# Patient Record
Sex: Female | Born: 1955 | Race: Black or African American | Hispanic: No | Smoking: Former smoker
Health system: Southern US, Community
[De-identification: ages and names within clinical notes are randomized; demographics above are authoritative.]

## PROBLEM LIST (undated history)

## (undated) ENCOUNTER — Inpatient Hospital Stay: Admission: AD | Payer: Medicare Other | Source: Ambulatory Visit | Admitting: Oncology

## (undated) DIAGNOSIS — I700 Atherosclerosis of aorta: Secondary | ICD-10-CM

## (undated) DIAGNOSIS — E113399 Type 2 diabetes mellitus with moderate nonproliferative diabetic retinopathy without macular edema, unspecified eye: Secondary | ICD-10-CM

## (undated) DIAGNOSIS — J441 Chronic obstructive pulmonary disease with (acute) exacerbation: Secondary | ICD-10-CM

## (undated) DIAGNOSIS — J301 Allergic rhinitis due to pollen: Secondary | ICD-10-CM

## (undated) DIAGNOSIS — I5022 Chronic systolic (congestive) heart failure: Secondary | ICD-10-CM

## (undated) DIAGNOSIS — E041 Nontoxic single thyroid nodule: Secondary | ICD-10-CM

## (undated) DIAGNOSIS — D3502 Benign neoplasm of left adrenal gland: Secondary | ICD-10-CM

## (undated) DIAGNOSIS — I2510 Atherosclerotic heart disease of native coronary artery without angina pectoris: Secondary | ICD-10-CM

## (undated) DIAGNOSIS — N814 Uterovaginal prolapse, unspecified: Secondary | ICD-10-CM

## (undated) DIAGNOSIS — Z8673 Personal history of transient ischemic attack (TIA), and cerebral infarction without residual deficits: Secondary | ICD-10-CM

## (undated) DIAGNOSIS — L02211 Cutaneous abscess of abdominal wall: Secondary | ICD-10-CM

## (undated) DIAGNOSIS — L409 Psoriasis, unspecified: Secondary | ICD-10-CM

## (undated) DIAGNOSIS — E663 Overweight: Secondary | ICD-10-CM

## (undated) DIAGNOSIS — K5730 Diverticulosis of large intestine without perforation or abscess without bleeding: Secondary | ICD-10-CM

## (undated) DIAGNOSIS — F17200 Nicotine dependence, unspecified, uncomplicated: Secondary | ICD-10-CM

## (undated) DIAGNOSIS — Z5189 Encounter for other specified aftercare: Secondary | ICD-10-CM

## (undated) DIAGNOSIS — E731 Secondary lactase deficiency: Secondary | ICD-10-CM

## (undated) DIAGNOSIS — E785 Hyperlipidemia, unspecified: Secondary | ICD-10-CM

## (undated) DIAGNOSIS — I10 Essential (primary) hypertension: Secondary | ICD-10-CM

## (undated) DIAGNOSIS — K219 Gastro-esophageal reflux disease without esophagitis: Secondary | ICD-10-CM

## (undated) DIAGNOSIS — K56609 Unspecified intestinal obstruction, unspecified as to partial versus complete obstruction: Secondary | ICD-10-CM

## (undated) HISTORY — DX: Cutaneous abscess of abdominal wall: L02.211

## (undated) HISTORY — DX: Secondary lactase deficiency: E73.1

## (undated) HISTORY — DX: Encounter for other specified aftercare: Z51.89

## (undated) HISTORY — DX: Diverticulosis of large intestine without perforation or abscess without bleeding: K57.30

## (undated) HISTORY — PX: TUBAL LIGATION: SHX77

## (undated) HISTORY — PX: SMALL INTESTINE SURGERY: SHX150

## (undated) HISTORY — DX: Chronic systolic (congestive) heart failure: I50.22

## (undated) HISTORY — DX: Nicotine dependence, unspecified, uncomplicated: F17.200

## (undated) HISTORY — DX: Atherosclerotic heart disease of native coronary artery without angina pectoris: I25.10

## (undated) HISTORY — DX: Atherosclerosis of aorta: I70.0

## (undated) HISTORY — DX: Psoriasis, unspecified: L40.9

## (undated) HISTORY — DX: Type 2 diabetes mellitus with moderate nonproliferative diabetic retinopathy without macular edema, unspecified eye: E11.3399

## (undated) HISTORY — DX: Personal history of transient ischemic attack (TIA), and cerebral infarction without residual deficits: Z86.73

## (undated) HISTORY — DX: Hyperlipidemia, unspecified: E78.5

## (undated) HISTORY — DX: Benign neoplasm of left adrenal gland: D35.02

## (undated) HISTORY — DX: Gastro-esophageal reflux disease without esophagitis: K21.9

## (undated) HISTORY — DX: Allergic rhinitis due to pollen: J30.1

## (undated) HISTORY — DX: Overweight: E66.3

## (undated) HISTORY — DX: Chronic obstructive pulmonary disease with (acute) exacerbation: J44.1

## (undated) HISTORY — DX: Essential (primary) hypertension: I10

---
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
## 2013-05-10 DIAGNOSIS — Z8673 Personal history of transient ischemic attack (TIA), and cerebral infarction without residual deficits: Secondary | ICD-10-CM

## 2013-05-10 HISTORY — DX: Personal history of transient ischemic attack (TIA), and cerebral infarction without residual deficits: Z86.73

## 2013-10-14 ENCOUNTER — Inpatient Hospital Stay (HOSPITAL_COMMUNITY)
Admission: EM | Admit: 2013-10-14 | Discharge: 2013-10-16 | DRG: 391 | Disposition: A | Discharge status: Home / Self Care | Payer: Medicaid Other | Attending: Internal Medicine | Admitting: Internal Medicine

## 2013-10-14 ENCOUNTER — Encounter (HOSPITAL_COMMUNITY): Payer: Self-pay | Admitting: Emergency Medicine

## 2013-10-14 DIAGNOSIS — E119 Type 2 diabetes mellitus without complications: Secondary | ICD-10-CM

## 2013-10-14 DIAGNOSIS — E279 Disorder of adrenal gland, unspecified: Secondary | ICD-10-CM | POA: Diagnosis present

## 2013-10-14 DIAGNOSIS — E860 Dehydration: Secondary | ICD-10-CM

## 2013-10-14 DIAGNOSIS — Z823 Family history of stroke: Secondary | ICD-10-CM

## 2013-10-14 DIAGNOSIS — IMO0002 Reserved for concepts with insufficient information to code with codable children: Secondary | ICD-10-CM

## 2013-10-14 DIAGNOSIS — A088 Other specified intestinal infections: Principal | ICD-10-CM | POA: Diagnosis present

## 2013-10-14 DIAGNOSIS — E43 Unspecified severe protein-calorie malnutrition: Secondary | ICD-10-CM

## 2013-10-14 DIAGNOSIS — I69959 Hemiplegia and hemiparesis following unspecified cerebrovascular disease affecting unspecified side: Secondary | ICD-10-CM

## 2013-10-14 DIAGNOSIS — N179 Acute kidney failure, unspecified: Secondary | ICD-10-CM | POA: Diagnosis present

## 2013-10-14 DIAGNOSIS — E113399 Type 2 diabetes mellitus with moderate nonproliferative diabetic retinopathy without macular edema, unspecified eye: Secondary | ICD-10-CM | POA: Diagnosis present

## 2013-10-14 DIAGNOSIS — R739 Hyperglycemia, unspecified: Secondary | ICD-10-CM

## 2013-10-14 DIAGNOSIS — E278 Other specified disorders of adrenal gland: Secondary | ICD-10-CM

## 2013-10-14 DIAGNOSIS — R109 Unspecified abdominal pain: Secondary | ICD-10-CM

## 2013-10-14 DIAGNOSIS — I10 Essential (primary) hypertension: Secondary | ICD-10-CM

## 2013-10-14 DIAGNOSIS — K529 Noninfective gastroenteritis and colitis, unspecified: Secondary | ICD-10-CM

## 2013-10-14 DIAGNOSIS — E873 Alkalosis: Secondary | ICD-10-CM

## 2013-10-14 DIAGNOSIS — Z794 Long term (current) use of insulin: Secondary | ICD-10-CM

## 2013-10-14 DIAGNOSIS — E785 Hyperlipidemia, unspecified: Secondary | ICD-10-CM | POA: Diagnosis present

## 2013-10-14 DIAGNOSIS — F17200 Nicotine dependence, unspecified, uncomplicated: Secondary | ICD-10-CM | POA: Diagnosis present

## 2013-10-14 DIAGNOSIS — Z8249 Family history of ischemic heart disease and other diseases of the circulatory system: Secondary | ICD-10-CM

## 2013-10-14 DIAGNOSIS — E876 Hypokalemia: Secondary | ICD-10-CM

## 2013-10-14 DIAGNOSIS — A059 Bacterial foodborne intoxication, unspecified: Secondary | ICD-10-CM | POA: Diagnosis present

## 2013-10-14 DIAGNOSIS — Z833 Family history of diabetes mellitus: Secondary | ICD-10-CM

## 2013-10-14 DIAGNOSIS — E871 Hypo-osmolality and hyponatremia: Secondary | ICD-10-CM

## 2013-10-14 DIAGNOSIS — I509 Heart failure, unspecified: Secondary | ICD-10-CM | POA: Diagnosis present

## 2013-10-14 LAB — CBC WITH DIFFERENTIAL/PLATELET
Basophils Absolute: 0 10*3/uL (ref 0.0–0.1)
Basophils Relative: 0 % (ref 0–1)
Eosinophils Absolute: 0 10*3/uL (ref 0.0–0.7)
HCT: 43 % (ref 36.0–46.0)
Hemoglobin: 15.4 g/dL — ABNORMAL HIGH (ref 12.0–15.0)
Lymphocytes Relative: 7 % — ABNORMAL LOW (ref 12–46)
Lymphs Abs: 0.8 10*3/uL (ref 0.7–4.0)
MCH: 28.8 pg (ref 26.0–34.0)
MCHC: 35.8 g/dL (ref 30.0–36.0)
Monocytes Absolute: 0.6 10*3/uL (ref 0.1–1.0)
Monocytes Relative: 5 % (ref 3–12)
Neutro Abs: 9 10*3/uL — ABNORMAL HIGH (ref 1.7–7.7)
Neutrophils Relative %: 87 % — ABNORMAL HIGH (ref 43–77)
WBC: 10.3 10*3/uL (ref 4.0–10.5)

## 2013-10-14 LAB — COMPREHENSIVE METABOLIC PANEL
AST: 17 U/L (ref 0–37)
Albumin: 4.3 g/dL (ref 3.5–5.2)
Alkaline Phosphatase: 71 U/L (ref 39–117)
BUN: 47 mg/dL — ABNORMAL HIGH (ref 6–23)
CO2: 39 mEq/L — ABNORMAL HIGH (ref 19–32)
Calcium: 10.7 mg/dL — ABNORMAL HIGH (ref 8.4–10.5)
Chloride: 76 mEq/L — ABNORMAL LOW (ref 96–112)
GFR calc non Af Amer: 30 mL/min — ABNORMAL LOW (ref >90)
Potassium: 3.6 mEq/L (ref 3.5–5.1)
Total Bilirubin: 0.5 mg/dL (ref 0.3–1.2)

## 2013-10-14 MED ORDER — ONDANSETRON 4 MG PO TBDP
8.0000 mg | ORAL_TABLET | Freq: Once | ORAL | Status: AC
  Administered 2013-10-14: 8 mg via ORAL
  Filled 2013-10-14: qty 2

## 2013-10-14 NOTE — ED Notes (Signed)
Pt states nausea and vomiting starting yesterday. Vomiting x 7 in last 24 hrs. Bilateral lower abdominal pain. LBM 11/19//2014

## 2013-10-14 NOTE — ED Provider Notes (Signed)
CSN: [id number]     Arrival date & time 10/14/13  2051 History   First MD Initiated Contact with Patient 10/14/13 2241     Chief Complaint  Patient presents with  . Emesis   (Consider location/radiation/quality/duration/timing/severity/associated sxs/prior Treatment) HPI Comments: Patient is a 57 yo F PMHx significant for DM, HLD, CHF, CVA, tobacco use presenting to the ED for two days of nausea, several episodes of non-bloody non-bilious emesis, and upper abdominal pain. Patient states that her symptoms began after eating oranges yesterday morning. Patient states her abdominal pain was initially generalized but was able to have a large loose bowel movement while in the emergency department and now her pain is localized to the upper abdomen. She states her last bowel movement was on Wednesday prior to today. She denies any chest pain, SOB, syncope. Patient's only abdominal surgical history includes tubal ligation. Patient is from [place], [place]. Recently moved here, does not have established care.   Patient is a 57 y.o. female presenting with vomiting.  Emesis Associated symptoms: abdominal pain   Associated symptoms: no chills     No past medical history on file. No past surgical history on file. No family history on file. History  Substance Use Topics  . Smoking status: Current Every Day Smoker -- 0.50 packs/day for 25 years    Types: Cigarettes  . Smokeless tobacco: Never Used  . Alcohol Use: No   OB History   Grav Para Term Preterm Abortions TAB SAB Ect Mult Living                 Review of Systems  Constitutional: Negative for fever and chills.  HENT: Negative.   Eyes: Negative.   Respiratory: Negative for shortness of breath.   Cardiovascular: Negative for chest pain.  Gastrointestinal: Positive for nausea, vomiting and abdominal pain.  Genitourinary: Negative.   Musculoskeletal: Negative.   Skin: Negative.   Neurological: Negative.     Allergies  Review of patient's  allergies indicates no known allergies.  Home Medications   Current Outpatient Rx  Name  Route  Sig  Dispense  Refill  . ibuprofen (ADVIL,MOTRIN) 200 MG tablet   Oral   Take 600 mg by mouth every 6 (six) hours as needed for mild pain.         Marland Kitchen insulin aspart (NOVOLOG) 100 UNIT/ML injection   Subcutaneous   Inject 4.5 Units into the skin at bedtime. Prescribed 3 times daily, but takes only once because she is afraid of running out         . insulin glargine (LANTUS) 100 UNIT/ML injection   Subcutaneous   Inject 5 Units into the skin at bedtime.          BP 111/61  Pulse 99  Temp(Src) 97.5 F (36.4 C) (Oral)  Resp 18  SpO2 97% Physical Exam  Constitutional: She is oriented to person, place, and time. She appears well-developed and well-nourished. No distress.  HENT:  Head: Normocephalic and atraumatic.  Right Ear: External ear normal.  Left Ear: External ear normal.  Nose: Nose normal.  Eyes: Conjunctivae are normal.  Neck: Neck supple.  Cardiovascular: Normal rate, regular rhythm and normal heart sounds.   Pulmonary/Chest: Effort normal and breath sounds normal.  Abdominal: Soft. Bowel sounds are normal. She exhibits no distension. There is tenderness in the right upper quadrant, epigastric area and left upper quadrant. There is no rigidity, no rebound, no guarding and no CVA tenderness.    Musculoskeletal: She exhibits  no edema.  Neurological: She is alert and oriented to person, place, and time.  Skin: Skin is warm and dry. She is not diaphoretic.    ED Course  Procedures (including critical care time) Medications  0.9 %  sodium chloride infusion (not administered)    Followed by  0.9 %  sodium chloride infusion (not administered)    Followed by  0.9 %  sodium chloride infusion (not administered)  insulin regular (NOVOLIN R,HUMULIN R) 1 Units/mL in sodium chloride 0.9 % 100 mL infusion (3.1 Units/hr Intravenous New Bag/Given 10/15/13 0107)  dextrose 5  %-0.45 % sodium chloride infusion (not administered)  ondansetron (ZOFRAN-ODT) disintegrating tablet 8 mg (8 mg Oral Given 10/14/13 2245)  sodium chloride 0.9 % bolus 1,000 mL (1,000 mLs Intravenous New Bag/Given 10/15/13 0107)    Labs Review Labs Reviewed  CBC WITH DIFFERENTIAL - Abnormal; Notable for the following:    RBC 5.34 (*)    Hemoglobin 15.4 (*)    Neutrophils Relative % 87 (*)    Neutro Abs 9.0 (*)    Lymphocytes Relative 7 (*)    All other components within normal limits  COMPREHENSIVE METABOLIC PANEL - Abnormal; Notable for the following:    Sodium 130 (*)    Chloride 76 (*)    CO2 39 (*)    Glucose, Bld 371 (*)    BUN 47 (*)    Creatinine, Ser 1.83 (*)    Calcium 10.7 (*)    GFR calc non Af Amer 30 (*)    GFR calc Af Amer 35 (*)    All other components within normal limits  LIPASE, BLOOD  TROPONIN I  URINALYSIS, ROUTINE W REFLEX MICROSCOPIC  KETONES, QUALITATIVE   Imaging Review Ct Abdomen Pelvis Wo Contrast  10/15/2013   CLINICAL DATA:  Nausea, vomiting, bilateral lower abdomen pain  EXAM: CT ABDOMEN AND PELVIS WITHOUT CONTRAST  TECHNIQUE: Multidetector CT imaging of the abdomen and pelvis was performed following the standard protocol without intravenous contrast.  COMPARISON:  None.  FINDINGS: There is no nephrolithiasis or hydroureteronephrosis bilaterally. The liver, spleen, gallbladder, pancreas are normal. The right adrenal gland is normal. There is a 1.6 x 2.8 cm mass in the left adrenal gland with Hounsfield unit 29, not typical for adrenal adenoma. There is atherosclerosis of the abdominal aorta without aneurysmal dilatation. There is no abdominal lymphadenopathy. There dilated small bowel loops in the abdomen and pelvis. There is diverticulosis of colon without diverticulitis.  Fluid-filled bladder is normal. The uterus is normal. Small amount of free fluid is noted in the pelvis. No focal pneumonia or pleural effusion is identified in the visualized lung  bases. Degenerative joint changes of the spine are noted.  IMPRESSION: Dilated small bowel loops in the abdomen and pelvis. Findings could be due to ileus. Followup abdominal x-ray is recommended.  Mass in the left adrenal gland not typical for adrenal adenoma. If clinically indicated, evaluation with MRI may be helpful.   Electronically Signed   By: Sherian Rein M.D.   On: 10/15/2013 00:40    EKG Interpretation    Date/Time:  Sunday October 15 2013 00:27:57 EST Ventricular Rate:  99 PR Interval:  113 QRS Duration: 96 QT Interval:  381 QTC Calculation: 489 R Axis:   23 Text Interpretation:  Age not entered, assumed to be  57 years old for purpose of ECG interpretation Sinus rhythm Biatrial enlargement RSR' in V1 or V2, right VCD or RVH LVH with secondary repolarization abnormality Abnormal T, consider  ischemia, inferior leads No previous ECGs available Confirmed by [facility]  MD, JOHN ([id number]) on 10/15/2013 12:47:00 AM            MDM   1. Dehydration   2. Abdominal pain   3. Metabolic alkalosis   4. Hyperglycemia     Afebrile, NAD, non-toxic appearing, AAOx4. RUQ and epigastric tenderness. Metabolic alkalosis noted on laboratory findings. EKG reviewed, T wave abnormalities but negative troponin no previous EKG review. CT scan reviewed patient will require MRI for further evaluation of adrenal mass. I have reviewed nursing notes, vital signs, and all appropriate lab and imaging results for this patient. Patient will be admitted for observation. Patient d/w with Dr. Fonnie Jarvis, agrees with plan.         Jeannetta Ellis, PA-C 10/15/13 0112

## 2013-10-14 NOTE — ED Notes (Signed)
Pt had severe episode of uncontrolled diarrhea. Pt changed and cleaned. Linens changed as well.

## 2013-10-15 ENCOUNTER — Emergency Department (HOSPITAL_COMMUNITY): Payer: Medicaid Other

## 2013-10-15 ENCOUNTER — Encounter (HOSPITAL_COMMUNITY): Payer: Self-pay | Admitting: Internal Medicine

## 2013-10-15 DIAGNOSIS — E876 Hypokalemia: Secondary | ICD-10-CM

## 2013-10-15 DIAGNOSIS — E873 Alkalosis: Secondary | ICD-10-CM

## 2013-10-15 DIAGNOSIS — R109 Unspecified abdominal pain: Secondary | ICD-10-CM

## 2013-10-15 DIAGNOSIS — I10 Essential (primary) hypertension: Secondary | ICD-10-CM

## 2013-10-15 DIAGNOSIS — E860 Dehydration: Secondary | ICD-10-CM | POA: Diagnosis present

## 2013-10-15 DIAGNOSIS — R7309 Other abnormal glucose: Secondary | ICD-10-CM

## 2013-10-15 DIAGNOSIS — E113399 Type 2 diabetes mellitus with moderate nonproliferative diabetic retinopathy without macular edema, unspecified eye: Secondary | ICD-10-CM | POA: Diagnosis present

## 2013-10-15 DIAGNOSIS — E119 Type 2 diabetes mellitus without complications: Secondary | ICD-10-CM

## 2013-10-15 DIAGNOSIS — K5289 Other specified noninfective gastroenteritis and colitis: Secondary | ICD-10-CM

## 2013-10-15 DIAGNOSIS — E871 Hypo-osmolality and hyponatremia: Secondary | ICD-10-CM | POA: Diagnosis present

## 2013-10-15 DIAGNOSIS — R739 Hyperglycemia, unspecified: Secondary | ICD-10-CM | POA: Diagnosis present

## 2013-10-15 DIAGNOSIS — K529 Noninfective gastroenteritis and colitis, unspecified: Secondary | ICD-10-CM | POA: Diagnosis present

## 2013-10-15 HISTORY — DX: Essential (primary) hypertension: I10

## 2013-10-15 HISTORY — DX: Type 2 diabetes mellitus with moderate nonproliferative diabetic retinopathy without macular edema, unspecified eye: E11.3399

## 2013-10-15 LAB — URINALYSIS, ROUTINE W REFLEX MICROSCOPIC
Glucose, UA: NEGATIVE mg/dL
Hgb urine dipstick: NEGATIVE
Ketones, ur: NEGATIVE mg/dL
Protein, ur: 100 mg/dL — AB
Urobilinogen, UA: 1 mg/dL (ref 0.0–1.0)
pH: 6.5 (ref 5.0–8.0)

## 2013-10-15 LAB — BASIC METABOLIC PANEL
BUN: 48 mg/dL — ABNORMAL HIGH (ref 6–23)
CO2: 38 mEq/L — ABNORMAL HIGH (ref 19–32)
Calcium: 9.4 mg/dL (ref 8.4–10.5)
GFR calc non Af Amer: 38 mL/min — ABNORMAL LOW (ref >90)
Glucose, Bld: 88 mg/dL (ref 70–99)
Potassium: 2.8 mEq/L — ABNORMAL LOW (ref 3.5–5.1)

## 2013-10-15 LAB — CBC
Hemoglobin: 14 g/dL (ref 12.0–15.0)
MCH: 28.2 pg (ref 26.0–34.0)
MCHC: 34.6 g/dL (ref 30.0–36.0)
MCV: 81.7 fL (ref 78.0–100.0)
Platelets: 285 10*3/uL (ref 150–400)
RBC: 4.96 MIL/uL (ref 3.87–5.11)

## 2013-10-15 LAB — GLUCOSE, CAPILLARY
Glucose-Capillary: 131 mg/dL — ABNORMAL HIGH (ref 70–99)
Glucose-Capillary: 152 mg/dL — ABNORMAL HIGH (ref 70–99)

## 2013-10-15 LAB — CLOSTRIDIUM DIFFICILE BY PCR: Toxigenic C. Difficile by PCR: NEGATIVE

## 2013-10-15 LAB — URINE MICROSCOPIC-ADD ON

## 2013-10-15 LAB — MAGNESIUM: Magnesium: 2.1 mg/dL (ref 1.5–2.5)

## 2013-10-15 MED ORDER — ACETAMINOPHEN 650 MG RE SUPP: 650.0000 mg | Freq: Four times a day (QID) | RECTAL | Status: DC | PRN

## 2013-10-15 MED ORDER — ZOLPIDEM TARTRATE 5 MG PO TABS: 5.0000 mg | ORAL_TABLET | Freq: Every evening | ORAL | Status: DC | PRN

## 2013-10-15 MED ORDER — INSULIN ASPART 100 UNIT/ML ~~LOC~~ SOLN
0.0000 [IU] | SUBCUTANEOUS | Status: DC
  Administered 2013-10-15: 1 [IU] via SUBCUTANEOUS

## 2013-10-15 MED ORDER — HYDROMORPHONE HCL PF 1 MG/ML IJ SOLN: 0.5000 mg | INTRAMUSCULAR | Status: DC | PRN

## 2013-10-15 MED ORDER — SODIUM CHLORIDE 0.9 % IV SOLN: 1000.0000 mL | INTRAVENOUS | Status: DC

## 2013-10-15 MED ORDER — SODIUM CHLORIDE 0.9 % IV SOLN: 1000.0000 mL | Freq: Once | INTRAVENOUS | Status: DC

## 2013-10-15 MED ORDER — INSULIN ASPART 100 UNIT/ML ~~LOC~~ SOLN
0.0000 [IU] | Freq: Three times a day (TID) | SUBCUTANEOUS | Status: DC
  Administered 2013-10-15 (×2): 2 [IU] via SUBCUTANEOUS
  Administered 2013-10-16 (×2): 1 [IU] via SUBCUTANEOUS

## 2013-10-15 MED ORDER — OXYCODONE HCL 5 MG PO TABS: 5.0000 mg | ORAL_TABLET | ORAL | Status: DC | PRN

## 2013-10-15 MED ORDER — ONDANSETRON HCL 4 MG PO TABS: 4.0000 mg | ORAL_TABLET | Freq: Four times a day (QID) | ORAL | Status: DC | PRN

## 2013-10-15 MED ORDER — INFLUENZA VAC SPLIT QUAD 0.5 ML IM SUSP
0.5000 mL | INTRAMUSCULAR | Status: AC
  Administered 2013-10-16: 0.5 mL via INTRAMUSCULAR
  Filled 2013-10-15 (×2): qty 0.5

## 2013-10-15 MED ORDER — INSULIN REGULAR HUMAN 100 UNIT/ML IJ SOLN
INTRAMUSCULAR | Status: DC
  Administered 2013-10-15: 3.1 [IU]/h via INTRAVENOUS
  Filled 2013-10-15: qty 1

## 2013-10-15 MED ORDER — SODIUM CHLORIDE 0.9 % IV BOLUS (SEPSIS)
1000.0000 mL | Freq: Once | INTRAVENOUS | Status: AC
  Administered 2013-10-15: 1000 mL via INTRAVENOUS

## 2013-10-15 MED ORDER — DEXTROSE-NACL 5-0.45 % IV SOLN: INTRAVENOUS | Status: DC

## 2013-10-15 MED ORDER — ACETAMINOPHEN 325 MG PO TABS: 650.0000 mg | ORAL_TABLET | Freq: Four times a day (QID) | ORAL | Status: DC | PRN

## 2013-10-15 MED ORDER — ONDANSETRON HCL 4 MG/2ML IJ SOLN: 4.0000 mg | Freq: Four times a day (QID) | INTRAMUSCULAR | Status: DC | PRN

## 2013-10-15 MED ORDER — POTASSIUM CHLORIDE 10 MEQ/100ML IV SOLN
10.0000 meq | INTRAVENOUS | Status: AC
  Administered 2013-10-15 (×2): 10 meq via INTRAVENOUS
  Filled 2013-10-15 (×2): qty 100

## 2013-10-15 MED ORDER — ENOXAPARIN SODIUM 40 MG/0.4ML ~~LOC~~ SOLN
40.0000 mg | SUBCUTANEOUS | Status: DC
  Administered 2013-10-16: 40 mg via SUBCUTANEOUS
  Filled 2013-10-15 (×2): qty 0.4

## 2013-10-15 MED ORDER — ALUM & MAG HYDROXIDE-SIMETH 200-200-20 MG/5ML PO SUSP: 30.0000 mL | Freq: Four times a day (QID) | ORAL | Status: DC | PRN

## 2013-10-15 MED ORDER — INSULIN GLARGINE 100 UNIT/ML ~~LOC~~ SOLN
5.0000 [IU] | Freq: Every day | SUBCUTANEOUS | Status: DC
  Administered 2013-10-15 (×2): 5 [IU] via SUBCUTANEOUS
  Filled 2013-10-15 (×3): qty 0.05

## 2013-10-15 MED ORDER — ENOXAPARIN SODIUM 30 MG/0.3ML ~~LOC~~ SOLN
30.0000 mg | SUBCUTANEOUS | Status: DC
  Administered 2013-10-15: 30 mg via SUBCUTANEOUS
  Filled 2013-10-15 (×2): qty 0.3

## 2013-10-15 MED ORDER — SODIUM CHLORIDE 0.9 % IV SOLN
INTRAVENOUS | Status: DC
  Administered 2013-10-15 – 2013-10-16 (×3): via INTRAVENOUS

## 2013-10-15 MED ORDER — POTASSIUM CHLORIDE CRYS ER 20 MEQ PO TBCR
40.0000 meq | EXTENDED_RELEASE_TABLET | Freq: Two times a day (BID) | ORAL | Status: AC
  Administered 2013-10-15 (×2): 40 meq via ORAL
  Filled 2013-10-15 (×2): qty 2

## 2013-10-15 NOTE — Progress Notes (Addendum)
[facility] PROGRESS NOTE  Alicia [name] ZOX:[id number] DOB: May 25, 1956 DOA: 10/14/2013 PCP: Provider Not In System  Assessment/Plan  Nausea, vomiting, and diarrhea, possibly secondary to gastroenteritis, either viral or bacterial, resolving -  C. Diff neg -  Stool culture pending -  Continue IVF -  F/u urine culture  AKI, likely secondary to dehydration, BUN and creatinine trending down, unknown baseline -  Continue IVF -  Continue to hold ACEI -  Minimize nephrotoxins   T2DM, CBG well controlled, A1c 9.1 -  Continue Lantus 5 units with low dose SSI  Hyponatremia resolved with hydration Hypokalemia, likely due to vomiting and diarrhea, oral and IV KCl.  Magnesium wnl Elevated bicarb, may be due to dehydration and profuse vomiting, no underlying respiratory disease - continue hydration  HTN, BP stable.   -  Continue to hold ACEI   CHF with unknown etiology and EF.  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   -  Follow up labs and MRI as outpatient   Diet:  Advance as tolerated Access:  PIV IVF:  yes Proph:  lovenox  Code Status: full Family Communication: patient  Disposition Plan: pending further improvement in AKI and diarrhea, tolerating liquids/foods okay   Consultants:  none  Procedures:  CT abd/pelvis  Antibiotics:  none   HPI/Subjective:  Abdominal pain already somewhat better.  Nausea also improved.  Still having diarrhea, but may be becoming more formed   Objective: Filed Vitals:   10/15/13 0134 10/15/13 0244 10/15/13 0810 10/15/13 1415  BP:  123/81 125/80 135/77  Pulse:  100 90 86  Temp: 98.4 F (36.9 C) 97.3 F (36.3 C) 98.3 F (36.8 C) 98.5 F (36.9 C)  TempSrc: Oral Oral Oral Oral  Resp:  18 18 18   Height: 5\' 7"  (1.702 m)     Weight:  56.337 kg (124 lb 3.2  oz)    SpO2:  93% 95% 96%    Intake/Output Summary (Last 24 hours) at 10/15/13 1610 Last data filed at 10/15/13 1519  Gross per 24 hour  Intake   1880 ml  Output    255 ml  Net   1625 ml   Filed Weights   10/15/13 0244  Weight: 56.337 kg (124 lb 3.2 oz)    Exam:   General:  AAF, No acute distress  HEENT:  NCAT, MMM  Cardiovascular:  RRR, nl S1, S2 no mrg, 2+ pulses, warm extremities  Respiratory:  CTAB, no increased WOB  Abdomen:   NABS, soft, mildly distended, mild TTP diffusely without rebound or guarding  MSK:   Normal tone and bulk, no LEE  Neuro:  Right hemiplegia  Data Reviewed: Basic Metabolic Panel:  Recent Labs Lab 10/14/13 2140 10/15/13 0620  NA 130* 138  K 3.6 2.8*  CL 76* 88*  CO2 39* 38*  GLUCOSE 371* 88  BUN 47* 48*  CREATININE 1.83* 1.50*  CALCIUM 10.7* 9.4  MG  --  2.1   Liver Function Tests:  Recent Labs Lab 10/14/13 2140  AST 17  ALT 12  ALKPHOS 71  BILITOT 0.5  PROT 8.1  ALBUMIN 4.3    Recent Labs Lab 10/14/13 2140  LIPASE 13   No results found for this basename: AMMONIA,  in the last 168 hours CBC:  Recent Labs Lab 10/14/13 2140  10/15/13 0620  WBC 10.3 9.0  NEUTROABS 9.0*  --   HGB 15.4* 14.0  HCT 43.0 40.5  MCV 80.5 81.7  PLT 299 285   Cardiac Enzymes:  Recent Labs Lab 10/14/13 2330  TROPONINI <0.30   BNP (last 3 results) No results found for this basename: PROBNP,  in the last 8760 hours CBG:  Recent Labs Lab 10/15/13 0251 10/15/13 0730 10/15/13 1117  GLUCAP 131* 111* 152*    Recent Results (from the past 240 hour(s))  CLOSTRIDIUM DIFFICILE BY PCR     Status: None   Collection Time    10/15/13  7:45 AM      Result Value Range Status   C difficile by pcr NEGATIVE  NEGATIVE Final     Studies: Ct Abdomen Pelvis Wo Contrast  10/15/2013   CLINICAL DATA:  Nausea, vomiting, bilateral lower abdomen pain  EXAM: CT ABDOMEN AND PELVIS WITHOUT CONTRAST  TECHNIQUE: Multidetector CT imaging of the  abdomen and pelvis was performed following the standard protocol without intravenous contrast.  COMPARISON:  None.  FINDINGS: There is no nephrolithiasis or hydroureteronephrosis bilaterally. The liver, spleen, gallbladder, pancreas are normal. The right adrenal gland is normal. There is a 1.6 x 2.8 cm mass in the left adrenal gland with Hounsfield unit 29, not typical for adrenal adenoma. There is atherosclerosis of the abdominal aorta without aneurysmal dilatation. There is no abdominal lymphadenopathy. There dilated small bowel loops in the abdomen and pelvis. There is diverticulosis of colon without diverticulitis.  Fluid-filled bladder is normal. The uterus is normal. Small amount of free fluid is noted in the pelvis. No focal pneumonia or pleural effusion is identified in the visualized lung bases. Degenerative joint changes of the spine are noted.  IMPRESSION: Dilated small bowel loops in the abdomen and pelvis. Findings could be due to ileus. Followup abdominal x-ray is recommended.  Mass in the left adrenal gland not typical for adrenal adenoma. If clinically indicated, evaluation with MRI may be helpful.   Electronically Signed   By: Sherian Rein M.D.   On: 10/15/2013 00:40    Scheduled Meds: . enoxaparin (LOVENOX) injection  30 mg Subcutaneous Q24H  . [START ON 10/16/2013] influenza vac split quadrivalent PF  0.5 mL Intramuscular Tomorrow-1000  . insulin aspart  0-9 Units Subcutaneous TID WC  . insulin glargine  5 Units Subcutaneous QHS  . potassium chloride  40 mEq Oral BID   Continuous Infusions: . sodium chloride 100 mL/hr at 10/15/13 0300    Principal Problem:   Dehydration Active Problems:   Gastroenteritis, acute   Hyperglycemia   Hyponatremia   Diabetes mellitus, type 2   Hypertension    Time spent: 30 min    Pike Scantlebury, [facility]  [facility] Pager [phone]. If 7PM-7AM, please contact night-coverage at [email or web address], password [facility] 10/15/2013, 4:10 PM  LOS: 1 day

## 2013-10-15 NOTE — Progress Notes (Signed)
Called ED spoke with Dessie Coma, RN unavailable to give report. Advised to call back.

## 2013-10-15 NOTE — ED Notes (Signed)
Patient shivering. Temp is 98.4. Blankets given

## 2013-10-15 NOTE — ED Provider Notes (Signed)
Medical screening examination/treatment/procedure(s) were conducted as a shared visit with non-physician practitioner(s) and myself.  I personally evaluated the patient during the encounter.  EKG Interpretation    Date/Time:  Sunday October 15 2013 00:27:57 EST Ventricular Rate:  99 PR Interval:  113 QRS Duration: 96 QT Interval:  381 QTC Calculation: 489 R Axis:   23 Text Interpretation:  Age not entered, assumed to be  57 years old for purpose of ECG interpretation Sinus rhythm Biatrial enlargement RSR' in V1 or V2, right VCD or RVH LVH with secondary repolarization abnormality Abnormal T, consider ischemia, inferior leads No previous ECGs available Confirmed by [facility]  MD, Ronita Hargreaves ([id number]) on 10/15/2013 12:47:00 AM           NVD x 2 days, Pt feels dehydrated, has hyperglycemia, acute renal insufficiency (no PMH of CKD), suspect mild DKA.  Hurman Horn, MD 10/15/13 1302

## 2013-10-15 NOTE — H&P (Addendum)
[facility] History and Physical  Rhaya May Anderle ZOX:[id number] DOB: 1956/04/01 DOA: 10/14/2013  Referring physician:   EDP PCP: Provider Not In System  Specialists:   Chief Complaint:  Nausea and Vomiting since yesterday now Diarrhea  HPI: Alicia [name] is a 57 y.o. female with a hx of Type II Diabetes who presents to the ED with complaints of nausea and vomiting since yesterday afternoon, a few hours after eating questionably bad oranges.  She and her grand-daughter a 62 year old became sick.  She report that she has continued to have N+V , and then PTA she began to have diarrhea.  She reports having 4 episodes of diarrhea since coming into the ED.  She reports having fevers and chills, and lower ABD Pain.     Review of Systems: The patient denies anorexia, headaches, weight loss,, vision loss, diplopia, dizziness, decreased hearing, rhinitis, hoarseness, chest pain, syncope, dyspnea on exertion, peripheral edema, balance deficits, cough, hemoptysis, constipation, hematemesis, melena, hematochezia, severe indigestion/heartburn, dysuria, hematuria, incontinence, suspicious skin lesions, transient blindness, difficulty walking, depression, unusual weight change, abnormal bleeding, enlarged lymph nodes, angioedema, and breast masses.    Past Medical History  Diagnosis Date  . Diabetes mellitus, type II   . Hypertension   . H/O: CVA (cerebrovascular accident)   . CHF (congestive heart failure)     Past Surgical History  Procedure Laterality Date  . Tubal ligation    . Cardiac catheterization      Prior to Admission medications   Medication Sig Start Date End Date Taking? Authorizing Provider  ibuprofen (ADVIL,MOTRIN) 200 MG tablet Take 600 mg by mouth every 6 (six) hours as needed for mild pain.   Yes Historical Provider, MD  insulin aspart (NOVOLOG) 100 UNIT/ML injection Inject 4.5 Units into the skin at bedtime. Prescribed 3 times daily, but takes only once because she is  afraid of running out   Yes Historical Provider, MD  insulin glargine (LANTUS) 100 UNIT/ML injection Inject 5 Units into the skin at bedtime.   Yes Historical Provider, MD    No Known Allergies  Social History:  From [place] , recently moved to [facility].     reports that she has been smoking Cigarettes.  She has a 12.5 pack-year smoking history. She has never used smokeless tobacco. She reports that she does not drink alcohol or use illicit drugs.     Family History  Problem Relation Age of Onset  . Diabetes Mellitus II Mother   . Hypertension Mother   . CVA Mother        Physical Exam:  GEN:  Pleasant  Mildly Overweight 57 y.o.  African American female  examined  and in no acute distress; cooperative with exam Filed Vitals:   10/14/13 2102 10/15/13 0000 10/15/13 0130 10/15/13 0134  BP: 99/71 111/61 157/101   Pulse: 115 99 101   Temp: 97.5 F (36.4 C)   98.4 F (36.9 C)  TempSrc: Oral   Oral  Resp: 18 18 18    SpO2: 95% 97% 100%    Blood pressure 157/101, pulse 101, temperature 98.4 F (36.9 C), temperature source Oral, resp. rate 18, SpO2 100.00%. PSYCH: SHe is alert and oriented x4; does not appear anxious does not appear depressed; affect is normal HEENT: Normocephalic and Atraumatic, Mucous membranes pink; PERRLA; EOM intact; Fundi:  Benign;  No scleral icterus, Nares: Patent, Oropharynx: Clear, Fair Dentition, Neck:  FROM, no cervical lymphadenopathy nor thyromegaly or carotid bruit; no JVD; Breasts:: Not examined  CHEST WALL: No tenderness CHEST: Normal respiration, clear to auscultation bilaterally HEART: Regular rate and rhythm; no murmurs rubs or gallops BACK: No kyphosis or scoliosis; no CVA tenderness ABDOMEN: Positive Bowel Sounds, Obese, soft non-tender; no masses, no organomegaly. Rectal Exam: Not done EXTREMITIES: No cyanosis, clubbing or edema; no ulcerations. Genitalia: not examined PULSES: 2+ and symmetric SKIN: Normal hydration no rash or  ulceration CNS: Cranial nerves 2-12 grossly intact no focal neurologic deficit    Labs on Admission:  Basic Metabolic Panel:  Recent Labs Lab 10/14/13 2140  NA 130*  K 3.6  CL 76*  CO2 39*  GLUCOSE 371*  BUN 47*  CREATININE 1.83*  CALCIUM 10.7*   Liver Function Tests:  Recent Labs Lab 10/14/13 2140  AST 17  ALT 12  ALKPHOS 71  BILITOT 0.5  PROT 8.1  ALBUMIN 4.3    Recent Labs Lab 10/14/13 2140  LIPASE 13   No results found for this basename: AMMONIA,  in the last 168 hours CBC:  Recent Labs Lab 10/14/13 2140  WBC 10.3  NEUTROABS 9.0*  HGB 15.4*  HCT 43.0  MCV 80.5  PLT 299   Cardiac Enzymes:  Recent Labs Lab 10/14/13 2330  TROPONINI <0.30    BNP (last 3 results) No results found for this basename: PROBNP,  in the last 8760 hours CBG: No results found for this basename: GLUCAP,  in the last 168 hours  Radiological Exams on Admission: Ct Abdomen Pelvis Wo Contrast  10/15/2013   CLINICAL DATA:  Nausea, vomiting, bilateral lower abdomen pain  EXAM: CT ABDOMEN AND PELVIS WITHOUT CONTRAST  TECHNIQUE: Multidetector CT imaging of the abdomen and pelvis was performed following the standard protocol without intravenous contrast.  COMPARISON:  None.  FINDINGS: There is no nephrolithiasis or hydroureteronephrosis bilaterally. The liver, spleen, gallbladder, pancreas are normal. The right adrenal gland is normal. There is a 1.6 x 2.8 cm mass in the left adrenal gland with Hounsfield unit 29, not typical for adrenal adenoma. There is atherosclerosis of the abdominal aorta without aneurysmal dilatation. There is no abdominal lymphadenopathy. There dilated small bowel loops in the abdomen and pelvis. There is diverticulosis of colon without diverticulitis.  Fluid-filled bladder is normal. The uterus is normal. Small amount of free fluid is noted in the pelvis. No focal pneumonia or pleural effusion is identified in the visualized lung bases. Degenerative joint  changes of the spine are noted.  IMPRESSION: Dilated small bowel loops in the abdomen and pelvis. Findings could be due to ileus. Followup abdominal x-ray is recommended.  Mass in the left adrenal gland not typical for adrenal adenoma. If clinically indicated, evaluation with MRI may be helpful.   Electronically Signed   By: Sherian Rein M.D.   On: 10/15/2013 00:40      Assessment/Plan Principal Problem:   Dehydration Active Problems:   Gastroenteritis, acute   Hyperglycemia   Hyponatremia   Diabetes mellitus, type 2   Hypertension   Ileus   1.  Dehydration- from acute Gastroenteritis,  IVFs ordered and monitor Electrolytes and BUN/Cr.    2.  Acute Gastroenteritis and Ileus-  Supportive Care, IVFs, and Anti-emetics PRN, Send Stool for C+S and C.diff PCR.    3.  Hyperglycemia with DM2-  Continue Lantus Insulin , add SSI coverage.  Check HbA1C.    4.  Hyponatremia-  IVFs with NSS, MOnitor Na+ levels.    5.  HTN-  Monitor BPs and initiate Rx, Ace Inhibitor if BUN/Cr improves.  6.   DVT prophylaxis with Lovenox.      Code Status:    FULL CODE Family Communication:    No Family Present Disposition Plan:     Inpatient    Time spent:  37 Minutes  Ron Parker [facility] Pager [phone]  If 7PM-7AM, please contact night-coverage [email or web address] Password [email or web address] 10/15/2013, 2:03 AM

## 2013-10-15 NOTE — Progress Notes (Signed)
Called and spoke with ED RN, report given & all questions answered.

## 2013-10-16 DIAGNOSIS — K5289 Other specified noninfective gastroenteritis and colitis: Secondary | ICD-10-CM

## 2013-10-16 DIAGNOSIS — E43 Unspecified severe protein-calorie malnutrition: Secondary | ICD-10-CM | POA: Insufficient documentation

## 2013-10-16 DIAGNOSIS — E278 Other specified disorders of adrenal gland: Secondary | ICD-10-CM

## 2013-10-16 DIAGNOSIS — E119 Type 2 diabetes mellitus without complications: Secondary | ICD-10-CM

## 2013-10-16 DIAGNOSIS — E279 Disorder of adrenal gland, unspecified: Secondary | ICD-10-CM

## 2013-10-16 LAB — BASIC METABOLIC PANEL
BUN: 28 mg/dL — ABNORMAL HIGH (ref 6–23)
CO2: 33 mEq/L — ABNORMAL HIGH (ref 19–32)
Calcium: 9.3 mg/dL (ref 8.4–10.5)
Chloride: 94 mEq/L — ABNORMAL LOW (ref 96–112)
Creatinine, Ser: 0.61 mg/dL (ref 0.50–1.10)
GFR calc Af Amer: >90 mL/min (ref >90)
GFR calc non Af Amer: >90 mL/min (ref >90)
Glucose, Bld: 128 mg/dL — ABNORMAL HIGH (ref 70–99)
Potassium: 4.4 mEq/L (ref 3.5–5.1)
Sodium: 134 mEq/L — ABNORMAL LOW (ref 135–145)

## 2013-10-16 LAB — GLUCOSE, CAPILLARY
Glucose-Capillary: 137 mg/dL — ABNORMAL HIGH (ref 70–99)
Glucose-Capillary: 134 mg/dL — ABNORMAL HIGH (ref 70–99)

## 2013-10-16 MED ORDER — PROMETHAZINE HCL 12.5 MG PO TABS: 12.5000 mg | ORAL_TABLET | Freq: Four times a day (QID) | ORAL | Status: DC | PRN

## 2013-10-16 NOTE — Progress Notes (Signed)
   CARE MANAGEMENT NOTE 10/16/2013  Patient:  Alicia [name], Alicia [name]   Account Number:  [id number]  Date Initiated:  10/16/2013  Documentation initiated by:  Darlyne Russian  Subjective/Objective Assessment:   admitted with c/o nausea, vomiting  self-pay, no PCP.     Action/Plan:   Progression of care and discharge planning.  Schedule f/u appointment.   Anticipated DC Date:  10/16/2013   Anticipated DC Plan:  HOME/SELF CARE      DC Planning Services  CM consult      Choice offered to / List presented to:             Status of service:  Completed, signed off Medicare Important Message given?   (If response is "NO", the following Medicare IM given date fields will be blank) Date Medicare IM given:   Date Additional Medicare IM given:    Discharge Disposition:  HOME/SELF CARE  Per UR Regulation:    If discussed at Long Length of Stay Meetings, dates discussed:    Comments:  10/16/2013  [address] RN, [place]  [phone] CM referral:  need PCP  spoke with patient regarding medical follow up post discharge. She is new to the area, staying with her daughter, does not have an MD in the area.  Verified contact #:  [phone]  [facility] [phone] left voice mail message requesting f/u appointment.

## 2013-10-16 NOTE — Progress Notes (Signed)
OT Cancellation Note and Discharge  Patient Details Name: Alicia [name] MRN: [id number] DOB: 16-Nov-1956   Cancelled Treatment:    Reason Eval/Treat Not Completed: OT screened, no needs identified, will sign off. 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 No OT needs identified, we will sign off.  Evette Georges [phone] 10/16/2013, 2:05 PM

## 2013-10-16 NOTE — Discharge Summary (Addendum)
Physician Discharge Summary  [facility] May Leidy MVH:[id number] DOB: 16-Jun-1956 DOA: 10/14/2013  PCP: Provider Not In System  Admit date: 10/14/2013 Discharge date: 10/16/2013  Recommendations for Outpatient Follow-up:  1. Follow up with primary care doctor in 1 week of discharge for repeat bloodwork and to follow up on the pending urine culture and stool culture.  Repeat BMP to ensure kidney function is stable.   2. Left adrenal mass needs further work up for malignancy, including MRI.  Further work up per PCP.     Discharge Diagnoses:  Principal Problem:   Gastroenteritis, acute Active Problems:   Dehydration   Hyperglycemia   Hyponatremia   Diabetes mellitus, type 2   Hypertension   Hypokalemia   Protein-calorie malnutrition, severe   Mass of left adrenal gland   Discharge Condition: stable, improved  Diet recommendation: diabetic  Wt Readings from Last 3 Encounters:  10/15/13 58.469 kg (128 lb 14.4 oz)    History of present illness:  Alicia [name] is a 57 y.o. female with a hx of Type II Diabetes who presents to the ED with complaints of nausea and vomiting since yesterday afternoon, a few hours after eating questionably bad oranges. She and her grand-daughter a 60 year old became sick. She report that she has continued to have N+V , and then PTA she began to have diarrhea. She reports having 4 episodes of diarrhea since coming into the ED. She reports having fevers and chills, and lower ABD Pain.   Hospital Course:   Gastroenteritis:  Alicia [name] presented with nausea, vomiting, and diarrhea and had been exposed to some bad oranges and a child sick with the same symptoms.  She most likely has viral gastroenteritis or food poisoning.  Her CT demonstrated dilated small bowel loops and an incidental mass in the left adrenal gland. She was dehydrated and given zofran and IV fluids.  Her liver function tests and lipase were normal.  Her UA demonstrated SB 1.020, protein 100, mod  LE, 11-20 WBC, many bacteria, and hyaline casts, however, she denied dysuria, urinary frequency or urgency.  She was not started on empiric antibiotics and her urine culture is pending.  Despite not starting antibiotics, she has clinically improved.  Her diarrhea and nausea have resolved and she was tolerated food and liquid prior to discharge.  Her abdominal pain is almost completely resolved.    AKI, likely secondary to dehydration, baseline creatinine near 0.6 and presented with creatinine of 1.83.  She was given IVF and her creatinine was back to baseline by discharge.  She is not on an ACEI.      Hyponatremia, hypokalemia, and hypomagnesemia were likely secondary to GI losses and dehydration and recovered with IV fluids and oral and IV repletion of potassium and IV repletion of magnesium.    Severe protein calorie malnutrition is likely temporary due to acute illness.  She has been gaining weight at home for the last several months on her current regimen, which she is encouraged to continue.  T2DM, CBG were well controlled on lantus 5 with low dose SSI.  Her A1c was 9.1.  She should continue to follow up with her primary care doctor to improve her glycemic control.    HTN, blood pressure stable on no BP medications.    CHF with unknown etiology and unknown EF.  Tolerated copious IV fluids without difficulty.    CVA with right hemiplegia and difficulty getting up the stairs at home.  PT assessment demonstrated good function  and no need for equipment or PT.    Incidental left adrenal mass 1.6 x 2.8 cm not consistent with an adenoma.  She will need follow up outpatient MRI and appropriate endocrine tests done to determine if it is active and malignant.  She should follow up with her primary care doctor for initiation of this work up.    Consultants:  none Procedures:  CT abd/pelvis Antibiotics:  none    Discharge Exam: Filed Vitals:   10/16/13 0903  BP: 149/78  Pulse: 79  Temp: 98.7  F (37.1 C)  Resp: 20   Filed Vitals:   10/15/13 2120 10/16/13 0015 10/16/13 0458 10/16/13 0903  BP: 157/83 159/95 163/76 149/78  Pulse: 79 79 74 79  Temp: 98.5 F (36.9 C) 98.8 F (37.1 C) 99.1 F (37.3 C) 98.7 F (37.1 C)  TempSrc: Oral Oral Oral Oral  Resp: 18 17 18 20   Height:      Weight: 58.469 kg (128 lb 14.4 oz)     SpO2: 95% 97% 98% 100%    General: AAF, No acute distress  HEENT: NCAT, MMM  Cardiovascular: RRR, nl S1, S2 no mrg, 2+ pulses, warm extremities  Respiratory: CTAB, no increased WOB  Abdomen: NABS, soft, mildly distended, minimal TTP RUQ without rebound or guarding  MSK: Normal tone and bulk, no LEE  Neuro: Right hemiplegia   Discharge Instructions      Discharge Orders   Future Orders Complete By Expires   Call MD for:  difficulty breathing, headache or visual disturbances  As directed    Call MD for:  extreme fatigue  As directed    Call MD for:  hives  As directed    Call MD for:  persistant dizziness or light-headedness  As directed    Call MD for:  persistant nausea and vomiting  As directed    Call MD for:  severe uncontrolled pain  As directed    Call MD for:  temperature >100.4  As directed    Diet Carb Modified  As directed    Discharge instructions  As directed    Comments:     You were hospitalized with nausea, vomiting, and diarrhea.  You may have gotten food poisoning or a virus.  You were very dehydrated when you came to the hospital and your kidneys were not working well.  With IV fluids, your kidneys have recovered.  Because ibuprofen (motrin), naprosyn (aleve), and aspirin can further injure your kidneys, please do not use these medications until your primary care doctor says that it is okay.  Please have your primary care doctor repeat your blood work in about 2 weeks to make sure that you have fully recovered from your illness.  Also, there are some tests, including a second test for infectious diarrhea which is pending.  This test  result will not come back for about a week.  Your doctor should follow up on the result.  I have given you a prescription for phenergan to use if you have any more nausea.  You do not need to get it filled unless you think you need it.  Finally, you have a tumor on your left adrenal gland.  Adrenal tumors are very common, and many times they are benign (not cancerous), but this should be investigated by your primary care doctor.  Generally, the work up includes an MRI of your adrenal gland and some blood work.  Return to the hospital if you have worsening vomiting, diarrhea, or  abdominal pain.   Increase activity slowly  As directed        Medication List    STOP taking these medications       ibuprofen 200 MG tablet  Commonly known as:  ADVIL,MOTRIN      TAKE these medications       insulin aspart 100 UNIT/ML injection  Commonly known as:  novoLOG  Inject 4.5 Units into the skin at bedtime. Prescribed 3 times daily, but takes only once because she is afraid of running out     insulin glargine 100 UNIT/ML injection  Commonly known as:  LANTUS  Inject 5 Units into the skin at bedtime.     promethazine 12.5 MG tablet  Commonly known as:  PHENERGAN  Take 1 tablet (12.5 mg total) by mouth every 6 (six) hours as needed for nausea or vomiting.       Follow-up Information   Follow up with Primary care doctor In 1 week.      The results of significant diagnostics from this hospitalization (including imaging, microbiology, ancillary and laboratory) are listed below for reference.    Significant Diagnostic Studies: Ct Abdomen Pelvis Wo Contrast  10/15/2013   CLINICAL DATA:  Nausea, vomiting, bilateral lower abdomen pain  EXAM: CT ABDOMEN AND PELVIS WITHOUT CONTRAST  TECHNIQUE: Multidetector CT imaging of the abdomen and pelvis was performed following the standard protocol without intravenous contrast.  COMPARISON:  None.  FINDINGS: There is no nephrolithiasis or hydroureteronephrosis  bilaterally. The liver, spleen, gallbladder, pancreas are normal. The right adrenal gland is normal. There is a 1.6 x 2.8 cm mass in the left adrenal gland with Hounsfield unit 29, not typical for adrenal adenoma. There is atherosclerosis of the abdominal aorta without aneurysmal dilatation. There is no abdominal lymphadenopathy. There dilated small bowel loops in the abdomen and pelvis. There is diverticulosis of colon without diverticulitis.  Fluid-filled bladder is normal. The uterus is normal. Small amount of free fluid is noted in the pelvis. No focal pneumonia or pleural effusion is identified in the visualized lung bases. Degenerative joint changes of the spine are noted.  IMPRESSION: Dilated small bowel loops in the abdomen and pelvis. Findings could be due to ileus. Followup abdominal x-ray is recommended.  Mass in the left adrenal gland not typical for adrenal adenoma. If clinically indicated, evaluation with MRI may be helpful.   Electronically Signed   By: Sherian Rein M.D.   On: 10/15/2013 00:40    Microbiology: Recent Results (from the past 240 hour(s))  URINE CULTURE     Status: None   Collection Time    10/15/13 12:49 AM      Result Value Range Status   Specimen Description URINE, CATHETERIZED   Final   Special Requests ADDED 0144   Final   Culture  Setup Time     Final   Value: 10/15/2013 02:04     Performed at [facility] Count PENDING   Incomplete   Culture     Final   Value: Culture reincubated for better growth     Performed at [facility]   Report Status PENDING   Incomplete  CLOSTRIDIUM DIFFICILE BY PCR     Status: None   Collection Time    10/15/13  7:45 AM      Result Value Range Status   C difficile by pcr NEGATIVE  NEGATIVE Final  STOOL CULTURE     Status: None   Collection Time  10/15/13  7:45 AM      Result Value Range Status   Specimen Description STOOL   Final   Special Requests Normal   Final   Culture     Final   Value:  Culture reincubated for better growth     Performed at [facility]   Report Status PENDING   Incomplete     Labs: Basic Metabolic Panel:  Recent Labs Lab 10/14/13 2140 10/15/13 0620 10/16/13 0651  NA 130* 138 134*  K 3.6 2.8* 4.4  CL 76* 88* 94*  CO2 39* 38* 33*  GLUCOSE 371* 88 128*  BUN 47* 48* 28*  CREATININE 1.83* 1.50* 0.61  CALCIUM 10.7* 9.4 9.3  MG  --  2.1  --    Liver Function Tests:  Recent Labs Lab 10/14/13 2140  AST 17  ALT 12  ALKPHOS 71  BILITOT 0.5  PROT 8.1  ALBUMIN 4.3    Recent Labs Lab 10/14/13 2140  LIPASE 13   No results found for this basename: AMMONIA,  in the last 168 hours CBC:  Recent Labs Lab 10/14/13 2140 10/15/13 0620  WBC 10.3 9.0  NEUTROABS 9.0*  --   HGB 15.4* 14.0  HCT 43.0 40.5  MCV 80.5 81.7  PLT 299 285   Cardiac Enzymes:  Recent Labs Lab 10/14/13 2330  TROPONINI <0.30   BNP: BNP (last 3 results) No results found for this basename: PROBNP,  in the last 8760 hours CBG:  Recent Labs Lab 10/15/13 1117 10/15/13 1625 10/15/13 2116 10/16/13 0748 10/16/13 1147  GLUCAP 152* 166* 122* 137* 134*    Time coordinating discharge: 45 minutes  Signed:  Retha Bither  [facility] 10/16/2013, 1:07 PM

## 2013-10-16 NOTE — Progress Notes (Signed)
Pt discharged to home after visit summary reviewed and pt capable of re verbalizing medications and follow up appointments. Pt remains stable. No signs and symptoms of distress. Educated to return to ER in the event of SOB, dizziness, chest pain, or fainting. Suliman Termini, RN   

## 2013-10-16 NOTE — Progress Notes (Signed)
INITIAL NUTRITION ASSESSMENT  DOCUMENTATION CODES Per approved criteria  -Severe malnutrition in the context of chronic illness   INTERVENTION: 1. Encourage adequate intake of meals.  2. Patient declines supplements at this time  NUTRITION DIAGNOSIS: Predicted suboptimal energy intake related to gastroenteritis as evidenced by recent history of poor intake.   Goal: Patient will meet >/=90% of estimated nutrition needs  Monitor:  PO intake, weights, labs, I/Os  Reason for Assessment: Malnutrition screening tool  57 y.o. female  Admitting Dx: Dehydration  ASSESSMENT: Patient with history of diabetes, CVA, admitted with nausea, vomiting, and diarrhea for the last 2 days secondary to gastroenteritis. She reports that this is improved. She is eating well. Diet just advanced to Carb Modified.   She reports that she had lost significant weight over the last year, from about 200 pounds to less than 100 pounds in July/August. She was hardly eating anything and unable to get food. She currently lives with a family member and is now eating well, and has gained weight. Limited physical exam completed with moderate/severe muscle wasting at the shoulder,clavicle, and calves, and fat wasting at the triceps.   Patient meets the criteria for severe MALNUTRITION in the context of chronic illness with 36%% weight loss in 1 year, PO intake <75% of estimated needs for >1 month, and moderate/severe muscle and fat wasting.   Height: Ht Readings from Last 1 Encounters:  10/15/13 5\' 7"  (1.702 m)    Weight: Wt Readings from Last 1 Encounters:  10/15/13 128 lb 14.4 oz (58.469 kg)    Ideal Body Weight: 135 pounds  % Ideal Body Weight: 95%  Wt Readings from Last 10 Encounters:  10/15/13 128 lb 14.4 oz (58.469 kg)    Usual Body Weight: 200 pounds  % Usual Body Weight: 64%  BMI:  Body mass index is 20.18 kg/(m^2). Patient is normal weight.   Estimated Nutritional Needs: Kcal: 1600-1700  kcal Protein: 75-90g Fluid: >2 L/day  Skin: Intact  Diet Order: Carb Control  EDUCATION NEEDS: -No education needs identified at this time   Intake/Output Summary (Last 24 hours) at 10/16/13 1056 Last data filed at 10/16/13 0900  Gross per 24 hour  Intake   1920 ml  Output    503 ml  Net   1417 ml    Last BM: 11/23   Labs:   Recent Labs Lab 10/14/13 2140 10/15/13 0620 10/16/13 0651  NA 130* 138 134*  K 3.6 2.8* 4.4  CL 76* 88* 94*  CO2 39* 38* 33*  BUN 47* 48* 28*  CREATININE 1.83* 1.50* 0.61  CALCIUM 10.7* 9.4 9.3  MG  --  2.1  --   GLUCOSE 371* 88 128*    CBG (last 3)   Recent Labs  10/15/13 1625 10/15/13 2116 10/16/13 0748  GLUCAP 166* 122* 137*    Scheduled Meds: . enoxaparin (LOVENOX) injection  40 mg Subcutaneous Q24H  . influenza vac split quadrivalent PF  0.5 mL Intramuscular Tomorrow-1000  . insulin aspart  0-9 Units Subcutaneous TID WC  . insulin glargine  5 Units Subcutaneous QHS    Continuous Infusions: . sodium chloride 100 mL/hr at 10/15/13 2327    Past Medical History  Diagnosis Date  . Diabetes mellitus, type II   . Hypertension   . H/O: CVA (cerebrovascular accident)   . CHF (congestive heart failure)     Past Surgical History  Procedure Laterality Date  . Tubal ligation    . Cardiac catheterization  Larey Seat, RD, LDN Pager #: [phone] After-Hours Pager #: [phone]

## 2013-10-16 NOTE — Evaluation (Signed)
Physical Therapy Evaluation Patient Details Name: Alicia [name] MRN: [id number] DOB: 06/05/1956 Today's Date: 10/16/2013 Time: 7829-5621 PT Time Calculation (min): 15 min  PT Assessment / Plan / Recommendation History of Present Illness   Pt adm secondary to n/v and lower abdominal pain. Pt being treated for dehydration and acute gastroenteritis. Pt with previous CVA and residual Rt sided weakness.     Clinical Impression  Pt adm secondary to above. Pt seems to be at baseline. Ambulating and transferring at mod I at this time. Encouraged pt to amb 2-3x daily with nursing. Educated pt to hold pillow to support abdomen when coughing to help with pain relief. Will sign off at this time. No acute PT needs warranted.     PT Assessment  Patent does not need any further PT services    Follow Up Recommendations  No PT follow up    Does the patient have the potential to tolerate intense rehabilitation      Barriers to Discharge        Equipment Recommendations  None recommended by PT    Recommendations for Other Services     Frequency      Precautions / Restrictions Precautions Precautions: None Restrictions Weight Bearing Restrictions: No   Pertinent Vitals/Pain 4/10 "soreness" in lower abdomen.       Mobility  Bed Mobility Bed Mobility: Not assessed Details for Bed Mobility Assistance: pt sitting in chair and returned to chair  Transfers Transfers: Sit to Stand;Stand to Sit Sit to Stand: 6: Modified independent (Device/Increase time);From chair/3-in-1;With armrests Stand to Sit: 6: Modified independent (Device/Increase time);To chair/3-in-1;With armrests Details for Transfer Assistance: pt demo good technique; incr time secondary to generalized weakness; pt appears to be at baseline  Ambulation/Gait Ambulation/Gait Assistance: 6: Modified independent (Device/Increase time) Ambulation Distance (Feet): 150 Feet Assistive device: Other (Comment) (IV pole supporting Rt UE  ) Ambulation/Gait Assistance Details: pt with guarded gt secondary to pain in abdomen Gait Pattern: Within Functional Limits Gait velocity: WFL  Stairs: No Wheelchair Mobility Wheelchair Mobility: No         PT Diagnosis:    PT Problem List:   PT Treatment Interventions:       PT Goals(Current goals can be found in the care plan section) Acute Rehab PT Goals Patient Stated Goal: to go home today and start on my [place] PT Goal Formulation: No goals set, d/c therapy Potential to Achieve Goals: Good  Visit Information  Last PT Received On: 10/16/13 Assistance Needed: +1       Prior Functioning  Home Living Family/patient expects to be discharged to:: Private residence Living Arrangements: Children Available Help at Discharge: Family;Available PRN/intermittently Type of Home: House Home Access: Level entry Home Layout: Two level;Bed/bath upstairs Alternate Level Stairs-Number of Steps: 14 Home Equipment: Cane - single point Additional Comments: pt reports she bumps up steps when she needs to. reported she can lay downstairs on couch  Prior Function Level of Independence: Independent with assistive device(s) Comments: pt lives with daughter; helps out with cooking, cleaning the house  Communication Communication: No difficulties Dominant Hand: Right    Cognition  Cognition Arousal/Alertness: Awake/alert Behavior During Therapy: WFL for tasks assessed/performed Overall Cognitive Status: Within Functional Limits for tasks assessed    Extremity/Trunk Assessment Upper Extremity Assessment Upper Extremity Assessment: Defer to OT evaluation Lower Extremity Assessment Lower Extremity Assessment: RLE deficits/detail RLE Deficits / Details: grossly 3-/5 secondary to previous CVA Cervical / Trunk Assessment Cervical / Trunk Assessment: Normal   Balance  Balance Balance Assessed: Yes Static Standing Balance Static Standing - Balance Support: Right upper extremity  supported;During functional activity Static Standing - Level of Assistance: 6: Modified independent (Device/Increase time) High Level Balance High Level Balance Activites: Direction changes High Level Balance Comments: no LOB noted  End of Session PT - End of Session Equipment Utilized During Treatment: Gait belt Activity Tolerance: Patient tolerated treatment well Patient left: in chair;with call bell/phone within reach;with nursing/sitter in room Nurse Communication: Mobility status  GP     Donell Sievert, [place] [phone] 10/16/2013, 8:34 AM

## 2013-10-17 LAB — URINE CULTURE: Colony Count: >=100000

## 2013-10-19 LAB — STOOL CULTURE: Special Requests: NORMAL

## 2013-11-01 ENCOUNTER — Ambulatory Visit: Payer: Medicaid Other | Attending: Internal Medicine | Admitting: Internal Medicine

## 2013-11-01 ENCOUNTER — Encounter: Payer: Self-pay | Admitting: Internal Medicine

## 2013-11-01 VITALS — BP 154/97 | HR 85 | Temp 98.7°F | Resp 14 | Ht 66.0 in | Wt 130.4 lb

## 2013-11-01 DIAGNOSIS — E278 Other specified disorders of adrenal gland: Secondary | ICD-10-CM

## 2013-11-01 DIAGNOSIS — Z7189 Other specified counseling: Secondary | ICD-10-CM

## 2013-11-01 DIAGNOSIS — Z7689 Persons encountering health services in other specified circumstances: Secondary | ICD-10-CM

## 2013-11-01 DIAGNOSIS — E279 Disorder of adrenal gland, unspecified: Secondary | ICD-10-CM

## 2013-11-01 DIAGNOSIS — R197 Diarrhea, unspecified: Secondary | ICD-10-CM

## 2013-11-01 DIAGNOSIS — E119 Type 2 diabetes mellitus without complications: Secondary | ICD-10-CM | POA: Insufficient documentation

## 2013-11-01 LAB — CBC WITH DIFFERENTIAL/PLATELET
Eosinophils Relative: 1 % (ref 0–5)
HCT: 39.7 % (ref 36.0–46.0)
Lymphocytes Relative: 28 % (ref 12–46)
Lymphs Abs: 1.5 10*3/uL (ref 0.7–4.0)
MCV: 81.4 fL (ref 78.0–100.0)
Monocytes Absolute: 0.5 10*3/uL (ref 0.1–1.0)
Monocytes Relative: 9 % (ref 3–12)
Neutro Abs: 3.4 10*3/uL (ref 1.7–7.7)
RBC: 4.88 MIL/uL (ref 3.87–5.11)
WBC: 5.5 10*3/uL (ref 4.0–10.5)

## 2013-11-01 MED ORDER — ACIDOPHILUS PROBIOTIC 100 MG PO CAPS: 100.0000 mg | ORAL_CAPSULE | Freq: Two times a day (BID) | ORAL | Status: DC

## 2013-11-01 MED ORDER — FREESTYLE SYSTEM KIT: 1.0000 | PACK | Status: DC | PRN

## 2013-11-01 MED ORDER — INSULIN NPH ISOPHANE & REGULAR (70-30) 100 UNIT/ML ~~LOC~~ SUSP: 10.0000 [IU] | Freq: Two times a day (BID) | SUBCUTANEOUS | Status: DC

## 2013-11-01 MED ORDER — GLUCOSE BLOOD VI STRP: ORAL_STRIP | Status: DC

## 2013-11-01 MED ORDER — LISINOPRIL 20 MG PO TABS: 10.0000 mg | ORAL_TABLET | Freq: Every day | ORAL | Status: DC

## 2013-11-01 NOTE — Progress Notes (Signed)
Pt is here for a hospital f/u. Requests results for culture form hospital visit. Possible tumor on kidney. May need need blood work?

## 2013-11-01 NOTE — Progress Notes (Signed)
Patient ID: Alicia [name], female   DOB: 12/07/1955, 57 y.o.   MRN: [id number]   CC:  HPI:  57 year old female with a history of type 2 diabetes, most recent A1c of 9.1 who was admitted to the hospital on 11/22 for nausea vomiting abdominal pain. The patient was discharged after conservative management, stool C. difficile and culture were negative. CT scan showed dilated small loops of bowel and an incidental mass in the left adrenal gland. Patient states that her diarrhea had resolved upon discharge and yesterday she had milk and developed diarrhea and bloating again. She states that she had 6-8 bowel movements since yesterday. She also states that she has not been able to afford her insulin for a couple of weeks. She will need workup for left adrenal mass 1.6 x 2.8 cm  No Known Allergies Past Medical History  Diagnosis Date  . Diabetes mellitus, type II   . Hypertension   . H/O: CVA (cerebrovascular accident)   . CHF (congestive heart failure)    Current Outpatient Prescriptions on File Prior to Visit  Medication Sig Dispense Refill  . promethazine (PHENERGAN) 12.5 MG tablet Take 1 tablet (12.5 mg total) by mouth every 6 (six) hours as needed for nausea or vomiting.  20 tablet  0   No current facility-administered medications on file prior to visit.   Family History  Problem Relation Age of Onset  . Diabetes Mellitus II Mother   . Hypertension Mother   . CVA Mother    History   Social History  . Marital Status: Unknown    Spouse Name: N/A    Number of Children: N/A  . Years of Education: N/A   Occupational History  . Not on file.   Social History Main Topics  . Smoking status: Current Every Day Smoker -- 0.50 packs/day for 25 years    Types: Cigarettes  . Smokeless tobacco: Never Used  . Alcohol Use: No  . Drug Use: No  . Sexual Activity: Yes   Other Topics Concern  . Not on file   Social History Narrative  . No narrative on file    Review of Systems   Constitutional: Negative for fever, chills, diaphoresis, activity change, appetite change and fatigue.  HENT: Negative for ear pain, nosebleeds, congestion, facial swelling, rhinorrhea, neck pain, neck stiffness and ear discharge.   Eyes: Negative for pain, discharge, redness, itching and visual disturbance.  Respiratory: Negative for cough, choking, chest tightness, shortness of breath, wheezing and stridor.   Cardiovascular: Negative for chest pain, palpitations and leg swelling.  Gastrointestinal: Negative for abdominal distention.  Genitourinary: Negative for dysuria, urgency, frequency, hematuria, flank pain, decreased urine volume, difficulty urinating and dyspareunia.  Musculoskeletal: Negative for back pain, joint swelling, arthralgias and gait problem.  Neurological: Negative for dizziness, tremors, seizures, syncope, facial asymmetry, speech difficulty, weakness, light-headedness, numbness and headaches.  Hematological: Negative for adenopathy. Does not bruise/bleed easily.  Psychiatric/Behavioral: Negative for hallucinations, behavioral problems, confusion, dysphoric mood, decreased concentration and agitation.    Objective:   Filed Vitals:   11/01/13 1153  BP: 154/97  Pulse: 85  Temp: 98.7 F (37.1 C)  Resp: 14    Physical Exam  Constitutional: Appears well-developed and well-nourished. No distress.  HENT: Normocephalic. External right and left ear normal. Oropharynx is clear and moist.  Eyes: Conjunctivae and EOM are normal. PERRLA, no scleral icterus.  Neck: Normal ROM. Neck supple. No JVD. No tracheal deviation. No thyromegaly.  CVS: RRR, S1/S2 +, no murmurs,  no gallops, no carotid bruit.  Pulmonary: Effort and breath sounds normal, no stridor, rhonchi, wheezes, rales.  Abdominal: Soft. BS +,  no distension, tenderness, rebound or guarding.  Musculoskeletal: Normal range of motion. No edema and no tenderness.  Lymphadenopathy: No lymphadenopathy noted, cervical,  inguinal. Neuro: Alert. Normal reflexes, muscle tone coordination. No cranial nerve deficit. Skin: Skin is warm and dry. No rash noted. Not diaphoretic. No erythema. No pallor.  Psychiatric: Normal mood and affect. Behavior, judgment, thought content normal.   Lab Results  Component Value Date   WBC 9.0 10/15/2013   HGB 14.0 10/15/2013   HCT 40.5 10/15/2013   MCV 81.7 10/15/2013   PLT 285 10/15/2013   Lab Results  Component Value Date   CREATININE 0.61 10/16/2013   BUN 28* 10/16/2013   NA 134* 10/16/2013   K 4.4 10/16/2013   CL 94* 10/16/2013   CO2 33* 10/16/2013    Lab Results  Component Value Date   HGBA1C 9.1* 10/15/2013   Lipid Panel  No results found for this basename: chol, trig, hdl, cholhdl, vldl, ldlcalc       Assessment and plan:   Patient Active Problem List   Diagnosis Date Noted  . Protein-calorie malnutrition, severe 10/16/2013  . Mass of left adrenal gland 10/16/2013  . Dehydration 10/15/2013  . Gastroenteritis, acute 10/15/2013  . Hyperglycemia 10/15/2013  . Hyponatremia 10/15/2013  . Diabetes mellitus, type 2 10/15/2013  . Hypertension 10/15/2013  . Hypokalemia 10/15/2013       Diabetes We'll discontinue NovoLog and Lantus and switch the patient to Novolin 70 x 30, 10 units twice a day   Diarrhea Most likely secondary to lactose intolerance C. difficile was negative Patient has been prescribed a probiotic  Incidental adrenal mass We'll order an MRI Check aldosterone, cortisol, plasma metanephrines  Followup in 2 weeks  The patient was given clear instructions to go to ER or return to medical center if symptoms don't improve, worsen or new problems develop. The patient verbalized understanding. The patient was told to call to get any lab results if not heard anything in the next week.

## 2013-11-02 LAB — COMPLETE METABOLIC PANEL WITH GFR
AST: 12 U/L (ref 0–37)
Alkaline Phosphatase: 61 U/L (ref 39–117)
BUN: 15 mg/dL (ref 6–23)
CO2: 27 mEq/L (ref 19–32)
Chloride: 94 mEq/L — ABNORMAL LOW (ref 96–112)
Creat: 0.46 mg/dL — ABNORMAL LOW (ref 0.50–1.10)
GFR, Est Non African American: >89 mL/min
Glucose, Bld: 210 mg/dL — ABNORMAL HIGH (ref 70–99)
Total Bilirubin: 0.4 mg/dL (ref 0.3–1.2)

## 2013-11-03 ENCOUNTER — Telehealth: Payer: Self-pay | Admitting: Emergency Medicine

## 2013-11-03 NOTE — Telephone Encounter (Signed)
Left message with normal lab results.

## 2013-11-03 NOTE — Telephone Encounter (Signed)
Message copied by Darlis Loan on Fri Nov 03, 2013 11:03 AM ------      Message from: Susie Cassette MD, Germain Osgood      Created: Fri Nov 03, 2013  9:58 AM       Please notify patient of the patient's labs are normal ------

## 2013-11-04 LAB — METANEPHRINES, PLASMA
Metanephrine, Free: 42 pg/mL (ref <=57)
Normetanephrine, Free: 70 pg/mL (ref <=148)
Total Metanephrines-Plasma: 112 pg/mL (ref <=205)

## 2013-11-08 ENCOUNTER — Telehealth: Payer: Self-pay | Admitting: *Deleted

## 2013-11-08 NOTE — Telephone Encounter (Signed)
Contacted pt to infrom her that her lab results were all normal. Pt acknowledged result and call was completed effectively.

## 2013-11-10 ENCOUNTER — Ambulatory Visit (HOSPITAL_COMMUNITY)
Admission: RE | Admit: 2013-11-10 | Discharge: 2013-11-10 | Disposition: A | Discharge status: Home / Self Care | Payer: Medicaid Other | Source: Ambulatory Visit | Attending: Internal Medicine | Admitting: Internal Medicine

## 2013-11-10 DIAGNOSIS — R634 Abnormal weight loss: Secondary | ICD-10-CM | POA: Insufficient documentation

## 2013-11-10 DIAGNOSIS — K8020 Calculus of gallbladder without cholecystitis without obstruction: Secondary | ICD-10-CM | POA: Insufficient documentation

## 2013-11-10 DIAGNOSIS — E278 Other specified disorders of adrenal gland: Secondary | ICD-10-CM

## 2013-11-10 DIAGNOSIS — D3500 Benign neoplasm of unspecified adrenal gland: Secondary | ICD-10-CM | POA: Insufficient documentation

## 2013-11-10 DIAGNOSIS — R197 Diarrhea, unspecified: Secondary | ICD-10-CM

## 2013-11-10 DIAGNOSIS — Z7689 Persons encountering health services in other specified circumstances: Secondary | ICD-10-CM

## 2013-11-10 MED ORDER — GADOBENATE DIMEGLUMINE 529 MG/ML IV SOLN
15.0000 mL | Freq: Once | INTRAVENOUS | Status: AC | PRN
  Administered 2013-11-10: 13 mL via INTRAVENOUS

## 2013-11-20 ENCOUNTER — Ambulatory Visit: Payer: Medicaid Other | Attending: Internal Medicine | Admitting: Internal Medicine

## 2013-11-20 ENCOUNTER — Encounter: Payer: Self-pay | Admitting: Internal Medicine

## 2013-11-20 VITALS — BP 128/87 | HR 88 | Temp 98.7°F | Resp 14 | Ht 67.0 in | Wt 128.4 lb

## 2013-11-20 DIAGNOSIS — D3500 Benign neoplasm of unspecified adrenal gland: Secondary | ICD-10-CM | POA: Insufficient documentation

## 2013-11-20 DIAGNOSIS — E119 Type 2 diabetes mellitus without complications: Secondary | ICD-10-CM

## 2013-11-20 NOTE — Progress Notes (Signed)
Patient ID: Alicia [name], female   DOB: 02-03-1956, 57 y.o.   MRN: [id number]   CC:  HPI: Patient seen for evaluation of an adrenal adenoma, hormonal studies are within normal limits. MRI shows benign left adrenal adenoma. Patient still complains of some abdominal bloating but no more  diarrhea .  No Known Allergies Past Medical History  Diagnosis Date  . Diabetes mellitus, type II   . Hypertension   . H/O: CVA (cerebrovascular accident)   . CHF (congestive heart failure)    Current Outpatient Prescriptions on File Prior to Visit  Medication Sig Dispense Refill  . glucose blood test strip Use as instructed  100 each  12  . glucose monitoring kit (FREESTYLE) monitoring kit 1 each by Does not apply route as needed for other.  1 each  2  . Lactobacillus (ACIDOPHILUS PROBIOTIC) 100 MG CAPS Take 1 capsule (100 mg total) by mouth 2 (two) times daily.  60 capsule  0  . lisinopril (PRINIVIL,ZESTRIL) 20 MG tablet Take 0.5 tablets (10 mg total) by mouth daily.  30 tablet  2  . promethazine (PHENERGAN) 12.5 MG tablet Take 1 tablet (12.5 mg total) by mouth every 6 (six) hours as needed for nausea or vomiting.  20 tablet  0  . insulin NPH-regular (NOVOLIN 70/30) (70-30) 100 UNIT/ML injection Inject 10 Units into the skin 2 (two) times daily with a meal.  10 mL  12   No current facility-administered medications on file prior to visit.   Family History  Problem Relation Age of Onset  . Diabetes Mellitus II Mother   . Hypertension Mother   . CVA Mother    History   Social History  . Marital Status: Unknown    Spouse Name: N/A    Number of Children: N/A  . Years of Education: N/A   Occupational History  . Not on file.   Social History Main Topics  . Smoking status: Current Every Day Smoker -- 0.50 packs/day for 25 years    Types: Cigarettes  . Smokeless tobacco: Never Used  . Alcohol Use: No  . Drug Use: No  . Sexual Activity: Yes   Other Topics Concern  . Not on file    Social History Narrative  . No narrative on file    Review of Systems  Constitutional: Negative for fever, chills, diaphoresis, activity change, appetite change and fatigue.  HENT: Negative for ear pain, nosebleeds, congestion, facial swelling, rhinorrhea, neck pain, neck stiffness and ear discharge.   Eyes: Negative for pain, discharge, redness, itching and visual disturbance.  Respiratory: Negative for cough, choking, chest tightness, shortness of breath, wheezing and stridor.   Cardiovascular: Negative for chest pain, palpitations and leg swelling.  Gastrointestinal: Negative for abdominal distention.  Genitourinary: Negative for dysuria, urgency, frequency, hematuria, flank pain, decreased urine volume, difficulty urinating and dyspareunia.  Musculoskeletal: Negative for back pain, joint swelling, arthralgias and gait problem.  Neurological: Negative for dizziness, tremors, seizures, syncope, facial asymmetry, speech difficulty, weakness, light-headedness, numbness and headaches.  Hematological: Negative for adenopathy. Does not bruise/bleed easily.  Psychiatric/Behavioral: Negative for hallucinations, behavioral problems, confusion, dysphoric mood, decreased concentration and agitation.    Objective:   Filed Vitals:   11/20/13 0920  BP: 128/87  Pulse: 88  Temp: 98.7 F (37.1 C)  Resp: 14    Physical Exam  Constitutional: Appears well-developed and well-nourished. No distress.  HENT: Normocephalic. External right and left ear normal. Oropharynx is clear and moist.  Eyes: Conjunctivae and EOM  are normal. PERRLA, no scleral icterus.  Neck: Normal ROM. Neck supple. No JVD. No tracheal deviation. No thyromegaly.  CVS: RRR, S1/S2 +, no murmurs, no gallops, no carotid bruit.  Pulmonary: Effort and breath sounds normal, no stridor, rhonchi, wheezes, rales.  Abdominal: Soft. BS +,  no distension, tenderness, rebound or guarding.  Musculoskeletal: Normal range of motion. No edema  and no tenderness.  Lymphadenopathy: No lymphadenopathy noted, cervical, inguinal. Neuro: Alert. Normal reflexes, muscle tone coordination. No cranial nerve deficit. Skin: Skin is warm and dry. No rash noted. Not diaphoretic. No erythema. No pallor.  Psychiatric: Normal mood and affect. Behavior, judgment, thought content normal.   Lab Results  Component Value Date   WBC 5.5 11/01/2013   HGB 13.2 11/01/2013   HCT 39.7 11/01/2013   MCV 81.4 11/01/2013   PLT 276 11/01/2013   Lab Results  Component Value Date   CREATININE 0.46* 11/01/2013   BUN 15 11/01/2013   NA 132* 11/01/2013   K 3.9 11/01/2013   CL 94* 11/01/2013   CO2 27 11/01/2013    Lab Results  Component Value Date   HGBA1C 9.1* 10/15/2013   Lipid Panel  No results found for this basename: chol, trig, hdl, cholhdl, vldl, ldlcalc       Assessment and plan:   Patient Active Problem List   Diagnosis Date Noted  . Protein-calorie malnutrition, severe 10/16/2013  . Mass of left adrenal gland 10/16/2013  . Dehydration 10/15/2013  . Gastroenteritis, acute 10/15/2013  . Hyperglycemia 10/15/2013  . Hyponatremia 10/15/2013  . Diabetes mellitus, type 2 10/15/2013  . Hypertension 10/15/2013  . Hypokalemia 10/15/2013       Left adrenal adenoma Reassured that the patient does not need any further workup  Abdominal bloating? Gastroparesis Advised him small frequent meals Better control of her diabetes required No further workup indicated  Follow up in 3 months   The patient was given clear instructions to go to ER or return to medical center if symptoms don't improve, worsen or new problems develop. The patient verbalized understanding. The patient was told to call to get any lab results if not heard anything in the next week.

## 2013-11-20 NOTE — Progress Notes (Signed)
Pt is here for 2 week f/u. Requests results from MRI.

## 2013-12-24 HISTORY — PX: VENTRAL HERNIA REPAIR: SHX424

## 2014-01-12 ENCOUNTER — Emergency Department (HOSPITAL_COMMUNITY): Payer: Medicaid Other

## 2014-01-12 ENCOUNTER — Encounter (HOSPITAL_COMMUNITY): Payer: Self-pay | Admitting: Emergency Medicine

## 2014-01-12 ENCOUNTER — Inpatient Hospital Stay (HOSPITAL_COMMUNITY)
Admission: EM | Admit: 2014-01-12 | Discharge: 2014-01-20 | DRG: 330 | Disposition: A | Discharge status: Home / Self Care | Payer: Medicaid Other | Attending: General Surgery | Admitting: General Surgery

## 2014-01-12 DIAGNOSIS — I10 Essential (primary) hypertension: Secondary | ICD-10-CM

## 2014-01-12 DIAGNOSIS — F17200 Nicotine dependence, unspecified, uncomplicated: Secondary | ICD-10-CM | POA: Diagnosis present

## 2014-01-12 DIAGNOSIS — Z794 Long term (current) use of insulin: Secondary | ICD-10-CM

## 2014-01-12 DIAGNOSIS — Z8249 Family history of ischemic heart disease and other diseases of the circulatory system: Secondary | ICD-10-CM

## 2014-01-12 DIAGNOSIS — IMO0001 Reserved for inherently not codable concepts without codable children: Secondary | ICD-10-CM | POA: Diagnosis present

## 2014-01-12 DIAGNOSIS — I509 Heart failure, unspecified: Secondary | ICD-10-CM | POA: Diagnosis present

## 2014-01-12 DIAGNOSIS — K429 Umbilical hernia without obstruction or gangrene: Secondary | ICD-10-CM | POA: Diagnosis present

## 2014-01-12 DIAGNOSIS — E860 Dehydration: Secondary | ICD-10-CM | POA: Diagnosis present

## 2014-01-12 DIAGNOSIS — Z823 Family history of stroke: Secondary | ICD-10-CM

## 2014-01-12 DIAGNOSIS — K565 Intestinal adhesions [bands], unspecified as to partial versus complete obstruction: Principal | ICD-10-CM | POA: Diagnosis present

## 2014-01-12 DIAGNOSIS — E119 Type 2 diabetes mellitus without complications: Secondary | ICD-10-CM

## 2014-01-12 DIAGNOSIS — E876 Hypokalemia: Secondary | ICD-10-CM

## 2014-01-12 DIAGNOSIS — Z22322 Carrier or suspected carrier of Methicillin resistant Staphylococcus aureus: Secondary | ICD-10-CM

## 2014-01-12 DIAGNOSIS — R112 Nausea with vomiting, unspecified: Secondary | ICD-10-CM

## 2014-01-12 DIAGNOSIS — K56609 Unspecified intestinal obstruction, unspecified as to partial versus complete obstruction: Secondary | ICD-10-CM

## 2014-01-12 DIAGNOSIS — R188 Other ascites: Secondary | ICD-10-CM | POA: Diagnosis present

## 2014-01-12 DIAGNOSIS — Z8673 Personal history of transient ischemic attack (TIA), and cerebral infarction without residual deficits: Secondary | ICD-10-CM

## 2014-01-12 DIAGNOSIS — R109 Unspecified abdominal pain: Secondary | ICD-10-CM

## 2014-01-12 DIAGNOSIS — E1165 Type 2 diabetes mellitus with hyperglycemia: Secondary | ICD-10-CM

## 2014-01-12 DIAGNOSIS — Z833 Family history of diabetes mellitus: Secondary | ICD-10-CM

## 2014-01-12 DIAGNOSIS — N390 Urinary tract infection, site not specified: Secondary | ICD-10-CM | POA: Diagnosis not present

## 2014-01-12 LAB — CBC WITH DIFFERENTIAL/PLATELET
Basophils Absolute: 0 10*3/uL (ref 0.0–0.1)
Basophils Relative: 0 % (ref 0–1)
Eosinophils Absolute: 0 10*3/uL (ref 0.0–0.7)
Eosinophils Relative: 0 % (ref 0–5)
HCT: 41.7 % (ref 36.0–46.0)
HEMOGLOBIN: 14.6 g/dL (ref 12.0–15.0)
LYMPHS ABS: 0.9 10*3/uL (ref 0.7–4.0)
Lymphocytes Relative: 10 % — ABNORMAL LOW (ref 12–46)
MCH: 28 pg (ref 26.0–34.0)
MCHC: 35 g/dL (ref 30.0–36.0)
MCV: 80 fL (ref 78.0–100.0)
Monocytes Absolute: 0.7 10*3/uL (ref 0.1–1.0)
Monocytes Relative: 7 % (ref 3–12)
NEUTROS PCT: 83 % — AB (ref 43–77)
Neutro Abs: 7.8 10*3/uL — ABNORMAL HIGH (ref 1.7–7.7)
Platelets: 382 10*3/uL (ref 150–400)
RBC: 5.21 MIL/uL — AB (ref 3.87–5.11)
RDW: 13.2 % (ref 11.5–15.5)
WBC: 9.4 10*3/uL (ref 4.0–10.5)

## 2014-01-12 LAB — URINALYSIS, ROUTINE W REFLEX MICROSCOPIC
Bilirubin Urine: NEGATIVE
Glucose, UA: NEGATIVE mg/dL
Hgb urine dipstick: NEGATIVE
KETONES UR: 15 mg/dL — AB
NITRITE: NEGATIVE
Protein, ur: 100 mg/dL — AB
Specific Gravity, Urine: 1.02 (ref 1.005–1.030)
Urobilinogen, UA: 0.2 mg/dL (ref 0.0–1.0)
pH: 6 (ref 5.0–8.0)

## 2014-01-12 LAB — URINE MICROSCOPIC-ADD ON

## 2014-01-12 LAB — COMPREHENSIVE METABOLIC PANEL
ALK PHOS: 75 U/L (ref 39–117)
ALT: 15 U/L (ref 0–35)
AST: 19 U/L (ref 0–37)
Albumin: 3.7 g/dL (ref 3.5–5.2)
BUN: 34 mg/dL — ABNORMAL HIGH (ref 6–23)
CO2: 37 mEq/L — ABNORMAL HIGH (ref 19–32)
Calcium: 11.2 mg/dL — ABNORMAL HIGH (ref 8.4–10.5)
Chloride: 81 mEq/L — ABNORMAL LOW (ref 96–112)
Creatinine, Ser: 1.31 mg/dL — ABNORMAL HIGH (ref 0.50–1.10)
GFR calc non Af Amer: 45 mL/min — ABNORMAL LOW (ref >90)
GFR, EST AFRICAN AMERICAN: 52 mL/min — AB (ref >90)
Glucose, Bld: 250 mg/dL — ABNORMAL HIGH (ref 70–99)
POTASSIUM: 4.2 meq/L (ref 3.7–5.3)
Sodium: 135 mEq/L — ABNORMAL LOW (ref 137–147)
Total Bilirubin: 0.5 mg/dL (ref 0.3–1.2)
Total Protein: 8 g/dL (ref 6.0–8.3)

## 2014-01-12 LAB — I-STAT TROPONIN, ED: Troponin i, poc: 0.04 ng/mL (ref 0.00–0.08)

## 2014-01-12 LAB — LIPASE, BLOOD: LIPASE: 10 U/L — AB (ref 11–59)

## 2014-01-12 LAB — CBG MONITORING, ED: Glucose-Capillary: 227 mg/dL — ABNORMAL HIGH (ref 70–99)

## 2014-01-12 MED ORDER — IOHEXOL 300 MG/ML  SOLN
100.0000 mL | Freq: Once | INTRAMUSCULAR | Status: AC | PRN
  Administered 2014-01-12: 100 mL via INTRAVENOUS

## 2014-01-12 MED ORDER — SODIUM CHLORIDE 0.9 % IV BOLUS (SEPSIS)
1000.0000 mL | Freq: Once | INTRAVENOUS | Status: AC
  Administered 2014-01-12: 1000 mL via INTRAVENOUS

## 2014-01-12 MED ORDER — MORPHINE SULFATE 4 MG/ML IJ SOLN
4.0000 mg | Freq: Once | INTRAMUSCULAR | Status: AC
  Administered 2014-01-12: 4 mg via INTRAVENOUS
  Filled 2014-01-12: qty 1

## 2014-01-12 MED ORDER — ONDANSETRON HCL 4 MG/2ML IJ SOLN
4.0000 mg | Freq: Once | INTRAMUSCULAR | Status: AC
  Administered 2014-01-12: 4 mg via INTRAVENOUS
  Filled 2014-01-12: qty 2

## 2014-01-12 MED ORDER — DEXTROSE 5 % IV SOLN
1.0000 g | Freq: Once | INTRAVENOUS | Status: AC
  Administered 2014-01-12: 1 g via INTRAVENOUS
  Filled 2014-01-12: qty 10

## 2014-01-12 NOTE — ED Notes (Signed)
CBG checked 227

## 2014-01-12 NOTE — ED Notes (Signed)
The pt has not had a bm since last week.  She is c/o abd pain since yesterday with nausea and vomiting.  No diarrhea or bms

## 2014-01-12 NOTE — ED Provider Notes (Signed)
CSN: [id number]     Arrival date & time 01/12/14  1948 History   First MD Initiated Contact with Patient 01/12/14 2036     Chief Complaint  Patient presents with  . Abdominal Pain     (Consider location/radiation/quality/duration/timing/severity/associated sxs/prior Treatment) Patient is a 58 y.o. female presenting with abdominal pain. The history is provided by the patient.  Abdominal Pain Pain location:  Epigastric, RLQ, suprapubic and RUQ Pain quality: sharp   Pain radiates to:  Does not radiate Pain severity:  Moderate Onset quality:  Gradual Duration:  2 days Timing:  Constant Progression:  Worsening Chronicity:  Recurrent Context: not eating, not recent illness and not trauma   Relieved by:  Nothing Worsened by:  Nothing tried Associated symptoms: vomiting   Associated symptoms: no chills, no cough, no diarrhea, no dysuria, no fever and no shortness of breath     Past Medical History  Diagnosis Date  . Diabetes mellitus, type II   . Hypertension   . H/O: CVA (cerebrovascular accident)   . CHF (congestive heart failure)    Past Surgical History  Procedure Laterality Date  . Tubal ligation    . Cardiac catheterization     Family History  Problem Relation Age of Onset  . Diabetes Mellitus II Mother   . Hypertension Mother   . CVA Mother    History  Substance Use Topics  . Smoking status: Current Every Day Smoker -- 0.50 packs/day for 25 years    Types: Cigarettes  . Smokeless tobacco: Never Used  . Alcohol Use: No   OB History   Grav Para Term Preterm Abortions TAB SAB Ect Mult Living                 Review of Systems  Constitutional: Negative for fever and chills.  Respiratory: Negative for cough and shortness of breath.   Gastrointestinal: Positive for vomiting and abdominal pain. Negative for diarrhea.  Genitourinary: Negative for dysuria.  All other systems reviewed and are negative.      Allergies  Asa and Other  Home Medications    Current Outpatient Rx  Name  Route  Sig  Dispense  Refill  . bisacodyl (DULCOLAX) 5 MG EC tablet   Oral   Take 10 mg by mouth daily as needed for moderate constipation.         . insulin NPH-regular Human (NOVOLIN 70/30) (70-30) 100 UNIT/ML injection   Subcutaneous   Inject 5 Units into the skin daily with breakfast.         . lisinopril (PRINIVIL,ZESTRIL) 20 MG tablet   Oral   Take 0.5 tablets (10 mg total) by mouth daily.   30 tablet   2    BP 104/69  Pulse 66  Temp(Src) 97.5 F (36.4 C) (Oral)  Resp 18  Wt 129 lb (58.514 kg)  SpO2 100% Physical Exam  Nursing note and vitals reviewed. Constitutional: She is oriented to person, place, and time. She appears well-developed and well-nourished. No distress.  HENT:  Head: Normocephalic and atraumatic.  Eyes: EOM are normal. Pupils are equal, round, and reactive to light.  Neck: Normal range of motion. Neck supple.  Cardiovascular: Normal rate and regular rhythm.  Exam reveals no friction rub.   No murmur heard. Pulmonary/Chest: Effort normal and breath sounds normal. No respiratory distress. She has no wheezes. She has no rales.  Abdominal: Soft. She exhibits no distension. There is tenderness (epigastric, RUQ, RLQ, suprapubic). There is no rebound.  Musculoskeletal:  Normal range of motion. She exhibits no edema.  Neurological: She is alert and oriented to person, place, and time.  Skin: No rash noted. She is not diaphoretic.    ED Course  Procedures (including critical care time) Labs Review Labs Reviewed  CBC WITH DIFFERENTIAL - Abnormal; Notable for the following:    RBC 5.21 (*)    Neutrophils Relative % 83 (*)    Neutro Abs 7.8 (*)    Lymphocytes Relative 10 (*)    All other components within normal limits  COMPREHENSIVE METABOLIC PANEL - Abnormal; Notable for the following:    Sodium 135 (*)    Chloride 81 (*)    CO2 37 (*)    Glucose, Bld 250 (*)    BUN 34 (*)    Creatinine, Ser 1.31 (*)    Calcium  11.2 (*)    GFR calc non Af Amer 45 (*)    GFR calc Af Amer 52 (*)    All other components within normal limits  LIPASE, BLOOD - Abnormal; Notable for the following:    Lipase 10 (*)    All other components within normal limits  URINALYSIS, ROUTINE W REFLEX MICROSCOPIC - Abnormal; Notable for the following:    Color, Urine AMBER (*)    APPearance CLOUDY (*)    Ketones, ur 15 (*)    Protein, ur 100 (*)    Leukocytes, UA MODERATE (*)    All other components within normal limits  URINE MICROSCOPIC-ADD ON - Abnormal; Notable for the following:    Squamous Epithelial / LPF MANY (*)    Bacteria, UA MANY (*)    Casts HYALINE CASTS (*)    All other components within normal limits  CBG MONITORING, ED - Abnormal; Notable for the following:    Glucose-Capillary 227 (*)    All other components within normal limits  I-STAT TROPOININ, ED   Imaging Review Ct Abdomen Pelvis W Contrast  01/12/2014   CLINICAL DATA:  Right-sided abdominal pain, nausea and vomiting.  EXAM: CT ABDOMEN AND PELVIS WITH CONTRAST  TECHNIQUE: Multidetector CT imaging of the abdomen and pelvis was performed using the standard protocol following bolus administration of intravenous contrast.  CONTRAST:  134mL OMNIPAQUE IOHEXOL 300 MG/ML  SOLN  COMPARISON:  CT of the abdomen and pelvis performed 10/15/2013, and MRI of the abdomen performed 11/10/2013  FINDINGS: The visualized lung bases are clear. Scattered coronary artery calcifications are seen.  There is diffuse dilatation of small bowel loops throughout the abdomen and pelvis, extending from the distal jejunum to the mid ileum. There appears to be a transition point at the right upper quadrant, with an air-filled segment of mid ileum interposed anterior to the liver. This may reflect adhesion or possibly internal hernia. Adjacent free fluid is seen tracking about the liver. A small amount of free fluid is also noted within the pelvis. Small bowel loops are dilated up to 4.8 cm in  maximal diameter. The distal small bowel loops are completely decompressed, compatible with a relatively high-grade small bowel obstruction.  The liver and spleen are unremarkable in appearance. The gallbladder is within normal limits. The pancreas and adrenal glands are unremarkable.  A 0.8 cm cyst is noted at the lower pole of the right kidney. The kidneys are otherwise unremarkable in appearance. There is no evidence of hydronephrosis. No renal or ureteral stones are seen. No perinephric stranding is appreciated.  No free fluid is identified. The small bowel is unremarkable in appearance. The stomach  is within normal limits. No acute vascular abnormalities are seen.  The appendix is normal in caliber, without evidence for appendicitis. The colon is largely decompressed and is unremarkable in appearance.  The bladder is mildly distended and grossly unremarkable. Multiple apparent fibroids are seen within the uterus; the uterus is otherwise unremarkable in appearance. The ovaries are relatively symmetric; no suspicious adnexal masses are seen. No inguinal lymphadenopathy is seen.  No acute osseous abnormalities are identified. Vacuum phenomenon and disc space narrowing are seen at L4-5, with an associated calcified disc protrusion. This is grossly unchanged from the prior study.  IMPRESSION: 1. Diffuse dilatation of small bowel loops throughout the abdomen and pelvis, extending from the distal jejunum to the ileum. Transition point at the right upper quadrant, with an air-filled segment of mid ileum interposed anterior to the liver. More distal small bowel loops are completely decompressed. This may reflect an adhesion or possibly internal hernia, with resultant high-grade small bowel obstruction. Small bowel loops are dilated up to 4.8 cm in maximal diameter. 2. Associated free fluid seen tracking about the liver and within the pelvis. 3. Apparent fibroid uterus noted. 4. Tiny right renal cyst seen. 5. Scattered  coronary artery calcifications noted.  These results were called by telephone at the time of interpretation on 01/12/2014 at 10:52 PM to Dr. Murlean Caller, who verbally acknowledged these results.   Electronically Signed   By: Garald Balding M.D.   On: 01/12/2014 22:48    EKG Interpretation    Date/Time:  [place] January 12 2014 21:19:46 EST Ventricular Rate:  99 PR Interval:  114 QRS Duration: 95 QT Interval:  369 QTC Calculation: 473 R Axis:   1 Text Interpretation:  Age not entered, assumed to be  58 years old for purpose of ECG interpretation Sinus rhythm Biatrial enlargement similar to prior Confirmed by Mingo Amber  MD, [place] ([id number]) on 01/12/2014 9:35:01 PM            MDM   Final diagnoses:  Small bowel obstruction  Abdominal pain  Nausea and vomiting    58 year old female presents with abdominal pain. Abdominal pain and vomiting since yesterday. Numerous times of vomiting with burning epigastric pain. No trauma. No fevers. No diarrhea. Hard bowel movements, none today. Here relaxing comfortably, vitals are stable. On abdominal exam shows epigastric and right upper quadrant or right lower quadrant, suprapubic pain. Labs showed a white count. She has moderate leukocytes but many squames her UA. She denies dysuria both her suprapubic pain will treat with Rocephin. We'll obtain CT scan since she has multiple locations of her abdominal pain, not just one area. Concern for possible cholecystitis versus appendicitis versus other abdominal etiology. Will give antiemetics and pain meds. CT reviewed by me, reviewed by radiologist. High Grade SBO noted. NG tube placed. Surgery consulted, admitting.    Osvaldo Shipper, MD 01/12/14 [phone]

## 2014-01-13 ENCOUNTER — Inpatient Hospital Stay (HOSPITAL_COMMUNITY): Payer: Medicaid Other

## 2014-01-13 DIAGNOSIS — K565 Intestinal adhesions [bands], unspecified as to partial versus complete obstruction: Secondary | ICD-10-CM

## 2014-01-13 DIAGNOSIS — E119 Type 2 diabetes mellitus without complications: Secondary | ICD-10-CM

## 2014-01-13 DIAGNOSIS — K56609 Unspecified intestinal obstruction, unspecified as to partial versus complete obstruction: Secondary | ICD-10-CM | POA: Diagnosis present

## 2014-01-13 HISTORY — DX: Unspecified intestinal obstruction, unspecified as to partial versus complete obstruction: K56.609

## 2014-01-13 LAB — GLUCOSE, CAPILLARY
GLUCOSE-CAPILLARY: 185 mg/dL — AB (ref 70–99)
GLUCOSE-CAPILLARY: 147 mg/dL — AB (ref 70–99)
GLUCOSE-CAPILLARY: 120 mg/dL — AB (ref 70–99)
GLUCOSE-CAPILLARY: 114 mg/dL — AB (ref 70–99)
Glucose-Capillary: 156 mg/dL — ABNORMAL HIGH (ref 70–99)
Glucose-Capillary: 147 mg/dL — ABNORMAL HIGH (ref 70–99)

## 2014-01-13 LAB — CBC
HCT: 38.3 % (ref 36.0–46.0)
HEMOGLOBIN: 13.1 g/dL (ref 12.0–15.0)
MCH: 27.3 pg (ref 26.0–34.0)
MCHC: 34.2 g/dL (ref 30.0–36.0)
MCV: 80 fL (ref 78.0–100.0)
PLATELETS: 337 10*3/uL (ref 150–400)
RBC: 4.79 MIL/uL (ref 3.87–5.11)
RDW: 13.2 % (ref 11.5–15.5)
WBC: 5.6 10*3/uL (ref 4.0–10.5)

## 2014-01-13 LAB — BASIC METABOLIC PANEL
BUN: 42 mg/dL — ABNORMAL HIGH (ref 6–23)
CO2: 36 mEq/L — ABNORMAL HIGH (ref 19–32)
Calcium: 9.7 mg/dL (ref 8.4–10.5)
Chloride: 88 mEq/L — ABNORMAL LOW (ref 96–112)
Creatinine, Ser: 1.3 mg/dL — ABNORMAL HIGH (ref 0.50–1.10)
GFR, EST AFRICAN AMERICAN: 52 mL/min — AB (ref >90)
GFR, EST NON AFRICAN AMERICAN: 45 mL/min — AB (ref >90)
Glucose, Bld: 170 mg/dL — ABNORMAL HIGH (ref 70–99)
POTASSIUM: 4.2 meq/L (ref 3.7–5.3)
SODIUM: 138 meq/L (ref 137–147)

## 2014-01-13 MED ORDER — ENOXAPARIN SODIUM 40 MG/0.4ML ~~LOC~~ SOLN
40.0000 mg | Freq: Every day | SUBCUTANEOUS | Status: DC
  Administered 2014-01-13 – 2014-01-14 (×2): 40 mg via SUBCUTANEOUS
  Filled 2014-01-13 (×3): qty 0.4

## 2014-01-13 MED ORDER — BIOTENE DRY MOUTH MT LIQD
15.0000 mL | Freq: Two times a day (BID) | OROMUCOSAL | Status: DC
  Administered 2014-01-13 – 2014-01-18 (×10): 15 mL via OROMUCOSAL

## 2014-01-13 MED ORDER — SODIUM CHLORIDE 0.9 % IV SOLN
INTRAVENOUS | Status: DC
  Administered 2014-01-13 – 2014-01-14 (×6): via INTRAVENOUS

## 2014-01-13 MED ORDER — PANTOPRAZOLE SODIUM 40 MG IV SOLR
40.0000 mg | Freq: Every day | INTRAVENOUS | Status: DC
  Administered 2014-01-13 – 2014-01-18 (×7): 40 mg via INTRAVENOUS
  Filled 2014-01-13 (×10): qty 40

## 2014-01-13 MED ORDER — ACETAMINOPHEN 325 MG PO TABS: 650.0000 mg | ORAL_TABLET | Freq: Four times a day (QID) | ORAL | Status: DC | PRN

## 2014-01-13 MED ORDER — INSULIN ASPART 100 UNIT/ML ~~LOC~~ SOLN
0.0000 [IU] | Freq: Three times a day (TID) | SUBCUTANEOUS | Status: DC
  Administered 2014-01-15 (×2): 3 [IU] via SUBCUTANEOUS

## 2014-01-13 MED ORDER — HYDRALAZINE HCL 20 MG/ML IJ SOLN
10.0000 mg | INTRAMUSCULAR | Status: DC | PRN
  Administered 2014-01-13 – 2014-01-15 (×3): 10 mg via INTRAVENOUS
  Filled 2014-01-13 (×3): qty 1

## 2014-01-13 MED ORDER — ONDANSETRON HCL 4 MG/2ML IJ SOLN
4.0000 mg | Freq: Four times a day (QID) | INTRAMUSCULAR | Status: DC | PRN
Start: 2014-01-13 — End: 2014-01-20
  Administered 2014-01-13 – 2014-01-17 (×2): 4 mg via INTRAVENOUS
  Filled 2014-01-13 (×2): qty 2

## 2014-01-13 MED ORDER — ACETAMINOPHEN 650 MG RE SUPP: 650.0000 mg | Freq: Four times a day (QID) | RECTAL | Status: DC | PRN

## 2014-01-13 MED ORDER — MORPHINE SULFATE 2 MG/ML IJ SOLN
2.0000 mg | INTRAMUSCULAR | Status: DC | PRN
  Administered 2014-01-13 – 2014-01-15 (×8): 2 mg via INTRAVENOUS
  Filled 2014-01-13 (×8): qty 1

## 2014-01-13 NOTE — Progress Notes (Signed)
Pt BP 183/90, HR 97, NPO with NG tube to low-int suction.  Spoke with Dr. Brantley Stage, will give hydrazine IV prn.

## 2014-01-13 NOTE — Progress Notes (Signed)
Patient stated she is hurting too much at this time to walk in the hall.

## 2014-01-13 NOTE — Progress Notes (Signed)
Pt ambulated from her room, to the nurse's station, and back to her room. Pt tolerated well.  Alicia [name], Mance [place]

## 2014-01-13 NOTE — Progress Notes (Signed)
INITIAL NUTRITION ASSESSMENT  DOCUMENTATION CODES Per approved criteria  -Not Applicable   INTERVENTION: Advance diet as medically appropriate  RD to follow for nutrition care plan, add interventions accordingly  NUTRITION DIAGNOSIS: Inadequate oral intake related to altered GI function as evidenced by NPO status  Goal: Pt to meet >/= 90% of their estimated nutrition needs   Monitor:  PO diet advancement & intake, weight, labs, I/O's  Reason for Assessment: Malnutrition Screening Tool Report  58 y.o. female  Admitting Dx: abdominal pain  ASSESSMENT: 33 yof who was admitted for what she states was "food poisoning" in November with ct showing ileus. Since then she has had constipation. She has not had a bm in a week. Over past few days she has had increasing abdominal pain associated with n/v. This was not getting better. She has passed a little flatus during this and even today she says. Nothing she was doing at home was making better. Has not been eating.   She has no history of colonoscopy. She does state she has some unknown cardiac issue which she terms an "irregular heartbeat". Also says she has had several strokes but no etiology was identified and no other treatment instituted.  Patient with NGT to LIS in place and in pain; reports she hasn't really eaten much since Thursday evening; was feeling very "backed up" and had no relief with stool softeners/laxatives; CT of abdomen/pelvis showed diffuse dilatation of small loops throughout abdomen and pelvis -- reflected adhesion or possible internal hernia, with resultant SBO; per readings pt's weight has been stable.  RD unable to complete Nutrition Focused Physical Exam at this time.  Height: Ht Readings from Last 1 Encounters:  01/13/14 5\' 6"  (1.676 m)    Weight: Wt Readings from Last 1 Encounters:  01/13/14 128 lb 1.4 oz (58.1 kg)    Ideal Body Weight: 130 lb  % Ideal Body Weight: 98%  Wt Readings from Last 10  Encounters:  01/13/14 128 lb 1.4 oz (58.1 kg)  11/20/13 128 lb 6.4 oz (58.242 kg)  11/01/13 130 lb 6.4 oz (59.149 kg)  10/15/13 128 lb 14.4 oz (58.469 kg)    Usual Body Weight: 130 lb  % Usual Body Weight: 98%  BMI:  Body mass index is 20.68 kg/(m^2).  Estimated Nutritional Needs: Kcal: 5329-9242 Protein: 85-95 gm Fluid: 1.7-1.9 L  Skin: Intact  Diet Order: NPO  EDUCATION NEEDS: -No education needs identified at this time   Intake/Output Summary (Last 24 hours) at 01/13/14 1428 Last data filed at 01/13/14 0657  Gross per 24 hour  Intake 735.42 ml  Output    425 ml  Net 310.42 ml    Labs:   Recent Labs Lab 01/12/14 1954 01/13/14 0405  NA 135* 138  K 4.2 4.2  CL 81* 88*  CO2 37* 36*  BUN 34* 42*  CREATININE 1.31* 1.30*  CALCIUM 11.2* 9.7  GLUCOSE 250* 170*    CBG (last 3)   Recent Labs  01/13/14 0423 01/13/14 0725 01/13/14 1130  GLUCAP 156* 147* 147*    Scheduled Meds: . antiseptic oral rinse  15 mL Mouth Rinse BID  . enoxaparin (LOVENOX) injection  40 mg Subcutaneous Daily  . insulin aspart  0-15 Units Subcutaneous TID WC  . pantoprazole (PROTONIX) IV  40 mg Intravenous QHS    Continuous Infusions: . sodium chloride 175 mL/hr at 01/13/14 1210    Past Medical History  Diagnosis Date  . Diabetes mellitus, type II   . Hypertension   .  H/O: CVA (cerebrovascular accident)   . CHF (congestive heart failure)     Past Surgical History  Procedure Laterality Date  . Tubal ligation    . Cardiac catheterization      Arthur Holms, RD, LDN Pager #: [phone] After-Hours Pager #: [phone]

## 2014-01-13 NOTE — Progress Notes (Addendum)
Subjective: Stable and alert. Very talkative and interactive. States that she has passed flatus several times but no stool. States pain is better but not resolved.  Abdominal x-rays show a general paucity of gas. Suspect small bowel loops are fluid rather than air-filled.  NG output 425 cc.WBC 5600. Hemoglobin 13.1. Sodium 138. Potassium 4.2. Chloride 88. CO2 42. Creatinine 1.3. Objective: Vital signs in last 24 hours: Temp:  [97.5 F (36.4 C)-98.8 F (37.1 C)] 98.1 F (36.7 C) (02/21 0828) Pulse Rate:  [57-98] 72 (02/21 0828) Resp:  [16-19] 16 (02/21 0828) BP: (104-140)/(60-93) 114/60 mmHg (02/21 0828) SpO2:  [92 %-100 %] 99 % (02/21 0828) Weight:  [128 lb 1.4 oz (58.1 kg)-129 lb (58.514 kg)] 128 lb 1.4 oz (58.1 kg) (02/21 0119) Last BM Date: 01/06/14  Intake/Output from previous day: 02/20 0701 - 02/21 0700 In: 735.4 [I.V.:735.4] Out: 425 [Emesis/NG output:425] Intake/Output this shift:    General appearance: alert. Appears comfortable. Very talkative and animated. Doesn't appear to be in any distress. Resp: clear to auscultation bilaterally GI: abdomen soft. Borderline distended. Diffuse tenderness but no peritoneal signs. Hypoactive bowel sounds.  Lab Results:   Recent Labs  01/12/14 1954 01/13/14 0405  WBC 9.4 5.6  HGB 14.6 13.1  HCT 41.7 38.3  PLT 382 337   BMET  Recent Labs  01/12/14 1954 01/13/14 0405  NA 135* 138  K 4.2 4.2  CL 81* 88*  CO2 37* 36*  GLUCOSE 250* 170*  BUN 34* 42*  CREATININE 1.31* 1.30*  CALCIUM 11.2* 9.7   PT/INR No results found for this basename: LABPROT, INR,  in the last 72 hours ABG No results found for this basename: PHART, PCO2, PO2, HCO3,  in the last 72 hours  Studies/Results: Ct Abdomen Pelvis W Contrast  01/12/2014   CLINICAL DATA:  Right-sided abdominal pain, nausea and vomiting.  EXAM: CT ABDOMEN AND PELVIS WITH CONTRAST  TECHNIQUE: Multidetector CT imaging of the abdomen and pelvis was performed using the  standard protocol following bolus administration of intravenous contrast.  CONTRAST:  142mL OMNIPAQUE IOHEXOL 300 MG/ML  SOLN  COMPARISON:  CT of the abdomen and pelvis performed 10/15/2013, and MRI of the abdomen performed 11/10/2013  FINDINGS: The visualized lung bases are clear. Scattered coronary artery calcifications are seen.  There is diffuse dilatation of small bowel loops throughout the abdomen and pelvis, extending from the distal jejunum to the mid ileum. There appears to be a transition point at the right upper quadrant, with an air-filled segment of mid ileum interposed anterior to the liver. This may reflect adhesion or possibly internal hernia. Adjacent free fluid is seen tracking about the liver. A small amount of free fluid is also noted within the pelvis. Small bowel loops are dilated up to 4.8 cm in maximal diameter. The distal small bowel loops are completely decompressed, compatible with a relatively high-grade small bowel obstruction.  The liver and spleen are unremarkable in appearance. The gallbladder is within normal limits. The pancreas and adrenal glands are unremarkable.  A 0.8 cm cyst is noted at the lower pole of the right kidney. The kidneys are otherwise unremarkable in appearance. There is no evidence of hydronephrosis. No renal or ureteral stones are seen. No perinephric stranding is appreciated.  No free fluid is identified. The small bowel is unremarkable in appearance. The stomach is within normal limits. No acute vascular abnormalities are seen.  The appendix is normal in caliber, without evidence for appendicitis. The colon is largely decompressed and is  unremarkable in appearance.  The bladder is mildly distended and grossly unremarkable. Multiple apparent fibroids are seen within the uterus; the uterus is otherwise unremarkable in appearance. The ovaries are relatively symmetric; no suspicious adnexal masses are seen. No inguinal lymphadenopathy is seen.  No acute osseous  abnormalities are identified. Vacuum phenomenon and disc space narrowing are seen at L4-5, with an associated calcified disc protrusion. This is grossly unchanged from the prior study.  IMPRESSION: 1. Diffuse dilatation of small bowel loops throughout the abdomen and pelvis, extending from the distal jejunum to the ileum. Transition point at the right upper quadrant, with an air-filled segment of mid ileum interposed anterior to the liver. More distal small bowel loops are completely decompressed. This may reflect an adhesion or possibly internal hernia, with resultant high-grade small bowel obstruction. Small bowel loops are dilated up to 4.8 cm in maximal diameter. 2. Associated free fluid seen tracking about the liver and within the pelvis. 3. Apparent fibroid uterus noted. 4. Tiny right renal cyst seen. 5. Scattered coronary artery calcifications noted.  These results were called by telephone at the time of interpretation on 01/12/2014 at 10:52 PM to Dr. Murlean Caller, who verbally acknowledged these results.   Electronically Signed   By: Garald Balding M.D.   On: 01/12/2014 22:48    Anti-infectives: Anti-infectives   Start     Dose/Rate Route Frequency Ordered Stop   01/12/14 2115  cefTRIAXone (ROCEPHIN) 1 g in dextrose 5 % 50 mL IVPB     1 g 100 mL/hr over 30 Minutes Intravenous  Once 01/12/14 2110 01/12/14 2200      Assessment/Plan:  Partial small bowel obstruction. Etiology unclear since only had tubal ligation in past. No clear-cut internal or external hernia. No signs of bowel compromise. Continue NG suction and medical management. Repeat lab work and abdominal films tomorrow Ambulate frequently  Still seems a little dehydrated with electrolyte abnormalities. Will increase IV fluids until tomorrow morning.  DM-SSI HTN-controlled at present H/o CVA   LOS: 1 day    Elfie Costanza M. Dalbert Batman, M.D., [facility] General and Minimally invasive Surgery Breast and  Colorectal Surgery Office:   [phone]   01/13/2014

## 2014-01-13 NOTE — H&P (Signed)
Alicia [name] is an 58 y.o. female.   Chief Complaint: abdominal pain HPI: 73 yof who was admitted for what she states was "food poisoning" in November with ct showing ileus. Since then she has had constipation. She has not had a bm in a week.  Over past few days she has had increasing abdominal pain associated with n/v.  This was not getting better. She has passed a little flatus during this and even today she says.  Nothing she was doing at home was making better. Has not been eating.   She has no history of colonoscopy.  She does state she has some unknown cardiac issue which she terms an "irregular heartbeat".  Also says she has had several strokes but no etiology was identified and no other treatment instituted.  Past Medical History  Diagnosis Date  . Diabetes mellitus, type II   . Hypertension   . H/O: CVA (cerebrovascular accident)   . CHF (congestive heart failure)     Past Surgical History  Procedure Laterality Date  . Tubal ligation    . Cardiac catheterization      Family History  Problem Relation Age of Onset  . Diabetes Mellitus II Mother   . Hypertension Mother   . CVA Mother    Social History:  reports that she has been smoking Cigarettes.  She has a 12.5 pack-year smoking history. She has never used smokeless tobacco. She reports that she does not drink alcohol or use illicit drugs.  Allergies:  Allergies  Allergen Reactions  . Asa [Aspirin] Other (See Comments)    Told not to take med-per MD  . Other Other (See Comments)    Told not to take any dairy products-per MD    meds glucophage, lisinopril  Results for orders placed during the hospital encounter of 01/12/14 (from the past 48 hour(s))  CBC WITH DIFFERENTIAL     Status: Abnormal   Collection Time    01/12/14  7:54 PM      Result Value Ref Range   WBC 9.4  4.0 - 10.5 K/uL   RBC 5.21 (*) 3.87 - 5.11 MIL/uL   Hemoglobin 14.6  12.0 - 15.0 g/dL   HCT 41.7  36.0 - 46.0 %   MCV 80.0  78.0 - 100.0 fL    MCH 28.0  26.0 - 34.0 pg   MCHC 35.0  30.0 - 36.0 g/dL   RDW 13.2  11.5 - 15.5 %   Platelets 382  150 - 400 K/uL   Neutrophils Relative % 83 (*) 43 - 77 %   Neutro Abs 7.8 (*) 1.7 - 7.7 K/uL   Lymphocytes Relative 10 (*) 12 - 46 %   Lymphs Abs 0.9  0.7 - 4.0 K/uL   Monocytes Relative 7  3 - 12 %   Monocytes Absolute 0.7  0.1 - 1.0 K/uL   Eosinophils Relative 0  0 - 5 %   Eosinophils Absolute 0.0  0.0 - 0.7 K/uL   Basophils Relative 0  0 - 1 %   Basophils Absolute 0.0  0.0 - 0.1 K/uL  COMPREHENSIVE METABOLIC PANEL     Status: Abnormal   Collection Time    01/12/14  7:54 PM      Result Value Ref Range   Sodium 135 (*) 137 - 147 mEq/L   Potassium 4.2  3.7 - 5.3 mEq/L   Chloride 81 (*) 96 - 112 mEq/L   CO2 37 (*) 19 - 32 mEq/L  Glucose, Bld 250 (*) 70 - 99 mg/dL   BUN 34 (*) 6 - 23 mg/dL   Creatinine, Ser 1.31 (*) 0.50 - 1.10 mg/dL   Calcium 11.2 (*) 8.4 - 10.5 mg/dL   Total Protein 8.0  6.0 - 8.3 g/dL   Albumin 3.7  3.5 - 5.2 g/dL   AST 19  0 - 37 U/L   ALT 15  0 - 35 U/L   Alkaline Phosphatase 75  39 - 117 U/L   Total Bilirubin 0.5  0.3 - 1.2 mg/dL   GFR calc non Af Amer 45 (*) >90 mL/min   GFR calc Af Amer 52 (*) >90 mL/min   Comment: (NOTE)     The eGFR has been calculated using the CKD EPI equation.     This calculation has not been validated in all clinical situations.     eGFR's persistently <90 mL/min signify possible Chronic Kidney     Disease.  LIPASE, BLOOD     Status: Abnormal   Collection Time    01/12/14  7:54 PM      Result Value Ref Range   Lipase 10 (*) 11 - 59 U/L  CBG MONITORING, ED     Status: Abnormal   Collection Time    01/12/14  7:55 PM      Result Value Ref Range   Glucose-Capillary 227 (*) 70 - 99 mg/dL  URINALYSIS, ROUTINE W REFLEX MICROSCOPIC     Status: Abnormal   Collection Time    01/12/14  8:13 PM      Result Value Ref Range   Color, Urine AMBER (*) YELLOW   Comment: BIOCHEMICALS MAY BE AFFECTED BY COLOR   APPearance CLOUDY (*)  CLEAR   Specific Gravity, Urine 1.020  1.005 - 1.030   pH 6.0  5.0 - 8.0   Glucose, UA NEGATIVE  NEGATIVE mg/dL   Hgb urine dipstick NEGATIVE  NEGATIVE   Bilirubin Urine NEGATIVE  NEGATIVE   Ketones, ur 15 (*) NEGATIVE mg/dL   Protein, ur 100 (*) NEGATIVE mg/dL   Urobilinogen, UA 0.2  0.0 - 1.0 mg/dL   Nitrite NEGATIVE  NEGATIVE   Leukocytes, UA MODERATE (*) NEGATIVE  URINE MICROSCOPIC-ADD ON     Status: Abnormal   Collection Time    01/12/14  8:13 PM      Result Value Ref Range   Squamous Epithelial / LPF MANY (*) RARE   WBC, UA 7-10  <3 WBC/hpf   Bacteria, UA MANY (*) RARE   Casts HYALINE CASTS (*) NEGATIVE  I-STAT TROPOININ, ED     Status: None   Collection Time    01/12/14  9:37 PM      Result Value Ref Range   Troponin i, poc 0.04  0.00 - 0.08 ng/mL   Comment 3            Comment: Due to the release kinetics of cTnI,     a negative result within the first hours     of the onset of symptoms does not rule out     myocardial infarction with certainty.     If myocardial infarction is still suspected,     repeat the test at appropriate intervals.   Ct Abdomen Pelvis W Contrast  01/12/2014   CLINICAL DATA:  Right-sided abdominal pain, nausea and vomiting.  EXAM: CT ABDOMEN AND PELVIS WITH CONTRAST  TECHNIQUE: Multidetector CT imaging of the abdomen and pelvis was performed using the standard protocol following bolus administration of  intravenous contrast.  CONTRAST:  137m OMNIPAQUE IOHEXOL 300 MG/ML  SOLN  COMPARISON:  CT of the abdomen and pelvis performed 10/15/2013, and MRI of the abdomen performed 11/10/2013  FINDINGS: The visualized lung bases are clear. Scattered coronary artery calcifications are seen.  There is diffuse dilatation of small bowel loops throughout the abdomen and pelvis, extending from the distal jejunum to the mid ileum. There appears to be a transition point at the right upper quadrant, with an air-filled segment of mid ileum interposed anterior to the liver.  This may reflect adhesion or possibly internal hernia. Adjacent free fluid is seen tracking about the liver. A small amount of free fluid is also noted within the pelvis. Small bowel loops are dilated up to 4.8 cm in maximal diameter. The distal small bowel loops are completely decompressed, compatible with a relatively high-grade small bowel obstruction.  The liver and spleen are unremarkable in appearance. The gallbladder is within normal limits. The pancreas and adrenal glands are unremarkable.  A 0.8 cm cyst is noted at the lower pole of the right kidney. The kidneys are otherwise unremarkable in appearance. There is no evidence of hydronephrosis. No renal or ureteral stones are seen. No perinephric stranding is appreciated.  No free fluid is identified. The small bowel is unremarkable in appearance. The stomach is within normal limits. No acute vascular abnormalities are seen.  The appendix is normal in caliber, without evidence for appendicitis. The colon is largely decompressed and is unremarkable in appearance.  The bladder is mildly distended and grossly unremarkable. Multiple apparent fibroids are seen within the uterus; the uterus is otherwise unremarkable in appearance. The ovaries are relatively symmetric; no suspicious adnexal masses are seen. No inguinal lymphadenopathy is seen.  No acute osseous abnormalities are identified. Vacuum phenomenon and disc space narrowing are seen at L4-5, with an associated calcified disc protrusion. This is grossly unchanged from the prior study.  IMPRESSION: 1. Diffuse dilatation of small bowel loops throughout the abdomen and pelvis, extending from the distal jejunum to the ileum. Transition point at the right upper quadrant, with an air-filled segment of mid ileum interposed anterior to the liver. More distal small bowel loops are completely decompressed. This may reflect an adhesion or possibly internal hernia, with resultant high-grade small bowel obstruction.  Small bowel loops are dilated up to 4.8 cm in maximal diameter. 2. Associated free fluid seen tracking about the liver and within the pelvis. 3. Apparent fibroid uterus noted. 4. Tiny right renal cyst seen. 5. Scattered coronary artery calcifications noted.  These results were called by telephone at the time of interpretation on 01/12/2014 at 10:52 PM to Dr. WMurlean Caller who verbally acknowledged these results.   Electronically Signed   By: JGarald BaldingM.D.   On: 01/12/2014 22:48    Review of Systems  Constitutional: Negative for fever and chills.  Respiratory: Negative for shortness of breath.   Cardiovascular: Negative for chest pain.  Gastrointestinal: Positive for nausea, vomiting, abdominal pain and constipation. Negative for diarrhea.    Blood pressure 104/69, pulse 66, temperature 97.5 F (36.4 C), temperature source Oral, resp. rate 18, weight 129 lb (58.514 kg), SpO2 100.00%. Physical Exam  Vitals reviewed. Constitutional: She is oriented to person, place, and time. She appears well-developed and well-nourished.  Eyes: No scleral icterus.  Neck: Neck supple.  Cardiovascular: Regular rhythm.  Tachycardia present.   Respiratory: Effort normal and breath sounds normal. She has no wheezes. She has no rales.  GI: Normal appearance. She  exhibits distension. Bowel sounds are decreased. There is generalized tenderness. There is no rebound. No hernia.  Lymphadenopathy:    She has no cervical adenopathy.  Neurological: She is alert and oriented to person, place, and time.     Assessment/Plan psbo  She has only had btl so not entirely sure of etiology.  She does not have urgent indication for surgery. She is better after ng placement.  Will plan on conservative therapy with ng, repeat films and labs in am.  We discussed surgery as possibility if this does not resolve on its own.  Zineb Glade 01/13/2014, 12:15 AM

## 2014-01-14 ENCOUNTER — Inpatient Hospital Stay (HOSPITAL_COMMUNITY): Payer: Medicaid Other

## 2014-01-14 LAB — CBC WITH DIFFERENTIAL/PLATELET
BASOS ABS: 0 10*3/uL (ref 0.0–0.1)
Basophils Relative: 0 % (ref 0–1)
EOS ABS: 0 10*3/uL (ref 0.0–0.7)
EOS PCT: 0 % (ref 0–5)
HCT: 39 % (ref 36.0–46.0)
Hemoglobin: 13 g/dL (ref 12.0–15.0)
LYMPHS PCT: 14 % (ref 12–46)
Lymphs Abs: 1.2 10*3/uL (ref 0.7–4.0)
MCH: 27.7 pg (ref 26.0–34.0)
MCHC: 33.3 g/dL (ref 30.0–36.0)
MCV: 83 fL (ref 78.0–100.0)
Monocytes Absolute: 0.9 10*3/uL (ref 0.1–1.0)
Monocytes Relative: 11 % (ref 3–12)
NEUTROS PCT: 75 % (ref 43–77)
Neutro Abs: 6.3 10*3/uL (ref 1.7–7.7)
PLATELETS: 347 10*3/uL (ref 150–400)
RBC: 4.7 MIL/uL (ref 3.87–5.11)
RDW: 13.6 % (ref 11.5–15.5)
WBC: 8.4 10*3/uL (ref 4.0–10.5)

## 2014-01-14 LAB — GLUCOSE, CAPILLARY
GLUCOSE-CAPILLARY: 139 mg/dL — AB (ref 70–99)
Glucose-Capillary: 81 mg/dL (ref 70–99)
Glucose-Capillary: 78 mg/dL (ref 70–99)
Glucose-Capillary: 66 mg/dL — ABNORMAL LOW (ref 70–99)
Glucose-Capillary: 132 mg/dL — ABNORMAL HIGH (ref 70–99)
Glucose-Capillary: 142 mg/dL — ABNORMAL HIGH (ref 70–99)

## 2014-01-14 LAB — BASIC METABOLIC PANEL
BUN: 24 mg/dL — ABNORMAL HIGH (ref 6–23)
CALCIUM: 9.2 mg/dL (ref 8.4–10.5)
CO2: 29 meq/L (ref 19–32)
CREATININE: 0.52 mg/dL (ref 0.50–1.10)
Chloride: 96 mEq/L (ref 96–112)
GFR calc Af Amer: >90 mL/min (ref >90)
GFR calc non Af Amer: >90 mL/min (ref >90)
Glucose, Bld: 83 mg/dL (ref 70–99)
Potassium: 3.6 mEq/L — ABNORMAL LOW (ref 3.7–5.3)
SODIUM: 143 meq/L (ref 137–147)

## 2014-01-14 MED ORDER — DEXTROSE 50 % IV SOLN
25.0000 mL | Freq: Once | INTRAVENOUS | Status: AC | PRN
  Administered 2014-01-14: 25 mL via INTRAVENOUS

## 2014-01-14 MED ORDER — BISACODYL 10 MG RE SUPP
10.0000 mg | Freq: Once | RECTAL | Status: AC
Start: 2014-01-14 — End: 2014-01-14
  Administered 2014-01-14: 10 mg via RECTAL
  Filled 2014-01-14: qty 1

## 2014-01-14 MED ORDER — DEXTROSE 50 % IV SOLN
INTRAVENOUS | Status: AC
  Administered 2014-01-14: 25 mL via INTRAVENOUS
  Filled 2014-01-14: qty 50

## 2014-01-14 MED ORDER — KCL IN DEXTROSE-NACL 20-5-0.45 MEQ/L-%-% IV SOLN
INTRAVENOUS | Status: DC
  Administered 2014-01-14 – 2014-01-15 (×4): via INTRAVENOUS
  Filled 2014-01-14 (×4): qty 1000

## 2014-01-14 NOTE — Progress Notes (Signed)
Subjective: No real flatus or bm since yesterday, some abdominal pain  Objective: Vital signs in last 24 hours: Temp:  [97.9 F (36.6 C)-98.4 F (36.9 C)] 97.9 F (36.6 C) (02/22 1311) Pulse Rate:  [76-120] 111 (02/22 1311) Resp:  [16-18] 18 (02/22 1311) BP: (167-189)/(74-91) 189/85 mmHg (02/22 1311) SpO2:  [94 %-98 %] 98 % (02/22 1311) Last BM Date: 01/06/14  Intake/Output from previous day: 02/21 0701 - 02/22 0700 In: 3250 [I.V.:3250] Out: 1175 [Emesis/NG output:1175] Intake/Output this shift:    General appearance: no distress Resp: clear to auscultation bilaterally Cardio: regular rate and rhythm GI: mild distended, mildy tender throughout, some bs present  Lab Results:   Recent Labs  01/13/14 0405 01/14/14 0917  WBC 5.6 8.4  HGB 13.1 13.0  HCT 38.3 39.0  PLT 337 347   BMET  Recent Labs  01/13/14 0405 01/14/14 0917  NA 138 143  K 4.2 3.6*  CL 88* 96  CO2 36* 29  GLUCOSE 170* 83  BUN 42* 24*  CREATININE 1.30* 0.52  CALCIUM 9.7 9.2   PT/INR No results found for this basename: LABPROT, INR,  in the last 72 hours ABG No results found for this basename: PHART, PCO2, PO2, HCO3,  in the last 72 hours  Studies/Results: Ct Abdomen Pelvis W Contrast  01/12/2014   CLINICAL DATA:  Right-sided abdominal pain, nausea and vomiting.  EXAM: CT ABDOMEN AND PELVIS WITH CONTRAST  TECHNIQUE: Multidetector CT imaging of the abdomen and pelvis was performed using the standard protocol following bolus administration of intravenous contrast.  CONTRAST:  164mL OMNIPAQUE IOHEXOL 300 MG/ML  SOLN  COMPARISON:  CT of the abdomen and pelvis performed 10/15/2013, and MRI of the abdomen performed 11/10/2013  FINDINGS: The visualized lung bases are clear. Scattered coronary artery calcifications are seen.  There is diffuse dilatation of small bowel loops throughout the abdomen and pelvis, extending from the distal jejunum to the mid ileum. There appears to be a transition point at  the right upper quadrant, with an air-filled segment of mid ileum interposed anterior to the liver. This may reflect adhesion or possibly internal hernia. Adjacent free fluid is seen tracking about the liver. A small amount of free fluid is also noted within the pelvis. Small bowel loops are dilated up to 4.8 cm in maximal diameter. The distal small bowel loops are completely decompressed, compatible with a relatively high-grade small bowel obstruction.  The liver and spleen are unremarkable in appearance. The gallbladder is within normal limits. The pancreas and adrenal glands are unremarkable.  A 0.8 cm cyst is noted at the lower pole of the right kidney. The kidneys are otherwise unremarkable in appearance. There is no evidence of hydronephrosis. No renal or ureteral stones are seen. No perinephric stranding is appreciated.  No free fluid is identified. The small bowel is unremarkable in appearance. The stomach is within normal limits. No acute vascular abnormalities are seen.  The appendix is normal in caliber, without evidence for appendicitis. The colon is largely decompressed and is unremarkable in appearance.  The bladder is mildly distended and grossly unremarkable. Multiple apparent fibroids are seen within the uterus; the uterus is otherwise unremarkable in appearance. The ovaries are relatively symmetric; no suspicious adnexal masses are seen. No inguinal lymphadenopathy is seen.  No acute osseous abnormalities are identified. Vacuum phenomenon and disc space narrowing are seen at L4-5, with an associated calcified disc protrusion. This is grossly unchanged from the prior study.  IMPRESSION: 1. Diffuse dilatation of small  bowel loops throughout the abdomen and pelvis, extending from the distal jejunum to the ileum. Transition point at the right upper quadrant, with an air-filled segment of mid ileum interposed anterior to the liver. More distal small bowel loops are completely decompressed. This may  reflect an adhesion or possibly internal hernia, with resultant high-grade small bowel obstruction. Small bowel loops are dilated up to 4.8 cm in maximal diameter. 2. Associated free fluid seen tracking about the liver and within the pelvis. 3. Apparent fibroid uterus noted. 4. Tiny right renal cyst seen. 5. Scattered coronary artery calcifications noted.  These results were called by telephone at the time of interpretation on 01/12/2014 at 10:52 PM to Dr. Murlean Caller, who verbally acknowledged these results.   Electronically Signed   By: Garald Balding M.D.   On: 01/12/2014 22:48   Dg Abd 2 Views  01/14/2014   CLINICAL DATA:  Small bowel obstruction.  EXAM: ABDOMEN - 2 VIEW  COMPARISON:  01/13/2014  FINDINGS: A nasogastric tube remains in place with tip in the distal stomach. Several dilated small bowel loops are again seen in the lower abdomen which are nearly completely fluid-filled. Colon remains nondilated. These findings remain consistent with a small bowel obstruction.  IMPRESSION: Small bowel obstruction, with nearly completely fluid-filled loops of dilated small bowel.   Electronically Signed   By: Earle Gell M.D.   On: 01/14/2014 13:00   Dg Abd Portable 2v  01/13/2014   CLINICAL DATA:  Small bowel obstruction, followup  EXAM: PORTABLE ABDOMEN - 2 VIEW  COMPARISON:  01/12/2014 CT abdomen and pelvis  FINDINGS: Excreted contrast material within renal collecting systems and bladder.  Air-filled mildly dilated small bowel loops in mid abdomen compatible with persistent small bowel obstruction.  Small amount of gas and contrast present in colon.  No bowel wall thickening or free intraperitoneal air.  Nasogastric tube in stomach.  No acute osseous findings.  IMPRESSION: Persistent small bowel obstruction.   Electronically Signed   By: Lavonia Dana M.D.   On: 01/13/2014 15:47    Anti-infectives: Anti-infectives   Start     Dose/Rate Route Frequency Ordered Stop   01/12/14 2115  cefTRIAXone (ROCEPHIN)  1 g in dextrose 5 % 50 mL IVPB     1 g 100 mL/hr over 30 Minutes Intravenous  Once 01/12/14 2110 01/12/14 2200      Assessment/Plan: psbo  She is mildly tender today, no real output but contrast in her colon.  I think another day would be reasonable.  Will also decrease ivf Continue ssi for glucose Add k to iv fluids today also  Will check films in am and reassess for possible surgery   [facility] 01/14/2014

## 2014-01-14 NOTE — Progress Notes (Signed)
Hypoglycemic Event  CBG: 66  Treatment: D50 IV 25 mL  Symptoms: "I just knew it was low."  Follow-up CBG: OXBD:5329  CBG Result:139  Possible Reasons for Event: Inadequate meal intake  Comments/MD notified:Dr. Donne Hazel notified. No new orders.    Alicia [name]  Remember to initiate Hypoglycemia Order Set & complete

## 2014-01-15 ENCOUNTER — Inpatient Hospital Stay (HOSPITAL_COMMUNITY): Payer: Medicaid Other | Admitting: Anesthesiology

## 2014-01-15 ENCOUNTER — Encounter (HOSPITAL_COMMUNITY): Payer: Medicaid Other | Admitting: Anesthesiology

## 2014-01-15 ENCOUNTER — Encounter (HOSPITAL_COMMUNITY): Payer: Self-pay | Admitting: General Surgery

## 2014-01-15 ENCOUNTER — Encounter (HOSPITAL_COMMUNITY): Admission: EM | Disposition: A | Discharge status: Home / Self Care | Payer: Self-pay | Source: Home / Self Care

## 2014-01-15 DIAGNOSIS — F17200 Nicotine dependence, unspecified, uncomplicated: Secondary | ICD-10-CM

## 2014-01-15 DIAGNOSIS — K56609 Unspecified intestinal obstruction, unspecified as to partial versus complete obstruction: Secondary | ICD-10-CM

## 2014-01-15 DIAGNOSIS — E1165 Type 2 diabetes mellitus with hyperglycemia: Secondary | ICD-10-CM

## 2014-01-15 DIAGNOSIS — IMO0001 Reserved for inherently not codable concepts without codable children: Secondary | ICD-10-CM

## 2014-01-15 HISTORY — DX: Nicotine dependence, unspecified, uncomplicated: F17.200

## 2014-01-15 HISTORY — PX: LAPAROTOMY: SHX154

## 2014-01-15 HISTORY — PX: LYSIS OF ADHESION: SHX5961

## 2014-01-15 LAB — CBC
HCT: 38.3 % (ref 36.0–46.0)
Hemoglobin: 12.9 g/dL (ref 12.0–15.0)
MCH: 27.6 pg (ref 26.0–34.0)
MCHC: 33.7 g/dL (ref 30.0–36.0)
MCV: 81.8 fL (ref 78.0–100.0)
PLATELETS: 309 10*3/uL (ref 150–400)
RBC: 4.68 MIL/uL (ref 3.87–5.11)
RDW: 13.3 % (ref 11.5–15.5)
WBC: 7.7 10*3/uL (ref 4.0–10.5)

## 2014-01-15 LAB — GLUCOSE, CAPILLARY
GLUCOSE-CAPILLARY: 197 mg/dL — AB (ref 70–99)
GLUCOSE-CAPILLARY: 117 mg/dL — AB (ref 70–99)
Glucose-Capillary: 186 mg/dL — ABNORMAL HIGH (ref 70–99)
Glucose-Capillary: 190 mg/dL — ABNORMAL HIGH (ref 70–99)
Glucose-Capillary: 143 mg/dL — ABNORMAL HIGH (ref 70–99)

## 2014-01-15 LAB — BASIC METABOLIC PANEL
BUN: 11 mg/dL (ref 6–23)
CO2: 26 meq/L (ref 19–32)
Calcium: 9 mg/dL (ref 8.4–10.5)
Chloride: 95 mEq/L — ABNORMAL LOW (ref 96–112)
Creatinine, Ser: 0.37 mg/dL — ABNORMAL LOW (ref 0.50–1.10)
GFR calc Af Amer: >90 mL/min (ref >90)
GFR calc non Af Amer: >90 mL/min (ref >90)
Glucose, Bld: 211 mg/dL — ABNORMAL HIGH (ref 70–99)
Potassium: 3.3 mEq/L — ABNORMAL LOW (ref 3.7–5.3)
Sodium: 135 mEq/L — ABNORMAL LOW (ref 137–147)

## 2014-01-15 LAB — SURGICAL PCR SCREEN
MRSA, PCR: POSITIVE — AB
STAPHYLOCOCCUS AUREUS: POSITIVE — AB

## 2014-01-15 SURGERY — LAPAROTOMY, EXPLORATORY
Anesthesia: General | Site: Abdomen

## 2014-01-15 MED ORDER — METHOCARBAMOL 100 MG/ML IJ SOLN
500.0000 mg | Freq: Four times a day (QID) | INTRAMUSCULAR | Status: DC | PRN
  Filled 2014-01-15: qty 5

## 2014-01-15 MED ORDER — PROPOFOL 10 MG/ML IV BOLUS
INTRAVENOUS | Status: AC
  Filled 2014-01-15: qty 20

## 2014-01-15 MED ORDER — HYDROMORPHONE 0.3 MG/ML IV SOLN
INTRAVENOUS | Status: AC
  Filled 2014-01-15: qty 25

## 2014-01-15 MED ORDER — DEXAMETHASONE SODIUM PHOSPHATE 4 MG/ML IJ SOLN
INTRAMUSCULAR | Status: AC
  Filled 2014-01-15: qty 1

## 2014-01-15 MED ORDER — ROCURONIUM BROMIDE 50 MG/5ML IV SOLN
INTRAVENOUS | Status: AC
  Filled 2014-01-15: qty 1

## 2014-01-15 MED ORDER — FENTANYL CITRATE 0.05 MG/ML IJ SOLN
INTRAMUSCULAR | Status: DC | PRN
  Administered 2014-01-15: 100 ug via INTRAVENOUS

## 2014-01-15 MED ORDER — POTASSIUM CHLORIDE IN NACL 20-0.9 MEQ/L-% IV SOLN
INTRAVENOUS | Status: DC
  Administered 2014-01-16 (×2): via INTRAVENOUS
  Filled 2014-01-15 (×7): qty 1000

## 2014-01-15 MED ORDER — KCL IN DEXTROSE-NACL 20-5-0.45 MEQ/L-%-% IV SOLN
INTRAVENOUS | Status: DC | PRN
  Administered 2014-01-15: 14:00:00 via INTRAVENOUS

## 2014-01-15 MED ORDER — NALOXONE HCL 0.4 MG/ML IJ SOLN: 0.4000 mg | INTRAMUSCULAR | Status: DC | PRN

## 2014-01-15 MED ORDER — SODIUM CHLORIDE 0.9 % IJ SOLN: 9.0000 mL | INTRAMUSCULAR | Status: DC | PRN

## 2014-01-15 MED ORDER — NEOSTIGMINE METHYLSULFATE 1 MG/ML IJ SOLN
INTRAMUSCULAR | Status: AC
  Filled 2014-01-15: qty 10

## 2014-01-15 MED ORDER — DIPHENHYDRAMINE HCL 50 MG/ML IJ SOLN: 12.5000 mg | Freq: Four times a day (QID) | INTRAMUSCULAR | Status: DC | PRN

## 2014-01-15 MED ORDER — LIDOCAINE HCL (CARDIAC) 20 MG/ML IV SOLN
INTRAVENOUS | Status: DC | PRN
  Administered 2014-01-15: 100 mg via INTRAVENOUS

## 2014-01-15 MED ORDER — GLYCOPYRROLATE 0.2 MG/ML IJ SOLN
INTRAMUSCULAR | Status: DC | PRN
  Administered 2014-01-15: 0.6 mg via INTRAVENOUS
  Administered 2014-01-15: 0.2 mg via INTRAVENOUS

## 2014-01-15 MED ORDER — NEOSTIGMINE METHYLSULFATE 1 MG/ML IJ SOLN
INTRAMUSCULAR | Status: DC | PRN
  Administered 2014-01-15: 4 mg via INTRAVENOUS
  Administered 2014-01-15: 1 mg via INTRAVENOUS

## 2014-01-15 MED ORDER — ALBUMIN HUMAN 5 % IV SOLN
INTRAVENOUS | Status: DC | PRN
  Administered 2014-01-15 (×2): via INTRAVENOUS

## 2014-01-15 MED ORDER — SUCCINYLCHOLINE CHLORIDE 20 MG/ML IJ SOLN
INTRAMUSCULAR | Status: AC
  Filled 2014-01-15: qty 1

## 2014-01-15 MED ORDER — MORPHINE SULFATE 4 MG/ML IJ SOLN
4.0000 mg | Freq: Once | INTRAMUSCULAR | Status: AC
  Administered 2014-01-15: 4 mg via INTRAVENOUS
  Filled 2014-01-15: qty 1

## 2014-01-15 MED ORDER — ENOXAPARIN SODIUM 40 MG/0.4ML ~~LOC~~ SOLN
40.0000 mg | Freq: Every day | SUBCUTANEOUS | Status: DC
  Administered 2014-01-16 – 2014-01-19 (×4): 40 mg via SUBCUTANEOUS
  Filled 2014-01-15 (×5): qty 0.4

## 2014-01-15 MED ORDER — LIDOCAINE HCL (CARDIAC) 20 MG/ML IV SOLN
INTRAVENOUS | Status: AC
  Filled 2014-01-15: qty 5

## 2014-01-15 MED ORDER — ROCURONIUM BROMIDE 100 MG/10ML IV SOLN
INTRAVENOUS | Status: DC | PRN
  Administered 2014-01-15: 40 mg via INTRAVENOUS

## 2014-01-15 MED ORDER — ACETAMINOPHEN 10 MG/ML IV SOLN
1000.0000 mg | Freq: Four times a day (QID) | INTRAVENOUS | Status: AC
  Administered 2014-01-15 – 2014-01-16 (×3): 1000 mg via INTRAVENOUS
  Filled 2014-01-15 (×6): qty 100

## 2014-01-15 MED ORDER — POTASSIUM CHLORIDE 10 MEQ/100ML IV SOLN
10.0000 meq | INTRAVENOUS | Status: AC
  Administered 2014-01-15 (×2): 10 meq via INTRAVENOUS
  Filled 2014-01-15 (×2): qty 100

## 2014-01-15 MED ORDER — FENTANYL CITRATE 0.05 MG/ML IJ SOLN
INTRAMUSCULAR | Status: AC
  Filled 2014-01-15: qty 5

## 2014-01-15 MED ORDER — ARTIFICIAL TEARS OP OINT
TOPICAL_OINTMENT | OPHTHALMIC | Status: AC
  Filled 2014-01-15: qty 3.5

## 2014-01-15 MED ORDER — LACTATED RINGERS IV SOLN
INTRAVENOUS | Status: DC | PRN
  Administered 2014-01-15: 14:00:00 via INTRAVENOUS

## 2014-01-15 MED ORDER — PHENYLEPHRINE HCL 10 MG/ML IJ SOLN
INTRAMUSCULAR | Status: DC | PRN
  Administered 2014-01-15 (×6): 80 ug via INTRAVENOUS

## 2014-01-15 MED ORDER — MORPHINE SULFATE 2 MG/ML IJ SOLN: 2.0000 mg | INTRAMUSCULAR | Status: DC | PRN

## 2014-01-15 MED ORDER — MIDAZOLAM HCL 5 MG/5ML IJ SOLN
INTRAMUSCULAR | Status: DC | PRN
  Administered 2014-01-15: 0.5 mg via INTRAVENOUS

## 2014-01-15 MED ORDER — SUCCINYLCHOLINE CHLORIDE 20 MG/ML IJ SOLN
INTRAMUSCULAR | Status: DC | PRN
  Administered 2014-01-15: 100 mg via INTRAVENOUS

## 2014-01-15 MED ORDER — LACTATED RINGERS IV SOLN
INTRAVENOUS | Status: DC
  Administered 2014-01-15: 14:00:00 via INTRAVENOUS

## 2014-01-15 MED ORDER — DEXTROSE 5 % IV SOLN
2.0000 g | INTRAVENOUS | Status: AC
  Administered 2014-01-15: 2 g via INTRAVENOUS
  Filled 2014-01-15: qty 2

## 2014-01-15 MED ORDER — PROPOFOL 10 MG/ML IV BOLUS
INTRAVENOUS | Status: DC | PRN
  Administered 2014-01-15: 100 mg via INTRAVENOUS

## 2014-01-15 MED ORDER — DEXTROSE 5 % IV SOLN
2.0000 g | Freq: Three times a day (TID) | INTRAVENOUS | Status: DC
  Administered 2014-01-15 – 2014-01-19 (×12): 2 g via INTRAVENOUS
  Filled 2014-01-15 (×15): qty 2

## 2014-01-15 MED ORDER — MIDAZOLAM HCL 2 MG/2ML IJ SOLN
INTRAMUSCULAR | Status: AC
  Filled 2014-01-15: qty 2

## 2014-01-15 MED ORDER — ONDANSETRON HCL 4 MG/2ML IJ SOLN
INTRAMUSCULAR | Status: AC
  Filled 2014-01-15: qty 2

## 2014-01-15 MED ORDER — GLYCOPYRROLATE 0.2 MG/ML IJ SOLN
INTRAMUSCULAR | Status: AC
  Filled 2014-01-15: qty 3

## 2014-01-15 MED ORDER — ONDANSETRON HCL 4 MG/2ML IJ SOLN
INTRAMUSCULAR | Status: DC | PRN
  Administered 2014-01-15: 4 mg via INTRAVENOUS

## 2014-01-15 MED ORDER — PHENYLEPHRINE 40 MCG/ML (10ML) SYRINGE FOR IV PUSH (FOR BLOOD PRESSURE SUPPORT)
PREFILLED_SYRINGE | INTRAVENOUS | Status: AC
  Filled 2014-01-15: qty 20

## 2014-01-15 MED ORDER — METOPROLOL TARTRATE 1 MG/ML IV SOLN
2.5000 mg | Freq: Once | INTRAVENOUS | Status: AC
  Administered 2014-01-15: 2.5 mg via INTRAVENOUS
  Filled 2014-01-15: qty 5

## 2014-01-15 MED ORDER — 0.9 % SODIUM CHLORIDE (POUR BTL) OPTIME
TOPICAL | Status: DC | PRN
  Administered 2014-01-15 (×2): 1000 mL

## 2014-01-15 MED ORDER — DIPHENHYDRAMINE HCL 12.5 MG/5ML PO ELIX
12.5000 mg | ORAL_SOLUTION | Freq: Four times a day (QID) | ORAL | Status: DC | PRN
  Filled 2014-01-15: qty 5

## 2014-01-15 MED ORDER — METOPROLOL TARTRATE 1 MG/ML IV SOLN: 5.0000 mg | Freq: Four times a day (QID) | INTRAVENOUS | Status: DC | PRN

## 2014-01-15 MED ORDER — HYDROMORPHONE 0.3 MG/ML IV SOLN
INTRAVENOUS | Status: DC
  Administered 2014-01-15 – 2014-01-17 (×5): 0.2 mg via INTRAVENOUS

## 2014-01-15 SURGICAL SUPPLY — 54 items
BLADE SURG ROTATE 9660 (MISCELLANEOUS) IMPLANT
CANISTER SUCTION 2500CC (MISCELLANEOUS) ×3 IMPLANT
CHLORAPREP W/TINT 26ML (MISCELLANEOUS) ×3 IMPLANT
COVER MAYO STAND STRL (DRAPES) IMPLANT
COVER SURGICAL LIGHT HANDLE (MISCELLANEOUS) ×3 IMPLANT
DRAPE LAPAROSCOPIC ABDOMINAL (DRAPES) ×3 IMPLANT
DRAPE PROXIMA HALF (DRAPES) IMPLANT
DRAPE UTILITY 15X26 W/TAPE STR (DRAPE) ×6 IMPLANT
DRAPE WARM FLUID 44X44 (DRAPE) ×3 IMPLANT
DRSG COVADERM 4X8 (GAUZE/BANDAGES/DRESSINGS) ×3 IMPLANT
DRSG OPSITE POSTOP 4X10 (GAUZE/BANDAGES/DRESSINGS) IMPLANT
DRSG OPSITE POSTOP 4X8 (GAUZE/BANDAGES/DRESSINGS) IMPLANT
ELECT BLADE 6.5 EXT (BLADE) ×3 IMPLANT
ELECT CAUTERY BLADE 6.4 (BLADE) ×6 IMPLANT
ELECT REM PT RETURN 9FT ADLT (ELECTROSURGICAL) ×3
ELECTRODE REM PT RTRN 9FT ADLT (ELECTROSURGICAL) ×1 IMPLANT
GLOVE BIO SURGEON STRL SZ7 (GLOVE) ×3 IMPLANT
GLOVE BIOGEL PI IND STRL 6.5 (GLOVE) ×2 IMPLANT
GLOVE BIOGEL PI IND STRL 7.0 (GLOVE) ×2 IMPLANT
GLOVE BIOGEL PI IND STRL 7.5 (GLOVE) ×1 IMPLANT
GLOVE BIOGEL PI INDICATOR 6.5 (GLOVE) ×4
GLOVE BIOGEL PI INDICATOR 7.0 (GLOVE) ×4
GLOVE BIOGEL PI INDICATOR 7.5 (GLOVE) ×2
GLOVE SURG SS PI 7.0 STRL IVOR (GLOVE) ×6 IMPLANT
GOWN STRL NON-REIN LRG LVL3 (GOWN DISPOSABLE) ×9 IMPLANT
KIT BASIN OR (CUSTOM PROCEDURE TRAY) ×3 IMPLANT
KIT ROOM TURNOVER OR (KITS) ×3 IMPLANT
LIGASURE IMPACT 36 18CM CVD LR (INSTRUMENTS) ×3 IMPLANT
NS IRRIG 1000ML POUR BTL (IV SOLUTION) ×9 IMPLANT
PACK GENERAL/GYN (CUSTOM PROCEDURE TRAY) ×3 IMPLANT
PAD ARMBOARD 7.5X6 YLW CONV (MISCELLANEOUS) ×3 IMPLANT
PENCIL BUTTON HOLSTER BLD 10FT (ELECTRODE) IMPLANT
RELOAD PROXIMATE 75MM BLUE (ENDOMECHANICALS) ×6 IMPLANT
SPECIMEN JAR LARGE (MISCELLANEOUS) IMPLANT
SPONGE GAUZE 4X4 12PLY (GAUZE/BANDAGES/DRESSINGS) ×3 IMPLANT
SPONGE LAP 18X18 X RAY DECT (DISPOSABLE) IMPLANT
STAPLER GUN LINEAR PROX 60 (STAPLE) ×3 IMPLANT
STAPLER PROXIMATE 75MM BLUE (STAPLE) ×3 IMPLANT
STAPLER VISISTAT 35W (STAPLE) ×3 IMPLANT
SUCTION POOLE TIP (SUCTIONS) ×3 IMPLANT
SUT PDS AB 1 TP1 96 (SUTURE) ×6 IMPLANT
SUT SILK 2 0 SH CR/8 (SUTURE) ×3 IMPLANT
SUT SILK 2 0 TIES 10X30 (SUTURE) ×3 IMPLANT
SUT SILK 3 0 SH CR/8 (SUTURE) ×3 IMPLANT
SUT SILK 3 0 TIES 10X30 (SUTURE) ×3 IMPLANT
SUT VIC AB 2-0 SH 27 (SUTURE) ×2
SUT VIC AB 2-0 SH 27XBRD (SUTURE) ×1 IMPLANT
SUT VIC AB 3-0 SH 18 (SUTURE) IMPLANT
TAPE CLOTH SURG 4X10 WHT LF (GAUZE/BANDAGES/DRESSINGS) ×3 IMPLANT
TOWEL OR 17X26 10 PK STRL BLUE (TOWEL DISPOSABLE) ×3 IMPLANT
TRAY FOLEY CATH 16FRSI W/METER (SET/KITS/TRAYS/PACK) IMPLANT
TUBE CONNECTING 12'X1/4 (SUCTIONS)
TUBE CONNECTING 12X1/4 (SUCTIONS) IMPLANT
YANKAUER SUCT BULB TIP NO VENT (SUCTIONS) ×3 IMPLANT

## 2014-01-15 NOTE — Preoperative (Signed)
Beta Blockers   Reason not to administer Beta Blockers:Not Applicable 

## 2014-01-15 NOTE — Anesthesia Preprocedure Evaluation (Addendum)
Anesthesia Evaluation  Patient identified by MRN, date of birth, ID band Patient awake    Reviewed: Allergy & Precautions, H&P , NPO status , Patient's Chart, lab work & pertinent test results, reviewed documented beta blocker date and time   Airway Mallampati: II TM Distance: >3 FB Neck ROM: Full    Dental  (+) Missing, Dental Advisory Given   Pulmonary Current Smoker,          Cardiovascular hypertension, Pt. on medications     Neuro/Psych    GI/Hepatic   Endo/Other  diabetes, Poorly Controlled, Type 2, Insulin Dependent  Renal/GU      Musculoskeletal   Abdominal   Peds  Hematology   Anesthesia Other Findings   Reproductive/Obstetrics                          Anesthesia Physical Anesthesia Plan  ASA: III  Anesthesia Plan: General   Post-op Pain Management:    Induction: Intravenous and Rapid sequence  Airway Management Planned: Oral ETT  Additional Equipment:   Intra-op Plan:   Post-operative Plan: Extubation in OR  Informed Consent:   Plan Discussed with:   Anesthesia Plan Comments:         Anesthesia Quick Evaluation

## 2014-01-15 NOTE — Anesthesia Procedure Notes (Signed)
Procedure Name: Intubation Date/Time: 01/15/2014 2:46 PM Performed by: Terrill Mohr Pre-anesthesia Checklist: Patient identified, Emergency Drugs available, Suction available and Patient being monitored Patient Re-evaluated:Patient Re-evaluated prior to inductionOxygen Delivery Method: Circle system utilized Preoxygenation: Pre-oxygenation with 100% oxygen Intubation Type: IV induction, Cricoid Pressure applied and Rapid sequence Laryngoscope Size: Mac and 3 Grade View: Grade II Tube size: 7.5 mm Number of attempts: 1 Airway Equipment and Method: Stylet Placement Confirmation: ETT inserted through vocal cords under direct vision,  breath sounds checked- equal and bilateral and positive ETCO2 Secured at: 21 (cm at upper gum) cm Tube secured with: Tape Dental Injury: Teeth and Oropharynx as per pre-operative assessment

## 2014-01-15 NOTE — Consult Note (Signed)
[facility] Medical Consultation  Alicia [name] MRN:[id number] DOB: 02/26/1956 DOA: 01/12/2014 PCP: Angelica Chessman, MD   Requesting physician: Coralie Keens, PA-C Date of consultation: 01/15/14 Reason for consultation: medical management  Impression/Recommendations SBO -Has failed conservative therapy--not the OR today Diabetes mellitus type 2 -10/15/2013 hemoglobin A1c 9.1 -Repeat hemoglobin A1c -Check morning lipid panel -TSH -Continue sensitive NovoLog sliding scale at this time as the patient is n.p.o. Hypertension -Start the patient on metoprolol 2.5 mg IV every 6 hours until the patient is able to tolerate po -However I will await the patient's stabilization after her operation before starting or on any antihypertensive medications while monitoring her blood pressure in the postoperative period. -We'll adjust medications based on the patient's blood pressures Hypokalemia -Replete -Check magnesium Tobacco abuse -The patient has approximately 30-pack-year history -She defers nicotine patch at this time -Tobacco cessation discussed  I will followup again tomorrow. Please contact me if I can be of assistance in the meanwhile. Thank you for this consultation.  Chief Complaint: abdominal pain  HPI:  58 year old female with a history of hypertension, diabetes mellitus, stroke presented with 2-3 day history of increasing abdominal pain associated with nausea and vomiting. The patient had complained of constipation without a bowel movement for one week prior to admission. CT of the abdomen and pelvis at the time of admission showed diffuse dilatation of small bowel loops extending from the distal jejunum to the ileum with transition point in the right upper quadrant. The small bowel loops were dilated up to 4.8 cm. The patient was treated medically with IV fluids and and NG tube suction. Although there was initial improvement, the patient complained of increasing abdominal  pain and distention with increasing NG output in the past 24 hours. As a result, the patient is being taken to surgery for operative management. The patient has not had any previous abdominal surgeries. She denies any history of colonoscopy.  The patient states that she has been on lisinopril for a number of years. She states her blood pressure was controlled in the outpatient setting. In addition, the patient has been on 70/30, 5 units in the morning for a number of years. She endorses compliance with these medications. Since admission to the hospital, the patient's blood pressure has gradually increased. However, the patient has been n.p.o. without her antihypertensive medications. The patient has been on an insulin sliding scale. Her glycemic control has been fair. Internal medicine is now being consulted for medical management. The patient currently denies any fevers, chills, chest discomfort, shortness of breath, coughing, hemoptysis vomiting, diarrhea, dysuria, hematuria. She has not had any vomiting since the day of admission.  Review of Systems:  Constitutional:  No weight loss, night sweats, Fevers, chills, fatigue.  Head&Eyes: No headache.  No vision loss.  No eye pain or scotoma ENT:  No Difficulty swallowing,Tooth/dental problems,Sore throat,  No ear ache, post nasal drip,  Cardio-vascular:  No chest pain, Orthopnea, PND, swelling in lower extremities,  dizziness, palpitations  GI:  No heartburn, indigestiondiarrhea, loss of appetite, hematochezia, melena Resp:  No shortness of breath with exertion or at rest. No excess mucus, no productive cough, No non-productive cough, No coughing up of blood.No change in color of mucus.No wheezing.No chest wall deformity  Skin:  no rash or lesions.  GU:  no dysuria, change in color of urine, no urgency or frequency. No flank pain.  Musculoskeletal:  No joint pain or swelling. No decreased range of motion. No back pain.  Psych:  No change in  mood or affect. No depression or anxiety. Neurologic: No headache, no dysesthesia, no focal weakness, no vision loss. No syncope   Past Medical History  Diagnosis Date  . Diabetes mellitus, type II   . Hypertension   . H/O: CVA (cerebrovascular accident)     6 strokes, most recent on 05/10/13  . CHF (congestive heart failure)    Past Surgical History  Procedure Laterality Date  . Tubal ligation    . Cardiac catheterization     Social History:  reports that she has been smoking Cigarettes.  She has a 12.5 pack-year smoking history. She has never used smokeless tobacco. She reports that she does not drink alcohol or use illicit drugs.  Family History  Problem Relation Age of Onset  . Diabetes Mellitus II Mother   . Hypertension Mother   . CVA Mother     Allergies  Allergen Reactions  . Asa [Aspirin] Other (See Comments)    Told not to take med-per MD  . Other Other (See Comments)    Told not to take any dairy products-per MD     Prior to Admission medications   Medication Sig Start Date End Date Taking? Authorizing Provider  bisacodyl (DULCOLAX) 5 MG EC tablet Take 10 mg by mouth daily as needed for moderate constipation.   Yes Historical Provider, MD  insulin NPH-regular Human (NOVOLIN 70/30) (70-30) 100 UNIT/ML injection Inject 5 Units into the skin daily with breakfast.   Yes Historical Provider, MD  lisinopril (PRINIVIL,ZESTRIL) 20 MG tablet Take 0.5 tablets (10 mg total) by mouth daily. 11/01/13  Yes Reyne Dumas, MD    Physical Exam: Filed Vitals:   01/15/14 0012 01/15/14 0414 01/15/14 0741 01/15/14 1300  BP: 179/94 186/101 186/89 176/103  Pulse: 103 107 95 100  Temp: 98.4 F (36.9 C) 98.3 F (36.8 C) 97.8 F (36.6 C) 98.3 F (36.8 C)  TempSrc: Oral Oral Oral Oral  Resp: 18 16 18 20   Height:      Weight:      SpO2: 96% 97% 100% 98%   General:  A&O x 3, NAD, nontoxic, pleasant/cooperative Head/Eye: No conjunctival hemorrhage, no icterus, Spur/AT, No  nystagmus ENT:  No icterus,  No thrush, no pharyngeal exudate Neck:  No masses, no lymphadenpathy,  CV:  RRR, no rub, no gallop, no S3 Lung:  Diminished breath sounds but clear to auscultation Abdomen: soft/ +BS, mildly distended with diffuse abdominal tenderness. Ext: No cyanosis, No rashes, No petechiae, No lymphangitis, No edema Neuro: CNII-XII intact, strength 4/5 in bilateral upper and lower extremities, no dysmetria  Labs on Admission:  Basic Metabolic Panel:  Recent Labs Lab 01/12/14 1954 01/13/14 0405 01/14/14 0917 01/15/14 0701  NA 135* 138 143 135*  K 4.2 4.2 3.6* 3.3*  CL 81* 88* 96 95*  CO2 37* 36* 29 26  GLUCOSE 250* 170* 83 211*  BUN 34* 42* 24* 11  CREATININE 1.31* 1.30* 0.52 0.37*  CALCIUM 11.2* 9.7 9.2 9.0   Liver Function Tests:  Recent Labs Lab 01/12/14 1954  AST 19  ALT 15  ALKPHOS 75  BILITOT 0.5  PROT 8.0  ALBUMIN 3.7    Recent Labs Lab 01/12/14 1954  LIPASE 10*   No results found for this basename: AMMONIA,  in the last 168 hours CBC:  Recent Labs Lab 01/12/14 1954 01/13/14 0405 01/14/14 0917 01/15/14 0701  WBC 9.4 5.6 8.4 7.7  NEUTROABS 7.8*  --  6.3  --   HGB 14.6  13.1 13.0 12.9  HCT 41.7 38.3 39.0 38.3  MCV 80.0 80.0 83.0 81.8  PLT 382 337 347 309   Cardiac Enzymes: No results found for this basename: CKTOTAL, CKMB, CKMBINDEX, TROPONINI,  in the last 168 hours BNP: No components found with this basename: POCBNP,  CBG:  Recent Labs Lab 01/14/14 1641 01/14/14 1958 01/15/14 0740 01/15/14 1234 01/15/14 1325  GLUCAP 132* 142* 197* 186* 190*    Radiological Exams on Admission: Dg Abd 2 Views  01/14/2014   CLINICAL DATA:  Small bowel obstruction.  EXAM: ABDOMEN - 2 VIEW  COMPARISON:  01/13/2014  FINDINGS: A nasogastric tube remains in place with tip in the distal stomach. Several dilated small bowel loops are again seen in the lower abdomen which are nearly completely fluid-filled. Colon remains nondilated. These  findings remain consistent with a small bowel obstruction.  IMPRESSION: Small bowel obstruction, with nearly completely fluid-filled loops of dilated small bowel.   Electronically Signed   By: Earle Gell M.D.   On: 01/14/2014 13:00     Time spent: 60 min  Cassell Voorhies [facility] Pager [phone]  If 7PM-7AM, please contact night-coverage [email or web address] Password [facility] 01/15/2014, 3:17 PM

## 2014-01-15 NOTE — Op Note (Signed)
Pre-Op diagnosis - Small bowel obstruction Post-op Diagnosis - Closed loop small bowel obstruction with strangulated small bowel Procedure:  Exploratory laparotomy/ lysis of adhesions/ small bowel resection with primary anastomosis Surgeon:  Thayer Embleton K. Assistant - Jinny Blossom Dort PA-C Anesthesia: Gen. Endotracheal Indications: This is a 58 year old female with a past surgical history significant only for tubal ligation but with medical comorbidities including hypertension and type 2 diabetes that presented with signs of bowel obstruction. Initially she was quite distended but improved with a nasogastric tube. She had oral contrast in her colon on hospital day #2. However she seemed more distended and much more tender on her right side today. She is mildly tachycardic. We recommended immediate exploration for small bowel obstruction.  Description of procedure: The patient brought to the operating room placed in supine position on the operating room table. After an adequate level of general anesthesia was obtained, her abdomen was prepped with chlor prep and draped in sterile fashion. A Foley catheter had been placed by nursing. A timeout was taken to ensure the proper patient proper procedure. We made a vertical midline incision above the umbilicus. The patient has a small umbilical hernia. We dissected down to the linea alba which was opened vertically. We into the peritoneal cavity sharply. The patient has a moderate amount of ascites. There is no gross succus entericus or purulence noted. We immediately identified a segment of small bowel in the right upper quadrant just under the liver that was obviously ischemic. We open her incision wider and I was able identify a wide band of adhesions from the anterior surface of the liver to the anterior abdominal wall. There was a small defect between this band of adhesions and the falciform ligament. A large segment of small bowel had herniated up through this  defect over the dome of the liver. This had resulted in a closed loop obstruction. The pain was divided with cautery. We reduced the small bowel out of the hernia over the liver. There is a 15 cm segment of small bowel that was obviously ischemic.  We resected this with a GIA stapler and the LigaSure device. We created a side-to-side anastomosis with GIA stapler and a TA 60 stapler. A reinforcing crotch suture of 3-0 silk was placed. The staple line was oversewn and some bleeding points with 3-0 silk. 2-0 silk was used to close the mesenteric defect. We then thoroughly irrigated the abdomen. We inspected for hemostasis. The nasogastric tube was palpated within stomach. The liver otherwise appeared normal.    The fascia was reapproximated with double-stranded #1 PDS suture. The base of the umbilicus was tacked down with 2-0 Vicryl. We excised some the hernia sac. Staples were then used to close the skin. An occlusive dressing was applied. The patient was then extubated and brought to recovery in stable condition. All sponge, initially, and needle counts are correct.  Imogene Burn. Georgette Dover, MD, [facility] Surgery  General/ Trauma Surgery  01/15/2014 3:56 PM

## 2014-01-15 NOTE — Progress Notes (Signed)
Large NG output Increasing abdominal distention and tenderness  Recommend exploratory laparotomy/ lysis of adhesions/ possible small bowel resection.  The surgical procedure has been discussed with the patient.  Potential risks, benefits, alternative treatments, and expected outcomes have been explained.  All of the patient's questions at this time have been answered.  The likelihood of reaching the patient's treatment goal is good.  The patient understand the proposed surgical procedure and wishes to proceed. Imogene Burn. Georgette Dover, MD, [facility] Surgery  General/ Trauma Surgery  01/15/2014 1:00 PM

## 2014-01-15 NOTE — Anesthesia Postprocedure Evaluation (Signed)
  Anesthesia Post-op Note  Patient: Alicia [name]  Procedure(s) Performed: Procedure(s): EXPLORATORY LAPAROTOMY SMALL BOWEL RESECTION (N/A) LYSIS OF ADHESION (N/A)  Patient Location: PACU  Anesthesia Type:General  Level of Consciousness: awake  Airway and Oxygen Therapy: Patient Spontanous Breathing  Post-op Pain: mild  Post-op Assessment: Post-op Vital signs reviewed, Patient's Cardiovascular Status Stable, Respiratory Function Stable, Patent Airway, No signs of Nausea or vomiting and Pain level controlled  Post-op Vital Signs: Reviewed and stable  Complications: No apparent anesthesia complications

## 2014-01-15 NOTE — Transfer of Care (Signed)
Immediate Anesthesia Transfer of Care Note  Patient: Alicia [name]  Procedure(s) Performed: Procedure(s): EXPLORATORY LAPAROTOMY SMALL BOWEL RESECTION (N/A) LYSIS OF ADHESION (N/A)  Patient Location: PACU  Anesthesia Type:General  Level of Consciousness: awake, alert , oriented and patient cooperative  Airway & Oxygen Therapy: Patient Spontanous Breathing and Patient connected to nasal cannula oxygen  Post-op Assessment: Report given to PACU RN, Post -op Vital signs reviewed and stable and Patient moving all extremities  Post vital signs: Reviewed and stable  Complications: No apparent anesthesia complications

## 2014-01-15 NOTE — Progress Notes (Signed)
Subjective: Pt is miserable.  C/o a lot of pain.  Less nausea, no vomiting.  NG put out 2,015mL/ 24 hours.  IV morphine not helping with the pain.  Ambulating through the hall and had one single flatus.  Reports NG tube with blood.  Pt feels very weak.  Daughter at bedside.   Objective: Vital signs in last 24 hours: Temp:  [97.8 F (36.6 C)-98.4 F (36.9 C)] 97.8 F (36.6 C) (02/23 0741) Pulse Rate:  [95-111] 95 (02/23 0741) Resp:  [16-20] 18 (02/23 0741) BP: (164-189)/(85-113) 186/89 mmHg (02/23 0741) SpO2:  [96 %-100 %] 100 % (02/23 0741) Last BM Date: January 22, 2014  Intake/Output from previous day: January 22, 2023 0701 - 02/23 0700 In: 1611.7 [I.V.:1611.7] Out: 2000 [Emesis/NG output:2000] Intake/Output this shift: Total I/O In: 60 [P.O.:60] Out: -   PE: Gen:  Alert, NAD, pleasant Abd: Soft, exquisitely tender to palpation in the RLQ, LUQ, and periumbilical, early peritoneal signs noted, pain with percussion, diminished BS, umbilical hernia is soft but pain was too severe couldn't fully reduce, barely visible periumbilical scar from tubal ligation surgery   Lab Results:   Recent Labs  01-22-2014 0917 01/15/14 0701  WBC 8.4 7.7  HGB 13.0 12.9  HCT 39.0 38.3  PLT 347 309   BMET  Recent Labs  01-22-14 0917 01/15/14 0701  NA 143 135*  K 3.6* 3.3*  CL 96 95*  CO2 29 26  GLUCOSE 83 211*  BUN 24* 11  CREATININE 0.52 0.37*  CALCIUM 9.2 9.0   PT/INR No results found for this basename: LABPROT, INR,  in the last 72 hours CMP     Component Value Date/Time   NA 135* 01/15/2014 0701   K 3.3* 01/15/2014 0701   CL 95* 01/15/2014 0701   CO2 26 01/15/2014 0701   GLUCOSE 211* 01/15/2014 0701   BUN 11 01/15/2014 0701   CREATININE 0.37* 01/15/2014 0701   CREATININE 0.46* 11/01/2013 1238   CALCIUM 9.0 01/15/2014 0701   PROT 8.0 01/12/2014 1954   ALBUMIN 3.7 01/12/2014 1954   AST 19 01/12/2014 1954   ALT 15 01/12/2014 1954   ALKPHOS 75 01/12/2014 1954   BILITOT 0.5 01/12/2014 1954   GFRNONAA >90 01/15/2014 0701   GFRAA >90 01/15/2014 0701   Lipase     Component Value Date/Time   LIPASE 10* 01/12/2014 1954       Studies/Results: Dg Abd 2 Views  01/22/14   CLINICAL DATA:  Small bowel obstruction.  EXAM: ABDOMEN - 2 VIEW  COMPARISON:  01/13/2014  FINDINGS: A nasogastric tube remains in place with tip in the distal stomach. Several dilated small bowel loops are again seen in the lower abdomen which are nearly completely fluid-filled. Colon remains nondilated. These findings remain consistent with a small bowel obstruction.  IMPRESSION: Small bowel obstruction, with nearly completely fluid-filled loops of dilated small bowel.   Electronically Signed   By: Earle Gell M.D.   On: 01/22/14 13:00    Anti-infectives: Anti-infectives   Start     Dose/Rate Route Frequency Ordered Stop   01/12/14 2115  cefTRIAXone (ROCEPHIN) 1 g in dextrose 5 % 50 mL IVPB     1 g 100 mL/hr over 30 Minutes Intravenous  Once 01/12/14 2110 01/12/14 2200       Assessment/Plan pSBO Hypokalemia  DM type II HTN H/o CVA H/o CHF  Plan: 1.  Day 3 of conservative management (NPO, NG tube, IVF, pain control, antiemetics).  She is not significantly improved, and is likely  worse overall.  She may very well need surgical intervention today or tomorrow. 2.  Ambulate and IS 3.  SCD's and lovenox (on hold) 4.  Hydralazine on backorder switch to metoprolol prn, may need to make schedule if no improvement 5.  Up Morphine dose to 4mg  q2h, add robaxin and IV tylenol 6.  Supplement K 7.  Dr.Tsuei to see, hold lovenox until decision for/against surgery is made, ordered STAT KUB 8.  Will call medicine consult given hyperglycemia and hypertension and may need pre-op clearence    LOS: 3 days    DORT, Sadrac Zeoli 01/15/2014, 11:00 AM Pager: [phone]

## 2014-01-16 ENCOUNTER — Encounter (HOSPITAL_COMMUNITY): Payer: Self-pay | Admitting: Surgery

## 2014-01-16 DIAGNOSIS — I10 Essential (primary) hypertension: Secondary | ICD-10-CM

## 2014-01-16 DIAGNOSIS — E119 Type 2 diabetes mellitus without complications: Secondary | ICD-10-CM

## 2014-01-16 LAB — GLUCOSE, CAPILLARY
GLUCOSE-CAPILLARY: 104 mg/dL — AB (ref 70–99)
GLUCOSE-CAPILLARY: 88 mg/dL (ref 70–99)
Glucose-Capillary: 96 mg/dL (ref 70–99)
Glucose-Capillary: 70 mg/dL (ref 70–99)
Glucose-Capillary: 69 mg/dL — ABNORMAL LOW (ref 70–99)
Glucose-Capillary: 73 mg/dL (ref 70–99)

## 2014-01-16 LAB — MAGNESIUM: MAGNESIUM: 1.5 mg/dL (ref 1.5–2.5)

## 2014-01-16 MED ORDER — INSULIN ASPART 100 UNIT/ML ~~LOC~~ SOLN: 0.0000 [IU] | Freq: Three times a day (TID) | SUBCUTANEOUS | Status: DC

## 2014-01-16 MED ORDER — METOPROLOL TARTRATE 1 MG/ML IV SOLN
2.5000 mg | Freq: Once | INTRAVENOUS | Status: AC
  Administered 2014-01-16: 2.5 mg via INTRAVENOUS
  Filled 2014-01-16: qty 5

## 2014-01-16 MED ORDER — METOPROLOL TARTRATE 1 MG/ML IV SOLN
2.0000 mg | Freq: Four times a day (QID) | INTRAVENOUS | Status: DC
  Administered 2014-01-16 – 2014-01-17 (×5): 2 mg via INTRAVENOUS
  Filled 2014-01-16 (×11): qty 5

## 2014-01-16 MED ORDER — POTASSIUM CHLORIDE 2 MEQ/ML IV SOLN
INTRAVENOUS | Status: DC
  Filled 2014-01-16 (×2): qty 1000

## 2014-01-16 MED ORDER — KCL IN DEXTROSE-NACL 20-5-0.45 MEQ/L-%-% IV SOLN
INTRAVENOUS | Status: DC
  Administered 2014-01-16 – 2014-01-17 (×2): via INTRAVENOUS
  Filled 2014-01-16 (×3): qty 1000

## 2014-01-16 MED ORDER — MUPIROCIN 2 % EX OINT
1.0000 "application " | TOPICAL_OINTMENT | Freq: Two times a day (BID) | CUTANEOUS | Status: DC
  Administered 2014-01-16 – 2014-01-19 (×7): 1 via NASAL
  Filled 2014-01-16 (×2): qty 22

## 2014-01-16 MED ORDER — INSULIN ASPART 100 UNIT/ML ~~LOC~~ SOLN
0.0000 [IU] | SUBCUTANEOUS | Status: DC
  Administered 2014-01-17 (×3): 1 [IU] via SUBCUTANEOUS
  Administered 2014-01-17 – 2014-01-18 (×2): 2 [IU] via SUBCUTANEOUS
  Administered 2014-01-18: 1 [IU] via SUBCUTANEOUS
  Administered 2014-01-19: 3 [IU] via SUBCUTANEOUS
  Administered 2014-01-19: 1 [IU] via SUBCUTANEOUS
  Administered 2014-01-19 (×2): 2 [IU] via SUBCUTANEOUS
  Administered 2014-01-20: 1 [IU] via SUBCUTANEOUS
  Administered 2014-01-20: 2 [IU] via SUBCUTANEOUS
  Administered 2014-01-20: 1 [IU] via SUBCUTANEOUS

## 2014-01-16 MED ORDER — CHLORHEXIDINE GLUCONATE CLOTH 2 % EX PADS
6.0000 | MEDICATED_PAD | Freq: Every day | CUTANEOUS | Status: AC
  Administered 2014-01-16 – 2014-01-20 (×5): 6 via TOPICAL

## 2014-01-16 NOTE — Progress Notes (Signed)
Pt transferred from PACU around 2200, admitted to Rm/3S15. She is alert and oriented, minimal complaints of pain. Midline abdominal incision with gauze, dry and intact. Placed on telemetry, currently ST. SBP in the 180s, MD notified, orders received for Metoprolol. Oriented to room, instructed to call for assistance before getting out of bed. Resting comfortably at this time, will continue to monitor

## 2014-01-16 NOTE — Progress Notes (Signed)
Dr. Redmond Pulling notified of CBG 69.

## 2014-01-16 NOTE — Progress Notes (Signed)
1 Day Post-Op  Subjective: Patient feeling much better - sore, but using minimal PCA No flatus yet   Objective: Vital signs in last 24 hours: Temp:  [97.8 F (36.6 C)-98.6 F (37 C)] 98.6 F (37 C) (02/24 0318) Pulse Rate:  [51-101] 71 (02/24 0738) Resp:  [15-22] 20 (02/24 0800) BP: (134-186)/(72-103) 158/75 mmHg (02/24 0738) SpO2:  [98 %-100 %] 99 % (02/24 0800) Weight:  [136 lb 7.4 oz (61.9 kg)] 136 lb 7.4 oz (61.9 kg) (02/23 2045) Last BM Date: 01/14/14  Intake/Output from previous day: 02/23 0701 - 02/24 0700 In: 2710 [P.O.:60; I.V.:1850; IV Piggyback:800] Out: 865 [Urine:815; Blood:50] Intake/Output this shift:    General appearance: alert, cooperative and no distress Resp: clear to auscultation bilaterally Cardio: regular rate and rhythm, S1, S2 normal, no murmur, click, rub or gallop GI: soft, incisional tenderness; non-distended Incision/Wound:  Dressing dry  Lab Results:   Recent Labs  01/14/14 0917 01/15/14 0701  WBC 8.4 7.7  HGB 13.0 12.9  HCT 39.0 38.3  PLT 347 309   BMET  Recent Labs  01/14/14 0917 01/15/14 0701  NA 143 135*  K 3.6* 3.3*  CL 96 95*  CO2 29 26  GLUCOSE 83 211*  BUN 24* 11  CREATININE 0.52 0.37*  CALCIUM 9.2 9.0   PT/INR No results found for this basename: LABPROT, INR,  in the last 72 hours ABG No results found for this basename: PHART, PCO2, PO2, HCO3,  in the last 72 hours  Studies/Results: No results found.  Anti-infectives: Anti-infectives   Start     Dose/Rate Route Frequency Ordered Stop   01/15/14 1515  cefOXitin (MEFOXIN) 2 g in dextrose 5 % 50 mL IVPB     2 g 100 mL/hr over 30 Minutes Intravenous To Surgery 01/15/14 1503 01/15/14 1814   01/15/14 1400  cefOXitin (MEFOXIN) 2 g in dextrose 5 % 50 mL IVPB     2 g 100 mL/hr over 30 Minutes Intravenous 3 times per day 01/15/14 1257     01/12/14 2115  cefTRIAXone (ROCEPHIN) 1 g in dextrose 5 % 50 mL IVPB     1 g 100 mL/hr over 30 Minutes Intravenous  Once  01/12/14 2110 01/12/14 2200      Assessment/Plan: s/p Procedure(s): EXPLORATORY LAPAROTOMY SMALL BOWEL RESECTION (N/A) LYSIS OF ADHESION (N/A) Transfer to floor Ambulate with assistance Awaiting return of bowel function Appreciate TRH assistance with DM/ HTN  LOS: 4 days    Alicia Aure K. 01/16/2014

## 2014-01-16 NOTE — Progress Notes (Signed)
First recording in error

## 2014-01-16 NOTE — Progress Notes (Signed)
Pt arrived to 6n18 at this time, denies nausea/pain, oriented to room and surroundings

## 2014-01-16 NOTE — Progress Notes (Signed)
1510: Attempted report; Patty, RN unable to take report. Will continue to monitor patient.  1530: Attempted to call report to [place], Therapist, sports. Unable to take report. Will continue to monitor. Patty will call back in 5 minutes.  1540: Patty, RN called. Report given.   1641: All belongings sent with patient. Daughter, Solmon Ice called with room change. NG clamped for transport. IV continued with PCA. VSS. eICU and CCMT notified of transfer. Transferred via wheelchair by NT, transfer delayed due to transport.

## 2014-01-16 NOTE — Progress Notes (Signed)
Utilization review completed.  

## 2014-01-16 NOTE — Progress Notes (Signed)
[facility] Progress Note [place] TEAM 1 - Stepdown/ICU TEAM   [facility] May Shadduck GYI:[id number] DOB: [birth year] DOA: 01/12/2014 PCP: Angelica Chessman, MD  Brief narrative: Alicia [name] is a 58 y.o. female presenting on 01/12/2014 with  has a past medical history of Diabetes mellitus, type II; Hypertension; H/O: CVA (cerebrovascular accident), smoker and CHF (congestive heart failure) who presents with SBO. We are consulted for medical management.   Subjective: Sitting up in the chair. Feels well. Sugar at 70 but no symptoms of hypoglycemia.   Assessment/Plan: Principal Problem:   SBO (small bowel obstruction) - per surgery  Active Problems:   Type II or unspecified type diabetes mellitus without mention of complication, uncontrolled - will cut back to sensitive sliding scale    Unspecified essential hypertension - controlled on IV Lopressor    Tobacco use disorder - counseled to stop smoking    Code Status: Full code Family Communication: none Disposition Plan: per surgery  DVT prophylaxis: Lovenox  Objective: Filed Weights   01/12/14 1957 01/13/14 0119 01/15/14 2045  Weight: 58.514 kg (129 lb) 58.1 kg (128 lb 1.4 oz) 61.9 kg (136 lb 7.4 oz)   Blood pressure 176/84, pulse 89, temperature 98.4 F (36.9 C), temperature source Oral, resp. rate 18, height 5\' 6"  (1.676 m), weight 61.9 kg (136 lb 7.4 oz), SpO2 100.00%.  Intake/Output Summary (Last 24 hours) at 01/16/14 1636 Last data filed at 01/16/14 1311  Gross per 24 hour  Intake 1768.33 ml  Output    775 ml  Net 993.33 ml     Exam: General: No acute respiratory distress Lungs: Clear to auscultation bilaterally without wheezes or crackles Cardiovascular: Regular rate and rhythm without murmur gallop or rub normal S1 and S2 Abdomen: nondistended, soft, NG tube to drainage, no rebound, no ascites, no appreciable mass Extremities: No significant cyanosis, clubbing, or edema bilateral lower  extremities  Data Reviewed: Basic Metabolic Panel:  Recent Labs Lab 01/12/14 1954 01/13/14 0405 01/14/14 0917 01/15/14 0701 01/16/14 1303  NA 135* 138 143 135*  --   K 4.2 4.2 3.6* 3.3*  --   CL 81* 88* 96 95*  --   CO2 37* 36* 29 26  --   GLUCOSE 250* 170* 83 211*  --   BUN 34* 42* 24* 11  --   CREATININE 1.31* 1.30* 0.52 0.37*  --   CALCIUM 11.2* 9.7 9.2 9.0  --   MG  --   --   --   --  1.5   Liver Function Tests:  Recent Labs Lab 01/12/14 1954  AST 19  ALT 15  ALKPHOS 75  BILITOT 0.5  PROT 8.0  ALBUMIN 3.7    Recent Labs Lab 01/12/14 1954  LIPASE 10*   No results found for this basename: AMMONIA,  in the last 168 hours CBC:  Recent Labs Lab 01/12/14 1954 01/13/14 0405 01/14/14 0917 01/15/14 0701  WBC 9.4 5.6 8.4 7.7  NEUTROABS 7.8*  --  6.3  --   HGB 14.6 13.1 13.0 12.9  HCT 41.7 38.3 39.0 38.3  MCV 80.0 80.0 83.0 81.8  PLT 382 337 347 309   Cardiac Enzymes: No results found for this basename: CKTOTAL, CKMB, CKMBINDEX, TROPONINI,  in the last 168 hours BNP (last 3 results) No results found for this basename: PROBNP,  in the last 8760 hours CBG:  Recent Labs Lab 01/15/14 1325 01/15/14 1627 01/15/14 2154 01/16/14 0739 01/16/14 1154  GLUCAP 190* 143* 117* 96 70  Recent Results (from the past 240 hour(s))  SURGICAL PCR SCREEN     Status: Abnormal   Collection Time    01/15/14  1:51 PM      Result Value Ref Range Status   MRSA, PCR POSITIVE (*) NEGATIVE Final   Staphylococcus aureus POSITIVE (*) NEGATIVE Final   Comment:            The Xpert SA Assay (FDA     approved for NASAL specimens     in patients over 73 years of age),     is one component of     a comprehensive surveillance     program.  Test performance has     been validated by [employer] for patients greater     than or equal to 22 year old.     It is not intended     to diagnose infection nor to     guide or monitor treatment.     Studies:  Recent x-ray  studies have been reviewed in detail by the Attending Physician  Scheduled Meds:  Scheduled Meds: . antiseptic oral rinse  15 mL Mouth Rinse BID  . cefOXitin  2 g Intravenous 3 times per day  . Chlorhexidine Gluconate Cloth  6 each Topical Q0600  . enoxaparin (LOVENOX) injection  40 mg Subcutaneous Daily  . HYDROmorphone PCA 0.3 mg/mL   Intravenous 6 times per day  . insulin aspart  0-9 Units Subcutaneous TID WC  . metoprolol  2 mg Intravenous 4 times per day  . mupirocin ointment  1 application Nasal BID  . pantoprazole (PROTONIX) IV  40 mg Intravenous QHS   Continuous Infusions: . 0.9 % NaCl with KCl 20 mEq / L 100 mL/hr at 01/16/14 1311  . lactated ringers Stopped (01/16/14 0030)    Time spent on care of this patient: 37 min   Debbe Odea, MD  [facility] Office  [phone] Pager - Text Page per Amion as per below:  On-Call/Text Page:      [email or web address]      password [email or web address]  If 7PM-7AM, please contact night-coverage [email or web address] Password [facility] 01/16/2014, 4:36 PM   LOS: 4 days

## 2014-01-16 NOTE — Progress Notes (Signed)
Orthopedic Tech Progress Note Patient Details:  Alicia [name] 29-Jul-1956 [id number]  Ortho Devices Type of Ortho Device: Abdominal binder Ortho Device/Splint Location: abdomen Ortho Device/Splint Interventions: Ordered Abdominal binder left in room with pt ; rn notified  Hildred Priest 01/16/2014, 9:16 PM

## 2014-01-17 DIAGNOSIS — E876 Hypokalemia: Secondary | ICD-10-CM

## 2014-01-17 DIAGNOSIS — N390 Urinary tract infection, site not specified: Secondary | ICD-10-CM

## 2014-01-17 LAB — BASIC METABOLIC PANEL
BUN: 10 mg/dL (ref 6–23)
CALCIUM: 8.4 mg/dL (ref 8.4–10.5)
CO2: 25 mEq/L (ref 19–32)
CREATININE: 0.41 mg/dL — AB (ref 0.50–1.10)
Chloride: 98 mEq/L (ref 96–112)
GFR calc Af Amer: >90 mL/min (ref >90)
GLUCOSE: 129 mg/dL — AB (ref 70–99)
POTASSIUM: 3.5 meq/L — AB (ref 3.7–5.3)
Sodium: 136 mEq/L — ABNORMAL LOW (ref 137–147)

## 2014-01-17 LAB — GLUCOSE, CAPILLARY
GLUCOSE-CAPILLARY: 143 mg/dL — AB (ref 70–99)
Glucose-Capillary: 118 mg/dL — ABNORMAL HIGH (ref 70–99)
Glucose-Capillary: 141 mg/dL — ABNORMAL HIGH (ref 70–99)
Glucose-Capillary: 177 mg/dL — ABNORMAL HIGH (ref 70–99)
Glucose-Capillary: 144 mg/dL — ABNORMAL HIGH (ref 70–99)

## 2014-01-17 MED ORDER — HYDRALAZINE HCL 20 MG/ML IJ SOLN: 5.0000 mg | Freq: Four times a day (QID) | INTRAMUSCULAR | Status: DC | PRN

## 2014-01-17 MED ORDER — METOPROLOL TARTRATE 1 MG/ML IV SOLN
5.0000 mg | Freq: Four times a day (QID) | INTRAVENOUS | Status: DC
  Administered 2014-01-17 (×2): 5 mg via INTRAVENOUS

## 2014-01-17 MED ORDER — HYDROMORPHONE HCL PF 1 MG/ML IJ SOLN: 0.5000 mg | INTRAMUSCULAR | Status: DC | PRN

## 2014-01-17 MED ORDER — METOPROLOL TARTRATE 1 MG/ML IV SOLN
7.5000 mg | Freq: Four times a day (QID) | INTRAVENOUS | Status: DC
  Administered 2014-01-17 – 2014-01-18 (×3): 7.5 mg via INTRAVENOUS
  Filled 2014-01-17 (×6): qty 10

## 2014-01-17 MED ORDER — MAGNESIUM SULFATE 40 MG/ML IJ SOLN
2.0000 g | Freq: Once | INTRAMUSCULAR | Status: AC
  Administered 2014-01-17: 2 g via INTRAVENOUS
  Filled 2014-01-17: qty 50

## 2014-01-17 MED ORDER — HYDRALAZINE HCL 20 MG/ML IJ SOLN: 10.0000 mg | Freq: Four times a day (QID) | INTRAMUSCULAR | Status: DC | PRN

## 2014-01-17 MED ORDER — POTASSIUM CHLORIDE 10 MEQ/100ML IV SOLN
10.0000 meq | INTRAVENOUS | Status: AC
  Administered 2014-01-17 (×4): 10 meq via INTRAVENOUS
  Filled 2014-01-17: qty 100

## 2014-01-17 MED ORDER — KCL IN DEXTROSE-NACL 40-5-0.9 MEQ/L-%-% IV SOLN
INTRAVENOUS | Status: DC
  Administered 2014-01-17: 10:00:00 via INTRAVENOUS
  Filled 2014-01-17 (×3): qty 1000

## 2014-01-17 MED ORDER — METOPROLOL TARTRATE 1 MG/ML IV SOLN: 5.0000 mg | Freq: Three times a day (TID) | INTRAVENOUS | Status: DC

## 2014-01-17 MED ORDER — ENALAPRILAT 1.25 MG/ML IV SOLN
0.6250 mg | Freq: Three times a day (TID) | INTRAVENOUS | Status: DC
  Administered 2014-01-17 – 2014-01-18 (×2): 0.625 mg via INTRAVENOUS
  Filled 2014-01-17 (×6): qty 0.5

## 2014-01-17 NOTE — Progress Notes (Signed)
[facility] Consult/PROGRESS NOTE  Alicia [name] MRN:[id number] DOB: 1956-10-20 DOA: 01/12/2014 PCP: Angelica Chessman, MD  Assessment/Plan: #1 small bowel obstruction Patient with some clinical improvement. No nausea, no vomiting, no abdominal pain. No bowel movement yet. Keep potassium greater than 4. Keep magnesium greater than 2. Per primary team.  #2 hypertension Increase metoprolol to 7.5 mg IV every 6 hours.follow.  #3 UTI On IV cefoxitin.  #4 hypokalemia Replete.  #5 type 2 diabetes Hemoglobin A1c was 9.1 in November of 2014.CBGs have ranged from 88-143. Sliding scale insulin.   Code Status: full Family Communication: updated patient at bedside. Disposition Plan: per primary team.   Consultants:  [facility]:Dr. Tat 01/15/2049  Procedures:  Abdominal x-ray 01/14/2014, 01/13/2014  CT abdomen and pelvis 01/12/2014      Antibiotics:  IV cefoxitin 01/15/2014  IV Rocephin 01/12/2014 to 01/12/2014  HPI/Subjective: Patient denies any nausea, no vomiting, no abdominal pain. Patient states she is waiting to have a bowel movement.  Objective: Filed Vitals:   01/17/14 1214  BP: 179/83  Pulse: 80  Temp:   Resp:     Intake/Output Summary (Last 24 hours) at 01/17/14 1219 Last data filed at 01/17/14 0612  Gross per 24 hour  Intake   1555 ml  Output    925 ml  Net    630 ml   Filed Weights   01/12/14 1957 01/13/14 0119 01/15/14 2045  Weight: 58.514 kg (129 lb) 58.1 kg (128 lb 1.4 oz) 61.9 kg (136 lb 7.4 oz)    Exam:   General:  nad  Cardiovascular: rrr  Respiratory: ctab  Abdomen: soft, nontender, nondistended, hypoactive bowel sounds  Musculoskeletal: no clubbing cyanosis or edema  Data Reviewed: Basic Metabolic Panel:  Recent Labs Lab 01/12/14 1954 01/13/14 0405 01/14/14 0917 01/15/14 0701 01/16/14 1303 01/17/14 0344  NA 135* 138 143 135*  --  136*  K 4.2 4.2 3.6* 3.3*  --  3.5*  CL 81* 88* 96 95*  --  98  CO2  37* 36* 29 26  --  25  GLUCOSE 250* 170* 83 211*  --  129*  BUN 34* 42* 24* 11  --  10  CREATININE 1.31* 1.30* 0.52 0.37*  --  0.41*  CALCIUM 11.2* 9.7 9.2 9.0  --  8.4  MG  --   --   --   --  1.5  --    Liver Function Tests:  Recent Labs Lab 01/12/14 1954  AST 19  ALT 15  ALKPHOS 75  BILITOT 0.5  PROT 8.0  ALBUMIN 3.7    Recent Labs Lab 01/12/14 1954  LIPASE 10*   No results found for this basename: AMMONIA,  in the last 168 hours CBC:  Recent Labs Lab 01/12/14 1954 01/13/14 0405 01/14/14 0917 01/15/14 0701  WBC 9.4 5.6 8.4 7.7  NEUTROABS 7.8*  --  6.3  --   HGB 14.6 13.1 13.0 12.9  HCT 41.7 38.3 39.0 38.3  MCV 80.0 80.0 83.0 81.8  PLT 382 337 347 309   Cardiac Enzymes: No results found for this basename: CKTOTAL, CKMB, CKMBINDEX, TROPONINI,  in the last 168 hours BNP (last 3 results) No results found for this basename: PROBNP,  in the last 8760 hours CBG:  Recent Labs Lab 01/16/14 1822 01/16/14 1947 01/16/14 2332 01/17/14 0348 01/17/14 0813  GLUCAP 73 88 104* 118* 143*    Recent Results (from the past 240 hour(s))  SURGICAL PCR SCREEN     Status: Abnormal  Collection Time    01/15/14  1:51 PM      Result Value Ref Range Status   MRSA, PCR POSITIVE (*) NEGATIVE Final   Staphylococcus aureus POSITIVE (*) NEGATIVE Final   Comment:            The Xpert SA Assay (FDA     approved for NASAL specimens     in patients over 67 years of age),     is one component of     a comprehensive surveillance     program.  Test performance has     been validated by [employer] for patients greater     than or equal to 38 year old.     It is not intended     to diagnose infection nor to     guide or monitor treatment.     Studies: No results found.  Scheduled Meds: . antiseptic oral rinse  15 mL Mouth Rinse BID  . cefOXitin  2 g Intravenous 3 times per day  . Chlorhexidine Gluconate Cloth  6 each Topical Q0600  . enoxaparin (LOVENOX) injection   40 mg Subcutaneous Daily  . insulin aspart  0-9 Units Subcutaneous 6 times per day  . magnesium sulfate 1 - 4 g bolus IVPB  2 g Intravenous Once  . metoprolol  5 mg Intravenous 4 times per day  . mupirocin ointment  1 application Nasal BID  . pantoprazole (PROTONIX) IV  40 mg Intravenous QHS   Continuous Infusions: . dextrose 5 % and 0.9 % NaCl with KCl 40 mEq/L 80 mL/hr at 01/17/14 0945  . lactated ringers Stopped (01/16/14 0030)    Principal Problem:   SBO (small bowel obstruction) Active Problems:   Type II or unspecified type diabetes mellitus without mention of complication, uncontrolled   Unspecified essential hypertension   Tobacco use disorder    Time spent: Wendell MD [facility] Pager [phone]. If 7PM-7AM, please contact night-coverage at [email or web address], password [facility] 01/17/2014, 12:19 PM  LOS: 5 days

## 2014-01-17 NOTE — Progress Notes (Signed)
2 Days Post-Op  Subjective: Feels better, no flatus.  Dressing is dry.  Still not much in the way of BS.  Not using PCA she could not stand the beeping from CO2 monitor.  Objective: Vital signs in last 24 hours: Temp:  [98.1 F (36.7 C)-98.8 F (37.1 C)] 98.7 F (37.1 C) (02/25 0507) Pulse Rate:  [79-90] 90 (02/25 0507) Resp:  [12-26] 20 (02/25 0507) BP: (166-191)/(79-91) 180/79 mmHg (02/25 0507) SpO2:  [95 %-100 %] 95 % (02/25 0507) Last BM Date:  (PTA) 150 PO recorded.  450 from NG recorded 600 + in Cannister currently. Diet:  None Afebrile, VSS BP is up K+ 3.5, mag is 1.5 Intake/Output from previous day: 02/24 0701 - 02/25 0700 In: 2105 [P.O.:150; I.V.:1855; IV Piggyback:100] Out: 1125 [Urine:675; Emesis/NG output:450] Intake/Output this shift:    General appearance: alert, cooperative and no distress Resp: clear to auscultation bilaterally GI: soft not distended, still tender, dressing is dry. rare BS, no flatus or BM.  Lab Results:   Recent Labs  01/14/14 0917 01/15/14 0701  WBC 8.4 7.7  HGB 13.0 12.9  HCT 39.0 38.3  PLT 347 309    BMET  Recent Labs  01/15/14 0701 01/17/14 0344  NA 135* 136*  K 3.3* 3.5*  CL 95* 98  CO2 26 25  GLUCOSE 211* 129*  BUN 11 10  CREATININE 0.37* 0.41*  CALCIUM 9.0 8.4   PT/INR No results found for this basename: LABPROT, INR,  in the last 72 hours   Recent Labs Lab 01/12/14 1954  AST 19  ALT 15  ALKPHOS 75  BILITOT 0.5  PROT 8.0  ALBUMIN 3.7     Lipase     Component Value Date/Time   LIPASE 10* 01/12/2014 1954     Studies/Results: No results found.  Medications: . antiseptic oral rinse  15 mL Mouth Rinse BID  . cefOXitin  2 g Intravenous 3 times per day  . Chlorhexidine Gluconate Cloth  6 each Topical Q0600  . enoxaparin (LOVENOX) injection  40 mg Subcutaneous Daily  . HYDROmorphone PCA 0.3 mg/mL   Intravenous 6 times per day  . insulin aspart  0-9 Units Subcutaneous 6 times per day  . metoprolol   2 mg Intravenous 4 times per day  . mupirocin ointment  1 application Nasal BID  . pantoprazole (PROTONIX) IV  40 mg Intravenous QHS   . dextrose 5 % and 0.45 % NaCl with KCl 20 mEq/L 100 mL/hr at 01/17/14 0512  . lactated ringers Stopped (01/16/14 0030)   Prior to Admission medications   Medication Sig Start Date End Date Taking? Authorizing Provider  bisacodyl (DULCOLAX) 5 MG EC tablet Take 10 mg by mouth daily as needed for moderate constipation.   Yes Historical Provider, MD  insulin NPH-regular Human (NOVOLIN 70/30) (70-30) 100 UNIT/ML injection Inject 5 Units into the skin daily with breakfast.   Yes Historical Provider, MD  lisinopril (PRINIVIL,ZESTRIL) 20 MG tablet Take 0.5 tablets (10 mg total) by mouth daily. 11/01/13  Yes Reyne Dumas, MD     Assessment/Plan Closed loop small bowel obstruction with strangulated small bowel Exploratory laparotomy/ lysis of adhesions/ small bowel resection with primary anastomosis  Surgeon: TSUEI,MATTHEW K. 01/15/2014  Hypokalemia  DM type II  HTN  H/o CVA on disability for this. H/o CHF    Plan:  Mobilize more, replace K+, she is normally on Insulin, but her glucose is still low, I will leave D5 in IV.  Get rid of PCA  and go to just IV pain med.  IS.  Await Bowel function, increased BB for BP control. Continue NG for now.   LOS: 5 days    Krystine Pabst 01/17/2014

## 2014-01-17 NOTE — Progress Notes (Signed)
Some bowel sounds, but no flatus Minimal pain  Incision c/d/i  Awaiting bowel function Ambulate  Imogene Burn. Georgette Dover, MD, [facility] Surgery  General/ Trauma Surgery  01/17/2014 10:17 AM

## 2014-01-18 LAB — BASIC METABOLIC PANEL
BUN: 6 mg/dL (ref 6–23)
CO2: 23 mEq/L (ref 19–32)
CREATININE: 0.39 mg/dL — AB (ref 0.50–1.10)
Calcium: 9 mg/dL (ref 8.4–10.5)
Chloride: 99 mEq/L (ref 96–112)
GFR calc Af Amer: >90 mL/min (ref >90)
GLUCOSE: 117 mg/dL — AB (ref 70–99)
POTASSIUM: 4.8 meq/L (ref 3.7–5.3)
Sodium: 136 mEq/L — ABNORMAL LOW (ref 137–147)

## 2014-01-18 LAB — GLUCOSE, CAPILLARY
GLUCOSE-CAPILLARY: 119 mg/dL — AB (ref 70–99)
GLUCOSE-CAPILLARY: 129 mg/dL — AB (ref 70–99)
GLUCOSE-CAPILLARY: 106 mg/dL — AB (ref 70–99)
GLUCOSE-CAPILLARY: 174 mg/dL — AB (ref 70–99)
Glucose-Capillary: 117 mg/dL — ABNORMAL HIGH (ref 70–99)
Glucose-Capillary: 95 mg/dL (ref 70–99)

## 2014-01-18 LAB — MAGNESIUM: Magnesium: 1.7 mg/dL (ref 1.5–2.5)

## 2014-01-18 MED ORDER — SODIUM CHLORIDE 0.9 % IV SOLN
INTRAVENOUS | Status: DC
  Administered 2014-01-18: 09:00:00 via INTRAVENOUS
  Filled 2014-01-18: qty 1000

## 2014-01-18 MED ORDER — ENALAPRILAT 1.25 MG/ML IV SOLN
1.2500 mg | Freq: Three times a day (TID) | INTRAVENOUS | Status: DC
  Administered 2014-01-18 – 2014-01-19 (×3): 1.25 mg via INTRAVENOUS
  Filled 2014-01-18 (×6): qty 1

## 2014-01-18 MED ORDER — MAGNESIUM SULFATE 50 % IJ SOLN
3.0000 g | Freq: Once | INTRAVENOUS | Status: AC
  Administered 2014-01-18: 3 g via INTRAVENOUS
  Filled 2014-01-18: qty 6

## 2014-01-18 MED ORDER — METOPROLOL TARTRATE 1 MG/ML IV SOLN
10.0000 mg | Freq: Three times a day (TID) | INTRAVENOUS | Status: DC
  Administered 2014-01-18 – 2014-01-19 (×3): 10 mg via INTRAVENOUS
  Filled 2014-01-18 (×3): qty 10

## 2014-01-18 NOTE — Progress Notes (Signed)
3 Days Post-Op  Subjective: She is passing gas and having some loose stools.  Still a fair amount out thru the NG.  Objective: Vital signs in last 24 hours: Temp:  [98.3 F (36.8 C)-98.7 F (37.1 C)] 98.3 F (36.8 C) (02/26 0453) Pulse Rate:  [75-80] 75 (02/26 0453) Resp:  [18] 18 (02/26 0453) BP: (159-184)/(82-102) 184/102 mmHg (02/26 0700) SpO2:  [96 %-98 %] 98 % (02/26 0453) Last BM Date: 01/10/14 1100 per NG BP up again last PM and 6 AM K+ up to 4.8 Mag 1.7 Intake/Output from previous day: 02/25 0701 - 02/26 0700 In: 1817 [I.V.:1817] Out: 1750 [Urine:650; Emesis/NG output:1100] Intake/Output this shift:    General appearance: alert, cooperative and no distress Resp: clear to auscultation bilaterally and few rales at the bases GI: soft, + BS and flatus, some very loose stool. Incision looks good.  Lab Results:  No results found for this basename: WBC, HGB, HCT, PLT,  in the last 72 hours  BMET  Recent Labs  01/17/14 0344 01/18/14 0555  NA 136* 136*  K 3.5* 4.8  CL 98 99  CO2 25 23  GLUCOSE 129* 117*  BUN 10 6  CREATININE 0.41* 0.39*  CALCIUM 8.4 9.0   PT/INR No results found for this basename: LABPROT, INR,  in the last 72 hours   Recent Labs Lab 01/12/14 1954  AST 19  ALT 15  ALKPHOS 75  BILITOT 0.5  PROT 8.0  ALBUMIN 3.7     Lipase     Component Value Date/Time   LIPASE 10* 01/12/2014 1954     Studies/Results: No results found.  Medications: . antiseptic oral rinse  15 mL Mouth Rinse BID  . cefOXitin  2 g Intravenous 3 times per day  . Chlorhexidine Gluconate Cloth  6 each Topical Q0600  . enalaprilat  0.625 mg Intravenous 3 times per day  . enoxaparin (LOVENOX) injection  40 mg Subcutaneous Daily  . insulin aspart  0-9 Units Subcutaneous 6 times per day  . metoprolol  7.5 mg Intravenous 4 times per day  . mupirocin ointment  1 application Nasal BID  . pantoprazole (PROTONIX) IV  40 mg Intravenous QHS   . sodium chloride 0.9 %  1,000 mL infusion      Assessment/Plan Closed loop small bowel obstruction with strangulated small bowel  Exploratory laparotomy/ lysis of adhesions/ small bowel resection with primary anastomosis  Surgeon: TSUEI,MATTHEW K. 01/15/2014  Hypokalemia  DM type II  HTN  H/o CVA on disability for this.  H/o CHF   Plan: Clamp NG and see how she does.  K+ replaced, decrease IV fluid rate.  She now has Vasotec, lopressor, and hydralazine for her BP.  If she does well with clamped NG I will get it out later and start some clears/PO BP meds back. Recheck labs in AM.  LOS: 6 days    Alicia [name] 01/18/2014

## 2014-01-18 NOTE — Progress Notes (Signed)
[facility] Consult/PROGRESS NOTE  Alicia [name] Alicia [name] MRN:[id number] DOB: 1956/10/30 DOA: 01/12/2014 PCP: Angelica Chessman, MD  Assessment/Plan: #1 small bowel obstruction Patient with some clinical improvement. No nausea, no vomiting, no abdominal pain. Patient is passing gas. Patient with some loose stools today. Patient tolerating current diet. Diet has been advanced per general surgery. The patient tolerates that patient could possibly be discharged later on today. Follow.  #2 hypertension Increase metoprolol to 10 mg IV every 8 hours. Increase enalapril to 1.25 mg IV every 8 hours. Follow.  #3 UTI On IV cefoxitin.  #4 hypokalemia Repleted.  #5 type 2 diabetes Hemoglobin A1c was 9.1 in November of 2014.CBGs have ranged from [place]. Sliding scale insulin.   Code Status: full Family Communication: updated patient at bedside. Disposition Plan: per primary team.   Consultants:  [facility]ists:Dr. Tat 01/15/2049  Procedures:  Abdominal x-ray 01/14/2014, 01/13/2014  CT abdomen and pelvis 01/12/2014      Antibiotics:  IV cefoxitin 01/15/2014  IV Rocephin 01/12/2014 to 01/12/2014  HPI/Subjective: Patient denies any nausea, no vomiting, no abdominal pain. Patient states she is passing gas and had some loose stool.   Objective: Filed Vitals:   01/18/14 0700  BP: 184/102  Pulse:   Temp:   Resp:     Intake/Output Summary (Last 24 hours) at 01/18/14 1100 Last data filed at 01/18/14 0542  Gross per 24 hour  Intake   1817 ml  Output   1600 ml  Net    217 ml   Filed Weights   01/12/14 1957 01/13/14 0119 01/15/14 2045  Weight: 58.514 kg (129 lb) 58.1 kg (128 lb 1.4 oz) 61.9 kg (136 lb 7.4 oz)    Exam:   General:  nad  Cardiovascular: rrr  Respiratory: ctab  Abdomen: soft, nontender, nondistended, hypoactive bowel sounds  Musculoskeletal: no clubbing cyanosis or edema  Data Reviewed: Basic Metabolic Panel:  Recent Labs Lab  01/13/14 0405 01/14/14 0917 01/15/14 0701 01/16/14 1303 01/17/14 0344 01/18/14 0555  NA 138 143 135*  --  136* 136*  K 4.2 3.6* 3.3*  --  3.5* 4.8  CL 88* 96 95*  --  98 99  CO2 36* 29 26  --  25 23  GLUCOSE 170* 83 211*  --  129* 117*  BUN 42* 24* 11  --  10 6  CREATININE 1.30* 0.52 0.37*  --  0.41* 0.39*  CALCIUM 9.7 9.2 9.0  --  8.4 9.0  MG  --   --   --  1.5  --  1.7   Liver Function Tests:  Recent Labs Lab 01/12/14 1954  AST 19  ALT 15  ALKPHOS 75  BILITOT 0.5  PROT 8.0  ALBUMIN 3.7    Recent Labs Lab 01/12/14 1954  LIPASE 10*   No results found for this basename: AMMONIA,  in the last 168 hours CBC:  Recent Labs Lab 01/12/14 1954 01/13/14 0405 01/14/14 0917 01/15/14 0701  WBC 9.4 5.6 8.4 7.7  NEUTROABS 7.8*  --  6.3  --   HGB 14.6 13.1 13.0 12.9  HCT 41.7 38.3 39.0 38.3  MCV 80.0 80.0 83.0 81.8  PLT 382 337 347 309   Cardiac Enzymes: No results found for this basename: CKTOTAL, CKMB, CKMBINDEX, TROPONINI,  in the last 168 hours BNP (last 3 results) No results found for this basename: PROBNP,  in the last 8760 hours CBG:  Recent Labs Lab 01/17/14 1623 01/17/14 2011 01/18/14 0017 01/18/14 0423 01/18/14 0820  GLUCAP 177*  144* 117* 119* 129*    Recent Results (from the past 240 hour(s))  SURGICAL PCR SCREEN     Status: Abnormal   Collection Time    01/15/14  1:51 PM      Result Value Ref Range Status   MRSA, PCR POSITIVE (*) NEGATIVE Final   Staphylococcus aureus POSITIVE (*) NEGATIVE Final   Comment:            The Xpert SA Assay (FDA     approved for NASAL specimens     in patients over 3 years of age),     is one component of     a comprehensive surveillance     program.  Test performance has     been validated by [employer] for patients greater     than or equal to 33 year old.     It is not intended     to diagnose infection nor to     guide or monitor treatment.     Studies: No results found.  Scheduled Meds: .  antiseptic oral rinse  15 mL Mouth Rinse BID  . cefOXitin  2 g Intravenous 3 times per day  . Chlorhexidine Gluconate Cloth  6 each Topical Q0600  . enalaprilat  1.25 mg Intravenous 3 times per day  . enoxaparin (LOVENOX) injection  40 mg Subcutaneous Daily  . insulin aspart  0-9 Units Subcutaneous 6 times per day  . magnesium sulfate 1 - 4 g bolus IVPB  3 g Intravenous Once  . metoprolol  10 mg Intravenous 3 times per day  . mupirocin ointment  1 application Nasal BID  . pantoprazole (PROTONIX) IV  40 mg Intravenous QHS   Continuous Infusions: . sodium chloride 0.9 % 1,000 mL infusion 75 mL/hr at 01/18/14 0907    Principal Problem:   SBO (small bowel obstruction) Active Problems:   Type II or unspecified type diabetes mellitus without mention of complication, uncontrolled   Unspecified essential hypertension   Tobacco use disorder    Time spent: India Hook MD [facility] Pager [phone]. If 7PM-7AM, please contact night-coverage at [email or web address], password [facility] 01/18/2014, 11:00 AM  LOS: 6 days

## 2014-01-18 NOTE — Progress Notes (Signed)
Bowel function beginning to return Clamp NG tube May be ready to remove NG and advance diet later today.  Imogene Burn. Georgette Dover, MD, [facility]  General/ Trauma Surgery  01/18/2014 10:25 AM

## 2014-01-19 DIAGNOSIS — E876 Hypokalemia: Secondary | ICD-10-CM

## 2014-01-19 LAB — BASIC METABOLIC PANEL
BUN: 5 mg/dL — ABNORMAL LOW (ref 6–23)
CO2: 26 mEq/L (ref 19–32)
Calcium: 8.5 mg/dL (ref 8.4–10.5)
Chloride: 99 mEq/L (ref 96–112)
Creatinine, Ser: 0.4 mg/dL — ABNORMAL LOW (ref 0.50–1.10)
GFR calc Af Amer: >90 mL/min (ref >90)
Glucose, Bld: 113 mg/dL — ABNORMAL HIGH (ref 70–99)
POTASSIUM: 4 meq/L (ref 3.7–5.3)
SODIUM: 134 meq/L — AB (ref 137–147)

## 2014-01-19 LAB — CBC
HCT: 31.9 % — ABNORMAL LOW (ref 36.0–46.0)
HEMOGLOBIN: 10.9 g/dL — AB (ref 12.0–15.0)
MCH: 27.5 pg (ref 26.0–34.0)
MCHC: 34.2 g/dL (ref 30.0–36.0)
MCV: 80.4 fL (ref 78.0–100.0)
Platelets: 289 10*3/uL (ref 150–400)
RBC: 3.97 MIL/uL (ref 3.87–5.11)
RDW: 13.2 % (ref 11.5–15.5)
WBC: 6.3 10*3/uL (ref 4.0–10.5)

## 2014-01-19 LAB — GLUCOSE, CAPILLARY
GLUCOSE-CAPILLARY: 159 mg/dL — AB (ref 70–99)
GLUCOSE-CAPILLARY: 226 mg/dL — AB (ref 70–99)
Glucose-Capillary: 111 mg/dL — ABNORMAL HIGH (ref 70–99)
Glucose-Capillary: 116 mg/dL — ABNORMAL HIGH (ref 70–99)
Glucose-Capillary: 142 mg/dL — ABNORMAL HIGH (ref 70–99)
Glucose-Capillary: 160 mg/dL — ABNORMAL HIGH (ref 70–99)

## 2014-01-19 MED ORDER — OXYCODONE-ACETAMINOPHEN 5-325 MG PO TABS: 1.0000 | ORAL_TABLET | ORAL | Status: DC | PRN

## 2014-01-19 MED ORDER — HYDROCHLOROTHIAZIDE 12.5 MG PO CAPS
12.5000 mg | ORAL_CAPSULE | Freq: Once | ORAL | Status: AC
  Administered 2014-01-19: 12.5 mg via ORAL
  Filled 2014-01-19: qty 1

## 2014-01-19 MED ORDER — INSULIN ASPART PROT & ASPART (70-30 MIX) 100 UNIT/ML ~~LOC~~ SUSP
5.0000 [IU] | Freq: Every day | SUBCUTANEOUS | Status: DC
  Filled 2014-01-19: qty 10

## 2014-01-19 MED ORDER — METOPROLOL TARTRATE 1 MG/ML IV SOLN: 10.0000 mg | Freq: Four times a day (QID) | INTRAVENOUS | Status: DC

## 2014-01-19 MED ORDER — HYDROCHLOROTHIAZIDE 12.5 MG PO CAPS
12.5000 mg | ORAL_CAPSULE | Freq: Every day | ORAL | Status: DC
  Administered 2014-01-19: 12.5 mg via ORAL
  Filled 2014-01-19: qty 1

## 2014-01-19 MED ORDER — INSULIN ASPART PROT & ASPART (70-30 MIX) 100 UNIT/ML ~~LOC~~ SUSP
5.0000 [IU] | Freq: Two times a day (BID) | SUBCUTANEOUS | Status: DC
  Administered 2014-01-20: 5 [IU] via SUBCUTANEOUS
  Filled 2014-01-19: qty 10

## 2014-01-19 MED ORDER — LISINOPRIL 20 MG PO TABS
20.0000 mg | ORAL_TABLET | Freq: Every day | ORAL | Status: DC
  Administered 2014-01-20: 20 mg via ORAL
  Filled 2014-01-19 (×2): qty 1

## 2014-01-19 MED ORDER — PANTOPRAZOLE SODIUM 40 MG PO TBEC
40.0000 mg | DELAYED_RELEASE_TABLET | Freq: Every day | ORAL | Status: DC
  Administered 2014-01-19: 40 mg via ORAL
  Filled 2014-01-19: qty 1

## 2014-01-19 MED ORDER — HYDROCHLOROTHIAZIDE 25 MG PO TABS
25.0000 mg | ORAL_TABLET | Freq: Every day | ORAL | Status: DC
  Administered 2014-01-20: 25 mg via ORAL
  Filled 2014-01-19 (×2): qty 1

## 2014-01-19 MED ORDER — LISINOPRIL 10 MG PO TABS
10.0000 mg | ORAL_TABLET | Freq: Every day | ORAL | Status: DC
  Administered 2014-01-19: 10 mg via ORAL
  Filled 2014-01-19: qty 1

## 2014-01-19 MED ORDER — LISINOPRIL 10 MG PO TABS
10.0000 mg | ORAL_TABLET | ORAL | Status: AC
  Administered 2014-01-19: 10 mg via ORAL

## 2014-01-19 MED ORDER — ENALAPRILAT 1.25 MG/ML IV SOLN
1.2500 mg | Freq: Four times a day (QID) | INTRAVENOUS | Status: DC
  Filled 2014-01-19 (×4): qty 1

## 2014-01-19 NOTE — Progress Notes (Signed)
NUTRITION FOLLOW UP  Intervention:   Encourage PO intake as tolerated Provide snack once daily  Nutrition Dx:   Inadequate oral intake related to altered GI function as evidenced by NPO status; improving, diet advanced today  Goal:   Pt to meet >/= 90% of their estimated nutrition needs; currently unmet  Monitor:   PO diet advancement & intake, weight, labs, I/O's  Assessment:   42 yof who was admitted for what she states was "food poisoning" in November with ct showing ileus. Since then she has had constipation. She has not had a bm in a week. Over past few days she has had increasing abdominal pain associated with n/v. This was not getting better. She has passed a little flatus during this and even today she says. Nothing she was doing at home was making better. Has not been eating.   s/p Procedure(s):  EXPLORATORY LAPAROTOMY SMALL BOWEL RESECTION (N/A)  LYSIS OF ADHESION (N/A)  Diet advanced to clear liquids 2/26, full liquids this morning, and carb modified this afternoon. Pt having BM's. Pt states she feels much better and her appetite is good. Pt reports eating 100% of lunch today (full liquids). Encouraged pt to stay well hydrated and avoid large meals. Pt interested in receiving snacks. Her weight has trended up 8 lbs.  Net I/O since admission is + 6.3 L.     Height: Ht Readings from Last 1 Encounters:  01/15/14 5\' 6"  (1.676 m)    Weight Status:   Wt Readings from Last 1 Encounters:  01/15/14 136 lb 7.4 oz (61.9 kg)    Re-estimated needs:  Kcal: 1750-1950  Protein: 85-95 gm  Fluid: 1.7-1.9 L  Skin: abdominal incision  Diet Order: Carb Control   Intake/Output Summary (Last 24 hours) at 01/19/14 1503 Last data filed at 01/19/14 0700  Gross per 24 hour  Intake   1860 ml  Output    425 ml  Net   1435 ml    Last BM: 2/26   Labs:   Recent Labs Lab 01/16/14 1303 01/17/14 0344 01/18/14 0555 01/19/14 0332  NA  --  136* 136* 134*  K  --  3.5* 4.8 4.0   CL  --  98 99 99  CO2  --  25 23 26   BUN  --  10 6 5*  CREATININE  --  0.41* 0.39* 0.40*  CALCIUM  --  8.4 9.0 8.5  MG 1.5  --  1.7  --   GLUCOSE  --  129* 117* 113*    CBG (last 3)   Recent Labs  01/19/14 0333 01/19/14 0800 01/19/14 1149  GLUCAP 111* 116* 159*    Scheduled Meds: . Chlorhexidine Gluconate Cloth  6 each Topical Q0600  . enoxaparin (LOVENOX) injection  40 mg Subcutaneous Daily  . [START ON 01/20/2014] hydrochlorothiazide  25 mg Oral Daily  . insulin aspart  0-9 Units Subcutaneous 6 times per day  . [START ON 01/20/2014] insulin aspart protamine- aspart  5 Units Subcutaneous Q breakfast  . [START ON 01/20/2014] lisinopril  20 mg Oral Daily  . mupirocin ointment  1 application Nasal BID  . pantoprazole  40 mg Oral QHS    Continuous Infusions:   Pryor Ochoa RD, LDN Inpatient Clinical Dietitian Pager: [phone] After Hours Pager: [phone]

## 2014-01-19 NOTE — Progress Notes (Signed)
BP still up (195/90 @ 1000 AM)  after 0.625 mg Vasotec at 6:54AM, Hctz 12.5 mg at 0954, lisinopril 10 mg at 9:54 AM, lopressor 10 mg @0654  BP still up so I have given her another 10 mg of lisinopril. BP currently 176/89 automatic cuff @ 12:35 PM. Increased her lisinopril to 20 mg daily starting in the AM tomorrow if her BP isn't better.  She does not check her BP at home,  BP seemed to be OK on admit.  She was on lisinopril 20 mg tablets, .5 tablets daily before admit.

## 2014-01-19 NOTE — Progress Notes (Signed)
4 Days Post-Op  Subjective: She says she's eating like she never ate before.  She is also having stools, not recorded.  She feels much better.  Objective: Vital signs in last 24 hours: Temp:  [97.6 F (36.4 C)-98 F (36.7 C)] 98 F (36.7 C) (02/27 0505) Pulse Rate:  [65-84] 65 (02/27 0505) Resp:  [16-20] 17 (02/27 0505) BP: (177-188)/(74-91) 182/81 mmHg (02/27 0505) SpO2:  [98 %-100 %] 98 % (02/27 0505) Last BM Date: 01/18/14 1 stool, nothing PO recorded BP is still up Clear diet Day 4 of Cefoxitin Labs OK Intake/Output from previous day: 02/26 0701 - 02/27 0700 In: 1860 [I.V.:1860] Out: 425 [Urine:425] Intake/Output this shift:    General appearance: alert, cooperative and no distress Resp: clear to auscultation bilaterally GI: sore + BS, and BM, incision looks fine.  Lab Results:   Recent Labs  01/19/14 0332  WBC 6.3  HGB 10.9*  HCT 31.9*  PLT 289    BMET  Recent Labs  01/18/14 0555 01/19/14 0332  NA 136* 134*  K 4.8 4.0  CL 99 99  CO2 23 26  GLUCOSE 117* 113*  BUN 6 5*  CREATININE 0.39* 0.40*  CALCIUM 9.0 8.5   PT/INR No results found for this basename: LABPROT, INR,  in the last 72 hours   Recent Labs Lab 01/12/14 1954  AST 19  ALT 15  ALKPHOS 75  BILITOT 0.5  PROT 8.0  ALBUMIN 3.7     Lipase     Component Value Date/Time   LIPASE 10* 01/12/2014 1954     Studies/Results: No results found.  Medications: . cefOXitin  2 g Intravenous 3 times per day  . Chlorhexidine Gluconate Cloth  6 each Topical Q0600  . enalaprilat  1.25 mg Intravenous 4 times per day  . enoxaparin (LOVENOX) injection  40 mg Subcutaneous Daily  . insulin aspart  0-9 Units Subcutaneous 6 times per day  . metoprolol  10 mg Intravenous 4 times per day  . mupirocin ointment  1 application Nasal BID  . pantoprazole (PROTONIX) IV  40 mg Intravenous QHS   Prior to Admission medications   Medication Sig Start Date End Date Taking? Authorizing Provider  bisacodyl  (DULCOLAX) 5 MG EC tablet Take 10 mg by mouth daily as needed for moderate constipation.   Yes Historical Provider, MD  insulin NPH-regular Human (NOVOLIN 70/30) (70-30) 100 UNIT/ML injection Inject 5 Units into the skin daily with breakfast.   Yes Historical Provider, MD  lisinopril (PRINIVIL,ZESTRIL) 20 MG tablet Take 0.5 tablets (10 mg total) by mouth daily. 11/01/13  Yes Reyne Dumas, MD     Assessment/Plan Closed loop small bowel obstruction with strangulated small bowel  Exploratory laparotomy/ lysis of adhesions/ small bowel resection with primary anastomosis  Surgeon: TSUEI,MATTHEW K. 01/15/2014  Hypokalemia  DM type II  HTN  H/o CVA on disability for this.  H/o CHF  Plan:  Full liquids now and lunch, soft diet for supper and breakfast, hopefully home in Am.  Restart home lisinopril, add 12.5 mg Hydrochlorthiazide, stop her IV fluids.  Stop antibiotics. Restart AM insulin if glucose is up enough. I have stopped the IV medicines for her BP, will check with medicine.  She only had ACE preop 01/13/14 her admit SBP was between 114-140 range, but it has been up since 01/14/14.   LOS: 7 days    Earl Losee 01/19/2014

## 2014-01-19 NOTE — Progress Notes (Signed)
Alicia [name]. Alicia Dover, MD, [facility] Surgery  General/ Trauma Surgery  01/19/2014 1:12 PM

## 2014-01-19 NOTE — Progress Notes (Signed)
[facility] Consult/PROGRESS NOTE  Alicia [name] Alicia [name] MRN:[id number] DOB: 11-Oct-1956 DOA: 01/12/2014 PCP: Angelica Chessman, MD  Assessment/Plan: #1 small bowel obstruction Patient with some clinical improvement. No nausea, no vomiting, no abdominal pain. Patient is passing gas. Patient with stools. NG tube has been discontinued. Patient tolerating current diet. Diet has been advanced per general surgery. Per primary team.  #2 hypertension Patient currently tolerating oral intake. Agree with discontinuing IV antihypertensive medications. Continue lisinopril 20 mg daily. Will recommend increasing HCTZ to 25 mg daily. Follow.  #3 UTI D/C IV cefoxitin.  #4 hypokalemia Repleted.  #5 type 2 diabetes Hemoglobin A1c was 9.1 in November of 2014.CBGs have ranged from [place]. Resume 70/35 units twice daily. Sliding scale insulin.   Code Status: full Family Communication: updated patient at bedside. Disposition Plan: per primary team.   Consultants:  [facility] hospitalists:Dr. Tat 01/15/2049  Procedures:  Abdominal x-ray 01/14/2014, 01/13/2014  CT abdomen and pelvis 01/12/2014      Antibiotics:  IV cefoxitin 01/15/2014--> 01/19/14  IV Rocephin 01/12/2014 to 01/12/2014  HPI/Subjective: Patient denies any nausea, no vomiting, no abdominal pain. Patient states she is passing gas and having bowel movements.   Objective: Filed Vitals:   01/19/14 1305  BP: 169/77  Pulse: 70  Temp: 97.9 F (36.6 C)  Resp: 18    Intake/Output Summary (Last 24 hours) at 01/19/14 1415 Last data filed at 01/19/14 0700  Gross per 24 hour  Intake   1860 ml  Output    425 ml  Net   1435 ml   Filed Weights   01/12/14 1957 01/13/14 0119 01/15/14 2045  Weight: 58.514 kg (129 lb) 58.1 kg (128 lb 1.4 oz) 61.9 kg (136 lb 7.4 oz)    Exam:   General:  nad  Cardiovascular: rrr  Respiratory: ctab  Abdomen: soft, nontender, nondistended, hypoactive bowel sounds  Musculoskeletal: no  clubbing cyanosis or edema  Data Reviewed: Basic Metabolic Panel:  Recent Labs Lab 01/14/14 0917 01/15/14 0701 01/16/14 1303 01/17/14 0344 01/18/14 0555 01/19/14 0332  NA 143 135*  --  136* 136* 134*  K 3.6* 3.3*  --  3.5* 4.8 4.0  CL 96 95*  --  98 99 99  CO2 29 26  --  25 23 26   GLUCOSE 83 211*  --  129* 117* 113*  BUN 24* 11  --  10 6 5*  CREATININE 0.52 0.37*  --  0.41* 0.39* 0.40*  CALCIUM 9.2 9.0  --  8.4 9.0 8.5  MG  --   --  1.5  --  1.7  --    Liver Function Tests:  Recent Labs Lab 01/12/14 1954  AST 19  ALT 15  ALKPHOS 75  BILITOT 0.5  PROT 8.0  ALBUMIN 3.7    Recent Labs Lab 01/12/14 1954  LIPASE 10*   No results found for this basename: AMMONIA,  in the last 168 hours CBC:  Recent Labs Lab 01/12/14 1954 01/13/14 0405 01/14/14 0917 01/15/14 0701 01/19/14 0332  WBC 9.4 5.6 8.4 7.7 6.3  NEUTROABS 7.8*  --  6.3  --   --   HGB 14.6 13.1 13.0 12.9 10.9*  HCT 41.7 38.3 39.0 38.3 31.9*  MCV 80.0 80.0 83.0 81.8 80.4  PLT 382 337 347 309 289   Cardiac Enzymes: No results found for this basename: CKTOTAL, CKMB, CKMBINDEX, TROPONINI,  in the last 168 hours BNP (last 3 results) No results found for this basename: PROBNP,  in the last 8760 hours CBG:  Recent Labs Lab 01/18/14 1956 01/18/14 2357 01/19/14 0333 01/19/14 0800 01/19/14 1149  GLUCAP 174* 142* 111* 116* 159*    Recent Results (from the past 240 hour(s))  SURGICAL PCR SCREEN     Status: Abnormal   Collection Time    01/15/14  1:51 PM      Result Value Ref Range Status   MRSA, PCR POSITIVE (*) NEGATIVE Final   Staphylococcus aureus POSITIVE (*) NEGATIVE Final   Comment:            The Xpert SA Assay (FDA     approved for NASAL specimens     in patients over 31 years of age),     is one component of     a comprehensive surveillance     program.  Test performance has     been validated by [employer] for patients greater     than or equal to 34 year old.     It is not  intended     to diagnose infection nor to     guide or monitor treatment.     Studies: No results found.  Scheduled Meds: . Chlorhexidine Gluconate Cloth  6 each Topical Q0600  . enoxaparin (LOVENOX) injection  40 mg Subcutaneous Daily  . [START ON 01/20/2014] hydrochlorothiazide  25 mg Oral Daily  . hydrochlorothiazide  12.5 mg Oral Once  . insulin aspart  0-9 Units Subcutaneous 6 times per day  . [START ON 01/20/2014] insulin aspart protamine- aspart  5 Units Subcutaneous Q breakfast  . [START ON 01/20/2014] lisinopril  20 mg Oral Daily  . mupirocin ointment  1 application Nasal BID  . pantoprazole  40 mg Oral QHS   Continuous Infusions:    Principal Problem:   SBO (small bowel obstruction) Active Problems:   Type II or unspecified type diabetes mellitus without mention of complication, uncontrolled   Unspecified essential hypertension   Tobacco use disorder    Time spent: [place] MD [facility] Pager [phone]. If 7PM-7AM, please contact night-coverage at [email or web address], password [facility] 01/19/2014, 2:15 PM  LOS: 7 days

## 2014-01-19 NOTE — Progress Notes (Signed)
Excellent spirits Wound OK Will advance diet Hopefully home tomorrow.  Imogene Burn. Georgette Dover, MD, [facility] Surgery  General/ Trauma Surgery  01/19/2014 11:29 AM

## 2014-01-20 LAB — BASIC METABOLIC PANEL
BUN: 6 mg/dL (ref 6–23)
CALCIUM: 9.1 mg/dL (ref 8.4–10.5)
CO2: 29 mEq/L (ref 19–32)
CREATININE: 0.58 mg/dL (ref 0.50–1.10)
Chloride: 96 mEq/L (ref 96–112)
GFR calc Af Amer: >90 mL/min (ref >90)
GFR calc non Af Amer: >90 mL/min (ref >90)
Glucose, Bld: 184 mg/dL — ABNORMAL HIGH (ref 70–99)
Potassium: 4.6 mEq/L (ref 3.7–5.3)
Sodium: 134 mEq/L — ABNORMAL LOW (ref 137–147)

## 2014-01-20 LAB — GLUCOSE, CAPILLARY
GLUCOSE-CAPILLARY: 158 mg/dL — AB (ref 70–99)
Glucose-Capillary: 121 mg/dL — ABNORMAL HIGH (ref 70–99)
Glucose-Capillary: 131 mg/dL — ABNORMAL HIGH (ref 70–99)
Glucose-Capillary: 182 mg/dL — ABNORMAL HIGH (ref 70–99)

## 2014-01-20 MED ORDER — LISINOPRIL 20 MG PO TABS: 20.0000 mg | ORAL_TABLET | Freq: Every day | ORAL | Status: DC

## 2014-01-20 MED ORDER — HYDROCHLOROTHIAZIDE 25 MG PO TABS: 25.0000 mg | ORAL_TABLET | Freq: Every day | ORAL | Status: DC

## 2014-01-20 MED ORDER — OXYCODONE-ACETAMINOPHEN 5-325 MG PO TABS: 1.0000 | ORAL_TABLET | ORAL | Status: DC | PRN

## 2014-01-20 MED ORDER — ACETAMINOPHEN 325 MG PO TABS: 650.0000 mg | ORAL_TABLET | Freq: Four times a day (QID) | ORAL | Status: DC | PRN

## 2014-01-20 NOTE — Progress Notes (Signed)
Ready for discharge. Follow-up next week for staple removal.  Imogene Burn. Georgette Dover, MD, [facility] Surgery  General/ Trauma Surgery  01/20/2014 9:55 AM

## 2014-01-20 NOTE — Progress Notes (Signed)
DC instructions reviewed with patient, questions answered, verbalized understanding.  Patient given written prescriptions for blood pressure medications and pain medication.  Patient transported to front of hospital to be taken home by daughter via wheelchair accompanied by RN.  RN made daughter aware of patient's prescription medications.  Patient in good condition at time leaving [facility].

## 2014-01-20 NOTE — Discharge Summary (Signed)
Imogene Burn. Georgette Dover, MD, [facility] Surgery  General/ Trauma Surgery  01/20/2014 11:46 AM

## 2014-01-20 NOTE — Progress Notes (Signed)
5 Days Post-Op  Subjective: She is doing well from our standpoint. Her BP is still up, she did not have issues with it before this admit, well controlled on 10 mg lisinopril.  Objective: Vital signs in last 24 hours: Temp:  [97.8 F (36.6 C)-98.2 F (36.8 C)] 97.9 F (36.6 C) (02/28 0442) Pulse Rate:  [70-90] 71 (02/28 0442) Resp:  [18-20] 20 (02/28 0442) BP: (153-195)/(73-95) 153/95 mmHg (02/28 0442) SpO2:  [95 %-99 %] 95 % (02/28 0442) Last BM Date: 01/19/14 600 Po CArb Mod Diet BP still up BP 153/95  Pulse 71  Temp(Src) 97.9 F (36.6 C) (Oral)  Resp 20  Ht 5\' 6"  (1.676 m)  Wt 61.9 kg (136 lb 7.4 oz)  BMI 22.04 kg/m2  SpO2 95% No labs Intake/Output from previous day: 02/27 0701 - 02/28 0700 In: 600 [P.O.:600] Out: 400 [Urine:400] Intake/Output this shift:    General appearance: alert, cooperative and no distress Resp: clear to auscultation bilaterally GI: soft, non-tender; bowel sounds normal; no masses,  no organomegaly Staples and wound look good. Lab Results:   Recent Labs  01/19/14 0332  WBC 6.3  HGB 10.9*  HCT 31.9*  PLT 289    BMET  Recent Labs  01/19/14 0332 01/20/14 0355  NA 134* 134*  K 4.0 4.6  CL 99 96  CO2 26 29  GLUCOSE 113* 184*  BUN 5* 6  CREATININE 0.40* 0.58  CALCIUM 8.5 9.1   PT/INR No results found for this basename: LABPROT, INR,  in the last 72 hours  No results found for this basename: AST, ALT, ALKPHOS, BILITOT, PROT, ALBUMIN,  in the last 168 hours   Lipase     Component Value Date/Time   LIPASE 10* 01/12/2014 1954     Studies/Results: No results found.  Medications: . enoxaparin (LOVENOX) injection  40 mg Subcutaneous Daily  . hydrochlorothiazide  25 mg Oral Daily  . insulin aspart  0-9 Units Subcutaneous 6 times per day  . insulin aspart protamine- aspart  5 Units Subcutaneous BID WC  . lisinopril  20 mg Oral Daily  . mupirocin ointment  1 application Nasal BID  . pantoprazole  40 mg Oral QHS     Assessment/Plan Closed loop small bowel obstruction with strangulated small bowel  Exploratory laparotomy/ lysis of adhesions/ small bowel resection with primary anastomosis  Surgeon: TSUEI,MATTHEW K. 01/15/2014  Hypokalemia  DM type II  HTN (currently with poor control) H/o CVA on disability for this.  H/o CHF   Plan:  Staples out next week, I will check with Medicine on D/c BP meds. I have told her to see her PCP next week and get BP checked, follow up with them for her BP.  Follow up with Dr. Georgette Dover in 2 weeks.  LOS: 8 days    Alicia [name] 01/20/2014

## 2014-01-20 NOTE — Discharge Summary (Signed)
Physician Discharge Summary  Patient ID: Alicia [name] MRN: [id number] DOB/AGE: [birth year] 58 y.o.  Admit date: 01/12/2014 Discharge date: 01/20/2014  Admission Diagnoses:  PSBO no prior surgery DM-SSI  HTN-controlled at present  H/o CVA  Discharge Diagnoses:  Closed loop small bowel obstruction with strangulated small bowel Hypokalemia  DM type II  HTN  H/o CVA on disability for this.  H/o CHF  Principal Problem:   SBO (small bowel obstruction) Active Problems:   Type II or unspecified type diabetes mellitus without mention of complication, uncontrolled   Unspecified essential hypertension   Tobacco use disorder   PROCEDURES: Exploratory laparotomy/ lysis of adhesions/ small bowel resection with primary anastomosis  Surgeon: Maia Petties 01/15/2014    Hospital Course:  58 yof who was admitted for what she states was "food poisoning" in November with ct showing ileus. Since then she has had constipation. She has not had a bm in a week. Over past few days she has had increasing abdominal pain associated with n/v. This was not getting better. She has passed a little flatus during this and even today she says. Nothing she was doing at home was making better. Has not been eating.  She has no history of colonoscopy. She does state she has some unknown cardiac issue which she terms an "irregular heartbeat". Also says she has had several strokes but no etiology was identified and no other treatment instituted.  Pt was admitted and showed no improvement over the next 48 hours, she had high output NG drainage and was making no progress.  She was taken to the OR on 01/15/14.  She was found to have a closed loop small bowel hernia with a strangulated bowel  She underwent resection and repair of the hernia, and resection of the SB.  Post op she had some ileus, and significant issues with hypertension.  Medicine and Dr. Joretta Bachelor helped with control of her BP.  As her bowel function  returned her diet was advanced.  Her BP required a doubling of her lisinopril and adding HCTZ.   She was ready and BP was improving on 2/28. And she was discharged home.  She is to follow up with PCP next week to monitor her BP.  She will get an appointment for staple removal next week, and see Dr. Georgette Dover in 2 weeks.  Condition on D/C:  Improved    Disposition: 01-Home or Self Care   Future Appointments Provider Department Dept Phone   02/19/2014 9:30 AM Lorayne Marek, MD [place] [phone]       Medication List    STOP taking these medications       bisacodyl 5 MG EC tablet  Commonly known as:  DULCOLAX      TAKE these medications       acetaminophen 325 MG tablet  Commonly known as:  TYLENOL  Take 2 tablets (650 mg total) by mouth every 6 (six) hours as needed for mild pain (or Temp > 100).     hydrochlorothiazide 25 MG tablet  Commonly known as:  HYDRODIURIL  Take 1 tablet (25 mg total) by mouth daily.     insulin NPH-regular Human (70-30) 100 UNIT/ML injection  Commonly known as:  NOVOLIN 70/30  Inject 5 Units into the skin daily with breakfast.     lisinopril 20 MG tablet  Commonly known as:  PRINIVIL,ZESTRIL  Take 1 tablet (20 mg total) by mouth daily.     oxyCODONE-acetaminophen 5-325 MG  per tablet  Commonly known as:  PERCOCET/ROXICET  Take 1-2 tablets by mouth every 4 (four) hours as needed for moderate pain.           Follow-up Information   Follow up with CCS,MD, MD. (Call and get an appointment for staples out next week.)    Specialty:  General Surgery      Follow up with Maia Petties., MD. Schedule an appointment as soon as possible for a visit in 2 weeks.   Specialty:  General Surgery   Contact information:   [address] [place] [address] [phone]       Follow up with Angelica Chessman, MD. Schedule an appointment as soon as possible for a visit in 1 week. (Call and make an  appointment for your blood pressure check next week, tell them it was high the entire time you were in the hospital.)    Specialty:  Internal Medicine   Contact information:   [place] [address] [phone]       Signed: Earnstine Regal 01/20/2014, 11:21 AM

## 2014-01-25 ENCOUNTER — Encounter (INDEPENDENT_AMBULATORY_CARE_PROVIDER_SITE_OTHER): Payer: Self-pay

## 2014-01-25 ENCOUNTER — Ambulatory Visit (INDEPENDENT_AMBULATORY_CARE_PROVIDER_SITE_OTHER): Payer: Medicaid Other

## 2014-02-06 ENCOUNTER — Ambulatory Visit (INDEPENDENT_AMBULATORY_CARE_PROVIDER_SITE_OTHER): Payer: Medicaid Other | Admitting: Surgery

## 2014-02-06 ENCOUNTER — Encounter (INDEPENDENT_AMBULATORY_CARE_PROVIDER_SITE_OTHER): Payer: Self-pay | Admitting: Surgery

## 2014-02-06 VITALS — BP 160/90 | HR 80 | Temp 98.0°F | Resp 16 | Ht 66.0 in | Wt 136.0 lb

## 2014-02-06 DIAGNOSIS — K56609 Unspecified intestinal obstruction, unspecified as to partial versus complete obstruction: Secondary | ICD-10-CM

## 2014-02-06 NOTE — Progress Notes (Signed)
Status post exploratory laparotomy with small bowel resection for a closed loop bowel obstruction on 01/15/14.  She was discharged on 01/20/14. She is doing reasonably well. She has developed some constipation and has some left lower quadrant abdominal pain. However she denies any nausea. She has a good appetite. Her incision is well-healed with no sign of infection. Her abdomen is soft with good bowel sounds. No palpable masses on the left side.  Status post small bowel resection for closed loop bowel obstruction. She is still having some constipation so recommended use of some MiraLAX. No sign of recurrent obstruction. She may followup as needed.  Alicia [name]. Alicia Dover, MD, [facility]  General/ Trauma Surgery  02/06/2014 12:33 PM

## 2014-02-06 NOTE — Patient Instructions (Signed)
Miralax (polyethylene glycol) - take one dose a day until bowel movements are normal.

## 2014-02-09 ENCOUNTER — Ambulatory Visit: Payer: Medicaid Other | Attending: Internal Medicine | Admitting: Internal Medicine

## 2014-02-09 ENCOUNTER — Encounter: Payer: Self-pay | Admitting: Internal Medicine

## 2014-02-09 VITALS — BP 150/80 | HR 97 | Temp 97.2°F | Wt 138.0 lb

## 2014-02-09 DIAGNOSIS — Z794 Long term (current) use of insulin: Secondary | ICD-10-CM | POA: Insufficient documentation

## 2014-02-09 DIAGNOSIS — E119 Type 2 diabetes mellitus without complications: Secondary | ICD-10-CM

## 2014-02-09 DIAGNOSIS — I10 Essential (primary) hypertension: Secondary | ICD-10-CM

## 2014-02-09 DIAGNOSIS — F17200 Nicotine dependence, unspecified, uncomplicated: Secondary | ICD-10-CM | POA: Insufficient documentation

## 2014-02-09 DIAGNOSIS — Z8673 Personal history of transient ischemic attack (TIA), and cerebral infarction without residual deficits: Secondary | ICD-10-CM | POA: Insufficient documentation

## 2014-02-09 DIAGNOSIS — Z79899 Other long term (current) drug therapy: Secondary | ICD-10-CM | POA: Insufficient documentation

## 2014-02-09 DIAGNOSIS — I509 Heart failure, unspecified: Secondary | ICD-10-CM | POA: Insufficient documentation

## 2014-02-09 MED ORDER — HYDROCHLOROTHIAZIDE 25 MG PO TABS: 25.0000 mg | ORAL_TABLET | Freq: Every day | ORAL | Status: DC

## 2014-02-09 MED ORDER — LISINOPRIL 20 MG PO TABS: 20.0000 mg | ORAL_TABLET | Freq: Every day | ORAL | Status: DC

## 2014-02-09 NOTE — Progress Notes (Signed)
MRN: [id number] Name: Alicia [name]  Sex: female Age: 58 y.o. DOB: 30-Aug-1956  Allergies: Diona Fanti and Other  Chief Complaint  Patient presents with  . Hypertension    HPI: Patient is 58 y.o. female who has to of diabetes, hypertension comes today reported to have her blood pressure being elevated after she had abdominal surgery, her blood pressure medication was recently increased currently she is taking hydrochlorothiazide 25 mg as well as lisinopril 20 mg, she denies any headache dizziness, for diabetes she has been taking Novolin today her hemoglobin A1c in the office checked has improved.  Past Medical History  Diagnosis Date  . Diabetes mellitus, type II   . Hypertension   . H/O: CVA (cerebrovascular accident)     6 strokes, most recent on 05/10/13  . CHF (congestive heart failure)   . Stroke     Past Surgical History  Procedure Laterality Date  . Tubal ligation    . Cardiac catheterization    . Laparotomy N/A 01/15/2014    Procedure: EXPLORATORY LAPAROTOMY SMALL BOWEL RESECTION;  Surgeon: Imogene Burn. Georgette Dover, MD;  Location: [place];  Service: General;  Laterality: N/A;  . Lysis of adhesion N/A 01/15/2014    Procedure: LYSIS OF ADHESION;  Surgeon: Imogene Burn. Georgette Dover, MD;  Location: [place];  Service: General;  Laterality: N/A;      Medication List       This list is accurate as of: 02/09/14  5:14 PM.  Always use your most recent med list.               hydrochlorothiazide 25 MG tablet  Commonly known as:  HYDRODIURIL  Take 1 tablet (25 mg total) by mouth daily.     insulin NPH-regular Human (70-30) 100 UNIT/ML injection  Commonly known as:  NOVOLIN 70/30  Inject 5 Units into the skin daily with breakfast.     lisinopril 20 MG tablet  Commonly known as:  PRINIVIL,ZESTRIL  Take 1 tablet (20 mg total) by mouth daily.        Meds ordered this encounter  Medications  . lisinopril (PRINIVIL,ZESTRIL) 20 MG tablet    Sig: Take 1 tablet (20 mg total) by mouth daily.   Dispense:  30 tablet    Refill:  3  . hydrochlorothiazide (HYDRODIURIL) 25 MG tablet    Sig: Take 1 tablet (25 mg total) by mouth daily.    Dispense:  30 tablet    Refill:  3    Immunization History  Administered Date(s) Administered  . Influenza,inj,Quad PF,36+ Mos 10/16/2013    Family History  Problem Relation Age of Onset  . Diabetes Mellitus II Mother   . Hypertension Mother   . CVA Mother     History  Substance Use Topics  . Smoking status: Current Every Day Smoker -- 0.50 packs/day for 25 years    Types: Cigarettes  . Smokeless tobacco: Never Used  . Alcohol Use: No    Review of Systems   As noted in HPI  Filed Vitals:   02/09/14 1714  BP: 150/80  Pulse:   Temp:     Physical Exam  Physical Exam  Constitutional: No distress.  Cardiovascular: Normal rate and regular rhythm.   Pulmonary/Chest: Breath sounds normal. No respiratory distress. She has no wheezes. She has no rales.  Musculoskeletal: She exhibits no edema.    CBC    Component Value Date/Time   WBC 6.3 01/19/2014 0332   RBC 3.97 01/19/2014 0332  HGB 10.9* 01/19/2014 0332   HCT 31.9* 01/19/2014 0332   PLT 289 01/19/2014 0332   MCV 80.4 01/19/2014 0332   LYMPHSABS 1.2 01/14/2014 0917   MONOABS 0.9 01/14/2014 0917   EOSABS 0.0 01/14/2014 0917   BASOSABS 0.0 01/14/2014 0917    CMP     Component Value Date/Time   NA 134* 01/20/2014 0355   K 4.6 01/20/2014 0355   CL 96 01/20/2014 0355   CO2 29 01/20/2014 0355   GLUCOSE 184* 01/20/2014 0355   BUN 6 01/20/2014 0355   CREATININE 0.58 01/20/2014 0355   CREATININE 0.46* 11/01/2013 1238   CALCIUM 9.1 01/20/2014 0355   PROT 8.0 01/12/2014 1954   ALBUMIN 3.7 01/12/2014 1954   AST 19 01/12/2014 1954   ALT 15 01/12/2014 1954   ALKPHOS 75 01/12/2014 1954   BILITOT 0.5 01/12/2014 1954   GFRNONAA >90 01/20/2014 0355   GFRAA >90 01/20/2014 0355    No results found for this basename: chol,  tri,  ldl    No components found with this basename: hga1c    Lab  Results  Component Value Date/Time   AST 19 01/12/2014  7:54 PM    Assessment and Plan  Hypertension - Plan: Advised patient for DASH diet, refill on the medications  lisinopril (PRINIVIL,ZESTRIL) 20 MG tablet, hydrochlorothiazide (HYDRODIURIL) 25 MG tablet,   MM DIGITAL SCREENING BILATERAL  Diabetes mellitus, type 2 - Plan: HgB A1c Continue with Novolin will repeat A1c on the next visit, advised patient for low carbohydrate diet   Smoking Encouraged patient to quit smoking.   Return in about 3 months (around 05/12/2014) for diabetes, hypertension.  Lorayne Marek, MD

## 2014-02-09 NOTE — Progress Notes (Signed)
Patient is here today for HTN

## 2014-02-09 NOTE — Patient Instructions (Addendum)
Diabetes Meal Planning Guide The diabetes meal planning guide is a tool to help you plan your meals and snacks. It is important for people with diabetes to manage their blood glucose (sugar) levels. Choosing the right foods and the right amounts throughout your day will help control your blood glucose. Eating right can even help you improve your blood pressure and reach or maintain a healthy weight. CARBOHYDRATE COUNTING MADE EASY When you eat carbohydrates, they turn to sugar. This raises your blood glucose level. Counting carbohydrates can help you control this level so you feel better. When you plan your meals by counting carbohydrates, you can have more flexibility in what you eat and balance your medicine with your food intake. Carbohydrate counting simply means adding up the total amount of carbohydrate grams in your meals and snacks. Try to eat about the same amount at each meal. Foods with carbohydrates are listed below. Each portion below is 1 carbohydrate serving or 15 grams of carbohydrates. Ask your dietician how many grams of carbohydrates you should eat at each meal or snack. Grains and Starches  1 slice bread.   English muffin or hotdog/hamburger bun.   cup cold cereal (unsweetened).   cup cooked pasta or rice.   cup starchy vegetables (corn, potatoes, peas, beans, winter squash).  1 tortilla (6 inches).   bagel.  1 waffle or pancake (size of a CD).   cup cooked cereal.  4 to 6 small crackers. *Whole grain is recommended. Fruit  1 cup fresh unsweetened berries, melon, papaya, pineapple.  1 small fresh fruit.   banana or mango.   cup fruit juice (4 oz unsweetened).   cup canned fruit in natural juice or water.  2 tbs dried fruit.  12 to 15 grapes or cherries. Milk and Yogurt  1 cup fat-free or 1% milk.  1 cup soy milk.  6 oz light yogurt with sugar-free sweetener.  6 oz low-fat soy yogurt.  6 oz plain yogurt. Vegetables  1 cup raw or  cup  cooked is counted as 0 carbohydrates or a "free" food.  If you eat 3 or more servings at 1 meal, count them as 1 carbohydrate serving. Other Carbohydrates   oz chips or pretzels.   cup ice cream or frozen yogurt.   cup sherbet or sorbet.  2 inch square cake, no frosting.  1 tbs honey, sugar, jam, jelly, or syrup.  2 small cookies.  3 squares of graham crackers.  3 cups popcorn.  6 crackers.  1 cup broth-based soup.  Count 1 cup casserole or other mixed foods as 2 carbohydrate servings.  Foods with less than 20 calories in a serving may be counted as 0 carbohydrates or a "free" food. You may want to purchase a book or computer software that lists the carbohydrate gram counts of different foods. In addition, the nutrition facts panel on the labels of the foods you eat are a good source of this information. The label will tell you how big the serving size is and the total number of carbohydrate grams you will be eating per serving. Divide this number by 15 to obtain the number of carbohydrate servings in a portion. Remember, 1 carbohydrate serving equals 15 grams of carbohydrate. SERVING SIZES Measuring foods and serving sizes helps you make sure you are getting the right amount of food. The list below tells how big or small some common serving sizes are.  1 oz.........4 stacked dice.  3 oz.........Deck of cards.  1 tsp........Tip   of little finger.  1 tbs........Thumb.  2 tbs........Golf ball.   cup.......Half of a fist.  1 cup........A fist. SAMPLE DIABETES MEAL PLAN Below is a sample meal plan that includes foods from the grain and starches, dairy, vegetable, fruit, and meat groups. A dietician can individualize a meal plan to fit your calorie needs and tell you the number of servings needed from each food group. However, controlling the total amount of carbohydrates in your meal or snack is more important than making sure you include all of the food groups at every  meal. You may interchange carbohydrate containing foods (dairy, starches, and fruits). The meal plan below is an example of a 2000 calorie diet using carbohydrate counting. This meal plan has 17 carbohydrate servings. Breakfast  1 cup oatmeal (2 carb servings).   cup light yogurt (1 carb serving).  1 cup blueberries (1 carb serving).   cup almonds. Snack  1 large apple (2 carb servings).  1 low-fat string cheese stick. Lunch  Chicken breast salad.  1 cup spinach.   cup chopped tomatoes.  2 oz chicken breast, sliced.  2 tbs low-fat Italian dressing.  12 whole-wheat crackers (2 carb servings).  12 to 15 grapes (1 carb serving).  1 cup low-fat milk (1 carb serving). Snack  1 cup carrots.   cup hummus (1 carb serving). Dinner  3 oz broiled salmon.  1 cup brown rice (3 carb servings). Snack  1  cups steamed broccoli (1 carb serving) drizzled with 1 tsp olive oil and lemon juice.  1 cup light pudding (2 carb servings). DIABETES MEAL PLANNING WORKSHEET Your dietician can use this worksheet to help you decide how many servings of foods and what types of foods are right for you.  BREAKFAST Food Group and Servings / Carb Servings Grain/Starches __________________________________ Dairy __________________________________________ Vegetable ______________________________________ Fruit ___________________________________________ Meat __________________________________________ Fat ____________________________________________ LUNCH Food Group and Servings / Carb Servings Grain/Starches ___________________________________ Dairy ___________________________________________ Fruit ____________________________________________ Meat ___________________________________________ Fat _____________________________________________ DINNER Food Group and Servings / Carb Servings Grain/Starches ___________________________________ Dairy  ___________________________________________ Fruit ____________________________________________ Meat ___________________________________________ Fat _____________________________________________ SNACKS Food Group and Servings / Carb Servings Grain/Starches ___________________________________ Dairy ___________________________________________ Vegetable _______________________________________ Fruit ____________________________________________ Meat ___________________________________________ Fat _____________________________________________ DAILY TOTALS Starches _________________________ Vegetable ________________________ Fruit ____________________________ Dairy ____________________________ Meat ____________________________ Fat ______________________________ Document Released: 08/06/2005 Document Revised: 02/01/2012 Document Reviewed: 06/17/2009 [employer] Patient Information 2014 [employer] DASH Diet The DASH diet stands for "Dietary Approaches to Stop Hypertension." It is a healthy eating plan that has been shown to reduce high blood pressure (hypertension) in as little as 14 days, while also possibly providing other significant health benefits. These other health benefits include reducing the risk of breast cancer after menopause and reducing the risk of type 2 diabetes, heart disease, colon cancer, and stroke. Health benefits also include weight loss and slowing kidney failure in patients with chronic kidney disease.  DIET GUIDELINES  Limit salt (sodium). Your diet should contain less than 1500 mg of sodium daily.  Limit refined or processed carbohydrates. Your diet should include mostly whole grains. Desserts and added sugars should be used sparingly.  Include small amounts of heart-healthy fats. These types of fats include nuts, oils, and tub margarine. Limit saturated and trans fats. These fats have been shown to be harmful in the body. CHOOSING FOODS  The following food groups  are based on a 2000 calorie diet. See your Registered Dietitian for individual calorie needs. Grains and Grain Products (6 to 8 servings daily)  Eat More Often:   Whole-wheat bread, brown rice, whole-grain or wheat pasta, quinoa, popcorn without added fat or salt (air popped).  Eat Less Often: White bread, white pasta, white rice, cornbread. Vegetables (4 to 5 servings daily)  Eat More Often: Fresh, frozen, and canned vegetables. Vegetables may be raw, steamed, roasted, or grilled with a minimal amount of fat.  Eat Less Often/Avoid: Creamed or fried vegetables. Vegetables in a cheese sauce. Fruit (4 to 5 servings daily)  Eat More Often: All fresh, canned (in natural juice), or frozen fruits. Dried fruits without added sugar. One hundred percent fruit juice ( cup [237 mL] daily).  Eat Less Often: Dried fruits with added sugar. Canned fruit in light or heavy syrup. Lean Meats, Fish, and Poultry (2 servings or less daily. One serving is 3 to 4 oz [85-114 g]).  Eat More Often: Ninety percent or leaner ground beef, tenderloin, sirloin. Round cuts of beef, chicken breast, turkey breast. All fish. Grill, bake, or broil your meat. Nothing should be fried.  Eat Less Often/Avoid: Fatty cuts of meat, turkey, or chicken leg, thigh, or wing. Fried cuts of meat or fish. Dairy (2 to 3 servings)  Eat More Often: Low-fat or fat-free milk, low-fat plain or light yogurt, reduced-fat or part-skim cheese.  Eat Less Often/Avoid: Milk (whole, 2%).Whole milk yogurt. Full-fat cheeses. Nuts, Seeds, and Legumes (4 to 5 servings per week)  Eat More Often: All without added salt.  Eat Less Often/Avoid: Salted nuts and seeds, canned beans with added salt. Fats and Sweets (limited)  Eat More Often: Vegetable oils, tub margarines without trans fats, sugar-free gelatin. Mayonnaise and salad dressings.  Eat Less Often/Avoid: Coconut oils, palm oils, butter, stick margarine, cream, half and half, cookies, candy,  pie. FOR MORE INFORMATION The Dash Diet Eating Plan: www.dashdiet.org Document Released: 10/29/2011 Document Revised: 02/01/2012 Document Reviewed: 10/29/2011 [employer] Patient Information 2014 [employer].  

## 2014-02-18 ENCOUNTER — Encounter (HOSPITAL_COMMUNITY): Payer: Self-pay | Admitting: Emergency Medicine

## 2014-02-18 ENCOUNTER — Emergency Department (HOSPITAL_COMMUNITY): Payer: Medicaid Other

## 2014-02-18 ENCOUNTER — Inpatient Hospital Stay (HOSPITAL_COMMUNITY)
Admission: EM | Admit: 2014-02-18 | Discharge: 2014-02-20 | DRG: 287 | Disposition: A | Discharge status: Home / Self Care | Payer: Medicaid Other | Attending: Internal Medicine | Admitting: Internal Medicine

## 2014-02-18 DIAGNOSIS — K5900 Constipation, unspecified: Secondary | ICD-10-CM | POA: Diagnosis present

## 2014-02-18 DIAGNOSIS — I472 Ventricular tachycardia, unspecified: Secondary | ICD-10-CM | POA: Diagnosis present

## 2014-02-18 DIAGNOSIS — E876 Hypokalemia: Secondary | ICD-10-CM | POA: Diagnosis present

## 2014-02-18 DIAGNOSIS — R079 Chest pain, unspecified: Secondary | ICD-10-CM | POA: Diagnosis present

## 2014-02-18 DIAGNOSIS — F17200 Nicotine dependence, unspecified, uncomplicated: Secondary | ICD-10-CM | POA: Diagnosis present

## 2014-02-18 DIAGNOSIS — R002 Palpitations: Secondary | ICD-10-CM

## 2014-02-18 DIAGNOSIS — R0609 Other forms of dyspnea: Secondary | ICD-10-CM

## 2014-02-18 DIAGNOSIS — Z79899 Other long term (current) drug therapy: Secondary | ICD-10-CM

## 2014-02-18 DIAGNOSIS — R9431 Abnormal electrocardiogram [ECG] [EKG]: Secondary | ICD-10-CM

## 2014-02-18 DIAGNOSIS — R0600 Dyspnea, unspecified: Secondary | ICD-10-CM

## 2014-02-18 DIAGNOSIS — Z794 Long term (current) use of insulin: Secondary | ICD-10-CM

## 2014-02-18 DIAGNOSIS — I428 Other cardiomyopathies: Secondary | ICD-10-CM | POA: Diagnosis present

## 2014-02-18 DIAGNOSIS — IMO0001 Reserved for inherently not codable concepts without codable children: Secondary | ICD-10-CM | POA: Diagnosis present

## 2014-02-18 DIAGNOSIS — J189 Pneumonia, unspecified organism: Secondary | ICD-10-CM

## 2014-02-18 DIAGNOSIS — A599 Trichomoniasis, unspecified: Secondary | ICD-10-CM | POA: Diagnosis present

## 2014-02-18 DIAGNOSIS — R0989 Other specified symptoms and signs involving the circulatory and respiratory systems: Secondary | ICD-10-CM

## 2014-02-18 DIAGNOSIS — I509 Heart failure, unspecified: Secondary | ICD-10-CM

## 2014-02-18 DIAGNOSIS — I4729 Other ventricular tachycardia: Secondary | ICD-10-CM | POA: Diagnosis present

## 2014-02-18 DIAGNOSIS — J449 Chronic obstructive pulmonary disease, unspecified: Secondary | ICD-10-CM | POA: Diagnosis present

## 2014-02-18 DIAGNOSIS — I5023 Acute on chronic systolic (congestive) heart failure: Principal | ICD-10-CM | POA: Diagnosis present

## 2014-02-18 DIAGNOSIS — E1165 Type 2 diabetes mellitus with hyperglycemia: Secondary | ICD-10-CM

## 2014-02-18 DIAGNOSIS — K5909 Other constipation: Secondary | ICD-10-CM

## 2014-02-18 DIAGNOSIS — J441 Chronic obstructive pulmonary disease with (acute) exacerbation: Secondary | ICD-10-CM | POA: Diagnosis present

## 2014-02-18 DIAGNOSIS — E119 Type 2 diabetes mellitus without complications: Secondary | ICD-10-CM

## 2014-02-18 DIAGNOSIS — R739 Hyperglycemia, unspecified: Secondary | ICD-10-CM

## 2014-02-18 DIAGNOSIS — Z8673 Personal history of transient ischemic attack (TIA), and cerebral infarction without residual deficits: Secondary | ICD-10-CM

## 2014-02-18 DIAGNOSIS — N390 Urinary tract infection, site not specified: Secondary | ICD-10-CM | POA: Diagnosis present

## 2014-02-18 DIAGNOSIS — I10 Essential (primary) hypertension: Secondary | ICD-10-CM | POA: Diagnosis present

## 2014-02-18 LAB — CBC
HCT: 33.3 % — ABNORMAL LOW (ref 36.0–46.0)
Hemoglobin: 11.1 g/dL — ABNORMAL LOW (ref 12.0–15.0)
MCH: 27.1 pg (ref 26.0–34.0)
MCHC: 33.3 g/dL (ref 30.0–36.0)
MCV: 81.2 fL (ref 78.0–100.0)
Platelets: 269 10*3/uL (ref 150–400)
RBC: 4.1 MIL/uL (ref 3.87–5.11)
RDW: 14.6 % (ref 11.5–15.5)
WBC: 5.4 10*3/uL (ref 4.0–10.5)

## 2014-02-18 LAB — COMPREHENSIVE METABOLIC PANEL
ALBUMIN: 3.3 g/dL — AB (ref 3.5–5.2)
ALT: 18 U/L (ref 0–35)
AST: 21 U/L (ref 0–37)
Alkaline Phosphatase: 70 U/L (ref 39–117)
BUN: 14 mg/dL (ref 6–23)
CALCIUM: 9.6 mg/dL (ref 8.4–10.5)
CO2: 29 mEq/L (ref 19–32)
Chloride: 94 mEq/L — ABNORMAL LOW (ref 96–112)
Creatinine, Ser: 0.44 mg/dL — ABNORMAL LOW (ref 0.50–1.10)
GFR calc Af Amer: >90 mL/min (ref >90)
GFR calc non Af Amer: >90 mL/min (ref >90)
Glucose, Bld: 130 mg/dL — ABNORMAL HIGH (ref 70–99)
Potassium: 4 mEq/L (ref 3.7–5.3)
SODIUM: 135 meq/L — AB (ref 137–147)
TOTAL PROTEIN: 6.5 g/dL (ref 6.0–8.3)
Total Bilirubin: 0.5 mg/dL (ref 0.3–1.2)

## 2014-02-18 LAB — HEMOGLOBIN A1C
Hgb A1c MFr Bld: 7.6 % — ABNORMAL HIGH (ref <5.7)
Mean Plasma Glucose: 171 mg/dL — ABNORMAL HIGH (ref <117)

## 2014-02-18 LAB — URINALYSIS, ROUTINE W REFLEX MICROSCOPIC
Bilirubin Urine: NEGATIVE
GLUCOSE, UA: NEGATIVE mg/dL
HGB URINE DIPSTICK: NEGATIVE
Ketones, ur: NEGATIVE mg/dL
Nitrite: NEGATIVE
PH: 7 (ref 5.0–8.0)
Protein, ur: NEGATIVE mg/dL
Specific Gravity, Urine: 1.018 (ref 1.005–1.030)
Urobilinogen, UA: 0.2 mg/dL (ref 0.0–1.0)

## 2014-02-18 LAB — GLUCOSE, CAPILLARY
GLUCOSE-CAPILLARY: 96 mg/dL (ref 70–99)
GLUCOSE-CAPILLARY: 152 mg/dL — AB (ref 70–99)
Glucose-Capillary: 162 mg/dL — ABNORMAL HIGH (ref 70–99)

## 2014-02-18 LAB — URINE MICROSCOPIC-ADD ON

## 2014-02-18 LAB — TSH: TSH: 1.032 u[IU]/mL (ref 0.350–4.500)

## 2014-02-18 LAB — LACTIC ACID, PLASMA: Lactic Acid, Venous: 1 mmol/L (ref 0.5–2.2)

## 2014-02-18 LAB — MRSA PCR SCREENING: MRSA BY PCR: POSITIVE — AB

## 2014-02-18 LAB — I-STAT TROPONIN, ED: Troponin i, poc: 0.02 ng/mL (ref 0.00–0.08)

## 2014-02-18 LAB — PRO B NATRIURETIC PEPTIDE: Pro B Natriuretic peptide (BNP): 2876 pg/mL — ABNORMAL HIGH (ref 0–125)

## 2014-02-18 LAB — TROPONIN I
Troponin I: <0.3 ng/mL (ref <0.30)
Troponin I: <0.3 ng/mL (ref <0.30)

## 2014-02-18 MED ORDER — NITROGLYCERIN 0.4 MG SL SUBL
SUBLINGUAL_TABLET | SUBLINGUAL | Status: AC
Start: 2014-02-18 — End: 2014-02-19
  Filled 2014-02-18: qty 1

## 2014-02-18 MED ORDER — DEXTROSE 5 % IV SOLN
1.0000 g | Freq: Three times a day (TID) | INTRAVENOUS | Status: DC
  Administered 2014-02-18 – 2014-02-19 (×4): 1 g via INTRAVENOUS
  Filled 2014-02-18 (×6): qty 1

## 2014-02-18 MED ORDER — ONDANSETRON HCL 4 MG PO TABS: 4.0000 mg | ORAL_TABLET | Freq: Four times a day (QID) | ORAL | Status: DC | PRN

## 2014-02-18 MED ORDER — FUROSEMIDE 10 MG/ML IJ SOLN
INTRAMUSCULAR | Status: AC
  Filled 2014-02-18: qty 4

## 2014-02-18 MED ORDER — VANCOMYCIN HCL 10 G IV SOLR
1250.0000 mg | Freq: Once | INTRAVENOUS | Status: AC
  Administered 2014-02-18: 1250 mg via INTRAVENOUS
  Filled 2014-02-18: qty 1250

## 2014-02-18 MED ORDER — ONDANSETRON HCL 4 MG/2ML IJ SOLN
4.0000 mg | Freq: Four times a day (QID) | INTRAMUSCULAR | Status: DC | PRN
  Administered 2014-02-18: 4 mg via INTRAVENOUS
  Filled 2014-02-18: qty 2

## 2014-02-18 MED ORDER — METRONIDAZOLE IVPB CUSTOM
1.0000 g | Freq: Two times a day (BID) | INTRAVENOUS | Status: AC
  Administered 2014-02-18 (×2): 1 g via INTRAVENOUS
  Filled 2014-02-18 (×2): qty 200

## 2014-02-18 MED ORDER — SODIUM CHLORIDE 0.9 % IJ SOLN
3.0000 mL | Freq: Two times a day (BID) | INTRAMUSCULAR | Status: DC
  Administered 2014-02-19 – 2014-02-20 (×2): 3 mL via INTRAVENOUS

## 2014-02-18 MED ORDER — IOHEXOL 350 MG/ML SOLN
100.0000 mL | Freq: Once | INTRAVENOUS | Status: AC | PRN
  Administered 2014-02-18: 100 mL via INTRAVENOUS

## 2014-02-18 MED ORDER — SODIUM CHLORIDE 0.9 % IV SOLN: 250.0000 mL | INTRAVENOUS | Status: DC | PRN

## 2014-02-18 MED ORDER — POLYETHYLENE GLYCOL 3350 17 G PO PACK
17.0000 g | PACK | Freq: Every day | ORAL | Status: DC | PRN
  Administered 2014-02-18 – 2014-02-20 (×2): 17 g via ORAL
  Filled 2014-02-18 (×2): qty 1

## 2014-02-18 MED ORDER — ACETAMINOPHEN 325 MG PO TABS
650.0000 mg | ORAL_TABLET | Freq: Four times a day (QID) | ORAL | Status: DC | PRN
  Administered 2014-02-18 – 2014-02-19 (×2): 650 mg via ORAL
  Filled 2014-02-18 (×2): qty 2

## 2014-02-18 MED ORDER — VANCOMYCIN HCL IN DEXTROSE 750-5 MG/150ML-% IV SOLN
750.0000 mg | Freq: Two times a day (BID) | INTRAVENOUS | Status: DC
  Administered 2014-02-19 (×2): 750 mg via INTRAVENOUS
  Filled 2014-02-18 (×4): qty 150

## 2014-02-18 MED ORDER — HEPARIN SODIUM (PORCINE) 5000 UNIT/ML IJ SOLN
5000.0000 [IU] | Freq: Three times a day (TID) | INTRAMUSCULAR | Status: DC
  Administered 2014-02-18 – 2014-02-20 (×5): 5000 [IU] via SUBCUTANEOUS
  Filled 2014-02-18 (×7): qty 1

## 2014-02-18 MED ORDER — INSULIN ASPART 100 UNIT/ML ~~LOC~~ SOLN
0.0000 [IU] | SUBCUTANEOUS | Status: DC
  Administered 2014-02-18 (×2): 2 [IU] via SUBCUTANEOUS
  Administered 2014-02-19 (×2): 1 [IU] via SUBCUTANEOUS

## 2014-02-18 MED ORDER — SODIUM CHLORIDE 0.9 % IJ SOLN: 3.0000 mL | INTRAMUSCULAR | Status: DC | PRN

## 2014-02-18 MED ORDER — ASPIRIN 81 MG PO CHEW
324.0000 mg | CHEWABLE_TABLET | Freq: Once | ORAL | Status: AC
  Administered 2014-02-18: 324 mg via ORAL
  Filled 2014-02-18: qty 4

## 2014-02-18 MED ORDER — FUROSEMIDE 10 MG/ML IJ SOLN
20.0000 mg | Freq: Two times a day (BID) | INTRAMUSCULAR | Status: DC
  Administered 2014-02-18 – 2014-02-19 (×3): 20 mg via INTRAVENOUS
  Filled 2014-02-18 (×7): qty 2

## 2014-02-18 MED ORDER — SODIUM CHLORIDE 0.9 % IJ SOLN
3.0000 mL | Freq: Two times a day (BID) | INTRAMUSCULAR | Status: DC
  Administered 2014-02-18 – 2014-02-20 (×3): 3 mL via INTRAVENOUS

## 2014-02-18 MED ORDER — FUROSEMIDE 10 MG/ML IJ SOLN
20.0000 mg | Freq: Once | INTRAMUSCULAR | Status: AC
  Administered 2014-02-18: 20 mg via INTRAVENOUS
  Filled 2014-02-18: qty 2

## 2014-02-18 NOTE — Consult Note (Signed)
CARDIOLOGY CONSULT NOTE   Patient ID: Alicia [name] MRN: [id number] DOB/AGE: [birth year] 58 y.o.  Admit date: 02/18/2014  Primary Physician   Lorayne Marek, MD Primary Cardiologist    Reason for Consultation  Chest pain   HPI: Alicia [name] is a 58 y.o. female with PMH CHF (unknown EF), DM2, HTN, CVA who presents with a chief complaint of chest pain associated with SOB and palpitations. She started noticing chest pain apprx 3 days ago. She describes the pain as a "fist in her chest." Denies radiation. She's never had a heart attack before, but notes that she did have an exercise stress test and cardiac catheterization in [place], [place] over 10 years ago. She thinks both of these tests were normal, but did follow up with a cardiologist for an "irregular heart beat.:"  Last night she also developed a cough. She endorses some chills at home, but no fever. She denies leg swelling or weight gain.  She also has some left lower quadrant abdominal pain, but no nausea, vomiting, or diarrhea. She was admitted here in February 2015 with a closed loop small bowel obstruction with hernia and strangulated small bowel, and underwent exploratory laparotomy with lysis of adhesions and small bowel resection with primary anastomosis as well as repair of the hernia. Postop, she had some ileus and issues with hypertension. She notes she has had some "rib pain" since discharge. She has also had some constipation. Her last bowel movement was 2 days ago. She is passing gas.  She smokes half a pack of cigarettes per day. Denies any alcohol or drug use, including cocaine. She moved to [place] 6 months ago.           Past Medical History  Diagnosis Date  . Diabetes mellitus, type II   . Hypertension   . H/O: CVA (cerebrovascular accident)     6 strokes, most recent on 05/10/13  . CHF (congestive heart failure)   . Stroke      Past Surgical History  Procedure Laterality Date  . Tubal  ligation    . Cardiac catheterization    . Laparotomy N/A 01/15/2014    Procedure: EXPLORATORY LAPAROTOMY SMALL BOWEL RESECTION;  Surgeon: Imogene Burn. Georgette Dover, MD;  Location: [facility];  Service: General;  Laterality: N/A;  . Lysis of adhesion N/A 01/15/2014    Procedure: LYSIS OF ADHESION;  Surgeon: Imogene Burn. Georgette Dover, MD;  Location: [facility];  Service: General;  Laterality: N/A;    Allergies  Allergen Reactions  . Asa [Aspirin] Other (See Comments)    Told not to take med-per MD  . Other Other (See Comments)    Told not to take any dairy products-per MD    I have reviewed the patient's current medications . vancomycin  750 mg Intravenous Q12H   . ceFEPime (MAXIPIME) IV    . vancomycin       Prior to Admission medications   Medication Sig Start Date End Date Taking? Authorizing Provider  hydrochlorothiazide (HYDRODIURIL) 25 MG tablet Take 1 tablet (25 mg total) by mouth daily. 02/09/14  Yes Lorayne Marek, MD  insulin NPH-regular Human (NOVOLIN 70/30) (70-30) 100 UNIT/ML injection Inject 5 Units into the skin daily with breakfast.   Yes Historical Provider, MD  lisinopril (PRINIVIL,ZESTRIL) 20 MG tablet Take 1 tablet (20 mg total) by mouth daily. 02/09/14  Yes Lorayne Marek, MD     History   Social History  . Marital Status: Widowed  Spouse Name: N/A    Number of Children: N/A  . Years of Education: N/A   Occupational History  . Not on file.   Social History Main Topics  . Smoking status: Current Every Day Smoker -- 0.50 packs/day for 25 years    Types: Cigarettes  . Smokeless tobacco: Never Used  . Alcohol Use: No  . Drug Use: No  . Sexual Activity: Yes   Other Topics Concern  . Not on file   Social History Narrative  . No narrative on file    Family Status  Relation Status Death Age  . Mother Alive   . Father Deceased    Family History  Problem Relation Age of Onset  . Diabetes Mellitus II Mother   . Hypertension Mother   . CVA Mother      ROS:  Full 14 point  review of systems complete and found to be negative unless listed above.  Physical Exam: Blood pressure 162/93, pulse 80, temperature 97.8 F (36.6 C), temperature source Oral, resp. rate 21, height 5\' 6"  (1.676 m), weight 136 lb (61.689 kg), SpO2 92.00%.  General: Well developed, mal nourished, female who appears upset Head: Eyes PERRLA, No xanthomas.   Normocephalic and atraumatic, oropharynx without edema or exudate. Dentition:  Lungs: mild wheezing, crackles at bases Heart: HRRR S1 S2, no rub/gallop, Heart irregular rate and rhythm with S1, S2  murmur. pulses are 2+ extrem.   Neck: No carotid bruits. No lymphadenopathy. No  JVD. Abdomen: Tenderness with palpation, worse in LLQ. Well-healed midline surgical incision from exploratory laparotomy. Msk:  No spine or cva tenderness. No weakness, no joint deformities or effusions. Extremities: No clubbing or cyanosis. No  edema.  Neuro: Alert and oriented X 3. No focal deficits noted. Psych:  Good affect, responds appropriately Skin: No rashes or lesions noted.  Labs:   Lab Results  Component Value Date   WBC 5.4 02/18/2014   HGB 11.1* 02/18/2014   HCT 33.3* 02/18/2014   MCV 81.2 02/18/2014   PLT 269 02/18/2014     Recent Labs Lab 02/18/14 0649  NA 135*  K 4.0  CL 94*  CO2 29  BUN 14  CREATININE 0.44*  CALCIUM 9.6  PROT 6.5  BILITOT 0.5  ALKPHOS 70  ALT 18  AST 21  GLUCOSE 130*  ALBUMIN 3.3*   Magnesium  Date Value Ref Range Status  01/18/2014 1.7  1.5 - 2.5 mg/dL Final     Recent Labs  02/18/14 0658  TROPIPOC 0.02   Pro B Natriuretic peptide (BNP)  Date/Time Value Ref Range Status  02/18/2014  6:48 AM 2876.0* 0 - 125 pg/mL Final    Echo: pending  ECG:  Sinus rhythm Short PR interval, LVH Mild ST elevation anterior and ST changes inferior versus previous.  Radiology:  Ct Angio Chest Pe W/cm &/or Wo Cm  02/18/2014   CLINICAL DATA:  Shortness of breath, chest pain.  EXAM: CT ANGIOGRAPHY CHEST WITH CONTRAST   TECHNIQUE: Multidetector CT imaging of the chest was performed using the standard protocol during bolus administration of intravenous contrast. Multiplanar CT image reconstructions and MIPs were obtained to evaluate the vascular anatomy.  CONTRAST:  174mL OMNIPAQUE IOHEXOL 350 MG/ML SOLN  COMPARISON:  DG CHEST 1V PORT dated 02/18/2014  FINDINGS: There is cardiomegaly. Coronary artery calcifications in all 3 major coronary vessels. There are moderate bilateral pleural effusions. Airspace opacities in the lingula, right middle lobe and both lung bases likely reflect atelectasis. Scattered ground-glass opacities throughout  the lungs likely reflect early interstitial edema. Mild vascular congestion noted.  Amorphous soft tissue density throughout the mediastinum without measurable lymph nodes. This may represent scratch head this likely is related to congestion.  No filling defects in the pulmonary arteries to suggest pulmonary emboli. Chest wall soft tissues are unremarkable. Imaging into the upper abdomen shows no acute findings. Degenerative spurring in the mid and lower thoracic spine.  Review of the MIP images confirms the above findings.  IMPRESSION: Cardiomegaly. Moderate bilateral pleural effusions. Suspect early interstitial edema.  Patchy opacities at the lung bases involving the lower lobes, right middle lobe and lingula. Favor atelectasis although infection cannot be completely excluded.  No evidence of pulmonary embolus.    Dg Chest Port 1 View  02/18/2014   CLINICAL DATA:  Mid chest pain. History of diabetes and hypertension.  EXAM: PORTABLE CHEST - 1 VIEW  COMPARISON:  None available for comparison at time of study interpretation.  FINDINGS: The cardiac silhouette appears mildly enlarged, mediastinal silhouette is nonsuspicious. Mild central pulmonary vasculature congestion with left greater than right lung base patchy alveolar airspace opacities. Small bilateral pleural effusions. Trachea projects  midline and there is no pneumothorax.  Multiple EKG lines overlie the patient and may obscure subtle underlying pathology. Moderate degenerative change of the thoracic spine.  IMPRESSION: Mild cardiomegaly and central pulmonary vasculature congestion. Superimposed bibasilar airspace opacities with small pleural effusions, findings may reflect confluent edema or superimposed pneumonia. Recommend followup chest radiograph after treatment to verify improvement.   ASSESSMENT AND PLAN:    Active Problems:   Dyspnea  Chest pain 3 days of intermittent left-sided chest pain, nonradiating, associated with shortness of breath, worse when lying down at night. Patient does have significant cardiac risk factors including DM2, HTN, CHF, tobacco abuse. --Troponin POC negative, ECG with no acute ST or TW changes; however, some ST changes in anterior leads when compared to previous tracings. No active chest pain. 5/10 chest pain completely relieved with 2 SL NTG. --Cycle Troponin and serial ECGs --Will admit to telemtry for observation. Will likely need a cardiac cath in the AM, make NPO after midnight and pre cath orders put in  CHF- elevated BNP ~2K, given one dose IV Lasix 20mg . Feeling much better with diuresis -- CXR with cardiomegaly and central pulmonary vasculature congestion. Superimposed bibasilar airspace opacities with small pleural effusions -- strict I/0s and daily weights -- ECHO pending.  -- CTA was negative for pulmonary embolism  HTN- mildly elevated today. Cont HCTZ 25 mg & lisinopril 20 mg  Possible HCAP-  She is with cough and there are opacities/effusions on CT, she was recently hospitalized in Feb for SBO s/p resection. Cont IV abx per internal medicine   Abdominal pain- pending lactic acid. Recent SBO   UTI with trichomonas- continue ABx per IM  Signed: Perry Mount, PA-C 02/18/2014 9:53 AM  Pager [phone]   Personally seen and examined, agree with above. Symptoms of "fist in  her chest "as well as EKG changes are concerning for possible underlying coronary artery disease/ischemia. I would like for her to proceed with heart catheterization. Risks and benefits including stroke, heart attack, death, renal impairment, bleeding have been discussed.   Echocardiogram from outside hospital demonstrated EF of 35-40%. It does not sound as though she has had a cardiac catheterization since discovery of possible ischemic cardiomyopathy.  BNP was elevated at 2800, troponin thus far is negative. Creatinine is 0.4.  She has had recent small bowel obstruction lysis of adhesions  in February. No further upcoming surgeries.  Outside hospital records provided by Dr. Lucila Maine:  [facility]:  11/21/2004 - Patient presented to the emergency room in [place] with chest pain and significant EKG changes, troponin was elevated to 0.9, chest pain was nitroglycerin sensitive, after extensive counseling by cardiologist and emergency room physicians, she left [place] prior to cardiac catheterization.   12/24/04 - Patient came back to the emergency room with dyspnea on exertion and was agreeable to cath this time. She underwent left heart catheterization, left ventriculogram, coronary angiography, bilateral selective renal angiography, RFA angiography.  Impression:  1. Cardiomyopathy with severe LV dysfunction - LVEF 25% with dilated LV cavity, severe global hypokinesis, very focal akinesis of mid inferior wall  2. Single-vessel CAD with distal OM 2 stenosis (80%), likely culprit for acute coronary syndrome several weeks ago, this also correlates with wall motion abnormality  3. Severe hypertension  4. No significant renal artery stenosis  5. No mitral regurgitation or aortic stenosis  Report also notes 40% distal luminal irregularities of the RCA. RFA has 30-40% disease in it, there is an anatomical variant with a large vessel coming off  superior to the bifurcation of the RFA giving off an inferior epigastric as well as a profunda, no Perclose was performed because of unusual anatomy.   It does not appear that any intervention was done (stent, etc). Recommendations were to aggressively treat blood pressure, continue medical therapy of coronary artery disease and nonischemic cardiomyopathy.   07/23/2009 - Nuclear Lexiscan stress test negative, no changes consistent with ischemia. (Reason for test was chest pain.)   05/11/2013 - 2-D echocardiogram shows moderate concentric left hypertrophy, moderately reduced left ventricular systolic function with an EF of 30-40%.   Dr. Tresa Endo was her primary care doctor in [place]  Dr. Doreene Nest did her cath.  Lesly Dukes, MD  Candee Furbish, MD

## 2014-02-18 NOTE — Progress Notes (Signed)
ANTIBIOTIC CONSULT NOTE - INITIAL  Pharmacy Consult for vancomycin and cefepime Indication: HCAP  Allergies  Allergen Reactions  . Asa [Aspirin] Other (See Comments)    Told not to take med-per MD  . Other Other (See Comments)    Told not to take any dairy products-per MD    Patient Measurements: Height: 5\' 6"  (167.6 cm) Weight: 136 lb (61.689 kg) IBW/kg (Calculated) : 59.3  Vital Signs: Temp: 97.8 F (36.6 C) (03/29 0626) Temp src: Oral (03/29 0626) BP: 162/93 mmHg (03/29 0739) Pulse Rate: 80 (03/29 0739) Intake/Output from previous day:   Intake/Output from this shift:    Labs:  Recent Labs  02/18/14 0649  WBC 5.4  HGB 11.1*  PLT 269  CREATININE 0.44*   Estimated Creatinine Clearance: 73.5 ml/min (by C-G formula based on Cr of 0.44). No results found for this basename: VANCOTROUGH, VANCOPEAK, VANCORANDOM, GENTTROUGH, GENTPEAK, GENTRANDOM, TOBRATROUGH, TOBRAPEAK, TOBRARND, AMIKACINPEAK, AMIKACINTROU, AMIKACIN,  in the last 72 hours   Microbiology: No results found for this or any previous visit (from the past 720 hour(s)).  Medical History: Past Medical History  Diagnosis Date  . Diabetes mellitus, type II   . Hypertension   . H/O: CVA (cerebrovascular accident)     6 strokes, most recent on 05/10/13  . CHF (congestive heart failure)   . Stroke     Medications:  See PTA medication list  Assessment: 58 y/o female who presents to the ED with intermittent chest pain for 3 days. CP work-up in progress. She had a small bowel resection for an obstruction on 2/23. Pharmacy consulted to begin vancomycin and cefepime for HCAP coverage. CXR with possible PNA. CT angio chest favors atelectasis although infection cannot be completely excluded, neg for PE. WBC are normal, patient is afebrile, and renal function is normal.  Goal of Therapy:  Vancomycin trough level 15-20 mcg/ml  Plan:  - Vancomycin 1250 mg IV x1 then 750 mg IV q12h - Cefepime 1 g IV q8h - Monitor  renal function and clinical course  [facility], Pharm.D., BCPS Clinical Pharmacist Pager: [phone] 02/18/2014 9:05 AM

## 2014-02-18 NOTE — H&P (Signed)
Date: 02/18/2014               Patient Name:  Alicia [name] MRN: [id number]  DOB: 1956-05-05 Age / Sex: 58 y.o., female   PCP: Lorayne Marek, MD         Medical Service: Internal Medicine Teaching Service         Attending Physician: Dr. Dominic Pea, DO    First Contact: Dr. Lesly Dukes Pager: [phone]  Second Contact: Dr. Randell Loop Pager: [phone]       After Hours (After 5p/  First Contact Pager: [phone]  weekends / holidays): Second Contact Pager: [phone]   Chief Complaint: Chest pain  History of Present Illness:  Alicia [name] is a 58 y.o. female with PMH CHF (unknown EF), DM2, HTN, CVA who presents with a chief complaint of chest pain.  Patient reports she developed intermittent left-sided chest pain about 3 days ago. It is worse at night when she is lying down in bed, and associated with shortness of breath and a sensation of "fluttering". She describes the pain as a "fist in her chest." Denies radiation. She's never had a heart attack before, but notes that she did have an exercise stress test and cardiac catheterization in [place], [place] over 10 years ago. She thinks both of these tests were normal. Last night she also developed a cough. She endorses some chills at home, but no fever. She denies leg swelling or weight gain. She has not taken anything for the pain. She does not take a daily aspirin (was told to stop taking it), and does not have nitroglycerin glycerin at home.  She also has some left lower quadrant abdominal pain, but no nausea, vomiting, or diarrhea. She was admitted here in February 2015 with a closed loop small bowel obstruction with hernia and strangulated small bowel, and underwent exploratory laparotomy with lysis of adhesions and small bowel resection with primary anastomosis as well as repair of the hernia. Postop, she had some ileus and issues with hypertension. She notes she has had some "rib pain" since discharge. She has also had some  constipation. Her last bowel movement was 2 days ago. She is passing gas.  She smokes half a pack of cigarettes per day. Denies any alcohol or drug use, including cocaine. She moved to [place] 6 months ago.  In the ED, she was given aspirin 324 mg by mouth, Lasix 20 mg IV, vancomycin and cefepime IV.   Meds: Current Facility-Administered Medications  Medication Dose Route Frequency Provider Last Rate Last Dose  . ceFEPIme (MAXIPIME) 1 g in dextrose 5 % 50 mL IVPB  1 g Intravenous [address] [place], [place]      . vancomycin (VANCOCIN) 1,250 mg in sodium chloride 0.9 % 250 mL IVPB  1,250 mg Intravenous Once [facility], [facility]      . vancomycin (VANCOCIN) IVPB 750 mg/150 ml premix  750 mg Intravenous Q12H [place], [facility]       Current Outpatient Prescriptions  Medication Sig Dispense Refill  . hydrochlorothiazide (HYDRODIURIL) 25 MG tablet Take 1 tablet (25 mg total) by mouth daily.  30 tablet  3  . insulin NPH-regular Human (NOVOLIN 70/30) (70-30) 100 UNIT/ML injection Inject 5 Units into the skin daily with breakfast.      . lisinopril (PRINIVIL,ZESTRIL) 20 MG tablet Take 1 tablet (20 mg total) by mouth daily.  30 tablet  3    Allergies: Allergies as of 02/18/2014 - Review  Complete 02/18/2014  Allergen Reaction Noted  . Asa [aspirin] Other (See Comments) 01/12/2014  . Other Other (See Comments) 01/12/2014   Past Medical History  Diagnosis Date  . Diabetes mellitus, type II   . Hypertension   . H/O: CVA (cerebrovascular accident)     6 strokes, most recent on 05/10/13  . CHF (congestive heart failure)   . Stroke    Past Surgical History  Procedure Laterality Date  . Tubal ligation    . Cardiac catheterization    . Laparotomy N/A 01/15/2014    Procedure: EXPLORATORY LAPAROTOMY SMALL BOWEL RESECTION;  Surgeon: Imogene Burn. Georgette Dover, MD;  Location: [facility];  Service: General;  Laterality: N/A;  . Lysis of adhesion N/A 01/15/2014    Procedure: LYSIS OF  ADHESION;  Surgeon: Imogene Burn. Georgette Dover, MD;  Location: [facility];  Service: General;  Laterality: N/A;   Family History  Problem Relation Age of Onset  . Diabetes Mellitus II Mother   . Hypertension Mother   . CVA Mother    History   Social History  . Marital Status: Widowed    Spouse Name: N/A    Number of Children: N/A  . Years of Education: N/A   Occupational History  . Not on file.   Social History Main Topics  . Smoking status: Current Every Day Smoker -- 0.50 packs/day for 25 years    Types: Cigarettes  . Smokeless tobacco: Never Used  . Alcohol Use: No  . Drug Use: No  . Sexual Activity: Yes   Other Topics Concern  . Not on file   Social History Narrative  . No narrative on file    Review of Systems: Pertinent items are noted in HPI.  Physical Exam: Blood pressure 162/93, pulse 80, temperature 97.8 F (36.6 C), temperature source Oral, resp. rate 21, height 5\' 6"  (1.676 m), weight 136 lb (61.689 kg), SpO2 92.00%. Physical Exam  Constitutional: She is oriented to person, place, and time and well-developed, well-nourished, and in no distress.  Pleasant, conversant.  HENT:  Head: Normocephalic and atraumatic.  Eyes: Conjunctivae and EOM are normal. Pupils are equal, round, and reactive to light.  Neck: Normal range of motion. Neck supple. No JVD present.  Cardiovascular: Normal rate, regular rhythm, normal heart sounds and intact distal pulses.  Exam reveals no gallop and no friction rub.   No murmur heard. Pulmonary/Chest: Effort normal. No respiratory distress. She has no wheezes. She has rales (Crackles at bases bilaterally). She exhibits no tenderness.  Abdominal: Soft. She exhibits no distension and no mass. There is tenderness (Mildly tender to palpation in LLQ). There is no rebound and no guarding.  Well-healed midline surgical incision from exploratory laparotomy. Bowel sounds normo-active in RLQ, hypoactive in LLQ.  Musculoskeletal: Normal range of motion.  She exhibits no edema and no tenderness.  Neurological: She is alert and oriented to person, place, and time. No cranial nerve deficit. GCS score is 15.  Skin: Skin is warm and dry. No rash noted. She is not diaphoretic. No erythema. No pallor.  Psychiatric: Affect normal.     Lab results: Basic Metabolic Panel:  Recent Labs  02/18/14 0649  NA 135*  K 4.0  CL 94*  CO2 29  GLUCOSE 130*  BUN 14  CREATININE 0.44*  CALCIUM 9.6   Liver Function Tests:  Recent Labs  02/18/14 0649  AST 21  ALT 18  ALKPHOS 70  BILITOT 0.5  PROT 6.5  ALBUMIN 3.3*   No results found  for this basename: LIPASE, AMYLASE,  in the last 72 hours No results found for this basename: AMMONIA,  in the last 72 hours CBC:  Recent Labs  02/18/14 0649  WBC 5.4  HGB 11.1*  HCT 33.3*  MCV 81.2  PLT 269   Cardiac Enzymes: No results found for this basename: CKTOTAL, CKMB, CKMBINDEX, TROPONINI,  in the last 72 hours BNP:  Recent Labs  02/18/14 0648  PROBNP 2876.0*   D-Dimer: No results found for this basename: DDIMER,  in the last 72 hours CBG: No results found for this basename: GLUCAP,  in the last 72 hours Hemoglobin A1C: No results found for this basename: HGBA1C,  in the last 72 hours Fasting Lipid Panel: No results found for this basename: CHOL, HDL, LDLCALC, TRIG, CHOLHDL, LDLDIRECT,  in the last 72 hours Thyroid Function Tests: No results found for this basename: TSH, T4TOTAL, FREET4, T3FREE, THYROIDAB,  in the last 72 hours Anemia Panel: No results found for this basename: VITAMINB12, FOLATE, FERRITIN, TIBC, IRON, RETICCTPCT,  in the last 72 hours Coagulation: No results found for this basename: LABPROT, INR,  in the last 72 hours Urine Drug Screen: Drugs of Abuse  No results found for this basename: labopia,  cocainscrnur,  labbenz,  amphetmu,  thcu,  labbarb    Alcohol Level: No results found for this basename: ETH,  in the last 72 hours Urinalysis:  Recent Labs   02/18/14 0715  COLORURINE YELLOW  LABSPEC 1.018  PHURINE 7.0  GLUCOSEU NEGATIVE  HGBUR NEGATIVE  BILIRUBINUR NEGATIVE  KETONESUR NEGATIVE  PROTEINUR NEGATIVE  UROBILINOGEN 0.2  NITRITE NEGATIVE  LEUKOCYTESUR LARGE*    Imaging results:  Ct Angio Chest Pe W/cm &/or Wo Cm  02/18/2014   CLINICAL DATA:  Shortness of breath, chest pain.  EXAM: CT ANGIOGRAPHY CHEST WITH CONTRAST  TECHNIQUE: Multidetector CT imaging of the chest was performed using the standard protocol during bolus administration of intravenous contrast. Multiplanar CT image reconstructions and MIPs were obtained to evaluate the vascular anatomy.  CONTRAST:  OMNIPAQUE IOHEXOL 350 MG/ML SOLN  COMPARISON:  DG CHEST 1V PORT dated 02/18/2014  FINDINGS: There is cardiomegaly. Coronary artery calcifications in all 3 major coronary vessels. There are moderate bilateral pleural effusions. Airspace opacities in the lingula, right middle lobe and both lung bases likely reflect atelectasis. Scattered ground-glass opacities throughout the lungs likely reflect early interstitial edema. Mild vascular congestion noted.  Amorphous soft tissue density throughout the mediastinum without measurable lymph nodes. This may represent scratch head this likely is related to congestion.  No filling defects in the pulmonary arteries to suggest pulmonary emboli. Chest wall soft tissues are unremarkable. Imaging into the upper abdomen shows no acute findings. Degenerative spurring in the mid and lower thoracic spine.  Review of the MIP images confirms the above findings.  IMPRESSION: Cardiomegaly. Moderate bilateral pleural effusions. Suspect early interstitial edema.  Patchy opacities at the lung bases involving the lower lobes, right middle lobe and lingula. Favor atelectasis although infection cannot be completely excluded.  No evidence of pulmonary embolus.   Electronically Signed   By: Charlett Nose M.D.   On: 02/18/2014 08:24   Dg Chest Port 1  View  02/18/2014   CLINICAL DATA:  Mid chest pain. History of diabetes and hypertension.  EXAM: PORTABLE CHEST - 1 VIEW  COMPARISON:  None available for comparison at time of study interpretation.  FINDINGS: The cardiac silhouette appears mildly enlarged, mediastinal silhouette is nonsuspicious. Mild central pulmonary vasculature congestion with  left greater than right lung base patchy alveolar airspace opacities. Small bilateral pleural effusions. Trachea projects midline and there is no pneumothorax.  Multiple EKG lines overlie the patient and may obscure subtle underlying pathology. Moderate degenerative change of the thoracic spine.  IMPRESSION: Mild cardiomegaly and central pulmonary vasculature congestion. Superimposed bibasilar airspace opacities with small pleural effusions, findings may reflect confluent edema or superimposed pneumonia. Recommend followup chest radiograph after treatment to verify improvement.   Electronically Signed   By: Elon Alas   On: 02/18/2014 06:47    Other results: EKG: Normal sinus rhythm, rate 82, left axis deviation, left ventricular hypertrophy, 1 mm ST elevation versus J-point elevation in V1 and V2, T-wave inversions/flattening in the inferior leads and lateral precordial leads, latter findings were present on prior EKG dated 01/12/2014.  Assessment & Plan by Problem: Tatyanna Cronk Paulino is a 57 y.o. female with PMH CHF (unknown EF), DM2, HTN, CVA who presents with a chief complaint of chest pain.  #Atypical chest pain, rule out MI - Patient presented with 3 days of intermittent left-sided chest pain, nonradiating, associated with shortness of breath, worse when lying down at night. CTA was negative for pulmonary embolism, but showed moderate bilateral pleural effusions and patchy opacities at the lung bases. EKG showed 1 mm ST elevation versus J-point elevation in V1 and V2, T-wave inversions/flattening in the inferior leads and lateral precordial leads, latter  findings were present on prior EKG dated 01/12/2014. Point-of-care troponin negative. Risk factors for ACS include DM2, HTN, CHF, tobacco abuse. TIMI score = 2, 8% risk. Differential includes ACS, CHF exacerbation (pro-BNP elevated at 2876, unknown baseline, edema/effusions on CT), HCAP (cough, opacities/effusions on CT, recent hospitalization). Cardiology has been consulted. - Admit to IMTS, telemetry - Consult to cardiology placed, Dr. Marlou Porch - Obtain [facility] from [facility] (2D echo, cath, stress test) - Repeat EKG now and in am - Trending troponins - She was "told not to take ASA" (perhaps by surgery?), tolerated 325 in the ED, obtaining records from OSH prior to resuming daily apsirin - 2-D echo - Lasix 20 mg IV twice a day - Daily weights - Strict ins and outs - TSH - Vancomycin and cefepime and IV - Oxygen therapy to keep O2 sats greater than 90% - NPO until cardiology sees her in case they want to cath - Zofran when necessary for nausea - BMP, CBC in am  #Mild LLQ abdominal pain - Patient is status post exploratory laparotomy with lysis of adhesions and small bowel resection with primary anastomosis as well as repair of a hernia in 02/15. She is passing gas, but has not had a bowel movement in 2 days. Bowel sounds were normoactive except in the left lower quadrant, where they were hypoactive. Discomfort is likely secondary to constipation. We will provide a bowel regimen and continue to monitor closely. - MiraLAX 17 g daily - Lactic acid - Incentive spirometry  #DM2 - Last A1C 9.1 in 11/14. Home meds include Novolin 70/30 5 units daily with breakfast. - Hemoglobin A1c - Sliding scale insulin, sensitive - CBG monitoring q4h while NPO  #HTN - Blood pressure is elevated at 150-160s/90s. At home she takes HCTZ 25 mg daily and lisinopril 20 mg daily. - Lasix as above - Holding home meds until cardiology can see her  #Trichomonas - Noted on urinalysis. - Flagyl 1 g  IV x2 today  #DVT PPX - Heparin subcutaneous   Dispo: Disposition is deferred at this time, awaiting improvement  of current medical problems. Anticipated discharge in approximately 1-3 day(s).   The patient does have a current PCP (Lorayne Marek, MD) and does not need an [facility] hospital follow-up appointment after discharge.  The patient does not have transportation limitations that hinder transportation to clinic appointments.  Signed: Lesly Dukes, MD 02/18/2014, 9:39 AM   Lesly Dukes, MD  [email or web address] Pager # [phone] Office # [phone]

## 2014-02-18 NOTE — ED Provider Notes (Signed)
CSN: [id number]     Arrival date & time 02/18/14  0608 History   First MD Initiated Contact with Patient 02/18/14 [phone]     Chief Complaint  Patient presents with  . Chest Pain     (Consider location/radiation/quality/duration/timing/severity/associated sxs/prior Treatment) The history is provided by the patient.    Pt with hx CHF, HTN, DM, and recent abdominal surgery (01/15/14) p/w chest pain, SOB x 3 nights.  Pain is over left anterior chest, described as "tense, balled up fist," lasts a few minutes at a time and resolves.  Has SOB and orthopnea, only at night.  She is able to lie down and nap during the day without difficulty.  Tonight she took nitroglycerin with complete relief of her pain.  Had mild sore throat 2 days ago that resolved.  Otherwise denies URI symptoms.  Denies fevers.  Only mild dry cough.  Denies leg swelling.  Abdominal surgery went well, healing appropriately.  Denies pain, N/V/D, constipation.  Does have tingling with urination that started a few days ago.    Past Medical History  Diagnosis Date  . Diabetes mellitus, type II   . Hypertension   . H/O: CVA (cerebrovascular accident)     6 strokes, most recent on 05/10/13  . CHF (congestive heart failure)   . Stroke    Past Surgical History  Procedure Laterality Date  . Tubal ligation    . Cardiac catheterization    . Laparotomy N/A 01/15/2014    Procedure: EXPLORATORY LAPAROTOMY SMALL BOWEL RESECTION;  Surgeon: Imogene Burn. Georgette Dover, MD;  Location: [facility];  Service: General;  Laterality: N/A;  . Lysis of adhesion N/A 01/15/2014    Procedure: LYSIS OF ADHESION;  Surgeon: Imogene Burn. Georgette Dover, MD;  Location: [facility];  Service: General;  Laterality: N/A;   Family History  Problem Relation Age of Onset  . Diabetes Mellitus II Mother   . Hypertension Mother   . CVA Mother    History  Substance Use Topics  . Smoking status: Current Every Day Smoker -- 0.50 packs/day for 25 years    Types: Cigarettes  . Smokeless tobacco:  Never Used  . Alcohol Use: No   OB History   Grav Para Term Preterm Abortions TAB SAB Ect Mult Living                 Review of Systems  Constitutional: Negative for fever, chills, activity change and appetite change.  Respiratory: Positive for shortness of breath.   Cardiovascular: Positive for chest pain.  Gastrointestinal: Negative for nausea, vomiting, abdominal pain and diarrhea.  Genitourinary: Positive for dysuria. Negative for urgency and frequency.  All other systems reviewed and are negative.      Allergies  Asa and Other  Home Medications   Current Outpatient Rx  Name  Route  Sig  Dispense  Refill  . hydrochlorothiazide (HYDRODIURIL) 25 MG tablet   Oral   Take 1 tablet (25 mg total) by mouth daily.   30 tablet   3   . insulin NPH-regular Human (NOVOLIN 70/30) (70-30) 100 UNIT/ML injection   Subcutaneous   Inject 5 Units into the skin daily with breakfast.         . lisinopril (PRINIVIL,ZESTRIL) 20 MG tablet   Oral   Take 1 tablet (20 mg total) by mouth daily.   30 tablet   3    BP 152/101  Pulse 80  Temp(Src) 97.8 F (36.6 C) (Oral)  Resp 14  Ht 5\' 6"  (  1.676 m)  Wt 136 lb (61.689 kg)  BMI 21.96 kg/m2  SpO2 97% Physical Exam  Nursing note and vitals reviewed. Constitutional: She appears well-developed and well-nourished. No distress.  HENT:  Head: Normocephalic and atraumatic.  Neck: Neck supple.  Cardiovascular: Normal rate and regular rhythm.   Pulmonary/Chest: Effort normal. No accessory muscle usage. Not tachypneic. No respiratory distress. She has decreased breath sounds. She has no wheezes.  Breath sounds decreased at bases  Abdominal: Soft. She exhibits no distension. There is no tenderness. There is no rebound and no guarding.  Healing midline surgical incision  Musculoskeletal: She exhibits no edema.  Neurological: She is alert. She exhibits normal muscle tone.  Skin: She is not diaphoretic.  Psychiatric: She has a normal mood  and affect. Her behavior is normal.    ED Course  Procedures (including critical care time) Labs Review Labs Reviewed  CBC - Abnormal; Notable for the following:    Hemoglobin 11.1 (*)    HCT 33.3 (*)    All other components within normal limits  COMPREHENSIVE METABOLIC PANEL - Abnormal; Notable for the following:    Sodium 135 (*)    Chloride 94 (*)    Glucose, Bld 130 (*)    Creatinine, Ser 0.44 (*)    Albumin 3.3 (*)    All other components within normal limits  PRO B NATRIURETIC PEPTIDE - Abnormal; Notable for the following:    Pro B Natriuretic peptide (BNP) 2876.0 (*)    All other components within normal limits  URINALYSIS, ROUTINE W REFLEX MICROSCOPIC  I-STAT TROPOININ, ED   Imaging Review Dg Chest Port 1 View  02/18/2014   CLINICAL DATA:  Mid chest pain. History of diabetes and hypertension.  EXAM: PORTABLE CHEST - 1 VIEW  COMPARISON:  None available for comparison at time of study interpretation.  FINDINGS: The cardiac silhouette appears mildly enlarged, mediastinal silhouette is nonsuspicious. Mild central pulmonary vasculature congestion with left greater than right lung base patchy alveolar airspace opacities. Small bilateral pleural effusions. Trachea projects midline and there is no pneumothorax.  Multiple EKG lines overlie the patient and may obscure subtle underlying pathology. Moderate degenerative change of the thoracic spine.  IMPRESSION: Mild cardiomegaly and central pulmonary vasculature congestion. Superimposed bibasilar airspace opacities with small pleural effusions, findings may reflect confluent edema or superimposed pneumonia. Recommend followup chest radiograph after treatment to verify improvement.   Electronically Signed   By: Elon Alas   On: 02/18/2014 06:47     EKG Interpretation   Date/Time:  Sunday February 18 2014 06:12:29 EDT Ventricular Rate:  81 PR Interval:  107 QRS Duration: 92 QT Interval:  404 QTC Calculation: 469 R Axis:    -40 Text Interpretation:  Sinus rhythm Short PR interval Left ventricular  hypertrophy Mild ST elevation anterior and ST changes inferior versus  previous.  Lateral leads are also involved Baseline wander in lead(s) V2  Changes compared to previous Confirmed by ZAVITZ  MD, JOSHUA ([id number]) on  02/18/2014 7:29:23 AM      6:52 AM Discussed pt with Dr Reather Converse  8:53 AM Decreased O2 saturation while lying flat.  To mid 53s on room air.  Pt placed on nasal canula.  She states she is starting to cough more.  CT chest shows patchy opacities of the lower lobes, possible early pneumonia as well as CHF exacerbation.  Admitted to [facility] Internal Medicine Teaching Service.    MDM   Final diagnoses:  Dyspnea  CHF exacerbation  EKG,  abnormal  HCAP (healthcare-associated pneumonia)    Pt with hx CHF, HTN, DM, recent surgery p/w intermittent CP and SOB over 3 days.  EKG with changes, mild ST elevations.  Reviewed EKG with Dr Reather Converse.  Troponin negative.  BNP elevated.  CT angio chest shows no PE but does show some patchy opacities atelectasis vs pneumonia - given SOB, cough, O2 sat dropping, will treat for pneumonia.  8 day hospital admission last month - treat with vanc and cefepime per pharmacy.  Admitted to unassigned medicine, [facility] teaching service.      Clayton Bibles, PA-C 02/18/14 1553

## 2014-02-18 NOTE — Progress Notes (Signed)
Summary of outside hospital cardiac history for consulting cardiologist:  [facility]:  11/21/2004 - Patient presented to the emergency room in [place] with chest pain and significant EKG changes, troponin was elevated to 0.9, chest pain was nitroglycerin sensitive, after extensive counseling by cardiologist and emergency room physicians, she left [place] prior to cardiac catheterization.   12/24/04 - Patient came back to the emergency room with dyspnea on exertion and was agreeable to cath this time. She underwent left heart catheterization, left ventriculogram, coronary angiography, bilateral selective renal angiography, RFA angiography.  Impression:  1. Cardiomyopathy with severe LV dysfunction - LVEF 25% with dilated LV cavity, severe global hypokinesis, very focal akinesis of mid inferior wall 2. Single-vessel CAD with distal OM 2 stenosis (80%), likely culprit for acute coronary syndrome several weeks ago, this also correlates with wall motion abnormality 3. Severe hypertension 4. No significant renal artery stenosis 5. No mitral regurgitation or aortic stenosis  Report also notes 40% distal luminal irregularities of the RCA. RFA has 30-40% disease in it, there is an anatomical variant with a large vessel coming off superior to the bifurcation of the RFA giving off an inferior epigastric as well as a profunda, no Perclose was performed because of unusual anatomy.  It does not appear that any intervention was done (stent, etc). Recommendations were to aggressively treat blood pressure, continue medical therapy of coronary artery disease and nonischemic cardiomyopathy.  07/23/2009 - Nuclear Lexiscan stress test negative, no changes consistent with ischemia. (Reason for test was chest pain.)  05/11/2013 - 2-D echocardiogram shows moderate concentric left hypertrophy, moderately reduced left ventricular systolic function with an EF of  30-40%.   Dr. Tresa Endo was her primary care doctor in [place] Dr. Doreene Nest did her cath.   Lesly Dukes, MD  [email or web address] Pager # [phone] Office # [phone]

## 2014-02-18 NOTE — ED Notes (Signed)
Pt presents to the ED from home with c/o of chest pain that has been intermittent for the past 3 days.  Pt states she called EMS this am because she was awoken at 1:00am with intense pressure in a pinpoint location in the left chest.  Pt denies radiation to either left or right arm, only SOB.  Pt has had recent bowel surgery in March.  Pt took 2 SL nitro this am with relief of pain 3/10 with EMS.  Pt with EMS had course rales in the upper lobes and rhonci in the lower lobes.  Pt in the ED, pt rating the pain 6/10.

## 2014-02-18 NOTE — Progress Notes (Signed)
Pt called with c/o chest pain. Ekg done but no nitro given. Pain resolved without intervention.

## 2014-02-19 ENCOUNTER — Ambulatory Visit: Payer: Medicaid Other | Admitting: Internal Medicine

## 2014-02-19 ENCOUNTER — Encounter (HOSPITAL_COMMUNITY)
Admission: EM | Disposition: A | Discharge status: Home / Self Care | Payer: Self-pay | Source: Home / Self Care | Attending: Internal Medicine

## 2014-02-19 HISTORY — PX: LEFT HEART CATHETERIZATION WITH CORONARY ANGIOGRAM: SHX5451

## 2014-02-19 LAB — GLUCOSE, CAPILLARY
GLUCOSE-CAPILLARY: 115 mg/dL — AB (ref 70–99)
Glucose-Capillary: 109 mg/dL — ABNORMAL HIGH (ref 70–99)
Glucose-Capillary: 117 mg/dL — ABNORMAL HIGH (ref 70–99)
Glucose-Capillary: 132 mg/dL — ABNORMAL HIGH (ref 70–99)
Glucose-Capillary: 142 mg/dL — ABNORMAL HIGH (ref 70–99)
Glucose-Capillary: 213 mg/dL — ABNORMAL HIGH (ref 70–99)

## 2014-02-19 LAB — CBC
HCT: 33.6 % — ABNORMAL LOW (ref 36.0–46.0)
HCT: 33.7 % — ABNORMAL LOW (ref 36.0–46.0)
Hemoglobin: 11.2 g/dL — ABNORMAL LOW (ref 12.0–15.0)
Hemoglobin: 11.1 g/dL — ABNORMAL LOW (ref 12.0–15.0)
MCH: 27.3 pg (ref 26.0–34.0)
MCH: 26.9 pg (ref 26.0–34.0)
MCHC: 33.3 g/dL (ref 30.0–36.0)
MCHC: 32.9 g/dL (ref 30.0–36.0)
MCV: 81.8 fL (ref 78.0–100.0)
MCV: 81.6 fL (ref 78.0–100.0)
PLATELETS: 236 10*3/uL (ref 150–400)
Platelets: 266 10*3/uL (ref 150–400)
RBC: 4.11 MIL/uL (ref 3.87–5.11)
RBC: 4.13 MIL/uL (ref 3.87–5.11)
RDW: 14.7 % (ref 11.5–15.5)
RDW: 14.5 % (ref 11.5–15.5)
WBC: 4.9 10*3/uL (ref 4.0–10.5)
WBC: 5 10*3/uL (ref 4.0–10.5)

## 2014-02-19 LAB — RAPID URINE DRUG SCREEN, HOSP PERFORMED
Amphetamines: NOT DETECTED
Barbiturates: NOT DETECTED
Benzodiazepines: NOT DETECTED
Cocaine: NOT DETECTED
OPIATES: NOT DETECTED
TETRAHYDROCANNABINOL: NOT DETECTED

## 2014-02-19 LAB — BASIC METABOLIC PANEL
BUN: 19 mg/dL (ref 6–23)
CALCIUM: 9 mg/dL (ref 8.4–10.5)
CO2: 29 mEq/L (ref 19–32)
CREATININE: 0.55 mg/dL (ref 0.50–1.10)
Chloride: 94 mEq/L — ABNORMAL LOW (ref 96–112)
GFR calc non Af Amer: >90 mL/min (ref >90)
Glucose, Bld: 151 mg/dL — ABNORMAL HIGH (ref 70–99)
Potassium: 3.8 mEq/L (ref 3.7–5.3)
Sodium: 135 mEq/L — ABNORMAL LOW (ref 137–147)

## 2014-02-19 LAB — CREATININE, SERUM
Creatinine, Ser: 0.42 mg/dL — ABNORMAL LOW (ref 0.50–1.10)
GFR calc non Af Amer: >90 mL/min (ref >90)

## 2014-02-19 LAB — PROTIME-INR
INR: 1.04 (ref 0.00–1.49)
Prothrombin Time: 13.4 seconds (ref 11.6–15.2)

## 2014-02-19 LAB — TROPONIN I: Troponin I: <0.3 ng/mL (ref <0.30)

## 2014-02-19 SURGERY — LEFT HEART CATHETERIZATION WITH CORONARY ANGIOGRAM
Anesthesia: LOCAL

## 2014-02-19 MED ORDER — SODIUM CHLORIDE 0.9 % IV SOLN: 250.0000 mL | INTRAVENOUS | Status: DC | PRN

## 2014-02-19 MED ORDER — HEPARIN SODIUM (PORCINE) 5000 UNIT/ML IJ SOLN: 5000.0000 [IU] | Freq: Three times a day (TID) | INTRAMUSCULAR | Status: DC

## 2014-02-19 MED ORDER — FENTANYL CITRATE 0.05 MG/ML IJ SOLN
INTRAMUSCULAR | Status: AC
  Filled 2014-02-19: qty 2

## 2014-02-19 MED ORDER — HEPARIN SODIUM (PORCINE) 1000 UNIT/ML IJ SOLN
INTRAMUSCULAR | Status: AC
  Filled 2014-02-19: qty 1

## 2014-02-19 MED ORDER — VERAPAMIL HCL 2.5 MG/ML IV SOLN
INTRAVENOUS | Status: AC
  Filled 2014-02-19: qty 2

## 2014-02-19 MED ORDER — LIDOCAINE HCL (PF) 1 % IJ SOLN
INTRAMUSCULAR | Status: AC
  Filled 2014-02-19: qty 30

## 2014-02-19 MED ORDER — LISINOPRIL 20 MG PO TABS
20.0000 mg | ORAL_TABLET | Freq: Every day | ORAL | Status: DC
  Administered 2014-02-19: 20 mg via ORAL
  Filled 2014-02-19 (×2): qty 1

## 2014-02-19 MED ORDER — SODIUM CHLORIDE 0.9 % IV SOLN
INTRAVENOUS | Status: DC
  Administered 2014-02-19: 16:00:00 via INTRAVENOUS

## 2014-02-19 MED ORDER — SODIUM CHLORIDE 0.9 % IJ SOLN: 3.0000 mL | INTRAMUSCULAR | Status: DC | PRN

## 2014-02-19 MED ORDER — SODIUM CHLORIDE 0.9 % IJ SOLN
3.0000 mL | Freq: Two times a day (BID) | INTRAMUSCULAR | Status: DC
  Administered 2014-02-19: 3 mL via INTRAVENOUS

## 2014-02-19 MED ORDER — SODIUM CHLORIDE 0.9 % IV SOLN
INTRAVENOUS | Status: DC
  Administered 2014-02-19: 06:00:00 via INTRAVENOUS

## 2014-02-19 MED ORDER — MIDAZOLAM HCL 2 MG/2ML IJ SOLN
INTRAMUSCULAR | Status: AC
  Filled 2014-02-19: qty 2

## 2014-02-19 MED ORDER — HEPARIN (PORCINE) IN NACL 2-0.9 UNIT/ML-% IJ SOLN
INTRAMUSCULAR | Status: AC
  Filled 2014-02-19: qty 1000

## 2014-02-19 MED ORDER — NITROGLYCERIN 0.2 MG/ML ON CALL CATH LAB
INTRAVENOUS | Status: AC
  Filled 2014-02-19: qty 1

## 2014-02-19 MED ORDER — POLYETHYLENE GLYCOL 3350 17 G PO PACK
17.0000 g | PACK | Freq: Once | ORAL | Status: AC
  Administered 2014-02-19: 17 g via ORAL
  Filled 2014-02-19: qty 1

## 2014-02-19 MED ORDER — INSULIN ASPART 100 UNIT/ML ~~LOC~~ SOLN
0.0000 [IU] | Freq: Three times a day (TID) | SUBCUTANEOUS | Status: DC
  Administered 2014-02-19: 3 [IU] via SUBCUTANEOUS
  Administered 2014-02-20: 1 [IU] via SUBCUTANEOUS
  Administered 2014-02-20: 2 [IU] via SUBCUTANEOUS

## 2014-02-19 MED ORDER — ASPIRIN 81 MG PO CHEW
324.0000 mg | CHEWABLE_TABLET | Freq: Every day | ORAL | Status: DC
  Administered 2014-02-19 – 2014-02-20 (×2): 324 mg via ORAL
  Filled 2014-02-19 (×2): qty 4

## 2014-02-19 NOTE — Progress Notes (Signed)
BP 170/91 at this time; will page MD to make aware.

## 2014-02-19 NOTE — Clinical Documentation Improvement (Signed)
PLEASE SPECIFY TYPE AND ACUITY OF CHF Possible Clinical Conditions?  Chronic Systolic Congestive Heart Failure Chronic Diastolic Congestive Heart Failure Chronic Systolic & Diastolic Congestive Heart Failure Acute Systolic Congestive Heart Failure Acute Diastolic Congestive Heart Failure Acute Systolic & Diastolic Congestive Heart Failure Acute on Chronic Systolic Congestive Heart Failure Acute on Chronic Diastolic Congestive Heart Failure Acute on Chronic Systolic & Diastolic Congestive Heart Failure Other Condition Cannot Clinically Determine  Supporting Information: (As per notes)"CHF- elevated BNP ~2K, given one dose IV Lasix 20mg . Feeling much better with diuresis-- CXR with cardiomegaly and central pulmonary vasculature congestion. Superimposed bibasilar airspace opacities with small pleural effusions"  Thank You, Alessandra Grout, RN, BSN, Clinical Documentation Specialist:  [phone]   [phone] [place]

## 2014-02-19 NOTE — Progress Notes (Addendum)
Subjective: Patient seen and examined at the bedside this morning. She had one episode of chest "fluttering" overnight that resolved spontaneously. Denies cough, SOB, chest pain currently. She is nervous about her cath, but understands it's the best thing for her health.  No overnight events on telemetry.  Objective: Vital signs in last 24 hours: Filed Vitals:   02/19/14 0151 02/19/14 0427 02/19/14 0612 02/19/14 0701  BP: 156/89  153/94   Pulse: 78  75   Temp: 97.9 F (36.6 C)  98.1 F (36.7 C)   TempSrc: Oral  Oral   Resp: 18  18   Height:      Weight:  135 lb 1.6 oz (61.281 kg)  135 lb 1.6 oz (61.281 kg)  SpO2: 100%  95%    Weight change: -4.8 oz (-0.136 kg)  Intake/Output Summary (Last 24 hours) at 02/19/14 1357 Last data filed at 02/19/14 1332  Gross per 24 hour  Intake   1280 ml  Output   1550 ml  Net   -270 ml   Physical Exam  Constitutional: She is oriented to person, place, and time and well-developed, well-nourished, and in no distress.  Pleasant, conversant.  HENT:  Head: Normocephalic and atraumatic.  Eyes: Conjunctivae and EOM are normal. Pupils are equal, round, and reactive to light.  Neck: Normal range of motion. Neck supple. No JVD present.  Cardiovascular: Normal rate, regular rhythm, normal heart sounds and intact distal pulses. Exam reveals no gallop and no friction rub.  No murmur heard.  Pulmonary/Chest: Effort normal. No respiratory distress. She has no wheezes. She has rales (Crackles at bases bilaterally). She exhibits no tenderness.  Abdominal: Soft. She exhibits no distension and no mass. There is tenderness (Mildly tender to palpation in LLQ). There is no rebound and no guarding.  Well-healed midline surgical incision from exploratory laparotomy. Bowel sounds normo-active in RLQ, hypoactive in LLQ.  Musculoskeletal: Normal range of motion. She exhibits no edema and no tenderness.  Neurological: She is alert and oriented to person, place, and  time. No cranial nerve deficit. GCS score is 15.  Skin: Skin is warm and dry. No rash noted. She is not diaphoretic. No erythema. No pallor.  Psychiatric: Affect normal.   Lab Results: Basic Metabolic Panel:  Recent Labs Lab 02/18/14 0649 02/19/14 0251  NA 135* 135*  K 4.0 3.8  CL 94* 94*  CO2 29 29  GLUCOSE 130* 151*  BUN 14 19  CREATININE 0.44* 0.55  CALCIUM 9.6 9.0   Liver Function Tests:  Recent Labs Lab 02/18/14 0649  AST 21  ALT 18  ALKPHOS 70  BILITOT 0.5  PROT 6.5  ALBUMIN 3.3*   No results found for this basename: LIPASE, AMYLASE,  in the last 168 hours No results found for this basename: AMMONIA,  in the last 168 hours CBC:  Recent Labs Lab 02/18/14 0649 02/19/14 0251  WBC 5.4 4.9  HGB 11.1* 11.2*  HCT 33.3* 33.6*  MCV 81.2 81.8  PLT 269 266   Cardiac Enzymes:  Recent Labs Lab 02/18/14 1004 02/18/14 1532 02/19/14 0251  TROPONINI <0.30 <0.30 <0.30   BNP:  Recent Labs Lab 02/18/14 0648  PROBNP 2876.0*   D-Dimer: No results found for this basename: DDIMER,  in the last 168 hours CBG:  Recent Labs Lab 02/18/14 1624 02/18/14 2017 02/19/14 0002 02/19/14 0420 02/19/14 0758 02/19/14 1149  GLUCAP 162* 152* 132* 142* 115* 117*   Hemoglobin A1C:  Recent Labs Lab 02/18/14 1250  HGBA1C 7.6*  Fasting Lipid Panel: No results found for this basename: CHOL, HDL, LDLCALC, TRIG, CHOLHDL, LDLDIRECT,  in the last 168 hours Thyroid Function Tests:  Recent Labs Lab 02/18/14 1250  TSH 1.032   Coagulation:  Recent Labs Lab 02/19/14 1021  LABPROT 13.4  INR 1.04   Anemia Panel: No results found for this basename: VITAMINB12, FOLATE, FERRITIN, TIBC, IRON, RETICCTPCT,  in the last 168 hours Urine Drug Screen: Drugs of Abuse     Component Value Date/Time   LABOPIA NONE DETECTED 02/19/2014 0424   COCAINSCRNUR NONE DETECTED 02/19/2014 0424   LABBENZ NONE DETECTED 02/19/2014 0424   AMPHETMU NONE DETECTED 02/19/2014 0424   THCU NONE  DETECTED 02/19/2014 0424   LABBARB NONE DETECTED 02/19/2014 0424    Alcohol Level: No results found for this basename: ETH,  in the last 168 hours Urinalysis:  Recent Labs Lab 02/18/14 0715  COLORURINE YELLOW  LABSPEC 1.018  PHURINE 7.0  [place]  UROBILINOGEN 0.2  NITRITE NEGATIVE  LEUKOCYTESUR LARGE*    Micro Results: Recent Results (from the past 240 hour(s))  MRSA PCR SCREENING     Status: Abnormal   Collection Time    02/18/14  3:25 PM      Result Value Ref Range Status   MRSA by PCR POSITIVE (*) NEGATIVE Final   Comment:            The GeneXpert MRSA Assay (FDA     approved for NASAL specimens     only), is one component of a     comprehensive MRSA colonization     surveillance program. It is not     intended to diagnose MRSA     infection nor to guide or     monitor treatment for     MRSA infections.     RESULT CALLED TO, READ BACK BY AND VERIFIED WITH:     CARTER,D RN 02/18/14 1837 WOOTEN,K   Studies/Results: Ct Angio Chest Pe W/cm &/or Wo Cm  02/18/2014   CLINICAL DATA:  Shortness of breath, chest pain.  EXAM: CT ANGIOGRAPHY CHEST WITH CONTRAST  TECHNIQUE: Multidetector CT imaging of the chest was performed using the standard protocol during bolus administration of intravenous contrast. Multiplanar CT image reconstructions and MIPs were obtained to evaluate the vascular anatomy.  CONTRAST:  171mL OMNIPAQUE IOHEXOL 350 MG/ML SOLN  COMPARISON:  DG CHEST 1V PORT dated 02/18/2014  FINDINGS: There is cardiomegaly. Coronary artery calcifications in all 3 major coronary vessels. There are moderate bilateral pleural effusions. Airspace opacities in the lingula, right middle lobe and both lung bases likely reflect atelectasis. Scattered ground-glass opacities throughout the lungs likely reflect early interstitial edema. Mild vascular congestion noted.  Amorphous soft tissue density  throughout the mediastinum without measurable lymph nodes. This may represent scratch head this likely is related to congestion.  No filling defects in the pulmonary arteries to suggest pulmonary emboli. Chest wall soft tissues are unremarkable. Imaging into the upper abdomen shows no acute findings. Degenerative spurring in the mid and lower thoracic spine.  Review of the MIP images confirms the above findings.  IMPRESSION: Cardiomegaly. Moderate bilateral pleural effusions. Suspect early interstitial edema.  Patchy opacities at the lung bases involving the lower lobes, right middle lobe and lingula. Favor atelectasis although infection cannot be completely excluded.  No evidence of pulmonary embolus.   Electronically Signed   By: Rolm Baptise M.D.   On: 02/18/2014 08:24  Dg Chest Port 1 View  02/18/2014   CLINICAL DATA:  Mid chest pain. History of diabetes and hypertension.  EXAM: PORTABLE CHEST - 1 VIEW  COMPARISON:  None available for comparison at time of study interpretation.  FINDINGS: The cardiac silhouette appears mildly enlarged, mediastinal silhouette is nonsuspicious. Mild central pulmonary vasculature congestion with left greater than right lung base patchy alveolar airspace opacities. Small bilateral pleural effusions. Trachea projects midline and there is no pneumothorax.  Multiple EKG lines overlie the patient and may obscure subtle underlying pathology. Moderate degenerative change of the thoracic spine.  IMPRESSION: Mild cardiomegaly and central pulmonary vasculature congestion. Superimposed bibasilar airspace opacities with small pleural effusions, findings may reflect confluent edema or superimposed pneumonia. Recommend followup chest radiograph after treatment to verify improvement.   Electronically Signed   By: Elon Alas   On: 02/18/2014 06:47   Medications: I have reviewed the patient's current medications. Scheduled Meds: . [facility] HOLD] aspirin  324 mg Oral Daily  . [MAR HOLD]  ceFEPime (MAXIPIME) IV  1 g Intravenous Q8H  . [facility] HOLD] furosemide  20 mg Intravenous BID  . [MAR HOLD] heparin  5,000 Units Subcutaneous 3 times per day  . [MAR HOLD] insulin aspart  0-9 Units Subcutaneous 6 times per day  . [MAR HOLD] sodium chloride  3 mL Intravenous Q12H  . [facility] HOLD] sodium chloride  3 mL Intravenous Q12H  . sodium chloride  3 mL Intravenous Q12H  . [facility] HOLD] vancomycin  750 mg Intravenous Q12H   Continuous Infusions: . sodium chloride 75 mL/hr at 02/19/14 0600   PRN Meds:.[MAR HOLD] sodium chloride, sodium chloride, [MAR HOLD] acetaminophen, [MAR HOLD] ondansetron (ZOFRAN) IV, [MAR HOLD] ondansetron, [MAR HOLD] polyethylene glycol, [MAR HOLD] sodium chloride, sodium chloride  Assessment/Plan: Alicia [name] is a 58 y.o. female with PMH CHF (unknown EF), DM2, HTN, CVA who presents with a chief complaint of chest pain.   #Chest pain, rule out MI - Troponins were negative x3. No overnight events on telemetry. EKG stable. She will get a cardiac catheterization today given history of 1 vessel CAD and concerning EKG findings. TSH wnl. Picture not convincing for HCAP (she is afebrile, AVSS), so we will discontinue antibiotics. - Appreciate cardiology recs, Dr. Marlou Porch  - Cath today - ASA 324mg  daily - Stop vancomycin and cefepime IV, continue to monitor for signs/symptoms of pneumonia - Oxygen therapy to keep O2 sats greater than 90%  - Zofran when necessary for nausea  - BMP, CBC in am   #Chronic systolic CHF - 2D echo 2/58 showed EF 30-40%, moderate concentric left hypertrophy, moderately reduced left ventricular systolic function. Pro-BNP elevated on admission but unknown baseline. Does not appear to take Lasix at home. - Lasix 20 mg IV twice a day  - Daily weights > 136 > 135 - Strict ins and outs > net +30 since admission - 2D echo (may not need to do it if they can assess EF during cath)  #Mild LLQ abdominal pain 2/2 constipation - Patient is status post  exploratory laparotomy with lysis of adhesions and small bowel resection with primary anastomosis as well as repair of a hernia in 02/15. She is passing gas, but has not had a bowel movement in 2-3 days. Discomfort is likely secondary to constipation. Lactate wnl. - MiraLAX 17 g daily  - Will consider enema tomorrow, after cath is done and she is able to ambulate to restroom  #DM2 - A1C 7.6 here. Home meds include Novolin  70/30 5 units daily with breakfast.  - Sliding scale insulin, sensitive  - CBG monitoring q4h while NPO   #HTN - Blood pressure is elevated at 150-160s/90s. At home she takes HCTZ 25 mg daily and lisinopril 20 mg daily.  - Lasix as above  - Consider restarting home meds after cath  #Trichomonas - Noted on urinalysis. She is s/p Flagyl 1 g IV x2.  #DVT PPX - Heparin subcutaneous   Dispo: Disposition is deferred at this time, awaiting improvement of current medical problems.  Anticipated discharge in approximately 1-3 day(s).   The patient does have a current PCP (Lorayne Marek, MD) and does not need an [facility] hospital follow-up appointment after discharge.  The patient does not have transportation limitations that hinder transportation to clinic appointments.  .Services Needed at time of discharge: Y = Yes, Blank = No PT:   OT:   RN:   Equipment:   Other:     LOS: 1 day   Lesly Dukes, MD 02/19/2014, 1:57 PM  Lesly Dukes, MD  [email or web address] Pager # [phone] Office # [phone]

## 2014-02-19 NOTE — Progress Notes (Signed)
No call back from MD; MD paged again regarding BP; will await callback.

## 2014-02-19 NOTE — H&P (Signed)
INTERNAL MEDICINE TEACHING SERVICE Attending Admission Note  Date: 02/19/2014  Patient name: Madasyn Heath Loken  Medical record number: [id number]  Date of birth: [birth year]    I have seen and evaluated Kristilyn Coltrane Gavilanes and discussed their care with the Residency Team.  58 yr old woman with pmhx HFrEF, Type 2 DM, CVA, HTN, presented with CP, SOB and palpitations. She states that the pain felt like a first in her chest. She admits to having similar pain the past which caused her to undergo stress testing and a LHC. She reported that these were normal, but review of records indicates that she has single vessel CAD with distal OM2 stenosis of 80%. She was recently admitted with abdominal pain and found to have a closed loop SBO with hernia and strangulated small bowel which required urgent surgery. She admits to no BM in about 3 days which she thinks is causing LLQ pain.  On exam, Filed Vitals:   02/19/14 0612  BP: 153/94  Pulse: 75  Temp: 98.1 F (36.7 C)  Resp: 18   GEN: AAOx3, NAD HEENT: EOMI, PERRL, no icterus, no adenopathy. CV: S1S2, no m/r/g, RRR PULM: CTA bilat this morning. ABD/GI: Mild LLQ tenderness, no guarding, noted healed midline surgical incision. +BS. LE/UE: 2/4 pulses, no c/c/e. NEURO: CN II-XII intact, no focal deficits.   A/P -CP: Review of EKG shows some concerning anterior changes. The patient reports improvement of pain with SL NTG. Trop negative thus far. She is CP free this morning. Appreciate cards input, plans for LHC given hx. -Review of CTA chest performed shows no PE, but patchy opacities at lung bases involving lower lobes, right middle lobe, and lingula favored to be atelectasis. She has no fever, no leukocytosis, no hypoxia, but stated she has had chills at home. I'm not sure I'm sold on this being HCAP. I would hold off on Abx therapy for now and monitor her. -She did not have a BM with recent miralax tx, suggest enema post LHC (at least 4 hours post so she  can get out of bed). -rest per resident note.  Dominic Pea, DO, [facility] 02/19/2014, 1:02 PM

## 2014-02-19 NOTE — H&P (View-Only) (Signed)
CARDIOLOGY CONSULT NOTE   Patient ID: Chasey Dull Stroder MRN: [id number] DOB/AGE: [birth year] 58 y.o.  Admit date: 02/18/2014  Primary Physician   Lorayne Marek, MD Primary Cardiologist    Reason for Consultation  Chest pain   HPI: Alicia [name] is a 58 y.o. female with PMH CHF (unknown EF), DM2, HTN, CVA who presents with a chief complaint of chest pain associated with SOB and palpitations. She started noticing chest pain apprx 3 days ago. She describes the pain as a "fist in her chest." Denies radiation. She's never had a heart attack before, but notes that she did have an exercise stress test and cardiac catheterization in [place], [place] over 10 years ago. She thinks both of these tests were normal, but did follow up with a cardiologist for an "irregular heart beat.:"  Last night she also developed a cough. She endorses some chills at home, but no fever. She denies leg swelling or weight gain.  She also has some left lower quadrant abdominal pain, but no nausea, vomiting, or diarrhea. She was admitted here in February 2015 with a closed loop small bowel obstruction with hernia and strangulated small bowel, and underwent exploratory laparotomy with lysis of adhesions and small bowel resection with primary anastomosis as well as repair of the hernia. Postop, she had some ileus and issues with hypertension. She notes she has had some "rib pain" since discharge. She has also had some constipation. Her last bowel movement was 2 days ago. She is passing gas.  She smokes half a pack of cigarettes per day. Denies any alcohol or drug use, including cocaine. She moved to [place] 6 months ago.           Past Medical History  Diagnosis Date  . Diabetes mellitus, type II   . Hypertension   . H/O: CVA (cerebrovascular accident)     6 strokes, most recent on 05/10/13  . CHF (congestive heart failure)   . Stroke      Past Surgical History  Procedure Laterality Date  . Tubal  ligation    . Cardiac catheterization    . Laparotomy N/A 01/15/2014    Procedure: EXPLORATORY LAPAROTOMY SMALL BOWEL RESECTION;  Surgeon: Imogene Burn. Georgette Dover, MD;  Location: [facility];  Service: General;  Laterality: N/A;  . Lysis of adhesion N/A 01/15/2014    Procedure: LYSIS OF ADHESION;  Surgeon: Imogene Burn. Georgette Dover, MD;  Location: [facility];  Service: General;  Laterality: N/A;    Allergies  Allergen Reactions  . Asa [Aspirin] Other (See Comments)    Told not to take med-per MD  . Other Other (See Comments)    Told not to take any dairy products-per MD    I have reviewed the patient's current medications . vancomycin  750 mg Intravenous Q12H   . ceFEPime (MAXIPIME) IV    . vancomycin       Prior to Admission medications   Medication Sig Start Date End Date Taking? Authorizing Provider  hydrochlorothiazide (HYDRODIURIL) 25 MG tablet Take 1 tablet (25 mg total) by mouth daily. 02/09/14  Yes Lorayne Marek, MD  insulin NPH-regular Human (NOVOLIN 70/30) (70-30) 100 UNIT/ML injection Inject 5 Units into the skin daily with breakfast.   Yes Historical Provider, MD  lisinopril (PRINIVIL,ZESTRIL) 20 MG tablet Take 1 tablet (20 mg total) by mouth daily. 02/09/14  Yes Lorayne Marek, MD     History   Social History  . Marital Status: Widowed  Spouse Name: N/A    Number of Children: N/A  . Years of Education: N/A   Occupational History  . Not on file.   Social History Main Topics  . Smoking status: Current Every Day Smoker -- 0.50 packs/day for 25 years    Types: Cigarettes  . Smokeless tobacco: Never Used  . Alcohol Use: No  . Drug Use: No  . Sexual Activity: Yes   Other Topics Concern  . Not on file   Social History Narrative  . No narrative on file    Family Status  Relation Status Death Age  . Mother Alive   . Father Deceased    Family History  Problem Relation Age of Onset  . Diabetes Mellitus II Mother   . Hypertension Mother   . CVA Mother      ROS:  Full 14 point  review of systems complete and found to be negative unless listed above.  Physical Exam: Blood pressure 162/93, pulse 80, temperature 97.8 F (36.6 C), temperature source Oral, resp. rate 21, height 5\' 6"  (1.676 m), weight 136 lb (61.689 kg), SpO2 92.00%.  General: Well developed, mal nourished, female who appears upset Head: Eyes PERRLA, No xanthomas.   Normocephalic and atraumatic, oropharynx without edema or exudate. Dentition:  Lungs: mild wheezing, crackles at bases Heart: HRRR S1 S2, no rub/gallop, Heart irregular rate and rhythm with S1, S2  murmur. pulses are 2+ extrem.   Neck: No carotid bruits. No lymphadenopathy. No  JVD. Abdomen: Tenderness with palpation, worse in LLQ. Well-healed midline surgical incision from exploratory laparotomy. Msk:  No spine or cva tenderness. No weakness, no joint deformities or effusions. Extremities: No clubbing or cyanosis. No  edema.  Neuro: Alert and oriented X 3. No focal deficits noted. Psych:  Good affect, responds appropriately Skin: No rashes or lesions noted.  Labs:   Lab Results  Component Value Date   WBC 5.4 02/18/2014   HGB 11.1* 02/18/2014   HCT 33.3* 02/18/2014   MCV 81.2 02/18/2014   PLT 269 02/18/2014     Recent Labs Lab 02/18/14 0649  NA 135*  K 4.0  CL 94*  CO2 29  BUN 14  CREATININE 0.44*  CALCIUM 9.6  PROT 6.5  BILITOT 0.5  ALKPHOS 70  ALT 18  AST 21  GLUCOSE 130*  ALBUMIN 3.3*   Magnesium  Date Value Ref Range Status  01/18/2014 1.7  1.5 - 2.5 mg/dL Final     Recent Labs  02/18/14 0658  TROPIPOC 0.02   Pro B Natriuretic peptide (BNP)  Date/Time Value Ref Range Status  02/18/2014  6:48 AM 2876.0* 0 - 125 pg/mL Final    Echo: pending  ECG:  Sinus rhythm Short PR interval, LVH Mild ST elevation anterior and ST changes inferior versus previous.  Radiology:  Ct Angio Chest Pe W/cm &/or Wo Cm  02/18/2014   CLINICAL DATA:  Shortness of breath, chest pain.  EXAM: CT ANGIOGRAPHY CHEST WITH CONTRAST   TECHNIQUE: Multidetector CT imaging of the chest was performed using the standard protocol during bolus administration of intravenous contrast. Multiplanar CT image reconstructions and MIPs were obtained to evaluate the vascular anatomy.  CONTRAST:  174mL OMNIPAQUE IOHEXOL 350 MG/ML SOLN  COMPARISON:  DG CHEST 1V PORT dated 02/18/2014  FINDINGS: There is cardiomegaly. Coronary artery calcifications in all 3 major coronary vessels. There are moderate bilateral pleural effusions. Airspace opacities in the lingula, right middle lobe and both lung bases likely reflect atelectasis. Scattered ground-glass opacities throughout  the lungs likely reflect early interstitial edema. Mild vascular congestion noted.  Amorphous soft tissue density throughout the mediastinum without measurable lymph nodes. This may represent scratch head this likely is related to congestion.  No filling defects in the pulmonary arteries to suggest pulmonary emboli. Chest wall soft tissues are unremarkable. Imaging into the upper abdomen shows no acute findings. Degenerative spurring in the mid and lower thoracic spine.  Review of the MIP images confirms the above findings.  IMPRESSION: Cardiomegaly. Moderate bilateral pleural effusions. Suspect early interstitial edema.  Patchy opacities at the lung bases involving the lower lobes, right middle lobe and lingula. Favor atelectasis although infection cannot be completely excluded.  No evidence of pulmonary embolus.    Dg Chest Port 1 View  02/18/2014   CLINICAL DATA:  Mid chest pain. History of diabetes and hypertension.  EXAM: PORTABLE CHEST - 1 VIEW  COMPARISON:  None available for comparison at time of study interpretation.  FINDINGS: The cardiac silhouette appears mildly enlarged, mediastinal silhouette is nonsuspicious. Mild central pulmonary vasculature congestion with left greater than right lung base patchy alveolar airspace opacities. Small bilateral pleural effusions. Trachea projects  midline and there is no pneumothorax.  Multiple EKG lines overlie the patient and may obscure subtle underlying pathology. Moderate degenerative change of the thoracic spine.  IMPRESSION: Mild cardiomegaly and central pulmonary vasculature congestion. Superimposed bibasilar airspace opacities with small pleural effusions, findings may reflect confluent edema or superimposed pneumonia. Recommend followup chest radiograph after treatment to verify improvement.   ASSESSMENT AND PLAN:    Active Problems:   Dyspnea  Chest pain 3 days of intermittent left-sided chest pain, nonradiating, associated with shortness of breath, worse when lying down at night. Patient does have significant cardiac risk factors including DM2, HTN, CHF, tobacco abuse. --Troponin POC negative, ECG with no acute ST or TW changes; however, some ST changes in anterior leads when compared to previous tracings. No active chest pain. 5/10 chest pain completely relieved with 2 SL NTG. --Cycle Troponin and serial ECGs --Will admit to telemtry for observation. Will likely need a cardiac cath in the AM, make NPO after midnight and pre cath orders put in  CHF- elevated BNP ~2K, given one dose IV Lasix 20mg . Feeling much better with diuresis -- CXR with cardiomegaly and central pulmonary vasculature congestion. Superimposed bibasilar airspace opacities with small pleural effusions -- strict I/0s and daily weights -- ECHO pending.  -- CTA was negative for pulmonary embolism  HTN- mildly elevated today. Cont HCTZ 25 mg & lisinopril 20 mg  Possible HCAP-  She is with cough and there are opacities/effusions on CT, she was recently hospitalized in Feb for SBO s/p resection. Cont IV abx per internal medicine   Abdominal pain- pending lactic acid. Recent SBO   UTI with trichomonas- continue ABx per IM  Signed: Perry Mount, PA-C 02/18/2014 9:53 AM  Pager [phone]   Personally seen and examined, agree with above. Symptoms of "fist in  her chest "as well as EKG changes are concerning for possible underlying coronary artery disease/ischemia. I would like for her to proceed with heart catheterization. Risks and benefits including stroke, heart attack, death, renal impairment, bleeding have been discussed.   Echocardiogram from outside hospital demonstrated EF of 35-40%. It does not sound as though she has had a cardiac catheterization since discovery of possible ischemic cardiomyopathy.  BNP was elevated at 2800, troponin thus far is negative. Creatinine is 0.4.  She has had recent small bowel obstruction lysis of adhesions  in February. No further upcoming surgeries.  Outside hospital records provided by Dr. Lucila Maine:  [facility]:  11/21/2004 - Patient presented to the emergency room in [place] with chest pain and significant EKG changes, troponin was elevated to 0.9, chest pain was nitroglycerin sensitive, after extensive counseling by cardiologist and emergency room physicians, she left [place] prior to cardiac catheterization.   12/24/04 - Patient came back to the emergency room with dyspnea on exertion and was agreeable to cath this time. She underwent left heart catheterization, left ventriculogram, coronary angiography, bilateral selective renal angiography, RFA angiography.  Impression:  1. Cardiomyopathy with severe LV dysfunction - LVEF 25% with dilated LV cavity, severe global hypokinesis, very focal akinesis of mid inferior wall  2. Single-vessel CAD with distal OM 2 stenosis (80%), likely culprit for acute coronary syndrome several weeks ago, this also correlates with wall motion abnormality  3. Severe hypertension  4. No significant renal artery stenosis  5. No mitral regurgitation or aortic stenosis  Report also notes 40% distal luminal irregularities of the RCA. RFA has 30-40% disease in it, there is an anatomical variant with a large vessel coming off  superior to the bifurcation of the RFA giving off an inferior epigastric as well as a profunda, no Perclose was performed because of unusual anatomy.   It does not appear that any intervention was done (stent, etc). Recommendations were to aggressively treat blood pressure, continue medical therapy of coronary artery disease and nonischemic cardiomyopathy.   07/23/2009 - Nuclear Lexiscan stress test negative, no changes consistent with ischemia. (Reason for test was chest pain.)   05/11/2013 - 2-D echocardiogram shows moderate concentric left hypertrophy, moderately reduced left ventricular systolic function with an EF of 30-40%.   Dr. Tresa Endo was her primary care doctor in [place]  Dr. Doreene Nest did her cath.  Lesly Dukes, MD  Candee Furbish, MD

## 2014-02-19 NOTE — Interval H&P Note (Signed)
History and Physical Interval Note:  02/19/2014 1:45 PM  Alicia [name]  has presented today for surgery, with the diagnosis of chest pain  The various methods of treatment have been discussed with the patient and family. After consideration of risks, benefits and other options for treatment, the patient has consented to  Procedure(s): LEFT HEART CATHETERIZATION WITH CORONARY ANGIOGRAM (N/A) with possible PCICath Lab Visit (complete for each Cath Lab visit)  Clinical Evaluation Leading to the Procedure:   ACS: no  Non-ACS:    Anginal Classification: CCS III  Anti-ischemic medical therapy: Maximal Therapy (2 or more classes of medications)  Non-Invasive Test Results: No non-invasive testing performed  Prior CABG: No previous CABG       as a surgical intervention .  The patient's history has been reviewed, patient examined, no change in status, stable for surgery.  I have reviewed the patient's chart and labs.  Questions were answered to the patient's satisfaction.     Chenell Lozon A

## 2014-02-19 NOTE — Progress Notes (Signed)
Patient ID: Alicia [name], female   DOB: 10/07/1956, 58 y.o.   MRN: [id number]  I reviewed the cardiology note from yesterday by Dr. Marlou Porch. I have called the cath lab, and the patient is scheduled for catheterization today.  Daryel November, MD

## 2014-02-19 NOTE — Care Management Note (Signed)
    Page 1 of 1   02/19/2014     11:16:10 AM   CARE MANAGEMENT NOTE 02/19/2014  Patient:  Alicia [name], Alicia [name]   Account Number:  [id number]  Date Initiated:  02/19/2014  Documentation initiated by:  [facility]  Subjective/Objective Assessment:   58 yo female READMISSION:3/29 (CHF exacerbation and CAP); 2/20 (SBO)//From home with daughter     Action/Plan:   IV diureses; IV abx//Access for [facility] services   Anticipated DC Date:  02/22/2014   Anticipated DC Plan:  [place]  CM consult      Choice offered to / List presented to:             Status of service:  In process, will continue to follow Medicare Important Message given?   (If response is "NO", the following Medicare IM given date fields will be blank) Date Medicare IM given:   Date Additional Medicare IM given:    Discharge Disposition:    Per UR Regulation:  Reviewed for med. necessity/level of care/duration of stay  If discussed at [place] of Stay Meetings, dates discussed:    Comments:

## 2014-02-19 NOTE — Progress Notes (Signed)
MD returned page; new orders received; will administer when available from pharmacy.

## 2014-02-19 NOTE — Progress Notes (Signed)
TR band removed at this time; dressing applied to site; R radial site level 0; no bleeding noted; will cont. To monitor.

## 2014-02-19 NOTE — ED Provider Notes (Signed)
Medical screening examination/treatment/procedure(s) were conducted as a shared visit with non-physician practitioner(s) or resident and myself. I personally evaluated the patient during the encounter and agree with the findings and plan unless otherwise indicated.  I have personally reviewed any xrays and/ or EKG's with the provider and I agree with interpretation.  Patient with diabetes, high blood pressure, CHF, smoking presents with shortness of breath and chest pain episodes recently. Patient has had brief episodes at night only the past 3 days. Patient has had mild similar in the past which he feels is likely related to her CHF. The concern is that the sepsis is more severe and she also had a recent major surgery in February. Exam patient has mild crackles at the bases, no distress, abdominal soft nontender, well-appearing female. Plan for workup for cardiac, CHF and pulmonary embolism with recent surgery. Chest x-ray shows possible mild pulmonary edema/atypical pneumonia. Symptoms did improve with nitroglycerin per patient. EKG reviewed and did show changes compared to previous, mild ST elevation anterior and ST changes inferior. Plan for CT angina chest followed by telemetry observation/admission. Signed out to followup results.  Chest pain, Shortness of breath, Acute CHF, HCAP   Mariea Clonts, MD 02/19/14 0730

## 2014-02-19 NOTE — CV Procedure (Signed)
Alicia [name] is a 58 y.o. female    [id number]  [id number] LOCATION:  FACILITY: [facility]  PHYSICIAN: Troy Sine, MD, [facility] 1956-07-20   DATE OF PROCEDURE:  02/19/2014    CARDIAC CATHETERIZATION     HISTORY:  Alicia [name] is a 58 year old African American female with a history of previous directly documented triopathy, diabetes mellitus, hypertension, and remote CVA. She had undergone a previous cardiac catheterization in [place] in 2006 which showed an ejection fraction of 25%. At that time she also had distal 80% stenosis in the dome two-vessel and 40% distal luminal irregularities of the right coronary artery. She has been treated medically. She was admitted presently with a three-day history of chest tightness and pressure. She now presents for definitive cardiac catheterization.   PROCEDURE:  The patient was brought to the second floor [facility] cath lab in the postabsorptive state. The right radial approach was utilized after an Allen's test was positive. She was premedicated with Versed 2 mg and fentanyl 42mcg. right radial artery was punctured via the Seldinger technique and a 5/6 glide sheath slightly was inserted without difficulty. A radial cocktail consisting of verapamil, nitroglycerin, and lidocaine was administered. A JFR 4 diagnostic cathterwas advanced over a safety J. wire to the central aorta without difficulty. Weight-based heparin 3000 units was administered. Selective angiography in the right coronary artery was performed. This catheter was exchanged for an JL 3.5 catheter and selective angiography of the left coronary  system was performed. A pigtail catheter 5 [place] was then inserted for left ventriculography. The pigtail catheter was removed. A TR band was applied at 1425 at 14 cc of air for hemostasis. The patient tolerated the procedure well.  HEMODYNAMICS:   Central Aorta: 142/80   Left Ventricle: 142/21  ANGIOGRAPHY:  1. Left main:  Angiographically normal and trifurcated into an LAD, a ramus intermediate vessel, and left circumflex coronary artery.  2. LAD: This is a large caliber vessel which wrapped around the LV apex to supply the distal inferior wall. The LAD gave rise to 2 major diagonal vessels and septal perforating arteries and is free of significant disease. 3. Ramus intermediate: Angiographically normal vessel 4. Left circumflex: Angiographically normal vessel gave rise to one major marginal branch 5. Right coronary artery: There is mild calcification proximally. There was evidence for 30-40% stenosis after a marginal branch in the mid vessel. The RCA had diffuse irregularity of 60% distally proximal to the distal PD/PLA branch.  Left ventriculography revealed severe global LV dysfunction with an ejection fraction of 25%.   IMPRESSION:  Nonischemic cardiomyopathy with an ejection fraction of approximately 25% and severe diffuse global hypokinesis  Predominantly single vessel coronary artery disease with 30% mid RCA stenosis and 60% diffuse RCA stenosis with normal appearing left coronary system consisting of the LAD, intermediate vessel, and left circumflex coronary arteries.  RECOMMENDATION:  The patient's severe LV dysfunction is out of proportion to her coronary obstructive disease. Aggressive medical therapy will be implemented in hope of potential LV function improvement.  Troy Sine, MD, [facility] 02/19/2014 2:38 PM

## 2014-02-20 DIAGNOSIS — K5900 Constipation, unspecified: Secondary | ICD-10-CM

## 2014-02-20 DIAGNOSIS — E876 Hypokalemia: Secondary | ICD-10-CM

## 2014-02-20 LAB — CBC
HCT: 32.6 % — ABNORMAL LOW (ref 36.0–46.0)
Hemoglobin: 10.9 g/dL — ABNORMAL LOW (ref 12.0–15.0)
MCH: 27.2 pg (ref 26.0–34.0)
MCHC: 33.4 g/dL (ref 30.0–36.0)
MCV: 81.3 fL (ref 78.0–100.0)
Platelets: 244 10*3/uL (ref 150–400)
RBC: 4.01 MIL/uL (ref 3.87–5.11)
RDW: 14.4 % (ref 11.5–15.5)
WBC: 5.8 10*3/uL (ref 4.0–10.5)

## 2014-02-20 LAB — BASIC METABOLIC PANEL
BUN: 14 mg/dL (ref 6–23)
CO2: 28 mEq/L (ref 19–32)
CREATININE: 0.42 mg/dL — AB (ref 0.50–1.10)
Calcium: 8.8 mg/dL (ref 8.4–10.5)
Chloride: 95 mEq/L — ABNORMAL LOW (ref 96–112)
GFR calc non Af Amer: >90 mL/min (ref >90)
Glucose, Bld: 101 mg/dL — ABNORMAL HIGH (ref 70–99)
Potassium: 3.5 mEq/L — ABNORMAL LOW (ref 3.7–5.3)
Sodium: 136 mEq/L — ABNORMAL LOW (ref 137–147)

## 2014-02-20 LAB — GLUCOSE, CAPILLARY
Glucose-Capillary: 123 mg/dL — ABNORMAL HIGH (ref 70–99)
Glucose-Capillary: 146 mg/dL — ABNORMAL HIGH (ref 70–99)

## 2014-02-20 MED ORDER — LISINOPRIL 20 MG PO TABS
30.0000 mg | ORAL_TABLET | Freq: Every day | ORAL | Status: DC
  Administered 2014-02-20: 30 mg via ORAL
  Filled 2014-02-20: qty 1

## 2014-02-20 MED ORDER — MINERAL OIL RE ENEM
1.0000 | ENEMA | Freq: Once | RECTAL | Status: AC
  Administered 2014-02-20: 1 via RECTAL
  Filled 2014-02-20: qty 1

## 2014-02-20 MED ORDER — POLYETHYLENE GLYCOL 3350 17 G PO PACK: 17.0000 g | PACK | Freq: Every day | ORAL | Status: DC | PRN

## 2014-02-20 MED ORDER — AMLODIPINE BESYLATE 5 MG PO TABS: 5.0000 mg | ORAL_TABLET | Freq: Every day | ORAL | Status: DC

## 2014-02-20 MED ORDER — FUROSEMIDE 40 MG PO TABS: 40.0000 mg | ORAL_TABLET | Freq: Two times a day (BID) | ORAL | Status: DC

## 2014-02-20 MED ORDER — POTASSIUM CHLORIDE CRYS ER 20 MEQ PO TBCR
40.0000 meq | EXTENDED_RELEASE_TABLET | Freq: Once | ORAL | Status: AC
Start: 2014-02-20 — End: 2014-02-20
  Administered 2014-02-20: 40 meq via ORAL
  Filled 2014-02-20: qty 2

## 2014-02-20 MED ORDER — CHLORHEXIDINE GLUCONATE CLOTH 2 % EX PADS
6.0000 | MEDICATED_PAD | Freq: Every day | CUTANEOUS | Status: DC
  Administered 2014-02-20: 6 via TOPICAL

## 2014-02-20 MED ORDER — HYDROCHLOROTHIAZIDE 25 MG PO TABS
25.0000 mg | ORAL_TABLET | Freq: Every day | ORAL | Status: DC
  Filled 2014-02-20: qty 1

## 2014-02-20 MED ORDER — FUROSEMIDE 40 MG PO TABS
40.0000 mg | ORAL_TABLET | Freq: Two times a day (BID) | ORAL | Status: DC
  Administered 2014-02-20: 40 mg via ORAL
  Filled 2014-02-20 (×3): qty 1

## 2014-02-20 MED ORDER — ASPIRIN 81 MG PO CHEW: 81.0000 mg | CHEWABLE_TABLET | Freq: Every day | ORAL | Status: DC

## 2014-02-20 MED ORDER — POTASSIUM CHLORIDE ER 20 MEQ PO TBCR
10.0000 meq | EXTENDED_RELEASE_TABLET | Freq: Two times a day (BID) | ORAL | Status: DC
Start: 2014-02-20 — End: 2014-03-22

## 2014-02-20 MED ORDER — LISINOPRIL 30 MG PO TABS: 30.0000 mg | ORAL_TABLET | Freq: Every day | ORAL | Status: DC

## 2014-02-20 MED ORDER — MUPIROCIN 2 % EX OINT
1.0000 "application " | TOPICAL_OINTMENT | Freq: Two times a day (BID) | CUTANEOUS | Status: DC
  Administered 2014-02-20: 1 via NASAL
  Filled 2014-02-20: qty 22

## 2014-02-20 MED ORDER — AMLODIPINE BESYLATE 5 MG PO TABS
5.0000 mg | ORAL_TABLET | Freq: Every day | ORAL | Status: DC
  Administered 2014-02-20: 5 mg via ORAL
  Filled 2014-02-20: qty 1

## 2014-02-20 NOTE — Progress Notes (Signed)
  Date: 02/20/2014  Patient name: Alicia [name]  Medical record number: [id number]  Date of birth: 1956-09-08   This patient has been seen and the plan of care was discussed with the house staff. Please see their note for complete details. I concur with their findings with the following additions/corrections: Feels well today except stating that she remains constipated.  She is s/p LHC with evidence of non ischemic CM with EF 25%. She had findings of single vessel CAD with 30% mid RCA stenosis and 60% diffuse RCA stenosis. Agree with current medical management. She needs an enema today to have a BM, then can be D/C home with cardiology follow up.  Dominic Pea, DO, [facility] 02/20/2014, 11:57 AM

## 2014-02-20 NOTE — Progress Notes (Signed)
Subjective: Feeling better  Objective: Vital signs in last 24 hours: Temp:  [97.7 F (36.5 C)-98.3 F (36.8 C)] 98.3 F (36.8 C) (03/31 0422) Pulse Rate:  [76-83] 80 (03/31 0422) Resp:  [18] 18 (03/31 0422) BP: (144-182)/(75-94) 163/88 mmHg (03/31 0422) SpO2:  [95 %-99 %] 96 % (03/31 0422) Weight:  [136 lb 9.6 oz (61.961 kg)] 136 lb 9.6 oz (61.961 kg) (03/31 0639) Last BM Date: 02/16/14  Intake/Output from previous day: 03/30 0701 - 03/31 0700 In: 521.3 [P.O.:360; I.V.:161.3] Out: 1900 [Urine:1900] Intake/Output this shift:    Medications Current Facility-Administered Medications  Medication Dose Route Frequency Provider Last Rate Last Dose  . 0.9 %  sodium chloride infusion  250 mL Intravenous PRN Blain Pais, MD      . 0.9 %  sodium chloride infusion   Intravenous Continuous Troy Sine, MD 75 mL/hr at 02/19/14 0600    . 0.9 %  sodium chloride infusion   Intravenous Continuous Troy Sine, MD 75 mL/hr at 02/19/14 1556    . acetaminophen (TYLENOL) tablet 650 mg  650 mg Oral Q6H PRN Lesly Dukes, MD   650 mg at 02/19/14 0019  . amLODipine (NORVASC) tablet 5 mg  5 mg Oral Daily Lesly Dukes, MD      . aspirin chewable tablet 324 mg  324 mg Oral Daily Lesly Dukes, MD   324 mg at 02/19/14 9675  . furosemide (LASIX) tablet 40 mg  40 mg Oral BID Lesly Dukes, MD      . heparin injection 5,000 Units  5,000 Units Subcutaneous 3 times per day Blain Pais, MD   5,000 Units at 02/20/14 [phone]  . insulin aspart (novoLOG) injection 0-9 Units  0-9 Units Subcutaneous TID AC & HS Dominic Pea, DO   1 Units at 02/20/14 0735  . lisinopril (PRINIVIL,ZESTRIL) tablet 20 mg  20 mg Oral Daily Marrion Coy, MD   20 mg at 02/19/14 1801  . ondansetron (ZOFRAN) tablet 4 mg  4 mg Oral Q6H PRN Blain Pais, MD       Or  . ondansetron [facility]) injection 4 mg  4 mg Intravenous Q6H PRN Blain Pais, MD   4 mg at 02/18/14 1358  . polyethylene glycol (MIRALAX /  GLYCOLAX) packet 17 g  17 g Oral Daily PRN Blain Pais, MD   17 g at 02/18/14 1316  . potassium chloride SA (K-DUR,KLOR-CON) CR tablet 40 mEq  40 mEq Oral Once Lesly Dukes, MD      . sodium chloride 0.9 % injection 3 mL  3 mL Intravenous Q12H Blain Pais, MD   3 mL at 02/19/14 1000  . sodium chloride 0.9 % injection 3 mL  3 mL Intravenous Q12H Blain Pais, MD   3 mL at 02/19/14 1000  . sodium chloride 0.9 % injection 3 mL  3 mL Intravenous PRN Blain Pais, MD        PE: General appearance: alert, cooperative and no distress Neck: mildly JVD Lungs: Bilateral Rales Heart: regular rate and rhythm, S1, S2 normal, no murmur, click, rub or gallop Abdomen: +BS Extremities: No LEE Pulses: 2+ and symmetric Skin: Warm and dry,  Right radial cath site: no ecchymosis. Nontender Neurologic: Grossly normal  Lab Results:   Recent Labs  02/19/14 0251 02/19/14 1615 02/20/14 0446  WBC 4.9 5.0 5.8  HGB 11.2* 11.1* 10.9*  HCT 33.6* 33.7* 32.6*  PLT 266 236 244   BMET  Recent  Labs  02/18/14 0649 02/19/14 0251 02/19/14 1615 02/20/14 0446  NA 135* 135*  --  136*  K 4.0 3.8  --  3.5*  CL 94* 94*  --  95*  CO2 29 29  --  28  GLUCOSE 130* 151*  --  101*  BUN 14 19  --  14  CREATININE 0.44* 0.55 0.42* 0.42*  CALCIUM 9.6 9.0  --  8.8   PT/INR  Recent Labs  02/19/14 1021  LABPROT 13.4  INR 1.04   Telemetry:    I have personally reviewed telemetry today February 20, 2014. There is one episode of 4 beats of ventricular tachycardia.  Assessment/Plan  Active Problems:   Chest pain SP coronary angiography revealing 30% mid RCA and 60% diffuse distal RCA, normal left system.  EF~25%.  Ruled out for MI.  ASA    Nonischemic Cardiomyopathy. EF 25% by LV gram.  We discussed daily weights and low sodium diet and need to call the office if she gains 2# in 24 hours or 5# in a week.     Acute on chronic systolic heart failure Net fluids: -1.4L/-0.5L.  Wt stable.   Lasix at 40mg  PO BID.  Needs further diuresis.  Continue current dosing.  Repeat BNP in the AM.    Type II or unspecified type diabetes mellitus without mention of complication, uncontrolled  CBG controlled.SS insulin.   HTN  BP not well controlled. Amlodipine 5mg , Lisinopril 20.  Will increase lisinopril to 30mg .    Hypokalemia  Replace K+ 40 Meq.    Tobacco use disorder  Tobacco cessation discussed    Dyspnea  Improved.     Ventricular tachycardia     4 beats of ventricular tachycardia noted on monitor March the 31st, 2015. It will be important to be sure that the patient's potassium was maintained at 4.0 or higher.  From the cardiology viewpoint the patient can be discharged home. It is important that the potassium is maintaining 4.0 or higher.   Please arrange for the patient to be seen for cardiology followup by Dr. Candee Furbish [facility] on [facility] or one of our PA/NP team within 7-10 days. Please be sure to note that the patient needs followup for left ventricular dysfunction and 4 beats of ventricular tachycardia. The patient needs followup outpatient labs to be sure that the potassium is remaining normal.   LOS: 2 days    HAGER, BRYAN PA-C 02/20/2014 7:49 AM Patient seen and examined. I agree with the assessment and plan as detailed above. See also my additional thoughts below.   I have carefully reviewed all of the information the note above. I have personally added my thoughts to the problem list. The patient can go home, but cardiology followup is needed as outlined above.  Dola Argyle, MD, [facility] 02/20/2014 11:13 AM

## 2014-02-20 NOTE — Discharge Summary (Signed)
Name: Alicia [name] MRN: [id number] DOB: May 23, 1956 58 y.o. PCP: Lorayne Marek, MD  Date of Admission: 02/18/2014  6:08 AM Date of Discharge: 02/20/2014 Attending Physician: Dominic Pea, DO  Discharge Diagnosis: 1. Chest pain, rule out MI 2. Chronic systolic CHF 3. LLQ abdominal pain 2/2 constipation 4. DM2 5. HTN 6. Hypokalemia 7. Trichomonas  Discharge Medications:   Medication List    STOP taking these medications       hydrochlorothiazide 25 MG tablet  Commonly known as:  HYDRODIURIL      TAKE these medications       amLODipine 5 MG tablet  Commonly known as:  NORVASC  Take 1 tablet (5 mg total) by mouth daily.     aspirin 81 MG chewable tablet  Chew 1 tablet (81 mg total) by mouth daily.     furosemide 40 MG tablet  Commonly known as:  LASIX  Take 1 tablet (40 mg total) by mouth 2 (two) times daily.     insulin NPH-regular Human (70-30) 100 UNIT/ML injection  Commonly known as:  NOVOLIN 70/30  Inject 5 Units into the skin daily with breakfast.     lisinopril 30 MG tablet  Commonly known as:  PRINIVIL,ZESTRIL  Take 1 tablet (30 mg total) by mouth daily.     polyethylene glycol packet  Commonly known as:  MIRALAX / GLYCOLAX  Take 17 g by mouth daily as needed for mild constipation.     Potassium Chloride ER 20 MEQ Tbcr  Take 10 mEq by mouth 2 (two) times daily.        Disposition and follow-up:   Alicia [name] was discharged from [facility] in Good condition.  At the hospital follow up visit please address:  1.  Chest pain. Volume status. BP control?  2.  Labs / imaging needed at time of follow-up: BMP (monitor Cr, keep K>4 per cardiology), pro-BNP  3.  Pending labs/ test needing follow-up: None  Follow-up Appointments:     Follow-up Information   Follow up with Lorayne Marek, MD On 02/26/2014. (Please arrive @9am . They may call you about a different appointment time this week.)    Specialty:  Internal Medicine   Contact information:   [place] [address] [phone]       Follow up with Truitt Merle, NP On 03/02/2014. (@11 :30am. This is your heart doctor.)    Specialty:  Nurse Practitioner   Contact information:   [place]  [address] [phone]       Discharge Instructions: Discharge Orders   Future Appointments Provider Department Dept Phone   03/02/2014 11:30 AM Burtis Junes, NP [place] Office [phone]   Future Orders Complete By Expires   Call MD for:  difficulty breathing, headache or visual disturbances  As directed    Call MD for:  extreme fatigue  As directed    Call MD for:  temperature >100.4  As directed    Diet - low sodium heart healthy  As directed    Increase activity slowly  As directed       Consultations: Treatment Team:  Rounding Lbcardiology, MD  Procedures Performed:  Ct Angio Chest Pe W/cm &/or Wo Cm  02/18/2014   CLINICAL DATA:  Shortness of breath, chest pain.  EXAM: CT ANGIOGRAPHY CHEST WITH CONTRAST  TECHNIQUE: Multidetector CT imaging of the chest was performed using the standard protocol during bolus administration of intravenous contrast.  Multiplanar CT image reconstructions and MIPs were obtained to evaluate the vascular anatomy.  CONTRAST:  168mL OMNIPAQUE IOHEXOL 350 MG/ML SOLN  COMPARISON:  DG CHEST 1V PORT dated 02/18/2014  FINDINGS: There is cardiomegaly. Coronary artery calcifications in all 3 major coronary vessels. There are moderate bilateral pleural effusions. Airspace opacities in the lingula, right middle lobe and both lung bases likely reflect atelectasis. Scattered ground-glass opacities throughout the lungs likely reflect early interstitial edema. Mild vascular congestion noted.  Amorphous soft tissue density throughout the mediastinum without measurable lymph nodes. This may represent scratch head this likely is related to congestion.  No filling defects in the  pulmonary arteries to suggest pulmonary emboli. Chest wall soft tissues are unremarkable. Imaging into the upper abdomen shows no acute findings. Degenerative spurring in the mid and lower thoracic spine.  Review of the MIP images confirms the above findings.  IMPRESSION: Cardiomegaly. Moderate bilateral pleural effusions. Suspect early interstitial edema.  Patchy opacities at the lung bases involving the lower lobes, right middle lobe and lingula. Favor atelectasis although infection cannot be completely excluded.  No evidence of pulmonary embolus.   Electronically Signed   By: Rolm Baptise M.D.   On: 02/18/2014 08:24   Dg Chest Port 1 View  02/18/2014   CLINICAL DATA:  Mid chest pain. History of diabetes and hypertension.  EXAM: PORTABLE CHEST - 1 VIEW  COMPARISON:  None available for comparison at time of study interpretation.  FINDINGS: The cardiac silhouette appears mildly enlarged, mediastinal silhouette is nonsuspicious. Mild central pulmonary vasculature congestion with left greater than right lung base patchy alveolar airspace opacities. Small bilateral pleural effusions. Trachea projects midline and there is no pneumothorax.  Multiple EKG lines overlie the patient and may obscure subtle underlying pathology. Moderate degenerative change of the thoracic spine.  IMPRESSION: Mild cardiomegaly and central pulmonary vasculature congestion. Superimposed bibasilar airspace opacities with small pleural effusions, findings may reflect confluent edema or superimposed pneumonia. Recommend followup chest radiograph after treatment to verify improvement.   Electronically Signed   By: Elon Alas   On: 02/18/2014 06:47    Cardiac Cath 02/19/2014    CARDIAC CATHETERIZATION  PROCEDURE:  The patient was brought to the second floor [facility]iac cath lab in the postabsorptive state. The right radial approach was utilized after an Allen's test was positive. She was premedicated with Versed 2 mg and  fentanyl 1mcg. right radial artery was punctured via the Seldinger technique and a 5/6 glide sheath slightly was inserted without difficulty. A radial cocktail consisting of verapamil, nitroglycerin, and lidocaine was administered. A JFR 4 diagnostic cathterwas advanced over a safety J. wire to the central aorta without difficulty. Weight-based heparin 3000 units was administered. Selective angiography in the right coronary artery was performed. This catheter was exchanged for an JL 3.5 catheter and selective angiography of the left coronary system was performed. A pigtail catheter 5 [place] was then inserted for left ventriculography. The pigtail catheter was removed. A TR band was applied at 1425 at 14 cc of air for hemostasis. The patient tolerated the procedure well.  HEMODYNAMICS:  Central Aorta: 142/80  Left Ventricle: 142/21  ANGIOGRAPHY:  1. Left main: Angiographically normal and trifurcated into an LAD, a ramus intermediate vessel, and left circumflex coronary artery.  2. LAD: This is a large caliber vessel which wrapped around the LV apex to supply the distal inferior wall. The LAD gave rise to 2 major diagonal vessels and septal perforating arteries and is free of significant  disease.  3. Ramus intermediate: Angiographically normal vessel  4. Left circumflex: Angiographically normal vessel gave rise to one major marginal branch  5. Right coronary artery: There is mild calcification proximally. There was evidence for 30-40% stenosis after a marginal branch in the mid vessel. The RCA had diffuse irregularity of 60% distally proximal to the distal PD/PLA branch.  Left ventriculography revealed severe global LV dysfunction with an ejection fraction of 25%.  IMPRESSION:  Nonischemic cardiomyopathy with an ejection fraction of approximately 25% and severe diffuse global hypokinesis  Predominantly single vessel coronary artery disease with 30% mid RCA stenosis and 60% diffuse RCA stenosis with  normal appearing left coronary system consisting of the LAD, intermediate vessel, and left circumflex coronary arteries.  RECOMMENDATION:  The patient's severe LV dysfunction is out of proportion to her coronary obstructive disease. Aggressive medical therapy will be implemented in hope of potential LV function improvement.   Admission HPI:  Alicia [name] is a 58 y.o. female with PMH CHF (unknown EF), DM2, HTN, CVA who presents with a chief complaint of chest pain.   Patient reports she developed intermittent left-sided chest pain about 3 days ago. It is worse at night when she is lying down in bed, and associated with shortness of breath and a sensation of "fluttering". She describes the pain as a "fist in her chest." Denies radiation. She's never had a heart attack before, but notes that she did have an exercise stress test and cardiac catheterization in [place], [place] over 10 years ago. She thinks both of these tests were normal. Last night she also developed a cough. She endorses some chills at home, but no fever. She denies leg swelling or weight gain. She has not taken anything for the pain. She does not take a daily aspirin (was told to stop taking it), and does not have nitroglycerin glycerin at home.   She also has some left lower quadrant abdominal pain, but no nausea, vomiting, or diarrhea. She was admitted here in February 2015 with a closed loop small bowel obstruction with hernia and strangulated small bowel, and underwent exploratory laparotomy with lysis of adhesions and small bowel resection with primary anastomosis as well as repair of the hernia. Postop, she had some ileus and issues with hypertension. She notes she has had some "rib pain" since discharge. She has also had some constipation. Her last bowel movement was 2 days ago. She is passing gas.   She smokes half a pack of cigarettes per day. Denies any alcohol or drug use, including cocaine. She moved to [place] 6  months ago.   In the ED, she was given aspirin 324 mg by mouth, Lasix 20 mg IV, vancomycin and cefepime IV.   Summary of outside hospital cardiac history [facility]:  11/21/2004 - Patient presented to the emergency room in [place] with chest pain and significant EKG changes, troponin was elevated to 0.9, chest pain was nitroglycerin sensitive, after extensive counseling by cardiologist and emergency room physicians, she left [place] prior to cardiac catheterization.  12/24/04 - Patient came back to the emergency room with dyspnea on exertion and was agreeable to cath this time. She underwent left heart catheterization, left ventriculogram, coronary angiography, bilateral selective renal angiography, RFA angiography.  Impression:  1. Cardiomyopathy with severe LV dysfunction - LVEF 25% with dilated LV cavity, severe global hypokinesis, very focal akinesis of mid inferior wall  2. Single-vessel CAD with distal OM 2 stenosis (80%), likely  culprit for acute coronary syndrome several weeks ago, this also correlates with wall motion abnormality  3. Severe hypertension  4. No significant renal artery stenosis  5. No mitral regurgitation or aortic stenosis  Report also notes 40% distal luminal irregularities of the RCA. RFA has 30-40% disease in it, there is an anatomical variant with a large vessel coming off superior to the bifurcation of the RFA giving off an inferior epigastric as well as a profunda, no Perclose was performed because of unusual anatomy.  It does not appear that any intervention was done (stent, etc). Recommendations were to aggressively treat blood pressure, continue medical therapy of coronary artery disease and nonischemic cardiomyopathy.  07/23/2009 - Nuclear Lexiscan stress test negative, no changes consistent with ischemia. (Reason for test was chest pain.)  05/11/2013 - 2-D echocardiogram shows moderate concentric left  hypertrophy, moderately reduced left ventricular systolic function with an EF of 30-40%.   Physical Exam:  Blood pressure 162/93, pulse 80, temperature 97.8 F (36.6 C), temperature source Oral, resp. rate 21, height 5\' 6"  (1.676 m), weight 136 lb (61.689 kg), SpO2 92.00%.  Physical Exam  Constitutional: She is oriented to person, place, and time and well-developed, well-nourished, and in no distress.  Pleasant, conversant.  HENT:  Head: Normocephalic and atraumatic.  Eyes: Conjunctivae and EOM are normal. Pupils are equal, round, and reactive to light.  Neck: Normal range of motion. Neck supple. No JVD present.  Cardiovascular: Normal rate, regular rhythm, normal heart sounds and intact distal pulses. Exam reveals no gallop and no friction rub.  No murmur heard.  Pulmonary/Chest: Effort normal. No respiratory distress. She has no wheezes. She has rales (Crackles at bases bilaterally). She exhibits no tenderness.  Abdominal: Soft. She exhibits no distension and no mass. There is tenderness (Mildly tender to palpation in LLQ). There is no rebound and no guarding.  Well-healed midline surgical incision from exploratory laparotomy. Bowel sounds normo-active in RLQ, hypoactive in LLQ.  Musculoskeletal: Normal range of motion. She exhibits no edema and no tenderness.  Neurological: She is alert and oriented to person, place, and time. No cranial nerve deficit. GCS score is 15.  Skin: Skin is warm and dry. No rash noted. She is not diaphoretic. No erythema. No pallor.  Psychiatric: Affect normal.    Hospital Course by problem list: Alicia [name] is a 58 y.o. female with PMH CHF (unknown EF), DM2, HTN, CVA who presents with a chief complaint of chest pain.   1. Chest pain, rule out MI - Patient presented with 3 days of intermittent left-sided chest pain, nonradiating, associated with shortness of breath, worse when lying down at night. CTA was negative for pulmonary embolism, but showed  moderate bilateral pleural effusions and patchy opacities at the lung bases. EKG showed 1 mm ST elevation versus J-point elevation in V1 and V2, T-wave inversions/flattening in the inferior leads and lateral precordial leads (only latter findings were present on prior EKG dated 01/12/2014).  Risk factors for ACS included DM2, HTN, CHF, tobacco abuse. TIMI score = 2, 8% risk. Troponins were trended and negative x3. She underwent coronary angiography on 3/30 revealing predominantly single vessel coronary artery disease: 30% mid RCA and 60% diffuse distal RCA, normal left system. No stents were placed. Recommendations were to treat with aggressive medical intervention. She was started on a daily aspirin. (She told us she had been told by a previous provider not to take Aspirin, but I do not see a contraindication to this.) She was started  on IV Vancomycin and Cefepime in the ED for presumed pneumonia based on her CT scan, but her clinical presentation was not consistent with this so we did not continue antibiotics at discharge.  2. Chronic systolic CHF - Pro-BNP elevated on admission (2876). Left ventriculography on 3/30 revealed severe global LV dysfunction with an ejection fraction of 25%. This is out of proportion to her coronary obstructive disease. Aggressive medical therapy will be implemented in hope of potential LV function improvement. She was diuresed here and discharged on Lasix 40 mg po twice a day. Please repeat pro-BNP as an outpatient.  3. LLQ abdominal pain 2/2 constipation - Patient is status post exploratory laparotomy with lysis of adhesions and small bowel resection with primary anastomosis as well as repair of a hernia in 02/15. Lactate wnl here. Good BS. She had not had a BM in 3-4 days on presentation. Pain improved with MiraLax and a mineral oil enema.  4. DM2 - A1C 7.6 here. Home meds include Novolin 70/30 5 units daily with breakfast. We provided a sliding scale insulin, sensitive and  performed CBG monitoring AC and QHS.  5. HTN - Blood pressure here was elevated at 140-180s/70-90s. At home she takes HCTZ 25 mg daily and lisinopril 20 mg daily. We provided Lasix as above, increased her lisinopril to 30mg  daily, and added amlodipine 5mg  daily with good BP control at time of discharge. Aggressive management of this in the outpatient setting will be needed.  6. Hypokalemia - K 3.5 this morning. We provided K-dur 48meq x1. She had a 4-beat run of VT on telemetry on 3/31, so cardiology recommends keeping her K>4.0. We discharged her on K-Dur 55meq BID and gave literature on potassium rich foods.  7. Trichomonas - Noted on urinalysis. She got Flagyl 1 g IV x2.   Discharge Vitals:   BP 142/76  Pulse 80  Temp(Src) 98.3 F (36.8 C) (Oral)  Resp 18  Ht 5\' 6"  (1.676 m)  Wt 136 lb 9.6 oz (61.961 kg)  BMI 22.06 kg/m2  SpO2 96%  Discharge Labs:  Results for orders placed during the hospital encounter of 02/18/14 (from the past 24 hour(s))  GLUCOSE, CAPILLARY     Status: Abnormal   Collection Time    02/19/14  3:53 PM      Result Value Ref Range   Glucose-Capillary 109 (*) 70 - 99 mg/dL  CBC     Status: Abnormal   Collection Time    02/19/14  4:15 PM      Result Value Ref Range   WBC 5.0  4.0 - 10.5 K/uL   RBC 4.13  3.87 - 5.11 MIL/uL   Hemoglobin 11.1 (*) 12.0 - 15.0 g/dL   HCT 33.7 (*) 36.0 - 46.0 %   MCV 81.6  78.0 - 100.0 fL   MCH 26.9  26.0 - 34.0 pg   MCHC 32.9  30.0 - 36.0 g/dL   RDW 14.5  11.5 - 15.5 %   Platelets 236  150 - 400 K/uL  CREATININE, SERUM     Status: Abnormal   Collection Time    02/19/14  4:15 PM      Result Value Ref Range   Creatinine, Ser 0.42 (*) 0.50 - 1.10 mg/dL   GFR calc non Af Amer >90  >90 mL/min   GFR calc Af Amer >90  >90 mL/min  GLUCOSE, CAPILLARY     Status: Abnormal   Collection Time    02/19/14  8:35 PM  Result Value Ref Range   Glucose-Capillary 213 (*) 70 - 99 mg/dL   Comment 1 Documented in Chart     Comment 2  Notify RN    BASIC METABOLIC PANEL     Status: Abnormal   Collection Time    02/20/14  4:46 AM      Result Value Ref Range   Sodium 136 (*) 137 - 147 mEq/L   Potassium 3.5 (*) 3.7 - 5.3 mEq/L   Chloride 95 (*) 96 - 112 mEq/L   CO2 28  19 - 32 mEq/L   Glucose, Bld 101 (*) 70 - 99 mg/dL   BUN 14  6 - 23 mg/dL   Creatinine, Ser 0.42 (*) 0.50 - 1.10 mg/dL   Calcium 8.8  8.4 - 10.5 mg/dL   GFR calc non Af Amer >90  >90 mL/min   GFR calc Af Amer >90  >90 mL/min  CBC     Status: Abnormal   Collection Time    02/20/14  4:46 AM      Result Value Ref Range   WBC 5.8  4.0 - 10.5 K/uL   RBC 4.01  3.87 - 5.11 MIL/uL   Hemoglobin 10.9 (*) 12.0 - 15.0 g/dL   HCT 32.6 (*) 36.0 - 46.0 %   MCV 81.3  78.0 - 100.0 fL   MCH 27.2  26.0 - 34.0 pg   MCHC 33.4  30.0 - 36.0 g/dL   RDW 14.4  11.5 - 15.5 %   Platelets 244  150 - 400 K/uL  GLUCOSE, CAPILLARY     Status: Abnormal   Collection Time    02/20/14  6:39 AM      Result Value Ref Range   Glucose-Capillary 123 (*) 70 - 99 mg/dL   Comment 1 Documented in Chart     Comment 2 Notify RN    GLUCOSE, CAPILLARY     Status: Abnormal   Collection Time    02/20/14 10:56 AM      Result Value Ref Range   Glucose-Capillary 146 (*) 70 - 99 mg/dL    Signed: Lesly Dukes, MD 02/20/2014, 2:08 PM   Time Spent on Discharge: 35 minutes Services Ordered on Discharge: None Equipment Ordered on Discharge: None

## 2014-02-20 NOTE — Progress Notes (Signed)
Pt given d/c instructions at this time; IV and tele monitor d/c at this time; daughter to d/c pt home.

## 2014-02-20 NOTE — Discharge Instructions (Signed)
It was a pleasure taking care of you. - You have heart failure, which means you have a weak heart that does not pump normally. This can lead to fluid build up in the lungs and shortness of breath. - Please take your Lasix twice a day to help prevent fluid build up. - Please also take a baby aspirin every day. - It is important that we control your blood pressure going forward to help you heart perform at its best.  - Please start taking Amlodipine and increase your dose of Lisinpril. Please stop taking HCTZ for now. These medications may need to be adjusted by your primary doctor in the clinic. - Your potassium was low here. Please increase your intake of potassium rich foods (I have provided information on this). We will also prescribe a low-dose potassium supplement for you. - If you would like to switch to our Internal Medicine clinic here at [facility], you first need to see your establish doctor at the [facility] and request to have your records transferred. Please follow up with him (Dr. Annitta Needs) to discuss this. We recommend you see him late this week or early next week for hospital follow up. I have called and left a message for them to contact you about an appointment. - If you develop recurrent shortness of breath, chest pain, please call your doctor or return to the ED.

## 2014-02-20 NOTE — Progress Notes (Signed)
Pt up ambulating in hallway; steady gait noted; no needs voiced; will cont. To monitor.

## 2014-02-20 NOTE — Progress Notes (Addendum)
Subjective: Patient seen and examined at the bedside this morning. No chest pain. She tolerated her cath well yesterday with no complications. She remains constipated this am with LLQ tenderness. Her tried to have a BM this am, but only a small amount of hard stool came out.  Objective: Vital signs in last 24 hours: Filed Vitals:   02/19/14 2029 02/20/14 0422 02/20/14 0639 02/20/14 0825  BP: 144/75 163/88  142/76  Pulse: 80 80  80  Temp: 98.3 F (36.8 C) 98.3 F (36.8 C)    TempSrc: Oral Oral    Resp: 18 18    Height:      Weight:   136 lb 9.6 oz (61.961 kg)   SpO2: 99% 96%     Weight change: -9.6 oz (-0.272 kg)  Intake/Output Summary (Last 24 hours) at 02/20/14 0847 Last data filed at 02/20/14 6283  Gross per 24 hour  Intake   2070 ml  Output   2401 ml  Net   -331 ml   Physical Exam  Constitutional: She is oriented to person, place, and time and well-developed, well-nourished, and in no distress.  Pleasant, conversant.  HENT:  Head: Normocephalic and atraumatic.  Eyes: Conjunctivae and EOM are normal. Pupils are equal, round, and reactive to light.  Neck: Normal range of motion. Neck supple. No JVD present.  Cardiovascular: Normal rate, regular rhythm, normal heart sounds and intact distal pulses. Exam reveals no gallop and no friction rub.  No murmur heard.  Pulmonary/Chest: Effort normal. No respiratory distress. She has no wheezes. She has rales (Crackles at bases bilaterally). She exhibits no tenderness.  Abdominal: Soft. She exhibits no distension and no mass. There is tenderness (Tender to palpation in LLQ). There is no rebound and no guarding.  Well-healed midline surgical incision from exploratory laparotomy. Bowel sounds normo-active. Musculoskeletal: Normal range of motion. She exhibits no edema and no tenderness.  Neurological: She is alert and oriented to person, place, and time. No cranial nerve deficit. GCS score is 15.  Skin: Skin is warm and dry. No rash  noted. She is not diaphoretic. No erythema. No pallor.  Psychiatric: Affect normal.   Lab Results: Basic Metabolic Panel:  Recent Labs Lab 02/19/14 0251 02/19/14 1615 02/20/14 0446  NA 135*  --  136*  K 3.8  --  3.5*  CL 94*  --  95*  CO2 29  --  28  GLUCOSE 151*  --  101*  BUN 19  --  14  CREATININE 0.55 0.42* 0.42*  CALCIUM 9.0  --  8.8   Liver Function Tests:  Recent Labs Lab 02/18/14 0649  AST 21  ALT 18  ALKPHOS 70  BILITOT 0.5  PROT 6.5  ALBUMIN 3.3*   No results found for this basename: LIPASE, AMYLASE,  in the last 168 hours No results found for this basename: AMMONIA,  in the last 168 hours CBC:  Recent Labs Lab 02/19/14 1615 02/20/14 0446  WBC 5.0 5.8  HGB 11.1* 10.9*  HCT 33.7* 32.6*  MCV 81.6 81.3  PLT 236 244   Cardiac Enzymes:  Recent Labs Lab 02/18/14 1004 02/18/14 1532 02/19/14 0251  TROPONINI <0.30 <0.30 <0.30   BNP:  Recent Labs Lab 02/18/14 0648  PROBNP 2876.0*   D-Dimer: No results found for this basename: DDIMER,  in the last 168 hours CBG:  Recent Labs Lab 02/19/14 0420 02/19/14 0758 02/19/14 1149 02/19/14 1553 02/19/14 2035 02/20/14 0639  GLUCAP 142* 115* 117* 109* 213* 123*  Hemoglobin A1C:  Recent Labs Lab 02/18/14 1250  HGBA1C 7.6*   Fasting Lipid Panel: No results found for this basename: CHOL, HDL, LDLCALC, TRIG, CHOLHDL, LDLDIRECT,  in the last 168 hours Thyroid Function Tests:  Recent Labs Lab 02/18/14 1250  TSH 1.032   Coagulation:  Recent Labs Lab 02/19/14 1021  LABPROT 13.4  INR 1.04   Anemia Panel: No results found for this basename: VITAMINB12, FOLATE, FERRITIN, TIBC, IRON, RETICCTPCT,  in the last 168 hours Urine Drug Screen: Drugs of Abuse     Component Value Date/Time   LABOPIA NONE DETECTED 02/19/2014 0424   COCAINSCRNUR NONE DETECTED 02/19/2014 0424   LABBENZ NONE DETECTED 02/19/2014 0424   AMPHETMU NONE DETECTED 02/19/2014 0424   THCU NONE DETECTED 02/19/2014 0424    LABBARB NONE DETECTED 02/19/2014 0424    Alcohol Level: No results found for this basename: ETH,  in the last 168 hours Urinalysis:  Recent Labs Lab 02/18/14 0715  COLORURINE YELLOW  LABSPEC 1.018  PHURINE 7.0  GLUCOSEU NEGATIVE  HGBUR NEGATIVE  BILIRUBINUR NEGATIVE  KETONESUR NEGATIVE  PROTEINUR NEGATIVE  UROBILINOGEN 0.2  NITRITE NEGATIVE  LEUKOCYTESUR LARGE*    Micro Results: Recent Results (from the past 240 hour(s))  MRSA PCR SCREENING     Status: Abnormal   Collection Time    02/18/14  3:25 PM      Result Value Ref Range Status   MRSA by PCR POSITIVE (*) NEGATIVE Final   Comment:            The GeneXpert MRSA Assay (FDA     approved for NASAL specimens     only), is one component of a     comprehensive MRSA colonization     surveillance program. It is not     intended to diagnose MRSA     infection nor to guide or     monitor treatment for     MRSA infections.     RESULT CALLED TO, READ BACK BY AND VERIFIED WITH:     CARTER,D RN 02/18/14 [place]   Studies/Results: No results found. Medications: I have reviewed the patient's current medications. Scheduled Meds: . amLODipine  5 mg Oral Daily  . aspirin  324 mg Oral Daily  . furosemide  40 mg Oral BID  . heparin  5,000 Units Subcutaneous 3 times per day  . insulin aspart  0-9 Units Subcutaneous TID AC & HS  . lisinopril  30 mg Oral Daily  . mineral oil  1 enema Rectal Once  . sodium chloride  3 mL Intravenous Q12H  . sodium chloride  3 mL Intravenous Q12H   Continuous Infusions: . sodium chloride Stopped (02/20/14 0400)  . sodium chloride Stopped (02/20/14 0400)   PRN Meds:.sodium chloride, acetaminophen, ondansetron (ZOFRAN) IV, ondansetron, polyethylene glycol, sodium chloride  Assessment/Plan: Alicia [name] is a 58 y.o. female with PMH CHF (unknown EF), DM2, HTN, CVA who presents with a chief complaint of chest pain.   #Chest pain, rule out MI - Troponins were negative x3. No overnight  events on telemetry. She is s/p coronary angiography on 3/30 revealing predominantly single vessel coronary artery disease: 30% mid RCA and 60% diffuse distal RCA, normal left system. - Appreciate cardiology recs - ASA 324mg  daily - Oxygen therapy to keep O2 sats greater than 90%  - Zofran when necessary for nausea  - Medically stable for discharge home with close [facility] follow up   #Chronic systolic CHF - Left ventriculography yesterday revealed severe  global LV dysfunction with an ejection fraction of 25%. This is out of proportion to her coronary obstructive disease. Aggressive medical therapy will be implemented in hope of potential LV function improvement. - Lasix 40 mg po twice a day  - Daily weights > 136 > 135 > 136 - Strict ins and outs > UOP 1900cc yesterday, net +549 since admission - Repeat pro-BNP as outpatient  #LLQ abdominal pain 2/2 constipation - Patient is status post exploratory laparotomy with lysis of adhesions and small bowel resection with primary anastomosis as well as repair of a hernia in 02/15. Discomfort is likely secondary to constipation, she has not had a real BM in 3-4 days. Lactate wnl. Good BS. - Mineral oil enema x1 - MiraLAX 17 g daily   #DM2 - A1C 7.6 here. Home meds include Novolin 70/30 5 units daily with breakfast.  - Sliding scale insulin, sensitive  - CBG monitoring AC and QHS  #HTN - Blood pressure is elevated at 140-180s/70-90s. At home she takes HCTZ 25 mg daily and lisinopril 20 mg daily.  - Lasix as above  - Increasing lisinopril to 30mg  daily - Adding amlodipine 5mg  daily  #Hypokalemia - K 3.5 this morning. - K-dur 96meq x1 - Continue to monitor  #Trichomonas - Noted on urinalysis. She is s/p Flagyl 1 g IV x2.  #DVT PPX - Heparin subcutaneous   Dispo: Disposition is deferred at this time, awaiting improvement of current medical problems.  Anticipated discharge in approximately 0-1 day(s).   The patient does have a current PCP (PAtient  would like to follow up in [facility]) and does need an [facility] hospital follow-up appointment after discharge.  The patient does not have transportation limitations that hinder transportation to clinic appointments.  .Services Needed at time of discharge: Y = Yes, Blank = No PT:   OT:   RN:   Equipment:   Other:     LOS: 2 days   Lesly Dukes, MD 02/20/2014, 8:47 AM  Lesly Dukes, MD  [email or web address] Pager # [phone] Office # [phone]

## 2014-02-21 ENCOUNTER — Encounter: Payer: Self-pay | Admitting: Cardiovascular Disease

## 2014-02-22 NOTE — Discharge Summary (Signed)
  Date: 02/22/2014  Patient name: Alicia [name]  Medical record number: [id number]  Date of birth: [birth year]   This patient has been seen and the plan of care was discussed with the house staff. Please see their note for complete details. I concur with their findings and plan.  Dominic Pea, DO, [facility] 02/22/2014, 9:56 AM

## 2014-02-27 ENCOUNTER — Encounter: Payer: Self-pay | Admitting: Internal Medicine

## 2014-02-27 ENCOUNTER — Ambulatory Visit: Payer: Medicaid Other | Attending: Internal Medicine | Admitting: Internal Medicine

## 2014-02-27 VITALS — BP 142/83 | HR 84 | Temp 98.8°F | Resp 16 | Wt 136.6 lb

## 2014-02-27 DIAGNOSIS — K5900 Constipation, unspecified: Secondary | ICD-10-CM | POA: Insufficient documentation

## 2014-02-27 DIAGNOSIS — F17200 Nicotine dependence, unspecified, uncomplicated: Secondary | ICD-10-CM

## 2014-02-27 DIAGNOSIS — I10 Essential (primary) hypertension: Secondary | ICD-10-CM | POA: Insufficient documentation

## 2014-02-27 DIAGNOSIS — R079 Chest pain, unspecified: Secondary | ICD-10-CM | POA: Insufficient documentation

## 2014-02-27 DIAGNOSIS — I5023 Acute on chronic systolic (congestive) heart failure: Secondary | ICD-10-CM | POA: Insufficient documentation

## 2014-02-27 DIAGNOSIS — E876 Hypokalemia: Secondary | ICD-10-CM | POA: Insufficient documentation

## 2014-02-27 LAB — COMPLETE METABOLIC PANEL WITH GFR
ALT: 17 U/L (ref 0–35)
AST: 13 U/L (ref 0–37)
Albumin: 4.3 g/dL (ref 3.5–5.2)
Alkaline Phosphatase: 60 U/L (ref 39–117)
BILIRUBIN TOTAL: 0.4 mg/dL (ref 0.2–1.2)
BUN: 14 mg/dL (ref 6–23)
CO2: 31 mEq/L (ref 19–32)
Calcium: 10.1 mg/dL (ref 8.4–10.5)
Chloride: 92 mEq/L — ABNORMAL LOW (ref 96–112)
Creat: 0.44 mg/dL — ABNORMAL LOW (ref 0.50–1.10)
GFR, Est Non African American: >89 mL/min
Glucose, Bld: 92 mg/dL (ref 70–99)
Potassium: 4.4 mEq/L (ref 3.5–5.3)
SODIUM: 133 meq/L — AB (ref 135–145)
Total Protein: 6.9 g/dL (ref 6.0–8.3)

## 2014-02-27 NOTE — Progress Notes (Signed)
MRN: [id number] Name: Alicia [name]  Sex: female Age: 58 y.o. DOB: March 07, 1956  Allergies: Diona Fanti and Other  Chief Complaint  Patient presents with  . Hospitalization Follow-up    HPI: Patient is 58 y.o. female who has History of CHF diabetes hypertension, She was recently hospitalized last month with symptoms of chest pain, EMR reviewed, patient had corrected done reported to have nonischemic cardiomyopathy with ejection fraction of 25% with a diffuse global hypokinesis predominantly single-vessel carotid disease with 30% mid RCA and 60% diffuse of systems with normal appearing left her coronary heart system, patient was recommended to have aggressive medical therapy, her medications were optimized, hydrochlorothiazide was discontinued and her lisinopril was increased to 30 mg daily and she's also taking Lasix., She denies any orthopnea PND or leg swelling, she is scheduled to follow with her cardiologist, patient also noted to have low potassium and she has taken supplement, patient currently denies any chest pain, she is trying to quit smoking.  Past Medical History  Diagnosis Date  . Diabetes mellitus, type II   . Hypertension   . H/O: CVA (cerebrovascular accident)     6 strokes, most recent on 05/10/13  . CHF (congestive heart failure)   . Stroke     Past Surgical History  Procedure Laterality Date  . Tubal ligation    . Cardiac catheterization    . Laparotomy N/A 01/15/2014    Procedure: EXPLORATORY LAPAROTOMY SMALL BOWEL RESECTION;  Surgeon: Imogene Burn. Georgette Dover, MD;  Location: [place];  Service: General;  Laterality: N/A;  . Lysis of adhesion N/A 01/15/2014    Procedure: LYSIS OF ADHESION;  Surgeon: Imogene Burn. Georgette Dover, MD;  Location: [place];  Service: General;  Laterality: N/A;      Medication List       This list is accurate as of: 02/27/14 11:45 AM.  Always use your most recent med list.               amLODipine 5 MG tablet  Commonly known as:  NORVASC  Take 1 tablet (5  mg total) by mouth daily.     aspirin 81 MG chewable tablet  Chew 1 tablet (81 mg total) by mouth daily.     furosemide 40 MG tablet  Commonly known as:  LASIX  Take 1 tablet (40 mg total) by mouth 2 (two) times daily.     insulin NPH-regular Human (70-30) 100 UNIT/ML injection  Commonly known as:  NOVOLIN 70/30  Inject 5 Units into the skin daily with breakfast.     lisinopril 30 MG tablet  Commonly known as:  PRINIVIL,ZESTRIL  Take 1 tablet (30 mg total) by mouth daily.     polyethylene glycol packet  Commonly known as:  MIRALAX / GLYCOLAX  Take 17 g by mouth daily as needed for mild constipation.     Potassium Chloride ER 20 MEQ Tbcr  Take 10 mEq by mouth 2 (two) times daily.        No orders of the defined types were placed in this encounter.    Immunization History  Administered Date(s) Administered  . Influenza,inj,Quad PF,36+ Mos 10/16/2013    Family History  Problem Relation Age of Onset  . Diabetes Mellitus II Mother   . Hypertension Mother   . CVA Mother     History  Substance Use Topics  . Smoking status: Current Every Day Smoker -- 0.50 packs/day for 25 years    Types: Cigarettes  . Smokeless tobacco: Never  Used  . Alcohol Use: No    Review of Systems   As noted in HPI  Filed Vitals:   02/27/14 1129  BP: 142/83  Pulse: 84  Temp: 98.8 F (37.1 C)  Resp: 16    Physical Exam  Physical Exam  Constitutional: No distress.  Eyes: EOM are normal. Pupils are equal, round, and reactive to light.  Cardiovascular: Normal rate and regular rhythm.   Pulmonary/Chest: Breath sounds normal. No respiratory distress. She has no wheezes. She has no rales.  Abdominal: Soft. There is no tenderness. There is no rebound.  Musculoskeletal: She exhibits no edema.    CBC    Component Value Date/Time   WBC 5.8 02/20/2014 0446   RBC 4.01 02/20/2014 0446   HGB 10.9* 02/20/2014 0446   HCT 32.6* 02/20/2014 0446   PLT 244 02/20/2014 0446   MCV 81.3 02/20/2014  0446   LYMPHSABS 1.2 01/14/2014 0917   MONOABS 0.9 01/14/2014 0917   EOSABS 0.0 01/14/2014 0917   BASOSABS 0.0 01/14/2014 0917    CMP     Component Value Date/Time   NA 136* 02/20/2014 0446   K 3.5* 02/20/2014 0446   CL 95* 02/20/2014 0446   CO2 28 02/20/2014 0446   GLUCOSE 101* 02/20/2014 0446   BUN 14 02/20/2014 0446   CREATININE 0.42* 02/20/2014 0446   CREATININE 0.46* 11/01/2013 1238   CALCIUM 8.8 02/20/2014 0446   PROT 6.5 02/18/2014 0649   ALBUMIN 3.3* 02/18/2014 0649   AST 21 02/18/2014 0649   ALT 18 02/18/2014 0649   ALKPHOS 70 02/18/2014 0649   BILITOT 0.5 02/18/2014 0649   GFRNONAA >90 02/20/2014 0446   GFRNONAA >89 11/01/2013 1238   GFRAA >90 02/20/2014 0446   GFRAA >89 11/01/2013 1238    No results found for this basename: chol, tri, ldl    No components found with this basename: hga1c    Lab Results  Component Value Date/Time   AST 21 02/18/2014  6:49 AM    Assessment and Plan  Chest pain/Acute on chronic systolic heart failure  Patient is currently on aspirin Lasix with potassium supplement Patient to follow with her cardiologist.  Hypertension - Plan: Blood pressure is improving will repeat blood chemistry COMPLETE METABOLIC PANEL WITH GFR  Hypokalemia - Plan: Patient is taking potassium supplement will repeat blood chemistry COMPLETE METABOLIC PANEL WITH GFR  Tobacco use disorder Encouraged patient to quit smoking.  Unspecified constipation Improved  .  Return in about 3 months (around 05/29/2014) for hypertension, diabetes.  Lorayne Marek, MD

## 2014-02-27 NOTE — Patient Instructions (Signed)
DASH Diet  The DASH diet stands for "Dietary Approaches to Stop Hypertension." It is a healthy eating plan that has been shown to reduce high blood pressure (hypertension) in as little as 14 days, while also possibly providing other significant health benefits. These other health benefits include reducing the risk of breast cancer after menopause and reducing the risk of type 2 diabetes, heart disease, colon cancer, and stroke. Health benefits also include weight loss and slowing kidney failure in patients with chronic kidney disease.   DIET GUIDELINES  · Limit salt (sodium). Your diet should contain less than 1500 mg of sodium daily.  · Limit refined or processed carbohydrates. Your diet should include mostly whole grains. Desserts and added sugars should be used sparingly.  · Include small amounts of heart-healthy fats. These types of fats include nuts, oils, and tub margarine. Limit saturated and trans fats. These fats have been shown to be harmful in the body.  CHOOSING FOODS   The following food groups are based on a 2000 calorie diet. See your Registered Dietitian for individual calorie needs.  Grains and Grain Products (6 to 8 servings daily)  · Eat More Often: Whole-wheat bread, brown rice, whole-grain or wheat pasta, quinoa, popcorn without added fat or salt (air popped).  · Eat Less Often: White bread, white pasta, white rice, cornbread.  Vegetables (4 to 5 servings daily)  · Eat More Often: Fresh, frozen, and canned vegetables. Vegetables may be raw, steamed, roasted, or grilled with a minimal amount of fat.  · Eat Less Often/Avoid: Creamed or fried vegetables. Vegetables in a cheese sauce.  Fruit (4 to 5 servings daily)  · Eat More Often: All fresh, canned (in natural juice), or frozen fruits. Dried fruits without added sugar. One hundred percent fruit juice (½ cup [237 mL] daily).  · Eat Less Often: Dried fruits with added sugar. Canned fruit in light or heavy syrup.  Lean Meats, Fish, and Poultry (2  servings or less daily. One serving is 3 to 4 oz [85-114 g]).  · Eat More Often: Ninety percent or leaner ground beef, tenderloin, sirloin. Round cuts of beef, chicken breast, turkey breast. All fish. Grill, bake, or broil your meat. Nothing should be fried.  · Eat Less Often/Avoid: Fatty cuts of meat, turkey, or chicken leg, thigh, or wing. Fried cuts of meat or fish.  Dairy (2 to 3 servings)  · Eat More Often: Low-fat or fat-free milk, low-fat plain or light yogurt, reduced-fat or part-skim cheese.  · Eat Less Often/Avoid: Milk (whole, 2%). Whole milk yogurt. Full-fat cheeses.  Nuts, Seeds, and Legumes (4 to 5 servings per week)  · Eat More Often: All without added salt.  · Eat Less Often/Avoid: Salted nuts and seeds, canned beans with added salt.  Fats and Sweets (limited)  · Eat More Often: Vegetable oils, tub margarines without trans fats, sugar-free gelatin. Mayonnaise and salad dressings.  · Eat Less Often/Avoid: Coconut oils, palm oils, butter, stick margarine, cream, half and half, cookies, candy, pie.  FOR MORE INFORMATION  The Dash Diet Eating Plan: www.dashdiet.org  Document Released: 10/29/2011 Document Revised: 02/01/2012 Document Reviewed: 10/29/2011  [employer]® Patient Information ©2014 [employer].

## 2014-02-27 NOTE — Progress Notes (Signed)
Pt here for a hospital follow up. Pt has no pain Has medication changes post hospital follow up

## 2014-02-28 ENCOUNTER — Telehealth: Payer: Self-pay

## 2014-02-28 NOTE — Telephone Encounter (Signed)
Message copied by Dorothe Pea on Wed Feb 28, 2014 11:50 AM ------      Message from: Lorayne Marek      Created: Wed Feb 28, 2014  9:22 AM       Call and let her that her Patient know that her potassium level is within normal range, she should continue with potassium supplement as she is on Lasix. ------

## 2014-02-28 NOTE — Telephone Encounter (Signed)
Left message on voice mail Her lab results were normal and to continue her potassium

## 2014-03-02 ENCOUNTER — Encounter: Payer: Medicaid Other | Admitting: Nurse Practitioner

## 2014-03-02 ENCOUNTER — Telehealth: Payer: Self-pay

## 2014-03-02 NOTE — Telephone Encounter (Signed)
Spoke with patient about her lab results She is aware to continue with her potassium supplements

## 2014-03-05 ENCOUNTER — Encounter: Payer: Self-pay | Admitting: Physician Assistant

## 2014-03-05 ENCOUNTER — Ambulatory Visit (INDEPENDENT_AMBULATORY_CARE_PROVIDER_SITE_OTHER): Payer: Medicaid Other | Admitting: Physician Assistant

## 2014-03-05 VITALS — BP 150/80 | HR 74 | Ht 66.0 in | Wt 138.0 lb

## 2014-03-05 DIAGNOSIS — F17200 Nicotine dependence, unspecified, uncomplicated: Secondary | ICD-10-CM

## 2014-03-05 DIAGNOSIS — I10 Essential (primary) hypertension: Secondary | ICD-10-CM

## 2014-03-05 DIAGNOSIS — IMO0001 Reserved for inherently not codable concepts without codable children: Secondary | ICD-10-CM

## 2014-03-05 DIAGNOSIS — I5022 Chronic systolic (congestive) heart failure: Secondary | ICD-10-CM

## 2014-03-05 DIAGNOSIS — I2510 Atherosclerotic heart disease of native coronary artery without angina pectoris: Secondary | ICD-10-CM

## 2014-03-05 DIAGNOSIS — E1165 Type 2 diabetes mellitus with hyperglycemia: Secondary | ICD-10-CM

## 2014-03-05 DIAGNOSIS — I5023 Acute on chronic systolic (congestive) heart failure: Secondary | ICD-10-CM

## 2014-03-05 DIAGNOSIS — I428 Other cardiomyopathies: Secondary | ICD-10-CM

## 2014-03-05 MED ORDER — PRAVASTATIN SODIUM 40 MG PO TABS: 40.0000 mg | ORAL_TABLET | Freq: Every evening | ORAL | Status: DC

## 2014-03-05 MED ORDER — SIMVASTATIN 20 MG PO TABS
20.0000 mg | ORAL_TABLET | Freq: Every day | ORAL | Status: DC
Start: 2014-03-05 — End: 2014-03-22

## 2014-03-05 MED ORDER — CARVEDILOL 3.125 MG PO TABS: 3.1250 mg | ORAL_TABLET | Freq: Two times a day (BID) | ORAL | Status: DC

## 2014-03-05 NOTE — Progress Notes (Signed)
RECEIVED [facility] FROM [facility]/ THEY DO NOT CARRY PRAVACHOL,  SCOTT WEAVER PA-C GAVE OK TO SWITCH TO SIMVASTATIN 20 MG DAILY.

## 2014-03-05 NOTE — Progress Notes (Signed)
[place], [place], [place]  [address] Phone: [phone] Fax:  [phone]  Date:  03/05/2014   ID:  Alicia [name], DOB 1956-10-05, MRN [id number]  PCP:  Lorayne Marek, MD  Cardiologist:  Dr. Candee Furbish      History of Present Illness: Alicia [name] is a 58 y.o. female with a history of cardiomyopathy, systolic CHF, diabetes, HTN and prior stroke. She was admitted in 12/2013 with small bowel obstruction secondary to closed-loop small bowel hernia with strangulated bowel. She underwent exploratory laparotomy with adhesiolysis and small bowel resection with primary anastomosis. She was then admitted 3/29-3/31 with chest pain and a/c systolic CHF. Chest CTA was negative for pulmonary embolism but did show bilateral pleural effusions. Cardiac enzymes remained normal. She was diuresed for volume excess. She was seen by cardiology. Patient had a prior evaluation in [place]. Cardiac catheterization there in 2006 (after presenting with CP and minimal troponin elevation) demonstrated distal OM2 stenosis of 80%. Nuclear stress test in 06/2009 demonstrated no ischemia. Echocardiogram in 04/2013 demonstrated an EF of 30-40%. She was noted to have some anterior ST changes on ECG. Given her symptoms and risk factors, it was felt she needed further evaluation with cardiac catheterization in light of reduced ejection fraction. LHC demonstrated single-vessel disease with 30-40% mid and 60% distal RCA stenosis. Her cardiomyopathy was felt to be nonischemic.    She returns for follow up. She is doing well. She denies chest pain, significant dyspnea, orthopnea, PND or edema. She denies syncope. She is compliant with all her medications.  Studies:  - LHC (02/19/14):  Mid RCA 30-40, distal RCA 60, EF 25%.  - Echo (05/11/13): Moderate LVH, EF 30-40%.  - Nuclear (07/23/09 - in [place]):  No ischemia.   Recent Labs: 02/18/2014: Pro B Natriuretic peptide (BNP) 2876.0*; TSH 1.032  02/20/2014:  Hemoglobin 10.9*  02/27/2014: ALT 17; Creatinine 0.44*; Potassium 4.4   Wt Readings from Last 3 Encounters:  03/05/14 138 lb (62.596 kg)  02/27/14 136 lb 9.6 oz (61.961 kg)  02/20/14 136 lb 9.6 oz (61.961 kg)     Past Medical History  Diagnosis Date  . Diabetes mellitus, type II   . Hypertension   . H/O: CVA (cerebrovascular accident)     6 strokes, most recent on 05/10/13  . CHF (congestive heart failure)   . Stroke     Current Outpatient Prescriptions  Medication Sig Dispense Refill  . amLODipine (NORVASC) 5 MG tablet Take 1 tablet (5 mg total) by mouth daily.  30 tablet  0  . aspirin 81 MG chewable tablet Chew 1 tablet (81 mg total) by mouth daily.  30 tablet  0  . furosemide (LASIX) 40 MG tablet Take 1 tablet (40 mg total) by mouth 2 (two) times daily.  60 tablet  0  . insulin NPH-regular Human (NOVOLIN 70/30) (70-30) 100 UNIT/ML injection Inject 5 Units into the skin daily with breakfast.      . lisinopril (PRINIVIL,ZESTRIL) 30 MG tablet Take 1 tablet (30 mg total) by mouth daily.  30 tablet  0  . polyethylene glycol (MIRALAX / GLYCOLAX) packet Take 17 g by mouth daily as needed for mild constipation.  14 each  0  . potassium chloride 20 MEQ TBCR Take 10 mEq by mouth 2 (two) times daily.  60 tablet  0   No current facility-administered medications for this visit.    Allergies:   Asa and Other   Social History:  The patient  reports that she has been smoking Cigarettes.  She has a 12.5 pack-year smoking history. She has never used smokeless tobacco. She reports that she does not drink alcohol or use illicit drugs.   Family History:  The patient's family history includes CVA in her mother; Diabetes Mellitus II in her mother; Hypertension in her mother.   ROS:  Please see the history of present illness.   She has occasional diarrhea. She denies vomiting, melena, hematochezia.   All other systems reviewed and negative.   PHYSICAL EXAM: VS:  BP 150/80  Pulse 74  Ht 5\' 6"   (1.676 m)  Wt 138 lb (62.596 kg)  BMI 22.28 kg/m2 Well nourished, well developed, in no acute distress HEENT: normal Neck: no JVD Cardiac:  normal S1, S2; RRR; no murmur Lungs:  clear to auscultation bilaterally, no wheezing, rhonchi or rales Abd: soft, nontender, no hepatomegaly Ext: no edemaright wrist without hematoma or mass Skin: warm and dry Neuro:  CNs 2-12 intact, no focal abnormalities noted  EKG:  NSR, HR 74, LAD, septal Q waves, T wave inversions in 1 aVL, V5-V6   ASSESSMENT AND PLAN:  1. Chronic Systolic CHF: Volume remains stable. Recent basic metabolic panel done with primary care demonstrated stable potassium and renal function. 2. Nonischemic Cardiomyopathy:  Continue ACEI. She is not on a beta blocker for unclear reasons. I have reviewed her chart. She denies intolerance to beta blockers in the past. She denies history of cocaine use. Toxicology in the hospital was negative for cocaine. She is not bradycardic. She does not have significant asthma. I will place her on carvedilol 3.125 mg twice a day. She will be brought back in close follow up for medication titration. Consider adding spironolactone (would be able to discontinue potassium) in the future. I will arrange a follow up echocardiogram in 3 months. If her EF remains less than 30%, refer to EP for possible ICD. 3. CAD: No angina. Continue aspirin. Start pravastatin 40 mg each bedtime. Arranged lipids and LFTs in 6-8 weeks. 4. Hypertension: Blood pressure is above target. Adjust medications as noted for nonischemic cardiomyopathy. 5. Diabetes: Continue follow up with primary care. 6. Prior stroke: Continue aspirin. 7. Tobacco Abuse: She is in the process of quitting. 8. Disposition: Follow up with me in 2 weeks. Arrange follow up with Dr. Marlou Porch in 2 months.  Signed, Richardson Dopp, PA-C  03/05/2014 9:09 AM

## 2014-03-05 NOTE — Patient Instructions (Signed)
Your physician has requested that you have an echocardiogram AFTER July 1ST 2015. Echocardiography is a painless test that uses sound waves to create images of your heart. It provides your doctor with information about the size and shape of your heart and how well your heart's chambers and valves are working. This procedure takes approximately one hour. There are no restrictions for this procedure.  Your physician recommends that you return for a FASTING lipid profile: 6 WEEKS LIPID/ LIVER  Your physician recommends that you schedule a follow-up appointment in: [place] PA-C  Your physician recommends that you schedule a follow-up appointment in: [place] has recommended you make the following change in your medication:  START CARVEDILOL 3.125 MG TWICE DAILY 12 HOURS APART START PRAVACHOL 40 MG EACH EVENING AFTER DINNER.

## 2014-03-20 ENCOUNTER — Telehealth: Payer: Self-pay | Admitting: Physician Assistant

## 2014-03-20 NOTE — Telephone Encounter (Signed)
Agree.  Stay off Simvastatin. Continue all other medications. F/u as planned. Richardson Dopp, PA-C   03/20/2014 1:25 PM

## 2014-03-20 NOTE — Telephone Encounter (Signed)
I cb pt about her c/o leg pain. Pt states to me that she stopped all of her meds on Sunday except lisinopril and insulin, said she was not sure which one was causing the pain. I said most likely the simvastatin and to take her medications today. Pt said she was fine until he added this on (simvastatin), which looks like Scott had originally ordered pravastatin but insurance or pharmacy did not cover this med and so was changed over to simvastatin. I said I will d/w Richardson Dopp, PA about our conversation today and that we will see her Thursday at her appt with [facility]. Pt said thank you and will see [name] then.

## 2014-03-20 NOTE — Telephone Encounter (Signed)
New message     Patient calling C/O stop new medication on Sunday due legs, unable to walk, cramping. Patient stated something is wrong.

## 2014-03-22 ENCOUNTER — Ambulatory Visit (INDEPENDENT_AMBULATORY_CARE_PROVIDER_SITE_OTHER): Payer: Medicaid Other | Admitting: Physician Assistant

## 2014-03-22 ENCOUNTER — Encounter: Payer: Self-pay | Admitting: Physician Assistant

## 2014-03-22 VITALS — BP 140/87 | HR 67 | Ht 66.0 in | Wt 138.0 lb

## 2014-03-22 DIAGNOSIS — F17200 Nicotine dependence, unspecified, uncomplicated: Secondary | ICD-10-CM

## 2014-03-22 DIAGNOSIS — I428 Other cardiomyopathies: Secondary | ICD-10-CM

## 2014-03-22 DIAGNOSIS — M791 Myalgia, unspecified site: Secondary | ICD-10-CM

## 2014-03-22 DIAGNOSIS — R42 Dizziness and giddiness: Secondary | ICD-10-CM

## 2014-03-22 DIAGNOSIS — IMO0001 Reserved for inherently not codable concepts without codable children: Secondary | ICD-10-CM

## 2014-03-22 DIAGNOSIS — I809 Phlebitis and thrombophlebitis of unspecified site: Secondary | ICD-10-CM

## 2014-03-22 DIAGNOSIS — I5022 Chronic systolic (congestive) heart failure: Secondary | ICD-10-CM

## 2014-03-22 DIAGNOSIS — I10 Essential (primary) hypertension: Secondary | ICD-10-CM

## 2014-03-22 DIAGNOSIS — I2510 Atherosclerotic heart disease of native coronary artery without angina pectoris: Secondary | ICD-10-CM

## 2014-03-22 LAB — BASIC METABOLIC PANEL
BUN: 16 mg/dL (ref 6–23)
CALCIUM: 9.5 mg/dL (ref 8.4–10.5)
CO2: 32 meq/L (ref 19–32)
Chloride: 97 mEq/L (ref 96–112)
Creatinine, Ser: 0.6 mg/dL (ref 0.4–1.2)
GFR: 132.89 mL/min (ref >60.00)
GLUCOSE: 148 mg/dL — AB (ref 70–99)
Potassium: 3.8 mEq/L (ref 3.5–5.1)
SODIUM: 134 meq/L — AB (ref 135–145)

## 2014-03-22 LAB — CARDIAC PANEL
CK-MB: 2.1 ng/mL (ref 0.3–4.0)
RELATIVE INDEX: 3.3 calc — AB (ref 0.0–2.5)
Total CK: 64 U/L (ref 7–177)

## 2014-03-22 MED ORDER — LISINOPRIL 30 MG PO TABS: 30.0000 mg | ORAL_TABLET | Freq: Every day | ORAL | Status: DC

## 2014-03-22 MED ORDER — AMLODIPINE BESYLATE 5 MG PO TABS: 5.0000 mg | ORAL_TABLET | Freq: Every day | ORAL | Status: DC

## 2014-03-22 MED ORDER — SPIRONOLACTONE 25 MG PO TABS: 25.0000 mg | ORAL_TABLET | Freq: Every day | ORAL | Status: DC

## 2014-03-22 MED ORDER — FUROSEMIDE 40 MG PO TABS: 60.0000 mg | ORAL_TABLET | Freq: Every day | ORAL | Status: DC

## 2014-03-22 NOTE — Progress Notes (Signed)
[place], [place],   [address] Phone: [phone] Fax:  [phone]  Date:  03/22/2014   ID:  Alicia [name], DOB May 19, 1956, MRN [id number]  PCP:  Lorayne Marek, MD   Cardiologist:  Dr. Candee Furbish      History of Present Illness: Alicia [name] is a 58 y.o. female with a history of cardiomyopathy, systolic CHF, diabetes, HTN and prior stroke. She was admitted in 12/2013 with small bowel obstruction secondary to closed-loop small bowel hernia with strangulated bowel. She underwent exploratory laparotomy with adhesiolysis and small bowel resection with primary anastomosis. She was then admitted 3/29-3/31 with chest pain and a/c systolic CHF. Chest CTA was negative for pulmonary embolism but did show bilateral pleural effusions. Cardiac enzymes remained normal. She was diuresed for volume excess. She was seen by cardiology. Patient had a prior evaluation in [place]. Cardiac catheterization there in 2006 (after presenting with CP and minimal troponin elevation) demonstrated distal OM2 stenosis of 80%. Nuclear stress test in 06/2009 demonstrated no ischemia. Echocardiogram in 04/2013 demonstrated an EF of 30-40%. She was noted to have some anterior ST changes on ECG. Given her symptoms and risk factors, it was felt she needed further evaluation with cardiac catheterization in light of reduced ejection fraction. LHC demonstrated single-vessel disease with 30-40% mid and 60% distal RCA stenosis. Her cardiomyopathy was felt to be nonischemic.    I saw her for/13/15. I adjusted her medications are brought her back today for continued followup.  She was placed on simvastatin. She did not tolerate this. She tells me she had significant leg cramps. She felt bad. She also tells me that she almost passed out. She denies syncope. She stopped the medication and feels better now. She otherwise denies chest pain, shortness of breath, orthopnea, PND or edema.  Studies:  - LHC (02/19/14):  Mid  RCA 30-40, distal RCA 60, EF 25%.  - Echo (05/11/13): Moderate LVH, EF 30-40%.  - Nuclear (07/23/09 - in [place]):  No ischemia.   Recent Labs: 02/18/2014: Pro B Natriuretic peptide (BNP) 2876.0*; TSH 1.032  02/20/2014: Hemoglobin 10.9*  02/27/2014: ALT 17; Creatinine 0.44*; Potassium 4.4   Wt Readings from Last 3 Encounters:  03/22/14 138 lb (62.596 kg)  03/05/14 138 lb (62.596 kg)  02/27/14 136 lb 9.6 oz (61.961 kg)     Past Medical History  Diagnosis Date  . Diabetes mellitus, type II   . Hypertension   . H/O: CVA (cerebrovascular accident)     6 strokes, most recent on 05/10/13  . CHF (congestive heart failure)   . Stroke     Current Outpatient Prescriptions  Medication Sig Dispense Refill  . amLODipine (NORVASC) 5 MG tablet Take 1 tablet (5 mg total) by mouth daily.  30 tablet  0  . aspirin 81 MG chewable tablet Chew 1 tablet (81 mg total) by mouth daily.  30 tablet  0  . carvedilol (COREG) 3.125 MG tablet Take 1 tablet (3.125 mg total) by mouth 2 (two) times daily.  180 tablet  3  . furosemide (LASIX) 40 MG tablet Take 1 tablet (40 mg total) by mouth 2 (two) times daily.  60 tablet  0  . insulin NPH-regular Human (NOVOLIN 70/30) (70-30) 100 UNIT/ML injection Inject 5 Units into the skin daily with breakfast.      . lisinopril (PRINIVIL,ZESTRIL) 30 MG tablet Take 1 tablet (30 mg total) by mouth daily.  30 tablet  0  . polyethylene glycol (MIRALAX / GLYCOLAX)  packet Take 17 g by mouth daily as needed for mild constipation.  14 each  0  . potassium chloride 20 MEQ TBCR Take 10 mEq by mouth 2 (two) times daily.  60 tablet  0   No current facility-administered medications for this visit.    Allergies:   Asa and Other   Social History:  The patient  reports that she has been smoking Cigarettes.  She has a 12.5 pack-year smoking history. She has never used smokeless tobacco. She reports that she does not drink alcohol or use illicit drugs.   Family History:  The patient's  family history includes CVA in her mother; Diabetes Mellitus II in her mother; Hypertension in her mother.   ROS:  Please see the history of present illness.  She has a hardened area on her left forearm.  All other systems reviewed and negative.   PHYSICAL EXAM: VS:  BP 140/87  Pulse 67  Ht 5\' 6"  (1.676 m)  Wt 138 lb (62.596 kg)  BMI 22.28 kg/m2 Well nourished, well developed, in no acute distress HEENT: normal Neck: no JVD Cardiac:  normal S1, S2; RRR; no murmur Lungs:  clear to auscultation bilaterally, no wheezing, rhonchi or rales Abd: soft, nontender, no hepatomegaly Ext: no edemaleft forearm with hardened area consistent with superficial thrombophlebitis Skin: warm and dry Neuro:  CNs 2-12 intact, no focal abnormalities noted  EKG:   NSR, HR 67, septal Q waves, T wave inversions in 1 aVL, V5-V6   ASSESSMENT AND PLAN:  1. Chronic Systolic CHF: Volume remains stable.  2. Nonischemic Cardiomyopathy:  Continue beta blocker, ACEI. I will decrease her Lasix to 60 mg daily. I will start her on spironolactone 25 mg daily. Stop potassium. Check basic metabolic panel today, in 4 days and again in 11 days.  She has follow up echocardiogram in 3 months. If her EF remains less than 30%, refer to EP for possible ICD. 3. CAD: No angina. Continue aspirin.  We will try again for statin therapy at next appointment. I may need to contact her pharmacy to see what drugs they do carry. 4. Dizziness: I suspect that this is all a side effect from simvastatin. However, she does have a history of an EF 20%. I will obtain a 24-hour Holter. If she has a high PVC burden or NSVT , consider life vest. 5. Myalgia:  Check CK level. 6. Hypertension: Blood pressure is above target. Adjust medications as noted for nonischemic cardiomyopathy. 7. Diabetes: Continue follow up with primary care. 8. Prior stroke: Continue aspirin. 9. Tobacco Abuse: She is in the process of quitting. 10. Superficial thrombophlebitis:  I have recommended warm compresses. 11. Disposition: Follow up with me in  2-3 weeks.   Signed, Richardson Dopp, PA-C  03/22/2014 12:11 PM

## 2014-03-22 NOTE — Patient Instructions (Signed)
REFILLS HAVE BEEN SENT IN FOR AMLODIPINE; LISINOPRIL  DECREASE LASIX TO 60 MG DAILY  STOP THE POTASSIUM  START SPIRONOLACTONE 25 MG DAILY  LAB WORK TODAY; BMET, TOTAL CK  YOU WILL NEED REPEAT BMET 03/26/14 AND AGAIN ON 04/02/14  Your physician has recommended that you wear a 24 HOUR holter monitor. Holter monitors are medical devices that record the heart's electrical activity. Doctors most often use these monitors to diagnose arrhythmias. Arrhythmias are problems with the speed or rhythm of the heartbeat. The monitor is a small, portable device. You can wear one while you do your normal daily activities. This is usually used to diagnose what is causing palpitations/syncope (passing out).  Your physician recommends that you schedule a follow-up appointment in: 2-3 [place], PAC SAME DAY DR. Marlou Porch IS IN THE OFFICE

## 2014-03-26 ENCOUNTER — Other Ambulatory Visit: Payer: Medicaid Other

## 2014-03-27 ENCOUNTER — Encounter: Payer: Self-pay | Admitting: *Deleted

## 2014-03-27 ENCOUNTER — Encounter (INDEPENDENT_AMBULATORY_CARE_PROVIDER_SITE_OTHER): Payer: Medicaid Other

## 2014-03-27 ENCOUNTER — Other Ambulatory Visit (INDEPENDENT_AMBULATORY_CARE_PROVIDER_SITE_OTHER): Payer: Medicaid Other

## 2014-03-27 DIAGNOSIS — M791 Myalgia, unspecified site: Secondary | ICD-10-CM

## 2014-03-27 DIAGNOSIS — I5022 Chronic systolic (congestive) heart failure: Secondary | ICD-10-CM

## 2014-03-27 DIAGNOSIS — I10 Essential (primary) hypertension: Secondary | ICD-10-CM

## 2014-03-27 DIAGNOSIS — IMO0001 Reserved for inherently not codable concepts without codable children: Secondary | ICD-10-CM

## 2014-03-27 DIAGNOSIS — E1165 Type 2 diabetes mellitus with hyperglycemia: Secondary | ICD-10-CM

## 2014-03-27 DIAGNOSIS — R42 Dizziness and giddiness: Secondary | ICD-10-CM

## 2014-03-27 DIAGNOSIS — I428 Other cardiomyopathies: Secondary | ICD-10-CM

## 2014-03-27 DIAGNOSIS — I2510 Atherosclerotic heart disease of native coronary artery without angina pectoris: Secondary | ICD-10-CM

## 2014-03-27 LAB — HEPATIC FUNCTION PANEL
ALT: 13 U/L (ref 0–35)
AST: 17 U/L (ref 0–37)
Albumin: 3.8 g/dL (ref 3.5–5.2)
Alkaline Phosphatase: 57 U/L (ref 39–117)
BILIRUBIN DIRECT: 0.1 mg/dL (ref 0.0–0.3)
Total Bilirubin: 0.3 mg/dL (ref 0.2–1.2)
Total Protein: 7 g/dL (ref 6.0–8.3)

## 2014-03-27 LAB — BASIC METABOLIC PANEL
BUN: 15 mg/dL (ref 6–23)
CALCIUM: 9.6 mg/dL (ref 8.4–10.5)
CHLORIDE: 96 meq/L (ref 96–112)
CO2: 31 meq/L (ref 19–32)
Creatinine, Ser: 0.5 mg/dL (ref 0.4–1.2)
GFR: 167.87 mL/min (ref >60.00)
GLUCOSE: 148 mg/dL — AB (ref 70–99)
Potassium: 3.6 mEq/L (ref 3.5–5.1)
Sodium: 135 mEq/L (ref 135–145)

## 2014-03-27 LAB — LIPID PANEL
CHOL/HDL RATIO: 4
Cholesterol: 194 mg/dL (ref 0–200)
HDL: 49.5 mg/dL (ref >39.00)
LDL CALC: 135 mg/dL — AB (ref 0–99)
Triglycerides: 48 mg/dL (ref 0.0–149.0)
VLDL: 9.6 mg/dL (ref 0.0–40.0)

## 2014-03-27 NOTE — Progress Notes (Signed)
Patient ID: Alicia [name], female   DOB: 1955-12-14, 58 y.o.   MRN: [id number] E-Cardio 24 hour holter monitor applied to patient.

## 2014-03-28 ENCOUNTER — Telehealth: Payer: Self-pay | Admitting: *Deleted

## 2014-03-28 NOTE — Telephone Encounter (Signed)
lmptcb for lab results 

## 2014-03-29 NOTE — Telephone Encounter (Signed)
pt notified about lab results and will have repeat bmet 5/11, f/u w/PA 5/21 9:50. Pt verbalized Plan of Care

## 2014-04-02 ENCOUNTER — Other Ambulatory Visit (INDEPENDENT_AMBULATORY_CARE_PROVIDER_SITE_OTHER): Payer: Medicaid Other

## 2014-04-02 DIAGNOSIS — I10 Essential (primary) hypertension: Secondary | ICD-10-CM

## 2014-04-02 DIAGNOSIS — I428 Other cardiomyopathies: Secondary | ICD-10-CM

## 2014-04-02 DIAGNOSIS — M791 Myalgia, unspecified site: Secondary | ICD-10-CM

## 2014-04-02 DIAGNOSIS — IMO0001 Reserved for inherently not codable concepts without codable children: Secondary | ICD-10-CM

## 2014-04-02 DIAGNOSIS — I2510 Atherosclerotic heart disease of native coronary artery without angina pectoris: Secondary | ICD-10-CM

## 2014-04-02 DIAGNOSIS — I5022 Chronic systolic (congestive) heart failure: Secondary | ICD-10-CM

## 2014-04-02 DIAGNOSIS — R42 Dizziness and giddiness: Secondary | ICD-10-CM

## 2014-04-02 LAB — BASIC METABOLIC PANEL
BUN: 21 mg/dL (ref 6–23)
CALCIUM: 9.9 mg/dL (ref 8.4–10.5)
CHLORIDE: 96 meq/L (ref 96–112)
CO2: 31 mEq/L (ref 19–32)
CREATININE: 0.5 mg/dL (ref 0.4–1.2)
GFR: 156.73 mL/min (ref >60.00)
GLUCOSE: 149 mg/dL — AB (ref 70–99)
Potassium: 3.8 mEq/L (ref 3.5–5.1)
Sodium: 134 mEq/L — ABNORMAL LOW (ref 135–145)

## 2014-04-03 ENCOUNTER — Telehealth: Payer: Self-pay | Admitting: *Deleted

## 2014-04-03 NOTE — Telephone Encounter (Signed)
pt notified about lab results with verbal understanding  

## 2014-04-03 NOTE — Telephone Encounter (Signed)
lmptcb for lab results 

## 2014-04-03 NOTE — Telephone Encounter (Signed)
New message ° ° ° ° °Returning a nurses call to get lab results °

## 2014-04-09 ENCOUNTER — Telehealth: Payer: Self-pay | Admitting: Cardiology

## 2014-04-09 NOTE — Telephone Encounter (Signed)
Monitor results. 

## 2014-04-12 ENCOUNTER — Encounter: Payer: Self-pay | Admitting: Physician Assistant

## 2014-04-12 ENCOUNTER — Ambulatory Visit (INDEPENDENT_AMBULATORY_CARE_PROVIDER_SITE_OTHER): Payer: Medicaid Other | Admitting: Physician Assistant

## 2014-04-12 ENCOUNTER — Telehealth: Payer: Self-pay | Admitting: Physician Assistant

## 2014-04-12 VITALS — BP 140/90 | HR 66 | Ht 66.0 in | Wt 140.0 lb

## 2014-04-12 DIAGNOSIS — I5022 Chronic systolic (congestive) heart failure: Secondary | ICD-10-CM

## 2014-04-12 DIAGNOSIS — E1165 Type 2 diabetes mellitus with hyperglycemia: Secondary | ICD-10-CM

## 2014-04-12 DIAGNOSIS — I10 Essential (primary) hypertension: Secondary | ICD-10-CM

## 2014-04-12 DIAGNOSIS — F17200 Nicotine dependence, unspecified, uncomplicated: Secondary | ICD-10-CM

## 2014-04-12 DIAGNOSIS — I428 Other cardiomyopathies: Secondary | ICD-10-CM

## 2014-04-12 DIAGNOSIS — M791 Myalgia, unspecified site: Secondary | ICD-10-CM

## 2014-04-12 DIAGNOSIS — IMO0001 Reserved for inherently not codable concepts without codable children: Secondary | ICD-10-CM

## 2014-04-12 DIAGNOSIS — I5023 Acute on chronic systolic (congestive) heart failure: Secondary | ICD-10-CM

## 2014-04-12 DIAGNOSIS — E785 Hyperlipidemia, unspecified: Secondary | ICD-10-CM

## 2014-04-12 DIAGNOSIS — I2510 Atherosclerotic heart disease of native coronary artery without angina pectoris: Secondary | ICD-10-CM

## 2014-04-12 DIAGNOSIS — R42 Dizziness and giddiness: Secondary | ICD-10-CM

## 2014-04-12 MED ORDER — ATORVASTATIN CALCIUM 20 MG PO TABS: 20.0000 mg | ORAL_TABLET | Freq: Every day | ORAL | Status: DC

## 2014-04-12 MED ORDER — LISINOPRIL 20 MG PO TABS: 20.0000 mg | ORAL_TABLET | Freq: Two times a day (BID) | ORAL | Status: DC

## 2014-04-12 MED ORDER — CARVEDILOL 6.25 MG PO TABS: 6.2500 mg | ORAL_TABLET | Freq: Two times a day (BID) | ORAL | Status: DC

## 2014-04-12 NOTE — Telephone Encounter (Signed)
Spoke with Richardson Dopp PA. Increase Lisinopril 20 mg twice daily. Pt will take 2 10 mg tablets until she runs out   Your physician recommends that you return for lab work in: 2 weeks BMET

## 2014-04-12 NOTE — Addendum Note (Signed)
Addended by: Burnett Kanaris on: 04/12/2014 04:45 PM   Modules accepted: Orders

## 2014-04-12 NOTE — Progress Notes (Signed)
Cardiology Office Note   Date:  04/12/2014   ID:  Alicia [name], DOB [birth year], MRN [id number]  PCP:  Alicia Marek, MD   Cardiologist:  Dr. Candee Furbish      History of Present Illness: Alicia [name] is a 58 y.o. female with a history of cardiomyopathy, systolic CHF, diabetes, HTN and prior stroke. She was admitted in 12/2013 with SBO and underwent exploratory laparotomy with adhesiolysis and small bowel resection. She was then admitted 01/2014 with chest pain and a/c systolic CHF. Patient had a prior evaluation in [place]. Cardiac catheterization there in 2006 (after presenting with CP and minimal troponin elevation) demonstrated distal OM2 stenosis of 80%. Nuclear stress test in 06/2009 demonstrated no ischemia. Echo 04/2013 demonstrated an EF of 30-40%. It was felt she needed further evaluation with cardiac catheterization in light of reduced EF. LHC demonstrated 1v non-obstructive disease and her cardiomyopathy was felt to be nonischemic.    I saw her last on 03/22/14.  I added Spironolactone.  Holter monitor was obtained due to hx of dizziness.  She returns for follow up.  She is doing well. She denies chest pain, significant dyspnea, orthopnea, PND or edema. She denies syncope or near-syncope.   Studies:  - LHC (02/19/14):  Mid RCA 30-40, distal RCA 60, EF 25%.  - Echo (05/11/13): Moderate LVH, EF 30-40%.  - Nuclear (07/23/09 - in [place]):  No ischemia.  - Holter (03/2012): NSR, occasional PVCs, no significant arrhythmia   Recent Labs: 02/18/2014: Pro B Natriuretic peptide (BNP) 2876.0*; TSH 1.032  02/20/2014: Hemoglobin 10.9*  03/27/2014: ALT 13; HDL Cholesterol by NMR 49.50; LDL (calc) 135*  04/02/2014: Creatinine 0.5; Potassium 3.8   Wt Readings from Last 3 Encounters:  03/22/14 138 lb (62.596 kg)  03/05/14 138 lb (62.596 kg)  02/27/14 136 lb 9.6 oz (61.961 kg)     Past Medical History  Diagnosis Date  . Diabetes mellitus, type II   . Hypertension   . H/O: CVA  (cerebrovascular accident)     6 strokes, most recent on 05/10/13  . CHF (congestive heart failure)   . Stroke     Current Outpatient Prescriptions  Medication Sig Dispense Refill  . amLODipine (NORVASC) 5 MG tablet Take 1 tablet (5 mg total) by mouth daily.  30 tablet  11  . aspirin 81 MG chewable tablet Chew 1 tablet (81 mg total) by mouth daily.  30 tablet  0  . carvedilol (COREG) 3.125 MG tablet Take 1 tablet (3.125 mg total) by mouth 2 (two) times daily.  180 tablet  3  . furosemide (LASIX) 40 MG tablet Take 1.5 tablets (60 mg total) by mouth daily.      . insulin NPH-regular Human (NOVOLIN 70/30) (70-30) 100 UNIT/ML injection Inject 5 Units into the skin daily with breakfast.      . lisinopril (PRINIVIL,ZESTRIL) 30 MG tablet Take 1 tablet (30 mg total) by mouth daily.  30 tablet  11  . polyethylene glycol (MIRALAX / GLYCOLAX) packet Take 17 g by mouth daily as needed for mild constipation.  14 each  0  . spironolactone (ALDACTONE) 25 MG tablet Take 1 tablet (25 mg total) by mouth daily.  30 tablet  11   No current facility-administered medications for this visit.    Allergies:   Asa and Other   Social History:  The patient  reports that she has been smoking Cigarettes.  She has a 12.5 pack-year smoking history. She has never used smokeless tobacco. She reports  that she does not drink alcohol or use illicit drugs.   Family History:  The patient's family history includes CVA in her mother; Diabetes Mellitus II in her mother; Hypertension in her mother.   ROS:  Please see the history of present illness.  He has a chronic cough. All other systems reviewed and negative.    PHYSICAL EXAM: VS:  BP 140/90  Pulse 66  Ht 5\' 6"  (1.676 m)  Wt 140 lb (63.504 kg)  BMI 22.61 kg/m2 Well nourished, well developed, in no acute distress HEENT: normal Neck: no JVD Cardiac:  normal S1, S2; RRR; no murmur Lungs:  clear to auscultation bilaterally, no wheezing, rhonchi or rales Abd: soft,  nontender, no hepatomegaly Ext: no edema Skin: warm and dry Neuro:  CNs 2-12 intact, no focal abnormalities noted  EKG:   NSR, HR 66, LAD, T wave inversions in 1, 2, aVL, V4-V6, no change from prior tracing   ASSESSMENT AND PLAN:  1. Chronic Systolic CHF:  Volume remains stable.  2. Nonischemic Cardiomyopathy:  Continue beta blocker, ACEI, Spironolactone.  Increase carvedilol to 6.25 mg twice daily. She is not certain as to exactly how she is taking lisinopril. She will call us to notify us. If she has 10 mg tablets, I will increase this to 20 mg twice a day.  Echo pending in 05/2014.  If her EF remains less than 30%, refer to EP for possible ICD. 3. CAD:  No angina. Continue aspirin.  Will try her on Atorvastatin (carried in pharmacy at [facility]). 4. Hypertension:  Fair control.  Adjust medications as noted.  5. Hyperlipidemia:  Start Atorvastatin 20 mg QD.  Check Lipids and LFTs in 8 weeks.  6. Diabetes:  Continue follow up with primary care. 7. Prior stroke:  Continue aspirin. 8. Tobacco Abuse:  I have recommended cessation. 9. Disposition: Follow up with Dr. Candee Furbish next month as planned.    Signed, Versie Starks, MHS 04/12/2014 9:10 AM    [facility] [place], [place],   [address] Phone: [phone]; Fax: [phone]

## 2014-04-12 NOTE — Telephone Encounter (Signed)
New message     Pt just saw Scott.  She is taking 10mg  of lisinopril daily.  Patient could not remember what she was taking. Please let Nicki Reaper know

## 2014-04-12 NOTE — Patient Instructions (Addendum)
Your physician has recommended you make the following change in your medication:   1.Increase Carvedilol 6.25 mg twice daily 2. Start Atorvastatin 20 mg 1 tab daily  Your physician recommends that you return for lab work in: 2 months:LIPID, LFT's   Keep follow up appt with Dr. Marlou Porch  Please call us to verify dose of Lisinopril: [phone] asK for [facility]	[place]

## 2014-04-17 ENCOUNTER — Other Ambulatory Visit: Payer: Medicaid Other

## 2014-05-10 ENCOUNTER — Encounter: Payer: Self-pay | Admitting: Cardiology

## 2014-05-10 ENCOUNTER — Ambulatory Visit (INDEPENDENT_AMBULATORY_CARE_PROVIDER_SITE_OTHER): Payer: Medicaid Other | Admitting: Cardiology

## 2014-05-10 VITALS — BP 178/99 | HR 71 | Ht 66.0 in | Wt 139.4 lb

## 2014-05-10 DIAGNOSIS — I2510 Atherosclerotic heart disease of native coronary artery without angina pectoris: Secondary | ICD-10-CM

## 2014-05-10 DIAGNOSIS — I10 Essential (primary) hypertension: Secondary | ICD-10-CM

## 2014-05-10 DIAGNOSIS — I5022 Chronic systolic (congestive) heart failure: Secondary | ICD-10-CM

## 2014-05-10 DIAGNOSIS — I5042 Chronic combined systolic (congestive) and diastolic (congestive) heart failure: Secondary | ICD-10-CM | POA: Insufficient documentation

## 2014-05-10 DIAGNOSIS — I5032 Chronic diastolic (congestive) heart failure: Secondary | ICD-10-CM | POA: Insufficient documentation

## 2014-05-10 HISTORY — DX: Chronic systolic (congestive) heart failure: I50.22

## 2014-05-10 NOTE — Progress Notes (Signed)
[facility]. [address] [place], [place]  [address] Phone: [phone] Fax:  [phone]  Date:  05/10/2014   ID:  Alicia [name], DOB Jul 13, 1956, MRN [id number]  PCP:  Lorayne Marek, MD   History of Present Illness: Alicia [name] is a 58 y.o. female with a history of chronic systolic heart failure, diabetes, hypertension, prior stroke here for followup. Previous visit with Richardson Dopp adjusted medication. Last addition was Aldactone and increase of lisinopril. Her blood pressure today is increased however she states that she's been out of her medications for a few days. She's going to get those picked up. She's not having any symptoms of heart failure however. She has mowed the grass. No syncope, no retention of fluid. No chest pain.  Studies:  - LHC (02/19/14): Mid RCA 30-40, distal RCA 60, EF 25%.  - Echo (05/11/13): Moderate LVH, EF 30-40%.  - Nuclear (07/23/09 - in [place]): No ischemia.  - Holter (03/2012): NSR, occasional PVCs, no significant arrhythmia  Recent Labs:  02/18/2014: Pro B Natriuretic peptide (BNP) 2876.0*; TSH 1.032  02/20/2014: Hemoglobin 10.9*  03/27/2014: ALT 13; HDL Cholesterol by NMR 49.50; LDL (calc) 135*  04/02/2014: Creatinine 0.5; Potassium 3.8      Wt Readings from Last 3 Encounters:  05/10/14 139 lb 6.4 oz (63.231 kg)  04/12/14 140 lb (63.504 kg)  03/22/14 138 lb (62.596 kg)     Past Medical History  Diagnosis Date  . Diabetes mellitus, type II   . Hypertension   . H/O: CVA (cerebrovascular accident)     6 strokes, most recent on 05/10/13  . CHF (congestive heart failure)   . Stroke     Past Surgical History  Procedure Laterality Date  . Tubal ligation    . Cardiac catheterization    . Laparotomy N/A 01/15/2014    Procedure: EXPLORATORY LAPAROTOMY SMALL BOWEL RESECTION;  Surgeon: Imogene Burn. Georgette Dover, MD;  Location: [facility];  Service: General;  Laterality: N/A;  . Lysis of adhesion N/A 01/15/2014    Procedure: LYSIS OF ADHESION;   Surgeon: Imogene Burn. Georgette Dover, MD;  Location: [facility];  Service: General;  Laterality: N/A;    Current Outpatient Prescriptions  Medication Sig Dispense Refill  . amLODipine (NORVASC) 5 MG tablet Take 1 tablet (5 mg total) by mouth daily.  30 tablet  11  . aspirin 81 MG chewable tablet Chew 1 tablet (81 mg total) by mouth daily.  30 tablet  0  . atorvastatin (LIPITOR) 20 MG tablet Take 1 tablet (20 mg total) by mouth daily.  90 tablet  3  . carvedilol (COREG) 6.25 MG tablet Take 1 tablet (6.25 mg total) by mouth 2 (two) times daily.  180 tablet  3  . furosemide (LASIX) 40 MG tablet Take 1.5 tablets (60 mg total) by mouth daily.      . insulin NPH-regular Human (NOVOLIN 70/30) (70-30) 100 UNIT/ML injection Inject 5 Units into the skin daily with breakfast.      . lisinopril (PRINIVIL,ZESTRIL) 20 MG tablet Take 1 tablet (20 mg total) by mouth 2 (two) times daily.  60 tablet  4  . polyethylene glycol (MIRALAX / GLYCOLAX) packet Take 17 g by mouth daily as needed for mild constipation.  14 each  0   No current facility-administered medications for this visit.    Allergies:    Allergies  Allergen Reactions  . Asa [Aspirin] Other (See Comments)    Told not to take med-per MD  .  Other Other (See Comments)    Told not to take any dairy products-per MD    Social History:  The patient  reports that she has been smoking Cigarettes.  She has a 12.5 pack-year smoking history. She has never used smokeless tobacco. She reports that she does not drink alcohol or use illicit drugs.   Family History  Problem Relation Age of Onset  . Diabetes Mellitus II Mother   . Hypertension Mother   . CVA Mother     ROS:  Please see the history of present illness.   No syncope, no orthopnea, no chest pain, no PND   All other systems reviewed and negative.   PHYSICAL EXAM: VS:  BP 178/99  Pulse 71  Ht 5\' 6"  (1.676 m)  Wt 139 lb 6.4 oz (63.231 kg)  BMI 22.51 kg/m2 Well nourished, well developed, in no acute  distress HEENT: normal, [place]/AT, EOMI Neck: no JVD, normal carotid upstroke, no bruit Cardiac:  normal S1, S2; RRR; no murmur Lungs:  clear to auscultation bilaterally, no wheezing, rhonchi or rales Abd: soft, nontender, no hepatomegaly, no bruits Ext: no edema, 2+ distal pulses Skin: warm and dry GU: deferred Neuro: no focal abnormalities noted, AAO x 3  EKG:  None today     ASSESSMENT AND PLAN:  1. Chronic systolic heart failure-nonischemic. We will continue with current medications today. Her blood pressure is elevated however I do not know if this is a result of her not being with her medications at home. She is going to the pharmacy to pick these up. Continue to monitor. Echocardiogram scheduled in mid July. If ejection fraction remains less than 35%, we will refer to electrophysiology for consideration of ICD. 2. Hypertension-elevated today. Continue to encourage medication compliance. If necessary in the future, we will up titrate carvedilol. 3. Coronary artery disease-nonobstructive disease. Continue with secondary prevention. Statin. 4. 4 month followup with Richardson Dopp, PA-C  Signed, Candee Furbish, MD [facility]  05/10/2014 11:25 AM

## 2014-05-10 NOTE — Patient Instructions (Signed)
Your physician recommends that you schedule a follow-up appointment in: 4 months with Richardson Dopp PA  Your physician recommends that you continue on your current medications as directed. Please refer to the Current Medication list given to you today.

## 2014-05-28 ENCOUNTER — Telehealth: Payer: Self-pay | Admitting: *Deleted

## 2014-05-28 ENCOUNTER — Encounter: Payer: Self-pay | Admitting: Physician Assistant

## 2014-05-28 ENCOUNTER — Ambulatory Visit (HOSPITAL_COMMUNITY): Payer: Medicaid Other | Attending: Cardiology | Admitting: Radiology

## 2014-05-28 DIAGNOSIS — I5022 Chronic systolic (congestive) heart failure: Secondary | ICD-10-CM | POA: Insufficient documentation

## 2014-05-28 DIAGNOSIS — I059 Rheumatic mitral valve disease, unspecified: Secondary | ICD-10-CM | POA: Diagnosis not present

## 2014-05-28 DIAGNOSIS — IMO0001 Reserved for inherently not codable concepts without codable children: Secondary | ICD-10-CM

## 2014-05-28 DIAGNOSIS — Z8673 Personal history of transient ischemic attack (TIA), and cerebral infarction without residual deficits: Secondary | ICD-10-CM | POA: Diagnosis not present

## 2014-05-28 DIAGNOSIS — E785 Hyperlipidemia, unspecified: Secondary | ICD-10-CM | POA: Insufficient documentation

## 2014-05-28 DIAGNOSIS — E119 Type 2 diabetes mellitus without complications: Secondary | ICD-10-CM | POA: Diagnosis not present

## 2014-05-28 DIAGNOSIS — I10 Essential (primary) hypertension: Secondary | ICD-10-CM | POA: Insufficient documentation

## 2014-05-28 DIAGNOSIS — I428 Other cardiomyopathies: Secondary | ICD-10-CM

## 2014-05-28 DIAGNOSIS — E1165 Type 2 diabetes mellitus with hyperglycemia: Secondary | ICD-10-CM

## 2014-05-28 DIAGNOSIS — I2510 Atherosclerotic heart disease of native coronary artery without angina pectoris: Secondary | ICD-10-CM

## 2014-05-28 NOTE — Telephone Encounter (Signed)
lmptcb to go over echo results and referral to EP

## 2014-05-28 NOTE — Progress Notes (Signed)
Echocardiogram performed.  

## 2014-05-28 NOTE — Telephone Encounter (Signed)
lmptcb x 2 for echo results and referral to EP.

## 2014-05-29 ENCOUNTER — Ambulatory Visit: Payer: Medicaid Other | Admitting: Internal Medicine

## 2014-05-31 ENCOUNTER — Encounter: Payer: Self-pay | Admitting: *Deleted

## 2014-05-31 NOTE — Telephone Encounter (Signed)
lmptcb x 3 and letter sent out for ptcb. I also lmom on pt's daughter's phone tonight asking to have pt please cb so that we may go over test results. EF 20-25%, needs referral to EP.

## 2014-05-31 NOTE — Telephone Encounter (Signed)
lmptcb for echo results and referral to EP EF 20-25%. I will send out letter for cb today as well

## 2014-06-01 NOTE — Telephone Encounter (Signed)
pt notified about echo results with EF 20-25%. Pt advised per Brynda Rim. PA will be referred to EP. Pt aware scheduling will call her with date and time for appt with EP 1st available.

## 2014-06-01 NOTE — Telephone Encounter (Signed)
Follow up  ° ° ° °Returning call back to nurse  °

## 2014-06-11 ENCOUNTER — Ambulatory Visit (INDEPENDENT_AMBULATORY_CARE_PROVIDER_SITE_OTHER): Payer: Medicaid Other | Admitting: Internal Medicine

## 2014-06-11 ENCOUNTER — Encounter: Payer: Self-pay | Admitting: Internal Medicine

## 2014-06-11 VITALS — BP 182/92 | HR 70 | Ht 66.0 in | Wt 143.1 lb

## 2014-06-11 DIAGNOSIS — F17200 Nicotine dependence, unspecified, uncomplicated: Secondary | ICD-10-CM

## 2014-06-11 DIAGNOSIS — I509 Heart failure, unspecified: Secondary | ICD-10-CM

## 2014-06-11 DIAGNOSIS — I428 Other cardiomyopathies: Secondary | ICD-10-CM

## 2014-06-11 DIAGNOSIS — I5022 Chronic systolic (congestive) heart failure: Secondary | ICD-10-CM

## 2014-06-11 DIAGNOSIS — I110 Hypertensive heart disease with heart failure: Secondary | ICD-10-CM

## 2014-06-11 DIAGNOSIS — I2510 Atherosclerotic heart disease of native coronary artery without angina pectoris: Secondary | ICD-10-CM

## 2014-06-11 NOTE — Progress Notes (Signed)
Primary Care Physician: Lorayne Marek, MD Referring Physician:  Dr Hoy Register M Alicia [name] is a 58 y.o. female with a h/o nonischemic CM, NYHA Class II/III CHF, and poorly controlled hypertension who presents today for EP consultation/ risk stratification of sudden death.  She states that she has been told that she has a "weak heart" for about 2 years.  She has recently been evaluated by Dr Marlou Porch and initiated on medical therapy.  She has been nonadherent to medical therapy and continues to smoke.  She has symptoms of fatigue and decreased exercise tolerance.   She has occasional SOB.  She continues to mow her own lawn.  She has had 6 prior strokes and is on disability for this.  Today, she denies symptoms of palpitations, chest pain,  orthopnea, PND, lower extremity edema, dizziness, presyncope, syncope, or neurologic sequela. The patient is tolerating medications without difficulties and is otherwise without complaint today.   Past Medical History  Diagnosis Date  . Diabetes mellitus, type II   . Hypertension   . H/O: CVA (cerebrovascular accident)     6 strokes, most recent on 05/10/13  . Chronic systolic dysfunction of left ventricle   . NICM (nonischemic cardiomyopathy)     Echo (05/2014):  Mod LVH, EF 20-25%, inf HK, mild MR, mild LAE.  . Tobacco abuse   . Noncompliance    Past Surgical History  Procedure Laterality Date  . Tubal ligation    . Cardiac catheterization    . Laparotomy N/A 01/15/2014    Procedure: EXPLORATORY LAPAROTOMY SMALL BOWEL RESECTION;  Surgeon: Imogene Burn. Georgette Dover, MD;  Location: [facility];  Service: General;  Laterality: N/A;  . Lysis of adhesion N/A 01/15/2014    Procedure: LYSIS OF ADHESION;  Surgeon: Imogene Burn. Georgette Dover, MD;  Location: [facility];  Service: General;  Laterality: N/A;    Current Outpatient Prescriptions  Medication Sig Dispense Refill  . amLODipine (NORVASC) 5 MG tablet Take 1 tablet (5 mg total) by mouth daily.  30 tablet  11  . aspirin 81 MG  chewable tablet Chew 1 tablet (81 mg total) by mouth daily.  30 tablet  0  . atorvastatin (LIPITOR) 20 MG tablet Take 1 tablet (20 mg total) by mouth daily.  90 tablet  3  . carvedilol (COREG) 6.25 MG tablet Take 1 tablet (6.25 mg total) by mouth 2 (two) times daily.  180 tablet  3  . furosemide (LASIX) 40 MG tablet Take 1.5 tablets (60 mg total) by mouth daily.      . insulin NPH-regular Human (NOVOLIN 70/30) (70-30) 100 UNIT/ML injection Inject 5 Units into the skin daily with breakfast.      . lisinopril (PRINIVIL,ZESTRIL) 20 MG tablet Take 1 tablet (20 mg total) by mouth 2 (two) times daily.  60 tablet  4  . polyethylene glycol (MIRALAX / GLYCOLAX) packet Take 17 g by mouth daily as needed for mild constipation.  14 each  0   No current facility-administered medications for this visit.    Allergies  Allergen Reactions  . Asa [Aspirin] Other (See Comments)    Told not to take med-per MD  . Other Other (See Comments)    Told not to take any dairy products-per MD    History   Social History  . Marital Status: Widowed    Spouse Name: N/A    Number of Children: N/A  . Years of Education: N/A   Occupational History  . Not on file.   Social  History Main Topics  . Smoking status: Current Every Day Smoker -- 0.50 packs/day for 25 years    Types: Cigarettes  . Smokeless tobacco: Never Used  . Alcohol Use: No  . Drug Use: No  . Sexual Activity: Yes   Other Topics Concern  . Not on file   Social History Narrative   Works in [employer] fund[employer], currently disabled    Family History  Problem Relation Age of Onset  . Diabetes Mellitus II Mother   . Hypertension Mother   . CVA Mother     ROS- All systems are reviewed and negative except as per the HPI above  Physical Exam: Filed Vitals:   06/11/14 0848  BP: 182/92  Pulse: 70  Height: 5\' 6"  (1.676 m)  Weight: 143 lb 1.9 oz (64.919 kg)    GEN- The patient is well appearing, alert and oriented x 3 today.   Head- normocephalic,  atraumatic Eyes-  Sclera clear, conjunctiva pink, R eye with lateral deviation Ears- hearing intact Oropharynx- clear Neck- supple, no JVP Lymph- no cervical lymphadenopathy Lungs- Clear to ausculation bilaterally, normal work of breathing Heart- Regular rate and rhythm, no murmurs, rubs or gallops, PMI not laterally displaced GI- soft, NT, ND, + BS Extremities- no clubbing, cyanosis, or edema MS- no significant deformity or atrophy Skin- no rash or lesion Psych- euthymic mood, full affect Neuro- strength and sensation are intact  EKG today reveals sinus rhythm 70 bpm, PR 118, QRS 99, Qtc 440, diffuse t wave changes Echo 7/15 is reviewed Epic records including Dr Marlou Porch and Leta Speller notes are reviewed  Assessment and Plan:  1. Nonischemic CM The patient has a nonischemic CM.  She appears to be noncompliant with medicines (Says she only takes medicines at night but has both lisinopril and coreg twice daily prescribed).  The importance of medicine adherance/ optimization was discussed with her today.  She is clear that she does not wish for an ICD.  Given noncompiance, she is presently not a candidate.  Given her overall preferences, I would anticipate that she would want to avoid ICD implant long term. She will therefore continue medical therapy as directed by Dr Marlou Porch  2. Hypertensive cardiovascular disease BP is markedly elevated Compliance with medicine is encouraged She may also benefit from addition of spironolactone/ secondary causes of htn workup if not improved with compliance  3. Tobacco Cessation strongly advised  She will follow-up with Dr Marlou Porch I will see as needed going forward

## 2014-06-11 NOTE — Patient Instructions (Signed)
Your physician recommends that you schedule a follow-up appointment in: 4 weeks with Dr Marlou Porch   PLEASE start taking your medications as prescribed

## 2014-07-02 ENCOUNTER — Encounter: Payer: Self-pay | Admitting: Internal Medicine

## 2014-07-02 ENCOUNTER — Ambulatory Visit: Payer: Medicaid Other | Attending: Internal Medicine | Admitting: Internal Medicine

## 2014-07-02 VITALS — BP 160/80 | HR 99 | Temp 98.6°F | Resp 16 | Ht 66.0 in | Wt 145.0 lb

## 2014-07-02 DIAGNOSIS — F17200 Nicotine dependence, unspecified, uncomplicated: Secondary | ICD-10-CM

## 2014-07-02 DIAGNOSIS — E119 Type 2 diabetes mellitus without complications: Secondary | ICD-10-CM

## 2014-07-02 DIAGNOSIS — I10 Essential (primary) hypertension: Secondary | ICD-10-CM

## 2014-07-02 LAB — POCT GLYCOSYLATED HEMOGLOBIN (HGB A1C): Hemoglobin A1C: 7.6

## 2014-07-02 LAB — GLUCOSE, POCT (MANUAL RESULT ENTRY): POC GLUCOSE: 278 mg/dL — AB (ref 70–99)

## 2014-07-02 MED ORDER — GLIMEPIRIDE 2 MG PO TABS: 2.0000 mg | ORAL_TABLET | Freq: Every day | ORAL | Status: DC

## 2014-07-02 MED ORDER — CLONIDINE HCL 0.1 MG PO TABS
0.1000 mg | ORAL_TABLET | Freq: Once | ORAL | Status: AC
  Administered 2014-07-02: 0.1 mg via ORAL

## 2014-07-02 NOTE — Progress Notes (Signed)
Patient here for blood pressure and diabetes check.  She did not take her medication this morning.  BP elevated per Dr Annitta Needs 0.1mg  clonidine given.

## 2014-07-02 NOTE — Patient Instructions (Addendum)
Diabetes Mellitus and Food It is important for you to manage your blood sugar (glucose) level. Your blood glucose level can be greatly affected by what you eat. Eating healthier foods in the appropriate amounts throughout the day at about the same time each day will help you control your blood glucose level. It can also help slow or prevent worsening of your diabetes mellitus. Healthy eating may even help you improve the level of your blood pressure and reach or maintain a healthy weight.  HOW CAN FOOD AFFECT ME? Carbohydrates Carbohydrates affect your blood glucose level more than any other type of food. Your dietitian will help you determine how many carbohydrates to eat at each meal and teach you how to count carbohydrates. Counting carbohydrates is important to keep your blood glucose at a healthy level, especially if you are using insulin or taking certain medicines for diabetes mellitus. Alcohol Alcohol can cause sudden decreases in blood glucose (hypoglycemia), especially if you use insulin or take certain medicines for diabetes mellitus. Hypoglycemia can be a life-threatening condition. Symptoms of hypoglycemia (sleepiness, dizziness, and disorientation) are similar to symptoms of having too much alcohol.  If your health care provider has given you approval to drink alcohol, do so in moderation and use the following guidelines:  Women should not have more than one drink per day, and men should not have more than two drinks per day. One drink is equal to:  12 oz of beer.  5 oz of wine.  1 oz of hard liquor.  Do not drink on an empty stomach.  Keep yourself hydrated. Have water, diet soda, or unsweetened iced tea.  Regular soda, juice, and other mixers might contain a lot of carbohydrates and should be counted. WHAT FOODS ARE NOT RECOMMENDED? As you make food choices, it is important to remember that all foods are not the same. Some foods have fewer nutrients per serving than other  foods, even though they might have the same number of calories or carbohydrates. It is difficult to get your body what it needs when you eat foods with fewer nutrients. Examples of foods that you should avoid that are high in calories and carbohydrates but low in nutrients include:  Trans fats (most processed foods list trans fats on the Nutrition Facts label).  Regular soda.  Juice.  Candy.  Sweets, such as cake, pie, doughnuts, and cookies.  Fried foods. WHAT FOODS CAN I EAT? Have nutrient-rich foods, which will nourish your body and keep you healthy. The food you should eat also will depend on several factors, including:  The calories you need.  The medicines you take.  Your weight.  Your blood glucose level.  Your blood pressure level.  Your cholesterol level. You also should eat a variety of foods, including:  Protein, such as meat, poultry, fish, tofu, nuts, and seeds (lean animal proteins are best).  Fruits.  Vegetables.  Dairy products, such as milk, cheese, and yogurt (low fat is best).  Breads, grains, pasta, cereal, rice, and beans.  Fats such as olive oil, trans fat-free margarine, canola oil, avocado, and olives. DOES EVERYONE WITH DIABETES MELLITUS HAVE THE SAME MEAL PLAN? Because every person with diabetes mellitus is different, there is not one meal plan that works for everyone. It is very important that you meet with a dietitian who will help you create a meal plan that is just right for you. Document Released: 08/06/2005 Document Revised: 11/14/2013 Document Reviewed: 10/06/2013 ExitCare Patient Information 2015 [employer]. This   information is not intended to replace advice given to you by your health care provider. Make sure you discuss any questions you have with your health care provider. DASH Eating Plan DASH stands for "Dietary Approaches to Stop Hypertension." The DASH eating plan is a healthy eating plan that has been shown to reduce high  blood pressure (hypertension). Additional health benefits may include reducing the risk of type 2 diabetes mellitus, heart disease, and stroke. The DASH eating plan may also help with weight loss. WHAT DO I NEED TO KNOW ABOUT THE DASH EATING PLAN? For the DASH eating plan, you will follow these general guidelines:  Choose foods with a percent daily value for sodium of less than 5% (as listed on the food label).  Use salt-free seasonings or herbs instead of table salt or sea salt.  Check with your health care provider or pharmacist before using salt substitutes.  Eat lower-sodium products, often labeled as "lower sodium" or "no salt added."  Eat fresh foods.  Eat more vegetables, fruits, and low-fat dairy products.  Choose whole grains. Look for the word "whole" as the first word in the ingredient list.  Choose fish and skinless chicken or turkey more often than red meat. Limit fish, poultry, and meat to 6 oz (170 g) each day.  Limit sweets, desserts, sugars, and sugary drinks.  Choose heart-healthy fats.  Limit cheese to 1 oz (28 g) per day.  Eat more home-cooked food and less restaurant, buffet, and fast food.  Limit fried foods.  Cook foods using methods other than frying.  Limit canned vegetables. If you do use them, rinse them well to decrease the sodium.  When eating at a restaurant, ask that your food be prepared with less salt, or no salt if possible. WHAT FOODS CAN I EAT? Seek help from a dietitian for individual calorie needs. Grains Whole grain or whole wheat bread. Brown rice. Whole grain or whole wheat pasta. Quinoa, bulgur, and whole grain cereals. Low-sodium cereals. Corn or whole wheat flour tortillas. Whole grain cornbread. Whole grain crackers. Low-sodium crackers. Vegetables Fresh or frozen vegetables (raw, steamed, roasted, or grilled). Low-sodium or reduced-sodium tomato and vegetable juices. Low-sodium or reduced-sodium tomato sauce and paste. Low-sodium  or reduced-sodium canned vegetables.  Fruits All fresh, canned (in natural juice), or frozen fruits. Meat and Other Protein Products Ground beef (85% or leaner), grass-fed beef, or beef trimmed of fat. Skinless chicken or turkey. Ground chicken or turkey. Pork trimmed of fat. All fish and seafood. Eggs. Dried beans, peas, or lentils. Unsalted nuts and seeds. Unsalted canned beans. Dairy Low-fat dairy products, such as skim or 1% milk, 2% or reduced-fat cheeses, low-fat ricotta or cottage cheese, or plain low-fat yogurt. Low-sodium or reduced-sodium cheeses. Fats and Oils Tub margarines without trans fats. Light or reduced-fat mayonnaise and salad dressings (reduced sodium). Avocado. Safflower, olive, or canola oils. Natural peanut or almond butter. Other Unsalted popcorn and pretzels. The items listed above may not be a complete list of recommended foods or beverages. Contact your dietitian for more options. WHAT FOODS ARE NOT RECOMMENDED? Grains White bread. White pasta. White rice. Refined cornbread. Bagels and croissants. Crackers that contain trans fat. Vegetables Creamed or fried vegetables. Vegetables in a cheese sauce. Regular canned vegetables. Regular canned tomato sauce and paste. Regular tomato and vegetable juices. Fruits Dried fruits. Canned fruit in light or heavy syrup. Fruit juice. Meat and Other Protein Products Fatty cuts of meat. Ribs, chicken wings, bacon, sausage, bologna, salami, chitterlings, fatback, hot   dogs, bratwurst, and packaged luncheon meats. Salted nuts and seeds. Canned beans with salt. Dairy Whole or 2% milk, cream, half-and-half, and cream cheese. Whole-fat or sweetened yogurt. Full-fat cheeses or blue cheese. Nondairy creamers and whipped toppings. Processed cheese, cheese spreads, or cheese curds. Condiments Onion and garlic salt, seasoned salt, table salt, and sea salt. Canned and packaged gravies. Worcestershire sauce. Tartar sauce. Barbecue sauce.  Teriyaki sauce. Soy sauce, including reduced sodium. Steak sauce. Fish sauce. Oyster sauce. Cocktail sauce. Horseradish. Ketchup and mustard. Meat flavorings and tenderizers. Bouillon cubes. Hot sauce. Tabasco sauce. Marinades. Taco seasonings. Relishes. Fats and Oils Butter, stick margarine, lard, shortening, ghee, and bacon fat. Coconut, palm kernel, or palm oils. Regular salad dressings. Other Pickles and olives. Salted popcorn and pretzels. The items listed above may not be a complete list of foods and beverages to avoid. Contact your dietitian for more information. WHERE CAN I FIND MORE INFORMATION? National Heart, Lung, and Blood Institute: www.nhlbi.nih.gov/health/health-topics/topics/dash/ Document Released: 10/29/2011 Document Revised: 03/26/2014 Document Reviewed: 09/13/2013 ExitCare Patient Information 2015 [employer]. This information is not intended to replace advice given to you by your health care provider. Make sure you discuss any questions you have with your health care provider.  

## 2014-07-02 NOTE — Progress Notes (Signed)
MRN: [id number] Name: Alicia [name]  Sex: female Age: 58 y.o. DOB: 1956/10/01  Allergies: Diona Fanti and Other  Chief Complaint  Patient presents with  . Diabetes  . Hypertension    HPI: Patient is 58 y.o. female who history of diabetes hypertension comes today for followup, today her blood pressure is elevated as per patient she has not taken her blood pressure medication today denies any headache dizziness chest and shortness of breath patient also history of chronic systolic heart failure following up with the cardiologist, for diabetes she has been taking insulin 5 units each bedtime, she denies any hypoglycemic symptoms today her A1c 7.6%. As per patient she used to take oral hypoglycemic medication in the past  Past Medical History  Diagnosis Date  . Diabetes mellitus, type II   . Hypertension   . H/O: CVA (cerebrovascular accident)     6 strokes, most recent on 05/10/13  . Chronic systolic dysfunction of left ventricle   . NICM (nonischemic cardiomyopathy)     Echo (05/2014):  Mod LVH, EF 20-25%, inf HK, mild MR, mild LAE.  . Tobacco abuse   . Noncompliance     Past Surgical History  Procedure Laterality Date  . Tubal ligation    . Cardiac catheterization    . Laparotomy N/A 01/15/2014    Procedure: EXPLORATORY LAPAROTOMY SMALL BOWEL RESECTION;  Surgeon: Imogene Burn. Georgette Dover, MD;  Location: [facility];  Service: General;  Laterality: N/A;  . Lysis of adhesion N/A 01/15/2014    Procedure: LYSIS OF ADHESION;  Surgeon: Imogene Burn. Georgette Dover, MD;  Location: [facility];  Service: General;  Laterality: N/A;      Medication List       This list is accurate as of: 07/02/14 12:31 PM.  Always use your most recent med list.               amLODipine 5 MG tablet  Commonly known as:  NORVASC  Take 1 tablet (5 mg total) by mouth daily.     aspirin 81 MG chewable tablet  Chew 1 tablet (81 mg total) by mouth daily.     atorvastatin 20 MG tablet  Commonly known as:  LIPITOR  Take 1 tablet (20 mg  total) by mouth daily.     carvedilol 6.25 MG tablet  Commonly known as:  COREG  Take 1 tablet (6.25 mg total) by mouth 2 (two) times daily.     furosemide 40 MG tablet  Commonly known as:  LASIX  Take 1.5 tablets (60 mg total) by mouth daily.     glimepiride 2 MG tablet  Commonly known as:  AMARYL  Take 1 tablet (2 mg total) by mouth daily before breakfast.     insulin NPH-regular Human (70-30) 100 UNIT/ML injection  Commonly known as:  NOVOLIN 70/30  Inject 5 Units into the skin daily with breakfast.     lisinopril 20 MG tablet  Commonly known as:  PRINIVIL,ZESTRIL  Take 1 tablet (20 mg total) by mouth 2 (two) times daily.     polyethylene glycol packet  Commonly known as:  MIRALAX / GLYCOLAX  Take 17 g by mouth daily as needed for mild constipation.        Meds ordered this encounter  Medications  . cloNIDine (CATAPRES) tablet 0.1 mg    Sig:   . glimepiride (AMARYL) 2 MG tablet    Sig: Take 1 tablet (2 mg total) by mouth daily before breakfast.    Dispense:  30 tablet    Refill:  3    Immunization History  Administered Date(s) Administered  . Influenza,inj,Quad PF,36+ Mos 10/16/2013    Family History  Problem Relation Age of Onset  . Diabetes Mellitus II Mother   . Hypertension Mother   . CVA Mother     History  Substance Use Topics  . Smoking status: Current Every Day Smoker -- 0.50 packs/day for 25 years    Types: Cigarettes  . Smokeless tobacco: Never Used  . Alcohol Use: No    Review of Systems   As noted in HPI  Filed Vitals:   07/02/14 1220  BP: 160/80  Pulse:   Temp:   Resp:     Physical Exam  Physical Exam  Constitutional: No distress.  Neck: Neck supple.  Cardiovascular: Normal rate and regular rhythm.   Pulmonary/Chest: Breath sounds normal. No respiratory distress. She has no wheezes. She has no rales.  Musculoskeletal: She exhibits no edema.    CBC    Component Value Date/Time   WBC 5.8 02/20/2014 0446   RBC 4.01  02/20/2014 0446   HGB 10.9* 02/20/2014 0446   HCT 32.6* 02/20/2014 0446   PLT 244 02/20/2014 0446   MCV 81.3 02/20/2014 0446   LYMPHSABS 1.2 01/14/2014 0917   MONOABS 0.9 01/14/2014 0917   EOSABS 0.0 01/14/2014 0917   BASOSABS 0.0 01/14/2014 0917    CMP     Component Value Date/Time   NA 134* 04/02/2014 0745   K 3.8 04/02/2014 0745   CL 96 04/02/2014 0745   CO2 31 04/02/2014 0745   GLUCOSE 149* 04/02/2014 0745   BUN 21 04/02/2014 0745   CREATININE 0.5 04/02/2014 0745   CREATININE 0.44* 02/27/2014 1149   CALCIUM 9.9 04/02/2014 0745   PROT 7.0 03/27/2014 0909   ALBUMIN 3.8 03/27/2014 0909   AST 17 03/27/2014 0909   ALT 13 03/27/2014 0909   ALKPHOS 57 03/27/2014 0909   BILITOT 0.3 03/27/2014 0909   GFRNONAA >89 02/27/2014 1149   GFRNONAA >90 02/20/2014 0446   GFRAA >89 02/27/2014 1149   GFRAA >90 02/20/2014 0446    Lab Results  Component Value Date/Time   CHOL 194 03/27/2014  9:09 AM    No components found with this basename: hga1c    Lab Results  Component Value Date/Time   AST 17 03/27/2014  9:09 AM    Assessment and Plan  Type II or unspecified type diabetes mellitus without mention of complication, not stated as uncontrolled - Plan:  Results for orders placed in visit on 07/02/14  GLUCOSE, POCT (MANUAL RESULT ENTRY)      Result Value Ref Range   POC Glucose 278 (*) 70 - 99 mg/dl  POCT GLYCOSYLATED HEMOGLOBIN (HGB A1C)      Result Value Ref Range   Hemoglobin A1C 7.6     HgB A1c, patient will continue with her insulin, history patient on  glimepiride (AMARYL) 2 MG tablet, also advise for diabetes meal planning.  Unspecified essential hypertension - Plan: cloNIDine (CATAPRES) tablet 0.1 mg her repeat manual blood pressure is 160/80,  advised patient for DASH diet her, she will resume back on her blood pressure medications, come back in 2 weeks for BP check.  Tobacco use disorder Patient is going to try nicotine patch.     Return in about 3 months (around 10/02/2014) for diabetes,  hypertension, BP check in 2 weeks/Nurse Visit.  Lorayne Marek, MD

## 2014-07-10 ENCOUNTER — Ambulatory Visit (INDEPENDENT_AMBULATORY_CARE_PROVIDER_SITE_OTHER): Payer: Medicaid Other | Admitting: Cardiology

## 2014-07-10 ENCOUNTER — Encounter: Payer: Self-pay | Admitting: Cardiology

## 2014-07-10 VITALS — BP 160/90 | HR 66 | Ht 66.0 in | Wt 147.0 lb

## 2014-07-10 DIAGNOSIS — IMO0001 Reserved for inherently not codable concepts without codable children: Secondary | ICD-10-CM

## 2014-07-10 DIAGNOSIS — E1165 Type 2 diabetes mellitus with hyperglycemia: Secondary | ICD-10-CM

## 2014-07-10 DIAGNOSIS — I5022 Chronic systolic (congestive) heart failure: Secondary | ICD-10-CM

## 2014-07-10 DIAGNOSIS — I428 Other cardiomyopathies: Secondary | ICD-10-CM

## 2014-07-10 MED ORDER — CARVEDILOL 12.5 MG PO TABS: 12.5000 mg | ORAL_TABLET | Freq: Two times a day (BID) | ORAL | Status: DC

## 2014-07-10 NOTE — Progress Notes (Signed)
[facility]. [address], [address] [place], [place]  [address] Phone: [phone] Fax:  [phone]  Date:  07/10/2014   ID:  Alicia [name], DOB [birth year], MRN [id number]  PCP:  Lorayne Marek, MD   History of Present Illness: Alicia [name] is a 58 y.o. female with a history of chronic systolic heart failure, diabetes, hypertension, prior stroke here for followup. Previous visit with Richardson Dopp adjusted medication. Last addition was Aldactone and increase of lisinopril. Her blood pressure today is increased however she states that she's been out of her medications for a few days. She's going to get those picked up. She's not having any symptoms of heart failure however. She has mowed the grass. No syncope, no retention of fluid. No chest pain.  Studies:  - LHC (02/19/14): Mid RCA 30-40, distal RCA 60, EF 25%.  - Echo (05/11/13): Moderate LVH, EF 30-40%.  - Nuclear (07/23/09 - in [facility]): No ischemia.  - Holter (03/2012): NSR, occasional PVCs, no significant arrhythmia  Recent Labs:  02/18/2014: Pro B Natriuretic peptide (BNP) 2876.0*; TSH 1.032  02/20/2014: Hemoglobin 10.9*  03/27/2014: ALT 13; HDL Cholesterol by NMR 49.50; LDL (calc) 135*  04/02/2014: Creatinine 0.5; Potassium 3.8      Wt Readings from Last 3 Encounters:  07/10/14 147 lb (66.679 kg)  07/02/14 145 lb (65.772 kg)  06/11/14 143 lb 1.9 oz (64.919 kg)     Past Medical History  Diagnosis Date  . Diabetes mellitus, type II   . Hypertension   . H/O: CVA (cerebrovascular accident)     6 strokes, most recent on 05/10/13  . Chronic systolic dysfunction of left ventricle   . NICM (nonischemic cardiomyopathy)     Echo (05/2014):  Mod LVH, EF 20-25%, inf HK, mild MR, mild LAE.  . Tobacco abuse   . Noncompliance     Past Surgical History  Procedure Laterality Date  . Tubal ligation    . Cardiac catheterization    . Laparotomy N/A 01/15/2014    Procedure: EXPLORATORY LAPAROTOMY SMALL BOWEL RESECTION;  Surgeon:  Imogene Burn. Georgette Dover, MD;  Location: [facility];  Service: General;  Laterality: N/A;  . Lysis of adhesion N/A 01/15/2014    Procedure: LYSIS OF ADHESION;  Surgeon: Imogene Burn. Georgette Dover, MD;  Location: [facility];  Service: General;  Laterality: N/A;    Current Outpatient Prescriptions  Medication Sig Dispense Refill  . amLODipine (NORVASC) 5 MG tablet Take 1 tablet (5 mg total) by mouth daily.  30 tablet  11  . aspirin 81 MG chewable tablet Chew 1 tablet (81 mg total) by mouth daily.  30 tablet  0  . atorvastatin (LIPITOR) 20 MG tablet Take 1 tablet (20 mg total) by mouth daily.  90 tablet  3  . carvedilol (COREG) 6.25 MG tablet Take 1 tablet (6.25 mg total) by mouth 2 (two) times daily.  180 tablet  3  . furosemide (LASIX) 40 MG tablet Take 1.5 tablets (60 mg total) by mouth daily.      Marland Kitchen glimepiride (AMARYL) 2 MG tablet Take 1 tablet (2 mg total) by mouth daily before breakfast.  30 tablet  3  . insulin NPH-regular Human (NOVOLIN 70/30) (70-30) 100 UNIT/ML injection Inject 5 Units into the skin daily with breakfast.      . lisinopril (PRINIVIL,ZESTRIL) 20 MG tablet Take 1 tablet (20 mg total) by mouth 2 (two) times daily.  60 tablet  4  . polyethylene glycol (MIRALAX / GLYCOLAX) packet  Take 17 g by mouth daily as needed for mild constipation.  14 each  0   No current facility-administered medications for this visit.    Allergies:    Allergies  Allergen Reactions  . Asa [Aspirin] Other (See Comments)    Told not to take med-per MD  . Other Other (See Comments)    Told not to take any dairy products-per MD    Social History:  The patient  reports that she has been smoking Cigarettes.  She has a 12.5 pack-year smoking history. She has never used smokeless tobacco. She reports that she does not drink alcohol or use illicit drugs.   Family History  Problem Relation Age of Onset  . Diabetes Mellitus II Mother   . Hypertension Mother   . CVA Mother     ROS:  Please see the history of present illness.    No syncope, no orthopnea, no chest pain, no PND   All other systems reviewed and negative.   PHYSICAL EXAM: VS:  BP 160/90  Pulse 66  Ht 5\' 6"  (1.676 m)  Wt 147 lb (66.679 kg)  BMI 23.74 kg/m2 Well nourished, well developed, in no acute distress HEENT: normal, [place]/AT, EOMI Neck: no JVD, normal carotid upstroke, no bruit Cardiac:  normal S1, S2; RRR; no murmur Lungs:  clear to auscultation bilaterally, no wheezing, rhonchi or rales Abd: soft, nontender, no hepatomegaly, no bruits Ext: no edema, 2+ distal pulses Skin: warm and dry GU: deferred Neuro: no focal abnormalities noted, AAO x 3  EKG:  None today     ASSESSMENT AND PLAN:  1. Chronic systolic heart failure-nonischemic. She now states that she is taking her medications. I will increase her carvedilol from 6.25 mg to 12.5 mg twice a day.  2. Hypertension-elevated today. Continue to encourage medication compliance, up titrate carvedilol. 3. Coronary artery disease-nonobstructive disease. Continue with secondary prevention. Statin. 4. 4 month followup  5. Appreciate Dr. Rayann Heman consultation. They discussed ICD, no ICD.  Signed, Candee Furbish, MD [facility]  07/10/2014 4:54 PM

## 2014-07-10 NOTE — Patient Instructions (Signed)
Your physician has recommended you make the following change in your medication:   1. Increase Coreg to 12.5 mg twice a day.  Your physician recommends that you schedule a follow-up appointment in: 4 months with Dr. Marlou Porch.

## 2014-07-13 ENCOUNTER — Ambulatory Visit: Payer: Medicaid Other | Attending: Internal Medicine | Admitting: *Deleted

## 2014-07-13 VITALS — BP 162/97 | HR 70 | Resp 16

## 2014-07-13 DIAGNOSIS — I10 Essential (primary) hypertension: Secondary | ICD-10-CM

## 2014-07-13 NOTE — Progress Notes (Signed)
Patient here for BP check. Patient denies headaches, blurred vision, and chest pain. Per patient her cardiologist increased carvedilol from 6.25 mg to 12.5 mg twice a day. Patient will return on 07/25/2014 for BP check. Vivia Birmingham, RN

## 2014-07-13 NOTE — Patient Instructions (Signed)

## 2014-09-10 ENCOUNTER — Encounter (INDEPENDENT_AMBULATORY_CARE_PROVIDER_SITE_OTHER): Payer: Medicaid Other | Admitting: Physician Assistant

## 2014-09-10 NOTE — Progress Notes (Deleted)
Cardiology Office Note   Date:  09/10/2014   ID:  Alicia [name], DOB 1956/01/15, MRN [id number]  PCP:  Lorayne Marek, MD   Cardiologist:  Dr. Candee Furbish      History of Present Illness: Alicia [name] is a 58 y.o. female with a hx of cardiomyopathy, systolic CHF, diabetes, HTN and prior stroke. She was admitted in 12/2013 with SBO and underwent exploratory laparotomy with adhesiolysis and small bowel resection. Admitted 01/2014 with chest pain and a/c systolic CHF. Patient had a prior evaluation in [place]. Cardiac catheterization there in 2006 (after presenting with CP and minimal troponin elevation) demonstrated distal OM2 stenosis of 80%. Nuclear stress test in 06/2009 demonstrated no ischemia. Echo 04/2013 demonstrated an EF of 30-40%. It was felt she needed further evaluation with cardiac catheterization in light of reduced EF. LHC demonstrated 1v non-obstructive disease and her cardiomyopathy was felt to be nonischemic.    Follow up echocardiogram in 7/15 demonstrated continued reduced ejection fraction at 20-25%. Patient was referred to EP for possible AICD implantation. She saw Dr. Rayann Heman in July. She was noted to be non-adherent with her medications. Therefore, she was not felt to be an AICD candidate. Patient expressed that she did not want an AICD implanted. Continued medical therapy was recommended.  Last seen by Dr. Marlou Porch 8/15. She returns for follow up.  ***   Studies:  - LHC (02/19/14):  Mid RCA 30-40, distal RCA 60, EF 25%.  - Echo (05/11/13): Moderate LVH, EF 30-40%.  - Echo (7/15): Moderate LVH, EF 20-25%, inferior HK, mild MR, mild LAE  - Nuclear (07/23/09 - in [place]):  No ischemia.  - Holter (03/2012): NSR, occasional PVCs, no significant arrhythmia   Recent Labs: 02/18/2014: Pro B Natriuretic peptide (BNP) 2876.0*; TSH 1.032  02/20/2014: Hemoglobin 10.9*  03/27/2014: ALT 13; HDL Cholesterol by NMR 49.50; LDL (calc) 135*  04/02/2014: Creatinine 0.5; Potassium  3.8   Wt Readings from Last 3 Encounters:  07/10/14 147 lb (66.679 kg)  07/02/14 145 lb (65.772 kg)  06/11/14 143 lb 1.9 oz (64.919 kg)     Past Medical History  Diagnosis Date  . Diabetes mellitus, type II   . Hypertension   . H/O: CVA (cerebrovascular accident)     6 strokes, most recent on 05/10/13  . Chronic systolic dysfunction of left ventricle   . NICM (nonischemic cardiomyopathy)     Echo (05/2014):  Mod LVH, EF 20-25%, inf HK, mild MR, mild LAE.  . Tobacco abuse   . Noncompliance     Current Outpatient Prescriptions  Medication Sig Dispense Refill  . amLODipine (NORVASC) 5 MG tablet Take 1 tablet (5 mg total) by mouth daily.  30 tablet  11  . aspirin 81 MG chewable tablet Chew 1 tablet (81 mg total) by mouth daily.  30 tablet  0  . atorvastatin (LIPITOR) 20 MG tablet Take 1 tablet (20 mg total) by mouth daily.  90 tablet  3  . carvedilol (COREG) 12.5 MG tablet Take 1 tablet (12.5 mg total) by mouth 2 (two) times daily.  180 tablet  3  . furosemide (LASIX) 40 MG tablet Take 1.5 tablets (60 mg total) by mouth daily.      Marland Kitchen glimepiride (AMARYL) 2 MG tablet Take 1 tablet (2 mg total) by mouth daily before breakfast.  30 tablet  3  . insulin NPH-regular Human (NOVOLIN 70/30) (70-30) 100 UNIT/ML injection Inject 5 Units into the skin daily with breakfast.      .  lisinopril (PRINIVIL,ZESTRIL) 20 MG tablet Take 1 tablet (20 mg total) by mouth 2 (two) times daily.  60 tablet  4  . polyethylene glycol (MIRALAX / GLYCOLAX) packet Take 17 g by mouth daily as needed for mild constipation.  14 each  0   No current facility-administered medications for this visit.    Allergies:   Asa and Other   Social History:  The patient  reports that she has been smoking Cigarettes.  She has a 12.5 pack-year smoking history. She has never used smokeless tobacco. She reports that she does not drink alcohol or use illicit drugs.   Family History:  The patient's family history includes CVA in her  mother; Diabetes Mellitus II in her mother; Hypertension in her mother.   ROS:  Please see the history of present illness. ***  All other systems reviewed and negative.    PHYSICAL EXAM: VS:  There were no vitals taken for this visit. Well nourished, well developed, in no acute distress HEENT: normal Neck: no JVD Cardiac:  normal S1, S2; RRR; no murmur Lungs:  clear to auscultation bilaterally, no wheezing, rhonchi or rales Abd: soft, nontender, no hepatomegaly Ext: no edema Skin: warm and dry Neuro:  CNs 2-12 intact, no focal abnormalities noted  EKG:   ***   ASSESSMENT AND PLAN:  1. Chronic Systolic CHF:  *** 2. Nonischemic Cardiomyopathy:  *** 3. CAD:  ***No angina. Continue aspirin, *** 4. Hypertension:  ***  5. Hyperlipidemia:  *** 6. Diabetes:  ***Continue follow up with primary care. 7. Prior stroke:  ***Continue aspirin. 8. Tobacco Abuse:  ***  Disposition:   FU ***.    Signed, Versie Starks, MHS 09/10/2014 8:36 AM    [facility] [place], [place], [place]  [address] Phone: [phone]; Fax: [phone]

## 2014-09-11 NOTE — Progress Notes (Signed)
This encounter was created in error - please disregard.

## 2014-09-26 ENCOUNTER — Encounter: Payer: Self-pay | Admitting: Physician Assistant

## 2014-09-26 ENCOUNTER — Ambulatory Visit (INDEPENDENT_AMBULATORY_CARE_PROVIDER_SITE_OTHER): Payer: Medicaid Other | Admitting: Physician Assistant

## 2014-09-26 VITALS — BP 170/100 | HR 69 | Ht 66.0 in | Wt 158.0 lb

## 2014-09-26 DIAGNOSIS — I2510 Atherosclerotic heart disease of native coronary artery without angina pectoris: Secondary | ICD-10-CM

## 2014-09-26 DIAGNOSIS — M791 Myalgia, unspecified site: Secondary | ICD-10-CM

## 2014-09-26 DIAGNOSIS — I429 Cardiomyopathy, unspecified: Secondary | ICD-10-CM

## 2014-09-26 DIAGNOSIS — I428 Other cardiomyopathies: Secondary | ICD-10-CM

## 2014-09-26 DIAGNOSIS — E785 Hyperlipidemia, unspecified: Secondary | ICD-10-CM

## 2014-09-26 DIAGNOSIS — I10 Essential (primary) hypertension: Secondary | ICD-10-CM

## 2014-09-26 DIAGNOSIS — R42 Dizziness and giddiness: Secondary | ICD-10-CM

## 2014-09-26 DIAGNOSIS — I5022 Chronic systolic (congestive) heart failure: Secondary | ICD-10-CM

## 2014-09-26 DIAGNOSIS — R0602 Shortness of breath: Secondary | ICD-10-CM

## 2014-09-26 DIAGNOSIS — E118 Type 2 diabetes mellitus with unspecified complications: Secondary | ICD-10-CM

## 2014-09-26 LAB — BASIC METABOLIC PANEL
BUN: 15 mg/dL (ref 6–23)
CO2: 25 mEq/L (ref 19–32)
Calcium: 9.5 mg/dL (ref 8.4–10.5)
Chloride: 98 mEq/L (ref 96–112)
Creatinine, Ser: 0.6 mg/dL (ref 0.4–1.2)
GFR: 140.73 mL/min (ref >60.00)
Glucose, Bld: 173 mg/dL — ABNORMAL HIGH (ref 70–99)
Potassium: 4.4 mEq/L (ref 3.5–5.1)
Sodium: 134 mEq/L — ABNORMAL LOW (ref 135–145)

## 2014-09-26 LAB — BRAIN NATRIURETIC PEPTIDE: Pro B Natriuretic peptide (BNP): 395 pg/mL — ABNORMAL HIGH (ref 0.0–100.0)

## 2014-09-26 MED ORDER — AMLODIPINE BESYLATE 10 MG PO TABS: 10.0000 mg | ORAL_TABLET | Freq: Every day | ORAL | Status: DC

## 2014-09-26 NOTE — Progress Notes (Signed)
Cardiology Office Note   Date:  09/26/2014   ID:  Alicia [name], DOB 1956-06-06, MRN [id number]  PCP:  Lorayne Marek, MD   Cardiologist:  Dr. Candee Furbish      History of Present Illness: Alicia [name] is a 58 y.o. female with a history of cardiomyopathy, systolic CHF, diabetes, HTN and prior stroke. She was admitted in 12/2013 with SBO and underwent exploratory laparotomy with adhesiolysis and small bowel resection. She was then admitted 01/2014 with chest pain and a/c systolic CHF. Patient had a prior evaluation in [place] in 2006.  LHC demonstrated 1v non-obstructive disease and her cardiomyopathy was felt to be nonischemic.    Patient saw Dr. Rayann Heman in 7/15 for consideration of ICD. She demonstrated evidence of non-adherence to medications. She also declined ICD implantation. Therefore, she was not felt to be a candidate. Medical therapy was continued. Last seen by Dr. Marlou Porch 8/15. Carvedilol was increased for blood pressure control.  She returns for FU.    She ran out of her lisinopril. She has not taken it since yesterday morning. She has not taken any of her other medications yet today. She's had URI symptoms for the last several weeks. These symptoms are resolving. She notes chronic dyspnea with exertion. She is NYHA 2b-3. She denies orthopnea, PND or edema. She denies chest discomfort. She denies syncope or near syncope.   Studies:  - LHC (02/19/14):  Mid RCA 30-40, distal RCA 60, EF 25%.  - Echo (7/15):  Mod LVH, EF 20-25%, inf HK, mild MR, mild LAE  - Nuclear (07/23/09 - in [place]):  No ischemia.  - Holter (03/2012): NSR, occasional PVCs, no significant arrhythmia    Recent Labs: 02/18/2014: Pro B Natriuretic peptide (BNP) 2876.0*; TSH 1.032 02/20/2014: Hemoglobin 10.9* 03/27/2014: ALT 13; HDL Cholesterol by NMR 49.50; LDL (calc) 135* 04/02/2014: Creatinine 0.5; Potassium 3.8   Wt Readings from Last 3 Encounters:  09/26/14 158 lb (71.668 kg)  07/10/14 147 lb (66.679 kg)    07/02/14 145 lb (65.772 kg)     Past Medical History  Diagnosis Date  . Diabetes mellitus, type II   . Hypertension   . H/O: CVA (cerebrovascular accident)     6 strokes, most recent on 05/10/13  . Chronic systolic dysfunction of left ventricle   . NICM (nonischemic cardiomyopathy)     Echo (05/2014):  Mod LVH, EF 20-25%, inf HK, mild MR, mild LAE.  . Tobacco abuse   . Noncompliance     Current Outpatient Prescriptions  Medication Sig Dispense Refill  . amLODipine (NORVASC) 5 MG tablet Take 1 tablet (5 mg total) by mouth daily. 30 tablet 11  . aspirin 81 MG chewable tablet Chew 1 tablet (81 mg total) by mouth daily. 30 tablet 0  . atorvastatin (LIPITOR) 20 MG tablet Take 1 tablet (20 mg total) by mouth daily. 90 tablet 3  . carvedilol (COREG) 12.5 MG tablet Take 1 tablet (12.5 mg total) by mouth 2 (two) times daily. 180 tablet 3  . furosemide (LASIX) 40 MG tablet Take 1.5 tablets (60 mg total) by mouth daily.    Marland Kitchen glimepiride (AMARYL) 2 MG tablet Take 1 tablet (2 mg total) by mouth daily before breakfast. 30 tablet 3  . insulin NPH-regular Human (NOVOLIN 70/30) (70-30) 100 UNIT/ML injection Inject 5 Units into the skin daily with breakfast.    . lisinopril (PRINIVIL,ZESTRIL) 20 MG tablet Take 1 tablet (20 mg total) by mouth 2 (two) times daily. 60 tablet 4  .  polyethylene glycol (MIRALAX / GLYCOLAX) packet Take 17 g by mouth daily as needed for mild constipation. 14 each 0  . spironolactone (ALDACTONE) 25 MG tablet Take 25 mg by mouth daily.  11   No current facility-administered medications for this visit.    Allergies:   Asa and Other   Social History:  The patient  reports that she has been smoking Cigarettes.  She has a 12.5 pack-year smoking history. She has never used smokeless tobacco. She reports that she does not drink alcohol or use illicit drugs.   Family History:  The patient's family history includes CVA in her mother; Diabetes Mellitus II in her mother; Hypertension  in her mother.   ROS:  Please see the history of present illness.  She denies fevers, purulent sputum, significant headache.  All other systems reviewed and negative.    PHYSICAL EXAM: VS:  BP 170/100 mmHg  Pulse 69  Ht 5\' 6"  (1.676 m)  Wt 158 lb (71.668 kg)  BMI 25.51 kg/m2 Well nourished, well developed, in no acute distress HEENT: normal Neck: no JVD Cardiac:  normal S1, S2; RRR; no murmur Lungs:  clear to auscultation bilaterally, no wheezing, rhonchi or rales Abd: soft, nontender, no hepatomegaly Ext: no edema Skin: warm and dry Neuro:  CNs 2-12 intact, no focal abnormalities noted  EKG:   NSR, HR 69, left axis deviation, inferior and anterolateral T-wave inversions, no change from prior tracing   ASSESSMENT AND PLAN:  1.  Chronic Systolic CHF:  Volume appears stable. Her cough is likely related to her URI.     -  Check a follow-up BMET.     -  Also check a BNP. If elevated, adjust Lasix.  2.  Nonischemic Cardiomyopathy:  Continue beta blocker, ACEI, Spironolactone.  She has declined ICD.  Blood pressure remains uncontrolled. Consider adding hydralazine. 3.  CAD:  No angina. Continue aspirin, Atorvastatin. 4.  Hypertension:  Uncontrolled. No medications yet today. Adherence continues to be a problem. We discussed this today.    -  Increase amlodipine to 10 mg daily.    -  Consider adding hydralazine.  5.  Hyperlipidemia:  Continue statin.  03/27/2014: ALT 13; HDL Cholesterol by NMR 49.50; LDL (calc) 135*  Arrange follow-up lipids and LFTs. 6.  Diabetes:  Continue follow up with primary care.  07/02/2014: Hemoglobin-A1c 7.6  7.  Prior stroke:  Continue aspirin.  8.  Tobacco Abuse:  We discussed the importance of cessation, again.  Disposition: FU with Dr. Marlou Porch next month as scheduled.  Signed, Versie Starks, MHS 09/26/2014 9:47 AM    [facility] [place], [place], [place]  [address] Phone: [phone]; Fax: [phone]

## 2014-09-26 NOTE — Patient Instructions (Addendum)
LAB WORK TODAY; BMET, BNP  INCREASE AMLODIPINE TO 10 MG DAILY; NEW RX SENT IN TODAY  OK TO TAKE CORICIDIN FOR COLDS OK TO USE SALINE NASAL SPRAY  KEEP YOUR FOLLOW UP WITH DR. Marlou Porch 11/09/14 YOU WILL NEED FASTING LIPID AND LIVER PANEL TO BE DONE WHEN YOU SEE DR. Marlou Porch 11/09/14

## 2014-09-28 ENCOUNTER — Telehealth: Payer: Self-pay | Admitting: *Deleted

## 2014-09-28 DIAGNOSIS — I5022 Chronic systolic (congestive) heart failure: Secondary | ICD-10-CM

## 2014-09-28 DIAGNOSIS — I10 Essential (primary) hypertension: Secondary | ICD-10-CM

## 2014-09-28 DIAGNOSIS — R0602 Shortness of breath: Secondary | ICD-10-CM

## 2014-09-28 MED ORDER — FUROSEMIDE 40 MG PO TABS
ORAL_TABLET | ORAL | Status: DC
Start: 2014-09-28 — End: 2015-08-05

## 2014-09-28 NOTE — Telephone Encounter (Signed)
pt notified about lab results and to increase lasix to 40 mg BID x 5 days then resume lasix 60 mg daily. BMET, BNP 11/20.Marland KitchenPt verbalized understanidng to Plan of care.

## 2014-10-03 ENCOUNTER — Ambulatory Visit: Payer: Medicaid Other | Admitting: Internal Medicine

## 2014-10-09 ENCOUNTER — Encounter: Payer: Self-pay | Admitting: Internal Medicine

## 2014-10-09 ENCOUNTER — Ambulatory Visit: Payer: Medicaid Other | Attending: Internal Medicine | Admitting: Internal Medicine

## 2014-10-09 ENCOUNTER — Ambulatory Visit (HOSPITAL_BASED_OUTPATIENT_CLINIC_OR_DEPARTMENT_OTHER): Payer: Medicaid Other

## 2014-10-09 VITALS — BP 150/80 | HR 76 | Temp 98.0°F | Resp 16 | Wt 161.2 lb

## 2014-10-09 DIAGNOSIS — F17210 Nicotine dependence, cigarettes, uncomplicated: Secondary | ICD-10-CM | POA: Diagnosis not present

## 2014-10-09 DIAGNOSIS — Z720 Tobacco use: Secondary | ICD-10-CM

## 2014-10-09 DIAGNOSIS — I10 Essential (primary) hypertension: Secondary | ICD-10-CM | POA: Insufficient documentation

## 2014-10-09 DIAGNOSIS — E1165 Type 2 diabetes mellitus with hyperglycemia: Secondary | ICD-10-CM | POA: Insufficient documentation

## 2014-10-09 DIAGNOSIS — I429 Cardiomyopathy, unspecified: Secondary | ICD-10-CM | POA: Insufficient documentation

## 2014-10-09 DIAGNOSIS — F17200 Nicotine dependence, unspecified, uncomplicated: Secondary | ICD-10-CM

## 2014-10-09 DIAGNOSIS — Z23 Encounter for immunization: Secondary | ICD-10-CM | POA: Diagnosis not present

## 2014-10-09 DIAGNOSIS — Z8673 Personal history of transient ischemic attack (TIA), and cerebral infarction without residual deficits: Secondary | ICD-10-CM | POA: Insufficient documentation

## 2014-10-09 DIAGNOSIS — I5189 Other ill-defined heart diseases: Secondary | ICD-10-CM | POA: Diagnosis not present

## 2014-10-09 DIAGNOSIS — Z794 Long term (current) use of insulin: Secondary | ICD-10-CM | POA: Diagnosis not present

## 2014-10-09 DIAGNOSIS — Z7982 Long term (current) use of aspirin: Secondary | ICD-10-CM | POA: Diagnosis not present

## 2014-10-09 DIAGNOSIS — E139 Other specified diabetes mellitus without complications: Secondary | ICD-10-CM

## 2014-10-09 LAB — POCT GLYCOSYLATED HEMOGLOBIN (HGB A1C): Hemoglobin A1C: 7.8

## 2014-10-09 LAB — GLUCOSE, POCT (MANUAL RESULT ENTRY): POC GLUCOSE: 138 mg/dL — AB (ref 70–99)

## 2014-10-09 MED ORDER — GLIMEPIRIDE 4 MG PO TABS: 4.0000 mg | ORAL_TABLET | Freq: Every day | ORAL | Status: DC

## 2014-10-09 NOTE — Progress Notes (Signed)
Patient here for follow up on her diabetes Complains her blood pressure has been running high

## 2014-10-09 NOTE — Patient Instructions (Signed)
Diabetes Mellitus and Food It is important for you to manage your blood sugar (glucose) level. Your blood glucose level can be greatly affected by what you eat. Eating healthier foods in the appropriate amounts throughout the day at about the same time each day will help you control your blood glucose level. It can also help slow or prevent worsening of your diabetes mellitus. Healthy eating may even help you improve the level of your blood pressure and reach or maintain a healthy weight.  HOW CAN FOOD AFFECT ME? Carbohydrates Carbohydrates affect your blood glucose level more than any other type of food. Your dietitian will help you determine how many carbohydrates to eat at each meal and teach you how to count carbohydrates. Counting carbohydrates is important to keep your blood glucose at a healthy level, especially if you are using insulin or taking certain medicines for diabetes mellitus. Alcohol Alcohol can cause sudden decreases in blood glucose (hypoglycemia), especially if you use insulin or take certain medicines for diabetes mellitus. Hypoglycemia can be a life-threatening condition. Symptoms of hypoglycemia (sleepiness, dizziness, and disorientation) are similar to symptoms of having too much alcohol.  If your health care provider has given you approval to drink alcohol, do so in moderation and use the following guidelines:  Women should not have more than one drink per day, and men should not have more than two drinks per day. One drink is equal to:  12 oz of beer.  5 oz of wine.  1 oz of hard liquor.  Do not drink on an empty stomach.  Keep yourself hydrated. Have water, diet soda, or unsweetened iced tea.  Regular soda, juice, and other mixers might contain a lot of carbohydrates and should be counted. WHAT FOODS ARE NOT RECOMMENDED? As you make food choices, it is important to remember that all foods are not the same. Some foods have fewer nutrients per serving than other  foods, even though they might have the same number of calories or carbohydrates. It is difficult to get your body what it needs when you eat foods with fewer nutrients. Examples of foods that you should avoid that are high in calories and carbohydrates but low in nutrients include:  Trans fats (most processed foods list trans fats on the Nutrition Facts label).  Regular soda.  Juice.  Candy.  Sweets, such as cake, pie, doughnuts, and cookies.  Fried foods. WHAT FOODS CAN I EAT? Have nutrient-rich foods, which will nourish your body and keep you healthy. The food you should eat also will depend on several factors, including:  The calories you need.  The medicines you take.  Your weight.  Your blood glucose level.  Your blood pressure level.  Your cholesterol level. You also should eat a variety of foods, including:  Protein, such as meat, poultry, fish, tofu, nuts, and seeds (lean animal proteins are best).  Fruits.  Vegetables.  Dairy products, such as milk, cheese, and yogurt (low fat is best).  Breads, grains, pasta, cereal, rice, and beans.  Fats such as olive oil, trans fat-free margarine, canola oil, avocado, and olives. DOES EVERYONE WITH DIABETES MELLITUS HAVE THE SAME MEAL PLAN? Because every person with diabetes mellitus is different, there is not one meal plan that works for everyone. It is very important that you meet with a dietitian who will help you create a meal plan that is just right for you. Document Released: 08/06/2005 Document Revised: 11/14/2013 Document Reviewed: 10/06/2013 ExitCare Patient Information 2015 [employer]. This   information is not intended to replace advice given to you by your health care provider. Make sure you discuss any questions you have with your health care provider. DASH Eating Plan DASH stands for "Dietary Approaches to Stop Hypertension." The DASH eating plan is a healthy eating plan that has been shown to reduce high  blood pressure (hypertension). Additional health benefits may include reducing the risk of type 2 diabetes mellitus, heart disease, and stroke. The DASH eating plan may also help with weight loss. WHAT DO I NEED TO KNOW ABOUT THE DASH EATING PLAN? For the DASH eating plan, you will follow these general guidelines:  Choose foods with a percent daily value for sodium of less than 5% (as listed on the food label).  Use salt-free seasonings or herbs instead of table salt or sea salt.  Check with your health care provider or pharmacist before using salt substitutes.  Eat lower-sodium products, often labeled as "lower sodium" or "no salt added."  Eat fresh foods.  Eat more vegetables, fruits, and low-fat dairy products.  Choose whole grains. Look for the word "whole" as the first word in the ingredient list.  Choose fish and skinless chicken or turkey more often than red meat. Limit fish, poultry, and meat to 6 oz (170 g) each day.  Limit sweets, desserts, sugars, and sugary drinks.  Choose heart-healthy fats.  Limit cheese to 1 oz (28 g) per day.  Eat more home-cooked food and less restaurant, buffet, and fast food.  Limit fried foods.  Cook foods using methods other than frying.  Limit canned vegetables. If you do use them, rinse them well to decrease the sodium.  When eating at a restaurant, ask that your food be prepared with less salt, or no salt if possible. WHAT FOODS CAN I EAT? Seek help from a dietitian for individual calorie needs. Grains Whole grain or whole wheat bread. Brown rice. Whole grain or whole wheat pasta. Quinoa, bulgur, and whole grain cereals. Low-sodium cereals. Corn or whole wheat flour tortillas. Whole grain cornbread. Whole grain crackers. Low-sodium crackers. Vegetables Fresh or frozen vegetables (raw, steamed, roasted, or grilled). Low-sodium or reduced-sodium tomato and vegetable juices. Low-sodium or reduced-sodium tomato sauce and paste. Low-sodium  or reduced-sodium canned vegetables.  Fruits All fresh, canned (in natural juice), or frozen fruits. Meat and Other Protein Products Ground beef (85% or leaner), grass-fed beef, or beef trimmed of fat. Skinless chicken or turkey. Ground chicken or turkey. Pork trimmed of fat. All fish and seafood. Eggs. Dried beans, peas, or lentils. Unsalted nuts and seeds. Unsalted canned beans. Dairy Low-fat dairy products, such as skim or 1% milk, 2% or reduced-fat cheeses, low-fat ricotta or cottage cheese, or plain low-fat yogurt. Low-sodium or reduced-sodium cheeses. Fats and Oils Tub margarines without trans fats. Light or reduced-fat mayonnaise and salad dressings (reduced sodium). Avocado. Safflower, olive, or canola oils. Natural peanut or almond butter. Other Unsalted popcorn and pretzels. The items listed above may not be a complete list of recommended foods or beverages. Contact your dietitian for more options. WHAT FOODS ARE NOT RECOMMENDED? Grains White bread. White pasta. White rice. Refined cornbread. Bagels and croissants. Crackers that contain trans fat. Vegetables Creamed or fried vegetables. Vegetables in a cheese sauce. Regular canned vegetables. Regular canned tomato sauce and paste. Regular tomato and vegetable juices. Fruits Dried fruits. Canned fruit in light or heavy syrup. Fruit juice. Meat and Other Protein Products Fatty cuts of meat. Ribs, chicken wings, bacon, sausage, bologna, salami, chitterlings, fatback, hot   dogs, bratwurst, and packaged luncheon meats. Salted nuts and seeds. Canned beans with salt. Dairy Whole or 2% milk, cream, half-and-half, and cream cheese. Whole-fat or sweetened yogurt. Full-fat cheeses or blue cheese. Nondairy creamers and whipped toppings. Processed cheese, cheese spreads, or cheese curds. Condiments Onion and garlic salt, seasoned salt, table salt, and sea salt. Canned and packaged gravies. Worcestershire sauce. Tartar sauce. Barbecue sauce.  Teriyaki sauce. Soy sauce, including reduced sodium. Steak sauce. Fish sauce. Oyster sauce. Cocktail sauce. Horseradish. Ketchup and mustard. Meat flavorings and tenderizers. Bouillon cubes. Hot sauce. Tabasco sauce. Marinades. Taco seasonings. Relishes. Fats and Oils Butter, stick margarine, lard, shortening, ghee, and bacon fat. Coconut, palm kernel, or palm oils. Regular salad dressings. Other Pickles and olives. Salted popcorn and pretzels. The items listed above may not be a complete list of foods and beverages to avoid. Contact your dietitian for more information. WHERE CAN I FIND MORE INFORMATION? National Heart, Lung, and [employer] Institute: www.nhlbi.nih.gov/health/health-topics/topics/dash/ Document Released: 10/29/2011 Document Revised: 03/26/2014 Document Reviewed: 09/13/2013 ExitCare Patient Information 2015 [employer]. This information is not intended to replace advice given to you by your health care provider. Make sure you discuss any questions you have with your health care provider.  

## 2014-10-09 NOTE — Progress Notes (Signed)
MRN: [id number] Name: Alicia [name]  Sex: female Age: 58 y.o. DOB: September 02, 1956  Allergies: Diona Fanti and Other  Chief Complaint  Patient presents with  . Follow-up    HPI: Patient is 58 y.o. female whohas history of hypertension hyperlipidemia, CHF, diabetes comes today for followup as per patient she has already seen her cardiologist and her blood pressure medications were adjusted today her manual blood pressure is 150/80, she denies any headache dizziness chest and shortness of breath, she still smokes cigarettes, I have advised patient to quit smoking, for diabetes patient has been taking insulin 5 units each bedtime and is taking Amaryl 2 mg daily, today her hemoglobin A1c is7.8%, which has trended up compared to last visit as per patient she is compliant in taking her medication.   Past Medical History  Diagnosis Date  . Diabetes mellitus, type II   . Hypertension   . H/O: CVA (cerebrovascular accident)     6 strokes, most recent on 05/10/13  . Chronic systolic dysfunction of left ventricle   . NICM (nonischemic cardiomyopathy)     Echo (05/2014):  Mod LVH, EF 20-25%, inf HK, mild MR, mild LAE.  . Tobacco abuse   . Noncompliance     Past Surgical History  Procedure Laterality Date  . Tubal ligation    . Cardiac catheterization    . Laparotomy N/A 01/15/2014    Procedure: EXPLORATORY LAPAROTOMY SMALL BOWEL RESECTION;  Surgeon: Imogene Burn. Georgette Dover, MD;  Location: [place];  Service: General;  Laterality: N/A;  . Lysis of adhesion N/A 01/15/2014    Procedure: LYSIS OF ADHESION;  Surgeon: Imogene Burn. Georgette Dover, MD;  Location: [place];  Service: General;  Laterality: N/A;      Medication List       This list is accurate as of: 10/09/14 10:49 AM.  Always use your most recent med list.               amLODipine 10 MG tablet  Commonly known as:  NORVASC  Take 1 tablet (10 mg total) by mouth daily.     aspirin 81 MG chewable tablet  Chew 1 tablet (81 mg total) by mouth daily.     atorvastatin 20 MG tablet  Commonly known as:  LIPITOR  Take 1 tablet (20 mg total) by mouth daily.     carvedilol 12.5 MG tablet  Commonly known as:  COREG  Take 1 tablet (12.5 mg total) by mouth 2 (two) times daily.     furosemide 40 MG tablet  Commonly known as:  LASIX  Take 40 mg twice daily x 5 days then go back to 1.5 mg daily     glimepiride 4 MG tablet  Commonly known as:  AMARYL  Take 1 tablet (4 mg total) by mouth daily before breakfast.     insulin NPH-regular Human (70-30) 100 UNIT/ML injection  Commonly known as:  NOVOLIN 70/30  Inject 8 Units into the skin at bedtime.     lisinopril 20 MG tablet  Commonly known as:  PRINIVIL,ZESTRIL  Take 1 tablet (20 mg total) by mouth 2 (two) times daily.     polyethylene glycol packet  Commonly known as:  MIRALAX / GLYCOLAX  Take 17 g by mouth daily as needed for mild constipation.     spironolactone 25 MG tablet  Commonly known as:  ALDACTONE  Take 25 mg by mouth daily.        Meds ordered this encounter  Medications  .  glimepiride (AMARYL) 4 MG tablet    Sig: Take 1 tablet (4 mg total) by mouth daily before breakfast.    Dispense:  30 tablet    Refill:  3    Immunization History  Administered Date(s) Administered  . Influenza,inj,Quad PF,36+ Mos 10/16/2013    Family History  Problem Relation Age of Onset  . Diabetes Mellitus II Mother   . Hypertension Mother   . CVA Mother     History  Substance Use Topics  . Smoking status: Current Every Day Smoker -- 0.50 packs/day for 25 years    Types: Cigarettes  . Smokeless tobacco: Never Used  . Alcohol Use: No    Review of Systems   As noted in HPI  Filed Vitals:   10/09/14 1044  BP: 150/80  Pulse:   Temp:   Resp:     Physical Exam  Physical Exam  Constitutional: No distress.  Eyes: EOM are normal. Pupils are equal, round, and reactive to light.  Cardiovascular: Normal rate and regular rhythm.   Pulmonary/Chest: Breath sounds normal. No  respiratory distress. She has no wheezes. She has no rales.  Musculoskeletal: She exhibits no edema.    CBC    Component Value Date/Time   WBC 5.8 02/20/2014 0446   RBC 4.01 02/20/2014 0446   HGB 10.9* 02/20/2014 0446   HCT 32.6* 02/20/2014 0446   PLT 244 02/20/2014 0446   MCV 81.3 02/20/2014 0446   LYMPHSABS 1.2 01/14/2014 0917   MONOABS 0.9 01/14/2014 0917   EOSABS 0.0 01/14/2014 0917   BASOSABS 0.0 01/14/2014 0917    CMP     Component Value Date/Time   NA 134* 09/26/2014 1017   K 4.4 09/26/2014 1017   CL 98 09/26/2014 1017   CO2 25 09/26/2014 1017   GLUCOSE 173* 09/26/2014 1017   BUN 15 09/26/2014 1017   CREATININE 0.6 09/26/2014 1017   CREATININE 0.44* 02/27/2014 1149   CALCIUM 9.5 09/26/2014 1017   PROT 7.0 03/27/2014 0909   ALBUMIN 3.8 03/27/2014 0909   AST 17 03/27/2014 0909   ALT 13 03/27/2014 0909   ALKPHOS 57 03/27/2014 0909   BILITOT 0.3 03/27/2014 0909   GFRNONAA >89 02/27/2014 1149   GFRNONAA >90 02/20/2014 0446   GFRAA >89 02/27/2014 1149   GFRAA >90 02/20/2014 0446    Lab Results  Component Value Date/Time   CHOL 194 03/27/2014 09:09 AM    No components found for: HGA1C  Lab Results  Component Value Date/Time   AST 17 03/27/2014 09:09 AM    Assessment and Plan  Other specified diabetes mellitus without complications - Plan:  Results for orders placed or performed in visit on 10/09/14  Glucose (CBG)  Result Value Ref Range   POC Glucose 138 (A) 70 - 99 mg/dl  HgB A1c  Result Value Ref Range   Hemoglobin A1C 7.8    Diabetes is still uncontrolled, I have advised patient to increase the dose of insulin to 8 units each bedtime and also advise for diabetes meal planning, increase the dose of Amaryl to 4 mg dailywill repeat her HgB A1c in  3 months, glimepiride (AMARYL) 4 MG tablet  Tobacco use disorder Advised patient to quit smoking  Essential hypertension/CHF Currently patient is being followed by her cardiologist and medications were  adjusted, I have advised patient for DASH diet and follow with cardiology.  Needs flu shot Flu shot given today.  Health Maintenance Flu shot given today  , Return in about 3 months (  around 01/09/2015) for diabetes, hypertension.  Lorayne Marek, MD

## 2014-10-12 ENCOUNTER — Other Ambulatory Visit: Payer: Medicaid Other

## 2014-11-01 ENCOUNTER — Encounter (HOSPITAL_COMMUNITY): Payer: Self-pay | Admitting: Cardiovascular Disease

## 2014-11-09 ENCOUNTER — Encounter: Payer: Self-pay | Admitting: Cardiology

## 2014-11-09 ENCOUNTER — Ambulatory Visit (INDEPENDENT_AMBULATORY_CARE_PROVIDER_SITE_OTHER): Payer: Medicaid Other | Admitting: Cardiology

## 2014-11-09 ENCOUNTER — Other Ambulatory Visit (INDEPENDENT_AMBULATORY_CARE_PROVIDER_SITE_OTHER): Payer: Medicaid Other | Admitting: *Deleted

## 2014-11-09 VITALS — BP 128/68 | HR 64 | Ht 66.0 in | Wt 160.0 lb

## 2014-11-09 DIAGNOSIS — I2583 Coronary atherosclerosis due to lipid rich plaque: Secondary | ICD-10-CM

## 2014-11-09 DIAGNOSIS — I2510 Atherosclerotic heart disease of native coronary artery without angina pectoris: Secondary | ICD-10-CM | POA: Insufficient documentation

## 2014-11-09 DIAGNOSIS — E785 Hyperlipidemia, unspecified: Secondary | ICD-10-CM

## 2014-11-09 DIAGNOSIS — I5022 Chronic systolic (congestive) heart failure: Secondary | ICD-10-CM

## 2014-11-09 DIAGNOSIS — I10 Essential (primary) hypertension: Secondary | ICD-10-CM

## 2014-11-09 HISTORY — DX: Atherosclerotic heart disease of native coronary artery without angina pectoris: I25.10

## 2014-11-09 LAB — HEPATIC FUNCTION PANEL
ALK PHOS: 67 U/L (ref 39–117)
ALT: 12 U/L (ref 0–35)
AST: 16 U/L (ref 0–37)
Albumin: 3.9 g/dL (ref 3.5–5.2)
BILIRUBIN TOTAL: 0.6 mg/dL (ref 0.2–1.2)
Bilirubin, Direct: 0.1 mg/dL (ref 0.0–0.3)
Total Protein: 7.5 g/dL (ref 6.0–8.3)

## 2014-11-09 LAB — LIPID PANEL
Cholesterol: 236 mg/dL — ABNORMAL HIGH (ref 0–200)
HDL: 39.6 mg/dL (ref >39.00)
LDL Cholesterol: 172 mg/dL — ABNORMAL HIGH (ref 0–99)
NONHDL: 196.4
Total CHOL/HDL Ratio: 6
Triglycerides: 123 mg/dL (ref 0.0–149.0)
VLDL: 24.6 mg/dL (ref 0.0–40.0)

## 2014-11-09 NOTE — Patient Instructions (Signed)
The current medical regimen is effective;  continue present plan and medications.  Follow up in 4 months with Dr. Skains.  You will receive a letter in the mail 2 months before you are due.  Please call us when you receive this letter to schedule your follow up appointment.  

## 2014-11-09 NOTE — Progress Notes (Signed)
[facility]. [address], [address] [place], [place]  [address] Phone: [phone] Fax:  [phone]  Date:  11/09/2014   ID:  Alicia [name], DOB December 16, 1955, MRN [id number]  PCP:  Lorayne Marek, MD   History of Present Illness: Alicia [name] is a 58 y.o. female with a history of chronic systolic heart failure, diabetes, hypertension, prior stroke here for followup. Previous visit with Richardson Dopp adjusted medication. She's not having any symptoms of heart failure however. She has mowed the grass. No syncope, no retention of fluid. No chest pain.   Studies:  - LHC (02/19/14): Mid RCA 30-40, distal RCA 60, EF 25%.  - Echo (05/11/13): Moderate LVH, EF 30-40%.  - Nuclear (07/23/09 - in [place]): No ischemia.  - Holter (03/2012): NSR, occasional PVCs, no significant arrhythmia  Recent Labs:  02/18/2014: Pro B Natriuretic peptide (BNP) 2876.0*; TSH 1.032  02/20/2014: Hemoglobin 10.9*  03/27/2014: ALT 13; HDL Cholesterol by NMR 49.50; LDL (calc) 135*  04/02/2014: Creatinine 0.5; Potassium 3.8      Wt Readings from Last 3 Encounters:  11/09/14 160 lb (72.576 kg)  10/09/14 161 lb 3.2 oz (73.12 kg)  09/26/14 158 lb (71.668 kg)     Past Medical History  Diagnosis Date  . Diabetes mellitus, type II   . Hypertension   . H/O: CVA (cerebrovascular accident)     6 strokes, most recent on 05/10/13  . Chronic systolic dysfunction of left ventricle   . NICM (nonischemic cardiomyopathy)     Echo (05/2014):  Mod LVH, EF 20-25%, inf HK, mild MR, mild LAE.  . Tobacco abuse   . Noncompliance     Past Surgical History  Procedure Laterality Date  . Tubal ligation    . Cardiac catheterization    . Laparotomy N/A 01/15/2014    Procedure: EXPLORATORY LAPAROTOMY SMALL BOWEL RESECTION;  Surgeon: Imogene Burn. Georgette Dover, MD;  Location: [place];  Service: General;  Laterality: N/A;  . Lysis of adhesion N/A 01/15/2014    Procedure: LYSIS OF ADHESION;  Surgeon: Imogene Burn. Georgette Dover, MD;  Location: [facility] [place];  Service:  General;  Laterality: N/A;  . Left heart catheterization with coronary angiogram N/A 02/19/2014    Procedure: LEFT HEART CATHETERIZATION WITH CORONARY ANGIOGRAM;  Surgeon: Troy Sine, MD;  Location: [facility] C[facility];  Service: Cardiovascular;  Laterality: N/A;    Current Outpatient Prescriptions  Medication Sig Dispense Refill  . amLODipine (NORVASC) 10 MG tablet Take 1 tablet (10 mg total) by mouth daily. 30 tablet 11  . aspirin 81 MG tablet Take 81 mg by mouth daily.    Marland Kitchen atorvastatin (LIPITOR) 20 MG tablet Take 1 tablet (20 mg total) by mouth daily. 90 tablet 3  . carvedilol (COREG) 12.5 MG tablet Take 1 tablet (12.5 mg total) by mouth 2 (two) times daily. 180 tablet 3  . furosemide (LASIX) 40 MG tablet Take 40 mg twice daily x 5 days then go back to 1.5 mg daily 45 tablet 6  . glimepiride (AMARYL) 4 MG tablet Take 1 tablet (4 mg total) by mouth daily before breakfast. 30 tablet 3  . insulin NPH-regular Human (NOVOLIN 70/30) (70-30) 100 UNIT/ML injection Inject 5 Units into the skin at bedtime.     Marland Kitchen lisinopril (PRINIVIL,ZESTRIL) 20 MG tablet Take 1 tablet (20 mg total) by mouth 2 (two) times daily. 60 tablet 4  . polyethylene glycol (MIRALAX / GLYCOLAX) packet Take 17 g by mouth daily as needed for mild constipation.  14 each 0   No current facility-administered medications for this visit.    Allergies:    Allergies  Allergen Reactions  . Asa [Aspirin] Other (See Comments)    Told not to take med-per MD  . Other Other (See Comments)    Told not to take any dairy products-per MD    Social History:  The patient  reports that she has been smoking Cigarettes.  She has a 12.5 pack-year smoking history. She has never used smokeless tobacco. She reports that she does not drink alcohol or use illicit drugs.   Family History  Problem Relation Age of Onset  . Diabetes Mellitus II Mother   . Hypertension Mother   . CVA Mother     ROS:  Please see the history of present illness.   No  syncope, no orthopnea, no chest pain, no PND   All other systems reviewed and negative.   PHYSICAL EXAM: VS:  BP 128/68 mmHg  Pulse 64  Ht 5\' 6"  (1.676 m)  Wt 160 lb (72.576 kg)  BMI 25.84 kg/m2 Well nourished, well developed, in no acute distress HEENT: normal, [place]/AT, EOMI Neck: no JVD, normal carotid upstroke, no bruit Cardiac:  normal S1, S2; RRR; no murmur Lungs:  clear to auscultation bilaterally, no wheezing, rhonchi or rales Abd: soft, nontender, no hepatomegaly, no bruitsAbdominal scar Ext: no edema, 2+ distal pulses Skin: warm and dry GU: deferred Neuro: no focal abnormalities noted, AAO x 3  EKG:  None today     ASSESSMENT AND PLAN:  1. Chronic systolic heart failure-nonischemic. Her blood pressure is well controlled today. She is taking her medications. Reviewed as above. Beta blocker, ACE inhibitor. 2. Hypertension-controlled today. Continue to encourage medication compliance. 3. Coronary artery disease-nonobstructive disease. Continue with secondary prevention. Statin. 4. Prior ventral hernia repair- strangulation. 2/15 5. 4 month followup  6. Appreciate Dr. Rayann Heman consultation. They discussed ICD, no ICD.  Signed, Candee Furbish, MD [facility]  11/09/2014 10:17 AM

## 2014-11-12 ENCOUNTER — Telehealth: Payer: Self-pay | Admitting: Cardiology

## 2014-11-12 DIAGNOSIS — I5022 Chronic systolic (congestive) heart failure: Secondary | ICD-10-CM

## 2014-11-12 DIAGNOSIS — I2510 Atherosclerotic heart disease of native coronary artery without angina pectoris: Secondary | ICD-10-CM

## 2014-11-12 DIAGNOSIS — I2583 Coronary atherosclerosis due to lipid rich plaque: Secondary | ICD-10-CM

## 2014-11-12 DIAGNOSIS — I5032 Chronic diastolic (congestive) heart failure: Secondary | ICD-10-CM

## 2014-11-12 DIAGNOSIS — I10 Essential (primary) hypertension: Secondary | ICD-10-CM

## 2014-11-12 DIAGNOSIS — E785 Hyperlipidemia, unspecified: Secondary | ICD-10-CM

## 2014-11-12 DIAGNOSIS — I5023 Acute on chronic systolic (congestive) heart failure: Secondary | ICD-10-CM

## 2014-11-12 DIAGNOSIS — Z79899 Other long term (current) drug therapy: Secondary | ICD-10-CM

## 2014-11-12 MED ORDER — ATORVASTATIN CALCIUM 40 MG PO TABS: 40.0000 mg | ORAL_TABLET | Freq: Every day | ORAL | Status: DC

## 2014-11-12 NOTE — Telephone Encounter (Signed)
New Msg ° ° ° ° ° ° °Pt returning call. ° ° °Please call back. °

## 2014-11-26 ENCOUNTER — Other Ambulatory Visit: Payer: Self-pay | Admitting: Internal Medicine

## 2014-11-27 ENCOUNTER — Telehealth: Payer: Self-pay | Admitting: Internal Medicine

## 2014-11-27 NOTE — Telephone Encounter (Signed)
Its ok to switch to Pen , check with the pharmacy

## 2014-11-27 NOTE — Telephone Encounter (Signed)
Pt stated not using all her Insulin prescribed and going to waste. Can pt change to the stick pens? P

## 2014-11-27 NOTE — Telephone Encounter (Signed)
Pt walked in to pick up her prescription for insulin and she is stating that she is not using all of her insulin and it is going to waste and is wondering if she can use the stick pens. Please follow up with pt.

## 2014-11-27 NOTE — Telephone Encounter (Signed)
Left voice message to return call [facility] with Pharmacy, [facility] do not have Insulin Pen.

## 2014-12-12 ENCOUNTER — Ambulatory Visit: Payer: Medicaid Other | Attending: Internal Medicine | Admitting: Internal Medicine

## 2014-12-12 ENCOUNTER — Encounter: Payer: Self-pay | Admitting: Internal Medicine

## 2014-12-12 VITALS — BP 130/70 | HR 63 | Temp 98.0°F | Resp 16 | Wt 166.4 lb

## 2014-12-12 DIAGNOSIS — Z794 Long term (current) use of insulin: Secondary | ICD-10-CM | POA: Diagnosis not present

## 2014-12-12 DIAGNOSIS — E1165 Type 2 diabetes mellitus with hyperglycemia: Secondary | ICD-10-CM | POA: Insufficient documentation

## 2014-12-12 DIAGNOSIS — I10 Essential (primary) hypertension: Secondary | ICD-10-CM | POA: Insufficient documentation

## 2014-12-12 DIAGNOSIS — F17200 Nicotine dependence, unspecified, uncomplicated: Secondary | ICD-10-CM

## 2014-12-12 DIAGNOSIS — Z7982 Long term (current) use of aspirin: Secondary | ICD-10-CM | POA: Diagnosis not present

## 2014-12-12 DIAGNOSIS — F17210 Nicotine dependence, cigarettes, uncomplicated: Secondary | ICD-10-CM | POA: Diagnosis not present

## 2014-12-12 DIAGNOSIS — I509 Heart failure, unspecified: Secondary | ICD-10-CM | POA: Insufficient documentation

## 2014-12-12 DIAGNOSIS — I429 Cardiomyopathy, unspecified: Secondary | ICD-10-CM | POA: Insufficient documentation

## 2014-12-12 DIAGNOSIS — E139 Other specified diabetes mellitus without complications: Secondary | ICD-10-CM

## 2014-12-12 DIAGNOSIS — Z8673 Personal history of transient ischemic attack (TIA), and cerebral infarction without residual deficits: Secondary | ICD-10-CM | POA: Insufficient documentation

## 2014-12-12 DIAGNOSIS — Z79899 Other long term (current) drug therapy: Secondary | ICD-10-CM | POA: Insufficient documentation

## 2014-12-12 DIAGNOSIS — Z720 Tobacco use: Secondary | ICD-10-CM

## 2014-12-12 LAB — GLUCOSE, POCT (MANUAL RESULT ENTRY)
POC GLUCOSE: 346 mg/dL — AB (ref 70–99)
POC Glucose: 271 mg/dl — AB (ref 70–99)

## 2014-12-12 MED ORDER — INSULIN ASPART 100 UNIT/ML ~~LOC~~ SOLN
10.0000 [IU] | Freq: Once | SUBCUTANEOUS | Status: AC
  Administered 2014-12-12: 10 [IU] via SUBCUTANEOUS

## 2014-12-12 MED ORDER — INSULIN GLARGINE 100 UNIT/ML SOLOSTAR PEN: 8.0000 [IU] | PEN_INJECTOR | Freq: Every day | SUBCUTANEOUS | Status: DC

## 2014-12-12 MED ORDER — INSULIN PEN NEEDLE 32G X 4 MM MISC: Status: DC

## 2014-12-12 NOTE — Progress Notes (Signed)
Patient here for follow up on her diabetes Complains of using a vial of insulin  And feels she is wasting more than she uses Would like to go back to using the flex pen if possible Presents today with elevated blood sugar Patient states she only had a cup of coffee this am

## 2014-12-12 NOTE — Progress Notes (Signed)
MRN: [id number] Name: Alicia [name]  Sex: female Age: 59 y.o. DOB: 07-17-1956  Allergies: Diona Fanti and Other  Chief Complaint  Patient presents with  . Follow-up    HPI: Patient is 59 y.o. female who has history of diabetes hypertension, CHF, hyperlipidemia comes today for followup, initially her blood pressure was elevated, repeat manual blood pressure is 130/70,as per patient her blood sugar has been running in high, today her blood sugars more than 300 the upper deciliter, she denies any headache dizziness polyuria polydipsia, on the last visit she was advised to increase the dose of insulin to 8 units but she has been taking 5 units, she's requesting prescription for FlexPen, she has used Lantus in the past.patient also follows up with cardiology and her cholesterol medication was recently increased.  Past Medical History  Diagnosis Date  . Diabetes mellitus, type II   . Hypertension   . H/O: CVA (cerebrovascular accident)     6 strokes, most recent on 05/10/13  . Chronic systolic dysfunction of left ventricle   . NICM (nonischemic cardiomyopathy)     Echo (05/2014):  Mod LVH, EF 20-25%, inf HK, mild MR, mild LAE.  . Tobacco abuse   . Noncompliance     Past Surgical History  Procedure Laterality Date  . Tubal ligation    . Cardiac catheterization    . Laparotomy N/A 01/15/2014    Procedure: EXPLORATORY LAPAROTOMY SMALL BOWEL RESECTION;  Surgeon: Imogene Burn. Georgette Dover, MD;  Location: [facility];  Service: General;  Laterality: N/A;  . Lysis of adhesion N/A 01/15/2014    Procedure: LYSIS OF ADHESION;  Surgeon: Imogene Burn. Georgette Dover, MD;  Location: [facility];  Service: General;  Laterality: N/A;  . Left heart catheterization with coronary angiogram N/A 02/19/2014    Procedure: LEFT HEART CATHETERIZATION WITH CORONARY ANGIOGRAM;  Surgeon: Troy Sine, MD;  Location: [facility] CATH LAB;  Service: Cardiovascular;  Laterality: N/A;      Medication List       This list is accurate as of: 12/12/14 11:42  AM.  Always use your most recent med list.               amLODipine 10 MG tablet  Commonly known as:  NORVASC  Take 1 tablet (10 mg total) by mouth daily.     aspirin 81 MG tablet  Take 81 mg by mouth daily.     atorvastatin 40 MG tablet  Commonly known as:  LIPITOR  Take 1 tablet (40 mg total) by mouth daily.     carvedilol 12.5 MG tablet  Commonly known as:  COREG  Take 1 tablet (12.5 mg total) by mouth 2 (two) times daily.     furosemide 40 MG tablet  Commonly known as:  LASIX  Take 40 mg twice daily x 5 days then go back to 1.5 mg daily     glimepiride 4 MG tablet  Commonly known as:  AMARYL  Take 1 tablet (4 mg total) by mouth daily before breakfast.     Insulin Glargine 100 UNIT/ML Solostar Pen  Commonly known as:  LANTUS  Inject 8-10 Units into the skin daily at 10 pm.     insulin NPH-regular Human (70-30) 100 UNIT/ML injection  Commonly known as:  NOVOLIN 70/30  Inject 5 Units into the skin at bedtime.     Insulin Pen Needle 32G X 4 MM Misc  Use 4x a day     lisinopril 20 MG tablet  Commonly known  as:  PRINIVIL,ZESTRIL  Take 1 tablet (20 mg total) by mouth 2 (two) times daily.     polyethylene glycol packet  Commonly known as:  MIRALAX / GLYCOLAX  Take 17 g by mouth daily as needed for mild constipation.        Meds ordered this encounter  Medications  . insulin aspart (novoLOG) injection 10 Units    Sig:   . Insulin Glargine (LANTUS) 100 UNIT/ML Solostar Pen    Sig: Inject 8-10 Units into the skin daily at 10 pm.    Dispense:  15 mL    Refill:  3  . Insulin Pen Needle 32G X 4 MM MISC    Sig: Use 4x a day    Dispense:  200 each    Refill:  5    Immunization History  Administered Date(s) Administered  . Influenza,inj,Quad PF,36+ Mos 10/16/2013, 10/09/2014    Family History  Problem Relation Age of Onset  . Diabetes Mellitus II Mother   . Hypertension Mother   . CVA Mother     History  Substance Use Topics  . Smoking status: Current  Every Day Smoker -- 0.50 packs/day for 25 years    Types: Cigarettes  . Smokeless tobacco: Never Used  . Alcohol Use: No    Review of Systems   As noted in HPI  Filed Vitals:   12/12/14 1034  BP: 130/70  Pulse:   Temp:   Resp:     Physical Exam  Physical Exam  Constitutional: No distress.  Cardiovascular: Normal rate and regular rhythm.   Pulmonary/Chest: Breath sounds normal. No respiratory distress. She has no wheezes. She has no rales.  Musculoskeletal: She exhibits no edema.    CBC    Component Value Date/Time   WBC 5.8 02/20/2014 0446   RBC 4.01 02/20/2014 0446   HGB 10.9* 02/20/2014 0446   HCT 32.6* 02/20/2014 0446   PLT 244 02/20/2014 0446   MCV 81.3 02/20/2014 0446   LYMPHSABS 1.2 01/14/2014 0917   MONOABS 0.9 01/14/2014 0917   EOSABS 0.0 01/14/2014 0917   BASOSABS 0.0 01/14/2014 0917    CMP     Component Value Date/Time   NA 134* 09/26/2014 1017   K 4.4 09/26/2014 1017   CL 98 09/26/2014 1017   CO2 25 09/26/2014 1017   GLUCOSE 173* 09/26/2014 1017   BUN 15 09/26/2014 1017   CREATININE 0.6 09/26/2014 1017   CREATININE 0.44* 02/27/2014 1149   CALCIUM 9.5 09/26/2014 1017   PROT 7.5 11/09/2014 0959   ALBUMIN 3.9 11/09/2014 0959   AST 16 11/09/2014 0959   ALT 12 11/09/2014 0959   ALKPHOS 67 11/09/2014 0959   BILITOT 0.6 11/09/2014 0959   GFRNONAA >89 02/27/2014 1149   GFRNONAA >90 02/20/2014 0446   GFRAA >89 02/27/2014 1149   GFRAA >90 02/20/2014 0446    Lab Results  Component Value Date/Time   CHOL 236* 11/09/2014 09:59 AM    No components found for: HGA1C  Lab Results  Component Value Date/Time   AST 16 11/09/2014 09:59 AM    Assessment and Plan  Other specified diabetes mellitus without complications - Plan:  Results for orders placed or performed in visit on 12/12/14  Glucose (CBG)  Result Value Ref Range   POC Glucose 346 (A) 70 - 99 mg/dl  Glucose (CBG)  Result Value Ref Range   POC Glucose 271 (A) 70 - 99 mg/dl    Patient has hyperglycemia, was given insulin her blood sugar improved,  I have advised patient for diabetes meal planning, I have switched her to Lantus to start taking 8-10 units each bedtime and she will titrate up, patient will keep the fingerstick log and bring it in 2 weeks for nurse visit Glucose (CBG), insulin aspart (novoLOG) injection 10 Units, Insulin Glargine (LANTUS) 100 UNIT/ML Solostar Pen, Insulin Pen Needle 32G X 4 MM MISC, Glucose (CBG),  Essential hypertension/CHF. Blood pressure is well-controlled continued current meds.  Tobacco use disorder Again counseled patient to quit smoking.  Health Maintenance  -Vaccinations:  uptodate with flu shot   Return in about 3 months (around 03/13/2015) for diabetes, hypertension, CBG check in 2 weeks/Nurse Visit.  Lorayne Marek, MD

## 2014-12-12 NOTE — Patient Instructions (Signed)
Diabetes Mellitus and Food It is important for you to manage your blood sugar (glucose) level. Your blood glucose level can be greatly affected by what you eat. Eating healthier foods in the appropriate amounts throughout the day at about the same time each day will help you control your blood glucose level. It can also help slow or prevent worsening of your diabetes mellitus. Healthy eating may even help you improve the level of your blood pressure and reach or maintain a healthy weight.  HOW CAN FOOD AFFECT ME? Carbohydrates Carbohydrates affect your blood glucose level more than any other type of food. Your dietitian will help you determine how many carbohydrates to eat at each meal and teach you how to count carbohydrates. Counting carbohydrates is important to keep your blood glucose at a healthy level, especially if you are using insulin or taking certain medicines for diabetes mellitus. Alcohol Alcohol can cause sudden decreases in blood glucose (hypoglycemia), especially if you use insulin or take certain medicines for diabetes mellitus. Hypoglycemia can be a life-threatening condition. Symptoms of hypoglycemia (sleepiness, dizziness, and disorientation) are similar to symptoms of having too much alcohol.  If your health care provider has given you approval to drink alcohol, do so in moderation and use the following guidelines:  Women should not have more than one drink per day, and men should not have more than two drinks per day. One drink is equal to:  12 oz of beer.  5 oz of wine.  1 oz of hard liquor.  Do not drink on an empty stomach.  Keep yourself hydrated. Have water, diet soda, or unsweetened iced tea.  Regular soda, juice, and other mixers might contain a lot of carbohydrates and should be counted. WHAT FOODS ARE NOT RECOMMENDED? As you make food choices, it is important to remember that all foods are not the same. Some foods have fewer nutrients per serving than other  foods, even though they might have the same number of calories or carbohydrates. It is difficult to get your body what it needs when you eat foods with fewer nutrients. Examples of foods that you should avoid that are high in calories and carbohydrates but low in nutrients include:  Trans fats (most processed foods list trans fats on the Nutrition Facts label).  Regular soda.  Juice.  Candy.  Sweets, such as cake, pie, doughnuts, and cookies.  Fried foods. WHAT FOODS CAN I EAT? Have nutrient-rich foods, which will nourish your body and keep you healthy. The food you should eat also will depend on several factors, including:  The calories you need.  The medicines you take.  Your weight.  Your blood glucose level.  Your blood pressure level.  Your cholesterol level. You also should eat a variety of foods, including:  Protein, such as meat, poultry, fish, tofu, nuts, and seeds (lean animal proteins are best).  Fruits.  Vegetables.  Dairy products, such as milk, cheese, and yogurt (low fat is best).  Breads, grains, pasta, cereal, rice, and beans.  Fats such as olive oil, trans fat-free margarine, canola oil, avocado, and olives. DOES EVERYONE WITH DIABETES MELLITUS HAVE THE SAME MEAL PLAN? Because every person with diabetes mellitus is different, there is not one meal plan that works for everyone. It is very important that you meet with a dietitian who will help you create a meal plan that is just right for you. Document Released: 08/06/2005 Document Revised: 11/14/2013 Document Reviewed: 10/06/2013 ExitCare Patient Information 2015 [employer]. This   information is not intended to replace advice given to you by your health care provider. Make sure you discuss any questions you have with your health care provider. DASH Eating Plan DASH stands for "Dietary Approaches to Stop Hypertension." The DASH eating plan is a healthy eating plan that has been shown to reduce high  blood pressure (hypertension). Additional health benefits may include reducing the risk of type 2 diabetes mellitus, heart disease, and stroke. The DASH eating plan may also help with weight loss. WHAT DO I NEED TO KNOW ABOUT THE DASH EATING PLAN? For the DASH eating plan, you will follow these general guidelines:  Choose foods with a percent daily value for sodium of less than 5% (as listed on the food label).  Use salt-free seasonings or herbs instead of table salt or sea salt.  Check with your health care provider or pharmacist before using salt substitutes.  Eat lower-sodium products, often labeled as "lower sodium" or "no salt added."  Eat fresh foods.  Eat more vegetables, fruits, and low-fat dairy products.  Choose whole grains. Look for the word "whole" as the first word in the ingredient list.  Choose fish and skinless chicken or turkey more often than red meat. Limit fish, poultry, and meat to 6 oz (170 g) each day.  Limit sweets, desserts, sugars, and sugary drinks.  Choose heart-healthy fats.  Limit cheese to 1 oz (28 g) per day.  Eat more home-cooked food and less restaurant, buffet, and fast food.  Limit fried foods.  Cook foods using methods other than frying.  Limit canned vegetables. If you do use them, rinse them well to decrease the sodium.  When eating at a restaurant, ask that your food be prepared with less salt, or no salt if possible. WHAT FOODS CAN I EAT? Seek help from a dietitian for individual calorie needs. Grains Whole grain or whole wheat bread. Brown rice. Whole grain or whole wheat pasta. Quinoa, bulgur, and whole grain cereals. Low-sodium cereals. Corn or whole wheat flour tortillas. Whole grain cornbread. Whole grain crackers. Low-sodium crackers. Vegetables Fresh or frozen vegetables (raw, steamed, roasted, or grilled). Low-sodium or reduced-sodium tomato and vegetable juices. Low-sodium or reduced-sodium tomato sauce and paste. Low-sodium  or reduced-sodium canned vegetables.  Fruits All fresh, canned (in natural juice), or frozen fruits. Meat and Other Protein Products Ground beef (85% or leaner), grass-fed beef, or beef trimmed of fat. Skinless chicken or turkey. Ground chicken or turkey. Pork trimmed of fat. All fish and seafood. Eggs. Dried beans, peas, or lentils. Unsalted nuts and seeds. Unsalted canned beans. Dairy Low-fat dairy products, such as skim or 1% milk, 2% or reduced-fat cheeses, low-fat ricotta or cottage cheese, or plain low-fat yogurt. Low-sodium or reduced-sodium cheeses. Fats and Oils Tub margarines without trans fats. Light or reduced-fat mayonnaise and salad dressings (reduced sodium). Avocado. Safflower, olive, or canola oils. Natural peanut or almond butter. Other Unsalted popcorn and pretzels. The items listed above may not be a complete list of recommended foods or beverages. Contact your dietitian for more options. WHAT FOODS ARE NOT RECOMMENDED? Grains White bread. White pasta. White rice. Refined cornbread. Bagels and croissants. Crackers that contain trans fat. Vegetables Creamed or fried vegetables. Vegetables in a cheese sauce. Regular canned vegetables. Regular canned tomato sauce and paste. Regular tomato and vegetable juices. Fruits Dried fruits. Canned fruit in light or heavy syrup. Fruit juice. Meat and Other Protein Products Fatty cuts of meat. Ribs, chicken wings, bacon, sausage, bologna, salami, chitterlings, fatback, hot   dogs, bratwurst, and packaged luncheon meats. Salted nuts and seeds. Canned beans with salt. Dairy Whole or 2% milk, cream, half-and-half, and cream cheese. Whole-fat or sweetened yogurt. Full-fat cheeses or blue cheese. Nondairy creamers and whipped toppings. Processed cheese, cheese spreads, or cheese curds. Condiments Onion and garlic salt, seasoned salt, table salt, and sea salt. Canned and packaged gravies. Worcestershire sauce. Tartar sauce. Barbecue sauce.  Teriyaki sauce. Soy sauce, including reduced sodium. Steak sauce. Fish sauce. Oyster sauce. Cocktail sauce. Horseradish. Ketchup and mustard. Meat flavorings and tenderizers. Bouillon cubes. Hot sauce. Tabasco sauce. Marinades. Taco seasonings. Relishes. Fats and Oils Butter, stick margarine, lard, shortening, ghee, and bacon fat. Coconut, palm kernel, or palm oils. Regular salad dressings. Other Pickles and olives. Salted popcorn and pretzels. The items listed above may not be a complete list of foods and beverages to avoid. Contact your dietitian for more information. WHERE CAN I FIND MORE INFORMATION? National Heart, Lung, and [employer] Institute: www.nhlbi.nih.gov/health/health-topics/topics/dash/ Document Released: 10/29/2011 Document Revised: 03/26/2014 Document Reviewed: 09/13/2013 ExitCare Patient Information 2015 [employer]. This information is not intended to replace advice given to you by your health care provider. Make sure you discuss any questions you have with your health care provider.  

## 2014-12-27 ENCOUNTER — Ambulatory Visit: Payer: Medicaid Other | Attending: Internal Medicine | Admitting: *Deleted

## 2014-12-27 ENCOUNTER — Other Ambulatory Visit: Payer: Self-pay | Admitting: Internal Medicine

## 2014-12-27 VITALS — BP 156/76 | HR 67 | Temp 97.9°F | Resp 14

## 2014-12-27 DIAGNOSIS — E119 Type 2 diabetes mellitus without complications: Secondary | ICD-10-CM | POA: Diagnosis present

## 2014-12-27 DIAGNOSIS — E1165 Type 2 diabetes mellitus with hyperglycemia: Secondary | ICD-10-CM

## 2014-12-27 DIAGNOSIS — E139 Other specified diabetes mellitus without complications: Secondary | ICD-10-CM

## 2014-12-27 DIAGNOSIS — R739 Hyperglycemia, unspecified: Secondary | ICD-10-CM

## 2014-12-27 LAB — GLUCOSE, POCT (MANUAL RESULT ENTRY)
POC GLUCOSE: 234 mg/dL — AB (ref 70–99)
POC Glucose: 281 mg/dl — AB (ref 70–99)

## 2014-12-27 MED ORDER — INSULIN ASPART 100 UNIT/ML ~~LOC~~ SOLN
10.0000 [IU] | Freq: Once | SUBCUTANEOUS | Status: AC
  Administered 2014-12-27: 10 [IU] via SUBCUTANEOUS

## 2014-12-27 MED ORDER — INSULIN GLARGINE 100 UNIT/ML SOLOSTAR PEN: PEN_INJECTOR | SUBCUTANEOUS | Status: DC

## 2014-12-27 MED ORDER — GLUCOSE BLOOD VI STRP: ORAL_STRIP | Status: DC

## 2014-12-27 NOTE — Patient Instructions (Addendum)
Increase Lantus to 10 units at bedtime. If after 7 days AM fasting blood sugar is still greater than 150, increase lantus to 12 units   Diabetes Mellitus and Food It is important for you to manage your blood sugar (glucose) level. Your blood glucose level can be greatly affected by what you eat. Eating healthier foods in the appropriate amounts throughout the day at about the same time each day will help you control your blood glucose level. It can also help slow or prevent worsening of your diabetes mellitus. Healthy eating may even help you improve the level of your blood pressure and reach or maintain a healthy weight.  HOW CAN FOOD AFFECT ME? Carbohydrates Carbohydrates affect your blood glucose level more than any other type of food. Your dietitian will help you determine how many carbohydrates to eat at each meal and teach you how to count carbohydrates. Counting carbohydrates is important to keep your blood glucose at a healthy level, especially if you are using insulin or taking certain medicines for diabetes mellitus. Alcohol Alcohol can cause sudden decreases in blood glucose (hypoglycemia), especially if you use insulin or take certain medicines for diabetes mellitus. Hypoglycemia can be a life-threatening condition. Symptoms of hypoglycemia (sleepiness, dizziness, and disorientation) are similar to symptoms of having too much alcohol.  If your health care provider has given you approval to drink alcohol, do so in moderation and use the following guidelines:  Women should not have more than one drink per day, and men should not have more than two drinks per day. One drink is equal to:  12 oz of beer.  5 oz of wine.  1 oz of hard liquor.  Do not drink on an empty stomach.  Keep yourself hydrated. Have water, diet soda, or unsweetened iced tea.  Regular soda, juice, and other mixers might contain a lot of carbohydrates and should be counted. WHAT FOODS ARE NOT RECOMMENDED? As you  make food choices, it is important to remember that all foods are not the same. Some foods have fewer nutrients per serving than other foods, even though they might have the same number of calories or carbohydrates. It is difficult to get your body what it needs when you eat foods with fewer nutrients. Examples of foods that you should avoid that are high in calories and carbohydrates but low in nutrients include:  Trans fats (most processed foods list trans fats on the Nutrition Facts label).  Regular soda.  Juice.  Candy.  Sweets, such as cake, pie, doughnuts, and cookies.  Fried foods. WHAT FOODS CAN I EAT? Have nutrient-rich foods, which will nourish your body and keep you healthy. The food you should eat also will depend on several factors, including:  The calories you need.  The medicines you take.  Your weight.  Your blood glucose level.  Your blood pressure level.  Your cholesterol level. You also should eat a variety of foods, including:  Protein, such as meat, poultry, fish, tofu, nuts, and seeds (lean animal proteins are best).  Fruits.  Vegetables.  Dairy products, such as milk, cheese, and yogurt (low fat is best).  Breads, grains, pasta, cereal, rice, and beans.  Fats such as olive oil, trans fat-free margarine, canola oil, avocado, and olives. DOES EVERYONE WITH DIABETES MELLITUS HAVE THE SAME MEAL PLAN? Because every person with diabetes mellitus is different, there is not one meal plan that works for everyone. It is very important that you meet with a dietitian who will help  you create a meal plan that is just right for you. Document Released: 08/06/2005 Document Revised: 11/14/2013 Document Reviewed: 10/06/2013 [facility] Patient Information 2015 [place], [place]. This information is not intended to replace advice given to you by your health care provider. Make sure you discuss any questions you have with your health care provider. Diabetes and  Exercise Exercising regularly is important. It is not just about losing weight. It has many health benefits, such as:  Improving your overall fitness, flexibility, and endurance.  Increasing your bone density.  Helping with weight control.  Decreasing your body fat.  Increasing your muscle strength.  Reducing stress and tension.  Improving your overall health. People with diabetes who exercise gain additional benefits because exercise:  Reduces appetite.  Improves the body's use of blood sugar (glucose).  Helps lower or control blood glucose.  Decreases blood pressure.  Helps control blood lipids (such as cholesterol and triglycerides).  Improves the body's use of the hormone insulin by:  Increasing the body's insulin sensitivity.  Reducing the body's insulin needs.  Decreases the risk for heart disease because exercising:  Lowers cholesterol and triglycerides levels.  Increases the levels of good cholesterol (such as high-density lipoproteins [HDL]) in the body.  Lowers blood glucose levels. YOUR ACTIVITY PLAN  Choose an activity that you enjoy and set realistic goals. Your health care provider or diabetes educator can help you make an activity plan that works for you. Exercise regularly as directed by your health care provider. This includes:  Performing resistance training twice a week such as push-ups, sit-ups, lifting weights, or using resistance bands.  Performing 150 minutes of cardio exercises each week such as walking, running, or playing sports.  Staying active and spending no more than 90 minutes at one time being inactive. Even short bursts of exercise are good for you. Three 10-minute sessions spread throughout the day are just as beneficial as a single 30-minute session. Some exercise ideas include:  Taking the dog for a walk.  Taking the stairs instead of the elevator.  Dancing to your favorite song.  Doing an exercise video.  Doing your  favorite exercise with a friend. RECOMMENDATIONS FOR EXERCISING WITH TYPE 1 OR TYPE 2 DIABETES   Check your blood glucose before exercising. If blood glucose levels are greater than 240 mg/dL, check for urine ketones. Do not exercise if ketones are present.  Avoid injecting insulin into areas of the body that are going to be exercised. For example, avoid injecting insulin into:  The arms when playing tennis.  The legs when jogging.  Keep a record of:  Food intake before and after you exercise.  Expected peak times of insulin action.  Blood glucose levels before and after you exercise.  The type and amount of exercise you have done.  Review your records with your health care provider. Your health care provider will help you to develop guidelines for adjusting food intake and insulin amounts before and after exercising.  If you take insulin or oral hypoglycemic agents, watch for signs and symptoms of hypoglycemia. They include:  Dizziness.  Shaking.  Sweating.  Chills.  Confusion.  Drink plenty of water while you exercise to prevent dehydration or heat stroke. Body water is lost during exercise and must be replaced.  Talk to your health care provider before starting an exercise program to make sure it is safe for you. Remember, almost any type of activity is better than none. Document Released: 01/30/2004 Document Revised: 03/26/2014 Document Reviewed: 04/18/2013  [employer] Patient Information 2015 [place]. This information is not intended to replace advice given to you by your health care provider. Make sure you discuss any questions you have with your health care provider. Basic Carbohydrate Counting for Diabetes Mellitus Carbohydrate counting is a method for keeping track of the amount of carbohydrates you eat. Eating carbohydrates naturally increases the level of sugar (glucose) in your blood, so it is important for you to know the amount that is okay for you to have  in every meal. Carbohydrate counting helps keep the level of glucose in your blood within normal limits. The amount of carbohydrates allowed is different for every person. A dietitian can help you calculate the amount that is right for you. Once you know the amount of carbohydrates you can have, you can count the carbohydrates in the foods you want to eat. Carbohydrates are found in the following foods:  Grains, such as breads and cereals.  Dried beans and soy products.  Starchy vegetables, such as potatoes, peas, and corn.  Fruit and fruit juices.  Milk and yogurt.  Sweets and snack foods, such as cake, cookies, candy, chips, soft drinks, and fruit drinks. CARBOHYDRATE COUNTING There are two ways to count the carbohydrates in your food. You can use either of the methods or a combination of both. Reading the "Nutrition Facts" on [place] The "Nutrition Facts" is an area that is included on the labels of almost all packaged food and beverages in the [place]. It includes the serving size of that food or beverage and information about the nutrients in each serving of the food, including the grams (g) of carbohydrate per serving.  Decide the number of servings of this food or beverage that you will be able to eat or drink. Multiply that number of servings by the number of grams of carbohydrate that is listed on the label for that serving. The total will be the amount of carbohydrates you will be having when you eat or drink this food or beverage. Learning Standard Serving Sizes of Food When you eat food that is not packaged or does not include "Nutrition Facts" on the label, you need to measure the servings in order to count the amount of carbohydrates.A serving of most carbohydrate-rich foods contains about 15 g of carbohydrates. The following list includes serving sizes of carbohydrate-rich foods that provide 15 g ofcarbohydrate per serving:   1 slice of bread (1 oz) or 1 six-inch  tortilla.    of a hamburger bun or English muffin.  4-6 crackers.   cup unsweetened dry cereal.    cup hot cereal.   cup rice or pasta.    cup mashed potatoes or  of a large baked potato.  1 cup fresh fruit or one small piece of fruit.    cup canned or frozen fruit or fruit juice.  1 cup milk.   cup plain fat-free yogurt or yogurt sweetened with artificial sweeteners.   cup cooked dried beans or starchy vegetable, such as peas, corn, or potatoes.  Decide the number of standard-size servings that you will eat. Multiply that number of servings by 15 (the grams of carbohydrates in that serving). For example, if you eat 2 cups of strawberries, you will have eaten 2 servings and 30 g of carbohydrates (2 servings x 15 g = 30 g). For foods such as soups and casseroles, in which more than one food is mixed in, you will need to count the carbohydrates in each  food that is included. EXAMPLE OF CARBOHYDRATE COUNTING Sample Dinner  3 oz chicken breast.   cup of brown rice.   cup of corn.  1 cup milk.   1 cup strawberries with sugar-free whipped topping.  Carbohydrate Calculation Step 1: Identify the foods that contain carbohydrates:   Rice.   Corn.   Milk.   Strawberries. Step 2:Calculate the number of servings eaten of each:   2 servings of rice.   1 serving of corn.   1 serving of milk.   1 serving of strawberries. Step 3: Multiply each of those number of servings by 15 g:   2 servings of rice x 15 g = 30 g.   1 serving of corn x 15 g = 15 g.   1 serving of milk x 15 g = 15 g.   1 serving of strawberries x 15 g = 15 g. Step 4: Add together all of the amounts to find the total grams of carbohydrates eaten: 30 g + 15 g + 15 g + 15 g = 75 g. Document Released: 11/09/2005 Document Revised: 03/26/2014 Document Reviewed: 10/06/2013 [facility] Patient Information 2015 [place], [place]. This information is not intended to replace advice given  to you by your health care provider. Make sure you discuss any questions you have with your health care provider. Smoking Cessation Quitting smoking is important to your health and has many advantages. However, it is not always easy to quit since nicotine is a very addictive drug. Oftentimes, people try 3 times or more before being able to quit. This document explains the best ways for you to prepare to quit smoking. Quitting takes hard work and a lot of effort, but you can do it. ADVANTAGES OF QUITTING SMOKING  You will live longer, feel better, and live better.  Your body will feel the impact of quitting smoking almost immediately.  Within 20 minutes, blood pressure decreases. Your pulse returns to its normal level.  After 8 hours, carbon monoxide levels in the blood return to normal. Your oxygen level increases.  After 24 hours, the chance of having a heart attack starts to decrease. Your breath, hair, and body stop smelling like smoke.  After 48 hours, damaged nerve endings begin to recover. Your sense of taste and smell improve.  After 72 hours, the body is virtually free of nicotine. Your bronchial tubes relax and breathing becomes easier.  After 2 to 12 weeks, lungs can hold more air. Exercise becomes easier and circulation improves.  The risk of having a heart attack, stroke, cancer, or lung disease is greatly reduced.  After 1 year, the risk of coronary heart disease is cut in half.  After 5 years, the risk of stroke falls to the same as a nonsmoker.  After 10 years, the risk of lung cancer is cut in half and the risk of other cancers decreases significantly.  After 15 years, the risk of coronary heart disease drops, usually to the level of a nonsmoker.  If you are pregnant, quitting smoking will improve your chances of having a healthy baby.  The people you live with, especially any children, will be healthier.  You will have extra money to spend on things other than  cigarettes. QUESTIONS TO THINK ABOUT BEFORE ATTEMPTING TO QUIT You may want to talk about your answers with your health care provider.  Why do you want to quit?  If you tried to quit in the past, what helped and what did not?  What will be the most difficult situations for you after you quit? How will you plan to handle them?  Who can help you through the tough times? Your family? Friends? A health care provider?  What pleasures do you get from smoking? What ways can you still get pleasure if you quit? Here are some questions to ask your health care provider:  How can you help me to be successful at quitting?  What medicine do you think would be best for me and how should I take it?  What should I do if I need more help?  What is smoking withdrawal like? How can I get information on withdrawal? GET READY  Set a quit date.  Change your environment by getting rid of all cigarettes, ashtrays, matches, and lighters in your home, car, or work. Do not let people smoke in your home.  Review your past attempts to quit. Think about what worked and what did not. GET SUPPORT AND ENCOURAGEMENT You have a better chance of being successful if you have help. You can get support in many ways.  Tell your family, friends, and coworkers that you are going to quit and need their support. Ask them not to smoke around you.  Get individual, group, or telephone counseling and support. Programs are available at [employer] and health centers. Call your local health department for information about programs in your area.  Spiritual beliefs and practices may help some smokers quit.  Download a "quit meter" on your computer to keep track of quit statistics, such as how long you have gone without smoking, cigarettes not smoked, and money saved.  Get a self-help book about quitting smoking and staying off tobacco. [place] yourself from urges to smoke. Talk to  someone, go for a walk, or occupy your time with a task.  Change your normal routine. Take a different route to work. Drink tea instead of coffee. Eat breakfast in a different place.  Reduce your stress. Take a hot bath, exercise, or read a book.  Plan something enjoyable to do every day. Reward yourself for not smoking.  Explore interactive web-based programs that specialize in helping you quit. GET MEDICINE AND USE IT CORRECTLY Medicines can help you stop smoking and decrease the urge to smoke. Combining medicine with the above behavioral methods and support can greatly increase your chances of successfully quitting smoking.  Nicotine replacement therapy helps deliver nicotine to your body without the negative effects and risks of smoking. Nicotine replacement therapy includes nicotine gum, lozenges, inhalers, nasal sprays, and skin patches. Some may be available over-the-counter and others require a prescription.  Antidepressant medicine helps people abstain from smoking, but how this works is unknown. This medicine is available by prescription.  Nicotinic receptor partial agonist medicine simulates the effect of nicotine in your brain. This medicine is available by prescription. Ask your health care provider for advice about which medicines to use and how to use them based on your health history. Your health care provider will tell you what side effects to look out for if you choose to be on a medicine or therapy. Carefully read the information on the package. Do not use any other product containing nicotine while using a nicotine replacement product.  RELAPSE OR DIFFICULT SITUATIONS Most relapses occur within the first 3 months after quitting. Do not be discouraged if you start smoking again. Remember, most people try several times before finally quitting. You may have symptoms of  withdrawal because your body is used to nicotine. You may crave cigarettes, be irritable, feel very hungry, cough  often, get headaches, or have difficulty concentrating. The withdrawal symptoms are only temporary. They are strongest when you first quit, but they will go away within 10-14 days. To reduce the chances of relapse, try to:  Avoid drinking alcohol. Drinking lowers your chances of successfully quitting.  Reduce the amount of caffeine you consume. Once you quit smoking, the amount of caffeine in your body increases and can give you symptoms, such as a rapid heartbeat, sweating, and anxiety.  Avoid smokers because they can make you want to smoke.  Do not let weight gain distract you. Many smokers will gain weight when they quit, usually less than 10 pounds. Eat a healthy diet and stay active. You can always lose the weight gained after you quit.  Find ways to improve your mood other than smoking. FOR MORE INFORMATION  www.smokefree.gov  Document Released: 11/03/2001 Document Revised: 03/26/2014 Document Reviewed: 02/18/2012 [facility] Patient Information 2015 [place], [place]. This information is not intended to replace advice given to you by your health care provider. Make sure you discuss any questions you have with your health care provider. DASH Eating Plan DASH stands for "Dietary Approaches to Stop Hypertension." The DASH eating plan is a healthy eating plan that has been shown to reduce high blood pressure (hypertension). Additional health benefits may include reducing the risk of type 2 diabetes mellitus, heart disease, and stroke. The DASH eating plan may also help with weight loss. WHAT DO I NEED TO KNOW ABOUT THE DASH EATING PLAN? For the DASH eating plan, you will follow these general guidelines:  Choose foods with a percent daily value for sodium of less than 5% (as listed on the food label).  Use salt-free seasonings or herbs instead of table salt or sea salt.  Check with your health care provider or pharmacist before using salt substitutes.  Eat lower-sodium products, often labeled  as "lower sodium" or "no salt added."  Eat fresh foods.  Eat more vegetables, fruits, and low-fat dairy products.  Choose whole grains. Look for the word "whole" as the first word in the ingredient list.  Choose fish and skinless chicken or [place] more often than red meat. Limit fish, poultry, and meat to 6 oz (170 g) each day.  Limit sweets, desserts, sugars, and sugary drinks.  Choose heart-healthy fats.  Limit cheese to 1 oz (28 g) per day.  Eat more home-cooked food and less restaurant, buffet, and fast food.  Limit fried foods.  Cook foods using methods other than frying.  Limit canned vegetables. If you do use them, rinse them well to decrease the sodium.  When eating at a restaurant, ask that your food be prepared with less salt, or no salt if possible. WHAT FOODS CAN I EAT? Seek help from a dietitian for individual calorie needs. Grains Whole grain or whole wheat bread. Brown rice. Whole grain or whole wheat pasta. Quinoa, bulgur, and whole grain cereals. Low-sodium cereals. Corn or whole wheat flour tortillas. Whole grain cornbread. Whole grain crackers. Low-sodium crackers. Vegetables Fresh or frozen vegetables (raw, steamed, roasted, or grilled). Low-sodium or reduced-sodium tomato and vegetable juices. Low-sodium or reduced-sodium tomato sauce and paste. Low-sodium or reduced-sodium canned vegetables.  Fruits All fresh, canned (in natural juice), or frozen fruits. Meat and Other Protein Products Ground beef (85% or leaner), grass-fed beef, or beef trimmed of fat. Skinless chicken or [place]. Ground chicken or [place].  Pork trimmed of fat. All fish and seafood. Eggs. Dried beans, peas, or lentils. Unsalted nuts and seeds. Unsalted canned beans. Dairy Low-fat dairy products, such as skim or 1% milk, 2% or reduced-fat cheeses, low-fat ricotta or cottage cheese, or plain low-fat yogurt. Low-sodium or reduced-sodium cheeses. Fats and Oils Tub margarines without trans fats.  Light or reduced-fat mayonnaise and salad dressings (reduced sodium). Avocado. Safflower, olive, or canola oils. Natural peanut or almond butter. Other Unsalted popcorn and pretzels. The items listed above may not be a complete list of recommended foods or beverages. Contact your dietitian for more options. WHAT FOODS ARE NOT RECOMMENDED? Grains White bread. White pasta. White rice. Refined cornbread. Bagels and croissants. Crackers that contain trans fat. Vegetables Creamed or fried vegetables. Vegetables in a cheese sauce. Regular canned vegetables. Regular canned tomato sauce and paste. Regular tomato and vegetable juices. Fruits Dried fruits. Canned fruit in light or heavy syrup. Fruit juice. Meat and Other Protein Products Fatty cuts of meat. Ribs, chicken wings, bacon, sausage, bologna, salami, chitterlings, fatback, hot dogs, bratwurst, and packaged luncheon meats. Salted nuts and seeds. Canned beans with salt. Dairy Whole or 2% milk, cream, half-and-half, and cream cheese. Whole-fat or sweetened yogurt. Full-fat cheeses or blue cheese. Nondairy creamers and whipped toppings. Processed cheese, cheese spreads, or cheese curds. Condiments Onion and garlic salt, seasoned salt, table salt, and sea salt. Canned and packaged gravies. Worcestershire sauce. Tartar sauce. Barbecue sauce. Teriyaki sauce. Soy sauce, including reduced sodium. Steak sauce. Fish sauce. Oyster sauce. Cocktail sauce. Horseradish. Ketchup and mustard. Meat flavorings and tenderizers. Bouillon cubes. Hot sauce. Tabasco sauce. Marinades. Taco seasonings. Relishes. Fats and Oils Butter, stick margarine, lard, shortening, ghee, and bacon fat. Coconut, palm kernel, or palm oils. Regular salad dressings. Other Pickles and olives. Salted popcorn and pretzels. The items listed above may not be a complete list of foods and beverages to avoid. Contact your dietitian for more information. WHERE CAN I FIND MORE  INFORMATION? National Heart, Lung, and Blood Institute: travelstabloid.com Document Released: 10/29/2011 Document Revised: 03/26/2014 Document Reviewed: 09/13/2013 [facility] Patient Information 2015 [place], [place]. This information is not intended to replace advice given to you by your health care provider. Make sure you discuss any questions you have with your health care provider.

## 2014-12-27 NOTE — Progress Notes (Signed)
Patient presents for CBG and record review, however, patient did not bring log or meter Med list reviewed; patient reports taking all meds as directed. States taking 8 units lantus qHS Patient reports AM fasting blood sugars ranging 182-355 Patient reports before bed blood sugars ranging 150-160 Smoking 15 cigs/day down from 1 ppd  CBG 281 Patient states this is down from 355 this AM at home  BP 156/76 Has not yet had AM dose of amlodipine P 67 R 14 T  97.9 oral SpO2 100%  10 units novolog insulin given SQ per protocol and PCP  Per PCP: Increase Lantus to 10 units at bedtime. If after 7 days AM fasting blood sugar is still greater than 150, increase lantus to 12 units Return in 2 weeks for nurse visit for CBG and record review  Patient given literature on DASH Eating Plan, Smoking Cessation, Diabetes and Food, Diabetes and Exercise, and Basic Carb Counting  Patient advised to call for med refills at least 7 days before running out so as not to go without. Patient aware that she is to f/u with PCP 3 months from last visit. Due 03/13/2015  CBG 234 30 minutes after insulin admin. Patient discharged to home in stable condition

## 2015-01-11 ENCOUNTER — Other Ambulatory Visit: Payer: Self-pay | Admitting: Internal Medicine

## 2015-01-11 ENCOUNTER — Ambulatory Visit: Payer: Medicaid Other | Attending: Internal Medicine | Admitting: *Deleted

## 2015-01-11 VITALS — BP 130/75 | HR 61 | Temp 98.1°F | Resp 14

## 2015-01-11 DIAGNOSIS — E119 Type 2 diabetes mellitus without complications: Secondary | ICD-10-CM | POA: Insufficient documentation

## 2015-01-11 DIAGNOSIS — E871 Hypo-osmolality and hyponatremia: Secondary | ICD-10-CM | POA: Insufficient documentation

## 2015-01-11 DIAGNOSIS — I10 Essential (primary) hypertension: Secondary | ICD-10-CM | POA: Diagnosis not present

## 2015-01-11 DIAGNOSIS — Z794 Long term (current) use of insulin: Secondary | ICD-10-CM | POA: Insufficient documentation

## 2015-01-11 DIAGNOSIS — E1165 Type 2 diabetes mellitus with hyperglycemia: Secondary | ICD-10-CM

## 2015-01-11 DIAGNOSIS — E139 Other specified diabetes mellitus without complications: Secondary | ICD-10-CM

## 2015-01-11 DIAGNOSIS — Z720 Tobacco use: Secondary | ICD-10-CM | POA: Insufficient documentation

## 2015-01-11 LAB — GLUCOSE, POCT (MANUAL RESULT ENTRY): POC Glucose: 199 mg/dl — AB (ref 70–99)

## 2015-01-11 LAB — POCT GLYCOSYLATED HEMOGLOBIN (HGB A1C): HEMOGLOBIN A1C: 12

## 2015-01-11 MED ORDER — INSULIN GLARGINE 100 UNIT/ML SOLOSTAR PEN: PEN_INJECTOR | SUBCUTANEOUS | Status: DC

## 2015-01-11 NOTE — Progress Notes (Signed)
Patient presents for CBG and record review after increasing lantus to 12 units q HS Med list reviewed; patient reports taking all meds as directed except ran out of lisinopril 7 days ago Patient's AM fasting blood sugars ranging 191-401 Patient's PM blood sugars ranging 260-375 Smoking 15 cigs/day; trying to quit  CBG 199 HgB A1c 12.0  BP 130/75 P 61 R 14 T  98.1 oral SpO2 100%  Per PCP: Increase Lantus to 15 units at bedtime. If after 7 days AM fasting blood sugar is still greater than 150, increase lantus to 17 units  Referral to Diabetic Nutrition and Education placed  Return in 2 weeks for nurse visit for CBG and record review  Patient advised to call for med refills at least 7 days before running out so as not to go without.

## 2015-01-11 NOTE — Patient Instructions (Signed)
Smoking Cessation Quitting smoking is important to your health and has many advantages. However, it is not always easy to quit since nicotine is a very addictive drug. Oftentimes, people try 3 times or more before being able to quit. This document explains the best ways for you to prepare to quit smoking. Quitting takes hard work and a lot of effort, but you can do it. ADVANTAGES OF QUITTING SMOKING  You will live longer, feel better, and live better.  Your body will feel the impact of quitting smoking almost immediately.  Within 20 minutes, blood pressure decreases. Your pulse returns to its normal level.  After 8 hours, carbon monoxide levels in the blood return to normal. Your oxygen level increases.  After 24 hours, the chance of having a heart attack starts to decrease. Your breath, hair, and body stop smelling like smoke.  After 48 hours, damaged nerve endings begin to recover. Your sense of taste and smell improve.  After 72 hours, the body is virtually free of nicotine. Your bronchial tubes relax and breathing becomes easier.  After 2 to 12 weeks, lungs can hold more air. Exercise becomes easier and circulation improves.  The risk of having a heart attack, stroke, cancer, or lung disease is greatly reduced.  After 1 year, the risk of coronary heart disease is cut in half.  After 5 years, the risk of stroke falls to the same as a nonsmoker.  After 10 years, the risk of lung cancer is cut in half and the risk of other cancers decreases significantly.  After 15 years, the risk of coronary heart disease drops, usually to the level of a nonsmoker.  If you are pregnant, quitting smoking will improve your chances of having a healthy baby.  The people you live with, especially any children, will be healthier.  You will have extra money to spend on things other than cigarettes. QUESTIONS TO THINK ABOUT BEFORE ATTEMPTING TO QUIT You may want to talk about your answers with your  health care provider.  Why do you want to quit?  If you tried to quit in the past, what helped and what did not?  What will be the most difficult situations for you after you quit? How will you plan to handle them?  Who can help you through the tough times? Your family? Friends? A health care provider?  What pleasures do you get from smoking? What ways can you still get pleasure if you quit? Here are some questions to ask your health care provider:  How can you help me to be successful at quitting?  What medicine do you think would be best for me and how should I take it?  What should I do if I need more help?  What is smoking withdrawal like? How can I get information on withdrawal? GET READY  Set a quit date.  Change your environment by getting rid of all cigarettes, ashtrays, matches, and lighters in your home, car, or work. Do not let people smoke in your home.  Review your past attempts to quit. Think about what worked and what did not. GET SUPPORT AND ENCOURAGEMENT You have a better chance of being successful if you have help. You can get support in many ways.  Tell your family, friends, and coworkers that you are going to quit and need their support. Ask them not to smoke around you.  Get individual, group, or telephone counseling and support. Programs are available at local hospitals and health centers. Call   your local health department for information about programs in your area.  Spiritual beliefs and practices may help some smokers quit.  Download a "quit meter" on your computer to keep track of quit statistics, such as how long you have gone without smoking, cigarettes not smoked, and money saved.  Get a self-help book about quitting smoking and staying off tobacco. LEARN NEW SKILLS AND BEHAVIORS  Distract yourself from urges to smoke. Talk to someone, go for a walk, or occupy your time with a task.  Change your normal routine. Take a different route to work.  Drink tea instead of coffee. Eat breakfast in a different place.  Reduce your stress. Take a hot bath, exercise, or read a book.  Plan something enjoyable to do every day. Reward yourself for not smoking.  Explore interactive web-based programs that specialize in helping you quit. GET MEDICINE AND USE IT CORRECTLY Medicines can help you stop smoking and decrease the urge to smoke. Combining medicine with the above behavioral methods and support can greatly increase your chances of successfully quitting smoking.  Nicotine replacement therapy helps deliver nicotine to your body without the negative effects and risks of smoking. Nicotine replacement therapy includes nicotine gum, lozenges, inhalers, nasal sprays, and skin patches. Some may be available over-the-counter and others require a prescription.  Antidepressant medicine helps people abstain from smoking, but how this works is unknown. This medicine is available by prescription.  Nicotinic receptor partial agonist medicine simulates the effect of nicotine in your brain. This medicine is available by prescription. Ask your health care provider for advice about which medicines to use and how to use them based on your health history. Your health care provider will tell you what side effects to look out for if you choose to be on a medicine or therapy. Carefully read the information on the package. Do not use any other product containing nicotine while using a nicotine replacement product.  RELAPSE OR DIFFICULT SITUATIONS Most relapses occur within the first 3 months after quitting. Do not be discouraged if you start smoking again. Remember, most people try several times before finally quitting. You may have symptoms of withdrawal because your body is used to nicotine. You may crave cigarettes, be irritable, feel very hungry, cough often, get headaches, or have difficulty concentrating. The withdrawal symptoms are only temporary. They are strongest  when you first quit, but they will go away within 10-14 days. To reduce the chances of relapse, try to:  Avoid drinking alcohol. Drinking lowers your chances of successfully quitting.  Reduce the amount of caffeine you consume. Once you quit smoking, the amount of caffeine in your body increases and can give you symptoms, such as a rapid heartbeat, sweating, and anxiety.  Avoid smokers because they can make you want to smoke.  Do not let weight gain distract you. Many smokers will gain weight when they quit, usually less than 10 pounds. Eat a healthy diet and stay active. You can always lose the weight gained after you quit.  Find ways to improve your mood other than smoking. FOR MORE INFORMATION  www.smokefree.gov  Document Released: 11/03/2001 Document Revised: 03/26/2014 Document Reviewed: 02/18/2012 [employer] Patient Information 2015 [employer]. This information is not intended to replace advice given to you by your health care provider. Make sure you discuss any questions you have with your health care provider. Diabetes Mellitus and Food It is important for you to manage your blood sugar (glucose) level. Your blood glucose level can be   greatly affected by what you eat. Eating healthier foods in the appropriate amounts throughout the day at about the same time each day will help you control your blood glucose level. It can also help slow or prevent worsening of your diabetes mellitus. Healthy eating may even help you improve the level of your blood pressure and reach or maintain a healthy weight.  HOW CAN FOOD AFFECT ME? Carbohydrates Carbohydrates affect your blood glucose level more than any other type of food. Your dietitian will help you determine how many carbohydrates to eat at each meal and teach you how to count carbohydrates. Counting carbohydrates is important to keep your blood glucose at a healthy level, especially if you are using insulin or taking certain medicines for  diabetes mellitus. Alcohol Alcohol can cause sudden decreases in blood glucose (hypoglycemia), especially if you use insulin or take certain medicines for diabetes mellitus. Hypoglycemia can be a life-threatening condition. Symptoms of hypoglycemia (sleepiness, dizziness, and disorientation) are similar to symptoms of having too much alcohol.  If your health care provider has given you approval to drink alcohol, do so in moderation and use the following guidelines:  Women should not have more than one drink per day, and men should not have more than two drinks per day. One drink is equal to:  12 oz of beer.  5 oz of wine.  1 oz of hard liquor.  Do not drink on an empty stomach.  Keep yourself hydrated. Have water, diet soda, or unsweetened iced tea.  Regular soda, juice, and other mixers might contain a lot of carbohydrates and should be counted. WHAT FOODS ARE NOT RECOMMENDED? As you make food choices, it is important to remember that all foods are not the same. Some foods have fewer nutrients per serving than other foods, even though they might have the same number of calories or carbohydrates. It is difficult to get your body what it needs when you eat foods with fewer nutrients. Examples of foods that you should avoid that are high in calories and carbohydrates but low in nutrients include:  Trans fats (most processed foods list trans fats on the Nutrition Facts label).  Regular soda.  Juice.  Candy.  Sweets, such as cake, pie, doughnuts, and cookies.  Fried foods. WHAT FOODS CAN I EAT? Have nutrient-rich foods, which will nourish your body and keep you healthy. The food you should eat also will depend on several factors, including:  The calories you need.  The medicines you take.  Your weight.  Your blood glucose level.  Your blood pressure level.  Your cholesterol level. You also should eat a variety of foods, including:  Protein, such as meat, poultry, fish,  tofu, nuts, and seeds (lean animal proteins are best).  Fruits.  Vegetables.  Dairy products, such as milk, cheese, and yogurt (low fat is best).  Breads, grains, pasta, cereal, rice, and beans.  Fats such as olive oil, trans fat-free margarine, canola oil, avocado, and olives. DOES EVERYONE WITH DIABETES MELLITUS HAVE THE SAME MEAL PLAN? Because every person with diabetes mellitus is different, there is not one meal plan that works for everyone. It is very important that you meet with a dietitian who will help you create a meal plan that is just right for you. Document Released: 08/06/2005 Document Revised: 11/14/2013 Document Reviewed: 10/06/2013 [employer] Patient Information 2015 [employer]. This information is not intended to replace advice given to you by your health care provider. Make sure you discuss any questions   you have with your health care provider. Diabetes and Exercise Exercising regularly is important. It is not just about losing weight. It has many health benefits, such as:  Improving your overall fitness, flexibility, and endurance.  Increasing your bone density.  Helping with weight control.  Decreasing your body fat.  Increasing your muscle strength.  Reducing stress and tension.  Improving your overall health. People with diabetes who exercise gain additional benefits because exercise:  Reduces appetite.  Improves the body's use of blood sugar (glucose).  Helps lower or control blood glucose.  Decreases blood pressure.  Helps control blood lipids (such as cholesterol and triglycerides).  Improves the body's use of the hormone insulin by:  Increasing the body's insulin sensitivity.  Reducing the body's insulin needs.  Decreases the risk for heart disease because exercising:  Lowers cholesterol and triglycerides levels.  Increases the levels of good cholesterol (such as high-density lipoproteins [HDL]) in the body.  Lowers blood glucose  levels. YOUR ACTIVITY PLAN  Choose an activity that you enjoy and set realistic goals. Your health care provider or diabetes educator can help you make an activity plan that works for you. Exercise regularly as directed by your health care provider. This includes:  Performing resistance training twice a week such as push-ups, sit-ups, lifting weights, or using resistance bands.  Performing 150 minutes of cardio exercises each week such as walking, running, or playing sports.  Staying active and spending no more than 90 minutes at one time being inactive. Even short bursts of exercise are good for you. Three 10-minute sessions spread throughout the day are just as beneficial as a single 30-minute session. Some exercise ideas include:  Taking the dog for a walk.  Taking the stairs instead of the elevator.  Dancing to your favorite song.  Doing an exercise video.  Doing your favorite exercise with a friend. RECOMMENDATIONS FOR EXERCISING WITH TYPE 1 OR TYPE 2 DIABETES   Check your blood glucose before exercising. If blood glucose levels are greater than 240 mg/dL, check for urine ketones. Do not exercise if ketones are present.  Avoid injecting insulin into areas of the body that are going to be exercised. For example, avoid injecting insulin into:  The arms when playing tennis.  The legs when jogging.  Keep a record of:  Food intake before and after you exercise.  Expected peak times of insulin action.  Blood glucose levels before and after you exercise.  The type and amount of exercise you have done.  Review your records with your health care provider. Your health care provider will help you to develop guidelines for adjusting food intake and insulin amounts before and after exercising.  If you take insulin or oral hypoglycemic agents, watch for signs and symptoms of hypoglycemia. They include:  Dizziness.  Shaking.  Sweating.  Chills.  Confusion.  Drink plenty of  water while you exercise to prevent dehydration or heat stroke. Body water is lost during exercise and must be replaced.  Talk to your health care provider before starting an exercise program to make sure it is safe for you. Remember, almost any type of activity is better than none. Document Released: 01/30/2004 Document Revised: 03/26/2014 Document Reviewed: 04/18/2013 [facility] Patient Information 2015 [place], [place]. This information is not intended to replace advice given to you by your health care provider. Make sure you discuss any questions you have with your health care provider. Basic Carbohydrate Counting for Diabetes Mellitus Carbohydrate counting is a method for keeping  track of the amount of carbohydrates you eat. Eating carbohydrates naturally increases the level of sugar (glucose) in your blood, so it is important for you to know the amount that is okay for you to have in every meal. Carbohydrate counting helps keep the level of glucose in your blood within normal limits. The amount of carbohydrates allowed is different for every person. A dietitian can help you calculate the amount that is right for you. Once you know the amount of carbohydrates you can have, you can count the carbohydrates in the foods you want to eat. Carbohydrates are found in the following foods:  Grains, such as breads and cereals.  Dried beans and soy products.  Starchy vegetables, such as potatoes, peas, and corn.  Fruit and fruit juices.  Milk and yogurt.  Sweets and snack foods, such as cake, cookies, candy, chips, soft drinks, and fruit drinks. CARBOHYDRATE COUNTING There are two ways to count the carbohydrates in your food. You can use either of the methods or a combination of both. Reading the "Nutrition Facts" on [place] The "Nutrition Facts" is an area that is included on the labels of almost all packaged food and beverages in the [place]. It includes the serving size of that food or  beverage and information about the nutrients in each serving of the food, including the grams (g) of carbohydrate per serving.  Decide the number of servings of this food or beverage that you will be able to eat or drink. Multiply that number of servings by the number of grams of carbohydrate that is listed on the label for that serving. The total will be the amount of carbohydrates you will be having when you eat or drink this food or beverage. Learning Standard Serving Sizes of Food When you eat food that is not packaged or does not include "Nutrition Facts" on the label, you need to measure the servings in order to count the amount of carbohydrates.A serving of most carbohydrate-rich foods contains about 15 g of carbohydrates. The following list includes serving sizes of carbohydrate-rich foods that provide 15 g ofcarbohydrate per serving:   1 slice of bread (1 oz) or 1 six-inch tortilla.    of a hamburger bun or English muffin.  4-6 crackers.   cup unsweetened dry cereal.    cup hot cereal.   cup rice or pasta.    cup mashed potatoes or  of a large baked potato.  1 cup fresh fruit or one small piece of fruit.    cup canned or frozen fruit or fruit juice.  1 cup milk.   cup plain fat-free yogurt or yogurt sweetened with artificial sweeteners.   cup cooked dried beans or starchy vegetable, such as peas, corn, or potatoes.  Decide the number of standard-size servings that you will eat. Multiply that number of servings by 15 (the grams of carbohydrates in that serving). For example, if you eat 2 cups of strawberries, you will have eaten 2 servings and 30 g of carbohydrates (2 servings x 15 g = 30 g). For foods such as soups and casseroles, in which more than one food is mixed in, you will need to count the carbohydrates in each food that is included. EXAMPLE OF CARBOHYDRATE COUNTING Sample Dinner  3 oz chicken breast.   cup of brown rice.   cup of corn.  1 cup  milk.   1 cup strawberries with sugar-free whipped topping.  Carbohydrate Calculation Step 1: Identify the foods that  contain carbohydrates:   Rice.   Corn.   Milk.   Strawberries. Step 2:Calculate the number of servings eaten of each:   2 servings of rice.   1 serving of corn.   1 serving of milk.   1 serving of strawberries. Step 3: Multiply each of those number of servings by 15 g:   2 servings of rice x 15 g = 30 g.   1 serving of corn x 15 g = 15 g.   1 serving of milk x 15 g = 15 g.   1 serving of strawberries x 15 g = 15 g. Step 4: Add together all of the amounts to find the total grams of carbohydrates eaten: 30 g + 15 g + 15 g + 15 g = 75 g. Document Released: 11/09/2005 Document Revised: 03/26/2014 Document Reviewed: 10/06/2013 [facility] Patient Information 2015 [place], [place]. This information is not intended to replace advice given to you by your health care provider. Make sure you discuss any questions you have with your health care provider.

## 2015-01-22 ENCOUNTER — Telehealth: Payer: Self-pay | Admitting: Internal Medicine

## 2015-01-22 NOTE — Telephone Encounter (Signed)
Patient called stating that she has been having high blood sugars and her medication is not working, she would like to speak to nurse. Please f/u with pt.

## 2015-01-25 ENCOUNTER — Ambulatory Visit: Payer: Medicaid Other | Attending: Internal Medicine | Admitting: *Deleted

## 2015-01-25 VITALS — BP 156/76 | HR 69 | Temp 98.1°F | Resp 16

## 2015-01-25 DIAGNOSIS — E1165 Type 2 diabetes mellitus with hyperglycemia: Secondary | ICD-10-CM | POA: Diagnosis not present

## 2015-01-25 DIAGNOSIS — E139 Other specified diabetes mellitus without complications: Secondary | ICD-10-CM | POA: Insufficient documentation

## 2015-01-25 LAB — GLUCOSE, POCT (MANUAL RESULT ENTRY)
POC Glucose: 298 mg/dl — AB (ref 70–99)
POC Glucose: 307 mg/dl — AB (ref 70–99)

## 2015-01-25 MED ORDER — INSULIN GLARGINE 100 UNIT/ML SOLOSTAR PEN: 25.0000 [IU] | PEN_INJECTOR | Freq: Every day | SUBCUTANEOUS | Status: DC

## 2015-01-25 MED ORDER — INSULIN ASPART 100 UNIT/ML ~~LOC~~ SOLN
10.0000 [IU] | Freq: Once | SUBCUTANEOUS | Status: AC
  Administered 2015-01-25: 10 [IU] via SUBCUTANEOUS

## 2015-01-25 NOTE — Progress Notes (Signed)
Patient presents for BP check and CBG and record review after increasing lantus to 17 units q HS Med list reviewed; patient reports taking all meds as directed Patient's AM fasting blood sugars ranging 151-320 Patient's before bed blood sugars ranging 204-441 Walking 30 minutes occasionally. Will work up to daily as weather improves Smoking 10 cigs/day down from 15 at last visit  CBG 298 10 units novolog insulin given SQ per protocol  BP 156/76  left arm manually with adult cuff P 69 R 16 T  98.1 oral SpO2 95%  Per medical Director: Increase lantus to 25 units q HS Return in 1 week for nurse visit for CBG and record review   Patient advised to call for med refills at least 7 days before running out so as not to go without.  CBG 307 30 minutes after insulin admin. Patient discharged to home in stable condition.

## 2015-02-18 ENCOUNTER — Ambulatory Visit: Payer: Medicaid Other | Admitting: Internal Medicine

## 2015-02-26 ENCOUNTER — Other Ambulatory Visit: Payer: Self-pay | Admitting: Internal Medicine

## 2015-03-06 ENCOUNTER — Encounter: Payer: Self-pay | Admitting: Internal Medicine

## 2015-03-06 ENCOUNTER — Ambulatory Visit: Payer: Medicaid Other | Attending: Internal Medicine | Admitting: Internal Medicine

## 2015-03-06 VITALS — BP 155/91 | HR 69 | Temp 98.0°F | Resp 16 | Wt 173.0 lb

## 2015-03-06 DIAGNOSIS — Z8673 Personal history of transient ischemic attack (TIA), and cerebral infarction without residual deficits: Secondary | ICD-10-CM | POA: Diagnosis not present

## 2015-03-06 DIAGNOSIS — I429 Cardiomyopathy, unspecified: Secondary | ICD-10-CM | POA: Insufficient documentation

## 2015-03-06 DIAGNOSIS — I10 Essential (primary) hypertension: Secondary | ICD-10-CM | POA: Diagnosis not present

## 2015-03-06 DIAGNOSIS — I5022 Chronic systolic (congestive) heart failure: Secondary | ICD-10-CM | POA: Diagnosis not present

## 2015-03-06 DIAGNOSIS — F17210 Nicotine dependence, cigarettes, uncomplicated: Secondary | ICD-10-CM | POA: Insufficient documentation

## 2015-03-06 DIAGNOSIS — Z7982 Long term (current) use of aspirin: Secondary | ICD-10-CM | POA: Diagnosis not present

## 2015-03-06 DIAGNOSIS — E139 Other specified diabetes mellitus without complications: Secondary | ICD-10-CM

## 2015-03-06 DIAGNOSIS — Z794 Long term (current) use of insulin: Secondary | ICD-10-CM | POA: Insufficient documentation

## 2015-03-06 DIAGNOSIS — E1165 Type 2 diabetes mellitus with hyperglycemia: Secondary | ICD-10-CM | POA: Diagnosis not present

## 2015-03-06 LAB — GLUCOSE, POCT (MANUAL RESULT ENTRY)
POC Glucose: 360 mg/dl — AB (ref 70–99)
POC Glucose: 346 mg/dl — AB (ref 70–99)

## 2015-03-06 MED ORDER — INSULIN ASPART 100 UNIT/ML ~~LOC~~ SOLN
20.0000 [IU] | Freq: Once | SUBCUTANEOUS | Status: AC
  Administered 2015-03-06: 20 [IU] via SUBCUTANEOUS

## 2015-03-06 MED ORDER — INSULIN LISPRO 100 UNIT/ML (KWIKPEN): 5.0000 [IU] | PEN_INJECTOR | Freq: Three times a day (TID) | SUBCUTANEOUS | Status: DC

## 2015-03-06 NOTE — Progress Notes (Signed)
MRN: [id number] Name: Alicia [name]  Sex: female Age: 59 y.o. DOB: 25-May-1956  Allergies: Diona Fanti and Other  Chief Complaint  Patient presents with  . Follow-up    HPI: Patient is 59 y.o. female who history of hypertension, diabetes, CHF, being followed up by her cardiologist, as per patient she has not taken her blood pressure medication, her major concern today is her blood sugar has been uncontrolled, she has been seen by nurse a few times and her Lantus dose has been increased currently taking 25 units each bedtime, patient has already eaten today and her blood sugar was elevated, she forgot to bring the fingerstick log as per patient still her fasting sugar is between 200-300 range, patient currently denies any acute symptoms.  Past Medical History  Diagnosis Date  . Diabetes mellitus, type II   . Hypertension   . H/O: CVA (cerebrovascular accident)     6 strokes, most recent on 05/10/13  . Chronic systolic dysfunction of left ventricle   . NICM (nonischemic cardiomyopathy)     Echo (05/2014):  Mod LVH, EF 20-25%, inf HK, mild MR, mild LAE.  . Tobacco abuse   . Noncompliance     Past Surgical History  Procedure Laterality Date  . Tubal ligation    . Cardiac catheterization    . Laparotomy N/A 01/15/2014    Procedure: EXPLORATORY LAPAROTOMY SMALL BOWEL RESECTION;  Surgeon: Imogene Burn. Georgette Dover, MD;  Location: [place];  Service: General;  Laterality: N/A;  . Lysis of adhesion N/A 01/15/2014    Procedure: LYSIS OF ADHESION;  Surgeon: Imogene Burn. Georgette Dover, MD;  Location: [place];  Service: General;  Laterality: N/A;  . Left heart catheterization with coronary angiogram N/A 02/19/2014    Procedure: LEFT HEART CATHETERIZATION WITH CORONARY ANGIOGRAM;  Surgeon: Troy Sine, MD;  Location: [facility] CATH LAB;  Service: Cardiovascular;  Laterality: N/A;      Medication List       This list is accurate as of: 03/06/15 11:36 AM.  Always use your most recent med list.               amLODipine  10 MG tablet  Commonly known as:  NORVASC  Take 1 tablet (10 mg total) by mouth daily.     aspirin 81 MG tablet  Take 81 mg by mouth daily.     atorvastatin 40 MG tablet  Commonly known as:  LIPITOR  Take 1 tablet (40 mg total) by mouth daily.     carvedilol 12.5 MG tablet  Commonly known as:  COREG  Take 1 tablet (12.5 mg total) by mouth 2 (two) times daily.     furosemide 40 MG tablet  Commonly known as:  LASIX  Take 40 mg twice daily x 5 days then go back to 1.5 mg daily     glimepiride 4 MG tablet  Commonly known as:  AMARYL  Take 1 tablet (4 mg total) by mouth daily before breakfast.     Insulin Glargine 100 UNIT/ML Solostar Pen  Commonly known as:  LANTUS  Inject 25 Units into the skin daily at 10 pm.     insulin lispro 100 UNIT/ML KiwkPen  Commonly known as:  HUMALOG  Inject 0.05 mLs (5 Units total) into the skin 3 (three) times daily. Administer subQ within 15 minutes before or immediately after a meal     Insulin Pen Needle 32G X 4 MM Misc  Use 4x a day     lisinopril  20 MG tablet  Commonly known as:  PRINIVIL,ZESTRIL  TAKE 1 TABLET BY MOUTH ONCE DAILY     polyethylene glycol packet  Commonly known as:  MIRALAX / GLYCOLAX  Take 17 g by mouth daily as needed for mild constipation.     TRUEPLUS LANCETS 28G Misc  USE TO TEST BLOOD SUGARS AS DIRECTED BY PHYSICIAN     TRUETEST TEST test strip  Generic drug:  glucose blood  USE TO TEST BLOOD SUGARS AS DIRECTED BY PHYSICIAN        Meds ordered this encounter  Medications  . insulin aspart (novoLOG) injection 20 Units    Sig:   . insulin lispro (HUMALOG) 100 UNIT/ML KiwkPen    Sig: Inject 0.05 mLs (5 Units total) into the skin 3 (three) times daily. Administer subQ within 15 minutes before or immediately after a meal    Dispense:  15 mL    Refill:  3    Immunization History  Administered Date(s) Administered  . Influenza,inj,Quad PF,36+ Mos 10/16/2013, 10/09/2014  . Pneumococcal-Unspecified 02/21/2014     Family History  Problem Relation Age of Onset  . Diabetes Mellitus II Mother   . Hypertension Mother   . CVA Mother     History  Substance Use Topics  . Smoking status: Current Every Day Smoker -- 0.50 packs/day for 25 years    Types: Cigarettes  . Smokeless tobacco: Never Used     Comment: smoking 10 cigs/day down from 15 at last visit  . Alcohol Use: No    Review of Systems   As noted in HPI  Filed Vitals:   03/06/15 1053  BP: 155/91  Pulse: 69  Temp: 98 F (36.7 C)  Resp: 16    Physical Exam  Physical Exam  Constitutional: No distress.  Eyes: EOM are normal. Pupils are equal, round, and reactive to light.  Cardiovascular: Normal rate and regular rhythm.   Pulmonary/Chest: No respiratory distress. She has no wheezes. She has no rales.  Musculoskeletal: She exhibits no edema.    CBC    Component Value Date/Time   WBC 5.8 02/20/2014 0446   RBC 4.01 02/20/2014 0446   HGB 10.9* 02/20/2014 0446   HCT 32.6* 02/20/2014 0446   PLT 244 02/20/2014 0446   MCV 81.3 02/20/2014 0446   LYMPHSABS 1.2 01/14/2014 0917   MONOABS 0.9 01/14/2014 0917   EOSABS 0.0 01/14/2014 0917   BASOSABS 0.0 01/14/2014 0917    CMP     Component Value Date/Time   NA 134* 09/26/2014 1017   K 4.4 09/26/2014 1017   CL 98 09/26/2014 1017   CO2 25 09/26/2014 1017   GLUCOSE 173* 09/26/2014 1017   BUN 15 09/26/2014 1017   CREATININE 0.6 09/26/2014 1017   CREATININE 0.44* 02/27/2014 1149   CALCIUM 9.5 09/26/2014 1017   PROT 7.5 11/09/2014 0959   ALBUMIN 3.9 11/09/2014 0959   AST 16 11/09/2014 0959   ALT 12 11/09/2014 0959   ALKPHOS 67 11/09/2014 0959   BILITOT 0.6 11/09/2014 0959   GFRNONAA >89 02/27/2014 1149   GFRNONAA >90 02/20/2014 0446   GFRAA >89 02/27/2014 1149   GFRAA >90 02/20/2014 0446    Lab Results  Component Value Date/Time   CHOL 236* 11/09/2014 09:59 AM    Lab Results  Component Value Date/Time   HGBA1C 12 01/11/2015 10:16 AM   HGBA1C 7.6* 02/18/2014  12:50 PM    Lab Results  Component Value Date/Time   AST 16 11/09/2014 09:59 AM  Assessment and Plan  Other specified diabetes mellitus without complications - Plan:  Results for orders placed or performed in visit on 03/06/15  Glucose (CBG)  Result Value Ref Range   POC Glucose 360 (A) 70 - 99 mg/dl  Glucose (CBG)  Result Value Ref Range   POC Glucose 346 (A) 70 - 99 mg/dl   diabetes is uncontrolled, last hemoglobin A1c was 12% in February, I have advised patient for diabetes meal planning, she will continue with Lantus 25 units each bedtime, I have started patient on meal time insulin Humalog 5 units 3 times a day , she will keep the fingerstick log and bring back in 2 weeks for the last visit. Glucose (CBG), insulin aspart (novoLOG) injection 20 Units, insulin lispro (HUMALOG) 100 UNIT/ML KiwkPen  Essential hypertension Blood pressure today is elevated since she has not taken her blood pressure medication, reevaluate on the following visit, continue with current meds.   Return in about 3 months (around 06/05/2015) for diabetes, hypertension, CBG check in 2 weeks/Nurse Visit.   This note has been created with Surveyor, quantity. Any transcriptional errors are unintentional.    Lorayne Marek, MD

## 2015-03-06 NOTE — Progress Notes (Signed)
Patient here for follow up on her diabetes Patient feels like her medication is not controlling her blood sugars Presents in office with elevated blood sugar 20 units novolog given per protocol

## 2015-03-06 NOTE — Patient Instructions (Signed)
Diabetes Mellitus and Food It is important for you to manage your blood sugar (glucose) level. Your blood glucose level can be greatly affected by what you eat. Eating healthier foods in the appropriate amounts throughout the day at about the same time each day will help you control your blood glucose level. It can also help slow or prevent worsening of your diabetes mellitus. Healthy eating may even help you improve the level of your blood pressure and reach or maintain a healthy weight.  HOW CAN FOOD AFFECT ME? Carbohydrates Carbohydrates affect your blood glucose level more than any other type of food. Your dietitian will help you determine how many carbohydrates to eat at each meal and teach you how to count carbohydrates. Counting carbohydrates is important to keep your blood glucose at a healthy level, especially if you are using insulin or taking certain medicines for diabetes mellitus. Alcohol Alcohol can cause sudden decreases in blood glucose (hypoglycemia), especially if you use insulin or take certain medicines for diabetes mellitus. Hypoglycemia can be a life-threatening condition. Symptoms of hypoglycemia (sleepiness, dizziness, and disorientation) are similar to symptoms of having too much alcohol.  If your health care provider has given you approval to drink alcohol, do so in moderation and use the following guidelines:  Women should not have more than one drink per day, and men should not have more than two drinks per day. One drink is equal to:  12 oz of beer.  5 oz of wine.  1 oz of hard liquor.  Do not drink on an empty stomach.  Keep yourself hydrated. Have water, diet soda, or unsweetened iced tea.  Regular soda, juice, and other mixers might contain a lot of carbohydrates and should be counted. WHAT FOODS ARE NOT RECOMMENDED? As you make food choices, it is important to remember that all foods are not the same. Some foods have fewer nutrients per serving than other  foods, even though they might have the same number of calories or carbohydrates. It is difficult to get your body what it needs when you eat foods with fewer nutrients. Examples of foods that you should avoid that are high in calories and carbohydrates but low in nutrients include:  Trans fats (most processed foods list trans fats on the Nutrition Facts label).  Regular soda.  Juice.  Candy.  Sweets, such as cake, pie, doughnuts, and cookies.  Fried foods. WHAT FOODS CAN I EAT? Have nutrient-rich foods, which will nourish your body and keep you healthy. The food you should eat also will depend on several factors, including:  The calories you need.  The medicines you take.  Your weight.  Your blood glucose level.  Your blood pressure level.  Your cholesterol level. You also should eat a variety of foods, including:  Protein, such as meat, poultry, fish, tofu, nuts, and seeds (lean animal proteins are best).  Fruits.  Vegetables.  Dairy products, such as milk, cheese, and yogurt (low fat is best).  Breads, grains, pasta, cereal, rice, and beans.  Fats such as olive oil, trans fat-free margarine, canola oil, avocado, and olives. DOES EVERYONE WITH DIABETES MELLITUS HAVE THE SAME MEAL PLAN? Because every person with diabetes mellitus is different, there is not one meal plan that works for everyone. It is very important that you meet with a dietitian who will help you create a meal plan that is just right for you. Document Released: 08/06/2005 Document Revised: 11/14/2013 Document Reviewed: 10/06/2013 ExitCare Patient Information 2015 [employer]. This   information is not intended to replace advice given to you by your health care provider. Make sure you discuss any questions you have with your health care provider. DASH Eating Plan DASH stands for "Dietary Approaches to Stop Hypertension." The DASH eating plan is a healthy eating plan that has been shown to reduce high  blood pressure (hypertension). Additional health benefits may include reducing the risk of type 2 diabetes mellitus, heart disease, and stroke. The DASH eating plan may also help with weight loss. WHAT DO I NEED TO KNOW ABOUT THE DASH EATING PLAN? For the DASH eating plan, you will follow these general guidelines:  Choose foods with a percent daily value for sodium of less than 5% (as listed on the food label).  Use salt-free seasonings or herbs instead of table salt or sea salt.  Check with your health care provider or pharmacist before using salt substitutes.  Eat lower-sodium products, often labeled as "lower sodium" or "no salt added."  Eat fresh foods.  Eat more vegetables, fruits, and low-fat dairy products.  Choose whole grains. Look for the word "whole" as the first word in the ingredient list.  Choose fish and skinless chicken or turkey more often than red meat. Limit fish, poultry, and meat to 6 oz (170 g) each day.  Limit sweets, desserts, sugars, and sugary drinks.  Choose heart-healthy fats.  Limit cheese to 1 oz (28 g) per day.  Eat more home-cooked food and less restaurant, buffet, and fast food.  Limit fried foods.  Cook foods using methods other than frying.  Limit canned vegetables. If you do use them, rinse them well to decrease the sodium.  When eating at a restaurant, ask that your food be prepared with less salt, or no salt if possible. WHAT FOODS CAN I EAT? Seek help from a dietitian for individual calorie needs. Grains Whole grain or whole wheat bread. Brown rice. Whole grain or whole wheat pasta. Quinoa, bulgur, and whole grain cereals. Low-sodium cereals. Corn or whole wheat flour tortillas. Whole grain cornbread. Whole grain crackers. Low-sodium crackers. Vegetables Fresh or frozen vegetables (raw, steamed, roasted, or grilled). Low-sodium or reduced-sodium tomato and vegetable juices. Low-sodium or reduced-sodium tomato sauce and paste. Low-sodium  or reduced-sodium canned vegetables.  Fruits All fresh, canned (in natural juice), or frozen fruits. Meat and Other Protein Products Ground beef (85% or leaner), grass-fed beef, or beef trimmed of fat. Skinless chicken or turkey. Ground chicken or turkey. Pork trimmed of fat. All fish and seafood. Eggs. Dried beans, peas, or lentils. Unsalted nuts and seeds. Unsalted canned beans. Dairy Low-fat dairy products, such as skim or 1% milk, 2% or reduced-fat cheeses, low-fat ricotta or cottage cheese, or plain low-fat yogurt. Low-sodium or reduced-sodium cheeses. Fats and Oils Tub margarines without trans fats. Light or reduced-fat mayonnaise and salad dressings (reduced sodium). Avocado. Safflower, olive, or canola oils. Natural peanut or almond butter. Other Unsalted popcorn and pretzels. The items listed above may not be a complete list of recommended foods or beverages. Contact your dietitian for more options. WHAT FOODS ARE NOT RECOMMENDED? Grains White bread. White pasta. White rice. Refined cornbread. Bagels and croissants. Crackers that contain trans fat. Vegetables Creamed or fried vegetables. Vegetables in a cheese sauce. Regular canned vegetables. Regular canned tomato sauce and paste. Regular tomato and vegetable juices. Fruits Dried fruits. Canned fruit in light or heavy syrup. Fruit juice. Meat and Other Protein Products Fatty cuts of meat. Ribs, chicken wings, bacon, sausage, bologna, salami, chitterlings, fatback, hot   dogs, bratwurst, and packaged luncheon meats. Salted nuts and seeds. Canned beans with salt. Dairy Whole or 2% milk, cream, half-and-half, and cream cheese. Whole-fat or sweetened yogurt. Full-fat cheeses or blue cheese. Nondairy creamers and whipped toppings. Processed cheese, cheese spreads, or cheese curds. Condiments Onion and garlic salt, seasoned salt, table salt, and sea salt. Canned and packaged gravies. Worcestershire sauce. Tartar sauce. Barbecue sauce.  Teriyaki sauce. Soy sauce, including reduced sodium. Steak sauce. Fish sauce. Oyster sauce. Cocktail sauce. Horseradish. Ketchup and mustard. Meat flavorings and tenderizers. Bouillon cubes. Hot sauce. Tabasco sauce. Marinades. Taco seasonings. Relishes. Fats and Oils Butter, stick margarine, lard, shortening, ghee, and bacon fat. Coconut, palm kernel, or palm oils. Regular salad dressings. Other Pickles and olives. Salted popcorn and pretzels. The items listed above may not be a complete list of foods and beverages to avoid. Contact your dietitian for more information. WHERE CAN I FIND MORE INFORMATION? National Heart, Lung, and [employer] Institute: www.nhlbi.nih.gov/health/health-topics/topics/dash/ Document Released: 10/29/2011 Document Revised: 03/26/2014 Document Reviewed: 09/13/2013 ExitCare Patient Information 2015 [employer]. This information is not intended to replace advice given to you by your health care provider. Make sure you discuss any questions you have with your health care provider.  

## 2015-03-11 ENCOUNTER — Ambulatory Visit: Payer: Medicaid Other | Admitting: Cardiology

## 2015-03-22 ENCOUNTER — Ambulatory Visit: Payer: Medicaid Other | Attending: Internal Medicine | Admitting: *Deleted

## 2015-03-22 VITALS — BP 132/66 | HR 64 | Temp 97.9°F | Resp 18

## 2015-03-22 DIAGNOSIS — Z794 Long term (current) use of insulin: Secondary | ICD-10-CM | POA: Diagnosis not present

## 2015-03-22 DIAGNOSIS — E1165 Type 2 diabetes mellitus with hyperglycemia: Secondary | ICD-10-CM | POA: Diagnosis not present

## 2015-03-22 LAB — GLUCOSE, POCT (MANUAL RESULT ENTRY): POC Glucose: 275 mg/dL — AB (ref 70–99)

## 2015-03-22 NOTE — Progress Notes (Signed)
Patient presents for CBG and record review for T2DM, however, patient did not bring log or meter Med list reviewed; patient reports taking lantus 10 units q HS and humalog 10 units tid AC as well as amaryl 4 mg daily as directed Taking coreg 12.5 mg once daily instead of bid as directed Patient has been checking BS only in AM due to being low on strips. Reeducated on importance of knowing BS prior to injecting humalog insulin due to potential for hypoglycemia S/sx of hypoglycemia and immediate actions to take discussed in detail Patient reports AM fasting blood sugars ranging 300-500 after 2 cups coffee with cream and sugar Reeducated on importance of checking AM fasting (prior to eating or drinking coffee) Patient states understanding Denies increased thirst and urination States a desire to lose 13 lbs (weight at 11/09/14 OV)  States BS 423 this AM after 2 cups coffee with cream and sugar. Patient took 10 units humalog insulin  CBG 275 2 hours after patient injected 10 units humalog insulin No additional novolog given per PCP   Lab Results  Component Value Date   HGBA1C 12 01/11/2015   Filed Vitals:   03/22/15 1059  BP: 132/66  Pulse: 64  Temp: 97.9 F (36.6 C)  Resp: 18     Per PCP: Take humalog 5 units tid AC and lantus 25 units q HS as previously directed Talk with cardiologist regarding coreg once daily instead of bid as directed (Patient has appt with cardiologist on 03/27/15)  Patient advised to call for med refills at least 7 days before running out so as not to go without.  Patient to return in 2 weeks for nurse visit for BP check, CBG and record review  Patient given literature on The Plate Method

## 2015-03-27 ENCOUNTER — Ambulatory Visit (INDEPENDENT_AMBULATORY_CARE_PROVIDER_SITE_OTHER): Payer: Medicaid Other | Admitting: Cardiology

## 2015-03-27 ENCOUNTER — Encounter: Payer: Self-pay | Admitting: Cardiology

## 2015-03-27 VITALS — BP 129/72 | HR 80 | Ht 66.0 in | Wt 172.0 lb

## 2015-03-27 DIAGNOSIS — E785 Hyperlipidemia, unspecified: Secondary | ICD-10-CM

## 2015-03-27 DIAGNOSIS — I5022 Chronic systolic (congestive) heart failure: Secondary | ICD-10-CM

## 2015-03-27 DIAGNOSIS — I2510 Atherosclerotic heart disease of native coronary artery without angina pectoris: Secondary | ICD-10-CM

## 2015-03-27 DIAGNOSIS — Z79899 Other long term (current) drug therapy: Secondary | ICD-10-CM | POA: Diagnosis not present

## 2015-03-27 DIAGNOSIS — I2583 Coronary atherosclerosis due to lipid rich plaque: Principal | ICD-10-CM

## 2015-03-27 LAB — BASIC METABOLIC PANEL
BUN: 14 mg/dL (ref 6–23)
CO2: 31 mEq/L (ref 19–32)
Calcium: 9.5 mg/dL (ref 8.4–10.5)
Chloride: 95 mEq/L — ABNORMAL LOW (ref 96–112)
Creatinine, Ser: 0.7 mg/dL (ref 0.40–1.20)
GFR: 110.83 mL/min (ref >60.00)
Glucose, Bld: 236 mg/dL — ABNORMAL HIGH (ref 70–99)
Potassium: 3.6 mEq/L (ref 3.5–5.1)
SODIUM: 132 meq/L — AB (ref 135–145)

## 2015-03-27 MED ORDER — CARVEDILOL 12.5 MG PO TABS: 12.5000 mg | ORAL_TABLET | Freq: Two times a day (BID) | ORAL | Status: DC

## 2015-03-27 NOTE — Progress Notes (Signed)
[facility]. [address], [address], [place]  [address] Phone: [phone] Fax:  [phone]  Date:  03/27/2015   ID:  Aura Camps Mccombs, DOB Mar 28, 1956, MRN [id number]  PCP:  Lorayne Marek, MD   History of Present Illness: Alicia [name] is a 59 y.o. female with a history of chronic systolic heart failure ejection fraction ranging from 25-40%, nuclear stress test with no ischemia, diabetes, hypertension, prior stroke here for followup. She's not having any symptoms of heart failure however. She has mowed the grass. No syncope, no retention of fluid. No chest pain. He was noted previously that her Coreg was only being taken once a day. We have changed this.  Studies:  - LHC (02/19/14): Mid RCA 30-40, distal RCA 60, EF 25%.  - Echo (05/11/13): Moderate LVH, EF 30-40%.  - Nuclear (07/23/09 - in [place]): No ischemia.  - Holter (03/2012): NSR, occasional PVCs, no significant arrhythmia  Recent Labs:  02/18/2014: Pro B Natriuretic peptide (BNP) 2876.0*; TSH 1.032  02/20/2014: Hemoglobin 10.9*  03/27/2014: ALT 13; HDL Cholesterol by NMR 49.50; LDL (calc) 135*  04/02/2014: Creatinine 0.5; Potassium 3.8      Wt Readings from Last 3 Encounters:  03/27/15 172 lb (78.019 kg)  03/06/15 173 lb (78.472 kg)  12/12/14 166 lb 6.4 oz (75.479 kg)     Past Medical History  Diagnosis Date  . Diabetes mellitus, type II   . Hypertension   . H/O: CVA (cerebrovascular accident)     6 strokes, most recent on 05/10/13  . Chronic systolic dysfunction of left ventricle   . NICM (nonischemic cardiomyopathy)     Echo (05/2014):  Mod LVH, EF 20-25%, inf HK, mild MR, mild LAE.  . Tobacco abuse   . Noncompliance     Past Surgical History  Procedure Laterality Date  . Tubal ligation    . Cardiac catheterization    . Laparotomy N/A 01/15/2014    Procedure: EXPLORATORY LAPAROTOMY SMALL BOWEL RESECTION;  Surgeon: Imogene Burn. Georgette Dover, MD;  Location: [place];  Service: General;  Laterality: N/A;  . Lysis of  adhesion N/A 01/15/2014    Procedure: LYSIS OF ADHESION;  Surgeon: Imogene Burn. Georgette Dover, MD;  Location: [place];  Service: General;  Laterality: N/A;  . Left heart catheterization with coronary angiogram N/A 02/19/2014    Procedure: LEFT HEART CATHETERIZATION WITH CORONARY ANGIOGRAM;  Surgeon: Troy Sine, MD;  Location: [facility] CATH LAB;  Service: Cardiovascular;  Laterality: N/A;    Current Outpatient Prescriptions  Medication Sig Dispense Refill  . amLODipine (NORVASC) 10 MG tablet Take 1 tablet (10 mg total) by mouth daily. 30 tablet 11  . aspirin 81 MG tablet Take 81 mg by mouth daily.    Marland Kitchen atorvastatin (LIPITOR) 40 MG tablet Take 1 tablet (40 mg total) by mouth daily. 30 tablet 6  . carvedilol (COREG) 12.5 MG tablet Take 12.5 mg by mouth daily.    . furosemide (LASIX) 40 MG tablet Take 40 mg twice daily x 5 days then go back to 1.5 mg daily 45 tablet 6  . glimepiride (AMARYL) 4 MG tablet Take 1 tablet (4 mg total) by mouth daily before breakfast. 30 tablet 3  . Insulin Glargine (LANTUS) 100 UNIT/ML Solostar Pen Inject 25 Units into the skin daily at 10 pm. 15 mL 3  . insulin lispro (HUMALOG) 100 UNIT/ML KiwkPen Inject 0.05 mLs (5 Units total) into the skin 3 (three) times daily. Administer subQ within 15 minutes before  or immediately after a meal 15 mL 3  . Insulin Pen Needle 32G X 4 MM MISC Use 4x a day 200 each 5  . lisinopril (PRINIVIL,ZESTRIL) 20 MG tablet TAKE 1 TABLET BY MOUTH ONCE DAILY 30 tablet 3  . polyethylene glycol (MIRALAX / GLYCOLAX) packet Take 17 g by mouth daily as needed for mild constipation. 14 each 0  . TRUEPLUS LANCETS 28G MISC USE TO TEST BLOOD SUGARS AS DIRECTED BY PHYSICIAN 100 each 12  . TRUETEST TEST test strip USE TO TEST BLOOD SUGARS AS DIRECTED BY PHYSICIAN 100 each 12   No current facility-administered medications for this visit.    Allergies:    Allergies  Allergen Reactions  . Asa [Aspirin] Other (See Comments)    Told not to take med-per MD  . Other Other  (See Comments)    Told not to take any dairy products-per MD    Social History:  The patient  reports that she has been smoking Cigarettes.  She has a 12.5 pack-year smoking history. She has never used smokeless tobacco. She reports that she does not drink alcohol or use illicit drugs.   Family History  Problem Relation Age of Onset  . Diabetes Mellitus II Mother   . Hypertension Mother   . CVA Mother   . Heart failure Brother   . Heart attack Neg Hx   . Stroke Mother     ROS:  Please see the history of present illness.   No syncope, no orthopnea, no chest pain, no PND   All other systems reviewed and negative.   PHYSICAL EXAM: VS:  BP 129/72 mmHg  Pulse 80  Ht 5\' 6"  (1.676 m)  Wt 172 lb (78.019 kg)  BMI 27.77 kg/m2 Well nourished, well developed, in no acute distress HEENT: normal, [place]/AT, EOMI Neck: no JVD, normal carotid upstroke, no bruit Cardiac:  normal S1, S2; RRR; no murmur Lungs:  clear to auscultation bilaterally, no wheezing, rhonchi or rales Abd: soft, nontender, no hepatomegaly, no bruitsAbdominal scar Ext: no edema, difficult distal pulses Skin: warm and dry GU: deferred Neuro: no focal abnormalities noted, AAO x 3  EKG:  None today     ASSESSMENT AND PLAN:  1. Chronic systolic heart failure-nonischemic. Her blood pressure is well controlled today. She is taking her medications. Reviewed as above. Beta blocker, ACE inhibitor. I would like for her to take her carvedilol twice a day. We will make a change. 2. Hypertension-controlled today. Continue to encourage medication compliance. I will check a basic metabolic profile. 3. Hyperlipidemia-atorvastatin 40. We will check lipid panel. 4. Coronary artery disease-nonobstructive disease. Continue with secondary prevention. Statin. 5. Prior ventral hernia repair- strangulation. 2/15 6. 6 month followup  7. Appreciate Dr. Rayann Heman consultation. They discussed ICD, no ICD.  Signed, Candee Furbish, MD [facility]  03/27/2015  11:26 AM

## 2015-03-27 NOTE — Patient Instructions (Signed)
Medication Instructions:  Please increase Carvedilol to 12.5 mg twice a day. Continue all other medications as listed.  Labwork: Please have blood work today (BMP,Lipid)  Testing/Procedures: None  Follow-Up: Follow up in 6 months with Dr. Marlou Porch.  You will receive a letter in the mail 2 months before you are due.  Please call us when you receive this letter to schedule your follow up appointment.  Thank you for choosing [facility]!!

## 2015-04-08 ENCOUNTER — Ambulatory Visit: Payer: Medicaid Other | Attending: Internal Medicine | Admitting: *Deleted

## 2015-04-08 VITALS — BP 135/69 | HR 60 | Temp 97.7°F | Resp 16 | Wt 174.4 lb

## 2015-04-08 DIAGNOSIS — I10 Essential (primary) hypertension: Secondary | ICD-10-CM | POA: Diagnosis not present

## 2015-04-08 DIAGNOSIS — Z794 Long term (current) use of insulin: Secondary | ICD-10-CM | POA: Diagnosis not present

## 2015-04-08 DIAGNOSIS — E1165 Type 2 diabetes mellitus with hyperglycemia: Secondary | ICD-10-CM | POA: Diagnosis present

## 2015-04-08 DIAGNOSIS — E139 Other specified diabetes mellitus without complications: Secondary | ICD-10-CM

## 2015-04-08 LAB — GLUCOSE, POCT (MANUAL RESULT ENTRY): POC Glucose: 220 mg/dl — AB (ref 70–99)

## 2015-04-08 MED ORDER — INSULIN LISPRO 100 UNIT/ML (KWIKPEN): 10.0000 [IU] | PEN_INJECTOR | Freq: Three times a day (TID) | SUBCUTANEOUS | Status: DC

## 2015-04-08 MED ORDER — INSULIN GLARGINE 100 UNIT/ML SOLOSTAR PEN: 28.0000 [IU] | PEN_INJECTOR | Freq: Every day | SUBCUTANEOUS | Status: DC

## 2015-04-08 MED ORDER — GLIMEPIRIDE 4 MG PO TABS: 4.0000 mg | ORAL_TABLET | Freq: Every day | ORAL | Status: DC

## 2015-04-08 NOTE — Progress Notes (Signed)
Patient presents for BP check, CBG and record review for T2DM Med list reviewed; patient reports taking all meds as directed except taking Humalog 10 units tid AC Taking amaryl 4 mg q AM, lantus 25 units q 10 pm Patient's AM fasting blood sugars ranging 125-251 Patient's before lunch blood sugars ranging 145-303 Patient's before dinner blood sugars ranging 160-375 Denies increased thirst Denies LE edema C/o urinary incontinence X 2 episodes last week when BS was elevated Patient c/o increased stress; another death in family and son getting married in 5 days  CBG 220 after "handful of chips"  Lab Results  Component Value Date   HGBA1C 12 01/11/2015   Filed Vitals:   04/08/15 0959  BP: 135/69  Pulse: 60  Temp: 97.7 F (36.5 C)  Resp: 16   Wt 174.4 lb   Per PCP: Increase lantus to 28 units q 10 pm Keep humalog at 10 units tid [facility]  Patient advised to call for med refills at least 7 days before running out so as not to go without.  Patient to return in 2 weeks for nurse visit for BP check, CBG and record review

## 2015-04-09 ENCOUNTER — Encounter: Payer: Self-pay | Admitting: *Deleted

## 2015-04-29 ENCOUNTER — Ambulatory Visit: Payer: Medicaid Other | Attending: Internal Medicine | Admitting: *Deleted

## 2015-04-29 VITALS — BP 124/67 | HR 61 | Temp 97.9°F | Resp 18 | Ht 66.5 in | Wt 173.8 lb

## 2015-04-29 DIAGNOSIS — Z794 Long term (current) use of insulin: Secondary | ICD-10-CM | POA: Diagnosis not present

## 2015-04-29 DIAGNOSIS — E139 Other specified diabetes mellitus without complications: Secondary | ICD-10-CM

## 2015-04-29 DIAGNOSIS — E119 Type 2 diabetes mellitus without complications: Secondary | ICD-10-CM | POA: Diagnosis not present

## 2015-04-29 DIAGNOSIS — I10 Essential (primary) hypertension: Secondary | ICD-10-CM

## 2015-04-29 DIAGNOSIS — E1165 Type 2 diabetes mellitus with hyperglycemia: Secondary | ICD-10-CM

## 2015-04-29 LAB — GLUCOSE, POCT (MANUAL RESULT ENTRY): POC Glucose: 219 mg/dl — AB (ref 70–99)

## 2015-04-29 LAB — POCT GLYCOSYLATED HEMOGLOBIN (HGB A1C): Hemoglobin A1C: 12.8

## 2015-04-29 MED ORDER — INSULIN GLARGINE 100 UNIT/ML SOLOSTAR PEN: 30.0000 [IU] | PEN_INJECTOR | Freq: Every day | SUBCUTANEOUS | Status: DC

## 2015-04-29 MED ORDER — INSULIN LISPRO 100 UNIT/ML (KWIKPEN): 15.0000 [IU] | PEN_INJECTOR | Freq: Three times a day (TID) | SUBCUTANEOUS | Status: DC

## 2015-04-29 NOTE — Progress Notes (Signed)
Patient presents for CBG and record review for T2DM Med list reviewed; patient reports taking all meds as directed except taking humalog insulin 15 units tid AC. Also taking lantus 28 units q HS and amaryl 4 mg with breakfast Patient's AM fasting blood sugars ranging 116-270 Patient's before lunch blood sugars ranging 116-205 Patient's before dinner blood sugars ranging 120-219 C/o LLQ pain X 1 week; thinks it's gas Daily firm BM Will follow up with GI if pain continues Denies increased thirst and urination   CBG 237 AM fasting per patient record CBG 219 After 15 units of novolog insulin and coffee with cream and sugar  Lab Results  Component Value Date   HGBA1C 12 01/11/2015   HGB A1C 12.8  Filed Vitals:   04/29/15 0923  BP: 124/67  Pulse: 61  Temp: 97.9 F (36.6 C)  Resp: 18     Per PCP: Keep humalog insulin at 15 units tid AC Increase lantus to 30 units q HS Follow up with GI if LLQ pain continues   Patient to return in 4 weeks for nurse visit for BP check, CBG and record review

## 2015-05-28 ENCOUNTER — Other Ambulatory Visit: Payer: Self-pay | Admitting: Physician Assistant

## 2015-05-29 ENCOUNTER — Ambulatory Visit: Payer: Medicaid Other | Attending: Internal Medicine | Admitting: *Deleted

## 2015-05-29 VITALS — BP 154/78 | HR 63 | Temp 97.7°F | Resp 16 | Ht 66.5 in | Wt 175.2 lb

## 2015-05-29 DIAGNOSIS — E119 Type 2 diabetes mellitus without complications: Secondary | ICD-10-CM | POA: Insufficient documentation

## 2015-05-29 DIAGNOSIS — I10 Essential (primary) hypertension: Secondary | ICD-10-CM

## 2015-05-29 DIAGNOSIS — F17200 Nicotine dependence, unspecified, uncomplicated: Secondary | ICD-10-CM | POA: Diagnosis not present

## 2015-05-29 DIAGNOSIS — E1165 Type 2 diabetes mellitus with hyperglycemia: Secondary | ICD-10-CM

## 2015-05-29 LAB — GLUCOSE, POCT (MANUAL RESULT ENTRY): POC GLUCOSE: 312 mg/dL — AB (ref 70–99)

## 2015-05-29 NOTE — Progress Notes (Signed)
Patient presents for BP check, CBG and record review for T2DM Med list reviewed; patient reports has not taken BP meds this AM due to rushing. Ran out of glimeperide 3 days ago and taking lantus 28 units vs 30 units ordered Took 15 units novolog at 0800 and had 2 cups of coffee with cream and 1 tsp sugar each cup and 1 slice cantaloupe Patient's AM fasting blood sugars ranging 95-230 Patient's before lunch blood sugars ranging 115-245 Patient's before dinner blood sugars ranging 116-275 (1 outlier at 335) Denies increased thirst and urination, blurry vision C/o being "tired all the time" Smoking 1 ppd; states she's trying to quit. Last cig 90 minutes ago  CBG 205 AM fasting per patient record CBG 312 after 2 cups of coffee with cream and 1 tsp sugar each cup and 1 slice cantaloupe  Lab Results  Component Value Date   HGBA1C 12.80 04/29/2015   Filed Vitals:   05/29/15 1103  BP: 154/78  Pulse: 63  Temp: 97.7 F (36.5 C)  Resp: 16    Per PCP: Do not give protocol novolog insulin as patient will be taking 15 units before lunch shortly Take 30 units of lantus q HS as previously directed Restart glimeperide as previously directed   Patient aware that she is due for 3 month  f/u with PCP. Instructed to schedule appt for 2 weeks

## 2015-06-10 ENCOUNTER — Other Ambulatory Visit: Payer: Self-pay | Admitting: Pharmacist

## 2015-06-10 MED ORDER — GLUCOSE BLOOD VI STRP: ORAL_STRIP | Status: DC

## 2015-06-17 ENCOUNTER — Ambulatory Visit: Payer: Medicaid Other | Attending: Internal Medicine | Admitting: Internal Medicine

## 2015-06-17 ENCOUNTER — Encounter: Payer: Self-pay | Admitting: Internal Medicine

## 2015-06-17 VITALS — BP 127/73 | HR 61 | Temp 98.0°F | Resp 16 | Wt 174.6 lb

## 2015-06-17 DIAGNOSIS — Z7982 Long term (current) use of aspirin: Secondary | ICD-10-CM | POA: Insufficient documentation

## 2015-06-17 DIAGNOSIS — F17210 Nicotine dependence, cigarettes, uncomplicated: Secondary | ICD-10-CM | POA: Insufficient documentation

## 2015-06-17 DIAGNOSIS — I509 Heart failure, unspecified: Secondary | ICD-10-CM | POA: Insufficient documentation

## 2015-06-17 DIAGNOSIS — E119 Type 2 diabetes mellitus without complications: Secondary | ICD-10-CM | POA: Diagnosis not present

## 2015-06-17 DIAGNOSIS — Z794 Long term (current) use of insulin: Secondary | ICD-10-CM | POA: Diagnosis not present

## 2015-06-17 DIAGNOSIS — Z79899 Other long term (current) drug therapy: Secondary | ICD-10-CM | POA: Insufficient documentation

## 2015-06-17 DIAGNOSIS — E139 Other specified diabetes mellitus without complications: Secondary | ICD-10-CM | POA: Diagnosis not present

## 2015-06-17 DIAGNOSIS — Z8673 Personal history of transient ischemic attack (TIA), and cerebral infarction without residual deficits: Secondary | ICD-10-CM | POA: Insufficient documentation

## 2015-06-17 DIAGNOSIS — I10 Essential (primary) hypertension: Secondary | ICD-10-CM | POA: Diagnosis not present

## 2015-06-17 LAB — GLUCOSE, POCT (MANUAL RESULT ENTRY): POC Glucose: 196 mg/dl — AB (ref 70–99)

## 2015-06-17 NOTE — Patient Instructions (Signed)
Diabetes Mellitus and Food It is important for you to manage your blood sugar (glucose) level. Your blood glucose level can be greatly affected by what you eat. Eating healthier foods in the appropriate amounts throughout the day at about the same time each day will help you control your blood glucose level. It can also help slow or prevent worsening of your diabetes mellitus. Healthy eating may even help you improve the level of your blood pressure and reach or maintain a healthy weight.  HOW CAN FOOD AFFECT ME? Carbohydrates Carbohydrates affect your blood glucose level more than any other type of food. Your dietitian will help you determine how many carbohydrates to eat at each meal and teach you how to count carbohydrates. Counting carbohydrates is important to keep your blood glucose at a healthy level, especially if you are using insulin or taking certain medicines for diabetes mellitus. Alcohol Alcohol can cause sudden decreases in blood glucose (hypoglycemia), especially if you use insulin or take certain medicines for diabetes mellitus. Hypoglycemia can be a life-threatening condition. Symptoms of hypoglycemia (sleepiness, dizziness, and disorientation) are similar to symptoms of having too much alcohol.  If your health care provider has given you approval to drink alcohol, do so in moderation and use the following guidelines:  Women should not have more than one drink per day, and men should not have more than two drinks per day. One drink is equal to:  12 oz of beer.  5 oz of wine.  1 oz of hard liquor.  Do not drink on an empty stomach.  Keep yourself hydrated. Have water, diet soda, or unsweetened iced tea.  Regular soda, juice, and other mixers might contain a lot of carbohydrates and should be counted. WHAT FOODS ARE NOT RECOMMENDED? As you make food choices, it is important to remember that all foods are not the same. Some foods have fewer nutrients per serving than other  foods, even though they might have the same number of calories or carbohydrates. It is difficult to get your body what it needs when you eat foods with fewer nutrients. Examples of foods that you should avoid that are high in calories and carbohydrates but low in nutrients include:  Trans fats (most processed foods list trans fats on the Nutrition Facts label).  Regular soda.  Juice.  Candy.  Sweets, such as cake, pie, doughnuts, and cookies.  Fried foods. WHAT FOODS CAN I EAT? Have nutrient-rich foods, which will nourish your body and keep you healthy. The food you should eat also will depend on several factors, including:  The calories you need.  The medicines you take.  Your weight.  Your blood glucose level.  Your blood pressure level.  Your cholesterol level. You also should eat a variety of foods, including:  Protein, such as meat, poultry, fish, tofu, nuts, and seeds (lean animal proteins are best).  Fruits.  Vegetables.  Dairy products, such as milk, cheese, and yogurt (low fat is best).  Breads, grains, pasta, cereal, rice, and beans.  Fats such as olive oil, trans fat-free margarine, canola oil, avocado, and olives. DOES EVERYONE WITH DIABETES MELLITUS HAVE THE SAME MEAL PLAN? Because every person with diabetes mellitus is different, there is not one meal plan that works for everyone. It is very important that you meet with a dietitian who will help you create a meal plan that is just right for you. Document Released: 08/06/2005 Document Revised: 11/14/2013 Document Reviewed: 10/06/2013 ExitCare Patient Information 2015 [employer]. This   information is not intended to replace advice given to you by your health care provider. Make sure you discuss any questions you have with your health care provider. DASH Eating Plan DASH stands for "Dietary Approaches to Stop Hypertension." The DASH eating plan is a healthy eating plan that has been shown to reduce high  blood pressure (hypertension). Additional health benefits may include reducing the risk of type 2 diabetes mellitus, heart disease, and stroke. The DASH eating plan may also help with weight loss. WHAT DO I NEED TO KNOW ABOUT THE DASH EATING PLAN? For the DASH eating plan, you will follow these general guidelines:  Choose foods with a percent daily value for sodium of less than 5% (as listed on the food label).  Use salt-free seasonings or herbs instead of table salt or sea salt.  Check with your health care provider or pharmacist before using salt substitutes.  Eat lower-sodium products, often labeled as "lower sodium" or "no salt added."  Eat fresh foods.  Eat more vegetables, fruits, and low-fat dairy products.  Choose whole grains. Look for the word "whole" as the first word in the ingredient list.  Choose fish and skinless chicken or turkey more often than red meat. Limit fish, poultry, and meat to 6 oz (170 g) each day.  Limit sweets, desserts, sugars, and sugary drinks.  Choose heart-healthy fats.  Limit cheese to 1 oz (28 g) per day.  Eat more home-cooked food and less restaurant, buffet, and fast food.  Limit fried foods.  Cook foods using methods other than frying.  Limit canned vegetables. If you do use them, rinse them well to decrease the sodium.  When eating at a restaurant, ask that your food be prepared with less salt, or no salt if possible. WHAT FOODS CAN I EAT? Seek help from a dietitian for individual calorie needs. Grains Whole grain or whole wheat bread. Brown rice. Whole grain or whole wheat pasta. Quinoa, bulgur, and whole grain cereals. Low-sodium cereals. Corn or whole wheat flour tortillas. Whole grain cornbread. Whole grain crackers. Low-sodium crackers. Vegetables Fresh or frozen vegetables (raw, steamed, roasted, or grilled). Low-sodium or reduced-sodium tomato and vegetable juices. Low-sodium or reduced-sodium tomato sauce and paste. Low-sodium  or reduced-sodium canned vegetables.  Fruits All fresh, canned (in natural juice), or frozen fruits. Meat and Other Protein Products Ground beef (85% or leaner), grass-fed beef, or beef trimmed of fat. Skinless chicken or turkey. Ground chicken or turkey. Pork trimmed of fat. All fish and seafood. Eggs. Dried beans, peas, or lentils. Unsalted nuts and seeds. Unsalted canned beans. Dairy Low-fat dairy products, such as skim or 1% milk, 2% or reduced-fat cheeses, low-fat ricotta or cottage cheese, or plain low-fat yogurt. Low-sodium or reduced-sodium cheeses. Fats and Oils Tub margarines without trans fats. Light or reduced-fat mayonnaise and salad dressings (reduced sodium). Avocado. Safflower, olive, or canola oils. Natural peanut or almond butter. Other Unsalted popcorn and pretzels. The items listed above may not be a complete list of recommended foods or beverages. Contact your dietitian for more options. WHAT FOODS ARE NOT RECOMMENDED? Grains White bread. White pasta. White rice. Refined cornbread. Bagels and croissants. Crackers that contain trans fat. Vegetables Creamed or fried vegetables. Vegetables in a cheese sauce. Regular canned vegetables. Regular canned tomato sauce and paste. Regular tomato and vegetable juices. Fruits Dried fruits. Canned fruit in light or heavy syrup. Fruit juice. Meat and Other Protein Products Fatty cuts of meat. Ribs, chicken wings, bacon, sausage, bologna, salami, chitterlings, fatback, hot   dogs, bratwurst, and packaged luncheon meats. Salted nuts and seeds. Canned beans with salt. Dairy Whole or 2% milk, cream, half-and-half, and cream cheese. Whole-fat or sweetened yogurt. Full-fat cheeses or blue cheese. Nondairy creamers and whipped toppings. Processed cheese, cheese spreads, or cheese curds. Condiments Onion and garlic salt, seasoned salt, table salt, and sea salt. Canned and packaged gravies. Worcestershire sauce. Tartar sauce. Barbecue sauce.  Teriyaki sauce. Soy sauce, including reduced sodium. Steak sauce. Fish sauce. Oyster sauce. Cocktail sauce. Horseradish. Ketchup and mustard. Meat flavorings and tenderizers. Bouillon cubes. Hot sauce. Tabasco sauce. Marinades. Taco seasonings. Relishes. Fats and Oils Butter, stick margarine, lard, shortening, ghee, and bacon fat. Coconut, palm kernel, or palm oils. Regular salad dressings. Other Pickles and olives. Salted popcorn and pretzels. The items listed above may not be a complete list of foods and beverages to avoid. Contact your dietitian for more information. WHERE CAN I FIND MORE INFORMATION? National Heart, Lung, and [employer] Institute: www.nhlbi.nih.gov/health/health-topics/topics/dash/ Document Released: 10/29/2011 Document Revised: 03/26/2014 Document Reviewed: 09/13/2013 ExitCare Patient Information 2015 [employer]. This information is not intended to replace advice given to you by your health care provider. Make sure you discuss any questions you have with your health care provider.  

## 2015-06-17 NOTE — Progress Notes (Signed)
Patient here for her routine follow up on her diabetes and hypertension No complaints at this time

## 2015-06-17 NOTE — Progress Notes (Signed)
MRN: [id number] Name: Alicia [name]  Sex: female Age: 59 y.o. DOB: September 18, 1956  Allergies: Other  Chief Complaint  Patient presents with  . Follow-up    HPI: Patient is 59 y.o. female who has history of diabetes, hypertension, CHF, comes today for followup, she denies any acute symptoms denies any headache dizziness chest and shortness of breath, she is compliant with taking her medications, she denies any hypoglycemic symptoms, her fasting blood sugars usually more than 1 50 mg/dL, as per patient she has been taking Lantus 28 units each bedtime, in the past she was advised to 30 units, she's also taking humalog 5 units with each meal. Patient is also following up with the cardiologist.  Past Medical History  Diagnosis Date  . Diabetes mellitus, type II   . Hypertension   . H/O: CVA (cerebrovascular accident)     6 strokes, most recent on 05/10/13  . Chronic systolic dysfunction of left ventricle   . NICM (nonischemic cardiomyopathy)     Echo (05/2014):  Mod LVH, EF 20-25%, inf HK, mild MR, mild LAE.  . Tobacco abuse   . Noncompliance     Past Surgical History  Procedure Laterality Date  . Tubal ligation    . Cardiac catheterization    . Laparotomy N/A 01/15/2014    Procedure: EXPLORATORY LAPAROTOMY SMALL BOWEL RESECTION;  Surgeon: Imogene Burn. Georgette Dover, MD;  Location: [facility];  Service: General;  Laterality: N/A;  . Lysis of adhesion N/A 01/15/2014    Procedure: LYSIS OF ADHESION;  Surgeon: Imogene Burn. Georgette Dover, MD;  Location: [facility];  Service: General;  Laterality: N/A;  . Left heart catheterization with coronary angiogram N/A 02/19/2014    Procedure: LEFT HEART CATHETERIZATION WITH CORONARY ANGIOGRAM;  Surgeon: Troy Sine, MD;  Location: [facility] CATH LAB;  Service: Cardiovascular;  Laterality: N/A;      Medication List       This list is accurate as of: 06/17/15 10:25 AM.  Always use your most recent med list.               amLODipine 10 MG tablet  Commonly known as:  NORVASC    Take 1 tablet (10 mg total) by mouth daily.     aspirin 81 MG tablet  Take 81 mg by mouth daily.     atorvastatin 40 MG tablet  Commonly known as:  LIPITOR  Take 1 tablet (40 mg total) by mouth daily.     carvedilol 12.5 MG tablet  Commonly known as:  COREG  Take 1 tablet (12.5 mg total) by mouth 2 (two) times daily.     furosemide 40 MG tablet  Commonly known as:  LASIX  Take 40 mg twice daily x 5 days then go back to 1.5 mg daily     glimepiride 4 MG tablet  Commonly known as:  AMARYL  Take 1 tablet (4 mg total) by mouth daily before breakfast.     glucose blood test strip  Commonly known as:  ACCU-CHEK AVIVA PLUS  USE TO TEST BLOOD SUGARS AS DIRECTED BY PHYSICIAN     Insulin Glargine 100 UNIT/ML Solostar Pen  Commonly known as:  LANTUS  Inject 30 Units into the skin daily at 10 pm.     insulin lispro 100 UNIT/ML KiwkPen  Commonly known as:  HUMALOG  Inject 0.15 mLs (15 Units total) into the skin 3 (three) times daily. Administer subQ within 15 minutes before or immediately after a meal  Insulin Pen Needle 32G X 4 MM Misc  Use 4x a day     lisinopril 20 MG tablet  Commonly known as:  PRINIVIL,ZESTRIL  TAKE 1 TABLET BY MOUTH ONCE DAILY     polyethylene glycol packet  Commonly known as:  MIRALAX / GLYCOLAX  Take 17 g by mouth daily as needed for mild constipation.     TRUEPLUS LANCETS 28G Misc  USE TO TEST BLOOD SUGARS AS DIRECTED BY PHYSICIAN        No orders of the defined types were placed in this encounter.    Immunization History  Administered Date(s) Administered  . Influenza,inj,Quad PF,36+ Mos 10/16/2013, 10/09/2014  . Pneumococcal-Unspecified 02/21/2014    Family History  Problem Relation Age of Onset  . Diabetes Mellitus II Mother   . Hypertension Mother   . CVA Mother   . Heart failure Brother   . Heart attack Neg Hx   . Stroke Mother     History  Substance Use Topics  . Smoking status: Current Every Day Smoker -- 0.50 packs/day  for 25 years    Types: Cigarettes  . Smokeless tobacco: Never Used     Comment: smoking 1 ppd  . Alcohol Use: No    Review of Systems   As noted in HPI  Filed Vitals:   06/17/15 0914  BP: 127/73  Pulse: 61  Temp: 98 F (36.7 C)  Resp: 16    Physical Exam  Physical Exam  HENT:  Head: Normocephalic and atraumatic.  Eyes: Conjunctivae are normal.  Neck: Neck supple.  Cardiovascular: Normal rate and regular rhythm.   Pulmonary/Chest: Breath sounds normal. No respiratory distress. She has no wheezes. She has no rales.  Musculoskeletal: She exhibits no edema.    CBC    Component Value Date/Time   WBC 5.8 02/20/2014 0446   RBC 4.01 02/20/2014 0446   HGB 10.9* 02/20/2014 0446   HCT 32.6* 02/20/2014 0446   PLT 244 02/20/2014 0446   MCV 81.3 02/20/2014 0446   LYMPHSABS 1.2 01/14/2014 0917   MONOABS 0.9 01/14/2014 0917   EOSABS 0.0 01/14/2014 0917   BASOSABS 0.0 01/14/2014 0917    CMP     Component Value Date/Time   NA 132* 03/27/2015 1146   K 3.6 03/27/2015 1146   CL 95* 03/27/2015 1146   CO2 31 03/27/2015 1146   GLUCOSE 236* 03/27/2015 1146   BUN 14 03/27/2015 1146   CREATININE 0.70 03/27/2015 1146   CREATININE 0.44* 02/27/2014 1149   CALCIUM 9.5 03/27/2015 1146   PROT 7.5 11/09/2014 0959   ALBUMIN 3.9 11/09/2014 0959   AST 16 11/09/2014 0959   ALT 12 11/09/2014 0959   ALKPHOS 67 11/09/2014 0959   BILITOT 0.6 11/09/2014 0959   GFRNONAA >89 02/27/2014 1149   GFRNONAA >90 02/20/2014 0446   GFRAA >89 02/27/2014 1149   GFRAA >90 02/20/2014 0446    Lab Results  Component Value Date/Time   CHOL 236* 11/09/2014 09:59 AM    Lab Results  Component Value Date/Time   HGBA1C 12.80 04/29/2015 09:34 AM   HGBA1C 7.6* 02/18/2014 12:50 PM    Lab Results  Component Value Date/Time   AST 16 11/09/2014 09:59 AM    Assessment and Plan  Other specified diabetes mellitus without complications - Plan:  Results for orders placed or performed in visit on  06/17/15  Glucose (CBG)  Result Value Ref Range   POC Glucose 196 (A) 70 - 99 mg/dl  Her blood sugar level is  trending down, she will start taking Lantus 30 units for now, she will uptitrate to 32 units if her fasting sugar is more than 1 50 mg/dL consistently for a week, she understand and verbalized instructions, advised patient for low carbohydrate diet, keep the fingerstick log, continue with Humalog 5 units 3 times with meals. We'll recheck A1c in 3 months.  Essential hypertension/CHF Blood pressure is well controlled, continue with current medications, continue to follow with cardiology   Return in about 3 months (around 09/17/2015), or if symptoms worsen or fail to improve, for diabetes, hypertension.   This note has been created with Surveyor, quantity. Any transcriptional errors are unintentional.    Lorayne Marek, MD

## 2015-07-10 ENCOUNTER — Other Ambulatory Visit: Payer: Self-pay | Admitting: Internal Medicine

## 2015-07-15 ENCOUNTER — Telehealth: Payer: Self-pay

## 2015-07-15 ENCOUNTER — Other Ambulatory Visit: Payer: Self-pay

## 2015-07-15 DIAGNOSIS — I5022 Chronic systolic (congestive) heart failure: Secondary | ICD-10-CM

## 2015-07-15 DIAGNOSIS — I10 Essential (primary) hypertension: Secondary | ICD-10-CM

## 2015-07-15 DIAGNOSIS — R42 Dizziness and giddiness: Secondary | ICD-10-CM

## 2015-07-15 DIAGNOSIS — M791 Myalgia, unspecified site: Secondary | ICD-10-CM

## 2015-07-15 DIAGNOSIS — I2510 Atherosclerotic heart disease of native coronary artery without angina pectoris: Secondary | ICD-10-CM

## 2015-07-15 MED ORDER — LISINOPRIL 20 MG PO TABS: 20.0000 mg | ORAL_TABLET | Freq: Every day | ORAL | Status: DC

## 2015-07-15 MED ORDER — AMLODIPINE BESYLATE 10 MG PO TABS: 10.0000 mg | ORAL_TABLET | Freq: Every day | ORAL | Status: DC

## 2015-07-15 MED ORDER — ATORVASTATIN CALCIUM 40 MG PO TABS: 40.0000 mg | ORAL_TABLET | Freq: Every day | ORAL | Status: DC

## 2015-07-15 NOTE — Telephone Encounter (Signed)
Patient called to request a med refill for Lisinopril, patient stated that her feet have become swollen. Patient would also like refills on other medications. Please f/u with pt.

## 2015-07-15 NOTE — Telephone Encounter (Signed)
Patient called requesting refills on her lisinopril  lipitor and amlodipine [facility]

## 2015-08-05 ENCOUNTER — Other Ambulatory Visit: Payer: Self-pay

## 2015-08-05 DIAGNOSIS — I10 Essential (primary) hypertension: Secondary | ICD-10-CM

## 2015-08-05 DIAGNOSIS — I5022 Chronic systolic (congestive) heart failure: Secondary | ICD-10-CM

## 2015-08-05 MED ORDER — FUROSEMIDE 40 MG PO TABS
60.0000 mg | ORAL_TABLET | Freq: Every day | ORAL | Status: DC
Start: 2015-08-05 — End: 2016-01-07

## 2015-08-05 MED ORDER — CARVEDILOL 12.5 MG PO TABS: 12.5000 mg | ORAL_TABLET | Freq: Two times a day (BID) | ORAL | Status: DC

## 2015-08-05 NOTE — Telephone Encounter (Signed)
[name], MD at 03/27/2015 11:26 AM  furosemide (LASIX) 40 MG tabletTake 40 mg twice daily x 5 days then go back to 1.5 mg daily carvedilol (COREG) 12.5 MG tabletTake 12.5 mg by mouth daily Patient Instructions     Medication Instructions:  Please increase Carvedilol to 12.5 mg twice a day. Continue all other medications as listed.

## 2015-08-14 ENCOUNTER — Ambulatory Visit: Payer: Medicaid Other | Attending: Family Medicine | Admitting: Internal Medicine

## 2015-08-14 ENCOUNTER — Encounter: Payer: Self-pay | Admitting: Family Medicine

## 2015-08-14 VITALS — BP 130/75 | HR 60 | Temp 98.2°F | Resp 16 | Ht 67.0 in | Wt 180.0 lb

## 2015-08-14 DIAGNOSIS — Z720 Tobacco use: Secondary | ICD-10-CM | POA: Diagnosis not present

## 2015-08-14 DIAGNOSIS — Z23 Encounter for immunization: Secondary | ICD-10-CM | POA: Diagnosis not present

## 2015-08-14 DIAGNOSIS — Z1239 Encounter for other screening for malignant neoplasm of breast: Secondary | ICD-10-CM

## 2015-08-14 DIAGNOSIS — Z794 Long term (current) use of insulin: Secondary | ICD-10-CM | POA: Diagnosis not present

## 2015-08-14 DIAGNOSIS — E119 Type 2 diabetes mellitus without complications: Secondary | ICD-10-CM | POA: Diagnosis not present

## 2015-08-14 DIAGNOSIS — E785 Hyperlipidemia, unspecified: Secondary | ICD-10-CM

## 2015-08-14 DIAGNOSIS — I10 Essential (primary) hypertension: Secondary | ICD-10-CM

## 2015-08-14 DIAGNOSIS — F17200 Nicotine dependence, unspecified, uncomplicated: Secondary | ICD-10-CM

## 2015-08-14 LAB — GLUCOSE, POCT (MANUAL RESULT ENTRY): POC GLUCOSE: 173 mg/dL — AB (ref 70–99)

## 2015-08-14 LAB — POCT GLYCOSYLATED HEMOGLOBIN (HGB A1C): Hemoglobin A1C: 13.1

## 2015-08-14 MED ORDER — GLIMEPIRIDE 4 MG PO TABS: 4.0000 mg | ORAL_TABLET | Freq: Every day | ORAL | Status: DC

## 2015-08-14 MED ORDER — INSULIN LISPRO 100 UNIT/ML (KWIKPEN): 15.0000 [IU] | PEN_INJECTOR | Freq: Three times a day (TID) | SUBCUTANEOUS | Status: DC

## 2015-08-14 MED ORDER — LISINOPRIL 20 MG PO TABS: 20.0000 mg | ORAL_TABLET | Freq: Every day | ORAL | Status: DC

## 2015-08-14 MED ORDER — INSULIN GLARGINE 100 UNIT/ML SOLOSTAR PEN: 32.0000 [IU] | PEN_INJECTOR | Freq: Every day | SUBCUTANEOUS | Status: DC

## 2015-08-14 MED ORDER — NICOTINE 21 MG/24HR TD PT24: 21.0000 mg | MEDICATED_PATCH | Freq: Every day | TRANSDERMAL | Status: DC

## 2015-08-14 NOTE — Progress Notes (Signed)
F/U DM  Complaining of sinus infection x1 wk With productive cough

## 2015-08-14 NOTE — Patient Instructions (Signed)
Smoking Cessation Quitting smoking is important to your health and has many advantages. However, it is not always easy to quit since nicotine is a very addictive drug. Oftentimes, people try 3 times or more before being able to quit. This document explains the best ways for you to prepare to quit smoking. Quitting takes hard work and a lot of effort, but you can do it. ADVANTAGES OF QUITTING SMOKING  You will live longer, feel better, and live better.  Your body will feel the impact of quitting smoking almost immediately.  Within 20 minutes, blood pressure decreases. Your pulse returns to its normal level.  After 8 hours, carbon monoxide levels in the blood return to normal. Your oxygen level increases.  After 24 hours, the chance of having a heart attack starts to decrease. Your breath, hair, and body stop smelling like smoke.  After 48 hours, damaged nerve endings begin to recover. Your sense of taste and smell improve.  After 72 hours, the body is virtually free of nicotine. Your bronchial tubes relax and breathing becomes easier.  After 2 to 12 weeks, lungs can hold more air. Exercise becomes easier and circulation improves.  The risk of having a heart attack, stroke, cancer, or lung disease is greatly reduced.  After 1 year, the risk of coronary heart disease is cut in half.  After 5 years, the risk of stroke falls to the same as a nonsmoker.  After 10 years, the risk of lung cancer is cut in half and the risk of other cancers decreases significantly.  After 15 years, the risk of coronary heart disease drops, usually to the level of a nonsmoker.  If you are pregnant, quitting smoking will improve your chances of having a healthy baby.  The people you live with, especially any children, will be healthier.  You will have extra money to spend on things other than cigarettes. QUESTIONS TO THINK ABOUT BEFORE ATTEMPTING TO QUIT You may want to talk about your answers with your  health care Cordarryl Monrreal.  Why do you want to quit?  If you tried to quit in the past, what helped and what did not?  What will be the most difficult situations for you after you quit? How will you plan to handle them?  Who can help you through the tough times? Your family? Friends? A health care Jacee Enerson?  What pleasures do you get from smoking? What ways can you still get pleasure if you quit? Here are some questions to ask your health care Tarsha Blando:  How can you help me to be successful at quitting?  What medicine do you think would be best for me and how should I take it?  What should I do if I need more help?  What is smoking withdrawal like? How can I get information on withdrawal? GET READY  Set a quit date.  Change your environment by getting rid of all cigarettes, ashtrays, matches, and lighters in your home, car, or work. Do not let people smoke in your home.  Review your past attempts to quit. Think about what worked and what did not. GET SUPPORT AND ENCOURAGEMENT You have a better chance of being successful if you have help. You can get support in many ways.  Tell your family, friends, and coworkers that you are going to quit and need their support. Ask them not to smoke around you.  Get individual, group, or telephone counseling and support. Programs are available at local hospitals and health centers. Call   your local health department for information about programs in your area.  Spiritual beliefs and practices may help some smokers quit.  Download a "quit meter" on your computer to keep track of quit statistics, such as how long you have gone without smoking, cigarettes not smoked, and money saved.  Get a self-help book about quitting smoking and staying off tobacco. LEARN NEW SKILLS AND BEHAVIORS  Distract yourself from urges to smoke. Talk to someone, go for a walk, or occupy your time with a task.  Change your normal routine. Take a different route to work.  Drink tea instead of coffee. Eat breakfast in a different place.  Reduce your stress. Take a hot bath, exercise, or read a book.  Plan something enjoyable to do every day. Reward yourself for not smoking.  Explore interactive web-based programs that specialize in helping you quit. GET MEDICINE AND USE IT CORRECTLY Medicines can help you stop smoking and decrease the urge to smoke. Combining medicine with the above behavioral methods and support can greatly increase your chances of successfully quitting smoking.  Nicotine replacement therapy helps deliver nicotine to your body without the negative effects and risks of smoking. Nicotine replacement therapy includes nicotine gum, lozenges, inhalers, nasal sprays, and skin patches. Some may be available over-the-counter and others require a prescription.  Antidepressant medicine helps people abstain from smoking, but how this works is unknown. This medicine is available by prescription.  Nicotinic receptor partial agonist medicine simulates the effect of nicotine in your brain. This medicine is available by prescription. Ask your health care Laurann Mcmorris for advice about which medicines to use and how to use them based on your health history. Your health care Ostin Mathey will tell you what side effects to look out for if you choose to be on a medicine or therapy. Carefully read the information on the package. Do not use any other product containing nicotine while using a nicotine replacement product.  RELAPSE OR DIFFICULT SITUATIONS Most relapses occur within the first 3 months after quitting. Do not be discouraged if you start smoking again. Remember, most people try several times before finally quitting. You may have symptoms of withdrawal because your body is used to nicotine. You may crave cigarettes, be irritable, feel very hungry, cough often, get headaches, or have difficulty concentrating. The withdrawal symptoms are only temporary. They are strongest  when you first quit, but they will go away within 10-14 days. To reduce the chances of relapse, try to:  Avoid drinking alcohol. Drinking lowers your chances of successfully quitting.  Reduce the amount of caffeine you consume. Once you quit smoking, the amount of caffeine in your body increases and can give you symptoms, such as a rapid heartbeat, sweating, and anxiety.  Avoid smokers because they can make you want to smoke.  Do not let weight gain distract you. Many smokers will gain weight when they quit, usually less than 10 pounds. Eat a healthy diet and stay active. You can always lose the weight gained after you quit.  Find ways to improve your mood other than smoking. FOR MORE INFORMATION  www.smokefree.gov  Document Released: 11/03/2001 Document Revised: 03/26/2014 Document Reviewed: 02/18/2012 Exit[employer] Patient Information 2015 [employer]. This information is not intended to replace advice given to you by your health care Chaya Dehaan. Make sure you discuss any questions you have with your health care Gerrard Crystal.  

## 2015-08-14 NOTE — Progress Notes (Signed)
Patient ID: Alicia [name], female   DOB: 1956/03/02, 59 y.o.   MRN: [id number]  CC: f/u  HPI: Alicia [name] is a 59 y.o. female here today for a follow up visit.  Patient has past medical history of T2DM, HTN, CVA (6 prior events), cardiomyopathy, medical non-compliance, and tobacco abuse. Patient presents today with main concerns of sinus pressure and productive cough for one week. Patient reports that sputum production is clear color. She denies fevers, chills, nausea, vomiting. Some headaches, rhinitis, sore throat, sneezing, ear pressure. She has tried claritin last week and has noticed a difference.   Patient reports that she checks her sugars 3 times per day with high 200-300's upon awakening. She states that her dinner cbgs are usually 170 and higher. She did not bring a sugar log.She has been to diabetes education several years ago and feels she may benefit from repeating the class.   Allergies  Allergen Reactions  . Other Other (See Comments)    Told not to take any dairy products-per MD   Past Medical History  Diagnosis Date  . Diabetes mellitus, type II   . Hypertension   . H/O: CVA (cerebrovascular accident)     6 strokes, most recent on 05/10/13  . Chronic systolic dysfunction of left ventricle   . NICM (nonischemic cardiomyopathy)     Echo (05/2014):  Mod LVH, EF 20-25%, inf HK, mild MR, mild LAE.  . Tobacco abuse   . Noncompliance    Current Outpatient Prescriptions on File Prior to Visit  Medication Sig Dispense Refill  . amLODipine (NORVASC) 10 MG tablet Take 1 tablet (10 mg total) by mouth daily. 30 tablet 11  . aspirin 81 MG tablet Take 81 mg by mouth daily.    Marland Kitchen atorvastatin (LIPITOR) 40 MG tablet Take 1 tablet (40 mg total) by mouth daily. 30 tablet 6  . carvedilol (COREG) 12.5 MG tablet Take 1 tablet (12.5 mg total) by mouth 2 (two) times daily. 180 tablet 1  . furosemide (LASIX) 40 MG tablet Take 1.5 tablets (60 mg total) by mouth daily. 1.5 mg daily 45 tablet 2   . glimepiride (AMARYL) 4 MG tablet Take 1 tablet (4 mg total) by mouth daily before breakfast. 30 tablet 3  . glucose blood (ACCU-CHEK AVIVA PLUS) test strip USE TO TEST BLOOD SUGARS AS DIRECTED BY PHYSICIAN 100 each 12  . Insulin Glargine (LANTUS) 100 UNIT/ML Solostar Pen Inject 30 Units into the skin daily at 10 pm. 15 mL 3  . insulin lispro (HUMALOG) 100 UNIT/ML KiwkPen Inject 0.15 mLs (15 Units total) into the skin 3 (three) times daily. Administer subQ within 15 minutes before or immediately after a meal 15 mL 3  . Insulin Pen Needle 32G X 4 MM MISC Use 4x a day 200 each 5  . lisinopril (PRINIVIL,ZESTRIL) 20 MG tablet Take 1 tablet (20 mg total) by mouth daily. 30 tablet 3  . polyethylene glycol (MIRALAX / GLYCOLAX) packet Take 17 g by mouth daily as needed for mild constipation. 14 each 0  . TRUEPLUS LANCETS 28G MISC USE TO TEST BLOOD SUGARS AS DIRECTED BY PHYSICIAN 100 each 12   No current facility-administered medications on file prior to visit.   Family History  Problem Relation Age of Onset  . Diabetes Mellitus II Mother   . Hypertension Mother   . CVA Mother   . Heart failure Brother   . Heart attack Neg Hx   . Stroke Mother    Social History  Social History  . Marital Status: Widowed    Spouse Name: N/A  . Number of Children: N/A  . Years of Education: N/A   Occupational History  . Not on file.   Social History Main Topics  . Smoking status: Current Every Day Smoker -- 0.50 packs/day for 25 years    Types: Cigarettes  . Smokeless tobacco: Never Used     Comment: smoking 1 ppd  . Alcohol Use: No  . Drug Use: No  . Sexual Activity: Yes   Other Topics Concern  . Not on file   Social History Narrative   Works in [employer] fundraiser, currently disabled    Review of Systems: Other than what is stated in HPI, all other systems are negative.   Objective:   Filed Vitals:   08/14/15 1023  BP: 130/75  Pulse: 60  Temp: 98.2 F (36.8 C)  Resp: 16    Physical Exam   Constitutional: She is oriented to person, place, and time.  Cardiovascular: Normal rate, regular rhythm and normal heart sounds.   Pulses:      Dorsalis pedis pulses are 2+ on the right side, and 2+ on the left side.       Posterior tibial pulses are 2+ on the right side, and 2+ on the left side.  Pulmonary/Chest: Effort normal and breath sounds normal.  Musculoskeletal: She exhibits no edema.  Feet:  Right Foot:  Skin Integrity: Negative for skin breakdown.  Left Foot:  Skin Integrity: Negative for skin breakdown.  Neurological: She is alert and oriented to person, place, and time.  Skin: Skin is warm and dry.     Lab Results  Component Value Date   WBC 5.8 02/20/2014   HGB 10.9* 02/20/2014   HCT 32.6* 02/20/2014   MCV 81.3 02/20/2014   PLT 244 02/20/2014   Lab Results  Component Value Date   CREATININE 0.70 03/27/2015   BUN 14 03/27/2015   NA 132* 03/27/2015   K 3.6 03/27/2015   CL 95* 03/27/2015   CO2 31 03/27/2015    Lab Results  Component Value Date   HGBA1C 13.10 08/14/2015   Lipid Panel     Component Value Date/Time   CHOL 236* 11/09/2014 0959   TRIG 123.0 11/09/2014 0959   HDL 39.60 11/09/2014 0959   CHOLHDL 6 11/09/2014 0959   VLDL 24.6 11/09/2014 0959   LDLCALC 172* 11/09/2014 0959       Assessment and plan:   Destynee was seen today for diabetes.  Diagnoses and all orders for this visit:  Type 2 diabetes mellitus without complication -     Glucose (CBG) -     HgB A1c---13.1% -     Amb Referral to Nutrition and Diabetic E -     insulin lispro (HUMALOG) 100 UNIT/ML KiwkPen; Inject 0.15 mLs (15 Units total) into the skin 3 (three) times daily. Administer subQ within 15 minutes before or immediately after a meal -     Insulin Glargine (LANTUS) 100 UNIT/ML Solostar Pen; Inject 32 Units into the skin daily at 10 pm. -     glimepiride (AMARYL) 4 MG tablet; Take 1 tablet (4 mg total) by mouth daily before breakfast. -     Ambulatory referral to  Ophthalmology Patients diabetes remains uncontrolled as evidence by hemoglobin a1c >8. Stressed the multiple complications associated with uncontrolled diabetes.  Patient will ncrease Lantus to 32 units and report back to clinic with cbg log in 2 weeks.  Essential hypertension -  lisinopril (PRINIVIL,ZESTRIL) 20 MG tablet; Take 1 tablet (20 mg total) by mouth daily. -     CBC with Differential; Future -     Basic Metabolic Panel; Future Patient blood pressure is stable and may continue on current medication.  Education on diet, exercise, and modifiable risk factors discussed. Will obtain appropriate labs as needed. Will follow up in 3-6 months.   HLD (hyperlipidemia) -     Lipid panel; Future Patients last LDL was 172 on 10/2014, not currently at goal. She is on atorvastatin 10 mg, if her levels are still elevated on repeat then I will increase dose. Diet stressed.   Tobacco use disorder -     nicotine (EQ NICOTINE) 21 mg/24hr patch; Place 1 patch (21 mg total) onto the skin daily. Praised patient for wanting to quit smoking and discussed ways to remain successful. Discussed smoking cessation for 5 minutes given her history of multiple CVA's and diabetes.  Breast cancer screening -     MM DIGITAL SCREENING BILATERAL; Future  Need for influenza vaccination -     Flu Vaccine QUAD 36+ mos IM   Return in about 2 weeks (around 08/28/2015) for Nurse Visit-log review and Lab visit, 3 mo PCP DM.       Lance Bosch, [facility] [phone] 08/14/2015, 10:50 AM

## 2015-08-28 ENCOUNTER — Ambulatory Visit: Payer: Medicaid Other | Attending: Family Medicine | Admitting: Pharmacist

## 2015-08-28 DIAGNOSIS — Z794 Long term (current) use of insulin: Secondary | ICD-10-CM | POA: Insufficient documentation

## 2015-08-28 DIAGNOSIS — Z7984 Long term (current) use of oral hypoglycemic drugs: Secondary | ICD-10-CM | POA: Diagnosis not present

## 2015-08-28 DIAGNOSIS — I10 Essential (primary) hypertension: Secondary | ICD-10-CM | POA: Diagnosis not present

## 2015-08-28 DIAGNOSIS — E119 Type 2 diabetes mellitus without complications: Secondary | ICD-10-CM

## 2015-08-28 DIAGNOSIS — E785 Hyperlipidemia, unspecified: Secondary | ICD-10-CM

## 2015-08-28 LAB — CBC WITH DIFFERENTIAL/PLATELET
BASOS ABS: 0 10*3/uL (ref 0.0–0.1)
Basophils Relative: 0 % (ref 0–1)
EOS ABS: 0.1 10*3/uL (ref 0.0–0.7)
EOS PCT: 2 % (ref 0–5)
HCT: 38.5 % (ref 36.0–46.0)
Hemoglobin: 13.1 g/dL (ref 12.0–15.0)
LYMPHS ABS: 2 10*3/uL (ref 0.7–4.0)
LYMPHS PCT: 28 % (ref 12–46)
MCH: 27.1 pg (ref 26.0–34.0)
MCHC: 34 g/dL (ref 30.0–36.0)
MCV: 79.7 fL (ref 78.0–100.0)
MPV: 11.1 fL (ref 8.6–12.4)
Monocytes Absolute: 0.6 10*3/uL (ref 0.1–1.0)
Monocytes Relative: 8 % (ref 3–12)
NEUTROS PCT: 62 % (ref 43–77)
Neutro Abs: 4.3 10*3/uL (ref 1.7–7.7)
PLATELETS: 269 10*3/uL (ref 150–400)
RBC: 4.83 MIL/uL (ref 3.87–5.11)
RDW: 14.9 % (ref 11.5–15.5)
WBC: 7 10*3/uL (ref 4.0–10.5)

## 2015-08-28 LAB — BASIC METABOLIC PANEL
BUN: 15 mg/dL (ref 7–25)
CHLORIDE: 101 mmol/L (ref 98–110)
CO2: 27 mmol/L (ref 20–31)
Calcium: 9.3 mg/dL (ref 8.6–10.4)
Creat: 0.63 mg/dL (ref 0.50–1.05)
Glucose, Bld: 112 mg/dL — ABNORMAL HIGH (ref 65–99)
POTASSIUM: 4.1 mmol/L (ref 3.5–5.3)
Sodium: 138 mmol/L (ref 135–146)

## 2015-08-28 LAB — LIPID PANEL
CHOL/HDL RATIO: 3.2 ratio (ref <=5.0)
Cholesterol: 159 mg/dL (ref 125–200)
HDL: 50 mg/dL (ref >=46)
LDL CALC: 90 mg/dL (ref <130)
TRIGLYCERIDES: 93 mg/dL (ref <150)
VLDL: 19 mg/dL (ref <30)

## 2015-08-28 MED ORDER — INSULIN GLARGINE 100 UNIT/ML SOLOSTAR PEN: 34.0000 [IU] | PEN_INJECTOR | Freq: Every day | SUBCUTANEOUS | Status: DC

## 2015-08-28 NOTE — Patient Instructions (Addendum)
Thank you for coming to see me!  Increase your Lantus to 34 units daily  Come back and see me in two weeks  Diabetes Mellitus and Food It is important for you to manage your blood sugar (glucose) level. Your blood glucose level can be greatly affected by what you eat. Eating healthier foods in the appropriate amounts throughout the day at about the same time each day will help you control your blood glucose level. It can also help slow or prevent worsening of your diabetes mellitus. Healthy eating may even help you improve the level of your blood pressure and reach or maintain a healthy weight.  General recommendations for healthful eating and cooking habits include:  Eating meals and snacks regularly. Avoid going long periods of time without eating to lose weight.  Eating a diet that consists mainly of plant-based foods, such as fruits, vegetables, nuts, legumes, and whole grains.  Using low-heat cooking methods, such as baking, instead of high-heat cooking methods, such as deep frying. Work with your dietitian to make sure you understand how to use the Nutrition Facts information on food labels. HOW CAN FOOD AFFECT ME? Carbohydrates Carbohydrates affect your blood glucose level more than any other type of food. Your dietitian will help you determine how many carbohydrates to eat at each meal and teach you how to count carbohydrates. Counting carbohydrates is important to keep your blood glucose at a healthy level, especially if you are using insulin or taking certain medicines for diabetes mellitus. Alcohol Alcohol can cause sudden decreases in blood glucose (hypoglycemia), especially if you use insulin or take certain medicines for diabetes mellitus. Hypoglycemia can be a life-threatening condition. Symptoms of hypoglycemia (sleepiness, dizziness, and disorientation) are similar to symptoms of having too much alcohol.  If your health care provider has given you approval to drink alcohol, do  so in moderation and use the following guidelines:  Women should not have more than one drink per day, and men should not have more than two drinks per day. One drink is equal to:  12 oz of beer.  5 oz of wine.  1 oz of hard liquor.  Do not drink on an empty stomach.  Keep yourself hydrated. Have water, diet soda, or unsweetened iced tea.  Regular soda, juice, and other mixers might contain a lot of carbohydrates and should be counted. WHAT FOODS ARE NOT RECOMMENDED? As you make food choices, it is important to remember that all foods are not the same. Some foods have fewer nutrients per serving than other foods, even though they might have the same number of calories or carbohydrates. It is difficult to get your body what it needs when you eat foods with fewer nutrients. Examples of foods that you should avoid that are high in calories and carbohydrates but low in nutrients include:  Trans fats (most processed foods list trans fats on the Nutrition Facts label).  Regular soda.  Juice.  Candy.  Sweets, such as cake, pie, doughnuts, and cookies.  Fried foods. WHAT FOODS CAN I EAT? Eat nutrient-rich foods, which will nourish your body and keep you healthy. The food you should eat also will depend on several factors, including:  The calories you need.  The medicines you take.  Your weight.  Your blood glucose level.  Your blood pressure level.  Your cholesterol level. You should eat a variety of foods, including:  Protein.  Lean cuts of meat.  Proteins low in saturated fats, such as fish, egg whites,  and beans. Avoid processed meats.  Fruits and vegetables.  Fruits and vegetables that may help control blood glucose levels, such as apples, mangoes, and yams.  Dairy products.  Choose fat-free or low-fat dairy products, such as milk, yogurt, and cheese.  Grains, bread, pasta, and rice.  Choose whole grain products, such as multigrain bread, whole oats, and  brown rice. These foods may help control blood pressure.  Fats.  Foods containing healthful fats, such as nuts, avocado, olive oil, canola oil, and fish. DOES EVERYONE WITH DIABETES MELLITUS HAVE THE SAME MEAL PLAN? Because every person with diabetes mellitus is different, there is not one meal plan that works for everyone. It is very important that you meet with a dietitian who will help you create a meal plan that is just right for you.   This information is not intended to replace advice given to you by your health care provider. Make sure you discuss any questions you have with your health care provider.   Document Released: 08/06/2005 Document Revised: 11/30/2014 Document Reviewed: 10/06/2013 Elsevier Interactive Patient Education 2016 [employer] Hypoglycemia Hypoglycemia occurs when the glucose in your blood is too low. Glucose is a type of sugar that is your body's main energy source. Hormones, such as insulin and glucagon, control the level of glucose in the blood. Insulin lowers blood glucose and glucagon increases blood glucose. Having too much insulin in your blood stream, or not eating enough food containing sugar, can result in hypoglycemia. Hypoglycemia can happen to people with or without diabetes. It can develop quickly and can be a medical emergency.  CAUSES   Missing or delaying meals.  Not eating enough carbohydrates at meals.  Taking too much diabetes medicine.  Not timing your oral diabetes medicine or insulin doses with meals, snacks, and exercise.  Nausea and vomiting.  Certain medicines.  Severe illnesses, such as hepatitis, kidney disorders, and certain eating disorders.  Increased activity or exercise without eating something extra or adjusting medicines.  Drinking too much alcohol.  A nerve disorder that affects body functions like your heart rate, blood pressure, and digestion (autonomic neuropathy).  A condition where the stomach muscles do not  function properly (gastroparesis). Therefore, medicines and food may not absorb properly.  Rarely, a tumor of the pancreas can produce too much insulin. SYMPTOMS   Hunger.  Sweating (diaphoresis).  Change in body temperature.  Shakiness.  Headache.  Anxiety.  Lightheadedness.  Irritability.  Difficulty concentrating.  Dry mouth.  Tingling or numbness in the hands or feet.  Restless sleep or sleep disturbances.  Altered speech and coordination.  Change in mental status.  Seizures or prolonged convulsions.  Combativeness.  Drowsiness (lethargic).  Weakness.  Increased heart rate or palpitations.  Confusion.  Pale, gray skin color.  Blurred or double vision.  Fainting. DIAGNOSIS  A physical exam and medical history will be performed. Your caregiver may make a diagnosis based on your symptoms. Blood tests and other lab tests may be performed to confirm a diagnosis. Once the diagnosis is made, your caregiver will see if your signs and symptoms go away once your blood glucose is raised.  TREATMENT  Usually, you can easily treat your hypoglycemia when you notice symptoms.  Check your blood glucose. If it is less than 70 mg/dl, take one of the following:   3-4 glucose tablets.    cup juice.    cup regular soda.   1 cup skim milk.   -1 tube of glucose gel.  5-6 hard candies.   Avoid high-fat drinks or food that may delay a rise in blood glucose levels.  Do not take more than the recommended amount of sugary foods, drinks, gel, or tablets. Doing so will cause your blood glucose to go too high.   Wait 10-15 minutes and recheck your blood glucose. If it is still less than 70 mg/dl or below your target range, repeat treatment.   Eat a snack if it is more than 1 hour until your next meal.  There may be a time when your blood glucose may go so low that you are unable to treat yourself at home when you start to notice symptoms. You may need  someone to help you. You may even faint or be unable to swallow. If you cannot treat yourself, someone will need to bring you to the hospital.  Freedom  If you have diabetes, follow your diabetes management plan by:  Taking your medicines as directed.  Following your exercise plan.  Following your meal plan. Do not skip meals. Eat on time.  Testing your blood glucose regularly. Check your blood glucose before and after exercise. If you exercise longer or different than usual, be sure to check blood glucose more frequently.  Wearing your medical alert jewelry that says you have diabetes.  Identify the cause of your hypoglycemia. Then, develop ways to prevent the recurrence of hypoglycemia.  Do not take a hot bath or shower right after an insulin shot.  Always carry treatment with you. Glucose tablets are the easiest to carry.  If you are going to drink alcohol, drink it only with meals.  Tell friends or family members ways to keep you safe during a seizure. This may include removing hard or sharp objects from the area or turning you on your side.  Maintain a healthy weight. SEEK MEDICAL CARE IF:   You are having problems keeping your blood glucose in your target range.  You are having frequent episodes of hypoglycemia.  You feel you might be having side effects from your medicines.  You are not sure why your blood glucose is dropping so low.  You notice a change in vision or a new problem with your vision. SEEK IMMEDIATE MEDICAL CARE IF:   Confusion develops.  A change in mental status occurs.  The inability to swallow develops.  Fainting occurs.   This information is not intended to replace advice given to you by your health care provider. Make sure you discuss any questions you have with your health care provider.   Document Released: 11/09/2005 Document Revised: 11/14/2013 Document Reviewed: 07/16/2015 Elsevier Interactive Patient Education [employer]

## 2015-08-28 NOTE — Progress Notes (Signed)
S:    Patient arrives in good spirits.  Presents for diabetes follow up.   Patient reports adherence with medications. Current diabetes medications include Lantus 32 daily, Humalog 15 units TID with meals, glimepiride 4 mg   Patient denies hypoglycemic events.  Patient reported dietary habits: patient is trying to eat healthier foods and less carbs  Patient reported exercise habits: patient is starting to walk more   Patient denies nocturia.  Patient denies neuropathy. Patient denies visual changes. She is going to the eye doctor on September 17, 2015 Patient reports self foot exams.   Patient denies using the nicotine patch and reports that she is just not ready to quit smoking.   O:  Lab Results  Component Value Date   HGBA1C 13.10 08/14/2015    Home fasting CBG: 115 - 302 (most <160) 2 hour post-prandial/random CBG: 99 - 260 (most < 200).  A/P: Diabetes currently uncontrolled based on A1c of 13.1.   Patient denies hypoglycemic events and is able to verbalize appropriate hypoglycemia management plan.  Patient reports adherence with medication. Control is suboptimal due to dietary indiscretion and sedentary lifestyle.  Increased dose of basal insulin Lantus (insulin glargine) to 34 units daily. Continued rapid insulin Humalog (insulin lispro) 15 units with meals and glimepiride 4 mg daily. Will focus on getting fasting blood glucose to goal first and then adjust Humalog if needed. Patient educated on blood glucose monitoring and basal vs bolus insulins.  Patient not interested in quitting smoking at this time. Will revisit at next appointment with me.  Next A1C anticipated December 2016.    Written patient instructions provided.  Total time in face to face counseling 20 minutes.   Follow up in Pharmacist Clinic Visit in two weeks.

## 2015-08-29 ENCOUNTER — Telehealth: Payer: Self-pay

## 2015-08-29 NOTE — Telephone Encounter (Signed)
Patient not available Left message on voice mail to return our call 

## 2015-08-29 NOTE — Telephone Encounter (Signed)
-----   Message from Lance Bosch, NP sent at 08/28/2015  9:34 PM EDT ----- Labs look good. Great improvement in cholesterol. Keep up the good work

## 2015-09-11 ENCOUNTER — Ambulatory Visit: Payer: Medicaid Other | Attending: Internal Medicine | Admitting: Pharmacist

## 2015-09-11 DIAGNOSIS — E119 Type 2 diabetes mellitus without complications: Secondary | ICD-10-CM | POA: Diagnosis not present

## 2015-09-11 DIAGNOSIS — Z7984 Long term (current) use of oral hypoglycemic drugs: Secondary | ICD-10-CM | POA: Insufficient documentation

## 2015-09-11 DIAGNOSIS — Z794 Long term (current) use of insulin: Secondary | ICD-10-CM | POA: Diagnosis not present

## 2015-09-11 MED ORDER — INSULIN GLARGINE 100 UNIT/ML SOLOSTAR PEN: 36.0000 [IU] | PEN_INJECTOR | Freq: Every day | SUBCUTANEOUS | Status: DC

## 2015-09-11 NOTE — Patient Instructions (Signed)
You are doing a GREAT job!!!  Increase your Lantus to 36 units every day  Please let me know if you have multiple readings <80  Come back and see me in two weeks

## 2015-09-11 NOTE — Progress Notes (Signed)
S:    Patient arrives in good spirits though she reports that she recently had food poisoning.  Presents for diabetes follow up.   Patient reports adherence with medications. Current diabetes medications include Lantus 34 daily, Humalog 15 units TID with meals, glimepiride 4 mg   Patient denies hypoglycemic events.  Patient reported dietary habits: patient is trying to eat healthier foods and less carbs  Patient reported exercise habits: patient is starting to walk more   Patient denies nocturia.  Patient denies neuropathy. Patient denies visual changes. She is going to the eye doctor on September 17, 2015 Patient reports self foot exams.    O:  Lab Results  Component Value Date   HGBA1C 13.10 08/14/2015    Home fasting CBG: 93 - 233 (most <140) 2 hour post-prandial/random CBG: 130s - 190s (most < 180).  A/P: Diabetes currently uncontrolled based on A1c of 13.1 but improved based on home CBGs.   Patient denies hypoglycemic events and is able to verbalize appropriate hypoglycemia management plan.  Patient reports adherence with medication. Control is suboptimal due to dietary indiscretion and sedentary lifestyle.  Increased dose of basal insulin Lantus (insulin glargine) to 36 units daily. I expect that this will get more of the fasting blood glucoses to <120.  This will also get the basal and bolus doses of insulin more balanced. Continued rapid insulin Humalog (insulin lispro) 15 units with meals and glimepiride 4 mg daily. I do not see any need for any adjustment to post-prandial Humalog as this time as vast majority of readings are at goal. Congratulated patient on progress and encouraged her to keep up the great work.   Next A1C anticipated December 2016.   Written patient instructions provided.  Total time in face to face counseling 20 minutes.   Follow up in Pharmacist Clinic Visit in 2 weeks.

## 2015-09-17 LAB — HM DIABETES EYE EXAM

## 2015-09-25 ENCOUNTER — Ambulatory Visit: Payer: Medicaid Other | Attending: Internal Medicine | Admitting: Pharmacist

## 2015-09-25 DIAGNOSIS — E119 Type 2 diabetes mellitus without complications: Secondary | ICD-10-CM | POA: Diagnosis present

## 2015-09-25 DIAGNOSIS — Z794 Long term (current) use of insulin: Secondary | ICD-10-CM | POA: Insufficient documentation

## 2015-09-25 MED ORDER — INSULIN GLARGINE 100 UNIT/ML SOLOSTAR PEN: 38.0000 [IU] | PEN_INJECTOR | Freq: Every day | SUBCUTANEOUS | Status: DC

## 2015-09-25 NOTE — Patient Instructions (Signed)
Increase your Lantus to 38 units daily.  Make sure you eat if you take the Humalog!!!  You are doing GREAT! I am so happy to see you feeling better!

## 2015-09-25 NOTE — Progress Notes (Signed)
S:    Patient arrives in good spirits though she reports that she recently had food poisoning.  Presents for diabetes follow up.   Patient reports adherence with medications. Current diabetes medications include Lantus 36 daily, Humalog 15 units TID with meals, glimepiride 4 mg   She took her Humalog twice when not she did not eat a meal. She denied any changes in her blood glucose from this, such as hypoglycemia.  Patient denies hypoglycemic events.  Patient reported dietary habits: patient tries to eat three meals a day but has missed two meals in the last two weeks.  Patient reported exercise habits: walking   Patient denies nocturia.  Patient denies neuropathy. Patient denies visual changes. She went to the eye doctor and got a new prescription for her eye glasses. Patient reports self foot exams.    O:  Lab Results  Component Value Date   HGBA1C 13.10 08/14/2015    Home fasting CBG: 97 - 192, one excursion to 240 2 hour post-prandial/random CBG: 110s - 190s, two excursions to 210, 272 (most < 180).  A/P: Diabetes currently uncontrolled based on A1c of 13.1 but improved based on home CBGs.   Patient denies hypoglycemic events and is able to verbalize appropriate hypoglycemia management plan.  Patient reports adherence with medication. Control is suboptimal due to dietary indiscretion and sedentary lifestyle.  Increased dose of basal insulin Lantus (insulin glargine) to 38 units daily.Continued rapid insulin Humalog (insulin lispro) 15 units with meals (and stressed to patient to only take it if she eats a meal) and glimepiride 4 mg daily. I do not see any need for any adjustment to post-prandial Humalog as this time as vast majority of readings are at goal. Congratulated patient on progress and encouraged her to keep up the great work - she is very excited about her progress after having blood glucoses in the 300s and 400s.   Next A1C anticipated December 2016. I expect that this  A1c will be much improved.  Written patient instructions provided.  Total time in face to face counseling 20 minutes.  Follow up in Pharmacist Clinic Visit in 2 weeks.

## 2015-09-30 ENCOUNTER — Ambulatory Visit (INDEPENDENT_AMBULATORY_CARE_PROVIDER_SITE_OTHER): Payer: Medicaid Other | Admitting: Cardiology

## 2015-09-30 ENCOUNTER — Encounter: Payer: Self-pay | Admitting: Cardiology

## 2015-09-30 VITALS — BP 130/82 | HR 62 | Ht 67.0 in | Wt 188.0 lb

## 2015-09-30 DIAGNOSIS — M791 Myalgia, unspecified site: Secondary | ICD-10-CM

## 2015-09-30 DIAGNOSIS — E785 Hyperlipidemia, unspecified: Secondary | ICD-10-CM

## 2015-09-30 DIAGNOSIS — F17200 Nicotine dependence, unspecified, uncomplicated: Secondary | ICD-10-CM

## 2015-09-30 DIAGNOSIS — I10 Essential (primary) hypertension: Secondary | ICD-10-CM | POA: Diagnosis not present

## 2015-09-30 DIAGNOSIS — I5022 Chronic systolic (congestive) heart failure: Secondary | ICD-10-CM | POA: Diagnosis not present

## 2015-09-30 DIAGNOSIS — R42 Dizziness and giddiness: Secondary | ICD-10-CM

## 2015-09-30 DIAGNOSIS — I2510 Atherosclerotic heart disease of native coronary artery without angina pectoris: Secondary | ICD-10-CM

## 2015-09-30 MED ORDER — AMLODIPINE BESYLATE 10 MG PO TABS: 10.0000 mg | ORAL_TABLET | Freq: Every day | ORAL | Status: DC

## 2015-09-30 MED ORDER — CARVEDILOL 12.5 MG PO TABS: 12.5000 mg | ORAL_TABLET | Freq: Two times a day (BID) | ORAL | Status: DC

## 2015-09-30 MED ORDER — ATORVASTATIN CALCIUM 40 MG PO TABS: 40.0000 mg | ORAL_TABLET | Freq: Every day | ORAL | Status: DC

## 2015-09-30 MED ORDER — LISINOPRIL 20 MG PO TABS: 20.0000 mg | ORAL_TABLET | Freq: Every day | ORAL | Status: DC

## 2015-09-30 NOTE — Patient Instructions (Signed)

## 2015-09-30 NOTE — Progress Notes (Signed)
[facility]. [address], [address] [place], [place]  [address] Phone: [phone] Fax:  [phone]  Date:  09/30/2015   ID:  Alicia [name], DOB [birth year], MRN [id number]  PCP:  Lance Bosch, NP   History of Present Illness: Alicia [name] is a 59 y.o. female with a history of chronic systolic heart failure ejection fraction ranging from 25-40%, nuclear stress test with no ischemia, diabetes, hypertension, prior stroke here for followup. She's not having any symptoms of heart failure however.  No syncope, no retention of fluid. No chest pain. It was noted previously that her Coreg was only being taken once a day. We have changed this.  Her ventral hernia seems to be stable. Post surgery.  Trying to work on her diabetes.  Studies:  - LHC (02/19/14): Mid RCA 30-40, distal RCA 60, EF 25%.  - Echo (05/11/13): Moderate LVH, EF 30-40%.  - Nuclear (07/23/09 - in [facility]): No ischemia.  - Holter (03/2012): NSR, occasional PVCs, no significant arrhythmia  Recent Labs:  02/18/2014: Pro B Natriuretic peptide (BNP) 2876.0*; TSH 1.032  02/20/2014: Hemoglobin 10.9*  03/27/2014: ALT 13; HDL Cholesterol by NMR 49.50; LDL (calc) 135*  04/02/2014: Creatinine 0.5; Potassium 3.8      Wt Readings from Last 3 Encounters:  09/30/15 188 lb (85.276 kg)  08/14/15 180 lb (81.647 kg)  06/17/15 174 lb 9.6 oz (79.198 kg)     Past Medical History  Diagnosis Date  . Diabetes mellitus, type II ([place])   . Hypertension   . H/O: CVA (cerebrovascular accident)     6 strokes, most recent on 05/10/13  . Chronic systolic dysfunction of left ventricle   . NICM (nonischemic cardiomyopathy) ([place])     Echo (05/2014):  Mod LVH, EF 20-25%, inf HK, mild MR, mild LAE.  . Tobacco abuse   . Noncompliance     Past Surgical History  Procedure Laterality Date  . Tubal ligation    . Cardiac catheterization    . Laparotomy N/A 01/15/2014    Procedure: EXPLORATORY LAPAROTOMY SMALL BOWEL RESECTION;  Surgeon: Imogene Burn.  Georgette Dover, MD;  Location: [facility];  Service: General;  Laterality: N/A;  . Lysis of adhesion N/A 01/15/2014    Procedure: LYSIS OF ADHESION;  Surgeon: Imogene Burn. Georgette Dover, MD;  Location: [facility];  Service: General;  Laterality: N/A;  . Left heart catheterization with coronary angiogram N/A 02/19/2014    Procedure: LEFT HEART CATHETERIZATION WITH CORONARY ANGIOGRAM;  Surgeon: Troy Sine, MD;  Location[facility] C[facility] LAB;  Service: Cardiovascular;  Laterality: N/A;    Current Outpatient Prescriptions  Medication Sig Dispense Refill  . amLODipine (NORVASC) 10 MG tablet Take 1 tablet (10 mg total) by mouth daily. 90 tablet 3  . aspirin 81 MG tablet Take 81 mg by mouth daily.    Marland Kitchen atorvastatin (LIPITOR) 40 MG tablet Take 1 tablet (40 mg total) by mouth daily. 90 tablet 3  . carvedilol (COREG) 12.5 MG tablet Take 1 tablet (12.5 mg total) by mouth 2 (two) times daily. 180 tablet 3  . furosemide (LASIX) 40 MG tablet Take 1.5 tablets (60 mg total) by mouth daily. 1.5 mg daily 45 tablet 2  . glimepiride (AMARYL) 4 MG tablet Take 1 tablet (4 mg total) by mouth daily before breakfast. 30 tablet 3  . glucose blood (ACCU-CHEK AVIVA PLUS) test strip USE TO TEST BLOOD SUGARS AS DIRECTED BY PHYSICIAN 100 each 12  . Insulin Glargine (LANTUS) 100 UNIT/ML Solostar  Pen Inject 38 Units into the skin daily at 10 pm. 15 mL 3  . insulin lispro (HUMALOG) 100 UNIT/ML KiwkPen Inject 0.15 mLs (15 Units total) into the skin 3 (three) times daily. Administer subQ within 15 minutes before or immediately after a meal 15 mL 3  . Insulin Pen Needle 32G X 4 MM MISC Use 4x a day 200 each 5  . lisinopril (PRINIVIL,ZESTRIL) 20 MG tablet Take 1 tablet (20 mg total) by mouth daily. 90 tablet 3  . polyethylene glycol (MIRALAX / GLYCOLAX) packet Take 17 g by mouth daily as needed for mild constipation. 14 each 0  . TRUEPLUS LANCETS 28G MISC USE TO TEST BLOOD SUGARS AS DIRECTED BY PHYSICIAN 100 each 12   No current facility-administered medications  for this visit.    Allergies:    Allergies  Allergen Reactions  . Other Other (See Comments)    Told not to take any dairy products-per MD    Social History:  The patient  reports that she has been smoking Cigarettes.  She has a 12.5 pack-year smoking history. She has never used smokeless tobacco. She reports that she does not drink alcohol or use illicit drugs.   Family History  Problem Relation Age of Onset  . Diabetes Mellitus II Mother   . Hypertension Mother   . CVA Mother   . Heart failure Brother   . Heart attack Neg Hx   . Stroke Mother     ROS:  Please see the history of present illness.   No syncope, no orthopnea, no chest pain, no PND   All other systems reviewed and negative.   PHYSICAL EXAM: VS:  BP 130/82 mmHg  Pulse 62  Ht 5\' 7"  (1.702 m)  Wt 188 lb (85.276 kg)  BMI 29.44 kg/m2 Well nourished, well developed, in no acute distress HEENT: normal, [place]/AT, EOMI Neck: no JVD, normal carotid upstroke, no bruit Cardiac:  normal S1, S2; RRR; no murmur Lungs:  clear to auscultation bilaterally, no wheezing, rhonchi or rales Abd: soft, nontender, no hepatomegaly, no bruitsAbdominal scar Ext: no edema, difficult distal pulses Skin: warm and dry GU: deferred Neuro: no focal abnormalities noted, AAO x 3  EKG:  Today 09/30/15-sinus rhythm, 62, poor R-wave progression, T-wave inversion lateral leads, left anterior fascicular block. Personally viewed     ASSESSMENT AND PLAN:  1. Chronic systolic heart failure-nonischemic. Her blood pressure is well controlled today. She is taking her medications. Reviewed as above. Beta blocker, ACE inhibitor. I would like for her to take her carvedilol twice a day.  2. Hypertension-controlled today. Continue to encourage medication compliance. I will check a basic metabolic profile. 3. Hyperlipidemia-atorvastatin 40. LDL 90 less than 100. 4. Coronary artery disease-nonobstructive disease. Continue with secondary prevention.  Statin. 5. Prior ventral hernia repair- strangulation. 2/15 6. Diabetes-hemoglobin A1c 13-she is working hard with Dr. Feliciana Rossetti. 7. Tobacco use - cessation.  8. 6 month followup  9. Appreciate Dr. Rayann Heman consultation. They discussed ICD, no ICD.  Signed, Candee Furbish, MD [facility]  09/30/2015 9:25 AM

## 2015-10-10 ENCOUNTER — Ambulatory Visit: Payer: Medicaid Other | Attending: Internal Medicine | Admitting: Pharmacist

## 2015-10-10 DIAGNOSIS — E119 Type 2 diabetes mellitus without complications: Secondary | ICD-10-CM

## 2015-10-10 DIAGNOSIS — E1165 Type 2 diabetes mellitus with hyperglycemia: Secondary | ICD-10-CM | POA: Insufficient documentation

## 2015-10-10 MED ORDER — INSULIN GLARGINE 100 UNIT/ML SOLOSTAR PEN: 40.0000 [IU] | PEN_INJECTOR | Freq: Every day | SUBCUTANEOUS | Status: DC

## 2015-10-10 NOTE — Patient Instructions (Signed)
You are doing AWESOME Alicia [name]! Keep up the great work!  Increase your Lantus to 40 units once a day.  Come back and see me in 2-3 weeks.

## 2015-10-10 NOTE — Progress Notes (Signed)
S:    Patient arrives in good spirits though she reports that she recently had food poisoning.  Presents for diabetes follow up.   Patient reports adherence with medications. Current diabetes medications include Lantus 38 daily, Humalog 15 units TID with meals, glimepiride 4 mg   Patient denies hypoglycemic events.  Patient reported dietary habits: patient very motivated after her blood pressure and cholesterol results to keep eating healthier. She will drink some Pepsi from time to time but will not drink a whole can - she gives the rest to her grandson. She is trying to increase her water intake.   Patient reported exercise habits: walking   Patient denies nocturia.  Patient denies neuropathy. Patient denies visual changes.  Patient reports self foot exams.    O:  Lab Results  Component Value Date   HGBA1C 13.10 08/14/2015    Home fasting CBG: 110 - 244, most <160 2 hour post-prandial/random CBG: 117 - 239, most < 180  A/P: Diabetes currently uncontrolled based on A1c of 13.1 but improved based on home CBGs.   Patient denies hypoglycemic events and is able to verbalize appropriate hypoglycemia management plan.  Patient reports adherence with medication. Control is suboptimal due to dietary indiscretion and sedentary lifestyle.  Increased dose of basal insulin Lantus (insulin glargine) to 40 units daily.Continued rapid insulin Humalog (insulin lispro) 15 units with meals (and stressed to patient to only take it if she eats a meal) and glimepiride 4 mg daily. Congratulated patient on continued motivation to take charge of her health. Encouraged her to continue to drink more water and to move around by playing with her grandson.   Next A1C anticipated December 2016. I expect that this A1c will be much improved.  Written patient instructions provided.  Total time in face to face counseling 20 minutes.  Follow up in Pharmacist Clinic Visit in 2-3 weeks. After that, will have patient  follow up with Chari Manning, NP, for her 3 month PCP appointment.

## 2015-10-11 ENCOUNTER — Other Ambulatory Visit: Payer: Medicaid Other

## 2015-10-31 ENCOUNTER — Ambulatory Visit: Payer: Medicare Other | Attending: Internal Medicine | Admitting: Pharmacist

## 2015-10-31 DIAGNOSIS — Z794 Long term (current) use of insulin: Secondary | ICD-10-CM | POA: Insufficient documentation

## 2015-10-31 DIAGNOSIS — E119 Type 2 diabetes mellitus without complications: Secondary | ICD-10-CM | POA: Insufficient documentation

## 2015-10-31 MED ORDER — INSULIN LISPRO 100 UNIT/ML (KWIKPEN): 16.0000 [IU] | PEN_INJECTOR | Freq: Three times a day (TID) | SUBCUTANEOUS | Status: DC

## 2015-10-31 NOTE — Patient Instructions (Addendum)
Thanks for coming to see me!  Increase your Humalog to 16 units before each meal.  Make an appointment with Chari Manning for your 3 month check up.  Make sure you give the pharmacy your new insurance card so that your meter strips are covered!!  If you ever need a cheap meter and strips - go to [employer] and get the [employer] brand

## 2015-10-31 NOTE — Progress Notes (Signed)
S:    Patient arrives in good spirits though she reports that she recently had food poisoning.  Presents for diabetes follow up.   Patient reports adherence with medications. Current diabetes medications include Lantus 40 daily, Humalog 15 units TID with meals, glimepiride 4 mg   Patient denies hypoglycemic events.  Patient reported dietary habits: she has stopped drinking Pepsi completely except for one time she had indigestion and took 2 sips of a Pepsi.  Patient reported exercise habits: walking   Patient denies nocturia.  Patient denies neuropathy. Patient denies visual changes.  Patient reports self foot exams.   Reports that her meter was broken for a few days over thanksgiving and she had to get a new one and that it was very expensive.   O:  Lab Results  Component Value Date   HGBA1C 13.10 08/14/2015   Patient does not have her log book today.   Reported Home fasting CBG: 120-130s Reported 2 hour post-prandial/random CBG: 200s  A/P: Diabetes currently uncontrolled based on A1c of 13.1 but improved based on home CBGs, at least for her fasting blood glucose.   Patient denies hypoglycemic events and is able to verbalize appropriate hypoglycemia management plan.  Patient reports adherence with medication. Control is suboptimal due to dietary indiscretion and sedentary lifestyle.  Continued basal insulin Lantus (insulin glargine) at 40 units daily.Increased dose of rapid insulin Humalog (insulin lispro) to 16 units with meals and continued glimepiride 4 mg daily. Do not want to adjust any more without seeing her log book. Patient will also have Medicare Part D in January 2017 so educated her on how to get the meter and strips filled through Part D.   Next A1C anticipated late December 2016. I expect that this A1c will be much improved.  Written patient instructions provided.  Total time in face to face counseling 20 minutes.  Follow up in Pharmacist Clinic Visit in after visit  with Chari Manning, NP - she is due for her 3 month follow up visit with PCP.

## 2015-11-11 ENCOUNTER — Ambulatory Visit: Payer: Medicare Other | Attending: Internal Medicine | Admitting: Internal Medicine

## 2015-11-11 ENCOUNTER — Encounter: Payer: Self-pay | Admitting: Internal Medicine

## 2015-11-11 VITALS — BP 130/77 | HR 60 | Temp 98.0°F | Resp 16 | Ht 67.0 in | Wt 194.2 lb

## 2015-11-11 DIAGNOSIS — Z1239 Encounter for other screening for malignant neoplasm of breast: Secondary | ICD-10-CM

## 2015-11-11 DIAGNOSIS — Z794 Long term (current) use of insulin: Secondary | ICD-10-CM | POA: Insufficient documentation

## 2015-11-11 DIAGNOSIS — Z1231 Encounter for screening mammogram for malignant neoplasm of breast: Secondary | ICD-10-CM | POA: Diagnosis not present

## 2015-11-11 DIAGNOSIS — IMO0001 Reserved for inherently not codable concepts without codable children: Secondary | ICD-10-CM

## 2015-11-11 DIAGNOSIS — E1165 Type 2 diabetes mellitus with hyperglycemia: Secondary | ICD-10-CM | POA: Diagnosis present

## 2015-11-11 DIAGNOSIS — Z1211 Encounter for screening for malignant neoplasm of colon: Secondary | ICD-10-CM | POA: Insufficient documentation

## 2015-11-11 DIAGNOSIS — Z7982 Long term (current) use of aspirin: Secondary | ICD-10-CM | POA: Diagnosis not present

## 2015-11-11 DIAGNOSIS — Z79899 Other long term (current) drug therapy: Secondary | ICD-10-CM | POA: Insufficient documentation

## 2015-11-11 LAB — POCT GLYCOSYLATED HEMOGLOBIN (HGB A1C): Hemoglobin A1C: 12

## 2015-11-11 LAB — GLUCOSE, POCT (MANUAL RESULT ENTRY): POC Glucose: 202 mg/dl — AB (ref 70–99)

## 2015-11-11 NOTE — Progress Notes (Signed)
Patient here for her routine follow up on her diabetes Patient stated she already received her flu vaccines Does not need any refills at this time

## 2015-11-11 NOTE — Progress Notes (Signed)
Patient ID: Alicia [name], female   DOB: 06-10-1956, 59 y.o.   MRN: [id number] SUBJECTIVE: 59 y.o. female for follow up of diabetes. Diabetic Review of Systems - medication compliance: compliant all of the time, diabetic diet compliance: compliant all of the time, home glucose monitoring: is performed regularly, further diabetic ROS: no polyuria or polydipsia, no chest pain, dyspnea or TIA's, no numbness, tingling or pain in extremities, no hypoglycemia, blurry vision.  Other symptoms and concerns: Only eats twice per day for breakfast and dinner because she does not have a appetite. She states that when she does eat it is only small bits of food.    Current Outpatient Prescriptions  Medication Sig Dispense Refill  . amLODipine (NORVASC) 10 MG tablet Take 1 tablet (10 mg total) by mouth daily. 90 tablet 3  . aspirin 81 MG tablet Take 81 mg by mouth daily.    Marland Kitchen atorvastatin (LIPITOR) 40 MG tablet Take 1 tablet (40 mg total) by mouth daily. 90 tablet 3  . carvedilol (COREG) 12.5 MG tablet Take 1 tablet (12.5 mg total) by mouth 2 (two) times daily. 180 tablet 3  . furosemide (LASIX) 40 MG tablet Take 1.5 tablets (60 mg total) by mouth daily. 1.5 mg daily 45 tablet 2  . glimepiride (AMARYL) 4 MG tablet Take 1 tablet (4 mg total) by mouth daily before breakfast. 30 tablet 3  . Insulin Glargine (LANTUS) 100 UNIT/ML Solostar Pen Inject 40 Units into the skin daily at 10 pm. 15 mL 3  . insulin lispro (HUMALOG) 100 UNIT/ML KiwkPen Inject 0.16 mLs (16 Units total) into the skin 3 (three) times daily. Administer subQ within 15 minutes before or immediately after a meal 15 mL 3  . lisinopril (PRINIVIL,ZESTRIL) 20 MG tablet Take 1 tablet (20 mg total) by mouth daily. 90 tablet 3  . glucose blood (ACCU-CHEK AVIVA PLUS) test strip USE TO TEST BLOOD SUGARS AS DIRECTED BY PHYSICIAN 100 each 12  . Insulin Pen Needle 32G X 4 MM MISC Use 4x a day 200 each 5  . polyethylene glycol (MIRALAX / GLYCOLAX) packet Take 17 g  by mouth daily as needed for mild constipation. 14 each 0  . TRUEPLUS LANCETS 28G MISC USE TO TEST BLOOD SUGARS AS DIRECTED BY PHYSICIAN 100 each 12   No current facility-administered medications for this visit.    OBJECTIVE: Appearance: alert, well appearing, and in no distress, oriented to person, place, and time and overweight. BP 158/83 mmHg  Pulse 61  Temp(Src) 98 F (36.7 C)  Resp 16  Ht 5\' 7"  (1.702 m)  Wt 194 lb 3.2 oz (88.089 kg)  BMI 30.41 kg/m2  SpO2 99%  Exam: heart sounds normal rate, regular rhythm, normal S1, S2, no murmurs, rubs, clicks or gallops, no JVD, chest clear, no carotid bruits  ASSESSMENT: Alicia [name] was seen today for follow-up.  Diagnoses and all orders for this visit:  Uncontrolled type 2 diabetes mellitus without complication, with long-term current use of insulin (HCC) -     Glucose (CBG) -     HgB A1c Patients diabetes remains uncontrolled as evidence by hemoglobin a1c >8.  Explained to patient that fasting for several hours can cause her body to produce more sugar for energy which may be causing elevated sugars. I have given her coupons for Glucerna shakes. Stressed the multiple complications associated with uncontrolled diabetes.  Patient will stay on current medication dose and report back to clinic with cbg log in 3 weeks with me.  Colon cancer screening -     Ambulatory referral to Gastroenterology  Breast cancer screening, high risk patient -     MM Digital Screening; Future   Return for 3 weeks pap and log review, 3 mo PCP .   Lance Bosch, NP 11/11/2015 2:26 PM

## 2015-11-25 MED FILL — FUROSEMIDE 40 MG TABLET: 40 | 30 days supply | Qty: 45 | Fill #2

## 2015-11-25 MED FILL — LISINOPRIL 20 MG TABLET: 20 | 30 days supply | Qty: 30 | Fill #3

## 2015-11-25 MED FILL — ATORVASTATIN 40 MG TABLET: 40 | 30 days supply | Qty: 30 | Fill #3

## 2015-11-25 MED FILL — AMLODIPINE BESYLATE 10 MG T: 10 | 30 days supply | Qty: 30 | Fill #1

## 2015-12-04 ENCOUNTER — Ambulatory Visit: Payer: Medicaid Other | Admitting: Internal Medicine

## 2015-12-18 MED FILL — LANTUS SOLOSTAR 100 UNITS/M: 100 | 30 days supply | Qty: 15 | Fill #1

## 2015-12-18 MED FILL — HUMALOG 100 UNITS/ML KWIKPE: 100 | 30 days supply | Qty: 15 | Fill #1

## 2015-12-18 MED FILL — CARVEDILOL 12.5 MG TABLET: 12.5 | 30 days supply | Qty: 60 | Fill #2

## 2015-12-20 MED FILL — GLIMEPIRIDE 4 MG TABLET: 4 | 30 days supply | Qty: 30 | Fill #3

## 2015-12-27 MED FILL — AMLODIPINE BESYLATE 10 MG T: 10 | 30 days supply | Qty: 30 | Fill #2

## 2015-12-27 MED FILL — LISINOPRIL 20 MG TABLET: 20 | 30 days supply | Qty: 30 | Fill #0

## 2015-12-27 MED FILL — ATORVASTATIN 40 MG TABLET: 40 | 30 days supply | Qty: 30 | Fill #4

## 2015-12-31 ENCOUNTER — Other Ambulatory Visit: Payer: Self-pay | Admitting: Internal Medicine

## 2015-12-31 MED FILL — ULTICARE PEN NDL 4MM 32G: 32G X 4 MM | 25 days supply | Qty: 100 | Fill #0

## 2016-01-03 ENCOUNTER — Other Ambulatory Visit (HOSPITAL_COMMUNITY)
Admission: RE | Admit: 2016-01-03 | Discharge: 2016-01-03 | Disposition: A | Discharge status: Home / Self Care | Payer: Medicare Other | Source: Ambulatory Visit | Attending: Internal Medicine | Admitting: Internal Medicine

## 2016-01-03 ENCOUNTER — Encounter: Payer: Self-pay | Admitting: Internal Medicine

## 2016-01-03 ENCOUNTER — Ambulatory Visit: Payer: Medicare Other | Attending: Internal Medicine | Admitting: Internal Medicine

## 2016-01-03 VITALS — BP 152/85 | HR 59 | Temp 98.0°F | Resp 16 | Ht 67.0 in | Wt 193.0 lb

## 2016-01-03 DIAGNOSIS — E119 Type 2 diabetes mellitus without complications: Secondary | ICD-10-CM | POA: Insufficient documentation

## 2016-01-03 DIAGNOSIS — Z8673 Personal history of transient ischemic attack (TIA), and cerebral infarction without residual deficits: Secondary | ICD-10-CM | POA: Diagnosis not present

## 2016-01-03 DIAGNOSIS — I10 Essential (primary) hypertension: Secondary | ICD-10-CM | POA: Diagnosis not present

## 2016-01-03 DIAGNOSIS — N760 Acute vaginitis: Secondary | ICD-10-CM | POA: Diagnosis not present

## 2016-01-03 DIAGNOSIS — F17210 Nicotine dependence, cigarettes, uncomplicated: Secondary | ICD-10-CM | POA: Diagnosis not present

## 2016-01-03 DIAGNOSIS — Z79899 Other long term (current) drug therapy: Secondary | ICD-10-CM | POA: Diagnosis not present

## 2016-01-03 DIAGNOSIS — Z794 Long term (current) use of insulin: Secondary | ICD-10-CM | POA: Diagnosis not present

## 2016-01-03 DIAGNOSIS — I429 Cardiomyopathy, unspecified: Secondary | ICD-10-CM | POA: Insufficient documentation

## 2016-01-03 DIAGNOSIS — B3731 Acute candidiasis of vulva and vagina: Secondary | ICD-10-CM

## 2016-01-03 DIAGNOSIS — Z1272 Encounter for screening for malignant neoplasm of vagina: Secondary | ICD-10-CM | POA: Insufficient documentation

## 2016-01-03 DIAGNOSIS — Z1151 Encounter for screening for human papillomavirus (HPV): Secondary | ICD-10-CM | POA: Diagnosis not present

## 2016-01-03 DIAGNOSIS — Z124 Encounter for screening for malignant neoplasm of cervix: Secondary | ICD-10-CM | POA: Diagnosis not present

## 2016-01-03 DIAGNOSIS — B373 Candidiasis of vulva and vagina: Secondary | ICD-10-CM | POA: Insufficient documentation

## 2016-01-03 DIAGNOSIS — R945 Abnormal results of liver function studies: Secondary | ICD-10-CM | POA: Diagnosis not present

## 2016-01-03 DIAGNOSIS — Z7982 Long term (current) use of aspirin: Secondary | ICD-10-CM | POA: Diagnosis not present

## 2016-01-03 DIAGNOSIS — Z01419 Encounter for gynecological examination (general) (routine) without abnormal findings: Secondary | ICD-10-CM | POA: Diagnosis not present

## 2016-01-03 DIAGNOSIS — Z9114 Patient's other noncompliance with medication regimen: Secondary | ICD-10-CM | POA: Diagnosis not present

## 2016-01-03 DIAGNOSIS — Z113 Encounter for screening for infections with a predominantly sexual mode of transmission: Secondary | ICD-10-CM | POA: Diagnosis not present

## 2016-01-03 DIAGNOSIS — N814 Uterovaginal prolapse, unspecified: Secondary | ICD-10-CM | POA: Diagnosis not present

## 2016-01-03 MED ORDER — NYSTATIN 100000 UNIT/GM EX CREA: 1.0000 "application " | TOPICAL_CREAM | Freq: Two times a day (BID) | CUTANEOUS | Status: DC

## 2016-01-03 MED FILL — NYSTATIN 100,000 UNIT/GM CR: 100000 | 15 days supply | Qty: 30 | Fill #0

## 2016-01-03 NOTE — Progress Notes (Signed)
Patient ID: Alicia [name], female   DOB: Jan 27, 1956, 60 y.o.   MRN: [id number]  CC: pap/breast exam  HPI: Alicia [name] is a 60 y.o. female here today for a follow up visit.  Patient has past medical history of uncontrolled diabetes, HTN, CVA, cardiomyopathy, and medication non-compliance. Patient presents for a pap smear and breast exam. She reports that she has experienced vaginal itching on her outer labia for the past 1 week. She denies vaginal discharge, odor, or lesions. She has no complaints today.   Allergies  Allergen Reactions  . Other Other (See Comments)    Told not to take any dairy products-per MD   Past Medical History  Diagnosis Date  . Diabetes mellitus, type II ([place])   . Hypertension   . H/O: CVA (cerebrovascular accident)     6 strokes, most recent on 05/10/13  . Chronic systolic dysfunction of left ventricle   . NICM (nonischemic cardiomyopathy) ([place])     Echo (05/2014):  Mod LVH, EF 20-25%, inf HK, mild MR, mild LAE.  . Tobacco abuse   . Noncompliance    Current Outpatient Prescriptions on File Prior to Visit  Medication Sig Dispense Refill  . amLODipine (NORVASC) 10 MG tablet Take 1 tablet (10 mg total) by mouth daily. 90 tablet 3  . aspirin 81 MG tablet Take 81 mg by mouth daily.    Marland Kitchen atorvastatin (LIPITOR) 40 MG tablet Take 1 tablet (40 mg total) by mouth daily. 90 tablet 3  . carvedilol (COREG) 12.5 MG tablet Take 1 tablet (12.5 mg total) by mouth 2 (two) times daily. 180 tablet 3  . furosemide (LASIX) 40 MG tablet Take 1.5 tablets (60 mg total) by mouth daily. 1.5 mg daily 45 tablet 2  . glimepiride (AMARYL) 4 MG tablet Take 1 tablet (4 mg total) by mouth daily before breakfast. 30 tablet 3  . glucose blood (ACCU-CHEK AVIVA PLUS) test strip USE TO TEST BLOOD SUGARS AS DIRECTED BY PHYSICIAN 100 each 12  . Insulin Glargine (LANTUS) 100 UNIT/ML Solostar Pen Inject 40 Units into the skin daily at 10 pm. 15 mL 3  . insulin lispro (HUMALOG) 100 UNIT/ML KiwkPen  Inject 0.16 mLs (16 Units total) into the skin 3 (three) times daily. Administer subQ within 15 minutes before or immediately after a meal 15 mL 3  . lisinopril (PRINIVIL,ZESTRIL) 20 MG tablet Take 1 tablet (20 mg total) by mouth daily. 90 tablet 3  . polyethylene glycol (MIRALAX / GLYCOLAX) packet Take 17 g by mouth daily as needed for mild constipation. 14 each 0  . TRUEPLUS LANCETS 28G MISC USE TO TEST BLOOD SUGARS AS DIRECTED BY PHYSICIAN 100 each 12  . ULTICARE MICRO PEN NEEDLES 32G X 4 MM MISC USE 4 TIMES DAILY TO INJECT INSULIN 200 each 12   No current facility-administered medications on file prior to visit.   Family History  Problem Relation Age of Onset  . Diabetes Mellitus II Mother   . Hypertension Mother   . CVA Mother   . Heart failure Brother   . Heart attack Neg Hx   . Stroke Mother    Social History   Social History  . Marital Status: Widowed    Spouse Name: N/A  . Number of Children: N/A  . Years of Education: N/A   Occupational History  . Not on file.   Social History Main Topics  . Smoking status: Current Every Day Smoker -- 0.50 packs/day for 25 years    Types:  Cigarettes  . Smokeless tobacco: Never Used     Comment: smoking 1 ppd  . Alcohol Use: No  . Drug Use: No  . Sexual Activity: Yes   Other Topics Concern  . Not on file   Social History Narrative   Works in [employer] fund[employer], currently disabled    Review of Systems: Constitutional: Negative for fever, chills, diaphoresis, activity change, appetite change and fatigue. HENT: Negative for ear pain, nosebleeds, congestion, facial swelling, rhinorrhea, neck pain, neck stiffness and ear discharge.  Eyes: Negative for pain, discharge, redness, itching and visual disturbance. Respiratory: Negative for cough, choking, chest tightness, shortness of breath, wheezing and stridor.  Cardiovascular: Negative for chest pain, palpitations and leg swelling. Gastrointestinal: Negative for abdominal  distention. Genitourinary: Negative for dysuria, urgency, frequency, hematuria, flank pain, decreased urine volume, difficulty urinating and dyspareunia.  Musculoskeletal: Negative for back pain, joint swelling, arthralgias and gait problem. Neurological: Negative for dizziness, tremors, seizures, syncope, facial asymmetry, speech difficulty, weakness, light-headedness, numbness and headaches.  Hematological: Negative for adenopathy. Does not bruise/bleed easily. Psychiatric/Behavioral: Negative for hallucinations, behavioral problems, confusion, dysphoric mood, decreased concentration and agitation.    Objective:   Filed Vitals:   01/03/16 1043  BP: 152/85  Pulse: 59  Temp: 98 F (36.7 C)  Resp: 16    Physical Exam  Abdominal: She exhibits no distension. There is no tenderness.  Genitourinary: Uterus normal. Cervix exhibits no motion tenderness, no discharge and no friability. Right adnexum displays no tenderness. Left adnexum displays no tenderness. No vaginal discharge found.  Severe uterine prolapse  Lymphadenopathy:       Right: No inguinal adenopathy present.       Left: No inguinal adenopathy present.  Psychiatric: She has a normal mood and affect.     Lab Results  Component Value Date   WBC 7.0 08/28/2015   HGB 13.1 08/28/2015   HCT 38.5 08/28/2015   MCV 79.7 08/28/2015   PLT 269 08/28/2015   Lab Results  Component Value Date   CREATININE 0.63 08/28/2015   BUN 15 08/28/2015   NA 138 08/28/2015   K 4.1 08/28/2015   CL 101 08/28/2015   CO2 27 08/28/2015    Lab Results  Component Value Date   HGBA1C 12.0 11/11/2015   Lipid Panel     Component Value Date/Time   CHOL 159 08/28/2015 0926   TRIG 93 08/28/2015 0926   HDL 50 08/28/2015 0926   CHOLHDL 3.2 08/28/2015 0926   VLDL 19 08/28/2015 0926   LDLCALC 90 08/28/2015 0926       Assessment and plan:   Alicia [name] was seen today for gynecologic exam.  Diagnoses and all orders for this  visit:  Papanicolaou smear for cervical cancer screening -     Cytology - PAP [place] -     HIV antibody (with reflex) -     Hepatitis panel, acute  Uterine prolapse -     Ambulatory referral to Gynecology She denies pain currently. States that she has had a major abdominal surgery several years ago and is concerned that may have caused her prolapse. I will defer treatment to GYN.  Vaginal candida -     nystatin cream (MYCOSTATIN); Apply 1 application topically 2 (two) times daily. Only apply to outer area Stressed strict glycemic control      Return in about 6 weeks (around 02/14/2016) for Diabetes Mellitus.   Lance Bosch, [facility] [phone] 01/03/2016, 10:56 AM

## 2016-01-03 NOTE — Progress Notes (Signed)
Patient here for her pap and breast exam

## 2016-01-04 LAB — HIV ANTIBODY (ROUTINE TESTING W REFLEX): HIV: NONREACTIVE

## 2016-01-04 LAB — HEPATITIS PANEL, ACUTE
HCV AB: NEGATIVE
HEP B C IGM: NONREACTIVE
HEP B S AG: NEGATIVE
Hep A IgM: NONREACTIVE

## 2016-01-06 ENCOUNTER — Telehealth: Payer: Self-pay

## 2016-01-06 ENCOUNTER — Other Ambulatory Visit: Payer: Self-pay | Admitting: Internal Medicine

## 2016-01-06 DIAGNOSIS — Z1231 Encounter for screening mammogram for malignant neoplasm of breast: Secondary | ICD-10-CM

## 2016-01-06 LAB — CERVICOVAGINAL ANCILLARY ONLY
Chlamydia: NEGATIVE
Neisseria Gonorrhea: NEGATIVE
Wet Prep (BD Affirm): POSITIVE — AB

## 2016-01-06 LAB — CYTOLOGY - PAP

## 2016-01-06 MED ORDER — METRONIDAZOLE 500 MG PO TABS: 500.0000 mg | ORAL_TABLET | Freq: Two times a day (BID) | ORAL | Status: DC

## 2016-01-06 MED FILL — metroNIDAZOLE 500 MG TABS: 500 | 7 days supply | Qty: 14 | Fill #0

## 2016-01-06 NOTE — Telephone Encounter (Signed)
Called patient this am Patient not available Message left on voice mail to return our call 

## 2016-01-06 NOTE — Telephone Encounter (Signed)
-----   Message from Lance Bosch, NP sent at 01/06/2016  8:43 AM EST ----- Negative HIV and Hepatitis

## 2016-01-06 NOTE — Telephone Encounter (Signed)
Patient returned phone call and is aware of her lab results 

## 2016-01-06 NOTE — Telephone Encounter (Signed)
Tried to contact patient Patient not available Message left on voice mail to return our call 

## 2016-01-06 NOTE — Telephone Encounter (Signed)
-----   Message from Lance Bosch, NP sent at 01/06/2016  5:13 PM EST ----- Patient positive for Bacterial Vaginosis . Please explain this is not a STD, but a imbalance of the vaginal pH. Please send Flagyl 500 mg BID for 7 days. No refills, no alcohol while on this medication.

## 2016-01-07 ENCOUNTER — Encounter: Payer: Self-pay | Admitting: Obstetrics & Gynecology

## 2016-01-07 ENCOUNTER — Telehealth: Payer: Self-pay

## 2016-01-07 ENCOUNTER — Other Ambulatory Visit: Payer: Self-pay | Admitting: Cardiology

## 2016-01-07 NOTE — Telephone Encounter (Signed)
Patient returned phone call and aware of her positive results for BV And will pick up her medication later today

## 2016-01-08 ENCOUNTER — Telehealth: Payer: Self-pay

## 2016-01-08 NOTE — Telephone Encounter (Signed)
-----   Message from Lance Bosch, NP sent at 01/07/2016  4:41 PM EST ----- Normal cytology. Will repeat in 3 years

## 2016-01-08 NOTE — Telephone Encounter (Signed)
Tried to contact patient to give her lab results Patient not available Message left on voice mail to return our call

## 2016-01-23 MED FILL — FUROSEMIDE 40 MG TABLET: 40 | 30 days supply | Qty: 45 | Fill #0

## 2016-01-23 MED FILL — GLIMEPIRIDE 4 MG TABLET: 4 | 30 days supply | Qty: 30 | Fill #2

## 2016-01-23 MED FILL — AMLODIPINE BESYLATE 10 MG T: 10 | 30 days supply | Qty: 30 | Fill #3

## 2016-01-23 MED FILL — ATORVASTATIN 40 MG TABLET: 40 | 30 days supply | Qty: 30 | Fill #5

## 2016-01-23 MED FILL — LISINOPRIL 20 MG TABLET: 20 | 30 days supply | Qty: 30 | Fill #1

## 2016-01-24 MED FILL — HUMALOG 100 UNITS/ML KWIKPE: 100 | 30 days supply | Qty: 15 | Fill #2

## 2016-02-12 ENCOUNTER — Encounter: Payer: Self-pay | Admitting: Obstetrics & Gynecology

## 2016-02-12 ENCOUNTER — Ambulatory Visit (INDEPENDENT_AMBULATORY_CARE_PROVIDER_SITE_OTHER): Payer: Medicare Other | Admitting: Obstetrics & Gynecology

## 2016-02-12 ENCOUNTER — Ambulatory Visit
Admission: RE | Admit: 2016-02-12 | Discharge: 2016-02-12 | Disposition: A | Discharge status: Home / Self Care | Payer: Medicare Other | Source: Ambulatory Visit | Attending: Internal Medicine | Admitting: Internal Medicine

## 2016-02-12 VITALS — BP 154/62 | HR 96 | Temp 98.1°F | Resp 20 | Ht 67.0 in | Wt 193.4 lb

## 2016-02-12 DIAGNOSIS — Z1231 Encounter for screening mammogram for malignant neoplasm of breast: Secondary | ICD-10-CM

## 2016-02-12 DIAGNOSIS — N812 Incomplete uterovaginal prolapse: Secondary | ICD-10-CM

## 2016-02-12 DIAGNOSIS — N8110 Cystocele, unspecified: Secondary | ICD-10-CM

## 2016-02-12 DIAGNOSIS — N814 Uterovaginal prolapse, unspecified: Secondary | ICD-10-CM

## 2016-02-12 DIAGNOSIS — IMO0002 Reserved for concepts with insufficient information to code with codable children: Principal | ICD-10-CM

## 2016-02-12 DIAGNOSIS — IMO0001 Reserved for inherently not codable concepts without codable children: Secondary | ICD-10-CM

## 2016-02-12 HISTORY — DX: Uterovaginal prolapse, unspecified: N81.4

## 2016-02-12 NOTE — Progress Notes (Signed)
   Subjective:    Patient ID: Alicia [name], female    DOB: 1956-05-18, 60 y.o.   MRN: [id number]  HPI  60 yo S AA {3- largest baby 58#13 ounces) here with the issue of vaginal discomfort and difficulty voiding due to fairly long standing bladder prolapse. She cannot void in a normal position, has to push on her back and cock her hip up off the seat.  Review of Systems She is not sexually active.    Objective:   Physical Exam [id number] Breathing, conversing, and ambulating normally (in spite of "6 strokes") She has a Grade 3 cystocele and grade 2 uterine prolapse. I fitted her with a #5 ring pessary which relieved her symptoms and caused her no discomfort. She was able to remove and replace it without difficulty.  She was able to void in a normal position Phebe Colla!)       Assessment & Plan:  Symptomatic cystocele- pessary RTC 4 weeks for pessary check

## 2016-02-13 ENCOUNTER — Telehealth: Payer: Self-pay

## 2016-02-13 ENCOUNTER — Ambulatory Visit: Payer: Medicare Other | Attending: Internal Medicine | Admitting: Internal Medicine

## 2016-02-13 ENCOUNTER — Encounter: Payer: Self-pay | Admitting: Internal Medicine

## 2016-02-13 VITALS — BP 158/72 | HR 68 | Temp 98.7°F | Resp 16 | Ht 67.0 in | Wt 192.0 lb

## 2016-02-13 DIAGNOSIS — Z9114 Patient's other noncompliance with medication regimen: Secondary | ICD-10-CM | POA: Diagnosis not present

## 2016-02-13 DIAGNOSIS — E1165 Type 2 diabetes mellitus with hyperglycemia: Secondary | ICD-10-CM | POA: Insufficient documentation

## 2016-02-13 DIAGNOSIS — E119 Type 2 diabetes mellitus without complications: Secondary | ICD-10-CM | POA: Diagnosis not present

## 2016-02-13 DIAGNOSIS — I509 Heart failure, unspecified: Secondary | ICD-10-CM | POA: Insufficient documentation

## 2016-02-13 DIAGNOSIS — F17200 Nicotine dependence, unspecified, uncomplicated: Secondary | ICD-10-CM

## 2016-02-13 DIAGNOSIS — I10 Essential (primary) hypertension: Secondary | ICD-10-CM | POA: Diagnosis not present

## 2016-02-13 DIAGNOSIS — Z7982 Long term (current) use of aspirin: Secondary | ICD-10-CM | POA: Insufficient documentation

## 2016-02-13 DIAGNOSIS — Z794 Long term (current) use of insulin: Secondary | ICD-10-CM | POA: Diagnosis not present

## 2016-02-13 DIAGNOSIS — F17210 Nicotine dependence, cigarettes, uncomplicated: Secondary | ICD-10-CM | POA: Diagnosis not present

## 2016-02-13 DIAGNOSIS — Z79899 Other long term (current) drug therapy: Secondary | ICD-10-CM | POA: Diagnosis not present

## 2016-02-13 LAB — GLUCOSE, POCT (MANUAL RESULT ENTRY)
POC GLUCOSE: 279 mg/dL — AB (ref 70–99)
POC Glucose: 312 mg/dl — AB (ref 70–99)

## 2016-02-13 LAB — POCT GLYCOSYLATED HEMOGLOBIN (HGB A1C): HEMOGLOBIN A1C: 14.4

## 2016-02-13 MED ORDER — INSULIN LISPRO 100 UNIT/ML (KWIKPEN): 16.0000 [IU] | PEN_INJECTOR | Freq: Three times a day (TID) | SUBCUTANEOUS | Status: DC

## 2016-02-13 MED ORDER — GLIMEPIRIDE 4 MG PO TABS: 4.0000 mg | ORAL_TABLET | Freq: Every day | ORAL | Status: DC

## 2016-02-13 MED ORDER — INSULIN GLARGINE 100 UNIT/ML SOLOSTAR PEN: 40.0000 [IU] | PEN_INJECTOR | Freq: Every day | SUBCUTANEOUS | Status: DC

## 2016-02-13 NOTE — Addendum Note (Signed)
Addended by: Chari Manning A on: 02/13/2016 02:02 PM   Modules accepted: Orders

## 2016-02-13 NOTE — Progress Notes (Signed)
Patient's here for diabetes f/up. Patient reports feeling okay, denies any pain.  Patient states she has taken her meds this morning.

## 2016-02-13 NOTE — Progress Notes (Signed)
Patient ID: Alicia [name], female   DOB: 04/05/1956, 60 y.o.   MRN: [id number] SUBJECTIVE: 60 y.o. female for follow up of diabetes. Patient has a past medical history of CAD, HTN, heart failure, and tobacco use. Patient states that her 2 year old mother has been very sick and she has been stressed thinking about experiencing another death in her family. Since she has been so stressed she has been forgetting to take her Lantus and Humalog. She states that several nights per week she may forget to take her Lantus. She is not complaint with a diabetic diet.  She does continue to smoke several cigarettes per day and is not ready to quit.   Current Outpatient Prescriptions  Medication Sig Dispense Refill  . amLODipine (NORVASC) 10 MG tablet Take 1 tablet (10 mg total) by mouth daily. 90 tablet 3  . aspirin 81 MG tablet Take 81 mg by mouth daily.    Marland Kitchen atorvastatin (LIPITOR) 40 MG tablet Take 1 tablet (40 mg total) by mouth daily. 90 tablet 3  . carvedilol (COREG) 12.5 MG tablet Take 1 tablet (12.5 mg total) by mouth 2 (two) times daily. 180 tablet 3  . furosemide (LASIX) 40 MG tablet TAKE 1 AND 1/2 TABLETS BY MOUTH DAILY. 45 tablet 8  . glimepiride (AMARYL) 4 MG tablet Take 1 tablet (4 mg total) by mouth daily before breakfast. 30 tablet 3  . glucose blood (ACCU-CHEK AVIVA PLUS) test strip USE TO TEST BLOOD SUGARS AS DIRECTED BY PHYSICIAN 100 each 12  . Insulin Glargine (LANTUS) 100 UNIT/ML Solostar Pen Inject 40 Units into the skin daily at 10 pm. 15 mL 3  . insulin lispro (HUMALOG) 100 UNIT/ML KiwkPen Inject 0.16 mLs (16 Units total) into the skin 3 (three) times daily. Administer subQ within 15 minutes before or immediately after a meal 15 mL 3  . lisinopril (PRINIVIL,ZESTRIL) 20 MG tablet Take 1 tablet (20 mg total) by mouth daily. 90 tablet 3  . nystatin cream (MYCOSTATIN) Apply 1 application topically 2 (two) times daily. Only apply to outer area 30 g 1  . polyethylene glycol (MIRALAX / GLYCOLAX)  packet Take 17 g by mouth daily as needed for mild constipation. 14 each 0  . TRUEPLUS LANCETS 28G MISC USE TO TEST BLOOD SUGARS AS DIRECTED BY PHYSICIAN 100 each 12  . ULTICARE MICRO PEN NEEDLES 32G X 4 MM MISC USE 4 TIMES DAILY TO INJECT INSULIN 200 each 12   No current facility-administered medications for this visit.  Review of Systems  Genitourinary: Positive for frequency (Pessary placed yesterday).  Neurological: Negative for tingling and headaches.  All other systems reviewed and are negative.  OBJECTIVE: Appearance: alert, well appearing, and in no distress, oriented to person, place, and time and overweight. BP 158/72 mmHg  Pulse 68  Temp(Src) 98.7 F (37.1 C) (Oral)  Resp 16  Ht 5\' 7"  (1.702 m)  Wt 192 lb (87.091 kg)  BMI 30.06 kg/m2  SpO2 97%  Exam: heart sounds normal rate, regular rhythm, normal S1, S2, no murmurs, rubs, clicks or gallops, no JVD, chest clear, no carotid bruits  ASSESSMENT: Timberlyn was seen today for follow-up and diabetes.  Diagnoses and all orders for this visit:  Type 2 diabetes mellitus without complication, unspecified long term insulin use status (HCC) -     POCT glucose (manual entry) -     POCT glycosylated hemoglobin (Hb A1C) -     Glucose (CBG) -     glimepiride (AMARYL) 4  MG tablet; Take 1 tablet (4 mg total) by mouth daily before breakfast. -     Insulin Glargine (LANTUS) 100 UNIT/ML Solostar Pen; Inject 40 Units into the skin daily at 10 pm. -     insulin lispro (HUMALOG) 100 UNIT/ML KiwkPen; Inject 0.16 mLs (16 Units total) into the skin 3 (three) times daily. Administer subQ within 15 minutes before or immediately after a meal Patients diabetes remains uncontrolled as evidence by hemoglobin a1c >8.  Patient has been non-compliant with medication regimen. Stressed the multiple complications associated with uncontrolled diabetes.  Patient will stay on current medication dose and report back to clinic with cbg log in 2  weeks.  Noncompliance with medication regimen See above.  Tobacco use disorder Smoking cessation discussed for 3 minutes, patient is not willing to quit at this time. Will continue to assess on each visit. Discussed increased risk for diseases such as cancer, heart disease, and stroke.    PLAN: See orders for this visit as documented in the electronic medical record. Issues reviewed with her: diabetic diet discussed in detail, written exchange diet given, low cholesterol diet, weight control and daily exercise discussed, annual eye examinations at Ophthalmology discussed and long term diabetic complications discussed.  Return in about 2 weeks (around 02/27/2016) for [place]---log review and 3 mo PCP.  Lance Bosch, NP 02/13/2016 2:01 PM

## 2016-02-13 NOTE — Telephone Encounter (Signed)
Tried to contact patient about her results Patient not available Message left on voice mail to return our call

## 2016-02-13 NOTE — Telephone Encounter (Signed)
-----   Message from Lance Bosch, NP sent at 02/13/2016  1:42 PM EDT ----- Mammogram negative. Repeat in 1 year

## 2016-02-14 MED FILL — LANTUS SOLOSTAR 100 UNITS/M: 100 | 37 days supply | Qty: 15 | Fill #0

## 2016-02-18 MED FILL — CARVEDILOL 12.5 MG TABLET: 12.5 | 30 days supply | Qty: 60 | Fill #3

## 2016-02-18 MED FILL — GLIMEPIRIDE 4 MG TABLET: 4 | 30 days supply | Qty: 30 | Fill #3

## 2016-02-26 MED FILL — AMLODIPINE BESYLATE 10 MG T: 10 | 30 days supply | Qty: 30 | Fill #4

## 2016-02-26 MED FILL — LISINOPRIL 20 MG TABLET: 20 | 30 days supply | Qty: 30 | Fill #2

## 2016-02-26 MED FILL — FUROSEMIDE 40 MG TABLET: 40 | 30 days supply | Qty: 45 | Fill #1

## 2016-02-26 MED FILL — ATORVASTATIN 40 MG TABLET: 40 | 30 days supply | Qty: 30 | Fill #6

## 2016-02-27 ENCOUNTER — Ambulatory Visit: Payer: Medicare Other | Attending: Internal Medicine | Admitting: Pharmacist

## 2016-02-27 DIAGNOSIS — Z79899 Other long term (current) drug therapy: Secondary | ICD-10-CM | POA: Insufficient documentation

## 2016-02-27 DIAGNOSIS — Z794 Long term (current) use of insulin: Secondary | ICD-10-CM | POA: Insufficient documentation

## 2016-02-27 DIAGNOSIS — E119 Type 2 diabetes mellitus without complications: Secondary | ICD-10-CM

## 2016-02-27 NOTE — Patient Instructions (Signed)
Thanks for coming to see [name]!  You are doing AWESOME!!!!!!!!!!!!!!!!  Come back and see me in 1 month but sooner if you need me!  Hypoglycemia Low blood sugar (hypoglycemia) means that the level of sugar in your blood is lower than it should be. Signs of low blood sugar include:  Getting sweaty.  Feeling hungry.  Feeling dizzy or weak.  Feeling sleepier than normal.  Feeling nervous.  Headaches.  Having a fast heartbeat. Low blood sugar can happen fast and can be an emergency. Your doctor can do tests to check your blood sugar level. You can have low blood sugar and not have diabetes. HOME CARE  Check your blood sugar as told by your doctor. If it is less than 70 mg/dl or as told by your doctor, take 1 of the following:  3 to 4 glucose tablets.   cup clear juice.   cup soda pop, not diet.  1 cup milk.  5 to 6 hard candies.  Recheck blood sugar after 15 minutes. Repeat until it is at the right level.  Eat a snack if it is more than 1 hour until the next meal.  Only take medicine as told by your doctor.  Do not skip meals. Eat on time.  Do not drink alcohol except with meals.  Check your blood glucose before driving.  Check your blood glucose before and after exercise.  Always carry treatment with you, such as glucose pills.  Always wear a medical alert bracelet if you have diabetes. GET HELP RIGHT AWAY IF:   Your blood glucose goes below 70 mg/dl or as told by your doctor, and you:  Are confused.  Are not able to swallow.  Pass out (faint).  You cannot treat yourself. You may need someone to help you.  You have low blood sugar problems often.  You have problems from your medicines.  You are not feeling better after 3 to 4 days.  You have vision changes. MAKE SURE YOU:   Understand these instructions.  Will watch this condition.  Will get help right away if you are not doing well or get worse.   This information is not intended to  replace advice given to you by your health care provider. Make sure you discuss any questions you have with your health care provider.   Document Released: 02/03/2010 Document Revised: 11/30/2014 Document Reviewed: 07/16/2015 Elsevier Interactive Patient Education [employer].

## 2016-02-27 NOTE — Progress Notes (Signed)
S:    Patient arrives in good spirits.  Presents for diabetes evaluation, education, and management at the request of Chari Manning. Patient was referred on 02/13/16.  Patient was last seen by Primary Care Provider on 02/13/16.   Patient reports adherence with medications.  Current diabetes medications include: glimepiride 4 mg daily, Lantus 40 units daily, Humalog 16 units TID  Patient denies hypoglycemic events.  Patient reported dietary habits: patient reports cutting out sugary drinks and only using splenda to sweeten food or drinks.  Patient reported exercise habits: moving around more   Patient denies nocturia.  Patient denies neuropathy. Patient denies visual changes. Patient reports self foot exams.    O:  Lab Results  Component Value Date   HGBA1C 14.4 02/13/2016   There were no vitals filed for this visit.  Home fasting CBG: 89-120  2 hour post-prandial/random CBG: 110s-160s   A/P: Diabetes longstanding currently uncontrolled based on A1c of 14.4 but greatly improved control based on home CBGs. Patient denies hypoglycemic events and is able to verbalize appropriate hypoglycemia management plan. Patient reports adherence with medication.  Continued all medications as prescribed. Congratulated patient on progress and encouraged her to keep up the great work. Patient to follow up immediately if CBGs are in the 200s or <70. Patient verbalized understanding.   Next A1C anticipated June 2017.    Written patient instructions provided.  Total time in face to face counseling 20 minutes.   Follow up in Pharmacist Clinic Visit in 1 month.   Patient seen with Jamie Brookes, PharmD Candidate

## 2016-03-03 MED FILL — ULTICARE PEN NDL 4MM 32G: 32G X 4 MM | 25 days supply | Qty: 100 | Fill #1 | Status: TO

## 2016-03-03 MED FILL — HUMALOG 100 UNITS/ML KWIKPE: 100 | 30 days supply | Qty: 15 | Fill #3

## 2016-03-11 ENCOUNTER — Ambulatory Visit: Payer: Medicare Other | Admitting: Obstetrics & Gynecology

## 2016-03-26 ENCOUNTER — Ambulatory Visit: Payer: Medicaid Other | Attending: Internal Medicine | Admitting: Pharmacist

## 2016-03-26 DIAGNOSIS — E119 Type 2 diabetes mellitus without complications: Secondary | ICD-10-CM | POA: Insufficient documentation

## 2016-03-26 DIAGNOSIS — Z794 Long term (current) use of insulin: Secondary | ICD-10-CM | POA: Diagnosis not present

## 2016-03-26 MED ORDER — INSULIN LISPRO 100 UNIT/ML (KWIKPEN): 16.0000 [IU] | PEN_INJECTOR | Freq: Three times a day (TID) | SUBCUTANEOUS | Status: DC

## 2016-03-26 MED ORDER — GLIMEPIRIDE 4 MG PO TABS: 4.0000 mg | ORAL_TABLET | Freq: Every day | ORAL | Status: DC

## 2016-03-26 MED ORDER — INSULIN GLARGINE 100 UNIT/ML SOLOSTAR PEN: 40.0000 [IU] | PEN_INJECTOR | Freq: Every day | SUBCUTANEOUS | Status: DC

## 2016-03-26 MED ORDER — ATORVASTATIN CALCIUM 40 MG PO TABS: 40.0000 mg | ORAL_TABLET | Freq: Every day | ORAL | Status: DC

## 2016-03-26 MED FILL — ATORVASTATIN 40 MG TABLET: 40 | 30 days supply | Qty: 30 | Fill #0

## 2016-03-26 MED FILL — GLIMEPIRIDE 4 MG TABLET: 4 | 30 days supply | Qty: 30 | Fill #0

## 2016-03-26 MED FILL — LISINOPRIL 20 MG TABLET: 20 | 30 days supply | Qty: 30 | Fill #3

## 2016-03-26 MED FILL — AMLODIPINE BESYLATE 10 MG T: 10 | 30 days supply | Qty: 30 | Fill #5

## 2016-03-26 NOTE — Progress Notes (Signed)
S:    Patient arrives in good spirits.  Presents for diabetes evaluation, education, and management at the request of Chari Manning. Patient was referred on 02/13/16.  Patient was last seen by Primary Care Provider on 02/13/16.   Patient reports adherence with medications.  Current diabetes medications include: glimepiride 4 mg daily, Lantus 40 units daily, Humalog 16 units TID  Patient denies hypoglycemic events.  Patient reported dietary habits: patient reports continuing to watch her carbohydrate intake  Patient reported exercise habits: moving around more but exercise outside has been difficult due to allergies.   Patient denies nocturia.  Patient denies neuropathy. Patient denies visual changes. Patient reports self foot exams.    O:  Lab Results  Component Value Date   HGBA1C 14.4 02/13/2016   There were no vitals filed for this visit.  Home fasting CBG: 80s-150s 2 hour post-prandial/random CBG: 120s-210   A/P: Diabetes longstanding currently uncontrolled based on A1c of 14.4 but greatly improved control based on home CBGs. Patient denies hypoglycemic events and is able to verbalize appropriate hypoglycemia management plan. Patient reports adherence with medication.  Continued all medications as prescribed. Congratulated patient on progress and encouraged her to keep up the great work. Patient to follow up immediately if CBGs are consistently in the 200s or <70. Patient verbalized understanding.   Next A1C anticipated June 2017.    Written patient instructions provided.  Total time in face to face counseling 20 minutes.  Follow up in Pharmacist Clinic Visit in 1 month. Asked patient to schedule an appointment with a new primary care provider since Mateo Flow is gone.

## 2016-03-26 NOTE — Patient Instructions (Signed)
You are doing great Ms. Hosterman!!!  Keep up the awesome work!  Check in with me next month.  Schedule an appointment with a new primary care provider since Mateo Flow is gone

## 2016-03-30 NOTE — Addendum Note (Signed)
Addended by: Nicoletta Ba A on: 03/30/2016 04:51 PM   Modules accepted: Orders, Medications

## 2016-04-06 ENCOUNTER — Ambulatory Visit (INDEPENDENT_AMBULATORY_CARE_PROVIDER_SITE_OTHER): Payer: Medicare Other | Admitting: Cardiology

## 2016-04-06 ENCOUNTER — Encounter: Payer: Self-pay | Admitting: Cardiology

## 2016-04-06 VITALS — BP 132/80 | HR 76 | Ht 66.0 in | Wt 189.6 lb

## 2016-04-06 DIAGNOSIS — E785 Hyperlipidemia, unspecified: Secondary | ICD-10-CM | POA: Diagnosis not present

## 2016-04-06 DIAGNOSIS — F17200 Nicotine dependence, unspecified, uncomplicated: Secondary | ICD-10-CM | POA: Diagnosis not present

## 2016-04-06 DIAGNOSIS — I5022 Chronic systolic (congestive) heart failure: Secondary | ICD-10-CM

## 2016-04-06 DIAGNOSIS — I10 Essential (primary) hypertension: Secondary | ICD-10-CM | POA: Diagnosis not present

## 2016-04-06 DIAGNOSIS — I2510 Atherosclerotic heart disease of native coronary artery without angina pectoris: Secondary | ICD-10-CM

## 2016-04-06 NOTE — Patient Instructions (Addendum)

## 2016-04-06 NOTE — Progress Notes (Signed)
[facility]. [address], [place]  [address] Phone: [phone] Fax:  [phone]  Date:  04/06/2016   ID:  Alicia [name], DOB 23-Dec-1955, MRN [id number]  PCP:  No primary care provider on file.   History of Present Illness: Alicia [name] is a 60 y.o. female with a history of chronic systolic heart failure (NICM) ejection fraction ranging from 25-40%, nuclear stress test with no ischemia, diabetes, hypertension, prior stroke here for followup.   She's not having any symptoms of heart failure however.  No syncope, no retention of fluid. No chest pain.  Coreg BID  Her ventral hernia seems to be stable. Post surgery.  Trying to work on her diabetes.   Breathing up stairs (daughter thought it was horror move Environmental manager). Overall however she is feeling quite well, quite happy. No chest pain. No orthopnea. Her grandson also has skin condition that she has, hereditary with dark patches.  Studies:  - LHC (02/19/14): Mid RCA 30-40, distal RCA 60, EF 25%.  - Echo (05/11/13): Moderate LVH, EF 30-40%.  - Nuclear (07/23/09 - in [place]): No ischemia.  - Holter (03/2012): NSR, occasional PVCs, no significant arrhythmia  Recent Labs:  02/18/2014: Pro B Natriuretic peptide (BNP) 2876.0*; TSH 1.032  02/20/2014: Hemoglobin 10.9*  03/27/2014: ALT 13; HDL Cholesterol by NMR 49.50; LDL (calc) 135*  04/02/2014: Creatinine 0.5; Potassium 3.8      Wt Readings from Last 3 Encounters:  04/06/16 189 lb 9.6 oz (86.002 kg)  02/13/16 192 lb (87.091 kg)  02/12/16 193 lb 6.4 oz (87.726 kg)     Past Medical History  Diagnosis Date  . Diabetes mellitus, type II ([place])   . Hypertension   . H/O: CVA (cerebrovascular accident)     6 strokes, most recent on 05/10/13  . Chronic systolic dysfunction of left ventricle   . NICM (nonischemic cardiomyopathy) ([place])     Echo (05/2014):  Mod LVH, EF 20-25%, inf HK, mild MR, mild LAE.  . Tobacco abuse   . Noncompliance   . Hyperlipidemia      Past Surgical History  Procedure Laterality Date  . Tubal ligation    . Cardiac catheterization    . Laparotomy N/A 01/15/2014    Procedure: EXPLORATORY LAPAROTOMY SMALL BOWEL RESECTION;  Surgeon: Imogene Burn. Georgette Dover, MD;  Location: [facility];  Service: General;  Laterality: N/A;  . Lysis of adhesion N/A 01/15/2014    Procedure: LYSIS OF ADHESION;  Surgeon: Imogene Burn. Georgette Dover, MD;  Location: [facility];  Service: General;  Laterality: N/A;  . Left heart catheterization with coronary angiogram N/A 02/19/2014    Procedure: LEFT HEART CATHETERIZATION WITH CORONARY ANGIOGRAM;  Surgeon: Troy Sine, MD;  Location: [facility] CATH LAB;  Service: Cardiovascular;  Laterality: N/A;  . Hernia repair      Current Outpatient Prescriptions  Medication Sig Dispense Refill  . amLODipine (NORVASC) 10 MG tablet Take 1 tablet (10 mg total) by mouth daily. 90 tablet 3  . aspirin 81 MG tablet Take 81 mg by mouth daily.    Marland Kitchen atorvastatin (LIPITOR) 40 MG tablet Take 1 tablet (40 mg total) by mouth daily. 90 tablet 0  . carvedilol (COREG) 12.5 MG tablet Take 1 tablet (12.5 mg total) by mouth 2 (two) times daily. 180 tablet 3  . furosemide (LASIX) 40 MG tablet TAKE 1 AND 1/2 TABLETS BY MOUTH DAILY. 45 tablet 8  . glimepiride (AMARYL) 4 MG tablet Take 1 tablet (4 mg  total) by mouth daily before breakfast. 30 tablet 3  . glucose blood (ACCU-CHEK AVIVA PLUS) test strip USE TO TEST BLOOD SUGARS AS DIRECTED BY PHYSICIAN 100 each 12  . Insulin Glargine (LANTUS) 100 UNIT/ML Solostar Pen Inject 40 Units into the skin daily at 10 pm. 15 mL 3  . insulin lispro (HUMALOG) 100 UNIT/ML KiwkPen Inject 0.16 mLs (16 Units total) into the skin 3 (three) times daily. Administer subQ within 15 minutes before or immediately after a meal 15 mL 3  . lisinopril (PRINIVIL,ZESTRIL) 20 MG tablet Take 1 tablet (20 mg total) by mouth daily. 90 tablet 3  . TRUEPLUS LANCETS 28G MISC USE TO TEST BLOOD SUGARS AS DIRECTED BY PHYSICIAN 100 each 12  . ULTICARE  MICRO PEN NEEDLES 32G X 4 MM MISC USE 4 TIMES DAILY TO INJECT INSULIN 200 each 12   No current facility-administered medications for this visit.    Allergies:    Allergies  Allergen Reactions  . Other Other (See Comments)    Told not to take any dairy products-per MD    Social History:  The patient  reports that she has been smoking Cigarettes.  She has a 12.5 pack-year smoking history. She has never used smokeless tobacco. She reports that she does not drink alcohol or use illicit drugs.   Family History  Problem Relation Age of Onset  . Diabetes Mellitus II Mother   . Hypertension Mother   . CVA Mother   . Stroke Mother   . Heart failure Brother   . Heart attack Neg Hx     ROS:  Please see the history of present illness.   No syncope, no orthopnea, no chest pain, no PND   All other systems reviewed and negative.   PHYSICAL EXAM: VS:  BP 132/80 mmHg  Pulse 76  Ht 5\' 6"  (1.676 m)  Wt 189 lb 9.6 oz (86.002 kg)  BMI 30.62 kg/m2 Well nourished, well developed, in no acute distress HEENT: normal, [place]/AT, EOMI Neck: no JVD, normal carotid upstroke, no bruit Cardiac:  normal S1, S2; RRR; no murmur Lungs:  clear to auscultation bilaterally, no wheezing, rhonchi or rales Abd: soft, nontender, no hepatomegaly, no bruitsAbdominal scar Ext: no edema, difficult distal pulses Skin: warm and dryDark patches noted on elbows as well as Achilles region. GU: deferred Neuro: no focal abnormalities noted, AAO x 3  EKG:  Prior 09/30/15-sinus rhythm, 62, poor R-wave progression, T-wave inversion lateral leads, left anterior fascicular block. Personally viewed     ASSESSMENT AND PLAN:  1. Chronic systolic heart failure-nonischemic. Her blood pressure is well controlled today. She is taking her medications. Reviewed as above. Beta blocker, ACE inhibitor.  2. Hypertension-controlled today. Continue to encourage medication compliance. 3. Hyperlipidemia-atorvastatin 40. LDL 90 less than  100. 4. Coronary artery disease-nonobstructive disease. Continue with secondary prevention. Statin. 5. Prior ventral hernia repair- strangulation. 2/15 6. Diabetes-hemoglobin A1c 13-she was working hard with Dr. Feliciana Rossetti but is upset that she had to leave for [facility]. Have given her a list of primary care offices at may be a possibility for her.. 7. Tobacco use - cessation. Discussed. 8. 6 month followup  9. Appreciate Dr. Rayann Heman consultation. They discussed ICD, no ICD.  Signed, Candee Furbish, MD [facility]  04/06/2016 9:29 AM

## 2016-04-10 ENCOUNTER — Other Ambulatory Visit: Payer: Self-pay | Admitting: Internal Medicine

## 2016-04-10 MED ORDER — GLUCOSE BLOOD VI STRP: ORAL_STRIP | Status: DC

## 2016-04-10 MED FILL — HUMALOG 100 UNITS/ML KWIKPE: 100 | 30 days supply | Qty: 15 | Fill #0

## 2016-04-21 MED FILL — AMLODIPINE BESYLATE 10 MG T: 10 | 30 days supply | Qty: 30 | Fill #6

## 2016-04-21 MED FILL — LISINOPRIL 20 MG TABLET: 20 | 30 days supply | Qty: 30 | Fill #4

## 2016-04-21 MED FILL — FUROSEMIDE 40 MG TABLET: 40 | 30 days supply | Qty: 45 | Fill #2

## 2016-04-21 MED FILL — ATORVASTATIN 40 MG TABLET: 40 | 30 days supply | Qty: 30 | Fill #1

## 2016-04-21 MED FILL — CARVEDILOL 12.5 MG TABLET: 12.5 | 30 days supply | Qty: 60 | Fill #4

## 2016-04-21 MED FILL — GLIMEPIRIDE 4 MG TABLET: 4 | 30 days supply | Qty: 30 | Fill #1

## 2016-05-11 MED FILL — LANTUS SOLOSTAR 100 UNITS/M: 100 | 37 days supply | Qty: 15 | Fill #1

## 2016-05-11 MED FILL — HUMALOG 100 UNITS/ML KWIKPE: 100 | 30 days supply | Qty: 15 | Fill #1

## 2016-05-12 ENCOUNTER — Ambulatory Visit: Payer: Medicare Other | Attending: Internal Medicine | Admitting: Pharmacist

## 2016-05-12 ENCOUNTER — Encounter: Payer: Self-pay | Admitting: Pharmacist

## 2016-05-12 DIAGNOSIS — E119 Type 2 diabetes mellitus without complications: Secondary | ICD-10-CM | POA: Diagnosis not present

## 2016-05-12 MED ORDER — INSULIN LISPRO 100 UNIT/ML (KWIKPEN): 18.0000 [IU] | PEN_INJECTOR | Freq: Three times a day (TID) | SUBCUTANEOUS | Status: DC

## 2016-05-12 NOTE — Patient Instructions (Signed)
Thanks for coming to see me!  Increase Humalog to 18 units before each meal.  Schedule an appointment with a new primary care provider since Mateo Flow is gone.

## 2016-05-12 NOTE — Progress Notes (Signed)
S:    Patient arrives in good spirits.  Presents for diabetes evaluation, education, and management at the request of Chari Manning. Patient was referred on 02/13/16.  Patient was last seen by Primary Care Provider on 02/13/16.   Patient reports adherence with medications.  Current diabetes medications include: glimepiride 4 mg daily, Lantus 40 units daily, Humalog 16 units TID  Patient denies hypoglycemic events.  Patient reported dietary habits: patient reports continuing to watch her carbohydrate intake.  Patient reported exercise habits: moving around more and trying to play more with her dog.   Patient denies nocturia.  Patient denies neuropathy. Patient denies visual changes. Patient reports self foot exams.    O:  Lab Results  Component Value Date   HGBA1C 14.4 02/13/2016   There were no vitals filed for this visit.  Home fasting CBG: 88-150s 2 hour post-prandial/random CBG: 150s-250   A/P: Diabetes longstanding currently uncontrolled based on A1c of 14.4 but greatly improved control based on home CBGs. Patient denies hypoglycemic events and is able to verbalize appropriate hypoglycemia management plan. Patient reports adherence with medication.  Increased Humalog to 18 units with meals to hopefully improve post-prandial readings. Continued all other medications. Congratulated patient on progress and encouraged her to keep up the work. Patient to follow up immediately if CBGs are consistently in the 200s or <70. Patient verbalized understanding.   Next A1C anticipated June 2017 (too early for insurance to cover right now).    Written patient instructions provided.  Total time in face to face counseling 20 minutes.  Follow up in Pharmacist Clinic Visit in 1 month. Asked patient to schedule an appointment with a new primary care provider since Mateo Flow is gone. Patient needs to see new provider before her next visit with me.

## 2016-05-27 MED FILL — ATORVASTATIN 40 MG TABLET: 40 | 30 days supply | Qty: 30 | Fill #2

## 2016-05-27 MED FILL — LISINOPRIL 20 MG TABLET: 20 | 30 days supply | Qty: 30 | Fill #5

## 2016-05-27 MED FILL — AMLODIPINE BESYLATE 10 MG T: 10 | 30 days supply | Qty: 30 | Fill #7

## 2016-05-27 MED FILL — GLIMEPIRIDE 4 MG TABLET: 4 | 30 days supply | Qty: 30 | Fill #2

## 2016-06-16 MED FILL — HUMALOG 100 UNITS/ML KWIKPE: 100 | 30 days supply | Qty: 15 | Fill #2

## 2016-06-16 MED FILL — CARVEDILOL 12.5 MG TABLET: 12.5 | 30 days supply | Qty: 60 | Fill #5

## 2016-06-16 MED FILL — LANTUS SOLOSTAR 100 UNITS/M: 100 | 37 days supply | Qty: 15 | Fill #2

## 2016-06-25 MED FILL — AMLODIPINE BESYLATE 10 MG T: 10 | 30 days supply | Qty: 30 | Fill #8

## 2016-06-25 MED FILL — ATORVASTATIN 40 MG TABLET: 40 | 30 days supply | Qty: 30 | Fill #3

## 2016-06-25 MED FILL — GLIMEPIRIDE 4 MG TABLET: 4 | 30 days supply | Qty: 30 | Fill #3

## 2016-06-25 MED FILL — LISINOPRIL 20 MG TABLET: 20 | 30 days supply | Qty: 30 | Fill #6

## 2016-07-14 DIAGNOSIS — E119 Type 2 diabetes mellitus without complications: Secondary | ICD-10-CM | POA: Diagnosis not present

## 2016-07-14 DIAGNOSIS — L03311 Cellulitis of abdominal wall: Secondary | ICD-10-CM | POA: Diagnosis not present

## 2016-07-14 MED FILL — SULFAMETHOXAZOLE-TMP DS TAB: 800-160 | 7 days supply | Qty: 14 | Fill #0

## 2016-07-14 MED FILL — METFORMIN HCL ER 500 MG TAB: 500 | 30 days supply | Qty: 60 | Fill #0

## 2016-07-20 MED FILL — AMLODIPINE BESYLATE 10 MG T: 10 | 30 days supply | Qty: 30 | Fill #9

## 2016-07-20 MED FILL — LANTUS SOLOSTAR 100 UNITS/M: 100 | 37 days supply | Qty: 15 | Fill #3

## 2016-07-20 MED FILL — HUMALOG 100 UNITS/ML KWIKPE: 100 | 30 days supply | Qty: 15 | Fill #3

## 2016-07-20 MED FILL — GLIMEPIRIDE 4 MG TABLET: 4 | 30 days supply | Qty: 30 | Fill #0

## 2016-07-20 MED FILL — FUROSEMIDE 20 MG TABLET: 20 | 30 days supply | Qty: 90 | Fill #0

## 2016-07-20 MED FILL — LISINOPRIL 20 MG TABLET: 20 | 30 days supply | Qty: 30 | Fill #7

## 2016-07-21 DIAGNOSIS — E7800 Pure hypercholesterolemia, unspecified: Secondary | ICD-10-CM | POA: Diagnosis not present

## 2016-07-21 DIAGNOSIS — E119 Type 2 diabetes mellitus without complications: Secondary | ICD-10-CM | POA: Diagnosis not present

## 2016-07-21 DIAGNOSIS — L03311 Cellulitis of abdominal wall: Secondary | ICD-10-CM | POA: Diagnosis not present

## 2016-07-21 DIAGNOSIS — I10 Essential (primary) hypertension: Secondary | ICD-10-CM | POA: Diagnosis not present

## 2016-08-03 MED FILL — ATORVASTATIN 40 MG TABLET: 40 | 30 days supply | Qty: 30 | Fill #4

## 2016-08-10 MED FILL — !HUMALOG 100 UNITS/ML KWIKP: 100 | 5 days supply | Qty: 3 | Fill #0

## 2016-08-10 MED FILL — METFORMIN HCL ER 500 MG TAB: 500 | 30 days supply | Qty: 60 | Fill #1

## 2016-08-19 ENCOUNTER — Other Ambulatory Visit: Payer: Self-pay | Admitting: Cardiology

## 2016-08-19 MED FILL — AMLODIPINE BESYLATE 10 MG T: 10 | 30 days supply | Qty: 30 | Fill #10

## 2016-08-19 MED FILL — CARVEDILOL 12.5 MG TABLET: 12.5 | 30 days supply | Qty: 60 | Fill #0

## 2016-08-19 MED FILL — LISINOPRIL 20 MG TABLET: 20 | 30 days supply | Qty: 30 | Fill #8

## 2016-08-19 MED FILL — GLIMEPIRIDE 4 MG TABLET: 4 | 30 days supply | Qty: 30 | Fill #1

## 2016-08-21 DIAGNOSIS — E119 Type 2 diabetes mellitus without complications: Secondary | ICD-10-CM | POA: Diagnosis not present

## 2016-08-21 DIAGNOSIS — Z23 Encounter for immunization: Secondary | ICD-10-CM | POA: Diagnosis not present

## 2016-08-21 DIAGNOSIS — I10 Essential (primary) hypertension: Secondary | ICD-10-CM | POA: Diagnosis not present

## 2016-08-21 DIAGNOSIS — E7800 Pure hypercholesterolemia, unspecified: Secondary | ICD-10-CM | POA: Diagnosis not present

## 2016-08-24 MED FILL — LANTUS SOLOSTAR 100 UNITS/M: 100 | 30 days supply | Qty: 12 | Fill #0

## 2016-08-24 MED FILL — METFORMIN HCL ER 500 MG TAB: 500 | 90 days supply | Qty: 360 | Fill #0

## 2016-08-27 MED FILL — HUMALOG 100 UNITS/ML KWIKPE: 100 | 30 days supply | Qty: 15 | Fill #0

## 2016-09-28 MED FILL — AMLODIPINE BESYLATE 10 MG T: 10 | 30 days supply | Qty: 30 | Fill #11

## 2016-09-28 MED FILL — GLIMEPIRIDE 4 MG TABLET: 4 | 30 days supply | Qty: 30 | Fill #2

## 2016-09-28 MED FILL — ATORVASTATIN 40 MG TABLET: 40 | 30 days supply | Qty: 30 | Fill #5

## 2016-09-28 MED FILL — LISINOPRIL 20 MG TABLET: 20 | 30 days supply | Qty: 30 | Fill #9

## 2016-09-28 MED FILL — CARVEDILOL 12.5 MG TABLET: 12.5 | 30 days supply | Qty: 60 | Fill #1

## 2016-09-29 DIAGNOSIS — E119 Type 2 diabetes mellitus without complications: Secondary | ICD-10-CM | POA: Diagnosis not present

## 2016-09-29 DIAGNOSIS — R0602 Shortness of breath: Secondary | ICD-10-CM | POA: Diagnosis not present

## 2016-09-29 DIAGNOSIS — J45909 Unspecified asthma, uncomplicated: Secondary | ICD-10-CM | POA: Diagnosis not present

## 2016-09-29 DIAGNOSIS — J069 Acute upper respiratory infection, unspecified: Secondary | ICD-10-CM | POA: Diagnosis not present

## 2016-09-30 MED FILL — HUMALOG 100 UNITS/ML KWIKPE: 100 | 30 days supply | Qty: 15 | Fill #1

## 2016-10-06 ENCOUNTER — Ambulatory Visit (INDEPENDENT_AMBULATORY_CARE_PROVIDER_SITE_OTHER): Payer: Medicare Other | Admitting: Cardiology

## 2016-10-06 ENCOUNTER — Encounter: Payer: Self-pay | Admitting: Cardiology

## 2016-10-06 DIAGNOSIS — R42 Dizziness and giddiness: Secondary | ICD-10-CM | POA: Diagnosis not present

## 2016-10-06 DIAGNOSIS — I2510 Atherosclerotic heart disease of native coronary artery without angina pectoris: Secondary | ICD-10-CM

## 2016-10-06 DIAGNOSIS — I10 Essential (primary) hypertension: Secondary | ICD-10-CM | POA: Diagnosis not present

## 2016-10-06 DIAGNOSIS — I5022 Chronic systolic (congestive) heart failure: Secondary | ICD-10-CM | POA: Diagnosis not present

## 2016-10-06 DIAGNOSIS — M791 Myalgia, unspecified site: Secondary | ICD-10-CM

## 2016-10-06 MED ORDER — FUROSEMIDE 40 MG PO TABS: 40.0000 mg | ORAL_TABLET | Freq: Every day | ORAL | 3 refills | Status: DC

## 2016-10-06 MED ORDER — ATORVASTATIN CALCIUM 40 MG PO TABS: 40.0000 mg | ORAL_TABLET | Freq: Every day | ORAL | 3 refills | Status: DC

## 2016-10-06 MED ORDER — LISINOPRIL 20 MG PO TABS: 20.0000 mg | ORAL_TABLET | Freq: Every day | ORAL | 3 refills | Status: DC

## 2016-10-06 MED ORDER — AMLODIPINE BESYLATE 10 MG PO TABS: 10.0000 mg | ORAL_TABLET | Freq: Every day | ORAL | 3 refills | Status: DC

## 2016-10-06 NOTE — Progress Notes (Signed)
[facility]. [address], [place]  [address] Phone: [phone] Fax:  [phone]  Date:  10/06/2016   ID:  Alicia [name], DOB 1956/09/24, MRN [id number]  PCP:  No PCP Per Patient   History of Present Illness: Alicia [name] is a 60 y.o. female with a history of chronic systolic heart failure (NICM) ejection fraction ranging from 25-40%, nuclear stress test with no ischemia, diabetes, hypertension, prior stroke here for followup.   She's not having any symptoms of heart failure however.  No syncope, no retention of fluid. No chest pain.  Coreg BID.   Trying to work on her diabetes.   Breathing up stairs (daughter thought it was horror move character [name] previously). Overall however she is feeling quite well, quite happy. No chest pain. No orthopnea. Her grandson also has skin condition that she has, hereditary with dark patches.  Bronchitis recently. On Bre[place]. Shaky.   Studies:  - LHC (02/19/14): Mid RCA 30-40, distal RCA 60, EF 25%.  - Echo (05/11/13): Moderate LVH, EF 30-40%.  - Nuclear (07/23/09 - in [place]): No ischemia.  - Holter (03/2012): NSR, occasional PVCs, no significant arrhythmia  Recent Labs:  02/18/2014: Pro B Natriuretic peptide (BNP) 2876.0*; TSH 1.032  02/20/2014: Hemoglobin 10.9*  03/27/2014: ALT 13; HDL Cholesterol by NMR 49.50; LDL (calc) 135*  04/02/2014: Creatinine 0.5; Potassium 3.8      Wt Readings from Last 3 Encounters:  10/06/16 194 lb 12.8 oz (88.4 kg)  04/06/16 189 lb 9.6 oz (86 kg)  02/13/16 192 lb (87.1 kg)     Past Medical History:  Diagnosis Date  . Chronic systolic dysfunction of left ventricle   . Diabetes mellitus, type II ([place])   . H/O: CVA (cerebrovascular accident)    6 strokes, most recent on 05/10/13  . Hyperlipidemia   . Hypertension   . NICM (nonischemic cardiomyopathy) ([place])    Echo (05/2014):  Mod LVH, EF 20-25%, inf HK, mild MR, mild LAE.  Marland Kitchen Noncompliance   . Tobacco abuse     Past Surgical History:    Procedure Laterality Date  . CARDIAC CATHETERIZATION    . HERNIA REPAIR    . LAPAROTOMY N/A 01/15/2014   Procedure: EXPLORATORY LAPAROTOMY SMALL BOWEL RESECTION;  Surgeon: Imogene Burn. Georgette Dover, MD;  Location: [facility];  Service: General;  Laterality: N/A;  . LEFT HEART CATHETERIZATION WITH CORONARY ANGIOGRAM N/A 02/19/2014   Procedure: LEFT HEART CATHETERIZATION WITH CORONARY ANGIOGRAM;  Surgeon: Troy Sine, MD;  Location: [facility] CATH LAB;  Service: Cardiovascular;  Laterality: N/A;  . LYSIS OF ADHESION N/A 01/15/2014   Procedure: LYSIS OF ADHESION;  Surgeon: Imogene Burn. Georgette Dover, MD;  Location: [facility];  Service: General;  Laterality: N/A;  . TUBAL LIGATION      Current Outpatient Prescriptions  Medication Sig Dispense Refill  . amLODipine (NORVASC) 10 MG tablet Take 1 tablet (10 mg total) by mouth daily. 90 tablet 3  . aspirin 81 MG tablet Take 81 mg by mouth daily.    Marland Kitchen atorvastatin (LIPITOR) 40 MG tablet Take 1 tablet (40 mg total) by mouth daily. 90 tablet 3  . BREO ELLIPTA 200-25 MCG/INH AEPB Inhale 1 puff into the lungs daily as needed.    . carvedilol (COREG) 12.5 MG tablet TAKE 1 TABLET BY MOUTH 2 TIMES DAILY. 180 tablet 2  . furosemide (LASIX) 40 MG tablet Take 1 tablet (40 mg total) by mouth daily. 90 tablet 3  . glimepiride (AMARYL)  4 MG tablet Take 1 tablet (4 mg total) by mouth daily before breakfast. 30 tablet 3  . glucose blood (ACCU-CHEK AVIVA PLUS) test strip USE TO TEST BLOOD SUGARS AS DIRECTED BY PHYSICIAN 100 each 12  . Insulin Glargine (LANTUS) 100 UNIT/ML Solostar Pen Inject 40 Units into the skin daily at 10 pm. 15 mL 3  . insulin lispro (HUMALOG) 100 UNIT/ML KiwkPen Inject 0.18 mLs (18 Units total) into the skin 3 (three) times daily. Administer subQ within 15 minutes before or immediately after a meal 15 mL 3  . lisinopril (PRINIVIL,ZESTRIL) 20 MG tablet Take 1 tablet (20 mg total) by mouth daily. 90 tablet 3  . metFORMIN (GLUCOPHAGE-XR) 500 MG 24 hr tablet Take 1,000 mg by mouth  2 (two) times daily.    . TRUEPLUS LANCETS 28G MISC USE TO TEST BLOOD SUGARS AS DIRECTED BY PHYSICIAN 100 each 12  . ULTICARE MICRO PEN NEEDLES 32G X 4 MM MISC USE 4 TIMES DAILY TO INJECT INSULIN 200 each 12   No current facility-administered medications for this visit.     Allergies:    Allergies  Allergen Reactions  . Other Other (See Comments)    Told not to take any dairy products-per MD    Social History:  The patient  reports that she has been smoking Cigarettes.  She has a 12.50 pack-year smoking history. She has never used smokeless tobacco. She reports that she does not drink alcohol or use drugs.   Family History  Problem Relation Age of Onset  . Diabetes Mellitus II Mother   . Hypertension Mother   . CVA Mother   . Stroke Mother   . Heart failure Brother   . Heart attack Neg Hx     ROS:  Please see the history of present illness.   No syncope, no orthopnea, no chest pain, no PND   All other systems reviewed and negative.   PHYSICAL EXAM: VS:  BP 136/72   Pulse 100   Ht 5' 6.5" (1.689 m)   Wt 194 lb 12.8 oz (88.4 kg)   LMP  (LMP Unknown)   BMI 30.97 kg/m  Well nourished, well developed, in no acute distress  HEENT: normal, [place]/AT, EOMI Neck: no JVD, normal carotid upstroke, no bruit Cardiac:  normal S1, S2; RRR; no murmur  Lungs:  clear to auscultation bilaterally, no wheezing, rhonchi or rales  Abd: soft, nontender, no hepatomegaly, no bruits Abdominal scar Ext: no edema, difficult distal pulses Skin: warm and dry Dark patches noted on elbows as well as Achilles region. GU: deferred Neuro: no focal abnormalities noted, AAO x 3  EKG:   EKG was ordered today 10/06/16 HR 100, -sinus rhythm, no pacing seen, right atrial enlargement, old septal infarct, nonspecific T-wave changes. Prior 09/30/15-sinus rhythm, 62, poor R-wave progression, T-wave inversion lateral leads, left anterior fascicular block. Personally viewed     ASSESSMENT AND PLAN:  1. Chronic  systolic heart failure-nonischemic. Her blood pressure is well controlled usually. She is taking her medications. Reviewed as above. Beta blocker, ACE inhibitor. New PCP. Dr. Nancy Fetter from [facility] she says 2. Hypertension-controlled today. Continue to encourage medication compliance. 3. Hyperlipidemia-atorvastatin 40. LDL 90 less than 100. 4. Coronary artery disease-nonobstructive disease. Continue with secondary prevention. Statin. 5. Prior ventral hernia repair- strangulation. 2/15 6. Diabetes-hemoglobin A1c 13 at one point-working with new PCP 7. Tobacco use - cessation. Discussed. Stopped for 3 weeks! Thinks flu shot made her sick.  8. 6 month followup  9. Appreciate  Dr. Rayann Heman consultation in the past. They discussed ICD, no ICD.  Signed, Candee Furbish, MD [facility]  10/06/2016 8:54 AM

## 2016-10-06 NOTE — Patient Instructions (Signed)

## 2016-10-29 DIAGNOSIS — Z23 Encounter for immunization: Secondary | ICD-10-CM | POA: Diagnosis not present

## 2016-10-29 DIAGNOSIS — Z87891 Personal history of nicotine dependence: Secondary | ICD-10-CM | POA: Diagnosis not present

## 2016-10-29 DIAGNOSIS — E7800 Pure hypercholesterolemia, unspecified: Secondary | ICD-10-CM | POA: Diagnosis not present

## 2016-10-29 DIAGNOSIS — J45909 Unspecified asthma, uncomplicated: Secondary | ICD-10-CM | POA: Diagnosis not present

## 2016-10-29 DIAGNOSIS — I10 Essential (primary) hypertension: Secondary | ICD-10-CM | POA: Diagnosis not present

## 2016-10-29 DIAGNOSIS — E119 Type 2 diabetes mellitus without complications: Secondary | ICD-10-CM | POA: Diagnosis not present

## 2016-10-29 DIAGNOSIS — Z79899 Other long term (current) drug therapy: Secondary | ICD-10-CM | POA: Diagnosis not present

## 2016-11-30 DIAGNOSIS — E7800 Pure hypercholesterolemia, unspecified: Secondary | ICD-10-CM | POA: Diagnosis not present

## 2016-11-30 DIAGNOSIS — I10 Essential (primary) hypertension: Secondary | ICD-10-CM | POA: Diagnosis not present

## 2016-11-30 DIAGNOSIS — E119 Type 2 diabetes mellitus without complications: Secondary | ICD-10-CM | POA: Diagnosis not present

## 2016-12-31 DIAGNOSIS — I10 Essential (primary) hypertension: Secondary | ICD-10-CM | POA: Diagnosis not present

## 2016-12-31 DIAGNOSIS — E7800 Pure hypercholesterolemia, unspecified: Secondary | ICD-10-CM | POA: Diagnosis not present

## 2016-12-31 DIAGNOSIS — E119 Type 2 diabetes mellitus without complications: Secondary | ICD-10-CM | POA: Diagnosis not present

## 2017-01-08 ENCOUNTER — Other Ambulatory Visit: Payer: Self-pay | Admitting: Internal Medicine

## 2017-01-08 DIAGNOSIS — Z1231 Encounter for screening mammogram for malignant neoplasm of breast: Secondary | ICD-10-CM

## 2017-01-29 DIAGNOSIS — E7800 Pure hypercholesterolemia, unspecified: Secondary | ICD-10-CM | POA: Diagnosis not present

## 2017-01-29 DIAGNOSIS — Z79899 Other long term (current) drug therapy: Secondary | ICD-10-CM | POA: Diagnosis not present

## 2017-01-29 DIAGNOSIS — E119 Type 2 diabetes mellitus without complications: Secondary | ICD-10-CM | POA: Diagnosis not present

## 2017-01-29 DIAGNOSIS — Z8673 Personal history of transient ischemic attack (TIA), and cerebral infarction without residual deficits: Secondary | ICD-10-CM | POA: Diagnosis not present

## 2017-01-29 DIAGNOSIS — I10 Essential (primary) hypertension: Secondary | ICD-10-CM | POA: Diagnosis not present

## 2017-02-12 ENCOUNTER — Ambulatory Visit
Admission: RE | Admit: 2017-02-12 | Discharge: 2017-02-12 | Disposition: A | Discharge status: Home / Self Care | Payer: Medicare Other | Source: Ambulatory Visit | Attending: Internal Medicine | Admitting: Internal Medicine

## 2017-02-12 DIAGNOSIS — Z1231 Encounter for screening mammogram for malignant neoplasm of breast: Secondary | ICD-10-CM

## 2017-03-05 DIAGNOSIS — E119 Type 2 diabetes mellitus without complications: Secondary | ICD-10-CM | POA: Diagnosis not present

## 2017-03-05 DIAGNOSIS — N182 Chronic kidney disease, stage 2 (mild): Secondary | ICD-10-CM | POA: Diagnosis not present

## 2017-03-05 DIAGNOSIS — M79604 Pain in right leg: Secondary | ICD-10-CM | POA: Diagnosis not present

## 2017-03-05 DIAGNOSIS — M79605 Pain in left leg: Secondary | ICD-10-CM | POA: Diagnosis not present

## 2017-03-05 DIAGNOSIS — E7800 Pure hypercholesterolemia, unspecified: Secondary | ICD-10-CM | POA: Diagnosis not present

## 2017-03-05 DIAGNOSIS — I10 Essential (primary) hypertension: Secondary | ICD-10-CM | POA: Diagnosis not present

## 2017-03-30 DIAGNOSIS — E7800 Pure hypercholesterolemia, unspecified: Secondary | ICD-10-CM | POA: Diagnosis not present

## 2017-03-30 DIAGNOSIS — I10 Essential (primary) hypertension: Secondary | ICD-10-CM | POA: Diagnosis not present

## 2017-03-30 DIAGNOSIS — E119 Type 2 diabetes mellitus without complications: Secondary | ICD-10-CM | POA: Diagnosis not present

## 2017-03-30 DIAGNOSIS — Z794 Long term (current) use of insulin: Secondary | ICD-10-CM | POA: Diagnosis not present

## 2017-04-06 ENCOUNTER — Ambulatory Visit (INDEPENDENT_AMBULATORY_CARE_PROVIDER_SITE_OTHER): Payer: Medicare Other | Admitting: Cardiology

## 2017-04-06 ENCOUNTER — Encounter: Payer: Self-pay | Admitting: Cardiology

## 2017-04-06 VITALS — BP 120/70 | HR 77 | Ht 67.0 in | Wt 183.0 lb

## 2017-04-06 DIAGNOSIS — F17200 Nicotine dependence, unspecified, uncomplicated: Secondary | ICD-10-CM | POA: Diagnosis not present

## 2017-04-06 DIAGNOSIS — E7800 Pure hypercholesterolemia, unspecified: Secondary | ICD-10-CM

## 2017-04-06 DIAGNOSIS — I2510 Atherosclerotic heart disease of native coronary artery without angina pectoris: Secondary | ICD-10-CM

## 2017-04-06 DIAGNOSIS — I5022 Chronic systolic (congestive) heart failure: Secondary | ICD-10-CM

## 2017-04-06 NOTE — Progress Notes (Signed)
[place]. [address], [address] [place], [place]  [address] Phone: [phone] Fax:  [phone]  Date:  04/06/2017   ID:  Alicia [name], DOB 1956/04/10, MRN [id number]  PCP:  Patient, No Pcp Per   History of Present Illness: Alicia [name] is a 61 y.o. female with a history of chronic systolic heart failure (NICM) ejection fraction ranging from 25-40%, nuclear stress test with no ischemia, diabetes, hypertension, prior stroke here for followup.   No syncope, no retention of fluid. No chest pain.  Coreg BID.   At prior visit noted breathing up stairs (daughter thought it was horror move character [name] previously).  Her grandson also has skin condition that she has, hereditary with dark patches.  Overall today she is doing quite well, no chest pain, no shortness of breath, no syncope. She enjoyed a trip to [place]. This was the first time she was on an airplane. She left the day after the [place] here in [place].   Studies:  - LHC (02/19/14): Mid RCA 30-40, distal RCA 60, EF 25%.  - Echo (05/11/13): Moderate LVH, EF 30-40%.  - Nuclear (07/23/09 - in [place]): No ischemia.  - Holter (03/2012): NSR, occasional PVCs, no significant arrhythmia  Recent Labs:  02/18/2014: Pro B Natriuretic peptide (BNP) 2876.0*; TSH 1.032  02/20/2014: Hemoglobin 10.9*  03/27/2014: ALT 13; HDL Cholesterol by NMR 49.50; LDL (calc) 135*  04/02/2014: Creatinine 0.5; Potassium 3.8      Wt Readings from Last 3 Encounters:  04/06/17 183 lb (83 kg)  10/06/16 194 lb 12.8 oz (88.4 kg)  04/06/16 189 lb 9.6 oz (86 kg)     Past Medical History:  Diagnosis Date  . Chronic systolic dysfunction of left ventricle   . Diabetes mellitus, type II ([place])   . H/O: CVA (cerebrovascular accident)    6 strokes, most recent on 05/10/13  . Hyperlipidemia   . Hypertension   . NICM (nonischemic cardiomyopathy) ([place])    Echo (05/2014):  Mod LVH, EF 20-25%, inf HK, mild MR, mild LAE.  Marland Kitchen  Noncompliance   . Tobacco abuse     Past Surgical History:  Procedure Laterality Date  . CARDIAC CATHETERIZATION    . HERNIA REPAIR    . LAPAROTOMY N/A 01/15/2014   Procedure: EXPLORATORY LAPAROTOMY SMALL BOWEL RESECTION;  Surgeon: Imogene Burn. Georgette Dover, MD;  Location: [facility];  Service: General;  Laterality: N/A;  . LEFT HEART CATHETERIZATION WITH CORONARY ANGIOGRAM N/A 02/19/2014   Procedure: LEFT HEART CATHETERIZATION WITH CORONARY ANGIOGRAM;  Surgeon: Troy Sine, MD;  Location: [facility] CATH LAB;  Service: Cardiovascular;  Laterality: N/A;  . LYSIS OF ADHESION N/A 01/15/2014   Procedure: LYSIS OF ADHESION;  Surgeon: Imogene Burn. Georgette Dover, MD;  Location: [facility];  Service: General;  Laterality: N/A;  . TUBAL LIGATION      Current Outpatient Prescriptions  Medication Sig Dispense Refill  . amLODipine (NORVASC) 10 MG tablet Take 1 tablet (10 mg total) by mouth daily. 90 tablet 3  . aspirin 81 MG tablet Take 81 mg by mouth daily.    Marland Kitchen atorvastatin (LIPITOR) 40 MG tablet Take 1 tablet (40 mg total) by mouth daily. 90 tablet 3  . BREO ELLIPTA 200-25 MCG/INH AEPB Inhale 1 puff into the lungs daily as needed.    . carvedilol (COREG) 12.5 MG tablet TAKE 1 TABLET BY MOUTH 2 TIMES DAILY. 180 tablet 2  . furosemide (LASIX) 40 MG tablet Take 1 tablet (  40 mg total) by mouth daily. 90 tablet 3  . glimepiride (AMARYL) 4 MG tablet Take 1 tablet (4 mg total) by mouth daily before breakfast. 30 tablet 3  . glucose blood (ACCU-CHEK AVIVA PLUS) test strip USE TO TEST BLOOD SUGARS AS DIRECTED BY PHYSICIAN 100 each 12  . Insulin Glargine (LANTUS) 100 UNIT/ML Solostar Pen Inject 40 Units into the skin daily at 10 pm. 15 mL 3  . insulin lispro (HUMALOG) 100 UNIT/ML KiwkPen Inject 0.18 mLs (18 Units total) into the skin 3 (three) times daily. Administer subQ within 15 minutes before or immediately after a meal 15 mL 3  . lisinopril (PRINIVIL,ZESTRIL) 20 MG tablet Take 1 tablet (20 mg total) by mouth daily. 90 tablet 3  .  metFORMIN (GLUCOPHAGE-XR) 500 MG 24 hr tablet Take 1,000 mg by mouth 2 (two) times daily.    . TRUEPLUS LANCETS 28G MISC USE TO TEST BLOOD SUGARS AS DIRECTED BY PHYSICIAN 100 each 12  . ULTICARE MICRO PEN NEEDLES 32G X 4 MM MISC USE 4 TIMES DAILY TO INJECT INSULIN 200 each 12   No current facility-administered medications for this visit.     Allergies:    Allergies  Allergen Reactions  . Other Other (See Comments)    Told not to take any dairy products-per MD    Social History:  The patient  reports that she has been smoking Cigarettes.  She has a 12.50 pack-year smoking history. She has never used smokeless tobacco. She reports that she does not drink alcohol or use drugs.   Family History  Problem Relation Age of Onset  . Diabetes Mellitus II Mother   . Hypertension Mother   . CVA Mother   . Stroke Mother   . Heart failure Brother   . Heart attack Neg Hx     ROS:  Please see the history of present illness.   No syncope, no orthopnea, no chest pain, no PND   All other systems reviewed and negative.   PHYSICAL EXAM: VS:  BP 120/70   Pulse 77   Ht 5\' 7"  (1.702 m)   Wt 183 lb (83 kg)   LMP  (LMP Unknown)   SpO2 92%   BMI 28.66 kg/m  GEN: Well nourished, well developed, in no acute distress  HEENT: normal  Neck: no JVD, carotid bruits, or masses Cardiac: RRR; no murmurs, rubs, or gallops,no edema  Respiratory:  clear to auscultation bilaterally, normal work of breathing GI: soft, nontender, nondistended, + BS MS: no deformity or atrophy  Skin: warm and dry, dark patches noted over skin  Neuro:  Alert and Oriented x 3, Strength and sensation are intact Psych: euthymic mood, full affect   EKG:   EKG was ordered today 10/06/16 HR 100, -sinus rhythm, no pacing seen, right atrial enlargement, old septal infarct, nonspecific T-wave changes. Prior 09/30/15-sinus rhythm, 62, poor R-wave progression, T-wave inversion lateral leads, left anterior fascicular block. Personally viewed      ECHO 05/28/14: - Left ventricle: The cavity size was normal. There was moderate concentric hypertrophy. Systolic function was severely reduced. The estimated ejection fraction was in the range of 20% to 25%. There is hypokinesis of the inferior myocardium. - Mitral valve: There was mild regurgitation. - Left atrium: The atrium was mildly dilated.  ASSESSMENT AND PLAN:    1. Chronic systolic heart failure-nonischemic. NYHA 1. Her blood pressure is well controlled usually. She is taking her medications. Reviewed as above. Beta blocker, ACE inhibitor. New PCP. Dr.  Sun from [place] has been watching her closely. Overall doing quite well. Was able to walk around [facility] without much difficulty. Previously had discussion, no ICD. 2. Hypertension-controlled today. Continue to encourage medication compliance. 3. Hyperlipidemia-atorvastatin 40. LDL 90 less than 100. Overall doing quite well.  4. Coronary artery disease-nonobstructive disease. Continue with secondary prevention. Statin. No myalgias 5. Prior ventral hernia repair- strangulation. 2/15, stable 6. Diabetes-hemoglobin A1c 13 at one point, now much better-working with Dr. Nancy Fetter PCP, Lantus. 7. Tobacco use - cessation. Discussed. Quit one month prior, now back to smoking.   8. 6 month followup    Signed, Candee Furbish, MD [facility]  04/06/2017 11:16 AM

## 2017-04-06 NOTE — Patient Instructions (Signed)

## 2017-04-30 DIAGNOSIS — Z794 Long term (current) use of insulin: Secondary | ICD-10-CM | POA: Diagnosis not present

## 2017-04-30 DIAGNOSIS — Z87891 Personal history of nicotine dependence: Secondary | ICD-10-CM | POA: Diagnosis not present

## 2017-04-30 DIAGNOSIS — Z0000 Encounter for general adult medical examination without abnormal findings: Secondary | ICD-10-CM | POA: Diagnosis not present

## 2017-04-30 DIAGNOSIS — E119 Type 2 diabetes mellitus without complications: Secondary | ICD-10-CM | POA: Diagnosis not present

## 2017-04-30 DIAGNOSIS — R0602 Shortness of breath: Secondary | ICD-10-CM | POA: Diagnosis not present

## 2017-05-16 DIAGNOSIS — E119 Type 2 diabetes mellitus without complications: Secondary | ICD-10-CM | POA: Diagnosis not present

## 2017-05-16 DIAGNOSIS — L03213 Periorbital cellulitis: Secondary | ICD-10-CM | POA: Diagnosis not present

## 2017-05-16 DIAGNOSIS — Z79899 Other long term (current) drug therapy: Secondary | ICD-10-CM | POA: Diagnosis not present

## 2017-05-29 ENCOUNTER — Other Ambulatory Visit: Payer: Self-pay | Admitting: Internal Medicine

## 2017-05-31 DIAGNOSIS — E7800 Pure hypercholesterolemia, unspecified: Secondary | ICD-10-CM | POA: Diagnosis not present

## 2017-05-31 DIAGNOSIS — I10 Essential (primary) hypertension: Secondary | ICD-10-CM | POA: Diagnosis not present

## 2017-05-31 DIAGNOSIS — Z794 Long term (current) use of insulin: Secondary | ICD-10-CM | POA: Diagnosis not present

## 2017-05-31 DIAGNOSIS — E119 Type 2 diabetes mellitus without complications: Secondary | ICD-10-CM | POA: Diagnosis not present

## 2017-07-01 DIAGNOSIS — E7800 Pure hypercholesterolemia, unspecified: Secondary | ICD-10-CM | POA: Diagnosis not present

## 2017-07-01 DIAGNOSIS — E119 Type 2 diabetes mellitus without complications: Secondary | ICD-10-CM | POA: Diagnosis not present

## 2017-07-01 DIAGNOSIS — Z794 Long term (current) use of insulin: Secondary | ICD-10-CM | POA: Diagnosis not present

## 2017-07-01 DIAGNOSIS — I10 Essential (primary) hypertension: Secondary | ICD-10-CM | POA: Diagnosis not present

## 2017-07-27 DIAGNOSIS — E7800 Pure hypercholesterolemia, unspecified: Secondary | ICD-10-CM | POA: Diagnosis not present

## 2017-07-27 DIAGNOSIS — E119 Type 2 diabetes mellitus without complications: Secondary | ICD-10-CM | POA: Diagnosis not present

## 2017-07-27 DIAGNOSIS — Z794 Long term (current) use of insulin: Secondary | ICD-10-CM | POA: Diagnosis not present

## 2017-07-27 DIAGNOSIS — R1084 Generalized abdominal pain: Secondary | ICD-10-CM | POA: Diagnosis not present

## 2017-07-29 DIAGNOSIS — R1084 Generalized abdominal pain: Secondary | ICD-10-CM | POA: Diagnosis not present

## 2017-07-30 ENCOUNTER — Emergency Department (HOSPITAL_COMMUNITY): Payer: Medicare Other

## 2017-07-30 ENCOUNTER — Inpatient Hospital Stay (HOSPITAL_COMMUNITY)
Admission: EM | Admit: 2017-07-30 | Discharge: 2017-08-03 | DRG: 418 | Disposition: A | Discharge status: Home / Self Care | Payer: Medicare Other | Attending: Oncology | Admitting: Oncology

## 2017-07-30 ENCOUNTER — Encounter (HOSPITAL_COMMUNITY): Payer: Self-pay | Admitting: Emergency Medicine

## 2017-07-30 DIAGNOSIS — I429 Cardiomyopathy, unspecified: Secondary | ICD-10-CM | POA: Diagnosis present

## 2017-07-30 DIAGNOSIS — I5022 Chronic systolic (congestive) heart failure: Secondary | ICD-10-CM | POA: Diagnosis not present

## 2017-07-30 DIAGNOSIS — I110 Hypertensive heart disease with heart failure: Secondary | ICD-10-CM | POA: Diagnosis not present

## 2017-07-30 DIAGNOSIS — R3989 Other symptoms and signs involving the genitourinary system: Secondary | ICD-10-CM | POA: Diagnosis not present

## 2017-07-30 DIAGNOSIS — E876 Hypokalemia: Secondary | ICD-10-CM | POA: Diagnosis present

## 2017-07-30 DIAGNOSIS — N390 Urinary tract infection, site not specified: Secondary | ICD-10-CM | POA: Diagnosis not present

## 2017-07-30 DIAGNOSIS — E1165 Type 2 diabetes mellitus with hyperglycemia: Secondary | ICD-10-CM | POA: Diagnosis present

## 2017-07-30 DIAGNOSIS — N179 Acute kidney failure, unspecified: Secondary | ICD-10-CM | POA: Diagnosis not present

## 2017-07-30 DIAGNOSIS — Z7984 Long term (current) use of oral hypoglycemic drugs: Secondary | ICD-10-CM | POA: Diagnosis not present

## 2017-07-30 DIAGNOSIS — E1110 Type 2 diabetes mellitus with ketoacidosis without coma: Secondary | ICD-10-CM | POA: Diagnosis not present

## 2017-07-30 DIAGNOSIS — Z7982 Long term (current) use of aspirin: Secondary | ICD-10-CM | POA: Diagnosis not present

## 2017-07-30 DIAGNOSIS — I700 Atherosclerosis of aorta: Secondary | ICD-10-CM | POA: Diagnosis present

## 2017-07-30 DIAGNOSIS — B961 Klebsiella pneumoniae [K. pneumoniae] as the cause of diseases classified elsewhere: Secondary | ICD-10-CM | POA: Diagnosis not present

## 2017-07-30 DIAGNOSIS — Z8673 Personal history of transient ischemic attack (TIA), and cerebral infarction without residual deficits: Secondary | ICD-10-CM | POA: Diagnosis not present

## 2017-07-30 DIAGNOSIS — Z794 Long term (current) use of insulin: Secondary | ICD-10-CM | POA: Diagnosis not present

## 2017-07-30 DIAGNOSIS — K8066 Calculus of gallbladder and bile duct with acute and chronic cholecystitis without obstruction: Secondary | ICD-10-CM | POA: Diagnosis not present

## 2017-07-30 DIAGNOSIS — Z833 Family history of diabetes mellitus: Secondary | ICD-10-CM

## 2017-07-30 DIAGNOSIS — E785 Hyperlipidemia, unspecified: Secondary | ICD-10-CM | POA: Diagnosis not present

## 2017-07-30 DIAGNOSIS — E871 Hypo-osmolality and hyponatremia: Secondary | ICD-10-CM | POA: Diagnosis not present

## 2017-07-30 DIAGNOSIS — I10 Essential (primary) hypertension: Secondary | ICD-10-CM | POA: Diagnosis present

## 2017-07-30 DIAGNOSIS — K8000 Calculus of gallbladder with acute cholecystitis without obstruction: Principal | ICD-10-CM | POA: Diagnosis present

## 2017-07-30 DIAGNOSIS — F17210 Nicotine dependence, cigarettes, uncomplicated: Secondary | ICD-10-CM | POA: Diagnosis present

## 2017-07-30 DIAGNOSIS — I2583 Coronary atherosclerosis due to lipid rich plaque: Secondary | ICD-10-CM | POA: Diagnosis not present

## 2017-07-30 DIAGNOSIS — Z8249 Family history of ischemic heart disease and other diseases of the circulatory system: Secondary | ICD-10-CM

## 2017-07-30 DIAGNOSIS — N814 Uterovaginal prolapse, unspecified: Secondary | ICD-10-CM | POA: Diagnosis present

## 2017-07-30 DIAGNOSIS — E860 Dehydration: Secondary | ICD-10-CM | POA: Diagnosis not present

## 2017-07-30 DIAGNOSIS — I428 Other cardiomyopathies: Secondary | ICD-10-CM | POA: Diagnosis not present

## 2017-07-30 DIAGNOSIS — E119 Type 2 diabetes mellitus without complications: Secondary | ICD-10-CM | POA: Diagnosis not present

## 2017-07-30 DIAGNOSIS — K56609 Unspecified intestinal obstruction, unspecified as to partial versus complete obstruction: Secondary | ICD-10-CM

## 2017-07-30 DIAGNOSIS — E113399 Type 2 diabetes mellitus with moderate nonproliferative diabetic retinopathy without macular edema, unspecified eye: Secondary | ICD-10-CM

## 2017-07-30 DIAGNOSIS — R059 Cough, unspecified: Secondary | ICD-10-CM

## 2017-07-30 DIAGNOSIS — I2510 Atherosclerotic heart disease of native coronary artery without angina pectoris: Secondary | ICD-10-CM | POA: Diagnosis not present

## 2017-07-30 DIAGNOSIS — K8012 Calculus of gallbladder with acute and chronic cholecystitis without obstruction: Secondary | ICD-10-CM | POA: Diagnosis not present

## 2017-07-30 DIAGNOSIS — R05 Cough: Secondary | ICD-10-CM | POA: Diagnosis not present

## 2017-07-30 DIAGNOSIS — R1111 Vomiting without nausea: Secondary | ICD-10-CM | POA: Diagnosis not present

## 2017-07-30 DIAGNOSIS — K8010 Calculus of gallbladder with chronic cholecystitis without obstruction: Secondary | ICD-10-CM

## 2017-07-30 DIAGNOSIS — I5032 Chronic diastolic (congestive) heart failure: Secondary | ICD-10-CM | POA: Diagnosis present

## 2017-07-30 DIAGNOSIS — I5042 Chronic combined systolic (congestive) and diastolic (congestive) heart failure: Secondary | ICD-10-CM | POA: Diagnosis present

## 2017-07-30 DIAGNOSIS — K8001 Calculus of gallbladder with acute cholecystitis with obstruction: Secondary | ICD-10-CM | POA: Diagnosis not present

## 2017-07-30 DIAGNOSIS — R1011 Right upper quadrant pain: Secondary | ICD-10-CM | POA: Diagnosis not present

## 2017-07-30 DIAGNOSIS — K8020 Calculus of gallbladder without cholecystitis without obstruction: Secondary | ICD-10-CM | POA: Diagnosis not present

## 2017-07-30 HISTORY — DX: Uterovaginal prolapse, unspecified: N81.4

## 2017-07-30 HISTORY — DX: Unspecified intestinal obstruction, unspecified as to partial versus complete obstruction: K56.609

## 2017-07-30 LAB — LIPASE, BLOOD
LIPASE: 30 U/L (ref 11–51)
LIPASE: 24 U/L (ref 11–51)

## 2017-07-30 LAB — CBC WITH DIFFERENTIAL/PLATELET
BASOS PCT: 0 %
Basophils Absolute: 0 10*3/uL (ref 0.0–0.1)
EOS ABS: 0 10*3/uL (ref 0.0–0.7)
EOS PCT: 0 %
HCT: 38 % (ref 36.0–46.0)
HEMOGLOBIN: 13.1 g/dL (ref 12.0–15.0)
LYMPHS ABS: 0.5 10*3/uL — AB (ref 0.7–4.0)
Lymphocytes Relative: 3 %
MCH: 26.3 pg (ref 26.0–34.0)
MCHC: 34.5 g/dL (ref 30.0–36.0)
MCV: 76.2 fL — ABNORMAL LOW (ref 78.0–100.0)
MONO ABS: 0.7 10*3/uL (ref 0.1–1.0)
MONOS PCT: 4 %
NEUTROS PCT: 93 %
Neutro Abs: 16.5 10*3/uL — ABNORMAL HIGH (ref 1.7–7.7)
Platelets: 214 10*3/uL (ref 150–400)
RBC: 4.99 MIL/uL (ref 3.87–5.11)
RDW: 14.1 % (ref 11.5–15.5)
WBC: 17.7 10*3/uL — ABNORMAL HIGH (ref 4.0–10.5)

## 2017-07-30 LAB — COMPREHENSIVE METABOLIC PANEL
ALBUMIN: 2.4 g/dL — AB (ref 3.5–5.0)
ALK PHOS: 235 U/L — AB (ref 38–126)
ALT: 587 U/L — AB (ref 14–54)
ALT: 416 U/L — ABNORMAL HIGH (ref 14–54)
ANION GAP: 12 (ref 5–15)
AST: 155 U/L — AB (ref 15–41)
AST: 106 U/L — ABNORMAL HIGH (ref 15–41)
Albumin: 2.9 g/dL — ABNORMAL LOW (ref 3.5–5.0)
Alkaline Phosphatase: 193 U/L — ABNORMAL HIGH (ref 38–126)
Anion gap: 16 — ABNORMAL HIGH (ref 5–15)
BUN: 39 mg/dL — AB (ref 6–20)
BUN: 32 mg/dL — ABNORMAL HIGH (ref 6–20)
CALCIUM: 7.5 mg/dL — AB (ref 8.9–10.3)
CALCIUM: 6.6 mg/dL — AB (ref 8.9–10.3)
CHLORIDE: 81 mmol/L — AB (ref 101–111)
CO2: 25 mmol/L (ref 22–32)
CO2: 22 mmol/L (ref 22–32)
CREATININE: 1.65 mg/dL — AB (ref 0.44–1.00)
Chloride: 93 mmol/L — ABNORMAL LOW (ref 101–111)
Creatinine, Ser: 1.21 mg/dL — ABNORMAL HIGH (ref 0.44–1.00)
GFR calc non Af Amer: 33 mL/min — ABNORMAL LOW (ref >60)
GFR calc non Af Amer: 48 mL/min — ABNORMAL LOW (ref >60)
GFR, EST AFRICAN AMERICAN: 38 mL/min — AB (ref >60)
GFR, EST AFRICAN AMERICAN: 56 mL/min — AB (ref >60)
GLUCOSE: 424 mg/dL — AB (ref 65–99)
GLUCOSE: 275 mg/dL — AB (ref 65–99)
POTASSIUM: 4.3 mmol/L (ref 3.5–5.1)
Potassium: 3.1 mmol/L — ABNORMAL LOW (ref 3.5–5.1)
SODIUM: 122 mmol/L — AB (ref 135–145)
SODIUM: 127 mmol/L — AB (ref 135–145)
TOTAL PROTEIN: 5.8 g/dL — AB (ref 6.5–8.1)
Total Bilirubin: 6.1 mg/dL — ABNORMAL HIGH (ref 0.3–1.2)
Total Bilirubin: 5.6 mg/dL — ABNORMAL HIGH (ref 0.3–1.2)
Total Protein: 7 g/dL (ref 6.5–8.1)

## 2017-07-30 LAB — URINALYSIS, ROUTINE W REFLEX MICROSCOPIC
Bilirubin Urine: NEGATIVE
Ketones, ur: 5 mg/dL — AB
Nitrite: NEGATIVE
PROTEIN: 100 mg/dL — AB
SPECIFIC GRAVITY, URINE: 1.014 (ref 1.005–1.030)
pH: 5 (ref 5.0–8.0)

## 2017-07-30 LAB — CBG MONITORING, ED
GLUCOSE-CAPILLARY: 272 mg/dL — AB (ref 65–99)
Glucose-Capillary: 261 mg/dL — ABNORMAL HIGH (ref 65–99)

## 2017-07-30 LAB — GLUCOSE, CAPILLARY
GLUCOSE-CAPILLARY: 171 mg/dL — AB (ref 65–99)
Glucose-Capillary: 188 mg/dL — ABNORMAL HIGH (ref 65–99)

## 2017-07-30 LAB — PROTIME-INR
INR: 1.23
PROTHROMBIN TIME: 15.4 s — AB (ref 11.4–15.2)

## 2017-07-30 LAB — MAGNESIUM: MAGNESIUM: 1.2 mg/dL — AB (ref 1.7–2.4)

## 2017-07-30 LAB — I-STAT CG4 LACTIC ACID, ED: Lactic Acid, Venous: 2.34 mmol/L (ref 0.5–1.9)

## 2017-07-30 MED ORDER — METRONIDAZOLE IN NACL 5-0.79 MG/ML-% IV SOLN
500.0000 mg | Freq: Once | INTRAVENOUS | Status: AC
  Administered 2017-07-30: 500 mg via INTRAVENOUS
  Filled 2017-07-30: qty 100

## 2017-07-30 MED ORDER — INSULIN ASPART 100 UNIT/ML ~~LOC~~ SOLN
8.0000 [IU] | Freq: Once | SUBCUTANEOUS | Status: AC
  Administered 2017-07-30: 8 [IU] via INTRAVENOUS
  Filled 2017-07-30: qty 1

## 2017-07-30 MED ORDER — MORPHINE SULFATE (PF) 4 MG/ML IV SOLN
4.0000 mg | INTRAVENOUS | Status: DC | PRN
  Administered 2017-07-30: 4 mg via INTRAVENOUS
  Filled 2017-07-30: qty 1

## 2017-07-30 MED ORDER — ONDANSETRON HCL 4 MG/2ML IJ SOLN
4.0000 mg | Freq: Four times a day (QID) | INTRAMUSCULAR | Status: DC | PRN
  Administered 2017-07-31 – 2017-08-02 (×3): 4 mg via INTRAVENOUS
  Filled 2017-07-30 (×3): qty 2

## 2017-07-30 MED ORDER — DEXTROSE 5 % IV SOLN
1.0000 g | INTRAVENOUS | Status: DC
  Filled 2017-07-30: qty 10

## 2017-07-30 MED ORDER — MAGNESIUM SULFATE 2 GM/50ML IV SOLN
2.0000 g | Freq: Once | INTRAVENOUS | Status: AC
  Administered 2017-07-30: 2 g via INTRAVENOUS
  Filled 2017-07-30: qty 50

## 2017-07-30 MED ORDER — POTASSIUM CHLORIDE 10 MEQ/100ML IV SOLN
10.0000 meq | INTRAVENOUS | Status: AC
  Administered 2017-07-30 (×2): 10 meq via INTRAVENOUS
  Filled 2017-07-30: qty 100

## 2017-07-30 MED ORDER — ACETAMINOPHEN 325 MG PO TABS: 650.0000 mg | ORAL_TABLET | Freq: Four times a day (QID) | ORAL | Status: DC | PRN

## 2017-07-30 MED ORDER — ENOXAPARIN SODIUM 40 MG/0.4ML ~~LOC~~ SOLN
40.0000 mg | SUBCUTANEOUS | Status: AC
  Administered 2017-07-30: 40 mg via SUBCUTANEOUS
  Filled 2017-07-30: qty 0.4

## 2017-07-30 MED ORDER — ONDANSETRON HCL 4 MG PO TABS: 4.0000 mg | ORAL_TABLET | Freq: Four times a day (QID) | ORAL | Status: DC | PRN

## 2017-07-30 MED ORDER — CARVEDILOL 12.5 MG PO TABS
12.5000 mg | ORAL_TABLET | Freq: Two times a day (BID) | ORAL | Status: DC
  Administered 2017-07-30 – 2017-08-03 (×8): 12.5 mg via ORAL
  Filled 2017-07-30 (×8): qty 1

## 2017-07-30 MED ORDER — ACETAMINOPHEN 650 MG RE SUPP: 650.0000 mg | Freq: Four times a day (QID) | RECTAL | Status: DC | PRN

## 2017-07-30 MED ORDER — INSULIN ASPART 100 UNIT/ML ~~LOC~~ SOLN
0.0000 [IU] | Freq: Three times a day (TID) | SUBCUTANEOUS | Status: DC
  Administered 2017-07-30: 5 [IU] via SUBCUTANEOUS
  Administered 2017-07-30: 2 [IU] via SUBCUTANEOUS
  Administered 2017-07-31: 3 [IU] via SUBCUTANEOUS
  Administered 2017-07-31: 2 [IU] via SUBCUTANEOUS
  Filled 2017-07-30: qty 1

## 2017-07-30 MED ORDER — FENTANYL CITRATE (PF) 100 MCG/2ML IJ SOLN
50.0000 ug | Freq: Once | INTRAMUSCULAR | Status: AC
  Administered 2017-07-30: 50 ug via INTRAVENOUS
  Filled 2017-07-30: qty 2

## 2017-07-30 MED ORDER — ATORVASTATIN CALCIUM 40 MG PO TABS
40.0000 mg | ORAL_TABLET | Freq: Every day | ORAL | Status: DC
  Administered 2017-07-30: 40 mg via ORAL
  Filled 2017-07-30: qty 1

## 2017-07-30 MED ORDER — SODIUM CHLORIDE 0.9 % IV BOLUS (SEPSIS)
1000.0000 mL | Freq: Once | INTRAVENOUS | Status: AC
  Administered 2017-07-30: 1000 mL via INTRAVENOUS

## 2017-07-30 MED ORDER — POTASSIUM CHLORIDE 10 MEQ/100ML IV SOLN
10.0000 meq | INTRAVENOUS | Status: AC
  Administered 2017-07-30: 10 meq via INTRAVENOUS
  Filled 2017-07-30: qty 100

## 2017-07-30 MED ORDER — POTASSIUM CHLORIDE 10 MEQ/100ML IV SOLN
INTRAVENOUS | Status: AC
  Administered 2017-07-30: 10 meq
  Filled 2017-07-30: qty 100

## 2017-07-30 MED ORDER — ASPIRIN EC 81 MG PO TBEC
81.0000 mg | DELAYED_RELEASE_TABLET | Freq: Every day | ORAL | Status: DC
  Administered 2017-07-30 – 2017-08-03 (×5): 81 mg via ORAL
  Filled 2017-07-30 (×5): qty 1

## 2017-07-30 MED ORDER — AMLODIPINE BESYLATE 10 MG PO TABS
10.0000 mg | ORAL_TABLET | Freq: Every day | ORAL | Status: DC
  Administered 2017-07-30 – 2017-08-03 (×5): 10 mg via ORAL
  Filled 2017-07-30: qty 2
  Filled 2017-07-30 (×4): qty 1

## 2017-07-30 MED ORDER — PROMETHAZINE HCL 25 MG/ML IJ SOLN
12.5000 mg | INTRAMUSCULAR | Status: AC | PRN
  Administered 2017-07-30 (×2): 12.5 mg via INTRAVENOUS
  Filled 2017-07-30 (×2): qty 1

## 2017-07-30 MED ORDER — INSULIN GLARGINE 100 UNIT/ML ~~LOC~~ SOLN
25.0000 [IU] | Freq: Every day | SUBCUTANEOUS | Status: DC
  Administered 2017-07-30 – 2017-08-01 (×3): 25 [IU] via SUBCUTANEOUS
  Filled 2017-07-30 (×3): qty 0.25

## 2017-07-30 MED ORDER — POTASSIUM CHLORIDE 10 MEQ/100ML IV SOLN
INTRAVENOUS | Status: AC
  Administered 2017-07-30: 10 meq via INTRAVENOUS
  Filled 2017-07-30: qty 100

## 2017-07-30 MED ORDER — DEXTROSE 5 % IV SOLN
1.0000 g | Freq: Once | INTRAVENOUS | Status: AC
  Administered 2017-07-30: 1 g via INTRAVENOUS
  Filled 2017-07-30: qty 10

## 2017-07-30 MED ORDER — POTASSIUM CHLORIDE IN NACL 20-0.9 MEQ/L-% IV SOLN
INTRAVENOUS | Status: DC
  Administered 2017-07-30 – 2017-07-31 (×2): via INTRAVENOUS
  Filled 2017-07-30 (×2): qty 1000

## 2017-07-30 MED ORDER — MORPHINE SULFATE (PF) 4 MG/ML IV SOLN
2.0000 mg | INTRAVENOUS | Status: DC | PRN
  Administered 2017-07-31 – 2017-08-01 (×2): 2 mg via INTRAVENOUS
  Filled 2017-07-30 (×2): qty 1

## 2017-07-30 MED ORDER — ONDANSETRON HCL 4 MG/2ML IJ SOLN
4.0000 mg | Freq: Once | INTRAMUSCULAR | Status: AC
  Administered 2017-07-30: 4 mg via INTRAVENOUS
  Filled 2017-07-30: qty 2

## 2017-07-30 MED ORDER — METRONIDAZOLE IN NACL 5-0.79 MG/ML-% IV SOLN
500.0000 mg | Freq: Three times a day (TID) | INTRAVENOUS | Status: DC
  Administered 2017-07-30 – 2017-08-03 (×11): 500 mg via INTRAVENOUS
  Filled 2017-07-30 (×13): qty 100

## 2017-07-30 NOTE — ED Notes (Signed)
PT placed on purewick urinary catheter at this time. PT reports pain is managable and rates pain 4/10. No requests at this time. Call bell in reach.

## 2017-07-30 NOTE — ED Notes (Addendum)
This RN spoke with Dr. Posey Pronto regarding pt potassium order. Per admitting, pt was meant to have a total of 4 potassium 16mEq in 161mL IVPB administered. Per Baldo Ash, RN order for earlier 2 doses of ordered medication had expired before they were given, therefore pt has only received 2 doses total. Per admitting, will administer the earlier 2 ordered doses of potassium chloride 29mEq in 13mL IVPB. This RN used override pull to remove potassium from pyxis in order to avoid confusion regarding amount of doses of potassium expected. Will notify floor RN when pt assigned bed to ensure clarification.

## 2017-07-30 NOTE — ED Notes (Signed)
Carvedilol not found in pyxis. Will call pharmacy

## 2017-07-30 NOTE — ED Notes (Signed)
ED Provider at bedside. 

## 2017-07-30 NOTE — ED Triage Notes (Signed)
Pt states she has had abdominal pain, right sided, with nausea, vomiting, and diarrhea since Monday. Seen by [facility] and ultrasound revealed gallstones. Pt is unable to tolerate the pain and other symptoms.  Pt visibly uncomfortable.  Alert and oriented.

## 2017-07-30 NOTE — ED Notes (Signed)
Patient transported to CT 

## 2017-07-30 NOTE — ED Provider Notes (Signed)
[facility] DEPT Provider Note   CSN: [id number] Arrival date & time: 07/30/17  0610     History   Chief Complaint Chief Complaint  Patient presents with  . Abdominal Pain    HPI Alicia [name] is a 61 y.o. female.  The history is provided by the patient.  Abdominal Pain   This is a new problem. The current episode started more than 2 days ago. The problem occurs daily. The problem has been gradually worsening. The pain is located in the RUQ and RLQ. The pain is severe. Associated symptoms include diarrhea, nausea and vomiting. Pertinent negatives include fever. The symptoms are aggravated by certain positions and palpation. Nothing relieves the symptoms.  pt reports onset of abdominal pain earlier this week She was seen by PCP yesterday at [facility], and in office ultrasound revealed gallstones She reports since that time her pain has worsened and she is having nausea/vomiting No new CP She reports "foul smelling" urine as well   Past Medical History:  Diagnosis Date  . Chronic systolic dysfunction of left ventricle   . Diabetes mellitus, type II ([place])   . H/O: CVA (cerebrovascular accident)    6 strokes, most recent on 05/10/13  . Hyperlipidemia   . Hypertension   . NICM (nonischemic cardiomyopathy) ([place])    Echo (05/2014):  Mod LVH, EF 20-25%, inf HK, mild MR, mild LAE.  Marland Kitchen Noncompliance   . Tobacco abuse     Patient Active Problem List   Diagnosis Date Noted  . Cystocele, grade 3 02/12/2016  . First degree uterine prolapse 02/12/2016  . Essential hypertension 11/09/2014  . Coronary artery disease due to lipid rich plaque 11/09/2014  . Type II or unspecified type diabetes mellitus without mention of complication, not stated as uncontrolled 07/02/2014  . Hypertensive cardiovascular disease 06/11/2014  . Chronic systolic heart failure ([place]) 05/10/2014  . HTN (hypertension) 05/10/2014  . Coronary atherosclerosis of unspecified type of vessel, native or  graft 05/10/2014  . HLD (hyperlipidemia) 04/12/2014  . Dyspnea 02/18/2014  . Chest pain 02/18/2014  . Acute on chronic systolic heart failure ([place]) 02/18/2014  . Type II or unspecified type diabetes mellitus without mention of complication, uncontrolled 01/15/2014  . Unspecified essential hypertension 01/15/2014  . Tobacco use disorder 01/15/2014  . SBO (small bowel obstruction) ([place]) 01/13/2014  . Protein-calorie malnutrition, severe ([place]) 10/16/2013  . Mass of left adrenal gland ([place]) 10/16/2013  . Dehydration 10/15/2013  . Gastroenteritis, acute 10/15/2013  . Hyperglycemia 10/15/2013  . Hyponatremia 10/15/2013  . Diabetes mellitus, type 2 ([place]) 10/15/2013  . Hypertension 10/15/2013  . Hypokalemia 10/15/2013    Past Surgical History:  Procedure Laterality Date  . CARDIAC CATHETERIZATION    . HERNIA REPAIR    . LAPAROTOMY N/A 01/15/2014   Procedure: EXPLORATORY LAPAROTOMY SMALL BOWEL RESECTION;  Surgeon: Imogene Burn. Georgette Dover, MD;  Location: [facility];  Service: General;  Laterality: N/A;  . LEFT HEART CATHETERIZATION WITH CORONARY ANGIOGRAM N/A 02/19/2014   Procedure: LEFT HEART CATHETERIZATION WITH CORONARY ANGIOGRAM;  Surgeon: Troy Sine, MD;  Location: [facility] CATH LAB;  Service: Cardiovascular;  Laterality: N/A;  . LYSIS OF ADHESION N/A 01/15/2014   Procedure: LYSIS OF ADHESION;  Surgeon: Imogene Burn. Georgette Dover, MD;  Location: [facility] OR;  Service: General;  Laterality: N/A;  . TUBAL LIGATION      OB History    Gravida Para Term Preterm AB Living   3 3 3     3    SAB TAB Ectopic  Multiple Live Births                   Home Medications    Prior to Admission medications   Medication Sig Start Date End Date Taking? Authorizing Provider  amLODipine (NORVASC) 10 MG tablet Take 1 tablet (10 mg total) by mouth daily. 10/06/16   [name], MD  aspirin 81 MG tablet Take 81 mg by mouth daily.    [provider]  atorvastatin (LIPITOR) 40 MG tablet Take 1 tablet (40 mg total) by mouth  daily. 10/06/16   [name], MD  BREO ELLIPTA 200-25 MCG/INH AEPB Inhale 1 puff into the lungs daily as needed. 09/29/16   [provider]  carvedilol (COREG) 12.5 MG tablet TAKE 1 TABLET BY MOUTH 2 TIMES DAILY. 08/19/16   [name], MD  furosemide (LASIX) 40 MG tablet Take 1 tablet (40 mg total) by mouth daily. 10/06/16   [name], MD  glimepiride (AMARYL) 4 MG tablet Take 1 tablet (4 mg total) by mouth daily before breakfast. 03/26/16   Jegede, Olugbemiga E, MD  glucose blood (ACCU-CHEK AVIVA PLUS) test strip USE TO TEST BLOOD SUGARS AS DIRECTED BY PHYSICIAN 04/10/16   Tresa Garter, MD  Insulin Glargine (LANTUS) 100 UNIT/ML Solostar Pen Inject 40 Units into the skin daily at 10 pm. 03/26/16   Tresa Garter, MD  insulin lispro (HUMALOG) 100 UNIT/ML KiwkPen Inject 0.18 mLs (18 Units total) into the skin 3 (three) times daily. Administer subQ within 15 minutes before or immediately after a meal 05/12/16   Jegede, Olugbemiga E, MD  lisinopril (PRINIVIL,ZESTRIL) 20 MG tablet Take 1 tablet (20 mg total) by mouth daily. 10/06/16   [name], MD  metFORMIN (GLUCOPHAGE-XR) 500 MG 24 hr tablet Take 1,000 mg by mouth 2 (two) times daily. 08/24/16   [provider]  TRUEPLUS LANCETS 28G MISC USE TO TEST BLOOD SUGARS AS DIRECTED BY PHYSICIAN 01/14/15   Advani, Vernon Prey, MD  ULTICARE MICRO PEN NEEDLES 32G X 4 MM MISC USE 4 TIMES DAILY TO INJECT INSULIN 12/31/15   Tresa Garter, MD    Family History Family History  Problem Relation Age of Onset  . Diabetes Mellitus II Mother   . Hypertension Mother   . CVA Mother   . Stroke Mother   . Heart failure Brother   . Heart attack Neg Hx     Social History Social History  Substance Use Topics  . Smoking status: Current Every Day Smoker    Packs/day: 0.50    Years: 25.00    Types: Cigarettes  . Smokeless tobacco: Never Used     Comment: smoking 1 ppd  . Alcohol use No     Allergies   Other   Review  of Systems Review of Systems  Constitutional: Negative for fever.  Cardiovascular: Negative for chest pain.  Gastrointestinal: Positive for abdominal pain, diarrhea, nausea and vomiting.  All other systems reviewed and are negative.    Physical Exam Updated Vital Signs BP (!) 162/86 (BP Location: Right Arm)   Pulse 88   Temp 98.5 F (36.9 C) (Oral)   LMP  (LMP Unknown)   SpO2 99%   Physical Exam CONSTITUTIONAL: Chronically ill appearing, writhing in pain HEAD: Normocephalic/atraumatic EYES: EOMI/PERRL, mild icterus noted ENMT: Mucous membranes moist NECK: supple no meningeal signs SPINE/BACK:entire spine nontender CV: S1/S2 noted, no murmurs/rubs/gallops noted LUNGS: Lungs are clear to auscultation bilaterally ABDOMEN: soft, moderate tenderness to RUQ/RLQ,  bowel sounds noted throughout abdomen GU:no cva tenderness NEURO: Pt is awake/alert/appropriate, moves all extremitiesx4.  No facial droop.   EXTREMITIES: pulses normal/equal, full ROM SKIN: warm, color normal PSYCH: mildly anxious  ED Treatments / Results  Labs (all labs ordered are listed, but only abnormal results are displayed) Labs Reviewed  COMPREHENSIVE METABOLIC PANEL  CBC WITH DIFFERENTIAL/PLATELET  LIPASE, BLOOD  URINALYSIS, ROUTINE W REFLEX MICROSCOPIC  PROTIME-INR    EKG  EKG Interpretation  Date/Time:  Sarr July 30 2017 06:32:31 EDT Ventricular Rate:  91 PR Interval:    QRS Duration: 86 QT Interval:  374 QTC Calculation: 461 R Axis:   -37 Text Interpretation:  Sinus rhythm Biatrial enlargement Low voltage, precordial leads LVH with secondary repolarization abnormality Anterior Q waves, possibly due to LVH Abnormal ekg Confirmed by Ripley Fraise [phone]) on 07/30/2017 6:35:14 AM       Radiology No results found.  Procedures Procedures (including critical care time)  Medications Ordered in ED Medications  fentaNYL (SUBLIMAZE) injection 50 mcg (50 mcg Intravenous Given 07/30/17  0652)  ondansetron (ZOFRAN) injection 4 mg (4 mg Intravenous Given 07/30/17 0349)     Initial Impression / Assessment and Plan / ED Course  I have reviewed the triage vital signs and the nursing notes.  Pertinent labs & imaging results that were available during my care of the patient were reviewed by me and considered in my medical decision making (see chart for details).     6:36 AM Pt reportedly had [name] yesterday that revealed cholelithiasis Currently these records are unavailable She has tenderness in RUQ but also RLQ and h/o bowel resection She may need CT imaging Will check labs, treat pain and reassess 7:03 AM Pt reports she at first thought this was recurrent SBO On exam, she is still tender, though mostly in RUQ She has decreased bowel sounds Due to h/o SBO with resection, will perform CT to r/o any other acute abdominal emergencies  At signout to dr Wilson Singer, f/u on CT imaging   Final Clinical Impressions(s) / ED Diagnoses   Final diagnoses:  None    New Prescriptions New Prescriptions   No medications on file     Ripley Fraise, MD 07/30/17 [phone]

## 2017-07-30 NOTE — ED Provider Notes (Addendum)
I assumed care at change of shift. CT is significant for calculus cholecystitis. Rocephin/Flagyl. She is afebrile but does have a leukocytosis. When I updated her she additionally complained of rigors but doesn't appear toxic. Blood cultures were added. Increased anion gap. Lactic acid ordered.  She concurrently appears to have a urinary tract infection which should be covered by the ceftriaxone. Urine culture. Presumably acute kidney injury/dehydration with elevated BUN/creatinine and hyponatremia. IV fluids. Surgical consultation. Will need to be admitted.   Virgel Manifold, MD 07/30/17 0914  9:41 AM Discussed with Dr Barry Dienes, surgery. With metabolic derangement to this degree, is requesting medical admission. Will see in consultation. No surgery planned for today. Unassigned medical admit.     Virgel Manifold, MD 07/30/17 [phone]

## 2017-07-30 NOTE — ED Notes (Signed)
Bedside commode at the bedside. Pt reports 0/10 pain at this time. Reports "feeling much better." A&Ox4.
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
## 2017-07-30 NOTE — Consult Note (Signed)
Reason for Consult:  Cholecystitis/cholelithiasis CC: abdominal pain right side with nausea, vomiting and diarrhea Referring Physician: Joniqua Sidle [name] is an 61 y.o. female.  HPI: Pt reported onset of symptoms 07/26/17. It started with nausea and vomiting.  She reports pain came on at the same time her nausea and vomiting did. She's been unable to eat or drink much at all since onset of symptoms on 07/26/17. She also reports that her urine has had a foul odor during that time. She denies dysuria or urinary frequency. She was seen by PCP yesterday, and ultrasound showed Gallstones.  Symptoms became worse over the PM and she presents to the ED this AM.  Work up shows she is afebrile, BP up some, mild tachycardia.  Na 122, K+ 3.1, Glucose is 424, Creatinine is 1.65.  Alk phos 235, AST 155, ALT 587, bilirubin 6.1.  WBC 17.7, INR 1.23, probable UTI.  CT scan shows:  Gallbladder wall appears thickened with pericholecystic edema. There is a tiny gallstone in the gallbladder. Gallbladder is mildly distended. There is no appreciable biliary duct dilatation.  Pancreas: No pancreatic mass or inflammatory focus.  Extensive air noted in urinary bladder. If patient has not been recently instrumented, this finding raises concern for gas-forming  infectious organism within the urine in the urinary bladder. We are ask to see.   Past Medical History:  Diagnosis Date  . Chronic systolic dysfunction of left ventricle   . Diabetes mellitus, type II ([place])   . H/O: CVA (cerebrovascular accident)    6 strokes, most recent on 05/10/13  . Hyperlipidemia   . Hypertension   . NICM (nonischemic cardiomyopathy) ([place])    Echo (05/2014):  Mod LVH, EF 20-25%, inf HK, mild MR, mild LAE.  Marland Kitchen Noncompliance   . Tobacco abuse     Past Surgical History:  Procedure Laterality Date  . CARDIAC CATHETERIZATION    . HERNIA REPAIR    . LAPAROTOMY N/A 01/15/2014   Procedure: EXPLORATORY LAPAROTOMY SMALL BOWEL RESECTION;  Surgeon:  Imogene Burn. Georgette Dover, MD;  Location: [facility];  Service: General;  Laterality: N/A;  . LEFT HEART CATHETERIZATION WITH CORONARY ANGIOGRAM N/A 02/19/2014   Procedure: LEFT HEART CATHETERIZATION WITH CORONARY ANGIOGRAM;  Surgeon: Troy Sine, MD;  Location: [facility] CATH LAB;  Service: Cardiovascular;  Laterality: N/A;  . LYSIS OF ADHESION N/A 01/15/2014   Procedure: LYSIS OF ADHESION;  Surgeon: Imogene Burn. Georgette Dover, MD;  Location: [facility] OR;  Service: General;  Laterality: N/A;  . TUBAL LIGATION      Family History  Problem Relation Age of Onset  . Diabetes Mellitus II Mother   . Hypertension Mother   . CVA Mother   . Stroke Mother   . Heart failure Brother   . Heart attack Neg Hx     Social History:  reports that she has been smoking Cigarettes.  She has a 12.50 pack-year smoking history. She has never used smokeless tobacco. She reports that she does not drink alcohol or use drugs.  Allergies:  Allergies  Allergen Reactions  . Other Other (See Comments)    Told not to take any dairy products-per MD    Prior to Admission medications   Medication Sig Start Date End Date Taking? Authorizing Provider  amLODipine (NORVASC) 10 MG tablet Take 1 tablet (10 mg total) by mouth daily. 10/06/16  Yes [name], MD  aspirin 81 MG tablet Take 81 mg by mouth daily.   Yes [provider]  atorvastatin (LIPITOR) 40 MG tablet Take 1 tablet (40 mg total) by mouth daily. 10/06/16  Yes Skains, Thana Farr, MD  BYDUREON 2 MG SRER Inject 1 mL into the skin once a week. Saturdays  07/22/17  Yes [provider]  carvedilol (COREG) 12.5 MG tablet TAKE 1 TABLET BY MOUTH 2 TIMES DAILY. 08/19/16  Yes [name], MD  furosemide (LASIX) 40 MG tablet Take 1 tablet (40 mg total) by mouth daily. 10/06/16  Yes [name], MD  glimepiride (AMARYL) 4 MG tablet Take 1 tablet (4 mg total) by mouth daily before breakfast. 03/26/16  Yes Jegede, Olugbemiga E, MD  glucose blood (ACCU-CHEK AVIVA PLUS) test strip USE TO  TEST BLOOD SUGARS AS DIRECTED BY PHYSICIAN 04/10/16  Yes Jegede, Marlena Clipper, MD  Insulin Glargine (LANTUS) 100 UNIT/ML Solostar Pen Inject 40 Units into the skin daily at 10 pm. Patient taking differently: Inject 50 Units into the skin daily at 10 pm.  03/26/16  Yes Jegede, Olugbemiga E, MD  JANUVIA 100 MG tablet Take 100 mg by mouth daily. 07/12/17  Yes [provider]  lisinopril (PRINIVIL,ZESTRIL) 20 MG tablet Take 1 tablet (20 mg total) by mouth daily. 10/06/16  Yes [name], MD  metFORMIN (GLUCOPHAGE-XR) 500 MG 24 hr tablet Take 1,000 mg by mouth 2 (two) times daily. 08/24/16  Yes [provider]  TRUEPLUS LANCETS 28G MISC USE TO TEST BLOOD SUGARS AS DIRECTED BY PHYSICIAN 01/14/15  Yes Advani, Deepak, MD  ULTICARE MICRO PEN NEEDLES 32G X 4 MM MISC USE 4 TIMES DAILY TO INJECT INSULIN 12/31/15  Yes Jegede, Olugbemiga E, MD  insulin lispro (HUMALOG) 100 UNIT/ML KiwkPen Inject 0.18 mLs (18 Units total) into the skin 3 (three) times daily. Administer subQ within 15 minutes before or immediately after a meal Patient not taking: Reported on 07/30/2017 05/12/16   Tresa Garter, MD     Results for orders placed or performed during the hospital encounter of 07/30/17 (from the past 48 hour(s))  Comprehensive metabolic panel     Status: Abnormal   Collection Time: 07/30/17  6:34 AM  Result Value Ref Range   Sodium 122 (L) 135 - 145 mmol/L   Potassium 3.1 (L) 3.5 - 5.1 mmol/L   Chloride 81 (L) 101 - 111 mmol/L   CO2 25 22 - 32 mmol/L   Glucose, Bld 424 (H) 65 - 99 mg/dL   BUN 39 (H) 6 - 20 mg/dL   Creatinine, Ser 1.65 (H) 0.44 - 1.00 mg/dL   Calcium 7.5 (L) 8.9 - 10.3 mg/dL   Total Protein 7.0 6.5 - 8.1 g/dL   Albumin 2.9 (L) 3.5 - 5.0 g/dL   AST 155 (H) 15 - 41 U/L   ALT 587 (H) 14 - 54 U/L   Alkaline Phosphatase 235 (H) 38 - 126 U/L   Total Bilirubin 6.1 (H) 0.3 - 1.2 mg/dL   GFR calc non Af Amer 33 (L) >60 mL/min   GFR calc Af Amer 38 (L) >60 mL/min    Comment:  (NOTE) The eGFR has been calculated using the CKD EPI equation. This calculation has not been validated in all clinical situations. eGFR's persistently <60 mL/min signify possible Chronic Kidney Disease.    Anion gap 16 (H) 5 - 15  CBC with Differential/Platelet     Status: Abnormal   Collection Time: 07/30/17  6:34 AM  Result Value Ref Range   WBC 17.7 (H) 4.0 - 10.5 K/uL   RBC 4.99 3.87 -  5.11 MIL/uL   Hemoglobin 13.1 12.0 - 15.0 g/dL   HCT 38.0 36.0 - 46.0 %   MCV 76.2 (L) 78.0 - 100.0 fL   MCH 26.3 26.0 - 34.0 pg   MCHC 34.5 30.0 - 36.0 g/dL   RDW 14.1 11.5 - 15.5 %   Platelets 214 150 - 400 K/uL   Neutrophils Relative % 93 %   Neutro Abs 16.5 (H) 1.7 - 7.7 K/uL   Lymphocytes Relative 3 %   Lymphs Abs 0.5 (L) 0.7 - 4.0 K/uL   Monocytes Relative 4 %   Monocytes Absolute 0.7 0.1 - 1.0 K/uL   Eosinophils Relative 0 %   Eosinophils Absolute 0.0 0.0 - 0.7 K/uL   Basophils Relative 0 %   Basophils Absolute 0.0 0.0 - 0.1 K/uL  Lipase, blood     Status: None   Collection Time: 07/30/17  6:34 AM  Result Value Ref Range   Lipase 30 11 - 51 U/L  Protime-INR     Status: Abnormal   Collection Time: 07/30/17  6:34 AM  Result Value Ref Range   Prothrombin Time 15.4 (H) 11.4 - 15.2 seconds   INR 1.23   Urinalysis, Routine w reflex microscopic     Status: Abnormal   Collection Time: 07/30/17  8:10 AM  Result Value Ref Range   Color, Urine AMBER (A) YELLOW    Comment: BIOCHEMICALS MAY BE AFFECTED BY COLOR   APPearance CLOUDY (A) CLEAR   Specific Gravity, Urine 1.014 1.005 - 1.030   pH 5.0 5.0 - 8.0   Glucose, UA >=500 (A) NEGATIVE mg/dL   Hgb urine dipstick MODERATE (A) NEGATIVE   Bilirubin Urine NEGATIVE NEGATIVE   Ketones, ur 5 (A) NEGATIVE mg/dL   Protein, ur 100 (A) NEGATIVE mg/dL   Nitrite NEGATIVE NEGATIVE   Leukocytes, UA TRACE (A) NEGATIVE   RBC / HPF 0-5 0 - 5 RBC/hpf   WBC, UA 6-30 0 - 5 WBC/hpf   Bacteria, UA MANY (A) NONE SEEN   Squamous Epithelial / LPF 0-5 (A)  NONE SEEN    Ct Abdomen Pelvis Wo Contrast  Result Date: 07/30/2017 CLINICAL DATA:  Upper abdominal pain with nausea and vomiting EXAM: CT ABDOMEN AND PELVIS WITHOUT CONTRAST TECHNIQUE: Multidetector CT imaging of the abdomen and pelvis was performed following the standard protocol without oral or intravenous contrast maternal administration. COMPARISON:  January 12, 2014 FINDINGS: Lower chest: There is bibasilar atelectatic change. There are foci of coronary artery calcification evident. Hepatobiliary: Liver measures 19.5 cm in length. No focal liver lesions are evident on this noncontrast enhanced study. Gallbladder wall appears thickened with pericholecystic edema. There is a tiny gallstone in the gallbladder. Gallbladder is mildly distended. There is no appreciable biliary duct dilatation. Pancreas: No pancreatic mass or inflammatory focus. Spleen: No splenic lesions are evident. Adrenals/Urinary Tract: There is slight left adrenal hypertrophy. Right adrenal appears normal. Kidneys bilaterally show no evident mass or hydronephrosis on either side. There is no renal or ureteral calculus on either side. Urinary bladder wall is normal in thickness. However, there is extensive air within the urinary bladder. Stomach/Bowel: There are scattered left-sided colonic diverticula without diverticulitis. There is no bowel wall or mesenteric thickening. No bowel obstruction. No free air or portal venous air. Vascular/Lymphatic: There is atherosclerotic calcification in the aorta and iliac arteries. There are patchy areas of atherosclerotic calcification in the major mesenteric artery vessels. The major mesenteric arterial vessels appear patent on this noncontrast enhanced study. There is no adenopathy  appreciable in the abdomen or pelvis. Reproductive: Uterus is anteverted.  No pelvic mass is evident. Other: Appendix appears normal. There is no abscess or ascites in the abdomen or pelvis. There is thinning of the rectus  muscle in the midline. Fat extends into this area of thinning, but no bowel extends into this area. Musculoskeletal: There is degenerative change in the visualized thoracic and lumbar spine regions. No blastic or lytic bone lesions evident. There is no intramuscular or abdominal wall lesion. IMPRESSION: 1. Findings felt to represent a degree of acute cholecystitis. Gallbladder appears mildly distended with wall thickening and pericholecystic edema. Tiny gallstone noted. No appreciable biliary duct dilatation. 2. Extensive air noted in urinary bladder. If patient has not been recently instrumented, this finding raises concern for gas-forming infectious organism within the urine in the urinary bladder. 3. Left-sided colonic diverticulosis without diverticulitis. No bowel obstruction or bowel wall thickening. Appendix appears normal. No abscess in the abdomen or pelvis. 4. No renal or ureteral calculus on either side. No hydronephrosis. 5.  Midline rectus wall thickening. 6. Aortoiliac atherosclerosis. Patchy calcification in the proximal mesenteric arterial vessels noted. Foci of coronary artery calcification also noted. Aortic Atherosclerosis (ICD10-I70.0). Electronically Signed   By: Lowella Grip III M.D.   On: 07/30/2017 08:59    Review of Systems  Constitutional: Positive for malaise/fatigue and weight loss. Negative for chills and fever.  HENT: Positive for hearing loss (she thinks it is decreased, but she heard me fine in room during exam.).   Eyes: Negative.   Respiratory: Positive for shortness of breath (DOE). Negative for cough, hemoptysis, sputum production and wheezing.   Cardiovascular: Negative for chest pain, palpitations, orthopnea, claudication, leg swelling and PND.  Gastrointestinal: Positive for abdominal pain (Pain is on the right side), diarrhea, nausea and vomiting. Negative for blood in stool, constipation, heartburn and melena.  Genitourinary:       Odor for a few day, no pain  with urination.  Musculoskeletal: Negative.   Skin: Negative.   Neurological: Positive for dizziness (last few days, she has not been able to eat since 9/3).  Endo/Heme/Allergies: Negative.   Psychiatric/Behavioral: Negative.    Blood pressure (!) 168/88, pulse 98, temperature 98.5 F (36.9 C), temperature source Oral, resp. rate (!) 23, SpO2 100 %. Physical Exam  Constitutional: She is oriented to person, place, and time. She appears well-developed and well-nourished. No distress.  HENT:  Head: Normocephalic and atraumatic.  Mouth/Throat: No oropharyngeal exudate.  Eyes: Right eye exhibits no discharge. Left eye exhibits no discharge. No scleral icterus.  Pupils are equal  Neck: Normal range of motion. Neck supple. No JVD present. No tracheal deviation present. No thyromegaly present.  Cardiovascular: Normal rate, regular rhythm, normal heart sounds and intact distal pulses.   No murmur heard. Respiratory: Effort normal and breath sounds normal. No respiratory distress. She has no wheezes. She has no rales. She exhibits no tenderness.  GI: Soft. She exhibits no distension and no mass. There is tenderness (right side is where the pain is, worse RUQ). There is no rebound and no guarding.  Musculoskeletal: She exhibits no edema or tenderness.  Lymphadenopathy:    She has no cervical adenopathy.  Neurological: She is alert and oriented to person, place, and time. No cranial nerve deficit.  Skin: Skin is warm and dry. No rash noted. She is not diaphoretic. No erythema. No pallor.  Psychiatric: She has a normal mood and affect. Her behavior is normal. Judgment and thought content normal.  Anti-infectives    Start     Dose/Rate Route Frequency Ordered Stop   07/30/17 0915  cefTRIAXone (ROCEPHIN) 1 g in dextrose 5 % 50 mL IVPB     1 g 100 mL/hr over 30 Minutes Intravenous  Once 07/30/17 0902     07/30/17 0915  metroNIDAZOLE (FLAGYL) IVPB 500 mg     500 mg 100 mL/hr over 60 Minutes  Intravenous  Once 07/30/17 0902        Assessment/Plan: Acute cholecystitis/cholelithiasis. Urinary tract infection. Hyponatremia,Hypokalemia, Dehydration DKA,Glucose 424,lactate2.34 Acute kidney injury Acute LFT elevation Nonischemic cardiomyopathy with EF of 2025%/mild MR. Type 2 diabetes - poor control Hypertension History of tobacco use 40 years History of expiratory laparotomy small bowel resection/LOA 01/15/14   Plan: Agree with admission by medicine. Medical management of her acute issues above. She's been started on Rocephin and Flagyl, for her UTI and cholecystitis. At this point I think she requires resuscitation, with correction of her electrolytes and glucose. I would let her have ice chips and sips at least today. Recheck labs, make her nothing by mouth after midnight, and reassess for possible surgery tomorrow. Our recommendations for high risk - severe cholecystitis include: cefepime/Flagyl, or Zosyn.   Joshuah Minella 07/30/2017, 9:41 AM

## 2017-07-30 NOTE — ED Notes (Signed)
LAb coming to draw cultures

## 2017-07-30 NOTE — Progress Notes (Signed)
1715 received pt from ED via stretcher, A&Ox4. Denies abdominal pain at this time, no N/V.

## 2017-07-30 NOTE — H&P (Signed)
Date: 07/30/2017               Patient Name:  Alicia [name] MRN: [id number]  DOB: [birth year] Age / Sex: 61 y.o., female   PCP: Patient, No Pcp Per         Medical Service: Internal Medicine Teaching Service         Attending Physician: Dr. Annia Belt, MD    First Contact: Dr. Tarri Abernethy Pager: [phone]  Second Contact: Dr. Posey Pronto Pager: [phone]       After Hours (After 5p/  First Contact Pager: [phone]  weekends / holidays): Second Contact Pager: [phone]   Chief Complaint: RUQ abdominal pain, N/V, and diarrhea  History of Present Illness: Ms.Deist is a 61 y.o. Female with a PMHx significant for Type 2 DM who presentedd to the ED with progressive RUQ abdominal pain, N/V, and diarrhea of 4 days duration. She was evaluated by her PCP who preformed a right upper quadrant ultrasound an noticed non-obstructing gall stones. No further management was pursued. The abdominal pain, N/V, and diarrhea continued to progress, prompting her to seek further evaluation. She has been unable to tolerate any oral medication since the initiation of her symptoms but states that she has been taking her basal insulin. She does not noticed a correlation between the pain and eating. Never had similar symptoms in the past. The pain is better when she is not moving and extremely worse with movement or riding in a care. She denies fevers, chills, dysuria, urinary frequency. She endorses passing gas with urination and foul smelling urine. No history of IBD.   ED Course: On admission she was found to be hypertensive, tachycardic, and afebrile. Initial labs indicated Na 122, K 3.1, glucose 424, AG 16, Cr 1.65, elevated LFTs, and a leukocytosis. CT abdomen illustrated gallbladder distention and wall thickening with pericholecystic edema, gallstones, and no ductal dilation. The CT abdomen also showed extensive air in the bladder. Surgery was consulted and she was started on IVF and Rocephin/Flagyl.   Meds:    Current Meds  Medication Sig  . amLODipine (NORVASC) 10 MG tablet Take 1 tablet (10 mg total) by mouth daily.  Marland Kitchen aspirin 81 MG tablet Take 81 mg by mouth daily.  Marland Kitchen atorvastatin (LIPITOR) 40 MG tablet Take 1 tablet (40 mg total) by mouth daily.  Marland Kitchen BYDUREON 2 MG SRER Inject 1 mL into the skin once a week. Saturdays   . carvedilol (COREG) 12.5 MG tablet TAKE 1 TABLET BY MOUTH 2 TIMES DAILY.  . furosemide (LASIX) 40 MG tablet Take 1 tablet (40 mg total) by mouth daily.  Marland Kitchen glimepiride (AMARYL) 4 MG tablet Take 1 tablet (4 mg total) by mouth daily before breakfast.  . glucose blood (ACCU-CHEK AVIVA PLUS) test strip USE TO TEST BLOOD SUGARS AS DIRECTED BY PHYSICIAN  . Insulin Glargine (LANTUS) 100 UNIT/ML Solostar Pen Inject 40 Units into the skin daily at 10 pm. (Patient taking differently: Inject 50 Units into the skin daily at 10 pm. )  . JANUVIA 100 MG tablet Take 100 mg by mouth daily.  Marland Kitchen lisinopril (PRINIVIL,ZESTRIL) 20 MG tablet Take 1 tablet (20 mg total) by mouth daily.  . metFORMIN (GLUCOPHAGE-XR) 500 MG 24 hr tablet Take 1,000 mg by mouth 2 (two) times daily.  . TRUEPLUS LANCETS 28G MISC USE TO TEST BLOOD SUGARS AS DIRECTED BY PHYSICIAN  . ULTICARE MICRO PEN NEEDLES 32G X 4 MM MISC USE 4 TIMES DAILY TO INJECT INSULIN  Allergies: Allergies as of 07/30/2017 - Review Complete 07/30/2017  Allergen Reaction Noted  . Other Other (See Comments) 01/12/2014   Past Medical History:  Diagnosis Date  . Cerebrovascular Accidents (CVA) 05/10/2013   6 strokes, most recent on 05/10/13  . 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 Diabetes Mellitus, Type 2 10/15/2013  . Hyperlipidemia   . Hypertension   . 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  Marland Kitchen Small Bowel Obstruction (SBO) 01/13/2014   Ex-Lap & Lysis of  Adhesion, 01/15/2014  . Uterine Prolapse, First Degree 02/12/2016   Family History: Negative for IBD Mother: +CVA  Social History:  Smokes 1PPD, states she quit last week  Denies the use of alcohol or illicit drugs  Review of Systems: A complete ROS was negative except as per HPI.  Physical Exam: Blood pressure (!) 155/77, pulse 94, temperature 98.5 F (36.9 C), temperature source Oral, resp. rate (!) 26, SpO2 97 %.  Physical Exam  Constitutional: She is oriented to person, place, and time. She appears well-developed and well-nourished. No distress.  HENT:  Oropharynx and oral mucosa dry, cracked lips  Eyes: Pupils are equal, round, and reactive to light. Scleral icterus is present.  Cardiovascular: Normal rate, regular rhythm, normal heart sounds and intact distal pulses.   Pulmonary/Chest: Effort normal and breath sounds normal. She has no wheezes. She has no rales.  Abdominal: Soft.  Hypoactive bowel sounds, tenderness to palpation of the RUQ, rebound tenderness present  Musculoskeletal: Normal range of motion. She exhibits no edema.  Neurological: She is alert and oriented to person, place, and time.  Skin:  Multiple hyperpigmented plaques with lichenification on the extensor surface of the hands, elbow, achilles, and knees   EKG: personally reviewed: my interpretation is sinus rhythm, left axis deviation, and peaked P waves indicative of RAE  CT Abdomen  IMPRESSION: 1. Findings felt to represent a degree of acute cholecystitis. Gallbladder appears mildly distended with wall thickening and pericholecystic edema. Tiny gallstone noted. No appreciable biliary duct dilatation. 2. Extensive air noted in urinary bladder. If patient has not been recently instrumented, this finding raises concern for gas-forming infectious organism within the urine in the urinary bladder. 3. Left-sided colonic diverticulosis without diverticulitis. No bowel obstruction or bowel wall  thickening. Appendix appears normal. No abscess in the abdomen or pelvis. 4. No renal or ureteral calculus on either side. No hydronephrosis. 5.  Midline rectus wall thickening. 6. Aortoiliac atherosclerosis. Patchy calcification in the proximal mesenteric arterial vessels noted. Foci of coronary artery calcification also noted.  Assessment & Plan by Problem: Principal Problem:   Cholelithiasis with cholecystitis Active Problems:   Diabetes Mellitus, Type 2   Hypertension   Chronic Systolic Heart Failure  1. Acute Cholecystitis  - RUQ abdominal pain, N/V, and leukocytosis  - No fevers, AMS, or hypotension  - Labs indicative of obstructive process with elevation in ALT, AST, Alk Phos, and Bilirubin  - CT abdomen illustrated gallbladder distention and wall thickening with pericholecystic edema, gallstones, and no ductal dilation. - Surgery consulted; NPO at midnight for possible surgery tomorrow, holding Lovenox at midnight  - Continue antibiotic coverage with Rocephin and Flagyl   2. Urinary Tract Infection/ Pneumaturia  - Denies symptoms of dysuria or increased frequency  - Acknowledges "passing gas" with urination  -  UA significant for multiple bacteria, moderate hgb, and trace leukocytes  - Most common cause for pneumaturia is a colovesical fistula however, it can be caused by gas producing bacteria  - Adequate coverage with Rocephin and Flagyl  - Urine cultures pending   3. Hyperglycemia  - Has not been taking her meal time insulin  - CBG 424 on admission  - AG 16, with ketones in the urine  - Associated hypokalemia and hyponatremia  - IVF + K  4. Acute Kidney Injury  - Creatinine 1.65 on admission  - Baseline 0.6-0.7  - Appears volume down on PE  - IVF with recheck CMP tomorrow   5. Hyponatremia  - Na corrects to 127 with glucose of 424  - Was receiving IVF in the ED  - Will recheck BMP    Diet: Carb modified, NPO at midnight  VTE ppx: Lovenox  Code Status:  Full   Dispo: Admit patient to Inpatient with expected length of stay greater than 2 midnights.  Signed: Ina Homes, MD 07/30/2017, 1:19 PM  My Pager: [phone]

## 2017-07-30 NOTE — ED Notes (Signed)
CMP hemolyzed, new test ordered

## 2017-07-31 ENCOUNTER — Inpatient Hospital Stay (HOSPITAL_COMMUNITY): Payer: Medicare Other

## 2017-07-31 DIAGNOSIS — R3989 Other symptoms and signs involving the genitourinary system: Secondary | ICD-10-CM

## 2017-07-31 DIAGNOSIS — I110 Hypertensive heart disease with heart failure: Secondary | ICD-10-CM

## 2017-07-31 DIAGNOSIS — Z794 Long term (current) use of insulin: Secondary | ICD-10-CM

## 2017-07-31 DIAGNOSIS — K8001 Calculus of gallbladder with acute cholecystitis with obstruction: Secondary | ICD-10-CM

## 2017-07-31 DIAGNOSIS — Z8673 Personal history of transient ischemic attack (TIA), and cerebral infarction without residual deficits: Secondary | ICD-10-CM

## 2017-07-31 DIAGNOSIS — I428 Other cardiomyopathies: Secondary | ICD-10-CM

## 2017-07-31 DIAGNOSIS — E1110 Type 2 diabetes mellitus with ketoacidosis without coma: Secondary | ICD-10-CM

## 2017-07-31 LAB — BLOOD CULTURE ID PANEL (REFLEXED)
Acinetobacter baumannii: NOT DETECTED
Candida albicans: NOT DETECTED
Candida glabrata: NOT DETECTED
Candida krusei: NOT DETECTED
Candida parapsilosis: NOT DETECTED
Candida tropicalis: NOT DETECTED
Carbapenem resistance: NOT DETECTED
Enterobacter cloacae complex: NOT DETECTED
Enterobacteriaceae species: DETECTED — AB
Enterococcus species: NOT DETECTED
Escherichia coli: NOT DETECTED
Haemophilus influenzae: NOT DETECTED
Klebsiella oxytoca: NOT DETECTED
Klebsiella pneumoniae: DETECTED — AB
Listeria monocytogenes: NOT DETECTED
Methicillin resistance: NOT DETECTED
Neisseria meningitidis: NOT DETECTED
Proteus species: NOT DETECTED
Pseudomonas aeruginosa: NOT DETECTED
Serratia marcescens: NOT DETECTED
Staphylococcus aureus (BCID): NOT DETECTED
Staphylococcus species: NOT DETECTED
Streptococcus agalactiae: NOT DETECTED
Streptococcus pneumoniae: NOT DETECTED
Streptococcus pyogenes: NOT DETECTED
Streptococcus species: NOT DETECTED
Vancomycin resistance: NOT DETECTED

## 2017-07-31 LAB — CBC
HCT: 31.2 % — ABNORMAL LOW (ref 36.0–46.0)
Hemoglobin: 10.5 g/dL — ABNORMAL LOW (ref 12.0–15.0)
MCH: 25.6 pg — AB (ref 26.0–34.0)
MCHC: 33.7 g/dL (ref 30.0–36.0)
MCV: 76.1 fL — AB (ref 78.0–100.0)
PLATELETS: 199 10*3/uL (ref 150–400)
RBC: 4.1 MIL/uL (ref 3.87–5.11)
RDW: 14.1 % (ref 11.5–15.5)
WBC: 11.5 10*3/uL — AB (ref 4.0–10.5)

## 2017-07-31 LAB — MAGNESIUM: MAGNESIUM: 1.7 mg/dL (ref 1.7–2.4)

## 2017-07-31 LAB — COMPREHENSIVE METABOLIC PANEL
ALT: 283 U/L — ABNORMAL HIGH (ref 14–54)
AST: 59 U/L — ABNORMAL HIGH (ref 15–41)
Albumin: 2.1 g/dL — ABNORMAL LOW (ref 3.5–5.0)
Alkaline Phosphatase: 174 U/L — ABNORMAL HIGH (ref 38–126)
Anion gap: 10 (ref 5–15)
BUN: 22 mg/dL — ABNORMAL HIGH (ref 6–20)
CO2: 20 mmol/L — ABNORMAL LOW (ref 22–32)
Calcium: 6.5 mg/dL — ABNORMAL LOW (ref 8.9–10.3)
Chloride: 101 mmol/L (ref 101–111)
Creatinine, Ser: 0.9 mg/dL (ref 0.44–1.00)
GFR calc Af Amer: >60 mL/min (ref >60)
GFR calc non Af Amer: >60 mL/min (ref >60)
Glucose, Bld: 170 mg/dL — ABNORMAL HIGH (ref 65–99)
Potassium: 3.2 mmol/L — ABNORMAL LOW (ref 3.5–5.1)
Sodium: 131 mmol/L — ABNORMAL LOW (ref 135–145)
Total Bilirubin: 2.5 mg/dL — ABNORMAL HIGH (ref 0.3–1.2)
Total Protein: 5.5 g/dL — ABNORMAL LOW (ref 6.5–8.1)

## 2017-07-31 LAB — GLUCOSE, CAPILLARY
GLUCOSE-CAPILLARY: 294 mg/dL — AB (ref 65–99)
Glucose-Capillary: 170 mg/dL — ABNORMAL HIGH (ref 65–99)
Glucose-Capillary: 249 mg/dL — ABNORMAL HIGH (ref 65–99)
Glucose-Capillary: 410 mg/dL — ABNORMAL HIGH (ref 65–99)

## 2017-07-31 LAB — SURGICAL PCR SCREEN
MRSA, PCR: POSITIVE — AB
Staphylococcus aureus: POSITIVE — AB

## 2017-07-31 LAB — HIV ANTIBODY (ROUTINE TESTING W REFLEX): HIV Screen 4th Generation wRfx: NONREACTIVE

## 2017-07-31 LAB — PROTIME-INR
INR: 1.13
Prothrombin Time: 14.4 seconds (ref 11.4–15.2)

## 2017-07-31 MED ORDER — POTASSIUM CHLORIDE 10 MEQ/100ML IV SOLN
10.0000 meq | INTRAVENOUS | Status: AC
  Administered 2017-07-31 (×4): 10 meq via INTRAVENOUS
  Filled 2017-07-31 (×4): qty 100

## 2017-07-31 MED ORDER — DEXTROSE 5 % IV SOLN
2.0000 g | INTRAVENOUS | Status: DC
  Administered 2017-07-31 – 2017-08-03 (×3): 2 g via INTRAVENOUS
  Filled 2017-07-31 (×4): qty 2

## 2017-07-31 MED ORDER — INSULIN ASPART 100 UNIT/ML ~~LOC~~ SOLN
15.0000 [IU] | Freq: Once | SUBCUTANEOUS | Status: AC
  Administered 2017-07-31: 15 [IU] via SUBCUTANEOUS

## 2017-07-31 MED ORDER — POTASSIUM CHLORIDE 10 MEQ/100ML IV SOLN
10.0000 meq | INTRAVENOUS | Status: AC
  Administered 2017-07-31 (×2): 10 meq via INTRAVENOUS
  Filled 2017-07-31 (×2): qty 100

## 2017-07-31 MED ORDER — POTASSIUM CHLORIDE IN NACL 40-0.9 MEQ/L-% IV SOLN
INTRAVENOUS | Status: AC
  Administered 2017-07-31 (×2): 75 mL/h via INTRAVENOUS
  Filled 2017-07-31 (×2): qty 1000

## 2017-07-31 NOTE — Progress Notes (Signed)
PHARMACY - PHYSICIAN COMMUNICATION CRITICAL VALUE ALERT - BLOOD CULTURE IDENTIFICATION (BCID)  Results for orders placed or performed during the hospital encounter of 07/30/17  Blood Culture ID Panel (Reflexed) (Collected: 07/30/2017  9:45 AM)  Result Value Ref Range   Enterococcus species NOT DETECTED NOT DETECTED   Vancomycin resistance NOT DETECTED NOT DETECTED   Listeria monocytogenes NOT DETECTED NOT DETECTED   Staphylococcus species NOT DETECTED NOT DETECTED   Staphylococcus aureus NOT DETECTED NOT DETECTED   Methicillin resistance NOT DETECTED NOT DETECTED   Streptococcus species NOT DETECTED NOT DETECTED   Streptococcus agalactiae NOT DETECTED NOT DETECTED   Streptococcus pneumoniae NOT DETECTED NOT DETECTED   Streptococcus pyogenes NOT DETECTED NOT DETECTED   Acinetobacter baumannii NOT DETECTED NOT DETECTED   Enterobacteriaceae species DETECTED (A) NOT DETECTED   Enterobacter cloacae complex NOT DETECTED NOT DETECTED   Escherichia coli NOT DETECTED NOT DETECTED   Klebsiella oxytoca NOT DETECTED NOT DETECTED   Klebsiella pneumoniae DETECTED (A) NOT DETECTED   Proteus species NOT DETECTED NOT DETECTED   Serratia marcescens NOT DETECTED NOT DETECTED   Carbapenem resistance NOT DETECTED NOT DETECTED   Haemophilus influenzae NOT DETECTED NOT DETECTED   Neisseria meningitidis NOT DETECTED NOT DETECTED   Pseudomonas aeruginosa NOT DETECTED NOT DETECTED   Candida albicans NOT DETECTED NOT DETECTED   Candida glabrata NOT DETECTED NOT DETECTED   Candida krusei NOT DETECTED NOT DETECTED   Candida parapsilosis NOT DETECTED NOT DETECTED   Candida tropicalis NOT DETECTED NOT DETECTED    Name of physician (or Provider) Contacted: [place]  Changes to prescribed antibiotics required: Ceftriaxone will cover but increased dose to 2gm daily  Lorilynn Lehr, Rande Lawman 07/31/2017  11:42 AM

## 2017-07-31 NOTE — Progress Notes (Signed)
Subjective/Chief Complaint: Still with moderate abdominal pain   Objective: Vital signs in last 24 hours: Temp:  [98.6 F (37 C)-99.7 F (37.6 C)] 98.6 F (37 C) (09/08 0500) Pulse Rate:  [75-96] 75 (09/08 0500) Resp:  [17-26] 17 (09/08 0500) BP: (112-169)/(48-90) 118/62 (09/08 0500) SpO2:  [90 %-100 %] 92 % (09/08 0500) Weight:  [84.1 kg (185 lb 6.5 oz)-84.8 kg (186 lb 15.2 oz)] 84.8 kg (186 lb 15.2 oz) (09/08 0500)    Intake/Output from previous day: 09/07 0701 - 09/08 0700 In: 2612.3 [I.V.:1413.3; IV Piggyback:1199] Out: -  Intake/Output this shift: No intake/output data recorded.  Exam: Abdomen soft, moderate RUQ tenderness and guarding  Lab Results:   Recent Labs  07/30/17 0634 07/31/17 0342  WBC 17.7* 11.5*  HGB 13.1 10.5*  HCT 38.0 31.2*  PLT 214 199   BMET  Recent Labs  07/30/17 1330 07/31/17 0342  NA 127* 131*  K 4.3 3.2*  CL 93* 101  CO2 22 20*  GLUCOSE 275* 170*  BUN 32* 22*  CREATININE 1.21* 0.90  CALCIUM 6.6* 6.5*   PT/INR  Recent Labs  07/30/17 0634 07/31/17 0342  LABPROT 15.4* 14.4  INR 1.23 1.13   ABG No results for input(s): PHART, HCO3 in the last 72 hours.  Invalid input(s): PCO2, PO2  Studies/Results: Ct Abdomen Pelvis Wo Contrast  Result Date: 07/30/2017 CLINICAL DATA:  Upper abdominal pain with nausea and vomiting EXAM: CT ABDOMEN AND PELVIS WITHOUT CONTRAST TECHNIQUE: Multidetector CT imaging of the abdomen and pelvis was performed following the standard protocol without oral or intravenous contrast maternal administration. COMPARISON:  January 12, 2014 FINDINGS: Lower chest: There is bibasilar atelectatic change. There are foci of coronary artery calcification evident. Hepatobiliary: Liver measures 19.5 cm in length. No focal liver lesions are evident on this noncontrast enhanced study. Gallbladder wall appears thickened with pericholecystic edema. There is a tiny gallstone in the gallbladder. Gallbladder is mildly  distended. There is no appreciable biliary duct dilatation. Pancreas: No pancreatic mass or inflammatory focus. Spleen: No splenic lesions are evident. Adrenals/Urinary Tract: There is slight left adrenal hypertrophy. Right adrenal appears normal. Kidneys bilaterally show no evident mass or hydronephrosis on either side. There is no renal or ureteral calculus on either side. Urinary bladder wall is normal in thickness. However, there is extensive air within the urinary bladder. Stomach/Bowel: There are scattered left-sided colonic diverticula without diverticulitis. There is no bowel wall or mesenteric thickening. No bowel obstruction. No free air or portal venous air. Vascular/Lymphatic: There is atherosclerotic calcification in the aorta and iliac arteries. There are patchy areas of atherosclerotic calcification in the major mesenteric artery vessels. The major mesenteric arterial vessels appear patent on this noncontrast enhanced study. There is no adenopathy appreciable in the abdomen or pelvis. Reproductive: Uterus is anteverted.  No pelvic mass is evident. Other: Appendix appears normal. There is no abscess or ascites in the abdomen or pelvis. There is thinning of the rectus muscle in the midline. Fat extends into this area of thinning, but no bowel extends into this area. Musculoskeletal: There is degenerative change in the visualized thoracic and lumbar spine regions. No blastic or lytic bone lesions evident. There is no intramuscular or abdominal wall lesion. IMPRESSION: 1. Findings felt to represent a degree of acute cholecystitis. Gallbladder appears mildly distended with wall thickening and pericholecystic edema. Tiny gallstone noted. No appreciable biliary duct dilatation. 2. Extensive air noted in urinary bladder. If patient has not been recently instrumented, this finding raises concern  for gas-forming infectious organism within the urine in the urinary bladder. 3. Left-sided colonic diverticulosis  without diverticulitis. No bowel obstruction or bowel wall thickening. Appendix appears normal. No abscess in the abdomen or pelvis. 4. No renal or ureteral calculus on either side. No hydronephrosis. 5.  Midline rectus wall thickening. 6. Aortoiliac atherosclerosis. Patchy calcification in the proximal mesenteric arterial vessels noted. Foci of coronary artery calcification also noted. Aortic Atherosclerosis (ICD10-I70.0). Electronically Signed   By: Lowella Grip III M.D.   On: 07/30/2017 08:59    Anti-infectives: Anti-infectives    Start     Dose/Rate Route Frequency Ordered Stop   07/31/17 1100  cefTRIAXone (ROCEPHIN) 1 g in dextrose 5 % 50 mL IVPB     1 g 100 mL/hr over 30 Minutes Intravenous Every 24 hours 07/30/17 1133     07/30/17 1800  metroNIDAZOLE (FLAGYL) IVPB 500 mg     500 mg 100 mL/hr over 60 Minutes Intravenous Every 8 hours 07/30/17 1133     07/30/17 0915  cefTRIAXone (ROCEPHIN) 1 g in dextrose 5 % 50 mL IVPB     1 g 100 mL/hr over 30 Minutes Intravenous  Once 07/30/17 0902 07/30/17 1054   07/30/17 0915  metroNIDAZOLE (FLAGYL) IVPB 500 mg     500 mg 100 mL/hr over 60 Minutes Intravenous  Once 07/30/17 0902 07/30/17 1217      Assessment/Plan:  Acute cholecystitis with gallstones, other medical issues  LFT's improving, WBC down, creatinine improving  Continue IV antibiotics  Eventual lap chole with IOC tomorrow vs Monday   LOS: 1 day    Ireland Chagnon A 07/31/2017

## 2017-07-31 NOTE — Progress Notes (Addendum)
   Subjective: Doing well this AM. Abdominal pain is much improved and she is feeling much better. Continues to have some RUQ pain. Discussed the plan to continue antibiotic therapy and wait for surgery. She agrees with the plan. No questions or concerns at this point. Requesting something to wet her mouth. Denies N/V, fevers, further pneumoturia, diarrhea.   Objective: Vital signs in last 24 hours: Vitals:   07/30/17 1645 07/30/17 1715 07/30/17 2218 07/31/17 0500  BP: (!) 166/87 (!) 112/48 129/64 118/62  Pulse: 87 89 85 75  Resp: (!) 24 20 18 17   Temp:  99.7 F (37.6 C) 98.8 F (37.1 C) 98.6 F (37 C)  TempSrc:  Oral Oral   SpO2: 98% 90% 92% 92%  Weight:  185 lb 6.5 oz (84.1 kg)  186 lb 15.2 oz (84.8 kg)  Height:  5\' 7"  (1.702 m)     Physical Exam  Constitutional: She is oriented to person, place, and time. She appears well-developed and well-nourished.  Cardiovascular: Normal rate, regular rhythm, normal heart sounds and intact distal pulses.   Pulmonary/Chest: Breath sounds normal. She has no wheezes. She has no rales.  Abdominal: Soft. Bowel sounds are normal. She exhibits no distension. There is tenderness (RUQ). There is no rebound and no guarding.  Musculoskeletal: Normal range of motion. She exhibits no edema.  Neurological: She is alert and oriented to person, place, and time.  Skin: Skin is warm and dry.   Assessment/Plan: Principal Problem:   Cholelithiasis with cholecystitis Active Problems:   Diabetes Mellitus, Type 2   Hypertension   Chronic Systolic Heart Failure  1. Acute Cholecystitis  - RUQ abdominal pain, N/V, and leukocytosis  - No fevers, AMS, or hypotension  - CT abdomen illustrated gallbladder distention and wall thickening with pericholecystic edema, gallstones, and no ductal dilation. - LFTs and leukocytosis improving, trending down - Surgery consulted; possible surgery today  - Continue antibiotic coverage with Rocephin and Flagyl   2. Urinary  Tract Infection/ Pneumaturia  - Denies symptoms of dysuria or increased frequency  - Acknowledges "passing gas" with urination  - UA significant for multiple bacteria, moderate hgb, and trace leukocytes  - Most common cause for pneumaturia is a colovesical fistula however, it can be caused by gas producing bacteria  - Adequate coverage with Rocephin and Flagyl  - Urine cultures pending  - If urine cultures are negative, can consider CT pelvis with oral contrast to assess for colovesical fistula.  3. Acute Kidney Injury   - Baseline 0.6-0.7  - Trend: 1.65 -> 1.21 -> 0.90 - Continue IVF  4. Hyponatremia  - Na 131 this AM - Has corrected 4 mEq/L over the past 24 hours  - Continue IVF  Hyperglycemia. Resolved  Diet: NPO at midnight  VTE ppx: SCDs  Code Status: Full   Dispo: Anticipated discharge in approximately >1 day(s).   Ina Homes, MD 07/31/2017, 8:59 AM My Pager: [phone]

## 2017-08-01 LAB — GLUCOSE, CAPILLARY
GLUCOSE-CAPILLARY: 188 mg/dL — AB (ref 65–99)
GLUCOSE-CAPILLARY: 168 mg/dL — AB (ref 65–99)
GLUCOSE-CAPILLARY: 276 mg/dL — AB (ref 65–99)
GLUCOSE-CAPILLARY: 304 mg/dL — AB (ref 65–99)
Glucose-Capillary: 274 mg/dL — ABNORMAL HIGH (ref 65–99)

## 2017-08-01 LAB — URINE CULTURE: Culture: >=100000 — AB

## 2017-08-01 LAB — COMPREHENSIVE METABOLIC PANEL
ALBUMIN: 2.3 g/dL — AB (ref 3.5–5.0)
ALK PHOS: 243 U/L — AB (ref 38–126)
ALT: 218 U/L — ABNORMAL HIGH (ref 14–54)
AST: 38 U/L (ref 15–41)
Anion gap: 11 (ref 5–15)
BILIRUBIN TOTAL: 1.9 mg/dL — AB (ref 0.3–1.2)
BUN: 11 mg/dL (ref 6–20)
CO2: 21 mmol/L — ABNORMAL LOW (ref 22–32)
CREATININE: 0.7 mg/dL (ref 0.44–1.00)
Calcium: 6.9 mg/dL — ABNORMAL LOW (ref 8.9–10.3)
Chloride: 99 mmol/L — ABNORMAL LOW (ref 101–111)
GFR calc Af Amer: >60 mL/min (ref >60)
GLUCOSE: 194 mg/dL — AB (ref 65–99)
Potassium: 4.2 mmol/L (ref 3.5–5.1)
Sodium: 131 mmol/L — ABNORMAL LOW (ref 135–145)
TOTAL PROTEIN: 6.1 g/dL — AB (ref 6.5–8.1)

## 2017-08-01 LAB — CBC
HCT: 35.2 % — ABNORMAL LOW (ref 36.0–46.0)
HEMOGLOBIN: 12.1 g/dL (ref 12.0–15.0)
MCH: 26 pg (ref 26.0–34.0)
MCHC: 34.4 g/dL (ref 30.0–36.0)
MCV: 75.7 fL — ABNORMAL LOW (ref 78.0–100.0)
Platelets: 216 10*3/uL (ref 150–400)
RBC: 4.65 MIL/uL (ref 3.87–5.11)
RDW: 14.3 % (ref 11.5–15.5)
WBC: 11.6 10*3/uL — AB (ref 4.0–10.5)

## 2017-08-01 MED ORDER — POTASSIUM CHLORIDE IN NACL 40-0.9 MEQ/L-% IV SOLN
INTRAVENOUS | Status: AC
  Administered 2017-08-01: 75 mL/h via INTRAVENOUS
  Filled 2017-08-01: qty 1000

## 2017-08-01 MED ORDER — INSULIN ASPART 100 UNIT/ML ~~LOC~~ SOLN
0.0000 [IU] | Freq: Three times a day (TID) | SUBCUTANEOUS | Status: DC
  Administered 2017-08-01: 3 [IU] via SUBCUTANEOUS
  Administered 2017-08-01: 11 [IU] via SUBCUTANEOUS
  Administered 2017-08-01 – 2017-08-02 (×2): 3 [IU] via SUBCUTANEOUS
  Administered 2017-08-02 – 2017-08-03 (×2): 5 [IU] via SUBCUTANEOUS
  Administered 2017-08-03: 3 [IU] via SUBCUTANEOUS

## 2017-08-01 NOTE — Progress Notes (Signed)
Medicine attending: Clinical status and database reviewed with resident physician Dr. Zada Finders and I concur with his evaluation and management plan. She remains afebrile on Rocephin and Flagyl.  One blood culture now growing Klebsiella from September 7. Alkaline phosphatase rising, transaminases and bilirubin falling. In view of Klebsiella septicemia, surgery recommends continued antibiotic therapy prior to cholecystectomy. With respect to her nonischemic cardiomyopathy, we will go ahead and get a follow-up echocardiogram to compare with the July 2015 study.  Cardiogram on admission with no acute ischemic changes or arrhythmia.  Chest x-ray with stable cardiomegaly no overt failure.

## 2017-08-01 NOTE — Progress Notes (Signed)
Subjective/Chief Complaint: Still with right sided abdominal pain   Objective: Vital signs in last 24 hours: Temp:  [98.9 F (37.2 C)-99.4 F (37.4 C)] 99.1 F (37.3 C) (09/09 0439) Pulse Rate:  [78-84] 79 (09/09 0439) Resp:  [17-18] 18 (09/09 0439) BP: (126-132)/(55-69) 132/69 (09/09 0439) SpO2:  [94 %-97 %] 97 % (09/09 0439) Weight:  [84.4 kg (186 lb 1.6 oz)] 84.4 kg (186 lb 1.6 oz) (09/09 0439) Last BM Date: 07/31/17  Intake/Output from previous day: 09/08 0701 - 09/09 0700 In: 120 [P.O.:120] Out: -  Intake/Output this shift: No intake/output data recorded.  Exam: Actually looks more comfortable than yesterday Abdomen with less tenderness and guarding in the RUQ  Lab Results:   Recent Labs  07/31/17 0342 08/01/17 0459  WBC 11.5* 11.6*  HGB 10.5* 12.1  HCT 31.2* 35.2*  PLT 199 216   BMET  Recent Labs  07/31/17 0342 08/01/17 0459  NA 131* 131*  K 3.2* 4.2  CL 101 99*  CO2 20* 21*  GLUCOSE 170* 194*  BUN 22* 11  CREATININE 0.90 0.70  CALCIUM 6.5* 6.9*   PT/INR  Recent Labs  07/30/17 0634 07/31/17 0342  LABPROT 15.4* 14.4  INR 1.23 1.13   ABG No results for input(s): PHART, HCO3 in the last 72 hours.  Invalid input(s): PCO2, PO2  Studies/Results: Ct Abdomen Pelvis Wo Contrast  Result Date: 07/30/2017 CLINICAL DATA:  Upper abdominal pain with nausea and vomiting EXAM: CT ABDOMEN AND PELVIS WITHOUT CONTRAST TECHNIQUE: Multidetector CT imaging of the abdomen and pelvis was performed following the standard protocol without oral or intravenous contrast maternal administration. COMPARISON:  January 12, 2014 FINDINGS: Lower chest: There is bibasilar atelectatic change. There are foci of coronary artery calcification evident. Hepatobiliary: Liver measures 19.5 cm in length. No focal liver lesions are evident on this noncontrast enhanced study. Gallbladder wall appears thickened with pericholecystic edema. There is a tiny gallstone in the  gallbladder. Gallbladder is mildly distended. There is no appreciable biliary duct dilatation. Pancreas: No pancreatic mass or inflammatory focus. Spleen: No splenic lesions are evident. Adrenals/Urinary Tract: There is slight left adrenal hypertrophy. Right adrenal appears normal. Kidneys bilaterally show no evident mass or hydronephrosis on either side. There is no renal or ureteral calculus on either side. Urinary bladder wall is normal in thickness. However, there is extensive air within the urinary bladder. Stomach/Bowel: There are scattered left-sided colonic diverticula without diverticulitis. There is no bowel wall or mesenteric thickening. No bowel obstruction. No free air or portal venous air. Vascular/Lymphatic: There is atherosclerotic calcification in the aorta and iliac arteries. There are patchy areas of atherosclerotic calcification in the major mesenteric artery vessels. The major mesenteric arterial vessels appear patent on this noncontrast enhanced study. There is no adenopathy appreciable in the abdomen or pelvis. Reproductive: Uterus is anteverted.  No pelvic mass is evident. Other: Appendix appears normal. There is no abscess or ascites in the abdomen or pelvis. There is thinning of the rectus muscle in the midline. Fat extends into this area of thinning, but no bowel extends into this area. Musculoskeletal: There is degenerative change in the visualized thoracic and lumbar spine regions. No blastic or lytic bone lesions evident. There is no intramuscular or abdominal wall lesion. IMPRESSION: 1. Findings felt to represent a degree of acute cholecystitis. Gallbladder appears mildly distended with wall thickening and pericholecystic edema. Tiny gallstone noted. No appreciable biliary duct dilatation. 2. Extensive air noted in urinary bladder. If patient has not been recently instrumented,  this finding raises concern for gas-forming infectious organism within the urine in the urinary bladder. 3.  Left-sided colonic diverticulosis without diverticulitis. No bowel obstruction or bowel wall thickening. Appendix appears normal. No abscess in the abdomen or pelvis. 4. No renal or ureteral calculus on either side. No hydronephrosis. 5.  Midline rectus wall thickening. 6. Aortoiliac atherosclerosis. Patchy calcification in the proximal mesenteric arterial vessels noted. Foci of coronary artery calcification also noted. Aortic Atherosclerosis (ICD10-I70.0). Electronically Signed   By: Lowella Grip III M.D.   On: 07/30/2017 08:59   Dg Chest Port 1 View  Result Date: 07/31/2017 CLINICAL DATA:  Cough. EXAM: PORTABLE CHEST 1 VIEW COMPARISON:  February 18, 2014 FINDINGS: Cardiomegaly. Stable torturous thoracic aorta. Atelectasis or scar in the left lung base. No other interval changes or acute abnormalities. IMPRESSION: No cause for the patient's cough identified. A linear opacity in left base is consistent with scar or atelectasis. Stable cardiomegaly. Electronically Signed   By: Dorise Bullion III M.D   On: 07/31/2017 10:14    Anti-infectives: Anti-infectives    Start     Dose/Rate Route Frequency Ordered Stop   07/31/17 1200  cefTRIAXone (ROCEPHIN) 2 g in dextrose 5 % 50 mL IVPB     2 g 100 mL/hr over 30 Minutes Intravenous Every 24 hours 07/31/17 1141     07/31/17 1100  cefTRIAXone (ROCEPHIN) 1 g in dextrose 5 % 50 mL IVPB  Status:  Discontinued     1 g 100 mL/hr over 30 Minutes Intravenous Every 24 hours 07/30/17 1133 07/31/17 1141   07/30/17 1800  metroNIDAZOLE (FLAGYL) IVPB 500 mg     500 mg 100 mL/hr over 60 Minutes Intravenous Every 8 hours 07/30/17 1133     07/30/17 0915  cefTRIAXone (ROCEPHIN) 1 g in dextrose 5 % 50 mL IVPB     1 g 100 mL/hr over 30 Minutes Intravenous  Once 07/30/17 0902 07/30/17 1054   07/30/17 0915  metroNIDAZOLE (FLAGYL) IVPB 500 mg     500 mg 100 mL/hr over 60 Minutes Intravenous  Once 07/30/17 0902 07/30/17 1217      Assessment/Plan:  Patient with  cholecystitis and UTI with positive urine and blood cultures.  LFT's continue downward trend.  Will hold on surgery today given cultures. Question whether she needs cardiac clearance for general anesthesia?  Will defer to the primary team. We may have to consider perc chole drain by IR  LOS: 2 days    Kevion Fatheree A 08/01/2017

## 2017-08-01 NOTE — Progress Notes (Signed)
   Subjective:  Patient reports continued RUQ pain, worsened compared to yesterday. Has some nausea and loose stools as well. She denies fevers or chills.   Objective:  Vital signs in last 24 hours: Vitals:   07/31/17 0500 07/31/17 1742 07/31/17 2007 08/01/17 0439  BP: 118/62 (!) 126/55 128/61 132/69  Pulse: 75 78 84 79  Resp: 17 17 18 18   Temp: 98.6 F (37 C) 99.4 F (37.4 C) 98.9 F (37.2 C) 99.1 F (37.3 C)  TempSrc:  Oral Oral Oral  SpO2: 92% 94% 96% 97%  Weight: 186 lb 15.2 oz (84.8 kg)   186 lb 1.6 oz (84.4 kg)  Height:       General: resting in bed Cardiac: RRR, no rubs, murmurs or gallops Pulm: clear to auscultation bilaterally, moving normal volumes of air Abd: tender RUQ Ext: warm and well perfused, no pedal edema Neuro: alert and oriented X3   Assessment/Plan:  Principal Problem:   Cholelithiasis with cholecystitis Active Problems:   Diabetes Mellitus, Type 2   Hypertension   Chronic Systolic Heart Failure   Pneumaturia  Acute Cholecystitis  - CT abdomen illustrated gallbladder distention and wall thickening with pericholecystic edema, gallstones, and no ductal dilation. - LFTs and leukocytosis improving, trending down - Surgery consulted; possible surgery today or tomorrow - Continue antibiotic coverage with Rocephin and Flagyl   Urinary Tract Infection/ Pneumaturia  - Denies symptoms of dysuria or increased frequency  - UA significant for multiple bacteria, moderate hgb, and trace leukocytes - Urine cultures growing Klebsiella pneumoniae - sensitive to Ceftriaxone   - Most common cause for pneumaturia is a colovesical fistula however, it can be caused by gas producing bacteria   Bacteremia: 1/2 Blood Cultures positive for Klebsiella pneumoniae and enterobacteriacae species - Covered by Ceftriaxone 2 grams q24h  Acute Kidney Injury - Resolved - Baseline 0.6-0.7  - Trend: 1.65 -> 1.21 -> 0.90 -> 0.7 - Continue IVF  Hyponatremia  - Na 131  this AM - Continue IVF  T2DM: Hyperglycemic last night, improved now. - Lantus 25 units qhs - Moderate SSI   Diet: NPO VTE [id number]  Code Status: Full   Dispo: Anticipated pending clinical course.  Zada Finders, MD 08/01/2017, 8:13 AM

## 2017-08-02 ENCOUNTER — Encounter (HOSPITAL_COMMUNITY)
Admission: EM | Disposition: A | Discharge status: Home / Self Care | Payer: Self-pay | Source: Home / Self Care | Attending: Oncology

## 2017-08-02 ENCOUNTER — Encounter (HOSPITAL_COMMUNITY): Payer: Self-pay | Admitting: Orthopedic Surgery

## 2017-08-02 ENCOUNTER — Other Ambulatory Visit (HOSPITAL_COMMUNITY): Payer: Medicare Other

## 2017-08-02 ENCOUNTER — Inpatient Hospital Stay (HOSPITAL_COMMUNITY): Payer: Medicare Other | Admitting: Anesthesiology

## 2017-08-02 DIAGNOSIS — I5022 Chronic systolic (congestive) heart failure: Secondary | ICD-10-CM

## 2017-08-02 DIAGNOSIS — K8000 Calculus of gallbladder with acute cholecystitis without obstruction: Principal | ICD-10-CM

## 2017-08-02 HISTORY — PX: CHOLECYSTECTOMY: SHX55

## 2017-08-02 LAB — CBC WITH DIFFERENTIAL/PLATELET
BASOS PCT: 0 %
Basophils Absolute: 0 10*3/uL (ref 0.0–0.1)
EOS ABS: 0.1 10*3/uL (ref 0.0–0.7)
Eosinophils Relative: 1 %
HCT: 30.9 % — ABNORMAL LOW (ref 36.0–46.0)
Hemoglobin: 10.6 g/dL — ABNORMAL LOW (ref 12.0–15.0)
LYMPHS ABS: 1.3 10*3/uL (ref 0.7–4.0)
Lymphocytes Relative: 12 %
MCH: 26 pg (ref 26.0–34.0)
MCHC: 34.3 g/dL (ref 30.0–36.0)
MCV: 75.7 fL — ABNORMAL LOW (ref 78.0–100.0)
MONO ABS: 1.4 10*3/uL — AB (ref 0.1–1.0)
Monocytes Relative: 13 %
NEUTROS ABS: 8.2 10*3/uL — AB (ref 1.7–7.7)
NEUTROS PCT: 74 %
PLATELETS: 266 10*3/uL (ref 150–400)
RBC: 4.08 MIL/uL (ref 3.87–5.11)
RDW: 15.1 % (ref 11.5–15.5)
WBC: 11 10*3/uL — ABNORMAL HIGH (ref 4.0–10.5)

## 2017-08-02 LAB — GLUCOSE, CAPILLARY
GLUCOSE-CAPILLARY: 203 mg/dL — AB (ref 65–99)
GLUCOSE-CAPILLARY: 186 mg/dL — AB (ref 65–99)
Glucose-Capillary: 188 mg/dL — ABNORMAL HIGH (ref 65–99)
Glucose-Capillary: 187 mg/dL — ABNORMAL HIGH (ref 65–99)
Glucose-Capillary: 197 mg/dL — ABNORMAL HIGH (ref 65–99)

## 2017-08-02 LAB — COMPREHENSIVE METABOLIC PANEL
ALT: 138 U/L — ABNORMAL HIGH (ref 14–54)
ALT: 142 U/L — ABNORMAL HIGH (ref 14–54)
ANION GAP: 8 (ref 5–15)
AST: 38 U/L (ref 15–41)
AST: 60 U/L — ABNORMAL HIGH (ref 15–41)
Albumin: 2 g/dL — ABNORMAL LOW (ref 3.5–5.0)
Albumin: 2.3 g/dL — ABNORMAL LOW (ref 3.5–5.0)
Alkaline Phosphatase: 225 U/L — ABNORMAL HIGH (ref 38–126)
Alkaline Phosphatase: 247 U/L — ABNORMAL HIGH (ref 38–126)
Anion gap: 8 (ref 5–15)
BILIRUBIN TOTAL: 1.4 mg/dL — AB (ref 0.3–1.2)
BUN: 9 mg/dL (ref 6–20)
BUN: 11 mg/dL (ref 6–20)
CHLORIDE: 97 mmol/L — AB (ref 101–111)
CO2: 24 mmol/L (ref 22–32)
CO2: 26 mmol/L (ref 22–32)
Calcium: 6.6 mg/dL — ABNORMAL LOW (ref 8.9–10.3)
Calcium: 6.7 mg/dL — ABNORMAL LOW (ref 8.9–10.3)
Chloride: 99 mmol/L — ABNORMAL LOW (ref 101–111)
Creatinine, Ser: 0.59 mg/dL (ref 0.44–1.00)
Creatinine, Ser: 0.65 mg/dL (ref 0.44–1.00)
GFR calc Af Amer: >60 mL/min (ref >60)
GFR calc non Af Amer: >60 mL/min (ref >60)
GLUCOSE: 208 mg/dL — AB (ref 65–99)
Glucose, Bld: 208 mg/dL — ABNORMAL HIGH (ref 65–99)
POTASSIUM: 4.1 mmol/L (ref 3.5–5.1)
Potassium: 3.8 mmol/L (ref 3.5–5.1)
SODIUM: 131 mmol/L — AB (ref 135–145)
Sodium: 131 mmol/L — ABNORMAL LOW (ref 135–145)
TOTAL PROTEIN: 5.6 g/dL — AB (ref 6.5–8.1)
Total Bilirubin: 1.4 mg/dL — ABNORMAL HIGH (ref 0.3–1.2)
Total Protein: 5.7 g/dL — ABNORMAL LOW (ref 6.5–8.1)

## 2017-08-02 LAB — CULTURE, BLOOD (ROUTINE X 2): Special Requests: ADEQUATE

## 2017-08-02 SURGERY — LAPAROSCOPIC CHOLECYSTECTOMY
Anesthesia: General | Site: Abdomen

## 2017-08-02 MED ORDER — LIDOCAINE HCL (CARDIAC) 20 MG/ML IV SOLN
INTRAVENOUS | Status: DC | PRN
  Administered 2017-08-02: 80 mg via INTRAVENOUS

## 2017-08-02 MED ORDER — MUPIROCIN 2 % EX OINT
TOPICAL_OINTMENT | CUTANEOUS | Status: AC
  Administered 2017-08-02: 1 via NASAL
  Filled 2017-08-02: qty 22

## 2017-08-02 MED ORDER — PHENYLEPHRINE 40 MCG/ML (10ML) SYRINGE FOR IV PUSH (FOR BLOOD PRESSURE SUPPORT)
PREFILLED_SYRINGE | INTRAVENOUS | Status: DC | PRN
  Administered 2017-08-02: 120 ug via INTRAVENOUS
  Administered 2017-08-02: 40 ug via INTRAVENOUS
  Administered 2017-08-02: 80 ug via INTRAVENOUS
  Administered 2017-08-02 (×2): 40 ug via INTRAVENOUS
  Administered 2017-08-02: 80 ug via INTRAVENOUS

## 2017-08-02 MED ORDER — CEFAZOLIN SODIUM-DEXTROSE 2-4 GM/100ML-% IV SOLN
2.0000 g | INTRAVENOUS | Status: AC
  Administered 2017-08-02: 2 g via INTRAVENOUS
  Filled 2017-08-02 (×2): qty 100

## 2017-08-02 MED ORDER — IOPAMIDOL (ISOVUE-300) INJECTION 61%
INTRAVENOUS | Status: AC
  Filled 2017-08-02: qty 50

## 2017-08-02 MED ORDER — CHLORHEXIDINE GLUCONATE CLOTH 2 % EX PADS
6.0000 | MEDICATED_PAD | Freq: Once | CUTANEOUS | Status: DC
  Administered 2017-08-02: 6 via TOPICAL

## 2017-08-02 MED ORDER — EPHEDRINE 5 MG/ML INJ
INTRAVENOUS | Status: AC
  Filled 2017-08-02: qty 10

## 2017-08-02 MED ORDER — BUPIVACAINE-EPINEPHRINE 0.25% -1:200000 IJ SOLN
INTRAMUSCULAR | Status: DC | PRN
  Administered 2017-08-02: 5 mL

## 2017-08-02 MED ORDER — LIDOCAINE 2% (20 MG/ML) 5 ML SYRINGE
INTRAMUSCULAR | Status: AC
  Filled 2017-08-02: qty 15

## 2017-08-02 MED ORDER — FENTANYL CITRATE (PF) 250 MCG/5ML IJ SOLN
INTRAMUSCULAR | Status: AC
  Filled 2017-08-02: qty 5

## 2017-08-02 MED ORDER — MIDAZOLAM HCL 5 MG/5ML IJ SOLN
INTRAMUSCULAR | Status: DC | PRN
  Administered 2017-08-02: 2 mg via INTRAVENOUS

## 2017-08-02 MED ORDER — SODIUM CHLORIDE 0.9 % IV SOLN: INTRAVENOUS | Status: DC | PRN

## 2017-08-02 MED ORDER — MUPIROCIN 2 % EX OINT: TOPICAL_OINTMENT | Freq: Two times a day (BID) | CUTANEOUS | Status: DC

## 2017-08-02 MED ORDER — BUPIVACAINE-EPINEPHRINE (PF) 0.25% -1:200000 IJ SOLN
INTRAMUSCULAR | Status: AC
  Filled 2017-08-02: qty 30

## 2017-08-02 MED ORDER — PROPOFOL 10 MG/ML IV BOLUS
INTRAVENOUS | Status: DC | PRN
  Administered 2017-08-02: 100 mg via INTRAVENOUS

## 2017-08-02 MED ORDER — ROCURONIUM BROMIDE 100 MG/10ML IV SOLN
INTRAVENOUS | Status: DC | PRN
  Administered 2017-08-02: 50 mg via INTRAVENOUS

## 2017-08-02 MED ORDER — SODIUM CHLORIDE 0.9 % IR SOLN
Status: DC | PRN
  Administered 2017-08-02: 1000 mL

## 2017-08-02 MED ORDER — 0.9 % SODIUM CHLORIDE (POUR BTL) OPTIME
TOPICAL | Status: DC | PRN
  Administered 2017-08-02: 1000 mL

## 2017-08-02 MED ORDER — MIDAZOLAM HCL 2 MG/2ML IJ SOLN
INTRAMUSCULAR | Status: AC
  Filled 2017-08-02: qty 2

## 2017-08-02 MED ORDER — INSULIN GLARGINE 100 UNIT/ML ~~LOC~~ SOLN
28.0000 [IU] | Freq: Every day | SUBCUTANEOUS | Status: DC
  Administered 2017-08-02: 28 [IU] via SUBCUTANEOUS
  Filled 2017-08-02 (×2): qty 0.28

## 2017-08-02 MED ORDER — PHENYLEPHRINE 40 MCG/ML (10ML) SYRINGE FOR IV PUSH (FOR BLOOD PRESSURE SUPPORT)
PREFILLED_SYRINGE | INTRAVENOUS | Status: AC
  Filled 2017-08-02: qty 10

## 2017-08-02 MED ORDER — ONDANSETRON HCL 4 MG/2ML IJ SOLN
INTRAMUSCULAR | Status: AC
  Filled 2017-08-02: qty 4

## 2017-08-02 MED ORDER — ONDANSETRON HCL 4 MG/2ML IJ SOLN
INTRAMUSCULAR | Status: DC | PRN
  Administered 2017-08-02: 4 mg via INTRAVENOUS

## 2017-08-02 MED ORDER — ROCURONIUM BROMIDE 10 MG/ML (PF) SYRINGE
PREFILLED_SYRINGE | INTRAVENOUS | Status: AC
  Filled 2017-08-02: qty 30

## 2017-08-02 MED ORDER — MUPIROCIN 2 % EX OINT
TOPICAL_OINTMENT | Freq: Two times a day (BID) | CUTANEOUS | Status: DC
  Administered 2017-08-02: 22:00:00 via NASAL
  Administered 2017-08-02: 1 via NASAL
  Administered 2017-08-03: 10:00:00 via NASAL
  Filled 2017-08-02: qty 22

## 2017-08-02 MED ORDER — CHLORHEXIDINE GLUCONATE CLOTH 2 % EX PADS: 6.0000 | MEDICATED_PAD | Freq: Once | CUTANEOUS | Status: DC

## 2017-08-02 MED ORDER — FENTANYL CITRATE (PF) 100 MCG/2ML IJ SOLN
INTRAMUSCULAR | Status: DC | PRN
  Administered 2017-08-02: 100 ug via INTRAVENOUS

## 2017-08-02 MED ORDER — NEOSTIGMINE METHYLSULFATE 5 MG/5ML IV SOSY
PREFILLED_SYRINGE | INTRAVENOUS | Status: AC
  Filled 2017-08-02: qty 5

## 2017-08-02 MED ORDER — LACTATED RINGERS IV SOLN
INTRAVENOUS | Status: DC
  Administered 2017-08-02: 11:00:00 via INTRAVENOUS

## 2017-08-02 MED ORDER — SUGAMMADEX SODIUM 200 MG/2ML IV SOLN
INTRAVENOUS | Status: DC | PRN
  Administered 2017-08-02: 160 mg via INTRAVENOUS

## 2017-08-02 MED ORDER — SUCCINYLCHOLINE CHLORIDE 200 MG/10ML IV SOSY
PREFILLED_SYRINGE | INTRAVENOUS | Status: AC
  Filled 2017-08-02: qty 20

## 2017-08-02 MED ORDER — PROPOFOL 10 MG/ML IV BOLUS
INTRAVENOUS | Status: AC
  Filled 2017-08-02: qty 20

## 2017-08-02 MED ORDER — DEXAMETHASONE SODIUM PHOSPHATE 10 MG/ML IJ SOLN
INTRAMUSCULAR | Status: AC
  Filled 2017-08-02: qty 2

## 2017-08-02 MED ORDER — SUCCINYLCHOLINE CHLORIDE 20 MG/ML IJ SOLN
INTRAMUSCULAR | Status: DC | PRN
  Administered 2017-08-02: 140 mg via INTRAVENOUS

## 2017-08-02 MED ORDER — EPHEDRINE SULFATE 50 MG/ML IJ SOLN
INTRAMUSCULAR | Status: DC | PRN
  Administered 2017-08-02 (×2): 10 mg via INTRAVENOUS

## 2017-08-02 SURGICAL SUPPLY — 42 items
APPLIER CLIP ROT 10 11.4 M/L (STAPLE) ×4
CANISTER SUCT 3000ML PPV (MISCELLANEOUS) ×4 IMPLANT
CHLORAPREP W/TINT 26ML (MISCELLANEOUS) ×4 IMPLANT
CLIP APPLIE ROT 10 11.4 M/L (STAPLE) ×2 IMPLANT
COVER MAYO STAND STRL (DRAPES) IMPLANT
COVER SURGICAL LIGHT HANDLE (MISCELLANEOUS) ×4 IMPLANT
DERMABOND ADVANCED (GAUZE/BANDAGES/DRESSINGS) ×2
DERMABOND ADVANCED .7 DNX12 (GAUZE/BANDAGES/DRESSINGS) ×2 IMPLANT
DEVICE TROCAR PUNCTURE CLOSURE (ENDOMECHANICALS) ×4 IMPLANT
DRAPE C-ARM 42X72 X-RAY (DRAPES) IMPLANT
DRAPE LAPAROSCOPIC ABDOMINAL (DRAPES) ×4 IMPLANT
DRAPE WARM FLUID 44X44 (DRAPE) ×4 IMPLANT
ELECT REM PT RETURN 9FT ADLT (ELECTROSURGICAL) ×4
ELECTRODE REM PT RTRN 9FT ADLT (ELECTROSURGICAL) ×2 IMPLANT
ENDOLOOP SUT PDS II  0 18 (SUTURE) ×2
ENDOLOOP SUT PDS II 0 18 (SUTURE) ×2 IMPLANT
GLOVE BIO SURGEON STRL SZ8 (GLOVE) ×4 IMPLANT
GLOVE BIOGEL PI IND STRL 8 (GLOVE) ×2 IMPLANT
GLOVE BIOGEL PI INDICATOR 8 (GLOVE) ×2
GOWN STRL REUS W/ TWL LRG LVL3 (GOWN DISPOSABLE) ×10 IMPLANT
GOWN STRL REUS W/ TWL XL LVL3 (GOWN DISPOSABLE) ×2 IMPLANT
GOWN STRL REUS W/TWL LRG LVL3 (GOWN DISPOSABLE) ×10
GOWN STRL REUS W/TWL XL LVL3 (GOWN DISPOSABLE) ×2
KIT BASIN OR (CUSTOM PROCEDURE TRAY) ×4 IMPLANT
KIT ROOM TURNOVER OR (KITS) ×4 IMPLANT
NS IRRIG 1000ML POUR BTL (IV SOLUTION) ×4 IMPLANT
PACK GENERAL/GYN (CUSTOM PROCEDURE TRAY) ×4 IMPLANT
PAD ARMBOARD 7.5X6 YLW CONV (MISCELLANEOUS) ×8 IMPLANT
POUCH SPECIMEN RETRIEVAL 10MM (ENDOMECHANICALS) ×4 IMPLANT
SCISSORS LAP 5X35 DISP (ENDOMECHANICALS) ×4 IMPLANT
SET CHOLANGIOGRAPH 5 50 .035 (SET/KITS/TRAYS/PACK) IMPLANT
SET IRRIG TUBING LAPAROSCOPIC (IRRIGATION / IRRIGATOR) ×4 IMPLANT
SLEEVE ENDOPATH XCEL 5M (ENDOMECHANICALS) ×8 IMPLANT
SPECIMEN JAR SMALL (MISCELLANEOUS) ×4 IMPLANT
STAPLER VISISTAT 35W (STAPLE) ×4 IMPLANT
SUT MNCRL AB 4-0 PS2 18 (SUTURE) ×4 IMPLANT
SYR CONTROL 10ML LL (SYRINGE) ×4 IMPLANT
TOWEL OR 17X24 6PK STRL BLUE (TOWEL DISPOSABLE) ×4 IMPLANT
TRAY LAPAROSCOPIC MC (CUSTOM PROCEDURE TRAY) ×4 IMPLANT
TROCAR XCEL NON-BLD 11X100MML (ENDOMECHANICALS) ×8 IMPLANT
TROCAR XCEL NON-BLD 5MMX100MML (ENDOMECHANICALS) ×4 IMPLANT
TUBING INSUFFLATION (TUBING) ×4 IMPLANT

## 2017-08-02 NOTE — Progress Notes (Signed)
Subjective/Chief Complaint: abdominal pain Pt with abdominal pain   Objective: Vital signs in last 24 hours: Temp:  [98.2 F (36.8 C)-98.4 F (36.9 C)] 98.3 F (36.8 C) (09/10 0610) Pulse Rate:  [66-73] 66 (09/10 0610) Resp:  [17-18] 18 (09/10 0610) BP: (132-141)/(71-75) 132/71 (09/10 0610) SpO2:  [96 %-97 %] 96 % (09/10 0610) Weight:  [83.9 kg (184 lb 15.5 oz)] 83.9 kg (184 lb 15.5 oz) (09/10 0610) Last BM Date: 08/01/17  Intake/Output from previous day: 09/09 0701 - 09/10 0700 In: 595 [P.O.:120; I.V.:75; IV Piggyback:400] Out: 800 [Urine:800] Intake/Output this shift: No intake/output data recorded.  GI: tender RUQ   Lab Results:   Recent Labs  07/31/17 0342 08/01/17 0459  WBC 11.5* 11.6*  HGB 10.5* 12.1  HCT 31.2* 35.2*  PLT 199 216   BMET  Recent Labs  08/01/17 0459 08/02/17 0402  NA 131* 131*  K 4.2 3.8  CL 99* 99*  CO2 21* 24  GLUCOSE 194* 208*  BUN 11 9  CREATININE 0.70 0.59  CALCIUM 6.9* 6.6*   PT/INR  Recent Labs  07/31/17 0342  LABPROT 14.4  INR 1.13   ABG No results for input(s): PHART, HCO3 in the last 72 hours.  Invalid input(s): PCO2, PO2  Studies/Results: Dg Chest Port 1 View  Result Date: 07/31/2017 CLINICAL DATA:  Cough. EXAM: PORTABLE CHEST 1 VIEW COMPARISON:  February 18, 2014 FINDINGS: Cardiomegaly. Stable torturous thoracic aorta. Atelectasis or scar in the left lung base. No other interval changes or acute abnormalities. IMPRESSION: No cause for the patient's cough identified. A linear opacity in left base is consistent with scar or atelectasis. Stable cardiomegaly. Electronically Signed   By: Dorise Bullion III M.D   On: 07/31/2017 10:14    Anti-infectives: Anti-infectives    Start     Dose/Rate Route Frequency Ordered Stop   07/31/17 1200  cefTRIAXone (ROCEPHIN) 2 g in dextrose 5 % 50 mL IVPB     2 g 100 mL/hr over 30 Minutes Intravenous Every 24 hours 07/31/17 1141     07/31/17 1100  cefTRIAXone (ROCEPHIN) 1 g in  dextrose 5 % 50 mL IVPB  Status:  Discontinued     1 g 100 mL/hr over 30 Minutes Intravenous Every 24 hours 07/30/17 1133 07/31/17 1141   07/30/17 1800  metroNIDAZOLE (FLAGYL) IVPB 500 mg     500 mg 100 mL/hr over 60 Minutes Intravenous Every 8 hours 07/30/17 1133     07/30/17 0915  cefTRIAXone (ROCEPHIN) 1 g in dextrose 5 % 50 mL IVPB     1 g 100 mL/hr over 30 Minutes Intravenous  Once 07/30/17 0902 07/30/17 1054   07/30/17 0915  metroNIDAZOLE (FLAGYL) IVPB 500 mg     500 mg 100 mL/hr over 60 Minutes Intravenous  Once 07/30/17 0902 07/30/17 1217      Assessment/Plan: Patient Active Problem List   Diagnosis Date Noted  . Pneumaturia   . Cholelithiasis with cholecystitis 07/30/2017  . Hyperlipidemia   . 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 Uterine Prolapse, First Degree 02/12/2016  . Coronary artery disease due to lipid rich plaque 11/09/2014  . Chronic Systolic Heart Failure 69/67/8938  . Dyspnea 02/18/2014  . Tobacco use disorder 01/15/2014  . Hyperglycemia 10/15/2013  . Hyponatremia 10/15/2013  . Diabetes Mellitus, Type 2 10/15/2013  . Hypertension 10/15/2013  . Hypokalemia 10/15/2013  . Cerebrovascular Accidents (CVA) 05/10/2013  Labs better  Discussed with medical service  Increase risk of complications but felt stable for cholecystectomy Discussed cardiac issues with  Medical service as well- they feel she is as tuned up as she can be at this point and can proceed with surgery  May need open procedure given previous surgery   The procedure has been discussed with the patient. Operative and non operative treatments have been discussed. Risks of surgery include bleeding, infection,  Common bile duct injury,  Injury to the stomach,liver, colon,small intestine, abdominal wall,  Diaphragm,  Major blood vessels,  And the need for an open procedure.  Other risks include  worsening of medical problems, death,  DVT and pulmonary embolism, and cardiovascular events.   Medical options have also been discussed. The patient has been informed of long term expectations of surgery and non surgical options,  The patient agrees to proceed.     LOS: 3 days    Sloan Galentine A. 08/02/2017

## 2017-08-02 NOTE — Progress Notes (Signed)
   Subjective: Continues to have RUQ abdominal pain and nausea. Has not had any episodes of emesis. Surgery in room prior, will go for surgery today. Nervous about the surgery. Discussed that from the tests we have done her heart appears to be stable. Discussed the treatment plan and all questions/concerns were answered.   Denies SOB, chest pain, palpitations.  Objective: Vital signs in last 24 hours: Vitals:   08/01/17 0439 08/01/17 1449 08/01/17 2142 08/02/17 0610  BP: 132/69 (!) 141/75 138/75 132/71  Pulse: 79 73 70 66  Resp: 18  17 18   Temp: 99.1 F (37.3 C) 98.2 F (36.8 C) 98.4 F (36.9 C) 98.3 F (36.8 C)  TempSrc: Oral Oral Oral   SpO2: 97% 96% 97% 96%  Weight: 186 lb 1.6 oz (84.4 kg)   184 lb 15.5 oz (83.9 kg)  Height:       Physical Exam  Constitutional: She is oriented to person, place, and time. She appears well-developed and well-nourished.  Cardiovascular: Normal rate, regular rhythm, normal heart sounds and intact distal pulses.   Pulmonary/Chest: Effort normal and breath sounds normal.  Abdominal: Soft. Bowel sounds are normal. There is tenderness (RUQ). There is no rebound and no guarding.  Musculoskeletal: Normal range of motion. She exhibits no edema.  Neurological: She is alert and oriented to person, place, and time.   Assessment/Plan: Principal Problem:   Cholelithiasis with cholecystitis Active Problems:   Diabetes Mellitus, Type 2   Hypertension   Chronic Systolic Heart Failure   Pneumaturia  Acute Cholecystitis  - CT abdomen illustrated gallbladder distention and wall thickening with pericholecystic edema, gallstones, and no ductal dilation. - LFTs and leukocytosis improving, trending down - Surgery consulted; surgery today, will follow post-op - Continue antibiotic coverage with Rocephin and Flagyl (Day # 4)  Urinary Tract Infection/ Pneumaturia  - Denies symptoms of dysuria or increased frequency  - UA significant for multiple bacteria,  moderate hgb, and trace leukocytes - Urine cultures growing Klebsiella pneumoniae - sensitive to Ceftriaxone   - Most common cause for pneumaturia is a colovesical fistula however, it can be caused by gas producing bacteria   Bacteremia: 1/2 Blood Cultures positive for Klebsiella pneumoniae and enterobacteriacae species - Covered by Ceftriaxone 2 grams q24h  Hyponatremia  - Na 131 this AM - Continue IVF  T2DM: - Lantus 25 units qhs - Moderate SSI  Acute Kidney Injury. Resolved  Diet: NPO VTE [id number]:[id number] Code Status: Full   Dispo: Anticipated discharge in approximately >1 day(s).   Ina Homes, MD 08/02/2017, 6:54 AM My Pager: [phone]

## 2017-08-02 NOTE — H&P (View-Only) (Signed)
Subjective/Chief Complaint: abdominal pain Pt with abdominal pain   Objective: Vital signs in last 24 hours: Temp:  [98.2 F (36.8 C)-98.4 F (36.9 C)] 98.3 F (36.8 C) (09/10 0610) Pulse Rate:  [66-73] 66 (09/10 0610) Resp:  [17-18] 18 (09/10 0610) BP: (132-141)/(71-75) 132/71 (09/10 0610) SpO2:  [96 %-97 %] 96 % (09/10 0610) Weight:  [83.9 kg (184 lb 15.5 oz)] 83.9 kg (184 lb 15.5 oz) (09/10 0610) Last BM Date: 08/01/17  Intake/Output from previous day: 09/09 0701 - 09/10 0700 In: 595 [P.O.:120; I.V.:75; IV Piggyback:400] Out: 800 [Urine:800] Intake/Output this shift: No intake/output data recorded.  GI: tender RUQ   Lab Results:   Recent Labs  07/31/17 0342 08/01/17 0459  WBC 11.5* 11.6*  HGB 10.5* 12.1  HCT 31.2* 35.2*  PLT 199 216   BMET  Recent Labs  08/01/17 0459 08/02/17 0402  NA 131* 131*  K 4.2 3.8  CL 99* 99*  CO2 21* 24  GLUCOSE 194* 208*  BUN 11 9  CREATININE 0.70 0.59  CALCIUM 6.9* 6.6*   PT/INR  Recent Labs  07/31/17 0342  LABPROT 14.4  INR 1.13   ABG No results for input(s): PHART, HCO3 in the last 72 hours.  Invalid input(s): PCO2, PO2  Studies/Results: Dg Chest Port 1 View  Result Date: 07/31/2017 CLINICAL DATA:  Cough. EXAM: PORTABLE CHEST 1 VIEW COMPARISON:  February 18, 2014 FINDINGS: Cardiomegaly. Stable torturous thoracic aorta. Atelectasis or scar in the left lung base. No other interval changes or acute abnormalities. IMPRESSION: No cause for the patient's cough identified. A linear opacity in left base is consistent with scar or atelectasis. Stable cardiomegaly. Electronically Signed   By: Dorise Bullion III M.D   On: 07/31/2017 10:14    Anti-infectives: Anti-infectives    Start     Dose/Rate Route Frequency Ordered Stop   07/31/17 1200  cefTRIAXone (ROCEPHIN) 2 g in dextrose 5 % 50 mL IVPB     2 g 100 mL/hr over 30 Minutes Intravenous Every 24 hours 07/31/17 1141     07/31/17 1100  cefTRIAXone (ROCEPHIN) 1 g in  dextrose 5 % 50 mL IVPB  Status:  Discontinued     1 g 100 mL/hr over 30 Minutes Intravenous Every 24 hours 07/30/17 1133 07/31/17 1141   07/30/17 1800  metroNIDAZOLE (FLAGYL) IVPB 500 mg     500 mg 100 mL/hr over 60 Minutes Intravenous Every 8 hours 07/30/17 1133     07/30/17 0915  cefTRIAXone (ROCEPHIN) 1 g in dextrose 5 % 50 mL IVPB     1 g 100 mL/hr over 30 Minutes Intravenous  Once 07/30/17 0902 07/30/17 1054   07/30/17 0915  metroNIDAZOLE (FLAGYL) IVPB 500 mg     500 mg 100 mL/hr over 60 Minutes Intravenous  Once 07/30/17 0902 07/30/17 1217      Assessment/Plan: Patient Active Problem List   Diagnosis Date Noted  . Pneumaturia   . Cholelithiasis with cholecystitis 07/30/2017  . Hyperlipidemia   . 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 Uterine Prolapse, First Degree 02/12/2016  . Coronary artery disease due to lipid rich plaque 11/09/2014  . Chronic Systolic Heart Failure 41/74/0814  . Dyspnea 02/18/2014  . Tobacco use disorder 01/15/2014  . Hyperglycemia 10/15/2013  . Hyponatremia 10/15/2013  . Diabetes Mellitus, Type 2 10/15/2013  . Hypertension 10/15/2013  . Hypokalemia 10/15/2013  . Cerebrovascular Accidents (CVA) 05/10/2013  Labs better  Discussed with medical service  Increase risk of complications but felt stable for cholecystectomy Discussed cardiac issues with  Medical service as well- they feel she is as tuned up as she can be at this point and can proceed with surgery  May need open procedure given previous surgery   The procedure has been discussed with the patient. Operative and non operative treatments have been discussed. Risks of surgery include bleeding, infection,  Common bile duct injury,  Injury to the stomach,liver, colon,small intestine, abdominal wall,  Diaphragm,  Major blood vessels,  And the need for an open procedure.  Other risks include  worsening of medical problems, death,  DVT and pulmonary embolism, and cardiovascular events.   Medical options have also been discussed. The patient has been informed of long term expectations of surgery and non surgical options,  The patient agrees to proceed.     LOS: 3 days    Anabeth Chilcott A. 08/02/2017

## 2017-08-02 NOTE — Progress Notes (Signed)
Inpatient Diabetes Program Recommendations  AACE/ADA: New Consensus Statement on Inpatient Glycemic Control (2015)  Target Ranges:  Prepandial:   less than 140 mg/dL      Peak postprandial:   less than 180 mg/dL (1-2 hours)      Critically ill patients:  140 - 180 mg/dL   Results for DONTASIA, MIRANDA (MRN [id number]) as of 08/02/2017 11:29  Ref. Range 07/30/2017 06:34 07/30/2017 13:30 07/31/2017 03:42 08/01/2017 04:59 08/02/2017 04:02 08/02/2017 09:51  Glucose Latest Ref Range: 65 - 99 mg/dL 424 (H) 275 (H) 170 (H) 194 (H) 208 (H) 208 (H)  Results for KAYSEE, HERGERT (MRN [id number]) as of 08/02/2017 11:29  Ref. Range 02/13/2016 09:40  Hemoglobin A1C Unknown 14.4   Review of Glycemic Control  Diabetes history: DM2 Outpatient Diabetes medications: Lantus 50 units QHS, Humalog 18 units TID with meals, Januvia 100 mg daily, Metformin XR 1000 mg BID Current orders for Inpatient glycemic control: Lantus 25 units QHS, Novolog 0-15 units TID with meals  Inpatient Diabetes Program Recommendations: Insulin - Basal: Please consider increasing Lantus to 28 units QHS. Correction (SSI): Please consider ordering Novolog 0-5 units QHS for bedtime correction. Insulin - Meal Coverage: When diet resumed, please consider ordering Novolog 5 units TID with meals for meal coverage. HgbA1C: Please consider ordering an A1C to evaluate glycemic control over the past 2-3 months.  Thanks, Barnie Alderman, RN, MSN, CDE Diabetes Coordinator Inpatient Diabetes Program [phone] (Team Pager from 8am to 5pm)

## 2017-08-02 NOTE — Anesthesia Procedure Notes (Signed)
Procedure Name: Intubation Date/Time: 08/02/2017 11:35 AM Performed by: Salli Quarry Lennix Rotundo Pre-anesthesia Checklist: Patient identified, Emergency Drugs available, Suction available and Patient being monitored Patient Re-evaluated:Patient Re-evaluated prior to induction Oxygen Delivery Method: Circle System Utilized Preoxygenation: Pre-oxygenation with 100% oxygen Induction Type: IV induction, Rapid sequence and Cricoid Pressure applied Laryngoscope Size: Mac and 3 Grade View: Grade I Tube type: Oral Tube size: 7.0 mm Number of attempts: 1 Airway Equipment and Method: Stylet Placement Confirmation: ETT inserted through vocal cords under direct vision,  positive ETCO2 and breath sounds checked- equal and bilateral Secured at: 22 cm Tube secured with: Tape Dental Injury: Teeth and Oropharynx as per pre-operative assessment

## 2017-08-02 NOTE — Interval H&P Note (Signed)
History and Physical Interval Note:  08/02/2017 11:07 AM  Alicia [name]  has presented today for surgery, with the diagnosis of cholelithiasis and cholecystitis  The various methods of treatment have been discussed with the patient and family. After consideration of risks, benefits and other options for treatment, the patient has consented to  Procedure(s): LAPAROSCOPIC CHOLECYSTECTOMY WITH INTRAOPERATIVE CHOLANGIOGRAM (N/A) CHOLECYSTECTOMY (N/A) as a surgical intervention .  The patient's history has been reviewed, patient examined, no change in status, stable for surgery.  I have reviewed the patient's chart and labs.  Questions were answered to the patient's satisfaction.     Joelle Flessner A.

## 2017-08-02 NOTE — Op Note (Signed)
Preoperative diagnosis: Acute on chronic cholecystitis  Postoperative diagnosis: Same  Procedure: Laparoscopic cholecystectomy  Surgeon: Erroll Luna M.D.  Anesthesia: Gen. with local  Specimen: Gallbladder to pathology  Drains: None  EBL: 30 mL  Indications for procedure: The patient presents for laparoscopic cholecystectomy for acute on chronic cholecystitis. She's been cleared to the medical service a weekend and her medical issues have been adequately managed. She is a history of chronic cholecystitis. She was admitted with acute exacerbation of abdominal pain as well as fever and chills. Workup revealed what appeared to be acute on chronic cholecystitis as well as a urine tract infection. The above were stabilized presents today for definitive treatment of her gallbladder disease.The procedure has been discussed with the patient. Operative and non operative treatments have been discussed. Risks of surgery include bleeding, infection,  Common bile duct injury,  Injury to the stomach,liver, colon,small intestine, abdominal wall,  Diaphragm,  Major blood vessels,  And the need for an open procedure.  Other risks include worsening of medical problems, death,  DVT and pulmonary embolism, and cardiovascular events.   Medical options have also been discussed. The patient has been informed of long term expectations of surgery and non surgical options,  The patient agrees to proceed.     Description of procedure: The patient was met in the holding area and questions were answered. The procedure was reviewed with the patient as well as risks, benefits and long term expectations as well as other potential therapeutic options.  She's brought back to the operating room and placed on the OR table. After adequate general anesthesia the abdomen was prepped and draped in a sterile fashion. Timeout was done. She to previous laparotomy and therefore 5 noted right upper quadrant Optiview was used. This was  passed to the abdominal wall layers without injury into the abdominal cavity where pneumoperitoneum was established. A 5 mm scope was placed. No evidence of injury to the bowels insertion of this trocar. She had remarkably very few intra-abdominal lesions except for some just under the umbilicus. I placed 11 mm port just left the umbilicus under direct vision. I placed a second 11 mm port in the epigastrium. A second 5 mm port placed in the right upper quadrant. Adhesions the small bowel to the gallbladder. She has signs of acute on chronic cholecystitis. The gallbladder was grabbed by its dome. He was retracted toward the patient's right shoulder. A second graspers used to grab the infundibulum. She had dense scar tissue in a gallbladder for multiple chronic attacks years. We carefully dissected out the infundibulum. There is significant scarring of the cystic duct therefore dissected around the infundibulum circumferentially to obtain a critical view. Given the scar tissue I did not want dissected lower therefore opted to take the gallbladder the distal infundibulum. I could not perform injury of this point due to significant scar tissue. Clips are placed across the distal portion of the gallbladder an Endoloop was placed around this for closure. There is no evidence of bile leakage from this closure. The cystic artery was identified and clipped. Dissection was carried along the posterior wall gallbladder with was densely adherent and showed signs of chronic scarring. There are no other structures encountered. The posterior wall was peeled out of the liver bed carefully flexed cautery. The gallbladder is quite necrotic back here and came out in a piecemeal fashion. All: Her material extends replaced into an Endo Catch bag at this point. The gallbladder bed was hemostatic with cautery. Irrigation  was used to the gallbladder fossa was irrigated with no signs or bile leak. The gallbladder was removed he gets back to  the lower 11 mm port site. I then closed this with an into suture passer using 0 Vicryl. I reexamined gallbladder fossa and 7 insult no signs of bleeding or bile leakage. All ports are then removed. There is no signs of injury to any internal viscera. 4 Monocryl used to close skin. Dermabond applied. All final counts found to be correct. Patient was awoke extubated  And  Taken to  recovery in satisfactory condition.

## 2017-08-02 NOTE — Anesthesia Preprocedure Evaluation (Addendum)
Anesthesia Evaluation  Patient identified by MRN, date of birth, ID band Patient awake    Reviewed: Allergy & Precautions, H&P , NPO status , Patient's Chart, lab work & pertinent test results, reviewed documented beta blocker date and time   Airway Mallampati: II  TM Distance: >3 FB Neck ROM: Full    Dental  (+) Missing, Dental Advisory Given, Poor Dentition, Edentulous Upper   Pulmonary Current Smoker, former smoker,    Pulmonary exam normal        Cardiovascular hypertension, Pt. 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  - Left ventricle: The cavity size was normal. There was moderate concentric hypertrophy. Systolic function was severely reduced. The estimated ejection fraction was in the range of 20% to 25%. There is hypokinesis of the inferior myocardium. - Mitral valve: There was mild regurgitation. - Left atrium: The atrium was mildly dilated.     Reproductive/Obstetrics                         Anesthesia Physical  Anesthesia Plan  ASA: III  Anesthesia Plan: General   Post-op Pain Management:    Induction: Intravenous and Rapid sequence  PONV Risk Score and Plan: 1 and Ondansetron, Dexamethasone, Treatment may vary due to age or medical condition and Midazolam  Airway Management Planned: Oral ETT  Additional Equipment:   Intra-op Plan:   Post-operative Plan: Extubation in OR  Informed Consent:   Plan Discussed with:   Anesthesia Plan Comments:         Anesthesia Quick Evaluation

## 2017-08-02 NOTE — Progress Notes (Signed)
Medical attending: I examined this patient today together with resident physician Dr. Ina Homes and I concur with his evaluation and management plan which we discussed together. She is clinically stable on antibiotics. Surgeon at bedside.  Management discussed.  She will be taken for cholecystectomy today.  Patient understands that she is at increased risk for surgical complications in view of her underlying cardiac condition but currently she has no signs or symptoms of cardiac instability and I feel she is medically stable to proceed with surgery as planned.

## 2017-08-02 NOTE — Transfer of Care (Signed)
Immediate Anesthesia Transfer of Care Note  Patient: Alicia [name]  Procedure(s) Performed: Procedure(s): LAPAROSCOPIC CHOLECYSTECTOMY (N/A)  Patient Location: PACU  Anesthesia Type:General  Level of Consciousness: awake, alert  and patient cooperative  Airway & Oxygen Therapy: Patient Spontanous Breathing  Post-op Assessment: Report given to RN and Post -op Vital signs reviewed and stable  Post vital signs: Reviewed and stable  Last Vitals:  Vitals:   08/01/17 2142 08/02/17 0610  BP: 138/75 132/71  Pulse: 70 66  Resp: 17 18  Temp: 36.9 C 36.8 C  SpO2: 97% 96%    Last Pain:  Vitals:   08/02/17 0757  TempSrc:   PainSc: Asleep      Patients Stated Pain Goal: 2 (07/07/47 1856)  Complications: No apparent anesthesia complications

## 2017-08-03 ENCOUNTER — Encounter (HOSPITAL_COMMUNITY): Payer: Self-pay | Admitting: General Practice

## 2017-08-03 ENCOUNTER — Other Ambulatory Visit (HOSPITAL_COMMUNITY): Payer: Medicare Other

## 2017-08-03 DIAGNOSIS — B961 Klebsiella pneumoniae [K. pneumoniae] as the cause of diseases classified elsewhere: Secondary | ICD-10-CM

## 2017-08-03 DIAGNOSIS — N390 Urinary tract infection, site not specified: Secondary | ICD-10-CM

## 2017-08-03 LAB — CBC
HEMATOCRIT: 30.1 % — AB (ref 36.0–46.0)
HEMOGLOBIN: 10.2 g/dL — AB (ref 12.0–15.0)
MCH: 25.6 pg — ABNORMAL LOW (ref 26.0–34.0)
MCHC: 33.9 g/dL (ref 30.0–36.0)
MCV: 75.6 fL — ABNORMAL LOW (ref 78.0–100.0)
Platelets: 309 10*3/uL (ref 150–400)
RBC: 3.98 MIL/uL (ref 3.87–5.11)
RDW: 14.6 % (ref 11.5–15.5)
WBC: 13.6 10*3/uL — AB (ref 4.0–10.5)

## 2017-08-03 LAB — BASIC METABOLIC PANEL
ANION GAP: 9 (ref 5–15)
BUN: 10 mg/dL (ref 6–20)
CALCIUM: 6.6 mg/dL — AB (ref 8.9–10.3)
CO2: 24 mmol/L (ref 22–32)
Chloride: 96 mmol/L — ABNORMAL LOW (ref 101–111)
Creatinine, Ser: 0.61 mg/dL (ref 0.44–1.00)
GFR calc Af Amer: >60 mL/min (ref >60)
Glucose, Bld: 151 mg/dL — ABNORMAL HIGH (ref 65–99)
POTASSIUM: 3.5 mmol/L (ref 3.5–5.1)
SODIUM: 129 mmol/L — AB (ref 135–145)

## 2017-08-03 LAB — GLUCOSE, CAPILLARY
Glucose-Capillary: 156 mg/dL — ABNORMAL HIGH (ref 65–99)
Glucose-Capillary: 247 mg/dL — ABNORMAL HIGH (ref 65–99)

## 2017-08-03 LAB — HEMOGLOBIN A1C
HEMOGLOBIN A1C: 11 % — AB (ref 4.8–5.6)
Mean Plasma Glucose: 269 mg/dL

## 2017-08-03 MED ORDER — INSULIN GLARGINE 100 UNIT/ML SOLOSTAR PEN
50.0000 [IU] | PEN_INJECTOR | Freq: Every day | SUBCUTANEOUS | 3 refills | Status: DC
Start: 2017-08-03 — End: 2018-10-20

## 2017-08-03 MED ORDER — CEPHALEXIN 500 MG PO CAPS: 500.0000 mg | ORAL_CAPSULE | Freq: Two times a day (BID) | ORAL | 0 refills | Status: AC

## 2017-08-03 MED ORDER — HYDROCODONE-ACETAMINOPHEN 5-325 MG PO TABS: 1.0000 | ORAL_TABLET | Freq: Four times a day (QID) | ORAL | 0 refills | Status: AC | PRN

## 2017-08-03 MED ORDER — INSULIN LISPRO 100 UNIT/ML ~~LOC~~ SOLN: 10.0000 [IU] | Freq: Three times a day (TID) | SUBCUTANEOUS | 3 refills | Status: DC

## 2017-08-03 MED ORDER — ENOXAPARIN SODIUM 40 MG/0.4ML ~~LOC~~ SOLN
40.0000 mg | SUBCUTANEOUS | Status: DC
  Administered 2017-08-03: 40 mg via SUBCUTANEOUS
  Filled 2017-08-03: qty 0.4

## 2017-08-03 NOTE — Consult Note (Signed)
[facility] CM Primary Care Navigator  08/03/2017  Alicia [name] 08/14/1956 [id number]   Met with patient at the bedside to identify possible discharge needs. Patient reports having nausea/ vomiting, diarrhea and loss of appetite which led to this admission/ surgery. Patientmentioned she was seeing Dr. Sandi Mariscal as her primary care provider with [facility] previously, but states will be seeing another (new) primary provider on post hospital follow-up.  Dr. Tarri Abernethy with [facility] called to speak with patient during this visit and he confirmed that he will be the new primary care provider for her. Spoke with Dr. Tarri Abernethy and discussed regarding chronic health issues needing follow-up after discharge (DM with A1c of 14.4 and HF- has cardiologist). He states that he will evaluate patient on post discharge follow-upand is aware to make referral to [facility] CM if deemed appropriate and necessary for services by then.   [facility].   Patienthas beenmanaging her medicationsat home straight out of the containers.   Patient reports driving prior to admission/ surgery buther daughter Herbert Seta) will beprovidingtransportation to herdoctors'appointments after discharge.  Patient lives with daughter who will be herprimary caregiver at home.  Anticipated discharge plan is home according to patient.  Patient voiced understanding to follow-up withprimary care provider's officewhen shegets home fora post discharge visitwithin a week or sooner if needed. Patient letter (with PCP's contact number) was provided as her reminder.  Explained to patient regarding[facility] services available for health management at homebutstates she "will wait until seen by new primary care provider and will go from there".   Patient expressed understandingto seek referral from primary  care provider to [facility] care management if necessaryand deemed appropriate for services in the future.  [facility] care management information provided for future needs that may arise.   For questions, please contact:  Dannielle Huh, BSN, RN- [facility] Primary Care Navigator  Telephone: [phone] [place]

## 2017-08-03 NOTE — Discharge Instructions (Addendum)
Please come see me at the clinic in 1-2 weeks. If you have not heard from the clinic within the next day or so please call the clinic at [phone] to schedule an appointment with me (Dr. Tarri Abernethy).   Please stop taking you Glimepride. Follow the diabetes regimen outlined below: - Metformin 1000 mg (2 tablets) twice a day with food  - Bydureon injection once weekly - Januvia 100 mg once daily  - Lantus 50 units at bedtime  - Humalog 10 units with each meal (insurance will not cover novolog)  Please start checking your blood sugars 4 times a day. Also work towards weight loss, it would be the most beneficial and possibly help [name] come off some medications.  The clinic with call you to schedule a follow-up appointment. If you begin to experience low blood sugars please call the clinic at [phone] and we can make adjustments to your insulin.   Please follow-up with surgery as outlined below.  Please arrive at least 30 min before your appointment to complete your check in paperwork.  If you are unable to arrive 30 min prior to your appointment time we may have to cancel or reschedule you.  LAPAROSCOPIC SURGERY: POST OP INSTRUCTIONS  1. DIET: Follow a light bland diet the first 24 hours after arrival home, such as soup, liquids, crackers, etc. Be sure to include lots of fluids daily. Avoid fast food or heavy meals as your are more likely to get nauseated. Eat a low fat the next few days after surgery.  2. Take your usually prescribed home medications unless otherwise directed. 3. PAIN CONTROL:  1. Pain is best controlled by a usual combination of three different methods TOGETHER:  1. Ice/Heat 2. Over the counter pain medication 3. Prescription pain medication 2. Most patients will experience some swelling and bruising around the incisions. Ice packs or heating pads (30-60 minutes up to 6 times a day) will help. Use ice for the first few days to help decrease swelling and bruising, then switch  to heat to help relax tight/sore spots and speed recovery. Some people prefer to use ice alone, heat alone, alternating between ice & heat. Experiment to what works for you. Swelling and bruising can take several weeks to resolve.  3. It is helpful to take an over-the-counter pain medication regularly for the first few weeks. Choose one of the following that works best for you:  1. Naproxen (Aleve, etc) Two 220mg  tabs twice a day 2. Ibuprofen (Advil, etc) Three 200mg  tabs four times a day (every meal & bedtime) 3. Acetaminophen (Tylenol, etc) 500-650mg  four times a day (every meal & bedtime) 4. A prescription for pain medication (such as oxycodone, hydrocodone, etc) should be given to you upon discharge. Take your pain medication as prescribed.  1. If you are having problems/concerns with the prescription medicine (does not control pain, nausea, vomiting, rash, itching, etc), please call us [phone] to see if we need to switch you to a different pain medicine that will work better for you and/or control your side effect better. 2. If you need a refill on your pain medication, please contact your pharmacy. They will contact our office to request authorization. Prescriptions will not be filled after 5 pm or on week-ends. 4. Avoid getting constipated. Between the surgery and the pain medications, it is common to experience some constipation. Increasing fluid intake and taking a fiber supplement (such as Metamucil, Citrucel, FiberCon, MiraLax, etc) 1-2 times a day regularly will usually  help prevent this problem from occurring. A mild laxative (prune juice, Milk of Magnesia, MiraLax, etc) should be taken according to package directions if there are no bowel movements after 48 hours.  5. Watch out for diarrhea. If you have many loose bowel movements, simplify your diet to bland foods & liquids for a few days. Stop any stool softeners and decrease your fiber supplement. Switching to mild anti-diarrheal  medications (Kayopectate, Pepto Bismol) can help. If this worsens or does not improve, please call us. 6. Wash / shower every day. You may shower over the dressings as they are waterproof. Continue to shower over incision(s) after the dressing is off. If there is glue over the incisions try not to pick it off, let it fall off naturally. 7. Remove your waterproof bandages 2 days after surgery. You may leave the incision open to air. You may replace a dressing/Band-Aid to cover the incision for comfort if you wish.  8. ACTIVITIES as tolerated:  1. You may resume regular (light) daily activities beginning the next day--such as daily self-care, walking, climbing stairs--gradually increasing activities as tolerated. If you can walk 30 minutes without difficulty, it is safe to try more intense activity such as jogging, treadmill, bicycling, low-impact aerobics, swimming, etc. 2. Save the most intensive and strenuous activity for last such as sit-ups, heavy lifting, contact sports, etc Refrain from any heavy lifting or straining until you are off narcotics for pain control. For the first 2-3 weeks do not lift over 10-15lb.  3. DO NOT PUSH THROUGH PAIN. Let pain be your guide: If it hurts to do something, don't do it. Pain is your body warning you to avoid that activity for another week until the pain goes down. 4. You may drive when you are no longer taking prescription pain medication, you can comfortably wear a seatbelt, and you can safely maneuver your car and apply brakes. 5. You may have sexual intercourse when it is comfortable.  9. FOLLOW UP in our office  1. Please call [facility] at [phone] to set up an appointment to see your surgeon in the office for a follow-up appointment approximately 2-3 weeks after your surgery. 2. Make sure that you call for this appointment the day you arrive home to insure a convenient appointment time.      10. IF YOU HAVE DISABILITY OR FAMILY LEAVE FORMS, BRING THEM TO THE                OFFICE FOR PROCESSING.   WHEN TO CALL us [phone]:  1. Poor pain control 2. Reactions / problems with new medications (rash/itching, nausea, etc)  3. Fever over 101.5 F (38.5 C) 4. Inability to urinate 5. Nausea and/or vomiting 6. Worsening swelling or bruising 7. Continued bleeding from incision. 8. Increased pain, redness, or drainage from the incision  The clinic staff is available to answer your questions during regular business hours (8:30am-5pm). Please dont hesitate to call and ask to speak to one of our nurses for clinical concerns.  If you have a medical emergency, go to the nearest emergency room or call 911.  A surgeon from [facility] is always on call at the [facility], [place], [place], [place], [place] [address] ?  MAIN: [phone] ? TOLL FREE: [phone] ?  FAX [phone]  [email or web address]

## 2017-08-03 NOTE — Anesthesia Postprocedure Evaluation (Signed)
Anesthesia Post Note  Patient: [name]  Procedure(s) Performed: Procedure(s) (LRB): LAPAROSCOPIC CHOLECYSTECTOMY (N/A)     Patient location during evaluation: PACU Anesthesia Type: General Level of consciousness: awake and alert Pain management: pain level controlled Vital Signs Assessment: post-procedure vital signs reviewed and stable Respiratory status: spontaneous breathing, nonlabored ventilation, respiratory function stable and patient connected to nasal cannula oxygen Cardiovascular status: blood pressure returned to baseline and stable Postop Assessment: no signs of nausea or vomiting Anesthetic complications: no    Last Vitals:  Vitals:   08/03/17 0405 08/03/17 1006  BP: (!) 148/69 133/81  Pulse: 98 95  Resp: 19 19  Temp: 36.7 C 36.7 C  SpO2: 93% 96%    Last Pain:  Vitals:   08/03/17 1111  TempSrc:   PainSc: 3    Pain Goal: Patients Stated Pain Goal: 2 (08/01/17 0946)               Alicia [name]

## 2017-08-03 NOTE — Progress Notes (Signed)
   Subjective: S/p lap chole. Doing well this AM. Abdominal pain is significantly improved with no further nausea. Has had a bowel movement this morning. Discussed the treatment plan. She is agreeable. All questions and concerns answered.   Concerns about her diabetes regimen, she is takes lantus in the evening and is supposed to be on a sliding scale for meal-time insulin. She is unsure how to use her sliding scale. She gives meal time insulin based on how she feels instead of basing it on her blood sugar. She only checks her blood sugar twice a day, before bed and in the AM. States that her blood sugar before bed is the same as when she wakes up in the AM. Tries to avoid sugary drinks and sugary foods. Lunch tends to be her biggest meal.  Objective: Vital signs in last 24 hours: Vitals:   08/02/17 1357 08/02/17 1656 08/02/17 2138 08/03/17 0405  BP: (!) 141/66 140/68 (!) 141/79 (!) 148/69  Pulse: 74 81 89 98  Resp: 18 18 20 19   Temp: 98.8 F (37.1 C) 98.2 F (36.8 C) 99 F (37.2 C) 98.1 F (36.7 C)  TempSrc: Oral Oral Oral Oral  SpO2: 96% 94% 93% 93%  Weight:      Height:       Physical Exam  Constitutional: She is oriented to person, place, and time. She appears well-developed and well-nourished.  Cardiovascular: Normal rate, regular rhythm, normal heart sounds and intact distal pulses.   Pulmonary/Chest: Effort normal and breath sounds normal.  Abdominal: Soft. Bowel sounds are normal. There is no rebound and no guarding.  3 small incisions noted. 2 in the RUQ and 1 around the umbilicus, with no drainage or bleeding  Musculoskeletal: Normal range of motion. She exhibits no edema.  Neurological: She is alert and oriented to person, place, and time.   Assessment/Plan: Principal Problem:   Cholelithiasis with cholecystitis Active Problems:   Diabetes Mellitus, Type 2   Hypertension   Chronic Systolic Heart Failure   Pneumaturia  Acute Cholecystitis  - CT abdomen illustrated  gallbladder distention and wall thickening with pericholecystic edema, gallstones, and no ductal dilation. - LFTs and leukocytosis improving, trending down - Surgery consulted; s/p lap chole on 09/10 with no immediate complications - Continue antibiotic coverage with Rocephin (Day # 5) - Discontinue Flagyl  Urinary Tract Infection/ Pneumaturia  - Denies symptoms of dysuria or increased frequency  - UA significant for multiple bacteria, moderate hgb, and trace leukocytes - Urine cultures growing Klebsiella pneumoniae - sensitive to Ceftriaxone - Most common cause for pneumaturia is a colovesical fistula however, it can be caused by gas producing bacteria   Bacteremia: 1/2 Blood Cultures positive for Klebsiella pneumoniae and enterobacteriacae species - Covered by Ceftriaxone 2 grams q24h - Likely from UTI, will treat empirically for 10 days   T2DM: - Lantus 28 units qhs - Has been requiring approximately 20 units Aspart and sugars continue to be around 200. She likely has sever insulin resistance, therefore, her total daily insulin requirement would be approximately 83 units per day. She is currently receiving 48 units per day. Will give meal times doses instead of sliding scale. - A1c pending  - Moderate SSI  Acute Kidney Injury. Resolved  Dispo: Anticipated discharge in approximately 0-1 day(s).   Ina Homes, MD 08/03/2017, 6:35 AM My Pager: [phone]

## 2017-08-03 NOTE — Progress Notes (Signed)
Patient read AVS and confirmed understanding of supplied information.  Presciption, AVS, and bactroban ointment placed inside of discharge envelope.  Patient escorted to front entrance via [facility] for D/C.

## 2017-08-03 NOTE — Progress Notes (Signed)
Patient ID: Alicia [name], female   DOB: 14-Mar-1956, 61 y.o.   MRN: [id number]  [facility] Surgery Progress Note  1 Day Post-Op  Subjective: CC- s/p lap chole No complaints this morning. States that her pain is much better since surgery. She is tolerating clear liquids. Denies n/v. BM this AM. Ambulating with no issues.  Objective: Vital signs in last 24 hours: Temp:  [97.3 F (36.3 C)-99 F (37.2 C)] 98.1 F (36.7 C) (09/11 0405) Pulse Rate:  [74-98] 98 (09/11 0405) Resp:  [18-20] 19 (09/11 0405) BP: (123-148)/(60-79) 148/69 (09/11 0405) SpO2:  [93 %-99 %] 93 % (09/11 0405) Weight:  [184 lb 15.5 oz (83.9 kg)] 184 lb 15.5 oz (83.9 kg) (09/10 1023) Last BM Date: 08/01/17  Intake/Output from previous day: 09/10 0701 - 09/11 0700 In: 1120 [P.O.:420; I.V.:700] Out: 1130 [Urine:1100; Blood:30] Intake/Output this shift: No intake/output data recorded.  PE: Gen:  Alert, NAD, pleasant Pulm:  effort normal Abd: Soft, mild distension, nontender, +BS, lap incisions C/D/I Psych: A&Ox3  Skin: no rashes noted, warm and dry  Lab Results:   Recent Labs  08/02/17 0951 08/03/17 0719  WBC 11.0* 13.6*  HGB 10.6* 10.2*  HCT 30.9* 30.1*  PLT 266 309   BMET  Recent Labs  08/02/17 0951 08/03/17 0719  NA 131* 129*  K 4.1 3.5  CL 97* 96*  CO2 26 24  GLUCOSE 208* 151*  BUN 11 10  CREATININE 0.65 0.61  CALCIUM 6.7* 6.6*   PT/INR No results for input(s): LABPROT, INR in the last 72 hours. CMP     Component Value Date/Time   NA 129 (L) 08/03/2017 0719   K 3.5 08/03/2017 0719   CL 96 (L) 08/03/2017 0719   CO2 24 08/03/2017 0719   GLUCOSE 151 (H) 08/03/2017 0719   BUN 10 08/03/2017 0719   CREATININE 0.61 08/03/2017 0719   CREATININE 0.63 08/28/2015 0926   CALCIUM 6.6 (L) 08/03/2017 0719   PROT 5.7 (L) 08/02/2017 0951   ALBUMIN 2.3 (L) 08/02/2017 0951   AST 60 (H) 08/02/2017 0951   ALT 142 (H) 08/02/2017 0951   ALKPHOS 247 (H) 08/02/2017 0951   BILITOT 1.4 (H)  08/02/2017 0951   GFRNONAA >60 08/03/2017 0719   GFRNONAA >89 02/27/2014 1149   GFRAA >60 08/03/2017 0719   GFRAA >89 02/27/2014 1149   Lipase     Component Value Date/Time   LIPASE 24 07/30/2017 1330       Studies/Results: No results found.  Anti-infectives: Anti-infectives    Start     Dose/Rate Route Frequency Ordered Stop   08/02/17 1000  ceFAZolin (ANCEF) IVPB 2g/100 mL premix     2 g 200 mL/hr over 30 Minutes Intravenous On call to O.R. 08/02/17 0946 08/02/17 1140   07/31/17 1200  cefTRIAXone (ROCEPHIN) 2 g in dextrose 5 % 50 mL IVPB     2 g 100 mL/hr over 30 Minutes Intravenous Every 24 hours 07/31/17 1141     07/31/17 1100  cefTRIAXone (ROCEPHIN) 1 g in dextrose 5 % 50 mL IVPB  Status:  Discontinued     1 g 100 mL/hr over 30 Minutes Intravenous Every 24 hours 07/30/17 1133 07/31/17 1141   07/30/17 1800  metroNIDAZOLE (FLAGYL) IVPB 500 mg  Status:  Discontinued     500 mg 100 mL/hr over 60 Minutes Intravenous Every 8 hours 07/30/17 1133 08/03/17 0914   07/30/17 0915  cefTRIAXone (ROCEPHIN) 1 g in dextrose 5 % 50 mL IVPB  1 g 100 mL/hr over 30 Minutes Intravenous  Once 07/30/17 0902 07/30/17 1054   07/30/17 0915  metroNIDAZOLE (FLAGYL) IVPB 500 mg     500 mg 100 mL/hr over 60 Minutes Intravenous  Once 07/30/17 0902 07/30/17 1217       Assessment/Plan Klebsiella pneumoniae UTI - on rocephin Bacteremia - some blood cultures + for Enterobacteriaceae and Klebsiella DM AKI - resolved Hyponatremia - on IVF  Acute on chronic cholecystitis S/p Laparoscopic cholecystectomy 9/10 Dr. Brantley Stage - POD 1 - tolerating diet, pain controlled, having bowel function  ID - rocephin 9/8>>day#4 for UTI/bacteremia FEN - IVF, carb modified diet VTE - SCDs, lovenox Foley - none Follow up - 2-3 weeks DOW clinic  Plan - Advance to carb modified diet. If patient tolerates solid food she would be ready for discharge from our perspective. She does not need antibiotics from  surgical standpoint. Follow-up in clinic in 2-3 weeks.   LOS: 4 days    Wellington Hampshire , [facility] 08/03/2017, 9:25 AM Pager: [phone] Consults: [phone] Mon-Fri 7:00 am-4:30 pm Sat-Sun 7:00 am-11:30 am

## 2017-08-03 NOTE — Discharge Summary (Signed)
Name: Alicia [name] MRN: [id number] DOB: 03/05/1956 61 y.o. PCP: Sandi Mariscal, MD  Date of Admission: 07/30/2017  6:11 AM Date of Discharge: 08/03/2017 Attending Physician: Annia Belt, MD  Discharge Diagnosis: 1. Acute Cholecystitis 2. Urinary Tract Infection / Pneumaturia  3. Type 2 Diabetes Mellitus   Principal Problem:   Cholelithiasis with cholecystitis Active Problems:   Diabetes Mellitus, Type 2   Hypertension   Chronic Systolic Heart Failure   Pneumaturia  Discharge Medications: Allergies as of 08/03/2017      Reactions   Other Other (See Comments)   Told not to take any dairy products-per MD      Medication List    STOP taking these medications   glimepiride 4 MG tablet Commonly known as:  AMARYL   insulin lispro 100 UNIT/ML KiwkPen Commonly known as:  HUMALOG Replaced by:  insulin lispro 100 UNIT/ML injection     TAKE these medications   amLODipine 10 MG tablet Commonly known as:  NORVASC Take 1 tablet (10 mg total) by mouth daily.   aspirin 81 MG tablet Take 81 mg by mouth daily.   atorvastatin 40 MG tablet Commonly known as:  LIPITOR Take 1 tablet (40 mg total) by mouth daily.   BYDUREON 2 MG Srer Generic drug:  Exenatide ER Inject 1 mL into the skin once a week. Saturdays   carvedilol 12.5 MG tablet Commonly known as:  COREG TAKE 1 TABLET BY MOUTH 2 TIMES DAILY.   cephALEXin 500 MG capsule Commonly known as:  KEFLEX Take 1 capsule (500 mg total) by mouth 2 (two) times daily.   furosemide 40 MG tablet Commonly known as:  LASIX Take 1 tablet (40 mg total) by mouth daily.   glucose blood test strip Commonly known as:  ACCU-CHEK AVIVA PLUS USE TO TEST BLOOD SUGARS AS DIRECTED BY PHYSICIAN   HYDROcodone-acetaminophen 5-325 MG tablet Commonly known as:  NORCO/VICODIN Take 1 tablet by mouth every 6 (six) hours as needed for moderate pain.   Insulin Glargine 100 UNIT/ML Solostar Pen Commonly known as:  LANTUS Inject 50 Units into  the skin daily at 10 pm.   insulin lispro 100 UNIT/ML injection Commonly known as:  HUMALOG Inject 0.1 mLs (10 Units total) into the skin 3 (three) times daily with meals. Replaces:  insulin lispro 100 UNIT/ML KiwkPen   JANUVIA 100 MG tablet Generic drug:  sitaGLIPtin Take 100 mg by mouth daily.   lisinopril 20 MG tablet Commonly known as:  PRINIVIL,ZESTRIL Take 1 tablet (20 mg total) by mouth daily.   metFORMIN 500 MG 24 hr tablet Commonly known as:  GLUCOPHAGE-XR Take 1,000 mg by mouth 2 (two) times daily.   TRUEPLUS LANCETS 28G Misc USE TO TEST BLOOD SUGARS AS DIRECTED BY PHYSICIAN   ULTICARE MICRO PEN NEEDLES 32G X 4 MM Misc Generic drug:  Insulin Pen Needle USE 4 TIMES DAILY TO INJECT INSULIN            Discharge Care Instructions        Start     Ordered   08/03/17 0000  insulin lispro (HUMALOG) 100 UNIT/ML injection  3 times daily with meals     08/03/17 1444   08/03/17 0000  Insulin Glargine (LANTUS) 100 UNIT/ML Solostar Pen  Daily at 10 pm     08/03/17 1444   08/03/17 0000  Increase activity slowly     08/03/17 1444   08/03/17 0000  Diet - low sodium heart healthy  08/03/17 1444   08/03/17 0000  HYDROcodone-acetaminophen (NORCO/VICODIN) 5-325 MG tablet  Every 6 hours PRN     08/03/17 1444   08/03/17 0000  cephALEXin (KEFLEX) 500 MG capsule  2 times daily     08/03/17 1444     Disposition and follow-up:   Alicia [name] was discharged from [facility] in Stable condition.  At the hospital follow up visit please address:  1.  Acute Cholecystitis. Ensure surgical sites are healing appropriately. Ensure she has followed up with GI. UTI. Ensure she has completed her antibiotic course. Diabetes. Discuss her diabetes regimen and review blood sugars.  2.  Labs / imaging needed at time of follow-up: None  3.  Pending labs/ test needing follow-up: None  Follow-up Appointments: Follow-up [facility], [place].  Call in 3 week(s).   Specialty:  General Surgery Why:  We are working on your appointment, please call to confirm. Contact information: [address] [place] [phone]       Ina Homes, MD Follow up.   Specialty:  Internal Medicine Contact information: [place] [id number] [place] Hospital Course by problem list: Principal Problem:   Cholelithiasis with cholecystitis Active Problems:   Diabetes Mellitus, Type 2   Hypertension   Chronic Systolic Heart Failure   Pneumaturia   1. Acute Cholecystitis. AliciaWright is a 61 y.o. Female with a PMHx significant for Type 2 DM who presentedd to the ED with progressive RUQ abdominal pain, N/V, and diarrhea of 4 days duration. On admission she was found to be hypertensive, tachycardic, and afebrile. Initial labs indicated Na 122, K 3.1, glucose 424, AG 16, Cr 1.65, elevated LFTs, and a leukocytosis. CT abdomen illustrated gallbladder distention and wall thickening with pericholecystic edema, gallstones, and no ductal dilation. The CT abdomen also showed extensive air in the bladder. Surgery was consulted and she was started on IVF and Rocephin/Flagyl. Her labs continued to trend towards normal and she was taken for a laparoscopic cholecystectomy on 09/10. There were no immediate post-op complications. The following morning her abdominal pain had improved, her labs were within normal limits, she was tolerating oral intake, and had a bowel movement. She was discharge with instructions to follow-up with surgery in 2-3 weeks and the internal medicine clinic in 1-2 weeks.   2. Urinary Tract Infection / Pneumaturia. Alicia [name] endorses a history of passing gas through her urethra/vagina. She denied symptoms of dysuria, increased urinary frequency, or foul smelling urine. She does not have a history of IBD, but does have a history of bowel surgery. UA was dirty with  leukocytes and many bacteria. Cultures grew Klebsiella that was pan-sensitive. She was given an additional 5 day course of Keflex on discharge. If after treatment she still experiences pneumaturia she may need evaluation with CT Abdomen/Pelvis with oral contrast to assess for a colovesical fistula.   3. Type 2 Diabetes Mellitus. Alicia [name] has a long history of uncontrolled type 2 Diabetes mellitus. Her A1c in the hospital was 11.0. Medication list shows she should be taking bydureon, glimepride, lantus 40 units QHS, humalog 18 units with each meal, januvia, and metformin. However, she is only taking Bydureon, Glimepride, Lantus 50 units QHS, Januvia, and metformin. She checks her blood sugars twice daily. She is requesting follow-up with the internal medicine clinic. She was discharged on her Metformin 1000 mg BID, Bydureon  2 mg each week, Lantus 50 units QHS, Humalog 10 units with each meal, and Januvia 100 mg. Told to stop her glimepride and start checking her blood sugar 4 times a day. Given instructions to follow-up in clinic in 1-2 weeks.   Discharge Vitals:   BP 133/81 (BP Location: Left Arm)   Pulse 95   Temp 98 F (36.7 C) (Oral)   Resp 19   Ht 5\' 7"  (1.702 m)   Wt 184 lb 15.5 oz (83.9 kg)   LMP  (LMP Unknown)   SpO2 96%   BMI 28.97 kg/m   Pertinent Labs, Studies, and Procedures:  CT Abdomen  IMPRESSION: 1. Findings felt to represent a degree of acute cholecystitis. Gallbladder appears mildly distended with wall thickening and pericholecystic edema. Tiny gallstone noted. No appreciable biliary duct dilatation. 2. Extensive air noted in urinary bladder. If patient has not been recently instrumented, this finding raises concern for gas-forming infectious organism within the urine in the urinary bladder. 3. Left-sided colonic diverticulosis without diverticulitis. No bowel obstruction or bowel wall thickening. Appendix appears normal. No abscess in the abdomen or pelvis. 4. No  renal or ureteral calculus on either side. No hydronephrosis. 5.  Midline rectus wall thickening. 6. Aortoiliac atherosclerosis. Patchy calcification in the proximal mesenteric arterial vessels noted. Foci of coronary artery calcification also noted.  Discharge Instructions: Discharge Instructions    Diet - low sodium heart healthy    Complete by:  As directed    Increase activity slowly    Complete by:  As directed      Signed: Ina Homes, MD 08/03/2017, 2:56 PM   My Pager: [phone]

## 2017-08-04 LAB — CULTURE, BLOOD (ROUTINE X 2)
Culture: NO GROWTH
Special Requests: ADEQUATE

## 2017-08-16 ENCOUNTER — Ambulatory Visit (INDEPENDENT_AMBULATORY_CARE_PROVIDER_SITE_OTHER): Payer: Medicare Other | Admitting: Internal Medicine

## 2017-08-16 VITALS — BP 162/71 | HR 88 | Temp 98.0°F | Wt 178.7 lb

## 2017-08-16 DIAGNOSIS — Z8619 Personal history of other infectious and parasitic diseases: Secondary | ICD-10-CM

## 2017-08-16 DIAGNOSIS — Z09 Encounter for follow-up examination after completed treatment for conditions other than malignant neoplasm: Secondary | ICD-10-CM | POA: Diagnosis not present

## 2017-08-16 DIAGNOSIS — Z823 Family history of stroke: Secondary | ICD-10-CM

## 2017-08-16 DIAGNOSIS — B9689 Other specified bacterial agents as the cause of diseases classified elsewhere: Secondary | ICD-10-CM

## 2017-08-16 DIAGNOSIS — Z9851 Tubal ligation status: Secondary | ICD-10-CM

## 2017-08-16 DIAGNOSIS — Z9049 Acquired absence of other specified parts of digestive tract: Secondary | ICD-10-CM

## 2017-08-16 DIAGNOSIS — E785 Hyperlipidemia, unspecified: Secondary | ICD-10-CM | POA: Diagnosis not present

## 2017-08-16 DIAGNOSIS — Z8719 Personal history of other diseases of the digestive system: Secondary | ICD-10-CM | POA: Diagnosis not present

## 2017-08-16 DIAGNOSIS — Z87440 Personal history of urinary (tract) infections: Secondary | ICD-10-CM | POA: Diagnosis not present

## 2017-08-16 DIAGNOSIS — Z8249 Family history of ischemic heart disease and other diseases of the circulatory system: Secondary | ICD-10-CM

## 2017-08-16 DIAGNOSIS — Z794 Long term (current) use of insulin: Secondary | ICD-10-CM

## 2017-08-16 DIAGNOSIS — N390 Urinary tract infection, site not specified: Secondary | ICD-10-CM

## 2017-08-16 DIAGNOSIS — I10 Essential (primary) hypertension: Secondary | ICD-10-CM

## 2017-08-16 DIAGNOSIS — K8000 Calculus of gallbladder with acute cholecystitis without obstruction: Secondary | ICD-10-CM

## 2017-08-16 DIAGNOSIS — E871 Hypo-osmolality and hyponatremia: Secondary | ICD-10-CM

## 2017-08-16 DIAGNOSIS — B961 Klebsiella pneumoniae [K. pneumoniae] as the cause of diseases classified elsewhere: Secondary | ICD-10-CM

## 2017-08-16 DIAGNOSIS — Z23 Encounter for immunization: Secondary | ICD-10-CM | POA: Diagnosis not present

## 2017-08-16 DIAGNOSIS — E1165 Type 2 diabetes mellitus with hyperglycemia: Secondary | ICD-10-CM

## 2017-08-16 DIAGNOSIS — F17200 Nicotine dependence, unspecified, uncomplicated: Secondary | ICD-10-CM

## 2017-08-16 DIAGNOSIS — I5022 Chronic systolic (congestive) heart failure: Secondary | ICD-10-CM

## 2017-08-16 DIAGNOSIS — Z8742 Personal history of other diseases of the female genital tract: Secondary | ICD-10-CM

## 2017-08-16 DIAGNOSIS — R110 Nausea: Secondary | ICD-10-CM

## 2017-08-16 DIAGNOSIS — E1110 Type 2 diabetes mellitus with ketoacidosis without coma: Secondary | ICD-10-CM

## 2017-08-16 DIAGNOSIS — Z833 Family history of diabetes mellitus: Secondary | ICD-10-CM

## 2017-08-16 DIAGNOSIS — F17211 Nicotine dependence, cigarettes, in remission: Secondary | ICD-10-CM

## 2017-08-16 DIAGNOSIS — Z79899 Other long term (current) drug therapy: Secondary | ICD-10-CM

## 2017-08-16 DIAGNOSIS — Z8673 Personal history of transient ischemic attack (TIA), and cerebral infarction without residual deficits: Secondary | ICD-10-CM

## 2017-08-16 DIAGNOSIS — I428 Other cardiomyopathies: Secondary | ICD-10-CM

## 2017-08-16 DIAGNOSIS — I110 Hypertensive heart disease with heart failure: Secondary | ICD-10-CM

## 2017-08-16 MED ORDER — OMEPRAZOLE 20 MG PO CPDR: 20.0000 mg | DELAYED_RELEASE_CAPSULE | Freq: Every day | ORAL | 1 refills | Status: DC

## 2017-08-16 MED ORDER — INSULIN PEN NEEDLE 32G X 4 MM MISC: 3 refills | Status: DC

## 2017-08-16 NOTE — Assessment & Plan Note (Signed)
Patient discharged on bydureon 2mg  once weekly, Lantus 50 units qhs, humalog 10 units TID, januvia 50mg  daily, and metformin 1000mg  BID. A1c in the hospital was 11. She endorses compliance with this therapy. She did not bring her meter in today. She states that usually her AM CBGs are 100-120, and PM CBGs are ~200. She did have one episode of hypoglycemia (did not measure due to being out of house, but was symptomatic) this morning which she treated with juice and repeat CBG of 104 when she returned home. She states this is a rare occasion and that she did not get a chance to eat after taking her AM Humalog.   Plan: --Lantus 65 units qhs --Humalog 10 units TID wc --Januvia 50mg  daily --metformin 1000mg  BID --f/u in 1 month with glucometer --advised on proper administration of humalog and treatment of hypoglycemia; advised to call or rtc sooner if she keeps having hypoglycemic episodes.

## 2017-08-16 NOTE — Progress Notes (Signed)
CC: hyperglycemia  HPI:  Alicia [name] is a 61 y.o. with a PMH of T2DM, HTN, HLD, NICM presenting to clinic for hospital follow up for cholecystitis, UTI, and T2DM. She is also here to establish care at [facility].  Cholecystitis, Vomiting: Patient recently admitted to [facility] for acute cholecystitis and is s/p laparoscopic cholecystectomy. She denies abd pain, fevers, chills, pain or drainage at incision sites. She has not followed up yet with surgery.  Patient does endorse episodes of nonbillious, nonbloody vomiting first thing in the morning for the past two days and endorses feeling completely back to normal afterwards. She describes the emesis as phlegm, except for this morning when she saw a piece of bread she ate last night. She endorses GERD like symptoms and that she has eaten late at night the past two nights. She has no tried anything for this.  UTI, Bacteremia: Patient was also treated for UTI in the hospital after complaints of pneumaturia, concerning UA, and CT scan (obtained to evaluate her n/v), remarkable for abundant air in her bladder. Urine culture grew K pneumoniae resistant to Ampicillin. Blood cultures were also obtained and one out of two blood cultures were positive for K pneumoniae. She received 5 days of appropriate IV antibiotics in the hospital, and was discharged with an additional 5 days of PO Keflex 500mg  BID. Patient endorses completion of antibiotic course and denies fevers, chills, dysuria, hematuria, flank pain, or further pneumaturia.   T2DM: Patient discharged on bydureon 2mg  once weekly, Lantus 50 units qhs, humalog 10 units TID, januvia 50mg  daily, and metformin 1000mg  BID. A1c in the hospital was 11. She endorses compliance with this therapy. She did not bring her meter in today. She states that usually her AM CBGs are 100-120, and PM CBGs are ~200. She did have one episode of hypoglycemia (did not measure due to being out of house, but was symptomatic) this  morning which she treated with juice and repeat CBG of 104 when she returned home. She states this is a rare occasion and that she did not get a chance to eat after taking her AM Humalog.   HTN:  Patient on regimen of amlodipine 10mg  daily, carvedilol 12.5mg  BID, lisinopril 20mg  daily, and furosemide 40mg  daily. She did not get a chance to take her medications this morning so she is hypertensive, however she reports usually taking them and having good control.   Hyponatremia: During admission patient remained hyponatremic, was 122 on admission thought to be 2/2 dehydration from vomiting/infection, however on discharge it remained decreased to 129.   Patient also denies chest pain, shortness of breath, headaches, vision or hearing changes, LE edema.  PMH: NICM, HTN, HLD, T2DM, h/o CVA, uterine prolapse, cystocele, h/o SBO 2/2 adhesions  PSH: Ventral hernia repair, tubal ligation, laparotomy (for adhesions), cholecystectomy  Social: Denies alcohol, illicit drug past or present. She has a 60yr smoking history - quit earlier this month and is very proud of herself.  Allergies: NKDA  FHx: M died of massive CVA at age 66, she also had HTN and T2DM. F is deceased of unknown cause. Younger brother is deceased from heart failure. Older brother with heart failure.  Please see problem based Assessment and Plan for status of patients chronic conditions.  Past Medical History:  Diagnosis Date  . Cerebrovascular Accidents (CVA) 05/10/2013   6 strokes, most recent on 05/10/13  . 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 Diabetes Mellitus, Type 2 10/15/2013  .  Hyperlipidemia   . Hypertension   . 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  Marland Kitchen Small Bowel Obstruction (SBO) 01/13/2014   Ex-Lap & Lysis of Adhesion,  01/15/2014  . Uterine Prolapse, First Degree 02/12/2016    Review of Systems:   ROS Per HPI  Physical Exam:  Vitals:   08/16/17 0952  BP: (!) 162/71  Pulse: 88  Temp: 98 F (36.7 C)  TempSrc: Oral  SpO2: 100%  Weight: 178 lb 11.2 oz (81.1 kg)   GENERAL- alert, co-operative, appears as stated age, not in any distress. HEENT- Atraumatic, normocephalic, EOMI, oral mucosa appears moist CARDIAC- RRR, no murmurs, rubs or gallops. RESP- Moving equal volumes of air, and clear to auscultation bilaterally, no wheezes or crackles. ABDOMEN- Soft, nontender, bowel sounds present. Lap incisions nontender, nondraining, healing well. NEURO- No obvious Cr N abnormality. EXTREMITIES- pulse 2+, symmetric, no pedal edema. SKIN- Warm, dry, no rash or lesion. PSYCH- Normal mood and affect, appropriate thought content and speech.  Assessment & Plan:   See Encounters Tab for problem based charting.   Patient discussed with Dr. Abelardo Diesel, MD Internal Medicine PGY2

## 2017-08-16 NOTE — Assessment & Plan Note (Signed)
Patient was also treated for UTI in the hospital after complaints of pneumaturia, concerning UA, and CT scan (obtained to evaluate her n/v), remarkable for abundant air in her bladder. Urine culture grew K pneumoniae resistant to Ampicillin. Blood cultures were also obtained and one out of two blood cultures were positive for K pneumoniae. She received 5 days of appropriate IV antibiotics in the hospital, and was discharged with an additional 5 days of PO Keflex 500mg  BID. Patient endorses completion of antibiotic course and denies fevers, chills, dysuria, hematuria, flank pain, or further pneumaturia.  Plan: --no further antibiotics indicated; resolved

## 2017-08-16 NOTE — Assessment & Plan Note (Signed)
Follows with Dr. Marlou Porch. Last Echo with EF 25-30%, no ICD, from NICM.   Plan: --carvedilol, lisinopril

## 2017-08-16 NOTE — Assessment & Plan Note (Signed)
Patient with 25 pack year smoking history, who recently quit. She is very proud of herself for quitting. No wheezing or rhonchi appreciated on exam.  I encouraged her cessation.

## 2017-08-16 NOTE — Patient Instructions (Signed)
Please follow up with your surgeons.  We will do some lab tests today to evaluate your lower sodium count.  Please come back in about 1 month with your glucose meter so we can adjust your diabetes medicines more accurately based on your day-to-day sugars.  Please eat 3 regular meals a day if able, and always have a meal or a snack with your Humalog.   For your nausea and vomiting in the mornings, please eat your last meal of the day at 6 or earlier. Start taking omeprazole 20mg , about 30 minutes before breakfast.

## 2017-08-16 NOTE — Assessment & Plan Note (Addendum)
During admission patient remained hyponatremic, was 122 on admission thought to be 2/2 dehydration from vomiting/infection, however on discharge it remained decreased to 129. Review of previous blood work reveals consistent mild hyponatremia from 2014-2016.   She does not have volume overload, no evidence of cirrhosis on CT, no medications that are associated with hyponatremia.  Plan: --f/u bmet, serum and urine osm, and urine na   Addendum: At this time, Na corrects to normal (135) on account of her hyperglycemia. Would recommend checking Bmet and possibly urine studies again in a few weeks once glucose control is better as this has been a chronic problem not always associated with as severe of hyperglycemia.

## 2017-08-16 NOTE — Assessment & Plan Note (Signed)
S/p laparoscopic cholecystectomy 08/03/2017. Incisions healing well. Has a f/u appointment with surgery.

## 2017-08-16 NOTE — Assessment & Plan Note (Addendum)
Patient on regimen of amlodipine 10mg  daily, carvedilol 12.5mg  BID, lisinopril 20mg  daily, and furosemide 40mg  daily. She did not get a chance to take her medications this morning so she is hypertensive, however she reports usually taking them and having good control.   Plan: --continue current regimen for now --f/u in 1 month

## 2017-08-16 NOTE — Assessment & Plan Note (Signed)
Patient does endorse episodes of nonbillious, nonbloody vomiting first thing in the morning for the past two days and endorses feeling completely back to normal afterwards. She describes the emesis as phlegm, except for this morning when she saw a piece of bread she ate last night. She endorses GERD like symptoms and that she has eaten late at night the past two nights. She has no tried anything for this.  Plan: --start omeprazole 20mg  daily --f/u in 1 month

## 2017-08-17 LAB — OSMOLALITY, URINE: OSMOLALITY UR: 490 mosm/kg

## 2017-08-17 LAB — BMP8+ANION GAP
ANION GAP: 16 mmol/L (ref 10.0–18.0)
BUN/Creatinine Ratio: 20 (ref 9–23)
BUN: 10 mg/dL (ref 6–24)
CALCIUM: 9 mg/dL (ref 8.7–10.2)
CO2: 27 mmol/L (ref 20–29)
CREATININE: 0.51 mg/dL — AB (ref 0.57–1.00)
Chloride: 89 mmol/L — ABNORMAL LOW (ref 96–106)
GFR calc Af Amer: 122 mL/min/{1.73_m2} (ref >59)
GFR calc non Af Amer: 106 mL/min/{1.73_m2} (ref >59)
Glucose: 263 mg/dL — ABNORMAL HIGH (ref 65–99)
Potassium: 5.2 mmol/L (ref 3.5–5.2)
Sodium: 132 mmol/L — ABNORMAL LOW (ref 134–144)

## 2017-08-17 LAB — OSMOLALITY: OSMOLALITY MEAS: 281 mosm/kg (ref 275–295)

## 2017-08-17 LAB — SODIUM, URINE, RANDOM: SODIUM UR: 121 mmol/L

## 2017-08-18 MED ORDER — INSULIN LISPRO 100 UNIT/ML (KWIKPEN): 10.0000 [IU] | PEN_INJECTOR | Freq: Three times a day (TID) | SUBCUTANEOUS | 2 refills | Status: DC

## 2017-08-18 NOTE — Addendum Note (Signed)
Addended by: Alphonzo Grieve on: 08/18/2017 10:30 AM   Modules accepted: Orders

## 2017-08-23 NOTE — Progress Notes (Signed)
Internal Medicine Clinic Attending  Case discussed with Dr. Svalina  at the time of the visit.  We reviewed the resident's history and exam and pertinent patient test results.  I agree with the assessment, diagnosis, and plan of care documented in the resident's note.  

## 2017-08-25 ENCOUNTER — Telehealth: Payer: Self-pay | Admitting: *Deleted

## 2017-08-25 NOTE — Telephone Encounter (Signed)
Pt states she was told the Bradford would be changed to something else, she is talking about the needle being too big but really it is hard to understand what she is talking about. Please advise

## 2017-08-25 NOTE — Telephone Encounter (Signed)
At last visit, we talked about possibly getting smaller needle for her Bydureon pen, however unfortunately the needles that come with the injector are the only options. I would encourage her to continue using it.  Thank you,  Estill Dooms

## 2017-08-30 NOTE — Telephone Encounter (Signed)
Lm for rtc 

## 2017-09-03 NOTE — Telephone Encounter (Signed)
Lm for rtc 

## 2017-09-06 NOTE — Telephone Encounter (Signed)
Lm for rtc 

## 2017-09-08 NOTE — Telephone Encounter (Signed)
Have called multiple times, no response

## 2017-09-24 ENCOUNTER — Ambulatory Visit (INDEPENDENT_AMBULATORY_CARE_PROVIDER_SITE_OTHER): Payer: Medicare Other | Admitting: Internal Medicine

## 2017-09-24 ENCOUNTER — Encounter: Payer: Self-pay | Admitting: Internal Medicine

## 2017-09-24 VITALS — BP 137/69 | HR 65 | Temp 98.0°F | Wt 181.9 lb

## 2017-09-24 DIAGNOSIS — D3502 Benign neoplasm of left adrenal gland: Secondary | ICD-10-CM | POA: Insufficient documentation

## 2017-09-24 DIAGNOSIS — L988 Other specified disorders of the skin and subcutaneous tissue: Secondary | ICD-10-CM

## 2017-09-24 DIAGNOSIS — I5022 Chronic systolic (congestive) heart failure: Secondary | ICD-10-CM | POA: Diagnosis not present

## 2017-09-24 DIAGNOSIS — R21 Rash and other nonspecific skin eruption: Secondary | ICD-10-CM | POA: Diagnosis not present

## 2017-09-24 DIAGNOSIS — E7800 Pure hypercholesterolemia, unspecified: Secondary | ICD-10-CM

## 2017-09-24 DIAGNOSIS — I2510 Atherosclerotic heart disease of native coronary artery without angina pectoris: Secondary | ICD-10-CM | POA: Diagnosis not present

## 2017-09-24 DIAGNOSIS — I43 Cardiomyopathy in diseases classified elsewhere: Secondary | ICD-10-CM

## 2017-09-24 DIAGNOSIS — F17210 Nicotine dependence, cigarettes, uncomplicated: Secondary | ICD-10-CM | POA: Diagnosis not present

## 2017-09-24 DIAGNOSIS — I10 Essential (primary) hypertension: Secondary | ICD-10-CM

## 2017-09-24 DIAGNOSIS — N813 Complete uterovaginal prolapse: Secondary | ICD-10-CM

## 2017-09-24 DIAGNOSIS — I110 Hypertensive heart disease with heart failure: Secondary | ICD-10-CM | POA: Diagnosis not present

## 2017-09-24 DIAGNOSIS — Z23 Encounter for immunization: Secondary | ICD-10-CM

## 2017-09-24 DIAGNOSIS — I700 Atherosclerosis of aorta: Secondary | ICD-10-CM | POA: Diagnosis not present

## 2017-09-24 DIAGNOSIS — Z79899 Other long term (current) drug therapy: Secondary | ICD-10-CM

## 2017-09-24 DIAGNOSIS — K5730 Diverticulosis of large intestine without perforation or abscess without bleeding: Secondary | ICD-10-CM | POA: Insufficient documentation

## 2017-09-24 DIAGNOSIS — N814 Uterovaginal prolapse, unspecified: Secondary | ICD-10-CM

## 2017-09-24 DIAGNOSIS — K219 Gastro-esophageal reflux disease without esophagitis: Secondary | ICD-10-CM

## 2017-09-24 DIAGNOSIS — E731 Secondary lactase deficiency: Secondary | ICD-10-CM

## 2017-09-24 DIAGNOSIS — L409 Psoriasis, unspecified: Secondary | ICD-10-CM | POA: Insufficient documentation

## 2017-09-24 DIAGNOSIS — Z0000 Encounter for general adult medical examination without abnormal findings: Secondary | ICD-10-CM | POA: Insufficient documentation

## 2017-09-24 DIAGNOSIS — J301 Allergic rhinitis due to pollen: Secondary | ICD-10-CM

## 2017-09-24 DIAGNOSIS — E785 Hyperlipidemia, unspecified: Secondary | ICD-10-CM | POA: Diagnosis not present

## 2017-09-24 DIAGNOSIS — E113399 Type 2 diabetes mellitus with moderate nonproliferative diabetic retinopathy without macular edema, unspecified eye: Secondary | ICD-10-CM

## 2017-09-24 DIAGNOSIS — F17200 Nicotine dependence, unspecified, uncomplicated: Secondary | ICD-10-CM

## 2017-09-24 DIAGNOSIS — E113393 Type 2 diabetes mellitus with moderate nonproliferative diabetic retinopathy without macular edema, bilateral: Secondary | ICD-10-CM

## 2017-09-24 DIAGNOSIS — Z9049 Acquired absence of other specified parts of digestive tract: Secondary | ICD-10-CM

## 2017-09-24 DIAGNOSIS — E669 Obesity, unspecified: Secondary | ICD-10-CM | POA: Insufficient documentation

## 2017-09-24 DIAGNOSIS — Z794 Long term (current) use of insulin: Secondary | ICD-10-CM

## 2017-09-24 DIAGNOSIS — Z7982 Long term (current) use of aspirin: Secondary | ICD-10-CM

## 2017-09-24 DIAGNOSIS — E738 Other lactose intolerance: Secondary | ICD-10-CM | POA: Insufficient documentation

## 2017-09-24 DIAGNOSIS — E663 Overweight: Secondary | ICD-10-CM

## 2017-09-24 HISTORY — DX: Overweight: E66.3

## 2017-09-24 HISTORY — DX: Psoriasis, unspecified: L40.9

## 2017-09-24 HISTORY — DX: Diverticulosis of large intestine without perforation or abscess without bleeding: K57.30

## 2017-09-24 HISTORY — DX: Gastro-esophageal reflux disease without esophagitis: K21.9

## 2017-09-24 HISTORY — DX: Atherosclerosis of aorta: I70.0

## 2017-09-24 HISTORY — DX: Other lactose intolerance: E73.8

## 2017-09-24 HISTORY — DX: Benign neoplasm of left adrenal gland: D35.02

## 2017-09-24 HISTORY — DX: Allergic rhinitis due to pollen: J30.1

## 2017-09-24 HISTORY — DX: Secondary lactase deficiency: E73.1

## 2017-09-24 NOTE — Assessment & Plan Note (Signed)
Assessment  She is tolerating the atorvastatin 40 mg by mouth daily without myalgias.  Plan  We will continue this high intensity statin and reassess for evidence of intolerances at the follow-up visit.

## 2017-09-24 NOTE — Progress Notes (Signed)
   Subjective:    Patient ID: Alicia [name], female    DOB: 1956-05-31, 61 y.o.   MRN: [id number]  HPI  Alicia [name] is here for follow-up of her type 2 diabetes with moderate non-proliferative diabetic retinopathy, essential hypertension, hyperlipidemia, tobacco abuse, chronic systolic heart failure, coronary artery disease without angina, aortic atherosclerosis, and gastroesophageal reflux disease. Please see the A&P for the status of the pt's chronic medical problems.  Review of Systems  Constitutional: Negative for activity change, appetite change and unexpected weight change.  Respiratory: Negative for apnea, cough, chest tightness, shortness of breath and wheezing.   Cardiovascular: Negative for chest pain, palpitations and leg swelling.  Gastrointestinal: Negative for abdominal distention, abdominal pain, constipation, diarrhea, nausea and vomiting.  Musculoskeletal: Negative for arthralgias, back pain, gait problem, joint swelling, myalgias, neck pain and neck stiffness.  Skin: Positive for rash. Negative for wound.       Hyperpigmented scaly lesions on extensor surfaces.  Neurological: Negative for dizziness, seizures, facial asymmetry, speech difficulty and weakness.  Psychiatric/Behavioral: Negative for dysphoric mood and sleep disturbance. The patient is nervous/anxious.       Objective:   Physical Exam  Constitutional: She is oriented to person, place, and time. She appears well-developed and well-nourished. No distress.  HENT:  Head: Normocephalic and atraumatic.  Eyes: Conjunctivae are normal. Right eye exhibits no discharge. Left eye exhibits no discharge. No scleral icterus.  Cardiovascular: Normal rate, regular rhythm and normal heart sounds.  Exam reveals no gallop and no friction rub.   No murmur heard. Pulmonary/Chest: Effort normal and breath sounds normal. No respiratory distress. She has no wheezes. She has no rales.  Abdominal: Soft. Bowel sounds are normal.  She exhibits no distension. There is no tenderness. There is no rebound and no guarding.  Musculoskeletal: Normal range of motion. She exhibits no edema, tenderness or deformity.  Neurological: She is alert and oriented to person, place, and time. She exhibits normal muscle tone. Coordination normal.  Skin: Skin is warm and dry. No rash noted. She is not diaphoretic. No erythema.  Psychiatric: She has a normal mood and affect. Her behavior is normal. Judgment and thought content normal.  Nursing note and vitals reviewed.     Assessment & Plan:   Please see problem based charting.

## 2017-09-24 NOTE — Assessment & Plan Note (Signed)
Assessment  She has a grade 3 cystocele with uterine prolapse. Although in the past this has been reported to affect her urination she currently states it does not bother her. She has been fitted with a pessary in the past but did not find this a viable option for her and she is not interested in using the pessary at this time.  Plan  We will continue to follow expectantly.

## 2017-09-24 NOTE — Assessment & Plan Note (Signed)
Assessment  Her chronic systolic heart failure secondary to a hypertensive cardiomyopathy is symptomatically well controlled on her current regimen without any dyspnea, orthopnea, paroxysmal nocturnal dyspnea, or edema. Her current regimen includes Coreg 12.5 mg by mouth twice daily, lisinopril 20 mg by mouth daily, and Lasix 40 mg by mouth daily.  Plan  We will continue the Coreg 12.5 mg by mouth twice daily, lisinopril 20 by mouth daily, and Lasix 40 mg by mouth daily. We will reassess the efficacy of this therapy in maintaining her asymptomatic status of her chronic systolic heart failure at the follow-up visit.

## 2017-09-24 NOTE — Assessment & Plan Note (Signed)
Assessment  She continues to deny any angina.  Plan  We will continue the aspirin 81 mg by mouth daily, atorvastatin 40 mg by mouth daily, and work on the cardiovascular risk factors as noted elsewhere. We will reassess for evidence of angina at the follow-up visit.

## 2017-09-24 NOTE — Assessment & Plan Note (Signed)
Assessment  She is currently smoking approximately one half pack per day. We discussed the importance of tobacco cessation given her multiple comorbidities and underlying coronary artery disease with heart failure. She understands the risks and ultimately would like to quit smoking. Of note, several months ago she was able to quit cold [place]. Unfortunately, at this point she is in the pre-contemplative stage and not ready for another attempt at tobacco cessation.  Plan  We spent approximately 4 minutes discussing smoking and the importance of cessation. I asked that she call me were she to change her mind and want pharmacologic therapy to assist her in her attempts at smoking cessation. We will readdress the issue at the follow-up visit.

## 2017-09-24 NOTE — Assessment & Plan Note (Signed)
Assessment  She is currently asymptomatic from her aortic atherosclerosis without claudication.  Plan  We will continue aggressive risk factor modification. This includes trying to improve her glycemic control, maintaining her blood pressure control, continuing her high intensity statin, and encouraging tobacco cessation. We will reassess her success in modifying these risk factors at the follow-up visit.

## 2017-09-24 NOTE — Assessment & Plan Note (Signed)
She is not interested in colon cancer screening at this time but will reconsider at the follow-up visit. She was given the tetanus and diphtheria vaccination today. She is otherwise up-to-date on her general health care maintenance.

## 2017-09-24 NOTE — Patient Instructions (Signed)
It was very nice to meet you and your granddaughter today!  I look forward to caring for you for years to come.  1) Keep taking the medications as you are.  2) We gave you a tetanus booster today.  This will last 10 years.  3) Let me know if you want help quitting smoking.  I will see you back in three months.

## 2017-09-24 NOTE — Assessment & Plan Note (Signed)
Assessment  She notes an occasional brackish taste in the back of her mouth consistent with gastroesophageal reflux disease. She is not interested in pharmacologic therapy at this time.  Plan  We will reassess the symptomatology and her interest in pharmacologic therapy at the follow-up visit.

## 2017-09-24 NOTE — Assessment & Plan Note (Signed)
Assessment  Her blood pressure is well controlled today at 137/69. This is well within target. Her current regimen includes lisinopril 20 mg by mouth daily, Coreg 12.5 mg by mouth twice daily, and amlodipine 10 mg by mouth daily.  Plan  We will continue the lisinopril 20 mg by mouth daily, Coreg 12.5 mg by mouth twice daily, and amlodipine 10 mg by mouth daily. We will reassess the efficacy of this therapy with a repeat blood pressure at the follow-up visit.

## 2017-09-24 NOTE — Assessment & Plan Note (Signed)
Assessment  Her most recent hemoglobin A1c approximately 2 months ago was 11.0. This was during her episode of acute on chronic cholecystitis secondary to cholelithiasis requiring a laparoscopic cholecystectomy. She states that her sugars are improved and this morning's reading was 163 at home. She denies any hypoglycemic episodes recently. Her current regimen includes Lantus 50 units daily, NovoLog 10 units with meals, Bydureon 1 mL subcutaneous on Saturdays, metformin 1000 mg by mouth twice daily, and Januvia 100 mg by mouth daily. With regards to her moderate diabetic retinopathy she has not seen an ophthalmologist in just over 2 years. I recommended that she see her ophthalmologist, but she would like to defer at this time secondary to financial strain.  Plan  We will continue with her current diabetic regimen. At the follow-up visit we will reassess her glycemic control with a repeat hemoglobin A1c. I suspect this will be improved given the fact that it will be more than 3 months removed from her acute on chronic cholecystitis. A foot exam was performed today and she is otherwise up-to-date on her diabetic health care maintenance with the exception of the eye exam. We will again stress the importance of another retinal exam at the follow-up visit and make the referral if she can afford it.

## 2017-10-08 ENCOUNTER — Other Ambulatory Visit: Payer: Self-pay | Admitting: *Deleted

## 2017-10-08 MED ORDER — FUROSEMIDE 40 MG PO TABS: 40.0000 mg | ORAL_TABLET | Freq: Every day | ORAL | 1 refills | Status: DC

## 2017-10-19 ENCOUNTER — Encounter: Payer: Self-pay | Admitting: Cardiology

## 2017-10-19 ENCOUNTER — Ambulatory Visit (INDEPENDENT_AMBULATORY_CARE_PROVIDER_SITE_OTHER): Payer: Medicare Other | Admitting: Cardiology

## 2017-10-19 VITALS — BP 158/76 | HR 90 | Ht 67.0 in | Wt 187.8 lb

## 2017-10-19 DIAGNOSIS — E7800 Pure hypercholesterolemia, unspecified: Secondary | ICD-10-CM

## 2017-10-19 DIAGNOSIS — F17200 Nicotine dependence, unspecified, uncomplicated: Secondary | ICD-10-CM | POA: Diagnosis not present

## 2017-10-19 DIAGNOSIS — I10 Essential (primary) hypertension: Secondary | ICD-10-CM

## 2017-10-19 DIAGNOSIS — I2510 Atherosclerotic heart disease of native coronary artery without angina pectoris: Secondary | ICD-10-CM | POA: Diagnosis not present

## 2017-10-19 DIAGNOSIS — I5022 Chronic systolic (congestive) heart failure: Secondary | ICD-10-CM

## 2017-10-19 NOTE — Patient Instructions (Signed)

## 2017-10-19 NOTE — Progress Notes (Signed)
[facility]. [address] [place], [name]  [address] Phone: [phone] Fax:  [phone]  Date:  10/19/2017   ID:  Alicia [name], DOB 14-Dec-1955, MRN [id number]  PCP:  Oval Linsey, MD   History of Present Illness: Alicia [name] is a 61 y.o. female with a history of chronic systolic heart failure (NICM) ejection fraction ranging from 25-40%, nuclear stress test with no ischemia, diabetes, hypertension, prior stroke here for followup.   No syncope, no retention of fluid. No chest pain.  Coreg BID.   At prior visit noted breathing up stairs (daughter thought it was horror move character [name] previously).  Her grandson also has skin condition that she has, hereditary with dark patches.  No chest pain, no shortness of breath, no syncope. She enjoyed a trip to [place]. This was the first time she was on an airplane. She left the day after the [place] here in [place].  10/19/17-she had a hospitalization in September with acute cholecystectomy performed, Klebsiella, Enterobacter, white count.  Overall she has been doing quite well.  Has some soreness with her laparoscopy site.  Denies any chest pain, syncope, bleeding, orthopnea.  No issues with heart failure.  She enjoyed seeing Dr. Eppie Gibson.   Studies:   - LHC (02/19/14): Mid RCA 30-40, distal RCA 60, EF 25%.  - Echo (05/11/13): Moderate LVH, EF 30-40%.  - Nuclear (07/23/09 - in [place]): No ischemia.  - Holter (03/2012): NSR, occasional PVCs, no significant arrhythmia  Recent Labs:  02/18/2014: Pro B Natriuretic peptide (BNP) 2876.0*; TSH 1.032  02/20/2014: Hemoglobin 10.9*  03/27/2014: ALT 13; HDL Cholesterol by NMR 49.50; LDL (calc) 135*  04/02/2014: Creatinine 0.5; Potassium 3.8      Wt Readings from Last 3 Encounters:  10/19/17 187 lb 12.8 oz (85.2 kg)  09/24/17 181 lb 14.4 oz (82.5 kg)  08/16/17 178 lb 11.2 oz (81.1 kg)     Past Medical History:  Diagnosis Date  . Acquired lactose  intolerance 09/24/2017  . 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  Non-functioning  . Aortic atherosclerosis ([place]) 09/24/2017   Asymptomatic, found on CT scan  . Blood transfusion without reported diagnosis   . Chronic Systolic Heart Failure 5/32/9924   Felt to be non-ischemic and secondary to hypertension.  Echo (05/28/2014): LVEF 25%.  Is not interested in AICD placement.  . Coronary artery disease involving native coronary artery of native heart without angina pectoris 11/09/2014   Cardiac cath (02/18/2014): Non-obstructive, mRCA 30%, dRCA 60%  . 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 Diverticulosis of colon 09/24/2017  . Essential hypertension 10/15/2013  . Gastroesophageal reflux disease 09/24/2017  . History of cerebrovascular accident 05/10/2013   Per patient report 6 previous strokes, most recent on 05/10/13.  No residual deficits.  . Hyperlipidemia   . Overweight (BMI 25.0-29.9) 09/24/2017  . Psoriasis 09/24/2017  . Seasonal allergic rhinitis due to pollen 09/24/2017   Spring and early Fall  . Small Bowel Obstruction (SBO) 01/13/2014   Ex-Lap & Lysis of Adhesion, 01/15/2014  . Tobacco use disorder 01/15/2014  . Type 2 diabetes mellitus with moderate nonproliferative diabetic retinopathy ([place]) 10/15/2013    Past Surgical History:  Procedure Laterality Date  . CHOLECYSTECTOMY N/A 08/02/2017   Procedure: LAPAROSCOPIC CHOLECYSTECTOMY;  Surgeon: Erroll Luna, MD;  Location: [facility];  Service: General;  Laterality: N/A;  . LAPAROTOMY N/A 01/15/2014   Procedure: Exploratory Laparotomy & Small Bowel Resection  Surgeon: Imogene Burn. Georgette Dover, MD  Location: [facility]  . LEFT HEART CATHETERIZATION WITH CORONARY ANGIOGRAM N/A 02/19/2014   Procedure: LEFT HEART CATHETERIZATION WITH CORONARY ANGIOGRAM;  Surgeon: Troy Sine, MD;  Location: [facility] CATH LAB;  Service: Cardiovascular;  Laterality:  N/A;  . LYSIS OF ADHESION N/A 01/15/2014   Procedure: LYSIS OF ADHESION;  Surgeon: Imogene Burn. Georgette Dover, MD;  Location: [facility];  Service: General;  Laterality: N/A;  . SMALL INTESTINE SURGERY    . TUBAL LIGATION    . VENTRAL HERNIA REPAIR N/A 12/2013   Prior ventral hernia repair- strangulation. 2/15    Current Outpatient Medications  Medication Sig Dispense Refill  . amLODipine (NORVASC) 10 MG tablet Take 1 tablet (10 mg total) by mouth daily. 90 tablet 3  . aspirin 81 MG tablet Take 81 mg by mouth daily.    Marland Kitchen atorvastatin (LIPITOR) 40 MG tablet Take 1 tablet (40 mg total) by mouth daily. 90 tablet 3  . BYDUREON 2 MG SRER Inject 1 mL into the skin once a week. Saturdays     . carvedilol (COREG) 12.5 MG tablet TAKE 1 TABLET BY MOUTH 2 TIMES DAILY. 180 tablet 2  . furosemide (LASIX) 40 MG tablet Take 1 tablet (40 mg total) daily by mouth. 90 tablet 1  . glucose blood (ACCU-CHEK AVIVA PLUS) test strip USE TO TEST BLOOD SUGARS AS DIRECTED BY PHYSICIAN 100 each 12  . Insulin Glargine (LANTUS) 100 UNIT/ML Solostar Pen Inject 50 Units into the skin daily at 10 pm. 2 pen 3  . insulin lispro (HUMALOG KWIKPEN) 100 UNIT/ML KiwkPen Inject 0.1 mLs (10 Units total) into the skin 3 (three) times daily. 45 mL 2  . Insulin Pen Needle 32G X 4 MM MISC Use to inject insulin 4 times a day. The patient is insulin requiring, ICD 10 code 11.10. The patient injects 4 times per day. 100 each 3  . JANUVIA 100 MG tablet Take 100 mg by mouth daily.    Marland Kitchen lisinopril (PRINIVIL,ZESTRIL) 20 MG tablet Take 1 tablet (20 mg total) by mouth daily. 90 tablet 3  . metFORMIN (GLUCOPHAGE-XR) 500 MG 24 hr tablet Take 1,000 mg by mouth 2 (two) times daily.    . TRUEPLUS LANCETS 28G MISC USE TO TEST BLOOD SUGARS AS DIRECTED BY PHYSICIAN 100 each 12   No current facility-administered medications for this visit.     Allergies:    No Known Allergies  Social History:  The patient  reports that she has been smoking cigarettes.  She has a  25.00 pack-year smoking history. she has never used smokeless tobacco. She reports that she does not drink alcohol or use drugs.   Family History  Problem Relation Age of Onset  . Diabetes Mellitus II Mother   . Hypertension Mother   . Cerebrovascular Accident Mother 47       Massive, resulted in death  . Heart failure Brother   . Obesity Brother   . Pulmonary embolism Father 40       Resulted in sudden death  . Coronary artery disease Brother   . Obesity Brother   . Diabetes Mellitus II Brother   . Healthy Brother   . Healthy Brother   . Obesity Son   . Healthy Son   . Healthy Daughter     ROS:  Please see the history of present illness.   Denies any  chest pain, orthopnea, PND, syncope, fevers all other systems reviewed and negative.   PHYSICAL EXAM: VS:  BP (!) 158/76   Pulse 90   Ht 5\' 7"  (1.702 m)   Wt 187 lb 12.8 oz (85.2 kg)   LMP  (LMP Unknown)   SpO2 98%   BMI 29.41 kg/m  GEN: Well nourished, well developed, in no acute distress  HEENT: normal  Neck: no JVD, carotid bruits, or masses Cardiac: RRR; no murmurs, rubs, or gallops,no edema  Respiratory:  clear to auscultation bilaterally, normal work of breathing GI: soft, nontender, nondistended, + BS MS: no deformity or atrophy  Skin: warm and dry, no rash Neuro:  Alert and Oriented x 3, Strength and sensation are intact Psych: euthymic mood, full affect    EKG:   EKG 10/06/16 HR 100, -sinus rhythm, no pacing seen, right atrial enlargement, old septal infarct, nonspecific T-wave changes. Prior 09/30/15-sinus rhythm, 62, poor R-wave progression, T-wave inversion lateral leads, left anterior fascicular block. Personally viewed     ECHO 05/28/14: - Left ventricle: The cavity size was normal. There was moderate concentric hypertrophy. Systolic function was severely reduced. The estimated ejection fraction was in the range of 20% to 25%. There is hypokinesis of the inferior myocardium. - Mitral valve: There was  mild regurgitation. - Left atrium: The atrium was mildly dilated.  ASSESSMENT AND PLAN:    1. Chronic systolic heart failure-nonischemic. NYHA 1. Her blood pressure is well controlled usually. She is taking her medications. Reviewed as above. Beta blocker, ACE inhibitor. Overall doing quite well. Was able to walk around [facility] without much difficulty. Previously had discussion, no ICD.  No changes made today medications. 2. Hypertension-elevated today. Continue to encourage medication compliance.  Overall has been doing well. 3. Hyperlipidemia-atorvastatin 40. LDL 90 less than 100. Overall doing quite well.  No changes made. 4. Coronary artery disease-nonobstructive disease. Continue with secondary prevention. Statin. No myalgias.  Nonischemic cardia myopathy. 5. Prior ventral hernia repair- strangulation. 2/15, stable.  6. Prior cholecystectomy-had white count of 18,000, Enterobacter, Klebsiella, antibiotics, cholecystectomy in September 2018. 7. Diabetes-hemoglobin A1c 13 at one point, now much better-working with Dr. Eppie Gibson, Lantus. 8. Tobacco use - cessation. Discussed again. Quit one month prior, now back to smoking.   9. 6 month followup    Signed, Candee Furbish, MD [facility]  10/19/2017 11:07 AM

## 2017-10-25 ENCOUNTER — Other Ambulatory Visit: Payer: Self-pay | Admitting: Cardiology

## 2017-10-25 DIAGNOSIS — I10 Essential (primary) hypertension: Secondary | ICD-10-CM

## 2017-11-20 ENCOUNTER — Other Ambulatory Visit: Payer: Self-pay | Admitting: Internal Medicine

## 2017-11-24 ENCOUNTER — Encounter: Payer: Self-pay | Admitting: Internal Medicine

## 2017-11-24 ENCOUNTER — Ambulatory Visit (INDEPENDENT_AMBULATORY_CARE_PROVIDER_SITE_OTHER): Payer: Medicare Other | Admitting: Internal Medicine

## 2017-11-24 VITALS — BP 146/75 | HR 76 | Temp 99.9°F | Wt 189.0 lb

## 2017-11-24 DIAGNOSIS — L02415 Cutaneous abscess of right lower limb: Secondary | ICD-10-CM

## 2017-11-24 MED ORDER — DOXYCYCLINE HYCLATE 100 MG PO CAPS: 100.0000 mg | ORAL_CAPSULE | Freq: Two times a day (BID) | ORAL | 0 refills | Status: DC

## 2017-11-24 NOTE — Progress Notes (Signed)
   CC: right leg abscess   HPI:  [name] is a 62 y.o. female with history noted below that presents to the acute care clinic for abscess of right upper thigh. She states that the abscess started on Sunday accompanied with nausea, vomiting, subjective fever/chills. She states she has not been able to have oral intake for the past 3 days.  She states the abscess in her leg happened spontaneously and has grown in size over the past few days.    Past Medical History:  Diagnosis Date  . Acquired lactose intolerance 09/24/2017  . 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  Non-functioning  . Aortic atherosclerosis ([place]) 09/24/2017   Asymptomatic, found on CT scan  . Blood transfusion without reported diagnosis   . Chronic Systolic Heart Failure 12/11/4172   Felt to be non-ischemic and secondary to hypertension.  Echo (05/28/2014): LVEF 25%.  Is not interested in AICD placement.  . Coronary artery disease involving native coronary artery of native heart without angina pectoris 11/09/2014   Cardiac cath (02/18/2014): Non-obstructive, mRCA 30%, dRCA 60%  . 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 Diverticulosis of colon 09/24/2017  . Essential hypertension 10/15/2013  . Gastroesophageal reflux disease 09/24/2017  . History of cerebrovascular accident 05/10/2013   Per patient report 6 previous strokes, most recent on 05/10/13.  No residual deficits.  . Hyperlipidemia   . Overweight (BMI 25.0-29.9) 09/24/2017  . Psoriasis 09/24/2017  . Seasonal allergic rhinitis due to pollen 09/24/2017   Spring and early Fall  . Small Bowel Obstruction (SBO) 01/13/2014   Ex-Lap & Lysis of Adhesion, 01/15/2014  . Tobacco use disorder 01/15/2014  . Type 2 diabetes mellitus with moderate nonproliferative diabetic retinopathy ([place]) 10/15/2013    Review of Systems:  Review of Systems  Constitutional: Positive  for chills, fever and malaise/fatigue.  Respiratory: Negative for shortness of breath.   Cardiovascular: Negative for chest pain.  Gastrointestinal: Negative for abdominal pain.  Musculoskeletal: Negative for falls.     Physical Exam:  Vitals:   11/24/17 1055  BP: (!) 146/75  Pulse: 76  Temp: 99.9 F (37.7 C)  TempSrc: Oral  SpO2: 97%  Weight: 189 lb (85.7 kg)   Physical Exam  Constitutional: She is well-developed, well-nourished, and in no distress.  Cardiovascular: Normal rate, regular rhythm and normal heart sounds. Exam reveals no gallop and no friction rub.  No murmur heard. Pulmonary/Chest: Effort normal and breath sounds normal. No respiratory distress. She has no wheezes. She has no rales.  Skin:  Right upper thigh with erythema, warm to the touch and indurated 5cm x 5cm    Assessment & Plan:   See encounters tab for problem based medical decision making.   Patient seen with Dr. Dareen Piano

## 2017-11-24 NOTE — Patient Instructions (Signed)
Alicia [name],  You can use ibuprofen for pain control. Please start taking doxycycline 2 tablets a day. Please follow-up in the clinic on [place] 1/4. Please call the clinic if you develop worsening symptoms.

## 2017-11-26 ENCOUNTER — Encounter: Payer: Self-pay | Admitting: Internal Medicine

## 2017-11-26 ENCOUNTER — Ambulatory Visit: Payer: Medicare Other | Admitting: Internal Medicine

## 2017-11-26 VITALS — BP 86/51 | HR 57 | Temp 98.2°F | Ht 67.0 in | Wt 187.1 lb

## 2017-11-26 DIAGNOSIS — I10 Essential (primary) hypertension: Secondary | ICD-10-CM

## 2017-11-26 DIAGNOSIS — L02415 Cutaneous abscess of right lower limb: Secondary | ICD-10-CM | POA: Insufficient documentation

## 2017-11-26 MED ORDER — DOXYCYCLINE HYCLATE 100 MG PO CAPS: 100.0000 mg | ORAL_CAPSULE | Freq: Two times a day (BID) | ORAL | 0 refills | Status: DC

## 2017-11-26 MED ORDER — INSULIN LISPRO 100 UNIT/ML (KWIKPEN): 10.0000 [IU] | PEN_INJECTOR | Freq: Three times a day (TID) | SUBCUTANEOUS | 11 refills | Status: DC

## 2017-11-26 NOTE — Patient Instructions (Addendum)
Ms. Chaput,  It was a pleasure seeing you today. Please continue taking your doxycycline 2 times a day. You will do this for a total of 10 days from the day you started antibiotics. Please return to the clinic on Monday or Tuesday to take the packing out. Please hold amlodipine and lisinopril until you start eating well.  Please call the clinic if you develop symptoms of fever, vomiting or your symptoms are getting worse.

## 2017-11-26 NOTE — Progress Notes (Signed)
   CC: Follow-up on right upper thigh leg abscess  HPI:  Alicia [name] is a 62 y.o. female with history noted below that presented to the acute care clinic on 1/2 with right upper thigh leg abscess. At that time an I&D was performed and packing was placed. Today she presents for follow-up on abscess and to remove packing.    Past Medical History:  Diagnosis Date  . Acquired lactose intolerance 09/24/2017  . 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  Non-functioning  . Aortic atherosclerosis ([place]) 09/24/2017   Asymptomatic, found on CT scan  . Blood transfusion without reported diagnosis   . Chronic Systolic Heart Failure 0/62/6948   Felt to be non-ischemic and secondary to hypertension.  Echo (05/28/2014): LVEF 25%.  Is not interested in AICD placement.  . Coronary artery disease involving native coronary artery of native heart without angina pectoris 11/09/2014   Cardiac cath (02/18/2014): Non-obstructive, mRCA 30%, dRCA 60%  . 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 Diverticulosis of colon 09/24/2017  . Essential hypertension 10/15/2013  . Gastroesophageal reflux disease 09/24/2017  . History of cerebrovascular accident 05/10/2013   Per patient report 6 previous strokes, most recent on 05/10/13.  No residual deficits.  . Hyperlipidemia   . Overweight (BMI 25.0-29.9) 09/24/2017  . Psoriasis 09/24/2017  . Seasonal allergic rhinitis due to pollen 09/24/2017   Spring and early Fall  . Small Bowel Obstruction (SBO) 01/13/2014   Ex-Lap & Lysis of Adhesion, 01/15/2014  . Tobacco use disorder 01/15/2014  . Type 2 diabetes mellitus with moderate nonproliferative diabetic retinopathy ([place]) 10/15/2013    Review of Systems:  Review of Systems  Constitutional: Negative for chills, fever and malaise/fatigue.  Respiratory: Negative for shortness of breath.   Cardiovascular: Negative for chest  pain.     Physical Exam:  Vitals:   11/26/17 1001 11/26/17 1011  BP: (!) 93/55 (!) 86/51  Pulse: 60 (!) 57  Temp: 98.2 F (36.8 C)   TempSrc: Oral   SpO2: 98%   Weight: 187 lb 1.6 oz (84.9 kg)   Height: 5\' 7"  (1.702 m)    Physical Exam  Constitutional: She is well-developed, well-nourished, and in no distress.  Skin: Skin is warm.  Right upper thigh with purulent drainage from previous I&D site, no erythema noted, no induration noted     Assessment & Plan:   See encounters tab for problem based medical decision making.      Patient seen with Dr. Daryll Drown

## 2017-11-26 NOTE — Assessment & Plan Note (Signed)
Assessment:  Hypotension Patient states she has not been eating well for the past 3-4 days due to the leg abscess. She has continued to take her home blood pressure medications that included carvedilol, lisinopril and amlodipine. Today her blood pressure was low at 93/55. Told patient to hold lisinopril and amlodipine until she starts eating well. Will reassess blood pressure on return visit next week.  Plan -hold amlodipine and lisinopril -Continue carvedilol

## 2017-11-26 NOTE — Assessment & Plan Note (Signed)
Assessment:  Right leg abscess Patient presented two days ago with abscess on right upper thigh.  The area was I&D and packing placed.  Today the area looks improved with minimal erythema and no induration noted. Area continues to drain purulent material. Packing was taken out by patient the day prior. Today packing was placed and patient was instructed to keep packing in until next visit next week to help with healing. Went ahead and extended course of antibiotics to a total of 10 days of doxycycline.  Plan -Repacking of abscess -Follow-up next week to take packing out -Complete a 10 day course of doxycycline

## 2017-11-29 NOTE — Assessment & Plan Note (Addendum)
Assessment: Right upper thigh abscess Patient has elevated temp at 99.9, abscess that was noted to be 5 x 5 cm and 2 cm deep on ultrasound that did not extend into muscular area. An I&D with packing was done in office and doxycycline was prescribed.  Return precautions were given. The patient has follow-up on 1/4.  Incision and Drainage Procedure Note  Pre-operative Diagnosis: right upper thigh abscess  Post-operative Diagnosis: right upper thigh abscess  Indications: Needs drainage for treatment  Anesthesia: lidocaine  Procedure Details  The procedure, risks and complications have been discussed in detail (including, but not limited to airway compromise, infection, bleeding) with the patient, and the patient has signed consent to the procedure.  The skin was sterilely prepped  over the affected area in the usual fashion. After adequate local anesthesia, I&D with an #11 blade was performed on the right upper thigh. Purulent drainage was present The patient was observed until stable. The wound was packed with gauze. Sterile dressings were applied.  Findings: Purulent drainage from right upper thigh abscess   Condition: Tolerated procedure well   Complications: none    Plan -In office I&D -Doxycycline -Follow-up appointment in 2 days on 1/4 to take out packing

## 2017-11-29 NOTE — Progress Notes (Signed)
Internal Medicine Clinic Attending  I saw and evaluated the patient.  I personally confirmed the key portions of the history and exam documented by Dr. Hoffman and I reviewed pertinent patient test results.  The assessment, diagnosis, and plan were formulated together and I agree with the documentation in the resident's note.      

## 2017-11-30 ENCOUNTER — Ambulatory Visit (INDEPENDENT_AMBULATORY_CARE_PROVIDER_SITE_OTHER): Payer: Medicare Other | Admitting: Internal Medicine

## 2017-11-30 ENCOUNTER — Other Ambulatory Visit: Payer: Self-pay

## 2017-11-30 ENCOUNTER — Encounter: Payer: Self-pay | Admitting: Internal Medicine

## 2017-11-30 DIAGNOSIS — L02415 Cutaneous abscess of right lower limb: Secondary | ICD-10-CM

## 2017-11-30 NOTE — Progress Notes (Signed)
Internal Medicine Clinic Attending  I saw and evaluated the patient.  I personally confirmed the key portions of the history and exam documented by Dr. Hoffman and I reviewed pertinent patient test results.  The assessment, diagnosis, and plan were formulated together and I agree with the documentation in the resident's note.      

## 2017-11-30 NOTE — Patient Instructions (Signed)
[name],  Is a pleasure seeing you today. Please follow up on [place] to continue your wound care.

## 2017-11-30 NOTE — Progress Notes (Signed)
   CC: Follow-up on right upper thigh abscess  HPI:  [name] is a 62 y.o. female with history noted below the presents to the acute care clinic for follow-up on right upper thigh abscess. Patient presents today for the removal of packing placed on 1/4. She states that she is continuing to take her antibiotics. She denies fever/chills or nausea/vomiting. She states that the site is improving in terms of pain. She states that she is starting to regain her appetite.  Past Medical History:  Diagnosis Date  . Acquired lactose intolerance 09/24/2017  . 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  Non-functioning  . Aortic atherosclerosis ([place]) 09/24/2017   Asymptomatic, found on CT scan  . Blood transfusion without reported diagnosis   . Chronic Systolic Heart Failure 2/97/9892   Felt to be non-ischemic and secondary to hypertension.  Echo (05/28/2014): LVEF 25%.  Is not interested in AICD placement.  . Coronary artery disease involving native coronary artery of native heart without angina pectoris 11/09/2014   Cardiac cath (02/18/2014): Non-obstructive, mRCA 30%, dRCA 60%  . 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 Diverticulosis of colon 09/24/2017  . Essential hypertension 10/15/2013  . Gastroesophageal reflux disease 09/24/2017  . History of cerebrovascular accident 05/10/2013   Per patient report 6 previous strokes, most recent on 05/10/13.  No residual deficits.  . Hyperlipidemia   . Overweight (BMI 25.0-29.9) 09/24/2017  . Psoriasis 09/24/2017  . Seasonal allergic rhinitis due to pollen 09/24/2017   Spring and early Fall  . Small Bowel Obstruction (SBO) 01/13/2014   Ex-Lap & Lysis of Adhesion, 01/15/2014  . Tobacco use disorder 01/15/2014  . Type 2 diabetes mellitus with moderate nonproliferative diabetic retinopathy ([place]) 10/15/2013    Review of Systems:  As noted per history of  present illness  Physical Exam:  Vitals:   11/30/17 0944  BP: (!) 117/48  Pulse: 66  Temp: 98 F (36.7 C)  SpO2: 100%  Weight: 184 lb 11.2 oz (83.8 kg)  Height: 5\' 7"  (1.702 m)   Physical Exam  Constitutional: She is well-developed, well-nourished, and in no distress.  Skin:  Right upper thigh abscess draining purulent fluid.  Granulation tissue visualized, appears healing.  No erythema or heat noted on palpation.        Assessment & Plan:   See encounters tab for problem based medical decision making.     Patient seen with Dr. Daryll Drown

## 2017-11-30 NOTE — Progress Notes (Signed)
Internal Medicine Clinic Attending  I saw and evaluated the patient.  I personally confirmed the key portions of the history and exam documented by Dr. Heber [place] and I reviewed pertinent patient test results.  The assessment, diagnosis, and plan were formulated together and I agree with the documentation in the resident's note.  I supervised Dr. Heber Yoder during incision and drainage of the right upper thigh abscess.  Patient consent was obtained after explaining the risks and benefits of the procedure and the area was sterilized followed by incision and drainage of the abscess after numbing the area with local anesthesia.  Patient had approximately 5- 7 cc of purulent drainage from the site.  The wound was packed and bandaged and patient tolerated the procedure well.

## 2017-11-30 NOTE — Assessment & Plan Note (Signed)
Assessment: Right leg abscess Patient presented on 1/2 for newly developed right leg abscess. An I&D was performed and packing placed. She has been following up in the clinic for wound care, last seen on 1/4.  At that time site appeared healing. She states she is continuing her antibiotics and will complete a 10 day course. Today packing was taken out and purulent drainage noted. There is granulation tissue inside and site appeares to continue healing. Packing was replaced and patient was encouraged to follow up on 1/11.  Will not likely need further packing at that time.  Will clean and continue with bandage changes at home.  Plan -Wound care with repacking of abscess site today - follow up 1/11 - continue antibiotics to complete 10 days (end date Jan 12th)

## 2017-12-03 ENCOUNTER — Ambulatory Visit (INDEPENDENT_AMBULATORY_CARE_PROVIDER_SITE_OTHER): Payer: Medicare Other | Admitting: Internal Medicine

## 2017-12-03 ENCOUNTER — Other Ambulatory Visit: Payer: Self-pay

## 2017-12-03 DIAGNOSIS — L02415 Cutaneous abscess of right lower limb: Secondary | ICD-10-CM

## 2017-12-03 MED ORDER — NYSTATIN 100000 UNIT/ML MT SUSP: 5.0000 mL | Freq: Four times a day (QID) | OROMUCOSAL | 0 refills | Status: DC

## 2017-12-03 NOTE — Progress Notes (Signed)
   CC: Follow up on wound care for right leg abscess   HPI:  Alicia [name] is a 62 y.o. female with history noted below that presents to the acute care clinic for follow up on right leg abscess.  Please see problem based charting for the status of patient's chronic medical condition.  Past Medical History:  Diagnosis Date  . Acquired lactose intolerance 09/24/2017  . 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  Non-functioning  . Aortic atherosclerosis ([place]) 09/24/2017   Asymptomatic, found on CT scan  . Blood transfusion without reported diagnosis   . Chronic Systolic Heart Failure 3/71/6967   Felt to be non-ischemic and secondary to hypertension.  Echo (05/28/2014): LVEF 25%.  Is not interested in AICD placement.  . Coronary artery disease involving native coronary artery of native heart without angina pectoris 11/09/2014   Cardiac cath (02/18/2014): Non-obstructive, mRCA 30%, dRCA 60%  . 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 Diverticulosis of colon 09/24/2017  . Essential hypertension 10/15/2013  . Gastroesophageal reflux disease 09/24/2017  . History of cerebrovascular accident 05/10/2013   Per patient report 6 previous strokes, most recent on 05/10/13.  No residual deficits.  . Hyperlipidemia   . Overweight (BMI 25.0-29.9) 09/24/2017  . Psoriasis 09/24/2017  . Seasonal allergic rhinitis due to pollen 09/24/2017   Spring and early Fall  . Small Bowel Obstruction (SBO) 01/13/2014   Ex-Lap & Lysis of Adhesion, 01/15/2014  . Tobacco use disorder 01/15/2014  . Type 2 diabetes mellitus with moderate nonproliferative diabetic retinopathy ([place]) 10/15/2013    Review of Systems:  Review of Systems  Constitutional: Negative for chills, fever and malaise/fatigue.  Cardiovascular: Negative for leg swelling.  Gastrointestinal: Negative for nausea and vomiting.     Physical  Exam:  Vitals:   12/03/17 0929  BP: (!) 143/68  Pulse: 92  Temp: 97.8 F (36.6 C)  TempSrc: Oral  SpO2: 100%  Weight: 182 lb 8 oz (82.8 kg)  Height: 5\' 7"  (1.702 m)   Physical Exam  Constitutional: She is well-developed, well-nourished, and in no distress.  Skin: Skin is warm and dry.  Right upper thigh wound with minmal purulent drainage, well healing.  No erythema     Assessment & Plan:   See encounters tab for problem based medical decision making.   Patient seen with Dr. Evette Doffing

## 2017-12-03 NOTE — Patient Instructions (Signed)
Alicia [name],  It was a pleasure seeing you today.  Please re-pack your wound every 3 days.  Please follow up on Thursday the 17th

## 2017-12-05 NOTE — Assessment & Plan Note (Signed)
Assessment:  Right leg abscess Area appears well healing today.  Open lesion is taking time to approximate and will need continued wound care. Today area was cleaned and repacked.  Patient declined home health wound care and will continue to follow in the [facility] for appropriate wound care.   Plan -follow up in one week  - told patient to repack area every 3 days, supplies were given -advised patient complete antibiotic course -return precautions given

## 2017-12-06 NOTE — Progress Notes (Signed)
Internal Medicine Clinic Attending  I saw and evaluated the patient.  I personally confirmed the key portions of the history and exam documented by Dr. Heber [place] and I reviewed pertinent patient test results.  The assessment, diagnosis, and plan were formulated together and I agree with the documentation in the resident's note.  Right thigh abscess is well treated after I+D, and she is finishing antibiotic in two days, which is fine. I don't think she needs more antibiotics right now. I am worried that this wound will take prolonged healing. The edges are not re-approximating well, there is still some soft tissue swelling that is pulling the wound apart. We talked with our wound nurse, and decided to continuing packing it, and wait for it to heal by secondary intention. We will continue to follow this up weekly given its high risk for re-infection.

## 2017-12-06 NOTE — Addendum Note (Signed)
Addended by: Lalla Brothers T on: 12/06/2017 11:50 AM   Modules accepted: Level of Service

## 2017-12-09 ENCOUNTER — Ambulatory Visit (INDEPENDENT_AMBULATORY_CARE_PROVIDER_SITE_OTHER): Payer: Medicare Other | Admitting: Internal Medicine

## 2017-12-09 DIAGNOSIS — L02415 Cutaneous abscess of right lower limb: Secondary | ICD-10-CM

## 2017-12-09 MED ORDER — SULFAMETHOXAZOLE-TRIMETHOPRIM 800-160 MG PO TABS: 1.0000 | ORAL_TABLET | Freq: Two times a day (BID) | ORAL | 0 refills | Status: DC

## 2017-12-09 NOTE — Assessment & Plan Note (Signed)
Assessment:  Rigth leg abscess Area appears well healing today.  Open lesion is taking time to approximate, however improved from previous clinic visit.  She will need continued wound care. Today area was cleaned and repacked.  Area is still warm with some mild pustular drainage.  Will do one more week of antibiotics and change to Bactrim. Will have patient return in 1 week to continue repacking and assess healing.  Plan -follow up in one week  - told patient to repack area every 3 days - one week of bactrim

## 2017-12-09 NOTE — Patient Instructions (Signed)
Alicia [name],  It was a pleasure seeing you today.  Please start and complete one more week of antibiotics.  Follow up in one week.

## 2017-12-09 NOTE — Progress Notes (Signed)
   CC: Follow up on wound care  HPI:  [name] is a 62 y.o. female with history noted below that presents to the acute care clinic for follow up on wound care for abscess s/p I&D on 1/2 .  Please see problem based charting for the status of patient's chronic medical condition.  Past Medical History:  Diagnosis Date  . Acquired lactose intolerance 09/24/2017  . 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  Non-functioning  . Aortic atherosclerosis ([place]) 09/24/2017   Asymptomatic, found on CT scan  . Blood transfusion without reported diagnosis   . Chronic Systolic Heart Failure 05/08/736   Felt to be non-ischemic and secondary to hypertension.  Echo (05/28/2014): LVEF 25%.  Is not interested in AICD placement.  . Coronary artery disease involving native coronary artery of native heart without angina pectoris 11/09/2014   Cardiac cath (02/18/2014): Non-obstructive, mRCA 30%, dRCA 60%  . 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 Diverticulosis of colon 09/24/2017  . Essential hypertension 10/15/2013  . Gastroesophageal reflux disease 09/24/2017  . History of cerebrovascular accident 05/10/2013   Per patient report 6 previous strokes, most recent on 05/10/13.  No residual deficits.  . Hyperlipidemia   . Overweight (BMI 25.0-29.9) 09/24/2017  . Psoriasis 09/24/2017  . Seasonal allergic rhinitis due to pollen 09/24/2017   Spring and early Fall  . Small Bowel Obstruction (SBO) 01/13/2014   Ex-Lap & Lysis of Adhesion, 01/15/2014  . Tobacco use disorder 01/15/2014  . Type 2 diabetes mellitus with moderate nonproliferative diabetic retinopathy ([place]) 10/15/2013    Review of Systems:  Review of Systems  Constitutional: Negative for chills, fever and malaise/fatigue.  Gastrointestinal: Negative for nausea and vomiting.  Musculoskeletal: Negative for myalgias.     Physical Exam:  Vitals:     12/09/17 0949  BP: (!) 125/57  Pulse: 69  Temp: 98.2 F (36.8 C)  TempSrc: Oral  SpO2: 98%  Weight: 183 lb 12.8 oz (83.4 kg)  Height: 5\' 7"  (1.702 m)   Physical Exam  Constitutional: She is well-developed, well-nourished, and in no distress.  Skin:  Wound on right mid anterior thigh is well healing, warm to the touch with mild pustular drainage     Assessment & Plan:   See encounters tab for problem based medical decision making.   Patient seen with Dr. Eppie Gibson

## 2017-12-10 NOTE — Progress Notes (Signed)
I saw and evaluated the patient.  I personally confirmed the key portions of Dr. Young Berry history and exam and reviewed pertinent patient test results.  The assessment, diagnosis, and plan were formulated together and I agree with the documentation in the resident's note.  I was able to express a small amount of purulence.  The peri-incisional area was also slightly indurated.  Thus, we decided to treat with another 1 week of antibiotics, changing to Bactrim.  The would care will continue and we will reassess in 1 week.

## 2017-12-15 ENCOUNTER — Ambulatory Visit (INDEPENDENT_AMBULATORY_CARE_PROVIDER_SITE_OTHER): Payer: Medicare Other | Admitting: Internal Medicine

## 2017-12-15 ENCOUNTER — Encounter: Payer: Self-pay | Admitting: Internal Medicine

## 2017-12-15 DIAGNOSIS — L02415 Cutaneous abscess of right lower limb: Secondary | ICD-10-CM

## 2017-12-15 NOTE — Patient Instructions (Signed)
Alicia [name],  It was a pleasure seeing you today.  Please follow up in one week on 1/30.  Please call the clinic if there are any changes in your wound.

## 2017-12-15 NOTE — Progress Notes (Signed)
   CC: follow up on wound care  HPI:  [name] is a 62 y.o.  female with history noted below that presents to the acute care clinic for follow up on wound care for abscess s/p I&D on 1/2 .  Please see problem based charting for the status of patient's chronic medical condition.  Past Medical History:  Diagnosis Date  . Acquired lactose intolerance 09/24/2017  . 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  Non-functioning  . Aortic atherosclerosis ([place]) 09/24/2017   Asymptomatic, found on CT scan  . Blood transfusion without reported diagnosis   . Chronic Systolic Heart Failure 2/80/0349   Felt to be non-ischemic and secondary to hypertension.  Echo (05/28/2014): LVEF 25%.  Is not interested in AICD placement.  . Coronary artery disease involving native coronary artery of native heart without angina pectoris 11/09/2014   Cardiac cath (02/18/2014): Non-obstructive, mRCA 30%, dRCA 60%  . 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 Diverticulosis of colon 09/24/2017  . Essential hypertension 10/15/2013  . Gastroesophageal reflux disease 09/24/2017  . History of cerebrovascular accident 05/10/2013   Per patient report 6 previous strokes, most recent on 05/10/13.  No residual deficits.  . Hyperlipidemia   . Overweight (BMI 25.0-29.9) 09/24/2017  . Psoriasis 09/24/2017  . Seasonal allergic rhinitis due to pollen 09/24/2017   Spring and early Fall  . Small Bowel Obstruction (SBO) 01/13/2014   Ex-Lap & Lysis of Adhesion, 01/15/2014  . Tobacco use disorder 01/15/2014  . Type 2 diabetes mellitus with moderate nonproliferative diabetic retinopathy ([place]) 10/15/2013    Review of Systems:  Review of Systems  Constitutional: Negative for chills and fever.  Gastrointestinal: Negative for nausea and vomiting.  Skin: Negative for rash.     Physical Exam:  Vitals:   12/15/17 0946  BP: (!)  149/72  Pulse: 89  Temp: 97.8 F (36.6 C)  TempSrc: Oral  SpO2: 98%  Weight: 189 lb 8 oz (86 kg)  Height: 5\' 7"  (1.702 m)   Physical Exam  Constitutional: She is well-developed, well-nourished, and in no distress.  Skin:  peri-incisional area close to approximation with visible pink granulation tissue Area still indurated on the medial aspect of the mid upper right thigh.  Mild pustular drainage     Assessment & Plan:   See encounters tab for problem based medical decision making.   Patient seen with Dr. Angelia Mould

## 2017-12-15 NOTE — Assessment & Plan Note (Addendum)
Assessment:  Rigth leg abscess Patient has been followed in the [facility] for wound care of her abscess s/p I&D on 1/2.  Patient's wound has been healing nicely.  On previous clinic visit 1/17 there was still pustular drainage with induration and heat and an additional week of antibiotics was given.  Patient is on her last day of bactrim. Today area appears well healing and skin is almost approximated.  There is no more need for packing.  The area is no longer hot to the touch, however the medial aspect of the wound is still endurated.  I think this is healing granulation tissue however there is still some pustular drainage when pressed on the medial side of the wound.  I don't feel any pockets of potential fluid.  Area was cleaned and bandaged.  Will have patient return in 1 week to assure continued wound healing and no development of fluid pocket.  Plan -follow up in one week  - wound care instructions given to patient - return precautions given - finish one more day of bactrim

## 2017-12-17 NOTE — Progress Notes (Signed)
Internal Medicine Clinic Attending  I saw and evaluated the patient.  I personally confirmed the key portions of the history and exam documented by Dr. Hulet Ehrmann and I reviewed pertinent patient test results.  The assessment, diagnosis, and plan were formulated together and I agree with the documentation in the resident's note.      

## 2017-12-22 ENCOUNTER — Encounter: Payer: Self-pay | Admitting: Internal Medicine

## 2017-12-22 ENCOUNTER — Ambulatory Visit (INDEPENDENT_AMBULATORY_CARE_PROVIDER_SITE_OTHER): Payer: Medicare Other | Admitting: Internal Medicine

## 2017-12-22 ENCOUNTER — Other Ambulatory Visit: Payer: Self-pay

## 2017-12-22 VITALS — BP 133/81 | HR 59 | Temp 98.2°F | Ht 67.0 in | Wt 189.1 lb

## 2017-12-22 DIAGNOSIS — L02415 Cutaneous abscess of right lower limb: Secondary | ICD-10-CM

## 2017-12-22 MED ORDER — CLINDAMYCIN HCL 300 MG PO CAPS: 300.0000 mg | ORAL_CAPSULE | Freq: Four times a day (QID) | ORAL | 0 refills | Status: DC

## 2017-12-22 NOTE — Progress Notes (Signed)
   CC: follow up on wound care  HPI:  [name] is a 62 y.o. female with history noted below that presents to the acute care clinic for follow up on wound care for abscess s/p I&Don1/2 . Please see problem based charting for the status of patient's chronic medical condition.  Past Medical History:  Diagnosis Date  . Acquired lactose intolerance 09/24/2017  . 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  Non-functioning  . Aortic atherosclerosis ([place]) 09/24/2017   Asymptomatic, found on CT scan  . Blood transfusion without reported diagnosis   . Chronic Systolic Heart Failure 1/61/0960   Felt to be non-ischemic and secondary to hypertension.  Echo (05/28/2014): LVEF 25%.  Is not interested in AICD placement.  . Coronary artery disease involving native coronary artery of native heart without angina pectoris 11/09/2014   Cardiac cath (02/18/2014): Non-obstructive, mRCA 30%, dRCA 60%  . 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 Diverticulosis of colon 09/24/2017  . Essential hypertension 10/15/2013  . Gastroesophageal reflux disease 09/24/2017  . History of cerebrovascular accident 05/10/2013   Per patient report 6 previous strokes, most recent on 05/10/13.  No residual deficits.  . Hyperlipidemia   . Overweight (BMI 25.0-29.9) 09/24/2017  . Psoriasis 09/24/2017  . Seasonal allergic rhinitis due to pollen 09/24/2017   Spring and early Fall  . Small Bowel Obstruction (SBO) 01/13/2014   Ex-Lap & Lysis of Adhesion, 01/15/2014  . Tobacco use disorder 01/15/2014  . Type 2 diabetes mellitus with moderate nonproliferative diabetic retinopathy ([place]) 10/15/2013    Review of Systems:  Review of Systems  Constitutional: Negative for chills, fever and malaise/fatigue.  Gastrointestinal: Negative for nausea and vomiting.     Physical Exam:  Vitals:   12/22/17 0846  BP: 133/81  Pulse: (!)  59  Temp: 98.2 F (36.8 C)  TempSrc: Oral  SpO2: 100%  Weight: 189 lb 1.6 oz (85.8 kg)  Height: 5\' 7"  (1.702 m)   Physical Exam  Constitutional: She is well-developed, well-nourished, and in no distress.  Skin:  Right thigh abscess wound completely healed.  Right medial aspect of thigh indurated, warm to the tough and erythematous     Assessment & Plan:   See encounters tab for problem based medical decision making.   Patient seen with Dr. Daryll Drown

## 2017-12-22 NOTE — Patient Instructions (Signed)
Ms. Dock,  It was a pleasure seeing you today.  Please start taking your antibiotics.  A CT scan has been ordered for your leg.

## 2017-12-22 NOTE — Assessment & Plan Note (Signed)
Assessment: Rigth leg abscess  Patient has been followed in the [facility] for wound care of her abscess s/p I&D on 1/2.  Patient's wound has been healing nicely.  Today the area is completely approximated and no drainage noted.  However, the medial aspect of the wound is still endurated, warm to the touch and erythematous.  Did a beside ultrasound that showed fluid collection in the upper right thigh.  I am concerned for another abscess.  Will obtain a CT scan of leg for better assessment and do a 5 day course of clindamycin.  Patient has already had bactrim and doxycycline.  Will have patient follow up in one week.  Plan -follow up in one week  - CT scan of leg - clindamycin for 5 days

## 2017-12-27 NOTE — Progress Notes (Signed)
Internal Medicine Clinic Attending  I saw and evaluated the patient.  I personally confirmed the key portions of the history and exam documented by Dr. Hoffman and I reviewed pertinent patient test results.  The assessment, diagnosis, and plan were formulated together and I agree with the documentation in the resident's note.      

## 2017-12-29 ENCOUNTER — Other Ambulatory Visit: Payer: Self-pay

## 2017-12-29 ENCOUNTER — Encounter: Payer: Self-pay | Admitting: Internal Medicine

## 2017-12-29 ENCOUNTER — Ambulatory Visit (INDEPENDENT_AMBULATORY_CARE_PROVIDER_SITE_OTHER): Payer: Medicare Other | Admitting: Internal Medicine

## 2017-12-29 VITALS — BP 140/66 | HR 63 | Temp 98.5°F | Ht 67.0 in | Wt 190.0 lb

## 2017-12-29 DIAGNOSIS — I5022 Chronic systolic (congestive) heart failure: Secondary | ICD-10-CM

## 2017-12-29 DIAGNOSIS — Z8673 Personal history of transient ischemic attack (TIA), and cerebral infarction without residual deficits: Secondary | ICD-10-CM | POA: Diagnosis not present

## 2017-12-29 DIAGNOSIS — I110 Hypertensive heart disease with heart failure: Secondary | ICD-10-CM | POA: Diagnosis not present

## 2017-12-29 DIAGNOSIS — E11319 Type 2 diabetes mellitus with unspecified diabetic retinopathy without macular edema: Secondary | ICD-10-CM

## 2017-12-29 DIAGNOSIS — I2510 Atherosclerotic heart disease of native coronary artery without angina pectoris: Secondary | ICD-10-CM

## 2017-12-29 DIAGNOSIS — E785 Hyperlipidemia, unspecified: Secondary | ICD-10-CM | POA: Diagnosis not present

## 2017-12-29 DIAGNOSIS — L02415 Cutaneous abscess of right lower limb: Secondary | ICD-10-CM | POA: Diagnosis not present

## 2017-12-29 NOTE — Assessment & Plan Note (Addendum)
Patient presents for follow-up of her right upper thigh abscess status post I&D 1/2.  She has followed up with [name] on a weekly basis for the past month for this.  She has completed several courses of antibiotics including Bactrim, doxycycline, and clindamycin due to ongoing erythema, induration, and warmth around the incision.  Today she reports feeling well.  She denies any pain and states she has started to exercise.  Denies difficulty ambulating, fevers, and chills.  She did not notice a small abscess 1 cm L to the incision 2 days ago that drained spontaneously and is now resolved.  She describes it as bloody and denies purulent discharge.  Today her incision appears to be healing well.  No erythema, warmth, fluctuance, or discharge is noted.  There is significant induration below the lesion as well as circumferentially (~2-3 cm). LLE CT ordered at previous visit is scheduled for 2/15.  Explained to patient importance of this to further ongoing induration.  Will call results.  We will not prescribe antibiotics at this time as no signs/symptoms of active infection noted.  - CT LLE. Will call patient with results  - Patient advised to keep appointment with Dr. Eppie Gibson on 3/15  -Return precautions discussed

## 2017-12-29 NOTE — Progress Notes (Signed)
   CC: R thigh abscess follow up   HPI:  Alicia [name] is a 62 y.o. female with PMH listed below who presents to clinic for R thigh abscess follow up. Please see problem based assessment and plan for further details.   Past Medical History:  Diagnosis Date  . Acquired lactose intolerance 09/24/2017  . 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  Non-functioning  . Aortic atherosclerosis ([place]) 09/24/2017   Asymptomatic, found on CT scan  . Blood transfusion without reported diagnosis   . Chronic Systolic Heart Failure 05/06/4314   Felt to be non-ischemic and secondary to hypertension.  Echo (05/28/2014): LVEF 25%.  Is not interested in AICD placement.  . Coronary artery disease involving native coronary artery of native heart without angina pectoris 11/09/2014   Cardiac cath (02/18/2014): Non-obstructive, mRCA 30%, dRCA 60%  . 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 Diverticulosis of colon 09/24/2017  . Essential hypertension 10/15/2013  . Gastroesophageal reflux disease 09/24/2017  . History of cerebrovascular accident 05/10/2013   Per patient report 6 previous strokes, most recent on 05/10/13.  No residual deficits.  . Hyperlipidemia   . Overweight (BMI 25.0-29.9) 09/24/2017  . Psoriasis 09/24/2017  . Seasonal allergic rhinitis due to pollen 09/24/2017   Spring and early Fall  . Small Bowel Obstruction (SBO) 01/13/2014   Ex-Lap & Lysis of Adhesion, 01/15/2014  . Tobacco use disorder 01/15/2014  . 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  Musculoskeletal: Negative for joint pain and myalgias.  Neurological: Negative for focal weakness and weakness.  All other systems reviewed and are negative.   Physical  Exam:  There were no vitals filed for this visit.  General: very pleasant female, well-nourished, well-developed, in no acute distress  Ext: warm and well perfused, no peripheral edema noted  Derm: Incision on right upper thigh is healing well. There is no fluctuance, warmth, or erythema associated with it.  Patient does have significant area of induration below the lesion as well as circumferentially ~2-3 cm.  Induration above dilution mildly tender to palpation.  No discharge noted.   Assessment & Plan:   See Encounters Tab for problem based charting.  Patient seen with Dr. Lynnae January

## 2017-12-29 NOTE — Patient Instructions (Signed)
It was nice to meet you today, Ms. Spanbauer.  Your incision is healing well.  It will be very important that you get that CT scan of your leg on Neth of next week (2/15).  We will call you with the results of the CT scan.  You have an appointment with your regular doctor, Dr. Eppie Gibson, on 3/15.   FOLLOW-UP INSTRUCTIONS When: as needed  For: If develop fever, chills, malaise or notice redness, warmth, or pus coming out of your incision  What to bring: home medications

## 2017-12-31 NOTE — Progress Notes (Signed)
Internal Medicine Clinic Attending  Case discussed with Dr. Santos-Sanchez at the time of the visit.  We reviewed the resident's history and exam and pertinent patient test results.  I agree with the assessment, diagnosis, and plan of care documented in the resident's note.    

## 2018-01-06 NOTE — Addendum Note (Signed)
Addended by: Gilles Chiquito B on: 01/06/2018 03:22 PM   Modules accepted: Level of Service

## 2018-01-07 ENCOUNTER — Ambulatory Visit (HOSPITAL_COMMUNITY)
Admission: RE | Admit: 2018-01-07 | Discharge: 2018-01-07 | Disposition: A | Discharge status: Home / Self Care | Payer: Medicare Other | Source: Ambulatory Visit | Attending: Internal Medicine | Admitting: Internal Medicine

## 2018-01-07 DIAGNOSIS — L02415 Cutaneous abscess of right lower limb: Secondary | ICD-10-CM | POA: Insufficient documentation

## 2018-01-07 DIAGNOSIS — I739 Peripheral vascular disease, unspecified: Secondary | ICD-10-CM | POA: Insufficient documentation

## 2018-01-07 LAB — POCT I-STAT CREATININE: Creatinine, Ser: 0.7 mg/dL (ref 0.44–1.00)

## 2018-01-07 MED ORDER — IOPAMIDOL (ISOVUE-300) INJECTION 61%
INTRAVENOUS | Status: AC
  Administered 2018-01-07: 100 mL
  Filled 2018-01-07: qty 100

## 2018-01-11 ENCOUNTER — Telehealth: Payer: Self-pay | Admitting: Internal Medicine

## 2018-01-11 NOTE — Telephone Encounter (Signed)
Attempted to Call Ms. Schuenemann to discuss results of leg CT with no answer.  Will route to nursing pool.

## 2018-01-13 ENCOUNTER — Other Ambulatory Visit: Payer: Self-pay | Admitting: Internal Medicine

## 2018-01-13 DIAGNOSIS — Z1231 Encounter for screening mammogram for malignant neoplasm of breast: Secondary | ICD-10-CM

## 2018-01-17 ENCOUNTER — Telehealth: Payer: Self-pay | Admitting: Internal Medicine

## 2018-01-17 NOTE — Telephone Encounter (Signed)
Patient is calling lab results, she is very concern

## 2018-01-18 ENCOUNTER — Telehealth: Payer: Self-pay | Admitting: Internal Medicine

## 2018-01-18 NOTE — Telephone Encounter (Signed)
WANTS A CALL BACK WITH RESULTS FOR MRI

## 2018-01-19 NOTE — Telephone Encounter (Signed)
I called her yesterday and could not reach her.  I will try again this morning. Thanks!

## 2018-01-20 NOTE — Telephone Encounter (Signed)
Alicia [name]! Would you mind calling her back and letting her know her CT scan was fine and there is no signs of infection in her leg. I have called her multiple times and she has not answered.

## 2018-01-21 NOTE — Telephone Encounter (Signed)
No answer, message left on recorder that "CT was fine" and number to call back if she has any questions.Regenia Skeeter, Porshea Janowski Cassady3/1/201910:35 AM

## 2018-01-28 ENCOUNTER — Ambulatory Visit: Payer: Medicare Other | Admitting: Internal Medicine

## 2018-01-28 DIAGNOSIS — E113393 Type 2 diabetes mellitus with moderate nonproliferative diabetic retinopathy without macular edema, bilateral: Secondary | ICD-10-CM | POA: Diagnosis not present

## 2018-01-28 DIAGNOSIS — H1711 Central corneal opacity, right eye: Secondary | ICD-10-CM | POA: Diagnosis not present

## 2018-01-28 DIAGNOSIS — H2513 Age-related nuclear cataract, bilateral: Secondary | ICD-10-CM | POA: Diagnosis not present

## 2018-01-28 DIAGNOSIS — H15831 Staphyloma posticum, right eye: Secondary | ICD-10-CM | POA: Diagnosis not present

## 2018-01-28 DIAGNOSIS — H17822 Peripheral opacity of cornea, left eye: Secondary | ICD-10-CM | POA: Diagnosis not present

## 2018-01-28 LAB — HM DIABETES EYE EXAM

## 2018-01-31 ENCOUNTER — Encounter: Payer: Self-pay | Admitting: *Deleted

## 2018-02-04 ENCOUNTER — Ambulatory Visit (INDEPENDENT_AMBULATORY_CARE_PROVIDER_SITE_OTHER): Payer: Medicare Other | Admitting: Internal Medicine

## 2018-02-04 ENCOUNTER — Encounter: Payer: Self-pay | Admitting: Internal Medicine

## 2018-02-04 ENCOUNTER — Other Ambulatory Visit: Payer: Self-pay

## 2018-02-04 VITALS — BP 144/76 | HR 83 | Temp 98.3°F | Wt 200.3 lb

## 2018-02-04 DIAGNOSIS — I110 Hypertensive heart disease with heart failure: Secondary | ICD-10-CM

## 2018-02-04 DIAGNOSIS — Z7982 Long term (current) use of aspirin: Secondary | ICD-10-CM

## 2018-02-04 DIAGNOSIS — B9689 Other specified bacterial agents as the cause of diseases classified elsewhere: Secondary | ICD-10-CM

## 2018-02-04 DIAGNOSIS — I2510 Atherosclerotic heart disease of native coronary artery without angina pectoris: Secondary | ICD-10-CM

## 2018-02-04 DIAGNOSIS — J301 Allergic rhinitis due to pollen: Secondary | ICD-10-CM

## 2018-02-04 DIAGNOSIS — R3989 Other symptoms and signs involving the genitourinary system: Secondary | ICD-10-CM | POA: Diagnosis not present

## 2018-02-04 DIAGNOSIS — L02415 Cutaneous abscess of right lower limb: Secondary | ICD-10-CM | POA: Diagnosis not present

## 2018-02-04 DIAGNOSIS — Z79899 Other long term (current) drug therapy: Secondary | ICD-10-CM

## 2018-02-04 DIAGNOSIS — E785 Hyperlipidemia, unspecified: Secondary | ICD-10-CM | POA: Diagnosis not present

## 2018-02-04 DIAGNOSIS — Z6831 Body mass index (BMI) 31.0-31.9, adult: Secondary | ICD-10-CM

## 2018-02-04 DIAGNOSIS — F17200 Nicotine dependence, unspecified, uncomplicated: Secondary | ICD-10-CM

## 2018-02-04 DIAGNOSIS — Z794 Long term (current) use of insulin: Secondary | ICD-10-CM

## 2018-02-04 DIAGNOSIS — E113393 Type 2 diabetes mellitus with moderate nonproliferative diabetic retinopathy without macular edema, bilateral: Secondary | ICD-10-CM

## 2018-02-04 DIAGNOSIS — I5022 Chronic systolic (congestive) heart failure: Secondary | ICD-10-CM

## 2018-02-04 DIAGNOSIS — N390 Urinary tract infection, site not specified: Secondary | ICD-10-CM

## 2018-02-04 DIAGNOSIS — I700 Atherosclerosis of aorta: Secondary | ICD-10-CM

## 2018-02-04 DIAGNOSIS — I10 Essential (primary) hypertension: Secondary | ICD-10-CM

## 2018-02-04 DIAGNOSIS — B961 Klebsiella pneumoniae [K. pneumoniae] as the cause of diseases classified elsewhere: Secondary | ICD-10-CM | POA: Diagnosis not present

## 2018-02-04 DIAGNOSIS — Z0000 Encounter for general adult medical examination without abnormal findings: Secondary | ICD-10-CM

## 2018-02-04 DIAGNOSIS — F17210 Nicotine dependence, cigarettes, uncomplicated: Secondary | ICD-10-CM

## 2018-02-04 DIAGNOSIS — E7800 Pure hypercholesterolemia, unspecified: Secondary | ICD-10-CM

## 2018-02-04 DIAGNOSIS — E669 Obesity, unspecified: Secondary | ICD-10-CM

## 2018-02-04 LAB — POCT GLYCOSYLATED HEMOGLOBIN (HGB A1C): HEMOGLOBIN A1C: 9.6

## 2018-02-04 LAB — GLUCOSE, CAPILLARY: Glucose-Capillary: 158 mg/dL — ABNORMAL HIGH (ref 65–99)

## 2018-02-04 MED ORDER — INSULIN LISPRO 100 UNIT/ML (KWIKPEN): PEN_INJECTOR | SUBCUTANEOUS | 11 refills | Status: DC

## 2018-02-04 NOTE — Assessment & Plan Note (Signed)
Assessment  She has had no symptoms consistent with angina. Her current cardiovascular regimen consists of aspirin 81 mg by mouth daily, amlodipine 10 mg by mouth daily, carvedilol 12.5 mg by mouth twice daily, lisinopril 20 mg by mouth daily, and atorvastatin 40 mg by mouth every night.  Plan  We will continue her cardiovascular regimen as outlined above and reassess for evidence of angina at the follow-up visit.

## 2018-02-04 NOTE — Assessment & Plan Note (Signed)
Assessment  She has bilateral periorbital edema consistent with her allergies. She has a known reaction to tree pollen and this is currently active. She has been taking over-the-counter antihistamines with some symptomatic success.  Plan  We will continue with the over-the-counter antihistamines and reassess the efficacy at the follow-up visit. If necessary, we will provide her with prescription strength antihistamines for her allergic symptoms.

## 2018-02-04 NOTE — Assessment & Plan Note (Signed)
Assessment  She denies any lower extremity edema, orthopnea, or paroxysmal nocturnal dyspnea. This is on carvedilol 12.5 mg by mouth twice daily, lisinopril 20 mg by mouth daily, and Lasix 40 mg by mouth every morning.  Plan  We will continue the carvedilol, lisinopril, and Lasix at the current doses and reassess for evidence of decompensation of her chronic systolic heart failure at the follow-up visit. She may have room to move upwords in her beta blocker and ACE inhibitor therapy and this will be reassessed at the follow-up visit.

## 2018-02-04 NOTE — Assessment & Plan Note (Signed)
Assessment  She is tolerating the atorvastatin 40 mg by mouth daily without myalgias.  Plan  We will continue the high intensity statin and reassess for evidence of intolerances at the follow-up visit.

## 2018-02-04 NOTE — Assessment & Plan Note (Signed)
She is up-to-date in her health care maintenance with the exception of colon cancer screening.We discussed the options of colonoscopy and yearly stool occult blood testing. At this point she is still unsure of which modality would be best for her. She asked that we bring the issue up at the follow-up visit. That said, if we do detect a colovesicular fistula she will require colonoscopy for further evaluation and this would address this particular issue.

## 2018-02-04 NOTE — Patient Instructions (Signed)
It was good to see you again.  1) Keep taking your medications as you are.  I did increase the dinner insulin to 15 units.  2) I got some urine for studies.  I will call you next week when I get the results.  3) I ordered a CT scan to try to figure out why there is air in the bladder.  I will call you when they send me the results.  I will see you back in 3 months, sooner if necessary.

## 2018-02-04 NOTE — Assessment & Plan Note (Signed)
Assessment  She quit smoking temporarily [place] Although she was proud of herself for this, she noticed an increase in her weight at that time. She restarted smoking when she had a stressful personal event. She is not mentally ready to quit at this time but is very close to making another attempt, which she prefers to do cold [place] without medication.  Plan  She was praised for her smoking cessation and encouraged to try quitting again. She was reminded that if she were to change her mind and wanted pharmacologic help she should call the clinic and I would provide it for her. We will re-access the success in her attempts at smoking cessation at the follow-up visit.

## 2018-02-04 NOTE — Assessment & Plan Note (Signed)
ssessment  She has had no signs or symptoms consistent with peripheral vascular occlusive disease or claudication. Therefore her aortic atherosclerosis remains asymptomatic.  Plan  We will continue aggressive risk factor modification including improved control of her diabetes, hypertension, and hyperlipidemia. We are also encouraging her to quit smoking. We will reassess for the development of claudication at the follow-up visit.

## 2018-02-04 NOTE — Assessment & Plan Note (Signed)
Assessment  Her blood pressure was slightly elevated today at 144/76.  This is on amlodipine 10 mg by mouth daily, lisinopril 20 mg by mouth daily, and carvedilol 12.5 mg by mouth twice daily.  Plan  We will continue the amlodipine at 10 mg by mouth daily, lisinopril at 20 mg by mouth daily, and carvedilol at 12.5 mg by mouth twice daily. If the blood pressure remains above target at the follow-up visit we will consider increasing the lisinopril to 40 mg by mouth daily or carvedilol to 25 mg by mouth twice daily if the pulse could tolerate.

## 2018-02-04 NOTE — Assessment & Plan Note (Signed)
Assessment  Her right thigh wound has healed completely without evidence of purulence, fluctuance, or erythema.  Plan  We will follow expectantly but I believe this issue has resolved.

## 2018-02-04 NOTE — Progress Notes (Signed)
   Subjective:    Patient ID: Alicia [name], female    DOB: 11-23-1956, 62 y.o.   MRN: [id number]  HPI  Alicia [name] is here for follow-up of her type 2 diabetes with moderate nonproliferative diabetic retinopathy chronic systolic heart failure aortic atherosclerosis, essential hypertension, hyperlipidemia, tobacco abuse disorder, coronary artery disease without angina,seasonal allergic rhinitis due to pollen, obesity,abscess of the right thigh, and pneumaturia. Please see the A&P for the status of the pt's chronic medical problems.  Review of Systems  Constitutional: Positive for unexpected weight change. Negative for activity change, chills and fever.       Put on weight after she temporarily quit smoking.  HENT: Positive for facial swelling.   Respiratory: Negative for chest tightness, shortness of breath and wheezing.   Cardiovascular: Negative for chest pain, palpitations and leg swelling.  Gastrointestinal: Negative for abdominal distention, abdominal pain, diarrhea, nausea and vomiting.  Musculoskeletal: Negative for arthralgias, back pain, joint swelling and myalgias.  Skin: Negative for rash and wound.       Right anterior thigh wound healed.  Allergic/Immunologic: Positive for environmental allergies.      Objective:   Physical Exam  Constitutional: She is oriented to person, place, and time. She appears well-developed and well-nourished. No distress.  HENT:  Head: Normocephalic and atraumatic.  Eyes: Conjunctivae are normal. Right eye exhibits no discharge. Left eye exhibits no discharge. No scleral icterus.  Bilateral periorbital edema  Musculoskeletal: Normal range of motion. She exhibits no edema, tenderness or deformity.  Right anterior thigh lesion closed without erythema, fluctuance, or pain  Neurological: She is alert and oriented to person, place, and time. She exhibits normal muscle tone.  Skin: Skin is warm and dry. No rash noted. She is not diaphoretic. No  erythema.  Psychiatric: She has a normal mood and affect. Her behavior is normal. Judgment and thought content normal.  Nursing note and vitals reviewed.     Assessment & Plan:   Please see problem oriented charting.

## 2018-02-04 NOTE — Assessment & Plan Note (Signed)
Assessment  She was initially noted to have pneumaturia in September. After her thigh abscess she underwent CT scanning of the right thigh which happened to also demonstrate air in the bladder. She denies any dysuria or frequency, but does note a foul odor to her urine which has been clear, as well as pneumaturia that has increased over the last several months. The cause of this pneumaturia is likely related to cystitis in a diabetic, but there is always the concern for a colovesicular fistula.  Plan  We obtained a urinalysis and urine for culture today. We will follow this up and treat accordingly. Since there is air in the bladder we decided to assess for a colovesicular fistula by obtaining a CT scan of the abdomen and pelvis with oral and rectal contrast. We asked that IV contrast not be provided. This has been shown to diagnose a colovesicular fistula in patients with air in the bladder at a rate of 90-100%. Further evaluation and therapy are pending the results of this evaluation.

## 2018-02-04 NOTE — Assessment & Plan Note (Signed)
Assessment  Her hemoglobin A1c is improved to 9.6 today down from 11.0. This is on Lantus 50 units daily, NovoLog 10 units with meals, bydureon 1 mL weekly, Januvia 100 mg by mouth daily, and metformin 1000 mg by mouth twice daily.  She denies any lows.  I believe some of her diabetic control has been hindered by the weight gain associated with the temporary cessation of smoking. Although she did not bring her meter in for review, she states that her morning fasting blood sugars are generally in the 100s. This suggests to me that the mealtime coverage is likely inadequate.  Plan  We will continue theLantus 50 units daily, NovoLog 10 units with breakfast and lunch, bydureon 1 mL weekly, Januvia 100 mg by mouth daily, and metformin 1000 mg by mouth twice daily.  Increased activity with the improvement in the weather was also recommended to help with control of her sugars. We discussed the importance of weight loss, although I asked her to concentrate more on tobacco cessation at this time. She sometimes skips lunch and therefore does not use NovoLog 3 times a day but generally limits it to twice a day. Her biggest meal is dinner and I therefore asked her to increase her NovoLog to 15 units with dinner and keeping it at 10 units with breakfast and lunch. We will reassess her glycemic control with the above changes and increased activity at the follow-up visit with a repeat hemoglobin A1c.

## 2018-02-04 NOTE — Assessment & Plan Note (Signed)
Assessment  She has, as expected, gained weight with her temporary smoking cessation. We discussed the importance of diet when quitting smoking given the average increase of weight in the range of 10 pounds. We discussed that the benefits of smoking cessation outweigh the increase in her weight if she was successful at quitting her smoking given her multiple comorbidities.  Plan  She will first concentrate on smoking cessation and watching her portion sizes when quitting. She is now aware of the correlation between weight gain in smoking cessation. That said, she knows the benefits of smoking cessation outweigh the risks associated with weight gain at this time. We will reassess her weight at the follow-up visit.

## 2018-02-05 LAB — URINALYSIS, COMPLETE
Bilirubin, UA: NEGATIVE
GLUCOSE, UA: NEGATIVE
Ketones, UA: NEGATIVE
Leukocytes, UA: NEGATIVE
Nitrite, UA: NEGATIVE
PH UA: 7 (ref 5.0–7.5)
RBC, UA: NEGATIVE
Specific Gravity, UA: 1.016 (ref 1.005–1.030)
UUROB: 1 mg/dL (ref 0.2–1.0)

## 2018-02-05 LAB — MICROSCOPIC EXAMINATION

## 2018-02-07 NOTE — Progress Notes (Signed)
Patient ID: Alicia [name], female   DOB: 1956-11-23, 62 y.o.   MRN: [id number]  Urinalysis: Specific gravity 1.016, pH 7.0, cloudy yellow,3+ protein, nitrite negative, 0-2 red blood cells per high-power field, 0-5 white blood cells per high-power field, few bacteria.  Urine culture: Preliminary result, > 100,000 colony-forming units of gram-negative rods. Identification and sensitivities pending.  I tried calling Ms. Punches to give her an interim report on the results of your urine studies. I received an unidentified answering machine. I left a message that would be calling back later. At this point, I am waiting an identification and sensitivities prior to initiating antibiotic therapy.

## 2018-02-08 LAB — URINE CULTURE

## 2018-02-09 ENCOUNTER — Telehealth: Payer: Self-pay | Admitting: *Deleted

## 2018-02-09 MED ORDER — SULFAMETHOXAZOLE-TRIMETHOPRIM 800-160 MG PO TABS: 1.0000 | ORAL_TABLET | Freq: Two times a day (BID) | ORAL | 0 refills | Status: DC

## 2018-02-09 NOTE — Progress Notes (Signed)
Patient ID: Alicia [name], female   DOB: [birth year], 62 y.o.   MRN: [id number]  Urine culture showed Klebsiella pneumonia sensitive to Bactrim.  Will therefore prescribe Bactrim DS 1 tablet PO BID X 7 days.  Although this may be the explanation for the pneumaturia, we are still going to complete the evaluation of the colon to rule out other etiologies.

## 2018-02-09 NOTE — Telephone Encounter (Signed)
Pt presents to front desk stating she had missed a call from her pcp.  Pt informed that UA showed UTI and MD was waiting for culture and sensitivity results to see which medication would be best to treat infection.  Pt states she does not currently have a cell, but will check with her pharmacy at the end of the day.Regenia Skeeter, Javin Nong Cassady3/20/20199:22 AM

## 2018-02-09 NOTE — Telephone Encounter (Signed)
Please tell her she has a urinary tract infection and that I just sent a prescription for Bactrim DS.  She should take 1 tablet twice a day for 7 days.  We still need to complete the work-up with a CT scan as we discussed at the appointment.  Thanks.

## 2018-02-09 NOTE — Addendum Note (Signed)
Addended by: Oval Linsey D on: 02/09/2018 10:57 AM   Modules accepted: Orders

## 2018-02-16 ENCOUNTER — Ambulatory Visit
Admission: RE | Admit: 2018-02-16 | Discharge: 2018-02-16 | Disposition: A | Discharge status: Home / Self Care | Payer: Medicare Other | Source: Ambulatory Visit | Attending: Internal Medicine | Admitting: Internal Medicine

## 2018-02-16 DIAGNOSIS — Z1231 Encounter for screening mammogram for malignant neoplasm of breast: Secondary | ICD-10-CM | POA: Diagnosis not present

## 2018-02-18 ENCOUNTER — Ambulatory Visit (HOSPITAL_COMMUNITY): Admission: RE | Admit: 2018-02-18 | Payer: Medicare Other | Source: Ambulatory Visit

## 2018-02-18 ENCOUNTER — Ambulatory Visit (HOSPITAL_COMMUNITY): Payer: Medicare Other

## 2018-03-02 ENCOUNTER — Ambulatory Visit (HOSPITAL_COMMUNITY)
Admission: RE | Admit: 2018-03-02 | Discharge: 2018-03-02 | Disposition: A | Discharge status: Home / Self Care | Payer: Medicare Other | Source: Ambulatory Visit | Attending: Internal Medicine | Admitting: Internal Medicine

## 2018-03-02 ENCOUNTER — Encounter (HOSPITAL_COMMUNITY): Payer: Self-pay

## 2018-03-02 DIAGNOSIS — R3989 Other symptoms and signs involving the genitourinary system: Secondary | ICD-10-CM

## 2018-03-04 ENCOUNTER — Telehealth: Payer: Self-pay

## 2018-03-04 NOTE — Telephone Encounter (Signed)
Requesting xray result. Please call pt back. 

## 2018-03-04 NOTE — Telephone Encounter (Signed)
Received call from radiology dept Maudie Mercury) stating that pt had a CT Abd Pelvis on 03/02/18-order was for both oral and rectal contrast.  Pt was given oral, but not the rectal contrast and will need to return for further imaging at no charge to her.    I have called multiple times today and left messages on both emergency contact and cell number. Will send to triage pool to contact patient on Monday as I will be out of the office.Despina Hidden Cassady4/12/20195:15 PM

## 2018-03-06 ENCOUNTER — Emergency Department (HOSPITAL_COMMUNITY)
Admission: EM | Admit: 2018-03-06 | Discharge: 2018-03-06 | Disposition: A | Discharge status: Home / Self Care | Payer: Medicare Other | Attending: Emergency Medicine | Admitting: Emergency Medicine

## 2018-03-06 ENCOUNTER — Other Ambulatory Visit: Payer: Self-pay

## 2018-03-06 ENCOUNTER — Emergency Department (HOSPITAL_COMMUNITY): Payer: Medicare Other

## 2018-03-06 ENCOUNTER — Encounter (HOSPITAL_COMMUNITY): Payer: Self-pay | Admitting: Emergency Medicine

## 2018-03-06 DIAGNOSIS — Z794 Long term (current) use of insulin: Secondary | ICD-10-CM | POA: Diagnosis not present

## 2018-03-06 DIAGNOSIS — F17210 Nicotine dependence, cigarettes, uncomplicated: Secondary | ICD-10-CM | POA: Diagnosis not present

## 2018-03-06 DIAGNOSIS — Z79899 Other long term (current) drug therapy: Secondary | ICD-10-CM | POA: Insufficient documentation

## 2018-03-06 DIAGNOSIS — Z7982 Long term (current) use of aspirin: Secondary | ICD-10-CM | POA: Diagnosis not present

## 2018-03-06 DIAGNOSIS — R05 Cough: Secondary | ICD-10-CM | POA: Diagnosis not present

## 2018-03-06 DIAGNOSIS — R600 Localized edema: Secondary | ICD-10-CM | POA: Diagnosis not present

## 2018-03-06 DIAGNOSIS — Z8673 Personal history of transient ischemic attack (TIA), and cerebral infarction without residual deficits: Secondary | ICD-10-CM | POA: Diagnosis not present

## 2018-03-06 DIAGNOSIS — R609 Edema, unspecified: Secondary | ICD-10-CM | POA: Diagnosis not present

## 2018-03-06 DIAGNOSIS — R002 Palpitations: Secondary | ICD-10-CM | POA: Diagnosis not present

## 2018-03-06 DIAGNOSIS — I110 Hypertensive heart disease with heart failure: Secondary | ICD-10-CM | POA: Insufficient documentation

## 2018-03-06 DIAGNOSIS — I5022 Chronic systolic (congestive) heart failure: Secondary | ICD-10-CM | POA: Insufficient documentation

## 2018-03-06 DIAGNOSIS — R0602 Shortness of breath: Secondary | ICD-10-CM | POA: Diagnosis not present

## 2018-03-06 DIAGNOSIS — E119 Type 2 diabetes mellitus without complications: Secondary | ICD-10-CM | POA: Diagnosis not present

## 2018-03-06 DIAGNOSIS — I2510 Atherosclerotic heart disease of native coronary artery without angina pectoris: Secondary | ICD-10-CM | POA: Diagnosis not present

## 2018-03-06 LAB — CBC
HCT: 37.2 % (ref 36.0–46.0)
Hemoglobin: 11.6 g/dL — ABNORMAL LOW (ref 12.0–15.0)
MCH: 25.9 pg — ABNORMAL LOW (ref 26.0–34.0)
MCHC: 31.2 g/dL (ref 30.0–36.0)
MCV: 83 fL (ref 78.0–100.0)
Platelets: 256 10*3/uL (ref 150–400)
RBC: 4.48 MIL/uL (ref 3.87–5.11)
RDW: 15.9 % — ABNORMAL HIGH (ref 11.5–15.5)
WBC: 7.2 10*3/uL (ref 4.0–10.5)

## 2018-03-06 LAB — I-STAT TROPONIN, ED
TROPONIN I, POC: 0.01 ng/mL (ref 0.00–0.08)
Troponin i, poc: 0 ng/mL (ref 0.00–0.08)

## 2018-03-06 LAB — BASIC METABOLIC PANEL WITH GFR
Anion gap: 8 (ref 5–15)
BUN: 8 mg/dL (ref 6–20)
CO2: 28 mmol/L (ref 22–32)
Calcium: 9 mg/dL (ref 8.9–10.3)
Chloride: 100 mmol/L — ABNORMAL LOW (ref 101–111)
Creatinine, Ser: 0.59 mg/dL (ref 0.44–1.00)
GFR calc Af Amer: >60 mL/min
GFR calc non Af Amer: >60 mL/min
Glucose, Bld: 222 mg/dL — ABNORMAL HIGH (ref 65–99)
Potassium: 3.5 mmol/L (ref 3.5–5.1)
Sodium: 136 mmol/L (ref 135–145)

## 2018-03-06 LAB — I-STAT BETA HCG BLOOD, ED (MC, WL, AP ONLY): I-stat hCG, quantitative: <5 m[IU]/mL

## 2018-03-06 NOTE — ED Notes (Signed)
Patients O2 was at 90% while ambulating. Patient showed no signs of difficulty breathing and stated that she was not dizzy. PA Avie Echevaria has been notified.

## 2018-03-06 NOTE — ED Notes (Signed)
Patient verbalizes understanding of discharge instructions. Opportunity for questioning and answers were provided. Armband removed by staff, pt discharged from ED ambulatory.   

## 2018-03-06 NOTE — Discharge Instructions (Signed)
As discussed, follow-up with your primary care provider in the morning.  Take your medications as prescribed and return if symptoms worsen or new concerning symptoms in the meantime.
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
## 2018-03-06 NOTE — ED Notes (Signed)
Patient ambulatory to bathroom with steady gait at this time 

## 2018-03-06 NOTE — ED Triage Notes (Signed)
Patient presents to ED for generalized swelling, hx of CHF, states she has gained approx 20lbs in the last month.  Patient seen recently for abdominal pain.  Patient states her abdomen is also swollen.  Non pitting in lower extremities.

## 2018-03-06 NOTE — ED Notes (Signed)
Lab to add on BNP.  °

## 2018-03-06 NOTE — ED Notes (Signed)
ED Provider at bedside. 

## 2018-03-07 NOTE — Telephone Encounter (Signed)
Called pt (both numbers on chart) - no answer; left message to call the office.

## 2018-03-08 NOTE — Telephone Encounter (Signed)
Left message on patient's VM requesting return call. Hubbard Hartshorn, RN, BSN

## 2018-03-10 NOTE — Telephone Encounter (Signed)
Patient has appt in Radiology for CT tomorrow 03/11/2018. Hubbard Hartshorn, RN, BSN

## 2018-03-11 ENCOUNTER — Encounter (HOSPITAL_COMMUNITY): Payer: Self-pay

## 2018-03-11 ENCOUNTER — Ambulatory Visit (HOSPITAL_COMMUNITY)
Admission: RE | Admit: 2018-03-11 | Discharge: 2018-03-11 | Disposition: A | Discharge status: Home / Self Care | Payer: Medicare Other | Source: Ambulatory Visit | Attending: Internal Medicine | Admitting: Internal Medicine

## 2018-03-11 ENCOUNTER — Ambulatory Visit (HOSPITAL_COMMUNITY): Payer: Medicare Other

## 2018-03-11 DIAGNOSIS — N852 Hypertrophy of uterus: Secondary | ICD-10-CM | POA: Diagnosis not present

## 2018-03-11 DIAGNOSIS — R3989 Other symptoms and signs involving the genitourinary system: Secondary | ICD-10-CM | POA: Insufficient documentation

## 2018-03-11 DIAGNOSIS — K439 Ventral hernia without obstruction or gangrene: Secondary | ICD-10-CM | POA: Diagnosis not present

## 2018-03-11 DIAGNOSIS — I517 Cardiomegaly: Secondary | ICD-10-CM | POA: Diagnosis not present

## 2018-03-11 DIAGNOSIS — K5730 Diverticulosis of large intestine without perforation or abscess without bleeding: Secondary | ICD-10-CM | POA: Diagnosis not present

## 2018-03-11 MED ORDER — IOPAMIDOL (ISOVUE-300) INJECTION 61%
INTRAVENOUS | Status: AC
  Filled 2018-03-11: qty 30

## 2018-03-11 MED ORDER — IOPAMIDOL (ISOVUE-300) INJECTION 61%
30.0000 mL | Freq: Once | INTRAVENOUS | Status: AC | PRN
  Administered 2018-03-11: 30 mL

## 2018-03-23 ENCOUNTER — Telehealth: Payer: Self-pay | Admitting: Dietician

## 2018-03-23 ENCOUNTER — Encounter: Payer: Self-pay | Admitting: Internal Medicine

## 2018-03-23 ENCOUNTER — Ambulatory Visit (INDEPENDENT_AMBULATORY_CARE_PROVIDER_SITE_OTHER): Payer: Medicare Other | Admitting: Internal Medicine

## 2018-03-23 ENCOUNTER — Encounter: Payer: Self-pay | Admitting: Dietician

## 2018-03-23 VITALS — BP 153/69 | HR 55 | Temp 98.4°F | Wt 208.7 lb

## 2018-03-23 DIAGNOSIS — R05 Cough: Secondary | ICD-10-CM | POA: Insufficient documentation

## 2018-03-23 DIAGNOSIS — R0902 Hypoxemia: Secondary | ICD-10-CM

## 2018-03-23 DIAGNOSIS — L02211 Cutaneous abscess of abdominal wall: Secondary | ICD-10-CM | POA: Diagnosis not present

## 2018-03-23 DIAGNOSIS — R0602 Shortness of breath: Secondary | ICD-10-CM | POA: Diagnosis not present

## 2018-03-23 DIAGNOSIS — Z87891 Personal history of nicotine dependence: Secondary | ICD-10-CM | POA: Diagnosis not present

## 2018-03-23 DIAGNOSIS — R058 Other specified cough: Secondary | ICD-10-CM | POA: Insufficient documentation

## 2018-03-23 DIAGNOSIS — F17200 Nicotine dependence, unspecified, uncomplicated: Secondary | ICD-10-CM

## 2018-03-23 MED ORDER — PREDNISONE 20 MG PO TABS: 40.0000 mg | ORAL_TABLET | Freq: Every day | ORAL | 0 refills | Status: DC

## 2018-03-23 MED ORDER — DOXYCYCLINE HYCLATE 100 MG PO CAPS: 100.0000 mg | ORAL_CAPSULE | Freq: Two times a day (BID) | ORAL | 0 refills | Status: DC

## 2018-03-23 NOTE — Assessment & Plan Note (Signed)
  Assessment: History of asthma, with smoking history of 1/2 ppd x 40 years. No PFT's on file. Patient attested to having previously had PFT's but >5 years out at an alternative facility in another state. CXR on 4/14 unremarkable but symptoms began therafter. Given her SpO2 93%, increased sputum and cough without fever this is most concerning for a COPD exacerbation, mild.  <60yo, infrequent exacerbation, no PFT's on file   Plan: Order for PFT's placed. Prednisone 40mg  x 5 days Antibiotics-double cover with doxycycline for skin and pulmonary process with a 7 days course

## 2018-03-23 NOTE — Progress Notes (Signed)
Medicine attending: I personally interviewed and briefly examined this patient on the day of the patient visit and reviewed pertinent clinical ,laboratory data  with resident physician Dr. Kathi Ludwig and we discussed a management plan. Patient in to be evaluated for a minor asthma flare. Lungs clear on my exam & no pharyngisits Noted to have a cutaneous abscess left abdominal wall. No fever. CBC 2 weeks ago with no leukocytosis. Dr Lemmie Evens did a bedside I&D & we will prescribe a 10 d course of doxycycline.

## 2018-03-23 NOTE — Patient Instructions (Addendum)
FOLLOW-UP INSTRUCTIONS When: 6 days with Dr. Berline Lopes For: Abscess evaluation What to bring: Your medications   If at any time your symptoms were to worsen please call our clinic and schedule an appointment sooner than next Monday.  Please pick up your antibiotics and steroids. You will take the antibiotic doxycycline for 7 days at 100mg  two times per day. The steroids should be taken with breakfast each morning for five days but you may start treatment today when you pick up the medication.   You may use a cold compress and ibuprofen 600mg  three times per day for pain as needed.

## 2018-03-23 NOTE — Progress Notes (Signed)
CC: "a cough"  HPI:Alicia [name] is a 62 y.o. female who presents with a three week history of productive cough with clear/white phlegm produced, worse when lying flat with a choking sensation, no fever/chills, denied myalgias.  Assessment: History of asthma, with smoking history of 1/2 ppd x 40 years. No PFT's on file. Patient attested to having previously had PFT's but >5 years out at an alternative facility in another state. CXR on 4/14 unremarkable but symptoms began therafter. Given her SpO2 93%, increased sputum and cough without fever this is most concerning for a COPD exacerbation, mild.  <65yo, infrequent exacerbation, no PFT's on file   Plan: Order for PFT's placed. Prednisone 40mg  x 5 days Antibiotics-double cover with doxycycline for skin and pulmonary process with a 7 days course  Abdominal Abscess: Assessment: At insulin injection site on abdomin x 2 weeks with purulent discharge and pain. Similar in appearance to her most recent abscess on her Denied fever again. Site is worsening but pain decreased following rupture. Most concerning for abscess but given her abdominal surgery history I am also mildly concerned that this may represent a fistula and will ultrasound to better evaluate.   Plan: Ultrasound revealed a small 6x7cm circumferential loculated abscess with minimal fluid pocket. This was lanced with 1-22ml purulent discharge returned.  Course of doxycycline to double cover COPD and skin infection for MRSA given the nature recurrence of the abscess. No culture performed as I did not feel as if the lesion was extensive enough to limit resident skin bacterial contamination to yield a reliable result. Return in 6 days to determine resolution of symptoms.  Will perform repeat I&D with culture if persistent and place referral to surgery for more extensive I&D given the loculated nature of the abscess.  Cold compress PRN Ibuprofen for pain (last Cr 0.59)   Past Medical  History:  Diagnosis Date  . Acquired lactose intolerance 09/24/2017  . 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  Non-functioning  . Aortic atherosclerosis ([place]) 09/24/2017   Asymptomatic, found on CT scan  . Blood transfusion without reported diagnosis   . Chronic Systolic Heart Failure 1/82/9937   Felt to be non-ischemic and secondary to hypertension.  Echo (05/28/2014): LVEF 25%.  Is not interested in AICD placement.  . Coronary artery disease involving native coronary artery of native heart without angina pectoris 11/09/2014   Cardiac cath (02/18/2014): Non-obstructive, mRCA 30%, dRCA 60%  . 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 Diverticulosis of colon 09/24/2017  . Essential hypertension 10/15/2013  . Gastroesophageal reflux disease 09/24/2017  . History of cerebrovascular accident 05/10/2013   Per patient report 6 previous strokes, most recent on 05/10/13.  No residual deficits.  . Hyperlipidemia   . Overweight (BMI 25.0-29.9) 09/24/2017  . Psoriasis 09/24/2017  . Seasonal allergic rhinitis due to pollen 09/24/2017   Spring and early Fall  . Small Bowel Obstruction (SBO) 01/13/2014   Ex-Lap & Lysis of Adhesion, 01/15/2014  . Tobacco use disorder 01/15/2014  . Type 2 diabetes mellitus with moderate nonproliferative diabetic retinopathy ([place]) 10/15/2013   Review of Systems:  ROS negative except as per HPI.  Physical Exam:  Vitals:   03/23/18 0824  BP: (!) 153/69  Pulse: (!) 55  SpO2: 93%  Weight: 208 lb 11.2 oz (94.7 kg)   Physical Exam: General: IN no acute distress, alert and oriented,  afebrile, nondiaphoretic, conversant, appears comfortable on room air.  Cardio: RRR, no murmur rubs or gallops Pulm: Lungs clear with no focal consolidations auscultated, no wheezing rhonchi or rails. GI: Soft, tender in the area of the moderate circumferential 9-10cm abscess, purulent  drainage exuding from the center of the abscess with notable induration and erythema present.  Assessment & Plan:   See Encounters Tab for problem based charting.  Patient seen with Dr. Beryle Beams

## 2018-03-23 NOTE — Telephone Encounter (Signed)
Steroid-Induced Hyperglycemia Prevention and Management Alicia [name] is a 62 y.o. female who meets criteria for [facility] glucose monitoring program (diabetes patient prescribed short course of steroids).  A/P Current Regimen  Patient prescribed prednisone 40 mg daily x 5 days, currently on day 0 or 1 of therapy. Patient taking prednisone in the AM  Prednisone indication: abscess  Current DM regimen 50 units lantus daily, 10 units humalog with breakfast & lunch, 15 units with dinner, metformin, bydureon, Celesta Gentile  PCP physician protocol level - not participating- level 3    Left patient a message cautioning her that steroids can increase blood sugar. Instructed her to check her blood sugar before  Meal sat last two time as day and to call office if blood sugars stay elevated above 200 mg/dl while on steroids  Follow-up next week in [place] 4:23 PM 03/23/2018

## 2018-03-23 NOTE — Assessment & Plan Note (Signed)
  Abdominal Abscess: Assessment: At insulin injection site on abdomin x 2 weeks with purulent discharge and pain. Similar in appearance to her most recent abscess on her Denied fever again. Site is worsening but pain decreased following rupture. Most concerning for abscess but given her abdominal surgery history I am also mildly concerned that this may represent a fistula and will ultrasound to better evaluate.   Plan: Ultrasound revealed a small 6x7cm circumferential loculated abscess with minimal fluid pocket. This was lanced with 1-15ml purulent discharge returned.  Course of doxycycline to double cover COPD and skin infection for MRSA given the nature recurrence of the abscess. No culture performed as I did not feel as if the lesion was extensive enough to limit resident skin bacterial contamination to yield a reliable result. Return in 6 days to determine resolution of symptoms.  Will perform repeat I&D with culture if persistent and place referral to surgery for more extensive I&D given the loculated nature of the abscess.  Cold compress PRN Ibuprofen for pain (last Cr 0.59)

## 2018-03-28 ENCOUNTER — Other Ambulatory Visit: Payer: Self-pay | Admitting: Cardiology

## 2018-03-29 ENCOUNTER — Encounter: Payer: Self-pay | Admitting: Internal Medicine

## 2018-03-29 ENCOUNTER — Other Ambulatory Visit: Payer: Self-pay

## 2018-03-29 ENCOUNTER — Ambulatory Visit (INDEPENDENT_AMBULATORY_CARE_PROVIDER_SITE_OTHER): Payer: Medicare Other | Admitting: Internal Medicine

## 2018-03-29 VITALS — BP 123/63 | HR 61 | Temp 98.1°F | Ht 67.0 in | Wt 206.9 lb

## 2018-03-29 DIAGNOSIS — J449 Chronic obstructive pulmonary disease, unspecified: Secondary | ICD-10-CM

## 2018-03-29 DIAGNOSIS — J441 Chronic obstructive pulmonary disease with (acute) exacerbation: Secondary | ICD-10-CM

## 2018-03-29 DIAGNOSIS — L02211 Cutaneous abscess of abdominal wall: Secondary | ICD-10-CM | POA: Diagnosis not present

## 2018-03-29 MED ORDER — DOXYCYCLINE HYCLATE 100 MG PO CAPS: 100.0000 mg | ORAL_CAPSULE | Freq: Two times a day (BID) | ORAL | 0 refills | Status: DC

## 2018-03-29 NOTE — Patient Instructions (Addendum)
FOLLOW-UP INSTRUCTIONS When: on 05/13-14  For: evaluation of the abscess What to bring: Yourself  Please be sure to return for your visit in one week and to continue to take your antibiotics times daily. He developed fever, chills, myalgias, vomiting, diarrhea, sweating, or other concerning symptoms please notify us immediately.  I have refilled your prescription for doxycycline to be continued for additional 7 days. You may continue to use cold compresses, ibuprofen or Tylenol for pain.  Thank you for your visit to [facility] today.

## 2018-03-29 NOTE — Assessment & Plan Note (Signed)
COPD exacerbation: The patient's pulmonary status is greatly improved from his last visit.  She did not denies shortness of breath, chest pain, worsening cough.  Cough is greatly improved but she continues to produce sputum on occasion.  She again denies fever, chills, worsening cough, chest pain, or other concerning symptoms.  The patient was advised to return if she develops shortness of breath, worsening cough, fever,.

## 2018-03-29 NOTE — Assessment & Plan Note (Signed)
Abdominal abscess: The patient's abscess was observed to be approximately 6 x 7 cm and circumferential in nature that was loculated on her prior exam.  The abscess was incised and drained on 03/23/2018 the patient was given a prescription for doxycycline 100mg  BID x 5 days to double cover for possible MRSA skin infection and her COPD exacerbation.  No culture was performed at that time as it did not appear to be extensive enough to effectively exclude resident skin bacterial contamination and thus to reveal a reliable result. The patient was advised to utilize ibuprofen for pain control and cold compresses as needed. Patient denied fever, chills, nausea, vomiting, diarrhea constant, constipation, myalgias, swelling or erythema at the site of her abscess, new abscesses, or other concerning symptoms. The abscess has greatly improved today and is much smaller down to 2-3 cm today. There is however an area of central ulceration without prominent discharge but purulence present of approximately 1cm ulceration. Given the persistence of the abscess and purulence I feel it appropriate to continue antibiotics and have the patient follow-up in one week given her history of multiple abscesses and active diabetes.   Plan: Abscess appeared improved Continue doxycycline x 7 days.  Follow-up in 7 days in our clinic Continue cold compresses as needed Cover the wound and remove bandages daily

## 2018-03-29 NOTE — Progress Notes (Signed)
CC: Abscess   HPI:[name] is a 62 y.o. female who presents to the [facility] today for evaluation of her abscess.  Abdominal abscess: The patient's abscess was observed to be approximately 6 x 7 cm and circumferential in nature that was loculated on her prior exam.  The abscess was incised and drained on 03/23/2018 the patient was given a prescription for doxycycline 100mg  BID x 5 days to double cover for possible MRSA skin infection and her COPD exacerbation.  No culture was performed at that time as it did not appear to be extensive enough to effectively exclude resident skin bacterial contamination and thus to reveal a reliable result. The patient was advised to utilize ibuprofen for pain control and cold compresses as needed. Patient denied fever, chills, nausea, vomiting, diarrhea constant, constipation, myalgias, swelling or erythema at the site of her abscess, new abscesses, or other concerning symptoms. The abscess has greatly improved today and is much smaller down to 2-3 cm today. There is however an area of central ulceration without prominent discharge but purulence present of approximately 1cm ulceration. Given the persistence of the abscess and purulence I feel it appropriate to continue antibiotics and have the patient follow-up in one week given her history of multiple abscesses and active diabetes.   Plan: Abscess appeared improved Continue doxycycline x 7 days.  Follow-up in 7 days in our clinic Continue cold compresses as needed Cover the wound and remove bandages daily  COPD exacerbation: The patient's pulmonary status is greatly improved from his last visit.  She did not denies shortness of breath, chest pain, worsening cough.  Cough is greatly improved but she continues to produce sputum on occasion.  She again denies fever, chills, worsening cough, chest pain, or other concerning symptoms.  The patient was advised to return if she develops shortness of breath, worsening  cough, fever,.  Past Medical History:  Diagnosis Date  . Acquired lactose intolerance 09/24/2017  . 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  Non-functioning  . Aortic atherosclerosis ([place]) 09/24/2017   Asymptomatic, found on CT scan  . Blood transfusion without reported diagnosis   . Chronic Systolic Heart Failure 11/28/2692   Felt to be non-ischemic and secondary to hypertension.  Echo (05/28/2014): LVEF 25%.  Is not interested in AICD placement.  . Coronary artery disease involving native coronary artery of native heart without angina pectoris 11/09/2014   Cardiac cath (02/18/2014): Non-obstructive, mRCA 30%, dRCA 60%  . 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 Diverticulosis of colon 09/24/2017  . Essential hypertension 10/15/2013  . Gastroesophageal reflux disease 09/24/2017  . History of cerebrovascular accident 05/10/2013   Per patient report 6 previous strokes, most recent on 05/10/13.  No residual deficits.  . Hyperlipidemia   . Overweight (BMI 25.0-29.9) 09/24/2017  . Psoriasis 09/24/2017  . Seasonal allergic rhinitis due to pollen 09/24/2017   Spring and early Fall  . Small Bowel Obstruction (SBO) 01/13/2014   Ex-Lap & Lysis of Adhesion, 01/15/2014  . Tobacco use disorder 01/15/2014  . Type 2 diabetes mellitus with moderate nonproliferative diabetic retinopathy ([place]) 10/15/2013   Review of Systems: ROS negative except as per HPI  Physical Exam:  Vitals:   03/29/18 0838  BP: 123/63  Pulse: 61  Temp: 98.1 F (36.7 C)  TempSrc: Oral  SpO2: 94%  Weight: 206 lb 14.4 oz (93.8 kg)  Height:  5\' 7"  (1.702 m)   General: Alert and oriented, no acute distress, conversant, afebrile, nondiaphoretic, Cardio: RRR no murmurs rubs or gallops auscultated Pulmonary: Lungs clear to auscultation bilaterally with upper airway congestion auscultated GI: Abdomen is soft, tender to palpation  only in the area of the abscess, with normal bowel sounds. Musc: bilateral lower extremities are nontender nonedematous   Assessment & Plan:   See Encounters Tab for problem based charting.  Patient seen with Dr. Angelia Mould

## 2018-03-30 NOTE — Progress Notes (Signed)
Internal Medicine Clinic Attending  I saw and evaluated the patient.  I personally confirmed the key portions of the history and exam documented by Dr. Berline Lopes and I reviewed pertinent patient test results.  The assessment, diagnosis, and plan were formulated together and I agree with the documentation in the resident's note. As noted Abscess is markedly improved, still has area of induration about 2cm in size.  Incision site does have some ulceration I was not able to express any purulence however given previous purulence on I&D there is some clincial concern for MRSA (no swab obtained during I&D), we discussed repeat I&D versus continued ABx, patient decided on later, will have her follow up next week.

## 2018-04-04 ENCOUNTER — Encounter: Payer: Self-pay | Admitting: Internal Medicine

## 2018-04-04 ENCOUNTER — Ambulatory Visit: Payer: Medicare Other

## 2018-04-07 ENCOUNTER — Encounter: Payer: Self-pay | Admitting: *Deleted

## 2018-04-26 ENCOUNTER — Encounter: Payer: Self-pay | Admitting: Cardiology

## 2018-04-26 ENCOUNTER — Ambulatory Visit (INDEPENDENT_AMBULATORY_CARE_PROVIDER_SITE_OTHER): Payer: Medicare Other | Admitting: Cardiology

## 2018-04-26 VITALS — BP 128/64 | HR 57 | Ht 67.0 in | Wt 212.4 lb

## 2018-04-26 DIAGNOSIS — I10 Essential (primary) hypertension: Secondary | ICD-10-CM

## 2018-04-26 DIAGNOSIS — I5023 Acute on chronic systolic (congestive) heart failure: Secondary | ICD-10-CM

## 2018-04-26 DIAGNOSIS — Z79899 Other long term (current) drug therapy: Secondary | ICD-10-CM

## 2018-04-26 DIAGNOSIS — I2510 Atherosclerotic heart disease of native coronary artery without angina pectoris: Secondary | ICD-10-CM

## 2018-04-26 MED ORDER — SACUBITRIL-VALSARTAN 24-26 MG PO TABS: 1.0000 | ORAL_TABLET | Freq: Two times a day (BID) | ORAL | 6 refills | Status: DC

## 2018-04-26 MED ORDER — FUROSEMIDE 40 MG PO TABS: 40.0000 mg | ORAL_TABLET | Freq: Two times a day (BID) | ORAL | 3 refills | Status: DC

## 2018-04-26 NOTE — Patient Instructions (Signed)
Medication Instructions:  Please stop Lisinopril.  Start Entresto 24-26 mg twice a day 36 hours after your last dose of Lisinopril.  Increase Furosemide to 40 mg twice a day. Continue all other medications as listed.  Labwork: Please have blood work in 1 week (BMP)  Follow-Up: Follow up as scheduled.  If you need a refill on your cardiac medications before your next appointment, please call your pharmacy.  Thank you for choosing [facility]!!

## 2018-04-26 NOTE — Progress Notes (Signed)
[facility]. [address] [place], [place]  [address] Phone: [phone] Fax:  [phone]  Date:  04/26/2018   ID:  Alicia [name], DOB 06/10/1956, MRN [id number]  PCP:  Oval Linsey, MD   History of Present Illness: Alicia [name] is a 62 y.o. female with a history of chronic systolic heart failure (NICM) ejection fraction ranging from 25-40%, nuclear stress test with no ischemia, diabetes, hypertension, prior stroke here for followup.   No syncope, no retention of fluid. No chest pain.  Coreg BID.   At prior visit noted breathing up stairs (daughter thought it was horror move character [name] previously).  Her grandson also has skin condition that she has, hereditary with dark patches.  No chest pain, no shortness of breath, no syncope. She enjoyed a trip to [place]. This was the first time she was on an airplane. She left the day after the [place] here in [place].  10/19/17-she had a hospitalization in September with acute cholecystectomy performed, Klebsiella, Enterobacter, white count.  Overall she has been doing quite well.  Has some soreness with her laparoscopy site.  Denies any chest pain, syncope, bleeding, orthopnea.  No issues with heart failure.  She enjoyed seeing Dr. Eppie Gibson.  04/26/2018 - quit smoking, however, she has recently been more short of breath, PND, orthopnea noted.  Pedal edema noted.  She may have a degree of acute systolic heart failure present.  I will go ahead and increase her Lasix, start Entresto, see below.  Close follow-up.  No chest pain.   Studies:   - LHC (02/19/14): Mid RCA 30-40, distal RCA 60, EF 25%.  - Echo (05/11/13): Moderate LVH, EF 30-40%.  - Nuclear (07/23/09 - in [place]): No ischemia.  - Holter (03/2012): NSR, occasional PVCs, no significant arrhythmia  Recent Labs:  02/18/2014: Pro B Natriuretic peptide (BNP) 2876.0*; TSH 1.032  02/20/2014: Hemoglobin 10.9*  03/27/2014: ALT 13; HDL Cholesterol by NMR  49.50; LDL (calc) 135*  04/02/2014: Creatinine 0.5; Potassium 3.8      Wt Readings from Last 3 Encounters:  04/26/18 212 lb 6.4 oz (96.3 kg)  03/29/18 206 lb 14.4 oz (93.8 kg)  03/23/18 208 lb 11.2 oz (94.7 kg)     Past Medical History:  Diagnosis Date  . Acquired lactose intolerance 09/24/2017  . 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  Non-functioning  . Aortic atherosclerosis ([place]) 09/24/2017   Asymptomatic, found on CT scan  . Blood transfusion without reported diagnosis   . Chronic Systolic Heart Failure 6/38/4665   Felt to be non-ischemic and secondary to hypertension.  Echo (05/28/2014): LVEF 25%.  Is not interested in AICD placement.  Marland Kitchen COPD exacerbation ([place])   . Coronary artery disease involving native coronary artery of native heart without angina pectoris 11/09/2014   Cardiac cath (02/18/2014): Non-obstructive, mRCA 30%, dRCA 60%  . 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 Diverticulosis of colon 09/24/2017  . Essential hypertension 10/15/2013  . Gastroesophageal reflux disease 09/24/2017  . History of cerebrovascular accident 05/10/2013   Per patient report 6 previous strokes, most recent on 05/10/13.  No residual deficits.  . Hyperlipidemia   . Overweight (BMI 25.0-29.9) 09/24/2017  . Psoriasis 09/24/2017  . Seasonal allergic rhinitis due to pollen 09/24/2017   Spring and early Fall  . Small Bowel Obstruction (SBO) 01/13/2014  Ex-Lap & Lysis of Adhesion, 01/15/2014  . Tobacco use disorder 01/15/2014  . Type 2 diabetes mellitus with moderate nonproliferative diabetic retinopathy ([place]) 10/15/2013    Past Surgical History:  Procedure Laterality Date  . CHOLECYSTECTOMY N/A 08/02/2017   Procedure: LAPAROSCOPIC CHOLECYSTECTOMY;  Surgeon: Erroll Luna, MD;  Location: [facility];  Service: General;  Laterality: N/A;  . LAPAROTOMY N/A 01/15/2014   Procedure: Exploratory  Laparotomy & Small Bowel Resection  Surgeon: Imogene Burn. Georgette Dover, MD  Location: [facility]  . LEFT HEART CATHETERIZATION WITH CORONARY ANGIOGRAM N/A 02/19/2014   Procedure: LEFT HEART CATHETERIZATION WITH CORONARY ANGIOGRAM;  Surgeon: Troy Sine, MD;  Location: [facility] CATH LAB;  Service: Cardiovascular;  Laterality: N/A;  . LYSIS OF ADHESION N/A 01/15/2014   Procedure: LYSIS OF ADHESION;  Surgeon: Imogene Burn. Georgette Dover, MD;  Location: [facility];  Service: General;  Laterality: N/A;  . SMALL INTESTINE SURGERY    . TUBAL LIGATION    . VENTRAL HERNIA REPAIR N/A 12/2013   Prior ventral hernia repair- strangulation. 2/15    Current Outpatient Medications  Medication Sig Dispense Refill  . amLODipine (NORVASC) 10 MG tablet Take 1 tablet (10 mg total) by mouth daily. 90 tablet 3  . aspirin 81 MG tablet Take 81 mg by mouth daily.    Marland Kitchen atorvastatin (LIPITOR) 40 MG tablet TAKE 1 TABLET BY MOUTH EVERY DAY 90 tablet 3  . BYDUREON 2 MG SRER Inject 1 mL into the skin once a week. Saturdays     . carvedilol (COREG) 12.5 MG tablet TAKE 1 TABLET BY MOUTH 2 TIMES DAILY. 180 tablet 2  . furosemide (LASIX) 40 MG tablet Take 1 tablet (40 mg total) by mouth 2 (two) times daily. 180 tablet 3  . glucose blood (ACCU-CHEK AVIVA PLUS) test strip USE TO TEST BLOOD SUGARS AS DIRECTED BY PHYSICIAN 100 each 12  . Insulin Glargine (LANTUS) 100 UNIT/ML Solostar Pen Inject 50 Units into the skin daily at 10 pm. 2 pen 3  . insulin lispro (HUMALOG KWIKPEN) 100 UNIT/ML KiwkPen Use 10 units before breakfast and lunch and 15 units before dinner. 45 mL 11  . Insulin Pen Needle 32G X 4 MM MISC Use to inject insulin 4 times a day. The patient is insulin requiring, ICD 10 code 11.10. The patient injects 4 times per day. 100 each 3  . JANUVIA 100 MG tablet Take 100 mg by mouth daily.    . metFORMIN (GLUCOPHAGE-XR) 500 MG 24 hr tablet Take 1,000 mg by mouth 2 (two) times daily.    . TRUEPLUS LANCETS 28G MISC USE TO TEST BLOOD SUGARS AS DIRECTED BY  PHYSICIAN 100 each 12  . sacubitril-valsartan (ENTRESTO) 24-26 MG Take 1 tablet by mouth 2 (two) times daily. 60 tablet 6   No current facility-administered medications for this visit.     Allergies:    No Known Allergies  Social History:  The patient  reports that she has been smoking cigarettes.  She has a 18.75 pack-year smoking history. She has never used smokeless tobacco. She reports that she does not drink alcohol or use drugs.   Family History  Problem Relation Age of Onset  . Diabetes Mellitus II Mother   . Hypertension Mother   . Cerebrovascular Accident Mother 64       Massive, resulted in death  . Heart failure Brother   . Obesity Brother   . Pulmonary embolism Father 49       Resulted in sudden death  . Coronary  artery disease Brother   . Obesity Brother   . Diabetes Mellitus II Brother   . Healthy Brother   . Healthy Brother   . Obesity Son   . Healthy Son   . Healthy Daughter     ROS:  Please see the history of present illness.  All other review of systems negative  PHYSICAL EXAM: VS:  BP 128/64   Pulse (!) 57   Ht 5\' 7"  (1.702 m)   Wt 212 lb 6.4 oz (96.3 kg)   LMP  (LMP Unknown)   SpO2 (!) 83%   BMI 33.27 kg/m  GEN: Well nourished, well developed, in no acute distress  HEENT: normal  Neck: no JVD, carotid bruits, or masses Cardiac: RRR; no murmurs, rubs, or gallops, pedal edema  Respiratory:  clear to auscultation bilaterally, normal work of breathing GI: soft, nontender, nondistended, + BS MS: no deformity or atrophy  Skin: warm and dry, no rash Neuro:  Alert and Oriented x 3, Strength and sensation are intact Psych: euthymic mood, full affect     EKG:   EKG 10/06/16 HR 100, -sinus rhythm, no pacing seen, right atrial enlargement, old septal infarct, nonspecific T-wave changes. Prior 09/30/15-sinus rhythm, 62, poor R-wave progression, T-wave inversion lateral leads, left anterior fascicular block. Personally viewed     ECHO 05/28/14: - Left  ventricle: The cavity size was normal. There was moderate concentric hypertrophy. Systolic function was severely reduced. The estimated ejection fraction was in the range of 20% to 25%. There is hypokinesis of the inferior myocardium. - Mitral valve: There was mild regurgitation. - Left atrium: The atrium was mildly dilated.  ASSESSMENT AND PLAN:    1. Acute on chronic systolic heart failure-nonischemic -she is currently having more shortness of breath, PND-like symptoms, cough.  This may be pulmonary related however could certainly be related to excessive fluid.  Pedal edema is noted on exam.  Weight is up approximately 6 pounds.  I will increase her Lasix to 40 mg twice a day.  I will also start her on Entresto 24/26.  Hold lisinopril for 36 hours prior to administration.  We will check a basic metabolic profile in 1 week.  Previously had discussion, no ICD. Marland Kitchen 2. Hypertension-controlled today.  Medications reviewed.  May see reduction in blood pressure with Entresto 3. Hyperlipidemia-atorvastatin 40. LDL 90 less than 100. Overall doing quite well.  No changes made. 4. Coronary artery disease-nonobstructive disease. Continue with secondary prevention. Statin. No myalgias.  Nonischemic cardiomyopathy  5. prior ventral hernia repair- strangulation. 2/15, stable.  No new issues 6. Prior cholecystectomy-had white count of 18,000, Enterobacter, Klebsiella, antibiotics, cholecystectomy in September 2018.  No new issues 7. Diabetes-hemoglobin A1c 13 at one point, now much better, 9.6-working with Dr. Eppie Gibson 8. Tobacco use -good job with tobacco cessation. 9. 6 month followup    Signed, Candee Furbish, MD [facility]  04/26/2018 11:12 AM

## 2018-04-28 ENCOUNTER — Other Ambulatory Visit: Payer: Self-pay | Admitting: *Deleted

## 2018-04-28 ENCOUNTER — Other Ambulatory Visit: Payer: Self-pay | Admitting: Internal Medicine

## 2018-04-28 DIAGNOSIS — Z794 Long term (current) use of insulin: Principal | ICD-10-CM

## 2018-04-28 DIAGNOSIS — E1110 Type 2 diabetes mellitus with ketoacidosis without coma: Secondary | ICD-10-CM

## 2018-04-28 MED ORDER — INSULIN PEN NEEDLE 32G X 4 MM MISC: 3 refills | Status: DC

## 2018-04-28 NOTE — Telephone Encounter (Signed)
Request sent 

## 2018-04-28 NOTE — Telephone Encounter (Signed)
NEEDS REFILL ON PEN NEEDLES, [facility] ON [address]

## 2018-05-05 ENCOUNTER — Other Ambulatory Visit: Payer: Medicare Other | Admitting: *Deleted

## 2018-05-05 DIAGNOSIS — Z79899 Other long term (current) drug therapy: Secondary | ICD-10-CM

## 2018-05-05 LAB — BASIC METABOLIC PANEL
BUN/Creatinine Ratio: 17 (ref 12–28)
BUN: 10 mg/dL (ref 8–27)
CALCIUM: 9.5 mg/dL (ref 8.7–10.3)
CHLORIDE: 92 mmol/L — AB (ref 96–106)
CO2: 30 mmol/L — ABNORMAL HIGH (ref 20–29)
CREATININE: 0.59 mg/dL (ref 0.57–1.00)
GFR calc non Af Amer: 100 mL/min/{1.73_m2} (ref >59)
GFR, EST AFRICAN AMERICAN: 115 mL/min/{1.73_m2} (ref >59)
Glucose: 280 mg/dL — ABNORMAL HIGH (ref 65–99)
Potassium: 4.2 mmol/L (ref 3.5–5.2)
Sodium: 136 mmol/L (ref 134–144)

## 2018-05-06 ENCOUNTER — Ambulatory Visit: Payer: Medicare Other | Admitting: Dietician

## 2018-05-06 ENCOUNTER — Ambulatory Visit (INDEPENDENT_AMBULATORY_CARE_PROVIDER_SITE_OTHER): Payer: Medicare Other | Admitting: Internal Medicine

## 2018-05-06 ENCOUNTER — Encounter: Payer: Self-pay | Admitting: Dietician

## 2018-05-06 ENCOUNTER — Encounter: Payer: Self-pay | Admitting: Internal Medicine

## 2018-05-06 VITALS — BP 139/68 | HR 56 | Temp 98.2°F | Wt 210.3 lb

## 2018-05-06 DIAGNOSIS — I700 Atherosclerosis of aorta: Secondary | ICD-10-CM | POA: Diagnosis not present

## 2018-05-06 DIAGNOSIS — E113393 Type 2 diabetes mellitus with moderate nonproliferative diabetic retinopathy without macular edema, bilateral: Secondary | ICD-10-CM

## 2018-05-06 DIAGNOSIS — Z0000 Encounter for general adult medical examination without abnormal findings: Secondary | ICD-10-CM | POA: Diagnosis not present

## 2018-05-06 DIAGNOSIS — I10 Essential (primary) hypertension: Secondary | ICD-10-CM | POA: Diagnosis not present

## 2018-05-06 DIAGNOSIS — E7800 Pure hypercholesterolemia, unspecified: Secondary | ICD-10-CM | POA: Diagnosis not present

## 2018-05-06 DIAGNOSIS — R3989 Other symptoms and signs involving the genitourinary system: Secondary | ICD-10-CM | POA: Diagnosis not present

## 2018-05-06 DIAGNOSIS — L409 Psoriasis, unspecified: Secondary | ICD-10-CM | POA: Diagnosis not present

## 2018-05-06 DIAGNOSIS — I2510 Atherosclerotic heart disease of native coronary artery without angina pectoris: Secondary | ICD-10-CM

## 2018-05-06 DIAGNOSIS — Z794 Long term (current) use of insulin: Secondary | ICD-10-CM

## 2018-05-06 DIAGNOSIS — I5022 Chronic systolic (congestive) heart failure: Secondary | ICD-10-CM | POA: Diagnosis not present

## 2018-05-06 DIAGNOSIS — F17200 Nicotine dependence, unspecified, uncomplicated: Secondary | ICD-10-CM

## 2018-05-06 LAB — POCT GLYCOSYLATED HEMOGLOBIN (HGB A1C): Hemoglobin A1C: 10.9 % — AB (ref 4.0–5.6)

## 2018-05-06 LAB — GLUCOSE, CAPILLARY: Glucose-Capillary: 188 mg/dL — ABNORMAL HIGH (ref 65–99)

## 2018-05-06 NOTE — Progress Notes (Signed)
10.9 

## 2018-05-06 NOTE — Assessment & Plan Note (Signed)
Assessment  She denies any signs or symptoms consistent with symptomatic aortic atherosclerosis, including a lack of claudication.  Plan  We will continue to aggressively treat her cardiovascular risk factors including her hypertension, hyperlipidemia, and diabetes. The fact that she has quit smoking is a major gain in avoiding symptomatic aortic atherosclerotic disease.

## 2018-05-06 NOTE — Assessment & Plan Note (Signed)
Assessment  Workup of her pneumaturia revealed no evidence of a colonic vesicular fistula. There was no thickening of either the colonic wall or the bladder wall. She was found to have a urinary tract infection which was treated with antibiotics. She states since then she's had no more pneumaturia.  Plan  The likely cause of the pneumaturia was a urinary tract infection in this patient with poorly controlled diabetes. I do not believe, given the evaluation, that she has a colovesicular fistula. No further evaluation is necessary at this time. Where she to have a recurrence of her pneumaturia we should first consider the possibility of a recurrent urinary tract infection.

## 2018-05-06 NOTE — Assessment & Plan Note (Signed)
Assessment  She is tolerating her atorvastatin 40 mg by mouth daily without myalgias.  Plan  We will continue with this high intensity statin and reassess for intolerances at the follow-up visit.

## 2018-05-06 NOTE — Assessment & Plan Note (Signed)
At the follow-up visit we will discuss the various methods of colon cancer screening. She is otherwise up-to-date on her health care maintenance.

## 2018-05-06 NOTE — Assessment & Plan Note (Signed)
Assessment  Her diabetes remains poorly controlled with hemoglobin A1c of 10.9.  This is on Lantus 50 units subcutaneous each evening NovoLog 10 units before breakfast and lunch and 15 units before dinner, exenatide 2 mg subcutaneous every Saturday, metformin 1000 mg by mouth twice daily, and Januvia 100 mg by mouth daily.unfortunately, she will have occasional lows where she develops anxiety, palpitations, and a sense of doom. She states she feels awful with these and then will ingest carbohydrate. I am concerned about these symptomatic lows and likely overcorrection. She also admits to drinking at least 4 non-diet colas per day.  Plan  We had a long discussion about the importance of avoiding non-diet colas. I recommended that she eliminate these altogether which she states she will. She will convert to water. With regards to her hypoglycemic episodes, we will obtain continuous glucose monitoring and make further adjustments as dictated by the results. Ms. Plyler initiated this today. In the meantime, we will continue with her current diabetes regimen awaiting the results of the continuous glucose monitoring. We will also reassess the long-term control of her diabetes with a repeat hemoglobin A1c in 3 months after adjustments have been made.

## 2018-05-06 NOTE — Assessment & Plan Note (Signed)
Assessment  She was recently seen by Dr. Marlou Porch, her cardiologist, who felt that her recent weight gain, orthopnea, paroxysmal nocturnal dyspnea, and lower extremity swelling were related to an episode of acute on chronic systolic heart failure. At that visit he change the Lasix to twice daily, stop the lisinopril, and started Entresto. Since that time she states she is feeling much improved from a breathing standpoint with no further orthopnea or paroxysmal nocturnal dyspnea. In addition, she feels her lower extremity edema has improved.  Plan  We will continue the Entresto, Lasix 40 mg twice daily, and carvedilol 12.5 mg by mouth twice daily. We will reassess the efficacy of this regimen and maintaining control of her chronic systolic heart failure at the follow-up visit.

## 2018-05-06 NOTE — Progress Notes (Signed)
   Subjective:    Patient ID: Alicia [name], female    DOB: 08-Mar-1956, 62 y.o.   MRN: [id number]  HPI  Wetona Viramontes Salvetti is here for follow-up of her type 2 diabetes with retinopathy, chronic systolic heart failure, essential hypertension, hyperlipidemia, coronary artery disease, aortic atherosclerosis, psoriasis, and pneumaturia. Please see the A&P for the status of the pt's chronic medical problems.  Review of Systems  Constitutional: Positive for fatigue. Negative for activity change, appetite change and unexpected weight change.  Respiratory: Positive for cough. Negative for apnea, chest tightness, shortness of breath and wheezing.   Cardiovascular: Positive for leg swelling. Negative for chest pain and palpitations.  Gastrointestinal: Negative for abdominal pain, constipation, diarrhea, rectal pain and vomiting.  Genitourinary: Negative for difficulty urinating, dysuria and flank pain.  Musculoskeletal: Negative for myalgias.  Skin: Positive for rash.  Neurological: Negative for dizziness and weakness.  Psychiatric/Behavioral: Negative for sleep disturbance.      Objective:   Physical Exam  Constitutional: She appears well-developed and well-nourished. No distress.  HENT:  Head: Normocephalic and atraumatic.  Eyes: Right eye exhibits no discharge. Left eye exhibits no discharge. No scleral icterus.  Cardiovascular: Normal rate, regular rhythm and normal heart sounds. Exam reveals no gallop and no friction rub.  No murmur heard. Pulmonary/Chest: Effort normal. No stridor. No respiratory distress. She has no wheezes. She has rales.  Rales only appreciated anteriorly  Abdominal: Soft. Bowel sounds are normal. She exhibits no distension. There is no tenderness. There is no rebound and no guarding.  Musculoskeletal: Normal range of motion. She exhibits edema. She exhibits no tenderness or deformity.  Neurological: She exhibits normal muscle tone.  Skin: Skin is warm and dry. Rash  noted. She is not diaphoretic. No erythema.  Psoriatic plaques on lower extremity, unchanged  Psychiatric: She has a normal mood and affect. Her behavior is normal. Judgment and thought content normal.  Nursing note and vitals reviewed.     Assessment & Plan:   Please see problem oriented charting.

## 2018-05-06 NOTE — Assessment & Plan Note (Addendum)
Assessment  Her psoriatic plaques are stable despite the chronic beta blocker therapy. She also did not have a flare when receiving prednisone for a presumed COPD exacerbation.  Plan  We will continue to clinically monitor her psoriasis.

## 2018-05-06 NOTE — Assessment & Plan Note (Signed)
Assessment  Her blood pressure today is at target at 139/68. This is on amlodipine 10 mg by mouth daily, carvedilol 12.5 mg by mouth twice daily, furosemide 40 mg by mouth twice daily, and Entresto 24-26 mg 1 tablet twice daily.  Plan  We will continue her current anti-hypertensive regimen without changes. We will reassess her blood pressure control at the follow-up visit.

## 2018-05-06 NOTE — Assessment & Plan Note (Signed)
Assessment  She denies any episodes of chest pain on the carvedilol 12.5 mg by mouth twice daily, amlodipine 10 mg by mouth daily, atorvastatin 40 mg by mouth daily, and aspirin 81 mg by mouth daily.  Plan  We will continue the carvedilol, amlodipine, atorvastatin, and aspirin at the current doses. We will reassess for evidence of symptomatic angina at the follow-up visit. We will also continue to aggressively treat cardiovascular risk factors, including her hypertension, hyperlipidemia, and diabetes. I am pleased she has quit smoking.

## 2018-05-06 NOTE — Assessment & Plan Note (Signed)
Assessment  Alicia [name] has quit smoking for the last 2 weeks.  Plan  She was praised on this significant accomplishment given her underlying chronic medical conditions. She was encouraged to continue to maintain her abstinence from tobacco. Her granddaughter was in the room and also mentioned that her family had bought  Ms. Allnutt a squeeze ball for any stressful event so she does not reach for cigarettes again. We will reassess her success at remaining off of tobacco at the follow-up visit.

## 2018-05-06 NOTE — Patient Instructions (Addendum)
It was great to see you again.  Great job quitting smoking!  1) Keep taking the medications as you are.  2) Stop drinking those sodas.  I bet your sugars and weight get better by stopping.  3) We will set you up for continuous glucose monitoring to see if we can better adjust your insulin so you do not have the highs and lows.  4) Great job with quitting smoking!    I will see you back in 3 months, sooner if necessary.  Please return in 1 week to see [name] and a doctor to follow up on your continuous glucose readings.

## 2018-05-06 NOTE — Progress Notes (Signed)
Discussed patient care with Dr. Eppie Gibson. He and patient decided to have professional  CGM placed today due to concern for low blood sugars.  Freestyle Libre Pro CGM sensor placed and started. Patient was educated about wearing sensor, keeping food, activity and medication log and when to call office. She verbalized understanding to the education and instructions. Follow up was arranged with the patient.  Debera Lat, RD 05/06/2018 11:46 AM.

## 2018-05-12 ENCOUNTER — Ambulatory Visit (INDEPENDENT_AMBULATORY_CARE_PROVIDER_SITE_OTHER): Payer: Medicare Other | Admitting: Dietician

## 2018-05-12 ENCOUNTER — Other Ambulatory Visit: Payer: Self-pay

## 2018-05-12 ENCOUNTER — Encounter: Payer: Self-pay | Admitting: Dietician

## 2018-05-12 ENCOUNTER — Ambulatory Visit (INDEPENDENT_AMBULATORY_CARE_PROVIDER_SITE_OTHER): Payer: Medicare Other | Admitting: Internal Medicine

## 2018-05-12 ENCOUNTER — Encounter: Payer: Self-pay | Admitting: Internal Medicine

## 2018-05-12 DIAGNOSIS — E113399 Type 2 diabetes mellitus with moderate nonproliferative diabetic retinopathy without macular edema, unspecified eye: Secondary | ICD-10-CM

## 2018-05-12 DIAGNOSIS — Z794 Long term (current) use of insulin: Secondary | ICD-10-CM | POA: Diagnosis not present

## 2018-05-12 DIAGNOSIS — E113393 Type 2 diabetes mellitus with moderate nonproliferative diabetic retinopathy without macular edema, bilateral: Secondary | ICD-10-CM | POA: Diagnosis not present

## 2018-05-12 DIAGNOSIS — Z713 Dietary counseling and surveillance: Secondary | ICD-10-CM

## 2018-05-12 NOTE — Patient Instructions (Addendum)
Alicia [name]- I look forward to talking to you more next week. You are doing a great job taking care of your diabetes!  Alicia [name] [phone]  A person needs to inject insulin into the layer of fat directly under the skin, known as subcutaneous tissue, with a small needle or a device that looks like a pen.  Several different sites can support an insulin injection.

## 2018-05-12 NOTE — Progress Notes (Signed)
   CC: Continuous glucose monitor reading  HPI:  Alicia [name] is a 62 y.o. female with a past medical history listed below here today for interpretation of her continuous glucose monitor.  For details of today's visit and the status of her chronic medical issues please refer to the assessment and plan.   Past Medical History:  Diagnosis Date  . Acquired lactose intolerance 09/24/2017  . 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  Non-functioning  . Aortic atherosclerosis ([place]) 09/24/2017   Asymptomatic, found on CT scan  . Blood transfusion without reported diagnosis   . Chronic Systolic Heart Failure 5/88/3254   Felt to be non-ischemic and secondary to hypertension.  Echo (05/28/2014): LVEF 25%.  Is not interested in AICD placement.  Marland Kitchen COPD exacerbation ([place])   . Coronary artery disease involving native coronary artery of native heart without angina pectoris 11/09/2014   Cardiac cath (02/18/2014): Non-obstructive, mRCA 30%, dRCA 60%  . 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 Diverticulosis of colon 09/24/2017  . Essential hypertension 10/15/2013  . Gastroesophageal reflux disease 09/24/2017  . History of cerebrovascular accident 05/10/2013   Per patient report 6 previous strokes, most recent on 05/10/13.  No residual deficits.  . Hyperlipidemia   . Overweight (BMI 25.0-29.9) 09/24/2017  . Psoriasis 09/24/2017  . Seasonal allergic rhinitis due to pollen 09/24/2017   Spring and early Fall  . Small Bowel Obstruction (SBO) 01/13/2014   Ex-Lap & Lysis of Adhesion, 01/15/2014  . Tobacco use disorder 01/15/2014  . 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 CARDIAC- RRR, no murmurs, rubs or gallops. RESP- Cear to auscultation bilaterally, no wheezes or crackles. ABDOMEN- Soft, nontender, bowel sounds present. PSYCH- Normal mood and affect, appropriate thought content and speech.   Assessment & Plan:   See Encounters Tab for problem based charting.  Patient discussed with Dr. Beryle Beams

## 2018-05-12 NOTE — Assessment & Plan Note (Addendum)
Lab Results  Component Value Date   HGBA1C 10.9 (A) 05/06/2018   HGBA1C 9.6 02/04/2018   HGBA1C 11.0 (H) 08/03/2017    Patient returns today for follow-up of her diabetes to have her continuous glucose monitor interpreted.  She was seen by her primary care physician on 05/06/2018.  She reported that time that she was having occasional lows with anxiety, palpitation, sense of doom.  She is on an extensive regimen including 50 units Lantus nightly, NovoLog 10 units before breakfast and lunch and 15 units before dinner, exantide 2 mg weekly, metformin 1000 mg twice daily, Januvia 100 mg daily.  Dayanara Sherrill Wank wore the CGM for 7 days. The average reading was 247, % time in target was 25, % time below target was 0, and % time above target was. 75.   Assessment and plan: Upon review of her continuous glucose monitor she is staying persistently elevated above 180.  She has spikes in her CBGs after breakfast and after lunch without any spikes after dinner.  She remained elevated overnight for most of the nights however the past 2 nights she did drop into the 80s.  She reports no changes in any of her medications or diet that could account for these lows.  Given her mealtime spikes going to increase her breakfast and lunch NovoLog doses to 14 units.  I am also recommending changing her Lantus dose from evening to mornings.  She will continue to wear the monitor for another week and return for follow-up next week.  On review of her medication list she is on both a GLP-1 and a DPP 4.  As they both have similar mechanisms of action it may be prudent to discontinue the DPP 4.  There is only one study that showed a small additional efficacy of combining these 2 medications (reduce the A1c by 0.3%).  There are also some safety concerns and the increased risk of pancreatitis as well as cost considerations and polypharmacy considerations.  I see no obvious contraindications to SGLT2 in this patient and this could be  another consideration to help better control her diabetes.  I will discuss with her PCP before making any changes.

## 2018-05-12 NOTE — Patient Instructions (Signed)
Alicia [name],  I want to make a couple changes today.  Start taking your Lantus 50 units in the mornings instead of at night.  Also want to increase your NovoLog with breakfast and lunch to 14 units.  Continue taking 16 units of NovoLog with dinner.  Continue to take your mealtime insulin before meals as that is the way it is effective.  Please follow-up with [name] next week for reassessment.

## 2018-05-12 NOTE — Progress Notes (Signed)
Medicine attending: Medical history, presenting problems, physical findings, and medications, reviewed with resident physician Dr Nathan Boswell on the day of the patient visit and I concur with his evaluation and management plan. 

## 2018-05-12 NOTE — Progress Notes (Signed)
Diabetes Self-Management Education  Visit Type: First/Initial  Appt. Start Time: 845 Appt. End Time: 918  05/12/2018  Alicia [name], identified by name and date of birth, is a 62 y.o. female with a diagnosis of Diabetes: Type 2.   ASSESSMENT  Download 1 of professional CGM: average was for past week: 247mg /dL88-116*  Time In Range 75% Above 180 mg/dL  (above 250 mg/dL: 44%) 25% n Target Range 70-180 mg/dL 0% Below 70 mg/dL  (below 54 mg/dL: 0% Coefficient of Variation(CV)33.4%  ((19-25*)) Standard Deviation(SD)82.6mg /dL 10-26*  Lab Results  Component Value Date   HGBA1C 10.9 (A) 05/06/2018   Reviewed injections sites with Alicia [name]. There is a good chance she has been injecting her lantus into her muscle at times as she was using the top of her thigh that is lean. She also reports that last week after our discussion she began to inject her meal tine insulin before her meals. This change by itself could stop/prevent hypoglycemia.   Her blood sugars were high through the nighttime on 4-5 days despite no report of night eating or snacking and always taking her lantus. However the past two evenings her blood sugar dipped late it the evening then began to rise mid sleep. The dip could be a delay in her dinner insulin action vs her lantus or a combination of the two and the rise early am  could be due to the dawn phenomena, insufficient basal to counter it. She says she thinks she needs more insulin and has been playing "doctor" by increasing her dose of Humalog at times. She also prefers even doses of insulin because her pens show in even numbers  Diabetes Self-Management Education - 05/12/18 1600      Visit Information   Visit Type  First/Initial      Initial Visit   Diabetes Type  Type 2    Are you currently following a meal plan?  No    Are you taking your medications as prescribed?  No Taking more insulin at times "playing doctor"      Health Coping   How would you rate your  overall health?  Good      Psychosocial Assessment   Patient Belief/Attitude about Diabetes  Motivated to manage diabetes    Self-care barriers  Lack of material resources    Self-management support  Doctor's office;CDE visits    Other persons present  Family Member    Patient Concerns  Problem Solving;Glycemic Control;Support    Special Needs  None    Preferred Learning Style  No preference indicated    Learning Readiness  Ready      Pre-Education Assessment   Patient understands using medications safely.  Needs Review    Patient understands monitoring blood glucose, interpreting and using results  Needs Review    Patient understands prevention, detection, and treatment of acute complications.  Needs Review    Patient understands how to develop strategies to promote health/change behavior.  Needs Review      Complications   How often do you check your blood sugar?  1-2 times/day      Patient Education   Medications  Taught/reviewed insulin injection, site rotation, insulin storage and needle disposal.;Reviewed patients medication for diabetes, action, purpose, timing of dose and side effects.    Monitoring  Other (comment) reveiwd CGM download with patient    Personal strategies to promote health  Helped patient develop diabetes management plan for (enter comment) Insulin injection rotation  Individualized Goals (developed by patient)   Medications  take my medication as prescribed      Outcomes   Expected Outcomes  Demonstrated interest in learning. Expect positive outcomes    Future DMSE  Other (comment) 1 week    Program Status  Not Completed       Individualized Plan for Diabetes Self-Management Training:   Learning Objective:  Patient will have a greater understanding of diabetes self-management. Patient education plan is to attend individual and/or group sessions per assessed needs and concerns.   Plan:   Patient Instructions  Alicia [name]- I look forward to  talking to you more next week. You are doing a great job taking care of your diabetes!  Alicia [name] [phone]  A person needs to inject insulin into the layer of fat directly under the skin, known as subcutaneous tissue, with a small needle or a device that looks like a pen.  Several different sites can support an insulin injection.  Abdomen The abdomen is a common site for insulin injections that many people with diabetes choose. It is easy to access and often less painful than other sites due to protection by fat, greater surface area, and less muscle.  To give an injection into the abdomen, pinch a section of fatty abdominal tissue, with fingers either side.  The site should be between the waist and the hipbones about 2 inches away from the belly button.  Avoid injecting near any scar tissue on the abdomen.   Expected Outcomes:  Demonstrated interest in learning. Expect positive outcomes  Education material provided:   If problems or questions, patient to contact team via:  Phone  Future DSME appointment: Other (comment)(1 week)  Debera Lat, RD 05/12/2018 5:00 PM.

## 2018-05-13 ENCOUNTER — Ambulatory Visit: Payer: Medicare Other | Admitting: Internal Medicine

## 2018-05-19 ENCOUNTER — Ambulatory Visit (INDEPENDENT_AMBULATORY_CARE_PROVIDER_SITE_OTHER): Payer: Medicare Other | Admitting: Dietician

## 2018-05-19 ENCOUNTER — Ambulatory Visit (INDEPENDENT_AMBULATORY_CARE_PROVIDER_SITE_OTHER): Payer: Medicare Other | Admitting: Internal Medicine

## 2018-05-19 ENCOUNTER — Other Ambulatory Visit: Payer: Self-pay

## 2018-05-19 ENCOUNTER — Encounter: Payer: Self-pay | Admitting: Dietician

## 2018-05-19 ENCOUNTER — Encounter: Payer: Self-pay | Admitting: Internal Medicine

## 2018-05-19 DIAGNOSIS — E113393 Type 2 diabetes mellitus with moderate nonproliferative diabetic retinopathy without macular edema, bilateral: Secondary | ICD-10-CM

## 2018-05-19 DIAGNOSIS — Z6832 Body mass index (BMI) 32.0-32.9, adult: Secondary | ICD-10-CM

## 2018-05-19 DIAGNOSIS — Z794 Long term (current) use of insulin: Secondary | ICD-10-CM | POA: Diagnosis not present

## 2018-05-19 DIAGNOSIS — Z713 Dietary counseling and surveillance: Secondary | ICD-10-CM

## 2018-05-19 DIAGNOSIS — E113399 Type 2 diabetes mellitus with moderate nonproliferative diabetic retinopathy without macular edema, unspecified eye: Secondary | ICD-10-CM | POA: Diagnosis not present

## 2018-05-19 MED ORDER — GLUCOSE BLOOD VI STRP: ORAL_STRIP | 12 refills | Status: DC

## 2018-05-19 MED ORDER — CANAGLIFLOZIN 100 MG PO TABS: 100.0000 mg | ORAL_TABLET | Freq: Every day | ORAL | 2 refills | Status: DC

## 2018-05-19 MED ORDER — ACCU-CHEK MULTICLIX LANCETS MISC: 12 refills | Status: DC

## 2018-05-19 MED ORDER — INSULIN LISPRO 100 UNIT/ML (KWIKPEN): PEN_INJECTOR | SUBCUTANEOUS | 11 refills | Status: DC

## 2018-05-19 NOTE — Progress Notes (Addendum)
Diabetes Self-Management Education  Visit Type: Follow-up  Appt. Start Time: 845 Appt. End Time: 918  05/19/2018  [name], identified by name and date of birth, is a 62 y.o. female with a diagnosis of Diabetes: Type 2.   ASSESSMENT  Trying to lose weight. "you can't leave everything up to the medicine"  Plans to begin walking daily this week. Doesn't want to take more injections.  Forgets to take mealtime injections at times- forgot two nights this week at dinner. Fell asleep after eating.Feels patient would benefit from Medical Nutrition Therapy sessions. Continuous glucose monitoring download week 2 reviewed with patient and Dr. Charlynn [name]. Her blood sugars are better, but still above goal with no hypoglycemia.   Download 1 of professional CGM: average was for past week: 247mg /dL88-116*  Time In Range 75% Above 180 mg/dL  (above 250 mg/dL: 44%) 25% n Target Range 70-180 mg/dL 0% Below 70 mg/dL  (below 54 mg/dL: 0% Coefficient of Variation(CV)33.4%  ((19-25*)) Standard Deviation(SD)82.6mg /dL 10-26*  Download 2 of professional Continuous glucose monitoring  Lab Results  Component Value Date   HGBA1C 10.9 (A) 05/06/2018     Diabetes Self-Management Education - 05/19/18 1000      Visit Information   Visit Type  Follow-up      Initial Visit   Diabetes Type  Type 2    Are you currently following a meal plan?  No    Are you taking your medications as prescribed?  No forgets her meal time insulin at times      Health Coping   How would you rate your overall health?  Good      Exercise   Exercise Type  ADL's;Light (walking / raking leaves)    How many days per week to you exercise?  7    How many minutes per day do you exercise?  10    Total minutes per week of exercise  70      Patient Education   Previous Diabetes Education  Yes (please comment)    Medications  Reviewed patients medication for diabetes, action, purpose, timing of dose and side effects.    Monitoring  Other (comment)    Acute complications  Discussed and identified patients' treatment of hyperglycemia.    Personal strategies to promote health  Helped patient develop diabetes management plan for (enter comment) to begin walking program      Individualized Goals (developed by patient)   Medications  take my medication as prescribed      Patient Self-Evaluation of Goals - Patient rates self as meeting previously set goals (% of time)   Medications  50 - 75 %      Outcomes   Expected Outcomes  Demonstrated interest in learning. Expect positive outcomes    Future DMSE  4-6 wks    Program Status  Completed      Subsequent Visit   Since your last visit have you continued or begun to take your medications as prescribed?  Yes she forgets meal time insulin at times    Since your last visit have you had your blood pressure checked?  No    Since your last visit have you experienced any weight changes?  No change    Since your last visit, are you checking your blood glucose at least once a day?  Yes       Individualized Plan for Diabetes Self-Management Training:   Learning Objective:  Patient will have a greater understanding of diabetes self-management. Patient  education plan is to attend individual and/or group sessions per assessed needs and concerns.   Plan:   Patient Instructions  Good Job [name]!   Blood sugars are better and your weight is decreased! Beginning to take a walk every day is a good idea.   You can begin slowly and increase to 30 minutes daily. See below  Please make a follow up with me in 2-4 weeks- can be on same day you see the doctor next if better for you.    Diabetes Mellitus and Exercise Exercising regularly is important for your overall health, especially when you have diabetes (diabetes mellitus). Exercising is not only about losing weight. It has many health benefits, such as increasing muscle strength and bone density and reducing body  fat and stress. This leads to improved fitness, flexibility, and endurance, all of which result in better overall health. Exercise has additional benefits for people with diabetes, including:  Reducing appetite.  Helping to lower and control blood glucose.  Lowering blood pressure.  Helping to control amounts of fatty substances (lipids) in the blood, such as cholesterol and triglycerides.  Helping the body to respond better to insulin (improving insulin sensitivity).  Reducing how much insulin the body needs.  Decreasing the risk for heart disease by: ? Lowering cholesterol and triglyceride levels. ? Increasing the levels of good cholesterol. ? Lowering blood glucose levels.  What is my activity plan? Your health care provider or certified diabetes educator can help you make a plan for the type and frequency of exercise (activity plan) that works for you. Make sure that you:  Do at least 150 minutes of moderate-intensity or vigorous-intensity exercise each week. This could be brisk walking, biking, or water aerobics. ? Do stretching and strength exercises, such as yoga or weightlifting, at least 2 times a week. ? Spread out your activity over at least 3 days of the week.  Get some form of physical activity every day. ? Do not go more than 2 days in a row without some kind of physical activity. ? Avoid being inactive for more than 90 minutes at a time. Take frequent breaks to walk or stretch.  Choose a type of exercise or activity that you enjoy, and set realistic goals.  Start slowly, and gradually increase the intensity of your exercise over time.  Week 1- 15 minutes a day Week 2- 20 minutes a day Week 3- 25 minutes a day Week 4 30 minutes a day  Be sure to wear good shoes, check your blood sugar and drink a glass of water before you go.   What do I need to know about managing my diabetes?  Check your blood glucose before and after exercising. ? If your blood glucose is  higher than 240 mg/dL (13.3 mmol/L) before you exercise, check your urine for ketones. If you have ketones in your urine, do not exercise until your blood glucose returns to normal.  Know the symptoms of low blood glucose (hypoglycemia) and how to treat it. Your risk for hypoglycemia increases during and after exercise. Common symptoms of hypoglycemia can include: ? Hunger. ? Anxiety. ? Sweating and feeling clammy. ? Confusion. ? Dizziness or feeling light-headed. ? Increased heart rate or palpitations. ? Blurry vision. ? Tingling or numbness around the mouth, lips, or tongue. ? Tremors or shakes. ? Irritability.  Keep a rapid-acting carbohydrate snack available before, during, and after exercise to help prevent or treat hypoglycemia.  Avoid injecting insulin into areas of  the body that are going to be exercised. For example, avoid injecting insulin into: ? The arms, when playing tennis. ? The legs, when jogging.  Keep records of your exercise habits. Doing this can help you and your health care provider adjust your diabetes management plan as needed. Write down: ? Food that you eat before and after you exercise. ? Blood glucose levels before and after you exercise. ? The type and amount of exercise you have done. ? When your insulin is expected to peak, if you use insulin. Avoid exercising at times when your insulin is peaking.  When you start a new exercise or activity, work with your health care provider to make sure the activity is safe for you, and to adjust your insulin, medicines, or food intake as needed.  Drink plenty of water while you exercise to prevent dehydration or heat stroke. Drink enough fluid to keep your urine clear or pale yellow.      Expected Outcomes:  Demonstrated interest in learning. Expect positive outcomes  Education material provided: benefits of walking , AVS If problems or questions, patient to contact team via:  Phone  Future DSME appointment:  4-6 wks  Debera Lat, RD 05/19/2018 10:19 AM.

## 2018-05-19 NOTE — Assessment & Plan Note (Signed)
Lab Results  Component Value Date   HGBA1C 10.9 (A) 05/06/2018   HGBA1C 9.6 02/04/2018   HGBA1C 11.0 (H) 08/03/2017    Patient presents today for follow-up of her diabetes with interpretation of her continuous glucose monitor.  She is switched her Lantus 50 units to the morning and has been taking 14 units with breakfast and lunch with 16 units with dinner.  Alicia [name] wore the CGM for 14 days. The average reading was 249, % time in target was 19, % time below target was 0, and % time above target was.  81.  She does note missing her evening dose of insulin twice over the last week.  Unfortunately her readout results are for the full 14 days and include last week's results.  Review of her individual day readings for the past week she has not had any more lows.  She has remained elevated for most of the time.  She does have significant spikes with breakfast and some smaller spikes at lunch.  Plan: We will discontinue her DPP 4 today.  Going to start her on an SGLT2, Invokana 100 mg daily.  Given her mealtime spikes at breakfast and lunch will increase her breakfast Humalog dose to 18 units and increase her to 16 units with lunch and dinner.  We will have her come back in 1 month for follow-up and can consider up titrating her Invokana dose at that time.  She would likely benefit from another continuous glucose monitor in the future with hopes of continuing to titrate her insulin and hopefully decreasing her insulin requirements with her dietary changes.

## 2018-05-19 NOTE — Patient Instructions (Addendum)
[name],  We will make some changes to your medications today.  I want to stop your Januvia start on different medication that I think will be more effective for your diabetes.  Start Invokana in the morning with breakfast.  I have provided more information on this below.  I am also going to make some adjustments to your mealtime insulin.  I wish to take 18 units in the morning before breakfast and 16 units before lunch and dinner.  If you are not going to eat a meal do not take the mealtime insulin.  We will continue her Lantus at 50 units in the morning.  Please come back and see Alicia [name] in the next 1 to 2 weeks to continue to discuss her diet.  Come back to see Alicia [name] in clinic in 1 month for a recheck.  Canagliflozin oral tablets What is this medicine? CANAGLIFLOZIN (KAN a gli FLOE zin) helps to treat type 2 diabetes. It helps to control blood sugar. Treatment is combined with diet and exercise. This medicine may be used for other purposes; ask your health care provider or pharmacist if you have questions. COMMON BRAND NAME(S): Invokana What should I tell my health care provider before I take this medicine? They need to know if you have any of these conditions: -artery disease -dehydration -diabetic ketoacidosis -diet low in salt -eating less due to illness, surgery, dieting, or any other reason -foot sores -having surgery -high cholesterol -high levels of potassium in the blood -history of amputation -history of pancreatitis or pancreas problems -history of yeast infection of the penis or vagina -if you often drink alcohol -infections in the bladder, kidneys, or urinary tract -kidney disease -liver disease -low blood pressure -nerve damage -on hemodialysis -problems urinating -type 1 diabetes -uncircumcised female -an unusual or allergic reaction to canagliflozin, other medicines, foods, dyes, or preservatives -pregnant or trying to get pregnant -breast-feeding How should I  use this medicine? Take this medicine by mouth with a glass of water. Follow the directions on the prescription label. Take it before the first meal of the day. Take your dose at the same time each day. Do not take more often than directed. Do not stop taking except on your doctor's advice. A special MedGuide will be given to you by the pharmacist with each prescription and refill. Be sure to read this information carefully each time. Talk to your pediatrician regarding the use of this medicine in children. Special care may be needed. Overdosage: If you think you have taken too much of this medicine contact a poison control center or emergency room at once. NOTE: This medicine is only for you. Do not share this medicine with others. What if I miss a dose? If you miss a dose, take it as soon as you can. If it is almost time for your next dose, take only that dose. Do not take double or extra doses. What may interact with this medicine? Do not take this medicine with any of the following medications: -gatifloxacin This medicine may also interact with the following medications: -alcohol -certain medicines for blood pressure, heart disease -digoxin -diuretics -insulin -nateglinide -phenobarbital -phenytoin -repaglinide -rifampin -ritonavir -sulfonylureas like glimepiride, glipizide, glyburide This list may not describe all possible interactions. Give your health care provider a list of all the medicines, herbs, non-prescription drugs, or dietary supplements you use. Also tell them if you smoke, drink alcohol, or use illegal drugs. Some items may interact with your medicine. What should I watch for  while using this medicine? Visit your doctor or health care professional for regular checks on your progress. This medicine can cause a serious condition in which there is too much acid in the blood. If you develop nausea, vomiting, stomach pain, unusual tiredness, or breathing problems, stop taking  this medicine and call your doctor right away. If possible, use a ketone dipstick to check for ketones in your urine. A test called the HbA1C (A1C) will be monitored. This is a simple blood test. It measures your blood sugar control over the last 2 to 3 months. You will receive this test every 3 to 6 months. Learn how to check your blood sugar. Learn the symptoms of low and high blood sugar and how to manage them. Always carry a quick-source of sugar with you in case you have symptoms of low blood sugar. Examples include hard sugar candy or glucose tablets. Make sure others know that you can choke if you eat or drink when you develop serious symptoms of low blood sugar, such as seizures or unconsciousness. They must get medical help at once. Tell your doctor or health care professional if you have high blood sugar. You might need to change the dose of your medicine. If you are sick or exercising more than usual, you might need to change the dose of your medicine. Do not skip meals. Ask your doctor or health care professional if you should avoid alcohol. Many nonprescription cough and cold products contain sugar or alcohol. These can affect blood sugar. Wear a medical ID bracelet or chain, and carry a card that describes your disease and details of your medicine and dosage times. What side effects may I notice from receiving this medicine? Side effects that you should report to your doctor or health care professional as soon as possible: -allergic reactions like skin rash, itching or hives, swelling of the face, lips, or tongue -breathing problems -chest pain -dizziness -fast or irregular heartbeat -feeling faint or lightheaded, falls -muscle weakness -nausea, vomiting, unusual stomach upset or pain -new pain or tenderness, change in skin color, sores or ulcers, or infection in legs or feet -signs and symptoms of low blood sugar such as feeling anxious, confusion, dizziness, increased hunger,  unusually weak or tired, sweating, shakiness, cold, irritable, headache, blurred vision, fast heartbeat, loss of consciousness -signs and symptoms of a urinary tract infection, such as fever, chills, a burning feeling when urinating, blood in the urine, back pain -trouble passing urine or change in the amount of urine, including an urgent need to urinate more often, in larger amounts, or at night -penile discharge, itching, or pain in men -unusual tiredness -vaginal discharge, itching, or odor in women Side effects that usually do not require medical attention (report to your doctor or health care professional if they continue or are bothersome): -constipation -mild increase in urination -thirsty This list may not describe all possible side effects. Call your doctor for medical advice about side effects. You may report side effects to FDA at 1-800-FDA-1088. Where should I keep my medicine? Keep out of the reach of children. Store at room temperature between 20 and 25 degrees C (68 and 77 degrees F). Throw away any unused medicine after the expiration date. NOTE: This sheet is a summary. It may not cover all possible information. If you have questions about this medicine, talk to your doctor, pharmacist, or health care provider.  2018 [employer] Standard (2016-04-07 14:17:20)

## 2018-05-19 NOTE — Patient Instructions (Signed)
Good Job Ms. Wollschlager!   Blood sugars are better and your weight is decreased! Beginning to take a walk every day is a good idea.   You can begin slowly and increase to 30 minutes daily. See below  Please make a follow up with me in 2-4 weeks- can be on same day you see the doctor next if better for you.    Diabetes Mellitus and Exercise Exercising regularly is important for your overall health, especially when you have diabetes (diabetes mellitus). Exercising is not only about losing weight. It has many health benefits, such as increasing muscle strength and bone density and reducing body fat and stress. This leads to improved fitness, flexibility, and endurance, all of which result in better overall health. Exercise has additional benefits for people with diabetes, including:  Reducing appetite.  Helping to lower and control blood glucose.  Lowering blood pressure.  Helping to control amounts of fatty substances (lipids) in the blood, such as cholesterol and triglycerides.  Helping the body to respond better to insulin (improving insulin sensitivity).  Reducing how much insulin the body needs.  Decreasing the risk for heart disease by: ? Lowering cholesterol and triglyceride levels. ? Increasing the levels of good cholesterol. ? Lowering blood glucose levels.  What is my activity plan? Your health care provider or certified diabetes educator can help you make a plan for the type and frequency of exercise (activity plan) that works for you. Make sure that you:  Do at least 150 minutes of moderate-intensity or vigorous-intensity exercise each week. This could be brisk walking, biking, or water aerobics. ? Do stretching and strength exercises, such as yoga or weightlifting, at least 2 times a week. ? Spread out your activity over at least 3 days of the week.  Get some form of physical activity every day. ? Do not go more than 2 days in a row without some kind of physical  activity. ? Avoid being inactive for more than 90 minutes at a time. Take frequent breaks to walk or stretch.  Choose a type of exercise or activity that you enjoy, and set realistic goals.  Start slowly, and gradually increase the intensity of your exercise over time.  Week 1- 15 minutes a day Week 2- 20 minutes a day Week 3- 25 minutes a day Week 4 30 minutes a day  Be sure to wear good shoes, check your blood sugar and drink a glass of water before you go.   What do I need to know about managing my diabetes?  Check your blood glucose before and after exercising. ? If your blood glucose is higher than 240 mg/dL (13.3 mmol/L) before you exercise, check your urine for ketones. If you have ketones in your urine, do not exercise until your blood glucose returns to normal.  Know the symptoms of low blood glucose (hypoglycemia) and how to treat it. Your risk for hypoglycemia increases during and after exercise. Common symptoms of hypoglycemia can include: ? Hunger. ? Anxiety. ? Sweating and feeling clammy. ? Confusion. ? Dizziness or feeling light-headed. ? Increased heart rate or palpitations. ? Blurry vision. ? Tingling or numbness around the mouth, lips, or tongue. ? Tremors or shakes. ? Irritability.  Keep a rapid-acting carbohydrate snack available before, during, and after exercise to help prevent or treat hypoglycemia.  Avoid injecting insulin into areas of the body that are going to be exercised. For example, avoid injecting insulin into: ? The arms, when playing tennis. ? The legs,  when jogging.  Keep records of your exercise habits. Doing this can help you and your health care provider adjust your diabetes management plan as needed. Write down: ? Food that you eat before and after you exercise. ? Blood glucose levels before and after you exercise. ? The type and amount of exercise you have done. ? When your insulin is expected to peak, if you use insulin. Avoid  exercising at times when your insulin is peaking.  When you start a new exercise or activity, work with your health care provider to make sure the activity is safe for you, and to adjust your insulin, medicines, or food intake as needed.  Drink plenty of water while you exercise to prevent dehydration or heat stroke. Drink enough fluid to keep your urine clear or pale yellow.

## 2018-05-19 NOTE — Progress Notes (Signed)
   CC: Diabetes follow-up for continuous glucose monitor interpretation  HPI:  [name] is a 62 y.o. female with a past medical history listed below here today for follow up of her diabetes with continuous glucose monitor interpretation  For details of today's visit and the status of her chronic medical issues please refer to the assessment and plan.   Past Medical History:  Diagnosis Date  . Acquired lactose intolerance 09/24/2017  . 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  Non-functioning  . Aortic atherosclerosis ([place]) 09/24/2017   Asymptomatic, found on CT scan  . Blood transfusion without reported diagnosis   . Chronic Systolic Heart Failure 1/75/1025   Felt to be non-ischemic and secondary to hypertension.  Echo (05/28/2014): LVEF 25%.  Is not interested in AICD placement.  Marland Kitchen COPD exacerbation ([place])   . Coronary artery disease involving native coronary artery of native heart without angina pectoris 11/09/2014   Cardiac cath (02/18/2014): Non-obstructive, mRCA 30%, dRCA 60%  . 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 Diverticulosis of colon 09/24/2017  . Essential hypertension 10/15/2013  . Gastroesophageal reflux disease 09/24/2017  . History of cerebrovascular accident 05/10/2013   Per patient report 6 previous strokes, most recent on 05/10/13.  No residual deficits.  . Hyperlipidemia   . Overweight (BMI 25.0-29.9) 09/24/2017  . Psoriasis 09/24/2017  . Seasonal allergic rhinitis due to pollen 09/24/2017   Spring and early Fall  . Small Bowel Obstruction (SBO) 01/13/2014   Ex-Lap & Lysis of Adhesion, 01/15/2014  . Tobacco use disorder 01/15/2014  . 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 CARDIAC- RRR, no murmurs, rubs or gallops. RESP- Moving equal volumes of air, and clear to auscultation bilaterally, no wheezes or crackles. EXTREMIITES - DP/PT 2+ bilaterally PSYCH- Normal mood and affect, appropriate thought content and speech.   Assessment & Plan:   See Encounters Tab for problem based charting.  Patient discussed with Dr. Daryll Drown

## 2018-05-20 NOTE — Progress Notes (Signed)
[facility] Attending  Case discussed with Dr. Boswell at the time of the visit.  We reviewed the resident's history and exam and pertinent patient test results.  I agree with the assessment, diagnosis, and plan of care documented in the resident's note.  

## 2018-05-24 ENCOUNTER — Telehealth: Payer: Self-pay | Admitting: Physician Assistant

## 2018-05-24 ENCOUNTER — Ambulatory Visit (INDEPENDENT_AMBULATORY_CARE_PROVIDER_SITE_OTHER): Payer: Medicare Other | Admitting: Physician Assistant

## 2018-05-24 ENCOUNTER — Encounter: Payer: Self-pay | Admitting: Physician Assistant

## 2018-05-24 VITALS — BP 150/80 | HR 61 | Ht 67.0 in | Wt 209.0 lb

## 2018-05-24 DIAGNOSIS — I10 Essential (primary) hypertension: Secondary | ICD-10-CM | POA: Diagnosis not present

## 2018-05-24 DIAGNOSIS — I5023 Acute on chronic systolic (congestive) heart failure: Secondary | ICD-10-CM | POA: Diagnosis not present

## 2018-05-24 DIAGNOSIS — Z79899 Other long term (current) drug therapy: Secondary | ICD-10-CM

## 2018-05-24 MED ORDER — SACUBITRIL-VALSARTAN 49-51 MG PO TABS: 1.0000 | ORAL_TABLET | Freq: Two times a day (BID) | ORAL | 3 refills | Status: DC

## 2018-05-24 NOTE — Telephone Encounter (Signed)
Called [facility] back, [place], spoke with  to let them know that Lisinopril has been d/c'd at last office visit.
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
## 2018-05-24 NOTE — Telephone Encounter (Signed)
New message  Pt c/o medication issue:  1. Name of Medication: sacubitril-valsartan (ENTRESTO) 49-51 MG and Lisinopril  2. How are you currently taking this medication (dosage and times per day)?   3. Are you having a reaction (difficulty breathing--STAT)? NO  4. What is your medication issue? Pharmacy calling to confirm patient's medication for possible drug interactions

## 2018-05-24 NOTE — Patient Instructions (Signed)
Medication Instructions:  Your physician has recommended you make the following change in your medication:  1.  INCREASE the Entresto to 49/51 mg taking 1 tablet by mouth twice a day   Labwork: 2 WEEKS:  BMET  Testing/Procedures: None ordered  Follow-Up: Your physician recommends that you schedule a follow-up appointment in: 3 MONTHS WITH DR. Marlou Porch   Any Other Special Instructions Will Be Listed Below (If Applicable).     If you need a refill on your cardiac medications before your next appointment, please call your pharmacy.

## 2018-06-07 ENCOUNTER — Other Ambulatory Visit: Payer: Medicare Other

## 2018-06-07 DIAGNOSIS — I5023 Acute on chronic systolic (congestive) heart failure: Secondary | ICD-10-CM | POA: Diagnosis not present

## 2018-06-07 DIAGNOSIS — I10 Essential (primary) hypertension: Secondary | ICD-10-CM | POA: Diagnosis not present

## 2018-06-07 LAB — BASIC METABOLIC PANEL
BUN/Creatinine Ratio: 17 (ref 12–28)
BUN: 12 mg/dL (ref 8–27)
CALCIUM: 9.2 mg/dL (ref 8.7–10.3)
CHLORIDE: 95 mmol/L — AB (ref 96–106)
CO2: 28 mmol/L (ref 20–29)
Creatinine, Ser: 0.72 mg/dL (ref 0.57–1.00)
GFR, EST AFRICAN AMERICAN: 105 mL/min/{1.73_m2} (ref >59)
GFR, EST NON AFRICAN AMERICAN: 91 mL/min/{1.73_m2} (ref >59)
Glucose: 117 mg/dL — ABNORMAL HIGH (ref 65–99)
Potassium: 4.2 mmol/L (ref 3.5–5.2)
Sodium: 139 mmol/L (ref 134–144)

## 2018-06-08 ENCOUNTER — Encounter: Payer: Self-pay | Admitting: Physician Assistant

## 2018-06-16 ENCOUNTER — Encounter: Payer: Self-pay | Admitting: Dietician

## 2018-06-16 ENCOUNTER — Ambulatory Visit (INDEPENDENT_AMBULATORY_CARE_PROVIDER_SITE_OTHER): Payer: Medicare Other | Admitting: Dietician

## 2018-06-16 ENCOUNTER — Ambulatory Visit (HOSPITAL_COMMUNITY)
Admission: RE | Admit: 2018-06-16 | Discharge: 2018-06-16 | Disposition: A | Discharge status: Home / Self Care | Payer: Medicare Other | Source: Ambulatory Visit | Attending: Internal Medicine | Admitting: Internal Medicine

## 2018-06-16 ENCOUNTER — Encounter: Payer: Self-pay | Admitting: Internal Medicine

## 2018-06-16 ENCOUNTER — Other Ambulatory Visit: Payer: Self-pay

## 2018-06-16 ENCOUNTER — Ambulatory Visit (INDEPENDENT_AMBULATORY_CARE_PROVIDER_SITE_OTHER): Payer: Medicare Other | Admitting: Internal Medicine

## 2018-06-16 VITALS — BP 145/66 | HR 63 | Temp 98.6°F | Wt 211.9 lb

## 2018-06-16 DIAGNOSIS — Z794 Long term (current) use of insulin: Secondary | ICD-10-CM

## 2018-06-16 DIAGNOSIS — L988 Other specified disorders of the skin and subcutaneous tissue: Secondary | ICD-10-CM | POA: Diagnosis not present

## 2018-06-16 DIAGNOSIS — R0602 Shortness of breath: Secondary | ICD-10-CM | POA: Insufficient documentation

## 2018-06-16 DIAGNOSIS — R062 Wheezing: Secondary | ICD-10-CM

## 2018-06-16 DIAGNOSIS — Z79899 Other long term (current) drug therapy: Secondary | ICD-10-CM

## 2018-06-16 DIAGNOSIS — Z713 Dietary counseling and surveillance: Secondary | ICD-10-CM

## 2018-06-16 DIAGNOSIS — R05 Cough: Secondary | ICD-10-CM | POA: Diagnosis not present

## 2018-06-16 DIAGNOSIS — E113393 Type 2 diabetes mellitus with moderate nonproliferative diabetic retinopathy without macular edema, bilateral: Secondary | ICD-10-CM

## 2018-06-16 DIAGNOSIS — I444 Left anterior fascicular block: Secondary | ICD-10-CM

## 2018-06-16 DIAGNOSIS — I5022 Chronic systolic (congestive) heart failure: Secondary | ICD-10-CM

## 2018-06-16 DIAGNOSIS — I110 Hypertensive heart disease with heart failure: Secondary | ICD-10-CM

## 2018-06-16 DIAGNOSIS — R0600 Dyspnea, unspecified: Secondary | ICD-10-CM | POA: Insufficient documentation

## 2018-06-16 DIAGNOSIS — E113399 Type 2 diabetes mellitus with moderate nonproliferative diabetic retinopathy without macular edema, unspecified eye: Secondary | ICD-10-CM

## 2018-06-16 DIAGNOSIS — I2510 Atherosclerotic heart disease of native coronary artery without angina pectoris: Secondary | ICD-10-CM

## 2018-06-16 DIAGNOSIS — F17211 Nicotine dependence, cigarettes, in remission: Secondary | ICD-10-CM

## 2018-06-16 DIAGNOSIS — J449 Chronic obstructive pulmonary disease, unspecified: Secondary | ICD-10-CM

## 2018-06-16 NOTE — Patient Instructions (Signed)
Alicia [name],  We are doing some lab tests to help find the cause of your shortness of breath. We also discussed benefit of avoiding sugary drinks for your diabetes. Please take all of your medications as prescribed and follow up with our heart tests and lung tests. Thank you very much.  -Gilberto Better, MD   Diabetes Mellitus and Nutrition When you have diabetes (diabetes mellitus), it is very important to have healthy eating habits because your blood sugar (glucose) levels are greatly affected by what you eat and drink. Eating healthy foods in the appropriate amounts, at about the same times every day, can help you:  Control your blood glucose.  Lower your risk of heart disease.  Improve your blood pressure.  Reach or maintain a healthy weight.  Every person with diabetes is different, and each person has different needs for a meal plan. Your health care provider may recommend that you work with a diet and nutrition specialist (dietitian) to make a meal plan that is best for you. Your meal plan may vary depending on factors such as:  The calories you need.  The medicines you take.  Your weight.  Your blood glucose, blood pressure, and cholesterol levels.  Your activity level.  Other health conditions you have, such as heart or kidney disease.  How do carbohydrates affect me? Carbohydrates affect your blood glucose level more than any other type of food. Eating carbohydrates naturally increases the amount of glucose in your blood. Carbohydrate counting is a method for keeping track of how many carbohydrates you eat. Counting carbohydrates is important to keep your blood glucose at a healthy level, especially if you use insulin or take certain oral diabetes medicines. It is important to know how many carbohydrates you can safely have in each meal. This is different for every person. Your dietitian can help you calculate how many carbohydrates you should have at each meal and for  snack. Foods that contain carbohydrates include:  Bread, cereal, rice, pasta, and crackers.  Potatoes and corn.  Peas, beans, and lentils.  Milk and yogurt.  Fruit and juice.  Desserts, such as cakes, cookies, ice cream, and candy.  How does alcohol affect me? Alcohol can cause a sudden decrease in blood glucose (hypoglycemia), especially if you use insulin or take certain oral diabetes medicines. Hypoglycemia can be a life-threatening condition. Symptoms of hypoglycemia (sleepiness, dizziness, and confusion) are similar to symptoms of having too much alcohol. If your health care provider says that alcohol is safe for you, follow these guidelines:  Limit alcohol intake to no more than 1 drink per day for nonpregnant women and 2 drinks per day for men. One drink equals 12 oz of beer, 5 oz of wine, or 1 oz of hard liquor.  Do not drink on an empty stomach.  Keep yourself hydrated with water, diet soda, or unsweetened iced tea.  Keep in mind that regular soda, juice, and other mixers may contain a lot of sugar and must be counted as carbohydrates.  What are tips for following this plan? Reading food labels  Start by checking the serving size on the label. The amount of calories, carbohydrates, fats, and other nutrients listed on the label are based on one serving of the food. Many foods contain more than one serving per package.  Check the total grams (g) of carbohydrates in one serving. You can calculate the number of servings of carbohydrates in one serving by dividing the total carbohydrates by 15. For example, if  a food has 30 g of total carbohydrates, it would be equal to 2 servings of carbohydrates.  Check the number of grams (g) of saturated and trans fats in one serving. Choose foods that have low or no amount of these fats.  Check the number of milligrams (mg) of sodium in one serving. Most people should limit total sodium intake to less than 2,300 mg per day.  Always  check the nutrition information of foods labeled as "low-fat" or "nonfat". These foods may be higher in added sugar or refined carbohydrates and should be avoided.  Talk to your dietitian to identify your daily goals for nutrients listed on the label. Shopping  Avoid buying canned, premade, or processed foods. These foods tend to be high in fat, sodium, and added sugar.  Shop around the outside edge of the grocery store. This includes fresh fruits and vegetables, bulk grains, fresh meats, and fresh dairy. Cooking  Use low-heat cooking methods, such as baking, instead of high-heat cooking methods like deep frying.  Cook using healthy oils, such as olive, canola, or sunflower oil.  Avoid cooking with butter, cream, or high-fat meats. Meal planning  Eat meals and snacks regularly, preferably at the same times every day. Avoid going long periods of time without eating.  Eat foods high in fiber, such as fresh fruits, vegetables, beans, and whole grains. Talk to your dietitian about how many servings of carbohydrates you can eat at each meal.  Eat 4-6 ounces of lean protein each day, such as lean meat, chicken, fish, eggs, or tofu. 1 ounce is equal to 1 ounce of meat, chicken, or fish, 1 egg, or 1/4 cup of tofu.  Eat some foods each day that contain healthy fats, such as avocado, nuts, seeds, and fish. Lifestyle   Check your blood glucose regularly.  Exercise at least 30 minutes 5 or more days each week, or as told by your health care provider.  Take medicines as told by your health care provider.  Do not use any products that contain nicotine or tobacco, such as cigarettes and e-cigarettes. If you need help quitting, ask your health care provider.  Work with a Social worker or diabetes educator to identify strategies to manage stress and any emotional and social challenges. What are some questions to ask my health care provider?  Do I need to meet with a diabetes educator?  Do I need  to meet with a dietitian?  What number can I call if I have questions?  When are the best times to check my blood glucose? Where to find more information:  American Diabetes Association: diabetes.org/food-and-fitness/food  Academy of Nutrition and Dietetics: PokerClues.dk  [employer] of Diabetes and Digestive and Kidney Diseases (NIH): ContactWire.be Summary  A healthy meal plan will help you control your blood glucose and maintain a healthy lifestyle.  Working with a diet and nutrition specialist (dietitian) can help you make a meal plan that is best for you.  Keep in mind that carbohydrates and alcohol have immediate effects on your blood glucose levels. It is important to count carbohydrates and to use alcohol carefully. This information is not intended to replace advice given to you by your health care provider. Make sure you discuss any questions you have with your health care provider. Document Released: 08/06/2005 Document Revised: 12/14/2016 Document Reviewed: 12/14/2016 Elsevier Interactive Patient Education  [employer].

## 2018-06-16 NOTE — Progress Notes (Addendum)
CC: Diabetes management and shortness of breath on exertion  HPI: [name] is a 63 y.o. F w/ PMH of D6LO, Chronic Systolic Heart Failure (EF 25% on 2015), COPD and CAD presenting to the clinic for continuing management of his T2DM and complaints of shortness of breath on exertion. On her last visit at the clinic, her insulin regimen was changed to 18 units in the morning before breakfast and 16 units before lunch and dinner for humalog and 50 units Lantus at morning instead of bedtime. She was also started on Invokana. She states she has been taking her insulin at 18 units qac humalog and 50 units qAM. She notes that her glucometer readings have been mostly above 200~250s except in the mornings, which are around 100~150. She does mention that her diet includes lot of simple carbs and canned sodas which she has been trying to avoid. She denies any episodes of hypoglycemia but does describe episodes of vaginal itching when she first started Invokana which has now self-resolved.    [name], she reveals that she has been taking her insulin quite some time after she finish eating and measuring her blood glucose right after she takes her insulin. She states that it is difficult to climb the stairs to grab her glucometer as she runs out of breath with exertion. She also endorses cough but w/o any productive sputum. She states this is a chronic issue but she has ntoiced worsening of her shortness of breath over the last 6 months or so. This issue was addressed in the past and she was ordered for a pulmonary function test but she never 'heard anything about it.' She has a ~20 pack year smoking history but has not smoked in the last 2 weeks. She denies any chest pain, palpitations, orthopnea, or lower extremity edema.  Past Medical History:  Diagnosis Date  . Acquired lactose intolerance 09/24/2017  . 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  Non-functioning  . Aortic atherosclerosis ([place]) 09/24/2017   Asymptomatic, found on CT scan  . Blood transfusion without reported diagnosis   . Chronic Systolic Heart Failure 7/56/4332   Felt to be non-ischemic and secondary to hypertension.  Echo (05/28/2014): LVEF 25%.  Is not interested in AICD placement.  Marland Kitchen COPD exacerbation ([place])   . Coronary artery disease involving native coronary artery of native heart without angina pectoris 11/09/2014   Cardiac cath (02/18/2014): Non-obstructive, mRCA 30%, dRCA 60%  . 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 Diverticulosis of colon 09/24/2017  . Essential hypertension 10/15/2013  . Gastroesophageal reflux disease 09/24/2017  . History of cerebrovascular accident 05/10/2013   Per patient report 6 previous strokes, most recent on 05/10/13.  No residual deficits.  . Hyperlipidemia   . Overweight (BMI 25.0-29.9) 09/24/2017  . Psoriasis 09/24/2017  . Seasonal allergic rhinitis due to pollen 09/24/2017   Spring and early Fall  . Small Bowel Obstruction (SBO) 01/13/2014   Ex-Lap & Lysis of Adhesion, 01/15/2014  . Tobacco use disorder 01/15/2014  . Type 2 diabetes mellitus with moderate nonproliferative diabetic retinopathy ([place]) 10/15/2013   Review of Systems: Review of Systems  Constitutional: Negative for chills, fever and malaise/fatigue.  Eyes: Negative for blurred vision and double vision.  Respiratory: Positive for cough and shortness of breath. Negative for sputum production and wheezing.   Cardiovascular: Negative for chest pain,  palpitations and orthopnea.  Gastrointestinal: Negative for constipation, diarrhea, nausea and vomiting.  Neurological: Negative for dizziness, sensory change, weakness and headaches.    Physical Exam: Vitals:   06/16/18 0834  BP: (!) 145/66  Pulse: 63  Temp: 98.6 F (37 C)  TempSrc: Oral  SpO2: 95%  Weight: 211 lb 14.4 oz (96.1 kg)   Physical Exam    Constitutional: She is oriented to person, place, and time. She appears well-developed and well-nourished. No distress.  Neck: Normal range of motion. Neck supple. No JVD present.  Cardiovascular: Normal rate, regular rhythm, normal heart sounds and intact distal pulses.  Respiratory: Effort normal. No respiratory distress. She has wheezes (faint wheezing). She has no rales.  GI: Soft. Bowel sounds are normal. She exhibits no distension. There is no tenderness. There is no guarding.  Neurological: She is alert and oriented to person, place, and time. She has normal reflexes.  Pinprick sensation intact bilateral lower extremities  Skin: Skin is warm and dry. No rash noted. No erythema.  No ulcers on bilateral feet. Thick, silvery, scaly lesions on elbows, knees and heels    Assessment & Plan:   See Encounters Tab for problem based charting.  Patient seen with Dr. Rebeca Alert   -Gilberto Better, PGY1

## 2018-06-16 NOTE — Patient Instructions (Signed)
What we talked about today:   Moving more  Taking meal tine insulin before eating  The plan:   1- It is best to take your HUMALOG about 15 minutes  BEFORE EATING. To do this- you can keep your meter and Hummalog pen downstairs in the kitchen.   2- You now belong to the [place]. I gave you your activation code: [id number]  I gave you a list of where you can go to exercise indoors and with a group.    Please make an appointment to follow up with me in 4 weeks .  Call anytime with questions or concerns  Debera Lat Diabetes Educator [phone]

## 2018-06-16 NOTE — Progress Notes (Signed)
Diabetes Self-Management Education  Visit Type: Follow-up  Appt. Start Time: 845 Appt. End Time: 918  06/16/2018  [name], identified by name and date of birth, is a 62 y.o. female with a diagnosis of Diabetes:  .   ASSESSMENT  Trying to lose weight. Has not started walking says her calves hurt.   No hypoglycemia reported or on meter download. Eats most meals at home.  Blood sugar appears to be improved comparing meter download form today to CGM results . Average glucose from Continuous glucose monitoring was 249, her average today is 220, range IS 110-381 with her fasting being her lowest values of 183 and her trend through the day is increasing blood sugars. This is consistent with her report of taking her meal tine insulin after eating.   Lab Results  Component Value Date   HGBA1C 10.9 (A) 05/06/2018    Diabetes Self-Management Education - 06/16/18 1400      Visit Information   Visit Type  Follow-up      Health Coping   How would you rate your overall health?  Good      Psychosocial Assessment   Patient Belief/Attitude about Diabetes  Motivated to manage diabetes    Self-care barriers  Lack of material resources    Self-management support  Doctor's office;Family;CDE visits    Patient Concerns  Glycemic Control;Weight Control;Support    Special Needs  None    Preferred Learning Style  No preference indicated    Learning Readiness  Ready      Dietary Intake   Beverage(s)  only a little regular soda in past month      Exercise   Exercise Type  ADL's;Light (walking / raking leaves)    How many days per week to you exercise?  7    How many minutes per day do you exercise?  10    Total minutes per week of exercise  70      Patient Education   Previous Diabetes Education  Yes (please comment) here    Medications  Reviewed patients medication for diabetes, action, purpose, timing of dose and side effects.    Acute complications  Taught treatment of hypoglycemia -  the 15 rule.    Personal strategies to promote health  Lifestyle issues that need to be addressed for better diabetes care also discussed barriers to her moving more/walking      Individualized Goals (developed by patient)   Medications  take my medication as prescribed      Patient Self-Evaluation of Goals - Patient rates self as meeting previously set goals (% of time)   Medications  50 - 75 % improve timing of meal time insulin with food      Outcomes   Expected Outcomes  Demonstrated interest in learning. Expect positive outcomes    Future DMSE  4-6 wks    Program Status  Completed      Subsequent Visit   Since your last visit have you continued or begun to take your medications as prescribed?  Yes    Since your last visit have you had your blood pressure checked?  No    Since your last visit have you experienced any weight changes?  No change    Since your last visit, are you checking your blood glucose at least once a day?  Yes       Individualized Plan for Diabetes Self-Management Training:   Learning Objective:  Patient will have a greater understanding of  diabetes self-management. Patient education plan is to attend individual and/or group sessions per assessed needs and concerns.  My plan to support myself in continuing these changes to care for my diabetes is to attend or contact:   Assisted patient with signing up for her health plan's fitness program and learning where she can go to access it.  family, doctor's office, CDE, Dietitian, pharmacist, church  Plan:   Patient Instructions  What we talked about today:   Moving more  Taking meal tine insulin before eating  The plan:   1- It is best to take your HUMALOG about 15 minutes  BEFORE EATING. To do this- you can keep your meter and Hummalog pen downstairs in the kitchen.   2- You now belong to the [employer]. I gave you your activation code: [id number]  I gave you a list of where you can go to exercise  indoors and with a group.    Please make an appointment to follow up with me in 4 weeks .  Call anytime with questions or concerns  Debera Lat Diabetes Educator [phone]    Expected Outcomes:  Demonstrated interest in learning. Expect positive outcomes  Education material provided: benefits of walking , AVS If problems or questions, patient to contact team via:  Phone  Future DSME appointment: 4-6 wks  Debera Lat, RD 06/16/2018 2:27 PM.

## 2018-06-16 NOTE — Assessment & Plan Note (Signed)
-   Patient visiting clinic for diabetes management - Last HgbA1c on 05/06/18: 10.9 - Currently on Lantus 50 units qAM, Humalog 18units qAC, Invokana 100mg , Metformin 1000mg  BID - Patient's glucometer readings were downloaded w/ 68 readings in the last 4 weeks:  Average readings: 220  Fasting average: 180  Morning post-meal average: 223  Afternoon post-meal average: 259  Evening post-meal average: 261  Episodes of hypoglycemia: 0 - Patient denies any sx of hypoglycemia - Diet includes simple carbs and sugary drinks: white bread and canned sodas  - Patient's T2DM continues to be uncontrolled despite CGM in June and increase in insulin regimen and addition of SGLT2 - Her post-meal readings may not accurately reflect efficacy of her mealtime insulin as she takes her Humalog quite after she has finished her meal and may be checking her bg before her mealtime insulin has time to work - Counseled patient on correct timing for mealtime insulin with glucometer checks - She will meet with Debera Lat, RD today for nutritional counseling - Patient strongly expresses her intention to change her diet in avoiding sweet drinks and simple carbs - C/w current regimen w/ dietary adjustments - Follow up in 1 month

## 2018-06-16 NOTE — Assessment & Plan Note (Addendum)
-   Patient complains of shortness of breath on exertion worsening over the last 6 months with associated dry cough - Had prior PFT scheduled for assessment but did not complete - 20 pack year smoking hx with half a pack daily but has not smoked in the last 2 wks - Satting 95% on room air in clinic today - Shortness of breath affecting her ability to measure glucose regularly for her T2DM  - Shortness of breath on exertion 2/2 Asthma vs COPD vs CHF vs Anemia - Check CBC to r/o anemia - Rescheduled patient's PFT (order already placed in 01/2018) - Repeat ECHO to assess CHF (last echo in 2015 EF 25%)

## 2018-06-16 NOTE — Assessment & Plan Note (Signed)
-   Hx of non-ischemic chronic systolic heart failure 2/2 HTN - Currently on Lasix 40mg  BID, Coreg 12.5mg  daily, Ernesto 49-51mg  BID - Echo 05/28/14 show LVEF of 46% - No murmurs/ clicks/ rubs on exam - EKG at clinic today show Left anterior fascicular block and left axis deviation. Old T wave inversions consistent with prior EKGs  - New worsening of shortness of breath possibly 2/2 acute exacerbation of CHF - Order for Echo today - F/u with cardiology if worsening CHF

## 2018-06-17 LAB — CBC
Hematocrit: 42.7 % (ref 34.0–46.6)
Hemoglobin: 13.7 g/dL (ref 11.1–15.9)
MCH: 26 pg — ABNORMAL LOW (ref 26.6–33.0)
MCHC: 32.1 g/dL (ref 31.5–35.7)
MCV: 81 fL (ref 79–97)
Platelets: 282 10*3/uL (ref 150–450)
RBC: 5.27 x10E6/uL (ref 3.77–5.28)
RDW: 16.6 % — ABNORMAL HIGH (ref 12.3–15.4)
WBC: 6.7 10*3/uL (ref 3.4–10.8)

## 2018-06-21 ENCOUNTER — Telehealth: Payer: Self-pay | Admitting: Internal Medicine

## 2018-06-21 NOTE — Telephone Encounter (Signed)
Spoke with patient about her CBC results. She confirmed understanding. She had questions about her PFT and ECHo tests. Confirmed the date and time with her for Thursday morning. Let her know that we will call once she gets those tests done and we get the results.

## 2018-06-23 ENCOUNTER — Ambulatory Visit (HOSPITAL_COMMUNITY)
Admission: RE | Admit: 2018-06-23 | Discharge: 2018-06-23 | Disposition: A | Discharge status: Home / Self Care | Payer: Medicare Other | Source: Ambulatory Visit | Attending: Oncology | Admitting: Oncology

## 2018-06-23 ENCOUNTER — Ambulatory Visit (HOSPITAL_BASED_OUTPATIENT_CLINIC_OR_DEPARTMENT_OTHER)
Admission: RE | Admit: 2018-06-23 | Discharge: 2018-06-23 | Disposition: A | Discharge status: Home / Self Care | Payer: Medicare Other | Source: Ambulatory Visit | Attending: Internal Medicine | Admitting: Internal Medicine

## 2018-06-23 DIAGNOSIS — I509 Heart failure, unspecified: Secondary | ICD-10-CM | POA: Diagnosis not present

## 2018-06-23 DIAGNOSIS — J449 Chronic obstructive pulmonary disease, unspecified: Secondary | ICD-10-CM | POA: Diagnosis not present

## 2018-06-23 DIAGNOSIS — E785 Hyperlipidemia, unspecified: Secondary | ICD-10-CM | POA: Insufficient documentation

## 2018-06-23 DIAGNOSIS — I110 Hypertensive heart disease with heart failure: Secondary | ICD-10-CM | POA: Diagnosis not present

## 2018-06-23 DIAGNOSIS — R0602 Shortness of breath: Secondary | ICD-10-CM

## 2018-06-23 DIAGNOSIS — F17200 Nicotine dependence, unspecified, uncomplicated: Secondary | ICD-10-CM | POA: Diagnosis not present

## 2018-06-23 DIAGNOSIS — I081 Rheumatic disorders of both mitral and tricuspid valves: Secondary | ICD-10-CM | POA: Diagnosis not present

## 2018-06-23 DIAGNOSIS — E119 Type 2 diabetes mellitus without complications: Secondary | ICD-10-CM | POA: Diagnosis not present

## 2018-06-23 DIAGNOSIS — R058 Other specified cough: Secondary | ICD-10-CM

## 2018-06-23 DIAGNOSIS — I2510 Atherosclerotic heart disease of native coronary artery without angina pectoris: Secondary | ICD-10-CM | POA: Insufficient documentation

## 2018-06-23 DIAGNOSIS — R05 Cough: Secondary | ICD-10-CM

## 2018-06-23 DIAGNOSIS — R0902 Hypoxemia: Secondary | ICD-10-CM

## 2018-06-23 LAB — PULMONARY FUNCTION TEST
DL/VA % pred: 116 %
DL/VA: 5.89 ml/min/mmHg/L
DLCO UNC % PRED: 44 %
DLCO unc: 11.92 ml/min/mmHg
FEF 25-75 Post: 1.63 L/sec
FEF 25-75 Pre: 0.94 L/sec
FEF2575-%CHANGE-POST: 72 %
FEF2575-%PRED-POST: 73 %
FEF2575-%Pred-Pre: 42 %
FEV1-%Change-Post: 8 %
FEV1-%PRED-POST: 49 %
FEV1-%Pred-Pre: 45 %
FEV1-POST: 1.12 L
FEV1-PRE: 1.03 L
FEV1FVC-%CHANGE-POST: 0 %
FEV1FVC-%Pred-Pre: 104 %
FEV6-%CHANGE-POST: 5 %
FEV6-%PRED-POST: 46 %
FEV6-%PRED-PRE: 44 %
FEV6-POST: 1.3 L
FEV6-PRE: 1.24 L
FEV6FVC-%Change-Post: -3 %
FEV6FVC-%PRED-PRE: 103 %
FEV6FVC-%Pred-Post: 99 %
FVC-%Change-Post: 9 %
FVC-%PRED-POST: 47 %
FVC-%Pred-Pre: 43 %
FVC-POST: 1.35 L
FVC-Pre: 1.24 L
POST FEV1/FVC RATIO: 83 %
POST FEV6/FVC RATIO: 96 %
Pre FEV1/FVC ratio: 83 %
Pre FEV6/FVC Ratio: 100 %
RV % PRED: 113 %
RV: 2.39 L
TLC % pred: 70 %
TLC: 3.79 L

## 2018-06-23 MED ORDER — ALBUTEROL SULFATE (2.5 MG/3ML) 0.083% IN NEBU
2.5000 mg | INHALATION_SOLUTION | Freq: Once | RESPIRATORY_TRACT | Status: AC
  Administered 2018-06-23: 2.5 mg via RESPIRATORY_TRACT

## 2018-06-23 MED ORDER — PERFLUTREN LIPID MICROSPHERE
1.0000 mL | INTRAVENOUS | Status: DC | PRN
  Administered 2018-06-23: 3 mL via INTRAVENOUS
  Filled 2018-06-23: qty 10

## 2018-06-23 NOTE — Progress Notes (Signed)
  Echocardiogram 2D Echocardiogram has been performed.  Darlina Sicilian M 06/23/2018, 10:53 AM

## 2018-06-28 ENCOUNTER — Telehealth: Payer: Self-pay | Admitting: Internal Medicine

## 2018-06-28 NOTE — Telephone Encounter (Signed)
Spoke with patient about results of her Echo and PFTs. Informed the patient that her Echocardiogram shows improvement but her PFTs show signs of lung disease. Recommended that she come back to the clinic for re-assessment in light of these new results. Patient agrees and states she will call scheduler for new appointment.

## 2018-06-29 NOTE — Telephone Encounter (Signed)
I'll call her back thank you. 

## 2018-06-29 NOTE — Telephone Encounter (Addendum)
Call from patient-states she spoke with Dr Truman Hayward about ECHO and PFT results and he mentioned the possibility of starting her on an inhaler.  Pt has an appt with pcp on 09/20 and would like to know if it was really necessary to come back before then to get rx for and inhaler.  She would like to have rx sent into pharmacy if possible.  Will send request to [facility] for review, please advise.Despina Hidden Cassady8/7/20199:39 AM   CC: PCP

## 2018-06-30 ENCOUNTER — Telehealth: Payer: Self-pay | Admitting: Internal Medicine

## 2018-06-30 NOTE — Telephone Encounter (Signed)
Left voicemail letting the patient know that we can wait until the 9/20 to address her respiratory symptoms if she feels like her symptoms are improving now that she has been tobacco free for a month.

## 2018-07-01 NOTE — Progress Notes (Signed)
Internal Medicine Clinic Attending  I saw and evaluated the patient.  I personally confirmed the key portions of the history and exam documented by Dr. Lee and I reviewed pertinent patient test results.  The assessment, diagnosis, and plan were formulated together and I agree with the documentation in the resident's note.  Alexander Raines, M.D., Ph.D.  

## 2018-08-12 ENCOUNTER — Encounter: Payer: Self-pay | Admitting: Internal Medicine

## 2018-08-12 ENCOUNTER — Ambulatory Visit (INDEPENDENT_AMBULATORY_CARE_PROVIDER_SITE_OTHER): Payer: Medicare Other | Admitting: Internal Medicine

## 2018-08-12 VITALS — BP 128/69 | HR 59 | Temp 98.0°F | Wt 216.9 lb

## 2018-08-12 DIAGNOSIS — Z87448 Personal history of other diseases of urinary system: Secondary | ICD-10-CM

## 2018-08-12 DIAGNOSIS — Z23 Encounter for immunization: Secondary | ICD-10-CM | POA: Diagnosis not present

## 2018-08-12 DIAGNOSIS — Z794 Long term (current) use of insulin: Secondary | ICD-10-CM

## 2018-08-12 DIAGNOSIS — I5022 Chronic systolic (congestive) heart failure: Secondary | ICD-10-CM | POA: Diagnosis not present

## 2018-08-12 DIAGNOSIS — R3989 Other symptoms and signs involving the genitourinary system: Secondary | ICD-10-CM | POA: Diagnosis not present

## 2018-08-12 DIAGNOSIS — Z720 Tobacco use: Secondary | ICD-10-CM

## 2018-08-12 DIAGNOSIS — Z79899 Other long term (current) drug therapy: Secondary | ICD-10-CM

## 2018-08-12 DIAGNOSIS — E785 Hyperlipidemia, unspecified: Secondary | ICD-10-CM | POA: Diagnosis not present

## 2018-08-12 DIAGNOSIS — I700 Atherosclerosis of aorta: Secondary | ICD-10-CM

## 2018-08-12 DIAGNOSIS — E113393 Type 2 diabetes mellitus with moderate nonproliferative diabetic retinopathy without macular edema, bilateral: Secondary | ICD-10-CM

## 2018-08-12 DIAGNOSIS — Z7982 Long term (current) use of aspirin: Secondary | ICD-10-CM

## 2018-08-12 DIAGNOSIS — I2510 Atherosclerotic heart disease of native coronary artery without angina pectoris: Secondary | ICD-10-CM | POA: Diagnosis not present

## 2018-08-12 DIAGNOSIS — I110 Hypertensive heart disease with heart failure: Secondary | ICD-10-CM | POA: Diagnosis not present

## 2018-08-12 DIAGNOSIS — F17200 Nicotine dependence, unspecified, uncomplicated: Secondary | ICD-10-CM

## 2018-08-12 DIAGNOSIS — E669 Obesity, unspecified: Secondary | ICD-10-CM

## 2018-08-12 DIAGNOSIS — E113399 Type 2 diabetes mellitus with moderate nonproliferative diabetic retinopathy without macular edema, unspecified eye: Secondary | ICD-10-CM | POA: Diagnosis not present

## 2018-08-12 DIAGNOSIS — I10 Essential (primary) hypertension: Secondary | ICD-10-CM

## 2018-08-12 DIAGNOSIS — E7800 Pure hypercholesterolemia, unspecified: Secondary | ICD-10-CM

## 2018-08-12 DIAGNOSIS — Z6833 Body mass index (BMI) 33.0-33.9, adult: Secondary | ICD-10-CM

## 2018-08-12 LAB — GLUCOSE, CAPILLARY: Glucose-Capillary: 119 mg/dL — ABNORMAL HIGH (ref 70–99)

## 2018-08-12 LAB — POCT URINALYSIS DIPSTICK
BILIRUBIN UA: NEGATIVE
Blood, UA: NEGATIVE
Glucose, UA: POSITIVE — AB
Ketones, UA: NEGATIVE
Nitrite, UA: NEGATIVE
PH UA: 7 (ref 5.0–8.0)
Protein, UA: POSITIVE — AB
Spec Grav, UA: 1.02 (ref 1.010–1.025)
UROBILINOGEN UA: 1 U/dL

## 2018-08-12 LAB — POCT GLYCOSYLATED HEMOGLOBIN (HGB A1C): Hemoglobin A1C: 10.8 % — AB (ref 4.0–5.6)

## 2018-08-12 MED ORDER — BYDUREON 2 MG ~~LOC~~ SRER: 1.0000 mL | SUBCUTANEOUS | 3 refills | Status: DC

## 2018-08-12 MED ORDER — AMLODIPINE BESYLATE 10 MG PO TABS: 10.0000 mg | ORAL_TABLET | Freq: Every day | ORAL | 3 refills | Status: DC

## 2018-08-12 MED ORDER — INSULIN LISPRO 100 UNIT/ML (KWIKPEN): PEN_INJECTOR | SUBCUTANEOUS | 11 refills | Status: DC

## 2018-08-12 NOTE — Patient Instructions (Signed)
As always it was great to see you!  Give my best to your granddaughter.  1) Increase the mealtime insulin to 18 units at breakfast and lunch and 20 mg at dinner.  2) We stopped the canagliflozin.  3)  Work on crystal light and stopping the sodas.  4) Work on changing the sugar to the substitute.  5) Decrease your breads as best you can.  The same goes for potatoes.  6) You do not need an inhaler.  7) Keep taking all of you other medications as you are doing.  8) Let us know when you are ready to quit smoking.  We may be able to help.  I will see you back in 3 months, sooner if necessary.

## 2018-08-12 NOTE — Assessment & Plan Note (Signed)
Assessment  Her dyspnea on exertion has improved with the increase in the Entresto to twice daily.  She has been compliant with her carvedilol 12.5 mg by mouth twice daily and Lasix 40 mg by mouth twice daily.  Plan  We will continue with the carvedilol 12.5 mg by mouth twice daily, Lasix 40 mg by mouth twice daily, and Ernesto 1 tablet twice daily.  We will reassess the efficacy of this therapy in maintaining symptom control of her chronic systolic heart failure at the follow-up visit.

## 2018-08-12 NOTE — Assessment & Plan Note (Signed)
Assessment  Since the last visit she put on 5 pounds.  She admits to not following her diet whatsoever.  Plan  We had a long discussion about the importance of portion control, regular exercise, and in her case, avoidance of high calorie sugary drinks and limitations in her carbohydrate intake.  She was given several ideas which she felt were good and will try to implement them prior to the follow-up visit.  Upon return, we will reassess the efficacy of these lifestyle interventions with a repeat weight.

## 2018-08-12 NOTE — Assessment & Plan Note (Signed)
Assessment  She has had no chest pain from her nonobstructive coronary artery disease.  Her current regimen includes aspirin 81 mg by mouth daily, atorvastatin 40 mg by mouth daily, carvedilol 12.5 mg by mouth twice daily, and amlodipine 10 mg by mouth daily.  Plan  We will continue her current antianginal regimen including aspirin 81 mg by mouth daily, atorvastatin 40 mg by mouth daily, carvedilol 12.5 mg by mouth twice daily, and amlodipine 10 mg by mouth daily.  We will reassess the efficacy of this therapy in maintaining a non-anginal state at the follow-up visit.

## 2018-08-12 NOTE — Assessment & Plan Note (Signed)
Assessment  She denies any claudication with ambulation.  Plan  We will continue with the aspirin therapy at 81 mg by mouth daily, atorvastatin at 40 mg by mouth daily, and aggressive risk factor modification including management of her diabetes and hypertension.  At each visit we will encourage her to quit smoking and offer her pharmacologic aids to help her do so.

## 2018-08-12 NOTE — Assessment & Plan Note (Signed)
Assessment  We assessed for evidence of a recurrent urinary tract infection which we thought was the cause of her pneumaturia.  A urine dipstick was notable for glucose, protein, and trace leukocytes without nitrites.  Plan  This is not felt to represent a significant urinary tract infection and antibiotic therapy was not provided.

## 2018-08-12 NOTE — Progress Notes (Signed)
108

## 2018-08-12 NOTE — Assessment & Plan Note (Addendum)
Assessment  Her blood pressure today was 128/69 which is at target.  This is on carvedilol 12.5 mg twice daily, amlodipine 10 mg by mouth daily, Lasix 40 mg by mouth twice daily, and Ernesto 1 tablet twice daily.  She is tolerating this regimen well.  Plan  We will continue the carvedilol at 12.5 mg by mouth twice daily, amlodipine 10 mg by mouth daily, Lasix 40 mg by mouth twice daily, and Ernesto 1 tablet twice daily.  We will reassess the efficacy of this therapy in controlling her blood pressure at the follow-up visit.

## 2018-08-12 NOTE — Progress Notes (Signed)
   Subjective:    Patient ID: Alicia [name], female    DOB: [birth year], 62 y.o.   MRN: [id number]  HPI  Alicia [name] is here for follow-up of her type 2 diabetes with moderate non-proliferative diabetic retinopathy, essential hypertension, tobacco use disorder, chronic systolic heart failure, coronary artery disease without angina, hyperlipidemia, aortic atherosclerosis, obesity, and history of pneumaturia. Please see the A&P for the status of the pt's chronic medical problems.  Review of Systems  Constitutional: Negative for activity change, appetite change and unexpected weight change.  Respiratory: Negative for chest tightness, shortness of breath and wheezing.   Cardiovascular: Negative for chest pain, palpitations and leg swelling.  Gastrointestinal: Negative for abdominal distention, abdominal pain, constipation, diarrhea, nausea and vomiting.  Musculoskeletal: Positive for back pain. Negative for arthralgias, joint swelling and myalgias.  Skin: Negative for rash and wound.      Objective:   Physical Exam  Constitutional: She is oriented to person, place, and time. She appears well-developed and well-nourished. No distress.  HENT:  Head: Normocephalic and atraumatic.  Eyes: Conjunctivae are normal. Right eye exhibits no discharge. Left eye exhibits no discharge. No scleral icterus.  Cardiovascular: Normal rate, regular rhythm and normal heart sounds. Exam reveals no gallop and no friction rub.  No murmur heard. Pulmonary/Chest: Effort normal and breath sounds normal. No stridor. No respiratory distress. She has no wheezes. She has no rales.  Musculoskeletal: Normal range of motion. She exhibits no edema, tenderness or deformity.  Neurological: She is alert and oriented to person, place, and time. She exhibits normal muscle tone.  Skin: Skin is warm and dry. No rash noted. She is not diaphoretic.  Psychiatric: She has a normal mood and affect. Her behavior is normal. Judgment and  thought content normal.  Nursing note and vitals reviewed.     Assessment & Plan:   Please see problem based charting.

## 2018-08-12 NOTE — Assessment & Plan Note (Signed)
Assessment  She is tolerating the atorvastatin 40 mg by mouth daily without myalgias.  Plan  We will continue with this high intensity statin and reassess for intolerances at the follow-up visit.

## 2018-08-12 NOTE — Assessment & Plan Note (Signed)
Assessment  Unfortunately she continues to smoke.  She remains in the pre-contemplative stage at this point.  Plan  She was reminded she could call us when she was ready to quit smoking as we may be able to help with some pharmacologic intervention.  We will reassess her willingness to quit smoking at the follow-up visit.

## 2018-08-12 NOTE — Assessment & Plan Note (Signed)
Assessment  Her hemoglobin A1c remains well above target today at 10.8.  It is only decreased minimally as it was 10.9 approximately 3 months ago.  Her current regimen includes Bydureon 1 mL weekly, Lantus 50 units subcu every morning, Humalog 18 units before breakfast, lunch, and dinner, and metformin XL 1000 mg twice daily.  She was unable to tolerate the Canagliflozin because it caused anxiety, palpitations, and itching.  She therefore has not been taking this medication.  Her weight is also up 5 pounds.  Upon review of her downloaded meter readings it is seen that her sugars are the highest at dinnertime.  Therefore, it seems like that would be the most efficacious target in hopes of improving her glycemic control.  She is up-to-date on her diabetic retinal examinations and her hypertension is well controlled.  Plan  We will continue the Bydureon 1 mL weekly, Lantus 50 units subcu every morning, Humalog 18 units before breakfast and lunch and increase the dinner dose to 20 units.  She will also continue the metformin XL 1000 mg twice a day.  We had a long discussion about sugar substitutes and she plans on picking up a bag before returning home from clinic today.  We also discussed portion control and the importance of carbohydrates in worsening her diabetes and weight.  She loves breads and potatoes but will try to limit the portion sizes of these carbohydrates.  Finally, she did not take my prior advice to stop the sugary colas but now is willing to change completely to Crystal light in order to avoid the sugar and calories associated with the sodas.  We will reassess the efficacy of these changes in improving her hemoglobin A1c at the follow-up visit.  She will bring her meter at that time so that we can reevaluate her trends.  She will follow-up with her ophthalmologist in March 2020 where she is next due for retinal examination.  In the meantime, we are aggressively trying to improve her glycemic  control as well as maintain normotension.

## 2018-08-30 ENCOUNTER — Encounter: Payer: Self-pay | Admitting: Cardiology

## 2018-08-30 ENCOUNTER — Ambulatory Visit (INDEPENDENT_AMBULATORY_CARE_PROVIDER_SITE_OTHER): Payer: Medicare Other | Admitting: Cardiology

## 2018-08-30 VITALS — BP 150/80 | HR 81 | Ht 67.0 in | Wt 216.8 lb

## 2018-08-30 DIAGNOSIS — I10 Essential (primary) hypertension: Secondary | ICD-10-CM | POA: Diagnosis not present

## 2018-08-30 DIAGNOSIS — E119 Type 2 diabetes mellitus without complications: Secondary | ICD-10-CM

## 2018-08-30 DIAGNOSIS — I5022 Chronic systolic (congestive) heart failure: Secondary | ICD-10-CM | POA: Diagnosis not present

## 2018-08-30 MED ORDER — CARVEDILOL 25 MG PO TABS: 25.0000 mg | ORAL_TABLET | Freq: Two times a day (BID) | ORAL | 3 refills | Status: DC
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
## 2018-08-30 NOTE — Patient Instructions (Signed)
Medication Instructions:  Please increase your Carvedilol to 25 mg twice a day. Continue all other medications as listed.  If you need a refill on your cardiac medications before your next appointment, please call your pharmacy.   Follow-Up: At [facility], you and your health needs are our priority.  As part of our continuing mission to provide you with exceptional heart care, we have created designated Provider Care Teams.  These Care Teams include your primary Cardiologist (physician) and Advanced Practice Providers (APPs -  Physician Assistants and Nurse Practitioners) who all work together to provide you with the care you need, when you need it. You will need a follow up appointment in 6 months with Robbie Lis, PA.  Please call our office 2 months in advance to schedule this appointment.   Follow up in 1 year with Dr Marlou Porch.  Thank you for choosing [facility]!!

## 2018-08-31 ENCOUNTER — Encounter: Payer: Self-pay | Admitting: Internal Medicine

## 2018-08-31 ENCOUNTER — Ambulatory Visit (INDEPENDENT_AMBULATORY_CARE_PROVIDER_SITE_OTHER): Payer: Medicare Other | Admitting: Internal Medicine

## 2018-08-31 ENCOUNTER — Telehealth: Payer: Self-pay | Admitting: Dietician

## 2018-08-31 DIAGNOSIS — L02211 Cutaneous abscess of abdominal wall: Secondary | ICD-10-CM

## 2018-08-31 DIAGNOSIS — L03311 Cellulitis of abdominal wall: Secondary | ICD-10-CM

## 2018-08-31 DIAGNOSIS — L0291 Cutaneous abscess, unspecified: Secondary | ICD-10-CM

## 2018-08-31 HISTORY — DX: Cutaneous abscess of abdominal wall: L02.211

## 2018-08-31 MED ORDER — DOXYCYCLINE HYCLATE 100 MG PO TABS: 100.0000 mg | ORAL_TABLET | Freq: Two times a day (BID) | ORAL | 0 refills | Status: DC

## 2018-08-31 NOTE — Assessment & Plan Note (Signed)
Patient is here for evaluation of a large right lower quadrant abdominal wall abscess. Patient is prone to abscesses, has required I&D in the past. On exam she has a 6-7 cm abscess with a central head and surrounding cellulitis. She has agreed to I&D. Will also provide a short course of antibiotics.  -- I&D -- Doxy 100 mg BID x 5 days -- Follow up 1 week to remove packing   Procedure Note  Indication:  I&D for abscess    Operators: Dr. Philipp Ovens   Patient consented to incision and drainage of her right abdominal wall abscess.  After a time out was preformed, the area was prepped in a sterile fashion. Area was numbed with 1% lidocaine prior to incision. An 11 blade scalpel was use to make a 1 cm incision over the abscess. Approximately 500 cc of purulent material was drained from the incision. The incision was packed and dressed with gauze following the procedure. The patient tolerated the procedure well without complication.

## 2018-08-31 NOTE — Progress Notes (Signed)
Internal Medicine Clinic Attending  I saw and evaluated the patient.  I personally confirmed the key portions of the history and exam documented by Dr. Philipp Ovens and I reviewed pertinent patient test results.  The assessment, diagnosis, and plan were formulated together and I agree with the documentation in the resident's note.  I supervised the I and D with Dr. Philipp Ovens.

## 2018-08-31 NOTE — Patient Instructions (Signed)
Alicia [name],  It was a pleasure to see you. Today we drained an infection on your abdomen. Please take doxycyline, an antibiotic, twice a day for the next 5 days. Return to clinic on Monday for follow up. If you have any questions or concerns, call our clinic at [phone] or after hours call [phone] and ask for the internal medicine resident on call. Thank you!  Dr. Philipp Ovens

## 2018-08-31 NOTE — Progress Notes (Signed)
   CC: Abscess   HPI:  [name] is a 62 y.o. female with past medical history outlined below here for abscess . For the details of today's visit, please refer to the assessment and plan.  Past Medical History:  Diagnosis Date  . Acquired lactose intolerance 09/24/2017  . 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  Non-functioning  . Aortic atherosclerosis ([place]) 09/24/2017   Asymptomatic, found on CT scan  . Blood transfusion without reported diagnosis   . Chronic Systolic Heart Failure 8/67/6720   Felt to be non-ischemic and secondary to hypertension.  Echo (05/28/2014): LVEF 25%.  Is not interested in AICD placement.  Marland Kitchen COPD exacerbation ([place])   . Coronary artery disease involving native coronary artery of native heart without angina pectoris 11/09/2014   Cardiac cath (02/18/2014): Non-obstructive, mRCA 30%, dRCA 60%  . 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 Diverticulosis of colon 09/24/2017  . Essential hypertension 10/15/2013  . Gastroesophageal reflux disease 09/24/2017  . History of cerebrovascular accident 05/10/2013   Per patient report 6 previous strokes, most recent on 05/10/13.  No residual deficits.  . Hyperlipidemia   . Overweight (BMI 25.0-29.9) 09/24/2017  . Psoriasis 09/24/2017  . Seasonal allergic rhinitis due to pollen 09/24/2017   Spring and early Fall  . Small Bowel Obstruction (SBO) 01/13/2014   Ex-Lap & Lysis of Adhesion, 01/15/2014  . Tobacco use disorder 01/15/2014  . Type 2 diabetes mellitus with moderate nonproliferative diabetic retinopathy ([place]) 10/15/2013    Review of Systems  Constitutional: Negative for chills and fever.  Gastrointestinal: Positive for abdominal pain.    Physical Exam:  Vitals:   08/31/18 0850  BP: 119/62  Pulse: 60  Temp: 98.7 F (37.1 C)  TempSrc: Oral  SpO2: 97%  Weight: 218 lb 8 oz (99.1 kg)     Constitutional: NAD, appears comfortable Pulmonary/Chest: Breathing comfortably, normal RR Abdominal: Large right lower quadrent abscess with central head and surrounding erythema & induration   Skin: No rashes or erythema  Psychiatric: Normal mood and affect  Assessment & Plan:   See Encounters Tab for problem based charting.  Patient seen with Dr. Dareen Piano

## 2018-09-05 ENCOUNTER — Ambulatory Visit: Payer: Medicare Other | Admitting: Internal Medicine

## 2018-09-05 ENCOUNTER — Encounter: Payer: Self-pay | Admitting: Internal Medicine

## 2018-09-05 VITALS — BP 147/80 | HR 68 | Temp 98.9°F | Ht 66.5 in | Wt 217.1 lb

## 2018-09-05 DIAGNOSIS — L0291 Cutaneous abscess, unspecified: Secondary | ICD-10-CM

## 2018-09-05 DIAGNOSIS — B370 Candidal stomatitis: Secondary | ICD-10-CM | POA: Insufficient documentation

## 2018-09-05 DIAGNOSIS — E113393 Type 2 diabetes mellitus with moderate nonproliferative diabetic retinopathy without macular edema, bilateral: Secondary | ICD-10-CM

## 2018-09-05 MED ORDER — NYSTATIN 100000 UNIT/ML MT SUSP: 5.0000 mL | Freq: Four times a day (QID) | OROMUCOSAL | 0 refills | Status: AC

## 2018-09-05 NOTE — Assessment & Plan Note (Signed)
I&D of RLQ abscess one week ago. She has a history of recurrent abscesses. She states she has not changed the packing but is keeping the area clean. She denies pain, purulent drainage, fever or chills. She has been having nausea from the doxycycline, which has caused nausea for her when she has taken it previously. She completed the course of antibiotics yesterday.   - repack wound w/dry iodoform  - return in 4 days to repack

## 2018-09-05 NOTE — Progress Notes (Signed)
   CC: f/u RLQ skin abscess  HPI:  [name] is a 62 y.o. with PMH as below presenting for assessment s/p I&D one week ago of RLQ abdominal abscess, assessment of her glucose levels with glucometer, and also she may have thrush.   Please see A&P for assessment of the patient's chronic medical conditions.    Past Medical History:  Diagnosis Date  . Acquired lactose intolerance 09/24/2017  . 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  Non-functioning  . Aortic atherosclerosis ([place]) 09/24/2017   Asymptomatic, found on CT scan  . Blood transfusion without reported diagnosis   . Chronic Systolic Heart Failure 06/01/6268   Felt to be non-ischemic and secondary to hypertension.  Echo (05/28/2014): LVEF 25%.  Is not interested in AICD placement.  Marland Kitchen COPD exacerbation ([place])   . Coronary artery disease involving native coronary artery of native heart without angina pectoris 11/09/2014   Cardiac cath (02/18/2014): Non-obstructive, mRCA 30%, dRCA 60%  . 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 Diverticulosis of colon 09/24/2017  . Essential hypertension 10/15/2013  . Gastroesophageal reflux disease 09/24/2017  . History of cerebrovascular accident 05/10/2013   Per patient report 6 previous strokes, most recent on 05/10/13.  No residual deficits.  . Hyperlipidemia   . Overweight (BMI 25.0-29.9) 09/24/2017  . Psoriasis 09/24/2017  . Seasonal allergic rhinitis due to pollen 09/24/2017   Spring and early Fall  . Small Bowel Obstruction (SBO) 01/13/2014   Ex-Lap & Lysis of Adhesion, 01/15/2014  . Tobacco use disorder 01/15/2014  . Type 2 diabetes mellitus with moderate nonproliferative diabetic retinopathy ([place]) 10/15/2013   Review of Systems:   Review of Systems  Constitutional: Negative for chills, diaphoresis and fever.  HENT: Negative for congestion and sore throat.   Respiratory: Negative  for cough, sputum production, shortness of breath, wheezing and stridor.   Cardiovascular: Negative for chest pain, orthopnea and leg swelling.  Gastrointestinal: Positive for nausea and vomiting. Negative for constipation and diarrhea.  Genitourinary: Negative for dysuria, frequency and urgency.  Musculoskeletal: Positive for back pain. Negative for falls and neck pain.  Neurological: Negative for dizziness, sensory change and headaches.  All other systems reviewed and are negative.    Physical Exam:  Constitution: NAD, obese, sitting comfortably HENT: intraoral buccal opaque white plaques bilaterally, moist mucous membranes, poor dentition Eyes: dysconjugate gaze, no scleral icterus Cardio: RRR, no m/r/g Respiratory: non-labored breathing Abdominal: NTTP, non-distended, soft MSK: no pitting edema  Neuro: A&Ox3  Skin: 1cm diameter x 1.5 cm depth non-purulent wound packed with iodoform on RLQ of abdomen.    Vitals:   09/05/18 0858  BP: (!) 147/80  Pulse: 68  Temp: 98.9 F (37.2 C)  TempSrc: Oral  SpO2: 99%  Weight: 217 lb 1.6 oz (98.5 kg)  Height: 5' 6.5" (1.689 m)    Assessment & Plan:   See Encounters Tab for problem based charting.  Patient seen with Dr. Daryll Drown

## 2018-09-05 NOTE — Assessment & Plan Note (Signed)
She has been keeping track of her glucose although has been checking it frequently post-prandial. She states she knows she needs to check before and states she will work on this as well as on her diet. Her glucose has been ~300 in the evening after eating. She states she has been taking her medications regularly.   - Cont. Bydureon, lantus q50U qhs, humalog 18U breakfast and lunch and 20U with dinner. She will work on checking her glucose before eating  - HbA1c at f/u in 3 days when she returns for change of dressing

## 2018-09-05 NOTE — Assessment & Plan Note (Signed)
She states she thinks she may have oral thrush from taking doxycycline. On exam she has bilateral opaque plaques along her inner buccal mucosa   - given oral nystatin mouthwash qid for five days - reassess at 4 day f/u

## 2018-09-05 NOTE — Patient Instructions (Signed)
Thank you for allowing [name] to provide your care today. Today we discussed your diabetes, oral thrush, and the healing of your abdominal abscess    Today we added nystatin mouthwash to your medications. Please rinse with 62mL four times per day for five days.    Please follow-up in 3-4 days to remove the packing from your abscess.    Should you have any questions or concerns please call the internal medicine clinic at [phone].

## 2018-09-07 NOTE — Telephone Encounter (Signed)
Invited to diabetes group meeting tomorrow

## 2018-09-09 ENCOUNTER — Other Ambulatory Visit: Payer: Self-pay

## 2018-09-09 ENCOUNTER — Ambulatory Visit (INDEPENDENT_AMBULATORY_CARE_PROVIDER_SITE_OTHER): Payer: Medicare Other | Admitting: Internal Medicine

## 2018-09-09 VITALS — BP 134/66 | HR 59 | Temp 98.1°F | Ht 66.5 in | Wt 220.6 lb

## 2018-09-09 DIAGNOSIS — L02211 Cutaneous abscess of abdominal wall: Secondary | ICD-10-CM

## 2018-09-09 DIAGNOSIS — E113393 Type 2 diabetes mellitus with moderate nonproliferative diabetic retinopathy without macular edema, bilateral: Secondary | ICD-10-CM

## 2018-09-09 DIAGNOSIS — L0291 Cutaneous abscess, unspecified: Secondary | ICD-10-CM

## 2018-09-09 DIAGNOSIS — K219 Gastro-esophageal reflux disease without esophagitis: Secondary | ICD-10-CM

## 2018-09-09 DIAGNOSIS — R110 Nausea: Secondary | ICD-10-CM

## 2018-09-09 DIAGNOSIS — E113399 Type 2 diabetes mellitus with moderate nonproliferative diabetic retinopathy without macular edema, unspecified eye: Secondary | ICD-10-CM

## 2018-09-09 DIAGNOSIS — F17200 Nicotine dependence, unspecified, uncomplicated: Secondary | ICD-10-CM

## 2018-09-09 NOTE — Assessment & Plan Note (Signed)
Patient left before HbA1c could be obtained.  - HbA1c next week

## 2018-09-09 NOTE — Progress Notes (Signed)
   CC: abscess   HPI:  [name] is a 62 y.o. with PMH as below presenting to remove packing from RLQ cutaneous abscess drained 1.5 weeks ago.   Please see A&P for assessment of the patient's chronic medical conditions.   Past Medical History:  Diagnosis Date  . Acquired lactose intolerance 09/24/2017  . 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  Non-functioning  . Aortic atherosclerosis ([place]) 09/24/2017   Asymptomatic, found on CT scan  . Blood transfusion without reported diagnosis   . Chronic Systolic Heart Failure 2/86/3817   Felt to be non-ischemic and secondary to hypertension.  Echo (05/28/2014): LVEF 25%.  Is not interested in AICD placement.  Marland Kitchen COPD exacerbation ([place])   . Coronary artery disease involving native coronary artery of native heart without angina pectoris 11/09/2014   Cardiac cath (02/18/2014): Non-obstructive, mRCA 30%, dRCA 60%  . 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 Diverticulosis of colon 09/24/2017  . Essential hypertension 10/15/2013  . Gastroesophageal reflux disease 09/24/2017  . History of cerebrovascular accident 05/10/2013   Per patient report 6 previous strokes, most recent on 05/10/13.  No residual deficits.  . Hyperlipidemia   . Overweight (BMI 25.0-29.9) 09/24/2017  . Psoriasis 09/24/2017  . Seasonal allergic rhinitis due to pollen 09/24/2017   Spring and early Fall  . Small Bowel Obstruction (SBO) 01/13/2014   Ex-Lap & Lysis of Adhesion, 01/15/2014  . Tobacco use disorder 01/15/2014  . Type 2 diabetes mellitus with moderate nonproliferative diabetic retinopathy ([place]) 10/15/2013   Review of Systems:   Review of Systems  Gastrointestinal: Positive for nausea. Negative for abdominal pain, constipation, diarrhea and vomiting.  Skin: Negative for itching and rash.  small amount of drainage from healing drained abscess, no pain or  tenderness   Physical Exam:  Constitution: NAD, obese Respiratory: non-labored breathing Abdominal: Non-distended, NTTP, soft Skin: lower right quandrant 1cm x 1 cm deep healing drained abscess, packing in place, minimal drainage, +surrounding induration, no erythema, purulence, or tenderness   Vitals:   09/09/18 0921  BP: 134/66  Pulse: (!) 59  Temp: 98.1 F (36.7 C)  TempSrc: Oral  SpO2: 94%  Weight: 220 lb 9.6 oz (100.1 kg)  Height: 5' 6.5" (1.689 m)     Assessment & Plan:   See Encounters Tab for problem based charting.  Patient seen with Dr. Rebeca Alert

## 2018-09-09 NOTE — Patient Instructions (Signed)
Thank you for allowing [name] to provide your care today. Today we discussed drained abscess. We did not repack the area today as it appears shallow enough to heal from the inside out on it's own. Please continue to keep the area covered and clean.   Please follow-up in one week.    Should you have any questions or concerns please call the internal medicine clinic at [phone].

## 2018-09-09 NOTE — Assessment & Plan Note (Signed)
RLQ cutaneous abscess, healing well, no purulence, erythema or tenderness. The wound is shallower than three days ago. We will keep her bandage in place, but I do not think it needs more packing at this time.   - f/u in one week to assure healing.

## 2018-09-09 NOTE — Assessment & Plan Note (Signed)
Patient states that for the past year she often vomits a small amount in the morning, usually clear fluid but states it tastes bitter. She usually avoids eating before bed the night before and states she is used to this and it does not bother her. She states she has no upper abdominal or sternal pain and does not have symptoms of heart burn. She is having regular BM as well and last BM was this morning. She is a smoker and usually smokes before these episodes and states it makes them not as a bad.   - consider adding PPI at future visit.

## 2018-09-13 NOTE — Progress Notes (Signed)
Internal Medicine Clinic Attending  I saw and evaluated the patient.  I personally confirmed the key portions of the history and exam documented by Dr. Sharon Seller and I reviewed pertinent patient test results.  The assessment, diagnosis, and plan were formulated together and I agree with the documentation in the resident's note.  Abscess is slowly healing, now more of a crater, no deep pocket that requires packing.  We did not discuss her morning vomiting. Would want to inquire further about these episodes, but if these are truly new onset episodes of reflux, may need referral to GI for EGD given age >40.  Lenice Pressman, M.D., Ph.D.

## 2018-09-13 NOTE — Progress Notes (Signed)
Internal Medicine Clinic Attending  I saw and evaluated the patient.  I personally confirmed the key portions of the history and exam documented by Dr. Seawell and I reviewed pertinent patient test results.  The assessment, diagnosis, and plan were formulated together and I agree with the documentation in the resident's note.     

## 2018-09-16 ENCOUNTER — Ambulatory Visit (INDEPENDENT_AMBULATORY_CARE_PROVIDER_SITE_OTHER): Payer: Medicare Other | Admitting: Internal Medicine

## 2018-09-16 ENCOUNTER — Other Ambulatory Visit: Payer: Self-pay

## 2018-09-16 VITALS — BP 128/58 | HR 65 | Temp 98.2°F | Ht 65.5 in | Wt 218.9 lb

## 2018-09-16 DIAGNOSIS — E113393 Type 2 diabetes mellitus with moderate nonproliferative diabetic retinopathy without macular edema, bilateral: Secondary | ICD-10-CM | POA: Diagnosis not present

## 2018-09-16 DIAGNOSIS — K219 Gastro-esophageal reflux disease without esophagitis: Secondary | ICD-10-CM

## 2018-09-16 DIAGNOSIS — E113399 Type 2 diabetes mellitus with moderate nonproliferative diabetic retinopathy without macular edema, unspecified eye: Secondary | ICD-10-CM | POA: Diagnosis not present

## 2018-09-16 DIAGNOSIS — Z6835 Body mass index (BMI) 35.0-35.9, adult: Secondary | ICD-10-CM

## 2018-09-16 DIAGNOSIS — E669 Obesity, unspecified: Secondary | ICD-10-CM

## 2018-09-16 DIAGNOSIS — Z794 Long term (current) use of insulin: Secondary | ICD-10-CM

## 2018-09-16 DIAGNOSIS — L02211 Cutaneous abscess of abdominal wall: Secondary | ICD-10-CM | POA: Diagnosis not present

## 2018-09-16 LAB — POCT GLYCOSYLATED HEMOGLOBIN (HGB A1C): Hemoglobin A1C: 9.9 % — AB (ref 4.0–5.6)

## 2018-09-16 LAB — GLUCOSE, CAPILLARY: Glucose-Capillary: 182 mg/dL — ABNORMAL HIGH (ref 70–99)

## 2018-09-16 MED ORDER — OMEPRAZOLE 20 MG PO CPDR: 20.0000 mg | DELAYED_RELEASE_CAPSULE | Freq: Every day | ORAL | 0 refills | Status: DC

## 2018-09-16 NOTE — Assessment & Plan Note (Signed)
HbA1c today 9.9, improved from 10.8. We will continue her current therapy. We discussed the importance of taking her insulin at mealtime.   - f/u for repeat HbA1c in 3 months  - cont. bydureon 2 mg, metformin 1000 mg bid, lantus 50U qhs, and humalog 18U before breakfast and lunch and 20U before dinner

## 2018-09-16 NOTE — Assessment & Plan Note (Signed)
Previous site of abscess is healing well with very shallow scab, no drainage, erythema or warmth. Area is non-tender and she states she has been keeping it very clean. No f/u needed.

## 2018-09-16 NOTE — Progress Notes (Addendum)
   CC: f/u abscess drainage   HPI:  Alicia [name] is a 62 y.o. with PMH as below presenting today for f/u for her recent abdominal skin abscess.   Please see A&P for assessment of the patient's chronic medical conditions.   Past Medical History:  Diagnosis Date  . Acquired lactose intolerance 09/24/2017  . 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  Non-functioning  . Aortic atherosclerosis ([place]) 09/24/2017   Asymptomatic, found on CT scan  . Blood transfusion without reported diagnosis   . Chronic Systolic Heart Failure 4/31/5400   Felt to be non-ischemic and secondary to hypertension.  Echo (05/28/2014): LVEF 25%.  Is not interested in AICD placement.  Marland Kitchen COPD exacerbation ([place])   . Coronary artery disease involving native coronary artery of native heart without angina pectoris 11/09/2014   Cardiac cath (02/18/2014): Non-obstructive, mRCA 30%, dRCA 60%  . 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 Diverticulosis of colon 09/24/2017  . Essential hypertension 10/15/2013  . Gastroesophageal reflux disease 09/24/2017  . History of cerebrovascular accident 05/10/2013   Per patient report 6 previous strokes, most recent on 05/10/13.  No residual deficits.  . Hyperlipidemia   . Overweight (BMI 25.0-29.9) 09/24/2017  . Psoriasis 09/24/2017  . Seasonal allergic rhinitis due to pollen 09/24/2017   Spring and early Fall  . Small Bowel Obstruction (SBO) 01/13/2014   Ex-Lap & Lysis of Adhesion, 01/15/2014  . Tobacco use disorder 01/15/2014  . Type 2 diabetes mellitus with moderate nonproliferative diabetic retinopathy ([place]) 10/15/2013   Review of Systems:   Review of Systems  Constitutional: Positive for weight loss (weight gain). Negative for chills and fever.  Gastrointestinal: Positive for heartburn and nausea. Negative for abdominal pain, blood in stool, constipation and diarrhea.  Skin:  no pain in area of abscess   Physical Exam:  Constitution: NAD, pleasant Cardio: RRR, no m/r/g Abdominal: soft, non-distended Skin: RLQ previous abscess almost healed, thin scab without drainage or surrounding erythema or warmth   Vitals:   09/16/18 0918  BP: (!) 128/58  Pulse: 65  Temp: 98.2 F (36.8 C)  TempSrc: Oral  SpO2: 93%  Weight: 218 lb 14.4 oz (99.3 kg)  Height: 5' 5.5" (1.664 m)     Assessment & Plan:   See Encounters Tab for problem based charting.  Patient seen with Dr. Beryle Beams   Medicine attending: I personally interviewed and briefly examined this patient on the day of the patient visit and reviewed pertinent clinical and laboratory data  with resident physician Dr. Molli Hazard and we discussed a management plan.  Murriel Hopper, MD, [place]  Hematology-Oncology/Internal Medicine

## 2018-09-16 NOTE — Assessment & Plan Note (Signed)
Discussed her early morning symptoms of clear, bitter reflux. She states she has been dealing with this for a long time and believes this is not an issue. She denies black stool or other instances of nausea and vomiting except in the am. She denies recent weight loss. I explained that her symptoms could indicate an ulcer and that she may need an EGD, but she stated she did not want any more tubes. We discussed life style modifications and she is open to decreasing coffee if necessary. She states she tries not to eat before bed. After further discussion she was agreeable to starting a PPI   - start omeprazole 20 mg qd for four weeks - instructed patient to call if symptoms return or persist after treatment

## 2018-09-16 NOTE — Assessment & Plan Note (Addendum)
She is concerned that she has continued to gain weight over the past year, and it appears she has gained about 35 lbs per our records. She states she has been trying to walk more but will continue to work on exercise and eating. She did not want to meet with RD today. We discussed that she likely needed to walk at least 30 minutes per day 5 days a week, cut back on fatty foods and sugar, and to increase her vegetable intake. She stated understanding of this and plans on continuing to work on diet and exercise.   - this is very likely dietary although she has gained a significant amount of weight over the last year. Would consider further workup if she continues to gain weight this rapidly.

## 2018-09-16 NOTE — Patient Instructions (Signed)
Thank you for allowing [name] to provide your care today. Today we discussed Type II diabetes and Gastroesophageal Reflux.   Your hemoglobin A1C has decreased from 10.8 to 9.9. Please continue to take your diabetes medications and to cut back on sugar as you have been.     Today we made the following changes to your medications:  For your GERD, please start Omeprazole (Prilosec) 20 mg one tablet per day for the next four weeks. If your symptoms persist after this trial of medication, you should request a refill to continue taking it.   Please follow-up in three months.    Should you have any questions or concerns please call the internal medicine clinic at [phone].

## 2018-09-21 ENCOUNTER — Other Ambulatory Visit: Payer: Self-pay | Admitting: Cardiology

## 2018-09-21 NOTE — Telephone Encounter (Signed)
Outpatient Medication Detail    Disp Refills Start End   furosemide (LASIX) 40 MG tablet 180 tablet 3 04/26/2018    Sig - Route: Take 1 tablet (40 mg total) by mouth 2 (two) times daily. - Oral   Sent to pharmacy as: furosemide (LASIX) 40 MG tablet   E-Prescribing Status: Receipt confirmed by pharmacy (04/26/2018 11:08 AM EDT)   Pharmacy   [id number] - [place], [place] - [address]

## 2018-10-13 ENCOUNTER — Other Ambulatory Visit: Payer: Self-pay | Admitting: Cardiology

## 2018-10-14 ENCOUNTER — Other Ambulatory Visit: Payer: Self-pay

## 2018-10-14 ENCOUNTER — Inpatient Hospital Stay (HOSPITAL_COMMUNITY)
Admission: AD | Admit: 2018-10-14 | Discharge: 2018-10-19 | DRG: 189 | Disposition: A | Discharge status: Home / Self Care | Payer: Medicare Other | Attending: Internal Medicine | Admitting: Internal Medicine

## 2018-10-14 ENCOUNTER — Inpatient Hospital Stay (HOSPITAL_COMMUNITY): Payer: Medicare Other

## 2018-10-14 ENCOUNTER — Ambulatory Visit (INDEPENDENT_AMBULATORY_CARE_PROVIDER_SITE_OTHER): Payer: Medicare Other | Admitting: Internal Medicine

## 2018-10-14 VITALS — BP 122/56 | HR 50 | Temp 97.4°F | Ht 65.5 in | Wt 225.1 lb

## 2018-10-14 DIAGNOSIS — Z6836 Body mass index (BMI) 36.0-36.9, adult: Secondary | ICD-10-CM

## 2018-10-14 DIAGNOSIS — I2510 Atherosclerotic heart disease of native coronary artery without angina pectoris: Secondary | ICD-10-CM | POA: Diagnosis present

## 2018-10-14 DIAGNOSIS — I5022 Chronic systolic (congestive) heart failure: Secondary | ICD-10-CM | POA: Diagnosis not present

## 2018-10-14 DIAGNOSIS — Z833 Family history of diabetes mellitus: Secondary | ICD-10-CM | POA: Diagnosis not present

## 2018-10-14 DIAGNOSIS — I110 Hypertensive heart disease with heart failure: Secondary | ICD-10-CM | POA: Diagnosis not present

## 2018-10-14 DIAGNOSIS — R0902 Hypoxemia: Secondary | ICD-10-CM

## 2018-10-14 DIAGNOSIS — Z8249 Family history of ischemic heart disease and other diseases of the circulatory system: Secondary | ICD-10-CM

## 2018-10-14 DIAGNOSIS — Z794 Long term (current) use of insulin: Secondary | ICD-10-CM

## 2018-10-14 DIAGNOSIS — Z87440 Personal history of urinary (tract) infections: Secondary | ICD-10-CM

## 2018-10-14 DIAGNOSIS — Z7982 Long term (current) use of aspirin: Secondary | ICD-10-CM | POA: Diagnosis not present

## 2018-10-14 DIAGNOSIS — E118 Type 2 diabetes mellitus with unspecified complications: Secondary | ICD-10-CM | POA: Diagnosis not present

## 2018-10-14 DIAGNOSIS — Z8673 Personal history of transient ischemic attack (TIA), and cerebral infarction without residual deficits: Secondary | ICD-10-CM

## 2018-10-14 DIAGNOSIS — F17210 Nicotine dependence, cigarettes, uncomplicated: Secondary | ICD-10-CM | POA: Diagnosis present

## 2018-10-14 DIAGNOSIS — R0602 Shortness of breath: Secondary | ICD-10-CM | POA: Diagnosis present

## 2018-10-14 DIAGNOSIS — E119 Type 2 diabetes mellitus without complications: Secondary | ICD-10-CM | POA: Diagnosis not present

## 2018-10-14 DIAGNOSIS — I493 Ventricular premature depolarization: Secondary | ICD-10-CM | POA: Diagnosis present

## 2018-10-14 DIAGNOSIS — E785 Hyperlipidemia, unspecified: Secondary | ICD-10-CM | POA: Diagnosis not present

## 2018-10-14 DIAGNOSIS — I5043 Acute on chronic combined systolic (congestive) and diastolic (congestive) heart failure: Secondary | ICD-10-CM | POA: Diagnosis not present

## 2018-10-14 DIAGNOSIS — N390 Urinary tract infection, site not specified: Secondary | ICD-10-CM | POA: Diagnosis not present

## 2018-10-14 DIAGNOSIS — K219 Gastro-esophageal reflux disease without esophagitis: Secondary | ICD-10-CM | POA: Diagnosis not present

## 2018-10-14 DIAGNOSIS — R21 Rash and other nonspecific skin eruption: Secondary | ICD-10-CM | POA: Diagnosis not present

## 2018-10-14 DIAGNOSIS — Z9114 Patient's other noncompliance with medication regimen: Secondary | ICD-10-CM | POA: Diagnosis not present

## 2018-10-14 DIAGNOSIS — L814 Other melanin hyperpigmentation: Secondary | ICD-10-CM | POA: Diagnosis not present

## 2018-10-14 DIAGNOSIS — Z8679 Personal history of other diseases of the circulatory system: Secondary | ICD-10-CM | POA: Diagnosis not present

## 2018-10-14 DIAGNOSIS — Z9111 Patient's noncompliance with dietary regimen: Secondary | ICD-10-CM | POA: Diagnosis not present

## 2018-10-14 DIAGNOSIS — Z823 Family history of stroke: Secondary | ICD-10-CM | POA: Diagnosis not present

## 2018-10-14 DIAGNOSIS — E1165 Type 2 diabetes mellitus with hyperglycemia: Secondary | ICD-10-CM | POA: Diagnosis not present

## 2018-10-14 DIAGNOSIS — J9611 Chronic respiratory failure with hypoxia: Secondary | ICD-10-CM | POA: Diagnosis not present

## 2018-10-14 DIAGNOSIS — I10 Essential (primary) hypertension: Secondary | ICD-10-CM

## 2018-10-14 DIAGNOSIS — I5033 Acute on chronic diastolic (congestive) heart failure: Secondary | ICD-10-CM | POA: Diagnosis not present

## 2018-10-14 DIAGNOSIS — I495 Sick sinus syndrome: Secondary | ICD-10-CM

## 2018-10-14 DIAGNOSIS — J301 Allergic rhinitis due to pollen: Secondary | ICD-10-CM | POA: Diagnosis not present

## 2018-10-14 DIAGNOSIS — Z79899 Other long term (current) drug therapy: Secondary | ICD-10-CM

## 2018-10-14 DIAGNOSIS — Z9981 Dependence on supplemental oxygen: Secondary | ICD-10-CM | POA: Diagnosis present

## 2018-10-14 DIAGNOSIS — E113399 Type 2 diabetes mellitus with moderate nonproliferative diabetic retinopathy without macular edema, unspecified eye: Secondary | ICD-10-CM | POA: Diagnosis not present

## 2018-10-14 DIAGNOSIS — Z888 Allergy status to other drugs, medicaments and biological substances status: Secondary | ICD-10-CM | POA: Diagnosis not present

## 2018-10-14 DIAGNOSIS — I428 Other cardiomyopathies: Secondary | ICD-10-CM | POA: Diagnosis not present

## 2018-10-14 DIAGNOSIS — E669 Obesity, unspecified: Secondary | ICD-10-CM | POA: Diagnosis present

## 2018-10-14 DIAGNOSIS — J9601 Acute respiratory failure with hypoxia: Principal | ICD-10-CM | POA: Diagnosis present

## 2018-10-14 DIAGNOSIS — R300 Dysuria: Secondary | ICD-10-CM | POA: Diagnosis not present

## 2018-10-14 DIAGNOSIS — N3941 Urge incontinence: Secondary | ICD-10-CM

## 2018-10-14 DIAGNOSIS — Z9861 Coronary angioplasty status: Secondary | ICD-10-CM | POA: Diagnosis not present

## 2018-10-14 DIAGNOSIS — B964 Proteus (mirabilis) (morganii) as the cause of diseases classified elsewhere: Secondary | ICD-10-CM | POA: Diagnosis present

## 2018-10-14 DIAGNOSIS — J9691 Respiratory failure, unspecified with hypoxia: Secondary | ICD-10-CM | POA: Diagnosis present

## 2018-10-14 DIAGNOSIS — I5023 Acute on chronic systolic (congestive) heart failure: Secondary | ICD-10-CM

## 2018-10-14 DIAGNOSIS — I455 Other specified heart block: Secondary | ICD-10-CM | POA: Diagnosis not present

## 2018-10-14 DIAGNOSIS — J9621 Acute and chronic respiratory failure with hypoxia: Secondary | ICD-10-CM | POA: Diagnosis not present

## 2018-10-14 LAB — BASIC METABOLIC PANEL
ANION GAP: 8 (ref 5–15)
BUN: 14 mg/dL (ref 6–20)
CO2: 33 mmol/L — ABNORMAL HIGH (ref 22–32)
Calcium: 8.9 mg/dL (ref 8.9–10.3)
Chloride: 96 mmol/L — ABNORMAL LOW (ref 98–111)
Creatinine, Ser: 0.79 mg/dL (ref 0.44–1.00)
GFR calc Af Amer: >60 mL/min (ref >60)
GFR calc non Af Amer: >60 mL/min (ref >60)
Glucose, Bld: 106 mg/dL — ABNORMAL HIGH (ref 70–99)
POTASSIUM: 3.9 mmol/L (ref 3.5–5.1)
Sodium: 137 mmol/L (ref 135–145)

## 2018-10-14 LAB — URINALYSIS, ROUTINE W REFLEX MICROSCOPIC
BILIRUBIN URINE: NEGATIVE
Glucose, UA: 150 mg/dL — AB
Hgb urine dipstick: NEGATIVE
KETONES UR: NEGATIVE mg/dL
Nitrite: NEGATIVE
PH: 8 (ref 5.0–8.0)
Protein, ur: 100 mg/dL — AB
SPECIFIC GRAVITY, URINE: 1.011 (ref 1.005–1.030)

## 2018-10-14 LAB — CBC
HEMATOCRIT: 42.2 % (ref 36.0–46.0)
Hemoglobin: 12.2 g/dL (ref 12.0–15.0)
MCH: 26 pg (ref 26.0–34.0)
MCHC: 28.9 g/dL — ABNORMAL LOW (ref 30.0–36.0)
MCV: 90 fL (ref 80.0–100.0)
NRBC: 1.4 % — AB (ref 0.0–0.2)
Platelets: 321 10*3/uL (ref 150–400)
RBC: 4.69 MIL/uL (ref 3.87–5.11)
RDW: 18.3 % — ABNORMAL HIGH (ref 11.5–15.5)
WBC: 6.6 10*3/uL (ref 4.0–10.5)

## 2018-10-14 LAB — GLUCOSE, CAPILLARY
GLUCOSE-CAPILLARY: 199 mg/dL — AB (ref 70–99)
Glucose-Capillary: 364 mg/dL — ABNORMAL HIGH (ref 70–99)

## 2018-10-14 LAB — MRSA PCR SCREENING: MRSA BY PCR: NEGATIVE

## 2018-10-14 LAB — BRAIN NATRIURETIC PEPTIDE: B Natriuretic Peptide: 226.7 pg/mL — ABNORMAL HIGH (ref 0.0–100.0)

## 2018-10-14 MED ORDER — ENOXAPARIN SODIUM 40 MG/0.4ML ~~LOC~~ SOLN
40.0000 mg | SUBCUTANEOUS | Status: DC
  Administered 2018-10-14 – 2018-10-18 (×5): 40 mg via SUBCUTANEOUS
  Filled 2018-10-14 (×5): qty 0.4

## 2018-10-14 MED ORDER — INSULIN ASPART 100 UNIT/ML ~~LOC~~ SOLN
0.0000 [IU] | Freq: Every day | SUBCUTANEOUS | Status: DC
  Administered 2018-10-14: 5 [IU] via SUBCUTANEOUS

## 2018-10-14 MED ORDER — INSULIN ASPART 100 UNIT/ML ~~LOC~~ SOLN
10.0000 [IU] | Freq: Three times a day (TID) | SUBCUTANEOUS | Status: DC
  Administered 2018-10-14 – 2018-10-16 (×5): 10 [IU] via SUBCUTANEOUS

## 2018-10-14 MED ORDER — INSULIN ASPART 100 UNIT/ML ~~LOC~~ SOLN
0.0000 [IU] | Freq: Three times a day (TID) | SUBCUTANEOUS | Status: DC
  Administered 2018-10-14: 3 [IU] via SUBCUTANEOUS
  Administered 2018-10-15: 15 [IU] via SUBCUTANEOUS

## 2018-10-14 MED ORDER — FUROSEMIDE 10 MG/ML IJ SOLN
40.0000 mg | Freq: Once | INTRAMUSCULAR | Status: AC
  Administered 2018-10-14: 40 mg via INTRAVENOUS
  Filled 2018-10-14: qty 4

## 2018-10-14 MED ORDER — CARVEDILOL 25 MG PO TABS
25.0000 mg | ORAL_TABLET | Freq: Two times a day (BID) | ORAL | Status: DC
  Administered 2018-10-14 – 2018-10-16 (×4): 25 mg via ORAL
  Filled 2018-10-14 (×4): qty 1

## 2018-10-14 MED ORDER — ATORVASTATIN CALCIUM 40 MG PO TABS
40.0000 mg | ORAL_TABLET | Freq: Every day | ORAL | Status: DC
  Administered 2018-10-15 – 2018-10-19 (×5): 40 mg via ORAL
  Filled 2018-10-14 (×6): qty 1

## 2018-10-14 MED ORDER — AMLODIPINE BESYLATE 10 MG PO TABS
10.0000 mg | ORAL_TABLET | Freq: Every day | ORAL | Status: DC
  Administered 2018-10-15 – 2018-10-19 (×5): 10 mg via ORAL
  Filled 2018-10-14 (×5): qty 1

## 2018-10-14 MED ORDER — ASPIRIN EC 81 MG PO TBEC
81.0000 mg | DELAYED_RELEASE_TABLET | Freq: Every day | ORAL | Status: DC
  Administered 2018-10-14 – 2018-10-19 (×6): 81 mg via ORAL
  Filled 2018-10-14 (×6): qty 1

## 2018-10-14 MED ORDER — ACETAMINOPHEN 325 MG PO TABS: 650.0000 mg | ORAL_TABLET | Freq: Four times a day (QID) | ORAL | Status: DC | PRN

## 2018-10-14 MED ORDER — SENNOSIDES-DOCUSATE SODIUM 8.6-50 MG PO TABS: 1.0000 | ORAL_TABLET | Freq: Every evening | ORAL | Status: DC | PRN

## 2018-10-14 MED ORDER — ACETAMINOPHEN 650 MG RE SUPP: 650.0000 mg | Freq: Four times a day (QID) | RECTAL | Status: DC | PRN

## 2018-10-14 MED ORDER — INSULIN GLARGINE 100 UNIT/ML ~~LOC~~ SOLN
50.0000 [IU] | Freq: Every day | SUBCUTANEOUS | Status: DC
  Administered 2018-10-14 – 2018-10-15 (×2): 50 [IU] via SUBCUTANEOUS
  Filled 2018-10-14 (×2): qty 0.5

## 2018-10-14 NOTE — Progress Notes (Signed)
   CC: dyspnea  HPI:   Alicia [name] is a 62 y.o. female with a medical history of HTN, DM, and systolic HF as well as the other medical conditions listed below who presents to the internal medicine clinic with dyspnea. Please see problem based charting for the history and status of the patient's current and chronic medical conditions.   Past Medical History:  Diagnosis Date  . Abscess of skin of abdomen 08/31/2018  . Acquired lactose intolerance 09/24/2017  . 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  Non-functioning  . Aortic atherosclerosis ([place]) 09/24/2017   Asymptomatic, found on CT scan  . Blood transfusion without reported diagnosis   . Chronic Systolic Heart Failure 5/91/6384   Felt to be non-ischemic and secondary to hypertension.  Echo (05/28/2014): LVEF 25%.  Is not interested in AICD placement.  Marland Kitchen COPD exacerbation ([place])   . Coronary artery disease involving native coronary artery of native heart without angina pectoris 11/09/2014   Cardiac cath (02/18/2014): Non-obstructive, mRCA 30%, dRCA 60%  . 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 Diverticulosis of colon 09/24/2017  . Essential hypertension 10/15/2013  . Gastroesophageal reflux disease 09/24/2017  . History of cerebrovascular accident 05/10/2013   Per patient report 6 previous strokes, most recent on 05/10/13.  No residual deficits.  . Hyperlipidemia   . Overweight (BMI 25.0-29.9) 09/24/2017  . Psoriasis 09/24/2017  . Seasonal allergic rhinitis due to pollen 09/24/2017   Spring and early Fall  . Small Bowel Obstruction (SBO) 01/13/2014   Ex-Lap & Lysis of Adhesion, 01/15/2014  . Tobacco use disorder 01/15/2014  . Type 2 diabetes mellitus with moderate nonproliferative diabetic retinopathy ([place]) 10/15/2013    Review of Systems:   Pertinent positives mentioned in HPI. Remainder of all ROS negative.  Physical  Exam: Vitals:   10/14/18 0957 10/14/18 1210  BP: 135/68 (!) 122/56  Pulse: (!) 59 (!) 50  Temp: 98.1 F (36.7 C) (!) 97.4 F (36.3 C)  TempSrc: Oral Oral  SpO2: (!) 77% 95%  Weight: 225 lb 1.6 oz (102.1 kg)   Height: 5' 5.5" (1.664 m)    Physical Exam  Constitutional: Well-developed, well-nourished, and in no distress.  Eyes: Pupils are equal, round, and reactive to light. EOM are normal.  Cardiovascular: Normal rate and regular rhythm. No murmurs, rubs, or gallops. JVD. Pulmonary/Chest: Effort normal. Bibasilar crackles. There is an area of hyperpigmentation underneath the right breast without signs of candida. Abdominal: Bowel sounds present. Abdomen is mildly distended with mild diffuse TTP. Well-healed abscess on RLQ.  Back: No CVA tenderness. Ext: 1+ pitting edema bilateral lower extremities Skin: Warm and dry.    Assessment & Plan:   See Encounters Tab for problem based charting.  Patient seen with Dr. Evette Doffing

## 2018-10-14 NOTE — H&P (Signed)
Date: 10/14/2018               Patient Name:  Alicia [name] MRN: [id number]  DOB: [birth year] Age / Sex: 62 y.o., female   PCP: Oval Linsey, MD         Medical Service: Internal Medicine Teaching Service         Attending Physician: Dr. Bartholomew Crews, MD    First Contact: Dr. Koleen Distance  Pager: [phone]  Second Contact: Dr. Tarri Abernethy Pager: [phone]       After Hours (After 5p/  First Contact Pager: [phone]  weekends / holidays): Second Contact Pager: [phone]   Chief Complaint: shortness of breath  History of Present Illness: Alicia [name] is a 62 year old female with a past medical history of hypertension, type 2 diabetes and systolic heart failure that presents to the internal medicine clinic for dyspnea. She reports a month long history of progressively worsening dyspnea on exertion, orthopnea and leg swelling.  She reports adherence to Lasix 40 mg daily, amlodipine 10 mg daily and carvedilol 25 mg twice a day. She reports that she is no longer urinating as much as she used to on her current Lasix dose of 40 mg daily.  She reports a high salt diet.  She states she is not taking Entresto and hasn't for the past 4 months due to itching allergy.  She was placed on supplemental oxygen in the clinic and reports improvement in her shortness of breath.  She denies fever/chills, chest pain, or abdominal pain.  She reports malodorous urine with occasional dysuria.  Meds:  Home meds include:Amlodipine 10 mg daily , atorvastatin 40 mg daily, carvedilol 25 mg BID, Lasix 40 mg daily,metformin 1000 mg BID, Bydureon 2mg  weekly, Lantus 50 units QHS, Humalog 20 units with meals No outpatient medications have been marked as taking for the 10/14/18 encounter [facility] Encounter).    Allergies: Allergies as of 10/14/2018 - Review Complete 10/14/2018  Allergen Reaction Noted  . Canagliflozin Itching, Anxiety, and Palpitations 08/12/2018   Past Medical History:  Diagnosis Date  .  Abscess of skin of abdomen 08/31/2018  . Acquired lactose intolerance 09/24/2017  . 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  Non-functioning  . Aortic atherosclerosis ([place]) 09/24/2017   Asymptomatic, found on CT scan  . Blood transfusion without reported diagnosis   . Chronic Systolic Heart Failure 0/35/0093   Felt to be non-ischemic and secondary to hypertension.  Echo (05/28/2014): LVEF 25%.  Is not interested in AICD placement.  Marland Kitchen COPD exacerbation ([place])   . Coronary artery disease involving native coronary artery of native heart without angina pectoris 11/09/2014   Cardiac cath (02/18/2014): Non-obstructive, mRCA 30%, dRCA 60%  . 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 Diverticulosis of colon 09/24/2017  . Essential hypertension 10/15/2013  . Gastroesophageal reflux disease 09/24/2017  . History of cerebrovascular accident 05/10/2013   Per patient report 6 previous strokes, most recent on 05/10/13.  No residual deficits.  . Hyperlipidemia   . Overweight (BMI 25.0-29.9) 09/24/2017  . Psoriasis 09/24/2017  . Seasonal allergic rhinitis due to pollen 09/24/2017   Spring and early Fall  . Small Bowel Obstruction (SBO) 01/13/2014   Ex-Lap & Lysis of Adhesion, 01/15/2014  . Tobacco use disorder 01/15/2014  . Type 2 diabetes mellitus with moderate nonproliferative diabetic retinopathy ([place]) 10/15/2013    Family History:  Family History  Problem Relation Age of Onset  . Diabetes Mellitus II Mother   . Hypertension Mother   . Cerebrovascular Accident Mother 69       Massive, resulted in death  . Heart failure Brother   . Obesity Brother   . Pulmonary embolism Father 5       Resulted in sudden death  . Coronary artery disease Brother   . Obesity Brother   . Diabetes Mellitus II Brother   . Healthy Brother   . Healthy Brother   . Obesity Son   . Healthy Son   . Healthy Daughter     Social History: tobacco use: 20-pack-year smoking history Alcohol use: Denies Illicit drug use: Denies  Review of Systems: A complete ROS was negative except as per HPI.   Physical Exam: Blood pressure (!) 155/96, pulse 74, temperature 98.3 F (36.8 C), temperature source Oral, resp. rate 20, SpO2 97 %. Physical Exam  Constitutional: She is well-developed, well-nourished, and in no distress.  Cardiovascular: Normal rate, regular rhythm and normal heart sounds. Exam reveals no gallop and no friction rub.  No murmur heard. Pulmonary/Chest: Effort normal. No respiratory distress.  Decreased breath sounds bilaterally Scant bibasilar crackles  Abdominal: Soft. Bowel sounds are normal. She exhibits no distension. There is no tenderness. There is no rebound and no guarding.  Musculoskeletal:  1+ pitting edema in lower extremities bilaterally  Skin: Skin is warm and dry.    Assessment & Plan by Problem: Active Problems:   Respiratory failure with hypoxia (HCC)  Respiratory failure with hypoxia Patient presented with clinical symptoms of acute failure exacerbation, likely in the setting of dietary indiscretions and ineffective Lasix.  She was hypoxic at 77% on room air in the internal medicine clinic.  After supplemental oxygen was administered at 3 L via nasal cannula she improved to 95%.  Blood work obtained in the clinic showed a BNP of 226.  She is up 7 pounds on her previous clinic visit 1 month ago.  Patient had an echo 06/2018 showing LV EF 45-50% and moderately dilated left atrium.  Will obtain Chest x-ray, EKG and diurese. -chest x-ray -EKG -1 dose of IV Lasix 40mg  -Strict I's and O's -Daily weights -bmet -telemetry   Uncontrolled type 2 diabetes on insulin Most recent hemoglobin A1c is 9.9 on 10/25. Will continue insulin during admission and adjust as needed. -Lantus 50 daily at bedtime -10 units NovoLog with meals -SSI  CAD HTN Continuing home carvedilol, amlodipine,  atorvastatin and aspirin  Dysuria Will obtain UA  Dispo: Admit patient to Inpatient with expected length of stay greater than 2 midnights.  Signed: Valinda Party, DO 10/14/2018, 3:44 PM  Pager: [phone]

## 2018-10-14 NOTE — Assessment & Plan Note (Signed)
HPI: Patient reports occasional dysuria and urinary urgency with incontinence if she doesn't make it to the bathroom fast enough. She also endorses malodorous urine. She has diffuse abdominal tenderness due to volume overload, but this is not worse in the suprapubic region. She denies back pain. Her last UTI was about a year ago when she was admitted for cholecystitis.   Assessment: Afebrile. No CVA tenderness on exam. Given the patient's symptoms, will collect urine to evaluate for UTI. Added abdominal pressure from fluid may also be causing bladder compression and urinary urgency.   Plan 1. UA

## 2018-10-14 NOTE — Progress Notes (Addendum)
Internal Medicine Clinic Attending  I saw and evaluated the patient.  I personally confirmed the key portions of the history and exam documented by Dr. Dorrell and I reviewed pertinent patient test results.  The assessment, diagnosis, and plan were formulated together and I agree with the documentation in the resident's note. 

## 2018-10-14 NOTE — Assessment & Plan Note (Addendum)
Ms. Cordon reports progressive dyspnea for the past month. She cannot wash the dishes without feeling short of breath. She also reports orthopnea, weakness, leg swelling, abdominal firmness and pain, and weight gain. She does not weigh herself at home. She tries to avoid salty foods, but she ate ham for breakfast recently. She reports compliance with her medications. She takes lasix 40mg  daily, but she feels like she is no longer urinating as much as she used to on this dose. She has never used supplemental oxygen before.  Assessment: Patient appears volume overloaded on exam with bibasilar crackles, JVD, and pitting edema of the bilateral lower extremities. Upon presentation to the clinic on room air, her saturation was 77%. She was placed on 3L of oxygen by [place] and her sats improved to 95%. BP within target today at 122/56. Weight is up 7lbs since she was seen last month. Patient's weight has steadily increased over the past year. Her presentation is consistent with an acute on chronic heart failure exacerbation. Her last ECHO in 06/2018 showed EF of 45-50%. She requires admission to the hospital for IV diuresis.   Plan 1. Admit to the internal medicine service 2. CBC, BMP, BNP

## 2018-10-14 NOTE — Progress Notes (Signed)
Report called to [place] on [address].  Patient transported via wheelchair to [place] [facility] via wheelchair.  Patient remains alert and oriented.  Transported with oxygen at 3 liters.  Sander Nephew, RN 10/14/2018 1:40 PM.

## 2018-10-14 NOTE — Assessment & Plan Note (Signed)
Patient reports itching under the right breast. On exam, there is a round hyperpigmented patch. There is no evidence of candida. This may be the start of a pressure ulcer from the compression of her breast and abdomen, psoriasis scar, or the beginning of a candidal infection.   Plan 1. Observation while in the hospital for heart failure

## 2018-10-15 ENCOUNTER — Other Ambulatory Visit: Payer: Self-pay

## 2018-10-15 ENCOUNTER — Encounter (HOSPITAL_COMMUNITY): Payer: Self-pay | Admitting: General Practice

## 2018-10-15 DIAGNOSIS — Z8679 Personal history of other diseases of the circulatory system: Secondary | ICD-10-CM

## 2018-10-15 DIAGNOSIS — Z79899 Other long term (current) drug therapy: Secondary | ICD-10-CM

## 2018-10-15 DIAGNOSIS — I5043 Acute on chronic combined systolic (congestive) and diastolic (congestive) heart failure: Secondary | ICD-10-CM

## 2018-10-15 DIAGNOSIS — I5033 Acute on chronic diastolic (congestive) heart failure: Secondary | ICD-10-CM

## 2018-10-15 DIAGNOSIS — Z9861 Coronary angioplasty status: Secondary | ICD-10-CM

## 2018-10-15 DIAGNOSIS — Z9114 Patient's other noncompliance with medication regimen: Secondary | ICD-10-CM

## 2018-10-15 DIAGNOSIS — E118 Type 2 diabetes mellitus with unspecified complications: Secondary | ICD-10-CM

## 2018-10-15 DIAGNOSIS — R300 Dysuria: Secondary | ICD-10-CM

## 2018-10-15 DIAGNOSIS — I110 Hypertensive heart disease with heart failure: Secondary | ICD-10-CM

## 2018-10-15 DIAGNOSIS — Z7982 Long term (current) use of aspirin: Secondary | ICD-10-CM

## 2018-10-15 DIAGNOSIS — Z9111 Patient's noncompliance with dietary regimen: Secondary | ICD-10-CM

## 2018-10-15 DIAGNOSIS — J9601 Acute respiratory failure with hypoxia: Principal | ICD-10-CM

## 2018-10-15 DIAGNOSIS — I2510 Atherosclerotic heart disease of native coronary artery without angina pectoris: Secondary | ICD-10-CM

## 2018-10-15 DIAGNOSIS — Z794 Long term (current) use of insulin: Secondary | ICD-10-CM

## 2018-10-15 LAB — BASIC METABOLIC PANEL WITH GFR
Anion gap: 6 (ref 5–15)
BUN: 20 mg/dL (ref 6–20)
CO2: 33 mmol/L — ABNORMAL HIGH (ref 22–32)
Calcium: 8.3 mg/dL — ABNORMAL LOW (ref 8.9–10.3)
Chloride: 94 mmol/L — ABNORMAL LOW (ref 98–111)
Creatinine, Ser: 0.98 mg/dL (ref 0.44–1.00)
GFR calc Af Amer: >60 mL/min
GFR calc non Af Amer: >60 mL/min
Glucose, Bld: 297 mg/dL — ABNORMAL HIGH (ref 70–99)
Potassium: 4 mmol/L (ref 3.5–5.1)
Sodium: 133 mmol/L — ABNORMAL LOW (ref 135–145)

## 2018-10-15 LAB — TSH: TSH: 1.624 u[IU]/mL (ref 0.350–4.500)

## 2018-10-15 LAB — GLUCOSE, CAPILLARY
GLUCOSE-CAPILLARY: 194 mg/dL — AB (ref 70–99)
GLUCOSE-CAPILLARY: 279 mg/dL — AB (ref 70–99)
Glucose-Capillary: 245 mg/dL — ABNORMAL HIGH (ref 70–99)
Glucose-Capillary: 221 mg/dL — ABNORMAL HIGH (ref 70–99)

## 2018-10-15 LAB — HIV ANTIBODY (ROUTINE TESTING W REFLEX): HIV Screen 4th Generation wRfx: NONREACTIVE

## 2018-10-15 MED ORDER — INSULIN ASPART 100 UNIT/ML ~~LOC~~ SOLN
0.0000 [IU] | Freq: Three times a day (TID) | SUBCUTANEOUS | Status: DC
  Administered 2018-10-15: 7 [IU] via SUBCUTANEOUS
  Administered 2018-10-16 (×3): 3 [IU] via SUBCUTANEOUS
  Administered 2018-10-17 – 2018-10-18 (×3): 7 [IU] via SUBCUTANEOUS
  Administered 2018-10-18: 4 [IU] via SUBCUTANEOUS
  Administered 2018-10-18: 7 [IU] via SUBCUTANEOUS
  Administered 2018-10-19 (×2): 4 [IU] via SUBCUTANEOUS

## 2018-10-15 MED ORDER — INSULIN ASPART 100 UNIT/ML ~~LOC~~ SOLN
0.0000 [IU] | Freq: Every day | SUBCUTANEOUS | Status: DC
  Administered 2018-10-15 – 2018-10-17 (×2): 3 [IU] via SUBCUTANEOUS

## 2018-10-15 MED ORDER — SODIUM CHLORIDE 0.9 % IV SOLN
1.0000 g | INTRAVENOUS | Status: DC
  Administered 2018-10-15 – 2018-10-19 (×5): 1 g via INTRAVENOUS
  Filled 2018-10-15 (×5): qty 10

## 2018-10-15 MED ORDER — NYSTATIN 100000 UNIT/GM EX POWD
Freq: Three times a day (TID) | CUTANEOUS | Status: DC
  Administered 2018-10-15 – 2018-10-18 (×11): via TOPICAL
  Filled 2018-10-15: qty 15

## 2018-10-15 MED ORDER — FUROSEMIDE 10 MG/ML IJ SOLN
40.0000 mg | Freq: Two times a day (BID) | INTRAMUSCULAR | Status: DC
  Administered 2018-10-15 – 2018-10-16 (×3): 40 mg via INTRAVENOUS
  Filled 2018-10-15 (×3): qty 4

## 2018-10-15 NOTE — Progress Notes (Signed)
Date: 10/15/2018  Patient name: Alicia [name]  Medical record number: [id number]  Date of birth: [birth year]   I have seen and evaluated Alicia [name] and discussed their care with the Residency Team. Alicia [name] is a 62 yo woman with history of systolic heart failure but her EF now is normal, poorly controlled diabetes, and hypertension.  She has not been feeling well for a few weeks and presented to the [facility] for dyspnea.  She was hypoxic at 77% in the clinic.  Her dyspnea and dyspnea on exertion along with orthopnea and leg swelling has been increasing over the past month.  Additionally, she complains of feeling like she was going to pass out and shaking.  She has not been taking her Delene Loll because it had a diffuse body rash and itching.  She also ran out of her Lasix on the day prior to admission.  She was felt to have a heart failure exacerbation and was given 1 dose of IV Lasix 40 mg.  Overnight, she had a net negative of 1.9 L and she no longer feels like she is going to pass out and has been able to ambulate to the toilet.  She has a stable 2 pillow orthopnea.  She states she is not using consult but was unaware that salt is in almost all processed foods.  She also likes to drink a lot of water but was unable to quantify her liquid intake.  She also complained of dysuria and a malodorous urine for a few weeks.  She states that she does not get UTIs or vaginal yeast infections on a frequent basis.  PMHx : Her echo in 2015 showed an EF of 20 to 25% with hypokinesis in the inferior myocardium.  Repeat echo in August of this year showed that her EF had increased to 45 to 50% with diffuse hypokinesis.  There was also moderate dilation of the left ventricle, hypertrophy, and moderately dilated left atrium.  She had a cardiac catheterization in 2015 which showed two-vessel CAD with 30% mid RCA stenosis and 60% diffuse stenosis.  It was felt that her severe left ventricular dysfunction is out of proportion  to her CAD and medical therapy was recommended.  Vitals:   10/15/18 0542 10/15/18 1306  BP: 136/72 140/74  Pulse: 73 74  Resp: 18 16  Temp: 98.5 F (36.9 C)   SpO2: 97% 96%  Net - 1.9 since admission HRRR no MRG LCATB with good air flow Ext +1 edema to knees B  Cr 0.98 BNP 228 TSH 1.6 A1C October 9.9 UA 0-5 sq epi, 150 glucose, lg LE, neg nitrate, 100 protein, > 50 WBC, 0-5 RBC  I personally viewed the CXR images and confirmed my reading with the official read. AP and lateral. Poor inspiration, loss of R diaphragm 2/2 poor inspiration, body habitus and underpenetration.  I personally viewed the EKG and confirmed my reading with the official read.  Sinus bradycardia, LAD, flattening of the T waves diffusely, biphasic T waves in lead V1.  Assessment and Plan: I have seen and evaluated the patient as outlined above. I agree with the formulated Assessment and Plan as detailed in the residents' note, with the following changes: Alicia [name] is a 62 yo woman with history of systolic heart failure but her EF now is normal.  She was admitted from clinic for hypoxic respiratory failure secondary to acute on chronic diastolic heart failure exacerbation.  This diagnosis is supported by her 5 pound weight  gain since October, her elevated BNP, and her symptoms.  She has already responded to 1 dose of IV Lasix 40 mg with a net negative diuresis of 1.9 L.  The etiology of the heart failure exacerbation appears to be a combination of dietary discretion and inability to take her prescribed medications.  Her EKG does not show acute ischemia, her TSH is normal, and she has had no arrhythmia on telemetry.  As she appears to continue to have volume to give and her creatinine has not bumped, we will continue with IV diuresis.  We will also maximize her home heart failure and CAD (nonobstructive) regimen. We are are continuing her current beta-blocker dosage, aspirin, and high intensity statin.  Since she is unable  to tolerate her Entresto, we will resume her lisinopril which she had been on prior to starting Entresto.  1.  Acute hypoxic respiratory failure secondary to acute on chronic diastolic heart failure exacerbation -continue diuresis with IV Lasix 40 twice daily.  Continue beta-blocker but add lisinopril.  Titrate oxygen down as able.  2.  Nonobstructive CAD -continue home dose of Coreg, Lipitor high intensity, and aspirin 81 mg.  EKG does not show any ACS.  3.  Poorly controlled diabetes insulin-dependent -we are continuing her Glucophage thousand milligrams twice daily and Lantus 50 units daily but holding her bydureon.  Apparently she has been on Invokanna but it was stopped due to itching.  She knows her diabetes is poorly controlled and will need outpatient follow-up for diabetes education and management.  4.  Symptomatic UTI -await culture results and start Rocephin.  Bartholomew Crews, MD 11/23/20192:15 PM

## 2018-10-15 NOTE — Plan of Care (Signed)
  Problem: Clinical Measurements: Goal: Ability to maintain clinical measurements within normal limits will improve Outcome: Progressing   Problem: Clinical Measurements: Goal: Respiratory complications will improve Outcome: Progressing   

## 2018-10-15 NOTE — Progress Notes (Signed)
   Subjective: Alicia [name] was seen and evaluated at bedside on morning rounds.  No acute events overnight. She states that the last couple of months have been very stressful for her. She feels that she is not feeling well overall but does feel a little better this AM. She describes previous feelings of like she's gonna "pass out" but is no longer having this feeling. Having good urine output with the IV lasix. She is down a couple pounds. Ambulating without difficulty. Discussed that her heart is not squeezing as well as it should but her last echo looked fairly well compared to prior. Discussed the plan to continue IV diureses. We will have PT come by and see her and work towards getting her off the oxygen. She does state that she drinks a lot of fluid and eats a high salt diet. Endorses dysuria and foul smelling urine.   Discussed dietary changes with CHF and smoking cessation.   Objective: Vital signs in last 24 hours: Vitals:   10/14/18 1753 10/14/18 2122 10/15/18 0542 10/15/18 0548  BP: (!) 155/77 (!) 152/74 136/72   Pulse: 75 76 73   Resp: 19 18 18    Temp: 98.8 F (37.1 C) 98.2 F (36.8 C) 98.5 F (36.9 C)   TempSrc: Oral Oral Oral   SpO2: 97% 96% 97%   Weight:    101.5 kg  Height:    5\' 5"  (1.651 m)   General: Obese female in no acute distress Pulm: Good air movement with no wheezing or crackles  CV: RRR, no murmurs, no rubs  Abdomen: Active bowel sounds, soft, non-distended, no tenderness to palpation  Extremities: +1 piitng LE edema up to the knees  Assessment/Plan:  Active Problems:   Respiratory failure with hypoxia [facility])  Alicia [name] is a 62 year old female with a past medical history of hypertension, type 2 diabetes and systolic heart failure that presented to the [facility] for progressively worsening DOE, orthopnea and lower extremity edema. Found to be hypoxic on room and volume overloaded on exam so was subsequently admitted for acute heart failure exacerbation.    1. Acute hypoxic respiratory failure 2/2 acute combined heart failure exacerbation - shortness of breath improving; still on 3L nasal cannula  - net negative 1900 mL yesterday; current weight 101.5 which is approximately 3kg up from dry weight per cardiology note  - will continue diuresing with IV Lasix 40 mg BID - optimize heart failure medications - renal function and electrolytes stable; continue to trend daily - strict I&Os and daily weights  2. Uncontrolled type 2 diabetes on insulin - Lantus 50 daily at bedtime - 10 units NovoLog with meals + SSI  - CBGs still above goal. Will need to change her to resistant SSI  3. HTN:  - blood pressure stable on home Carvedilol and amlodipine   4. Urinary Tract Infection  - Endorses dysuria and foul smelling urine  - UA with pyuria, triple phosphate crystals, and bacteruria  - Previous cultures with Klebsiella sensitive to cephalosporins  - Urine culture ordered  - Will start ceftriaxone (should cover urease producing bacteria)  Dispo: Anticipated discharge in approximately 2-3 day(s) pending improvement in volume status and ability to get patient off oxygen therapy.   Ina Homes, MD 10/15/2018, 11:16 AM Pager: [phone]

## 2018-10-16 ENCOUNTER — Other Ambulatory Visit: Payer: Self-pay

## 2018-10-16 DIAGNOSIS — N390 Urinary tract infection, site not specified: Secondary | ICD-10-CM

## 2018-10-16 LAB — BASIC METABOLIC PANEL
Anion gap: 8 (ref 5–15)
BUN: 20 mg/dL (ref 6–20)
CALCIUM: 8.3 mg/dL — AB (ref 8.9–10.3)
CHLORIDE: 96 mmol/L — AB (ref 98–111)
CO2: 32 mmol/L (ref 22–32)
Creatinine, Ser: 0.75 mg/dL (ref 0.44–1.00)
GFR calc Af Amer: >60 mL/min (ref >60)
GFR calc non Af Amer: >60 mL/min (ref >60)
Glucose, Bld: 161 mg/dL — ABNORMAL HIGH (ref 70–99)
Potassium: 4.5 mmol/L (ref 3.5–5.1)
Sodium: 136 mmol/L (ref 135–145)

## 2018-10-16 LAB — GLUCOSE, CAPILLARY
GLUCOSE-CAPILLARY: 195 mg/dL — AB (ref 70–99)
Glucose-Capillary: 148 mg/dL — ABNORMAL HIGH (ref 70–99)
Glucose-Capillary: 146 mg/dL — ABNORMAL HIGH (ref 70–99)
Glucose-Capillary: 148 mg/dL — ABNORMAL HIGH (ref 70–99)

## 2018-10-16 LAB — MAGNESIUM: Magnesium: 1.5 mg/dL — ABNORMAL LOW (ref 1.7–2.4)

## 2018-10-16 MED ORDER — INSULIN ASPART 100 UNIT/ML ~~LOC~~ SOLN
12.0000 [IU] | Freq: Three times a day (TID) | SUBCUTANEOUS | Status: DC
  Administered 2018-10-16 – 2018-10-19 (×9): 12 [IU] via SUBCUTANEOUS

## 2018-10-16 MED ORDER — INSULIN GLARGINE 100 UNIT/ML ~~LOC~~ SOLN
45.0000 [IU] | Freq: Every day | SUBCUTANEOUS | Status: DC
  Administered 2018-10-16 – 2018-10-18 (×3): 45 [IU] via SUBCUTANEOUS
  Filled 2018-10-16 (×4): qty 0.45

## 2018-10-16 MED ORDER — FUROSEMIDE 10 MG/ML IJ SOLN
40.0000 mg | Freq: Three times a day (TID) | INTRAMUSCULAR | Status: DC
  Administered 2018-10-16 (×2): 40 mg via INTRAVENOUS
  Filled 2018-10-16 (×2): qty 4

## 2018-10-16 MED ORDER — MAGNESIUM SULFATE 4 GM/100ML IV SOLN
4.0000 g | Freq: Once | INTRAVENOUS | Status: AC
  Administered 2018-10-16: 4 g via INTRAVENOUS
  Filled 2018-10-16: qty 100

## 2018-10-16 NOTE — Progress Notes (Signed)
   Subjective: Patient is feeling weak and run down today. She is continuing to have good urine output. She was able to sleep well last night. Sugars remain above goal. Her dysuria and rash under her breast are improving. We discussed the plan to continue to diurese and make modifications to her insulin regimen. She would like to be home by Thanksgiving. All questions and concerns addressed.   Objective: Vital signs in last 24 hours: Vitals:   10/15/18 0548 10/15/18 1306 10/15/18 2041 10/16/18 0609  BP:  140/74 (!) 146/69 134/71  Pulse:  74 76 (!) 59  Resp:  16 18 18   Temp:   98.4 F (36.9 C) 98.7 F (37.1 C)  TempSrc:   Oral Oral  SpO2:  96% 100% 95%  Weight: 101.5 kg   100.7 kg  Height: 5\' 5"  (1.651 m)      General: Obese female in no acute distress Pulm: Good air movement with no wheezing or crackles  CV: RRR, no murmurs, no rubs  Abdomen: Active bowel sounds, soft, non-distended, no tenderness to palpation  Extremities: +1 pitting LE edema   Assessment/Plan:  [name] is a 62 year old female with a past medical history of hypertension, type 2 diabetes and systolic heart failure that presented to the [facility] for progressively worsening DOE, orthopnea and lower extremity edema. Found to be hypoxic on room and volume overloaded on exam so was subsequently admitted for acute heart failure exacerbation.   1. Acute hypoxic respiratory failure 2/2 acute combined heart failure exacerbation - Shortness of breath improving; still on 3L nasal cannula  - Net negative 1646 mL yesterday; current weight 100.7 kg which is approximately 2kg up from dry weight per cardiology note  - Will increase IV Lasix 40 mg to TID - Telemetry reviewed with frequent PVCs - Optimize heart failure medications - Renal function and electrolytes stable; continue to trend daily - Strict I&Os and daily weights  2. Uncontrolled type 2 diabetes on insulin - Patient's CBGs overal remain above goal but are  improving. Her overnight glucose dropped by >100. Therefore, to prevent hypoglycemia we will decrease her Lantus to 45 units and add on meal time insulin.  - Lantus 45 units daily at bedtime - 12 units NovoLog with meals + SSI   3. HTN:  - Blood pressure stable on home Carvedilol and amlodipine   4. Urinary Tract Infection  - Follow-up urine cultures  - Continue ceftriaxone (should cover urease producing bacteria)  Dispo: Anticipated discharge in approximately 2-3 day(s) pending improvement in volume status and ability to get patient off oxygen therapy.   Alicia Homes, MD 10/16/2018, 9:53 AM

## 2018-10-16 NOTE — Progress Notes (Signed)
Pt c/o 5/10 substernal chest pressure. States shes feels she was lying down to flat pain does not change and is constant. Obtaining EKG now and vital signs. IV lasix given and coffee per pt request.

## 2018-10-16 NOTE — Progress Notes (Signed)
Paged by RN that patient was having HR's in the 40's and complaining of chest pain. EKG ordered showed junctional rhythm. Held coreg and evaluated bedside. Patient reported chest pain had resolved but described it as a pressure in the center of her chest. Denied any nausea, vomiting, sweating, jaw or arm pain. She had an episode of chest pain at ~1230 pm that she attributed to a medication she got earlier in the morning. She had another episode around 10 pm that had resolved by the time we evaluated her. She also reported feeling as if she could not breath and that her neck was swollen.   Vitals:   10/16/18 1525 10/16/18 2012  BP: 119/66 (!) 112/52  Pulse: (!) 53 (!) 48  Resp:  20  Temp:  98.2 F (36.8 C)  SpO2: 98% 96%    Physical Exam  Constitutional: She is oriented to person, place, and time and well-developed, well-nourished, and in no distress.  Cardiovascular:  Bradycardic  Pulmonary/Chest: Effort normal and breath sounds normal. No respiratory distress.  Bibasilar crackles   Musculoskeletal: She exhibits edema.  Neurological: She is alert and oriented to person, place, and time.  Skin: Skin is warm and dry.    Patient's HR has decreased from 50-70's to 40's around 1230pm on 11/24. She was bradycardic at 51 on admission. Repeat EKG showed junctional rhythm. Unclear etiology. Held coreg and will trend troponin's given patient's new onset chest pain.  -hold coreg -trend troponin x3 q6h

## 2018-10-16 NOTE — Progress Notes (Signed)
HR dropping into the 40s50s frequent PVCs. Pt states she is not feeling well. MD made aware.

## 2018-10-17 ENCOUNTER — Other Ambulatory Visit: Payer: Self-pay | Admitting: Internal Medicine

## 2018-10-17 DIAGNOSIS — B964 Proteus (mirabilis) (morganii) as the cause of diseases classified elsewhere: Secondary | ICD-10-CM

## 2018-10-17 DIAGNOSIS — I495 Sick sinus syndrome: Secondary | ICD-10-CM

## 2018-10-17 DIAGNOSIS — I455 Other specified heart block: Secondary | ICD-10-CM

## 2018-10-17 DIAGNOSIS — N390 Urinary tract infection, site not specified: Secondary | ICD-10-CM

## 2018-10-17 DIAGNOSIS — I5043 Acute on chronic combined systolic (congestive) and diastolic (congestive) heart failure: Secondary | ICD-10-CM

## 2018-10-17 DIAGNOSIS — K219 Gastro-esophageal reflux disease without esophagitis: Secondary | ICD-10-CM

## 2018-10-17 DIAGNOSIS — J9621 Acute and chronic respiratory failure with hypoxia: Secondary | ICD-10-CM

## 2018-10-17 LAB — BASIC METABOLIC PANEL
ANION GAP: 8 (ref 5–15)
BUN: 35 mg/dL — AB (ref 6–20)
CO2: 33 mmol/L — ABNORMAL HIGH (ref 22–32)
Calcium: 8.4 mg/dL — ABNORMAL LOW (ref 8.9–10.3)
Chloride: 94 mmol/L — ABNORMAL LOW (ref 98–111)
Creatinine, Ser: 1.06 mg/dL — ABNORMAL HIGH (ref 0.44–1.00)
GFR calc Af Amer: >60 mL/min (ref >60)
GFR, EST NON AFRICAN AMERICAN: 56 mL/min — AB (ref >60)
Glucose, Bld: 145 mg/dL — ABNORMAL HIGH (ref 70–99)
Potassium: 3.9 mmol/L (ref 3.5–5.1)
SODIUM: 135 mmol/L (ref 135–145)

## 2018-10-17 LAB — GLUCOSE, CAPILLARY
GLUCOSE-CAPILLARY: 219 mg/dL — AB (ref 70–99)
GLUCOSE-CAPILLARY: 210 mg/dL — AB (ref 70–99)
GLUCOSE-CAPILLARY: 293 mg/dL — AB (ref 70–99)
Glucose-Capillary: 117 mg/dL — ABNORMAL HIGH (ref 70–99)

## 2018-10-17 LAB — MAGNESIUM: Magnesium: 2.3 mg/dL (ref 1.7–2.4)

## 2018-10-17 LAB — TROPONIN I
TROPONIN I: 0.04 ng/mL — AB (ref <0.03)
TROPONIN I: 0.04 ng/mL — AB (ref <0.03)
Troponin I: 0.03 ng/mL (ref <0.03)

## 2018-10-17 LAB — URINE CULTURE

## 2018-10-17 MED ORDER — FUROSEMIDE 10 MG/ML IJ SOLN
80.0000 mg | Freq: Two times a day (BID) | INTRAMUSCULAR | Status: DC
  Administered 2018-10-17 – 2018-10-19 (×5): 80 mg via INTRAVENOUS
  Filled 2018-10-17 (×5): qty 8

## 2018-10-17 MED ORDER — SACUBITRIL-VALSARTAN 49-51 MG PO TABS
1.0000 | ORAL_TABLET | Freq: Two times a day (BID) | ORAL | Status: DC
  Administered 2018-10-17 – 2018-10-19 (×5): 1 via ORAL
  Filled 2018-10-17 (×5): qty 1

## 2018-10-17 NOTE — Progress Notes (Signed)
Pt's HR sustained in the 40's, pt c/o 3/10 chest pressure, EKG showed junctional rhythm, Dr. Laural Golden notified, coreg 25mg  held, MD came to evaluate the patient. Order for troponin obtained.

## 2018-10-17 NOTE — Progress Notes (Signed)
   10/17/18 0534  Vitals  Temp 98.2 F (36.8 C)  Temp Source Oral  BP (!) 142/79  MAP (mmHg) 98  BP Location Left Arm  BP Method Automatic  Patient Position (if appropriate) Lying  Pulse Rate 66  Pulse Rate Source Dinamap  Resp 17  Oxygen Therapy  SpO2 99 %  O2 Device Nasal Cannula  O2 Flow Rate (L/min) 2 L/min  Pt had 5.21 sec sinus arrest, pt sleeping, asymptomatic. Dr. Laural Golden notified.

## 2018-10-17 NOTE — Progress Notes (Signed)
   Subjective: Alicia [name] was seen and evaluated at bedside on morning rounds. Patient had some chest pain overnight described as substernal pressure that had resolved by the time the night team came to evaluate. She is feeling much better compared to yesterday and does note a correlation between her Coreg and her symptoms. Whenever she took it she describes CP, low BP, and low HR. She has not had any further episodes this AM. We discussed the plan to continue with diureses and have cardiology come by to see her. All questions and concerns addressed.   Objective: Vital signs in last 24 hours: Vitals:   10/16/18 2012 10/17/18 0435 10/17/18 0531 10/17/18 0534  BP: (!) 112/52 138/79 (!) 142/79 (!) 142/79  Pulse: (!) 48 77 66 66  Resp: 20 20 16 17   Temp: 98.2 F (36.8 C) 98.4 F (36.9 C) 98.2 F (36.8 C) 98.2 F (36.8 C)  TempSrc: Oral Oral Oral Oral  SpO2: 96% 94% 99% 99%  Weight:  102.8 kg    Height:       General: Obese female in no acute distress Pulm: Good air movement with no wheezing or crackles  CV: RRR, no murmurs, no rubs  Abdomen: Active bowel sounds, soft, non-distended, no tenderness to palpation  Extremities: Mild pitting LE edema Skin: Warm and dry   Assessment/Plan:  [name] is a 62 year old female with a past medical history of hypertension, type 2 diabetes and systolic heart failure that presented to the [facility] for progressively worsening DOE, orthopnea and lower extremity edema. Found to be hypoxic on room and volume overloaded on exam so was subsequently admitted for acute heart failure exacerbation.   1. Acute hypoxic respiratory failure 2/2 acute combined heart failure exacerbation - Shortness of breath improving; now on 2L nasal cannula  - Patient had net + 280 yesterday; upon reviewing MAR, did not get second and third doses of Lasix until late in the evening. Doubt I&O's and weight are accurate. We will ask nursing to get a repeat standing weight.   -  Change furosemide to 80mg  BID  - Telemetry reviewed which showed > 5 second sinus pause (see below) - Renal function and electrolytes stable; continue to trend daily - Strict I&Os and daily weights - Optimize heart failure medications prior to discharge   2. Sinus pause, >5secs - Holding Coreg  - Continue telemetry  - Consult cardiology today, follows with Dr. Marlou Porch    3.Uncontrolled type 2 diabetes on insulin - Patient's CBGs much better controlled with adding meal time insulin  - Lantus 45 units daily at bedtime - 12 units NovoLog with meals+ SSI   4. HTN:  - Blood pressure stable on amlodipine; holding carvedilol   5. Urinary Tract Infection  - Urine cultures growing Proteus. Awaiting sensitivities  - Continue ceftriaxone   Dispo: Anticipated discharge in approximately 2-3 day(s) pending improvement in volume status, ability to wean off oxygen, and cardiology consultation.   Ina Homes, MD 10/17/2018, 8:20 AM Pager: [phone]

## 2018-10-17 NOTE — Progress Notes (Signed)
Internal Medicine Attending:   I saw and examined the patient. I reviewed the resident's note and I agree with the resident's findings and plan as documented in the resident's note.  Patient complained of some chest pain overnight which has since resolved.  She denies any other complaints at this time.  Patient was initially admitted to the hospital with acute hypoxic respiratory failure secondary to acute on chronic combined heart failure exacerbation.  We will increase her Lasix to 80 mg IV twice daily for now.  We will monitor her BMP closely with diuresis.  Strict I's and O's and daily weights.  Cardiology follow-up and recommendations appreciated.  Will restart Entresto per cardiology and stop if she gets recurrent pruritus.  Patient had stopped taking her Entresto secondary to possible reaction with pruritus but this may have been caused by Invokana.  We will monitor her closely.  Of note, telemetry shows multiple sinus pauses and junctional bradycardia.  Carvedilol was DC'd.  If patient has recurrent episodes after 5 half-lives of the beta-blocker she may need EP consult and possible pacemaker placement.
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
## 2018-10-17 NOTE — Care Management Note (Signed)
Case Management Note  Patient Details  Name: Alicia [name] MRN: [id number] Date of Birth: [birth year]  Subjective/Objective:     Resp Failure with Hypoxia              Action/Plan: Oval Linsey, MD; has private insurance with [facility] with prescription drug coverage; CM following for progression of care.  Expected Discharge Date:      Possibly 10/20/2018            Expected Discharge Plan:  [place]  Discharge planning Services  CM Consult  Status of Service:   In progress  Sherrilyn Rist [phone] 10/17/2018, 3:27 PM

## 2018-10-18 ENCOUNTER — Telehealth: Payer: Self-pay

## 2018-10-18 DIAGNOSIS — E113399 Type 2 diabetes mellitus with moderate nonproliferative diabetic retinopathy without macular edema, unspecified eye: Secondary | ICD-10-CM

## 2018-10-18 LAB — GLUCOSE, CAPILLARY
GLUCOSE-CAPILLARY: 250 mg/dL — AB (ref 70–99)
GLUCOSE-CAPILLARY: 202 mg/dL — AB (ref 70–99)
GLUCOSE-CAPILLARY: 143 mg/dL — AB (ref 70–99)
Glucose-Capillary: 181 mg/dL — ABNORMAL HIGH (ref 70–99)

## 2018-10-18 LAB — BASIC METABOLIC PANEL
Anion gap: 7 (ref 5–15)
BUN: 16 mg/dL (ref 6–20)
CALCIUM: 8.5 mg/dL — AB (ref 8.9–10.3)
CO2: 32 mmol/L (ref 22–32)
Chloride: 95 mmol/L — ABNORMAL LOW (ref 98–111)
Creatinine, Ser: 0.68 mg/dL (ref 0.44–1.00)
GFR calc Af Amer: >60 mL/min (ref >60)
Glucose, Bld: 216 mg/dL — ABNORMAL HIGH (ref 70–99)
Potassium: 4.1 mmol/L (ref 3.5–5.1)
Sodium: 134 mmol/L — ABNORMAL LOW (ref 135–145)

## 2018-10-18 LAB — MAGNESIUM: Magnesium: 1.6 mg/dL — ABNORMAL LOW (ref 1.7–2.4)

## 2018-10-18 MED ORDER — AMPICILLIN 500 MG PO CAPS: 500.0000 mg | ORAL_CAPSULE | Freq: Four times a day (QID) | ORAL | 0 refills | Status: DC

## 2018-10-18 MED ORDER — NYSTATIN 100000 UNIT/GM EX POWD: Freq: Three times a day (TID) | CUTANEOUS | 0 refills | Status: DC

## 2018-10-18 MED ORDER — MAGNESIUM SULFATE 4 GM/100ML IV SOLN
4.0000 g | Freq: Once | INTRAVENOUS | Status: AC
  Administered 2018-10-18: 4 g via INTRAVENOUS
  Filled 2018-10-18: qty 100

## 2018-10-18 MED ORDER — SACUBITRIL-VALSARTAN 49-51 MG PO TABS: 1.0000 | ORAL_TABLET | Freq: Two times a day (BID) | ORAL | 0 refills | Status: DC

## 2018-10-18 NOTE — Progress Notes (Signed)
SATURATION QUALIFICATIONS: (This note is used to comply with regulatory documentation for home oxygen)  Patient Saturations on Room Air at Rest 86 %  Patient Saturations on Room Air while Ambulating =did not attempt at this time due to already low sats    Please briefly explain why patient needs home oxygen:pt sats 83-86% on ra, held on walking sat, 2lnc applied and sat 96%, will attept walking sat later

## 2018-10-18 NOTE — Telephone Encounter (Signed)
Hospital TOC per Dr Koleen Distance, discharge 10/18/2018, appt 10/28/2018.

## 2018-10-18 NOTE — Progress Notes (Signed)
Inpatient Diabetes Program Recommendations  AACE/ADA: New Consensus Statement on Inpatient Glycemic Control (2015)  Target Ranges:  Prepandial:   less than 140 mg/dL      Peak postprandial:   less than 180 mg/dL (1-2 hours)      Critically ill patients:  140 - 180 mg/dL   Lab Results  Component Value Date   GLUCAP 250 (H) 10/18/2018   HGBA1C 9.9 (A) 09/16/2018    Review of Glycemic ControlResults for WENDY, HOBACK (MRN [id number]) as of 10/18/2018 11:49  Ref. Range 10/17/2018 12:03 10/17/2018 16:42 10/17/2018 21:48 10/18/2018 08:31 10/18/2018 11:21  Glucose-Capillary Latest Ref Range: 70 - 99 mg/dL 219 (H) 210 (H) 293 (H) 181 (H) 250 (H)   Diabetes history: DM 2 Outpatient Diabetes medications: Lantus 50 units q HS, Humalog 18 units with breakfast, 18 units with lunch and 20 units with dinner, Metformin 1000 mg bid Current orders for Inpatient glycemic control:  Lantus 45 units q HS, Novolog 12 units tid with meals, Novolog resistant tid with meals and HS  Inpatient Diabetes Program Recommendations:   May consider increasing Lantus to home dose of 50 units daily.  Also consider increasing Novolog meal coverage to 15 units tid with meals (hold if patient eats less than 50%).   Thanks,  Adah Perl, RN, BC-ADM Inpatient Diabetes Coordinator Pager [phone] (8a-5p)

## 2018-10-18 NOTE — Discharge Instructions (Signed)
Thank you for allowing [name] to provide your care. This hospitalization we made some changes to your medication. Please stop taking your Coreg. Cardiology has restarted your Entresto. Please continue all other medication as prescribed. It is important that you follow-up with cardiology and the clinic.

## 2018-10-18 NOTE — Care Management Note (Signed)
Case Management Note  Patient Details  Name: Alicia [name] MRN: [id number] Date of Birth: [birth year]  Subjective/Objective:    CHF                Action/Plan: NCM spoke to pt and states she lives at home with dtr. States she has a cane at home. Pt will need oxygen for home. Contacted [employer] with new oxygen order. Will deliver to room this evening for planned dc home tomorrow.   Expected Discharge Date:  10/18/18               Expected Discharge Plan:  Home/Self Care  In-House Referral:  NA  Discharge planning Services  CM Consult  Post Acute Care Choice:  Durable Medical Equipment Choice offered to:  Patient  DME Arranged:  Oxygen DME Agency:  [facility]  HH Arranged:  NA HH Agency:  NA  Status of Service:  Completed, signed off  If discussed at [place] of Stay Meetings, dates discussed:    Additional Comments:  Erenest Rasher, RN 10/18/2018, 4:40 PM

## 2018-10-18 NOTE — Care Management Important Message (Signed)
Important Message  Patient Details  Name: Alicia [name] MRN: [id number] Date of Birth: [birth year]   Medicare Important Message Given:  Yes    Erenest Rasher, RN 10/18/2018, 4:13 PM

## 2018-10-18 NOTE — Progress Notes (Addendum)
Progress Note  Patient Name: Alicia [name] Date of Encounter: 10/18/2018  Primary Cardiologist:  Candee Furbish, MD  Subjective   Has not had any light-headed spells since admit, still SOB w/ minimal exertion  Not able to weigh self, grandchildren keep breaking her scales  Inpatient Medications    Scheduled Meds: . amLODipine  10 mg Oral Daily  . aspirin EC  81 mg Oral Daily  . atorvastatin  40 mg Oral Daily  . enoxaparin (LOVENOX) injection  40 mg Subcutaneous Q24H  . furosemide  80 mg Intravenous BID  . insulin aspart  0-20 Units Subcutaneous TID WC  . insulin aspart  0-5 Units Subcutaneous QHS  . insulin aspart  12 Units Subcutaneous TID WC  . insulin glargine  45 Units Subcutaneous QHS  . nystatin   Topical TID  . sacubitril-valsartan  1 tablet Oral BID   Continuous Infusions: . cefTRIAXone (ROCEPHIN)  IV Stopped (10/18/18 1147)   PRN Meds: acetaminophen **OR** acetaminophen, senna-docusate   Vital Signs    Vitals:   10/18/18 0952 10/18/18 0956 10/18/18 1122 10/18/18 1400  BP:   (!) 144/64   Pulse:   (!) 50   Resp:   19   Temp:   98.4 F (36.9 C)   TempSrc:   Oral   SpO2: (!) 86% 96% 97% 98%  Weight:      Height:        Intake/Output Summary (Last 24 hours) at 10/18/2018 1507 Last data filed at 10/18/2018 1147 Gross per 24 hour  Intake 1160 ml  Output 4500 ml  Net -3340 ml   Filed Weights   10/16/18 0609 10/17/18 0435 10/18/18 0456  Weight: 100.7 kg 102.8 kg 98.9 kg    Telemetry    SR, 2 pauses of almost 3 seconds this am  - Personally Reviewed  ECG    None today - Personally Reviewed  Physical Exam   General: Well developed, well nourished, female appearing in no acute distress. Head: Normocephalic, atraumatic.  Neck: Supple without bruits, JVD not seen elevated, difficult to assess 2nd body habitus. Lungs:  Resp regular and unlabored, some rales bases  Heart: RRR, S1, S2, no S3, S4, or murmur; no rub. Abdomen: Soft, non-tender,  non-distended with normoactive bowel sounds. No hepatomegaly. No rebound/guarding. No obvious abdominal masses. Extremities: No clubbing, cyanosis, trace-1+ LLE edema. Distal pedal pulses are 2+ bilaterally. Neuro: Alert and oriented X 3. Moves all extremities spontaneously. Psych: Normal affect.  Labs    Hematology Recent Labs  Lab 10/14/18 1125  WBC 6.6  RBC 4.69  HGB 12.2  HCT 42.2  MCV 90.0  MCH 26.0  MCHC 28.9*  RDW 18.3*  PLT 321    Chemistry Recent Labs  Lab 10/16/18 0419 10/17/18 0427 10/18/18 0539  NA 136 135 134*  K 4.5 3.9 4.1  CL 96* 94* 95*  CO2 32 33* 32  GLUCOSE 161* 145* 216*  BUN 20 35* 16  CREATININE 0.75 1.06* 0.68  CALCIUM 8.3* 8.4* 8.5*  GFRNONAA >60 56* >60  GFRAA >60 >60 >60  ANIONGAP 8 8 7      Cardiac Enzymes Recent Labs  Lab 10/16/18 2357 10/17/18 0427 10/17/18 1033  TROPONINI 0.03* 0.04* 0.04*      BNP Recent Labs  Lab 10/14/18 1125  BNP 226.7*     Radiology    X-ray Chest Pa And Lateral  Result Date: 10/14/2018 CLINICAL DATA:  Hypoxia. EXAM: CHEST - 2 VIEW COMPARISON:  Chest x-ray dated 03/06/2018.  FINDINGS: Stable cardiomegaly. Overall cardiomediastinal silhouette is stable. Stable chronic scarring/atelectasis within the LEFT lower lung. No new lung findings. No pleural effusion or pneumothorax seen. No acute or suspicious osseous finding. IMPRESSION: No active cardiopulmonary disease. No evidence of pneumonia or pulmonary edema. Stable cardiomegaly. Electronically Signed   By: Franki Cabot M.D.   On: 10/14/2018 20:20    Cardiac Studies   Echocardiogram 06/23/2018: Study Conclusions: - Left ventricle: The cavity size was moderately dilated. There was moderate concentric hypertrophy. Systolic function was mildly reduced. The estimated ejection fraction was in the range of 45% to 50%. Diffuse hypokinesis. Doppler parameters are consistent with both elevated ventricular end-diastolic filling pressure  and elevated left atrial filling pressure. - Left atrium: The atrium was moderately dilated.  Patient Profile     62 y.o. female w/ hx non-obs CAD 2015 cath, S-CHF, NICM, HTN, HLD, DM2, CVA, tob use, was admitted 11/25 w/ CHF exacerbation  Assessment & Plan   1. Acute on Chronic Systolic CHF - wt has been 216 or more since 07/2018, before then was generally 211 or less - I/O net neg 7.6 L since admit - continue to follow daily wts and I/O  - hopefully can change to po Lasix 24-48 hr  2. Sinus pauses: - pta on Coreg 25 mg bid, last dose 11/24 am - still w/ almost 3 sec pauses - continue to follow on telemetry - MD to review strips  3. NICM:  - Entresto restarted 11/25 - continue other rx except BB, BP is tolerating  Otherwise, per IM  Principal Problem:   Respiratory failure with hypoxia ([place]) Active Problems:   Type 2 diabetes mellitus with moderate nonproliferative diabetic retinopathy (HCC)   Essential hypertension   Acute on chronic combined systolic and diastolic heart failure (HCC)   UTI (urinary tract infection)   Sinus pause  Bobby Rumpf , PA-C 3:07 PM 10/18/2018 Pager: [phone]  Personally seen and examined. Agree with above.  Telemetry reviewed, 3-second pauses once again during the day.  Asymptomatic.  Her carvedilol was stopped 11/24 in the AM.  Since she is asymptomatic with this, I would not pursue pacemaker therapy at this point.  She also still has more time to clear her carvedilol.  Good diuresis yesterday.  Continue with Entresto and Lasix today.  She did decrease her oxygen saturation walking.  Probably still has more fluid to go.  I wonder if case management can help her get a scale.    Candee Furbish, MD

## 2018-10-18 NOTE — Progress Notes (Signed)
   Subjective: Patient states that she feels better than ever. Got some good sleep last night. Wants to know if she can take the oxygen off. Wanting to go home. Restarted Entresto yesterday without adverse effects. Discussed that we will try to wean her to room air, coordinate care with cardiology, and try to get her home today. Discussed with the holidays coming up to keep her fluid intake <1532mL and limit salt intake. She voices understanding.   Objective: Vital signs in last 24 hours: Vitals:   10/17/18 0917 10/17/18 1205 10/17/18 2046 10/18/18 0456  BP: 135/65 (!) 158/74 (!) 150/65 132/71  Pulse: 70 73 70 66  Resp:  17 20 20   Temp:  98.9 F (37.2 C) 98.5 F (36.9 C) 98.6 F (37 C)  TempSrc:  Oral Oral Oral  SpO2:  100% 97% 96%  Weight:    98.9 kg  Height:       General: awake, alert, very pleasant female sitting up in bed in NAD CV: RRR; no murmurs, rubs or gallops Pulm: good air movement throughout without wheezes or crackles Abd: BS+; abdomen is soft, non-distended, non-tender Extremities: mild pitting edema in bilateral lower extremities   Assessment/Plan:  Principal Problem:   Respiratory failure with hypoxia (HCC) Active Problems:   Type 2 diabetes mellitus with moderate nonproliferative diabetic retinopathy (HCC)   Essential hypertension   Acute on chronic combined systolic and diastolic heart failure (HCC)   UTI (urinary tract infection)   Sinus pause  Alicia [name] is a 62 year old female with a past medical history of hypertension, type 2 diabetes and systolic heart failure that presented to the [facility] for progressively worsening DOE, orthopnea and lower extremity edema. Found to be hypoxic on room air and volume overloaded on exam so was subsequently admitted for acute heart failure exacerbation.   1. Acute hypoxic respiratory failure 2/2 acute combined heart failure exacerbation -Shortness of breath improving; now on 2L nasal cannula  -Patient had net -4.6L  yesterday. Weight down 4Kg - Continue IV furosemide to 80mg  BID  - Telemetry reviewed which showed NSR without any further pauses.  -Renal function and electrolytes stable; continue to trend daily -Strict I&Os and daily weights - Continue Entresto  - Will discuss discharge with Cardiology today.    2. Sinus pause, >5secs - Holding Coreg - No further sinus pauses since stopping Coreg  - Appreciate cardiology recommendations   3.Uncontrolled type 2 diabetes on insulin -Patient's CBGs much better controlled with adding meal time insulin  -Lantus45 unitsdaily at bedtime - 12units NovoLog with meals+ SSI   4. HTN:  -Blood pressure stable on amlodipine; holding carvedilol   5. Urinary Tract Infection  - Urine cultures growing Proteus sensitive to Ceftriaxone  -Continueceftriaxone for 2 more days to complete 5 day course   Dispo: Anticipated discharge in approximately 0-1 day(s) pending cardiology recommendations.   Modena Nunnery D, DO 10/18/2018, 6:46 AM Pager: [phone]

## 2018-10-18 NOTE — Progress Notes (Signed)
Internal Medicine Attending:   I saw and examined the patient. I reviewed the resident's note and I agree with the resident's findings and plan as documented in the resident's note.  Patient states that she feels well today with no new complaints.  She states that her shortness of breath is improved.  Patient was initially admitted to the hospital with progressively worsening dyspnea on exertion secondary to acute hypoxic respiratory failure from an acute on chronic combined heart failure exacerbation.  Her shortness of breath is now improving.  Patient is net negative approximately 4.6 L yesterday.  Continue with IV Lasix 80 mg twice daily for now.  Patient still requiring O2 at rest (O2 sat is 86% on room air at rest).  Cardiology follow-up and recommendations appreciated.  Patient still with some sinus pauses (but less than 3 seconds).  Continue to hold beta-blocker for now.  Continue to monitor the patient on telemetry.  If patient continues to improve she should likely be stable for discharge home tomorrow.

## 2018-10-18 NOTE — Progress Notes (Signed)
SATURATION QUALIFICATIONS: (This note is used to comply with regulatory documentation for home oxygen)  Patient Saturations on Room Air at Rest = 93 %  Patient Saturations on Room Air while Ambulating = 84 %  Patient Saturations on 2Liters of oxygen while Ambulating = 93%  Please briefly explain why patient needs home oxygen:pt sob required rest for brief period, up to 98% on 2l/[place] after sitting resting

## 2018-10-19 DIAGNOSIS — Z888 Allergy status to other drugs, medicaments and biological substances status: Secondary | ICD-10-CM

## 2018-10-19 LAB — BASIC METABOLIC PANEL
Anion gap: 7 (ref 5–15)
BUN: 16 mg/dL (ref 6–20)
CO2: 35 mmol/L — ABNORMAL HIGH (ref 22–32)
Calcium: 9 mg/dL (ref 8.9–10.3)
Chloride: 94 mmol/L — ABNORMAL LOW (ref 98–111)
Creatinine, Ser: 0.78 mg/dL (ref 0.44–1.00)
Glucose, Bld: 175 mg/dL — ABNORMAL HIGH (ref 70–99)
Potassium: 4 mmol/L (ref 3.5–5.1)
Sodium: 136 mmol/L (ref 135–145)

## 2018-10-19 LAB — GLUCOSE, CAPILLARY
GLUCOSE-CAPILLARY: 163 mg/dL — AB (ref 70–99)
Glucose-Capillary: 192 mg/dL — ABNORMAL HIGH (ref 70–99)

## 2018-10-19 LAB — MAGNESIUM: MAGNESIUM: 2.1 mg/dL (ref 1.7–2.4)

## 2018-10-19 MED ORDER — FUROSEMIDE 40 MG PO TABS: 40.0000 mg | ORAL_TABLET | Freq: Two times a day (BID) | ORAL | 2 refills | Status: DC

## 2018-10-19 NOTE — Progress Notes (Signed)
Pt discharge home in stable condition via wheelchair, avs was given and reviewed, reviewed living with heart failure booklet, medicaitons, s/sx of heart failure and management, low sodium diet and 1.5l fluid restriction, importance of daily weights and taking medications, verbalized understanding

## 2018-10-19 NOTE — Progress Notes (Signed)
   Subjective: Alicia [name] was seen and evaluated at bedside. No acute events overnight. She is feeling much better and oxygenating well on room air. Denies shortness of breath, presyncope, or chest pain. Patient is anxious to go home. Discussed plan for discharge today with close outpatient follow-up with Dr. Eppie Gibson and Dr. Marlou Porch. Also will have O2 sent home with her in case she needs any at home with exertion.   Objective:  Vital signs in last 24 hours: Vitals:   10/19/18 0509 10/19/18 1004 10/19/18 1020 10/19/18 1139  BP: (!) 145/78 133/90  121/66  Pulse: 77 62  60  Resp: 18   20  Temp: 98.5 F (36.9 C)   98.6 F (37 C)  TempSrc: Oral   Oral  SpO2: 98% 98% 94% 92%  Weight: 99 kg     Height:       General: awake, alert, very pleasant female sitting up in bed in NAD CV: RRR; no murmurs, rubs or gallops Pulm: good air movement throughout without wheezes or crackles  Abd: BS+; abdomen is soft, non-distended, non-tender Ext: mild pitting edema bilateral lower extremities   Assessment/Plan:  Principal Problem:   Respiratory failure with hypoxia (HCC) Active Problems:   Type 2 diabetes mellitus with moderate nonproliferative diabetic retinopathy (HCC)   Essential hypertension   Acute on chronic combined systolic and diastolic heart failure (HCC)   UTI (urinary tract infection)   Sinus pause  Alicia [name] is a 62 year old female with a past medical history of hypertension, type 2 diabetes and systolic heart failure that presented to the [facility] for progressively worsening DOE, orthopnea and lower extremity edema. Found to be hypoxic on room air and volume overloaded on exam so was subsequently admitted for acute heart failure exacerbation.   1. Acute hypoxic respiratory failure 2/2 acute combined heart failure exacerbation -Shortness of breath improved;saturating well on room air  -Patient with net negative of 8.5 L since admission; has dropped 3 kg; weight at discharge is 99  kg -Renal function and electrolytes stable -Continue Entresto; increase PO lasix to 40 mg BID  2.Sinus pause, >5secs - resolved  - Telemetry reviewed; no significant pauses  - will continue to hold Carvedilol at discharge  - greatly appreciate cardiology recommendations   3.Uncontrolled type 2 diabetes on insulin -Patient's CBGsmuch better controlled with adding meal time insulin - will continue home regimen at discharge   4. HTN:  -Blood pressure stableonamlodipine; holding carvedilol  5. Urinary Tract Infection  -Urine cultures growing Proteus sensitive to Ceftriaxone  -completed course of antibiotic therapy while inpatient; no further abx necessary   Dispo: Anticipated discharge home today.   Modena Nunnery D, DO 10/19/2018, 1:03 PM Pager: [phone]

## 2018-10-19 NOTE — Progress Notes (Signed)
Internal Medicine Attending:   I saw and examined the patient. I reviewed the resident's note and I agree with the resident's findings and plan as documented in the resident's note.  Patient states that she feels well today and wants to go home.  Patient was initially admitted to the hospital with hypoxic respiratory failure secondary to acute on chronic combined heart failure exacerbation.  Patient is net negative approximately 8.5 L since admission.  Her creatinine has remained stable.  She still noted to have mild lower extremity edema on exam (left greater than right).  Cardiology follow-up recommendations appreciated.  We will continue with Entresto.  We will increase her home Lasix to 40 mg twice daily for now.  Patient to follow-up with [facility] as well as cardiology as an outpatient.  Patient was also noted to have sinus pauses greater than 5 seconds on telemetry.  Her beta-blocker was held at discharge.  She has had no further pauses over the last 24 hours.  No further work-up at this time.  Patient stable for discharge home today.

## 2018-10-19 NOTE — Discharge Summary (Signed)
Name: Alicia [name] MRN: [id number] DOB: September 11, 1956 62 y.o. PCP: Oval Linsey, MD  Date of Admission: 10/14/2018  2:00 PM Date of Discharge: 10/19/2018 Attending Physician: No att. providers found  Discharge Diagnosis: 1. Acute Hypoxic Respiratory Failure 2/2 an Acute on Chronic Systolic/Diastolic Heart Failure Exacerbation.  2. Sinus Pause  3. Acute Symptomatic UTI  Discharge Medications: Allergies as of 10/19/2018      Reactions   Canagliflozin Itching, Anxiety, Palpitations      Medication List    STOP taking these medications   carvedilol 25 MG tablet Commonly known as:  COREG   doxycycline 100 MG tablet Commonly known as:  VIBRA-TABS     TAKE these medications   accu-chek multiclix lancets Use as instructed   amLODipine 10 MG tablet Commonly known as:  NORVASC Take 1 tablet (10 mg total) by mouth daily.   aspirin 81 MG tablet Take 81 mg by mouth daily.   atorvastatin 40 MG tablet Commonly known as:  LIPITOR TAKE 1 TABLET BY MOUTH EVERY DAY   BYDUREON 2 MG Srer Generic drug:  Exenatide ER Inject 1 mL into the skin once a week. Saturdays   furosemide 40 MG tablet Commonly known as:  LASIX Take 1 tablet (40 mg total) by mouth 2 (two) times daily. What changed:  when to take this   glucose blood test strip USE TO TEST BLOOD SUGARS AS DIRECTED BY PHYSICIAN   Insulin Glargine 100 UNIT/ML Solostar Pen Commonly known as:  LANTUS Inject 50 Units into the skin daily at 10 pm.   insulin lispro 100 UNIT/ML KiwkPen Commonly known as:  HUMALOG Use 18 units before breakfast and lunch and 20 units before dinner.   Insulin Pen Needle 32G X 4 MM Misc Use to inject insulin 4 times a day. The patient is insulin requiring, ICD 10 code 11.10. The patient injects 4 times per day.   metFORMIN 500 MG 24 hr tablet Commonly known as:  GLUCOPHAGE-XR Take 1,000 mg by mouth 2 (two) times daily.   nystatin powder Commonly known as:  MYCOSTATIN/NYSTOP Apply  topically 3 (three) times daily.   omeprazole 20 MG capsule Commonly known as:  PRILOSEC Take 1 capsule (20 mg total) by mouth daily.   sacubitril-valsartan 49-51 MG Commonly known as:  ENTRESTO Take 1 tablet by mouth 2 (two) times daily.     Disposition and follow-up:   Ms.Reannon M Levingston was discharged from [facility] in Stable condition.  At the hospital follow up visit please address:  1.  HF. Continue to discuss lifestyle and dietary modifications for patient's with HF. Ensure she is not experiencing any adverse effect due to the [facility]. Ensure she is no longer taking carvedilol. Discuss that if her weight increases by >3lbs in 1 day or >5 in 2 days that she should increase her lasix. UTI. Discuss whether she is still having symptoms and has completed her Abx course.   2.  Labs / imaging needed at time of follow-up: BMP  3.  Pending labs/ test needing follow-up: None  Follow-up Appointments: Follow-up Information    [name], MD.   Specialty:  Cardiology Contact information: [phone] [address] [place] [address] [phone]        Oval Linsey, MD On 10/28/2018.   Specialty:  Internal Medicine Why:  patient has appt. for Dec 6 at 9:15am Contact information: [address] [place] [address] [phone]  Follow up On 11/08/2018.   Why:  appt. at 9:40am scheduled with [facility] Follow up.   Why:  will deliver portable to room and concentrator to home  Contact information: [place] [address] [phone]         Hospital Course by problem list:  Acute Hypoxic Respiratory Failure 2/2 an Acute on Chronic Systolic/Diastolic Heart Failure Exacerbation. [name] is a 62 year old female with hypertension, uncontrolled type II diabetes mellitus, and HFrEF (LVEF 45-50 and LAE, previous LVEF 20-25%) who presented to the [facility] with worsening exertional  dyspnea, orthopnea, and lower extremity edema. She was subsequently found to be hypoxic on room air and admitted for an acute heart failure exacerbation. She was aggressively diuresis with IV furosemide 80 mg BID. Over the course of her hospitalization she diuresed approximately 9 L and her weight decreased approximately 4 kg. She was able to be weaned to room air and no longer requiring oxygen therapy. Cardiology was consulted during the course of her hospitalization secondary to sinus pauses (outlined below). He recommended discontinuation of her carvedilol, restarting interest, and increasing her home Lasix 40 mg twice daily. The etiology of her HF exacerbation was felt to be secondary to dietary nonadherence. Education was provided and a HF action plan was discussed. No other medication changes were made and the patient was discharged in stable condition with follow-up with her primary care provider and cardiologist.  Sinus Pause. While admitted for her acute heart failure exacerbation the patient was noted to have sinus pauses in excess of five seconds. Her carvedilol was stopped and cardiology was consulted for further recommendations. After watchful waiting for greater than 48 hours the patient's sinus pauses decreased in both frequency and duration. Cardiology felt that there was no need for EP consult at this time and recommended discontinuing carvedilol and avoiding beta blockers in this patient.  Acute Symptomatic UTI. On admission the patient voiced concerns about dysuria and polyuria. UA was consistent with a urinary tract infection and culture subsequently grew Proteus. She was treated with IV ceftriaxone while in the hospital and transition to amoxicillin on discharge. She will complete a five day course.  Discharge Vitals:   BP 121/66 (BP Location: Left Arm)   Pulse 60   Temp 98.6 F (37 C) (Oral)   Resp 20   Ht 5\' 5"  (1.651 m)   Wt 99 kg   LMP  (LMP Unknown)   SpO2 92%   BMI 36.33  kg/m   Discharge Instructions: Discharge Instructions    (HEART FAILURE PATIENTS) Call MD:  Anytime you have any of the following symptoms: 1) 3 pound weight gain in 24 hours or 5 pounds in 1 week 2) shortness of breath, with or without a dry hacking cough 3) swelling in the hands, feet or stomach 4) if you have to sleep on extra pillows at night in order to breathe.   Complete by:  As directed    Diet - low sodium heart healthy   Complete by:  As directed    Discharge instructions   Complete by:  As directed    Ms. Steinhaus, It was an absolute pleasure taking care of you! You have been treated for an exacerbation of your heart failure. We were able to get a lot of fluid off, and I am glad you are feeling better. Please increase your Lasix to 40 mg TWICE daily. You have also  been started back on Entresto which should also help with your heart function. Please STOP taking Carvedilol as this was causing your heart rhythm to be slightly abnormal.  I know it will be especially challenging around the holidays, but try to avoid excessive salt and fluid intake (less than 1.5 L like we discussed) as best you can.  Your oxygen levels look great today! We are sending you home with oxygen in case you need it with more exertion.  You will see Dr. Eppie Gibson next [place], and Dr. Marlou Porch will also schedule you for follow-up with his office.  Take care, and I hope you have a lovely Thanksgiving!  Dr. Koleen Distance   Increase activity slowly   Complete by:  As directed    Increase activity slowly   Complete by:  As directed     Signed: Ina Homes, MD 10/20/2018, 9:29 AM   Pager: [phone]

## 2018-10-19 NOTE — Progress Notes (Addendum)
Progress Note  Patient Name: Alicia [name] Date of Encounter: 10/19/2018  Primary Cardiologist:  Candee Furbish, MD  Subjective   Still on O2, no more dizziness, no presyncope. Breathing better, wants to go home.  Inpatient Medications    Scheduled Meds: . amLODipine  10 mg Oral Daily  . aspirin EC  81 mg Oral Daily  . atorvastatin  40 mg Oral Daily  . enoxaparin (LOVENOX) injection  40 mg Subcutaneous Q24H  . furosemide  80 mg Intravenous BID  . insulin aspart  0-20 Units Subcutaneous TID WC  . insulin aspart  0-5 Units Subcutaneous QHS  . insulin aspart  12 Units Subcutaneous TID WC  . insulin glargine  45 Units Subcutaneous QHS  . nystatin   Topical TID  . sacubitril-valsartan  1 tablet Oral BID   Continuous Infusions: . cefTRIAXone (ROCEPHIN)  IV Stopped (10/18/18 1147)   PRN Meds: acetaminophen **OR** acetaminophen, senna-docusate   Vital Signs    Vitals:   10/18/18 1122 10/18/18 1400 10/18/18 1921 10/19/18 0509  BP: (!) 144/64  (!) 142/79 (!) 145/78  Pulse: (!) 50  (!) 52 77  Resp: 19  19 18   Temp: 98.4 F (36.9 C)  98.3 F (36.8 C) 98.5 F (36.9 C)  TempSrc: Oral  Oral Oral  SpO2: 97% 98% 98% 98%  Weight:    99 kg  Height:        Intake/Output Summary (Last 24 hours) at 10/19/2018 0848 Last data filed at 10/19/2018 0513 Gross per 24 hour  Intake 1280 ml  Output 1800 ml  Net -520 ml   Filed Weights   10/17/18 0435 10/18/18 0456 10/19/18 0509  Weight: 102.8 kg 98.9 kg 99 kg    Telemetry    SR, No pauses in >24 hr - Personally Reviewed  ECG    None today - Personally Reviewed  Physical Exam   General: Well developed, well nourished, female in no acute distress Head: Eyes PERRLA, No xanthomas.   Normocephalic and atraumatic Lungs: some rales bases to auscultation. Heart: HRRR S1 S2, without RG. Soft SEM  Pulses are 2+ & equal. No JVD elevation seen. Abdomen: Bowel sounds are present, abdomen soft and non-tender without masses or   hernias noted. Msk: Normal strength and tone for age. Extremities: No clubbing, cyanosis or edema.    Skin:  No rashes or lesions noted. Neuro: Alert and oriented X 3. Psych:  Good affect, responds appropriately  Labs    Hematology Recent Labs  Lab 10/14/18 1125  WBC 6.6  RBC 4.69  HGB 12.2  HCT 42.2  MCV 90.0  MCH 26.0  MCHC 28.9*  RDW 18.3*  PLT 321    Chemistry Recent Labs  Lab 10/17/18 0427 10/18/18 0539 10/19/18 0525  NA 135 134* 136  K 3.9 4.1 4.0  CL 94* 95* 94*  CO2 33* 32 35*  GLUCOSE 145* 216* 175*  BUN 35* 16 16  CREATININE 1.06* 0.68 0.78  CALCIUM 8.4* 8.5* 9.0  GFRNONAA 56* >60 >60  GFRAA >60 >60 >60  ANIONGAP 8 7 7      Cardiac Enzymes Recent Labs  Lab 10/16/18 2357 10/17/18 0427 10/17/18 1033  TROPONINI 0.03* 0.04* 0.04*      BNP Recent Labs  Lab 10/14/18 1125  BNP 226.7*     Radiology    No results found.  Cardiac Studies   Echocardiogram 06/23/2018: Study Conclusions: - Left ventricle: The cavity size was moderately dilated. There was moderate concentric hypertrophy. Systolic  function was mildly reduced. The estimated ejection fraction was in the range of 45% to 50%. Diffuse hypokinesis. Doppler parameters are consistent with both elevated ventricular end-diastolic filling pressure and elevated left atrial filling pressure. - Left atrium: The atrium was moderately dilated.  Patient Profile     62 y.o. female w/ hx non-obs CAD 2015 cath, S-CHF, NICM, HTN, HLD, DM2, CVA, tob use, was admitted 11/25 w/ CHF exacerbation  Assessment & Plan   1. Acute on Chronic Systolic CHF. - I/O net net 8.5 L since admit\ - wt no change overnight - volume status improved by exam - see if Case Mgr can get her scales - hopefully can change to po Lasix soon  2. Sinus pauses: - now off Coreg 25 mg > 48 hr - pauses and bradycardia have resolved - MD advise starting BB at low dose or avoiding all together  3. NICM:  -  Entresto restarted 11/25, pt tolerating well. - SBP 130s-140s on current rx - not sure BP will tolerate going from 49-51>>97-103, it may be too soon to try  Otherwise, per IM  Principal Problem:   Respiratory failure with hypoxia ([place]) Active Problems:   Type 2 diabetes mellitus with moderate nonproliferative diabetic retinopathy ([facility])   Essential hypertension   Acute on chronic combined systolic and diastolic heart failure ([facility])   UTI (urinary tract infection)   Sinus pause  Jonetta Speak , PA-C 8:48 AM 10/19/2018 Pager: [phone]  Personally seen and examined. Agree with above.  We will go ahead and avoid beta-blocker (carvedilol) because of significant pauses and bradycardia previously.  Telemetry reassuring.  Continue with Entresto.  For now, continue with moderate dose.  As outpatient may wish to increase to maximum dose  She will be walking the hallway seen.  She is off of oxygen currently.  Hopefully she will be ready for discharge today.  Please let us know we can be of further assistance.  [facility] will sign off.   Medication Recommendations: On discharge, I would place on Lasix 40 mg twice daily.  Make sure that she stops her carvedilol.  Make sure that she has Entresto Other recommendations (labs, testing, etc): Continue to monitor basic metabolic profile Follow up as an outpatient: We will set up follow-up

## 2018-10-20 ENCOUNTER — Other Ambulatory Visit: Payer: Self-pay | Admitting: Internal Medicine

## 2018-10-20 DIAGNOSIS — E119 Type 2 diabetes mellitus without complications: Secondary | ICD-10-CM

## 2018-10-20 DIAGNOSIS — E113393 Type 2 diabetes mellitus with moderate nonproliferative diabetic retinopathy without macular edema, bilateral: Secondary | ICD-10-CM

## 2018-10-28 ENCOUNTER — Ambulatory Visit (INDEPENDENT_AMBULATORY_CARE_PROVIDER_SITE_OTHER): Payer: Medicare Other | Admitting: Internal Medicine

## 2018-10-28 ENCOUNTER — Encounter: Payer: Self-pay | Admitting: Internal Medicine

## 2018-10-28 VITALS — BP 134/73 | HR 78 | Temp 98.5°F | Wt 216.4 lb

## 2018-10-28 DIAGNOSIS — E113393 Type 2 diabetes mellitus with moderate nonproliferative diabetic retinopathy without macular edema, bilateral: Secondary | ICD-10-CM

## 2018-10-28 DIAGNOSIS — I10 Essential (primary) hypertension: Secondary | ICD-10-CM | POA: Diagnosis not present

## 2018-10-28 DIAGNOSIS — F17200 Nicotine dependence, unspecified, uncomplicated: Secondary | ICD-10-CM

## 2018-10-28 DIAGNOSIS — I5022 Chronic systolic (congestive) heart failure: Secondary | ICD-10-CM

## 2018-10-28 DIAGNOSIS — E669 Obesity, unspecified: Secondary | ICD-10-CM

## 2018-10-28 DIAGNOSIS — Z23 Encounter for immunization: Secondary | ICD-10-CM

## 2018-10-28 DIAGNOSIS — K219 Gastro-esophageal reflux disease without esophagitis: Secondary | ICD-10-CM

## 2018-10-28 DIAGNOSIS — I700 Atherosclerosis of aorta: Secondary | ICD-10-CM

## 2018-10-28 MED ORDER — INSULIN GLARGINE 100 UNIT/ML SOLOSTAR PEN: 50.0000 [IU] | PEN_INJECTOR | Freq: Every day | SUBCUTANEOUS | 3 refills | Status: DC

## 2018-10-28 MED ORDER — FUROSEMIDE 40 MG PO TABS: 40.0000 mg | ORAL_TABLET | Freq: Two times a day (BID) | ORAL | 3 refills | Status: DC

## 2018-10-28 NOTE — Patient Instructions (Signed)
It was great to see you again.  I am so happy you are feeling better and I am so happy for your daughter!  1) Keep take your medications just like you are.  2) Keep working on quitting smoking.  You are doing well.  3) I gave you a pneumonia shot.  4) I check some blood today.  I will call you on Monday with the results.  I will see you in 2 months, sooner if necessary.

## 2018-10-28 NOTE — Assessment & Plan Note (Signed)
Assessment  We continue to aggressively manage her blood pressure and diabetes.  In addition, she has decreased the amount of tobacco she uses and is working on further cessation.  She also is continuing with her aspirin 81 mg by mouth daily.  She denies any claudication.  Plan  We will continue aggressive risk factor modification as outlined above and reassess for symptoms of claudication at the follow-up visit.

## 2018-10-28 NOTE — Assessment & Plan Note (Signed)
Assessment  She has stopped taking her PPI therapy and has not had recurrence of symptoms consistent with gastroesophageal reflux disease.  She asked that her PPI be removed from her medication list as she is no longer taking it.  Plan  The omeprazole was removed from her medication list as she is no longer taking this medication.  At the follow-up visit we will reassess for evidence of recurrence of gastroesophageal reflux disease symptoms off of the PPI therapy.

## 2018-10-28 NOTE — Assessment & Plan Note (Signed)
Assessment  Her recent hospitalization helped her to realize the importance of tobacco cessation and she is now in the contemplative stage.  She is down to 3 cigarettes/day and is continuing to try to stop altogether.  She is not interested in any pharmacologic therapy at this time.  Plan  I praised her for her excellent progress in decreasing her tobacco use and her intention to quit altogether.  I encouraged her to continue in these efforts given the importance to her health.  We will reassess the success she is having on complete tobacco cessation at the follow-up visit.

## 2018-10-28 NOTE — Progress Notes (Signed)
   Subjective:    Patient ID: Alicia [name], female    DOB: August 30, 1956, 62 y.o.   MRN: [id number]  HPI  Alicia [name] is here for hospital follow-up after a recent episode of acute on chronic systolic heart failure, as well as follow-up of her chronic medical conditions including essential hypertension, type 2 diabetes with moderate nonproliferative diabetic retinopathy, tobacco abuse, aortic atherosclerosis, gastroesophageal reflux disease, and obesity. Please see the A&P for the status of the pt's chronic medical problems.  Symptomatically she is much improved since discharge.  She denies any dyspnea on exertion, orthopnea, or paroxysmal nocturnal dyspnea.  She is sleeping well and tolerating her medications without side effects.  She is without other acute complaints.  Review of Systems  Constitutional: Negative for activity change, appetite change and unexpected weight change.  Respiratory: Negative for cough, chest tightness, shortness of breath and wheezing.   Cardiovascular: Negative for chest pain, palpitations and leg swelling.  Musculoskeletal: Negative for gait problem, joint swelling and myalgias.  Skin: Positive for rash.       Chronic extensor surface plaques consistent with her psoriasis.  Neurological: Negative for weakness and light-headedness.  Psychiatric/Behavioral: Negative for dysphoric mood and sleep disturbance.      Objective:   Physical Exam  Constitutional: She is oriented to person, place, and time. She appears well-developed and well-nourished. No distress.  HENT:  Head: Normocephalic and atraumatic.  Eyes: Conjunctivae are normal. Right eye exhibits no discharge. Left eye exhibits no discharge. No scleral icterus.  Cardiovascular: Normal rate, regular rhythm and normal heart sounds. Exam reveals no gallop and no friction rub.  No murmur heard. Pulmonary/Chest: Effort normal and breath sounds normal. No stridor. No respiratory distress. She has no wheezes.  She has no rales.  Musculoskeletal: Normal range of motion. She exhibits no edema, tenderness or deformity.  Neurological: She is alert and oriented to person, place, and time. She exhibits normal muscle tone.  Skin: Skin is warm and dry. Rash noted. She is not diaphoretic. No erythema. No pallor.  Plaques with hyperkeratosis on the external malleoli, knees, elbows, and knuckles bilaterally  Psychiatric: She has a normal mood and affect. Her behavior is normal. Judgment and thought content normal.  Nursing note and vitals reviewed.     Assessment & Plan:   Please see problem oriented charting.

## 2018-10-28 NOTE — Assessment & Plan Note (Signed)
Assessment  Her most recent hemoglobin A1c was 9.9.  This was within the last month.  She has made notable changes to her diet stating she is eating many more vegetables and limiting her breads, potatoes, and sugary drinks.  Unfortunately, she is received an E7 code on her glucometer and therefore has been unable to measure her glucoses.  This is related to a low battery and she was informed of this and asked that she get a new battery from the drugstore.  This will allow her to further monitor her blood sugars at home.  In the meantime we will continue the bydurean 1 mL weekly, Lantus 50 units daily, Humalog 18 units prior to breakfast and lunch and 20 units prior to dinner, and metformin XR 1000 mg by mouth twice daily.  A diabetic foot exam was completed today.  Plan  She will continue with the dietary modifications in hopes of improving her glycemic control.  In addition she will continue the regimen as outlined above.  Upon receiving a new battery she will restart checking her blood sugars so that at the follow-up appointment we can make further adjustments as needed.  She is up-to-date on her diabetes associated healthcare maintenance.

## 2018-10-28 NOTE — Telephone Encounter (Signed)
Pt was seen in office today, 10/28/18. SChaplin, RN,BSN

## 2018-10-28 NOTE — Assessment & Plan Note (Signed)
Assessment  Her blood pressure today was 134/73.  Although within target it is higher than I would like given her underlying heart failure and retinopathy.  Her current regimen includes amlodipine 10 mg by mouth daily and Entresto 49-51 mg 1 tablet twice daily.  The beta-blocker had to be stopped because of sinus pauses seen on telemetry during her recent hospital admission.  I am therefore somewhat hesitant at this time to add an additional agent since a nodal agent is contraindicated.  Plan  We will continue the current regimen and reassess the blood pressure at the follow-up visit.  If it remains much above 217 systolic we will consider an additional agent.  As she is on twice daily Lasix she is getting some antihypertensive benefit from this dosing.

## 2018-10-28 NOTE — Assessment & Plan Note (Signed)
Assessment  Her weight today is 216 pounds down from 218 pounds at discharge.  She has made some changes in her diet that should result in further continued weight loss.  Plan  She was praised on her efforts at lifestyle modification in order to address her obesity.  We will reassess the success of these lifestyle modifications with a repeat weight at the follow-up visit.

## 2018-10-28 NOTE — Assessment & Plan Note (Signed)
A Prevnar 13 and foot exam were done today.  At the follow-up visit we will reassess her interest in colon cancer screening.

## 2018-10-28 NOTE — Assessment & Plan Note (Signed)
Assessment  She is symptomatically markedly improved on her current regimen that includes Lasix 40 mg by mouth twice daily and Entresto 49-51 mg 1 tablet twice daily.  She is not on a beta-blocker because she had sinus pauses while on metoprolol and this was discontinued during her hospital admission.  She denies any orthopnea, paroxysmal nocturnal dyspnea, or edema.  Her weight was 216 pounds down approximately 2 pounds from discharge.  She was able to walk up and down the halls on room air with a pulse oximeter with no dyspnea.  The lowest her O2 saturation dropped to well on room air was 91% while sitting after walking the hallways.  Thus, she no longer qualifies for home oxygen therapy.  Plan  We will continue the Lasix 40 mg by mouth twice daily and Entresto 49-51 mg 1 tablet twice daily.  The home oxygen therapy will be discontinued.  A basic metabolic panel was obtained today and is pending at the time of this dictation.  We will reassess the efficacy of this regimen at the follow-up visit.

## 2018-10-29 LAB — BMP8+ANION GAP
ANION GAP: 21 mmol/L — AB (ref 10.0–18.0)
BUN / CREAT RATIO: 20 (ref 12–28)
BUN: 18 mg/dL (ref 8–27)
CO2: 24 mmol/L (ref 20–29)
CREATININE: 0.91 mg/dL (ref 0.57–1.00)
Calcium: 9.7 mg/dL (ref 8.7–10.3)
Chloride: 95 mmol/L — ABNORMAL LOW (ref 96–106)
GFR calc Af Amer: 79 mL/min/{1.73_m2} (ref >59)
GFR calc non Af Amer: 69 mL/min/{1.73_m2} (ref >59)
Glucose: 98 mg/dL (ref 65–99)
Potassium: 4.5 mmol/L (ref 3.5–5.2)
SODIUM: 140 mmol/L (ref 134–144)

## 2018-10-31 NOTE — Progress Notes (Signed)
Patient ID: Alicia [name], female   DOB: Jul 07, 1956, 62 y.o.   MRN: [id number]  BMP: Na 140, K 4.5, BUN 18, Cr 0.91, eGFR 79  Message left on identified voice mail stating "Everything looked good, keep doing what you are doing".

## 2018-11-03 ENCOUNTER — Telehealth: Payer: Self-pay | Admitting: *Deleted

## 2018-11-03 DIAGNOSIS — I5043 Acute on chronic combined systolic (congestive) and diastolic (congestive) heart failure: Secondary | ICD-10-CM

## 2018-11-03 NOTE — Telephone Encounter (Signed)
Signed. Thanks.

## 2018-11-03 NOTE — Telephone Encounter (Signed)
-----   Message from Zola Button sent at 11/02/2018  3:20 PM EST ----- Regarding: RE: [place] Per patient's  [place].  A DME order can be written to discontinue 02  And and O&O Overnight oximetry to confirm no night time oxygen is needed.   Chilon ----- Message ----- From: Marcelino Duster, CMA Sent: 11/02/2018  10:28 AM EST To: Zola Button, Imp Front Desk Pool Subject: FW: Discontinue Home O2                        Chilon,  Can you d/c pt's home O2 (as requested by DrKlima).   Thank you.  ----- Message ----- From: Oval Linsey, MD Sent: 10/28/2018   5:10 PM EST To: Marcelino Duster, CMA Subject: Discontinue Home O2                            Dear Ulis Rias,  Is there anything I need to do to facilitate the discontinuance of the home oxygen for Ms. Wartman as she no longer qualifies for it?  Thanks,   Fritz Pickerel

## 2018-11-07 ENCOUNTER — Telehealth: Payer: Self-pay

## 2018-11-07 NOTE — Telephone Encounter (Signed)
Patient meets inclusion criteria for current pharmacy residency project to initiate SGLT2i or GLP1-RA therapy for cardiovascular benefit due to current diagnosis of heart failure and DM.  Patient has scheduled appointment with Dr. Marlou Porch on 11/08/18.  Pharmacist will discuss initiation of Jardiance and enrollment into study with patient and provider at upcoming office visit.   Relevant labs and dates: Lab Results  Component Value Date   HGBA1C 9.9 (A) 09/16/2018   Lab Results  Component Value Date   CREATININE 0.91 10/28/2018   CREATININE 0.78 10/19/2018   CREATININE 0.68 10/18/2018   Estimated Creatinine Clearance: 76.3 mL/min (by C-G formula based on SCr of 0.91 mg/dL). Wt Readings from Last 1 Encounters:  10/28/18 216 lb 6.4 oz (98.2 kg)   BP Readings from Last 1 Encounters:  10/28/18 134/73    Metformin use: Yes  Current antidiabetic regimen: Bydureon, Lantus, Humalog, metformin   Isaias Sakai, [place] D PGY1 Pharmacy Resident  11/07/2018      10:04 PM

## 2018-11-08 ENCOUNTER — Encounter: Payer: Self-pay | Admitting: Cardiology

## 2018-11-08 ENCOUNTER — Ambulatory Visit (INDEPENDENT_AMBULATORY_CARE_PROVIDER_SITE_OTHER): Payer: Medicare Other | Admitting: Cardiology

## 2018-11-08 VITALS — BP 140/80 | HR 74 | Ht 65.0 in | Wt 220.0 lb

## 2018-11-08 DIAGNOSIS — I5022 Chronic systolic (congestive) heart failure: Secondary | ICD-10-CM

## 2018-11-08 DIAGNOSIS — I495 Sick sinus syndrome: Secondary | ICD-10-CM

## 2018-11-08 DIAGNOSIS — I10 Essential (primary) hypertension: Secondary | ICD-10-CM

## 2018-11-08 DIAGNOSIS — E119 Type 2 diabetes mellitus without complications: Secondary | ICD-10-CM

## 2018-11-08 DIAGNOSIS — I428 Other cardiomyopathies: Secondary | ICD-10-CM | POA: Diagnosis not present

## 2018-11-08 MED ORDER — SACUBITRIL-VALSARTAN 97-103 MG PO TABS: 1.0000 | ORAL_TABLET | Freq: Two times a day (BID) | ORAL | 3 refills | Status: DC
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
## 2018-11-08 NOTE — Telephone Encounter (Signed)
No PharmD in clinic this AM to address due to staffing shortage, pt unable to be enrolled.

## 2018-11-08 NOTE — Patient Instructions (Addendum)
Medication Instructions:  1) INCREASE ENTRESTO to 97-103 mg twice daily  Labwork: None  Testing/Procedures: None  Follow-Up: Your provider wants you to follow-up in: 6 months with Truitt Merle, NP. You will receive a reminder letter in the mail two months in advance. If you don't receive a letter, please call our office to schedule the follow-up appointment.    Your provider wants you to follow-up in: 1 year with Dr. Marlou Porch. You will receive a reminder letter in the mail two months in advance. If you don't receive a letter, please call our office to schedule the follow-up appointment.

## 2018-11-25 NOTE — Addendum Note (Signed)
Addended by: Orson Gear on: 11/25/2018 04:37 PM   Modules accepted: Orders

## 2018-12-14 ENCOUNTER — Ambulatory Visit (INDEPENDENT_AMBULATORY_CARE_PROVIDER_SITE_OTHER): Payer: Medicare Other | Admitting: Internal Medicine

## 2018-12-14 ENCOUNTER — Other Ambulatory Visit: Payer: Self-pay

## 2018-12-14 DIAGNOSIS — Z872 Personal history of diseases of the skin and subcutaneous tissue: Secondary | ICD-10-CM | POA: Diagnosis not present

## 2018-12-14 DIAGNOSIS — F17201 Nicotine dependence, unspecified, in remission: Secondary | ICD-10-CM

## 2018-12-14 DIAGNOSIS — L02211 Cutaneous abscess of abdominal wall: Secondary | ICD-10-CM | POA: Diagnosis not present

## 2018-12-14 MED ORDER — DOXYCYCLINE HYCLATE 100 MG PO TABS: 100.0000 mg | ORAL_TABLET | Freq: Two times a day (BID) | ORAL | 0 refills | Status: DC

## 2018-12-14 NOTE — Patient Instructions (Addendum)
Thank you for allowing [name] taking care of you at internal medicine center.  You came in due to painful swelling and bump on your abdomen. There is an abscess in your abdominal wall,  however not at the stage that we can open and draine it.   I prescribed you antibiotic (Doxyxycline 100 mg, every 12 hours), please pick it up from the pharmacy and take it as instructed. As we discussed, if no improvement or if worsening in 2 days, please come back to clinic for reevaluation and probable incision and draining.  I did not make any changes on rest of your medications today.   Should you have any questions or concerns please call the internal medicine clinic at [phone].   Thank you

## 2018-12-14 NOTE — Assessment & Plan Note (Addendum)
Alicia [name] presented with abscess at right side of her abdomen. It started 3 days ago and got worse yesterday. Is tender to palpation. Does not report any draignage from the abscess. She denies fever or chills.  She has Hx of recurrent abscess (on her abdomen and right leg) that was treated before with I&D and antibiotic.  On exam, there is a 3cm x 3cm abscess with peripheral erythema, is tender to palpation. No active drainage. It is hard on palpation with thick skin, No fluctuation on the abscess, so we do not perform I&D today. Will treat with antibiotic and will reevaluate in 2 days if no improvement or if worsens.  -Doxycycline 100 mg BID x 7 days -Follow up in clinic in 2 days if no improvement or if worsens, to evaluate for I &D

## 2018-12-14 NOTE — Progress Notes (Signed)
CC: Skin abscess  HPI:  Alicia [name] is a 63 y.o. female with past medical history listed below, presented with 4 days history of abscess on right side of her abdominal wall.  Please refer to problem based charting for further details and assessment and plan.  Past Medical History:  Diagnosis Date  . Abscess of skin of abdomen 08/31/2018  . Acquired lactose intolerance 09/24/2017  . 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  Non-functioning  . Aortic atherosclerosis ([place]) 09/24/2017   Asymptomatic, found on CT scan  . Blood transfusion without reported diagnosis   . Chronic Systolic Heart Failure 3/47/4259   Felt to be non-ischemic and secondary to hypertension.  Echo (05/28/2014): LVEF 25%.  Is not interested in AICD placement.  Marland Kitchen COPD exacerbation ([place])   . Coronary artery disease involving native coronary artery of native heart without angina pectoris 11/09/2014   Cardiac cath (02/18/2014): Non-obstructive, mRCA 30%, dRCA 60%  . 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 Diverticulosis of colon 09/24/2017  . Essential hypertension 10/15/2013  . Gastroesophageal reflux disease 09/24/2017  . History of cerebrovascular accident 05/10/2013   Per patient report 6 previous strokes, most recent on 05/10/13.  No residual deficits.  . Hyperlipidemia   . Overweight (BMI 25.0-29.9) 09/24/2017  . Psoriasis 09/24/2017  . Seasonal allergic rhinitis due to pollen 09/24/2017   Spring and early Fall  . Small Bowel Obstruction (SBO) 01/13/2014   Ex-Lap & Lysis of Adhesion, 01/15/2014  . Tobacco use disorder 01/15/2014  . Type 2 diabetes mellitus with moderate nonproliferative diabetic retinopathy ([place]) 10/15/2013   Family history: HTN       Mother DM 2      Mother CVA        Mother  Social history: Former smoker, quit 3 months ago Denies EtOH use Denies illicit drug use  Review of  Systems:  Review of Systems  Constitutional: Negative for chills and fever.  Respiratory: Negative for shortness of breath.   Cardiovascular: Negative for chest pain.  Skin:       Recurrent skin abscess.    Physical Exam:  Vitals:   12/14/18 0839  BP: (!) 147/71  Pulse: 71  Temp: 98.4 F (36.9 C)  TempSrc: Oral  SpO2: (!) 89%  Weight: 216 lb 8 oz (98.2 kg)  Height: 5\' 5"  (1.651 m)   Physical Exam Vitals signs reviewed.  Constitutional:      General: She is not in acute distress.    Appearance: Normal appearance. She is not ill-appearing or toxic-appearing.  Cardiovascular:     Rate and Rhythm: Normal rate and regular rhythm.     Pulses: Normal pulses.     Heart sounds: Normal heart sounds. No murmur.  Pulmonary:     Effort: Pulmonary effort is normal.     Breath sounds: Normal breath sounds. No wheezing or rales.  Abdominal:     General: Bowel sounds are normal.     Palpations: Abdomen is soft.     Tenderness: There is abdominal tenderness. There is no guarding.     Comments: 3 cm x 3 cm abscess, at right lower quadrant of abdominal wall.  With peripheral erythema.  Tender to palpation.  It is hard in touch (with thick skin with no fluctuation  Musculoskeletal:     Right lower leg: No edema.     Left  lower leg: No edema.  Skin:    General: Skin is warm and dry.  Neurological:     Mental Status: She is alert and oriented to person, place, and time. Mental status is at baseline.  Psychiatric:        Mood and Affect: Mood normal.        Behavior: Behavior normal.      Assessment & Plan:   See Encounters Tab for problem based charting.  Patient seen with Dr. Dareen Piano

## 2018-12-14 NOTE — Progress Notes (Signed)
Internal Medicine Clinic Attending  I saw and evaluated the patient.  I personally confirmed the key portions of the history and exam documented by Dr. Myrtie Hawk and I reviewed pertinent patient test results.  The assessment, diagnosis, and plan were formulated together and I agree with the documentation in the resident's note.   Patient noted to have a 3 x 3 cm area of induration over her right lower abdominal wall.  There was no fluctuation on exam but the area was tender to palpation with surrounding erythema.  We will start the patient on doxycycline today.  I do not believe that an I&D is currently necessary given the exam findings. Patient instructed to follow-up in 2 days if there is no improvement for possible I&D.

## 2018-12-19 DIAGNOSIS — J9611 Chronic respiratory failure with hypoxia: Secondary | ICD-10-CM | POA: Diagnosis not present

## 2018-12-20 ENCOUNTER — Telehealth: Payer: Self-pay | Admitting: *Deleted

## 2018-12-20 NOTE — Telephone Encounter (Signed)
Pt calls and states dr Eppie Gibson called her this am and she was away from phone, she states she now has phone beside her and will await his call

## 2018-12-21 NOTE — Telephone Encounter (Signed)
I was able to reach her this afternoon.  She denies daytime somnolence or falling asleep inappropriately.  She also notes no morning headaches.  She does not know if she stops breathing at night and does not know if she snores.  She does not have symptoms consistent with sleep apnea, but I am concerned about the nocturnal desaturations.  I have asked that she wear O2 at night.  She is scheduled to see me in just over 2 weeks and we will discuss this further at that time.

## 2018-12-21 NOTE — Telephone Encounter (Signed)
Returned call today to both mobile phone and home phone and reached voice mail only.  Left a message I would try to call this afternoon if I had time while in clinic.

## 2018-12-30 ENCOUNTER — Ambulatory Visit (INDEPENDENT_AMBULATORY_CARE_PROVIDER_SITE_OTHER): Payer: Medicare Other | Admitting: Internal Medicine

## 2018-12-30 ENCOUNTER — Encounter: Payer: Self-pay | Admitting: Internal Medicine

## 2018-12-30 VITALS — BP 134/69 | HR 76 | Temp 98.1°F | Wt 219.0 lb

## 2018-12-30 DIAGNOSIS — E669 Obesity, unspecified: Secondary | ICD-10-CM

## 2018-12-30 DIAGNOSIS — I2510 Atherosclerotic heart disease of native coronary artery without angina pectoris: Secondary | ICD-10-CM | POA: Diagnosis not present

## 2018-12-30 DIAGNOSIS — F17210 Nicotine dependence, cigarettes, uncomplicated: Secondary | ICD-10-CM

## 2018-12-30 DIAGNOSIS — I10 Essential (primary) hypertension: Secondary | ICD-10-CM | POA: Diagnosis not present

## 2018-12-30 DIAGNOSIS — I5042 Chronic combined systolic (congestive) and diastolic (congestive) heart failure: Secondary | ICD-10-CM | POA: Diagnosis not present

## 2018-12-30 DIAGNOSIS — G4734 Idiopathic sleep related nonobstructive alveolar hypoventilation: Secondary | ICD-10-CM | POA: Diagnosis not present

## 2018-12-30 DIAGNOSIS — F17200 Nicotine dependence, unspecified, uncomplicated: Secondary | ICD-10-CM

## 2018-12-30 DIAGNOSIS — E113393 Type 2 diabetes mellitus with moderate nonproliferative diabetic retinopathy without macular edema, bilateral: Secondary | ICD-10-CM

## 2018-12-30 DIAGNOSIS — L02211 Cutaneous abscess of abdominal wall: Secondary | ICD-10-CM

## 2018-12-30 DIAGNOSIS — Z6836 Body mass index (BMI) 36.0-36.9, adult: Secondary | ICD-10-CM

## 2018-12-30 DIAGNOSIS — E7800 Pure hypercholesterolemia, unspecified: Secondary | ICD-10-CM

## 2018-12-30 LAB — POCT GLYCOSYLATED HEMOGLOBIN (HGB A1C): Hemoglobin A1C: 10.1 % — AB (ref 4.0–5.6)

## 2018-12-30 LAB — GLUCOSE, CAPILLARY: GLUCOSE-CAPILLARY: 133 mg/dL — AB (ref 70–99)

## 2018-12-30 MED ORDER — FUROSEMIDE 40 MG PO TABS: 40.0000 mg | ORAL_TABLET | Freq: Two times a day (BID) | ORAL | 3 refills | Status: DC

## 2018-12-30 NOTE — Progress Notes (Signed)
   Subjective:    Patient ID: Alicia [name], female    DOB: 09/17/1956, 63 y.o.   MRN: [id number]  HPI  Alicia [name] is here for follow-up of her diabetes complicated by moderate non-proliferative diabetic retinopathy, essential hypertension, tobacco abuse disorder, chronic combined systolic and diastolic heart failure, coronary artery disease, hyperlipidemia, obesity, nocturnal hypoxemia, and abscess of the abdominal wall. Please see the A&P for the status of the pt's chronic medical problems.  She is without any acute complaints today.  Review of Systems  Constitutional: Negative for activity change, appetite change and unexpected weight change.  Respiratory: Negative for chest tightness and shortness of breath.   Cardiovascular: Negative for chest pain and leg swelling.  Gastrointestinal: Positive for constipation. Negative for abdominal pain, diarrhea, nausea and vomiting.  Genitourinary: Negative for difficulty urinating.  Neurological: Negative for headaches.  Psychiatric/Behavioral: Negative for sleep disturbance.      Objective:   Physical Exam Vitals signs and nursing note reviewed.  Constitutional:      General: She is not in acute distress.    Appearance: Normal appearance. She is obese. She is not ill-appearing, toxic-appearing or diaphoretic.  HENT:     Head: Normocephalic and atraumatic.  Eyes:     General: No scleral icterus.       Right eye: No discharge.        Left eye: No discharge.  Cardiovascular:     Rate and Rhythm: Normal rate and regular rhythm.     Heart sounds: Normal heart sounds. No murmur. No friction rub. No gallop.   Pulmonary:     Effort: Pulmonary effort is normal. No respiratory distress.     Breath sounds: Normal breath sounds. No stridor. No wheezing, rhonchi or rales.  Musculoskeletal:        General: No swelling or deformity.     Right lower leg: No edema.     Left lower leg: No edema.  Skin:    General: Skin is warm and dry.    Neurological:     Mental Status: She is alert and oriented to person, place, and time. Mental status is at baseline.     Coordination: Coordination normal.     Gait: Gait normal.  Psychiatric:        Mood and Affect: Mood normal.        Behavior: Behavior normal.        Thought Content: Thought content normal.        Judgment: Judgment normal.       Assessment & Plan:   Please see the problem-oriented charting.

## 2018-12-30 NOTE — Assessment & Plan Note (Signed)
Assessment  Her hemoglobin A1c today was 10.1 up from 9.9 three months ago.  She states that her blood sugars are sometimes high at night and she correlates this with eating later than she should.  She denies any episodes of diaphoresis or low blood sugars at night.  On occasion she will feel like her blood sugar is low, but during those times she takes it and it is in the 100s.  Her current regimen includes Bydureon 2 mg weekly, Lantus 50 units daily, NovoLog 18 units before breakfast, 18 units before lunch, and 20 units before dinner.  She is also on Metformin XR but is taking it at 500 mg twice daily rather than the prescribed 1000 mg twice daily.  She admits that her diet remains less than ideal although she is gotten away from the sugary drinks.  She is due for a follow-up retinal examination in March.  Plan  For Christmas she got an air Rolly Salter as well as a Radiographer, therapeutic and plans on using these more frequently.  We discussed the importance of eating more vegetables that are non-starchy and trying to limit her bread.  For instance, we discussed the fact that if her family brings her a sandwich that she should try eating the center and bypassing the bread.  She is committed to continuing to work on improving her diet and portion size.  We will continue the Bydureon at 2 mg weekly, Lantus at 50 units daily, and NovoLog at 18 units in the morning and 18 units before lunch.  We will increase the before dinner dose to 24 units.  We will also increase the metformin back to 1000 mg twice daily.  In the future we will consider splitting the Lantus dose to twice daily, especially if we need to escalate it moving forward.  She will call Dr. Zenia Resides office in March to schedule her follow-up eye examination.  At the follow-up visit we will repeat the hemoglobin A1c to see if we have made any progress on improving her glycemic control.

## 2018-12-30 NOTE — Assessment & Plan Note (Signed)
Assessment  She is having no signs or symptoms of dyspnea on exertion, orthopnea, or paroxysmal nocturnal dyspnea on her current heart failure regimen which includes Entresto 97-103 mg 1 tablet twice daily and Lasix 40 mg by mouth twice daily.  She is not a candidate for beta-blocker therapy secondary to her sinoatrial node disease.  Plan  We will continue the Entresto at 97-103 mg 1 tablet twice daily and Lasix 40 mg by mouth twice daily.  We will reassess the efficacy of this therapy in maintaining her symptom-free state at the follow-up visit.

## 2018-12-30 NOTE — Assessment & Plan Note (Signed)
Assessment  A recent overnight oximetry revealed desaturations at night.  She therefore is continuing on home oxygen at night.  I tried to assess for evidence of obstructive sleep apnea.  She denies ever being told she snores or stops breathing at night.  She denies getting up to urinate or waking up with headaches.  She also denies daytime somnolence or falling asleep at inappropriate times.  She did admit several months ago falling asleep once while waiting to pick up her grandchildren at school.  She has not had any further episodes of inappropriately falling asleep since.  We had a long discussion about my concerns for possible sleep apnea with regards to both her diabetes control and hypertensive control and I suggested that we could further evaluate for this possibility with an overnight sleep study.  After discussion, she did not feel this was necessary at this time.  Plan  We will continue to periodically assess for possible explanations for her nocturnal hypoxemia, in particular for signs or symptoms that would suggest obstructive sleep apnea.  She will continue with the nocturnal oxygen supplementation.

## 2018-12-30 NOTE — Assessment & Plan Note (Signed)
She received the pneumococcal 13 Prevnar shot in December although it does not appear to be picking up in the health maintenance tab.  We will investigate why this is the case and update her health maintenance.

## 2018-12-30 NOTE — Assessment & Plan Note (Signed)
Assessment  Over the last 3 months she has gained 3 pounds.  Again we had a very long discussion about foods to avoid such as starches/breads.  She was praised on her ability to avoid sugary drinks.  We are now working on increasing her vegetable intake and decreasing her portion control.  She also states she has been much more active.  Plan  She was encouraged to use her air Rolly Salter as well as Radiographer, therapeutic as she plans.  She will try to decrease her portion sizes and decrease the starch/bread intake.  She will also try to maintain more activity in her daily routine.  We will reassess the efficacy of these lifestyle modifications, helping her achieve a more healthy weight, and better glycemic and hypertensive control, at the follow-up visit.

## 2018-12-30 NOTE — Patient Instructions (Addendum)
It was good to see you again.  I really enjoy seeing you.  1) Increase the metformin XR to 1000 mg (2 tablets) twice a day.  2) Increase novolog insulin the dinner dose to 24 units.  3) Keep other medications the same as you have been taking them.  4) In March call for an eye appointment.  I will see you in 3 months, sooner if necessary.

## 2018-12-30 NOTE — Assessment & Plan Note (Signed)
Assessment  She recently had an I&D of a superficial skin abscess on her right flank at about the level of the umbilicus.  She has had no fevers, shakes, chills, recurrence of the abscess, erythema, or warmth at this location.  Plan  We are continuing to work on glycemic control in hopes of decreasing the frequency of skin abscesses that she has experienced.  No further evaluation or intervention is required at this time as her lesion is healing well.

## 2018-12-30 NOTE — Assessment & Plan Note (Signed)
Assessment  She has been able to maintain herself off of any tobacco products.  Plan  She was again praised for her success in this area as it is the most important thing she could do with regards to her health.  We will follow-up on her success at continuing off of any tobacco products at the follow-up visit.

## 2018-12-30 NOTE — Assessment & Plan Note (Signed)
Assessment  She is tolerating the atorvastatin 40 mg by mouth daily without myalgias.  Plan  We will continue with his high intensity statin and reassess for intolerances at the follow-up visit. 

## 2018-12-30 NOTE — Assessment & Plan Note (Signed)
Assessment  She denies any signs or symptoms consistent with angina.  Her current regimen includes amlodipine 10 mg by mouth daily, aspirin 81 mg by mouth daily, and atorvastatin 40 mg by mouth daily.  Plan  We will continue the amlodipine, aspirin, and atorvastatin at the current doses and reassess for evidence of angina at the follow-up visit.  We are also encouraging her to continue off any tobacco products, aggressively manage her hypertension, and try to improve her glycemic control.

## 2018-12-30 NOTE — Assessment & Plan Note (Signed)
Assessment  Her blood pressure today was near target at 134/69.  This is on amlodipine 10 mg by mouth daily and Entresto 97-103 mg 1 tablet twice daily.  She is also on Lasix 40 mg by mouth twice daily.  Plan  We will continue the amlodipine at 10 mg by mouth daily, Entresto at 97-103 mg 1 tablet twice daily, and Lasix 40 mg by mouth twice daily.  She was reminded that she could take the second dose of the Lasix 6 hours after her first dose so that she was not up late urinating.

## 2019-01-16 ENCOUNTER — Other Ambulatory Visit: Payer: Self-pay | Admitting: Internal Medicine

## 2019-01-16 DIAGNOSIS — Z1231 Encounter for screening mammogram for malignant neoplasm of breast: Secondary | ICD-10-CM

## 2019-01-19 DIAGNOSIS — J9611 Chronic respiratory failure with hypoxia: Secondary | ICD-10-CM | POA: Diagnosis not present

## 2019-01-31 IMAGING — MG DIGITAL SCREENING BILATERAL MAMMOGRAM WITH TOMO AND CAD
6 of 10 series · 6 of 30 positions shown · non-contrast
Comparison: Previous exam(s).

ACR Breast Density Category a: The breast tissue is almost entirely
fatty.

CLINICAL DATA: Screening.

EXAM:
DIGITAL SCREENING BILATERAL MAMMOGRAM WITH TOMO AND CAD

[L CC synth-2D]
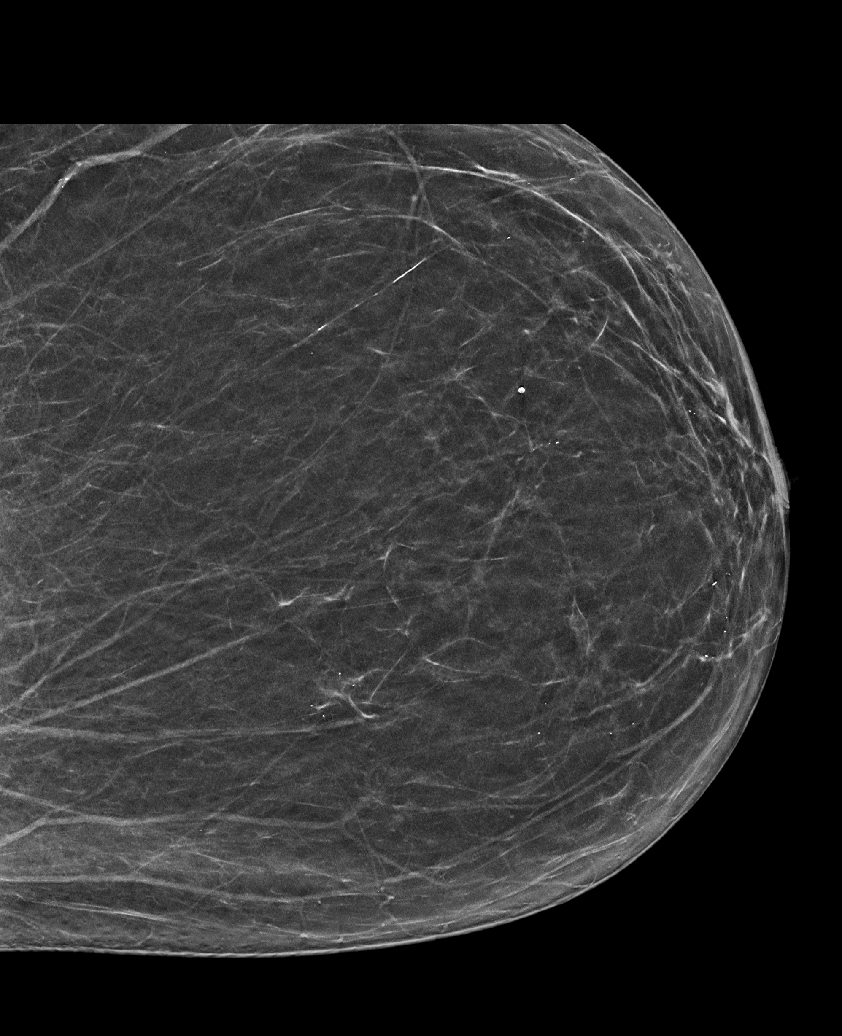

[L MLO synth-2D]
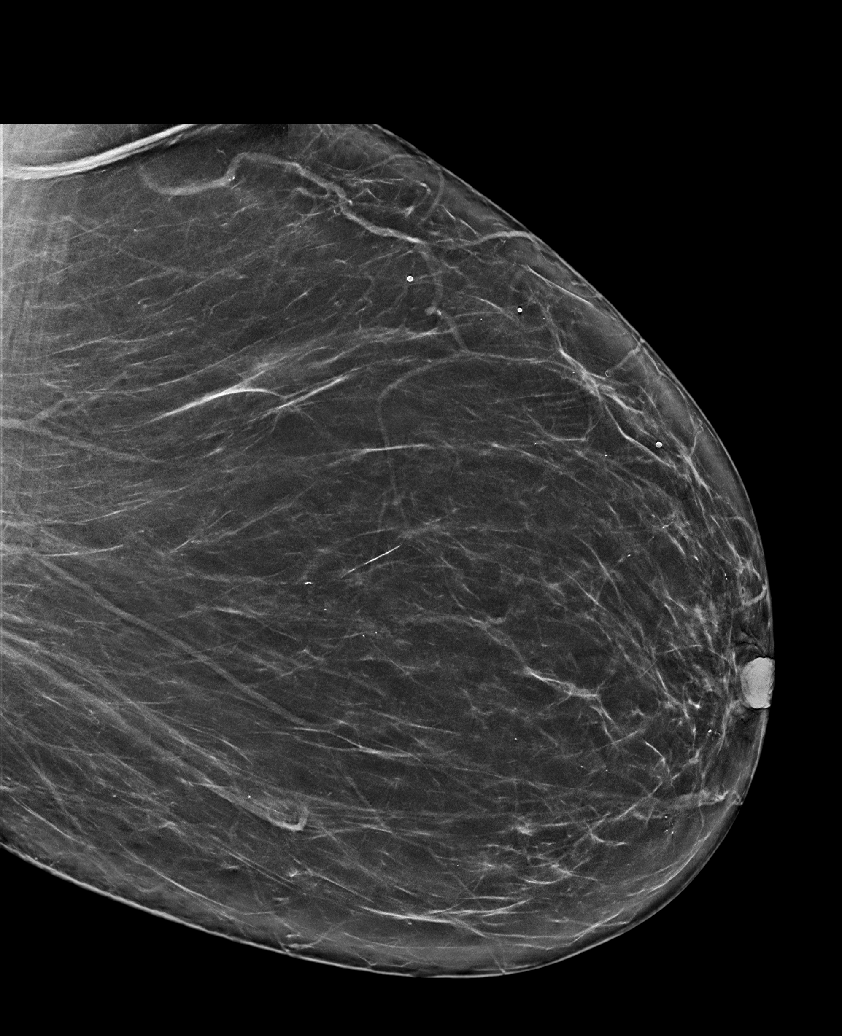

[R CC synth-2D]
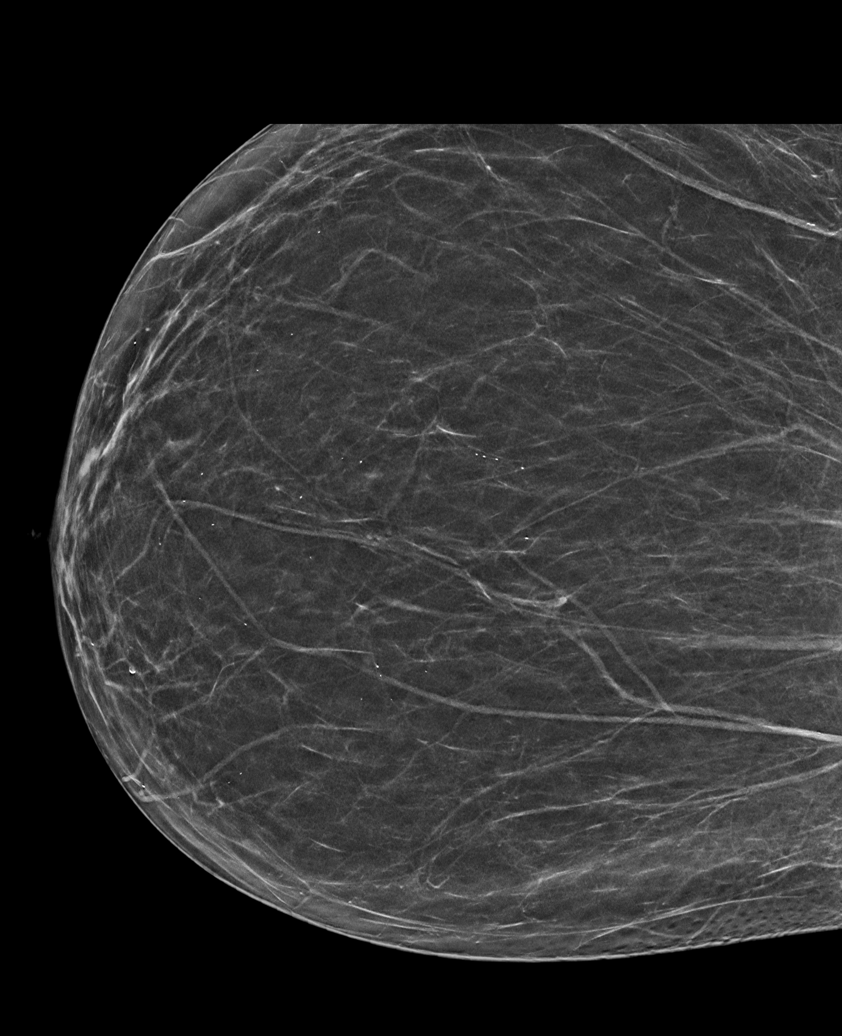

[R MLO synth-2D (1 of 2)]
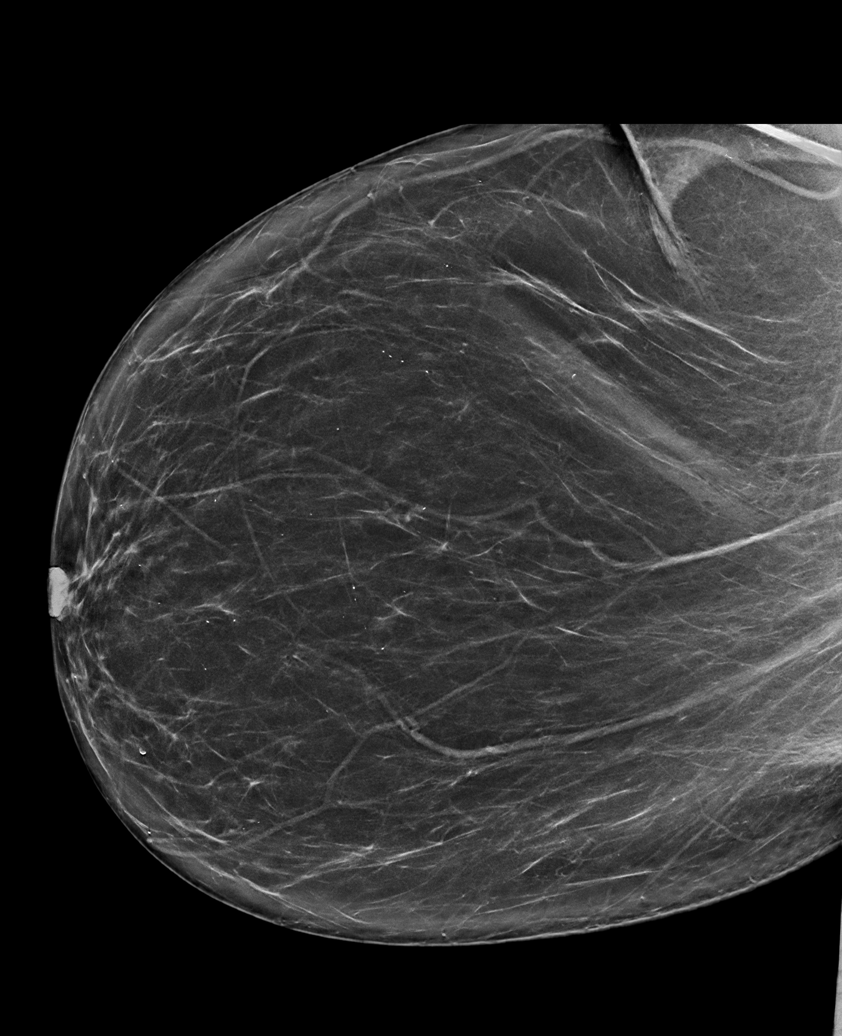

[R MLO synth-2D (2 of 2)]
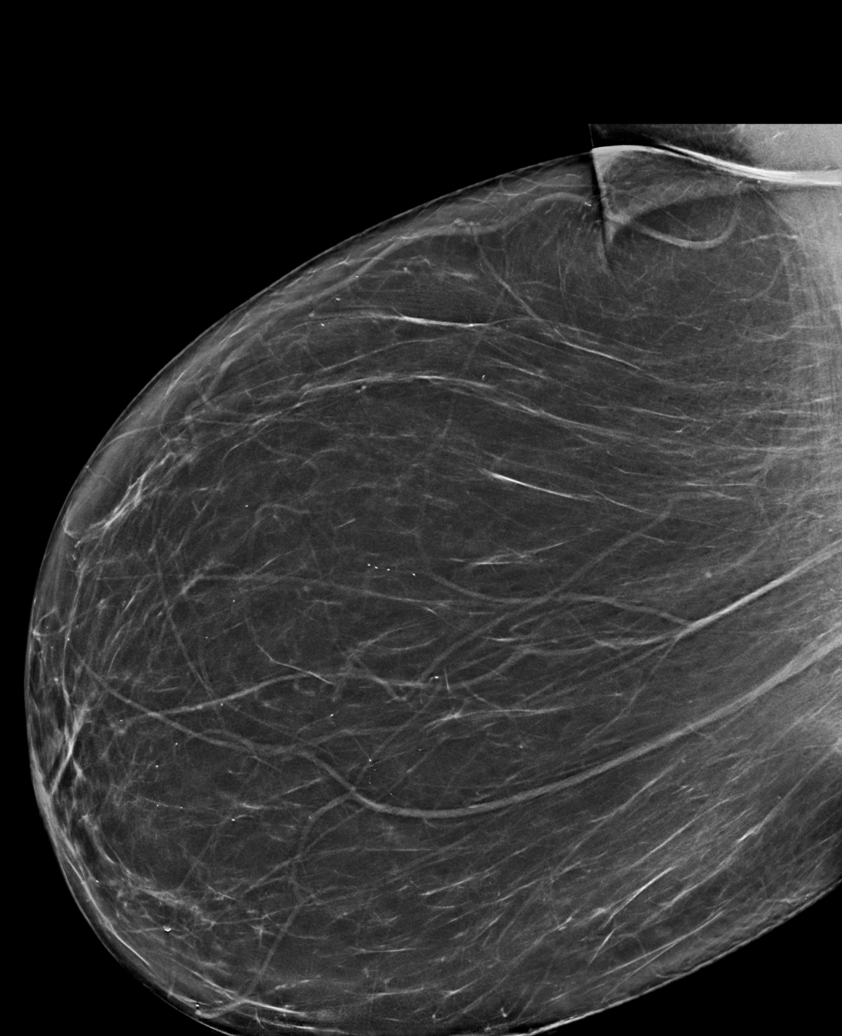

[R MLO tomo · tomo slice 40/79.0]
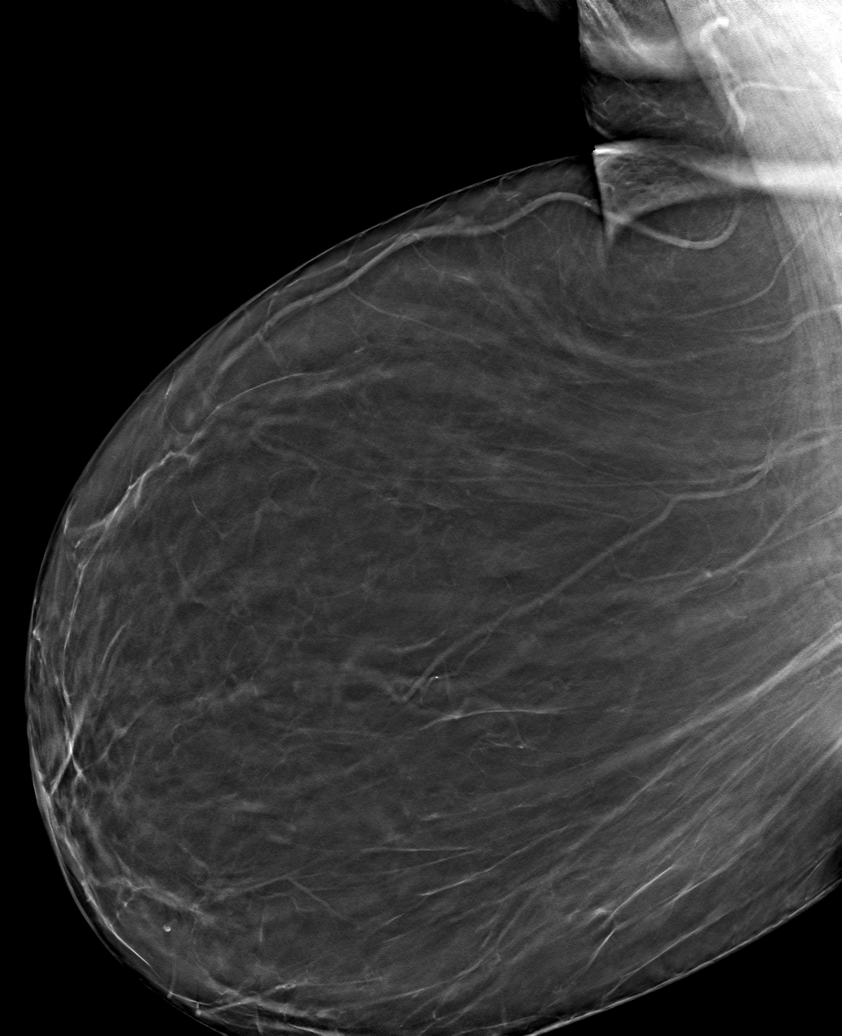

[6 of 30 positions shown; findings below may reference images not displayed]

FINDINGS: There are no findings suspicious for malignancy. Images were
processed with CAD.
IMPRESSION: No mammographic evidence of malignancy. A result letter of this
screening mammogram will be mailed directly to the patient.

RECOMMENDATION:
Screening mammogram in one year. (Code:[id number])

BI-RADS CATEGORY  1: Negative.

## 2019-02-17 DIAGNOSIS — J9611 Chronic respiratory failure with hypoxia: Secondary | ICD-10-CM | POA: Diagnosis not present

## 2019-02-19 ENCOUNTER — Encounter: Payer: Self-pay | Admitting: *Deleted

## 2019-02-20 ENCOUNTER — Inpatient Hospital Stay: Admission: RE | Admit: 2019-02-20 | Payer: Medicare Other | Source: Ambulatory Visit

## 2019-03-20 DIAGNOSIS — J9611 Chronic respiratory failure with hypoxia: Secondary | ICD-10-CM | POA: Diagnosis not present

## 2019-03-23 ENCOUNTER — Ambulatory Visit: Payer: Medicare Other

## 2019-03-31 ENCOUNTER — Ambulatory Visit: Payer: Medicare Other | Admitting: Internal Medicine

## 2019-04-19 DIAGNOSIS — J9611 Chronic respiratory failure with hypoxia: Secondary | ICD-10-CM | POA: Diagnosis not present

## 2019-04-26 ENCOUNTER — Telehealth: Payer: Self-pay

## 2019-04-26 DIAGNOSIS — E113393 Type 2 diabetes mellitus with moderate nonproliferative diabetic retinopathy without macular edema, bilateral: Secondary | ICD-10-CM

## 2019-04-26 MED ORDER — DULAGLUTIDE 1.5 MG/0.5ML ~~LOC~~ SOAJ: 1.5000 mg | SUBCUTANEOUS | 11 refills | Status: DC

## 2019-04-26 NOTE — Telephone Encounter (Signed)
Called patient to discuss switch from Bydureon to Trulicity. Patient is ok with switch and will pick up Trulicity today. Educated patient on how to use Trulicity over the phone and let her know to call if she has any questions. She says her granddaughter will help her listen for 2 clicks so she knows she has received the full dose.   Patient requests we let her heart doctor know of the switch from Bydureon to [place].

## 2019-04-27 NOTE — Telephone Encounter (Signed)
Thank you.  Sounds great.  Appreciate the update. Candee Furbish, MD

## 2019-04-28 ENCOUNTER — Ambulatory Visit
Admission: RE | Admit: 2019-04-28 | Discharge: 2019-04-28 | Disposition: A | Discharge status: Home / Self Care | Payer: Medicare Other | Source: Ambulatory Visit | Attending: Internal Medicine | Admitting: Internal Medicine

## 2019-04-28 ENCOUNTER — Other Ambulatory Visit: Payer: Self-pay

## 2019-04-28 DIAGNOSIS — Z1231 Encounter for screening mammogram for malignant neoplasm of breast: Secondary | ICD-10-CM | POA: Diagnosis not present

## 2019-05-01 ENCOUNTER — Telehealth: Payer: Self-pay | Admitting: *Deleted

## 2019-05-01 NOTE — Telephone Encounter (Signed)
lvm with instructions for telehealth visit on 6/15 @ 10:15.  Pt is instructed to R/S if pt cannot keep appt. Consent will need to be given day of visit.

## 2019-05-04 ENCOUNTER — Encounter: Payer: Self-pay | Admitting: *Deleted
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
## 2019-05-08 ENCOUNTER — Telehealth (INDEPENDENT_AMBULATORY_CARE_PROVIDER_SITE_OTHER): Payer: Medicare Other | Admitting: Nurse Practitioner

## 2019-05-08 ENCOUNTER — Other Ambulatory Visit: Payer: Self-pay

## 2019-05-08 ENCOUNTER — Encounter: Payer: Self-pay | Admitting: Nurse Practitioner

## 2019-05-08 DIAGNOSIS — R0602 Shortness of breath: Secondary | ICD-10-CM | POA: Diagnosis not present

## 2019-05-08 DIAGNOSIS — I5022 Chronic systolic (congestive) heart failure: Secondary | ICD-10-CM | POA: Diagnosis not present

## 2019-05-08 DIAGNOSIS — I10 Essential (primary) hypertension: Secondary | ICD-10-CM

## 2019-05-08 DIAGNOSIS — Z79899 Other long term (current) drug therapy: Secondary | ICD-10-CM

## 2019-05-08 DIAGNOSIS — I110 Hypertensive heart disease with heart failure: Secondary | ICD-10-CM

## 2019-05-08 DIAGNOSIS — Z7189 Other specified counseling: Secondary | ICD-10-CM

## 2019-05-08 DIAGNOSIS — I428 Other cardiomyopathies: Secondary | ICD-10-CM | POA: Diagnosis not present

## 2019-05-08 NOTE — Telephone Encounter (Signed)
Virtual Visit Pre-Appointment Phone Call  "(Name), I am calling you today to discuss your upcoming appointment. We are currently trying to limit exposure to the virus that causes COVID-19 by seeing patients at home rather than in the office."  1. "What is the BEST phone number to call the day of the visit?" - include this in appointment notes  2. "Do you have or have access to (through a family member/friend) a smartphone with video capability that we can use for your visit?" a. If yes - list this number in appt notes as "cell" (if different from BEST phone #) and list the appointment type as a VIDEO visit in appointment notes b. If no - list the appointment type as a PHONE visit in appointment notes  3. Confirm consent - "In the setting of the current Covid19 crisis, you are scheduled for a (phone or video) visit with your provider on (Monday, June 15) at (10:15 am ).  Just as we do with many in-office visits, in order for you to participate in this visit, we must obtain consent.  If you'd like, I can send this to your mychart (if signed up) or email for you to review.  Otherwise, I can obtain your verbal consent now.  All virtual visits are billed to your insurance company just like a normal visit would be.  By agreeing to a virtual visit, we'd like you to understand that the technology does not allow for your provider to perform an examination, and thus may limit your provider's ability to fully assess your condition. If your provider identifies any concerns that need to be evaluated in person, we will make arrangements to do so.  Finally, though the technology is pretty good, we cannot assure that it will always work on either your or our end, and in the setting of a video visit, we may have to convert it to a phone-only visit.  In either situation, we cannot ensure that we have a secure connection.  Are you willing to proceed?" STAFF: Did the patient verbally acknowledge consent to telehealth  visit? Document YES/NO here: YES.   4. Advise patient to be prepared - "Two hours prior to your appointment, go ahead and check your blood pressure, pulse, oxygen saturation, and your weight (if you have the equipment to check those) and write them all down. When your visit starts, your provider will ask you for this information. If you have an Apple Watch or Kardia device, please plan to have heart rate information ready on the day of your appointment. Please have a pen and paper handy nearby the day of the visit as well."  5. Give patient instructions for MyChart download to smartphone OR Doximity/Doxy.me as below if video visit (depending on what platform provider is using)  6. Inform patient they will receive a phone call 15 minutes prior to their appointment time (may be from unknown caller ID) so they should be prepared to answer    TELEPHONE CALL NOTE  Alicia [name] has been deemed a candidate for a follow-up tele-health visit to limit community exposure during the Covid-19 pandemic. I spoke with the patient via phone to ensure availability of phone/video source, confirm preferred email & phone number, and discuss instructions and expectations.  I reminded Alicia [name] to be prepared with any vital sign and/or heart rhythm information that could potentially be obtained via home monitoring, at the time of her visit. I reminded Alicia [name] to expect  a phone call prior to her visit.  Sharif Rendell Avanell Shackleton 05/08/2019 10:18 AM   INSTRUCTIONS FOR DOWNLOADING THE MYCHART APP TO SMARTPHONE  - The patient must first make sure to have activated MyChart and know their login information - If Apple, go to [employer] and type in MyChart in the search bar and download the app. If Android, ask patient to go to [employer] and type in [place] in the search bar and download the app. The app is free but as with any other app downloads, their phone may require them to verify saved payment  information or Apple/Android password.  - The patient will need to then log into the app with their MyChart username and password, and select [facility] as their healthcare provider to link the account. When it is time for your visit, go to the MyChart app, find appointments, and click Begin Video Visit. Be sure to Select Allow for your device to access the Microphone and Camera for your visit. You will then be connected, and your provider will be with you shortly.  **If they have any issues connecting, or need assistance please contact MyChart service desk [phone])**  **If using a computer, in order to ensure the best quality for their visit they will need to use either of the following Internet Browsers: [employer], or Google Chrome**  IF USING DOXIMITY or DOXY.ME - The patient will receive a link just prior to their visit by text.     FULL LENGTH CONSENT FOR TELE-HEALTH VISIT   I hereby voluntarily request, consent and authorize [place] and its employed or contracted physicians, physician assistants, nurse practitioners or other licensed health care professionals (the Practitioner), to provide me with telemedicine health care services (the "Services") as deemed necessary by the treating Practitioner. I acknowledge and consent to receive the Services by the Practitioner via telemedicine. I understand that the telemedicine visit will involve communicating with the Practitioner through live audiovisual communication technology and the disclosure of certain medical information by electronic transmission. I acknowledge that I have been given the opportunity to request an in-person assessment or other available alternative prior to the telemedicine visit and am voluntarily participating in the telemedicine visit.  I understand that I have the right to withhold or withdraw my consent to the use of telemedicine in the course of my care at any time, without affecting my right  to future care or treatment, and that the Practitioner or I may terminate the telemedicine visit at any time. I understand that I have the right to inspect all information obtained and/or recorded in the course of the telemedicine visit and may receive copies of available information for a reasonable fee.  I understand that some of the potential risks of receiving the Services via telemedicine include:  Marland Kitchen Delay or interruption in medical evaluation due to technological equipment failure or disruption; . Information transmitted may not be sufficient (e.g. poor resolution of images) to allow for appropriate medical decision making by the Practitioner; and/or  . In rare instances, security protocols could fail, causing a breach of personal health information.  Furthermore, I acknowledge that it is my responsibility to provide information about my medical history, conditions and care that is complete and accurate to the best of my ability. I acknowledge that Practitioner's advice, recommendations, and/or decision may be based on factors not within their control, such as incomplete or inaccurate data provided by me or distortions of diagnostic images or specimens that may  result from electronic transmissions. I understand that the practice of medicine is not an exact science and that Practitioner makes no warranties or guarantees regarding treatment outcomes. I acknowledge that I will receive a copy of this consent concurrently upon execution via email to the email address I last provided but may also request a printed copy by calling the office of [facility].    I understand that my insurance will be billed for this visit.   I have read or had this consent read to me. . I understand the contents of this consent, which adequately explains the benefits and risks of the Services being provided via telemedicine.  . I have been provided ample opportunity to ask questions regarding this consent and the Services  and have had my questions answered to my satisfaction. . I give my informed consent for the services to be provided through the use of telemedicine in my medical care  By participating in this telemedicine visit I agree to the above.

## 2019-05-08 NOTE — Patient Instructions (Addendum)
After Visit Summary:  We will be checking the following labs today - NONE   Medication Instructions:    Continue with your current medicines.    If you need a refill on your cardiac medications before your next appointment, please call your pharmacy.     Testing/Procedures To Be Arranged:  N/A  Follow-Up:   See me in 3 months    At [facility], you and your health needs are our priority.  As part of our continuing mission to provide you with exceptional heart care, we have created designated Provider Care Teams.  These Care Teams include your primary Cardiologist (physician) and Advanced Practice Providers (APPs -  Physician Assistants and Nurse Practitioners) who all work together to provide you with the care you need, when you need it.  Special Instructions:  . Stay safe, stay home, wash your hands for at least 20 seconds and wear a mask when out in public.  . It was good to talk with you today.    Call the [facility] office at [phone] if you have any questions, problems or concerns.       

## 2019-05-15 ENCOUNTER — Encounter: Payer: Medicare Other | Admitting: Student in an Organized Health Care Education/Training Program

## 2019-05-17 ENCOUNTER — Other Ambulatory Visit: Payer: Self-pay | Admitting: *Deleted

## 2019-05-17 DIAGNOSIS — E113393 Type 2 diabetes mellitus with moderate nonproliferative diabetic retinopathy without macular edema, bilateral: Secondary | ICD-10-CM

## 2019-05-18 MED ORDER — LANTUS SOLOSTAR 100 UNIT/ML ~~LOC~~ SOPN: 50.0000 [IU] | PEN_INJECTOR | Freq: Every day | SUBCUTANEOUS | 3 refills | Status: DC

## 2019-05-20 DIAGNOSIS — J9611 Chronic respiratory failure with hypoxia: Secondary | ICD-10-CM | POA: Diagnosis not present

## 2019-05-29 ENCOUNTER — Other Ambulatory Visit: Payer: Self-pay

## 2019-05-29 ENCOUNTER — Ambulatory Visit (INDEPENDENT_AMBULATORY_CARE_PROVIDER_SITE_OTHER): Payer: Medicare Other | Admitting: Student in an Organized Health Care Education/Training Program

## 2019-05-29 ENCOUNTER — Encounter: Payer: Self-pay | Admitting: Student in an Organized Health Care Education/Training Program

## 2019-05-29 VITALS — BP 149/75 | HR 68 | Temp 98.1°F | Ht 65.0 in | Wt 224.1 lb

## 2019-05-29 DIAGNOSIS — I10 Essential (primary) hypertension: Secondary | ICD-10-CM

## 2019-05-29 DIAGNOSIS — Z720 Tobacco use: Secondary | ICD-10-CM | POA: Diagnosis not present

## 2019-05-29 DIAGNOSIS — E113393 Type 2 diabetes mellitus with moderate nonproliferative diabetic retinopathy without macular edema, bilateral: Secondary | ICD-10-CM

## 2019-05-29 DIAGNOSIS — E113399 Type 2 diabetes mellitus with moderate nonproliferative diabetic retinopathy without macular edema, unspecified eye: Secondary | ICD-10-CM

## 2019-05-29 DIAGNOSIS — Z23 Encounter for immunization: Secondary | ICD-10-CM

## 2019-05-29 DIAGNOSIS — L409 Psoriasis, unspecified: Secondary | ICD-10-CM | POA: Diagnosis not present

## 2019-05-29 DIAGNOSIS — Z794 Long term (current) use of insulin: Secondary | ICD-10-CM

## 2019-05-29 DIAGNOSIS — F17200 Nicotine dependence, unspecified, uncomplicated: Secondary | ICD-10-CM

## 2019-05-29 DIAGNOSIS — Z79899 Other long term (current) drug therapy: Secondary | ICD-10-CM

## 2019-05-29 LAB — GLUCOSE, CAPILLARY: Glucose-Capillary: 155 mg/dL — ABNORMAL HIGH (ref 70–99)

## 2019-05-29 LAB — POCT GLYCOSYLATED HEMOGLOBIN (HGB A1C): Hemoglobin A1C: 11 % — AB (ref 4.0–5.6)

## 2019-05-29 MED ORDER — INSULIN LISPRO 100 UNIT/ML ~~LOC~~ SOLN: 20.0000 [IU] | Freq: Three times a day (TID) | SUBCUTANEOUS | 3 refills | Status: DC

## 2019-05-29 MED ORDER — TRIAMCINOLONE ACETONIDE 0.025 % EX CREA: 1.0000 "application " | TOPICAL_CREAM | Freq: Two times a day (BID) | CUTANEOUS | 2 refills | Status: DC

## 2019-05-29 NOTE — Assessment & Plan Note (Signed)
Uncontrolled.  A1c 11%.  Plan is to increase Humalog to 20 units 3 times a day with meals.  Continue with Lantus 50 units daily, metformin 1000 mg twice daily, and Trulicity 1.5 mg weekly.  Follow-up in 3 months for repeat A1c.  Encouraged improved nutrition.  Work on in the future, doing a trial of continuous glucose monitoring.  Splitting up her Lantus dose to twice daily.  Trying a SGLT2 inhibitor.  We will work on this slowly as this has been a longstanding problem.

## 2019-05-29 NOTE — Assessment & Plan Note (Signed)
Blood pressure is elevated today.  Plan is to continue with amlodipine 10 mg and Entresto 200 mg twice daily.  We will continue with Lasix 40 mg twice daily.  Right lower extremity edema.  Advised to bring her medications into her next visit.

## 2019-05-29 NOTE — Patient Instructions (Signed)
It was great meeting you today.  I will be your new primary care physician here at the internal medicine center.  We talked about your diabetes today.  Your A1c is 11%.  My goal for your A1c is down to 8%.  We talked about increasing your mealtime insulin to 20 units 3 times a day with breakfast lunch and dinner.  Please continue all your other medications as you are doing.  We set a goal of increasing exercise, walking several times a week.  I want to see you back in clinic in 3 months.  Please bring your medications with you to your next visit.

## 2019-05-29 NOTE — Assessment & Plan Note (Signed)
Rash on her extensor elbow, knees, and PIP joints are most consistent with a mild-moderate psoriasis.  Plan is to try triamcinolone 0.025% cream for relief of the itching.

## 2019-05-29 NOTE — Assessment & Plan Note (Signed)
Smoking approximately 3-4 cigarettes/day.  I spent 5 minutes counseling her on the importance of tobacco cessation.  Told her that the cigarettes would put her at high risk for future strokes and heart attacks.  She can continue to try to cut back.  Not interested in nicotine replacement or pharmacotherapy today.

## 2019-05-29 NOTE — Progress Notes (Signed)
   Assessment and Plan:  See Encounters tab for problem-based medical decision making.   __________________________________________________________  HPI:   63 year old woman here for follow-up of diabetes and hypertension.  This is my first time meeting Alicia [name], previous patient of Dr. Eppie Gibson.  Reports doing well since she was last taken February.  No recent illnesses, no ED visits, hospitalizations, no falls.  No fevers or chills.  No chest pain or shortness of breath.  Lives in a home in [place] with her adult daughter.  She is independent in all activities of daily living.  She reports good compliance with her medications, but did not bring them into clinic today.  Does not exercise regularly.  Reports overeating on occasion.  She is concerned about unintentional weight gain over the last few months.  Denies depressed mood.  Smoking 3-4 cigarettes/day, says she craves the taste of cigarettes.  Uses supplemental oxygen on occasion when walking around the house, but not on a regular basis.  __________________________________________________________  Problem List: Patient Active Problem List   Diagnosis Date Noted  . Coronary artery disease involving native coronary artery of native heart without angina pectoris 11/09/2014    Priority: High  . Chronic combined systolic and diastolic heart failure ([place]) 05/10/2014    Priority: High  . Tobacco use disorder 01/15/2014    Priority: High  . 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 Essential hypertension 10/15/2013    Priority: High  . Sinoatrial node dysfunction ([facility]) 10/17/2018    Priority: Medium  . Aortic atherosclerosis ([place]) 09/24/2017    Priority: Medium  . Gastroesophageal reflux disease 09/24/2017    Priority: Medium  . Obesity (BMI 30.0-34.9) 09/24/2017    Priority: Medium  . Hyperlipidemia     Priority: Medium  . Nocturnal hypoxemia 12/30/2018   Priority: Low  . Psoriasis 09/24/2017    Priority: Low  . 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 Seasonal allergic rhinitis due to pollen 09/24/2017    Priority: Low  . Healthcare maintenance 09/24/2017    Priority: Low  . 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  No hepatojugular reflux CV: RRR, no murmurs, distant sounds, 1+ pitting edema bilaterally Pulm: Normal effort, CTA throughout, no wheezing Abd: Soft, NT, ND, well-healed midline surgical scar Ext: Warm, normal joints Skin: Thick dark plaques over bilateral elbows, knees, and bilateral MCP joints.  Overlying silver scale.

## 2019-05-29 NOTE — Addendum Note (Signed)
Addended by: Marcelino Duster on: 05/29/2019 10:16 AM   Modules accepted: Orders

## 2019-05-31 ENCOUNTER — Other Ambulatory Visit: Payer: Self-pay

## 2019-05-31 DIAGNOSIS — E113393 Type 2 diabetes mellitus with moderate nonproliferative diabetic retinopathy without macular edema, bilateral: Secondary | ICD-10-CM

## 2019-05-31 MED ORDER — "INSULIN SYRINGE-NEEDLE U-100 31G X 15/64"" 0.3 ML MISC": 5 refills | Status: DC

## 2019-05-31 MED ORDER — INSULIN LISPRO (1 UNIT DIAL) 100 UNIT/ML (KWIKPEN): PEN_INJECTOR | SUBCUTANEOUS | 11 refills | Status: DC

## 2019-05-31 NOTE — Telephone Encounter (Signed)
Requesting to speak with a nurse about insulin. Please call pt back.  

## 2019-05-31 NOTE — Telephone Encounter (Signed)
Needs Humalog kwikpens and not vials. Needs syringes too so she can use the vail she got.

## 2019-06-02 ENCOUNTER — Telehealth: Payer: Self-pay | Admitting: Pharmacist

## 2019-06-02 NOTE — Progress Notes (Signed)
Patient was contacted on behalf of PCP to help with DM medication management. Patient declined help at this time. Advised patient to contact me if medication-related concerns arise and she verbalized understanding.

## 2019-06-02 NOTE — Telephone Encounter (Signed)
Ok. Thank you for reaching out to her. Sorry about the miscommunication, I thought we were on the same page at our last visit. I will re-address with her.

## 2019-06-11 ENCOUNTER — Other Ambulatory Visit: Payer: Self-pay | Admitting: Internal Medicine

## 2019-06-19 DIAGNOSIS — J9611 Chronic respiratory failure with hypoxia: Secondary | ICD-10-CM | POA: Diagnosis not present

## 2019-07-20 DIAGNOSIS — J9611 Chronic respiratory failure with hypoxia: Secondary | ICD-10-CM | POA: Diagnosis not present

## 2019-08-10 ENCOUNTER — Other Ambulatory Visit: Payer: Self-pay | Admitting: Student in an Organized Health Care Education/Training Program

## 2019-08-10 DIAGNOSIS — E113393 Type 2 diabetes mellitus with moderate nonproliferative diabetic retinopathy without macular edema, bilateral: Secondary | ICD-10-CM

## 2019-08-10 NOTE — Progress Notes (Signed)
CARDIOLOGY OFFICE NOTE  Date:  08/15/2019    Alicia [name] Date of Birth: 08-22-1956 Medical Record [id number]  PCP:  Axel Filler, MD  Cardiologist:  Marisa Cyphers    Chief Complaint  Patient presents with  . Follow-up    History of Present Illness: Alicia [name] is a 63 y.o. female who presents today for a follow up visit. Seen for Dr. Marlou Porch.   She has a history of NICM with chronic systolic HF, sinus pauses - no longer on Coreg. Remote cath in 2015 with non obstructive CAD noted - managed medically. Other issues as noted below and include HLD, DM, tobacco abuse and obesity.   Last seen in December by Dr. Marlou Porch and was doing well. We did a telehealth visit back in June - she was continuing to do well.   The patient does not have symptoms concerning for COVID-19 infection (fever, chills, cough, or new shortness of breath).   Comes in today. Here alone. She is doing ok. Asking for refills on her medicines.  Says she is doing great and has no complaint. She knows her weight is "too much" - she is trying to do better. No chest pain. Breathing is ok. No swelling. She is pretty active with housework - lives with her daughter and grandkids. No real exercise otherwise. Tolerating her medicines. No syncope. Not dizzy. She feels like she is doing well and has no complaint.   Past Medical History:  Diagnosis Date  . Abscess of skin of abdomen 08/31/2018  . Acquired lactose intolerance 09/24/2017  . 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  Non-functioning  . Aortic atherosclerosis ([place]) 09/24/2017   Asymptomatic, found on CT scan  . Blood transfusion without reported diagnosis   . Chronic Systolic Heart Failure [id number]   Felt to be non-ischemic and secondary to hypertension.  Echo (05/28/2014): LVEF 25%.  Is not interested in AICD placement.  Marland Kitchen COPD exacerbation ([place])   . Coronary artery disease involving  native coronary artery of native heart without angina pectoris 11/09/2014   Cardiac cath (02/18/2014): Non-obstructive, mRCA 30%, dRCA 60%  . 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 Diverticulosis of colon 09/24/2017  . Diverticulosis of colon 09/24/2017  . Essential hypertension 10/15/2013  . Gastroesophageal reflux disease 09/24/2017  . History of cerebrovascular accident 05/10/2013   Per patient report 6 previous strokes, most recent on 05/10/13.  No residual deficits.  . History of cerebrovascular accident 05/10/2013   Per patient report 6 previous strokes, most recent on 05/10/13.  No residual deficits.  . Hyperlipidemia   . Overweight (BMI 25.0-29.9) 09/24/2017  . Psoriasis 09/24/2017  . Seasonal allergic rhinitis due to pollen 09/24/2017   Spring and early Fall  . Small Bowel Obstruction (SBO) 01/13/2014   Ex-Lap & Lysis of Adhesion, 01/15/2014  . Tobacco use disorder 01/15/2014  . Type 2 diabetes mellitus with moderate nonproliferative diabetic retinopathy ([place]) 10/15/2013    Past Surgical History:  Procedure Laterality Date  . CHOLECYSTECTOMY N/A 08/02/2017   Procedure: LAPAROSCOPIC CHOLECYSTECTOMY;  Surgeon: Erroll Luna, MD;  Location: [facility];  Service: General;  Laterality: N/A;  . LAPAROTOMY N/A 01/15/2014   Procedure: Exploratory Laparotomy & Small Bowel Resection  Surgeon: Imogene Burn. Georgette Dover, MD  Location: [facility]  . LEFT HEART CATHETERIZATION WITH CORONARY ANGIOGRAM N/A 02/19/2014   Procedure: LEFT  HEART CATHETERIZATION WITH CORONARY ANGIOGRAM;  Surgeon: Troy Sine, MD;  Location: [facility] C[facility] LAB;  Service: Cardiovascular;  Laterality: N/A;  . LYSIS OF ADHESION N/A 01/15/2014   Procedure: LYSIS OF ADHESION;  Surgeon: Imogene Burn. Georgette Dover, MD;  Location: [facility];  Service: General;  Laterality: N/A;  . SMALL INTESTINE SURGERY    . TUBAL LIGATION    . VENTRAL HERNIA REPAIR N/A 12/2013   Prior ventral hernia repair-  strangulation. 2/15     Medications: Current Meds  Medication Sig  . ACCU-CHEK AVIVA PLUS test strip USE TO TEST BLOOD SUGARS AS DIRECTED  . amLODipine (NORVASC) 10 MG tablet Take 1 tablet (10 mg total) by mouth daily.  Marland Kitchen aspirin 81 MG tablet Take 81 mg by mouth daily.  Marland Kitchen atorvastatin (LIPITOR) 40 MG tablet Take 1 tablet (40 mg total) by mouth daily.  . Dulaglutide (TRULICITY) 1.5 0000000 SOPN Inject 1.5 mg into the skin once a week.  . furosemide (LASIX) 40 MG tablet Take 1 tablet (40 mg total) by mouth 2 (two) times daily.  . insulin lispro (HUMALOG KWIKPEN) 100 UNIT/ML KwikPen Inject 0.2 mLs (20 Units total) into the skin 3 (three) times daily with meals  . Insulin Pen Needle 32G X 4 MM MISC Use to inject insulin 4 times a day. The patient is insulin requiring, ICD 10 code 11.10. The patient injects 4 times per day.  . Insulin Syringe-Needle U-100 31G X 15/64" 0.3 ML MISC Use to inject Humalog before meals three times a day  . Lancets (ACCU-CHEK MULTICLIX) lancets Use as instructed  . LANTUS SOLOSTAR 100 UNIT/ML Solostar Pen INJECT 50 UNITS INTO THE SKIN DAILY  . metFORMIN (GLUCOPHAGE-XR) 500 MG 24 hr tablet Take 1,000 mg by mouth 2 (two) times daily.  . sacubitril-valsartan (ENTRESTO) 97-103 MG Take 1 tablet by mouth 2 (two) times daily.  Marland Kitchen triamcinolone (KENALOG) 0.025 % cream Apply 1 application topically 2 (two) times daily.  . [DISCONTINUED] amLODipine (NORVASC) 10 MG tablet Take 1 tablet (10 mg total) by mouth daily.  . [DISCONTINUED] atorvastatin (LIPITOR) 40 MG tablet Take 40 mg by mouth daily.  . [DISCONTINUED] furosemide (LASIX) 40 MG tablet Take 1 tablet (40 mg total) by mouth 2 (two) times daily.  . [DISCONTINUED] sacubitril-valsartan (ENTRESTO) 97-103 MG Take 1 tablet by mouth 2 (two) times daily.     Allergies: Allergies  Allergen Reactions  . Beta Adrenergic Blockers Other (See Comments)    Sinus pauses  . Canagliflozin Itching, Anxiety and Palpitations     Social History: The patient  reports that she quit smoking about 22 months ago. Her smoking use included cigarettes. She has a 18.75 pack-year smoking history. She has never used smokeless tobacco. She reports that she does not drink alcohol or use drugs.   Family History: The patient's family history includes Cerebrovascular Accident (age of onset: 65) in her mother; Coronary artery disease in her brother; Diabetes Mellitus II in her brother and mother; Healthy in her brother, brother, daughter, and son; Heart failure in her brother; Hypertension in her mother; Obesity in her brother, brother, and son; Pulmonary embolism (age of onset: 25) in her father.   Review of Systems: Please see the history of present illness.   All other systems are reviewed and negative.   Physical Exam: VS:  BP 136/76   Pulse 70   Ht 5' (1.524 m)   Wt 224 lb (101.6 kg)   LMP  (LMP Unknown)   SpO2 92%  BMI 43.75 kg/m  .  BMI Body mass index is 43.75 kg/m.  Wt Readings from Last 3 Encounters:  08/15/19 224 lb (101.6 kg)  05/29/19 224 lb 1.6 oz (101.7 kg)  12/30/18 219 lb (99.3 kg)    General: Pleasant. Alert and in no acute distress. She looks older than her stated age. She is obese.  HEENT: Normal.  Neck: Supple, no JVD, carotid bruits, or masses noted.  Cardiac: Regular rate and rhythm. No murmurs, rubs, or gallops. No edema.  Respiratory:  Lungs are clear to auscultation bilaterally with normal work of breathing.  GI: Soft and nontender.  MS: No deformity or atrophy. Gait and ROM intact.  Skin: Warm and dry. Color is normal.  Neuro:  Strength and sensation are intact and no gross focal deficits noted.  Psych: Alert, appropriate and with normal affect.   LABORATORY DATA:  EKG:  EKG is not ordered today.   Lab Results  Component Value Date   WBC 6.6 10/14/2018   HGB 12.2 10/14/2018   HCT 42.2 10/14/2018   PLT 321 10/14/2018   GLUCOSE 98 10/28/2018   CHOL 159 08/28/2015   TRIG 93  08/28/2015   HDL 50 08/28/2015   LDLCALC 90 08/28/2015   ALT 142 (H) 08/02/2017   AST 60 (H) 08/02/2017   NA 140 10/28/2018   K 4.5 10/28/2018   CL 95 (L) 10/28/2018   CREATININE 0.91 10/28/2018   BUN 18 10/28/2018   CO2 24 10/28/2018   TSH 1.624 10/15/2018   INR 1.13 07/31/2017   HGBA1C 11.0 (A) 05/29/2019       BNP (last 3 results) Recent Labs    10/14/18 1125  BNP 226.7*    ProBNP (last 3 results) No results for input(s): PROBNP in the last 8760 hours.   Other Studies Reviewed Today:  Echo Study Conclusions 06/2018  - Left ventricle: The cavity size was moderately dilated. There was moderate concentric hypertrophy. Systolic function was mildly reduced. The estimated ejection fraction was in the range of 45% to 50%. Diffuse hypokinesis. Doppler parameters are consistent with both elevated ventricular end-diastolic filling pressure and elevated left atrial filling pressure. - Left atrium: The atrium was moderately dilated.    ASSESSMENT & PLAN:    1.  NICM - with chronic systolic heart failure - EF of 45% per last echo in August of 2019 - no longer on Coreg due to prior sinus pauses - she remains on Entresto and diuretic therapy - doing well clinically. She needs lab today. No changes made today.   2. History of sinus pauses - sinoatrial node dysfunction - off beta blocker therapy. No symptoms reported. HR fine here today.   3. DM - per PCP - last A1C showing poor control.   4. HTN - BP is good here today - no changes made today.   5. Obesity - she says she is trying to do better. Encouragement given.   6. Prior elevation in LFTs noted - she has had no recent labs - checking here today.   7. COVID-19 Education: The signs and symptoms of COVID-19 were discussed with the patient and how to seek care for testing (follow up with PCP or arrange E-visit).  The importance of social distancing, staying at home, hand hygiene and wearing a mask  when out in public were discussed today.  Current medicines are reviewed with the patient today.  The patient does not have concerns regarding medicines other than what has been noted above.  The following changes have been made:  See above.  Labs/ tests ordered today include:    Orders Placed This Encounter  Procedures  . Basic metabolic panel  . CBC  . Hepatic function panel  . Lipid panel     Disposition:   FU with Dr. Marlou Porch in December per recall.  I am happy to see back as needed.   Patient is agreeable to this plan and will call if any problems develop in the interim.   SignedTruitt Merle, NP  08/15/2019 11:24 AM  [facility] [address] [place], [place]  [address] Phone: [phone] Fax: [phone]

## 2019-08-15 ENCOUNTER — Other Ambulatory Visit: Payer: Self-pay

## 2019-08-15 ENCOUNTER — Encounter: Payer: Self-pay | Admitting: Nurse Practitioner

## 2019-08-15 ENCOUNTER — Ambulatory Visit (INDEPENDENT_AMBULATORY_CARE_PROVIDER_SITE_OTHER): Payer: Medicare Other | Admitting: Nurse Practitioner

## 2019-08-15 VITALS — BP 136/76 | HR 70 | Ht 60.0 in | Wt 224.0 lb

## 2019-08-15 DIAGNOSIS — I10 Essential (primary) hypertension: Secondary | ICD-10-CM

## 2019-08-15 DIAGNOSIS — E7800 Pure hypercholesterolemia, unspecified: Secondary | ICD-10-CM

## 2019-08-15 DIAGNOSIS — I5042 Chronic combined systolic (congestive) and diastolic (congestive) heart failure: Secondary | ICD-10-CM | POA: Diagnosis not present

## 2019-08-15 DIAGNOSIS — I428 Other cardiomyopathies: Secondary | ICD-10-CM

## 2019-08-15 DIAGNOSIS — I5022 Chronic systolic (congestive) heart failure: Secondary | ICD-10-CM

## 2019-08-15 DIAGNOSIS — I495 Sick sinus syndrome: Secondary | ICD-10-CM

## 2019-08-15 DIAGNOSIS — Z7189 Other specified counseling: Secondary | ICD-10-CM

## 2019-08-15 LAB — BASIC METABOLIC PANEL
BUN/Creatinine Ratio: 20 (ref 12–28)
BUN: 15 mg/dL (ref 8–27)
CO2: 31 mmol/L — ABNORMAL HIGH (ref 20–29)
Calcium: 9.1 mg/dL (ref 8.7–10.3)
Chloride: 95 mmol/L — ABNORMAL LOW (ref 96–106)
Creatinine, Ser: 0.74 mg/dL (ref 0.57–1.00)
GFR calc Af Amer: 101 mL/min/{1.73_m2} (ref >59)
GFR calc non Af Amer: 88 mL/min/{1.73_m2} (ref >59)
Glucose: 106 mg/dL — ABNORMAL HIGH (ref 65–99)
Potassium: 4.3 mmol/L (ref 3.5–5.2)
Sodium: 140 mmol/L (ref 134–144)

## 2019-08-15 LAB — LIPID PANEL
Chol/HDL Ratio: 4.3 ratio (ref 0.0–4.4)
Cholesterol, Total: 187 mg/dL (ref 100–199)
HDL: 44 mg/dL (ref >39)
LDL Chol Calc (NIH): 107 mg/dL — ABNORMAL HIGH (ref 0–99)
Triglycerides: 209 mg/dL — ABNORMAL HIGH (ref 0–149)
VLDL Cholesterol Cal: 36 mg/dL (ref 5–40)

## 2019-08-15 LAB — CBC
Hematocrit: 42.5 % (ref 34.0–46.6)
Hemoglobin: 13.5 g/dL (ref 11.1–15.9)
MCH: 26.2 pg — ABNORMAL LOW (ref 26.6–33.0)
MCHC: 31.8 g/dL (ref 31.5–35.7)
MCV: 83 fL (ref 79–97)
Platelets: 258 10*3/uL (ref 150–450)
RBC: 5.15 x10E6/uL (ref 3.77–5.28)
RDW: 15.9 % — ABNORMAL HIGH (ref 11.7–15.4)
WBC: 6.5 10*3/uL (ref 3.4–10.8)

## 2019-08-15 LAB — HEPATIC FUNCTION PANEL
ALT: 13 IU/L (ref 0–32)
AST: 16 IU/L (ref 0–40)
Albumin: 3.9 g/dL (ref 3.8–4.8)
Alkaline Phosphatase: 98 IU/L (ref 39–117)
Bilirubin Total: 0.5 mg/dL (ref 0.0–1.2)
Bilirubin, Direct: 0.15 mg/dL (ref 0.00–0.40)
Total Protein: 6.7 g/dL (ref 6.0–8.5)

## 2019-08-15 MED ORDER — SACUBITRIL-VALSARTAN 97-103 MG PO TABS: 1.0000 | ORAL_TABLET | Freq: Two times a day (BID) | ORAL | 3 refills | Status: DC

## 2019-08-15 MED ORDER — FUROSEMIDE 40 MG PO TABS: 40.0000 mg | ORAL_TABLET | Freq: Two times a day (BID) | ORAL | 3 refills | Status: DC

## 2019-08-15 MED ORDER — AMLODIPINE BESYLATE 10 MG PO TABS: 10.0000 mg | ORAL_TABLET | Freq: Every day | ORAL | 3 refills | Status: DC

## 2019-08-15 MED ORDER — ATORVASTATIN CALCIUM 40 MG PO TABS: 40.0000 mg | ORAL_TABLET | Freq: Every day | ORAL | 3 refills | Status: DC

## 2019-08-15 NOTE — Patient Instructions (Addendum)
After Visit Summary:  We will be checking the following labs today - BMET, CBC, HPF and Lipids   Medication Instructions:    Continue with your current medicines.   I have sent in your refills today   If you need a refill on your cardiac medications before your next appointment, please call your pharmacy.     Testing/Procedures To Be Arranged:  N/A  Follow-Up:   See Dr. Marlou Porch in December per recall    At [facility], you and your health needs are our priority.  As part of our continuing mission to provide you with exceptional heart care, we have created designated Provider Care Teams.  These Care Teams include your primary Cardiologist (physician) and Advanced Practice Providers (APPs -  Physician Assistants and Nurse Practitioners) who all work together to provide you with the care you need, when you need it.  Special Instructions:  . Stay safe, stay home, wash your hands for at least 20 seconds and wear a mask when out in public.  . It was good to talk with you today. . Try to increase your exercise    Call the [place] office at [phone] if you have any questions, problems or concerns.

## 2019-08-20 DIAGNOSIS — J9611 Chronic respiratory failure with hypoxia: Secondary | ICD-10-CM | POA: Diagnosis not present

## 2019-08-24 ENCOUNTER — Telehealth: Payer: Self-pay | Admitting: Cardiology

## 2019-08-24 NOTE — Telephone Encounter (Signed)
New Message  ° ° ° °Patient returning call for lab results  °

## 2019-08-24 NOTE — Telephone Encounter (Signed)
Reviewed recent lab results with pt who states understanding.  She will continue her current treatment plan as instructed.  Of note - pt reports she does not use her MyChart account.

## 2019-09-02 ENCOUNTER — Other Ambulatory Visit: Payer: Self-pay | Admitting: Student in an Organized Health Care Education/Training Program

## 2019-09-02 DIAGNOSIS — E113393 Type 2 diabetes mellitus with moderate nonproliferative diabetic retinopathy without macular edema, bilateral: Secondary | ICD-10-CM

## 2019-09-04 ENCOUNTER — Ambulatory Visit (INDEPENDENT_AMBULATORY_CARE_PROVIDER_SITE_OTHER): Payer: Medicare Other | Admitting: Student in an Organized Health Care Education/Training Program

## 2019-09-04 ENCOUNTER — Other Ambulatory Visit: Payer: Self-pay

## 2019-09-04 ENCOUNTER — Encounter: Payer: Self-pay | Admitting: Student in an Organized Health Care Education/Training Program

## 2019-09-04 VITALS — BP 131/76 | HR 69 | Temp 98.1°F | Ht 65.0 in | Wt 223.9 lb

## 2019-09-04 DIAGNOSIS — E1159 Type 2 diabetes mellitus with other circulatory complications: Secondary | ICD-10-CM | POA: Diagnosis not present

## 2019-09-04 DIAGNOSIS — E113399 Type 2 diabetes mellitus with moderate nonproliferative diabetic retinopathy without macular edema, unspecified eye: Secondary | ICD-10-CM

## 2019-09-04 DIAGNOSIS — Z794 Long term (current) use of insulin: Secondary | ICD-10-CM

## 2019-09-04 DIAGNOSIS — Z23 Encounter for immunization: Secondary | ICD-10-CM

## 2019-09-04 DIAGNOSIS — E041 Nontoxic single thyroid nodule: Secondary | ICD-10-CM

## 2019-09-04 DIAGNOSIS — E113393 Type 2 diabetes mellitus with moderate nonproliferative diabetic retinopathy without macular edema, bilateral: Secondary | ICD-10-CM | POA: Diagnosis not present

## 2019-09-04 DIAGNOSIS — I2510 Atherosclerotic heart disease of native coronary artery without angina pectoris: Secondary | ICD-10-CM

## 2019-09-04 DIAGNOSIS — I10 Essential (primary) hypertension: Secondary | ICD-10-CM

## 2019-09-04 DIAGNOSIS — Z1211 Encounter for screening for malignant neoplasm of colon: Secondary | ICD-10-CM

## 2019-09-04 DIAGNOSIS — Z79899 Other long term (current) drug therapy: Secondary | ICD-10-CM

## 2019-09-04 HISTORY — DX: Nontoxic single thyroid nodule: E04.1

## 2019-09-04 LAB — POCT GLYCOSYLATED HEMOGLOBIN (HGB A1C): Hemoglobin A1C: 9.5 % — AB (ref 4.0–5.6)

## 2019-09-04 LAB — GLUCOSE, CAPILLARY: Glucose-Capillary: 184 mg/dL — ABNORMAL HIGH (ref 70–99)

## 2019-09-04 NOTE — Progress Notes (Signed)
   Assessment and Plan:  See Encounters tab for problem-based medical decision making.   __________________________________________________________  HPI:   63 year old woman here for follow-up of diabetes.  Patient reports doing fairly well at home.  She reports good compliance with long and short acting insulin.  She is been working on her nutrition.  She lives with her daughter and 2 grandchildren.  Does not do any dedicated exercise but is active throughout the day keeping the house in order.  Denies any chest pain or shortness of breath.  Reports good exertional capacity, can walk about a block before she feels short of breath.  Has trouble lifting heavy things like a full laundry basket.  No fevers or chills.  No cough.  At our last visit we increased her mealtime insulin, she denies any hypoglycemic events.  Patient is retired, used to work at a [employer] before that.  Two grandchildren, aged 20 and 62.  She is independent in activities of daily living.  __________________________________________________________  Problem List: Patient Active Problem List   Diagnosis Date Noted  . Coronary artery disease involving native coronary artery of native heart without angina pectoris 11/09/2014    Priority: High  . Chronic combined systolic and diastolic heart failure ([place]) 05/10/2014    Priority: High  . Tobacco use disorder 01/15/2014    Priority: High  . Type 2 diabetes mellitus with moderate nonproliferative diabetic retinopathy ([place]) 10/15/2013    Priority: High  . Essential hypertension 10/15/2013    Priority: High  . Sinoatrial node dysfunction ([facility]) 10/17/2018    Priority: Medium  . Aortic atherosclerosis ([place]) 09/24/2017    Priority: Medium  . Gastroesophageal reflux disease 09/24/2017    Priority: Medium  . Obesity (BMI 30.0-34.9) 09/24/2017    Priority: Medium  . Hyperlipidemia     Priority: Medium  . Nocturnal hypoxemia 12/30/2018    Priority:  Low  . Psoriasis 09/24/2017    Priority: Low  . 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 Seasonal allergic rhinitis due to pollen 09/24/2017    Priority: Low  . Healthcare maintenance 09/24/2017    Priority: Low  . 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 Thyroid nodule 09/04/2019    Medications: Reconciled today in Epic __________________________________________________________  Physical Exam:  Vital Signs: Vitals:   09/04/19 0832 09/04/19 0905  BP: (!) 158/75 131/76  Pulse: 87 69  Temp: 98.1 F (36.7 C)   TempSrc: Oral   SpO2: 96%   Weight: 223 lb 14.4 oz (101.6 kg)   Height: 5\' 5"  (1.651 m)     Gen: Well appearing, NAD Neck: Mild diffuse thyroid enlargement, right thyroid has a 3 cm palpable nodule.  No cervical adenopathy. CV: RRR, no murmurs Pulm: Normal effort, CTA throughout, no wheezing Ext: Warm, no edema, normal joints

## 2019-09-04 NOTE — Assessment & Plan Note (Signed)
Blood pressure is well controlled.  Plan to continue with amlodipine 10 mg daily and Entresto 200 mg twice daily.  Renal function is excellent.

## 2019-09-04 NOTE — Assessment & Plan Note (Signed)
Improved but not yet at goal. A1c at 9.5%.  Diabetes complicated by retinopathy and nonobstructive coronary artery disease.  I reviewed her glucose logs which show some elevated blood sugars in the evening time likely as a result of nutrition.  We talked about improving daily sugar intake, she set a goal of discontinuing sodas.  Plan is to continue with Lantus 50 units nightly, Humalog 20 units 3 times daily with meals, dulaglutide 1.5 mg weekly, metformin 1000 mg twice daily.  She is unable to tolerate SGLT2 inhibitors due to a reaction of pruritus in the past.  We will see if her A1c continues to improve with these current interventions.

## 2019-09-04 NOTE — Assessment & Plan Note (Signed)
On exam there is a 3 centimeter palpable nodule on the right thyroid gland.  She has no symptoms of hyperthyroidism, last TSH less than a year ago was normal.  Has never had testing of her thyroid gland before.  May just be enlarged goiter, but we will order ultrasound to rule out a high risk thyroid nodule.

## 2019-09-04 NOTE — Patient Instructions (Signed)
It was a pleasure seeing you today.  Today we talked about your diabetes.  Your blood sugar is better controlled.  Please continue your insulin and other medications as already prescribed.  We set a goal of not using anymore soda drinks.  I want to see you back in 3 months.  I felt a nodule on your the right thyroid gland.  This is probably nothing to worry about, but I do want an ultrasound of your thyroid so we can ensure that this is not a mass or tumor.  We talked about colon cancer screening and you will do another mail-in stool based test.

## 2019-09-11 ENCOUNTER — Ambulatory Visit (HOSPITAL_COMMUNITY): Admission: RE | Admit: 2019-09-11 | Payer: Medicare Other | Source: Ambulatory Visit

## 2019-09-19 DIAGNOSIS — J9611 Chronic respiratory failure with hypoxia: Secondary | ICD-10-CM | POA: Diagnosis not present

## 2019-09-20 ENCOUNTER — Ambulatory Visit (HOSPITAL_COMMUNITY)
Admission: RE | Admit: 2019-09-20 | Discharge: 2019-09-20 | Disposition: A | Discharge status: Home / Self Care | Payer: Medicare Other | Source: Ambulatory Visit | Attending: Student in an Organized Health Care Education/Training Program | Admitting: Student in an Organized Health Care Education/Training Program

## 2019-09-20 ENCOUNTER — Other Ambulatory Visit: Payer: Self-pay

## 2019-09-20 DIAGNOSIS — E041 Nontoxic single thyroid nodule: Secondary | ICD-10-CM | POA: Diagnosis not present

## 2019-09-21 ENCOUNTER — Encounter: Payer: Self-pay | Admitting: Student in an Organized Health Care Education/Training Program

## 2019-09-28 ENCOUNTER — Other Ambulatory Visit: Payer: Self-pay | Admitting: Student in an Organized Health Care Education/Training Program

## 2019-09-28 DIAGNOSIS — E113393 Type 2 diabetes mellitus with moderate nonproliferative diabetic retinopathy without macular edema, bilateral: Secondary | ICD-10-CM

## 2019-10-20 ENCOUNTER — Other Ambulatory Visit: Payer: Self-pay

## 2019-10-20 ENCOUNTER — Emergency Department (HOSPITAL_COMMUNITY): Payer: Medicare Other

## 2019-10-20 ENCOUNTER — Inpatient Hospital Stay (HOSPITAL_COMMUNITY)
Admission: EM | Admit: 2019-10-20 | Discharge: 2019-10-24 | DRG: 291 | Disposition: A | Discharge status: Home / Self Care | Payer: Medicare Other | Attending: Student in an Organized Health Care Education/Training Program | Admitting: Student in an Organized Health Care Education/Training Program

## 2019-10-20 ENCOUNTER — Encounter (HOSPITAL_COMMUNITY): Payer: Self-pay | Admitting: Emergency Medicine

## 2019-10-20 DIAGNOSIS — J189 Pneumonia, unspecified organism: Secondary | ICD-10-CM | POA: Diagnosis not present

## 2019-10-20 DIAGNOSIS — I509 Heart failure, unspecified: Secondary | ICD-10-CM | POA: Diagnosis not present

## 2019-10-20 DIAGNOSIS — Z8249 Family history of ischemic heart disease and other diseases of the circulatory system: Secondary | ICD-10-CM

## 2019-10-20 DIAGNOSIS — L409 Psoriasis, unspecified: Secondary | ICD-10-CM | POA: Diagnosis present

## 2019-10-20 DIAGNOSIS — Z20828 Contact with and (suspected) exposure to other viral communicable diseases: Secondary | ICD-10-CM | POA: Diagnosis present

## 2019-10-20 DIAGNOSIS — I5033 Acute on chronic diastolic (congestive) heart failure: Secondary | ICD-10-CM | POA: Diagnosis present

## 2019-10-20 DIAGNOSIS — R0602 Shortness of breath: Secondary | ICD-10-CM

## 2019-10-20 DIAGNOSIS — E113399 Type 2 diabetes mellitus with moderate nonproliferative diabetic retinopathy without macular edema, unspecified eye: Secondary | ICD-10-CM | POA: Diagnosis not present

## 2019-10-20 DIAGNOSIS — I2510 Atherosclerotic heart disease of native coronary artery without angina pectoris: Secondary | ICD-10-CM | POA: Diagnosis not present

## 2019-10-20 DIAGNOSIS — E873 Alkalosis: Secondary | ICD-10-CM | POA: Diagnosis not present

## 2019-10-20 DIAGNOSIS — E119 Type 2 diabetes mellitus without complications: Secondary | ICD-10-CM | POA: Diagnosis not present

## 2019-10-20 DIAGNOSIS — Z888 Allergy status to other drugs, medicaments and biological substances status: Secondary | ICD-10-CM

## 2019-10-20 DIAGNOSIS — Z794 Long term (current) use of insulin: Secondary | ICD-10-CM | POA: Diagnosis not present

## 2019-10-20 DIAGNOSIS — Z8673 Personal history of transient ischemic attack (TIA), and cerebral infarction without residual deficits: Secondary | ICD-10-CM | POA: Diagnosis not present

## 2019-10-20 DIAGNOSIS — K5730 Diverticulosis of large intestine without perforation or abscess without bleeding: Secondary | ICD-10-CM | POA: Diagnosis not present

## 2019-10-20 DIAGNOSIS — I5021 Acute systolic (congestive) heart failure: Secondary | ICD-10-CM | POA: Diagnosis not present

## 2019-10-20 DIAGNOSIS — Z823 Family history of stroke: Secondary | ICD-10-CM | POA: Diagnosis not present

## 2019-10-20 DIAGNOSIS — K219 Gastro-esophageal reflux disease without esophagitis: Secondary | ICD-10-CM | POA: Diagnosis present

## 2019-10-20 DIAGNOSIS — R079 Chest pain, unspecified: Secondary | ICD-10-CM | POA: Diagnosis present

## 2019-10-20 DIAGNOSIS — Z833 Family history of diabetes mellitus: Secondary | ICD-10-CM | POA: Diagnosis not present

## 2019-10-20 DIAGNOSIS — Z9111 Patient's noncompliance with dietary regimen: Secondary | ICD-10-CM | POA: Diagnosis not present

## 2019-10-20 DIAGNOSIS — I110 Hypertensive heart disease with heart failure: Principal | ICD-10-CM | POA: Diagnosis present

## 2019-10-20 DIAGNOSIS — I5042 Chronic combined systolic (congestive) and diastolic (congestive) heart failure: Secondary | ICD-10-CM

## 2019-10-20 DIAGNOSIS — Z9981 Dependence on supplemental oxygen: Secondary | ICD-10-CM | POA: Diagnosis not present

## 2019-10-20 DIAGNOSIS — I10 Essential (primary) hypertension: Secondary | ICD-10-CM | POA: Diagnosis present

## 2019-10-20 DIAGNOSIS — J9620 Acute and chronic respiratory failure, unspecified whether with hypoxia or hypercapnia: Secondary | ICD-10-CM | POA: Diagnosis present

## 2019-10-20 DIAGNOSIS — Z87891 Personal history of nicotine dependence: Secondary | ICD-10-CM | POA: Diagnosis not present

## 2019-10-20 DIAGNOSIS — Z7982 Long term (current) use of aspirin: Secondary | ICD-10-CM | POA: Diagnosis not present

## 2019-10-20 DIAGNOSIS — J9611 Chronic respiratory failure with hypoxia: Secondary | ICD-10-CM | POA: Diagnosis not present

## 2019-10-20 DIAGNOSIS — Z79899 Other long term (current) drug therapy: Secondary | ICD-10-CM | POA: Diagnosis not present

## 2019-10-20 DIAGNOSIS — I5023 Acute on chronic systolic (congestive) heart failure: Secondary | ICD-10-CM | POA: Diagnosis present

## 2019-10-20 DIAGNOSIS — E785 Hyperlipidemia, unspecified: Secondary | ICD-10-CM | POA: Diagnosis not present

## 2019-10-20 LAB — CBC WITH DIFFERENTIAL/PLATELET
Abs Immature Granulocytes: 0.02 10*3/uL (ref 0.00–0.07)
Basophils Absolute: 0 10*3/uL (ref 0.0–0.1)
Basophils Relative: 0 %
Eosinophils Absolute: 0.2 10*3/uL (ref 0.0–0.5)
Eosinophils Relative: 2 %
HCT: 42.7 % (ref 36.0–46.0)
Hemoglobin: 12.9 g/dL (ref 12.0–15.0)
Immature Granulocytes: 0 %
Lymphocytes Relative: 18 %
Lymphs Abs: 1.4 10*3/uL (ref 0.7–4.0)
MCH: 27.2 pg (ref 26.0–34.0)
MCHC: 30.2 g/dL (ref 30.0–36.0)
MCV: 89.9 fL (ref 80.0–100.0)
Monocytes Absolute: 0.6 10*3/uL (ref 0.1–1.0)
Monocytes Relative: 8 %
Neutro Abs: 5.7 10*3/uL (ref 1.7–7.7)
Neutrophils Relative %: 72 %
Platelets: 316 10*3/uL (ref 150–400)
RBC: 4.75 MIL/uL (ref 3.87–5.11)
RDW: 17.9 % — ABNORMAL HIGH (ref 11.5–15.5)
WBC: 7.9 10*3/uL (ref 4.0–10.5)
nRBC: 0 % (ref 0.0–0.2)

## 2019-10-20 LAB — TROPONIN I (HIGH SENSITIVITY)
Troponin I (High Sensitivity): 18 ng/L — ABNORMAL HIGH (ref <18)
Troponin I (High Sensitivity): 20 ng/L — ABNORMAL HIGH (ref <18)

## 2019-10-20 LAB — COMPREHENSIVE METABOLIC PANEL
ALT: 15 U/L (ref 0–44)
AST: 22 U/L (ref 15–41)
Albumin: 2.9 g/dL — ABNORMAL LOW (ref 3.5–5.0)
Alkaline Phosphatase: 87 U/L (ref 38–126)
Anion gap: 11 (ref 5–15)
BUN: 12 mg/dL (ref 8–23)
CO2: 31 mmol/L (ref 22–32)
Calcium: 8.8 mg/dL — ABNORMAL LOW (ref 8.9–10.3)
Chloride: 94 mmol/L — ABNORMAL LOW (ref 98–111)
Creatinine, Ser: 0.85 mg/dL (ref 0.44–1.00)
GFR calc Af Amer: >60 mL/min (ref >60)
GFR calc non Af Amer: >60 mL/min (ref >60)
Glucose, Bld: 292 mg/dL — ABNORMAL HIGH (ref 70–99)
Potassium: 3.5 mmol/L (ref 3.5–5.1)
Sodium: 136 mmol/L (ref 135–145)
Total Bilirubin: 0.7 mg/dL (ref 0.3–1.2)
Total Protein: 6.2 g/dL — ABNORMAL LOW (ref 6.5–8.1)

## 2019-10-20 LAB — BRAIN NATRIURETIC PEPTIDE: B Natriuretic Peptide: 429.6 pg/mL — ABNORMAL HIGH (ref 0.0–100.0)

## 2019-10-20 MED ORDER — FUROSEMIDE 10 MG/ML IJ SOLN
40.0000 mg | Freq: Once | INTRAMUSCULAR | Status: AC
  Administered 2019-10-20: 40 mg via INTRAVENOUS
  Filled 2019-10-20: qty 4

## 2019-10-20 NOTE — ED Triage Notes (Signed)
Pt c/o SOB and bilateral leg swollen for a week getting worse. Pt states she has hx of CHF.

## 2019-10-20 NOTE — H&P (Addendum)
Date: 10/20/2019               [name] Name:  Alicia [name] MRN: [id number]  DOB: [birth year] Age / Sex: 63 y.o., female   PCP: Axel Filler, MD         Medical Service: Internal Medicine Teaching Service         Attending Physician: Dr. Evette Doffing, Mallie Mussel, *    First Contact: Dr. Ronnald Ramp  Pager: [phone]  Second Contact: Dr. Eileen Stanford  Pager: [phone]       After Hours (After 5p/  First Contact Pager: [phone]  weekends / holidays): Second Contact Pager: [phone]   Chief Complaint: SOB  History of Present Illness:   Alicia [name] is a 63 year old female with past medical history significant for HFrEF (E 45-50% 06/2018) on 3L supplemental O2 at baseline, T2DM, CAD, HTN, HLD, GERD who presented to Alicia ED with shortness of breath. Alicia [name] says her symptoms started roughly a week and a half ago, stating that Alicia [name] for started feeling generally weak. Alicia [name] noticed that Alicia [name] would become easily fatigued with activity that previously would not fatigue her. For example, Alicia [name] has become short of breath while walking to Alicia mailbox and back to Alicia house. Alicia [name] becomes short of breath with washing dishes and has to sit down to take a break.  Alicia [name] also notes that Alicia [name] has to prop her head up with more pillows at night (3-4) because Alicia [name] gets short of breath if Alicia [name] lays flat.  Alicia [name] states that Alicia [name] is experiencing swelling in her lower legs up to her hips that have been increasing over Alicia past week and a half. Alicia [name] increased her supplemental oxygen to 5 L as well. On Thanksgiving day, Alicia [name] ate "everything I was not supposed to". Her symptoms acutely got worse from there.  Alicia [name] has not weighed herself throughout her illness, however Alicia [name] is currently 3 kg above her dry weight (101.2 kg from 98.5 kg) Alicia [name] has denied any sick contacts, fevers, chills, muscle aches, headache, changes in her vision, chest pain, nausea or vomiting, changes in her bowel and bladder habits.  In Alicia ED, a CBC, CMP, troponin,  BNP were obtained.  Notable labs were significant for an elevated BNP to 429.6.  Troponins were negative x2.  CXR was notable for v bilateral hazy interstitial opacities and cardiomegaly, consistent with findings with CHF.  Alicia [name] received a dose of IV Lasix 40 mg in Alicia ED.  Meds:  Current Meds  Medication Sig   amLODipine (NORVASC) 10 MG tablet Take 1 tablet (10 mg total) by mouth daily.   aspirin 81 MG tablet Take 81 mg by mouth daily.   atorvastatin (LIPITOR) 40 MG tablet Take 1 tablet (40 mg total) by mouth daily.   furosemide (LASIX) 40 MG tablet Take 1 tablet (40 mg total) by mouth 2 (two) times daily.   insulin lispro (HUMALOG KWIKPEN) 100 UNIT/ML KwikPen Inject 0.2 mLs (20 Units total) into Alicia skin 3 (three) times daily with meals   LANTUS SOLOSTAR 100 UNIT/ML Solostar Pen INJECT 50 UNITS INTO Alicia SKIN EVERY DAY ([name] taking differently: Inject 50 Units into Alicia skin at bedtime. )   metFORMIN (GLUCOPHAGE-XR) 500 MG 24 hr tablet Take 1,000 mg by mouth 2 (two) times daily.   sacubitril-valsartan (ENTRESTO) 97-103 MG Take 1 tablet by mouth 2 (two) times daily.   triamcinolone (KENALOG) 0.025 % cream Apply 1 application topically 2 (two) times daily.  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  Non-functioning   Aortic atherosclerosis ([place]) 09/24/2017   Asymptomatic, found on CT scan   Blood transfusion without reported diagnosis    Chronic Systolic Heart Failure 123456   Felt to be non-ischemic and secondary to hypertension.  Echo (05/28/2014): LVEF 25%.  Is not interested in AICD placement.    COPD exacerbation ([place])    Coronary artery disease involving native coronary artery of native heart without angina pectoris 11/09/2014   Cardiac cath (02/18/2014): Non-obstructive, mRCA 30%, dRCA 60%   Cystocele with uterine prolapse - grade 3 02/12/2016   Not interested in pessary after trying   Diverticulosis of colon 09/24/2017   Diverticulosis of colon 09/24/2017   Essential hypertension 10/15/2013   Gastroesophageal reflux disease 09/24/2017   History of cerebrovascular accident 05/10/2013   Per [name] report 6 previous strokes, most recent on 05/10/13.  No residual deficits.   History of cerebrovascular accident 05/10/2013   Per [name] report 6 previous strokes, most recent on 05/10/13.  No residual deficits.   Hyperlipidemia    Overweight (BMI 25.0-29.9) 09/24/2017   Psoriasis 09/24/2017   Seasonal allergic rhinitis due to pollen 09/24/2017   Spring and early Fall   Small Bowel Obstruction (SBO) 01/13/2014   Ex-Lap & Lysis of Adhesion, 01/15/2014   Thyroid nodule 09/04/2019   Tobacco use disorder 01/15/2014   Type 2 diabetes mellitus with moderate nonproliferative diabetic retinopathy ([place]) 10/15/2013   Family History:  Family History  Problem Relation Age of Onset   Diabetes Mellitus II Mother    Hypertension Mother    Cerebrovascular Accident Mother 3       Massive, resulted in death   Heart failure Brother    Obesity Brother    Pulmonary embolism Father 70       Resulted in sudden death   Coronary artery disease Brother    Obesity Brother    Diabetes Mellitus II Brother    Healthy Brother    Healthy Brother    Obesity Son    Healthy Son    Healthy Daughter    Social History:  Social History   Tobacco Use   Smoking status: Former Smoker    Packs/day: 0.75    Years: 25.00    Pack years: 18.75    Types: Cigarettes    Quit date: 09/22/2017    Years since quitting: 2.0   Smokeless tobacco: Never Used   Tobacco comment: Smoking < 9 cigs per day  Substance Use  Topics   Alcohol use: No    Alcohol/week: 0.0 standard drinks   Drug use: No   Review of Systems: A complete ROS was negative except as per HPI.  Physical Exam: Blood pressure (!) 168/70, pulse 68, temperature 98.4 F (36.9 C), temperature source Oral, resp. rate 16, height 5\' 6"  (1.676 m), weight 101.2 kg, SpO2 95 %. Physical Exam Vitals signs reviewed.  Constitutional:      General: Alicia [name] is in acute distress.     Appearance: Alicia [name] is obese. Alicia [name] is not ill-appearing or toxic-appearing.  HENT:     Head: Normocephalic and atraumatic.  Eyes:     Extraocular Movements: Extraocular movements intact.  Cardiovascular:     Rate and Rhythm: Normal rate and regular rhythm.     Heart sounds: Normal heart sounds. No friction rub. No gallop.   Pulmonary:     Effort: Pulmonary effort is normal. No accessory muscle usage.     Breath sounds: Decreased breath sounds (due to large body habitus) present.     Comments: Crackles appreciated in bilateral lower lung fields Abdominal:     Palpations: There is no hepatomegaly or splenomegaly.     Tenderness: There is no abdominal tenderness. There is no rebound.  Musculoskeletal:     Right lower leg: Edema (2+) present.     Left lower leg: Edema (2+) present.  Skin:    General: Skin is warm.     Coloration: Skin is not cyanotic.     Findings: No rash.  Neurological:     General: No focal deficit present.     Mental Status: Alicia [name] is alert and oriented to person, place, and time.  Psychiatric:        Mood and Affect: Mood normal.        Behavior: Behavior normal.    EKG: Normal sinus rhythm Left axis deviation Lead I and AVL - T wave inversions  CXR:  IMPRESSION: Features of edema and cardiomegaly compatible with CHF  Assessment & Plan by Problem: Active Problems:   CHF exacerbation [facility])  Alicia [name] is a 62 year old female with past medical history significant for HFrEF (E 45-50% 06/2018) on 3L supplemental O2 at baseline, T2DM, CAD, HTN,  HLD, GERD who presented to [facility] ED increased fatigue, shortness of breath, and lower extremity edema that is progressed over Alicia past week and a half.  This is in Alicia context of dietary indiscretion on Thanksgiving day.  Her presentation is most consistent with an exacerbation of her underlying HFrEF.  #HFrEF exacerbation (EF 45-50% 06/2018): Presented with weeks of worsening SOB and Edema that accelerated after dietary indiscretion during Alicia Thanksgiving holiday.  [name] is currently about 3 kg above her recorded dry weight (98.5 kg) [name] received a dose of IV Lasix 40 mg in Alicia ED. Continue IV Lasix 40 mg BID Echocardiogram ordered Strict I's and O's, daily weights Telemetry Daily BMP  #T2DM: Pt takes Lantus 50 daily, Humalog 20U TID, Metformin, Trulicity at home. Lantus 30U nightly NovoLog 10U 3 times daily with meals SSI  #CAD #HTN #HLD Amlodipine 10 mg daily Entresto (sacubitril-valsartan 97-103 mg) 1 tablet twice daily Atorvastatin 40 mg daily  #GERD:  #FEN/GI: Diet: Heart healthy/carb modified Fluids: None  #DVT prophylaxis Lovenox 40 mg subq injections daily  #CODE STATUS: FULL  #Dispo: Admit [name] to Inpatient with expected length of stay greater than 2 midnights.  Signed: [name], MD Internal Medicine, PGY1 Pager: [phone]  10/21/2019,2:31 AM
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
## 2019-10-21 ENCOUNTER — Inpatient Hospital Stay (HOSPITAL_COMMUNITY): Payer: Medicare Other

## 2019-10-21 DIAGNOSIS — J9620 Acute and chronic respiratory failure, unspecified whether with hypoxia or hypercapnia: Secondary | ICD-10-CM | POA: Diagnosis present

## 2019-10-21 DIAGNOSIS — K5730 Diverticulosis of large intestine without perforation or abscess without bleeding: Secondary | ICD-10-CM | POA: Diagnosis present

## 2019-10-21 DIAGNOSIS — Z833 Family history of diabetes mellitus: Secondary | ICD-10-CM | POA: Diagnosis not present

## 2019-10-21 DIAGNOSIS — L409 Psoriasis, unspecified: Secondary | ICD-10-CM | POA: Diagnosis present

## 2019-10-21 DIAGNOSIS — E785 Hyperlipidemia, unspecified: Secondary | ICD-10-CM | POA: Diagnosis not present

## 2019-10-21 DIAGNOSIS — E113399 Type 2 diabetes mellitus with moderate nonproliferative diabetic retinopathy without macular edema, unspecified eye: Secondary | ICD-10-CM | POA: Diagnosis not present

## 2019-10-21 DIAGNOSIS — E119 Type 2 diabetes mellitus without complications: Secondary | ICD-10-CM | POA: Diagnosis not present

## 2019-10-21 DIAGNOSIS — Z87891 Personal history of nicotine dependence: Secondary | ICD-10-CM | POA: Diagnosis not present

## 2019-10-21 DIAGNOSIS — R0602 Shortness of breath: Secondary | ICD-10-CM | POA: Diagnosis present

## 2019-10-21 DIAGNOSIS — Z8249 Family history of ischemic heart disease and other diseases of the circulatory system: Secondary | ICD-10-CM | POA: Diagnosis not present

## 2019-10-21 DIAGNOSIS — Z888 Allergy status to other drugs, medicaments and biological substances status: Secondary | ICD-10-CM | POA: Diagnosis not present

## 2019-10-21 DIAGNOSIS — Z8673 Personal history of transient ischemic attack (TIA), and cerebral infarction without residual deficits: Secondary | ICD-10-CM | POA: Diagnosis not present

## 2019-10-21 DIAGNOSIS — I2510 Atherosclerotic heart disease of native coronary artery without angina pectoris: Secondary | ICD-10-CM | POA: Diagnosis not present

## 2019-10-21 DIAGNOSIS — I110 Hypertensive heart disease with heart failure: Secondary | ICD-10-CM | POA: Diagnosis not present

## 2019-10-21 DIAGNOSIS — Z9111 Patient's noncompliance with dietary regimen: Secondary | ICD-10-CM | POA: Diagnosis not present

## 2019-10-21 DIAGNOSIS — Z79899 Other long term (current) drug therapy: Secondary | ICD-10-CM | POA: Diagnosis not present

## 2019-10-21 DIAGNOSIS — I5021 Acute systolic (congestive) heart failure: Secondary | ICD-10-CM

## 2019-10-21 DIAGNOSIS — J189 Pneumonia, unspecified organism: Secondary | ICD-10-CM | POA: Diagnosis not present

## 2019-10-21 DIAGNOSIS — E873 Alkalosis: Secondary | ICD-10-CM | POA: Diagnosis not present

## 2019-10-21 DIAGNOSIS — Z823 Family history of stroke: Secondary | ICD-10-CM | POA: Diagnosis not present

## 2019-10-21 DIAGNOSIS — I5033 Acute on chronic diastolic (congestive) heart failure: Secondary | ICD-10-CM | POA: Diagnosis not present

## 2019-10-21 DIAGNOSIS — K219 Gastro-esophageal reflux disease without esophagitis: Secondary | ICD-10-CM | POA: Diagnosis present

## 2019-10-21 DIAGNOSIS — Z794 Long term (current) use of insulin: Secondary | ICD-10-CM | POA: Diagnosis not present

## 2019-10-21 DIAGNOSIS — Z7982 Long term (current) use of aspirin: Secondary | ICD-10-CM | POA: Diagnosis not present

## 2019-10-21 DIAGNOSIS — I5023 Acute on chronic systolic (congestive) heart failure: Secondary | ICD-10-CM | POA: Diagnosis not present

## 2019-10-21 DIAGNOSIS — Z20828 Contact with and (suspected) exposure to other viral communicable diseases: Secondary | ICD-10-CM | POA: Diagnosis present

## 2019-10-21 LAB — SARS CORONAVIRUS 2 (TAT 6-24 HRS): SARS Coronavirus 2: NEGATIVE

## 2019-10-21 LAB — CBG MONITORING, ED: Glucose-Capillary: 196 mg/dL — ABNORMAL HIGH (ref 70–99)

## 2019-10-21 LAB — BASIC METABOLIC PANEL
Anion gap: 11 (ref 5–15)
BUN: 11 mg/dL (ref 8–23)
CO2: 33 mmol/L — ABNORMAL HIGH (ref 22–32)
Calcium: 8.7 mg/dL — ABNORMAL LOW (ref 8.9–10.3)
Chloride: 94 mmol/L — ABNORMAL LOW (ref 98–111)
Creatinine, Ser: 0.8 mg/dL (ref 0.44–1.00)
GFR calc Af Amer: >60 mL/min (ref >60)
GFR calc non Af Amer: >60 mL/min (ref >60)
Glucose, Bld: 167 mg/dL — ABNORMAL HIGH (ref 70–99)
Potassium: 3.6 mmol/L (ref 3.5–5.1)
Sodium: 138 mmol/L (ref 135–145)

## 2019-10-21 LAB — GLUCOSE, CAPILLARY
Glucose-Capillary: 144 mg/dL — ABNORMAL HIGH (ref 70–99)
Glucose-Capillary: 136 mg/dL — ABNORMAL HIGH (ref 70–99)
Glucose-Capillary: 166 mg/dL — ABNORMAL HIGH (ref 70–99)
Glucose-Capillary: 235 mg/dL — ABNORMAL HIGH (ref 70–99)

## 2019-10-21 LAB — HIV ANTIBODY (ROUTINE TESTING W REFLEX): HIV Screen 4th Generation wRfx: NONREACTIVE

## 2019-10-21 LAB — ECHOCARDIOGRAM COMPLETE
Height: 67 in
Weight: 3763.69 oz

## 2019-10-21 LAB — TROPONIN I (HIGH SENSITIVITY)
Troponin I (High Sensitivity): 22 ng/L — ABNORMAL HIGH (ref <18)
Troponin I (High Sensitivity): 20 ng/L — ABNORMAL HIGH (ref <18)

## 2019-10-21 MED ORDER — FUROSEMIDE 10 MG/ML IJ SOLN
60.0000 mg | Freq: Two times a day (BID) | INTRAMUSCULAR | Status: DC
  Administered 2019-10-21 – 2019-10-22 (×2): 60 mg via INTRAVENOUS
  Filled 2019-10-21 (×2): qty 6

## 2019-10-21 MED ORDER — ACETAMINOPHEN 650 MG RE SUPP: 650.0000 mg | Freq: Four times a day (QID) | RECTAL | Status: DC | PRN

## 2019-10-21 MED ORDER — SACUBITRIL-VALSARTAN 97-103 MG PO TABS
1.0000 | ORAL_TABLET | Freq: Two times a day (BID) | ORAL | Status: DC
  Administered 2019-10-21 – 2019-10-24 (×7): 1 via ORAL
  Filled 2019-10-21 (×8): qty 1

## 2019-10-21 MED ORDER — INSULIN ASPART 100 UNIT/ML ~~LOC~~ SOLN
0.0000 [IU] | Freq: Three times a day (TID) | SUBCUTANEOUS | Status: DC
  Administered 2019-10-21: 4 [IU] via SUBCUTANEOUS
  Administered 2019-10-21 – 2019-10-22 (×3): 3 [IU] via SUBCUTANEOUS
  Administered 2019-10-22: 4 [IU] via SUBCUTANEOUS
  Administered 2019-10-22: 3 [IU] via SUBCUTANEOUS
  Administered 2019-10-23: 4 [IU] via SUBCUTANEOUS
  Administered 2019-10-23: 7 [IU] via SUBCUTANEOUS
  Administered 2019-10-23: 11 [IU] via SUBCUTANEOUS
  Administered 2019-10-24 (×2): 3 [IU] via SUBCUTANEOUS

## 2019-10-21 MED ORDER — ASPIRIN EC 81 MG PO TBEC
81.0000 mg | DELAYED_RELEASE_TABLET | Freq: Every day | ORAL | Status: DC
  Administered 2019-10-21 – 2019-10-24 (×4): 81 mg via ORAL
  Filled 2019-10-21 (×4): qty 1

## 2019-10-21 MED ORDER — AMLODIPINE BESYLATE 10 MG PO TABS
10.0000 mg | ORAL_TABLET | Freq: Every day | ORAL | Status: DC
  Administered 2019-10-21 – 2019-10-24 (×4): 10 mg via ORAL
  Filled 2019-10-21 (×4): qty 1

## 2019-10-21 MED ORDER — INSULIN ASPART 100 UNIT/ML ~~LOC~~ SOLN
0.0000 [IU] | Freq: Every day | SUBCUTANEOUS | Status: DC
  Administered 2019-10-21: 2 [IU] via SUBCUTANEOUS
  Administered 2019-10-22: 3 [IU] via SUBCUTANEOUS

## 2019-10-21 MED ORDER — SODIUM CHLORIDE 0.9% FLUSH
3.0000 mL | Freq: Two times a day (BID) | INTRAVENOUS | Status: DC
  Administered 2019-10-21 – 2019-10-24 (×8): 3 mL via INTRAVENOUS

## 2019-10-21 MED ORDER — ACETAMINOPHEN 325 MG PO TABS
650.0000 mg | ORAL_TABLET | Freq: Four times a day (QID) | ORAL | Status: DC | PRN
  Administered 2019-10-23: 650 mg via ORAL
  Filled 2019-10-21: qty 2

## 2019-10-21 MED ORDER — PERFLUTREN LIPID MICROSPHERE
1.0000 mL | INTRAVENOUS | Status: AC | PRN
  Administered 2019-10-21: 2 mL via INTRAVENOUS
  Filled 2019-10-21: qty 10

## 2019-10-21 MED ORDER — INSULIN ASPART 100 UNIT/ML ~~LOC~~ SOLN
10.0000 [IU] | Freq: Three times a day (TID) | SUBCUTANEOUS | Status: DC
  Administered 2019-10-21 – 2019-10-24 (×11): 10 [IU] via SUBCUTANEOUS

## 2019-10-21 MED ORDER — FUROSEMIDE 10 MG/ML IJ SOLN: 40.0000 mg | Freq: Two times a day (BID) | INTRAMUSCULAR | Status: DC

## 2019-10-21 MED ORDER — SENNOSIDES-DOCUSATE SODIUM 8.6-50 MG PO TABS: 1.0000 | ORAL_TABLET | Freq: Every evening | ORAL | Status: DC | PRN

## 2019-10-21 MED ORDER — INSULIN GLARGINE 100 UNIT/ML ~~LOC~~ SOLN
30.0000 [IU] | Freq: Every day | SUBCUTANEOUS | Status: DC
  Administered 2019-10-22 – 2019-10-23 (×2): 30 [IU] via SUBCUTANEOUS
  Filled 2019-10-21 (×3): qty 0.3

## 2019-10-21 MED ORDER — ENOXAPARIN SODIUM 40 MG/0.4ML ~~LOC~~ SOLN
40.0000 mg | SUBCUTANEOUS | Status: DC
  Administered 2019-10-22 – 2019-10-23 (×3): 40 mg via SUBCUTANEOUS
  Filled 2019-10-21 (×3): qty 0.4

## 2019-10-21 MED ORDER — SACUBITRIL-VALSARTAN 97-103 MG PO TABS: 1.0000 | ORAL_TABLET | Freq: Two times a day (BID) | ORAL | Status: DC

## 2019-10-21 MED ORDER — FUROSEMIDE 10 MG/ML IJ SOLN
40.0000 mg | Freq: Two times a day (BID) | INTRAMUSCULAR | Status: DC
  Administered 2019-10-21: 40 mg via INTRAVENOUS
  Filled 2019-10-21: qty 4

## 2019-10-21 MED ORDER — ATORVASTATIN CALCIUM 40 MG PO TABS
40.0000 mg | ORAL_TABLET | Freq: Every day | ORAL | Status: DC
  Administered 2019-10-21 – 2019-10-24 (×4): 40 mg via ORAL
  Filled 2019-10-21 (×4): qty 1
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
## 2019-10-21 NOTE — Progress Notes (Signed)
  Pt is being active in her room,  Ambulates to bedside commode, chair and bed.  Pt is currently at 96% oxygen saturation on 2L [place]. Pt is encouraged to call if needs assistance.  Call bell within reach and bed alarm on.

## 2019-10-21 NOTE — Progress Notes (Signed)
   Subjective: HD#0   Overnight: Admitted   Today, Alicia [name] states that she feels much better since admission and denies chest discomfort or shortness of breath.  She did admit to dietary indiscretion with the recent Thanksgiving holidays.  She has not been ambulating in the room and she was encouraged to do so.  Objective:  Vital signs in last 24 hours: Vitals:   10/21/19 0200 10/21/19 0420 10/21/19 0427 10/21/19 0427  BP: 115/72  (!) 166/77 (!) 166/77  Pulse: (!) 59  (!) 56 (!) 56  Resp: 20  20 20   Temp:   99 F (37.2 C) 99 F (37.2 C)  TempSrc:   Oral Oral  SpO2: 100%  92% 93%  Weight:  106.7 kg    Height:  5\' 7"  (1.702 m)     Const: In no apparent distress, lying comfortably in bed, conversational Resp: Bibasilar crackles otherwise no wheezes or rhonchi CV: RRR, no murmurs, gallop, rub  Assessment/Plan:  Active Problems:   CHF exacerbation (HCC)  Alicia [name] is a 63 year old female with past medical history significant for HFrEF (E 45-50% 06/2018) on 3L supplemental O2 at baseline, T2DM, CAD, HTN, HLD, GERD who presented to the ED increased fatigue, shortness of breath, and lower extremity edema that is progressed over the past week and a half.  This is in the context of dietary indiscretion on Thanksgiving day.  Her presentation is most consistent with an exacerbation of her underlying HFrEF.  #HFrEF exacerbation (EF 45-50% 06/2018):  Clinically, she states that she feels much better this morning and denies subsequent chest pain or dyspnea.  O2 saturation of 96% on 2 L nasal cannula.  Recorded weight is surprisingly uptrending 106kg << 101kg though unsure how accurate this is could be her bed weight.  I have placed an order and advised for upstanding weight (dry weight (98.5 kg).  Total urine output is about 1.1 L with total net -910 cc. -Increase Lasix to IV 60mg  BID -Follow-up echocardiogram -Strict I's and O's, daily weights -Telemetry -Daily BMP -Advised to  transfer to bedside recliner and ambulate as much as she can   #Mild Metabolic alkalosis  Unsure if this is compensation from undiagnosed OSA but seems Bicarb is chronically elevated with normal renal function.  -Will require PFTs outpatient -Continue diuresis   #T2DM:  -Lantus 30U nightly -NovoLog 10U 3 times daily with meals -SSI   #CAD #HTN #HLD -Amlodipine 10 mg daily -Entresto (sacubitril-valsartan 97-103 mg) 1 tablet twice daily -Atorvastatin 40 mg daily   #FEN/GI: -Diet: Heart healthy/carb modified -Fluids: None   #DVT prophylaxis -Lovenox 40 mg subq injections daily   #CODE STATUS: FULL   #Dispo: Admit patient to Inpatient with expected length of stay greater than 2 midnights.  Jean Rosenthal, MD 10/21/2019, 7:06 AM Pager: [phone] Internal Medicine Teaching Service

## 2019-10-21 NOTE — Progress Notes (Signed)
  Echocardiogram 2D Echocardiogram has been performed.  Merrie Roof F 10/21/2019, 1:45 PM

## 2019-10-21 NOTE — Progress Notes (Signed)
   10/21/19 1200  Mobility  Activity Transferred:  Bed to chair  Range of Motion Passive;All extremities  Level of Assistance Minimal assist, patient does 75% or more  Mobility Response Tolerated fair

## 2019-10-22 DIAGNOSIS — I5033 Acute on chronic diastolic (congestive) heart failure: Secondary | ICD-10-CM

## 2019-10-22 LAB — CBC
HCT: 42.2 % (ref 36.0–46.0)
Hemoglobin: 12.8 g/dL (ref 12.0–15.0)
MCH: 26.7 pg (ref 26.0–34.0)
MCHC: 30.3 g/dL (ref 30.0–36.0)
MCV: 88.1 fL (ref 80.0–100.0)
Platelets: 270 10*3/uL (ref 150–400)
RBC: 4.79 MIL/uL (ref 3.87–5.11)
RDW: 17.5 % — ABNORMAL HIGH (ref 11.5–15.5)
WBC: 6.3 10*3/uL (ref 4.0–10.5)
nRBC: 0.3 % — ABNORMAL HIGH (ref 0.0–0.2)

## 2019-10-22 LAB — BASIC METABOLIC PANEL
Anion gap: 12 (ref 5–15)
BUN: 13 mg/dL (ref 8–23)
CO2: 33 mmol/L — ABNORMAL HIGH (ref 22–32)
Calcium: 8.3 mg/dL — ABNORMAL LOW (ref 8.9–10.3)
Chloride: 93 mmol/L — ABNORMAL LOW (ref 98–111)
Creatinine, Ser: 0.68 mg/dL (ref 0.44–1.00)
GFR calc Af Amer: >60 mL/min (ref >60)
GFR calc non Af Amer: >60 mL/min (ref >60)
Glucose, Bld: 160 mg/dL — ABNORMAL HIGH (ref 70–99)
Potassium: 3.9 mmol/L (ref 3.5–5.1)
Sodium: 138 mmol/L (ref 135–145)

## 2019-10-22 LAB — GLUCOSE, CAPILLARY
Glucose-Capillary: 152 mg/dL — ABNORMAL HIGH (ref 70–99)
Glucose-Capillary: 140 mg/dL — ABNORMAL HIGH (ref 70–99)
Glucose-Capillary: 137 mg/dL — ABNORMAL HIGH (ref 70–99)
Glucose-Capillary: 281 mg/dL — ABNORMAL HIGH (ref 70–99)

## 2019-10-22 LAB — MAGNESIUM: Magnesium: 1.5 mg/dL — ABNORMAL LOW (ref 1.7–2.4)

## 2019-10-22 MED ORDER — FUROSEMIDE 10 MG/ML IJ SOLN
80.0000 mg | Freq: Two times a day (BID) | INTRAMUSCULAR | Status: DC
  Administered 2019-10-22 – 2019-10-24 (×4): 80 mg via INTRAVENOUS
  Filled 2019-10-22 (×4): qty 8

## 2019-10-22 MED ORDER — ONDANSETRON HCL 4 MG PO TABS: 4.0000 mg | ORAL_TABLET | Freq: Four times a day (QID) | ORAL | Status: DC | PRN

## 2019-10-22 MED ORDER — ALUM & MAG HYDROXIDE-SIMETH 200-200-20 MG/5ML PO SUSP
30.0000 mL | ORAL | Status: DC | PRN
  Administered 2019-10-22: 30 mL via ORAL
  Filled 2019-10-22 (×2): qty 30

## 2019-10-22 MED ORDER — ONDANSETRON HCL 4 MG/2ML IJ SOLN
4.0000 mg | Freq: Four times a day (QID) | INTRAMUSCULAR | Status: DC | PRN
  Administered 2019-10-22: 4 mg via INTRAVENOUS
  Filled 2019-10-22: qty 2

## 2019-10-22 NOTE — Progress Notes (Addendum)
Saw Alicia [name] at the bedside after getting paged that she was having left upper quadrant 7/10 abdominal pain and nausea.  When I entered the room, the patient was sitting up at the bedside drinking a dose of Maalox.  The patient appeared comfortable. I asked the patient what was going on, and the patient stated that she has been having this abdominal pain and nausea since she ate cabbage earlier for dinner. She states that she always gets nauseous and has abdominal pain when she eats cabbage at home. She is not sure why she decided to eat it here.  She states that she already feels a little bit better after taking some Maalox. Pain now is 5/10 and feels "a little queasy". Vital signs are stable. On abdominal exam, normal bowel sounds were present. Patient had no tenderness to light or deep palpation in all quadrants of the abdomen. No rashes or lesions were found on the skin. I told the patient we ordered Zofran as needed if she continues to feel nauseous.   [name], MD Internal Medicine, PGY1 Pager: [phone]  10/22/2019,9:09 PM

## 2019-10-22 NOTE — Progress Notes (Signed)
   Subjective: HD#1   Overnight: No acute events reported  Today, Alicia [name] was examined and reports that she feels much better since admission.  She is able to sit in the bedside recliner without having any difficulties in breathing.  She does report of chronic orthopnea which is unchanged.  She believes that her lower extremity edema has also improved.  Objective:  Vital signs in last 24 hours: Vitals:   10/21/19 2204 10/22/19 0007 10/22/19 0344 10/22/19 0348  BP: (!) 166/75 (!) 169/71 (!) 152/75   Pulse:  67 63   Resp:  18 18   Temp:  99.2 F (37.3 C) 99.3 F (37.4 C)   TempSrc:  Oral Oral   SpO2:  96% 93%   Weight:    106.5 kg  Height:       Const: In no apparent distress, lying comfortably in bed, conversational Resp: Mild to moderate bibasilar crackles CV: RRR, no murmurs, gallop, rub Ext: 2+ bilateral lower extremity pitting edema up to the patella.  Assessment/Plan:  Principal Problem:   Acute on chronic HFrEF (heart failure with reduced ejection fraction) (HCC) Active Problems:   Type 2 diabetes mellitus with moderate nonproliferative diabetic retinopathy ([place])   Essential hypertension   Chest pain  Alicia [name] is a 63 year old female with past medical history significant for HFrEF(E 45-50% 06/2018) on 3L supplemental O2 at baseline,T2DM,CAD,HTN, HLD, GERD who presented to the ED increased fatigue, shortness of breath, and lower extremity edema that is progressed over the past week and a half. This is in the context of dietary indiscretion on Thanksgiving day. Her presentation is most consistent with an exacerbation of her underlying HFrEF.  #HFrEF exacerbation (EF 45-50% 06/2018):  Surprisingly she is not diuresing well despite increasing Lasix to 60 mg.  Her weight has remained relatively stable at 106.5 kg<<106.7 kg.  She still has 2+ bilateral lower extremity pitting edema with mild to moderate bibasilar crackles.  Echocardiogram reviewed improved LVEF  of 55 to 60% from 45-50% in August 2019. -Increase Lasix to IV 80mg  BID -Strict I's and O's,daily weights -Telemetry -Daily BMP   #T2DM: -Lantus 30U nightly -NovoLog 10U3 times daily with meals -SSI   #CAD #HTN #HLD -Amlodipine 10 mg daily -Entresto(sacubitril-valsartan 97-103 mg)1 tablet twice daily -Atorvastatin 40 mg daily   #FEN/GI: -Diet: Heart healthy/carb modified -Fluids: None   #DVT prophylaxis -Lovenox 40 mg subqinjections daily   #Mild Metabolic alkalosis  Unsure if this is compensation from undiagnosed OSA but seems Bicarb is chronically elevated with normal renal function.  -Will require PFTs outpatient -Continue diuresis  #CODE STATUS: FULL   #Dispo: Admit patient toInpatient with expected length of stay greater than 2 midnights.  Alicia Rosenthal, MD 10/22/2019, 6:21 AM Pager: [phone] Internal Medicine Teaching Service

## 2019-10-22 NOTE — Plan of Care (Signed)
  Problem: Activity: Goal: Capacity to carry out activities will improve Outcome: Progressing   Problem: Cardiac: Goal: Ability to achieve and maintain adequate cardiopulmonary perfusion will improve Outcome: Progressing   

## 2019-10-22 NOTE — Progress Notes (Signed)
MD on call paged that cardiac monitoring will expire in 1 hr. Arthor Captain LPN

## 2019-10-22 NOTE — Progress Notes (Signed)
Md on call paged paitient c/o nausea Maalox from PRN ordered but need something for nausea. Arthor Captain LPN

## 2019-10-23 DIAGNOSIS — Z794 Long term (current) use of insulin: Secondary | ICD-10-CM

## 2019-10-23 DIAGNOSIS — Z9111 Patient's noncompliance with dietary regimen: Secondary | ICD-10-CM

## 2019-10-23 DIAGNOSIS — E873 Alkalosis: Secondary | ICD-10-CM

## 2019-10-23 DIAGNOSIS — Z9981 Dependence on supplemental oxygen: Secondary | ICD-10-CM

## 2019-10-23 DIAGNOSIS — I110 Hypertensive heart disease with heart failure: Principal | ICD-10-CM

## 2019-10-23 DIAGNOSIS — E785 Hyperlipidemia, unspecified: Secondary | ICD-10-CM

## 2019-10-23 DIAGNOSIS — E119 Type 2 diabetes mellitus without complications: Secondary | ICD-10-CM

## 2019-10-23 DIAGNOSIS — K219 Gastro-esophageal reflux disease without esophagitis: Secondary | ICD-10-CM

## 2019-10-23 DIAGNOSIS — I2510 Atherosclerotic heart disease of native coronary artery without angina pectoris: Secondary | ICD-10-CM

## 2019-10-23 DIAGNOSIS — I5023 Acute on chronic systolic (congestive) heart failure: Secondary | ICD-10-CM

## 2019-10-23 DIAGNOSIS — Z79899 Other long term (current) drug therapy: Secondary | ICD-10-CM

## 2019-10-23 LAB — BASIC METABOLIC PANEL
Anion gap: 9 (ref 5–15)
BUN: 18 mg/dL (ref 8–23)
CO2: 35 mmol/L — ABNORMAL HIGH (ref 22–32)
Calcium: 8.2 mg/dL — ABNORMAL LOW (ref 8.9–10.3)
Chloride: 89 mmol/L — ABNORMAL LOW (ref 98–111)
Creatinine, Ser: 0.91 mg/dL (ref 0.44–1.00)
GFR calc Af Amer: >60 mL/min (ref >60)
GFR calc non Af Amer: >60 mL/min (ref >60)
Glucose, Bld: 238 mg/dL — ABNORMAL HIGH (ref 70–99)
Potassium: 4.2 mmol/L (ref 3.5–5.1)
Sodium: 133 mmol/L — ABNORMAL LOW (ref 135–145)

## 2019-10-23 LAB — GLUCOSE, CAPILLARY
Glucose-Capillary: 251 mg/dL — ABNORMAL HIGH (ref 70–99)
Glucose-Capillary: 226 mg/dL — ABNORMAL HIGH (ref 70–99)
Glucose-Capillary: 179 mg/dL — ABNORMAL HIGH (ref 70–99)
Glucose-Capillary: 206 mg/dL — ABNORMAL HIGH (ref 70–99)
Glucose-Capillary: 198 mg/dL — ABNORMAL HIGH (ref 70–99)

## 2019-10-23 LAB — MAGNESIUM
Magnesium: 1.6 mg/dL — ABNORMAL LOW (ref 1.7–2.4)
Magnesium: 2 mg/dL (ref 1.7–2.4)

## 2019-10-23 MED ORDER — MAGNESIUM SULFATE 2 GM/50ML IV SOLN
2.0000 g | Freq: Once | INTRAVENOUS | Status: AC
  Administered 2019-10-23: 09:00:00 2 g via INTRAVENOUS
  Filled 2019-10-23: qty 50

## 2019-10-23 NOTE — Progress Notes (Signed)
Internal Medicine Attending Note:  I have seen and evaluated this patient and I have discussed the plan of care with the house staff. Please see their note for complete details. I concur with their findings.  Velna Ochs, MD 10/23/2019, 1:20 PM

## 2019-10-23 NOTE — Plan of Care (Signed)
  Problem: Activity: Goal: Capacity to carry out activities will improve Outcome: Progressing   Problem: Cardiac: Goal: Ability to achieve and maintain adequate cardiopulmonary perfusion will improve Outcome: Progressing   

## 2019-10-23 NOTE — Progress Notes (Signed)
Patient with intermittent low grade fever over the last 12 hours. Last temperature at noon was 100.6. Dr. Ronnald Ramp notified. Will give PO tylenol per orders. Will continue to monitor. - Soyla Dryer RN

## 2019-10-23 NOTE — Progress Notes (Signed)
   Subjective: Pt seen at the bedside this morning. Had some abdominal pain and nausea last evening after eating cabbage, which has since resolved. States she is feeling better than on presentation with improved breathing and LE edema. Endorses some cough and sputum production which is unchanged from baseline. Denies dysuria, abdominal pain, or congestion. No acute concerns at this time.  Objective:  Vital signs in last 24 hours: Vitals:   10/22/19 2040 10/23/19 0143 10/23/19 0434 10/23/19 0447  BP: (!) 146/87 (!) 156/65  (!) 144/69  Pulse: 70 64 62 62  Resp: 20 20  20   Temp: 100 F (37.8 C) 98.1 F (36.7 C)  (!) 100.6 F (38.1 C)  TempSrc: Oral Oral  Oral  SpO2: 95% 91% 94% 94%  Weight:      Height:       Physical Exam Vitals signs and nursing note reviewed.  Constitutional:      General: She is not in acute distress.    Appearance: She is not ill-appearing.     Comments: Pt up and sitting in the chair  Pulmonary:     Effort: Pulmonary effort is normal.     Comments: On 2L [place]. Mild bibasilar crackles on ausculation.  Musculoskeletal:     Right lower leg: Edema present.     Left lower leg: Edema present.     Comments: +2 bilateral lower extremity pitting edema to the knee  Skin:    General: Skin is warm and dry.  Neurological:     Mental Status: She is alert.    Assessment/Plan:  Principal Problem:   Acute on chronic heart failure with preserved ejection fraction (HFpEF) (HCC) Active Problems:   Type 2 diabetes mellitus with moderate nonproliferative diabetic retinopathy ([place])   Essential hypertension   Chest pain   Ms. Donlon is a 63 year old F with significant PMH of HFrEF(EF 45-50% 06/2018) on 2-3L supplemental O2 at baseline,T2DM,CAD,HTN, HLD, GERD who presented with progressive shortness of breath and lower extremity edema after dietary indiscretion on Thanksgiving day, consistent with an exacerbation of her underlying HFrEF.   HFrEF exacerbation (EF 45-50%  06/2018) Echocardiogram reviewed improved LVEF of 55 to 60% from 45-50% in August 2019. Weight remain stable from admission at 106kg (5kg up from dry weight).  - net negative 2.3L yesterday - continue furosemide 80mg  IV BID - Cr stable at 0.9 - strict I/O's,daily standing weights - telemetry - monitor electrolytes with daily BMP  Mg low at 1.5 yesterday - will replete with 2g IV Mg and recheck this afternoon   T2DM: Most recent A1c 9.5 on 09/04/2019 - continue lantus 30u qhs and novolog 10u TID with meals - SSI   CAD/HTN/HLD - continue home amlodipine 10 mg daily, entresto(sacubitril-valsartan 97-103 mg)1 tablet BID, and atorvastatin 40 mg daily   Mild Metabolic alkalosis ?compensation from undiagnosed OSA, as bicarb seems chronically elevated. Bicarb 35 this morning. - will require PFTs outpatient - continue diuresis   Diet: heart healthy/carb modified Fluids: none DVT prophylaxis: enoxaparin 40mg  daily CODE STATUS: FULL CODE   Dispo: Anticipated discharge pending optimization of her diuresis, likely 2-3 day(s).    Ladona Horns, MD 10/23/2019, 6:39 AM Pager: [phone]

## 2019-10-23 NOTE — Progress Notes (Signed)
Inpatient Diabetes Program Recommendations  AACE/ADA: New Consensus Statement on Inpatient Glycemic Control (2015)  Target Ranges:  Prepandial:   less than 140 mg/dL      Peak postprandial:   less than 180 mg/dL (1-2 hours)      Critically ill patients:  140 - 180 mg/dL   Lab Results  Component Value Date   GLUCAP 206 (H) 10/23/2019   HGBA1C 9.5 (A) 09/04/2019    Review of Glycemic Control  Diabetes history: DM2 Outpatient Diabetes medications: Lantus 50 units QHS, Humalog 20 units tidwc, metformin 123XX123 mg bid, Trulicity 1.5 mg weekly (not taking) Current orders for Inpatient glycemic control: Lantus 30 units QHS, Novolog 0-20 units tidwc and 0-5 units QHS + 10 units tidwc  HgbA1C - 9.5%  Inpatient Diabetes Program Recommendations:     Increase Lantus to 40 units QHS Increase Novolog to 15 units tidwc  Will follow trends.  Thank you. Lorenda Peck, RD, LDN, CDE Inpatient Diabetes Coordinator [phone]

## 2019-10-23 NOTE — Progress Notes (Signed)
NAME:  Alicia [name], MRN:  [id number], DOB:  01/13/1956, LOS: 2 ADMISSION DATE:  10/20/2019, Primary: Axel Filler, MD  CHIEF COMPLAINT:  dyspnea   Brief History  63 yo female with PMH of HFrEF (EF 45-50% in 2019), ischemic heart disease, hypertension, CAD, hyperlipidemia, and type II DM who presented on 11/27 for progressive shortness of breath and chest pain and found to have acute on chronic HF exacerbation likely 2/2 dietary indiscretions. ACS ruled out. Dry weight approx 101kg. 106kg on admission. Appeared fluid overloaded. BNP 430 on admission. Poor response on 40mg  IV lasix with improved diuresis when increased to 80mg .  Subjective  Febrile yesterday to Tmax of 100.6. remainder VSS.  Less UOP yesterday. -3.5L total since admission. No overnight events. Patient noting improvement in symptoms  Objective   Blood pressure 136/71, pulse (!) 56, temperature 98.6 F (37 C), temperature source Oral, resp. rate 20, height 5\' 7"  (1.702 m), weight 106.2 kg, SpO2 97 %.     Intake/Output Summary (Last 24 hours) at 10/23/2019 2130 Last data filed at 10/23/2019 1844 Gross per 24 hour  Intake 1130 ml  Output 1250 ml  Net -120 ml   Filed Weights   10/21/19 0420 10/22/19 0348 10/23/19 0447  Weight: 106.7 kg 106.5 kg 106.2 kg    Examination: GENERAL: in no acute distress CARDIAC: RRR. +2 pitting edema to bilateral lower extremities. No JVD PULMONARY: bibasilar crackles greater on right. Breathing on 2L [place]  Significant Diagnostic Tests:  11/27 CXR: hazy interstitial opacities in bilateral lung fields with some septal and fissural thickening. Enlarged cardiac silhouette. 11/28 echo: LVEF 55-60% with normal function. Moderate LVH. Grade 2 diastolic dysfunction of LV. RV mildly enlarged with normal function. Mild biatrial dilation. Trivial pericardial effusion. No significant valvular dysfunctions.   Labs    CBC Latest Ref Rng & Units 10/22/2019 10/20/2019 08/15/2019  WBC 4.0  - 10.5 K/uL 6.3 7.9 6.5  Hemoglobin 12.0 - 15.0 g/dL 12.8 12.9 13.5  Hematocrit 36.0 - 46.0 % 42.2 42.7 42.5  Platelets 150 - 400 K/uL 270 316 258   BMP Latest Ref Rng & Units 10/23/2019 10/22/2019 10/21/2019  Glucose 70 - 99 mg/dL 238(H) 160(H) 167(H)  BUN 8 - 23 mg/dL 18 13 11   Creatinine 0.44 - 1.00 mg/dL 0.91 0.68 0.80  BUN/Creat Ratio 12 - 28 - - -  Sodium 135 - 145 mmol/L 133(L) 138 138  Potassium 3.5 - 5.1 mmol/L 4.2 3.9 3.6  Chloride 98 - 111 mmol/L 89(L) 93(L) 94(L)  CO2 22 - 32 mmol/L 35(H) 33(H) 33(H)  Calcium 8.9 - 10.3 mg/dL 8.2(L) 8.3(L) 8.7(L)    Summary  63 yo female with history of HFrEF (EF 45-50% in 2019) who presented on 11/27 for progressive shortness of breath and chest pain.  Assessment & Plan:  Principal Problem:   Acute on chronic heart failure with preserved ejection fraction (HFpEF) (HCC) Active Problems:   Type 2 diabetes mellitus with moderate nonproliferative diabetic retinopathy ([place])   Essential hypertension   Chest pain  HFrEF exacerbation. Likely related to dietary indiscretions. 2019 echo showed EF of 45-50%. Repeat echo on admission showed improvement in EF to 55-60%. Dry weight approx 101kg. 106kg on admission. BNP 430 on admission. No significant weight changes throuhout hospitalization.  UOP decreased yesterday. Patient noting symptomatic improvement.  Chest pain. ACS ruled out diagnositically. Likely related to exacerbation. Acute Chronic respiratory failure. Has successfully returned to baseline of 2L supplemental oxygen. Plan Transition to 60mg  lasix po bid  Strict I/Os. Daily weights. Tele monitoring  CAD/HTN/HLD. Continue home amlodipine, entresto and atrovastatin.  DM type II. A1C 9.5 in October. Continue lantus and novolog with SSI  Chronic respiratory failure with chronic 2-3L of supplemental oxygen use. Etiology unclear however most likely 2/2 undiagnosed OSA superimposed on heart failure. Bicarb chronically elevated. Plan: Will  need PFTs at discharge.   B est practice:  CODE STATUS: Full Diet: heart healthy, diabetic DVT for prophylaxis: lovenox Dispo: discharging today   Mitzi Hansen, MD [place] PGY-1 PAGER #: [phone] 10/23/19 9:30 PM

## 2019-10-24 DIAGNOSIS — J189 Pneumonia, unspecified organism: Secondary | ICD-10-CM

## 2019-10-24 LAB — CBC
HCT: 40.2 % (ref 36.0–46.0)
Hemoglobin: 12.3 g/dL (ref 12.0–15.0)
MCH: 26.7 pg (ref 26.0–34.0)
MCHC: 30.6 g/dL (ref 30.0–36.0)
MCV: 87.4 fL (ref 80.0–100.0)
Platelets: 288 10*3/uL (ref 150–400)
RBC: 4.6 MIL/uL (ref 3.87–5.11)
RDW: 16.8 % — ABNORMAL HIGH (ref 11.5–15.5)
WBC: 5.9 10*3/uL (ref 4.0–10.5)
nRBC: 0 % (ref 0.0–0.2)

## 2019-10-24 LAB — BASIC METABOLIC PANEL
Anion gap: 9 (ref 5–15)
BUN: 22 mg/dL (ref 8–23)
CO2: 34 mmol/L — ABNORMAL HIGH (ref 22–32)
Calcium: 8.4 mg/dL — ABNORMAL LOW (ref 8.9–10.3)
Chloride: 89 mmol/L — ABNORMAL LOW (ref 98–111)
Creatinine, Ser: 0.9 mg/dL (ref 0.44–1.00)
GFR calc Af Amer: >60 mL/min (ref >60)
GFR calc non Af Amer: >60 mL/min (ref >60)
Glucose, Bld: 136 mg/dL — ABNORMAL HIGH (ref 70–99)
Potassium: 4 mmol/L (ref 3.5–5.1)
Sodium: 132 mmol/L — ABNORMAL LOW (ref 135–145)

## 2019-10-24 LAB — GLUCOSE, CAPILLARY
Glucose-Capillary: 134 mg/dL — ABNORMAL HIGH (ref 70–99)
Glucose-Capillary: 107 mg/dL — ABNORMAL HIGH (ref 70–99)
Glucose-Capillary: 132 mg/dL — ABNORMAL HIGH (ref 70–99)

## 2019-10-24 MED ORDER — AZITHROMYCIN 500 MG PO TABS
500.0000 mg | ORAL_TABLET | Freq: Every day | ORAL | Status: DC
  Administered 2019-10-24: 500 mg via ORAL
  Filled 2019-10-24: qty 1

## 2019-10-24 MED ORDER — AZITHROMYCIN 500 MG PO TABS: 500.0000 mg | ORAL_TABLET | Freq: Every day | ORAL | 0 refills | Status: DC

## 2019-10-24 MED ORDER — FUROSEMIDE 40 MG PO TABS: 60.0000 mg | ORAL_TABLET | Freq: Two times a day (BID) | ORAL | 3 refills | Status: DC

## 2019-10-24 MED ORDER — CEFDINIR 300 MG PO CAPS: 300.0000 mg | ORAL_CAPSULE | Freq: Two times a day (BID) | ORAL | Status: DC

## 2019-10-24 NOTE — Discharge Summary (Addendum)
Name: Alicia [name] MRN: [id number] DOB: 20-Apr-1956 63 y.o. PCP: Axel Filler, MD  Date of Admission: 10/20/2019  7:51 PM Date of Discharge: 10/24/2019 Attending Physician: Lalla Brothers, MD  Discharge Diagnosis: Principal Problem:   Acute on chronic heart failure with preserved ejection fraction (HFpEF) ([place]) Active Problems:   Type 2 diabetes mellitus with moderate nonproliferative diabetic retinopathy ([place])   Essential hypertension   Chest pain   Discharge Medications: Allergies as of 10/24/2019       Reactions   Beta Adrenergic Blockers Other (See Comments)   Sinus pauses   Canagliflozin Itching, Anxiety, Palpitations        Medication List     STOP taking these medications    Dulaglutide 1.5 MG/0.5ML Sopn Commonly known as: Trulicity       TAKE these medications    Accu-Chek Aviva Plus test strip Generic drug: glucose blood USE TO TEST BLOOD SUGARS AS DIRECTED   accu-chek multiclix lancets Use as instructed   amLODipine 10 MG tablet Commonly known as: NORVASC Take 1 tablet (10 mg total) by mouth daily.   aspirin 81 MG tablet Take 81 mg by mouth daily.   atorvastatin 40 MG tablet Commonly known as: LIPITOR Take 1 tablet (40 mg total) by mouth daily.   azithromycin 500 MG tablet Commonly known as: ZITHROMAX Take 1 tablet (500 mg total) by mouth daily. Start taking on: October 25, 2019   furosemide 40 MG tablet Commonly known as: LASIX Take 1.5 tablets (60 mg total) by mouth 2 (two) times daily. What changed: how much to take   insulin lispro 100 UNIT/ML KwikPen Commonly known as: HumaLOG KwikPen Inject 0.2 mLs (20 Units total) into the skin 3 (three) times daily with meals   Insulin Pen Needle 32G X 4 MM Misc Use to inject insulin 4 times a day. The patient is insulin requiring, ICD 10 code 11.10. The patient injects 4 times per day.   Insulin Syringe-Needle U-100 31G X 15/64" 0.3 ML Misc Use to inject Humalog before meals  three times a day   Lantus SoloStar 100 UNIT/ML Solostar Pen Generic drug: Insulin Glargine INJECT 50 UNITS INTO THE SKIN EVERY DAY What changed: See the new instructions.   metFORMIN 500 MG 24 hr tablet Commonly known as: GLUCOPHAGE-XR Take 1,000 mg by mouth 2 (two) times daily.   sacubitril-valsartan 97-103 MG Commonly known as: ENTRESTO Take 1 tablet by mouth 2 (two) times daily.   triamcinolone 0.025 % cream Commonly known as: KENALOG Apply 1 application topically 2 (two) times daily.        Disposition and follow-up:   [name] was discharged from [facility] in Stable condition.  At the hospital follow up visit please address:  1.  HFrEF exacerbation. Likely related to some dietary indiscretions prior to admission. She was previously on 40mg  lasix bid at home however did not respond well to 40mg  IV lasix bid during hospitalization and this had to be increased to 80mg  before she began to diurese well. PE at time of discharge was not overtly concerning for exacerbation. Home Lasix dose was increased to 60mg  po on day of discharge. Please evaluate for signs of fluid accumulation and adjust as needed.  2. CAP. Dyspnea initially thought to be primarily cardiac related however she did develop fevers with Tmax of 100.49F. CXR could suggest infectious process and she did exhibit some bibasilar crackles that were more significant on the right. CBC did not reveal a  leukocytosis. On day of discharge, she was started on 3d course of azithromycin 500mg  and 5d course of cefdinir 300mg  bid. Please re-evaluate at time post hospital visit.  2.  Labs / imaging needed at time of follow-up: CBC  Follow-up Appointments: PCP in [place] by problem list: 64 yo female with PMH of HFrEF (45-50% in 2019) (lasix 40mg  bid) who presented with progressive shortness of breath and chest pain that was initially attributed to a heart failure exacerbation due to some  dietary indiscretions occurring prior to admission. ACS ruled out diagnostically. CXR findings consistent with heart failure exacerbation. She was started on 40mg  IV lasix bid without significant diuresis until increased to 80mg . Net -3.5L during hospitalization without significant weight change. Repeat echo significant for improved EF of 55-60%. On day prior to discharge, she developed a fever with a Tmax of 100.7. PE findings on day of discharge revealed R>L bibasilar crackles. Given her other comorbidities and trajectory of hospitalization, she was subsequently started on azithromycin and cefdinir for treatment of CAP. Her symptoms and functional status had improved by time of discharge.  Discharge Vitals:   BP 133/67 (BP Location: Right Arm)   Pulse 66   Temp 99.7 F (37.6 C) (Oral)   Resp 18   Ht 5\' 7"  (1.702 m)   Wt 107 kg   LMP  (LMP Unknown)   SpO2 98%   BMI 36.93 kg/m   Pertinent Labs, Studies, and Procedures:  CXR: hazy interstitial opacities present with septal and fissural thickening. Cardiomegaly present.  Echocardiogram: LVEF 55-60% with moderate LVH. Grade II diastolic dysfunction of LV. Normal RV funtion. Mild biatrial enlargement. Trivial pericardial effusion. No significant valvular dysfuntion.IVC with >50% respiratory variability.  Discharge Instructions: Discharge Instructions     Diet - low sodium heart healthy   Complete by: As directed    Increase activity slowly   Complete by: As directed        Signed: Mitzi Hansen, MD 10/24/2019, 7:51 PM   Pager: [phone]

## 2019-11-02 ENCOUNTER — Ambulatory Visit (INDEPENDENT_AMBULATORY_CARE_PROVIDER_SITE_OTHER): Payer: Medicare Other | Admitting: Cardiology

## 2019-11-02 ENCOUNTER — Encounter: Payer: Self-pay | Admitting: Cardiology

## 2019-11-02 ENCOUNTER — Other Ambulatory Visit: Payer: Self-pay

## 2019-11-02 VITALS — BP 140/70 | HR 66 | Ht 67.0 in | Wt 232.4 lb

## 2019-11-02 DIAGNOSIS — I10 Essential (primary) hypertension: Secondary | ICD-10-CM

## 2019-11-02 DIAGNOSIS — R0602 Shortness of breath: Secondary | ICD-10-CM | POA: Diagnosis not present

## 2019-11-02 DIAGNOSIS — E7800 Pure hypercholesterolemia, unspecified: Secondary | ICD-10-CM | POA: Diagnosis not present

## 2019-11-02 DIAGNOSIS — I5042 Chronic combined systolic (congestive) and diastolic (congestive) heart failure: Secondary | ICD-10-CM | POA: Diagnosis not present
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
## 2019-11-02 NOTE — Patient Instructions (Signed)
Medication Instructions:  The current medical regimen is effective;  continue present plan and medications.  *If you need a refill on your cardiac medications before your next appointment, please call your pharmacy*  Follow-Up: At [facility], you and your health needs are our priority.  As part of our continuing mission to provide you with exceptional heart care, we have created designated Provider Care Teams.  These Care Teams include your primary Cardiologist (physician) and Advanced Practice Providers (APPs -  Physician Assistants and Nurse Practitioners) who all work together to provide you with the care you need, when you need it.  Your next appointment:   2 month(s)  The format for your next appointment:   In Person  Provider:   Truitt Merle, NP  Thank you for choosing [facility]!!

## 2019-11-20 ENCOUNTER — Telehealth: Payer: Self-pay

## 2019-11-20 NOTE — Telephone Encounter (Signed)
Received TC from pt: C/O swelling in Left leg and abdomen.  Denies any pain, denies any SOB.  States she's had a BM yesterday and this morning.  Pt is requesting an appt for evaluation.  Offered pt an appt in [facility] this afternoon, pt declined, requested appt tomorrow.  Appt made in [facility] tomorrow at [place]. SChaplin, RN,BSN

## 2019-11-21 ENCOUNTER — Ambulatory Visit (INDEPENDENT_AMBULATORY_CARE_PROVIDER_SITE_OTHER): Payer: Medicare Other | Admitting: Internal Medicine

## 2019-11-21 ENCOUNTER — Encounter: Payer: Self-pay | Admitting: Internal Medicine

## 2019-11-21 ENCOUNTER — Other Ambulatory Visit: Payer: Self-pay

## 2019-11-21 VITALS — BP 165/77 | HR 70 | Wt 237.5 lb

## 2019-11-21 DIAGNOSIS — Z79899 Other long term (current) drug therapy: Secondary | ICD-10-CM

## 2019-11-21 DIAGNOSIS — I5042 Chronic combined systolic (congestive) and diastolic (congestive) heart failure: Secondary | ICD-10-CM | POA: Diagnosis not present

## 2019-11-21 DIAGNOSIS — R590 Localized enlarged lymph nodes: Secondary | ICD-10-CM

## 2019-11-21 DIAGNOSIS — I5033 Acute on chronic diastolic (congestive) heart failure: Secondary | ICD-10-CM | POA: Diagnosis not present

## 2019-11-21 DIAGNOSIS — R599 Enlarged lymph nodes, unspecified: Secondary | ICD-10-CM | POA: Insufficient documentation

## 2019-11-21 LAB — CBC WITH DIFFERENTIAL/PLATELET
Abs Immature Granulocytes: 0.03 10*3/uL (ref 0.00–0.07)
Basophils Absolute: 0 10*3/uL (ref 0.0–0.1)
Basophils Relative: 1 %
Eosinophils Absolute: 0.1 10*3/uL (ref 0.0–0.5)
Eosinophils Relative: 2 %
HCT: 48.5 % — ABNORMAL HIGH (ref 36.0–46.0)
Hemoglobin: 14.1 g/dL (ref 12.0–15.0)
Immature Granulocytes: 1 %
Lymphocytes Relative: 15 %
Lymphs Abs: 1 10*3/uL (ref 0.7–4.0)
MCH: 26 pg (ref 26.0–34.0)
MCHC: 29.1 g/dL — ABNORMAL LOW (ref 30.0–36.0)
MCV: 89.5 fL (ref 80.0–100.0)
Monocytes Absolute: 0.5 10*3/uL (ref 0.1–1.0)
Monocytes Relative: 7 %
Neutro Abs: 4.7 10*3/uL (ref 1.7–7.7)
Neutrophils Relative %: 74 %
Platelets: 283 10*3/uL (ref 150–400)
RBC: 5.42 MIL/uL — ABNORMAL HIGH (ref 3.87–5.11)
RDW: 18.5 % — ABNORMAL HIGH (ref 11.5–15.5)
WBC: 6.3 10*3/uL (ref 4.0–10.5)
nRBC: 0.3 % — ABNORMAL HIGH (ref 0.0–0.2)

## 2019-11-21 LAB — BASIC METABOLIC PANEL
Anion gap: 11 (ref 5–15)
BUN: 8 mg/dL (ref 8–23)
CO2: 32 mmol/L (ref 22–32)
Calcium: 8.8 mg/dL — ABNORMAL LOW (ref 8.9–10.3)
Chloride: 95 mmol/L — ABNORMAL LOW (ref 98–111)
Creatinine, Ser: 0.7 mg/dL (ref 0.44–1.00)
GFR calc Af Amer: >60 mL/min (ref >60)
GFR calc non Af Amer: >60 mL/min (ref >60)
Glucose, Bld: 90 mg/dL (ref 70–99)
Potassium: 3.6 mmol/L (ref 3.5–5.1)
Sodium: 138 mmol/L (ref 135–145)

## 2019-11-21 MED ORDER — FUROSEMIDE 10 MG/ML IJ SOLN: 80.0000 mg | Freq: Once | INTRAMUSCULAR | 0 refills | Status: DC

## 2019-11-21 MED ORDER — FUROSEMIDE 10 MG/ML IJ SOLN: 80.0000 mg | Freq: Once | INTRAMUSCULAR | Status: DC

## 2019-11-21 MED ORDER — FUROSEMIDE 10 MG/ML IJ SOLN
80.0000 mg | Freq: Once | INTRAMUSCULAR | Status: AC
  Administered 2019-11-21: 80 mg via INTRAVENOUS

## 2019-11-21 MED ORDER — FUROSEMIDE 40 MG PO TABS: 40.0000 mg | ORAL_TABLET | Freq: Two times a day (BID) | ORAL | 1 refills | Status: DC

## 2019-11-21 NOTE — Progress Notes (Signed)
Internal Medicine Clinic Attending  I saw and evaluated the patient.  I personally confirmed the key portions of the history and exam documented by Dr. Berline Lopes and I reviewed pertinent patient test results.  The assessment, diagnosis, and plan were formulated together and I agree with the documentation in the resident's note.   Patient here with acute on chronic HFpEF. She is volume overloaded with left leg edema, pitting edema at the abdomen, and slightly elevated JVD at 4.5cm. She has NYHA class III symptoms, but seems to be doing fairly well at home in terms of function with the support of her daughter. On ultrasound, the left leg does not have a proximal DVT, there is an abnormal 2cm inguinal lymphnode, but no other sign of cellulitis in that leg. The patient wants to avoid hospital admission. Labs look reassuring with no renal injury. We gave her Lasix 80mg  IV x1 in the clinic, then will send home to take 80mg  PO bid. I will see her back in my clinic in early January. She has return precautions if respiratory symptoms increase.

## 2019-11-21 NOTE — Assessment & Plan Note (Addendum)
Acute on Chronic combined: Patient presents today with edema of the left extremity greater than right.  Additionally she has pitting edema of the lower abdomen about the umbilicus.  She states that she has mildly increased shortness of breath but only requires oxygen every 30 minutes to an hour.  She is on 3 L supplemental oxygen at home for ambulation.  She denies changes in how she sleeps and continues to sleep on 2.5 pillows.  She denies cough, fever, chills, myalgias, dysuria or hematuria.  She endorses one episode of vomiting after drinking coffee yesterday morning but is been able to tolerate her coffee today.  She notices a mildly decreased diet. She was discharged from the hospital approximate 30 days prior slightly above her dry weight after prolonged admission.  Her weight is relatively unchanged today at 237lb up from 235lb on discharge. Her exam is consistent with volume increase.  Plan: STAT BMP and CBC with differential IM Lasix 80 mg given today Increase Lasix to 80 mg p.o. twice daily Patient is to return on January 11 for a follow-up appointment with Dr. Evette Doffing She is to weigh herself daily She has been given strict return precautions  STAT BMP with stable creatinine and potassium CBC unremarkable.

## 2019-11-21 NOTE — Patient Instructions (Addendum)
FOLLOW-UP INSTRUCTIONS When: Please return on January 11 at your previously scheduled appointment or sooner if needed What to bring: All of your medications  I have not made any changes to your medications today.   As you suspected you appear to have extra volume on board today.  We will increase your Lasix to 80 mg 2 times daily.  As always if your symptoms worsen, fail to improve, or you develop other concerning symptoms, please notify our office or visit the local ER if we are unavailable. Symptoms including shortness of breath, greater than 2 pounds of weight gain in 24 hours, chest pain, increased supplemental oxygen requirement greater than 4 L, should not be ignored and should encourage you to visit the ED if we are unavailable by phone or the symptoms are severe.  Thank you for your visit to the [facility] today. If you have any questions or concerns please call us at [phone].

## 2019-11-21 NOTE — Progress Notes (Signed)
   CC: Swelling in her belly and leg  HPI:[name] is a 63 y.o. female who presents for evaluation of swelling. Please see individual problem based A/P for details.  Past Medical History:  Diagnosis Date  . Abscess of skin of abdomen 08/31/2018  . Acquired lactose intolerance 09/24/2017  . 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  Non-functioning  . Aortic atherosclerosis ([place]) 09/24/2017   Asymptomatic, found on CT scan  . Blood transfusion without reported diagnosis   . Chronic Systolic Heart Failure 123456   Felt to be non-ischemic and secondary to hypertension.  Echo (05/28/2014): LVEF 25%.  Is not interested in AICD placement.  Marland Kitchen COPD exacerbation ([place])   . Coronary artery disease involving native coronary artery of native heart without angina pectoris 11/09/2014   Cardiac cath (02/18/2014): Non-obstructive, mRCA 30%, dRCA 60%  . 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 Diverticulosis of colon 09/24/2017  . Diverticulosis of colon 09/24/2017  . Essential hypertension 10/15/2013  . Gastroesophageal reflux disease 09/24/2017  . History of cerebrovascular accident 05/10/2013   Per patient report 6 previous strokes, most recent on 05/10/13.  No residual deficits.  . History of cerebrovascular accident 05/10/2013   Per patient report 6 previous strokes, most recent on 05/10/13.  No residual deficits.  . Hyperlipidemia   . Overweight (BMI 25.0-29.9) 09/24/2017  . Psoriasis 09/24/2017  . Seasonal allergic rhinitis due to pollen 09/24/2017   Spring and early Fall  . Small Bowel Obstruction (SBO) 01/13/2014   Ex-Lap & Lysis of Adhesion, 01/15/2014  . Thyroid nodule 09/04/2019  . Tobacco use disorder 01/15/2014  . Type 2 diabetes mellitus with moderate nonproliferative diabetic retinopathy ([place]) 10/15/2013   Review of Systems:  ROS negative except as per HPI.  Physical  Exam: Vitals:   11/21/19 0843  BP: (!) 165/77  Pulse: 70  SpO2: 98%  Weight: 237 lb 8 oz (107.7 kg)   General: A/O x4, in no acute distress, afebrile, nondiaphoretic HEENT: PEERL, EMO intact, 4cm JVD Cardio: RRR, no mrg's  Pulmonary: CTA bilaterally, no wheezing or crackles  Abdomen: Bowel sounds normal, 1+ pitting edema to umbilicus MSK: BLE nontender, left-sided pitting edema to the thigh, right-sided pitting edema to mid shin Bedside ultrasound unremarkable for DVT of the femoral vein Psych: Appropriate affect, not depressed in appearance, engages well  Assessment & Plan:   See Encounters Tab for problem based charting.  Patient seen with Dr. Evette Doffing

## 2019-11-21 NOTE — Assessment & Plan Note (Signed)
Left inguinal lymph node: Reactive lymph node of the left inguinal area of 2.2x1cm. Uncertain relevance to presentation. Would recommend monitoring once edema has improved.

## 2019-12-04 ENCOUNTER — Other Ambulatory Visit: Payer: Self-pay

## 2019-12-04 ENCOUNTER — Encounter: Payer: Self-pay | Admitting: Student in an Organized Health Care Education/Training Program

## 2019-12-04 ENCOUNTER — Ambulatory Visit (INDEPENDENT_AMBULATORY_CARE_PROVIDER_SITE_OTHER): Payer: Medicare Other | Admitting: Student in an Organized Health Care Education/Training Program

## 2019-12-04 VITALS — BP 154/64 | HR 70 | Temp 98.5°F | Ht 67.0 in | Wt 234.3 lb

## 2019-12-04 DIAGNOSIS — E113399 Type 2 diabetes mellitus with moderate nonproliferative diabetic retinopathy without macular edema, unspecified eye: Secondary | ICD-10-CM

## 2019-12-04 DIAGNOSIS — Z9981 Dependence on supplemental oxygen: Secondary | ICD-10-CM

## 2019-12-04 DIAGNOSIS — E041 Nontoxic single thyroid nodule: Secondary | ICD-10-CM

## 2019-12-04 DIAGNOSIS — Z8639 Personal history of other endocrine, nutritional and metabolic disease: Secondary | ICD-10-CM | POA: Diagnosis not present

## 2019-12-04 DIAGNOSIS — Z794 Long term (current) use of insulin: Secondary | ICD-10-CM

## 2019-12-04 DIAGNOSIS — I5033 Acute on chronic diastolic (congestive) heart failure: Secondary | ICD-10-CM

## 2019-12-04 DIAGNOSIS — E113311 Type 2 diabetes mellitus with moderate nonproliferative diabetic retinopathy with macular edema, right eye: Secondary | ICD-10-CM | POA: Diagnosis not present

## 2019-12-04 DIAGNOSIS — Z79899 Other long term (current) drug therapy: Secondary | ICD-10-CM

## 2019-12-04 DIAGNOSIS — I5042 Chronic combined systolic (congestive) and diastolic (congestive) heart failure: Secondary | ICD-10-CM

## 2019-12-04 LAB — POCT GLYCOSYLATED HEMOGLOBIN (HGB A1C): Hemoglobin A1C: 8.3 % — AB (ref 4.0–5.6)

## 2019-12-04 LAB — GLUCOSE, CAPILLARY: Glucose-Capillary: 126 mg/dL — ABNORMAL HIGH (ref 70–99)

## 2019-12-04 MED ORDER — FUROSEMIDE 40 MG PO TABS: 80.0000 mg | ORAL_TABLET | Freq: Two times a day (BID) | ORAL | 1 refills | Status: DC

## 2019-12-04 NOTE — Patient Instructions (Signed)
Is great seeing you today in clinic.  You still have too much fluid built up on your legs and your abdomen.  We are working on taking this fluid off with the Lasix medication.  Continue taking 2 tablets twice daily.  Call me if your shortness of breath gets worse.  We will check your blood work today.  Follow-up with [name] in 2 weeks for another exam to ensure that we are bringing this fluid off safely.

## 2019-12-04 NOTE — Assessment & Plan Note (Signed)
Diabetes is well controlled.  Hemoglobin A1c 8.3%, much improved from prior readings.  Plan to continue with Trulicity 1.5 mg weekly, Lantus 50 units nightly, and Humalog 20 units 3 times daily with meals.  Continue with aspirin and atorvastatin for secondary prevention of ischemic vascular disease.  Also on Entresto for RAAS inhibition.

## 2019-12-04 NOTE — Assessment & Plan Note (Signed)
This is 2-week follow-up for intermediate risk acute on chronic heart failure with preserved ejection fraction presenting as volume overload with edema of the lower extremities, abdomen, and elevated JVD.  She is down 3 pounds with outpatient diuresis.  Functional status is improved, now with NYHA class II symptoms, mostly independent in all her activities of daily living.  Uses supplemental oxygen at 3 L/min.  I think we can continue with diuresis as an outpatient, likely would want to see her weight closer to 220 pounds.  Plan to continue with furosemide 80 mg twice daily.  Check BMP today.  Follow-up in 2 weeks for another volume status assessment and BMP.  Gave precautions to call us for worsening dyspnea which will necessitate admission for inpatient diuresis.

## 2019-12-04 NOTE — Assessment & Plan Note (Signed)
Issue is resolved.  Thyroid function by TSH was normal.  Thyroid ultrasound was reassuring, showing mixed heterogeneity, not significantly enlarged, a few nodules but all less than 1 cm and not requiring biopsy or follow-up.

## 2019-12-04 NOTE — Progress Notes (Signed)
   Assessment and Plan:  See Encounters tab for problem-based medical decision making.   __________________________________________________________  HPI:   64 year old person here for follow-up of acute on chronic heart failure with preserved ejection fraction.  She was hospitalized at the end of November for volume overload and received IV Lasix diuresis with a less than optimal response.  After discharge she had accumulated more fluid over a 1 month period.  We saw her at the end of December and noted significant volume overload, was on the border requiring readmission.  We decided to try outpatient diuresis first, we doubled her Lasix dosing, and she has done pretty well at home.  Reports good adherence to the medications.  Reports good urine output.  Says her respiratory status is modestly improved, she can now walk around her house, do her chores without getting short of breath.  She was able to walk through [place] the other day and to shopping, though had to take frequent breaks.  Denies any orthopnea or PND.  No chest pain.  No fevers, no cough.  Good adherence to supplemental oxygen especially with exertion.  __________________________________________________________  Problem List: Patient Active Problem List   Diagnosis Date Noted  . Coronary artery disease involving native coronary artery of native heart without angina pectoris 11/09/2014    Priority: High  . Chronic combined systolic and diastolic heart failure ([place]) 05/10/2014    Priority: High  . Tobacco use disorder 01/15/2014    Priority: High  . Type 2 diabetes mellitus with moderate nonproliferative diabetic retinopathy ([place]) 10/15/2013    Priority: High  . Essential hypertension 10/15/2013    Priority: High  . Sinoatrial node dysfunction ([facility]) 10/17/2018    Priority: Medium  . Aortic atherosclerosis ([place]) 09/24/2017    Priority: Medium  . Gastroesophageal reflux disease 09/24/2017    Priority: Medium  . Obesity  (BMI 30.0-34.9) 09/24/2017    Priority: Medium  . Hyperlipidemia     Priority: Medium  . Nocturnal hypoxemia 12/30/2018    Priority: Low  . Psoriasis 09/24/2017    Priority: Low  . 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 Seasonal allergic rhinitis due to pollen 09/24/2017    Priority: Low  . Healthcare maintenance 09/24/2017    Priority: Low  . 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 Enlarged lymph node 11/21/2019  . Acute on chronic heart failure with preserved ejection fraction (HFpEF) ([place]) 10/21/2019  . Thyroid nodule 09/04/2019    Medications: Reconciled today in Epic __________________________________________________________  Physical Exam:  Vital Signs: Vitals:   12/04/19 0917  BP: (!) 154/64  Pulse: 70  Temp: 98.5 F (36.9 C)  TempSrc: Oral  SpO2: (!) 83%  Weight: 234 lb 4.8 oz (106.3 kg)  Height: 5\' 7"  (1.702 m)    Gen: Well appearing, NAD CV: RRR, no murmurs Pulm: Mildly tachypneic, mild fine crackles at the bilateral bases, no wheezing Abd: Soft, mild lower abdominal wall edema Ext: Warm, 2+ pitting edema on the left lower extremity, 1+ pitting edema on the right

## 2019-12-05 ENCOUNTER — Encounter: Payer: Self-pay | Admitting: Student in an Organized Health Care Education/Training Program

## 2019-12-05 LAB — BMP8+ANION GAP
Anion Gap: 16 mmol/L (ref 10.0–18.0)
BUN/Creatinine Ratio: 15 (ref 12–28)
BUN: 11 mg/dL (ref 8–27)
CO2: 29 mmol/L (ref 20–29)
Calcium: 9.6 mg/dL (ref 8.7–10.3)
Chloride: 93 mmol/L — ABNORMAL LOW (ref 96–106)
Creatinine, Ser: 0.72 mg/dL (ref 0.57–1.00)
GFR calc Af Amer: 105 mL/min/{1.73_m2} (ref >59)
GFR calc non Af Amer: 91 mL/min/{1.73_m2} (ref >59)
Glucose: 115 mg/dL — ABNORMAL HIGH (ref 65–99)
Potassium: 4.2 mmol/L (ref 3.5–5.2)
Sodium: 138 mmol/L (ref 134–144)

## 2019-12-18 ENCOUNTER — Ambulatory Visit (INDEPENDENT_AMBULATORY_CARE_PROVIDER_SITE_OTHER): Payer: Medicare Other | Admitting: Internal Medicine

## 2019-12-18 ENCOUNTER — Encounter: Payer: Self-pay | Admitting: Internal Medicine

## 2019-12-18 ENCOUNTER — Other Ambulatory Visit: Payer: Self-pay

## 2019-12-18 VITALS — BP 167/70 | HR 67 | Temp 98.3°F | Ht 67.0 in | Wt 235.8 lb

## 2019-12-18 DIAGNOSIS — I5042 Chronic combined systolic (congestive) and diastolic (congestive) heart failure: Secondary | ICD-10-CM

## 2019-12-18 DIAGNOSIS — I5033 Acute on chronic diastolic (congestive) heart failure: Secondary | ICD-10-CM

## 2019-12-18 DIAGNOSIS — Z794 Long term (current) use of insulin: Secondary | ICD-10-CM | POA: Diagnosis not present

## 2019-12-18 DIAGNOSIS — J9610 Chronic respiratory failure, unspecified whether with hypoxia or hypercapnia: Secondary | ICD-10-CM

## 2019-12-18 DIAGNOSIS — Z79899 Other long term (current) drug therapy: Secondary | ICD-10-CM

## 2019-12-18 DIAGNOSIS — E113399 Type 2 diabetes mellitus with moderate nonproliferative diabetic retinopathy without macular edema, unspecified eye: Secondary | ICD-10-CM | POA: Diagnosis not present

## 2019-12-18 DIAGNOSIS — Z9981 Dependence on supplemental oxygen: Secondary | ICD-10-CM | POA: Diagnosis not present

## 2019-12-18 DIAGNOSIS — E113393 Type 2 diabetes mellitus with moderate nonproliferative diabetic retinopathy without macular edema, bilateral: Secondary | ICD-10-CM

## 2019-12-18 LAB — BASIC METABOLIC PANEL
Anion gap: 8 (ref 5–15)
BUN: 9 mg/dL (ref 8–23)
CO2: 35 mmol/L — ABNORMAL HIGH (ref 22–32)
Calcium: 8.6 mg/dL — ABNORMAL LOW (ref 8.9–10.3)
Chloride: 97 mmol/L — ABNORMAL LOW (ref 98–111)
Creatinine, Ser: 0.69 mg/dL (ref 0.44–1.00)
GFR calc Af Amer: >60 mL/min (ref >60)
GFR calc non Af Amer: >60 mL/min (ref >60)
Glucose, Bld: 114 mg/dL — ABNORMAL HIGH (ref 70–99)
Potassium: 3.7 mmol/L (ref 3.5–5.1)
Sodium: 140 mmol/L (ref 135–145)

## 2019-12-18 MED ORDER — METOLAZONE 2.5 MG PO TABS: 2.5000 mg | ORAL_TABLET | Freq: Every day | ORAL | 0 refills | Status: DC

## 2019-12-18 MED ORDER — POTASSIUM CHLORIDE ER 20 MEQ PO TBCR: 20.0000 meq | EXTENDED_RELEASE_TABLET | Freq: Two times a day (BID) | ORAL | 0 refills | Status: DC

## 2019-12-18 NOTE — Assessment & Plan Note (Signed)
Patient notes poor appetite lately--likely attributable to her hypervolemia and gut edema. She has noticed her blood sugars have also been lower and is inquiring about decreasing her insulin dose. She brought her meter in today and it was downloaded. Appears that she has been running fairly steady within range with a single glucose reading of 75 last evening.  Plan: discussed with her that her poor appetite is likely from her hypervolemia and should improve with diuresis. With that in mind, if she does continue to have symptoms associated with her blood sugars being <100, we may need to make adjustments but I think that it is reasonable to keep her antihyperglycemics where they are for now and see how her appetite improves.

## 2019-12-18 NOTE — Progress Notes (Signed)
   CC: acute on chronic heart failure, volume status evaluation  HPI:  [name] is a 64 y.o. female who presents for follow up on diuresis for acute on chronic heart failure. Please see problem based assessment and plan for additional details.     Past Medical History:  Diagnosis Date  . Abscess of skin of abdomen 08/31/2018  . Acquired lactose intolerance 09/24/2017  . 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  Non-functioning  . Aortic atherosclerosis ([place]) 09/24/2017   Asymptomatic, found on CT scan  . Blood transfusion without reported diagnosis   . Chronic Systolic Heart Failure 123456   Felt to be non-ischemic and secondary to hypertension.  Echo (05/28/2014): LVEF 25%.  Is not interested in AICD placement.  Marland Kitchen COPD exacerbation ([place])   . Coronary artery disease involving native coronary artery of native heart without angina pectoris 11/09/2014   Cardiac cath (02/18/2014): Non-obstructive, mRCA 30%, dRCA 60%  . 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 Diverticulosis of colon 09/24/2017  . Diverticulosis of colon 09/24/2017  . Essential hypertension 10/15/2013  . Gastroesophageal reflux disease 09/24/2017  . History of cerebrovascular accident 05/10/2013   Per patient report 6 previous strokes, most recent on 05/10/13.  No residual deficits.  . History of cerebrovascular accident 05/10/2013   Per patient report 6 previous strokes, most recent on 05/10/13.  No residual deficits.  . Hyperlipidemia   . Overweight (BMI 25.0-29.9) 09/24/2017  . Psoriasis 09/24/2017  . Seasonal allergic rhinitis due to pollen 09/24/2017   Spring and early Fall  . Small Bowel Obstruction (SBO) 01/13/2014   Ex-Lap & Lysis of Adhesion, 01/15/2014  . Thyroid nodule 09/04/2019  . Tobacco use disorder 01/15/2014  . Type 2 diabetes mellitus with moderate nonproliferative diabetic retinopathy ([place])  10/15/2013    Review of Systems:  Review of Systems - General ROS: negative for - chills or fever Respiratory ROS: negative for - shortness of breath Cardiovascular ROS: positive for - orthopnea negative for - shortness of breath Gastrointestinal ROS: positive for - appetite loss negative for - nausea/vomiting Genito-Urinary ROS: negative for - dysuria   Physical Exam:  There were no vitals filed for this visit.  GENERAL: well appearing, in no apparent distress CARDIAC: RRR. +3 edema to LLE extending to hip. +1 edema to RLE. +presacral edema. No JVD. PULMONARY: lung sounds clear to auscultation. On 3L [place] [facility]: LLE warmer than right. No rash or erythema.   Assessment & Plan:   See Encounters Tab for problem based charting.  Pertinent labs & imaging results that were available during my care of the patient were reviewed by me and considered in my medical decision making  Patient is in agreement with the plan and endorses no further questions at this time.  Patient discussed with Dr. Marcille Buffy, MD Internal Medicine Resident-PGY1 12/18/19

## 2019-12-18 NOTE — Assessment & Plan Note (Addendum)
2w follow up for acute on chronic HFpEF. She was recently seen 2w ago by Dr. Evette Doffing for re-evaluation of this and her volume status at which time she was continued on 80mg  lasix bid.  Chronic respiratory failure with chronic use of 3L supplemental O2 use. No increase requirement of her supplemental O2. Continues to sleep on 2 pillows. Dry weight approxe since prior visit: +1.5lb PE: +3 LLE  220lb. Today's weight 235.  Weight changedema extending to the hip, +1RLE to the knee, no JVD  Assessment: still volume up and has actually had a weight increase since prior visit. Symptomatically, she is unchanged from 2w ago. Discussed admission vs outpatient diuresis and patient would like to try this at home. Leg edema discrepency is somewhat concerning however she has no other features concerning for VTE. Left leg is slightly warmer than the right but has no other features concerning for cellulitis however if she develops more pain in that leg, then I think it would be reasonable to pursue further evaluation of both of these things.  Plan: Will recheck BMP today. Will increase lasix to 120mg  bid. Will add 2.5mg  metalazone daily and 80meq K bid. F/u in 3-4d for volume status and bmp recheck. Would highly consider admission if no weight/symptom change at next visit.

## 2019-12-18 NOTE — Patient Instructions (Addendum)
It was a pleasure meeting you today!  We are going to increase your lasix to 120mg  twice a day. I am going to also send in a prescription for metalazone which will help the lasix work better. Please take it 30 minutes before you take the lasix and just once a day. You will do this for 3 days. I am also sending you in potassium. Please come back to the clinic in 3 days for re-evaluation.

## 2019-12-20 DIAGNOSIS — J9611 Chronic respiratory failure with hypoxia: Secondary | ICD-10-CM | POA: Diagnosis not present

## 2019-12-20 NOTE — Progress Notes (Signed)
Internal Medicine Clinic Attending  Case discussed with Dr. Christian at the time of the visit.  We reviewed the resident's history and exam and pertinent patient test results.  I agree with the assessment, diagnosis, and plan of care documented in the resident's note.    

## 2019-12-22 ENCOUNTER — Encounter: Payer: Self-pay | Admitting: Internal Medicine

## 2019-12-22 ENCOUNTER — Ambulatory Visit (INDEPENDENT_AMBULATORY_CARE_PROVIDER_SITE_OTHER): Payer: Medicare Other | Admitting: Internal Medicine

## 2019-12-22 VITALS — BP 132/65 | HR 72 | Temp 98.2°F | Ht 67.0 in | Wt 219.4 lb

## 2019-12-22 DIAGNOSIS — Z79899 Other long term (current) drug therapy: Secondary | ICD-10-CM

## 2019-12-22 DIAGNOSIS — I5042 Chronic combined systolic (congestive) and diastolic (congestive) heart failure: Secondary | ICD-10-CM

## 2019-12-22 DIAGNOSIS — I5033 Acute on chronic diastolic (congestive) heart failure: Secondary | ICD-10-CM | POA: Diagnosis not present

## 2019-12-22 DIAGNOSIS — I10 Essential (primary) hypertension: Secondary | ICD-10-CM

## 2019-12-22 DIAGNOSIS — E113393 Type 2 diabetes mellitus with moderate nonproliferative diabetic retinopathy without macular edema, bilateral: Secondary | ICD-10-CM

## 2019-12-22 DIAGNOSIS — I110 Hypertensive heart disease with heart failure: Secondary | ICD-10-CM | POA: Diagnosis not present

## 2019-12-22 LAB — BASIC METABOLIC PANEL
Anion gap: 12 (ref 5–15)
BUN: 30 mg/dL — ABNORMAL HIGH (ref 8–23)
CO2: 38 mmol/L — ABNORMAL HIGH (ref 22–32)
Calcium: 9.2 mg/dL (ref 8.9–10.3)
Chloride: 85 mmol/L — ABNORMAL LOW (ref 98–111)
Creatinine, Ser: 1.1 mg/dL — ABNORMAL HIGH (ref 0.44–1.00)
GFR calc Af Amer: >60 mL/min (ref >60)
GFR calc non Af Amer: 54 mL/min — ABNORMAL LOW (ref >60)
Glucose, Bld: 101 mg/dL — ABNORMAL HIGH (ref 70–99)
Potassium: 3.6 mmol/L (ref 3.5–5.1)
Sodium: 135 mmol/L (ref 135–145)

## 2019-12-22 LAB — GLUCOSE, CAPILLARY: Glucose-Capillary: 90 mg/dL (ref 70–99)

## 2019-12-22 NOTE — Patient Instructions (Signed)
I am so happy to see you are doing well. You are down 16 pounds today so good work! I would like you to keep taking the 3 lasix pills twice a day.  Please come back later next week so we can see how your fluid is doing.

## 2019-12-23 NOTE — Assessment & Plan Note (Signed)
Chronic hypertension. Blood pressure significantly improved today compared to last visit. Likely a consequence of significant diuresis. No medication changes today.

## 2019-12-23 NOTE — Assessment & Plan Note (Signed)
Heart failure exacerbation. Patient presents for follow up on heart failure exacerbation which she has been dealing with since December. At last visit, we increased her lasix to 120mg  bid and did a 3d course of metalazone.  Today, she reports feeling much better and reports excellent urine output since then. Continues to have no respiratory or cardiac symptoms. Assessment: Down 16 lbs since 1/25. LE significantly improved. No change in O2 requirement.  Rechecked a BMP today which did show a mild AKI but otherwise electrolytes were stable. Plan -Will continue the higher dose of 120mg  lasix bid -Will hold off on further metalazone dosing. -Discussed with her the importance of daily weights and encouraged her to obtain a scale since she does not have one. -F/u early next week to re-evaluate volume status and AKI

## 2019-12-23 NOTE — Progress Notes (Signed)
CC: heart failure exacerbation  HPI:  [name] is a 64 y.o. female who presents for follow up of heart failure exacerbation.  Please see problem based assessment and plan for additional details.     Past Medical History:  Diagnosis Date  . Abscess of skin of abdomen 08/31/2018  . Acquired lactose intolerance 09/24/2017  . 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  Non-functioning  . Aortic atherosclerosis ([place]) 09/24/2017   Asymptomatic, found on CT scan  . Blood transfusion without reported diagnosis   . Chronic Systolic Heart Failure 123456   Felt to be non-ischemic and secondary to hypertension.  Echo (05/28/2014): LVEF 25%.  Is not interested in AICD placement.  Marland Kitchen COPD exacerbation ([place])   . Coronary artery disease involving native coronary artery of native heart without angina pectoris 11/09/2014   Cardiac cath (02/18/2014): Non-obstructive, mRCA 30%, dRCA 60%  . 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 Diverticulosis of colon 09/24/2017  . Diverticulosis of colon 09/24/2017  . Essential hypertension 10/15/2013  . Gastroesophageal reflux disease 09/24/2017  . History of cerebrovascular accident 05/10/2013   Per patient report 6 previous strokes, most recent on 05/10/13.  No residual deficits.  . History of cerebrovascular accident 05/10/2013   Per patient report 6 previous strokes, most recent on 05/10/13.  No residual deficits.  . Hyperlipidemia   . Overweight (BMI 25.0-29.9) 09/24/2017  . Psoriasis 09/24/2017  . Seasonal allergic rhinitis due to pollen 09/24/2017   Spring and early Fall  . Small Bowel Obstruction (SBO) 01/13/2014   Ex-Lap & Lysis of Adhesion, 01/15/2014  . Thyroid nodule 09/04/2019  . Tobacco use disorder 01/15/2014  . Type 2 diabetes mellitus with moderate nonproliferative diabetic retinopathy ([place]) 10/15/2013    Review of Systems:  Review of  Systems - General ROS: negative for - chills or fever Respiratory ROS: no cough, shortness of breath, or wheezing Cardiovascular ROS: positive for - edema negative for - chest pain, dyspnea on exertion or paroxysmal nocturnal dyspnea   Physical Exam:  Vitals:   12/22/19 0917  BP: 132/65  Pulse: 72  Temp: 98.2 F (36.8 C)  SpO2: 99%  Weight: 219 lb 6.4 oz (99.5 kg)  Height: 5\' 7"  (1.702 m)    GENERAL: well appearing, in no apparent distress CARDIAC: heart regular rate and rhythm. +1 LE edema which is significantly improved from earlier this week. PULMONARY: lung sounds clear to auscultation   Assessment & Plan:   Heart failure exacerbation. Patient presents for follow up on heart failure exacerbation which she has been dealing with since December. At last visit, we increased her lasix to 120mg  bid and did a 3d course of metalazone.  Today, she reports feeling much better and reports excellent urine output since then. Continues to have no respiratory or cardiac symptoms. Assessment: Down 16 lbs since 1/25. LE significantly improved. No change in O2 requirement.  Rechecked a BMP today which did show a mild AKI but otherwise electrolytes were stable. Plan -Will continue the higher dose of 120mg  lasix bid -Will hold off on further metalazone dosing. -Discussed with her the importance of daily weights and encouraged her to obtain a scale since she does not have one. -F/u early next week to re-evaluate volume status and AKI  Chronic hypertension. Blood pressure significantly improved today compared to last visit. Likely a consequence of significant diuresis. No  medication changes today.  Patient is in agreement with the plan and endorses no further questions at this time.  Patient discussed with Dr. Adolm Joseph, MD Internal Medicine Resident-PGY1 12/23/19

## 2019-12-25 NOTE — Progress Notes (Signed)
Internal Medicine Clinic Attending  Case discussed with Dr. Darrick Meigs at the time of the visit.  We reviewed the resident's history and exam and pertinent patient test results.  I agree with the assessment, diagnosis, and plan of care documented in the resident's note.    Given worsening creatinine would cut back on lasix to 120 mg and 80 mg from 120 bid.

## 2019-12-28 ENCOUNTER — Encounter: Payer: Self-pay | Admitting: Internal Medicine

## 2019-12-28 ENCOUNTER — Ambulatory Visit (INDEPENDENT_AMBULATORY_CARE_PROVIDER_SITE_OTHER): Payer: Medicare Other | Admitting: Internal Medicine

## 2019-12-28 VITALS — BP 147/67 | HR 71 | Temp 98.3°F | Ht 67.0 in | Wt 223.1 lb

## 2019-12-28 DIAGNOSIS — Z79899 Other long term (current) drug therapy: Secondary | ICD-10-CM | POA: Diagnosis not present

## 2019-12-28 DIAGNOSIS — I10 Essential (primary) hypertension: Secondary | ICD-10-CM | POA: Diagnosis not present

## 2019-12-28 DIAGNOSIS — I5033 Acute on chronic diastolic (congestive) heart failure: Secondary | ICD-10-CM

## 2019-12-28 NOTE — Assessment & Plan Note (Signed)
HFpEF (EF 50-55% 2020): She was hospitalized from October 20, 2019 to October 24, 2019 for acute exacerbation of heart failure with preserved ejection fraction.  Since discharge, she has had several clinic visits for titration of her heart failure medications.  Summary below:  10/24/2019: Discharge from hospital discharge weight 235 pounds.  Discharged on p.o. Lasix 60 mg twice daily  11/21/2019: Weight 237 pounds.  Given IV Lasix 80 mg x 1 in clinic.  P.o. Lasix increased to 80 mg twice daily  12/04/2019: Weight 234 pounds.  P.o. Lasix 80 mg continued  12/18/2019: Weight 235 pounds.  P.o. Lasix increased 220 mg twice daily.  Metolazone 2.5 mg daily added  12/21/2018: Weight 219 pounds.  Patient instructed to take p.o. Lasix 120 mg every morning and 80 mg every afternoon due to increasing creatinine (however she continued to take 120 mg twice daily). Plus metolazone 2.5 mg daily  12/28/2019 (today): Weight 223 pounds.  Clinically, she states she is doing better.  She denies shortness of breath, orthopnea.  She does use supplemental oxygen at baseline and she has been currently using it intermittently.  Plan: -Obtain BMP -Continue Lasix 120 twice daily plus metolazone 2.5 mg daily for now.  Will make changes after BMP results.

## 2019-12-28 NOTE — Progress Notes (Signed)
   CC: Follow up HFpEF  HPI:  [name] is a 64 y.o. community dwelling woman with medical history significant for HFpEF (EF 55-60% 2020) here for evaluation.  Please see problem based charting for further details.  Past Medical History:  Diagnosis Date  . Abscess of skin of abdomen 08/31/2018  . Acquired lactose intolerance 09/24/2017  . 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  Non-functioning  . Aortic atherosclerosis ([place]) 09/24/2017   Asymptomatic, found on CT scan  . Blood transfusion without reported diagnosis   . Chronic Systolic Heart Failure 123456   Felt to be non-ischemic and secondary to hypertension.  Echo (05/28/2014): LVEF 25%.  Is not interested in AICD placement.  Marland Kitchen COPD exacerbation ([place])   . Coronary artery disease involving native coronary artery of native heart without angina pectoris 11/09/2014   Cardiac cath (02/18/2014): Non-obstructive, mRCA 30%, dRCA 60%  . 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 Diverticulosis of colon 09/24/2017  . Diverticulosis of colon 09/24/2017  . Essential hypertension 10/15/2013  . Gastroesophageal reflux disease 09/24/2017  . History of cerebrovascular accident 05/10/2013   Per patient report 6 previous strokes, most recent on 05/10/13.  No residual deficits.  . History of cerebrovascular accident 05/10/2013   Per patient report 6 previous strokes, most recent on 05/10/13.  No residual deficits.  . Hyperlipidemia   . Overweight (BMI 25.0-29.9) 09/24/2017  . Psoriasis 09/24/2017  . Seasonal allergic rhinitis due to pollen 09/24/2017   Spring and early Fall  . Small Bowel Obstruction (SBO) 01/13/2014   Ex-Lap & Lysis of Adhesion, 01/15/2014  . Thyroid nodule 09/04/2019  . Tobacco use disorder 01/15/2014  . Type 2 diabetes mellitus with moderate nonproliferative diabetic retinopathy ([place]) 10/15/2013   Review of Systems:   As per HPI  Physical Exam:  Vitals:   12/28/19 0853  BP: (!) 147/67  Pulse: 71  Temp: 98.3 F (36.8 C)  TempSrc: Oral  SpO2: 98%  Weight: 223 lb 1.6 oz (101.2 kg)  Height: 5\' 7"  (1.702 m)   Physical Exam  Constitutional: She is well-developed, well-nourished, and in no distress.  Cardiovascular: Normal rate, regular rhythm and normal heart sounds.  No murmur heard. Pulmonary/Chest: Effort normal and breath sounds normal. No respiratory distress. She has no wheezes.  Musculoskeletal:        General: Edema: Trace.    Assessment & Plan:   See Encounters Tab for problem based charting.  Patient discussed with Dr. Philipp Ovens

## 2019-12-28 NOTE — Progress Notes (Signed)
Internal Medicine Clinic Attending  Case discussed with Dr. Agyei at the time of the visit.  We reviewed the resident's history and exam and pertinent patient test results.  I agree with the assessment, diagnosis, and plan of care documented in the resident's note.    

## 2019-12-28 NOTE — Patient Instructions (Signed)
Alicia [name],   Thanks for coming in to see [name]. I will order labs today and call you if I need to make any changes to your fluid pills.   Take Care! Dr. Eileen Stanford  Please call the internal medicine center clinic if you have any questions or concerns, we may be able to help and keep you from a long and expensive emergency room wait. Our clinic and after hours phone number is [phone], the best time to call is Monday through [place] 9 am to 4 pm but there is always someone available 24/7 if you have an emergency. If you need medication refills please notify your pharmacy one week in advance and they will send [name] a request.

## 2019-12-29 ENCOUNTER — Telehealth: Payer: Self-pay | Admitting: Internal Medicine

## 2019-12-29 LAB — BMP8+ANION GAP
Anion Gap: 17 mmol/L (ref 10.0–18.0)
BUN/Creatinine Ratio: 26 (ref 12–28)
BUN: 22 mg/dL (ref 8–27)
CO2: 29 mmol/L (ref 20–29)
Calcium: 8.9 mg/dL (ref 8.7–10.3)
Chloride: 91 mmol/L — ABNORMAL LOW (ref 96–106)
Creatinine, Ser: 0.86 mg/dL (ref 0.57–1.00)
GFR calc Af Amer: 84 mL/min/{1.73_m2} (ref >59)
GFR calc non Af Amer: 73 mL/min/{1.73_m2} (ref >59)
Glucose: 230 mg/dL — ABNORMAL HIGH (ref 65–99)
Potassium: 4 mmol/L (ref 3.5–5.2)
Sodium: 137 mmol/L (ref 134–144)

## 2019-12-29 NOTE — Telephone Encounter (Signed)
Called and Discussed Lab results.

## 2020-01-10 ENCOUNTER — Encounter: Payer: Self-pay | Admitting: Cardiology

## 2020-01-10 ENCOUNTER — Ambulatory Visit (INDEPENDENT_AMBULATORY_CARE_PROVIDER_SITE_OTHER): Payer: Medicare Other | Admitting: Cardiology

## 2020-01-10 ENCOUNTER — Other Ambulatory Visit: Payer: Self-pay

## 2020-01-10 ENCOUNTER — Ambulatory Visit: Payer: Medicare Other | Admitting: Nurse Practitioner

## 2020-01-10 VITALS — BP 120/70 | HR 69 | Ht 67.0 in | Wt 222.8 lb

## 2020-01-10 DIAGNOSIS — I10 Essential (primary) hypertension: Secondary | ICD-10-CM | POA: Diagnosis not present

## 2020-01-10 DIAGNOSIS — I428 Other cardiomyopathies: Secondary | ICD-10-CM

## 2020-01-10 DIAGNOSIS — E119 Type 2 diabetes mellitus without complications: Secondary | ICD-10-CM

## 2020-01-10 DIAGNOSIS — I5042 Chronic combined systolic (congestive) and diastolic (congestive) heart failure: Secondary | ICD-10-CM | POA: Diagnosis not present

## 2020-01-10 NOTE — Patient Instructions (Signed)
Medication Instructions:  The current medical regimen is effective;  continue present plan and medications.  *If you need a refill on your cardiac medications before your next appointment, please call your pharmacy*  Follow-Up: At [facility], you and your health needs are our priority.  As part of our continuing mission to provide you with exceptional heart care, we have created designated Provider Care Teams.  These Care Teams include your primary Cardiologist (physician) and Advanced Practice Providers (APPs -  Physician Assistants and Nurse Practitioners) who all work together to provide you with the care you need, when you need it.  Your next appointment:   4 month(s)  The format for your next appointment:   In Person  Provider:   Candee Furbish, MD  Thank you for choosing [facility]!!
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
## 2020-01-20 DIAGNOSIS — J9611 Chronic respiratory failure with hypoxia: Secondary | ICD-10-CM | POA: Diagnosis not present

## 2020-01-22 ENCOUNTER — Other Ambulatory Visit: Payer: Self-pay | Admitting: *Deleted

## 2020-01-23 ENCOUNTER — Telehealth: Payer: Self-pay | Admitting: Student in an Organized Health Care Education/Training Program

## 2020-01-23 MED ORDER — FUROSEMIDE 40 MG PO TABS: 120.0000 mg | ORAL_TABLET | Freq: Two times a day (BID) | ORAL | 0 refills | Status: DC

## 2020-01-23 NOTE — Telephone Encounter (Signed)
Pt is stating [employer] is saying its time for her to renewal oxygen pls contact [phone]

## 2020-01-23 NOTE — Telephone Encounter (Signed)
Furosemide 120 twice daily is on the medication list but has no prescriber, quantity, or refills.  Reviewed internal medicine and cardiology notes and she was increased to 120 twice daily by internal medicine and continued by cardiology.  Will schedule her late March [place] appointment to assess weight, symptoms, and renal function to ensure this is the correct dose to continue.

## 2020-01-23 NOTE — Telephone Encounter (Signed)
Spoke with the patient.  She has sch an appt for 02/19/2020 with the [facility].

## 2020-01-25 NOTE — Telephone Encounter (Signed)
Returned call to patient. States [employer] notified her that she needs to re-qualify for her home O2. Currently using 4 L/min via [employer]. Patient has upcoming [place] appt on 02/19/2020. Re-qualification can be done at that time. Hubbard Hartshorn, BSN, RN-BC

## 2020-02-17 DIAGNOSIS — J9611 Chronic respiratory failure with hypoxia: Secondary | ICD-10-CM | POA: Diagnosis not present

## 2020-02-19 ENCOUNTER — Ambulatory Visit (INDEPENDENT_AMBULATORY_CARE_PROVIDER_SITE_OTHER): Payer: Medicare Other | Admitting: Internal Medicine

## 2020-02-19 ENCOUNTER — Encounter: Payer: Self-pay | Admitting: Internal Medicine

## 2020-02-19 VITALS — BP 165/75 | HR 63 | Temp 98.3°F | Wt 226.8 lb

## 2020-02-19 DIAGNOSIS — I5042 Chronic combined systolic (congestive) and diastolic (congestive) heart failure: Secondary | ICD-10-CM | POA: Diagnosis not present

## 2020-02-19 DIAGNOSIS — Z9981 Dependence on supplemental oxygen: Secondary | ICD-10-CM

## 2020-02-19 DIAGNOSIS — I5033 Acute on chronic diastolic (congestive) heart failure: Secondary | ICD-10-CM | POA: Diagnosis not present

## 2020-02-19 DIAGNOSIS — J449 Chronic obstructive pulmonary disease, unspecified: Secondary | ICD-10-CM | POA: Diagnosis not present

## 2020-02-19 DIAGNOSIS — Z79899 Other long term (current) drug therapy: Secondary | ICD-10-CM

## 2020-02-19 DIAGNOSIS — J9611 Chronic respiratory failure with hypoxia: Secondary | ICD-10-CM | POA: Diagnosis not present

## 2020-02-19 NOTE — Assessment & Plan Note (Signed)
  CHF: Follow-up for HFpEF GIIDD with mildly dilated atrium. Her weight is stable since her hospital discharge at 226 today. She endorses consuming a "good deal" more food  over the past several months than normal but denied consumption of higher volumes of fluids or salty foods. She did not appear grossly volume overloaded on exam today. Last weight 223lbs. She was continued on Lasix 120mg  BID. She did not recall if she was taking any potassium supplements.   Plan:  Continue Entresto 97-103mg  BID Continue potassium 20 mEq BID Continue Lasix 120mg  BID BMP today

## 2020-02-19 NOTE — Progress Notes (Signed)
Internal Medicine Clinic Attending  Case discussed with Dr. Harbrecht at the time of the visit.  We reviewed the resident's history and exam and pertinent patient test results.  I agree with the assessment, diagnosis, and plan of care documented in the resident's note.   

## 2020-02-19 NOTE — Progress Notes (Signed)
   CC: COPD and HFpEF  HPI:[name] is a 64 y.o. female who presents for evaluation of COPD and CHF. Please see individual problem based A/P for details.  Depression, PHQ-9: Based on the patients    Office Visit from 02/19/2020 in [place]  PHQ-9 Total Score  0     score we have decided to monitor.  Past Medical History:  Diagnosis Date  . Abscess of skin of abdomen 08/31/2018  . Acquired lactose intolerance 09/24/2017  . 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  Non-functioning  . Aortic atherosclerosis ([place]) 09/24/2017   Asymptomatic, found on CT scan  . Blood transfusion without reported diagnosis   . Chronic Systolic Heart Failure 123456   Felt to be non-ischemic and secondary to hypertension.  Echo (05/28/2014): LVEF 25%.  Is not interested in AICD placement.  Marland Kitchen COPD exacerbation ([place])   . Coronary artery disease involving native coronary artery of native heart without angina pectoris 11/09/2014   Cardiac cath (02/18/2014): Non-obstructive, mRCA 30%, dRCA 60%  . 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 Diverticulosis of colon 09/24/2017  . Diverticulosis of colon 09/24/2017  . Essential hypertension 10/15/2013  . Gastroesophageal reflux disease 09/24/2017  . History of cerebrovascular accident 05/10/2013   Per patient report 6 previous strokes, most recent on 05/10/13.  No residual deficits.  . History of cerebrovascular accident 05/10/2013   Per patient report 6 previous strokes, most recent on 05/10/13.  No residual deficits.  . Hyperlipidemia   . Overweight (BMI 25.0-29.9) 09/24/2017  . Psoriasis 09/24/2017  . Seasonal allergic rhinitis due to pollen 09/24/2017   Spring and early Fall  . Small Bowel Obstruction (SBO) 01/13/2014   Ex-Lap & Lysis of Adhesion, 01/15/2014  . Thyroid nodule 09/04/2019  . Tobacco use disorder 01/15/2014  . Type  2 diabetes mellitus with moderate nonproliferative diabetic retinopathy ([place]) 10/15/2013   Review of Systems:  ROS negative except as per HPI.  Physical Exam: Vitals:   02/19/20 0910  BP: (!) 165/75  Pulse: 63  Temp: 98.3 F (36.8 C)  TempSrc: Oral  SpO2: (!) 86%  Weight: 226 lb 12.8 oz (102.9 kg)   Filed Weights   02/19/20 0910  Weight: 226 lb 12.8 oz (102.9 kg)   General: A/O x4, in no acute distress, afebrile, nondiaphoretic HEENT: EMO intact, atraumatic  Cardio: RRR, no mrg's, no JVD on my exam Pulmonary: CTA bilaterally, faint end expiraotry wheezing bilaterally, no crackles or rhonchi  Abdomen: Bowel sounds normal, soft, nontender  MSK: BLE nontender, LLE mildly edematous, RLE nonedematous today Neuro: Alert, normal gait Psych: Appropriate affect, not depressed in appearance, engages well  Assessment & Plan:   See Encounters Tab for problem based charting.  Patient discussed with Dr. Philipp Ovens

## 2020-02-19 NOTE — Telephone Encounter (Signed)
Community message sent to [employer] at [place] that appt to re-certify for home oxygen was today and liter flow changed to 2 liters via nasal cannula. Hubbard Hartshorn, BSN, RN-BC

## 2020-02-19 NOTE — Assessment & Plan Note (Signed)
Abnormal PFT's: Respiratory failure with Chronic supplemental oxygen requirements:  Patient with abnormal PFT's, FEV1/FVC ratio >70 at 83 with FVC low at 43 and FEV1 45, TLC <80 at 70 and DLCO low at 44. Concerning for restrictive process although the official read indicates obstruction. I do agree that the volume flow graphs have a typical COPD pattern which in the setting of a FVC and FEV1 as low as they are may not be as dependable. Patient's O2 saturation <88% at 86% at rest on room air. She ambulates well on 2L not falling below 93%.  The etiology of her hypoxia is not clear. I personally reviewed her CT PE study from 2015 and her PFT's from 2019. There is notable ground glass attenuation on CT without prominent traction bronchiectasis or honeycombing.   Plan: Recommend consideration of High resolution CT chest  Will renew Supplemental oxygen order at 2L today

## 2020-02-19 NOTE — Progress Notes (Signed)
SATURATION QUALIFICATIONS: (This note is used to comply with regulatory documentation for home oxygen)  Patient Saturations on Room Air at Rest = 86%  Patient Saturations on Room Air while Ambulating = not obtained (at rest value of below 88% qualifies pt for home O2)  Patient Saturations on 2 Liters of oxygen while Ambulating = 92-93%  Please briefly explain why patient needs home oxygen: see office note

## 2020-02-19 NOTE — Patient Instructions (Signed)
FOLLOW-UP INSTRUCTIONS When: 4-8 wks For: Routine with her PCP What to bring: All of your medications  I have not made any changes to your medications today.   Today we discussed your shortness of breath. I do feel that some of this is due to a lung issue as well as your heart failure. We have obtained a lab today and will notify you of the results of any labs from today's evaluation when available to me. I will reach out to your primary doctor and inquire if additional lung testing is warranted.  Thank you for your visit to the [facility] today. If you have any questions or concerns please call us at [phone].

## 2020-02-20 LAB — BMP8+ANION GAP
Anion Gap: 16 mmol/L (ref 10.0–18.0)
BUN/Creatinine Ratio: 18 (ref 12–28)
BUN: 12 mg/dL (ref 8–27)
CO2: 30 mmol/L — ABNORMAL HIGH (ref 20–29)
Calcium: 9.2 mg/dL (ref 8.7–10.3)
Chloride: 94 mmol/L — ABNORMAL LOW (ref 96–106)
Creatinine, Ser: 0.68 mg/dL (ref 0.57–1.00)
GFR calc Af Amer: 108 mL/min/{1.73_m2} (ref >59)
GFR calc non Af Amer: 94 mL/min/{1.73_m2} (ref >59)
Glucose: 168 mg/dL — ABNORMAL HIGH (ref 65–99)
Potassium: 4 mmol/L (ref 3.5–5.2)
Sodium: 140 mmol/L (ref 134–144)

## 2020-02-20 NOTE — Telephone Encounter (Signed)
[place], Danna Hefty, Orvis Brill, RN; [place], [place], [place]   We have this   Thanks

## 2020-02-29 ENCOUNTER — Other Ambulatory Visit: Payer: Self-pay | Admitting: Student in an Organized Health Care Education/Training Program

## 2020-02-29 DIAGNOSIS — E113393 Type 2 diabetes mellitus with moderate nonproliferative diabetic retinopathy without macular edema, bilateral: Secondary | ICD-10-CM

## 2020-03-07 ENCOUNTER — Other Ambulatory Visit: Payer: Self-pay | Admitting: Internal Medicine

## 2020-03-07 DIAGNOSIS — Z1231 Encounter for screening mammogram for malignant neoplasm of breast: Secondary | ICD-10-CM

## 2020-03-19 DIAGNOSIS — J9611 Chronic respiratory failure with hypoxia: Secondary | ICD-10-CM | POA: Diagnosis not present

## 2020-03-25 ENCOUNTER — Other Ambulatory Visit: Payer: Self-pay | Admitting: Student in an Organized Health Care Education/Training Program

## 2020-03-25 DIAGNOSIS — E113393 Type 2 diabetes mellitus with moderate nonproliferative diabetic retinopathy without macular edema, bilateral: Secondary | ICD-10-CM

## 2020-04-04 ENCOUNTER — Ambulatory Visit (INDEPENDENT_AMBULATORY_CARE_PROVIDER_SITE_OTHER): Payer: Medicare Other

## 2020-04-04 ENCOUNTER — Other Ambulatory Visit: Payer: Self-pay

## 2020-04-04 ENCOUNTER — Ambulatory Visit (HOSPITAL_COMMUNITY)
Admission: EM | Admit: 2020-04-04 | Discharge: 2020-04-04 | Disposition: A | Discharge status: Home / Self Care | Payer: Medicare Other | Attending: Family Medicine | Admitting: Family Medicine

## 2020-04-04 ENCOUNTER — Encounter (HOSPITAL_COMMUNITY): Payer: Self-pay

## 2020-04-04 ENCOUNTER — Telehealth: Payer: Self-pay | Admitting: *Deleted

## 2020-04-04 DIAGNOSIS — R0602 Shortness of breath: Secondary | ICD-10-CM

## 2020-04-04 DIAGNOSIS — I509 Heart failure, unspecified: Secondary | ICD-10-CM | POA: Diagnosis not present

## 2020-04-04 DIAGNOSIS — Z833 Family history of diabetes mellitus: Secondary | ICD-10-CM | POA: Insufficient documentation

## 2020-04-04 DIAGNOSIS — E785 Hyperlipidemia, unspecified: Secondary | ICD-10-CM | POA: Diagnosis not present

## 2020-04-04 DIAGNOSIS — Z823 Family history of stroke: Secondary | ICD-10-CM | POA: Diagnosis not present

## 2020-04-04 DIAGNOSIS — Z20822 Contact with and (suspected) exposure to covid-19: Secondary | ICD-10-CM | POA: Insufficient documentation

## 2020-04-04 DIAGNOSIS — I110 Hypertensive heart disease with heart failure: Secondary | ICD-10-CM | POA: Insufficient documentation

## 2020-04-04 DIAGNOSIS — E113399 Type 2 diabetes mellitus with moderate nonproliferative diabetic retinopathy without macular edema, unspecified eye: Secondary | ICD-10-CM | POA: Diagnosis not present

## 2020-04-04 DIAGNOSIS — Z8249 Family history of ischemic heart disease and other diseases of the circulatory system: Secondary | ICD-10-CM | POA: Insufficient documentation

## 2020-04-04 DIAGNOSIS — Z888 Allergy status to other drugs, medicaments and biological substances status: Secondary | ICD-10-CM | POA: Insufficient documentation

## 2020-04-04 DIAGNOSIS — Z87891 Personal history of nicotine dependence: Secondary | ICD-10-CM | POA: Insufficient documentation

## 2020-04-04 DIAGNOSIS — Z8673 Personal history of transient ischemic attack (TIA), and cerebral infarction without residual deficits: Secondary | ICD-10-CM | POA: Insufficient documentation

## 2020-04-04 NOTE — Discharge Instructions (Signed)
Continue taking your Lasix as originally prescribed for the next few days. If not improving, you will need to be seen in the Emergency Department if your primary provider is not available.

## 2020-04-04 NOTE — Telephone Encounter (Signed)
Spoke w/ pt, she states she has been short of breath for appr 2 weeks, she is tired of it and worried, stated she was just calling to let dr Evette Doffing know she was going to ED. States she thinks it fluid building up and until last few days she had been taking less of her furosemide than prescribed because " I cant lie I been playing doctor and I shouldn't have". Denies weight gain, feeling "bloated", edema of extremities.  Triage reviewed clinic schedules, [facility] full. Ask her to go to urg care here at [facility] and she is agreeable

## 2020-04-04 NOTE — Telephone Encounter (Signed)
Pt needs f/u from urg care appt asap

## 2020-04-04 NOTE — ED Triage Notes (Signed)
Pt c/o SOBx1 wk. Pt is on 4L 02 at baseline. Pt has diminished RLL. The rest of the lungs are clear. Pt has non labored breathing. Pt c/o chest pressure. Pt skin color WNL.

## 2020-04-05 LAB — SARS CORONAVIRUS 2 (TAT 6-24 HRS): SARS Coronavirus 2: NEGATIVE
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
## 2020-04-10 NOTE — Telephone Encounter (Signed)
Spoke with the patient. Pt sch   for 04/11/2020 with [facility].

## 2020-04-11 ENCOUNTER — Ambulatory Visit (INDEPENDENT_AMBULATORY_CARE_PROVIDER_SITE_OTHER): Payer: Medicare Other | Admitting: Internal Medicine

## 2020-04-11 ENCOUNTER — Encounter: Payer: Self-pay | Admitting: Internal Medicine

## 2020-04-11 ENCOUNTER — Other Ambulatory Visit: Payer: Self-pay

## 2020-04-11 DIAGNOSIS — Z0000 Encounter for general adult medical examination without abnormal findings: Secondary | ICD-10-CM | POA: Diagnosis not present

## 2020-04-11 DIAGNOSIS — I110 Hypertensive heart disease with heart failure: Secondary | ICD-10-CM | POA: Diagnosis not present

## 2020-04-11 DIAGNOSIS — I10 Essential (primary) hypertension: Secondary | ICD-10-CM

## 2020-04-11 DIAGNOSIS — I5042 Chronic combined systolic (congestive) and diastolic (congestive) heart failure: Secondary | ICD-10-CM

## 2020-04-11 DIAGNOSIS — Z79899 Other long term (current) drug therapy: Secondary | ICD-10-CM | POA: Diagnosis not present

## 2020-04-11 NOTE — Assessment & Plan Note (Signed)
Patient encouraged her for COVID-19 vaccine encouraged to schedule at [facility].  Patient expresses understanding.

## 2020-04-11 NOTE — Patient Instructions (Addendum)
Ms. Golembeski,   It was a pleasure seeing you in clinic. Today we discussed:  Shortness of breath: I'm glad that this is improved. Please continue to take your Lasix 120mg  twice daily as prescribed. Please let us know if you have any side effects from this. Also please let us know if your shortness of breath worsens or you notice leg swelling or >2lb weight gain in 24 hours.   COVID vaccine: We recommend you get the Spray or Moderna vaccine at your earliest convenience. You may call your local [facility] to schedule an appointment for this.   Please contact us if you have any questions or concerns.  Thank you!

## 2020-04-11 NOTE — Progress Notes (Signed)
CC: f/u urgent care visit for shortness of breath  HPI:  [name] is a 64 y.o. female with PMHx as listed below presenting for urgent care f/u of shortness of breath. Please see encounters tab for problem based assessment and plan.  Past Medical History:  Diagnosis Date  . Abscess of skin of abdomen 08/31/2018  . Acquired lactose intolerance 09/24/2017  . 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  Non-functioning  . Aortic atherosclerosis ([place]) 09/24/2017   Asymptomatic, found on CT scan  . Blood transfusion without reported diagnosis   . Chronic Systolic Heart Failure 123456   Felt to be non-ischemic and secondary to hypertension.  Echo (05/28/2014): LVEF 25%.  Is not interested in AICD placement.  Marland Kitchen COPD exacerbation ([place])   . Coronary artery disease involving native coronary artery of native heart without angina pectoris 11/09/2014   Cardiac cath (02/18/2014): Non-obstructive, mRCA 30%, dRCA 60%  . 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 Diverticulosis of colon 09/24/2017  . Diverticulosis of colon 09/24/2017  . Essential hypertension 10/15/2013  . Gastroesophageal reflux disease 09/24/2017  . History of cerebrovascular accident 05/10/2013   Per patient report 6 previous strokes, most recent on 05/10/13.  No residual deficits.  . History of cerebrovascular accident 05/10/2013   Per patient report 6 previous strokes, most recent on 05/10/13.  No residual deficits.  . Hyperlipidemia   . Overweight (BMI 25.0-29.9) 09/24/2017  . Psoriasis 09/24/2017  . Seasonal allergic rhinitis due to pollen 09/24/2017   Spring and early Fall  . Small Bowel Obstruction (SBO) 01/13/2014   Ex-Lap & Lysis of Adhesion, 01/15/2014  . Thyroid nodule 09/04/2019  . Tobacco use disorder 01/15/2014  . Type 2 diabetes mellitus with moderate nonproliferative diabetic retinopathy ([place]) 10/15/2013    Review of Systems:  Negative except as stated in HPI.  Physical Exam:  Vitals:   04/11/20 0904  BP: (!) 152/60  Pulse: 68  Temp: 98.7 F (37.1 C)  TempSrc: Oral  SpO2: 98%  Weight: 228 lb 6.4 oz (103.6 kg)  Height: 5\' 7"  (1.702 m)   Physical Exam Constitutional:      General: She is not in acute distress.    Appearance: Normal appearance. She is not diaphoretic.  HENT:     Mouth/Throat:     Mouth: Mucous membranes are moist.     Pharynx: Oropharynx is clear. No oropharyngeal exudate or posterior oropharyngeal erythema.  Cardiovascular:     Rate and Rhythm: Normal rate and regular rhythm.     Pulses: Normal pulses.     Heart sounds: Normal heart sounds. No friction rub. No gallop.   Pulmonary:     Effort: Pulmonary effort is normal. No respiratory distress.     Breath sounds: Normal breath sounds. No wheezing, rhonchi or rales.  Abdominal:     General: Bowel sounds are normal.     Palpations: Abdomen is soft.  Musculoskeletal:        General: No swelling.     Right lower leg: No edema.     Left lower leg: No edema.  Skin:    General: Skin is warm and dry.     Capillary Refill: Capillary refill takes less than 2 seconds.  Neurological:     General: No focal deficit present.     Mental Status: She is alert and oriented to person, place, and  time. Mental status is at baseline.     Sensory: No sensory deficit.     Motor: No weakness.      Assessment & Plan:   See Encounters Tab for problem based charting.  Patient discussed with Dr. Evette Doffing

## 2020-04-11 NOTE — Assessment & Plan Note (Signed)
Patient presenting for follow up of recent urgent care visit for shortness of breath. Patient has history of combined heart failure (EF 50% in 2019) with grade II diastolic dysfunction. She is on Lasix 120mg  twice daily and endorses decreasing her dose one week prior to symptom onset as she was having some leg cramps.  Patient was seen in urgent care on 5/13 and noted to have mild pulmonary edema on chest x-ray for which she was resumed on her home dose of Lasix.  Patient notes improvement in her symptoms since last week.  She is back to functional status.  Although her weight is at 228 today (baseline 222-223lbs), no signs of volume overload at this time.  We will continue to monitor.  Plan Continue Lasix 120 mg twice daily Continue potassium 20 mEq twice daily Follow-up with PCP in 4 weeks

## 2020-04-11 NOTE — Progress Notes (Signed)
Internal Medicine Clinic Attending  I saw and evaluated the patient.  I personally confirmed the key portions of the history and exam documented by Dr. Aslam and I reviewed pertinent patient test results.  The assessment, diagnosis, and plan were formulated together and I agree with the documentation in the resident's note.     

## 2020-04-11 NOTE — Assessment & Plan Note (Signed)
BP 152/60 today. She is on Entresto and Amlodipine and notes compliance with medications. No sequelae reported. Suspect this may be in setting of patient's self adjustment of lasix dose. Will continue to monitor at this time. No changes in medication today.

## 2020-04-18 ENCOUNTER — Other Ambulatory Visit: Payer: Self-pay | Admitting: Internal Medicine

## 2020-04-18 DIAGNOSIS — J9611 Chronic respiratory failure with hypoxia: Secondary | ICD-10-CM | POA: Diagnosis not present

## 2020-04-18 NOTE — Telephone Encounter (Signed)
Next appt scheduled 6/21 with PCP.

## 2020-04-29 ENCOUNTER — Ambulatory Visit
Admission: RE | Admit: 2020-04-29 | Discharge: 2020-04-29 | Disposition: A | Discharge status: Home / Self Care | Payer: Medicare Other | Source: Ambulatory Visit | Attending: Family Medicine | Admitting: Family Medicine

## 2020-04-29 ENCOUNTER — Other Ambulatory Visit: Payer: Self-pay

## 2020-04-29 DIAGNOSIS — Z1231 Encounter for screening mammogram for malignant neoplasm of breast: Secondary | ICD-10-CM

## 2020-05-02 ENCOUNTER — Other Ambulatory Visit: Payer: Self-pay

## 2020-05-02 ENCOUNTER — Encounter: Payer: Self-pay | Admitting: *Deleted

## 2020-05-02 ENCOUNTER — Encounter: Payer: Self-pay | Admitting: Cardiology

## 2020-05-02 ENCOUNTER — Ambulatory Visit (INDEPENDENT_AMBULATORY_CARE_PROVIDER_SITE_OTHER): Payer: Medicare Other | Admitting: Cardiology

## 2020-05-02 VITALS — BP 130/70 | HR 75 | Ht 67.0 in | Wt 223.8 lb

## 2020-05-02 DIAGNOSIS — R9431 Abnormal electrocardiogram [ECG] [EKG]: Secondary | ICD-10-CM | POA: Diagnosis not present

## 2020-05-02 DIAGNOSIS — I428 Other cardiomyopathies: Secondary | ICD-10-CM

## 2020-05-02 DIAGNOSIS — E119 Type 2 diabetes mellitus without complications: Secondary | ICD-10-CM

## 2020-05-02 DIAGNOSIS — I10 Essential (primary) hypertension: Secondary | ICD-10-CM

## 2020-05-02 DIAGNOSIS — I5042 Chronic combined systolic (congestive) and diastolic (congestive) heart failure: Secondary | ICD-10-CM | POA: Diagnosis not present

## 2020-05-02 DIAGNOSIS — R55 Syncope and collapse: Secondary | ICD-10-CM | POA: Diagnosis not present

## 2020-05-02 NOTE — Patient Instructions (Signed)
Medication Instructions:  The current medical regimen is effective;  continue present plan and medications.  *If you need a refill on your cardiac medications before your next appointment, please call your pharmacy*  Testing/Procedures: ZIO XT- Long Term Monitor Instructions   Your physician has requested you wear your ZIO patch monitor 14 days.   This is a single patch monitor.  [employer] supplies one patch monitor per enrollment.  Additional stickers are not available.   Please do not apply patch if you will be having a Nuclear Stress Test, Echocardiogram, Cardiac CT, MRI, or Chest Xray during the time frame you would be wearing the monitor. The patch cannot be worn during these tests.  You cannot remove and re-apply the ZIO XT patch monitor.   Your ZIO patch monitor will be sent USPS Priority mail from [employer] directly to your home address. The monitor may also be mailed to a PO BOX if home delivery is not available.   It may take 3-5 days to receive your monitor after you have been enrolled.   Once you have received you monitor, please review enclosed instructions.  Your monitor has already been registered assigning a specific monitor serial # to you.   Applying the monitor   Shave hair from upper left chest.   Hold abrader disc by orange tab.  Rub abrader in 40 strokes over left upper chest as indicated in your monitor instructions.   Clean area with 4 enclosed alcohol pads .  Use all pads to assure are is cleaned thoroughly.  Let dry.   Apply patch as indicated in monitor instructions.  Patch will be place under collarbone on left side of chest with arrow pointing upward.   Rub patch adhesive wings for 2 minutes.Remove white label marked "1".  Remove white label marked "2".  Rub patch adhesive wings for 2 additional minutes.   While looking in a mirror, press and release button in center of patch.  A small green light will flash 3-4 times .  This will be your only  indicator the monitor has been turned on.     Do not shower for the first 24 hours.  You may shower after the first 24 hours.   Press button if you feel a symptom. You will hear a small click.  Record Date, Time and Symptom in the Patient Log Book.   When you are ready to remove patch, follow instructions on last 2 pages of Patient Log Book.  Stick patch monitor onto last page of Patient Log Book.   Place Patient Log Book in Blue box.  Use locking tab on box and tape box closed securely.  The Orange and White box has prepaid postage on it.  Please place in mailbox as soon as possible.  Your physician should have your test results approximately 7 days after the monitor has been mailed back to [employer].   Call [employer] Customer Care at [phone] if you have questions regarding your ZIO XT patch monitor.  Call them immediately if you see an orange light blinking on your monitor.   If your monitor falls off in less than 4 days contact our Monitor department at [phone].  If your monitor becomes loose or falls off after 4 days call [employer] at [phone] for suggestions on securing your monitor.   Follow-Up: At [facility], you and your health needs are our priority.  As part of our continuing mission to provide you with exceptional heart care, we have   created designated Provider Care Teams.  These Care Teams include your primary Cardiologist (physician) and Advanced Practice Providers (APPs -  Physician Assistants and Nurse Practitioners) who all work together to provide you with the care you need, when you need it.  We recommend signing up for the patient portal called "MyChart".  Sign up information is provided on this After Visit Summary.  MyChart is used to connect with patients for Virtual Visits (Telemedicine).  Patients are able to view lab/test results, encounter notes, upcoming appointments, etc.  Non-urgent messages can be sent to your provider as well.   To learn  more about what you can do with MyChart, go to [email or web address].    Your next appointment:   6 month(s)  The format for your next appointment:   In Person  Provider:   Mark Skains, MD   Thank you for choosing [facility]!!      
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
## 2020-05-02 NOTE — Progress Notes (Signed)
Patient ID: Alicia [name], female   DOB: Sep 25, 1956, 64 y.o.   MRN: [id number] Patient enrolled for [employer] to ship a 14 day ZIO XT long term holter monitor to her home.

## 2020-05-06 ENCOUNTER — Other Ambulatory Visit (INDEPENDENT_AMBULATORY_CARE_PROVIDER_SITE_OTHER): Payer: Medicare Other

## 2020-05-06 DIAGNOSIS — R55 Syncope and collapse: Secondary | ICD-10-CM

## 2020-05-06 DIAGNOSIS — R9431 Abnormal electrocardiogram [ECG] [EKG]: Secondary | ICD-10-CM

## 2020-05-13 ENCOUNTER — Ambulatory Visit (INDEPENDENT_AMBULATORY_CARE_PROVIDER_SITE_OTHER): Payer: Medicare Other | Admitting: Student in an Organized Health Care Education/Training Program

## 2020-05-13 ENCOUNTER — Encounter: Payer: Self-pay | Admitting: Student in an Organized Health Care Education/Training Program

## 2020-05-13 VITALS — BP 145/71 | HR 63 | Temp 98.4°F | Ht 67.0 in | Wt 227.4 lb

## 2020-05-13 DIAGNOSIS — E113393 Type 2 diabetes mellitus with moderate nonproliferative diabetic retinopathy without macular edema, bilateral: Secondary | ICD-10-CM | POA: Diagnosis not present

## 2020-05-13 DIAGNOSIS — Z122 Encounter for screening for malignant neoplasm of respiratory organs: Secondary | ICD-10-CM | POA: Diagnosis not present

## 2020-05-13 DIAGNOSIS — F17200 Nicotine dependence, unspecified, uncomplicated: Secondary | ICD-10-CM

## 2020-05-13 DIAGNOSIS — I10 Essential (primary) hypertension: Secondary | ICD-10-CM | POA: Diagnosis not present

## 2020-05-13 DIAGNOSIS — Z9981 Dependence on supplemental oxygen: Secondary | ICD-10-CM

## 2020-05-13 DIAGNOSIS — J9611 Chronic respiratory failure with hypoxia: Secondary | ICD-10-CM

## 2020-05-13 DIAGNOSIS — I5042 Chronic combined systolic (congestive) and diastolic (congestive) heart failure: Secondary | ICD-10-CM | POA: Diagnosis not present

## 2020-05-13 LAB — POCT GLYCOSYLATED HEMOGLOBIN (HGB A1C): Hemoglobin A1C: 8 % — AB (ref 4.0–5.6)

## 2020-05-13 LAB — GLUCOSE, CAPILLARY: Glucose-Capillary: 151 mg/dL — ABNORMAL HIGH (ref 70–99)

## 2020-05-13 MED ORDER — FLUTICASONE-SALMETEROL 250-50 MCG/DOSE IN AEPB: 1.0000 | INHALATION_SPRAY | Freq: Every day | RESPIRATORY_TRACT | 3 refills | Status: DC

## 2020-05-13 MED ORDER — ALBUTEROL SULFATE HFA 108 (90 BASE) MCG/ACT IN AERS: 1.0000 | INHALATION_SPRAY | Freq: Four times a day (QID) | RESPIRATORY_TRACT | 3 refills | Status: DC | PRN

## 2020-05-13 NOTE — Progress Notes (Signed)
   Assessment and Plan:  See Encounters tab for problem-based medical decision making.   __________________________________________________________  HPI:   64 year old person here for follow up of diabetes. She is doing well at home. Heart failure symptoms are under good control. Good adherence to her medications including insulin. Having some local pain at the injection sites, but is rotating them appropriately. Not interested in covid vaccine at this point, very sensitive to vaccine side effects in the past. Uses supplemental oxygen consistently which gives symptomatic benefit. Has been many years since used an inhaler.   __________________________________________________________  Problem List: Patient Active Problem List   Diagnosis Date Noted  . Chronic respiratory failure with hypoxia, on home oxygen therapy ([place]) 10/14/2018    Priority: High  . Coronary artery disease involving native coronary artery of native heart without angina pectoris 11/09/2014    Priority: High  . Chronic combined systolic and diastolic heart failure ([place]) 05/10/2014    Priority: High  . Tobacco use disorder 01/15/2014    Priority: High  . Type 2 diabetes mellitus with moderate nonproliferative diabetic retinopathy ([place]) 10/15/2013    Priority: High  . Essential hypertension 10/15/2013    Priority: High  . Sinoatrial node dysfunction (HCC) 10/17/2018    Priority: Medium  . Aortic atherosclerosis ([place]) 09/24/2017    Priority: Medium  . Gastroesophageal reflux disease 09/24/2017    Priority: Medium  . Obesity (BMI 30.0-34.9) 09/24/2017    Priority: Medium  . Hyperlipidemia     Priority: Medium  . Nocturnal hypoxemia 12/30/2018    Priority: Low  . Psoriasis 09/24/2017    Priority: Low  . 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 Seasonal allergic rhinitis due to pollen 09/24/2017    Priority: Low  . Healthcare maintenance 09/24/2017    Priority: Low  .  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 Encounter for screening for lung cancer 05/13/2020    Medications: Reconciled today in Epic __________________________________________________________  Physical Exam:  Vital Signs: Vitals:   05/13/20 0908 05/13/20 1046  BP: (!) 145/71   Pulse: 63   Temp: 98.4 F (36.9 C)   TempSrc: Oral   SpO2: (!) 88% 94%  Weight: 227 lb 6.4 oz (103.1 kg)   Height: 5\' 7"  (1.702 m)     Gen: Well appearing, NAD CV: RRR, no murmurs Pulm: Normal effort, CTA throughout, no wheezing Abd: Soft, NT, ND.   Ext: Warm, no edema, normal joints Skin: No atypical appearing moles. No rashes

## 2020-05-13 NOTE — Assessment & Plan Note (Signed)
Stable chronic hypoxic respiratory failure likely multifactorial in etiology. I reviewed PFTs from 2019, not consistent with COPD. More suggestive of restrictive disease given reduced FEV1 and FVC, normal ratio, reduced DLCO. Also very reduced ERV and chronic hypercarbia which suggests obesity hypoventilation syndrome. She reports a history of asthma, no confirmatory testing available, she did have brisk response of 25-75 flow to bronchodilator. I am going to try adding intermediate dose Advair and as needed albuterol as trial to see if that helps her dyspnea symptoms. If not, may be best to repeat PFTs. I doubt ILD as she has no crackles on exam today and has shown stability over several years.

## 2020-05-13 NOTE — Patient Instructions (Signed)
It was great seeing you today in clinic.   We will start a new inhaler called Advair, use this 1 time every day.  I also prescribed an albuterol rescue inhaler.  Shake this inhaler up well, then inhale if needed for shortness of breath.  You do not need any blood work today.  We talked about lung cancer screening using a low-dose CT scan which I have ordered.

## 2020-05-13 NOTE — Assessment & Plan Note (Signed)
Appears euvolemic on exam today and well compensated. Plan to continue with Entresto 200 bid. No beta blocker due to sinus pauses and sinus node dysfunction. Continue with lasix 120mg  bid. Greatly appreciate cardiology consultation on this issue.

## 2020-05-13 NOTE — Assessment & Plan Note (Signed)
Quit smoking in November 2020. I congratulated her on this positive change. Will continue to monitor.

## 2020-05-13 NOTE — Assessment & Plan Note (Signed)
Hgb A1c is at a reasonable goal fo 8.0% today. This has trended down nicely from 11% one year ago. Plan to continue with Trulicity weekly, lantus 50 nightly, and humalog 20 with meals. May benefit from SGLT2 inhibitor in the future due to comorbid CHF. But both these conditions seem to be well controlled for now so no changes necessary today.

## 2020-05-13 NOTE — Assessment & Plan Note (Signed)
Shared Decision Making: Past smoker (1 ppd for 40 years) with approximately a 40 pack year history. Quit in 2020. The patient meets USPTF recommendations for lung cancer screening with low dose CT in persons age 62 - 37 yrs with a 30 pack year history who are currently smoking or have quit in the past 15 yrs. We have discussed the risks and benefits of screening, including but not limited to the following. Lung cancer screening might detect about one half of lung cancer at an early, curative stage. For the average screened person, the risk of dying from lung cancer will decrease from 1.7% to 1.4% with screening. About 1 in 4 screened patients will have a false positive result possibly leading to more radiation exposure, cost, anxiety, and / or invasive procedures. 95% of false positives will not lead to a diagnosis of cancer. After discussion, Alicia [name] has decided to pursue lung cancer screening.

## 2020-05-19 DIAGNOSIS — J9611 Chronic respiratory failure with hypoxia: Secondary | ICD-10-CM | POA: Diagnosis not present

## 2020-05-23 ENCOUNTER — Telehealth: Payer: Self-pay | Admitting: Student in an Organized Health Care Education/Training Program

## 2020-05-23 NOTE — Telephone Encounter (Signed)
rtc to pt, someone answered and stated "oh Lord" and never said another word, waited 2 mins and hung up

## 2020-05-23 NOTE — Telephone Encounter (Signed)
RTC, informed patient RN did not see any notes in her chart where clinic staff was trying to get in touch with her today.  RN did make note to patient she had an upcoming appt on 06/12/20 for a CT Chest Lung Ca Screening, patient states she was aware MD wanted her to have this, but did not know when it would be scheduled.  Patient states she will now have her phone on her. SChaplin, RN,BSN

## 2020-05-23 NOTE — Telephone Encounter (Signed)
Pt missed a call no message was left, unsure who call; pls contact [phone]

## 2020-05-28 NOTE — Telephone Encounter (Signed)
Please see notes below on documented on Referral for 05/23/2020 as the patient was called.  Called patient no answer.  Mailed appt to the patient for the following:   Wed 06/12/20 03:30 PM [facility]-CT IMAGING [facility]-CT 2 CT CHEST LUNG CANCER SCREENING      Called patient again this morning and no answer.  [facility] for the patient to contact Radiology @ [phone].  Please direct patient to the number listed if she is unable to keep her appointment.

## 2020-06-06 DIAGNOSIS — R55 Syncope and collapse: Secondary | ICD-10-CM | POA: Diagnosis not present

## 2020-06-06 DIAGNOSIS — R9431 Abnormal electrocardiogram [ECG] [EKG]: Secondary | ICD-10-CM | POA: Diagnosis not present

## 2020-06-10 ENCOUNTER — Other Ambulatory Visit: Payer: Self-pay | Admitting: Student in an Organized Health Care Education/Training Program

## 2020-06-12 ENCOUNTER — Ambulatory Visit (HOSPITAL_COMMUNITY)
Admission: RE | Admit: 2020-06-12 | Discharge: 2020-06-12 | Disposition: A | Discharge status: Home / Self Care | Payer: Medicare Other | Source: Ambulatory Visit | Attending: Student in an Organized Health Care Education/Training Program | Admitting: Student in an Organized Health Care Education/Training Program

## 2020-06-12 ENCOUNTER — Other Ambulatory Visit: Payer: Self-pay

## 2020-06-12 DIAGNOSIS — Z87891 Personal history of nicotine dependence: Secondary | ICD-10-CM | POA: Diagnosis not present

## 2020-06-12 DIAGNOSIS — J439 Emphysema, unspecified: Secondary | ICD-10-CM | POA: Insufficient documentation

## 2020-06-12 DIAGNOSIS — I700 Atherosclerosis of aorta: Secondary | ICD-10-CM | POA: Diagnosis not present

## 2020-06-12 DIAGNOSIS — Z122 Encounter for screening for malignant neoplasm of respiratory organs: Secondary | ICD-10-CM | POA: Insufficient documentation

## 2020-06-17 ENCOUNTER — Telehealth: Payer: Self-pay | Admitting: Student in an Organized Health Care Education/Training Program

## 2020-06-17 NOTE — Telephone Encounter (Signed)
Call the patient and gave her the results of the CT chest for lung cancer screening.  Lung RADS 1.  Plan is for repeat CT in 1 year.  She was worried about these results and so very grateful to hear that everything looked okay.  Does report acute illness over the last few days, some swollen eyelids, wonders if she has a sinus infection.  I asked her to call the clinic to schedule an appointment for a in person evaluation.

## 2020-06-18 ENCOUNTER — Other Ambulatory Visit: Payer: Self-pay

## 2020-06-18 ENCOUNTER — Ambulatory Visit (INDEPENDENT_AMBULATORY_CARE_PROVIDER_SITE_OTHER): Payer: Medicare Other | Admitting: Internal Medicine

## 2020-06-18 DIAGNOSIS — H1013 Acute atopic conjunctivitis, bilateral: Secondary | ICD-10-CM | POA: Diagnosis not present

## 2020-06-18 DIAGNOSIS — J301 Allergic rhinitis due to pollen: Secondary | ICD-10-CM | POA: Diagnosis not present

## 2020-06-18 DIAGNOSIS — J9611 Chronic respiratory failure with hypoxia: Secondary | ICD-10-CM | POA: Diagnosis not present

## 2020-06-18 MED ORDER — OLOPATADINE HCL 0.1 % OP SOLN: 1.0000 [drp] | Freq: Two times a day (BID) | OPHTHALMIC | 1 refills | Status: DC

## 2020-06-18 NOTE — Patient Instructions (Addendum)
Thank you for allowing [name] to provide your care today. Today we discussed your itchy eyes    I have ordered no labs for you. I will call if any are abnormal.    Today we made the following changes to your medications.    Please follow-up as needed.    Should you have any questions or concerns please call the internal medicine clinic at [phone].    Olopatadine eye solution What is this medicine? OLOPATADINE (oh loe pa TA deen) drops are used in the eye to treat symptoms caused by allergies. This medicine may be used for other purposes; ask your health care provider or pharmacist if you have questions. COMMON BRAND NAME(S): Pataday, Patanol, Pazeo What should I tell my health care provider before I take this medicine? They need to know if you have any of these conditions:  wear contact lenses  an unusual or allergic reaction to olopatadine, other medicines, foods, dyes, or preservatives  pregnant or trying to get pregnant  breast-feeding How should I use this medicine? This medicine is only for use in the eye. Do not take by mouth. Follow the directions on the prescription label. Wash hands before and after use. Tilt the head back slightly and pull down the lower lid with the index finger to form a pouch. Try not to touch the tip of the dropper to your eye, fingertips, or any other surface. Place the prescribed number of drops into the pouch. Close the eye gently to spread the drops. Your vision may blur for a few minutes. Use your doses at regular intervals. Do not use your medicine more often than directed. Talk to your pediatrician regarding the use of this medicine in children. While this drug may be prescribed for children as young as 91 years of age for selected conditions, precautions do apply. Overdosage: If you think you have taken too much of this medicine contact a poison control center or emergency room at once. NOTE: This medicine is only for you. Do not share this medicine  with others. What if I miss a dose? If you miss a dose, use it as soon as you can. If it is almost time for your next dose, use only that dose. Do not use double or extra doses. What may interact with this medicine? Interactions are not expected. Do not use any other eye products without telling your doctor or health care professional. This list may not describe all possible interactions. Give your health care provider a list of all the medicines, herbs, non-prescription drugs, or dietary supplements you use. Also tell them if you smoke, drink alcohol, or use illegal drugs. Some items may interact with your medicine. What should I watch for while using this medicine? Tell your doctor or healthcare professional if your symptoms do not start to get better or if they get worse. Stop using this medicine if your eyes get swollen, painful, or have a discharge, and see your doctor or health care professional as soon as you can. Do not wear contact lenses if your eyes are red. This medicine should not be used to treat irritation that is caused by contact lenses. Remove soft contact lenses before using this medicine. Contact lenses may be inserted 10 minutes after putting the medicine in the eye. What side effects may I notice from receiving this medicine? Side effects that you should report to your doctor or health care professional as soon as possible:  allergic reactions like skin rash, itching or  hives, swelling of the face, lips, or tongue  eye pain, swelling, or increased redness Side effects that usually do not require medical attention (report to your doctor or health care professional if they continue or are bothersome):  burning, discomfort, stinging, or tearing immediately after use  dry eyes  headache  runny or stuffy nose This list may not describe all possible side effects. Call your doctor for medical advice about side effects. You may report side effects to FDA at  1-800-FDA-1088. Where should I keep my medicine? Keep out of reach of children. Store between 4 and 25 degrees C (39 and 77 degrees F). Keep container tightly closed when not in use. Protect from light. Throw away any unused medicine after the expiration date. NOTE: This sheet is a summary. It may not cover all possible information. If you have questions about this medicine, talk to your doctor, pharmacist, or health care provider.  2020 Elsevier/Gold Standard (2016-07-08 15:45:49)

## 2020-06-19 ENCOUNTER — Encounter: Payer: Self-pay | Admitting: Internal Medicine

## 2020-06-19 NOTE — Assessment & Plan Note (Signed)
Alicia [name] is a 64 yo F w/ PMH of T2DM, GERD, psoriasis, htn presenting to [facility] w/ complaint of eye itching. She mentions that she was in her usual state of health until couple weeks ago when she developed itchy eyes with swelling. She also has been endorsing sneezing and sinus congestion. Mentions history of similar symptoms due to allergies in the past. Took some OTC eye drops from dollar store, which she does not know the name of. Denies any vision changes, eye pain or discharge or injected conjuctivae.  A/P Eyelid pruritus with sinus symptoms consistent with allergic symptoms. Will send for anti-histamine eye drops and advised to take OTC anti-histamines (has Zyrtec at home)  - Start olopatadine 1 drop BID

## 2020-06-19 NOTE — Progress Notes (Signed)
   CC: Eyelid itching  HPI: [name] is a 64 y.o. with PMH listed below presenting with complaint of itchy eyelids. Please see problem based assessment and plan for further details.  Past Medical History:  Diagnosis Date  . Abscess of skin of abdomen 08/31/2018  . Acquired lactose intolerance 09/24/2017  . 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  Non-functioning  . Aortic atherosclerosis ([place]) 09/24/2017   Asymptomatic, found on CT scan  . Blood transfusion without reported diagnosis   . Chronic Systolic Heart Failure 3/55/7322   Felt to be non-ischemic and secondary to hypertension.  Echo (05/28/2014): LVEF 25%.  Is not interested in AICD placement.  Marland Kitchen COPD exacerbation ([place])   . Coronary artery disease involving native coronary artery of native heart without angina pectoris 11/09/2014   Cardiac cath (02/18/2014): Non-obstructive, mRCA 30%, dRCA 60%  . 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 Diverticulosis of colon 09/24/2017  . Diverticulosis of colon 09/24/2017  . Essential hypertension 10/15/2013  . Gastroesophageal reflux disease 09/24/2017  . History of cerebrovascular accident 05/10/2013   Per patient report 6 previous strokes, most recent on 05/10/13.  No residual deficits.  . History of cerebrovascular accident 05/10/2013   Per patient report 6 previous strokes, most recent on 05/10/13.  No residual deficits.  . Hyperlipidemia   . Overweight (BMI 25.0-29.9) 09/24/2017  . Psoriasis 09/24/2017  . Seasonal allergic rhinitis due to pollen 09/24/2017   Spring and early Fall  . Small Bowel Obstruction (SBO) 01/13/2014   Ex-Lap & Lysis of Adhesion, 01/15/2014  . Thyroid nodule 09/04/2019  . Tobacco use disorder 01/15/2014  . Type 2 diabetes mellitus with moderate nonproliferative diabetic retinopathy ([place]) 10/15/2013   Review of Systems: Review of Systems  Constitutional:  Negative for chills, fever and malaise/fatigue.  HENT: Positive for congestion.   Eyes: Negative for blurred vision, photophobia, pain and discharge.  Respiratory: Negative for shortness of breath.   Cardiovascular: Negative for chest pain.  Gastrointestinal: Negative for diarrhea, nausea and vomiting.  Skin: Positive for itching.  All other systems reviewed and are negative.   Physical Exam: There were no vitals filed for this visit.  Gen: Well-developed, well nourished, NAD HEENT: NCAT head, hearing intact, stable R eye strabismus, conjunctivae wnl, upper eyelid edema, [place] in place, swollen nasal turbinates CV: RRR, S1, S2 normal Pulm: Distant breath sounds, no rales, no wheezes Skin: Dry, Warm, normal turgor, no wounds, no rashes  Assessment & Plan:   Seasonal allergic rhinitis due to pollen [name] is a 64 yo F w/ PMH of T2DM, GERD, psoriasis, htn presenting to [facility] w/ complaint of eye itching. She mentions that she was in her usual state of health until couple weeks ago when she developed itchy eyes with swelling. She also has been endorsing sneezing and sinus congestion. Mentions history of similar symptoms due to allergies in the past. Took some OTC eye drops from dollar store, which she does not know the name of. Denies any vision changes, eye pain or discharge or injected conjuctivae.  A/P Eyelid pruritus with sinus symptoms consistent with allergic symptoms. Will send for anti-histamine eye drops and advised to take OTC anti-histamines (has Zyrtec at home)  - Start olopatadine 1 drop BID    Patient discussed with Dr. Jimmye Norman  -Gilberto Better, [facility] Pager: [phone]

## 2020-06-20 NOTE — Progress Notes (Signed)
Internal Medicine Clinic Attending  Case discussed with Dr. Lee  At the time of the visit.  We reviewed the resident's history and exam and pertinent patient test results.  I agree with the assessment, diagnosis, and plan of care documented in the resident's note.    

## 2020-07-12 ENCOUNTER — Other Ambulatory Visit: Payer: Self-pay | Admitting: Student in an Organized Health Care Education/Training Program

## 2020-07-13 ENCOUNTER — Other Ambulatory Visit: Payer: Self-pay | Admitting: Student in an Organized Health Care Education/Training Program

## 2020-07-13 DIAGNOSIS — E113393 Type 2 diabetes mellitus with moderate nonproliferative diabetic retinopathy without macular edema, bilateral: Secondary | ICD-10-CM

## 2020-07-17 ENCOUNTER — Other Ambulatory Visit: Payer: Self-pay | Admitting: Student in an Organized Health Care Education/Training Program

## 2020-07-17 DIAGNOSIS — E113393 Type 2 diabetes mellitus with moderate nonproliferative diabetic retinopathy without macular edema, bilateral: Secondary | ICD-10-CM

## 2020-07-17 DIAGNOSIS — I10 Essential (primary) hypertension: Secondary | ICD-10-CM

## 2020-07-17 MED ORDER — LANTUS SOLOSTAR 100 UNIT/ML ~~LOC~~ SOPN: PEN_INJECTOR | SUBCUTANEOUS | 3 refills | Status: DC

## 2020-07-17 MED ORDER — SACUBITRIL-VALSARTAN 97-103 MG PO TABS: 1.0000 | ORAL_TABLET | Freq: Two times a day (BID) | ORAL | 3 refills | Status: DC

## 2020-07-17 MED ORDER — AMLODIPINE BESYLATE 10 MG PO TABS: 10.0000 mg | ORAL_TABLET | Freq: Every day | ORAL | 3 refills | Status: DC

## 2020-07-17 MED ORDER — ATORVASTATIN CALCIUM 40 MG PO TABS: 40.0000 mg | ORAL_TABLET | Freq: Every day | ORAL | 3 refills | Status: DC

## 2020-07-17 NOTE — Telephone Encounter (Signed)
Patient has refills on all meds except amlodipine, atorvastatin, lantus, and Entresto. Call placed to patient. She will call pharmacy for all other meds to be refilled. States she has plenty of pen needles, lancets, and insulin syringes. Hubbard Hartshorn, BSN, RN-BC

## 2020-07-17 NOTE — Telephone Encounter (Signed)
Need refills on all your medicine [phone]

## 2020-07-19 DIAGNOSIS — J9611 Chronic respiratory failure with hypoxia: Secondary | ICD-10-CM | POA: Diagnosis not present

## 2020-08-05 ENCOUNTER — Other Ambulatory Visit: Payer: Self-pay

## 2020-08-05 ENCOUNTER — Encounter: Payer: Self-pay | Admitting: Student in an Organized Health Care Education/Training Program

## 2020-08-05 ENCOUNTER — Ambulatory Visit (INDEPENDENT_AMBULATORY_CARE_PROVIDER_SITE_OTHER): Payer: Medicare Other | Admitting: Student in an Organized Health Care Education/Training Program

## 2020-08-05 VITALS — BP 162/74 | HR 60 | Temp 98.1°F | Ht 67.0 in | Wt 229.3 lb

## 2020-08-05 DIAGNOSIS — I700 Atherosclerosis of aorta: Secondary | ICD-10-CM

## 2020-08-05 DIAGNOSIS — J9611 Chronic respiratory failure with hypoxia: Secondary | ICD-10-CM

## 2020-08-05 DIAGNOSIS — I110 Hypertensive heart disease with heart failure: Secondary | ICD-10-CM | POA: Diagnosis not present

## 2020-08-05 DIAGNOSIS — I10 Essential (primary) hypertension: Secondary | ICD-10-CM

## 2020-08-05 DIAGNOSIS — E113393 Type 2 diabetes mellitus with moderate nonproliferative diabetic retinopathy without macular edema, bilateral: Secondary | ICD-10-CM

## 2020-08-05 DIAGNOSIS — Z9981 Dependence on supplemental oxygen: Secondary | ICD-10-CM

## 2020-08-05 DIAGNOSIS — I5042 Chronic combined systolic (congestive) and diastolic (congestive) heart failure: Secondary | ICD-10-CM

## 2020-08-05 LAB — POCT GLYCOSYLATED HEMOGLOBIN (HGB A1C): Hemoglobin A1C: 8.2 % — AB (ref 4.0–5.6)

## 2020-08-05 LAB — GLUCOSE, CAPILLARY: Glucose-Capillary: 147 mg/dL — ABNORMAL HIGH (ref 70–99)

## 2020-08-05 MED ORDER — LANTUS SOLOSTAR 100 UNIT/ML ~~LOC~~ SOPN: 45.0000 [IU] | PEN_INJECTOR | Freq: Every day | SUBCUTANEOUS | 3 refills | Status: DC

## 2020-08-05 MED ORDER — FUROSEMIDE 40 MG PO TABS
80.0000 mg | ORAL_TABLET | Freq: Two times a day (BID) | ORAL | 3 refills | Status: DC
Start: 2020-08-05 — End: 2021-09-15

## 2020-08-05 MED ORDER — DAPAGLIFLOZIN PROPANEDIOL 5 MG PO TABS: 5.0000 mg | ORAL_TABLET | Freq: Every day | ORAL | 5 refills | Status: DC

## 2020-08-05 NOTE — Assessment & Plan Note (Signed)
Repeat blood pressure 146/69.  Systolic pressure is higher than our goal, she has a wide pulse pressure likely due to advanced age.  Plan is to continue with amlodipine 10, Entresto 200.  We are going to add [place] which may also give some blood pressure lowering benefits.

## 2020-08-05 NOTE — Assessment & Plan Note (Signed)
Chronic hypoxic and hypercarbic respiratory failure due to multiple comorbidities.  Tolerating Wixela inhaler well on a daily basis.  Using albuterol infrequently.  Doing well with supplemental oxygen throughout the day, has a portable tank and concentrator at home.  Wears nocturnal oxygen well.  Encouraged more exercise to improve deconditioning.  Also talked about weight loss strategies to improve component of obesity hypoventilation.

## 2020-08-05 NOTE — Assessment & Plan Note (Signed)
Heart failure with previously reduced ejection fraction.  Appears to be mostly euvolemic, slight lower extremity edema.  She decreased her Lasix dosing from 120 mg twice daily down to 80 mg twice daily due to frequency with urination.  Symptomatically doing well at this dose, weight is about stable as well.  Okay to continue with this dose of Lasix.  May get some extra diuretic effect from the [place] which we are starting today.

## 2020-08-05 NOTE — Assessment & Plan Note (Signed)
Hemoglobin A1c slightly above goal at 8.2%.  Also having some symptomatic hypoglycemia in the mornings.  Plan is to decrease Lantus to 45 units daily.  Continue with Humalog 16 units with meals.  Continue with Trulicity 1.5 mg weekly.  Were going to add dapagliflozin 5 mg once daily.  She tried canagliflozin back in 2019 but had intolerance that medication, said it caused palpitations and anxiety.  Hopefully she will be able to tolerate dapagliflozin, does not seem to have been an anaphylactic or type I allergic reaction so I think okay to try another SGLT2.

## 2020-08-05 NOTE — Patient Instructions (Signed)
Is great seeing you today in clinic.  I am so proud of you for taking the Covid vaccine.  We talked about your diabetes.  We decided to decrease your Lantus dose to 45 units in the evening.  We are going to start a new medication called Wilder Glade, take 1 tablet once daily.  I hope that you tolerate this well, call us if you have any problems with it.

## 2020-08-05 NOTE — Assessment & Plan Note (Signed)
Asymptomatic, incidental finding on CT.  Plan to treat medically with aspirin 81 mg daily and atorvastatin 40 mg daily.

## 2020-08-05 NOTE — Progress Notes (Signed)
   Assessment and Plan:  See Encounters tab for problem-based medical decision making.   __________________________________________________________  HPI:   64 year old person living with heart failure with previously reduced ejection fraction here for follow-up of diabetes.  Doing well recently, good exertional capacity.  Reports good independence in activities of daily living.  Reports good adherence with her medications with no adverse side effects.  One exception is that she was feeling like she was having too much frequent urination on 3 tablets of Lasix twice daily, so she decreased to 2 tablets twice daily.  Has noted good respiratory status, stable dyspnea on exertion, no orthopnea.  Uses supplemental oxygen while walking around and especially at night.  Started using new inhaler well, no recent cough or need for albuterol.  After a lot of consideration she finally accepted the Covid vaccine 2 weeks ago, due for the second dose next week.  __________________________________________________________  Problem List: Patient Active Problem List   Diagnosis Date Noted  . Chronic respiratory failure with hypoxia, on home oxygen therapy ([place]) 10/14/2018    Priority: High  . Coronary artery disease involving native coronary artery of native heart without angina pectoris 11/09/2014    Priority: High  . Chronic combined systolic and diastolic heart failure ([place]) 05/10/2014    Priority: High  . Tobacco use disorder 01/15/2014    Priority: High  . Type 2 diabetes mellitus with moderate nonproliferative diabetic retinopathy ([place]) 10/15/2013    Priority: High  . Essential hypertension 10/15/2013    Priority: High  . Sinoatrial node dysfunction (HCC) 10/17/2018    Priority: Medium  . Aortic atherosclerosis ([place]) 09/24/2017    Priority: Medium  . Gastroesophageal reflux disease 09/24/2017    Priority: Medium  . Obesity (BMI 30.0-34.9) 09/24/2017    Priority: Medium  . Hyperlipidemia      Priority: Medium  . Encounter for screening for lung cancer 05/13/2020    Priority: Low  . Nocturnal hypoxemia 12/30/2018    Priority: Low  . Psoriasis 09/24/2017    Priority: Low  . 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 Seasonal allergic rhinitis due to pollen 09/24/2017    Priority: Low  . Healthcare maintenance 09/24/2017    Priority: Low  . 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

## 2020-08-19 DIAGNOSIS — J9611 Chronic respiratory failure with hypoxia: Secondary | ICD-10-CM | POA: Diagnosis not present

## 2020-09-05 ENCOUNTER — Other Ambulatory Visit: Payer: Self-pay | Admitting: Student in an Organized Health Care Education/Training Program

## 2020-09-05 DIAGNOSIS — J9611 Chronic respiratory failure with hypoxia: Secondary | ICD-10-CM

## 2020-09-05 DIAGNOSIS — Z9981 Dependence on supplemental oxygen: Secondary | ICD-10-CM

## 2020-09-06 ENCOUNTER — Telehealth: Payer: Self-pay | Admitting: Student in an Organized Health Care Education/Training Program

## 2020-09-06 NOTE — Telephone Encounter (Signed)
Pt arm is feeling weak since COVID vaccine [phone]

## 2020-09-06 NOTE — Telephone Encounter (Signed)
Thank you. I agree. Hopefully it is just some transient fatigue and inflammation. Probably not related to the vaccine 2 weeks out though.

## 2020-09-06 NOTE — Telephone Encounter (Signed)
RTC, patient states she received her 2nd covid vaccine 2 weeks ago and has noticed some bilateral arm and leg weakness.  Patient also states 1 week prior to 2nd covid vaccination, she experienced a terrible h/a and now feels this is when the arm and leg weakness started.  She denies any slurred speech, facial dropping, changes in LOC, chest pain, SOB at time of h/a and denies any of these symptoms now.   Pt offered an appt today for evaluation, she declines.  Appt offered next week, pt declines. Pt advised if she experiences  h/a, visual disturbances, changes in LOC, ongoing weakness/heaviness in limbs, facial dropping or slurred speech, to present to ED and she verbalized understanding. SChaplin, RN,BSN

## 2020-09-18 DIAGNOSIS — J9611 Chronic respiratory failure with hypoxia: Secondary | ICD-10-CM | POA: Diagnosis not present

## 2020-10-19 DIAGNOSIS — J9611 Chronic respiratory failure with hypoxia: Secondary | ICD-10-CM | POA: Diagnosis not present

## 2020-10-21 ENCOUNTER — Other Ambulatory Visit: Payer: Self-pay

## 2020-10-21 ENCOUNTER — Encounter (HOSPITAL_COMMUNITY): Payer: Self-pay

## 2020-10-21 ENCOUNTER — Ambulatory Visit (HOSPITAL_COMMUNITY)
Admission: EM | Admit: 2020-10-21 | Discharge: 2020-10-21 | Disposition: A | Discharge status: Home / Self Care | Payer: Medicare Other

## 2020-10-21 ENCOUNTER — Other Ambulatory Visit: Payer: Self-pay | Admitting: Internal Medicine

## 2020-10-21 DIAGNOSIS — H1013 Acute atopic conjunctivitis, bilateral: Secondary | ICD-10-CM

## 2020-10-21 NOTE — Telephone Encounter (Signed)
Pt calls and states she is having shortness of breath, chest pain, swelling of the legs, weakness, cough, h/a- she is advised to call 911, states her daughter is on the way and she will go to urg care then but will not go to ED because she doesn't want COVID. Triage is agreeable with urg care at this time, preferred ED but pt refuses greatly

## 2020-10-21 NOTE — ED Notes (Signed)
Pt reported her home O2  Device was low on power. Pt provided with O2 tank and O2 started at 4 liters nasal canula per Pt info. Pt waiting for DC. NO resp. Distress observed.

## 2020-10-21 NOTE — ED Triage Notes (Addendum)
Pt reports shortness of breath on exertion, swelling in legs breast and eye, cough, headache x 1 month; lightheaded when walking x 1 week. Pt is in 4L of oxygen, states specially at night.  Denies chest pain, fever.   Pt has a follow up visit with PCP on 10/28/2020

## 2020-10-28 ENCOUNTER — Encounter: Payer: Self-pay | Admitting: Student in an Organized Health Care Education/Training Program

## 2020-10-28 ENCOUNTER — Ambulatory Visit (INDEPENDENT_AMBULATORY_CARE_PROVIDER_SITE_OTHER): Payer: Medicare Other | Admitting: Student in an Organized Health Care Education/Training Program

## 2020-10-28 VITALS — BP 147/66 | HR 54 | Temp 98.2°F | Ht 67.0 in | Wt 235.4 lb

## 2020-10-28 DIAGNOSIS — H6121 Impacted cerumen, right ear: Secondary | ICD-10-CM | POA: Diagnosis not present

## 2020-10-28 DIAGNOSIS — M7989 Other specified soft tissue disorders: Secondary | ICD-10-CM

## 2020-10-28 DIAGNOSIS — I5042 Chronic combined systolic (congestive) and diastolic (congestive) heart failure: Secondary | ICD-10-CM

## 2020-10-28 DIAGNOSIS — H6120 Impacted cerumen, unspecified ear: Secondary | ICD-10-CM | POA: Insufficient documentation

## 2020-10-28 DIAGNOSIS — E113393 Type 2 diabetes mellitus with moderate nonproliferative diabetic retinopathy without macular edema, bilateral: Secondary | ICD-10-CM

## 2020-10-28 LAB — BASIC METABOLIC PANEL
Anion gap: 10 (ref 5–15)
BUN: 17 mg/dL (ref 8–23)
CO2: 33 mmol/L — ABNORMAL HIGH (ref 22–32)
Calcium: 9.2 mg/dL (ref 8.9–10.3)
Chloride: 93 mmol/L — ABNORMAL LOW (ref 98–111)
Creatinine, Ser: 0.91 mg/dL (ref 0.44–1.00)
GFR, Estimated: >60 mL/min (ref >60)
Glucose, Bld: 168 mg/dL — ABNORMAL HIGH (ref 70–99)
Potassium: 4.1 mmol/L (ref 3.5–5.1)
Sodium: 136 mmol/L (ref 135–145)

## 2020-10-28 LAB — POCT GLYCOSYLATED HEMOGLOBIN (HGB A1C): Hemoglobin A1C: 8.6 % — AB (ref 4.0–5.6)

## 2020-10-28 LAB — GLUCOSE, CAPILLARY: Glucose-Capillary: 170 mg/dL — ABNORMAL HIGH (ref 70–99)

## 2020-10-28 LAB — LIPID PANEL
Cholesterol: 172 mg/dL (ref 0–200)
HDL: 48 mg/dL (ref >40)
LDL Cholesterol: 102 mg/dL — ABNORMAL HIGH (ref 0–99)
Total CHOL/HDL Ratio: 3.6 RATIO
Triglycerides: 109 mg/dL (ref <150)
VLDL: 22 mg/dL (ref 0–40)

## 2020-10-28 LAB — D-DIMER, QUANTITATIVE: D-Dimer, Quant: 0.57 ug/mL-FEU — ABNORMAL HIGH (ref 0.00–0.50)

## 2020-10-28 LAB — BRAIN NATRIURETIC PEPTIDE: B Natriuretic Peptide: 488.4 pg/mL — ABNORMAL HIGH (ref 0.0–100.0)

## 2020-10-28 MED ORDER — INSULIN LISPRO (1 UNIT DIAL) 100 UNIT/ML (KWIKPEN): 15.0000 [IU] | PEN_INJECTOR | Freq: Three times a day (TID) | SUBCUTANEOUS | 11 refills | Status: DC

## 2020-10-28 MED ORDER — LANTUS SOLOSTAR 100 UNIT/ML ~~LOC~~ SOPN: 50.0000 [IU] | PEN_INJECTOR | Freq: Every day | SUBCUTANEOUS | 3 refills | Status: DC

## 2020-10-28 MED ORDER — INSULIN LISPRO (1 UNIT DIAL) 100 UNIT/ML (KWIKPEN): PEN_INJECTOR | SUBCUTANEOUS | 11 refills | Status: DC

## 2020-10-28 NOTE — Progress Notes (Signed)
Assessment and Plan:  See Encounters tab for problem-based medical decision making.   __________________________________________________________  HPI:   64 year old person living with chronic heart failure with preserved ejection fraction, chronic hypoxic respiratory failure requiring supplemental oxygen, obesity, hypertension, diabetes, here for follow-up of these issues.  Reports doing pretty well today, had some kind of viral illness about a week ago which is now resolved.  She had 1 emergency department visits in late November for a problem with her oxygen tank which was able to be resolved.  No recent hospitalizations.  Reports good adherence and access to her medications, though she has some confusion about her doses.  She brought her glucometer today but did not bring any of her medications with her for [name] to clarify.  No chest pain, no orthopnea.  She has dyspnea with minimal exertion, struggles to get around her house.  Still drives and is requesting a handicap placard today.  Having some decreased sensation of hearing in her right ear, uses Q-tips on a regular basis.  __________________________________________________________  Problem List: Patient Active Problem List   Diagnosis Date Noted  . Chronic respiratory failure with hypoxia, on home oxygen therapy ([place]) 10/14/2018    Priority: High  . Coronary artery disease involving native coronary artery of native heart without angina pectoris 11/09/2014    Priority: High  . Chronic combined systolic and diastolic heart failure ([place]) 05/10/2014    Priority: High  . Tobacco use disorder 01/15/2014    Priority: High  . Type 2 diabetes mellitus with moderate nonproliferative diabetic retinopathy ([place]) 10/15/2013    Priority: High  . Essential hypertension 10/15/2013    Priority: High  . Sinoatrial node dysfunction (HCC) 10/17/2018    Priority: Medium  . Aortic atherosclerosis ([place]) 09/24/2017    Priority: Medium  .  Gastroesophageal reflux disease 09/24/2017    Priority: Medium  . Obesity (BMI 30.0-34.9) 09/24/2017    Priority: Medium  . Hyperlipidemia     Priority: Medium  . Encounter for screening for lung cancer 05/13/2020    Priority: Low  . Nocturnal hypoxemia 12/30/2018    Priority: Low  . Psoriasis 09/24/2017    Priority: Low  . 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 Seasonal allergic rhinitis due to pollen 09/24/2017    Priority: Low  . Healthcare maintenance 09/24/2017    Priority: Low  . 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 Cerumen impaction 10/28/2020    Medications: Reconciled today in Epic __________________________________________________________  Physical Exam:  Vital Signs: Vitals:   10/28/20 0921  BP: (!) 147/66  Pulse: (!) 54  Temp: 98.2 F (36.8 C)  TempSrc: Oral  SpO2: 100%  Weight: 235 lb 6.4 oz (106.8 kg)  Height: 5\' 7"  (1.702 m)    Gen: Chronically ill-appearing woman, in a wheelchair, portable oxygen tank was with her ENT: Right ear is impacted with cerumen, left ear has a small amount of cerumen but the tympanic membrane is visualized.  After irrigation I was able to visualize the tympanic membrane of the right ear which appeared normal. Neck: Obese neck, JVD of about 3 cm above the sternal angle CV: RRR, no murmurs Pulm: Normal effort, CTA throughout, no wheezing Abd: Soft, NT, ND Ext: Warm, 2+ pitting edema on the left leg up to about the area of the anemia, 1+ pitting edema on the right leg to the mid shin Skin: Plaques consistent with  psoriasis in both of her knees and elbow

## 2020-10-28 NOTE — Assessment & Plan Note (Addendum)
Right ear was impacted with cerumen today which was reducing her hearing.  We were able to successfully irrigate this in the clinic today.  Counseled her on avoiding Q-tips.

## 2020-10-28 NOTE — Assessment & Plan Note (Signed)
Tolerated the addition of Farxiga well from last visit, however hemoglobin A1c has slightly increased to 8.6%.  Goal A1c for her is less than 8.0%.  Reviewed her glucose logs which show good fasting blood glucose, she has some elevated glucoses in the early evening after dinnertime.  Plan is to continue with Lantus 50 units daily, Humalog 15 units with breakfast and then increase her evening dinner dose to 20 units daily.  We will continue with Trulicity 1.5 mg weekly, and Farxiga 5 mg daily.  She cannot tolerate Metformin due to perceived side effects, not willing to try it again.

## 2020-10-28 NOTE — Assessment & Plan Note (Signed)
Chronic heart failure with preserved ejection fraction, mostly well compensated but NYHA class III symptoms.  Symptoms also likely related to her underlying lung disease.  She appears to be slightly hypervolemic today, she has 2+ lower extremity edema on the left leg, 1+ on the right.  No JVD on exam, weights are increasing up to 235 pounds, usually she is around 225 pounds.  No orthopnea.  She has been taking furosemide 160 mg once in the morning, this is not to the way it was prescribed but I think she was trying to do well with diuresis during the early day.  We talked about safe levels of diuresis.  Plan is to use furosemide 80 mg twice a day, reduce salt intake.  Continue with Delene Loll and [place].  Check a BMP today.  The left lower extremity swelling is a little out of proportion, I am going to check a D-dimer today to try to rule out DVT as the cause.  If the D-dimer is elevated will pursue ultrasound Doppler of the left lower extremity.

## 2020-11-01 ENCOUNTER — Ambulatory Visit: Payer: Medicare Other | Admitting: Cardiology

## 2020-11-11 ENCOUNTER — Encounter: Payer: Self-pay | Admitting: Cardiology

## 2020-11-11 ENCOUNTER — Other Ambulatory Visit: Payer: Self-pay

## 2020-11-11 ENCOUNTER — Ambulatory Visit (INDEPENDENT_AMBULATORY_CARE_PROVIDER_SITE_OTHER): Payer: Medicare Other | Admitting: Cardiology

## 2020-11-11 VITALS — BP 130/70 | HR 66 | Ht 67.0 in | Wt 232.0 lb

## 2020-11-11 DIAGNOSIS — I10 Essential (primary) hypertension: Secondary | ICD-10-CM

## 2020-11-11 DIAGNOSIS — E119 Type 2 diabetes mellitus without complications: Secondary | ICD-10-CM

## 2020-11-11 DIAGNOSIS — I5042 Chronic combined systolic (congestive) and diastolic (congestive) heart failure: Secondary | ICD-10-CM

## 2020-11-11 DIAGNOSIS — I428 Other cardiomyopathies: Secondary | ICD-10-CM | POA: Diagnosis not present

## 2020-11-11 NOTE — Patient Instructions (Signed)
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
## 2020-11-18 DIAGNOSIS — J9611 Chronic respiratory failure with hypoxia: Secondary | ICD-10-CM | POA: Diagnosis not present

## 2020-12-19 DIAGNOSIS — J9611 Chronic respiratory failure with hypoxia: Secondary | ICD-10-CM | POA: Diagnosis not present

## 2021-01-06 ENCOUNTER — Encounter: Payer: Medicare Other | Admitting: Student in an Organized Health Care Education/Training Program

## 2021-01-15 ENCOUNTER — Other Ambulatory Visit: Payer: Self-pay | Admitting: *Deleted

## 2021-01-16 MED ORDER — ACCU-CHEK MULTICLIX LANCETS MISC: 12 refills | Status: DC

## 2021-01-19 DIAGNOSIS — J9611 Chronic respiratory failure with hypoxia: Secondary | ICD-10-CM | POA: Diagnosis not present

## 2021-01-20 ENCOUNTER — Encounter: Payer: Self-pay | Admitting: Student in an Organized Health Care Education/Training Program

## 2021-01-20 ENCOUNTER — Other Ambulatory Visit: Payer: Self-pay | Admitting: Student in an Organized Health Care Education/Training Program

## 2021-01-20 ENCOUNTER — Other Ambulatory Visit (HOSPITAL_COMMUNITY)
Admission: RE | Admit: 2021-01-20 | Discharge: 2021-01-20 | Disposition: A | Discharge status: Home / Self Care | Payer: Medicare Other | Source: Ambulatory Visit | Attending: Student in an Organized Health Care Education/Training Program | Admitting: Student in an Organized Health Care Education/Training Program

## 2021-01-20 ENCOUNTER — Ambulatory Visit (INDEPENDENT_AMBULATORY_CARE_PROVIDER_SITE_OTHER): Payer: Medicare Other | Admitting: Student in an Organized Health Care Education/Training Program

## 2021-01-20 VITALS — BP 150/76 | HR 66 | Temp 98.1°F | Ht 67.0 in | Wt 226.3 lb

## 2021-01-20 DIAGNOSIS — Z124 Encounter for screening for malignant neoplasm of cervix: Secondary | ICD-10-CM

## 2021-01-20 DIAGNOSIS — Z9981 Dependence on supplemental oxygen: Secondary | ICD-10-CM

## 2021-01-20 DIAGNOSIS — E113393 Type 2 diabetes mellitus with moderate nonproliferative diabetic retinopathy without macular edema, bilateral: Secondary | ICD-10-CM

## 2021-01-20 DIAGNOSIS — I10 Essential (primary) hypertension: Secondary | ICD-10-CM

## 2021-01-20 DIAGNOSIS — Z1151 Encounter for screening for human papillomavirus (HPV): Secondary | ICD-10-CM | POA: Insufficient documentation

## 2021-01-20 DIAGNOSIS — N814 Uterovaginal prolapse, unspecified: Secondary | ICD-10-CM | POA: Diagnosis not present

## 2021-01-20 DIAGNOSIS — J9611 Chronic respiratory failure with hypoxia: Secondary | ICD-10-CM | POA: Diagnosis not present

## 2021-01-20 LAB — POCT GLYCOSYLATED HEMOGLOBIN (HGB A1C): Hemoglobin A1C: 8.7 % — AB (ref 4.0–5.6)

## 2021-01-20 LAB — GLUCOSE, CAPILLARY: Glucose-Capillary: 119 mg/dL — ABNORMAL HIGH (ref 70–99)

## 2021-01-20 NOTE — Patient Instructions (Signed)
Please increase your do not I am short acting insulin to 20 units.  Remember to check your blood sugar about 3 times daily, ideally it would be first thing in the morning, right before you eat, and about 2 hours after a meal.

## 2021-01-20 NOTE — Assessment & Plan Note (Signed)
Blood pressure is a bit above goal, asked the patient to check more blood pressure readings at home.  Plan continue amlodipine 10 mg daily, Entresto 200 mg twice daily.  Cannot tolerate beta-blockers due to history of sinus node dysfunction.  I think if she continues to be hypertensive even at home, would likely change the loop diuretic to a full dose thiazide diuretic we will have better blood pressure.

## 2021-01-20 NOTE — Assessment & Plan Note (Signed)
Patient looks to have a stable grade 2 uterine prolapse.  Has some mild urinary incontinence as well, likely from a cystocele associated with this.  She has had vaginal deliveries.  Symptoms currently are not bothering her.  She has a pessary at home but does not like to use it.  We will continue to monitor, no need for further intervention at this time, certainly not a good candidate for surgery unless it was absolutely essential.

## 2021-01-20 NOTE — Assessment & Plan Note (Signed)
Patient with chronic hypoxic respiratory failure requiring 2 L supplemental oxygen with exertion.  Etiology is likely multifactorial due to obesity hypoventilation syndrome, emphysema, and heart failure with preserved ejection fraction.  She quit smoking in 2020 and has been successful ever since.  Has no significant chronic bronchitis component.  We are treating with a combination LABA/ICS given the reversible component on spirometry and her history of asthma.  Plan to continue with supplemental oxygen, encouraged exercise, weight loss, and nocturnal oxygen use.

## 2021-01-20 NOTE — Assessment & Plan Note (Signed)
Hemoglobin A1c still little out of range of 8.7%.  Glucose logs which show excellent fasting blood sugar in the morning, but then she has hyperglycemia in the evening likely due to her carbohydrate intake.  Plan is to increase her short acting insulin from 15 units twice daily to 15 units with breakfast and 20 units with dinner.  She is also will work on reducing her afternoon carbohydrate intake.  We will also continue with dapagliflozin, Lantus 50 units at bedtime, and Trulicity 1.5 mg weekly.  I gave her some counseling about the use of lancets today.  Follow-up A1c in 3 months.

## 2021-01-20 NOTE — Progress Notes (Signed)
   Assessment and Plan:  See Encounters tab for problem-based medical decision making.   __________________________________________________________  HPI:   65 year old person here for follow-up of diabetes, hypertension.  Doing well since I last saw her, no illness, no emergency department visits or hospitalizations.  She adheres to using 2 L of supplemental oxygen with exertion, can get around her apartment without difficulty.  Has some shortness of breath with walking more than 1 city block.  Reports good adherence with her medications, no recent side effects.  Denies any chest pain or chest pressure.  No cough.    __________________________________________________________  Problem List: Patient Active Problem List   Diagnosis Date Noted  . Chronic respiratory failure with hypoxia, on home oxygen therapy ([place]) 10/14/2018    Priority: High  . Coronary artery disease involving native coronary artery of native heart without angina pectoris 11/09/2014    Priority: High  . Chronic combined systolic and diastolic heart failure ([place]) 05/10/2014    Priority: High  . Tobacco use disorder 01/15/2014    Priority: High  . Type 2 diabetes mellitus with moderate nonproliferative diabetic retinopathy ([place]) 10/15/2013    Priority: High  . Essential hypertension 10/15/2013    Priority: High  . Sinoatrial node dysfunction ([facility]) 10/17/2018    Priority: Medium  . Aortic atherosclerosis ([place]) 09/24/2017    Priority: Medium  . Gastroesophageal reflux disease 09/24/2017    Priority: Medium  . Obesity (BMI 30.0-34.9) 09/24/2017    Priority: Medium  . Hyperlipidemia     Priority: Medium  . Encounter for screening for lung cancer 05/13/2020    Priority: Low  . Nocturnal hypoxemia 12/30/2018    Priority: Low  . Psoriasis 09/24/2017    Priority: Low  . 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 Seasonal allergic rhinitis due to pollen 09/24/2017    Priority:  Low  . Healthcare maintenance 09/24/2017    Priority: Low  . 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 No rashes

## 2021-01-21 LAB — CYTOLOGY - PAP
Comment: NEGATIVE
Diagnosis: NEGATIVE
High risk HPV: NEGATIVE

## 2021-02-05 ENCOUNTER — Encounter: Payer: Self-pay | Admitting: *Deleted

## 2021-02-05 NOTE — Progress Notes (Signed)

## 2021-02-10 NOTE — Progress Notes (Signed)
Things That May Be Affecting Your Health:  Alcohol  Hearing loss  Pain   x Depression  Home Safety  Sexual Health   Diabetes x Lack of physical activity  Stress   Difficulty with daily activities  Loneliness  Tiredness   Drug use  Medicines  Tobacco use  x Falls  Motor Vehicle Safety  Weight   Food choices  Oral Health  Other    YOUR PERSONALIZED HEALTH PLAN : 1. Schedule your next subsequent Medicare Wellness visit in one year 2. Attend all of your regular appointments to address your medical issues 3. Complete the preventative screenings and services   Annual Wellness Visit   Medicare Covered Preventative Screenings and [place] Men and Women Who How Often Need? Date of Last Service Action  Abdominal Aortic Aneurysm Adults with AAA risk factors Once      Alcohol Misuse and Counseling All Adults Screening once a year if no alcohol misuse. Counseling up to 4 face to face sessions.     Bone Density Measurement  Adults at risk for osteoporosis Once every 2 yrs      Lipid Panel Z13.6 All adults without CV disease Once every 5 yrs       Colorectal Cancer   Stool sample or  Colonoscopy All adults 37 and older   Once every year  Every 10 years        Depression All Adults Once a year  Today   Diabetes Screening Blood glucose, post glucose load, or GTT Z13.1  All adults at risk  Pre-diabetics  Once per year  Twice per year      Diabetes  Self-Management Training All adults Diabetics 10 hrs first year; 2 hours subsequent years. Requires Copay     Glaucoma  Diabetics  Family history of glaucoma  African Americans 62 yrs +  Hispanic Americans 70 yrs + Annually - requires coppay      Hepatitis C Z72.89 or F19.20  High Risk for HCV  Born between 1945 and 1965  Annually  Once      HIV Z11.4 All adults based on risk  Annually btw ages 74 & 75 regardless of risk  Annually > 65 yrs if at increased risk      Lung Cancer Screening  Asymptomatic adults aged 35-77 with 30 pack yr history and current smoker OR quit within the last 15 yrs Annually Must have counseling and shared decision making documentation before first screen      Medical Nutrition Therapy Adults with   Diabetes  Renal disease  Kidney transplant within past 3 yrs 3 hours first year; 2 hours subsequent years     Obesity and Counseling All adults Screening once a year Counseling if BMI 30 or higher  Today   Tobacco Use Counseling Adults who use tobacco  Up to 8 visits in one year     Vaccines Z23  Hepatitis B  Influenza   Pneumonia  Adults   Once  Once every flu season  Two different vaccines separated by one year     Next Annual Wellness Visit People with Medicare Every year  Today     Services & Screenings Women Who How Often Need  Date of Last Service Action  Mammogram  Z12.31 Women over 25 One baseline ages 35-39. Annually ager 40 yrs+      Pap tests All women Annually if high risk. Every 2 yrs for normal risk women  Screening for cervical cancer with   Pap (Z01.419 nl or Z01.411abnl) &  HPV Z11.51 Women aged 63 to 61 Once every 5 yrs     Screening pelvic and breast exams All women Annually if high risk. Every 2 yrs for normal risk women     Sexually Transmitted Diseases  Chlamydia  Gonorrhea  Syphilis All at risk adults Annually for non pregnant females at increased risk         [place] Men Who How Ofter Need  Date of Last Service Action  Prostate Cancer - DRE & PSA Men over 50 Annually.  DRE might require a copay.        Sexually Transmitted Diseases  Syphilis All at risk adults Annually for men at increased risk      Health Maintenance List Health Maintenance  Topic Date Due  . OPHTHALMOLOGY EXAM  01/29/2019  . INFLUENZA VACCINE  02/20/2021 (Originally 06/23/2020)  . COVID-19 Vaccine (3 - Booster for Pfizer series) 02/21/2021  . HEMOGLOBIN A1C  04/19/2021  . FOOT EXAM  05/13/2021  .  MAMMOGRAM  04/29/2022  . PAP SMEAR-Modifier  01/21/2024  . TETANUS/TDAP  09/25/2027  . PNEUMOCOCCAL POLYSACCHARIDE VACCINE AGE 23-64 HIGH RISK  Completed  . Hepatitis C Screening  Completed  . HIV Screening  Completed  . HPV VACCINES  Aged Out

## 2021-02-16 DIAGNOSIS — J9611 Chronic respiratory failure with hypoxia: Secondary | ICD-10-CM | POA: Diagnosis not present

## 2021-02-25 ENCOUNTER — Encounter: Payer: Self-pay | Admitting: Internal Medicine

## 2021-02-25 ENCOUNTER — Ambulatory Visit (INDEPENDENT_AMBULATORY_CARE_PROVIDER_SITE_OTHER): Payer: Medicare Other | Admitting: Internal Medicine

## 2021-02-25 ENCOUNTER — Other Ambulatory Visit: Payer: Self-pay

## 2021-02-25 DIAGNOSIS — J9611 Chronic respiratory failure with hypoxia: Secondary | ICD-10-CM

## 2021-02-25 DIAGNOSIS — Z0000 Encounter for general adult medical examination without abnormal findings: Secondary | ICD-10-CM

## 2021-02-25 DIAGNOSIS — Z9981 Dependence on supplemental oxygen: Secondary | ICD-10-CM | POA: Diagnosis not present

## 2021-02-25 NOTE — Patient Instructions (Addendum)
All Notes   Progress Notes by Axel Filler, MD at 02/05/2021 3:14 PM  Author: Axel Filler, MD Author Type: Physician Filed: 02/10/2021 2:26 PM  Note Status: Signed Cosign: Cosign Not Required Encounter Date: 02/05/2021  Editor: Axel Filler, MD (Physician)             Things That May Be Affecting Your Health:  Alcohol  Hearing loss  Pain   x Depression  Home Safety  Sexual Health   Diabetes x Lack of physical activity  Stress   Difficulty with daily activities  Loneliness  Tiredness   Drug use  Medicines  Tobacco use  x Falls  Motor Vehicle Safety  Weight   Food choices  Oral Health  Other    [place] : 1. Schedule your next subsequent Medicare Wellness visit in one year 2. Attend all of your regular appointments to address your medical issues 3. Complete the preventative screenings and services 4.  Please schedule an eye exam with Dr. Katy Fitch. 5.  [facility] and [facility] your Covid-19 Booster vaccine. 6.  Begin seated and standing exercises with exercise band to increase strength and balance.   Annual Wellness Visit                       Medicare Covered Preventative Screenings and Services  Services & Screenings Men and Women Who How Often Need? Date of Last Service Action  Abdominal Aortic Aneurysm Adults with AAA risk factors Once      Alcohol Misuse and Counseling All Adults Screening once a year if no alcohol misuse. Counseling up to 4 face to face sessions.     Bone Density Measurement  Adults at risk for osteoporosis Once every 2 yrs      Lipid Panel Z13.6 All adults without CV disease Once every 5 yrs       Colorectal Cancer   Stool sample or  Colonoscopy All adults 45 and older   Once every year  Every 10 years        Depression All Adults Once a year  Today   Diabetes Screening Blood glucose, post glucose load, or GTT Z13.1  All adults at  risk  Pre-diabetics  Once per year  Twice per year      Diabetes  Self-Management Training All adults Diabetics 10 hrs first year; 2 hours subsequent years. Requires Copay     Glaucoma  Diabetics  Family history of glaucoma  African Americans 39 yrs +  Hispanic Americans 59 yrs + Annually - requires coppay      Hepatitis C Z72.89 or F19.20  High Risk for HCV  Born between 1945 and 1965  Annually  Once      HIV Z11.4 All adults based on risk  Annually btw ages 26 & 51 regardless of risk  Annually > 65 yrs if at increased risk      Lung Cancer Screening Asymptomatic adults aged 39-77 with 30 pack yr history and current smoker OR quit within the last 15 yrs Annually Must have counseling and shared decision making documentation before first screen      Medical Nutrition Therapy Adults with   Diabetes  Renal disease  Kidney transplant within past 3 yrs 3 hours first year; 2 hours subsequent years     Obesity and Counseling All adults Screening once a year Counseling if BMI 30 or higher  Today   Tobacco Use Counseling Adults who use  tobacco  Up to 8 visits in one year     Vaccines Z23  Hepatitis B  Influenza   Pneumonia  Adults   Once  Once every flu season  Two different vaccines separated by one year     Next Annual Wellness Visit People with Medicare Every year  Today     [place] Women Who How Often Need  Date of Last Service Action  Mammogram  Z12.31 Women over 58 One baseline ages 38-39. Annually ager 40 yrs+      Pap tests All women Annually if high risk. Every 2 yrs for normal risk women      Screening for cervical cancer with   Pap (Z01.419 nl or Z01.411abnl) &  HPV Z11.51 Women aged 71 to 88 Once every 5 yrs     Screening pelvic and breast exams All women Annually if high risk. Every 2 yrs for normal risk women     Sexually Transmitted  Diseases  Chlamydia  Gonorrhea  Syphilis All at risk adults Annually for non pregnant females at increased risk         [place] Men Who How Ofter Need  Date of Last Service Action  Prostate Cancer - DRE & PSA Men over 50 Annually.  DRE might require a copay.        Sexually Transmitted Diseases  Syphilis All at risk adults Annually for men at increased risk            Health Maintenance, Female Adopting a healthy lifestyle and getting preventive care are important in promoting health and wellness. Ask your health care provider about:  The right schedule for you to have regular tests and exams.  Things you can do on your own to prevent diseases and keep yourself healthy. What should I know about diet, weight, and exercise? Eat a healthy diet  Eat a diet that includes plenty of vegetables, fruits, low-fat dairy products, and lean protein.  Do not eat a lot of foods that are high in solid fats, added sugars, or sodium.   Maintain a healthy weight Body mass index (BMI) is used to identify weight problems. It estimates body fat based on height and weight. Your health care provider can help determine your BMI and help you achieve or maintain a healthy weight. Get regular exercise Get regular exercise. This is one of the most important things you can do for your health. Most adults should:  Exercise for at least 150 minutes each week. The exercise should increase your heart rate and make you sweat (moderate-intensity exercise).  Do strengthening exercises at least twice a week. This is in addition to the moderate-intensity exercise.  Spend less time sitting. Even light physical activity can be beneficial. Watch cholesterol and blood lipids Have your blood tested for lipids and cholesterol at 63 years of age, then have this test every 5 years. Have your cholesterol levels checked more often if:  Your lipid or cholesterol levels are high.  You  are older than 63 years of age.  You are at high risk for heart disease. What should I know about cancer screening? Depending on your health history and family history, you may need to have cancer screening at various ages. This may include screening for:  Breast cancer.  Cervical cancer.  Colorectal cancer.  Skin cancer.  Lung cancer. What should I know about heart disease, diabetes, and high blood pressure? Blood pressure and heart disease  High blood pressure  causes heart disease and increases the risk of stroke. This is more likely to develop in people who have high blood pressure readings, are of African descent, or are overweight.  Have your blood pressure checked: ? Every 3-5 years if you are 2-59 years of age. ? Every year if you are 38 years old or older. Diabetes Have regular diabetes screenings. This checks your fasting blood sugar level. Have the screening done:  Once every three years after age 46 if you are at a normal weight and have a low risk for diabetes.  More often and at a younger age if you are overweight or have a high risk for diabetes. What should I know about preventing infection? Hepatitis B If you have a higher risk for hepatitis B, you should be screened for this virus. Talk with your health care provider to find out if you are at risk for hepatitis B infection. Hepatitis C Testing is recommended for:  Everyone born from [birth year] through 1965.  Anyone with known risk factors for hepatitis C. Sexually transmitted infections (STIs)  Get screened for STIs, including gonorrhea and chlamydia, if: ? You are sexually active and are younger than 63 years of age. ? You are older than 63 years of age and your health care provider tells you that you are at risk for this type of infection. ? Your sexual activity has changed since you were last screened, and you are at increased risk for chlamydia or gonorrhea. Ask your health care provider if you are at  risk.  Ask your health care provider about whether you are at high risk for HIV. Your health care provider may recommend a prescription medicine to help prevent HIV infection. If you choose to take medicine to prevent HIV, you should first get tested for HIV. You should then be tested every 3 months for as long as you are taking the medicine. Pregnancy  If you are about to stop having your period (premenopausal) and you may become pregnant, seek counseling before you get pregnant.  Take 400 to 800 micrograms (mcg) of folic acid every day if you become pregnant.  Ask for birth control (contraception) if you want to prevent pregnancy. Osteoporosis and menopause Osteoporosis is a disease in which the bones lose minerals and strength with aging. This can result in bone fractures. If you are 72 years old or older, or if you are at risk for osteoporosis and fractures, ask your health care provider if you should:  Be screened for bone loss.  Take a calcium or vitamin D supplement to lower your risk of fractures.  Be given hormone replacement therapy (HRT) to treat symptoms of menopause. Follow these instructions at home: Lifestyle  Do not use any products that contain nicotine or tobacco, such as cigarettes, e-cigarettes, and chewing tobacco. If you need help quitting, ask your health care provider.  Do not use street drugs.  Do not share needles.  Ask your health care provider for help if you need support or information about quitting drugs. Alcohol use  Do not drink alcohol if: ? Your health care provider tells you not to drink. ? You are pregnant, may be pregnant, or are planning to become pregnant.  If you drink alcohol: ? Limit how much you use to 0-1 drink a day. ? Limit intake if you are breastfeeding.  Be aware of how much alcohol is in your drink. In the [place] one drink equals one 12 oz bottle of beer (355 mL),  one 5 oz glass of wine (148 mL), or one 1 oz glass of hard liquor  (44 mL). General instructions  Schedule regular health, dental, and eye exams.  Stay current with your vaccines.  Tell your health care provider if: ? You often feel depressed. ? You have ever been abused or do not feel safe at home. Summary  Adopting a healthy lifestyle and getting preventive care are important in promoting health and wellness.  Follow your health care provider's instructions about healthy diet, exercising, and getting tested or screened for diseases.  Follow your health care provider's instructions on monitoring your cholesterol and blood pressure. This information is not intended to replace advice given to you by your health care provider. Make sure you discuss any questions you have with your health care provider. Document Revised: 11/02/2018 Document Reviewed: 11/02/2018 Elsevier Patient Education  2021 [employer].

## 2021-02-25 NOTE — Progress Notes (Signed)
This AWV is being conducted by [facility] only. The patient was located at home and I was located in [facility]. The patient's identity was confirmed using their DOB and current address. The patient or his/her legal guardian has consented to being evaluated through a telephone encounter and understands the associated risks (an examination cannot be done and the patient may need to come in for an appointment) / benefits (allows the patient to remain at home, decreasing exposure to coronavirus). I personally spent 38 minutes conducting the AWV.  Subjective:   Alicia [name] is a 65 y.o. female who presents for a Medicare Annual Wellness Visit.  The following items have been reviewed and updated today in the appropriate area in the EMR.   Health Risk Assessment  Height, weight, BMI, and BP Visual acuity if needed Depression screen Fall risk / safety level Advance directive discussion Medical and family history were reviewed and updated Updating list of other providers & suppliers Medication reconciliation, including over the counter medicines Cognitive screen Written screening schedule Risk Factor list Personalized health advice, risky behaviors, and treatment advice  Social History   Social History Narrative   Current Social History 02/25/2021        Patient lives with family in a home which is 2 stories. There are steps up to the entrance the patient uses with handrails.       Patient's method of transportation is personal car.      The highest level of education was some high school.      The patient currently disabled.      Identified important Relationships are "Family, my daughter and grandkids"       Pets : My dog       Interests / Fun: "sleeping, working in my flowerbed        Current Stressors: The war in [place]             Objective:    Vitals: LMP  (LMP Unknown)  Vitals are unable to obtained due to FYBOF-75 public health emergency  Activities of Daily  Living In your present state of health, do you have any difficulty performing the following activities: 02/25/2021 01/20/2021  Hearing? Y N  Comment getting worse as she ages -  Vision? Y N  Comment will make an appt with Dr. Katy Fitch for new glasses -  Difficulty concentrating or making decisions? N N  Walking or climbing stairs? Y N  Comment d/t to back pain -  Dressing or bathing? N N  Doing errands, shopping? N N  Some recent data might be hidden    Goals Goals    . Blood Pressure < 140/90    . HEMOGLOBIN A1C < 7.0    . Increase physical activity     Begin seated and standing exercises with exercise band to increase strength and balance.     . Weight (lb) < 170 lb (77.1 kg)       Fall Risk Fall Risk  02/25/2021 01/20/2021 10/28/2020 08/05/2020 06/18/2020  Falls in the past year? 0 0 0 0 0  Number falls in past yr: - 0 0 0 -  Injury with Fall? - 0 0 0 -  Risk for fall due to : - - - - -  Risk for fall due to: Comment - - - - -  Follow up Falls evaluation completed - - - Falls evaluation completed    Depression Screen [facility] 2/9 Scores 02/25/2021 01/20/2021 10/28/2020 08/05/2020  PHQ - 2 Score 0 0 0 0  PHQ- 9 Score 0 - - -  Exception Documentation - - - -     Cognitive Testing Six-Item Cognitive Screener   "I would like to ask you some questions that ask you to use your memory. I am going to name three objects. Please wait until I say all three words, then repeat them. Remember what they are  because I am going to ask you to name them again in a few minutes. Please repeat these words for me: APPLE--TABLE--PENNY." (Interviewer may repeat names 3 times if necessary but repetition not scored.)  Did patient correctly repeat all three words? Yes - may proceed with screen  What year is this? Correct What month is this? Correct What day of the week is this? Correct  What were the three objects I asked you to remember? . Apple Correct . Table Correct . Penny Correct  Score one point  for each incorrect answer.  A score of 2 or more points warrants additional investigation.  Patient's score 0     Assessment and Plan:     The patient has set a goal to increase her activity level.  She will begin seated and standing exercises with exercise band to increase strength and balance.  CDC Handout on Fall Prevention and Handout on Home Exercise Program, Access codes [id number] and [id number] given/mailed to patient with exercise band.   The patient continues to use 2L of O2 for exertion and nocturnal use.  The patient agrees to schedule an eye exam with Dr. Katy Fitch.  The [facility] Covid-19 booster vaccine.  During the course of the visit the patient was educated and counseled about appropriate screening and preventive services as documented in the assessment and plan.  The printed AVS was given to the patient and included an updated screening schedule, a list of risk factors, and personalized health advice.        Higinio Roger, RN  02/25/2021

## 2021-02-26 NOTE — Progress Notes (Signed)
Internal Medicine Clinic Attending  Case discussed with Dr.  Marva Panda  at the time of the visit.  We reviewed the AWV findings.  I agree with the assessment, diagnosis, and plan of care documented in the AWV note.

## 2021-03-02 ENCOUNTER — Other Ambulatory Visit: Payer: Self-pay | Admitting: Student in an Organized Health Care Education/Training Program

## 2021-03-02 DIAGNOSIS — E113393 Type 2 diabetes mellitus with moderate nonproliferative diabetic retinopathy without macular edema, bilateral: Secondary | ICD-10-CM

## 2021-03-03 ENCOUNTER — Telehealth: Payer: Self-pay

## 2021-03-03 NOTE — Telephone Encounter (Signed)
Refill request received by patient's pharmacy and submitted for refill this morning. SChaplin, RN,BSN

## 2021-03-03 NOTE — Telephone Encounter (Signed)
Pt is requesting her B-D UF III MINI PEN NEEDLES 31G X 5 MM MISC [id number] [place], [place] - [address] AT [address] Phone:  [phone]  Fax:  [phone]      (  Pt stated that she only has three needles left)

## 2021-03-04 NOTE — Progress Notes (Signed)
I discussed the AWV findings with the RN who conducted the visit. I was present in the office suite and immediately available to provide assistance and direction throughout the time the service was provided.  Harvie Heck, MD Internal Medicine, PGY-2

## 2021-03-19 DIAGNOSIS — J9611 Chronic respiratory failure with hypoxia: Secondary | ICD-10-CM | POA: Diagnosis not present

## 2021-03-31 ENCOUNTER — Other Ambulatory Visit: Payer: Self-pay | Admitting: Student in an Organized Health Care Education/Training Program

## 2021-03-31 DIAGNOSIS — Z1231 Encounter for screening mammogram for malignant neoplasm of breast: Secondary | ICD-10-CM

## 2021-04-14 ENCOUNTER — Encounter: Payer: Self-pay | Admitting: Student in an Organized Health Care Education/Training Program

## 2021-04-14 ENCOUNTER — Ambulatory Visit (INDEPENDENT_AMBULATORY_CARE_PROVIDER_SITE_OTHER): Payer: Medicare Other | Admitting: Student in an Organized Health Care Education/Training Program

## 2021-04-14 VITALS — BP 146/67 | HR 61 | Temp 98.6°F | Ht 67.0 in | Wt 229.1 lb

## 2021-04-14 DIAGNOSIS — J9611 Chronic respiratory failure with hypoxia: Secondary | ICD-10-CM

## 2021-04-14 DIAGNOSIS — E113393 Type 2 diabetes mellitus with moderate nonproliferative diabetic retinopathy without macular edema, bilateral: Secondary | ICD-10-CM

## 2021-04-14 DIAGNOSIS — Z0000 Encounter for general adult medical examination without abnormal findings: Secondary | ICD-10-CM

## 2021-04-14 DIAGNOSIS — Z9981 Dependence on supplemental oxygen: Secondary | ICD-10-CM

## 2021-04-14 DIAGNOSIS — I700 Atherosclerosis of aorta: Secondary | ICD-10-CM | POA: Diagnosis not present

## 2021-04-14 DIAGNOSIS — G4734 Idiopathic sleep related nonobstructive alveolar hypoventilation: Secondary | ICD-10-CM | POA: Diagnosis not present

## 2021-04-14 DIAGNOSIS — I10 Essential (primary) hypertension: Secondary | ICD-10-CM | POA: Diagnosis not present

## 2021-04-14 LAB — POCT GLYCOSYLATED HEMOGLOBIN (HGB A1C): Hemoglobin A1C: 8.3 % — AB (ref 4.0–5.6)

## 2021-04-14 LAB — GLUCOSE, CAPILLARY: Glucose-Capillary: 142 mg/dL — ABNORMAL HIGH (ref 70–99)

## 2021-04-14 MED ORDER — SENNA 8.6 MG PO TABS: 2.0000 | ORAL_TABLET | Freq: Every day | ORAL | 2 refills | Status: DC

## 2021-04-14 MED ORDER — POLYETHYLENE GLYCOL 3350 17 G PO PACK: 17.0000 g | PACK | Freq: Two times a day (BID) | ORAL | 3 refills | Status: DC

## 2021-04-14 NOTE — Assessment & Plan Note (Signed)
Chronic hypoxic respiratory failure is stable, still requiring 2-3 L supplemental oxygen at home, especially with exertion.  She reports good adherence with her inhalers which include a combination LABA/ICS.  We talked about a sleep study today as I think she likely has sleep apnea and possibly obesity hypoventilation syndrome.  She is going to think about this.  Encouraged more exertion.  She stopped smoking in 2020, I congratulated her again for this.

## 2021-04-14 NOTE — Assessment & Plan Note (Signed)
Hemoglobin A1c today is 8.3%.  Much better than it was a year ago when it was consistently over 10%.  Given her current comorbidities and her current diabetes regimen I think this is as good as were going to get.  Plan to continue with Farxiga 5 mg, Trulicity 1.5 mg weekly, Lantus 50 units daily, and Humalog 15 units with breakfast and 20 units with dinner.  We will check a urine microalbumin today.  We will check a BMP.  Follow-up in 3 months.  Encouraged more exercise in the interim.

## 2021-04-14 NOTE — Assessment & Plan Note (Signed)
Up-to-date on colon, cervical, and breast cancer screening.  Given her comorbidities I doubt she would have much further benefit from the screening methods.  Up-to-date on her vaccinations.

## 2021-04-14 NOTE — Assessment & Plan Note (Signed)
Stable.  Plan to check lipids today.  Continue with aspirin 81 mg and atorvastatin 40 mg daily.  Goal LDL is around 70, but we would have to weigh the addition of further medications with her polypharmacy and comorbidities.

## 2021-04-14 NOTE — Progress Notes (Signed)
   Assessment and Plan:  See Encounters tab for problem-based medical decision making.   __________________________________________________________  HPI:   65 year old person here for follow-up of difficult to control diabetes.  She reports good adherence with her medications, though did not bring them to clinic today.  Denies any hypoglycemia.  Denies any adverse allergic reactions.  Has had no significant medication changes recently.  Reports good exertional capacity, uses supplemental oxygen throughout the day.  Takes care of of her grandchild who is almost finished with high school.  Has some issues with urinary incontinence but not willing to wear a pessary.  No recent illnesses, no fevers or chills, no hospitalizations or ED visits.  __________________________________________________________  Problem List: Patient Active Problem List   Diagnosis Date Noted  . Chronic respiratory failure with hypoxia, on home oxygen therapy ([place]) 10/14/2018    Priority: High  . Coronary artery disease involving native coronary artery of native heart without angina pectoris 11/09/2014    Priority: High  . Chronic combined systolic and diastolic heart failure ([place]) 05/10/2014    Priority: High  . 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 Essential hypertension 10/15/2013    Priority: High  . Sinoatrial node dysfunction ([facility]) 10/17/2018    Priority: Medium  . Aortic atherosclerosis ([place]) 09/24/2017    Priority: Medium  . Gastroesophageal reflux disease 09/24/2017    Priority: Medium  . Obesity (BMI 30.0-34.9) 09/24/2017    Priority: Medium  . Hyperlipidemia     Priority: Medium  . Encounter for screening for lung cancer 05/13/2020    Priority: Low  . Nocturnal hypoxemia 12/30/2018    Priority: Low  . Psoriasis 09/24/2017    Priority: Low  . 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 Seasonal allergic rhinitis due to pollen 09/24/2017    Priority: Low  . Healthcare maintenance 09/24/2017    Priority: Low  . Uterine prolapse 02/12/2016    Priority: Low    Medications: Reconciled today in Epic __________________________________________________________  Physical Exam:  Vital Signs: Vitals:   04/14/21 0932  BP: (!) 152/67  Pulse: 60  Temp: 98.6 F (37 C)  TempSrc: Oral  SpO2: 100%  Weight: 229 lb 1.6 oz (103.9 kg)  Height: 5\' 7"  (1.702 m)    Gen: Well appearing, NAD CV: RRR, no murmurs Pulm: Normal effort, CTA throughout, no wheezing Abd: Soft, NT, ND Ext: Warm, 1+ pitting edema in bilateral lower extremities Skin: No chronic edema skin changes in the legs

## 2021-04-14 NOTE — Assessment & Plan Note (Signed)
Patient with diet time somnolence, obesity, at risk for obstructive sleep apnea.  I recommended sleep study, patient is not sure if she would wear a CPAP at night.  She is going to think about it.  If she is willing to do the sleep study in the future will refer to [place].

## 2021-04-15 ENCOUNTER — Encounter: Payer: Self-pay | Admitting: Student in an Organized Health Care Education/Training Program

## 2021-04-15 LAB — MICROALBUMIN / CREATININE URINE RATIO
Creatinine, Urine: 57 mg/dL
Microalb/Creat Ratio: 4991 mg/g creat — ABNORMAL HIGH (ref 0–29)
Microalbumin, Urine: 2844.9 ug/mL

## 2021-04-15 LAB — LIPID PANEL
Chol/HDL Ratio: 3.9 ratio (ref 0.0–4.4)
Cholesterol, Total: 218 mg/dL — ABNORMAL HIGH (ref 100–199)
HDL: 56 mg/dL (ref >39)
LDL Chol Calc (NIH): 137 mg/dL — ABNORMAL HIGH (ref 0–99)
Triglycerides: 143 mg/dL (ref 0–149)
VLDL Cholesterol Cal: 25 mg/dL (ref 5–40)

## 2021-04-15 LAB — BMP8+ANION GAP
Anion Gap: 16 mmol/L (ref 10.0–18.0)
BUN/Creatinine Ratio: 16 (ref 12–28)
BUN: 13 mg/dL (ref 8–27)
CO2: 29 mmol/L (ref 20–29)
Calcium: 9.2 mg/dL (ref 8.7–10.3)
Chloride: 95 mmol/L — ABNORMAL LOW (ref 96–106)
Creatinine, Ser: 0.79 mg/dL (ref 0.57–1.00)
Glucose: 136 mg/dL — ABNORMAL HIGH (ref 65–99)
Potassium: 3.8 mmol/L (ref 3.5–5.2)
Sodium: 140 mmol/L (ref 134–144)
eGFR: 84 mL/min/{1.73_m2} (ref >59)

## 2021-04-18 DIAGNOSIS — J9611 Chronic respiratory failure with hypoxia: Secondary | ICD-10-CM | POA: Diagnosis not present

## 2021-05-19 DIAGNOSIS — J9611 Chronic respiratory failure with hypoxia: Secondary | ICD-10-CM | POA: Diagnosis not present

## 2021-05-27 ENCOUNTER — Ambulatory Visit
Admission: RE | Admit: 2021-05-27 | Discharge: 2021-05-27 | Disposition: A | Discharge status: Home / Self Care | Payer: Medicare Other | Source: Ambulatory Visit | Attending: Student in an Organized Health Care Education/Training Program | Admitting: Student in an Organized Health Care Education/Training Program

## 2021-05-27 ENCOUNTER — Encounter: Payer: Self-pay | Admitting: *Deleted

## 2021-05-27 ENCOUNTER — Other Ambulatory Visit: Payer: Self-pay

## 2021-05-27 DIAGNOSIS — Z1231 Encounter for screening mammogram for malignant neoplasm of breast: Secondary | ICD-10-CM

## 2021-06-18 DIAGNOSIS — J9611 Chronic respiratory failure with hypoxia: Secondary | ICD-10-CM | POA: Diagnosis not present

## 2021-06-23 ENCOUNTER — Ambulatory Visit (INDEPENDENT_AMBULATORY_CARE_PROVIDER_SITE_OTHER): Payer: Medicare Other | Admitting: Cardiology

## 2021-06-23 ENCOUNTER — Other Ambulatory Visit: Payer: Self-pay

## 2021-06-23 ENCOUNTER — Encounter: Payer: Self-pay | Admitting: Cardiology

## 2021-06-23 VITALS — BP 140/80 | HR 63 | Ht 67.0 in | Wt 230.8 lb

## 2021-06-23 DIAGNOSIS — I10 Essential (primary) hypertension: Secondary | ICD-10-CM

## 2021-06-23 DIAGNOSIS — I5042 Chronic combined systolic (congestive) and diastolic (congestive) heart failure: Secondary | ICD-10-CM | POA: Diagnosis not present

## 2021-06-23 DIAGNOSIS — I428 Other cardiomyopathies: Secondary | ICD-10-CM | POA: Diagnosis not present

## 2021-06-23 DIAGNOSIS — E119 Type 2 diabetes mellitus without complications: Secondary | ICD-10-CM

## 2021-06-23 NOTE — Patient Instructions (Signed)
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
## 2021-07-09 ENCOUNTER — Other Ambulatory Visit: Payer: Self-pay | Admitting: Student in an Organized Health Care Education/Training Program

## 2021-07-09 DIAGNOSIS — I10 Essential (primary) hypertension: Secondary | ICD-10-CM

## 2021-07-19 DIAGNOSIS — J9611 Chronic respiratory failure with hypoxia: Secondary | ICD-10-CM | POA: Diagnosis not present

## 2021-07-20 ENCOUNTER — Other Ambulatory Visit: Payer: Self-pay | Admitting: Student in an Organized Health Care Education/Training Program

## 2021-07-20 DIAGNOSIS — H1013 Acute atopic conjunctivitis, bilateral: Secondary | ICD-10-CM

## 2021-07-23 ENCOUNTER — Other Ambulatory Visit: Payer: Self-pay | Admitting: Student in an Organized Health Care Education/Training Program

## 2021-07-24 ENCOUNTER — Encounter: Payer: Self-pay | Admitting: Dietician

## 2021-07-24 ENCOUNTER — Telehealth: Payer: Self-pay | Admitting: Dietician

## 2021-07-24 NOTE — Telephone Encounter (Signed)
Called patient about her yearly diabetes eye exam. She has not had one recently and would like help scheduling. " I just have not gotten around to it" She prefers the morning: an appointment was scheduled and mailed to her for  Monday 10/31 /2022 10:45 Am

## 2021-08-05 ENCOUNTER — Other Ambulatory Visit: Payer: Self-pay | Admitting: *Deleted

## 2021-08-06 MED ORDER — ACCU-CHEK AVIVA PLUS VI STRP: ORAL_STRIP | 12 refills | Status: DC

## 2021-08-11 ENCOUNTER — Ambulatory Visit (INDEPENDENT_AMBULATORY_CARE_PROVIDER_SITE_OTHER): Payer: Medicare Other | Admitting: Student in an Organized Health Care Education/Training Program

## 2021-08-11 ENCOUNTER — Encounter: Payer: Self-pay | Admitting: Student in an Organized Health Care Education/Training Program

## 2021-08-11 VITALS — BP 153/64 | HR 64 | Temp 98.2°F | Ht 67.0 in | Wt 234.7 lb

## 2021-08-11 DIAGNOSIS — Z9981 Dependence on supplemental oxygen: Secondary | ICD-10-CM

## 2021-08-11 DIAGNOSIS — J9611 Chronic respiratory failure with hypoxia: Secondary | ICD-10-CM | POA: Diagnosis not present

## 2021-08-11 DIAGNOSIS — G4734 Idiopathic sleep related nonobstructive alveolar hypoventilation: Secondary | ICD-10-CM | POA: Diagnosis not present

## 2021-08-11 DIAGNOSIS — E113393 Type 2 diabetes mellitus with moderate nonproliferative diabetic retinopathy without macular edema, bilateral: Secondary | ICD-10-CM

## 2021-08-11 LAB — POCT GLYCOSYLATED HEMOGLOBIN (HGB A1C): Hemoglobin A1C: 8.2 % — AB (ref 4.0–5.6)

## 2021-08-11 LAB — GLUCOSE, CAPILLARY: Glucose-Capillary: 174 mg/dL — ABNORMAL HIGH (ref 70–99)

## 2021-08-11 MED ORDER — PATADAY 0.1 % OP SOLN: 1.0000 [drp] | Freq: Two times a day (BID) | OPHTHALMIC | 1 refills | Status: DC

## 2021-08-11 MED ORDER — TRULICITY 0.75 MG/0.5ML ~~LOC~~ SOAJ: 0.7500 mg | SUBCUTANEOUS | 2 refills | Status: DC

## 2021-08-11 NOTE — Assessment & Plan Note (Addendum)
She continues to use supplemental O2 at home. She is unsure if she is using 3 or 4L during the day and states she uses 4L at night, when she has more difficulty breathing. She is open to a sleep study, will place referral to pulm.   Plan:  -Place referral to pulm for sleep study

## 2021-08-11 NOTE — Progress Notes (Signed)
Attestation for Student Documentation:  I personally was present and performed or re-performed the history, physical exam and medical decision-making activities of this service and have verified that the service and findings are accurately documented in the student's note.  65 year old person here for follow-up of diabetes.  A1c is 8.3%, however in the context of heavy proteinuria would like to push to glycemic control closer to 7%.  We will plan to continue with Lantus 50 units daily, Humalog 15 units daily, and Farxiga 5 mg daily.  She has some issues with adherence, does not like injecting herself in the abdomen, skips on occasion.  She self discontinued the weekly Trulicity for about the last 1 year.  We talked about other medication can play a role in helping with weight loss and improving glycemic control.  Plan will be to restart Trulicity at [id number] mg weekly over the next 3 months, if she tolerates that well can move up to the 1.5 mg dose.  Patient is sensitive to medications and their side effects.  Also dealing with obesity, high suspicion for obstructive sleep apnea given history of nocturnal hypoxemia.  I strongly recommended sleep study, patient agrees, likely will benefit from a self titrating CPAP if she can tolerate, will refer to [facility] pulmonology for sleep study and treatment.  Axel Filler, MD 08/11/2021, 2:27 PM

## 2021-08-11 NOTE — Progress Notes (Signed)
  HPI:  [name] is a 65 y.o. female with a history of chronic respiratory failure requiring supplemental O2, diabetes, hypertension, and HFpef who presents for diabetes and respiratory failure follow-up.   She states she stopped taking Trulicity because she did like the injection and has continued [place], Lantus, and Humalog. She has not been active recently. She is unsure of how much supplement O2 she is using but thinks it is 4L. She has noticed more difficulty breathing at night. She is open to a sleep study. She has had some straining with bowel movements but states that her stools are soft and Senna, which she has been taking every other day, has helped.   Physical Exam:  Vitals:   08/11/21 1019  Weight: 234 lb 11.2 oz (106.5 kg)  Height: 5\' 7"  (1.702 m)   General: Well appearing, in no acute distress  CV: RRR, no murmurs heard Plum: Normal effort, CTAB. No wheezes or crackles heard.  Ext: Warm and dry, 2+ pitting edema present bilaterally in lower extremities  Skin: Chronic psoriatic plaques present on knees and elbows    Assessment & Plan:   See Encounters Tab for problem based charting.  Patient seen with Alicia [name]  Problem List Items Addressed This Visit       Respiratory   Chronic respiratory failure with hypoxia, on home oxygen therapy (Alicia [name]) (Chronic)    She continues to use supplemental O2 at home. She is unsure if she is using 3 or 4L during the day and states she uses 4L at night, when she has more difficulty breathing. She is open to a sleep study, will place referral to Alicia [name].   Plan:  -Place referral to Alicia [name]         Endocrine   Type 2 diabetes mellitus with moderate nonproliferative diabetic retinopathy (HCC) - Primary (Chronic)    A1c today of 8.2%, stable from 8.3% in May. She recently stopped Trulicity because she was tired of the injections. We discussed how she is showing early signs of kidney damage as evidence by proteinuria, although  creatinine is currently normal. She was very concerned by this as her sister is very ill from a kidney-related problem. She agreed to restart Trulicity to help prevent further kidney damage. We will plan to restart Trulicity 0.75mg  weekly and then follow-up in 3 months. Will continue Farxiga 5mg  daily, Lantus 50 units nightly, and Humalog 15 units with breakfast and 20 units with dinner. She has an eye doctor appointment with Alicia [name] on 10/31.   Plan:  -Restart Trulicity 0.75mg  -Continue Farxiga, Lantus, and Humalog -Follow-up in 3 months       Relevant Medications   Dulaglutide (TRULICITY) 6.07 PX/1.0GY SOPN   Other Relevant Orders   POC Hbg A1C (Completed)    Return in about 3 months (around 11/10/2021).

## 2021-08-11 NOTE — Assessment & Plan Note (Signed)
A1c today of 8.2%, stable from 8.3% in May. She recently stopped Trulicity because she was tired of the injections. We discussed how she is showing early signs of kidney damage as evidence by proteinuria, although creatinine is currently normal. She was very concerned by this as her sister is very ill from a kidney-related problem. She agreed to restart Trulicity to help prevent further kidney damage. We will plan to restart Trulicity 0.'75mg'$  weekly and then follow-up in 3 months. Will continue Farxiga '5mg'$  daily, Lantus 50 units nightly, and Humalog 15 units with breakfast and 20 units with dinner. She has an eye doctor appointment with Dr. Katy Fitch on 10/31.   Plan:  -Restart Trulicity 0.'75mg'$  -Continue Farxiga, Lantus, and Humalog -Follow-up in 3 months

## 2021-08-11 NOTE — Patient Instructions (Signed)
Thank you, Ms.Neriyah Poupard Donalson for allowing [name] to provide your care today. Today we discussed your diabetes and trouble breathing.   Diabetes - Please restart the once a week Trulicity injection. Continue all your other diabetes medications, including Farxiga, Lantus, and Humalog. Your kidneys are working right now but showing early signs of damage from diabetes, so it is very important that you take all your medications and try to make healthy diet choices and exercise. Your eye appointment with Dr. Katy Fitch is on 10/31. We will also send in a prescription for your eye drops today to last until you see him. Breathing - Your difficulty breathing at night is likely related to sleep apnea. We will reach out to [facility] to try and get a sleep study scheduled.   I have ordered the following labs for you:   Lab Orders         Glucose, capillary         POC Hbg A1C      I have ordered the following medication/changed the following medications:   Stop the following medications: Medications Discontinued During This Encounter  Medication Reason   PATADAY 0.1 % ophthalmic solution Reorder     Start the following medications: Meds ordered this encounter  Medications   Dulaglutide (TRULICITY) A999333 0000000 SOPN    Sig: Inject 0.75 mg into the skin once a week.    Dispense:  2 mL    Refill:  2   PATADAY 0.1 % ophthalmic solution    Sig: Place 1 drop into both eyes 2 (two) times daily.    Dispense:  5 mL    Refill:  1     Follow up: 3 months   Should you have any questions or concerns please call the internal medicine clinic at [phone].   Lalla Brothers, MD  [place]

## 2021-08-19 DIAGNOSIS — J9611 Chronic respiratory failure with hypoxia: Secondary | ICD-10-CM | POA: Diagnosis not present

## 2021-08-20 ENCOUNTER — Other Ambulatory Visit: Payer: Self-pay | Admitting: Internal Medicine

## 2021-08-20 DIAGNOSIS — E113393 Type 2 diabetes mellitus with moderate nonproliferative diabetic retinopathy without macular edema, bilateral: Secondary | ICD-10-CM

## 2021-09-02 ENCOUNTER — Other Ambulatory Visit: Payer: Self-pay | Admitting: Student in an Organized Health Care Education/Training Program

## 2021-09-02 NOTE — Telephone Encounter (Signed)
Next appt scheduled 11/10/21 with PCP. 

## 2021-09-14 ENCOUNTER — Other Ambulatory Visit: Payer: Self-pay | Admitting: Student in an Organized Health Care Education/Training Program

## 2021-09-18 DIAGNOSIS — J9611 Chronic respiratory failure with hypoxia: Secondary | ICD-10-CM | POA: Diagnosis not present

## 2021-09-22 DIAGNOSIS — H15821 Localized anterior staphyloma, right eye: Secondary | ICD-10-CM | POA: Diagnosis not present

## 2021-09-22 DIAGNOSIS — H34832 Tributary (branch) retinal vein occlusion, left eye, with macular edema: Secondary | ICD-10-CM | POA: Diagnosis not present

## 2021-09-22 DIAGNOSIS — H17822 Peripheral opacity of cornea, left eye: Secondary | ICD-10-CM | POA: Diagnosis not present

## 2021-09-22 DIAGNOSIS — E119 Type 2 diabetes mellitus without complications: Secondary | ICD-10-CM | POA: Diagnosis not present

## 2021-09-22 DIAGNOSIS — H2513 Age-related nuclear cataract, bilateral: Secondary | ICD-10-CM | POA: Diagnosis not present

## 2021-09-22 DIAGNOSIS — H1711 Central corneal opacity, right eye: Secondary | ICD-10-CM | POA: Diagnosis not present

## 2021-09-22 LAB — HM DIABETES EYE EXAM

## 2021-09-23 ENCOUNTER — Encounter (INDEPENDENT_AMBULATORY_CARE_PROVIDER_SITE_OTHER): Payer: Medicare Other | Admitting: Ophthalmology

## 2021-09-23 ENCOUNTER — Other Ambulatory Visit: Payer: Self-pay

## 2021-09-23 DIAGNOSIS — E113312 Type 2 diabetes mellitus with moderate nonproliferative diabetic retinopathy with macular edema, left eye: Secondary | ICD-10-CM | POA: Diagnosis not present

## 2021-09-23 DIAGNOSIS — I10 Essential (primary) hypertension: Secondary | ICD-10-CM | POA: Diagnosis not present

## 2021-09-23 DIAGNOSIS — H4421 Degenerative myopia, right eye: Secondary | ICD-10-CM

## 2021-09-23 DIAGNOSIS — E113391 Type 2 diabetes mellitus with moderate nonproliferative diabetic retinopathy without macular edema, right eye: Secondary | ICD-10-CM | POA: Diagnosis not present

## 2021-09-23 DIAGNOSIS — H34832 Tributary (branch) retinal vein occlusion, left eye, with macular edema: Secondary | ICD-10-CM

## 2021-09-23 DIAGNOSIS — H43813 Vitreous degeneration, bilateral: Secondary | ICD-10-CM

## 2021-09-23 DIAGNOSIS — H35033 Hypertensive retinopathy, bilateral: Secondary | ICD-10-CM | POA: Diagnosis not present

## 2021-09-29 ENCOUNTER — Encounter: Payer: Self-pay | Admitting: *Deleted

## 2021-10-19 DIAGNOSIS — J9611 Chronic respiratory failure with hypoxia: Secondary | ICD-10-CM | POA: Diagnosis not present

## 2021-10-20 ENCOUNTER — Other Ambulatory Visit: Payer: Self-pay

## 2021-10-20 ENCOUNTER — Encounter (INDEPENDENT_AMBULATORY_CARE_PROVIDER_SITE_OTHER): Payer: Medicare Other | Admitting: Ophthalmology

## 2021-10-20 DIAGNOSIS — I10 Essential (primary) hypertension: Secondary | ICD-10-CM

## 2021-10-20 DIAGNOSIS — E113312 Type 2 diabetes mellitus with moderate nonproliferative diabetic retinopathy with macular edema, left eye: Secondary | ICD-10-CM

## 2021-10-20 DIAGNOSIS — H43813 Vitreous degeneration, bilateral: Secondary | ICD-10-CM

## 2021-10-20 DIAGNOSIS — H35033 Hypertensive retinopathy, bilateral: Secondary | ICD-10-CM | POA: Diagnosis not present

## 2021-10-20 DIAGNOSIS — H4421 Degenerative myopia, right eye: Secondary | ICD-10-CM

## 2021-10-20 DIAGNOSIS — E113391 Type 2 diabetes mellitus with moderate nonproliferative diabetic retinopathy without macular edema, right eye: Secondary | ICD-10-CM

## 2021-10-20 DIAGNOSIS — H34832 Tributary (branch) retinal vein occlusion, left eye, with macular edema: Secondary | ICD-10-CM

## 2021-10-30 ENCOUNTER — Other Ambulatory Visit: Payer: Self-pay | Admitting: Student in an Organized Health Care Education/Training Program

## 2021-10-30 DIAGNOSIS — E113393 Type 2 diabetes mellitus with moderate nonproliferative diabetic retinopathy without macular edema, bilateral: Secondary | ICD-10-CM

## 2021-10-30 NOTE — Telephone Encounter (Signed)
Next appt scheduled 11/18/21 with PCP.

## 2021-11-10 ENCOUNTER — Encounter: Payer: Medicare Other | Admitting: Student in an Organized Health Care Education/Training Program

## 2021-11-14 ENCOUNTER — Encounter (INDEPENDENT_AMBULATORY_CARE_PROVIDER_SITE_OTHER): Payer: Medicare Other | Admitting: Ophthalmology

## 2021-11-18 ENCOUNTER — Encounter: Payer: Self-pay | Admitting: Student in an Organized Health Care Education/Training Program

## 2021-11-18 ENCOUNTER — Ambulatory Visit (INDEPENDENT_AMBULATORY_CARE_PROVIDER_SITE_OTHER): Payer: Medicare Other | Admitting: Student in an Organized Health Care Education/Training Program

## 2021-11-18 VITALS — BP 152/68 | HR 64 | Temp 98.4°F | Ht 67.0 in | Wt 231.8 lb

## 2021-11-18 DIAGNOSIS — I10 Essential (primary) hypertension: Secondary | ICD-10-CM | POA: Diagnosis not present

## 2021-11-18 DIAGNOSIS — E113393 Type 2 diabetes mellitus with moderate nonproliferative diabetic retinopathy without macular edema, bilateral: Secondary | ICD-10-CM | POA: Diagnosis not present

## 2021-11-18 DIAGNOSIS — G4734 Idiopathic sleep related nonobstructive alveolar hypoventilation: Secondary | ICD-10-CM

## 2021-11-18 LAB — POCT GLYCOSYLATED HEMOGLOBIN (HGB A1C): Hemoglobin A1C: 7.6 % — AB (ref 4.0–5.6)

## 2021-11-18 LAB — GLUCOSE, CAPILLARY: Glucose-Capillary: 135 mg/dL — ABNORMAL HIGH (ref 70–99)

## 2021-11-18 MED ORDER — HYDROCHLOROTHIAZIDE 25 MG PO TABS: 25.0000 mg | ORAL_TABLET | Freq: Every day | ORAL | 3 refills | Status: DC

## 2021-11-18 MED ORDER — FUROSEMIDE 40 MG PO TABS: 80.0000 mg | ORAL_TABLET | Freq: Every day | ORAL | 3 refills | Status: DC

## 2021-11-18 MED ORDER — TRULICITY 1.5 MG/0.5ML ~~LOC~~ SOAJ: 1.5000 mg | SUBCUTANEOUS | 5 refills | Status: DC

## 2021-11-18 MED ORDER — LANTUS SOLOSTAR 100 UNIT/ML ~~LOC~~ SOPN: 40.0000 [IU] | PEN_INJECTOR | Freq: Every day | SUBCUTANEOUS | 3 refills | Status: DC

## 2021-11-18 NOTE — Assessment & Plan Note (Signed)
History of overnight hypoxemia, but now she is using supplemental oxygen pretty much continuously through the day.  Had a recent upper respiratory tract infection which is caused a lot of congestion and worsening respiratory symptoms.  At last visit I referred her to pulmonology for sleep study, she tells me she has an appointment with them on January 18.

## 2021-11-18 NOTE — Assessment & Plan Note (Signed)
Blood pressure has been persistently elevated.  This is in the setting of chronic HFpEF, previously reduced ejection fraction but improved with medications.  She also has heavy proteinuria, but normal renal function.  We will continue with amlodipine 10 mg and Entresto 200 mg twice daily.  Could not tolerate beta-blocker.  Will restart HCTZ 25 mg daily, this was discontinued around 2015 in the setting of a new reduced EF in order to titrate other medications.  Hopefully this will help with blood pressure control now that she has preserved ejection fraction.  In order to prevent too much diuretic, will decrease Lasix from 80 mg twice daily to just 80 mg once daily.  Follow-up in 3 months and we will check a BMP at that time.

## 2021-11-18 NOTE — Progress Notes (Signed)
° °  Assessment and Plan:  See Encounters tab for problem-based medical decision making.   __________________________________________________________  HPI:   65 year old person living with hypertension and diabetes here for follow-up of those issues.  Doing well since I last saw her, reports good adherence with his medications.  Had a upper respiratory tract infection with cough that lasted for several weeks.  No fevers or chills.  Has had no hospitalizations, no emergency department visits.  No recent falls at home.  Does have occasional dizziness when turning her head in certain situations, no orthostatic symptoms.  No chest pain.  Breathing is stable, had to increase her supplemental oxygen use to 4 L for symptoms during his episodes of cough and congestion.  She did arrange consultation with pulmonology for January to consider self titrating CPAP machine.  __________________________________________________________  Problem List: Patient Active Problem List   Diagnosis Date Noted   Chronic respiratory failure with hypoxia, on home oxygen therapy ([place]) 10/14/2018    Priority: High   Coronary artery disease involving native coronary artery of native heart without angina pectoris 11/09/2014    Priority: High   Chronic combined systolic and diastolic heart failure ([place]) 05/10/2014    Priority: High   Type 2 diabetes mellitus with moderate nonproliferative diabetic retinopathy ([place]) 10/15/2013    Priority: High   Essential hypertension 10/15/2013    Priority: High   Sinoatrial node dysfunction ([place]) 10/17/2018    Priority: Medium    Aortic atherosclerosis ([place]) 09/24/2017    Priority: Medium    Gastroesophageal reflux disease 09/24/2017    Priority: Medium    Obesity (BMI 30.0-34.9) 09/24/2017    Priority: Medium    Hyperlipidemia     Priority: Medium    Encounter for screening for lung cancer 05/13/2020    Priority: Low   Nocturnal hypoxemia 12/30/2018    Priority: Low    Psoriasis 09/24/2017    Priority: Low   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 09/24/2017    Priority: Low   Seasonal allergic rhinitis due to pollen 09/24/2017    Priority: [place] maintenance 09/24/2017    Priority: Low   Uterine prolapse 02/12/2016    Priority: Low    Medications: Reconciled today in Epic __________________________________________________________  Physical Exam:  Vital Signs: Vitals:   11/18/21 0846 11/18/21 0909  BP: (!) 158/70 (!) 152/68  Pulse: 66 64  Temp: 98.4 F (36.9 C)   TempSrc: Oral   SpO2: 98%   Weight: 231 lb 12.8 oz (105.1 kg)   Height: 5\' 7"  (1.702 m)     Gen: Well appearing, NAD CV: RRR, no murmurs Pulm: Normal effort, mild coarse crackles heard at the bilateral bases, no wheezing Ext: Warm, 1+ pitting edema bilateral lower extremities

## 2021-11-18 NOTE — Assessment & Plan Note (Signed)
Hemoglobin A1c nicely improved from 8.2% down to 7.6% with the addition of weekly Trulicity.  She does report having some morning hypoglycemia, symptomatic, on occasion.  Only a few times a month.  Weight mostly stable, down 3 pounds since last visit.  She does have heavy proteinuria so we are trying to push our A1c goal down to is close to 7% as possible.  To treat the fasting hyperglycemia organ to decrease the long-acting Lantus from 50 units nightly to 40 units nightly.  Now that she is tolerating the GLP-1 agonist well, will increase Trulicity to the 1.5 mg once weekly dose.  We will continue with mealtime Humalog 14 units 3 times daily.  And continue with [place].  Follow-up in 3 months and recheck an A1c at that time.

## 2021-11-18 NOTE — Patient Instructions (Addendum)
It was great seeing you in clinic today.  You have made great improvements in your blood sugar control.  We talked about a few changes.  Decrease the Lantus insulin from 50 units nightly to 40 units nightly. Increase Trulicity injection from 0.75 mg on Saturday to 1.5 mg on Saturday.  I have sent a new prescription to your pharmacy for this. For your high blood pressure I have prescribed a new blood pressure medicine called hydrochlorothiazide, take 25 mg once a day.  You have been on this medication in the past and done fairly well with it. Because hydrochlorothiazide is a diuretic, I would like you to decrease the Lasix to just 80 mg once a day instead of twice a day.  If you have any questions or confusion about these medication changes, please do not hesitate to call our office.  I will follow-up with you in 3 months for blood work and a recheck of your blood sugars.

## 2021-11-25 ENCOUNTER — Encounter (INDEPENDENT_AMBULATORY_CARE_PROVIDER_SITE_OTHER): Payer: Medicare Other | Admitting: Ophthalmology

## 2021-11-25 ENCOUNTER — Other Ambulatory Visit: Payer: Self-pay

## 2021-11-25 DIAGNOSIS — E113312 Type 2 diabetes mellitus with moderate nonproliferative diabetic retinopathy with macular edema, left eye: Secondary | ICD-10-CM | POA: Diagnosis not present

## 2021-11-25 DIAGNOSIS — H35033 Hypertensive retinopathy, bilateral: Secondary | ICD-10-CM

## 2021-11-25 DIAGNOSIS — I10 Essential (primary) hypertension: Secondary | ICD-10-CM

## 2021-11-25 DIAGNOSIS — H34832 Tributary (branch) retinal vein occlusion, left eye, with macular edema: Secondary | ICD-10-CM | POA: Diagnosis not present

## 2021-11-25 DIAGNOSIS — E113391 Type 2 diabetes mellitus with moderate nonproliferative diabetic retinopathy without macular edema, right eye: Secondary | ICD-10-CM | POA: Diagnosis not present

## 2021-11-25 DIAGNOSIS — H4421 Degenerative myopia, right eye: Secondary | ICD-10-CM

## 2021-11-25 DIAGNOSIS — H43813 Vitreous degeneration, bilateral: Secondary | ICD-10-CM

## 2021-11-27 ENCOUNTER — Other Ambulatory Visit: Payer: Self-pay | Admitting: Student in an Organized Health Care Education/Training Program

## 2021-11-27 DIAGNOSIS — E113393 Type 2 diabetes mellitus with moderate nonproliferative diabetic retinopathy without macular edema, bilateral: Secondary | ICD-10-CM

## 2021-12-10 ENCOUNTER — Institutional Professional Consult (permissible substitution): Payer: Medicare Other | Admitting: Pulmonary Disease

## 2021-12-22 ENCOUNTER — Encounter (INDEPENDENT_AMBULATORY_CARE_PROVIDER_SITE_OTHER): Payer: Medicare Other | Admitting: Ophthalmology

## 2021-12-22 ENCOUNTER — Other Ambulatory Visit: Payer: Self-pay

## 2021-12-22 DIAGNOSIS — H34832 Tributary (branch) retinal vein occlusion, left eye, with macular edema: Secondary | ICD-10-CM

## 2021-12-22 DIAGNOSIS — H4421 Degenerative myopia, right eye: Secondary | ICD-10-CM

## 2021-12-22 DIAGNOSIS — H35033 Hypertensive retinopathy, bilateral: Secondary | ICD-10-CM

## 2021-12-22 DIAGNOSIS — E113312 Type 2 diabetes mellitus with moderate nonproliferative diabetic retinopathy with macular edema, left eye: Secondary | ICD-10-CM | POA: Diagnosis not present

## 2021-12-22 DIAGNOSIS — I10 Essential (primary) hypertension: Secondary | ICD-10-CM | POA: Diagnosis not present

## 2021-12-22 DIAGNOSIS — E113391 Type 2 diabetes mellitus with moderate nonproliferative diabetic retinopathy without macular edema, right eye: Secondary | ICD-10-CM

## 2021-12-22 DIAGNOSIS — H43813 Vitreous degeneration, bilateral: Secondary | ICD-10-CM

## 2021-12-26 ENCOUNTER — Other Ambulatory Visit: Payer: Self-pay

## 2021-12-26 ENCOUNTER — Ambulatory Visit: Payer: Medicare Other | Admitting: Cardiology

## 2021-12-26 ENCOUNTER — Encounter: Payer: Self-pay | Admitting: Cardiology

## 2021-12-26 DIAGNOSIS — J9611 Chronic respiratory failure with hypoxia: Secondary | ICD-10-CM | POA: Diagnosis not present

## 2021-12-26 DIAGNOSIS — I2510 Atherosclerotic heart disease of native coronary artery without angina pectoris: Secondary | ICD-10-CM | POA: Diagnosis not present

## 2021-12-26 DIAGNOSIS — I5042 Chronic combined systolic (congestive) and diastolic (congestive) heart failure: Secondary | ICD-10-CM

## 2021-12-26 DIAGNOSIS — I495 Sick sinus syndrome: Secondary | ICD-10-CM

## 2021-12-26 DIAGNOSIS — I700 Atherosclerosis of aorta: Secondary | ICD-10-CM | POA: Diagnosis not present

## 2021-12-26 DIAGNOSIS — Z9981 Dependence on supplemental oxygen: Secondary | ICD-10-CM

## 2021-12-26 DIAGNOSIS — E113393 Type 2 diabetes mellitus with moderate nonproliferative diabetic retinopathy without macular edema, bilateral: Secondary | ICD-10-CM
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
## 2021-12-26 NOTE — Assessment & Plan Note (Signed)
NoteSevere COPD.  Continue with home O2.  Reviewed.

## 2021-12-26 NOTE — Assessment & Plan Note (Signed)
Has intermittent junctional rhythm.  No pacemaker requirements at this time.  Asymptomatic

## 2021-12-26 NOTE — Assessment & Plan Note (Signed)
Statin therapy.

## 2021-12-26 NOTE — Patient Instructions (Signed)
Medication Instructions:  The current medical regimen is effective;  continue present plan and medications.  *If you need a refill on your cardiac medications before your next appointment, please call your pharmacy*  Follow-Up: At [facility], you and your health needs are our priority.  As part of our continuing mission to provide you with exceptional heart care, we have created designated Provider Care Teams.  These Care Teams include your primary Cardiologist (physician) and Advanced Practice Providers (APPs -  Physician Assistants and Nurse Practitioners) who all work together to provide you with the care you need, when you need it.  We recommend signing up for the patient portal called "MyChart".  Sign up information is provided on this After Visit Summary.  MyChart is used to connect with patients for Virtual Visits (Telemedicine).  Patients are able to view lab/test results, encounter notes, upcoming appointments, etc.  Non-urgent messages can be sent to your provider as well.   To learn more about what you can do with MyChart, go to [email or web address].    Your next appointment:   6 month(s)  The format for your next appointment:   In Person  Provider:   Nicholes Rough, PA-C, Melina Copa, PA-C, Cecilie Kicks, NP, Ermalinda Barrios, PA-C, Christen Bame, NP, or Richardson Dopp, PA-C     Then, Candee Furbish, MD will plan to see you again in 1 year(s).    Thank you for choosing [facility]!!

## 2021-12-26 NOTE — Assessment & Plan Note (Signed)
Per primary team.  Excellent use of SGLT2 inhibitor in the setting of cardiomyopathy.

## 2021-12-26 NOTE — Assessment & Plan Note (Addendum)
Echo 2020 showed EF of approximately 60% (on second points most likely in the 45% range).  Excellent.  Originally it was 25% in 2015.  Continue with goal-directed medical therapy.  She is on Farxiga, SGLT2 inhibitor excellent.  She is not on a beta-blocker given her severe COPD and reactive airways.  She takes Lasix for decongestion.  She is on Entresto high-dose.

## 2021-12-26 NOTE — Assessment & Plan Note (Signed)
Nonobstructive coronary artery disease in 2015.  Continue with goal-directed medical therapy.  High intensity statin atorvastatin  40.

## 2022-01-19 ENCOUNTER — Encounter (INDEPENDENT_AMBULATORY_CARE_PROVIDER_SITE_OTHER): Payer: Medicare Other | Admitting: Ophthalmology

## 2022-01-19 ENCOUNTER — Other Ambulatory Visit: Payer: Self-pay

## 2022-01-19 DIAGNOSIS — I10 Essential (primary) hypertension: Secondary | ICD-10-CM

## 2022-01-19 DIAGNOSIS — E113391 Type 2 diabetes mellitus with moderate nonproliferative diabetic retinopathy without macular edema, right eye: Secondary | ICD-10-CM | POA: Diagnosis not present

## 2022-01-19 DIAGNOSIS — H35033 Hypertensive retinopathy, bilateral: Secondary | ICD-10-CM | POA: Diagnosis not present

## 2022-01-19 DIAGNOSIS — E113312 Type 2 diabetes mellitus with moderate nonproliferative diabetic retinopathy with macular edema, left eye: Secondary | ICD-10-CM

## 2022-01-19 DIAGNOSIS — H34832 Tributary (branch) retinal vein occlusion, left eye, with macular edema: Secondary | ICD-10-CM | POA: Diagnosis not present

## 2022-01-19 DIAGNOSIS — H43813 Vitreous degeneration, bilateral: Secondary | ICD-10-CM

## 2022-01-19 DIAGNOSIS — H4421 Degenerative myopia, right eye: Secondary | ICD-10-CM

## 2022-01-30 ENCOUNTER — Institutional Professional Consult (permissible substitution): Payer: Medicare Other | Admitting: Pulmonary Disease

## 2022-02-09 ENCOUNTER — Encounter: Payer: Self-pay | Admitting: Student in an Organized Health Care Education/Training Program

## 2022-02-09 ENCOUNTER — Ambulatory Visit (INDEPENDENT_AMBULATORY_CARE_PROVIDER_SITE_OTHER): Payer: Medicare Other | Admitting: Student in an Organized Health Care Education/Training Program

## 2022-02-09 VITALS — BP 153/67 | HR 70 | Temp 98.1°F | Ht 67.0 in | Wt 225.1 lb

## 2022-02-09 DIAGNOSIS — E113393 Type 2 diabetes mellitus with moderate nonproliferative diabetic retinopathy without macular edema, bilateral: Secondary | ICD-10-CM | POA: Diagnosis not present

## 2022-02-09 DIAGNOSIS — I10 Essential (primary) hypertension: Secondary | ICD-10-CM | POA: Diagnosis not present

## 2022-02-09 DIAGNOSIS — Z122 Encounter for screening for malignant neoplasm of respiratory organs: Secondary | ICD-10-CM

## 2022-02-09 DIAGNOSIS — J9611 Chronic respiratory failure with hypoxia: Secondary | ICD-10-CM | POA: Diagnosis not present

## 2022-02-09 DIAGNOSIS — Z9981 Dependence on supplemental oxygen: Secondary | ICD-10-CM

## 2022-02-09 DIAGNOSIS — Z0000 Encounter for general adult medical examination without abnormal findings: Secondary | ICD-10-CM

## 2022-02-09 DIAGNOSIS — Z23 Encounter for immunization: Secondary | ICD-10-CM

## 2022-02-09 NOTE — Assessment & Plan Note (Signed)
Patient is 66 years old with a 30-pack-year history of smoking, quit in 2020.  We started lung cancer screening in July 2021, missed the scan last year, patient still interested in this process.  Will obtain another low-dose CT scan for lung cancer screening. ?

## 2022-02-09 NOTE — Assessment & Plan Note (Signed)
Historically okay control.  We will check an A1c today, unfortunately has to be a send out.  I reviewed her blood sugar logs which show good fasting levels in the morning, and occasional hyperglycemia but overall okay control postprandially as well.  She is tolerating the higher dose of GLP-1 agonist well.  Weight is down about 10 pounds since last year.  Plan to continue with Trulicity 1.5 mg weekly, Lantus 40 units nightly, Humalog 14 units with meals. ?

## 2022-02-09 NOTE — Progress Notes (Signed)
? ?  Assessment and Plan: ? ?See Encounters tab for problem-based medical decision making.  ? ?__________________________________________________________ ? ?HPI: ? ? ?66 year old person here for follow-up of diabetes and hypertension.  Its been about 4 months since I last seen her.  She has been doing well, no recent hospitalizations, ED visits, or exacerbations.  She followed up with cardiology who is comanaging her heart failure with preserved ejection fraction and prior ischemic vascular disease.  She is tolerating her medications well.  At her last visit we made some changes including increasing the dose of Trulicity, and some small adjustments to her insulin regimen.  She is tolerating these changes well.  Denies any new symptoms, no hypoglycemia.  We reviewed her glucose logs.  She does not check blood pressure at home.  Denies any chest pain, angina.  She currently has NYHA class II symptoms, short of breath after walking about 200 feet.  Not function limiting, independent in all her activities of daily living. ? ?__________________________________________________________ ? ?Problem List: ?Patient Active Problem List  ? Diagnosis Date Noted  ? Chronic respiratory failure with hypoxia, on home oxygen therapy ([place]) 10/14/2018  ?  Priority: High  ? Coronary artery disease involving native coronary artery of native heart without angina pectoris 11/09/2014  ?  Priority: High  ? Chronic combined systolic and diastolic heart failure ([place]) 05/10/2014  ?  Priority: High  ? Type 2 diabetes mellitus with moderate nonproliferative diabetic retinopathy ([place]) 10/15/2013  ?  Priority: High  ? Essential hypertension 10/15/2013  ?  Priority: High  ? Sinoatrial node dysfunction ([place]) 10/17/2018  ?  Priority: Medium   ? Aortic atherosclerosis ([place]) 09/24/2017  ?  Priority: Medium   ? Gastroesophageal reflux disease 09/24/2017  ?  Priority: Medium   ? Obesity (BMI 30.0-34.9) 09/24/2017  ?  Priority: Medium   ? Hyperlipidemia   ?   Priority: Medium   ? Encounter for screening for lung cancer 05/13/2020  ?  Priority: Low  ? Nocturnal hypoxemia 12/30/2018  ?  Priority: Low  ? Psoriasis 09/24/2017  ?  Priority: Low  ? 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  Priority: Low  ? Seasonal allergic rhinitis due to pollen 09/24/2017  ?  Priority: Low  ? Healthcare maintenance 09/24/2017  ?  Priority: Low  ? Uterine prolapse 02/12/2016  ?  Priority: Low  ? ? ?Medications: Reconciled today in Epic ?__________________________________________________________ ? ?Physical Exam:  ?Vital Signs: ?Vitals:  ? 02/09/22 1022 02/09/22 1040  ?BP: (!) 158/68 (!) 153/67  ?Pulse: 70 70  ?Temp: 98.1 ?F (36.7 ?C)   ?TempSrc: Oral   ?SpO2: 94%   ?Weight: 225 lb 1.6 oz (102.1 kg)   ?Height: '5\' 7"'$  (1.702 m)   ? ? ?Gen: Well appearing, NAD ?Neck: No cervical LAD, No thyromegaly or nodules, No JVD. ?CV: RRR, no murmurs ?Pulm: Normal effort, CTA throughout, no wheezing ?Abd: Soft, NT, ND ?Ext: Warm, no edema, normal joints ? ? ?

## 2022-02-09 NOTE — Assessment & Plan Note (Signed)
Symptomatically stable, pretty good exertional capacity.  Patient stopped using the combination LABA/ICS, not interested in restarting at this time.  Pulmonary function testing in 2019 was not consistent with COPD, rather a restrictive process like obesity hypoventilation syndrome.  Patient ultimately decided not to follow-up with pulmonology or have sleep study completed.  At this time she is overall satisfied with her current lung function.  We talked about further monitoring and this will revisit this in the future if she has more symptoms or exacerbations. ?

## 2022-02-09 NOTE — Assessment & Plan Note (Addendum)
Up-to-date on routine cancer screening, due for low-dose lung cancer screening CT scan this year.  Gave her high-dose flu vaccine. ?

## 2022-02-09 NOTE — Assessment & Plan Note (Signed)
Blood pressure little elevated above goal today but no easy changes available in her medications.  Currently tolerating maximum dose Entresto, amlodipine, and HCTZ.  Having some symptoms of orthostatic dizziness on occasion, so will not escalate or add spironolactone for now.  Given her history of HFpEF, adding Spyro in the future might be beneficial.  We will check a BMP today.  Recommended home blood pressure checks, keep a log, and we will review those at next visit. ?

## 2022-02-10 ENCOUNTER — Encounter: Payer: Self-pay | Admitting: Student in an Organized Health Care Education/Training Program

## 2022-02-10 ENCOUNTER — Other Ambulatory Visit: Payer: Self-pay | Admitting: Student in an Organized Health Care Education/Training Program

## 2022-02-10 DIAGNOSIS — E113393 Type 2 diabetes mellitus with moderate nonproliferative diabetic retinopathy without macular edema, bilateral: Secondary | ICD-10-CM

## 2022-02-10 LAB — BMP8+ANION GAP
Anion Gap: 15 mmol/L (ref 10.0–18.0)
BUN/Creatinine Ratio: 24 (ref 12–28)
BUN: 20 mg/dL (ref 8–27)
CO2: 30 mmol/L — ABNORMAL HIGH (ref 20–29)
Calcium: 9.3 mg/dL (ref 8.7–10.3)
Chloride: 94 mmol/L — ABNORMAL LOW (ref 96–106)
Creatinine, Ser: 0.84 mg/dL (ref 0.57–1.00)
Glucose: 95 mg/dL (ref 70–99)
Potassium: 4 mmol/L (ref 3.5–5.2)
Sodium: 139 mmol/L (ref 134–144)
eGFR: 78 mL/min/{1.73_m2} (ref >59)

## 2022-02-10 LAB — LIPID PANEL
Chol/HDL Ratio: 3.9 ratio (ref 0.0–4.4)
Cholesterol, Total: 185 mg/dL (ref 100–199)
HDL: 47 mg/dL (ref >39)
LDL Chol Calc (NIH): 110 mg/dL — ABNORMAL HIGH (ref 0–99)
Triglycerides: 159 mg/dL — ABNORMAL HIGH (ref 0–149)
VLDL Cholesterol Cal: 28 mg/dL (ref 5–40)

## 2022-02-10 LAB — HEMOGLOBIN A1C
Est. average glucose Bld gHb Est-mCnc: 186 mg/dL
Hgb A1c MFr Bld: 8.1 % — ABNORMAL HIGH (ref 4.8–5.6)

## 2022-02-16 ENCOUNTER — Encounter: Payer: Medicare Other | Admitting: Student in an Organized Health Care Education/Training Program

## 2022-02-16 ENCOUNTER — Other Ambulatory Visit: Payer: Self-pay

## 2022-02-16 ENCOUNTER — Encounter (INDEPENDENT_AMBULATORY_CARE_PROVIDER_SITE_OTHER): Payer: Medicare Other | Admitting: Ophthalmology

## 2022-02-16 DIAGNOSIS — I10 Essential (primary) hypertension: Secondary | ICD-10-CM

## 2022-02-16 DIAGNOSIS — H4421 Degenerative myopia, right eye: Secondary | ICD-10-CM

## 2022-02-16 DIAGNOSIS — H2513 Age-related nuclear cataract, bilateral: Secondary | ICD-10-CM

## 2022-02-16 DIAGNOSIS — H43813 Vitreous degeneration, bilateral: Secondary | ICD-10-CM | POA: Diagnosis not present

## 2022-02-16 DIAGNOSIS — H35033 Hypertensive retinopathy, bilateral: Secondary | ICD-10-CM | POA: Diagnosis not present

## 2022-02-16 DIAGNOSIS — H34832 Tributary (branch) retinal vein occlusion, left eye, with macular edema: Secondary | ICD-10-CM

## 2022-03-06 ENCOUNTER — Ambulatory Visit (HOSPITAL_COMMUNITY): Payer: Medicare Other

## 2022-03-16 ENCOUNTER — Encounter (INDEPENDENT_AMBULATORY_CARE_PROVIDER_SITE_OTHER): Payer: Medicare Other | Admitting: Ophthalmology

## 2022-03-16 DIAGNOSIS — H4421 Degenerative myopia, right eye: Secondary | ICD-10-CM | POA: Diagnosis not present

## 2022-03-16 DIAGNOSIS — H2513 Age-related nuclear cataract, bilateral: Secondary | ICD-10-CM

## 2022-03-16 DIAGNOSIS — H35033 Hypertensive retinopathy, bilateral: Secondary | ICD-10-CM

## 2022-03-16 DIAGNOSIS — I10 Essential (primary) hypertension: Secondary | ICD-10-CM

## 2022-03-16 DIAGNOSIS — H34832 Tributary (branch) retinal vein occlusion, left eye, with macular edema: Secondary | ICD-10-CM

## 2022-03-16 DIAGNOSIS — H43813 Vitreous degeneration, bilateral: Secondary | ICD-10-CM | POA: Diagnosis not present

## 2022-03-16 DIAGNOSIS — E113391 Type 2 diabetes mellitus with moderate nonproliferative diabetic retinopathy without macular edema, right eye: Secondary | ICD-10-CM

## 2022-03-20 ENCOUNTER — Ambulatory Visit (HOSPITAL_COMMUNITY): Payer: Medicare Other

## 2022-04-13 ENCOUNTER — Encounter (INDEPENDENT_AMBULATORY_CARE_PROVIDER_SITE_OTHER): Payer: Medicare Other | Admitting: Ophthalmology

## 2022-04-13 DIAGNOSIS — H2513 Age-related nuclear cataract, bilateral: Secondary | ICD-10-CM

## 2022-04-13 DIAGNOSIS — H35033 Hypertensive retinopathy, bilateral: Secondary | ICD-10-CM

## 2022-04-13 DIAGNOSIS — E113391 Type 2 diabetes mellitus with moderate nonproliferative diabetic retinopathy without macular edema, right eye: Secondary | ICD-10-CM

## 2022-04-13 DIAGNOSIS — I10 Essential (primary) hypertension: Secondary | ICD-10-CM | POA: Diagnosis not present

## 2022-04-13 DIAGNOSIS — H4421 Degenerative myopia, right eye: Secondary | ICD-10-CM

## 2022-04-13 DIAGNOSIS — H43813 Vitreous degeneration, bilateral: Secondary | ICD-10-CM

## 2022-04-13 DIAGNOSIS — H34832 Tributary (branch) retinal vein occlusion, left eye, with macular edema: Secondary | ICD-10-CM | POA: Diagnosis not present

## 2022-04-27 ENCOUNTER — Other Ambulatory Visit: Payer: Self-pay | Admitting: Student in an Organized Health Care Education/Training Program

## 2022-04-27 DIAGNOSIS — Z1231 Encounter for screening mammogram for malignant neoplasm of breast: Secondary | ICD-10-CM

## 2022-05-02 ENCOUNTER — Other Ambulatory Visit: Payer: Self-pay | Admitting: Student in an Organized Health Care Education/Training Program

## 2022-05-02 DIAGNOSIS — E113393 Type 2 diabetes mellitus with moderate nonproliferative diabetic retinopathy without macular edema, bilateral: Secondary | ICD-10-CM

## 2022-05-10 ENCOUNTER — Other Ambulatory Visit: Payer: Self-pay | Admitting: Student in an Organized Health Care Education/Training Program

## 2022-05-10 DIAGNOSIS — E113393 Type 2 diabetes mellitus with moderate nonproliferative diabetic retinopathy without macular edema, bilateral: Secondary | ICD-10-CM

## 2022-05-10 DIAGNOSIS — I10 Essential (primary) hypertension: Secondary | ICD-10-CM

## 2022-05-11 ENCOUNTER — Ambulatory Visit (INDEPENDENT_AMBULATORY_CARE_PROVIDER_SITE_OTHER): Payer: Medicare Other | Admitting: Student in an Organized Health Care Education/Training Program

## 2022-05-11 ENCOUNTER — Encounter: Payer: Self-pay | Admitting: Student in an Organized Health Care Education/Training Program

## 2022-05-11 VITALS — BP 138/63 | HR 65 | Temp 98.3°F | Ht 67.0 in | Wt 223.9 lb

## 2022-05-11 DIAGNOSIS — I5042 Chronic combined systolic (congestive) and diastolic (congestive) heart failure: Secondary | ICD-10-CM | POA: Diagnosis not present

## 2022-05-11 DIAGNOSIS — J9611 Chronic respiratory failure with hypoxia: Secondary | ICD-10-CM

## 2022-05-11 DIAGNOSIS — Z87891 Personal history of nicotine dependence: Secondary | ICD-10-CM

## 2022-05-11 DIAGNOSIS — I110 Hypertensive heart disease with heart failure: Secondary | ICD-10-CM | POA: Diagnosis not present

## 2022-05-11 DIAGNOSIS — I10 Essential (primary) hypertension: Secondary | ICD-10-CM

## 2022-05-11 DIAGNOSIS — Z9981 Dependence on supplemental oxygen: Secondary | ICD-10-CM

## 2022-05-11 DIAGNOSIS — E113393 Type 2 diabetes mellitus with moderate nonproliferative diabetic retinopathy without macular edema, bilateral: Secondary | ICD-10-CM

## 2022-05-11 DIAGNOSIS — Z794 Long term (current) use of insulin: Secondary | ICD-10-CM

## 2022-05-11 LAB — POCT GLYCOSYLATED HEMOGLOBIN (HGB A1C): Hemoglobin A1C: 7.9 % — AB (ref 4.0–5.6)

## 2022-05-11 LAB — GLUCOSE, CAPILLARY: Glucose-Capillary: 94 mg/dL (ref 70–99)

## 2022-05-11 MED ORDER — LANTUS SOLOSTAR 100 UNIT/ML ~~LOC~~ SOPN: 40.0000 [IU] | PEN_INJECTOR | Freq: Every day | SUBCUTANEOUS | 3 refills | Status: DC

## 2022-05-11 NOTE — Progress Notes (Signed)
   Established Patient Office Visit  Subjective   Patient ID: Alicia [name], female    DOB: 1956/03/27  Age: 66 y.o. MRN: [id number]  Chief Complaint  Patient presents with   Medication Refill   Diabetes    HPI  66 year old person living with obesity, chronic hypoxia on supplemental oxygen, chronic HFpEF here for follow up of hypertension and diabetes. She reports good adherence with medications including insulin. We reviewed her glucose logs which showed many days in good range, a few outlier days with hyperglycemia that she attributes to diet choices. She reports her breathing has been stable. Swelling in her legs also has been good recently. She is having a few episodes of dizziness each week, only last a few seconds during activity like cooking. No chest pain or DOE, no orthopnea. No falls. No recent fevers, hospitalizations, ED visits of medication changes. She does not check her blood pressure at home.     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 Will continue with Lantus 40 nightly, humalog 14 with meals, Trulicity weekly, and farxiga '5mg'$  daily. She does have diabetes complications of retinopathy and microalbuminuria. Will recheck urine microalbumin today,  continue with SGLT2 inhibitor and ARB for renal protection.       Relevant Medications   insulin glargine (LANTUS SOLOSTAR) 100 UNIT/ML Solostar Pen   Other Relevant Orders   POC Hbg A1C (Completed)   Microalbumin / Creatinine Urine Ratio   Essential hypertension (Chronic)    Blood pressure well controlled today. Plan to continue with Entresto '200mg'$  bid, HCTZ '25mg'$ , and amlodipine '10mg'$  daily. Renal function has been stable.      Chronic combined systolic and diastolic heart failure (HCC) (Chronic)    Chronic HFpEF likely related to long standing hypertension, doing well today, appears well compensated. Will plan to continue with Entresto and daily lasix to maintain decongestion. She has follow up planned with cardiology clinic.      Chronic respiratory failure with hypoxia, on home oxygen therapy (HCC) (Chronic)    Stable issue on supplemental oxygen at home. She missed March appointment with Dr. Halford Chessman and Rafael Bihari, which I encouraged her to reschedule. She stopped smoking in 2020, chooses not to use inhaler medications at this time, prior PFTs showed restrictive lung disease most likely related to obesity hypoventilation syndrome. I encouraged her to follow up for sleep study and treatment of likely sleep apnea.       Return in about 3 months (around 08/11/2022).    Axel Filler, MD

## 2022-05-11 NOTE — Assessment & Plan Note (Signed)
Blood pressure well controlled today. Plan to continue with Entresto '200mg'$  bid, HCTZ '25mg'$ , and amlodipine '10mg'$  daily. Renal function has been stable.

## 2022-05-11 NOTE — Assessment & Plan Note (Addendum)
Stable issue on supplemental oxygen at home. She missed March appointment with Dr. Halford Chessman and Rafael Bihari, which I encouraged her to reschedule. She stopped smoking in 2020, chooses not to use inhaler medications at this time, prior PFTs showed restrictive lung disease most likely related to obesity hypoventilation syndrome. I encouraged her to follow up for sleep study and treatment of likely sleep apnea.

## 2022-05-11 NOTE — Assessment & Plan Note (Signed)
Chronic HFpEF likely related to long standing hypertension, doing well today, appears well compensated. Will plan to continue with Entresto and daily lasix to maintain decongestion. She has follow up planned with cardiology clinic.

## 2022-05-11 NOTE — Patient Instructions (Signed)
Continue all your medicines as we discussed.   During the next dizzy episode, please check your blood pressure, heart rate, and oxygen saturation.   Please call [facility] to schedule your next follow up visit.

## 2022-05-11 NOTE — Assessment & Plan Note (Signed)
Diabetes control is improving, A1c down to 7.9% which essentially is at goal given her comorbidities. Will continue with Lantus 40 nightly, humalog 14 with meals, Trulicity weekly, and farxiga '5mg'$  daily. She does have diabetes complications of retinopathy and microalbuminuria. Will recheck urine microalbumin today, continue with SGLT2 inhibitor and ARB for renal protection.

## 2022-05-12 ENCOUNTER — Encounter (INDEPENDENT_AMBULATORY_CARE_PROVIDER_SITE_OTHER): Payer: Medicare Other | Admitting: Ophthalmology

## 2022-05-12 DIAGNOSIS — H4421 Degenerative myopia, right eye: Secondary | ICD-10-CM

## 2022-05-12 DIAGNOSIS — H35033 Hypertensive retinopathy, bilateral: Secondary | ICD-10-CM | POA: Diagnosis not present

## 2022-05-12 DIAGNOSIS — I10 Essential (primary) hypertension: Secondary | ICD-10-CM

## 2022-05-12 DIAGNOSIS — H43813 Vitreous degeneration, bilateral: Secondary | ICD-10-CM | POA: Diagnosis not present

## 2022-05-12 DIAGNOSIS — H34832 Tributary (branch) retinal vein occlusion, left eye, with macular edema: Secondary | ICD-10-CM

## 2022-05-12 LAB — MICROALBUMIN / CREATININE URINE RATIO
Creatinine, Urine: 52.2 mg/dL
Microalb/Creat Ratio: 3274 mg/g creat — ABNORMAL HIGH (ref 0–29)
Microalbumin, Urine: 1709.1 ug/mL

## 2022-05-28 ENCOUNTER — Ambulatory Visit: Payer: Medicare Other

## 2022-06-09 ENCOUNTER — Ambulatory Visit: Payer: Medicare Other

## 2022-06-09 ENCOUNTER — Encounter (INDEPENDENT_AMBULATORY_CARE_PROVIDER_SITE_OTHER): Payer: Medicare Other | Admitting: Ophthalmology

## 2022-06-09 DIAGNOSIS — I10 Essential (primary) hypertension: Secondary | ICD-10-CM

## 2022-06-09 DIAGNOSIS — H35033 Hypertensive retinopathy, bilateral: Secondary | ICD-10-CM | POA: Diagnosis not present

## 2022-06-09 DIAGNOSIS — H34832 Tributary (branch) retinal vein occlusion, left eye, with macular edema: Secondary | ICD-10-CM | POA: Diagnosis not present

## 2022-06-09 DIAGNOSIS — H4421 Degenerative myopia, right eye: Secondary | ICD-10-CM | POA: Diagnosis not present

## 2022-06-09 DIAGNOSIS — H43813 Vitreous degeneration, bilateral: Secondary | ICD-10-CM | POA: Diagnosis not present

## 2022-06-10 ENCOUNTER — Ambulatory Visit: Payer: Medicare Other

## 2022-06-18 DIAGNOSIS — J9611 Chronic respiratory failure with hypoxia: Secondary | ICD-10-CM | POA: Diagnosis not present

## 2022-06-25 ENCOUNTER — Encounter: Payer: Self-pay | Admitting: Nurse Practitioner

## 2022-06-25 ENCOUNTER — Ambulatory Visit (INDEPENDENT_AMBULATORY_CARE_PROVIDER_SITE_OTHER): Payer: Medicare Other | Admitting: Nurse Practitioner

## 2022-06-25 VITALS — BP 140/70 | HR 66 | Ht 67.0 in | Wt 223.0 lb

## 2022-06-25 DIAGNOSIS — I2510 Atherosclerotic heart disease of native coronary artery without angina pectoris: Secondary | ICD-10-CM

## 2022-06-25 DIAGNOSIS — I700 Atherosclerosis of aorta: Secondary | ICD-10-CM | POA: Diagnosis not present

## 2022-06-25 DIAGNOSIS — Z9981 Dependence on supplemental oxygen: Secondary | ICD-10-CM | POA: Diagnosis not present

## 2022-06-25 DIAGNOSIS — J9611 Chronic respiratory failure with hypoxia: Secondary | ICD-10-CM | POA: Diagnosis not present

## 2022-06-25 DIAGNOSIS — R072 Precordial pain: Secondary | ICD-10-CM

## 2022-06-25 DIAGNOSIS — E785 Hyperlipidemia, unspecified: Secondary | ICD-10-CM

## 2022-06-25 DIAGNOSIS — I5042 Chronic combined systolic (congestive) and diastolic (congestive) heart failure: Secondary | ICD-10-CM | POA: Diagnosis not present

## 2022-06-25 LAB — BASIC METABOLIC PANEL
BUN/Creatinine Ratio: 22 (ref 12–28)
BUN: 20 mg/dL (ref 8–27)
CO2: 31 mmol/L — ABNORMAL HIGH (ref 20–29)
Calcium: 9.2 mg/dL (ref 8.7–10.3)
Chloride: 94 mmol/L — ABNORMAL LOW (ref 96–106)
Creatinine, Ser: 0.9 mg/dL (ref 0.57–1.00)
Glucose: 154 mg/dL — ABNORMAL HIGH (ref 70–99)
Potassium: 4.2 mmol/L (ref 3.5–5.2)
Sodium: 138 mmol/L (ref 134–144)
eGFR: 71 mL/min/{1.73_m2} (ref >59)

## 2022-06-25 MED ORDER — METOPROLOL TARTRATE 100 MG PO TABS: ORAL_TABLET | ORAL | 0 refills | Status: DC

## 2022-06-25 MED ORDER — ATORVASTATIN CALCIUM 80 MG PO TABS: 80.0000 mg | ORAL_TABLET | Freq: Every day | ORAL | 3 refills | Status: DC

## 2022-06-25 NOTE — Progress Notes (Signed)
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 Contact a dietitian for more information. What foods should I limit? This is a list of foods that should be eaten rarely or only on special occasions. Fruits Fruit canned in syrup. Vegetables Deep-fried potatoes (french fries). Grains Prepackaged pasta or rice dishes. Prepackaged cereal with added sugar. Prepackaged snacks with  added sugar. Meats and other proteins Beef. Pork. Lamb. Poultry with skin. Hot dogs. Berniece Salines. Dairy Ice cream. Sour cream. Whole milk. Fats and oils Butter. Canola oil. Vegetable oil. Beef fat (tallow). Lard. Beverages Juice. Sugar-sweetened soft drinks. Beer. Liquor and spirits. Sweets and desserts Cookies. Cakes. Pies. Candy. Seasonings and condiments Mayonnaise. Pre-made sauces and marinades. The items listed above may not be a complete list of foods and beverages you should limit. Contact a dietitian for more information. Summary The Mediterranean diet includes both food and lifestyle choices. Eat a variety of fresh fruits and vegetables, beans, nuts, seeds, and whole grains. Limit the amount of red meat and sweets that you eat. If recommended by your health care provider, drink red wine in moderation. This means 1 glass a day for nonpregnant women and 2 glasses a day for men. A glass of wine equals 5 oz (150 mL). This information is not intended to replace advice given to you by your health care provider. Make sure you discuss any questions you have with your health care provider. Document Revised: 12/15/2019 Document Reviewed: 10/12/2019 Elsevier Patient Education  [place]         Signed, Emmaline Life, NP  06/25/2022 12:54 PM    [facility]

## 2022-06-25 NOTE — Patient Instructions (Signed)
Medication Instructions:  Your physician has recommended you make the following change in your medication:   INCREASE the Atorvastatin to 80 mg taking 1 daily.  You can take 2 of the 40 mg tablets daily to use them up  *If you need a refill on your cardiac medications before your next appointment, please call your pharmacy*   Lab Work: TODAY:  BMET  2-3 MONTHS:  FASTING LIPID & ALT  If you have labs (blood work) drawn today and your tests are completely normal, you will receive your results only by: [place] (if you have MyChart) OR A paper copy in the mail If you have any lab test that is abnormal or we need to change your treatment, we will call you to review the results.   Testing/Procedures: Your physician has requested that you have cardiac CT. Cardiac computed tomography (CT) is a painless test that uses an x-ray machine to take clear, detailed pictures of your heart. For further information please visit [email or web address]. Please follow instruction sheet BELOW:    Your cardiac CT will be scheduled at one of the below locations:   [facility] [address] [place], [place] [address] [phone]  If scheduled at [facility], please arrive at the [facility] Entrance (Entrance C2) of [facility] 30 minutes prior to test start time. You can use the FREE valet parking offered at entrance C (encouraged to control the heart rate for the test)  Proceed to the [facility] Radiology Department (first floor) to check-in and test prep.  All radiology patients and guests should use entrance C2 at [facility], accessed from [facility], even though the hospital's physical address listed is [address].    Please follow these instructions carefully (unless otherwise directed):  On the Night Before the Test: Be sure to Drink plenty of water. Do not consume any caffeinated/decaffeinated beverages or  chocolate 12 hours prior to your test. Do not take any antihistamines 12 hours prior to your test. If the patient has contrast allergy: Patient will need a prescription for Prednisone and very clear instructions (as follows): Prednisone 50 mg - take 13 hours prior to test Take another Prednisone 50 mg 7 hours prior to test Take another Prednisone 50 mg 1 hour prior to test Take Benadryl 50 mg 1 hour prior to test Patient must complete all four doses of above prophylactic medications. Patient will need a ride after test due to Benadryl.  On the Day of the Test: Drink plenty of water until 1 hour prior to the test. Do not eat any food 4 hours prior to the test. You may take your regular medications prior to the test.  Take metoprolol (Lopressor) 100 MG two hours prior to test.  IF YOU ARE WHEEZING THE DAY BEFORE YOUR CT OR THE MORNING OF, CALL THE OFFICE HOLD Furosemide/Hydrochlorothiazide morning of the test. FEMALES- please wear underwire-free bra if available, avoid dresses & tight clothing       After the Test: Drink plenty of water. After receiving IV contrast, you may experience a mild flushed feeling. This is normal. On occasion, you may experience a mild rash up to 24 hours after the test. This is not dangerous. If this occurs, you can take Benadryl 25 mg and increase your fluid intake. If you experience trouble breathing, this can be serious. If it is severe call 911 IMMEDIATELY. If it is mild, please call our office. If you  take any of these medications: Glipizide/Metformin, Avandament, Glucavance, please do not take 48 hours after completing test unless otherwise instructed.  We will call to schedule your test 2-4 weeks out understanding that some insurance companies will need an authorization prior to the service being performed.   For non-scheduling related questions, please contact the cardiac imaging nurse navigator should you have any questions/concerns: Marchia Bond,  Cardiac Imaging Nurse Navigator Gordy Clement, Cardiac Imaging Nurse Navigator [facility] Direct Office Dial: [phone]   For scheduling needs, including cancellations and rescheduling, please call [place], [phone].     Follow-Up: At [facility], you and your health needs are our priority.  As part of our continuing mission to provide you with exceptional heart care, we have created designated Provider Care Teams.  These Care Teams include your primary Cardiologist (physician) and Advanced Practice Providers (APPs -  Physician Assistants and Nurse Practitioners) who all work together to provide you with the care you need, when you need it.  We recommend signing up for the patient portal called "MyChart".  Sign up information is provided on this After Visit Summary.  MyChart is used to connect with patients for Virtual Visits (Telemedicine).  Patients are able to view lab/test results, encounter notes, upcoming appointments, etc.  Non-urgent messages can be sent to your provider as well.   To learn more about what you can do with MyChart, go to [email or web address].    Your next appointment:   6 month(s)  The format for your next appointment:   In Person  Provider:   Candee Furbish, MD     Other Instructions Mediterranean Diet A Mediterranean diet refers to food and lifestyle choices that are based on the traditions of countries located on the [place]. It focuses on eating more fruits, vegetables, whole grains, beans, nuts, seeds, and heart-healthy fats, and eating less dairy, meat, eggs, and processed foods with added sugar, salt, and fat. This way of eating has been shown to help prevent certain conditions and improve outcomes for people who have chronic diseases, like kidney disease and heart disease. What are tips for following this plan? Reading food labels Check the serving size of packaged foods. For foods such as rice and pasta,  the serving size refers to the amount of cooked product, not dry. Check the total fat in packaged foods. Avoid foods that have saturated fat or trans fats. Check the ingredient list for added sugars, such as corn syrup. Shopping  Buy a variety of foods that offer a balanced diet, including: Fresh fruits and vegetables (produce). Grains, beans, nuts, and seeds. Some of these may be available in unpackaged forms or large amounts (in bulk). Fresh seafood. Poultry and eggs. Low-fat dairy products. Buy whole ingredients instead of prepackaged foods. Buy fresh fruits and vegetables in-season from local farmers markets. Buy plain frozen fruits and vegetables. If you do not have access to quality fresh seafood, buy precooked frozen shrimp or canned fish, such as tuna, salmon, or sardines. Stock your pantry so you always have certain foods on hand, such as olive oil, canned tuna, canned tomatoes, rice, pasta, and beans. Cooking Cook foods with extra-virgin olive oil instead of using butter or other vegetable oils. Have meat as a side dish, and have vegetables or grains as your main dish. This means having meat in small portions or adding small amounts of meat to foods like pasta or stew. Use beans or vegetables instead of meat in common dishes like  chili or lasagna. Experiment with different cooking methods. Try roasting, broiling, steaming, and sauting vegetables. Add frozen vegetables to soups, stews, pasta, or rice. Add nuts or seeds for added healthy fats and plant protein at each meal. You can add these to yogurt, salads, or vegetable dishes. Marinate fish or vegetables using olive oil, lemon juice, garlic, and fresh herbs. Meal planning Plan to eat one vegetarian meal one day each week. Try to work up to two vegetarian meals, if possible. Eat seafood two or more times a week. Have healthy snacks readily available, such as: Vegetable sticks with hummus. Greek yogurt. Fruit and nut trail  mix. Eat balanced meals throughout the week. This includes: Fruit: 2-3 servings a day. Vegetables: 4-5 servings a day. Low-fat dairy: 2 servings a day. Fish, poultry, or lean meat: 1 serving a day. Beans and legumes: 2 or more servings a week. Nuts and seeds: 1-2 servings a day. Whole grains: 6-8 servings a day. Extra-virgin olive oil: 3-4 servings a day. Limit red meat and sweets to only a few servings a month. Lifestyle  Cook and eat meals together with your family, when possible. Drink enough fluid to keep your urine pale yellow. Be physically active every day. This includes: Aerobic exercise like running or swimming. Leisure activities like gardening, walking, or housework. Get 7-8 hours of sleep each night. If recommended by your health care provider, drink red wine in moderation. This means 1 glass a day for nonpregnant women and 2 glasses a day for men. A glass of wine equals 5 oz (150 mL). What foods should I eat? Fruits Apples. Apricots. Avocado. Berries. Bananas. Cherries. Dates. Figs. Grapes. Lemons. Melon. Oranges. Peaches. Plums. Pomegranate. Vegetables Artichokes. Beets. Broccoli. Cabbage. Carrots. Eggplant. Green beans. Chard. Kale. Spinach. Onions. Leeks. Peas. Squash. Tomatoes. Peppers. Radishes. Grains Whole-grain pasta. Brown rice. Bulgur wheat. Polenta. Couscous. Whole-wheat bread. [place]. Meats and other proteins Beans. Almonds. Sunflower seeds. Pine nuts. Peanuts. [place]. Salmon. Scallops. Shrimp. [place]. Tilapia. Clams. Oysters. Eggs. Poultry without skin. Dairy Low-fat milk. Cheese. Greek yogurt. Fats and oils Extra-virgin olive oil. Avocado oil. Grapeseed oil. Beverages Water. Red wine. Herbal tea. Sweets and desserts Greek yogurt with honey. Baked apples. Poached pears. Trail mix. Seasonings and condiments Basil. Cilantro. Coriander. Cumin. Mint. Parsley. Sage. Rosemary. Tarragon. Garlic. Oregano. Thyme. Pepper. Balsamic vinegar. Tahini. Hummus.  Tomato sauce. Olives. Mushrooms. The items listed above may not be a complete list of foods and beverages you can eat. Contact a dietitian for more information. What foods should I limit? This is a list of foods that should be eaten rarely or only on special occasions. Fruits Fruit canned in syrup. Vegetables Deep-fried potatoes (french fries). Grains Prepackaged pasta or rice dishes. Prepackaged cereal with added sugar. Prepackaged snacks with added sugar. Meats and other proteins Beef. Pork. Lamb. Poultry with skin. Hot dogs. Berniece Salines. Dairy Ice cream. Sour cream. Whole milk. Fats and oils Butter. Canola oil. Vegetable oil. Beef fat (tallow). Lard. Beverages Juice. Sugar-sweetened soft drinks. Beer. Liquor and spirits. Sweets and desserts Cookies. Cakes. Pies. Candy. Seasonings and condiments Mayonnaise. Pre-made sauces and marinades. The items listed above may not be a complete list of foods and beverages you should limit. Contact a dietitian for more information. Summary The Mediterranean diet includes both food and lifestyle choices. Eat a variety of fresh fruits and vegetables, beans, nuts, seeds, and whole grains. Limit the amount of red meat and sweets that you eat. If recommended by your health care provider, drink red wine in  moderation. This means 1 glass a day for nonpregnant women and 2 glasses a day for men. A glass of wine equals 5 oz (150 mL). This information is not intended to replace advice given to you by your health care provider. Make sure you discuss any questions you have with your health care provider. Document Revised: 12/15/2019 Document Reviewed: 10/12/2019 Elsevier Patient Education  [place]

## 2022-06-30 NOTE — Addendum Note (Signed)
Addended by: Gaetano Net on: 06/30/2022 09:52 AM   Modules accepted: Orders

## 2022-07-01 ENCOUNTER — Telehealth: Payer: Self-pay | Admitting: Cardiology

## 2022-07-01 NOTE — Telephone Encounter (Signed)
Spoke with the patient and advised her that there are no instructions for prior to her echocardiogram.  Advised her that instructions given to her at her appointment were for prior to her CT scan scheduled for 8/23. Patient verbalized understanding.

## 2022-07-01 NOTE — Telephone Encounter (Signed)
Pt c/o medication issue:  1. Name of Medication:    2. How are you currently taking this medication (dosage and times per day)?    3. Are you having a reaction (difficulty breathing--STAT)? no  4. What is your medication issue?   Patient calling in to see if she still suppose to take her meds as usually for her echo appt. Please advise

## 2022-07-02 ENCOUNTER — Ambulatory Visit (HOSPITAL_COMMUNITY): Payer: Medicare Other | Attending: Nurse Practitioner

## 2022-07-02 ENCOUNTER — Telehealth: Payer: Self-pay | Admitting: *Deleted

## 2022-07-02 ENCOUNTER — Ambulatory Visit (INDEPENDENT_AMBULATORY_CARE_PROVIDER_SITE_OTHER): Payer: Medicare Other | Admitting: Student

## 2022-07-02 DIAGNOSIS — Z9981 Dependence on supplemental oxygen: Secondary | ICD-10-CM | POA: Insufficient documentation

## 2022-07-02 DIAGNOSIS — I5042 Chronic combined systolic (congestive) and diastolic (congestive) heart failure: Secondary | ICD-10-CM | POA: Diagnosis not present

## 2022-07-02 DIAGNOSIS — J9611 Chronic respiratory failure with hypoxia: Secondary | ICD-10-CM | POA: Diagnosis not present

## 2022-07-02 DIAGNOSIS — K5900 Constipation, unspecified: Secondary | ICD-10-CM | POA: Diagnosis not present

## 2022-07-02 LAB — ECHOCARDIOGRAM COMPLETE
Area-P 1/2: 4.06 cm2
S' Lateral: 4.4 cm

## 2022-07-02 MED ORDER — PSYLLIUM 400 MG PO CAPS: 400.0000 mg | ORAL_CAPSULE | ORAL | 0 refills | Status: AC | PRN

## 2022-07-02 MED ORDER — PERFLUTREN LIPID MICROSPHERE
1.0000 mL | INTRAVENOUS | Status: AC | PRN
  Administered 2022-07-02: 8 mL via INTRAVENOUS

## 2022-07-02 MED ORDER — PERFLUTREN LIPID MICROSPHERE: 1.0000 mL | INTRAVENOUS | Status: AC | PRN

## 2022-07-02 NOTE — Progress Notes (Signed)
[facility] Internal [facility] Residency Telephone Encounter Continuity Care Appointment  HPI:  This telephone encounter was created for [name] on 07/02/2022 for the following purpose/cc constipation.    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  Non-functioning   Aortic atherosclerosis ([place]) 09/24/2017   Asymptomatic, found on CT scan   Blood transfusion without reported diagnosis    Chronic Systolic Heart Failure 5/88/5027   Felt to be non-ischemic and secondary to hypertension.  Echo (05/28/2014): LVEF 25%.  Is not interested in AICD placement.   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  No residual deficits.   History of cerebrovascular accident 05/10/2013   Per patient report 6 previous strokes, most recent on 05/10/13.  No residual deficits.   Hyperlipidemia    Overweight (BMI 25.0-29.9) 09/24/2017   Psoriasis 09/24/2017   Seasonal allergic rhinitis due to pollen 09/24/2017   Spring and early Fall   Small Bowel Obstruction (SBO) 01/13/2014   Ex-Lap & Lysis of Adhesion, 01/15/2014   Thyroid nodule 09/04/2019   Tobacco use disorder 01/15/2014   Type 2 diabetes mellitus with moderate nonproliferative diabetic retinopathy ([place]) 10/15/2013     ROS:  Reports bloating,  constipation, weight gain, fatigue Denies diarrhea, hematochezia, melena, nausea, vomiting   Assessment / Plan / Recommendations:   Constipation  Patient has been dealing with constipation, on and off, for about three months. She also reports bloating and diarrhea. She has had moderate success with senna. She has been using bisacodyl about twice per week and it helps, but she does not want to keep using laxatives and is interested in dietary intervention. Her diet currently consists of grits, bacon, sometimes toast, coffee, bologna sandwiches with ketchup-mustard, pot roast with carrots, water, one soda per day, and orange juice. The lack of vegetables in her diet provides a clear opportunity for dietary adjustments. Acute constipation with concurrent weight gain and fatigue warrants further workup including TSH if dietary modifications prove unsuccessful.  - Recommend consuming more fiber via vegetables such as sweet potato, salad, carrots, broccoli - Prescribe psyllium supplement as an additional option - Return to clinic as scheduled on 9-25 for possible TSH workup   Please see A&P under problem oriented charting for assessment of the patient's acute and chronic medical conditions.  As always, pt is advised that if symptoms worsen or new symptoms arise, they should go to an urgent care facility or to to ER for further evaluation.      Consent and Medical Decision Making:  Patient seen with Dr. Philipp Ovens This is a telephone encounter between Alicia [name] and [name] on 07/02/2022 for 73mn. The visit was conducted with the patient located at home [facility] The patient's identity was confirmed using their DOB and current address. The patient has  consented to being evaluated through a telephone encounter and understands the associated risks (an examination cannot be done and the patient may need to come in for an appointment) / benefits (allows the patient to remain at home,  decreasing exposure to coronavirus). I personally spent 10 minutes on medical discussion.

## 2022-07-02 NOTE — Assessment & Plan Note (Signed)
  Patient has been dealing with constipation, on and off, for about three months. She also reports bloating and diarrhea. She has had moderate success with senna. She has been using bisacodyl about twice per week and it helps, but she does not want to keep using laxatives and is interested in dietary intervention. Her diet currently consists of grits, bacon, sometimes toast, coffee, bologna sandwiches with ketchup-mustard, pot roast with carrots, water, one soda per day, and orange juice. The lack of vegetables in her diet provides a clear opportunity for dietary adjustments. Acute constipation with concurrent weight gain and fatigue warrants further workup including TSH if dietary modifications prove unsuccessful.  - Recommend consuming more fiber via vegetables such as sweet potato, salad, carrots, broccoli - Prescribe psyllium supplement as an additional option - Return to clinic as scheduled on 9-25 for possible TSH workup

## 2022-07-02 NOTE — Telephone Encounter (Signed)
Call from patient has started shaking when her Atorvastatin was increased by Cardiology.  Patient will call that office and discuss.  Unable to sleep good at night. Problems with BM off and on for 2 months.  Took a laxative on Monday.  Had  a small constipated stool.  No nausea.  No fevers.  Abdomen hard and passing gas today.  Ate a lot of vegetables with no great success. Refused appointment for today but will do a Telehealth. .  Scheduled with Dr. Jodi Mourning at 1:45 PM today.

## 2022-07-03 NOTE — Telephone Encounter (Signed)
Pumping function is reduced from previous echo, now at 40-45% down from 55-60% in 2020. There is also some impaired relaxation and some abnormal wall motion.  Scheduled for coronary CT on 8/23 which will help [name] with analysis of wall motion abnormality and whether it is caused by ischemia. Will await CT results but if she has worsening dyspnea or chest pain, would recommend ED as we need to rule out ischemia. Per Christen Bame, NP  Patient called, educated, and shared Echo results per NP Swinyer's note.  Pt understood that her previous pumping function is down compared to 2020 Echo results.  Pt educated on how the heart pumping function is directly connected to her feeling short of breath at times.   Pt understood that the coronary CT scan on 8/23 with help the providers modify / establish adjustments in her care plan.    Pt did not completely understand what the Cardiac CT scan was for, and what she needed to do to prepare for this test.  She thought it was going to be similar to her Heart Cath.  Pt was educated, and the Discharge AVS with Cardiac CT instructions was reviewed / shared; Including what to do with hydration, and which meds to hold was clarified with the patient over the telephone. Pt made aware of the contrast dye, and how to wash her cardiovascular system post test.   Pt understood results, Cardiac CT instructions, and advised to go to the ER this weekend / future with worsening SOB or Chest Pain.  No f/u req.

## 2022-07-07 ENCOUNTER — Encounter (INDEPENDENT_AMBULATORY_CARE_PROVIDER_SITE_OTHER): Payer: Medicare Other | Admitting: Ophthalmology

## 2022-07-07 DIAGNOSIS — I10 Essential (primary) hypertension: Secondary | ICD-10-CM

## 2022-07-07 DIAGNOSIS — H4421 Degenerative myopia, right eye: Secondary | ICD-10-CM

## 2022-07-07 DIAGNOSIS — H34832 Tributary (branch) retinal vein occlusion, left eye, with macular edema: Secondary | ICD-10-CM

## 2022-07-07 DIAGNOSIS — H43813 Vitreous degeneration, bilateral: Secondary | ICD-10-CM

## 2022-07-07 DIAGNOSIS — H35033 Hypertensive retinopathy, bilateral: Secondary | ICD-10-CM

## 2022-07-13 NOTE — Progress Notes (Signed)
Internal Medicine Clinic Attending  I personally confirmed the key portions of the history documented by Dr. Jodi Mourning and I reviewed pertinent patient test results.  The assessment, diagnosis, and plan were formulated together and I agree with the documentation in the resident's note.

## 2022-07-14 ENCOUNTER — Telehealth (HOSPITAL_COMMUNITY): Payer: Self-pay | Admitting: *Deleted

## 2022-07-14 NOTE — Telephone Encounter (Signed)
Reaching out to patient to offer assistance regarding upcoming cardiac imaging study; pt verbalizes understanding of appt date/time, parking situation and where to check in, pre-test NPO status and medications ordered, and verified current allergies; name and call back number provided for further questions should they arise  Gordy Clement RN Navigator Cardiac Imaging [facility] [phone] office [phone] cell  Patient to take '50mg'$  metoprolol tartrate two hours prior to her cardiac CT scan. She is aware to arrive at 9:30am.

## 2022-07-15 ENCOUNTER — Ambulatory Visit (HOSPITAL_COMMUNITY)
Admission: RE | Admit: 2022-07-15 | Discharge: 2022-07-15 | Disposition: A | Discharge status: Home / Self Care | Payer: Medicare Other | Source: Ambulatory Visit | Attending: Nurse Practitioner | Admitting: Nurse Practitioner

## 2022-07-15 DIAGNOSIS — R072 Precordial pain: Secondary | ICD-10-CM | POA: Diagnosis not present

## 2022-07-15 DIAGNOSIS — I5042 Chronic combined systolic (congestive) and diastolic (congestive) heart failure: Secondary | ICD-10-CM | POA: Diagnosis not present

## 2022-07-15 DIAGNOSIS — I2510 Atherosclerotic heart disease of native coronary artery without angina pectoris: Secondary | ICD-10-CM | POA: Diagnosis not present

## 2022-07-15 DIAGNOSIS — I700 Atherosclerosis of aorta: Secondary | ICD-10-CM | POA: Insufficient documentation

## 2022-07-15 DIAGNOSIS — R931 Abnormal findings on diagnostic imaging of heart and coronary circulation: Secondary | ICD-10-CM | POA: Insufficient documentation

## 2022-07-15 MED ORDER — IOHEXOL 350 MG/ML SOLN
100.0000 mL | Freq: Once | INTRAVENOUS | Status: AC | PRN
  Administered 2022-07-15: 100 mL via INTRAVENOUS

## 2022-07-15 MED ORDER — NITROGLYCERIN 0.4 MG SL SUBL
SUBLINGUAL_TABLET | SUBLINGUAL | Status: AC
  Filled 2022-07-15: qty 2

## 2022-07-15 MED ORDER — NITROGLYCERIN 0.4 MG SL SUBL
0.8000 mg | SUBLINGUAL_TABLET | Freq: Once | SUBLINGUAL | Status: AC
Start: 2022-07-15 — End: 2022-07-15
  Administered 2022-07-15: 0.8 mg via SUBLINGUAL

## 2022-07-16 ENCOUNTER — Ambulatory Visit (HOSPITAL_BASED_OUTPATIENT_CLINIC_OR_DEPARTMENT_OTHER)
Admission: RE | Admit: 2022-07-16 | Discharge: 2022-07-16 | Disposition: A | Discharge status: Home / Self Care | Payer: Medicare Other | Source: Ambulatory Visit | Attending: Cardiology | Admitting: Cardiology

## 2022-07-16 ENCOUNTER — Other Ambulatory Visit: Payer: Self-pay | Admitting: Cardiology

## 2022-07-16 ENCOUNTER — Ambulatory Visit (HOSPITAL_COMMUNITY)
Admission: RE | Admit: 2022-07-16 | Discharge: 2022-07-16 | Disposition: A | Discharge status: Home / Self Care | Payer: Medicare Other | Source: Ambulatory Visit | Attending: Cardiology | Admitting: Cardiology

## 2022-07-16 DIAGNOSIS — R931 Abnormal findings on diagnostic imaging of heart and coronary circulation: Secondary | ICD-10-CM

## 2022-07-16 DIAGNOSIS — I2510 Atherosclerotic heart disease of native coronary artery without angina pectoris: Secondary | ICD-10-CM | POA: Diagnosis not present

## 2022-07-16 DIAGNOSIS — I5042 Chronic combined systolic (congestive) and diastolic (congestive) heart failure: Secondary | ICD-10-CM | POA: Diagnosis not present

## 2022-07-16 DIAGNOSIS — I700 Atherosclerosis of aorta: Secondary | ICD-10-CM | POA: Diagnosis not present

## 2022-07-16 DIAGNOSIS — R072 Precordial pain: Secondary | ICD-10-CM | POA: Diagnosis not present

## 2022-07-19 DIAGNOSIS — J9611 Chronic respiratory failure with hypoxia: Secondary | ICD-10-CM | POA: Diagnosis not present

## 2022-07-22 ENCOUNTER — Encounter: Payer: Self-pay | Admitting: Internal Medicine

## 2022-07-22 ENCOUNTER — Ambulatory Visit (INDEPENDENT_AMBULATORY_CARE_PROVIDER_SITE_OTHER): Payer: Medicare Other | Admitting: Internal Medicine

## 2022-07-22 VITALS — BP 120/70 | HR 81 | Temp 98.2°F | Ht 67.0 in | Wt 218.4 lb

## 2022-07-22 DIAGNOSIS — Z122 Encounter for screening for malignant neoplasm of respiratory organs: Secondary | ICD-10-CM | POA: Diagnosis not present

## 2022-07-22 DIAGNOSIS — J441 Chronic obstructive pulmonary disease with (acute) exacerbation: Secondary | ICD-10-CM

## 2022-07-22 MED ORDER — ALBUTEROL SULFATE (2.5 MG/3ML) 0.083% IN NEBU: 2.5000 mg | INHALATION_SOLUTION | Freq: Four times a day (QID) | RESPIRATORY_TRACT | 12 refills | Status: DC | PRN

## 2022-07-22 MED ORDER — STIOLTO RESPIMAT 2.5-2.5 MCG/ACT IN AERS: 2.0000 | INHALATION_SPRAY | Freq: Every day | RESPIRATORY_TRACT | 5 refills | Status: DC

## 2022-07-22 MED ORDER — PREDNISONE 20 MG PO TABS: 40.0000 mg | ORAL_TABLET | Freq: Every day | ORAL | 0 refills | Status: DC

## 2022-07-22 MED ORDER — ALBUTEROL SULFATE HFA 108 (90 BASE) MCG/ACT IN AERS
2.0000 | INHALATION_SPRAY | Freq: Four times a day (QID) | RESPIRATORY_TRACT | 5 refills | Status: DC | PRN
Start: 2022-07-22 — End: 2023-01-12

## 2022-07-22 MED ORDER — ALBUTEROL SULFATE (2.5 MG/3ML) 0.083% IN NEBU
2.5000 mg | INHALATION_SOLUTION | Freq: Once | RESPIRATORY_TRACT | Status: AC
  Administered 2022-07-22: 2.5 mg via RESPIRATORY_TRACT
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
## 2022-07-22 NOTE — Progress Notes (Signed)
12  

## 2022-07-22 NOTE — Progress Notes (Signed)
The patient has been prescribed the inhaler stiolto and albuterol. Inhaler technique was demonstrated to patient. The patient subsequently demonstrated correct technique.

## 2022-07-22 NOTE — Patient Instructions (Addendum)
Please schedule follow up scheduled with myself in 10 weeks.  If my schedule is not open yet, we will contact you with a reminder closer to that time. Please call [phone] if you haven't heard from [name] a month before.   Before your next visit I would like you to have: Full set of PFTs - 1 hour. Follow up with me afterwards  Continue to wear your oxygen and get a pulse -oximeter to measure at home. Your goal is over 88%. We are giving you a breathing treatment in the office today for a flare of your COPD. Start taking prednisone 40 mg once daily for 5 days.  Start taking stiolto inhaler 2 puffs twice a day. Take the albuterol rescue inhaler every 4 to 6 hours as needed for wheezing or shortness of breath. You can also take it 15 minutes before exercise or exertional activity. Side effects include heart racing or pounding, jitters or anxiety. If you have a history of an irregular heart rhythm, it can make this worse. Can also give some patients a hard time sleeping. We can also send a nebulizer machine to your home to take up to 4 times a day as needed.      Understanding COPD   What is COPD? COPD stands for chronic obstructive pulmonary (lung) disease. COPD is a general term used for several lung diseases.  COPD is an umbrella term and encompasses other  common diseases in this group like chronic bronchitis and emphysema. Chronic asthma may also be included in this group. While some patients with COPD have only chronic bronchitis or emphysema, most patients have a combination of both.  You might hear these terms used in exchange for one another.   COPD adds to the work of the heart. Diseased lungs may reduce the amount of oxygen that goes to the blood. High blood pressure in blood vessels from the heart to the lungs makes it difficult for the heart to pump. Lung disease can also cause the body to produce too many red blood cells which may make the blood thicker and harder to pump.    Patients who have COPD with low oxygen levels may develop an enlarged heart (cor pulmonale). This condition weakens the heart and causes increased shortness of breath and swelling in the legs and feet.   Chronic bronchitis Chronic bronchitis is irritation and inflammation (swelling) of the lining in the bronchial tubes (air passages). The irritation causes coughing and an excess amount of mucus in the airways. The swelling makes it difficult to get air in and out of the lungs. The small, hair-like structures on the inside of the airways (called cilia) may be damaged by the irritation. The cilia are then unable to help clean mucus from the airways.  Bronchitis is generally considered to be chronic when you have: a productive cough (cough up mucus) and shortness of breath that lasts about 3 months or more each year for 2 or more years in a row. Your doctor may define chronic bronchitis differently.   Emphysema Emphysema is the destruction, or breakdown, of the walls of the alveoli (air sacs) located at the end of the bronchial tubes. The damaged alveoli are not able to exchange oxygen and carbon dioxide between the lungs and the blood. The bronchioles lose their elasticity and collapse when you exhale, trapping air in the lungs. The trapped air keeps fresh air and oxygen from entering the lungs.   Who is affected by COPD? Emphysema and  chronic bronchitis affect approximately 16 million people in the [place], or close to 11 percent of the population.   Symptoms of COPD  Shortness of breath  Shortness of breath with mild exercise (walking, using the stairs, etc.)  Chronic, productive cough (with mucus)  A feeling of "tightness" in the chest  Wheezing   What causes COPD? The two primary causes of COPD are cigarette smoking and alpha1-antitrypsin (AAT) deficiency. Air pollution and occupational dusts may also contribute to COPD, especially when the person exposed to these substances is a  cigarette smoker.  Cigarette smoke causes COPD by irritating the airways and creating inflammation that narrows the airways, making it more difficult to breathe. Cigarette smoke also causes the cilia to stop working properly so mucus and trapped particles are not cleaned from the airways. As a result, chronic cough and excess mucus production develop, leading to chronic bronchitis.  In some people, chronic bronchitis and infections can lead to destruction of the small airways, or emphysema.  AAT deficiency, an inherited disorder, can also lead to emphysema. Alpha antitrypsin (AAT) is a protective material produced in the liver and transported to the lungs to help combat inflammation. When there is not enough of the chemical AAT, the body is no longer protected from an enzyme in the white blood cells.   How is COPD diagnosed?  To diagnose COPD, the physician needs to know: Do you smoke?  Have you had chronic exposure to dust or air pollutants?  Do other members of your family have lung disease?  Are you short of breath?  Do you get short of breath with exercise?  Do you have chronic cough and/or wheezing?  Do you cough up excess mucus?  To help with the diagnosis, the physician will conduct a thorough physical exam which includes:  Listening to your lungs and heart  Checking your blood pressure and pulse  Examining your nose and throat  Checking your feet and ankles for swelling   Laboratory and other tests Several laboratory and other tests are needed to confirm a diagnosis of COPD. These tests may include:  Chest X-ray to look for lung changes that could be caused by COPD   Spirometry and pulmonary function tests (PFTs) to determine lung volume and air flow  Pulse oximetry to measure the saturation of oxygen in the blood  Arterial blood gases (ABGs) to determine the amount of oxygen and carbon dioxide in the blood  Exercise testing to determine if the oxygen level in the blood drops during  exercise   Treatment In the beginning stages of COPD, there is minimal shortness of breath that may be noticed only during exercise. As the disease progresses, shortness of breath may worsen and you may need to wear an oxygen device.   To help control other symptoms of COPD, the following treatments and lifestyle changes may be prescribed.  Quitting smoking  Avoiding cigarette smoke and other irritants  Taking medications including: a. bronchodilators b. anti-inflammatory agents c. oxygen d. antibiotics  Maintaining a healthy diet  Following a structured exercise program such as pulmonary rehabilitation Preventing respiratory infections  Controlling stress   If your COPD progresses, you may be eligible to be evaluated for lung volume reduction surgery or lung transplantation. You may also be eligible to participate in certain clinical trials (research studies). Ask your health care providers about studies being conducted in your hospital.   What is the outlook? Although COPD can not be cured, its symptoms can be  treated and your quality of life can be improved. Your prognosis or outlook for the future will depend on how well your lungs are functioning, your symptoms, and how well you respond to and follow your treatment plan.

## 2022-07-24 DIAGNOSIS — J441 Chronic obstructive pulmonary disease with (acute) exacerbation: Secondary | ICD-10-CM | POA: Diagnosis not present

## 2022-08-04 ENCOUNTER — Encounter (INDEPENDENT_AMBULATORY_CARE_PROVIDER_SITE_OTHER): Payer: Medicare Other | Admitting: Ophthalmology

## 2022-08-08 ENCOUNTER — Other Ambulatory Visit: Payer: Self-pay | Admitting: Student in an Organized Health Care Education/Training Program

## 2022-08-08 DIAGNOSIS — E113393 Type 2 diabetes mellitus with moderate nonproliferative diabetic retinopathy without macular edema, bilateral: Secondary | ICD-10-CM

## 2022-08-11 ENCOUNTER — Encounter (INDEPENDENT_AMBULATORY_CARE_PROVIDER_SITE_OTHER): Payer: Medicare Other | Admitting: Ophthalmology

## 2022-08-12 ENCOUNTER — Ambulatory Visit (HOSPITAL_COMMUNITY)
Admission: RE | Admit: 2022-08-12 | Discharge: 2022-08-12 | Disposition: A | Discharge status: Home / Self Care | Payer: Medicare Other | Source: Ambulatory Visit | Attending: Internal Medicine | Admitting: Internal Medicine

## 2022-08-12 DIAGNOSIS — Z122 Encounter for screening for malignant neoplasm of respiratory organs: Secondary | ICD-10-CM | POA: Diagnosis not present

## 2022-08-12 DIAGNOSIS — J439 Emphysema, unspecified: Secondary | ICD-10-CM | POA: Diagnosis not present

## 2022-08-12 DIAGNOSIS — I700 Atherosclerosis of aorta: Secondary | ICD-10-CM | POA: Insufficient documentation

## 2022-08-12 DIAGNOSIS — I2510 Atherosclerotic heart disease of native coronary artery without angina pectoris: Secondary | ICD-10-CM | POA: Insufficient documentation

## 2022-08-12 DIAGNOSIS — Z87891 Personal history of nicotine dependence: Secondary | ICD-10-CM | POA: Insufficient documentation

## 2022-08-13 ENCOUNTER — Encounter (INDEPENDENT_AMBULATORY_CARE_PROVIDER_SITE_OTHER): Payer: Medicare Other | Admitting: Ophthalmology

## 2022-08-13 DIAGNOSIS — H35033 Hypertensive retinopathy, bilateral: Secondary | ICD-10-CM | POA: Diagnosis not present

## 2022-08-13 DIAGNOSIS — H4421 Degenerative myopia, right eye: Secondary | ICD-10-CM | POA: Diagnosis not present

## 2022-08-13 DIAGNOSIS — H43813 Vitreous degeneration, bilateral: Secondary | ICD-10-CM | POA: Diagnosis not present

## 2022-08-13 DIAGNOSIS — H34832 Tributary (branch) retinal vein occlusion, left eye, with macular edema: Secondary | ICD-10-CM | POA: Diagnosis not present

## 2022-08-13 DIAGNOSIS — I10 Essential (primary) hypertension: Secondary | ICD-10-CM | POA: Diagnosis not present

## 2022-08-16 ENCOUNTER — Other Ambulatory Visit: Payer: Self-pay | Admitting: Student in an Organized Health Care Education/Training Program

## 2022-08-17 ENCOUNTER — Ambulatory Visit (INDEPENDENT_AMBULATORY_CARE_PROVIDER_SITE_OTHER): Payer: Medicare Other | Admitting: Student in an Organized Health Care Education/Training Program

## 2022-08-17 ENCOUNTER — Encounter: Payer: Self-pay | Admitting: Student in an Organized Health Care Education/Training Program

## 2022-08-17 VITALS — BP 145/70 | HR 73 | Temp 98.7°F | Ht 67.0 in | Wt 221.3 lb

## 2022-08-17 DIAGNOSIS — Z23 Encounter for immunization: Secondary | ICD-10-CM

## 2022-08-17 DIAGNOSIS — Z87891 Personal history of nicotine dependence: Secondary | ICD-10-CM

## 2022-08-17 DIAGNOSIS — Z0000 Encounter for general adult medical examination without abnormal findings: Secondary | ICD-10-CM

## 2022-08-17 DIAGNOSIS — B3731 Acute candidiasis of vulva and vagina: Secondary | ICD-10-CM | POA: Diagnosis not present

## 2022-08-17 DIAGNOSIS — B372 Candidiasis of skin and nail: Secondary | ICD-10-CM | POA: Diagnosis not present

## 2022-08-17 DIAGNOSIS — I10 Essential (primary) hypertension: Secondary | ICD-10-CM

## 2022-08-17 DIAGNOSIS — E113393 Type 2 diabetes mellitus with moderate nonproliferative diabetic retinopathy without macular edema, bilateral: Secondary | ICD-10-CM

## 2022-08-17 DIAGNOSIS — J9611 Chronic respiratory failure with hypoxia: Secondary | ICD-10-CM | POA: Diagnosis not present

## 2022-08-17 DIAGNOSIS — Z9981 Dependence on supplemental oxygen: Secondary | ICD-10-CM

## 2022-08-17 LAB — GLUCOSE, CAPILLARY: Glucose-Capillary: 102 mg/dL — ABNORMAL HIGH (ref 70–99)

## 2022-08-17 LAB — POCT GLYCOSYLATED HEMOGLOBIN (HGB A1C): Hemoglobin A1C: 7.7 % — AB (ref 4.0–5.6)

## 2022-08-17 MED ORDER — NYSTATIN 100000 UNIT/GM EX POWD: 1.0000 | Freq: Three times a day (TID) | CUTANEOUS | 2 refills | Status: DC

## 2022-08-17 MED ORDER — FLUCONAZOLE 150 MG PO TABS: 150.0000 mg | ORAL_TABLET | Freq: Every day | ORAL | 0 refills | Status: DC

## 2022-08-17 NOTE — Progress Notes (Signed)
Subjective:   Patient ID: Alicia [name] female   DOB: Jul 29, 1956 66 y.o.   MRN: [id number]  HPI: Alicia [name] is a 66 y.o. with pmhx significant for BMI of 34, chronic respiratory failure who presents for an acute concern of yeast infection and routine follow-up. She reports that the skin under her breasts are itchy bilaterally and malodorous, and has not tried any treatments yet. She also reports vaginal itching and discomfort, denies dysuria.   Patient reports she remains on supplemental oxygen at home and O2 sats are typically around 91% at home when at rest. She saw pulmonology about one month ago for bronchitis and was prescribed prednisone x5 days, Stiolto inhaler, and albuterol. Patient reports she finished both inhalers and could not refill before the end of the month. Patient endorses ongoing productive cough with white sputum, denies hemoptysis, fevers, chills or bowel movement changes. She denies any recent falls.    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 09/24/2017   Seasonal allergic rhinitis due to pollen 09/24/2017   Healthcare maintenance 09/24/2017   Obesity (BMI 30.0-34.9) 09/24/2017   Hyperlipidemia    Uterine prolapse 02/12/2016   Coronary artery disease involving native coronary artery of native heart without angina pectoris 11/09/2014   Chronic combined systolic and diastolic heart failure ([place]) 05/10/2014   Type 2 diabetes mellitus with moderate nonproliferative diabetic  retinopathy ([place]) 10/15/2013   Essential hypertension 10/15/2013     Current Outpatient Medications  Medication Sig Dispense Refill   fluconazole (DIFLUCAN) 150 MG tablet Take 1 tablet (150 mg total) by mouth daily. 1 tablet 0   nystatin powder Apply 1 Application topically 3 (three) times daily. 15 g 2   albuterol (PROVENTIL) (2.5 MG/3ML) 0.083% nebulizer solution Take 3 mLs (2.5 mg total) by nebulization every 6 (six) hours as needed for wheezing or shortness of breath. 75 mL 12   albuterol (VENTOLIN HFA) 108 (90 Base) MCG/ACT inhaler Inhale 2 puffs into the lungs every 6 (six) hours as needed. 18 g 5   amLODipine (NORVASC) 10 MG tablet TAKE 1 TABLET BY MOUTH EVERY DAY 90 tablet 3   aspirin 81 MG tablet Take 81 mg by mouth daily.     atorvastatin (LIPITOR) 80 MG tablet Take 1 tablet (80 mg total) by mouth daily. 90 tablet 3   B-D UF III MINI PEN NEEDLES 31G X 5 MM MISC USE 3 TIMES A DAY WITH HUMALOG 100 each 5   ENTRESTO 97-103 MG TAKE 1 TABLET BY MOUTH TWICE A DAY 180 tablet 3   FARXIGA 5 MG TABS tablet TAKE 1 TABLET BY MOUTH EVERY DAY BEFORE BREAKFAST 90 tablet 3   furosemide (LASIX) 40 MG tablet Take 2 tablets (80 mg total) by mouth daily. 180 tablet 3   glucose blood (ACCU-CHEK AVIVA PLUS) test strip USE TO TEST BLOOD SUGARS AS DIRECTED 100 strip 12   hydrochlorothiazide (HYDRODIURIL) 25 MG tablet Take 1 tablet (25 mg total) by mouth daily. 90 tablet 3   insulin glargine (LANTUS SOLOSTAR) 100 UNIT/ML  Solostar Pen Inject 40 Units into the skin at bedtime. 45 mL 3   insulin lispro (HUMALOG KWIKPEN) 100 UNIT/ML KwikPen INJECT 15 UNITS WITH BREAKFAST AND 20 UNITS WITH DINNER (Patient taking differently: INJECT 14 UNITS WITH BREAKFAST AND 20 UNITS WITH DINNER) 9 mL 11   Insulin Pen Needle 32G X 4 MM MISC Use to inject insulin 4 times a day. The patient is insulin requiring, ICD 10 code 11.10. The patient injects 4 times per day. 400 each 3   Insulin Syringe-Needle U-100 31G X 15/64" 0.3 ML  MISC Use to inject Humalog before meals three times a day 100 each 5   Lancets (ACCU-CHEK MULTICLIX) lancets Use to check your blood sugar four times daily: early morning, before a meal, two hours after a meal, and bedtime 100 each 12   metoprolol tartrate (LOPRESSOR) 100 MG tablet TAKE 1 TABLET BY MOUTH 2 HOURS PRIOR TO THE CARDIAC CT 1 tablet 0   PATADAY 0.1 % ophthalmic solution Place 1 drop into both eyes 2 (two) times daily. 5 mL 1   senna (SENOKOT) 8.6 MG TABS tablet Take 2 tablets (17.2 mg total) by mouth daily. 120 tablet 2   Tiotropium Bromide-Olodaterol (STIOLTO RESPIMAT) 2.5-2.5 MCG/ACT AERS Inhale 2 puffs into the lungs daily. 1 each 5   triamcinolone (KENALOG) 0.025 % cream Apply 1 application topically 2 (two) times daily. 30 g 2   TRULICITY 1.5 XB/9.3JQ SOPN INJECT 1.5 MG INTO THE SKIN ONCE A WEEK. 2 mL 5   No current facility-administered medications for this visit.     Review of Systems: Pertinent items are noted in HPI.  Objective:   Physical Exam: Vitals:   08/17/22 0901  BP: (!) 145/70  Pulse: 73  Temp: 98.7 F (37.1 C)  TempSrc: Oral  SpO2: 94%  Weight: 221 lb 4.8 oz (100.4 kg)  Height: '5\' 7"'$  (1.702 m)   General: Alert, in no acute distress.  CV: RRR, no murmurs. Pulm: Intermittent coughing throughout visit. No wheezing.  Abd: Soft, non-tender, non-distended.  Extremities: Large areas of moist, hyperpigmented skin under mammary folds bilaterally.    Assessment & Plan:   Candidal intertrigo Patient's presentation and exam is consistent with candidal intertrigo. Recommend applying nystatin powder to affected areas.  Plan: -Apply nystatin powder 3x daily  Vaginal yeast infection Patient reports acute onset of vaginal burning/itching with no dysuria, most likely vaginal yeast infection.  Plan: -Fluconazole '150mg'$  x1 tablet  Chronic respiratory failure with hypoxia, on home oxygen therapy [facility]) Patient recently had an acute episode of bronchitis one  month ago and patient is continuing to experience ongoing productive cough. Denies fevers, chills, or malaise. She finished a course of prednisone x5 days and Stiolto and albuterol inhalers over the last four weeks. She has had no change in oxygen requirement. We discussed that bronchitis can take 6 weeks to resolve and recommended she refill her inhalers when able. No further workup today.   Type 2 diabetes mellitus with moderate nonproliferative diabetic retinopathy ([place]) Patient has chronic T2DM that is improving. HbA1c today is down to 7.7%. Patient did not bring glucometer with her today, reports that blood sugars were high during recent course of prednisone. She denies any lows. Plan to continue current regimen.  Plan: -Continue Lantus 40 at bedtime -Continue Humalog 14U w/ breakfast and 20U w/ dinner -Continue Trulicity 1.'5mg'$  weekly -Continue Farxiga '5mg'$  daily  Healthcare maintenance Patient received flu shot today. LDCT done last week was benign.

## 2022-08-17 NOTE — Assessment & Plan Note (Signed)
Patient reports acute onset of vaginal burning/itching with no dysuria, most likely vaginal yeast infection.  Plan: -Fluconazole '150mg'$  x1 tablet

## 2022-08-17 NOTE — Assessment & Plan Note (Signed)
Patient recently had an acute episode of bronchitis one month ago and patient is continuing to experience ongoing productive cough. Denies fevers, chills, or malaise. She finished a course of prednisone x5 days and Stiolto and albuterol inhalers over the last four weeks. She has had no change in oxygen requirement. We discussed that bronchitis can take 6 weeks to resolve and recommended she refill her inhalers when able. No further workup today.

## 2022-08-17 NOTE — Patient Instructions (Signed)
Your diabetes is well controlled. I did prescribe an antifungal powder to use on your skin under your breasts once a day. For your vaginal yeast infection I prescribed an antifungal pill to take once. You have refills available for your inhaler medicine, please call your pharmacy for those refills and let us know if there are any issues.

## 2022-08-17 NOTE — Assessment & Plan Note (Signed)
Patient has chronic T2DM that is improving. HbA1c today is down to 7.7%. Patient did not bring glucometer with her today, reports that blood sugars were high during recent course of prednisone. She denies any lows. Plan to continue current regimen.  Plan: -Continue Lantus 40 at bedtime -Continue Humalog 14U w/ breakfast and 20U w/ dinner -Continue Trulicity 1.'5mg'$  weekly -Continue Farxiga '5mg'$  daily

## 2022-08-17 NOTE — Progress Notes (Signed)
Attestation for Student Documentation:  I personally was present and performed or re-performed the history, physical exam and medical decision-making activities of this service and have verified that the service and findings are accurately documented in the student's note.  66 year old person here for follow up of hypertension and diabetes. Doing fairly well at home, independent. Had recent follow up with cardiology and pulmonology because of chronic heart and lung disease, on supplemental oxygen at home. Diabetes is under acceptable control. I encouraged her to refill the stiolto inhaler, there is some confusion with her pharmacy about what medicines are available for refill. She did not bring her bottles to clinic today, so hard to clarify. Will continue with current medications, we are also treating a mild acute fungal intertrigo rash and fungal yeast infection likely related to diabetes, obesity, and recent prednisone use.  Axel Filler, MD 08/17/2022, 4:40 PM

## 2022-08-17 NOTE — Assessment & Plan Note (Signed)
Patient received flu shot today. LDCT done last week was benign.

## 2022-08-17 NOTE — Assessment & Plan Note (Signed)
Patient's presentation and exam is consistent with candidal intertrigo. Recommend applying nystatin powder to affected areas.  Plan: -Apply nystatin powder 3x daily

## 2022-08-19 DIAGNOSIS — J9611 Chronic respiratory failure with hypoxia: Secondary | ICD-10-CM | POA: Diagnosis not present

## 2022-09-07 ENCOUNTER — Ambulatory Visit: Payer: Medicare Other | Attending: Nurse Practitioner

## 2022-09-07 DIAGNOSIS — I5042 Chronic combined systolic (congestive) and diastolic (congestive) heart failure: Secondary | ICD-10-CM

## 2022-09-07 DIAGNOSIS — R072 Precordial pain: Secondary | ICD-10-CM

## 2022-09-07 DIAGNOSIS — I700 Atherosclerosis of aorta: Secondary | ICD-10-CM

## 2022-09-08 LAB — LIPID PANEL
Chol/HDL Ratio: 3.4 ratio (ref 0.0–4.4)
Cholesterol, Total: 188 mg/dL (ref 100–199)
HDL: 56 mg/dL (ref >39)
LDL Chol Calc (NIH): 108 mg/dL — ABNORMAL HIGH (ref 0–99)
Triglycerides: 139 mg/dL (ref 0–149)
VLDL Cholesterol Cal: 24 mg/dL (ref 5–40)

## 2022-09-08 LAB — ALT: ALT: 8 IU/L (ref 0–32)

## 2022-09-09 ENCOUNTER — Telehealth: Payer: Self-pay

## 2022-09-09 DIAGNOSIS — I700 Atherosclerosis of aorta: Secondary | ICD-10-CM

## 2022-09-09 MED ORDER — EZETIMIBE 10 MG PO TABS: 10.0000 mg | ORAL_TABLET | Freq: Every day | ORAL | 3 refills | Status: DC

## 2022-09-09 NOTE — Telephone Encounter (Signed)
-----   Message from Loel Dubonnet, NP sent at 09/08/2022  5:04 PM EDT ----- Normal liver enzyme. LDL (bad cholesterol) 108 not at goal of less than 70. Recommend start Zetia '10mg'$  daily with repeat FLP/LFT in 3 months.

## 2022-09-09 NOTE — Telephone Encounter (Signed)
The patient has been notified of the result and verbalized understanding.  All questions (if any) were answered. Antonieta Iba, RN 09/09/2022 8:53 AM  Zetia has been sent in. Repeat labs have been scheduled.

## 2022-09-10 ENCOUNTER — Encounter (INDEPENDENT_AMBULATORY_CARE_PROVIDER_SITE_OTHER): Payer: Medicare Other | Admitting: Ophthalmology

## 2022-09-10 DIAGNOSIS — H4421 Degenerative myopia, right eye: Secondary | ICD-10-CM | POA: Diagnosis not present

## 2022-09-10 DIAGNOSIS — H34832 Tributary (branch) retinal vein occlusion, left eye, with macular edema: Secondary | ICD-10-CM

## 2022-09-10 DIAGNOSIS — H35033 Hypertensive retinopathy, bilateral: Secondary | ICD-10-CM

## 2022-09-10 DIAGNOSIS — I10 Essential (primary) hypertension: Secondary | ICD-10-CM

## 2022-09-10 DIAGNOSIS — H43813 Vitreous degeneration, bilateral: Secondary | ICD-10-CM | POA: Diagnosis not present

## 2022-09-17 ENCOUNTER — Ambulatory Visit (INDEPENDENT_AMBULATORY_CARE_PROVIDER_SITE_OTHER): Payer: Medicare Other | Admitting: Student

## 2022-09-17 DIAGNOSIS — Z9981 Dependence on supplemental oxygen: Secondary | ICD-10-CM

## 2022-09-17 DIAGNOSIS — J9611 Chronic respiratory failure with hypoxia: Secondary | ICD-10-CM

## 2022-09-17 DIAGNOSIS — I5042 Chronic combined systolic (congestive) and diastolic (congestive) heart failure: Secondary | ICD-10-CM

## 2022-09-17 DIAGNOSIS — Z87891 Personal history of nicotine dependence: Secondary | ICD-10-CM | POA: Diagnosis not present

## 2022-09-17 NOTE — Assessment & Plan Note (Signed)
Patient is on chronic home oxygen therapy.  Patient reports that she has been having decreased oxygen saturations.  She states that she does not use her oxygen while mobile.  She reports that she is compliant with her inhalers.  I instruct patient to use oxygen with mobility as well.  Plan: -Continue albuterol as well as Stiolto 2 puffs daily -Instructed patient to use oxygen while mobile as well as at rest -Follow-up scheduled for 09/21/2022 with Dr. Allyson Sabal

## 2022-09-17 NOTE — Progress Notes (Signed)
CC:   This is a telephone encounter between [name] and [name] on 09/17/2022 for concerns of lightheadedness. The visit was conducted with the patient located at home and [name] at [facility]. The patient's identity was confirmed using their DOB and current address. The patient has consented to being evaluated through a telephone encounter and understands the associated risks (an examination cannot be done and the patient may need to come in for an appointment) / benefits (allows the patient to remain at home, decreasing exposure to coronavirus). I personally spent 25 minutes on medical discussion.   HPI:  [name] is a 66 y.o. with PMH as below.   Please see A&P for assessment of the patient's acute and chronic medical conditions.   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  Non-functioning   Aortic atherosclerosis ([place]) 09/24/2017   Asymptomatic, found on CT scan   Blood transfusion without reported diagnosis    Chronic Systolic Heart Failure 1/93/7902   Felt to be non-ischemic and secondary to hypertension.  Echo (05/28/2014): LVEF 25%.  Is not interested in AICD placement.   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  No residual deficits.   History of cerebrovascular accident 05/10/2013    Per patient report 6 previous strokes, most recent on 05/10/13.  No residual deficits.   Hyperlipidemia    Overweight (BMI 25.0-29.9) 09/24/2017   Psoriasis 09/24/2017   Seasonal allergic rhinitis due to pollen 09/24/2017   Spring and early Fall   Small Bowel Obstruction (SBO) 01/13/2014   Ex-Lap & Lysis of Adhesion, 01/15/2014   Thyroid nodule 09/04/2019   Tobacco use disorder 01/15/2014   Type 2 diabetes mellitus with moderate nonproliferative diabetic retinopathy ([place]) 10/15/2013   Review of Systems:    Patient endorses lightheadedness, and feet swelling.  Assessment & Plan:   Chronic combined systolic and diastolic heart failure ([place]) Patient's most recent echo showing ejection fraction of 40 to 45% on August 2023.  Patient has been on Entresto as well as daily Lasix.  Patient has followed by the cardiology clinic.  Patient called for telephone encounter today on 09/17/2022 for concerns of lightheadedness.  Patient reports that this initially started as bilateral lower extremity swelling, which got better as she went from 40 mg Lasix to 80 mg Lasix.  Since, she has developed lightheadedness.  She reports that this lightheadedness gets better when she lays down and worse when she stands up fast.  She states that she only drinks about 1 bottle of water a day.  She no longer has concerns of lower extremity edema.  Patient likely has volume depleted herself.  Patient has not checked her weight nor blood pressure at home as she does not have the means to do so.  I think the best fix to this, would be  to decrease Lasix, and encourage increased fluid intake for the next few days.  Patient should also be seen in clinic for further evaluation.  Plan: -Skip 1 day of Lasix entirely, and then resume Lasix with 60 mg daily instead of 80 -Check daily weights -Follow-up appointment scheduled with Dr. Allyson Sabal 09/21/2022.  -Continue Entresto 200 mg twice daily  Chronic respiratory failure with hypoxia,  on home oxygen therapy [facility]) Patient is on chronic home oxygen therapy.  Patient reports that she has been having decreased oxygen saturations.  She states that she does not use her oxygen while mobile.  She reports that she is compliant with her inhalers.  I instruct patient to use oxygen with mobility as well.  Plan: -Continue albuterol as well as Stiolto 2 puffs daily -Instructed patient to use oxygen while mobile as well as at rest -Follow-up scheduled for 09/21/2022 with Dr. Allyson Sabal    Patient discussed with Dr. Wray Kearns, DO  Internal Medicine Resident

## 2022-09-17 NOTE — Assessment & Plan Note (Signed)
Patient's most recent echo showing ejection fraction of 40 to 45% on August 2023.  Patient has been on Entresto as well as daily Lasix.  Patient has followed by the cardiology clinic.  Patient called for telephone encounter today on 09/17/2022 for concerns of lightheadedness.  Patient reports that this initially started as bilateral lower extremity swelling, which got better as she went from 40 mg Lasix to 80 mg Lasix.  Since, she has developed lightheadedness.  She reports that this lightheadedness gets better when she lays down and worse when she stands up fast.  She states that she only drinks about 1 bottle of water a day.  She no longer has concerns of lower extremity edema.  Patient likely has volume depleted herself.  Patient has not checked her weight nor blood pressure at home as she does not have the means to do so.  I think the best fix to this, would be to decrease Lasix, and encourage increased fluid intake for the next few days.  Patient should also be seen in clinic for further evaluation.  Plan: -Skip 1 day of Lasix entirely, and then resume Lasix with 60 mg daily instead of 80 -Check daily weights -Follow-up appointment scheduled with Dr. Allyson Sabal 09/21/2022.  -Continue Entresto 200 mg twice daily

## 2022-09-18 DIAGNOSIS — J9611 Chronic respiratory failure with hypoxia: Secondary | ICD-10-CM | POA: Diagnosis not present

## 2022-09-21 ENCOUNTER — Ambulatory Visit (INDEPENDENT_AMBULATORY_CARE_PROVIDER_SITE_OTHER): Payer: Medicare Other

## 2022-09-21 ENCOUNTER — Ambulatory Visit (INDEPENDENT_AMBULATORY_CARE_PROVIDER_SITE_OTHER): Payer: Medicare Other | Admitting: Student

## 2022-09-21 ENCOUNTER — Encounter: Payer: Self-pay | Admitting: Student

## 2022-09-21 VITALS — BP 137/79 | HR 61 | Temp 98.1°F | Ht 67.0 in | Wt 222.8 lb

## 2022-09-21 VITALS — BP 153/70 | HR 69 | Temp 98.1°F | Ht 67.0 in | Wt 222.8 lb

## 2022-09-21 DIAGNOSIS — Z0000 Encounter for general adult medical examination without abnormal findings: Secondary | ICD-10-CM

## 2022-09-21 DIAGNOSIS — I2510 Atherosclerotic heart disease of native coronary artery without angina pectoris: Secondary | ICD-10-CM

## 2022-09-21 DIAGNOSIS — I5042 Chronic combined systolic (congestive) and diastolic (congestive) heart failure: Secondary | ICD-10-CM | POA: Diagnosis not present

## 2022-09-21 DIAGNOSIS — Z87891 Personal history of nicotine dependence: Secondary | ICD-10-CM

## 2022-09-21 DIAGNOSIS — I110 Hypertensive heart disease with heart failure: Secondary | ICD-10-CM

## 2022-09-21 DIAGNOSIS — I10 Essential (primary) hypertension: Secondary | ICD-10-CM

## 2022-09-21 NOTE — Progress Notes (Signed)
CC: Follow-up on lightheadedness  HPI:  [name] is a 66 y.o. with past medical history of HFrEF, COPD on home oxygen, hypertension, hyperlipidemia, type 2 diabetes, who presents to the clinic to follow-up on her lightheadedness.  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  Non-functioning   Aortic atherosclerosis ([place]) 09/24/2017   Asymptomatic, found on CT scan   Blood transfusion without reported diagnosis    Chronic Systolic Heart Failure 3/66/2947   Felt to be non-ischemic and secondary to hypertension.  Echo (05/28/2014): LVEF 25%.  Is not interested in AICD placement.   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  No residual deficits.   History of cerebrovascular accident 05/10/2013   Per patient report 6 previous strokes, most recent on 05/10/13.  No residual deficits.   Hyperlipidemia    Overweight (BMI 25.0-29.9) 09/24/2017   Psoriasis 09/24/2017   Seasonal allergic rhinitis due to pollen 09/24/2017   Spring and early Fall   Small Bowel Obstruction (SBO) 01/13/2014   Ex-Lap & Lysis of Adhesion, 01/15/2014   Thyroid nodule 09/04/2019   Tobacco use disorder 01/15/2014   Type 2 diabetes mellitus with moderate nonproliferative diabetic retinopathy  ([place]) 10/15/2013   Review of Systems:  per HPI  Physical Exam:  Vitals:   09/21/22 0955 09/21/22 1000 09/21/22 1004  BP: (!) 144/56 137/79 (!) 153/70  Pulse: (!) 58 61 69  Temp: 98.1 F (36.7 C)    TempSrc: Oral    SpO2: 94%    Weight: 222 lb 12.8 oz (101.1 kg)    Height: '5\' 7"'$  (1.702 m)     Physical Exam Constitutional:      General: She is not in acute distress.    Appearance: She is not ill-appearing.  HENT:     Head: Normocephalic.  Eyes:     General:        Right eye: No discharge.        Left eye: No discharge.     Conjunctiva/sclera: Conjunctivae normal.  Cardiovascular:     Rate and Rhythm: Normal rate and regular rhythm.     Comments: +2 LE edema up to bilateral knees.  No obvious JVD seen Pulmonary:     Effort: Pulmonary effort is normal.     Comments: Mild crackles heard at bilateral lung bases.  No wheezing. Musculoskeletal:     Cervical back: Normal range of motion.  Skin:    General: Skin is warm.  Neurological:     Mental Status: She is alert.  Psychiatric:        Mood and Affect: Mood normal.      Assessment & Plan:   See Encounters Tab for problem based charting.  Chronic combined systolic and diastolic heart failure ([place]) Patient is here to follow-up for  her lightheadedness, likely orthostatic symptoms.  She had a telehealth visit with Dr. Posey Pronto a few days ago and was instructed to decrease the Lasix from 80 to 60 mg daily and increase fluid intake.  Today patient states that she is doing the same even though orthostatic vital sign checked in the clinic was negative.  Patient did not experience any symptoms with postural change in the clinic.    She reports less urine output with Lasix 60 mg.  She states that her LE edema and shortness of breath have worsened.  Denies any change in her orthopnea which she used 2 pillows to sleep.  She denies any wheezing.  Report adherence to her maintenance inhaler.  Denies any more frequent use of her rescue  inhaler.  Physical exam showed +2 edema of her lower extremity up to knees.  Mild crackles heard at bilateral lung bases.  Could not appreciate JVD.  Her weight is stable at 221 lbs.  Echocardiogram in August showed reduction in EF 40-45% with regional wall abnormality.  CT coronary did not show any significant stenosis and suggest diffuse CAD.  Overall, I think patient may have a mild heart failure exacerbation given the decreased dose of her Lasix and possibly worsening of her EF.  Currently, her orthostatic hypotension seems to have resolved.  -Advised patient to take Lasix 80 mg twice daily for the next 3 days then return to 80 mg daily -We will discontinue HCTZ to avoid hypotension and hypokalemia with increased Lasix -Continue GDMT with Lisabeth Register, Trulicity.  No beta-blocker in the setting of low normal heart rate and severe COPD per cardiology -Follow-up in 1 week for BMP  Coronary artery disease involving native coronary artery of native heart without angina pectoris -Continue aspirin 81 mg daily -Continue atorvastatin 80 mg and Zetia 10 mg daily.  Zetia was added recently due to above goal LDL of 108  Essential hypertension -Continue amlodipine and Entresto -Stopped HCTZ to avoid orthostatic hypotension and hypokalemia in the setting of Lasix use.   Patient discussed with Dr. Dareen Piano

## 2022-09-21 NOTE — Patient Instructions (Signed)
[name],  It was a pleasure seeing you in the clinic today.  Here is a summary what we talked about:  1.  I am glad that your lightheadedness symptoms have improved in the clinic this morning but it seems that you are retaining more fluid.  Please take Lasix 80 mg (2 tablets) twice a day for the next 3 days then return to 80 mg once a day.  Please STOP your hydrochlorothiazide.    Please return in 1 week for [name] to recheck your blood pressure and kidney function.  Take care,  Dr. Alfonse Spruce

## 2022-09-21 NOTE — Progress Notes (Signed)
Internal Medicine Clinic Attending  I personally confirmed the key portions of the history and documentation by Dr. Alfonse Spruce and I reviewed pertinent patient test results.  The assessment, diagnosis, and plan were formulated together and I agree with the documentation in the resident's note.

## 2022-09-21 NOTE — Assessment & Plan Note (Signed)
-  Continue aspirin 81 mg daily -Continue atorvastatin 80 mg and Zetia 10 mg daily.  Zetia was added recently due to above goal LDL of 108

## 2022-09-21 NOTE — Assessment & Plan Note (Addendum)
Patient is here to follow-up for her lightheadedness, likely orthostatic symptoms.  She had a telehealth visit with Dr. Posey Pronto a few days ago and was instructed to decrease the Lasix from 80 to 60 mg daily and increase fluid intake.  Today patient states that she is doing the same even though orthostatic vital sign checked in the clinic was negative.  Patient did not experience any symptoms with postural change in the clinic.    She reports less urine output with Lasix 60 mg.  She states that her LE edema and shortness of breath have worsened.  Denies any change in her orthopnea which she used 2 pillows to sleep.  She denies any wheezing.  Report adherence to her maintenance inhaler.  Denies any more frequent use of her rescue inhaler.  Physical exam showed +2 edema of her lower extremity up to knees.  Mild crackles heard at bilateral lung bases.  Could not appreciate JVD.  Her weight is stable at 221 lbs.  Echocardiogram in August showed reduction in EF 40-45% with regional wall abnormality.  CT coronary did not show any significant stenosis and suggest diffuse CAD.  Overall, I think patient may have a mild heart failure exacerbation given the decreased dose of her Lasix and possibly worsening of her EF.  Currently, her orthostatic hypotension seems to have resolved.  -Advised patient to take Lasix 80 mg twice daily for the next 3 days then return to 80 mg daily -We will discontinue HCTZ to avoid hypotension and hypokalemia with increased Lasix -Continue GDMT with Lisabeth Register, Trulicity.  No beta-blocker in the setting of low normal heart rate and severe COPD per cardiology -Follow-up in 1 week for BMP

## 2022-09-21 NOTE — Addendum Note (Signed)
Addended by: Jodean Lima on: 09/21/2022 11:26 AM   Modules accepted: Level of Service
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
## 2022-09-21 NOTE — Assessment & Plan Note (Signed)
-  Continue amlodipine and Entresto -Stopped HCTZ to avoid orthostatic hypotension and hypokalemia in the setting of Lasix use.

## 2022-09-22 NOTE — Progress Notes (Addendum)
Internal Medicine Clinic Attending  Case discussed with Dr. Nguyen  At the time of the visit.  We reviewed the resident's history and exam and pertinent patient test results.  I agree with the assessment, diagnosis, and plan of care documented in the resident's note. 

## 2022-09-28 ENCOUNTER — Other Ambulatory Visit: Payer: Self-pay

## 2022-09-28 ENCOUNTER — Ambulatory Visit (INDEPENDENT_AMBULATORY_CARE_PROVIDER_SITE_OTHER): Payer: Medicare Other | Admitting: Student

## 2022-09-28 ENCOUNTER — Encounter: Payer: Self-pay | Admitting: Student

## 2022-09-28 VITALS — BP 140/74 | HR 67 | Temp 97.7°F | Ht 67.0 in | Wt 226.0 lb

## 2022-09-28 DIAGNOSIS — J9611 Chronic respiratory failure with hypoxia: Secondary | ICD-10-CM | POA: Diagnosis not present

## 2022-09-28 DIAGNOSIS — I5042 Chronic combined systolic (congestive) and diastolic (congestive) heart failure: Secondary | ICD-10-CM | POA: Diagnosis not present

## 2022-09-28 DIAGNOSIS — Z9981 Dependence on supplemental oxygen: Secondary | ICD-10-CM

## 2022-09-28 DIAGNOSIS — Z87891 Personal history of nicotine dependence: Secondary | ICD-10-CM | POA: Diagnosis not present

## 2022-09-28 NOTE — Assessment & Plan Note (Addendum)
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 She was seen last week for a mild HF exacerbation. At that time, she was advised to take lasix '80mg'$  BID for 3 days prior to restarting her usual home dose of '80mg'$  daily. She states that she did not realize that she was to take it twice daily and thus has been taking lasix once daily. She reports feeling much better now than how she felt last week. Does not feel as short of breath. Despite improvement in symptoms, she continues to have bilateral lower extremity swelling, although no JVD or crackles on my exam. She is saturating well on her home O2 of 4L [place]. Her weight has increased from 222 lbs to 226 lbs over this past week. Her current HF exacerbation is likely due to not taking her lasix at BID dosing as opposed to outpatient lasix failure. We discussed taking lasix '80mg'$  BID for 4 days prior to switching back to home dose and she confirms understanding. She has remained adherent with her farxiga, entresto, and trulicity. Will have her follow up with [name] in 1 week to reassess volume status. She does report that she has plenty of lasix at home.  Plan: -lasix '80mg'$  BID for 4 days, then return back to '80mg'$  daily -continue GDMT with entresto, farxiga, trulicity -avoid beta-blocker due to severe COPD (per cardiology) -f/u in 1 week to reassess volume status

## 2022-09-28 NOTE — Progress Notes (Signed)
CC: mild exacerbation of combined systolic and diastolic HF  HPI:  [name] is a 66 y.o. female with history listed below presenting to the [facility] for mild exacerbation of combined systolic and diastolic HF. Please see individualized problem based charting for full HPI.  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  Non-functioning   Aortic atherosclerosis ([place]) 09/24/2017   Asymptomatic, found on CT scan   Blood transfusion without reported diagnosis    Chronic Systolic Heart Failure 7/82/4235   Felt to be non-ischemic and secondary to hypertension.  Echo (05/28/2014): LVEF 25%.  Is not interested in AICD placement.   COPD exacerbation ([place])    Coronary artery disease involving native coronary artery of native heart without angina pectoris 11/09/2014   Cardiac cath (02/18/2014): Non-obstructive, mRCA 30%, dRCA 60%   Cystocele with uterine prolapse - grade 3 02/12/2016   Not interested in pessary after trying   Diverticulosis of colon 09/24/2017   Diverticulosis of colon 09/24/2017   Essential hypertension 10/15/2013   Gastroesophageal reflux disease 09/24/2017   History of cerebrovascular accident 05/10/2013   Per patient report 6 previous strokes, most recent on 05/10/13.  No residual deficits.   History of cerebrovascular accident 05/10/2013   Per patient report 6 previous strokes, most recent on 05/10/13.  No residual deficits.   Hyperlipidemia    Overweight (BMI 25.0-29.9) 09/24/2017   Psoriasis 09/24/2017   Seasonal allergic rhinitis due to pollen 09/24/2017   Spring and early Fall   Small Bowel Obstruction (SBO) 01/13/2014   Ex-Lap & Lysis of Adhesion, 01/15/2014   Thyroid nodule 09/04/2019   Tobacco use disorder 01/15/2014   Type 2 diabetes mellitus with moderate nonproliferative diabetic retinopathy ([place])  10/15/2013    Review of Systems:  Negative aside from that listed in individualized problem based charting.  Physical Exam:  Vitals:   09/28/22 0907  BP: (!) 140/74  Pulse: 67  Temp: 97.7 F (36.5 C)  TempSrc: Oral  SpO2: 100%  Weight: 226 lb (102.5 kg)  Height: '5\' 7"'$  (1.702 m)   Physical Exam Constitutional:      Appearance: She is obese. She is not ill-appearing.  HENT:     Mouth/Throat:     Mouth: Mucous membranes are moist.     Pharynx: Oropharynx is clear.  Eyes:     Extraocular Movements: Extraocular movements intact.     Pupils: Pupils are equal, round, and reactive to light.  Cardiovascular:     Rate and Rhythm: Normal rate and regular rhythm.     Pulses: Normal pulses.     Comments: No JVD noted on exam Pulmonary:     Effort: Pulmonary effort is normal.     Breath sounds: No wheezing, rhonchi or rales.     Comments: Saturating well on home 4L O2 via [place]. No crackles or other adventitious sounds noted on exam.  Abdominal:     General: Bowel sounds are normal. There is no distension.     Palpations: Abdomen is soft.     Tenderness: There is no abdominal tenderness.  Musculoskeletal:        General: Normal range of motion.     Comments: 1-2+ pitting edema in bilateral LE up to knees.   Skin:    General: Skin is dry.     Comments:  Extremities are cool, but this is chronic per patient.  Neurological:     General: No focal deficit present.     Mental Status: She is alert and oriented to person, place, and time.  Psychiatric:        Mood and Affect: Mood normal.        Behavior: Behavior normal.      Assessment & Plan:   See Encounters Tab for problem based charting.  Patient discussed with Dr. Heber Poydras

## 2022-09-28 NOTE — Patient Instructions (Addendum)
Ms. Verge,  It was a pleasure seeing you in the clinic today.   Please make sure to take your water pill (furosemide) as follows: Please take 2 tablets ('80mg'$ ) twice a day (2 tablets in the morning and 2 tablets in the afternoon) for the next 4 days. On [place], you can go back to your usual dose and only take 2 tablets once a day. Please make sure to go see your lung doctor on Wednesday. Come back to see [name] in 1 week to make sure your swelling has improved.  Please call our clinic at [phone] if you have any questions or concerns. The best time to call is Monday-[place] from 9am-4pm, but there is someone available 24/7 at the same number. If you need medication refills, please notify your pharmacy one week in advance and they will send [name] a request.   Thank you for letting us take part in your care. We look forward to seeing you next time!

## 2022-09-28 NOTE — Assessment & Plan Note (Signed)
PFTs in 2019 indicative of a restrictive process, possibly OHS given habitus. She is on 4L supplemental O2 at home, both at rest and with mobility. She is scheduled to see  Pulm on Wednesday, which I encouraged her to keep.

## 2022-09-29 ENCOUNTER — Other Ambulatory Visit: Payer: Self-pay

## 2022-09-29 DIAGNOSIS — J441 Chronic obstructive pulmonary disease with (acute) exacerbation: Secondary | ICD-10-CM

## 2022-09-30 ENCOUNTER — Ambulatory Visit: Payer: Medicare Other | Admitting: Internal Medicine

## 2022-10-02 NOTE — Progress Notes (Signed)
Internal Medicine Clinic Attending  Case discussed with the resident at the time of the visit.  We reviewed the resident's history and exam and pertinent patient test results.  I agree with the assessment, diagnosis, and plan of care documented in the resident's note.  

## 2022-10-05 ENCOUNTER — Ambulatory Visit: Payer: Medicare Other

## 2022-10-05 ENCOUNTER — Encounter: Payer: Medicare Other | Admitting: Student

## 2022-10-06 NOTE — Progress Notes (Signed)
I reviewed the AWV findings with the provider who conducted the visit. I was present in the office suite and immediately available to provide assistance and direction throughout the time the service was provided.  

## 2022-10-08 ENCOUNTER — Encounter (INDEPENDENT_AMBULATORY_CARE_PROVIDER_SITE_OTHER): Payer: Medicare Other | Admitting: Ophthalmology

## 2022-10-11 ENCOUNTER — Inpatient Hospital Stay (HOSPITAL_COMMUNITY)
Admission: EM | Admit: 2022-10-11 | Discharge: 2022-10-13 | DRG: 291 | Disposition: A | Discharge status: Home / Self Care | Payer: Medicare Other | Attending: Infectious Diseases | Admitting: Infectious Diseases

## 2022-10-11 ENCOUNTER — Emergency Department (HOSPITAL_COMMUNITY): Payer: Medicare Other

## 2022-10-11 ENCOUNTER — Other Ambulatory Visit: Payer: Self-pay

## 2022-10-11 DIAGNOSIS — I700 Atherosclerosis of aorta: Secondary | ICD-10-CM | POA: Diagnosis present

## 2022-10-11 DIAGNOSIS — E113399 Type 2 diabetes mellitus with moderate nonproliferative diabetic retinopathy without macular edema, unspecified eye: Secondary | ICD-10-CM | POA: Diagnosis not present

## 2022-10-11 DIAGNOSIS — L409 Psoriasis, unspecified: Secondary | ICD-10-CM | POA: Diagnosis not present

## 2022-10-11 DIAGNOSIS — Z1152 Encounter for screening for COVID-19: Secondary | ICD-10-CM

## 2022-10-11 DIAGNOSIS — J9621 Acute and chronic respiratory failure with hypoxia: Secondary | ICD-10-CM | POA: Diagnosis present

## 2022-10-11 DIAGNOSIS — Z7982 Long term (current) use of aspirin: Secondary | ICD-10-CM | POA: Diagnosis not present

## 2022-10-11 DIAGNOSIS — E739 Lactose intolerance, unspecified: Secondary | ICD-10-CM | POA: Diagnosis present

## 2022-10-11 DIAGNOSIS — Z7984 Long term (current) use of oral hypoglycemic drugs: Secondary | ICD-10-CM

## 2022-10-11 DIAGNOSIS — Z9981 Dependence on supplemental oxygen: Secondary | ICD-10-CM

## 2022-10-11 DIAGNOSIS — I5021 Acute systolic (congestive) heart failure: Secondary | ICD-10-CM | POA: Diagnosis not present

## 2022-10-11 DIAGNOSIS — Z888 Allergy status to other drugs, medicaments and biological substances status: Secondary | ICD-10-CM

## 2022-10-11 DIAGNOSIS — R0902 Hypoxemia: Secondary | ICD-10-CM | POA: Diagnosis not present

## 2022-10-11 DIAGNOSIS — J449 Chronic obstructive pulmonary disease, unspecified: Secondary | ICD-10-CM | POA: Diagnosis present

## 2022-10-11 DIAGNOSIS — J9622 Acute and chronic respiratory failure with hypercapnia: Secondary | ICD-10-CM | POA: Diagnosis not present

## 2022-10-11 DIAGNOSIS — Z794 Long term (current) use of insulin: Secondary | ICD-10-CM | POA: Diagnosis not present

## 2022-10-11 DIAGNOSIS — Z87891 Personal history of nicotine dependence: Secondary | ICD-10-CM

## 2022-10-11 DIAGNOSIS — Z79899 Other long term (current) drug therapy: Secondary | ICD-10-CM

## 2022-10-11 DIAGNOSIS — I5043 Acute on chronic combined systolic (congestive) and diastolic (congestive) heart failure: Secondary | ICD-10-CM | POA: Diagnosis present

## 2022-10-11 DIAGNOSIS — Z91148 Patient's other noncompliance with medication regimen for other reason: Secondary | ICD-10-CM | POA: Diagnosis not present

## 2022-10-11 DIAGNOSIS — J441 Chronic obstructive pulmonary disease with (acute) exacerbation: Principal | ICD-10-CM

## 2022-10-11 DIAGNOSIS — R0602 Shortness of breath: Secondary | ICD-10-CM | POA: Diagnosis not present

## 2022-10-11 DIAGNOSIS — K5900 Constipation, unspecified: Secondary | ICD-10-CM | POA: Diagnosis present

## 2022-10-11 DIAGNOSIS — E1169 Type 2 diabetes mellitus with other specified complication: Secondary | ICD-10-CM | POA: Diagnosis present

## 2022-10-11 DIAGNOSIS — Z833 Family history of diabetes mellitus: Secondary | ICD-10-CM

## 2022-10-11 DIAGNOSIS — I509 Heart failure, unspecified: Secondary | ICD-10-CM | POA: Diagnosis not present

## 2022-10-11 DIAGNOSIS — I2510 Atherosclerotic heart disease of native coronary artery without angina pectoris: Secondary | ICD-10-CM | POA: Diagnosis not present

## 2022-10-11 DIAGNOSIS — D3502 Benign neoplasm of left adrenal gland: Secondary | ICD-10-CM | POA: Diagnosis not present

## 2022-10-11 DIAGNOSIS — K219 Gastro-esophageal reflux disease without esophagitis: Secondary | ICD-10-CM | POA: Diagnosis not present

## 2022-10-11 DIAGNOSIS — Z8673 Personal history of transient ischemic attack (TIA), and cerebral infarction without residual deficits: Secondary | ICD-10-CM | POA: Diagnosis not present

## 2022-10-11 DIAGNOSIS — E8729 Other acidosis: Secondary | ICD-10-CM | POA: Diagnosis present

## 2022-10-11 DIAGNOSIS — I110 Hypertensive heart disease with heart failure: Principal | ICD-10-CM | POA: Diagnosis present

## 2022-10-11 DIAGNOSIS — E785 Hyperlipidemia, unspecified: Secondary | ICD-10-CM | POA: Diagnosis not present

## 2022-10-11 DIAGNOSIS — J439 Emphysema, unspecified: Secondary | ICD-10-CM | POA: Diagnosis not present

## 2022-10-11 LAB — BASIC METABOLIC PANEL
Anion gap: 9 (ref 5–15)
BUN: 14 mg/dL (ref 8–23)
CO2: 35 mmol/L — ABNORMAL HIGH (ref 22–32)
Calcium: 7.9 mg/dL — ABNORMAL LOW (ref 8.9–10.3)
Chloride: 94 mmol/L — ABNORMAL LOW (ref 98–111)
Creatinine, Ser: 0.9 mg/dL (ref 0.44–1.00)
GFR, Estimated: >60 mL/min (ref >60)
Glucose, Bld: 115 mg/dL — ABNORMAL HIGH (ref 70–99)
Potassium: 3.8 mmol/L (ref 3.5–5.1)
Sodium: 138 mmol/L (ref 135–145)

## 2022-10-11 LAB — I-STAT ARTERIAL BLOOD GAS, ED
Acid-Base Excess: 9 mmol/L — ABNORMAL HIGH (ref 0.0–2.0)
Bicarbonate: 37.2 mmol/L — ABNORMAL HIGH (ref 20.0–28.0)
Calcium, Ion: 1.13 mmol/L — ABNORMAL LOW (ref 1.15–1.40)
HCT: 32 % — ABNORMAL LOW (ref 36.0–46.0)
Hemoglobin: 10.9 g/dL — ABNORMAL LOW (ref 12.0–15.0)
O2 Saturation: 99 %
Patient temperature: 99.1
Potassium: 3.2 mmol/L — ABNORMAL LOW (ref 3.5–5.1)
Sodium: 139 mmol/L (ref 135–145)
TCO2: 39 mmol/L — ABNORMAL HIGH (ref 22–32)
pCO2 arterial: 71.7 mmHg (ref 32–48)
pH, Arterial: 7.324 — ABNORMAL LOW (ref 7.35–7.45)
pO2, Arterial: 171 mmHg — ABNORMAL HIGH (ref 83–108)

## 2022-10-11 LAB — RETICULOCYTES
Immature Retic Fract: 22.7 % — ABNORMAL HIGH (ref 2.3–15.9)
RBC.: 3.97 MIL/uL (ref 3.87–5.11)
Retic Count, Absolute: 104.8 10*3/uL (ref 19.0–186.0)
Retic Ct Pct: 2.6 % (ref 0.4–3.1)

## 2022-10-11 LAB — CBC
HCT: 32.5 % — ABNORMAL LOW (ref 36.0–46.0)
Hemoglobin: 9.9 g/dL — ABNORMAL LOW (ref 12.0–15.0)
MCH: 26 pg (ref 26.0–34.0)
MCHC: 30.5 g/dL (ref 30.0–36.0)
MCV: 85.3 fL (ref 80.0–100.0)
Platelets: 260 10*3/uL (ref 150–400)
RBC: 3.81 MIL/uL — ABNORMAL LOW (ref 3.87–5.11)
RDW: 17.2 % — ABNORMAL HIGH (ref 11.5–15.5)
WBC: 7.1 10*3/uL (ref 4.0–10.5)
nRBC: 0 % (ref 0.0–0.2)

## 2022-10-11 LAB — IRON AND TIBC
Iron: 36 ug/dL (ref 28–170)
Saturation Ratios: 13 % (ref 10.4–31.8)
TIBC: 273 ug/dL (ref 250–450)
UIBC: 237 ug/dL

## 2022-10-11 LAB — BRAIN NATRIURETIC PEPTIDE: B Natriuretic Peptide: 403.8 pg/mL — ABNORMAL HIGH (ref 0.0–100.0)

## 2022-10-11 LAB — TROPONIN I (HIGH SENSITIVITY)
Troponin I (High Sensitivity): 19 ng/L — ABNORMAL HIGH (ref <18)
Troponin I (High Sensitivity): 17 ng/L (ref <18)

## 2022-10-11 LAB — GLUCOSE, CAPILLARY: Glucose-Capillary: 218 mg/dL — ABNORMAL HIGH (ref 70–99)

## 2022-10-11 LAB — RESP PANEL BY RT-PCR (FLU A&B, COVID) ARPGX2
Influenza A by PCR: NEGATIVE
Influenza B by PCR: NEGATIVE
SARS Coronavirus 2 by RT PCR: NEGATIVE

## 2022-10-11 LAB — FERRITIN: Ferritin: 96 ng/mL (ref 11–307)

## 2022-10-11 LAB — CBG MONITORING, ED
Glucose-Capillary: 213 mg/dL — ABNORMAL HIGH (ref 70–99)
Glucose-Capillary: 267 mg/dL — ABNORMAL HIGH (ref 70–99)

## 2022-10-11 LAB — HIV ANTIBODY (ROUTINE TESTING W REFLEX): HIV Screen 4th Generation wRfx: NONREACTIVE

## 2022-10-11 MED ORDER — FUROSEMIDE 10 MG/ML IJ SOLN
80.0000 mg | Freq: Two times a day (BID) | INTRAMUSCULAR | Status: DC
  Administered 2022-10-11 – 2022-10-12 (×2): 80 mg via INTRAVENOUS
  Filled 2022-10-11 (×2): qty 8

## 2022-10-11 MED ORDER — ACETAMINOPHEN 325 MG PO TABS: 650.0000 mg | ORAL_TABLET | Freq: Four times a day (QID) | ORAL | Status: DC | PRN

## 2022-10-11 MED ORDER — PREDNISONE 20 MG PO TABS
40.0000 mg | ORAL_TABLET | Freq: Every day | ORAL | Status: DC
  Administered 2022-10-12 – 2022-10-13 (×2): 40 mg via ORAL
  Filled 2022-10-11 (×2): qty 2

## 2022-10-11 MED ORDER — INSULIN ASPART 100 UNIT/ML IJ SOLN
5.0000 [IU] | Freq: Three times a day (TID) | INTRAMUSCULAR | Status: DC
  Administered 2022-10-11 – 2022-10-12 (×5): 5 [IU] via SUBCUTANEOUS

## 2022-10-11 MED ORDER — SENNA 8.6 MG PO TABS
2.0000 | ORAL_TABLET | Freq: Every day | ORAL | Status: DC
  Administered 2022-10-11: 17.2 mg via ORAL
  Filled 2022-10-11 (×3): qty 2

## 2022-10-11 MED ORDER — ENOXAPARIN SODIUM 40 MG/0.4ML IJ SOSY
40.0000 mg | PREFILLED_SYRINGE | Freq: Every day | INTRAMUSCULAR | Status: DC
  Administered 2022-10-11 – 2022-10-12 (×2): 40 mg via SUBCUTANEOUS
  Filled 2022-10-11 (×2): qty 0.4

## 2022-10-11 MED ORDER — ALBUTEROL SULFATE (2.5 MG/3ML) 0.083% IN NEBU
10.0000 mg/h | INHALATION_SOLUTION | Freq: Once | RESPIRATORY_TRACT | Status: AC
  Administered 2022-10-11: 10 mg/h via RESPIRATORY_TRACT
  Filled 2022-10-11: qty 3

## 2022-10-11 MED ORDER — INSULIN ASPART 100 UNIT/ML IJ SOLN
0.0000 [IU] | Freq: Three times a day (TID) | INTRAMUSCULAR | Status: DC
  Administered 2022-10-11: 8 [IU] via SUBCUTANEOUS
  Administered 2022-10-11: 5 [IU] via SUBCUTANEOUS
  Administered 2022-10-12: 3 [IU] via SUBCUTANEOUS
  Administered 2022-10-12: 5 [IU] via SUBCUTANEOUS
  Administered 2022-10-12: 2 [IU] via SUBCUTANEOUS
  Administered 2022-10-13: 8 [IU] via SUBCUTANEOUS
  Administered 2022-10-13 (×2): 3 [IU] via SUBCUTANEOUS

## 2022-10-11 MED ORDER — ATORVASTATIN CALCIUM 80 MG PO TABS
80.0000 mg | ORAL_TABLET | Freq: Every day | ORAL | Status: DC
  Administered 2022-10-11 – 2022-10-13 (×3): 80 mg via ORAL
  Filled 2022-10-11: qty 1
  Filled 2022-10-11: qty 2
  Filled 2022-10-11: qty 1

## 2022-10-11 MED ORDER — MOMETASONE FURO-FORMOTEROL FUM 100-5 MCG/ACT IN AERO
2.0000 | INHALATION_SPRAY | Freq: Two times a day (BID) | RESPIRATORY_TRACT | Status: DC
  Administered 2022-10-12 – 2022-10-13 (×4): 2 via RESPIRATORY_TRACT
  Filled 2022-10-11: qty 8.8

## 2022-10-11 MED ORDER — METHYLPREDNISOLONE SODIUM SUCC 125 MG IJ SOLR
80.0000 mg | INTRAMUSCULAR | Status: AC
  Administered 2022-10-11: 80 mg via INTRAVENOUS
  Filled 2022-10-11: qty 2

## 2022-10-11 MED ORDER — UMECLIDINIUM BROMIDE 62.5 MCG/ACT IN AEPB
1.0000 | INHALATION_SPRAY | Freq: Every day | RESPIRATORY_TRACT | Status: DC
  Administered 2022-10-12 – 2022-10-13 (×2): 1 via RESPIRATORY_TRACT
  Filled 2022-10-11: qty 7

## 2022-10-11 MED ORDER — EZETIMIBE 10 MG PO TABS
10.0000 mg | ORAL_TABLET | Freq: Every day | ORAL | Status: DC
  Administered 2022-10-11 – 2022-10-13 (×3): 10 mg via ORAL
  Filled 2022-10-11 (×3): qty 1

## 2022-10-11 MED ORDER — SACUBITRIL-VALSARTAN 97-103 MG PO TABS
1.0000 | ORAL_TABLET | Freq: Two times a day (BID) | ORAL | Status: DC
  Administered 2022-10-11 – 2022-10-13 (×5): 1 via ORAL
  Filled 2022-10-11 (×7): qty 1

## 2022-10-11 MED ORDER — ASPIRIN 81 MG PO TBEC
81.0000 mg | DELAYED_RELEASE_TABLET | Freq: Every day | ORAL | Status: DC
  Administered 2022-10-11 – 2022-10-13 (×3): 81 mg via ORAL
  Filled 2022-10-11 (×3): qty 1

## 2022-10-11 MED ORDER — AMLODIPINE BESYLATE 10 MG PO TABS
10.0000 mg | ORAL_TABLET | Freq: Every day | ORAL | Status: DC
  Administered 2022-10-11 – 2022-10-13 (×3): 10 mg via ORAL
  Filled 2022-10-11: qty 1
  Filled 2022-10-11: qty 2
  Filled 2022-10-11: qty 1

## 2022-10-11 MED ORDER — INSULIN ASPART 100 UNIT/ML IJ SOLN: 10.0000 [IU] | Freq: Three times a day (TID) | INTRAMUSCULAR | Status: DC

## 2022-10-11 MED ORDER — INSULIN GLARGINE-YFGN 100 UNIT/ML ~~LOC~~ SOLN
40.0000 [IU] | Freq: Every day | SUBCUTANEOUS | Status: DC
  Administered 2022-10-11 – 2022-10-12 (×2): 40 [IU] via SUBCUTANEOUS
  Filled 2022-10-11 (×4): qty 0.4

## 2022-10-11 MED ORDER — DAPAGLIFLOZIN PROPANEDIOL 5 MG PO TABS
5.0000 mg | ORAL_TABLET | Freq: Every day | ORAL | Status: DC
  Administered 2022-10-11 – 2022-10-13 (×3): 5 mg via ORAL
  Filled 2022-10-11 (×3): qty 1

## 2022-10-11 MED ORDER — POTASSIUM CHLORIDE CRYS ER 20 MEQ PO TBCR
20.0000 meq | EXTENDED_RELEASE_TABLET | Freq: Two times a day (BID) | ORAL | Status: AC
  Administered 2022-10-11: 20 meq via ORAL
  Filled 2022-10-11: qty 1

## 2022-10-11 MED ORDER — FUROSEMIDE 10 MG/ML IJ SOLN
80.0000 mg | Freq: Once | INTRAMUSCULAR | Status: AC
  Administered 2022-10-11: 80 mg via INTRAVENOUS
  Filled 2022-10-11: qty 8

## 2022-10-11 MED ORDER — ACETAMINOPHEN 650 MG RE SUPP: 650.0000 mg | Freq: Four times a day (QID) | RECTAL | Status: DC | PRN

## 2022-10-11 NOTE — H&P (Signed)
Date: 10/11/2022               Patient Name:  Alicia [name] MRN: [id number]  DOB: Dec 20, 1955 Age / Sex: 66 y.o., female   PCP: Axel Filler, MD         Medical Service: Internal Medicine Teaching Service         Attending Physician: Dr. Charise Killian, MD    First Contact: Dr. Maurine Simmering Pager: [phone]  Second Contact: Dr. 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 Last night when to the bathroom and felt very dyspneic walking back to bed.Rested in bed without improvement. Noted squeezing and throbbing in the chest lasting about 1 hour. Also endorsed dry cough and right sided pain with inspiration. She had daughter bring her to the ED due to symptoms not improving.   She was in her usual state of health until about 2 weeks ago when she had 2 days of subjective fevers that resolved spontaneously. Then started notinc increased dyspnea for the past week. Patient reports she has been having difficulty with her breathing for several years. Chronically on 4L [place] at home, reports started on nebulized albuterol by her lung doctor. Has not started new stiolto inhaler. Is using albuertol twice daily. Has also noticed more LE edema and abdominal bloating. Has been trying to eat healthier and incorporate more vegetables. Has been taking Lasix 80 mg twice daily although appears is ordered as once daily. Feels she is urinating a little less frequently than before. No recent falls or trauma. She denies fever, chills, palpitation, nausea, vomiting, diarrhea, abdominal pain, melena, syncope or urinary  symptoms.   Meds:  Albuterol PRN Amlodipine 10 mg daily ASA 81 ng daily Atorvastatin 80 mg daily Entresto 97-103 mg twice daily Ezetimibe 10 mg daily Farxiga 5 mg daily Lasix 80 mg daily twice daily (is taking twice daily?) Lantus 40 units at bedtime  Humalog 15 units and 20 units with breakfast and dinner Tiotropium bromide-olodaterol 2 putffs daily (never started) Trulicity 1.'5mg'$  weekly  Allergies: Allergies as of 10/11/2022 - Review Complete 10/11/2022  Allergen Reaction Noted   Beta adrenergic blockers Other (See Comments) 10/24/2018   Canagliflozin Itching, Anxiety, and Palpitations 08/12/2018   Past Medical History:  Diagnosis Date   Abscess of skin of abdomen 08/31/2018   Acquired lactose intolerance 09/24/2017   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  Non-functioning   Aortic atherosclerosis ([place]) 09/24/2017   Asymptomatic, found on CT scan   Blood transfusion without reported diagnosis    Chronic Systolic Heart Failure 9/56/3875   Felt to be non-ischemic and secondary to hypertension.  Echo (05/28/2014): LVEF 25%.  Is not interested in AICD placement.   COPD exacerbation ([place])    Coronary artery disease involving native coronary artery of native heart without angina pectoris 11/09/2014   Cardiac cath (02/18/2014): Non-obstructive, mRCA 30%, dRCA 60%   Cystocele with uterine prolapse - grade 3 02/12/2016   Not interested in pessary after trying   Diverticulosis  of colon 09/24/2017   Diverticulosis of colon 09/24/2017   Essential hypertension 10/15/2013   Gastroesophageal reflux disease 09/24/2017   History of cerebrovascular accident 05/10/2013   Per patient report 6 previous strokes, most recent on 05/10/13.  No residual deficits.   History of cerebrovascular accident 05/10/2013   Per patient report 6 previous strokes, most recent on 05/10/13.  No residual deficits.   Hyperlipidemia    Overweight (BMI 25.0-29.9) 09/24/2017    Psoriasis 09/24/2017   Seasonal allergic rhinitis due to pollen 09/24/2017   Spring and early Fall   Small Bowel Obstruction (SBO) 01/13/2014   Ex-Lap & Lysis of Adhesion, 01/15/2014   Thyroid nodule 09/04/2019   Tobacco use disorder 01/15/2014   Type 2 diabetes mellitus with moderate nonproliferative diabetic retinopathy ([place]) 10/15/2013    Family History: DM(mother and brothers), Twin brother also with  breathing difficulties  Social History: Lives with daughter and 2 grandchildren. Ambulates occasionally with a cane.  Is on disability after prior CVA.  Non-smoker.  Endorses 40-pack-year history.  Denies any alcohol  PCP Dr. Lalla Brothers   Review of Systems: A complete ROS was negative except as per HPI.   Physical Exam: Blood pressure (!) 154/56, pulse 76, temperature 98.2 F (36.8 C), temperature source Oral, resp. rate 13, SpO2 93 %. Constitutional: Chronically ill appearing HENT: Normocephalic and atraumatic, EOMI, conjunctiva normal, moist mucous membranes Cardiovascular: Normal rate, regular rhythm, no murmur. LE edema bilaterally to sacrum. Difficult to access JVD due to habitus Respiratory: Increased work of breathing, Saturating in low to mid 90s on 5L [place]. Few expiratory wheezes, bibasilar crackles  GI: Nondistended, soft, nontender to palpation, normal active bowel sounds Musculoskeletal: Normal bulk and tone.   Neurological: Is alert and oriented x4, no apparent focal deficits noted. Skin: Warm and dry.  No rash, erythema, lesions noted. Psychiatric: Normal mood and affect.   EKG: personally reviewed my interpretation is NSR HR 65 no acute ischemic changes  CXR: personally reviewed my interpretation is with cardiomegaly, peirhilar haziness, blunting of costophrenic angles  Assessment & Plan by Problem: Principal Problem:   Acute exacerbation of CHF (congestive heart failure) (HCC)  Acute on chronic heart failure exacerbation Dyspnea on exertion and lower  extremity edema for 1 week worse last night.  Increased oxygen requirement from her home 4 L nasal cannula.  BNP elevated to 403.  Chest x-ray with vascular congestion.  Has been compliant with home Lasix seems like she is taking this twice a day rather than once a day. CP on admission resolved. Troponin 19>17. No acute ischemic changes or arrhythmias on EKG. No diet indiscretions per patient. Making good urine output with IV lasix. -IV Lasix 80 mg twice daily -Continue home Entresto 97 100 mg twice daily, Farxiga 5 mg daily.  Not on beta-blocker due to severe COPD. -Echocardiogram -Monitor electrolytes, I's and O's, weights -Monitor telemetry  COPD exacerbation Increase cough and dyspnea.  Hypoxic to the 70s requiring further no improvement with nebulizers and IV steroids to 6 L nasal cannula.  Continues to have some mild expiratory wheezing.  Chronically on 4 L at home.  VBG with respiratory acidosis and hypercarbia.  Former smoker.  PFTs in 2019 restrictive disease.  Emphysematous changes on low-dose lung CT. sees Dr. Shearon Stalls pulmonology had [place] added, but she has not tried this. -DuoNebs every 4 -Start LABA LAMA and ICS -Prednisone 40 mg daily - O2 as needed - Will need outpatient PFTs  CAD HLD History of nonobstructive CAD. Chest pain  on admission has since resolved. Troponin 19>17. EKG in NSR. Recent workup for due to elevated Cs score with coronary CT and FFR with diffuse microvascular disease but no significant obstruction. Suspect CP in setting of COPD and CHF.  -Continue ASA 81 mg, atorvastatin 80 mg and Zetia 10 mg daily  T2DM A1c 7.7% on 08/17/2022. Home medications include faxiga, trulicity 1.5 mg weekly, lantus 40 units nightly, and novolg 15 unit with breakfast and 20 units with dinner.  -Semglee 40 units daily - aspart 5 units with meals - SSI - CBG monitoring  Constipation Last BM 5 days ago. Ran out of her senna - Senna 2 tablet daily  Dispo: Admit patient to  Inpatient with expected length of stay greater than 2 midnights.  Signed: Iona Beard, MD 10/11/2022, 11:43 AM  Pager: [phone] After 5pm on weekdays and 1pm on weekends: On Call pager: [phone]

## 2022-10-11 NOTE — ED Triage Notes (Signed)
Pt arrives via POV with c/o shortness of breath and chest pain. Pt reports her breathing has become worse over the past several days, she is unable to walk to the bathroom without feeling like she is going to pass out from shortness of breath. Onset of chest pain tonight. Pt wears 4 liters normally (arrives on 4 liters [place]- saturations were 73%). Pt placed on nasal cannula at 6 liters, oxygen improved to 86-88%. Placed on NRB per provider, taken to treatment room.

## 2022-10-11 NOTE — ED Provider Notes (Signed)
[facility] EMERGENCY DEPARTMENT Provider Note   CSN: [id number] Arrival date & time: 10/11/22  0413     History  Chief Complaint  Patient presents with   Shortness of Breath    Alicia [name] is a 66 y.o. female.  Patient presents to the emergency department for evaluation of shortness of breath and chest pain.  Patient reports that her symptoms have progressed over a period of approximately 2 weeks.  She reports that her legs are more swollen than usual.  She has been experiencing significant shortness of breath with minimal exertion recently.  Tonight she cannot lie flat because it would cause her to be very short of breath.  She had to sit up on the edge of her bed to catch her breath.  She reports that she has a history of COPD and congestive heart failure.  She does use oxygen at home at 4 L by nasal cannula.  Patient with significant hypoxia despite using her 4 L at arrival to the ED, initiated increased oxygen with improvement.       Home Medications Prior to Admission medications   Medication Sig Start Date End Date Taking? Authorizing Provider  albuterol (PROVENTIL) (2.5 MG/3ML) 0.083% nebulizer solution Take 3 mLs (2.5 mg total) by nebulization every 6 (six) hours as needed for wheezing or shortness of breath. 07/22/22   Spero Geralds, MD  albuterol (VENTOLIN HFA) 108 (90 Base) MCG/ACT inhaler Inhale 2 puffs into the lungs every 6 (six) hours as needed. 07/22/22   Spero Geralds, MD  amLODipine (NORVASC) 10 MG tablet TAKE 1 TABLET BY MOUTH EVERY DAY 05/11/22   Axel Filler, MD  aspirin 81 MG tablet Take 81 mg by mouth daily.    [provider]  atorvastatin (LIPITOR) 80 MG tablet Take 1 tablet (80 mg total) by mouth daily. 06/25/22   Swinyer, Lanice Schwab, NP  B-D UF III MINI PEN NEEDLES 31G X 5 MM MISC USE 3 TIMES A DAY WITH HUMALOG 08/10/22   Axel Filler, MD  ENTRESTO 97-103 MG TAKE 1 TABLET BY MOUTH TWICE A DAY 07/10/21    Axel Filler, MD  ezetimibe (ZETIA) 10 MG tablet Take 1 tablet (10 mg total) by mouth daily. 09/09/22   Swinyer, Lanice Schwab, NP  FARXIGA 5 MG TABS tablet TAKE 1 TABLET BY MOUTH EVERY DAY BEFORE BREAKFAST 05/11/22   Axel Filler, MD  furosemide (LASIX) 40 MG tablet Take 2 tablets (80 mg total) by mouth daily. 11/18/21   Axel Filler, MD  glucose blood (ACCU-CHEK AVIVA PLUS) test strip USE TO TEST BLOOD SUGARS AS DIRECTED 08/06/21   Axel Filler, MD  insulin glargine (LANTUS SOLOSTAR) 100 UNIT/ML Solostar Pen Inject 40 Units into the skin at bedtime. 05/11/22   Axel Filler, MD  insulin lispro (HUMALOG KWIKPEN) 100 UNIT/ML KwikPen INJECT 15 UNITS WITH BREAKFAST AND 20 UNITS WITH DINNER Patient taking differently: INJECT 14 UNITS WITH BREAKFAST AND 20 UNITS WITH DINNER 11/28/21   Axel Filler, MD  Insulin Pen Needle 32G X 4 MM MISC Use to inject insulin 4 times a day. The patient is insulin requiring, ICD 10 code 11.10. The patient injects 4 times per day. 04/28/18   Oval Linsey, MD  Insulin Syringe-Needle U-100 31G X 15/64" 0.3 ML MISC Use to inject Humalog before meals three times a day 05/31/19   Axel Filler, MD  Lancets (ACCU-CHEK MULTICLIX) lancets Use to check your blood sugar  four times daily: early morning, before a meal, two hours after a meal, and bedtime 01/16/21   Axel Filler, MD  metoprolol tartrate (LOPRESSOR) 100 MG tablet TAKE 1 TABLET BY MOUTH 2 HOURS PRIOR TO THE CARDIAC CT 06/25/22   Swinyer, Lanice Schwab, NP  nystatin powder Apply 1 Application topically 3 (three) times daily. 08/17/22   Axel Filler, MD  PATADAY 0.1 % ophthalmic solution Place 1 drop into both eyes 2 (two) times daily. 08/11/21   Axel Filler, MD  senna (SENOKOT) 8.6 MG TABS tablet Take 2 tablets (17.2 mg total) by mouth daily. 04/14/21   Axel Filler, MD  Tiotropium Bromide-Olodaterol (STIOLTO RESPIMAT) 2.5-2.5 MCG/ACT  AERS Inhale 2 puffs into the lungs daily. 07/22/22   Spero Geralds, MD  triamcinolone (KENALOG) 0.025 % cream Apply 1 application topically 2 (two) times daily. 05/29/19   Axel Filler, MD  TRULICITY 1.5 OF/7.5ZW SOPN INJECT 1.5 MG INTO THE SKIN ONCE A WEEK. 05/05/22   Axel Filler, MD      Allergies    Beta adrenergic blockers and Canagliflozin    Review of Systems   Review of Systems  Physical Exam Updated Vital Signs BP (!) 144/73   Pulse 70   Temp 98.1 F (36.7 C)   Resp 17   LMP  (LMP Unknown)   SpO2 100%  Physical Exam Vitals and nursing note reviewed.  Constitutional:      General: She is not in acute distress.    Appearance: She is well-developed.  HENT:     Head: Normocephalic and atraumatic.     Mouth/Throat:     Mouth: Mucous membranes are moist.  Eyes:     General: Vision grossly intact. Gaze aligned appropriately.     Extraocular Movements: Extraocular movements intact.     Conjunctiva/sclera: Conjunctivae normal.  Cardiovascular:     Rate and Rhythm: Normal rate and regular rhythm.     Pulses: Normal pulses.     Heart sounds: Normal heart sounds, S1 normal and S2 normal. No murmur heard.    No friction rub. No gallop.  Pulmonary:     Effort: Tachypnea and accessory muscle usage present. No respiratory distress.     Breath sounds: Decreased breath sounds present.  Abdominal:     General: Bowel sounds are normal.     Palpations: Abdomen is soft.     Tenderness: There is no abdominal tenderness. There is no guarding or rebound.     Hernia: No hernia is present.  Musculoskeletal:        General: No swelling.     Cervical back: Full passive range of motion without pain, normal range of motion and neck supple. No spinous process tenderness or muscular tenderness. Normal range of motion.     Right lower leg: Edema present.     Left lower leg: Edema present.  Skin:    General: Skin is warm and dry.     Capillary Refill: Capillary refill  takes less than 2 seconds.     Findings: No ecchymosis, erythema, rash or wound.  Neurological:     General: No focal deficit present.     Mental Status: She is alert and oriented to person, place, and time.     GCS: GCS eye subscore is 4. GCS verbal subscore is 5. GCS motor subscore is 6.     Cranial Nerves: Cranial nerves 2-12 are intact.     Sensory: Sensation is intact.  Motor: Motor function is intact.     Coordination: Coordination is intact.  Psychiatric:        Attention and Perception: Attention normal.        Mood and Affect: Mood normal.        Speech: Speech normal.        Behavior: Behavior normal.     ED Results / Procedures / Treatments   Labs (all labs ordered are listed, but only abnormal results are displayed) Labs Reviewed  BASIC METABOLIC PANEL - Abnormal; Notable for the following components:      Result Value   Chloride 94 (*)    CO2 35 (*)    Glucose, Bld 115 (*)    Calcium 7.9 (*)    All other components within normal limits  CBC - Abnormal; Notable for the following components:   RBC 3.81 (*)    Hemoglobin 9.9 (*)    HCT 32.5 (*)    RDW 17.2 (*)    All other components within normal limits  I-STAT ARTERIAL BLOOD GAS, ED - Abnormal; Notable for the following components:   pH, Arterial 7.324 (*)    pCO2 arterial 71.7 (*)    pO2, Arterial 171 (*)    Bicarbonate 37.2 (*)    TCO2 39 (*)    Acid-Base Excess 9.0 (*)    Potassium 3.2 (*)    Calcium, Ion 1.13 (*)    HCT 32.0 (*)    Hemoglobin 10.9 (*)    All other components within normal limits  TROPONIN I (HIGH SENSITIVITY) - Abnormal; Notable for the following components:   Troponin I (High Sensitivity) 19 (*)    All other components within normal limits    EKG EKG Interpretation  Date/Time:  Sunday October 11 2022 04:35:36 EST Ventricular Rate:  65 PR Interval:  137 QRS Duration: 111 QT Interval:  453 QTC Calculation: 471 R Axis:   208 Text Interpretation: Ectopic atrial rhythm No  significant change since last tracing Confirmed by Orpah Greek [id number]) on 10/11/2022 4:50:21 AM  Radiology DG Chest Portable 1 View  Result Date: 10/11/2022 CLINICAL DATA:  Shortness of breath for several weeks, worse tonight. Hypoxia EXAM: PORTABLE CHEST 1 VIEW COMPARISON:  04/04/2020 FINDINGS: Cardiomegaly and vascular pedicle widening with diffuse interstitial coarsening. Small pleural effusions are likely. Artifact from EKG leads. IMPRESSION: CHF pattern. Electronically Signed   By: Jorje Guild M.D.   On: 10/11/2022 05:02    Procedures .Critical Care  Performed by: Orpah Greek, MD Authorized by: Orpah Greek, MD   Critical care provider statement:    Critical care time (minutes):  30   Critical care was necessary to treat or prevent imminent or life-threatening deterioration of the following conditions:  Respiratory failure   Critical care was time spent personally by me on the following activities:  Development of treatment plan with patient or surrogate, discussions with consultants, evaluation of patient's response to treatment, examination of patient, ordering and review of laboratory studies, ordering and review of radiographic studies, ordering and performing treatments and interventions, pulse oximetry, re-evaluation of patient's condition and review of old charts     Medications Ordered in ED Medications  furosemide (LASIX) injection 80 mg (80 mg Intravenous Given 10/11/22 0458)  methylPREDNISolone sodium succinate (SOLU-MEDROL) 125 mg/2 mL injection 80 mg (80 mg Intravenous Given 10/11/22 0458)  albuterol (PROVENTIL) (2.5 MG/3ML) 0.083% nebulizer solution (10 mg/hr Nebulization Given 10/11/22 0528)    ED Course/ Medical Decision Making/ A&P  Medical Decision Making Amount and/or Complexity of Data Reviewed External Data Reviewed: labs, radiology, ECG and notes. Labs: ordered. Decision-making details documented  in ED Course. Radiology: ordered and independent interpretation performed. Decision-making details documented in ED Course. ECG/medicine tests: ordered and independent interpretation performed. Decision-making details documented in ED Course.  Risk Prescription drug management.   Patient presents to the emergency department with shortness of breath.  Patient reports a worsening of her chronic shortness of breath over the past couple of weeks.  Review of her records reveals she does have a history of combined heart failure as well as COPD.  Patient hypoxic at arrival.  Her oxygen was increased with some improvement.  Lung examination reveals very diminished breath sounds bilaterally.  Chest x-ray, however, more consistent with heart failure.  Patient treated for both.  She was given IV Lasix, Solu-Medrol and butyryl/Atrovent nebulizer.  With bronchodilator therapy, patient now moving air better, having active wheezing.  Gas with some CO2 retention but she is mentating well.  Aeration is improving.  Considered BiPAP but she seems to be doing well after nebulizer, will continue to monitor.  Will require hospitalization for further management.        Final Clinical Impression(s) / ED Diagnoses Final diagnoses:  COPD exacerbation ([place])  Acute on chronic congestive heart failure, unspecified heart failure type [facility])    Rx / DC Orders ED Discharge Orders     None         Arch Methot, Gwenyth Allegra, MD 10/11/22 [phone]

## 2022-10-12 ENCOUNTER — Other Ambulatory Visit (HOSPITAL_COMMUNITY): Payer: Medicare Other

## 2022-10-12 ENCOUNTER — Inpatient Hospital Stay (HOSPITAL_COMMUNITY): Payer: Medicare Other

## 2022-10-12 DIAGNOSIS — E1169 Type 2 diabetes mellitus with other specified complication: Secondary | ICD-10-CM

## 2022-10-12 DIAGNOSIS — Z794 Long term (current) use of insulin: Secondary | ICD-10-CM

## 2022-10-12 DIAGNOSIS — J441 Chronic obstructive pulmonary disease with (acute) exacerbation: Secondary | ICD-10-CM

## 2022-10-12 DIAGNOSIS — I5021 Acute systolic (congestive) heart failure: Secondary | ICD-10-CM | POA: Diagnosis not present

## 2022-10-12 DIAGNOSIS — Z7984 Long term (current) use of oral hypoglycemic drugs: Secondary | ICD-10-CM

## 2022-10-12 DIAGNOSIS — I509 Heart failure, unspecified: Secondary | ICD-10-CM

## 2022-10-12 DIAGNOSIS — Z87891 Personal history of nicotine dependence: Secondary | ICD-10-CM | POA: Diagnosis not present

## 2022-10-12 LAB — ECHOCARDIOGRAM COMPLETE
Area-P 1/2: 3.87 cm2
MV M vel: 5.19 m/s
MV Peak grad: 107.7 mmHg
Radius: 0.6 cm
S' Lateral: 4.7 cm
Weight: 3460.34 oz

## 2022-10-12 LAB — CBC
HCT: 32.5 % — ABNORMAL LOW (ref 36.0–46.0)
Hemoglobin: 9.6 g/dL — ABNORMAL LOW (ref 12.0–15.0)
MCH: 25.4 pg — ABNORMAL LOW (ref 26.0–34.0)
MCHC: 29.5 g/dL — ABNORMAL LOW (ref 30.0–36.0)
MCV: 86 fL (ref 80.0–100.0)
Platelets: 260 10*3/uL (ref 150–400)
RBC: 3.78 MIL/uL — ABNORMAL LOW (ref 3.87–5.11)
RDW: 17.3 % — ABNORMAL HIGH (ref 11.5–15.5)
WBC: 13.2 10*3/uL — ABNORMAL HIGH (ref 4.0–10.5)
nRBC: 0 % (ref 0.0–0.2)

## 2022-10-12 LAB — GLUCOSE, CAPILLARY
Glucose-Capillary: 190 mg/dL — ABNORMAL HIGH (ref 70–99)
Glucose-Capillary: 174 mg/dL — ABNORMAL HIGH (ref 70–99)
Glucose-Capillary: 149 mg/dL — ABNORMAL HIGH (ref 70–99)
Glucose-Capillary: 222 mg/dL — ABNORMAL HIGH (ref 70–99)
Glucose-Capillary: 213 mg/dL — ABNORMAL HIGH (ref 70–99)

## 2022-10-12 LAB — BASIC METABOLIC PANEL
Anion gap: 12 (ref 5–15)
BUN: 21 mg/dL (ref 8–23)
CO2: 31 mmol/L (ref 22–32)
Calcium: 8.2 mg/dL — ABNORMAL LOW (ref 8.9–10.3)
Chloride: 96 mmol/L — ABNORMAL LOW (ref 98–111)
Creatinine, Ser: 1.01 mg/dL — ABNORMAL HIGH (ref 0.44–1.00)
GFR, Estimated: >60 mL/min (ref >60)
Glucose, Bld: 194 mg/dL — ABNORMAL HIGH (ref 70–99)
Potassium: 3.5 mmol/L (ref 3.5–5.1)
Sodium: 139 mmol/L (ref 135–145)

## 2022-10-12 LAB — MAGNESIUM: Magnesium: 1.4 mg/dL — ABNORMAL LOW (ref 1.7–2.4)

## 2022-10-12 MED ORDER — FUROSEMIDE 10 MG/ML IJ SOLN
80.0000 mg | Freq: Once | INTRAMUSCULAR | Status: AC
  Administered 2022-10-12: 80 mg via INTRAVENOUS
  Filled 2022-10-12: qty 8

## 2022-10-12 MED ORDER — IPRATROPIUM-ALBUTEROL 0.5-2.5 (3) MG/3ML IN SOLN: 3.0000 mL | RESPIRATORY_TRACT | Status: DC | PRN

## 2022-10-12 MED ORDER — MAGNESIUM SULFATE 4 GM/100ML IV SOLN
4.0000 g | Freq: Once | INTRAVENOUS | Status: AC
  Administered 2022-10-12: 4 g via INTRAVENOUS
  Filled 2022-10-12: qty 100

## 2022-10-12 MED ORDER — FUROSEMIDE 40 MG PO TABS
40.0000 mg | ORAL_TABLET | Freq: Two times a day (BID) | ORAL | Status: DC
  Administered 2022-10-13 (×2): 40 mg via ORAL
  Filled 2022-10-12 (×2): qty 1

## 2022-10-12 MED ORDER — PERFLUTREN LIPID MICROSPHERE
1.0000 mL | INTRAVENOUS | Status: AC | PRN
  Administered 2022-10-12: 3 mL via INTRAVENOUS

## 2022-10-12 NOTE — Progress Notes (Signed)
*  PRELIMINARY RESULTS* Echocardiogram 2D Echocardiogram has been performed with [facility].  Samuel Germany 10/12/2022, 3:19 PM

## 2022-10-12 NOTE — Hospital Course (Addendum)
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    Principal Problem:   Acute exacerbation of CHF (congestive heart failure) (HCC) Active Problems:   COPD exacerbation (HCC)   Type 2 diabetes mellitus with other specified complication ([place])     Assessment & Plan by Problem: Principal Problem:   Acute exacerbation of CHF (congestive heart failure) ([place])  Pt presented with worsening dyspnea on exertion with increasing oxygen requirement.  Patient is on home O2 4 L however she was requiring 6 L to comfortably breathe.  BNP elevated to 403. Chest x-ray with vascular congestion.  Patient's presentation consistent with acute CHF exacerbation secondary to volume overload.  Patient reports she is compliant with home Lasix.  She takes 80 mg twice daily. Troponin 19>17. No acute ischemic changes or arrhythmias on EKG. we will get echo to determine left ventricle function.   Patient denies chest pain, palpitations, dizziness. We will continue to diuresis with IV Lasix and strictly monitor I&O's.  Plan: Echo -IV Lasix 80 mg twice daily -Continue home Entresto 97 100 mg twice daily, Farxiga 5 mg daily.  Not on beta-blocker due to severe COPD. -Monitor electrolytes, I's and O's, weights -Monitor telemetry   COPD exacerbation Pt's breathing has improved.Trace Bibasilar course crackles likely from volume overload. She denies PND or Orthopnea. No cough noted during the exam. We will continue to diurese.  Chronically on 4 L at home, she is on O2 5L. Will gradually try to get on 4L -DuoNebs every 4 -Prednisone 40 mg daily - Will need outpatient PFTs   CAD HLD History of nonobstructive CAD. Chest pain on admission has since resolved. Troponin 19>17. EKG in NSR. Suspect CP in setting of  COPD and CHF exacerbation -Continue ASA 81 mg, atorvastatin 80 mg and Zetia 10 mg daily   T2DM A1c 7.7% on 08/17/2022. Home medications include faxiga, trulicity 1.5 mg weekly, lantus 40 units nightly, and novolg 15 unit with breakfast and 20 units with dinner.  -Semglee 40 units daily - SSI - CBG monitoring   Constipation Last BM 5 days ago. Ran out of her senna - Senna 2 tablet daily   10/12/22: Patient is having difficulty breathing at this time but is feeling at her baseline. Patient reports that she started coughing when she got here. It is a dry cough. It is not worse with laying flat. [place]

## 2022-10-12 NOTE — Progress Notes (Signed)
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  Overnight Events: No events overnight   Objective:  Vital signs in last 24 hours: Vitals:   10/12/22 0813 10/12/22 0827 10/12/22 1100 10/12/22 1203  BP:    132/80  Pulse:    (!) 59  Resp:    16  Temp:    98.4 F (36.9 C)  TempSrc:    Oral  SpO2: 91% 93%  (!) 89%  Weight:   98.1 kg    Supplemental O2: Room Air SpO2: (!) 89 % O2 Flow Rate (L/min): 4 L/min   Physical Exam:  Constitutional: well-appearing female laying comfortably ion bed, in no acute distress HENT: normocephalic atraumatic, mucous membranes moist Eyes: conjunctiva non-erythematous Neck: supple Cardiovascular: regular rate and rhythm, no m/r/g Pulmonary/Chest: Bibasilar coarse crackles. No wheeze, R/R appreciated Abdominal: soft, non-tender, non-distended MSK: normal bulk and tone Neurological: alert & oriented x 3 Skin: warm and dry Psych: Mood and affect appropriate   Filed Weights   10/12/22 1100  Weight: 98.1 kg     Intake/Output Summary (Last 24 hours) at 10/12/2022 1329 Last data filed at 10/12/2022 1300 Gross per 24 hour  Intake --  Output 1725 ml  Net -1725 ml   Net IO Since Admission: -1,725 mL [10/12/22 1329]  Pertinent Labs:    Latest Ref Rng & Units 10/12/2022    2:27 AM 10/11/2022    5:22 AM 10/11/2022    4:42 AM  CBC  WBC 4.0 - 10.5 K/uL 13.2   7.1   Hemoglobin 12.0 - 15.0 g/dL 9.6  10.9  9.9   Hematocrit 36.0 - 46.0 % 32.5  32.0  32.5   Platelets 150 - 400 K/uL 260   260        Latest Ref Rng & Units 10/12/2022    2:27 AM 10/11/2022    5:22 AM 10/11/2022    4:42 AM  CMP  Glucose 70 - 99 mg/dL 194   115   BUN 8 - 23 mg/dL 21   14   Creatinine 0.44 - 1.00 mg/dL 1.01   0.90    Sodium 135 - 145 mmol/L 139  139  138   Potassium 3.5 - 5.1 mmol/L 3.5  3.2  3.8   Chloride 98 - 111 mmol/L 96   94   CO2 22 - 32 mmol/L 31   35   Calcium 8.9 - 10.3 mg/dL 8.2   7.9     Imaging: No results found.  Assessment/Plan:   Principal Problem:   Acute exacerbation of CHF (congestive heart failure) (HCC) Active Problems:   COPD exacerbation (HCC)   Type 2 diabetes mellitus with other specified complication ([place])   Assessment & Plan by Problem: Principal Problem:   Acute exacerbation of CHF (congestive heart failure) ([place])  Pt presented with worsening dyspnea on exertion with increasing oxygen requirement.  Patient is on home O2 4 L however she was requiring 6 L to comfortably breathe.  BNP elevated to 403. Chest x-ray with vascular congestion.  Patient's presentation consistent with acute CHF exacerbation secondary to volume overload.  Patient reports she is compliant with home Lasix.  She takes 80 mg twice daily. Troponin 19>17. No acute ischemic changes or arrhythmias on  EKG. we will get echo to determine left ventricle function.   Patient denies chest pain, palpitations, dizziness. We will continue to diuresis with IV Lasix and strictly monitor I&O's.  Plan: Echo -IV Lasix 80 mg twice daily -Continue home Entresto 97 100 mg twice daily, Farxiga 5 mg daily.  Not on beta-blocker due to severe COPD. -Monitor electrolytes, I's and O's, weights -Monitor telemetry   COPD exacerbation Pt's breathing has improved.Trace Bibasilar course crackles likely from volume overload. She denies PND or Orthopnea. No cough noted during the exam. We will continue to diurese.  Chronically on 4 L at home, she is on O2 5L. Will gradually try to get on 4L -DuoNebs every 4 -Prednisone 40 mg daily - Will need outpatient PFTs   CAD HLD History of nonobstructive CAD. Chest pain on admission has since resolved. Troponin 19>17. EKG in NSR. Suspect CP in setting of COPD and CHF  exacerbation -Continue ASA 81 mg, atorvastatin 80 mg and Zetia 10 mg daily   T2DM A1c 7.7% on 08/17/2022. Home medications include faxiga, trulicity 1.5 mg weekly, lantus 40 units nightly, and novolg 15 unit with breakfast and 20 units with dinner.  -Semglee 40 units daily - SSI - CBG monitoring   Constipation Last BM 5 days ago. Ran out of her senna - Senna 2 tablet daily   Teola Bradley, MD Internal Medicine Resident PGY-1 Pager: [phone] Please contact the on call pager after 5 pm and on weekends at [phone].

## 2022-10-13 ENCOUNTER — Other Ambulatory Visit (HOSPITAL_COMMUNITY): Payer: Self-pay

## 2022-10-13 ENCOUNTER — Encounter (HOSPITAL_COMMUNITY): Payer: Self-pay | Admitting: Internal Medicine

## 2022-10-13 ENCOUNTER — Encounter (INDEPENDENT_AMBULATORY_CARE_PROVIDER_SITE_OTHER): Payer: Medicare Other | Admitting: Ophthalmology

## 2022-10-13 DIAGNOSIS — I509 Heart failure, unspecified: Secondary | ICD-10-CM | POA: Diagnosis not present

## 2022-10-13 DIAGNOSIS — E1169 Type 2 diabetes mellitus with other specified complication: Secondary | ICD-10-CM | POA: Diagnosis not present

## 2022-10-13 DIAGNOSIS — J441 Chronic obstructive pulmonary disease with (acute) exacerbation: Secondary | ICD-10-CM | POA: Diagnosis not present

## 2022-10-13 DIAGNOSIS — Z87891 Personal history of nicotine dependence: Secondary | ICD-10-CM | POA: Diagnosis not present

## 2022-10-13 LAB — CBC
HCT: 33.3 % — ABNORMAL LOW (ref 36.0–46.0)
Hemoglobin: 10.5 g/dL — ABNORMAL LOW (ref 12.0–15.0)
MCH: 26.5 pg (ref 26.0–34.0)
MCHC: 31.5 g/dL (ref 30.0–36.0)
MCV: 84.1 fL (ref 80.0–100.0)
Platelets: 273 10*3/uL (ref 150–400)
RBC: 3.96 MIL/uL (ref 3.87–5.11)
RDW: 17.2 % — ABNORMAL HIGH (ref 11.5–15.5)
WBC: 11.6 10*3/uL — ABNORMAL HIGH (ref 4.0–10.5)
nRBC: 0.2 % (ref 0.0–0.2)

## 2022-10-13 LAB — BASIC METABOLIC PANEL
Anion gap: 10 (ref 5–15)
Anion gap: 10 (ref 5–15)
BUN: 25 mg/dL — ABNORMAL HIGH (ref 8–23)
BUN: 27 mg/dL — ABNORMAL HIGH (ref 8–23)
CO2: 36 mmol/L — ABNORMAL HIGH (ref 22–32)
CO2: 34 mmol/L — ABNORMAL HIGH (ref 22–32)
Calcium: 8.5 mg/dL — ABNORMAL LOW (ref 8.9–10.3)
Calcium: 8.6 mg/dL — ABNORMAL LOW (ref 8.9–10.3)
Chloride: 92 mmol/L — ABNORMAL LOW (ref 98–111)
Chloride: 93 mmol/L — ABNORMAL LOW (ref 98–111)
Creatinine, Ser: 0.92 mg/dL (ref 0.44–1.00)
Creatinine, Ser: 1.07 mg/dL — ABNORMAL HIGH (ref 0.44–1.00)
GFR, Estimated: >60 mL/min (ref >60)
GFR, Estimated: 58 mL/min — ABNORMAL LOW (ref >60)
Glucose, Bld: 198 mg/dL — ABNORMAL HIGH (ref 70–99)
Glucose, Bld: 286 mg/dL — ABNORMAL HIGH (ref 70–99)
Potassium: 3.7 mmol/L (ref 3.5–5.1)
Potassium: 4.5 mmol/L (ref 3.5–5.1)
Sodium: 138 mmol/L (ref 135–145)
Sodium: 137 mmol/L (ref 135–145)

## 2022-10-13 LAB — MAGNESIUM: Magnesium: 2.1 mg/dL (ref 1.7–2.4)

## 2022-10-13 LAB — GLUCOSE, CAPILLARY
Glucose-Capillary: 178 mg/dL — ABNORMAL HIGH (ref 70–99)
Glucose-Capillary: 166 mg/dL — ABNORMAL HIGH (ref 70–99)
Glucose-Capillary: 293 mg/dL — ABNORMAL HIGH (ref 70–99)

## 2022-10-13 MED ORDER — UMECLIDINIUM BROMIDE 62.5 MCG/ACT IN AEPB
1.0000 | INHALATION_SPRAY | Freq: Every day | RESPIRATORY_TRACT | 0 refills | Status: AC
  Filled 2022-10-13: qty 30, 30d supply, fill #0

## 2022-10-13 MED ORDER — INSULIN LISPRO (1 UNIT DIAL) 100 UNIT/ML (KWIKPEN)
10.0000 [IU] | PEN_INJECTOR | Freq: Three times a day (TID) | SUBCUTANEOUS | 11 refills | Status: DC
  Filled 2022-10-13: qty 15, 50d supply, fill #0

## 2022-10-13 MED ORDER — MOMETASONE FURO-FORMOTEROL FUM 100-5 MCG/ACT IN AERO
2.0000 | INHALATION_SPRAY | Freq: Two times a day (BID) | RESPIRATORY_TRACT | 0 refills | Status: DC
  Filled 2022-10-13: qty 13, 30d supply, fill #0

## 2022-10-13 MED ORDER — INSULIN GLARGINE-YFGN 100 UNIT/ML ~~LOC~~ SOLN
40.0000 [IU] | Freq: Every day | SUBCUTANEOUS | 11 refills | Status: DC
  Filled 2022-10-13: qty 10, 25d supply, fill #0

## 2022-10-13 MED ORDER — SPIRONOLACTONE 25 MG PO TABS
25.0000 mg | ORAL_TABLET | Freq: Every day | ORAL | Status: DC
  Administered 2022-10-13: 25 mg via ORAL
  Filled 2022-10-13: qty 1

## 2022-10-13 MED ORDER — DAPAGLIFLOZIN PROPANEDIOL 10 MG PO TABS: 10.0000 mg | ORAL_TABLET | Freq: Every day | ORAL | Status: DC

## 2022-10-13 MED ORDER — FUROSEMIDE 10 MG/ML IJ SOLN
80.0000 mg | Freq: Once | INTRAMUSCULAR | Status: AC
  Administered 2022-10-13: 80 mg via INTRAVENOUS
  Filled 2022-10-13: qty 8

## 2022-10-13 MED ORDER — ENOXAPARIN SODIUM 60 MG/0.6ML IJ SOSY
50.0000 mg | PREFILLED_SYRINGE | Freq: Every day | INTRAMUSCULAR | Status: DC
  Administered 2022-10-13: 50 mg via SUBCUTANEOUS
  Filled 2022-10-13: qty 0.6

## 2022-10-13 MED ORDER — INSULIN ASPART 100 UNIT/ML IJ SOLN
10.0000 [IU] | Freq: Three times a day (TID) | INTRAMUSCULAR | Status: DC
  Administered 2022-10-13 (×3): 10 [IU] via SUBCUTANEOUS

## 2022-10-13 NOTE — Evaluation (Addendum)
Physical Therapy Evaluation & Discharge Patient Details Name: Alicia [name] MRN: [id number] DOB: 1956-03-19 Today's Date: 10/13/2022  History of Present Illness  Pt is a 66 y.o. female admitted 10/11/22 with worsening SOB and cough. Workup for CHF, COPD exacerbation. PMH includes CHF, COPD (4-5L O2 baseline), DM2, CVA.   Clinical Impression  Patient evaluated by Physical Therapy with no further acute PT needs identified. PTA, pt limited ambulator due to chronic SOB, lives with family who assist with iADLs as needed, otherwise pt mod indep. Today, pt moving well with RW; of note, SpO2 down to 79% on 6L O2 [place] with ambulation, quick return to >86% with seated rest; pt maintaining >/92% on 4L O2 at rest. Pt reports, "That's why I never walk that far at home, my oxygen always drops low if I do." Pt aware of strategies for recovering low SpO2 with mobility at home; pt declines follow-up with HHPT services. All education has been completed and the patient has no further questions. Acute PT is signing off. Thank you for this referral.   Recommendations for follow up therapy are one component of a multi-disciplinary discharge planning process, led by the attending physician.  Recommendations may be updated based on patient status, additional functional criteria and insurance authorization.  Follow Up Recommendations No PT follow up      Assistance Recommended at Discharge Intermittent Supervision/Assistance  Patient can return home with the following  Assistance with cooking/housework;Assist for transportation;Help with stairs or ramp for entrance    Equipment Recommendations Rollator (4 wheels);BSC/3in1  Recommendations for Other Services       Functional Status Assessment Patient has not had a recent decline in their functional status     Precautions / Restrictions Precautions Precautions: Fall;Other (comment) Precaution Comments: watch SpO2 (wears 4-5L O2 baseline, reports SpO2 drops  frequently at home with baseline numbers down to 70s-80s%) Restrictions Weight Bearing Restrictions: No      Mobility  Bed Mobility Overal bed mobility: Modified Independent             General bed mobility comments: HOB slightly elevated    Transfers Overall transfer level: Modified independent Equipment used: Rolling walker (2 wheels)               General transfer comment: mod indep standing from EOB and recliner    Ambulation/Gait Ambulation/Gait assistance: Modified independent (Device/Increase time) Gait Distance (Feet): 160 Feet Assistive device: Rolling walker (2 wheels) Gait Pattern/deviations: Step-through pattern, Decreased stride length, Trunk flexed Gait velocity: Decreased     General Gait Details: slow, fatigued gait with RW; mod indep though cues for activity pacing. SpO2 down to 79% on 6L O2 [place] (pt reports, "I never walk that far for that very reason"); quick increased to >/86% with seated rest  Stairs            Wheelchair Mobility    Modified Rankin (Stroke Patients Only)       Balance Overall balance assessment: Needs assistance, Modified Independent   Sitting balance-Leahy Scale: Good Sitting balance - Comments: pt opting to sit to finish doffing underwear   Standing balance support: No upper extremity supported, Bilateral upper extremity supported, During functional activity Standing balance-Leahy Scale: Fair Standing balance comment: can static stand and take steps withtout UE support, preference for walker, endorses fear of falling                             Pertinent Vitals/Pain  Pain Assessment Pain Assessment: No/denies pain    Home Living Family/patient expects to be discharged to:: Private residence Living Arrangements: Children Available Help at Discharge: Family;Available PRN/intermittently Type of Home: House Home Access: Level entry     Alternate Level Stairs-Number of Steps: flight Home  Layout: Two level;Bed/bath upstairs Home Equipment: Cane - single point Additional Comments: lives with daughter who works, 59 and 80 y.o. grandkids who go to school    Prior Function Prior Level of Function : Independent/Modified Independent;Driving             Mobility Comments: Typically mod indep with intermittent use of SPC. limited ambulator due to SOB, uses scooter at stores, "crawls" up steps if necessary due to SOB; checks SpO2 daily at home. drives ADLs Comments: indep with ADLs (sits for dressing due to fear of falling); daughter does majority of cooking     Hand Dominance        Extremity/Trunk Assessment   Upper Extremity Assessment Upper Extremity Assessment: Overall WFL for tasks assessed    Lower Extremity Assessment Lower Extremity Assessment: Overall WFL for tasks assessed       Communication   Communication: HOH  Cognition Arousal/Alertness: Awake/alert Behavior During Therapy: WFL for tasks assessed/performed Overall Cognitive Status: Within Functional Limits for tasks assessed                                 General Comments: WFL for simple tasks, not formally assessed. suspect baseline        General Comments General comments (skin integrity, edema, etc.): discussed rollator use for added stability and energy conservation; pt interested in this and shower seat for energy conserv    Exercises     Assessment/Plan    PT Assessment Patient does not need any further PT services  PT Problem List         PT Treatment Interventions      PT Goals (Current goals can be found in the Care Plan section)  Acute Rehab PT Goals Patient Stated Goal: home today PT Goal Formulation: All assessment and education complete, DC therapy    Frequency       Co-evaluation               AM-PAC PT "6 Clicks" Mobility  Outcome Measure Help needed turning from your back to your side while in a flat bed without using bedrails?: None Help  needed moving from lying on your back to sitting on the side of a flat bed without using bedrails?: None Help needed moving to and from a bed to a chair (including a wheelchair)?: None Help needed standing up from a chair using your arms (e.g., wheelchair or bedside chair)?: None Help needed to walk in hospital room?: None Help needed climbing 3-5 steps with a railing? : A Little 6 Click Score: 23    End of Session Equipment Utilized During Treatment: Oxygen Activity Tolerance: Patient tolerated treatment well Patient left: in chair;with call bell/phone within reach Nurse Communication: Mobility status PT Visit Diagnosis: Other abnormalities of gait and mobility (R26.89)    Time: 9741-6384 PT Time Calculation (min) (ACUTE ONLY): 28 min   Charges:   PT Evaluation $PT Eval Moderate Complexity: [place], PT, DPT Acute Rehabilitation Services  Personal: [facility] Office: [phone]  Derry Lory 10/13/2022, 11:58 AM

## 2022-10-13 NOTE — Discharge Summary (Signed)
Name: Alicia [name] MRN: [id number] DOB: [birth year] 66 y.o. PCP: Axel Filler, MD  Date of Admission: 10/11/2022  4:16 AM Date of Discharge:  10/13/2022 Attending Physician: Campbell Riches, MD  Discharge Diagnosis: 1. 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    Discharge Medications: Allergies as of 10/13/2022       Reactions   Beta Adrenergic Blockers Other (See Comments)   Sinus pauses   Canagliflozin Itching, Anxiety, Palpitations        Medication List     STOP taking these medications    insulin lispro 100 UNIT/ML KwikPen Commonly known as: HumaLOG KwikPen   Lantus SoloStar 100 UNIT/ML Solostar Pen Generic drug: insulin glargine   metoprolol tartrate 100 MG tablet Commonly known as: LOPRESSOR   Stiolto Respimat 2.5-2.5 MCG/ACT Aers Generic drug: Tiotropium Bromide-Olodaterol       TAKE these medications    Accu-Chek Aviva Plus test strip Generic drug: glucose blood USE TO TEST BLOOD SUGARS AS DIRECTED   accu-chek multiclix lancets Use to check your blood sugar four times daily: early morning, before a meal, two hours after a meal, and bedtime   albuterol 108 (90 Base) MCG/ACT inhaler Commonly known as: VENTOLIN HFA Inhale 2 puffs into the lungs every 6 (six) hours as needed. What changed:  reasons to take this Another medication with the same name was removed. Continue taking this medication, and follow the directions you see here.   amLODipine 10 MG tablet Commonly known as: NORVASC TAKE 1 TABLET BY MOUTH EVERY  DAY   aspirin 81 MG tablet Take 81 mg by mouth daily.   atorvastatin 80 MG tablet Commonly known as: LIPITOR Take 1 tablet (80 mg total) by mouth daily.   Entresto 97-103 MG Generic drug: sacubitril-valsartan TAKE 1 TABLET BY MOUTH TWICE A DAY   ezetimibe 10 MG tablet Commonly known as: ZETIA Take 1 tablet (10 mg total) by mouth daily.   Farxiga 5 MG Tabs tablet Generic drug: dapagliflozin propanediol TAKE 1 TABLET BY MOUTH EVERY DAY BEFORE BREAKFAST What changed: See the new instructions.   furosemide 40 MG tablet Commonly known as: LASIX Take 2 tablets (80 mg total) by mouth daily.   insulin aspart 100 UNIT/ML injection Commonly known as: novoLOG Inject 10 Units into the skin 3 (three) times daily with meals. Start taking on: October 14, 2022   insulin glargine-yfgn 100 UNIT/ML injection Commonly known as: SEMGLEE Inject 0.4 mLs (40 Units total) into the skin at bedtime.   Insulin Pen Needle 32G X 4 MM Misc Use to inject insulin 4 times a day. The patient is insulin requiring, ICD 10 code 11.10. The patient injects 4 times per day.   B-D UF III MINI PEN NEEDLES 31G X 5 MM Misc Generic drug: Insulin Pen Needle USE 3 TIMES A DAY WITH HUMALOG   Insulin Syringe-Needle U-100 31G X 15/64" 0.3 ML Misc Use to inject Humalog before meals three times a day   mometasone-formoterol 100-5 MCG/ACT Aero Commonly known as: DULERA Inhale 2 puffs into the lungs 2 (two) times daily.  Start taking on: October 14, 2022   nystatin powder Commonly known as: nystatin Apply 1 Application topically 3 (three) times daily. What changed: when to take this   Pataday 0.1 % ophthalmic solution Generic drug: olopatadine Place 1 drop into both eyes 2 (two) times daily. What changed:  when to take this reasons to take this   senna 8.6 MG Tabs tablet Commonly known as: SENOKOT Take 2 tablets (17.2 mg total) by mouth daily.   triamcinolone 0.025 % cream Commonly known as:  KENALOG Apply 1 application topically 2 (two) times daily.   Trulicity 1.5 TF/5.7DU Sopn Generic drug: Dulaglutide INJECT 1.5 MG INTO THE SKIN ONCE A WEEK. What changed: additional instructions   umeclidinium bromide 62.5 MCG/ACT Aepb Commonly known as: INCRUSE ELLIPTA Inhale 1 puff into the lungs daily. Start taking on: October 14, 2022       Disposition and follow-up:   [name] was discharged from [facility] in Stable condition.  At the hospital follow up visit please address:  1.  Shortness of breath  2.  Labs / imaging needed at time of follow-up: none  3.  Pending labs/ test needing follow-up: none  Follow-up Appointments:  Follow-up Information     [facility]. Go in 14 day(s).   Specialty: Cardiology Why: Hospital follow up PLEASE bring a current medication list to appointment FREE valet parking, Entrance C, off [employer] information: [address] [id number] [place] [facility] up.   Why: BSC and Rollator. Contact information: [place] [address] [place] by problem list:  1. Assessment & Plan by Problem: Principal Problem:   Acute exacerbation of CHF (congestive heart failure) (HCC)   Pt presented with worsening dyspnea on exertion with increasing oxygen requirement.  Patient is on home O2 4 L however she was requiring 6 L to comfortably breathe.  BNP elevated to 403. Chest x-ray with vascular congestion.  Patient's presentation consistent with acute CHF exacerbation secondary to volume overload.  Patient reports she is compliant with home Lasix.  She takes 80 mg twice daily. Troponin 19>17. No acute ischemic changes or arrhythmias on EKG. Echo done on November 2023 showed ejection fraction of 55 to 60% which has improved from 45 to  50% (06/2022). Patient's presentation consistent with acute CHF exacerbation secondary to volume overload. She gets IV lasix 80 mg BID. Output for 24 hours 3210 mL. Pedal edema has completely resolved however coarse bibasilar crackles appreciated to auscultation. Pt's breathing improved during the course of her hospital stay. Plan: -Continue Lasix 80 mg daily -Continue home Entresto 97 103 mg twice daily, Farxiga 5 mg daily.  Not on beta-blocker due to severe COPD.   COPD exacerbation Pt's breathing has improved.Trace Bibasilar course crackles likely from volume overload. She denies PND or Orthopnea. No cough noted during the exam. We will continue to diurese.  Chronically on 4 L at home, she is on O2 5L. Will gradually try to get on 4L -DuoNebs every 4 -Prednisone 40 mg daily - Will need outpatient PFTs   CAD HLD History of nonobstructive CAD. Chest pain on admission has since resolved. Troponin 19>17. EKG in NSR. Suspect CP in setting of COPD and CHF exacerbation -Continue ASA 81 mg, atorvastatin 80 mg  and Zetia 10 mg daily   T2DM A1c 7.7% on 08/17/2022. Pt's blood glucose running around 190. Pt getting 5 units novolog TID, increased to 10 units 3 times daily for adequate blood sugar control.  Will increase short acting insulin. Home medications include faxiga, trulicity 1.5 mg weekly, lantus 40 units nightly. -Semglee 40 units daily -Novolog 10 units TID - SSI - CBG monitoring   Constipation Last BM 5 days ago. Senna 2 tablet daily.  -Resolved   Discharge Exam:    Constitutional: well-appearing female, laying in bed, in no acute distress HENT: normocephalic atraumatic, mucous membranes moist Eyes: conjunctiva non-erythematous Neck: supple Cardiovascular: regular rate and rhythm, no m/r/g Pulmonary/Chest: Mild bibasilar coarse crackles.  No [place] limitation, wheezing or rhonchi appreciated. Abdominal: soft, non-tender, non-distended MSK: No pedal edema.   Neurological: alert  & oriented x 3 Skin: warm and dry Psych: Normal mood and affect.   BP (!) 150/69   Pulse 68   Temp 98.4 F (36.9 C)   Resp 18   Wt 99.1 kg   LMP  (LMP Unknown)   SpO2 98%   BMI 34.22 kg/m  Discharge exam:     Pertinent Labs, Studies, and Procedures:   ECHO: 11-10-2022 FINDINGS   Left Ventricle: Left ventricular ejection fraction, by estimation, is 55  to 60%. Left ventricular ejection fraction by PLAX is 56 %. The left  ventricle has normal function. The left ventricle has no regional wall  motion abnormalities. Definity contrast  agent was given IV to delineate the left ventricular endocardial borders.  The left ventricular internal cavity size was moderately dilated. There is  mild concentric left ventricular hypertrophy. Left ventricular diastolic  parameters are consistent with  Grade II diastolic dysfunction (pseudonormalization).   Right Ventricle: The right ventricular size is normal. No increase in  right ventricular wall thickness. Right ventricular systolic function is  normal. There is mildly elevated pulmonary artery systolic pressure. The  tricuspid regurgitant velocity is 2.32   m/s, and with an assumed right atrial pressure of 15 mmHg, the estimated  right ventricular systolic pressure is 67.2 mmHg.   Left Atrium: Left atrial size was severely dilated.   Right Atrium: Right atrial size was moderately dilated.   Pericardium: There is no evidence of pericardial effusion.   Mitral Valve: The mitral valve is normal in structure. Trivial mitral  valve regurgitation. No evidence of mitral valve stenosis.   Tricuspid Valve: The tricuspid valve is normal in structure. Tricuspid  valve regurgitation is trivial. No evidence of tricuspid stenosis.   Aortic Valve: The aortic valve is tricuspid. Aortic valve regurgitation is  not visualized. No aortic stenosis is present.   Pulmonic Valve: The pulmonic valve was normal in structure. Pulmonic valve   regurgitation is not visualized. No evidence of pulmonic stenosis.   Aorta: The aortic root is normal in size and structure.   Venous: The inferior vena cava is normal in size with greater than 50%  respiratory variability, suggesting right atrial pressure of 3 mmHg.   IAS/Shunts: No atrial level shunt detected by color flow Doppler.    Discharge Instructions: Discharge Instructions     (HEART FAILURE PATIENTS) Call MD:  Anytime you have any of the following symptoms: 1) 3 pound weight gain in 24 hours or 5 pounds in 1 week 2) shortness of breath, with or without a dry hacking cough 3) swelling in the hands, feet or stomach 4) if you have to sleep on extra pillows at night in  order to breathe.   Complete by: As directed    Call MD for:   Complete by: As directed    Call MD for:  extreme fatigue   Complete by: As directed    Call MD for:  persistant dizziness or light-headedness   Complete by: As directed    Call MD for:  temperature >100.4   Complete by: As directed    Diet - low sodium heart healthy   Complete by: As directed    Discharge instructions   Complete by: As directed    Continue to take all your prescription medications. FU with PCP x 3 days   Increase activity slowly   Complete by: As directed        Signed: Teola Bradley, MD 10/13/2022, 7:43 PM   Pager: [phone]  Internal Medicine Resident-PGY-1

## 2022-10-13 NOTE — Progress Notes (Signed)
   Heart Failure Stewardship Pharmacist Progress Note   PCP: Axel Filler, MD PCP-Cardiologist: Candee Furbish, MD    HPI:  66 yo F with PMH of COPD, oxygen dependent, CHF (EF 20-25% in 2015, recovered to 55-60%), CAD, HLD, GERD, T2DM, and CVA.   She presented to the ED on 11/19 with shortness of breath, abdominal and LE edema, and chest pain. CXR consistent with CHF. ECHO done on 11/20 and LVEF is 55-60%.   Current HF Medications: Diuretic: furosemide 80 mg IV x 1 then 40 mg PO BID ACE/ARB/ARNI: Entresto 97/103 mg BID SGLT2i: Farxiga 5 mg daily  Prior to admission HF Medications: Diuretic: furosemide 80 mg BID (although ordered as once daily) ACE/ARB/ARNI: Entresto 97/103 mg BID SGLT2i: Farxiga 5 mg daily *not taking metoprolol   Pertinent Lab Values: Serum creatinine 0.92, BUN 25, Potassium 3.7, Sodium 138, BNP 403.8, Magnesium 2.1, A1c 7.7   Vital Signs: Weight: 218 lbs (admission weight: 216 lbs) Blood pressure: 160/60s  Heart rate: 50-60s  I/O: -2.9L yesterday; net -4.1L  Medication Assistance / Insurance Benefits Check: Does the patient have prescription insurance?  Yes Type of insurance plan: Medicare  Outpatient Pharmacy:  Prior to admission [facility] Is the patient willing to use [facility] at discharge? Yes Is the patient willing to transition their outpatient pharmacy to utilize a [facility] outpatient pharmacy?   Pending    Assessment: 1. 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 NYHA class II symptoms. - Furosemide 80 mg IV x 1 then transitioning to furosemide 40 mg BID. Strict I/Os and daily weights.  - Previously on metoprolol, stopped with sinus pauses in 2019. Has been bradycardic this admission. - Continue Entresto 97/103 mg BID - Consider starting spironolactone 25 mg daily. Has been on this in the past, unclear why it was discontinued. Discussed with patient, she cannot recall why this  was stopped in the past. - On Farxiga 5 mg daily (diabetes dosing), consider increasing to 10 mg daily for HF dosing.   Plan: 1) Medication changes recommended at this time: - Increase Farxiga to 10 mg daily - Start spironolactone 25 mg daily  2) Patient assistance: - Copays $0  3)  Education  - Patient has been educated on current HF medications and potential additions to HF medication regimen - Patient verbalizes understanding that over the next few months, these medication doses may change and more medications may be added to optimize HF regimen - Patient has been educated on basic disease state pathophysiology and goals of therapy   Kerby Nora, PharmD, BCPS Heart Failure Stewardship Pharmacist Phone [phone]

## 2022-10-13 NOTE — Plan of Care (Signed)

## 2022-10-13 NOTE — Discharge Instructions (Addendum)
Take all your prescribed medications. You will follow-up with internal medicine clinic.  Someone will call you from the internal medicine clinic to schedule the appointment. If you start to experience chest pain, shortness of breath, leg swelling, abdominal pain please return to emergency department for further evaluation.

## 2022-10-13 NOTE — Progress Notes (Signed)
Heart Failure Nurse Navigator Progress Note  PCP: Axel Filler, MD PCP-Cardiologist: Marlou Porch Admission Diagnosis: None Admitted from: Home  Presentation:   Alicia [name] presented with shortness of breath, bilateral lower leg edema, and abdominal pain. CXR showed CHF, wears 4 L oxygen at home, BNP 403, Patient reports that she has gained around 10 pounds recently. IV lasix given.   Patient was educated on the sign and symptoms of heart failure, daily weights, when to call her doctor or go to the ED. Diet/ fluid restrictions, reports that she does drink a number of pepsi a day, taking all her medication as prescribed and attending all medical appointments. Patient verbalized her understanding of the education, a hospital HF [facility] appointment was scheduled for 10/28/22.   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  Non-functioning   Aortic atherosclerosis ([place]) 09/24/2017   Asymptomatic, found on CT scan   Blood transfusion without reported diagnosis    Chronic Systolic Heart Failure 2/35/5732   Felt to be non-ischemic and secondary to hypertension.  Echo (05/28/2014): LVEF 25%.  Is not interested in AICD placement.   COPD exacerbation ([place])    Coronary artery disease involving native coronary artery of native heart without angina pectoris 11/09/2014   Cardiac cath (02/18/2014): Non-obstructive, mRCA 30%, dRCA 60%   Cystocele with uterine prolapse - grade 3 02/12/2016   Not interested in pessary after trying   Diverticulosis of colon 09/24/2017   Diverticulosis of colon 09/24/2017   Essential hypertension 10/15/2013   Gastroesophageal reflux disease 09/24/2017   History of cerebrovascular accident 05/10/2013   Per patient report 6 previous strokes, most recent on  05/10/13.  No residual deficits.   History of cerebrovascular accident 05/10/2013   Per patient report 6 previous strokes, most recent on 05/10/13.  No residual deficits.   Hyperlipidemia    Overweight (BMI 25.0-29.9) 09/24/2017   Psoriasis 09/24/2017   Seasonal allergic rhinitis due to pollen 09/24/2017   Spring and early Fall   Small Bowel Obstruction (SBO) 01/13/2014   Ex-Lap & Lysis of Adhesion, 01/15/2014   Thyroid nodule 09/04/2019   Tobacco use disorder 01/15/2014   Type 2 diabetes mellitus with moderate nonproliferative diabetic retinopathy ([place]) 10/15/2013     Social History   Socioeconomic History   Marital status: Widowed    Spouse name: Not on file   Number of children: 3   Years of education: Not on file   Highest education level: High school graduate  Occupational History   Occupation: Disabled    Comment: Formerly worked in [place] Use   Smoking status: Former    Packs/day: 1.00    Years: 40.00    Total pack years: 40.00    Types: Cigarettes    Quit date: 09/17/2019    Years since quitting: 3.0   Smokeless tobacco: Never  Vaping Use   Vaping Use: Never used  Substance and Sexual Activity   Alcohol use: No    Alcohol/week: 0.0 standard drinks of alcohol   Drug use: No   Sexual activity: Not Currently    Birth control/protection: Post-menopausal  Other Topics Concern   Not on file  Social History Narrative   Current Social History 02/25/2021        Patient  lives with family in a home which is 2 stories. There are steps up to the entrance the patient uses with handrails.       Patient's method of transportation is personal car.      The highest level of education was some high school.      The patient currently disabled.      Identified important Relationships are "Family, my daughter and grandkids"       Pets : My dog       Interests / Fun: "sleeping, working in my flowerbed        Current Stressors: The war in [place]        Social Determinants of Health   Financial Resource Strain: Low Risk  (10/13/2022)   Overall Financial Resource Strain (CARDIA)    Difficulty of Paying Living Expenses: Not very hard  Food Insecurity: No Food Insecurity (10/13/2022)   Hunger Vital Sign    Worried About Running Out of Food in the Last Year: Never true    Ran Out of Food in the Last Year: Never true  Transportation Needs: No Transportation Needs (10/13/2022)   PRAPARE - Hydrologist (Medical): No    Lack of Transportation (Non-Medical): No  Physical Activity: Insufficiently Active (09/28/2022)   Exercise Vital Sign    Days of Exercise per Week: 1 day    Minutes of Exercise per Session: 10 min  Stress: No Stress Concern Present (09/28/2022)   [place]    Feeling of Stress : Not at all  Social Connections: Socially Isolated (09/28/2022)   Social Connection and Isolation Panel [NHANES]    Frequency of Communication with Friends and Family: More than three times a week    Frequency of Social Gatherings with Friends and Family: Never    Attends Religious Services: Never    Marine scientist or Organizations: No    Attends Archivist Meetings: Never    Marital Status: Widowed   Education Assessment and Provision:  Detailed education and instructions provided on heart failure disease management including the following:  Signs and symptoms of Heart Failure When to call the physician Importance of daily weights Low sodium diet Fluid restriction Medication management Anticipated future follow-up appointments  Patient education given on each of the above topics.  Patient acknowledges understanding via teach back method and acceptance of all instructions.  Education Materials:  "Living Better With Heart Failure" Booklet, HF zone tool, & Daily Weight Tracker Tool.  Patient has scale at home: yes Patient has  pill box at home: NA    High Risk Criteria for Readmission and/or Poor Patient Outcomes: Heart failure hospital admissions (last 6 months): 1  No Show rate: 5 %  Difficult social situation: No Demonstrates medication adherence: Yes Primary Language: English Literacy level: reading, writing, and comprehension  Barriers of Care:   Diet/ fluid restrictions  Daily weights  Considerations/Referrals:   Referral made to Heart Failure Pharmacist Stewardship: Yes Referral made to Heart Failure CSW/NCM TOC: No Referral made to Heart & Vascular TOC clinic: Yes, 10/28/22  Items for Follow-up on DC/TOC: Diet/ fluid restrictions  Daily weights   Earnestine Leys, BSN, RN Heart Failure Transport planner Only

## 2022-10-13 NOTE — Progress Notes (Signed)
HD#3 Subjective:   Summary: Alicia [name] is a 66 year old female with PMH  of COPD,on 4L [place] combined systolic and diastolic heart failure LVEF 60-65% (10/12/22), CAD, HLD, GERD, T2DM, and CVA who presents for 1 week of worsening dyspnea on exertion admitted for CHF and COPD exacerbation.  Overnight Events: No overnight events  Patient reports her breathing has improved.  She does complain of mild difficulty breathing when laying on supine.  She denies orthopnea. She also complains of mild chest pain on and off.  She denies nausea, vomiting, diaphoresis, palpitations, dizziness.  Objective:  Vital signs in last 24 hours: Vitals:   10/12/22 2100 10/13/22 0455 10/13/22 0838 10/13/22 0907  BP:  (!) 156/65 (!) 160/68   Pulse:  (!) 57 63   Resp:   18   Temp:  98.4 F (36.9 C) 98.2 F (36.8 C)   TempSrc:  Oral Oral   SpO2: 94% 92% 98% 94%  Weight:  99.1 kg     Supplemental O2: Nasal Cannula SpO2: 94 % O2 Flow Rate (L/min): 4 L/min   Physical Exam:  Constitutional: well-appearing female, laying in bed, in no acute distress HENT: normocephalic atraumatic, mucous membranes moist Eyes: conjunctiva non-erythematous Neck: supple Cardiovascular: regular rate and rhythm, no m/r/g Pulmonary/Chest: Mild bibasilar coarse crackles.  No [place] limitation, wheezing or rhonchi appreciated. Abdominal: soft, non-tender, non-distended MSK: No pedal edema.   Neurological: alert & oriented x 3 Skin: warm and dry Psych: Normal mood and affect.  Filed Weights   10/12/22 1100 10/13/22 0455  Weight: 98.1 kg 99.1 kg     Intake/Output Summary (Last 24 hours) at 10/13/2022 1153 Last data filed at 10/13/2022 0944 Gross per 24 hour  Intake 240 ml  Output 3450 ml  Net -3210 ml   Net IO Since Admission: -4,135 mL [10/13/22 1153]  Pertinent Labs:    Latest Ref Rng & Units 10/13/2022    1:55 AM 10/12/2022    2:27 AM 10/11/2022    5:22 AM  CBC  WBC 4.0 - 10.5 K/uL 11.6  13.2     Hemoglobin 12.0 - 15.0 g/dL 10.5  9.6  10.9   Hematocrit 36.0 - 46.0 % 33.3  32.5  32.0   Platelets 150 - 400 K/uL 273  260         Latest Ref Rng & Units 10/13/2022    1:55 AM 10/12/2022    2:27 AM 10/11/2022    5:22 AM  CMP  Glucose 70 - 99 mg/dL 198  194    BUN 8 - 23 mg/dL 25  21    Creatinine 0.44 - 1.00 mg/dL 0.92  1.01    Sodium 135 - 145 mmol/L 138  139  139   Potassium 3.5 - 5.1 mmol/L 3.7  3.5  3.2   Chloride 98 - 111 mmol/L 92  96    CO2 22 - 32 mmol/L 36  31    Calcium 8.9 - 10.3 mg/dL 8.5  8.2      Imaging: ECHOCARDIOGRAM COMPLETE  Result Date: 10/12/2022    ECHOCARDIOGRAM REPORT   Patient Name:   Alicia [name] Date of Exam: 10/12/2022 Medical Rec #:  [id number]       Height:       67.0 in Accession #:    [id number]      Weight:       216.3 lb Date of Birth:  10-Jan-1956        BSA:  2.091 m Patient Age:    84 years        BP:           132/80 mmHg Patient Gender: F               HR:           59 bpm. Exam Location:  Inpatient Procedure: 2D Echo, Cardiac Doppler and Color Doppler Indications:    CHF-Acute Systolic I09.73  History:        Patient has prior history of Echocardiogram examinations, most                 recent 07/02/2022. COPD; Risk Factors:Hypertension, Diabetes and                 Dyslipidemia. Morbid Obesity.  Sonographer:    Alvino Chapel RCS Referring Phys: Portal  1. Left ventricular ejection fraction, by estimation, is 55 to 60%. Left ventricular ejection fraction by PLAX is 56 %. The left ventricle has normal function. The left ventricle has no regional wall motion abnormalities. The left ventricular internal cavity size was moderately dilated. There is mild concentric left ventricular hypertrophy. Left ventricular diastolic parameters are consistent with Grade II diastolic dysfunction (pseudonormalization).  2. Right ventricular systolic function is normal. The right ventricular size is normal. There is mildly elevated  pulmonary artery systolic pressure.  3. Left atrial size was severely dilated.  4. Right atrial size was moderately dilated.  5. The mitral valve is normal in structure. Trivial mitral valve regurgitation. No evidence of mitral stenosis.  6. The aortic valve is tricuspid. Aortic valve regurgitation is not visualized. No aortic stenosis is present.  7. The inferior vena cava is normal in size with greater than 50% respiratory variability, suggesting right atrial pressure of 3 mmHg. FINDINGS  Left Ventricle: Left ventricular ejection fraction, by estimation, is 55 to 60%. Left ventricular ejection fraction by PLAX is 56 %. The left ventricle has normal function. The left ventricle has no regional wall motion abnormalities. Definity contrast agent was given IV to delineate the left ventricular endocardial borders. The left ventricular internal cavity size was moderately dilated. There is mild concentric left ventricular hypertrophy. Left ventricular diastolic parameters are consistent with Grade II diastolic dysfunction (pseudonormalization). Right Ventricle: The right ventricular size is normal. No increase in right ventricular wall thickness. Right ventricular systolic function is normal. There is mildly elevated pulmonary artery systolic pressure. The tricuspid regurgitant velocity is 2.32  m/s, and with an assumed right atrial pressure of 15 mmHg, the estimated right ventricular systolic pressure is 53.2 mmHg. Left Atrium: Left atrial size was severely dilated. Right Atrium: Right atrial size was moderately dilated. Pericardium: There is no evidence of pericardial effusion. Mitral Valve: The mitral valve is normal in structure. Trivial mitral valve regurgitation. No evidence of mitral valve stenosis. Tricuspid Valve: The tricuspid valve is normal in structure. Tricuspid valve regurgitation is trivial. No evidence of tricuspid stenosis. Aortic Valve: The aortic valve is tricuspid. Aortic valve regurgitation is not  visualized. No aortic stenosis is present. Pulmonic Valve: The pulmonic valve was normal in structure. Pulmonic valve regurgitation is not visualized. No evidence of pulmonic stenosis. Aorta: The aortic root is normal in size and structure. Venous: The inferior vena cava is normal in size with greater than 50% respiratory variability, suggesting right atrial pressure of 3 mmHg. IAS/Shunts: No atrial level shunt detected by color flow Doppler.  LEFT VENTRICLE PLAX 2D LV EF:  Left            Diastology                ventricular     LV e' medial:    4.57 cm/s                ejection        LV E/e' medial:  30.2                fraction by     LV e' lateral:   4.79 cm/s                PLAX is 56      LV E/e' lateral: 28.8                %. LVIDd:         6.70 cm LVIDs:         4.70 cm LV PW:         1.30 cm LV IVS:        1.30 cm LVOT diam:     2.00 cm LV SV:         74 LV SV Index:   35 LVOT Area:     3.14 cm  RIGHT VENTRICLE RV S prime:     16.40 cm/s TAPSE (M-mode): 2.8 cm LEFT ATRIUM             Index        RIGHT ATRIUM           Index LA diam:        4.70 cm 2.25 cm/m   RA Area:     22.50 cm LA Vol (A2C):   85.7 ml 40.99 ml/m  RA Volume:   75.55 ml  36.14 ml/m LA Vol (A4C):   95.2 ml 45.54 ml/m LA Biplane Vol: 92.0 ml 44.01 ml/m  AORTIC VALVE LVOT Vmax:   100.00 cm/s LVOT Vmean:  64.200 cm/s LVOT VTI:    0.236 m  AORTA Ao Root diam: 3.20 cm MITRAL VALVE                  TRICUSPID VALVE MV Area (PHT): 3.87 cm       TR Peak grad:   21.5 mmHg MV Decel Time: 196 msec       TR Vmax:        232.00 cm/s MR Peak grad:    107.7 mmHg MR Mean grad:    72.0 mmHg    SHUNTS MR Vmax:         519.00 cm/s  Systemic VTI:  0.24 m MR Vmean:        401.0 cm/s   Systemic Diam: 2.00 cm MR PISA:         2.26 cm MR PISA Eff ROA: 10 mm MR PISA Radius:  0.60 cm MV E velocity: 138.00 cm/s MV A velocity: 73.80 cm/s MV E/A ratio:  1.87 Skeet Latch MD Electronically signed by Skeet Latch MD Signature Date/Time:  10/12/2022/5:26:58 PM    Final     Assessment/Plan:   Principal Problem:   Acute exacerbation of CHF (congestive heart failure) (HCC) Active Problems:   COPD exacerbation (HCC)   Type 2 diabetes mellitus with other specified complication [facility])   Patient Summary: Assessment & Plan by Problem: Principal Problem:   Acute exacerbation of CHF (congestive heart failure) (HCC)  Pt presented with worsening dyspnea on exertion with increasing oxygen requirement.  Patient  diuresed with IV Lasix 80 mg twice daily.  Output for 24 hours 3210 mL.  Pedal edema has completely resolved however coarse bibasilar crackles appreciated to auscultation.  Patient reports her breathing has improved however she still has slight trouble breathing when laying on supine.  Patient denies orthopnea.   Echo done on November 2023 showed ejection fraction of 55 to 60% which has improved from 45 to 50% (06/2022). Patient's presentation consistent with acute CHF exacerbation secondary to volume overload.  No acute ischemic changes or arrhythmias on EKG. we will get echo to determine left ventricle function.  Will give 1 dose of Lasix 80 mg IV and reassess the patient.  We will also repeat BMP to check for serum creatinine and potassium.  Patient is potential for discharge given reevaluation after the IV Lasix and review of BMP results.Overall, after the physical exam and lab review she is stable for discharge. Plan: -Potential for discharge after one-time dose of IV Lasix 80 mg and review of BMP and patient assessment for stability for discharge. -Continue home Entresto 97 100 mg twice daily, Farxiga 5 mg daily.  Not on beta-blocker due to severe COPD. -Monitor electrolytes, I's and O's, weights -Monitor telemetry   COPD exacerbation Pt's breathing has improved.Trace Bibasilar course crackles likely from volume overload. She denies Orthopnea, mild discomfort with breathing when laying supine.  No cough noted during the exam. We  will continue to diurese.  Chronically on 4 L at home -DuoNebs every 4 -Prednisone 40 mg daily - Will need outpatient PFTs   CAD HLD History of nonobstructive CAD. Troponin 19>17. EKG in NSR. Suspect CP in setting of COPD and CHF exacerbation -Continue ASA 81 mg, atorvastatin 80 mg and Zetia 10 mg daily   T2DM A1c 7.7% on 08/17/2022. Pt's blood glucose running around 190. Pt getting 5 units novolog TID, increased to 10 units 3 times daily for adequate blood sugar control.  Will increase short acting insulin. Home medications include faxiga, trulicity 1.5 mg weekly, lantus 40 units nightly. -Semglee 40 units daily -Novolog 10 units TID - SSI - CBG monitoring   Constipation Last BM 5 days ago. Senna 2 tablets added to treatment regimen. -BM regular  Diet: Heart Healthy IVF: None,None VTE:  Lovenox  Code: Full   Dispo: Anticipated discharge to Home possibly today, pending reevaluation after Lasix IV 80 mg and repeat BMP.  Teola Bradley, MD Internal Medicine Resident PGY-1 Pager: [phone] Please contact the on call pager after 5 pm and on weekends at [phone].

## 2022-10-13 NOTE — Evaluation (Signed)
Occupational Therapy Evaluation Patient Details Name: Alicia [name] MRN: [id number] DOB: [birth year] Today's Date: 10/13/2022   History of Present Illness Pt is a 66 y.o. female admitted 10/11/22 with worsening SOB and cough. Workup for CHF, COPD exacerbation. PMH includes CHF, COPD (4-5L O2 baseline), DM2, CVA.   Clinical Impression   Pt reports independence at baseline with ADLs and mobility, lives with daughter and grandchildren who can assist at d/c. Pt currently needing mod I -mod A for ADLs, mod I for transfers with RW. Pt SpO2 dropping to 73% on 4L O2, needing seated rest break and ~3 min before coming up to low 90's. Pt reports frequently checking her SpO2 at home and adjusting as needed. Pt presenting with impairments listed below, will follow acutely. Recommend d/c home with family assistance.      Recommendations for follow up therapy are one component of a multi-disciplinary discharge planning process, led by the attending physician.  Recommendations may be updated based on patient status, additional functional criteria and insurance authorization.   Follow Up Recommendations  No OT follow up     Assistance Recommended at Discharge PRN  Patient can return home with the following A little help with walking and/or transfers;A little help with bathing/dressing/bathroom;Assistance with cooking/housework;Assist for transportation;Help with stairs or ramp for entrance    Functional Status Assessment  Patient has had a recent decline in their functional status and demonstrates the ability to make significant improvements in function in a reasonable and predictable amount of time.  Equipment Recommendations  BSC/3in1;Tub/shower bench    Recommendations for Other Services PT consult     Precautions / Restrictions Precautions Precautions: Fall;Other (comment) Precaution Comments: watch SpO2 (wears 4-5L O2 baseline, reports SpO2 drops frequently at home with baseline numbers down  to 70s-80s%) Restrictions Weight Bearing Restrictions: No      Mobility Bed Mobility               General bed mobility comments: up in chair upon arrival and departure    Transfers Overall transfer level: Modified independent Equipment used: Rolling walker (2 wheels)                      Balance Overall balance assessment: Needs assistance, Modified Independent   Sitting balance-Leahy Scale: Good Sitting balance - Comments: pt opting to sit to finish doffing underwear   Standing balance support: No upper extremity supported, Bilateral upper extremity supported, During functional activity Standing balance-Leahy Scale: Fair Standing balance comment: can static stand and take steps withtout UE support, preference for walker, endorses fear of falling                           ADL either performed or assessed with clinical judgement   ADL Overall ADL's : Needs assistance/impaired Eating/Feeding: Modified independent   Grooming: Modified independent   Upper Body Bathing: Minimal assistance Upper Body Bathing Details (indicate cue type and reason): donning gown Lower Body Bathing: Moderate assistance   Upper Body Dressing : Minimal assistance   Lower Body Dressing: Moderate assistance   Toilet Transfer: Min guard;Rolling walker (2 wheels);Ambulation;Regular Museum/gallery exhibitions officer and Hygiene: Supervision/safety Toileting - Clothing Manipulation Details (indicate cue type and reason): pericare     Functional mobility during ADLs: Min guard;Rolling walker (2 wheels)       Vision   Vision Assessment?: No apparent visual deficits     Perception Perception Perception Tested?: No  Praxis Praxis Praxis tested?: Not tested    Pertinent Vitals/Pain Pain Assessment Pain Assessment: No/denies pain     Hand Dominance     Extremity/Trunk Assessment Upper Extremity Assessment Upper Extremity Assessment: Overall WFL for  tasks assessed   Lower Extremity Assessment Lower Extremity Assessment: Overall WFL for tasks assessed   Cervical / Trunk Assessment Cervical / Trunk Assessment: Normal   Communication Communication Communication: HOH   Cognition Arousal/Alertness: Awake/alert Behavior During Therapy: WFL for tasks assessed/performed Overall Cognitive Status: Within Functional Limits for tasks assessed                                 General Comments: WFL for simple tasks, not formally assessed. suspect baseline     General Comments  SpO2 dropping to 73% at lowest on 4L, increased time (~41mn) to return to >88%    Exercises     Shoulder Instructions      Home Living Family/patient expects to be discharged to:: Private residence Living Arrangements: Children (daughter and grandkids) Available Help at Discharge: Family;Available PRN/intermittently Type of Home: House Home Access: Level entry     Home Layout: Two level;Bed/bath upstairs Alternate Level Stairs-Number of Steps: flight Alternate Level Stairs-Rails: Right Bathroom Shower/Tub: Tub/shower unit         Home Equipment: Cane - single point   Additional Comments: lives with daughter who works, 165and 150y.o. grandkids who go to school      Prior Functioning/Environment Prior Level of Function : Independent/Modified Independent;Driving             Mobility Comments: Typically mod indep with intermittent use of SPC. limited ambulator due to SOB, uses scooter at stores, "crawls" up steps if necessary due to SOB; checks SpO2 daily at home. drives ADLs Comments: indep with ADLs (sits for dressing due to fear of falling); daughter does majority of cooking, ind with med mgmt        OT Problem List: Decreased strength;Decreased range of motion;Decreased activity tolerance;Impaired balance (sitting and/or standing);Decreased safety awareness;Cardiopulmonary status limiting activity      OT  Treatment/Interventions: Self-care/ADL training;Therapeutic exercise;Energy conservation;DME and/or AE instruction;Therapeutic activities;Patient/family education;Balance training    OT Goals(Current goals can be found in the care plan section) Acute Rehab OT Goals Patient Stated Goal: none stated OT Goal Formulation: With patient Time For Goal Achievement: 10/27/22 Potential to Achieve Goals: Good ADL Goals Additional ADL Goal #1: pt will verbalize 3 energy conservation strategies in order to improve ind with ADLs Additional ADL Goal #2: Pt will be able to complete standing task x5 min with SpO2 above 88% in order to improve activity tolerance for ADLs  OT Frequency: Min 2X/week    Co-evaluation              AM-PAC OT "6 Clicks" Daily Activity     Outcome Measure Help from another person eating meals?: None Help from another person taking care of personal grooming?: A Little Help from another person toileting, which includes using toliet, bedpan, or urinal?: A Little Help from another person bathing (including washing, rinsing, drying)?: A Lot Help from another person to put on and taking off regular upper body clothing?: A Little Help from another person to put on and taking off regular lower body clothing?: A Lot 6 Click Score: 17   End of Session Equipment Utilized During Treatment: Gait belt;Rolling walker (2 wheels);Oxygen Nurse Communication: Mobility status  Activity Tolerance:  Patient tolerated treatment well Patient left: in chair;with call bell/phone within reach;with chair alarm set  OT Visit Diagnosis: Unsteadiness on feet (R26.81);Other abnormalities of gait and mobility (R26.89);Muscle weakness (generalized) (M62.81)                Time: 7395-8441 OT Time Calculation (min): 30 min Charges:  OT General Charges $OT Visit: 1 Visit OT Evaluation $OT Eval Moderate Complexity: 1 Mod OT Treatments $Self Care/Home Management : 8-22 mins  Renaye Rakers, OTD,  OTR/L SecureChat Preferred Acute Rehab [phone]  Renaye Rakers [place] 10/13/2022, 1:18 PM

## 2022-10-14 ENCOUNTER — Telehealth: Payer: Self-pay

## 2022-10-14 ENCOUNTER — Other Ambulatory Visit: Payer: Self-pay | Admitting: Internal Medicine

## 2022-10-14 ENCOUNTER — Other Ambulatory Visit (HOSPITAL_COMMUNITY): Payer: Self-pay

## 2022-10-14 DIAGNOSIS — R5381 Other malaise: Secondary | ICD-10-CM

## 2022-10-14 DIAGNOSIS — I5042 Chronic combined systolic (congestive) and diastolic (congestive) heart failure: Secondary | ICD-10-CM

## 2022-10-14 NOTE — Patient Outreach (Signed)
  Care Coordination TOC Note Transition Care Management Follow-up Telephone Call Date of discharge and from where: [facility] 10/11/22-10/13/22 How have you been since you were released from the hospital? "I am feeling okay, a little weak but good". Any questions or concerns? Yes-The physical therapist said I would be getting a walker (rollator) and a 3 in 1 commode but I did not get discharged until 0900 PM and I never received my supplies.  This Probation officer reviewed chart and message sent to residents "Red Team" and [facility] triage nurse team to obtain orders.  Patient is aware that with the holiday, she may not receive for a little while.  Items Reviewed: Did the pt receive and understand the discharge instructions provided? Yes  Medications obtained and verified? Yes  Other? Yes  Any new allergies since your discharge? No  Dietary orders reviewed? No Do you have support at home? Yes   Home Care and Equipment/Supplies: Were home health services ordered? no If so, what is the name of the agency? N/a  Has the agency set up a time to come to the patient's home? not applicable Were any new equipment or medical supplies ordered?  No What is the name of the medical supply agency? Need to be ordered Were you able to get the supplies/equipment? not applicable Do you have any questions related to the use of the equipment or supplies? No  Functional Questionnaire: (I = Independent and D = Dependent) ADLs: I  Bathing/Dressing- I  Meal Prep- I  Eating- I  Maintaining continence- I  Transferring/Ambulation- I  Managing Meds- I  Follow up appointments reviewed:  PCP Hospital f/u appt confirmed? No   Specialist Hospital f/u appt confirmed? Yes  Scheduled to see [facility] on 10/28/22 @ 10:00. Are transportation arrangements needed? No  If their condition worsens, is the pt aware to call PCP or go to the Emergency Dept.? Yes Was the patient provided with contact information for the PCP's  office or ED? Yes Was to pt encouraged to call back with questions or concerns? Yes  SDOH assessments and interventions completed:   Yes  Care Coordination Interventions Activated:  Yes   Care Coordination Interventions:  PCP follow up appointment requested   Encounter Outcome:  Pt. Visit Completed

## 2022-10-14 NOTE — Patient Outreach (Signed)
  Care Coordination   Initial Visit Note   10/14/2022 Name: Alicia [name] MRN: [id number] DOB: 08-12-1956  Alicia [name] is a 66 y.o. year old female who sees Evette Doffing, Mallie Mussel, MD for primary care. I spoke with  Alicia [name] by phone today.  What matters to the patients health and wellness today?  Obtaining my Rolator and 3 in 1 commode.    Goals Addressed               This Visit's Progress     COMPLETED: "I was suppose to get a walker (rollator) and a 3 in 1 commode" (pt-stated)        Care Coordination Interventions: Evaluation of current treatment plan related to her DME needs and patient's adherence to plan as established by provider Assessed social determinant of health barriers Verified order in Epic for DME and faxed to [facility] via secure fax (Epic).  Called [employer] and spoke with [place] who verified patient information and they will contact patient to set up delivery.  Called patient back and let her know that [employer] will contact her.  Advised if she does not hear from them by Monday to call [facility] clinic.          SDOH assessments and interventions completed:  Yes  SDOH Interventions Today    Flowsheet Row Most Recent Value  SDOH Interventions   Food Insecurity Interventions Intervention Not Indicated  Financial Strain Interventions Intervention Not Indicated        Care Coordination Interventions Activated:  Yes  Care Coordination Interventions:  Yes, provided   Follow up plan: No further intervention required.   Encounter Outcome:  Pt. Visit Completed

## 2022-10-19 DIAGNOSIS — J9611 Chronic respiratory failure with hypoxia: Secondary | ICD-10-CM | POA: Diagnosis not present

## 2022-10-20 ENCOUNTER — Encounter (INDEPENDENT_AMBULATORY_CARE_PROVIDER_SITE_OTHER): Payer: Medicare Other | Admitting: Ophthalmology

## 2022-10-20 DIAGNOSIS — I10 Essential (primary) hypertension: Secondary | ICD-10-CM | POA: Diagnosis not present

## 2022-10-20 DIAGNOSIS — H34832 Tributary (branch) retinal vein occlusion, left eye, with macular edema: Secondary | ICD-10-CM

## 2022-10-20 DIAGNOSIS — H43813 Vitreous degeneration, bilateral: Secondary | ICD-10-CM | POA: Diagnosis not present

## 2022-10-20 DIAGNOSIS — H35033 Hypertensive retinopathy, bilateral: Secondary | ICD-10-CM | POA: Diagnosis not present

## 2022-10-26 ENCOUNTER — Other Ambulatory Visit: Payer: Self-pay | Admitting: Student in an Organized Health Care Education/Training Program

## 2022-10-28 ENCOUNTER — Ambulatory Visit (HOSPITAL_COMMUNITY)
Admit: 2022-10-28 | Discharge: 2022-10-28 | Disposition: A | Discharge status: Home / Self Care | Payer: Medicare Other | Source: Ambulatory Visit | Attending: Cardiology | Admitting: Cardiology

## 2022-10-28 ENCOUNTER — Telehealth (HOSPITAL_COMMUNITY): Payer: Self-pay

## 2022-10-28 ENCOUNTER — Encounter (HOSPITAL_COMMUNITY): Payer: Self-pay

## 2022-10-28 VITALS — BP 150/80 | HR 65 | Ht 68.0 in | Wt 218.0 lb

## 2022-10-28 DIAGNOSIS — E119 Type 2 diabetes mellitus without complications: Secondary | ICD-10-CM | POA: Insufficient documentation

## 2022-10-28 DIAGNOSIS — I2510 Atherosclerotic heart disease of native coronary artery without angina pectoris: Secondary | ICD-10-CM | POA: Diagnosis not present

## 2022-10-28 DIAGNOSIS — Z7985 Long-term (current) use of injectable non-insulin antidiabetic drugs: Secondary | ICD-10-CM | POA: Insufficient documentation

## 2022-10-28 DIAGNOSIS — Z7984 Long term (current) use of oral hypoglycemic drugs: Secondary | ICD-10-CM | POA: Diagnosis not present

## 2022-10-28 DIAGNOSIS — I5032 Chronic diastolic (congestive) heart failure: Secondary | ICD-10-CM | POA: Diagnosis not present

## 2022-10-28 DIAGNOSIS — Z794 Long term (current) use of insulin: Secondary | ICD-10-CM | POA: Insufficient documentation

## 2022-10-28 DIAGNOSIS — Z79899 Other long term (current) drug therapy: Secondary | ICD-10-CM | POA: Diagnosis not present

## 2022-10-28 DIAGNOSIS — I10 Essential (primary) hypertension: Secondary | ICD-10-CM

## 2022-10-28 DIAGNOSIS — E785 Hyperlipidemia, unspecified: Secondary | ICD-10-CM | POA: Diagnosis not present

## 2022-10-28 DIAGNOSIS — Z9981 Dependence on supplemental oxygen: Secondary | ICD-10-CM | POA: Diagnosis not present

## 2022-10-28 DIAGNOSIS — I110 Hypertensive heart disease with heart failure: Secondary | ICD-10-CM | POA: Insufficient documentation

## 2022-10-28 DIAGNOSIS — J449 Chronic obstructive pulmonary disease, unspecified: Secondary | ICD-10-CM | POA: Insufficient documentation

## 2022-10-28 LAB — BASIC METABOLIC PANEL
Anion gap: 9 (ref 5–15)
BUN: 15 mg/dL (ref 8–23)
CO2: 32 mmol/L (ref 22–32)
Calcium: 9 mg/dL (ref 8.9–10.3)
Chloride: 96 mmol/L — ABNORMAL LOW (ref 98–111)
Creatinine, Ser: 0.81 mg/dL (ref 0.44–1.00)
GFR, Estimated: >60 mL/min (ref >60)
Glucose, Bld: 195 mg/dL — ABNORMAL HIGH (ref 70–99)
Potassium: 3.9 mmol/L (ref 3.5–5.1)
Sodium: 137 mmol/L (ref 135–145)

## 2022-10-28 MED ORDER — DAPAGLIFLOZIN PROPANEDIOL 10 MG PO TABS: 10.0000 mg | ORAL_TABLET | Freq: Every day | ORAL | 3 refills | Status: DC

## 2022-10-28 MED ORDER — SPIRONOLACTONE 25 MG PO TABS: 12.5000 mg | ORAL_TABLET | Freq: Every day | ORAL | 3 refills | Status: DC

## 2022-10-28 NOTE — Progress Notes (Signed)
HEART & VASCULAR TRANSITION OF CARE CONSULT NOTE     Referring Physician: Primary Care: Axel Filler, MD  Primary Cardiologist: Candee Furbish, MD   HPI: Referred to clinic by Dr. Evette Doffing w/ Internal Medicine for heart failure consultation.   Alicia [name] is a 66 y.o. female w/ h/o systolic/diastolic heart failure with EF originally 30% in 2014 improved to 60% in 2020. Also w/ severe COPD on home O2, HTN and HLD. Followed by Dr. Marlou Porch.   Recently admitted w/ acute hypoxic respiratory failure due to CHF w/ volume overload and COPDE. Diuresed w/ IV Lasix and treated w/ abx, steroids and nebs. Echo showed normal EF 55-60% and normal RV. After diureses, was transitioned to PO Lasix. Also placed on [place]. D/c Wt 218 lb. Referred to [facility] clinic.   Presents today for f/u. Reports feeling better. On 4L [place], which is usual baseline. NYHA Class II-III c/w prior baseline. Reports full med compliance. BP elevated 150/80. ReDs 36%.   Cardiac Testing   2D Echo 10/12/22 Left ventricular ejection fraction, by estimation, is 55 to 60%. Left ventricular ejection fraction by PLAX is 56 %. The left ventricle has normal function. The left ventricle has no regional wall motion abnormalities. The left ventricular internal cavity size was moderately dilated. There is mild concentric left ventricular hypertrophy. Left ventricular diastolic parameters are consistent with Grade II diastolic dysfunction (pseudonormalization). 1. Right ventricular systolic function is normal. The right ventricular size is normal. There is mildly elevated pulmonary artery systolic pressure. 2. 3. Left atrial size was severely dilated. 4. Right atrial size was moderately dilated. The mitral valve is normal in structure. Trivial mitral valve regurgitation. No evidence of mitral stenosis. 5. The aortic valve is tricuspid. Aortic valve regurgitation is not visualized. No aortic stenosis is  present. 6. The inferior vena cava is normal in size with greater than 50% respiratory variability, suggesting right atrial pressure of 3 mmHg.   Review of Systems: [y] = yes, '[ ]'$  = no   General: Weight gain '[ ]'$ ; Weight loss '[ ]'$ ; Anorexia '[ ]'$ ; Fatigue '[ ]'$ ; Fever '[ ]'$ ; Chills '[ ]'$ ; Weakness '[ ]'$   Cardiac: Chest pain/pressure '[ ]'$ ; Resting SOB '[ ]'$ ; Exertional SOB '[ ]'$ ; Orthopnea '[ ]'$ ; Pedal Edema '[ ]'$ ; Palpitations '[ ]'$ ; Syncope '[ ]'$ ; Presyncope '[ ]'$ ; Paroxysmal nocturnal dyspnea'[ ]'$   Pulmonary: Cough '[ ]'$ ; Wheezing'[ ]'$ ; Hemoptysis'[ ]'$ ; Sputum '[ ]'$ ; Snoring '[ ]'$   GI: Vomiting'[ ]'$ ; Dysphagia'[ ]'$ ; Melena'[ ]'$ ; Hematochezia '[ ]'$ ; Heartburn'[ ]'$ ; Abdominal pain '[ ]'$ ; Constipation '[ ]'$ ; Diarrhea '[ ]'$ ; BRBPR '[ ]'$   GU: Hematuria'[ ]'$ ; Dysuria '[ ]'$ ; Nocturia'[ ]'$   Vascular: Pain in legs with walking '[ ]'$ ; Pain in feet with lying flat '[ ]'$ ; Non-healing sores '[ ]'$ ; Stroke '[ ]'$ ; TIA '[ ]'$ ; Slurred speech '[ ]'$ ;  Neuro: Headaches'[ ]'$ ; Vertigo'[ ]'$ ; Seizures'[ ]'$ ; Paresthesias'[ ]'$ ;Blurred vision '[ ]'$ ; Diplopia '[ ]'$ ; Vision changes '[ ]'$   Ortho/Skin: Arthritis '[ ]'$ ; Joint pain '[ ]'$ ; Muscle pain '[ ]'$ ; Joint swelling '[ ]'$ ; Back Pain '[ ]'$ ; Rash '[ ]'$   Psych: Depression'[ ]'$ ; Anxiety'[ ]'$   Heme: Bleeding problems '[ ]'$ ; Clotting disorders '[ ]'$ ; Anemia '[ ]'$   Endocrine: Diabetes '[ ]'$ ; Thyroid dysfunction'[ ]'$    Past Medical History:  Diagnosis Date   Abscess of skin of abdomen 08/31/2018   Acquired lactose intolerance 09/24/2017   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  Non-functioning   Aortic atherosclerosis ([place]) 09/24/2017   Asymptomatic, found on CT scan   Blood transfusion without reported diagnosis    Chronic Systolic Heart Failure 03/17/9562   Felt to be non-ischemic and secondary to hypertension.  Echo (05/28/2014): LVEF 25%.  Is not interested in AICD placement.   COPD exacerbation ([place])    Coronary artery disease involving native coronary artery of native heart without angina pectoris 11/09/2014   Cardiac cath  (02/18/2014): Non-obstructive, mRCA 30%, dRCA 60%   Cystocele with uterine prolapse - grade 3 02/12/2016   Not interested in pessary after trying   Diverticulosis of colon 09/24/2017   Diverticulosis of colon 09/24/2017   Essential hypertension 10/15/2013   Gastroesophageal reflux disease 09/24/2017   History of cerebrovascular accident 05/10/2013   Per patient report 6 previous strokes, most recent on 05/10/13.  No residual deficits.   History of cerebrovascular accident 05/10/2013   Per patient report 6 previous strokes, most recent on 05/10/13.  No residual deficits.   Hyperlipidemia    Overweight (BMI 25.0-29.9) 09/24/2017   Psoriasis 09/24/2017   Seasonal allergic rhinitis due to pollen 09/24/2017   Spring and early Fall   Small Bowel Obstruction (SBO) 01/13/2014   Ex-Lap & Lysis of Adhesion, 01/15/2014   Thyroid nodule 09/04/2019   Tobacco use disorder 01/15/2014   Type 2 diabetes mellitus with moderate nonproliferative diabetic retinopathy ([place]) 10/15/2013    Current Outpatient Medications  Medication Sig Dispense Refill   ACCU-CHEK AVIVA PLUS test strip USE TO TEST BLOOD SUGARS AS DIRECTED 100 strip 12   albuterol (VENTOLIN HFA) 108 (90 Base) MCG/ACT inhaler Inhale 2 puffs into the lungs every 6 (six) hours as needed. (Patient taking differently: Inhale 2 puffs into the lungs every 6 (six) hours as needed for wheezing or shortness of breath.) 18 g 5   amLODipine (NORVASC) 10 MG tablet TAKE 1 TABLET BY MOUTH EVERY DAY (Patient taking differently: Take 10 mg by mouth daily.) 90 tablet 3   aspirin 81 MG tablet Take 81 mg by mouth daily.     atorvastatin (LIPITOR) 80 MG tablet Take 1 tablet (80 mg total) by mouth daily. 90 tablet 3   B-D UF III MINI PEN NEEDLES 31G X 5 MM MISC USE 3 TIMES A DAY WITH HUMALOG 100 each 5   ENTRESTO 97-103 MG TAKE 1 TABLET BY MOUTH TWICE A DAY (Patient taking differently: Take 1 tablet by mouth 2 (two) times daily.) 180 tablet 3   ezetimibe (ZETIA) 10 MG tablet  Take 1 tablet (10 mg total) by mouth daily. 90 tablet 3   furosemide (LASIX) 40 MG tablet Take 2 tablets (80 mg total) by mouth daily. 180 tablet 3   insulin glargine-yfgn (SEMGLEE) 100 UNIT/ML injection Inject 0.4 mLs (40 Units total) into the skin at bedtime. 10 mL 11   insulin lispro (HUMALOG) 100 UNIT/ML KwikPen Inject 10 Units into the skin 3 (three) times daily with meals. 15 mL 11   Insulin Pen Needle 32G X 4 MM MISC Use to inject insulin 4 times a day. The patient is insulin requiring, ICD 10 code 11.10. The patient injects 4 times per day. 400 each 3   Insulin Syringe-Needle U-100 31G X 15/64" 0.3 ML MISC Use to inject Humalog before meals three times a day 100 each 5   Lancets (ACCU-CHEK MULTICLIX) lancets Use to check your blood sugar four times daily: early morning, before a meal, two hours after a meal, and bedtime 100 each 12  nystatin powder Apply 1 Application topically 3 (three) times daily. (Patient taking differently: Apply 1 Application topically 2 (two) times daily.) 15 g 2   PATADAY 0.1 % ophthalmic solution Place 1 drop into both eyes 2 (two) times daily. (Patient taking differently: Place 1 drop into both eyes daily as needed for allergies.) 5 mL 1   senna (SENOKOT) 8.6 MG TABS tablet Take 2 tablets (17.2 mg total) by mouth daily. 120 tablet 2   triamcinolone (KENALOG) 0.025 % cream Apply 1 application topically 2 (two) times daily. 30 g 2   TRULICITY 1.5 UX/3.2GM SOPN INJECT 1.5 MG INTO THE SKIN ONCE A WEEK. (Patient taking differently: Inject 1.5 mg into the skin once a week. Saturdays) 2 mL 5   mometasone-formoterol (DULERA) 100-5 MCG/ACT AERO Inhale 2 puffs into the lungs 2 (two) times daily. (Patient not taking: Reported on 10/28/2022) 13 g 0   umeclidinium bromide (INCRUSE ELLIPTA) 62.5 MCG/ACT AEPB Inhale 1 puff into the lungs daily. (Patient not taking: Reported on 10/28/2022) 30 each 0   No current facility-administered medications for this encounter.    Allergies   Allergen Reactions   Beta Adrenergic Blockers Other (See Comments)    Sinus pauses   Canagliflozin Itching, Anxiety and Palpitations      Social History   Socioeconomic History   Marital status: Widowed    Spouse name: Not on file   Number of children: 3   Years of education: Not on file   Highest education level: High school graduate  Occupational History   Occupation: Disabled    Comment: Formerly worked in [employer] Use   Smoking status: Former    Packs/day: 1.00    Years: 40.00    Total pack years: 40.00    Types: Cigarettes    Quit date: 09/17/2019    Years since quitting: 3.1   Smokeless tobacco: Never  Vaping Use   Vaping Use: Never used  Substance and Sexual Activity   Alcohol use: No    Alcohol/week: 0.0 standard drinks of alcohol   Drug use: No   Sexual activity: Not Currently    Birth control/protection: Post-menopausal  Other Topics Concern   Not on file  Social History Narrative   Current Social History 02/25/2021        Patient lives with family in a home which is 2 stories. There are steps up to the entrance the patient uses with handrails.       Patient's method of transportation is personal car.      The highest level of education was some high school.      The patient currently disabled.      Identified important Relationships are "Family, my daughter and grandkids"       Pets : My dog       Interests / Fun: "sleeping, working in my flowerbed        Current Stressors: The war in [place]       Social Determinants of Health   Financial Resource Strain: Low Risk  (10/14/2022)   Overall Financial Resource Strain (CARDIA)    Difficulty of Paying Living Expenses: Not hard at all  Food Insecurity: No Food Insecurity (10/14/2022)   Hunger Vital Sign    Worried About Running Out of Food in the Last Year: Never true    Ran Out of Food in the Last Year: Never true  Transportation Needs: No Transportation Needs (10/14/2022)    PRAPARE - Transportation  Lack of Transportation (Medical): No    Lack of Transportation (Non-Medical): No  Physical Activity: Insufficiently Active (09/28/2022)   Exercise Vital Sign    Days of Exercise per Week: 1 day    Minutes of Exercise per Session: 10 min  Stress: No Stress Concern Present (09/28/2022)   [place]    Feeling of Stress : Not at all  Social Connections: Socially Isolated (09/28/2022)   Social Connection and Isolation Panel [NHANES]    Frequency of Communication with Friends and Family: More than three times a week    Frequency of Social Gatherings with Friends and Family: Never    Attends Religious Services: Never    Marine scientist or Organizations: No    Attends Archivist Meetings: Never    Marital Status: Widowed  Intimate Partner Violence: Not At Risk (09/28/2022)   Humiliation, Afraid, Rape, and Kick questionnaire    Fear of Current or Ex-Partner: No    Emotionally Abused: No    Physically Abused: No    Sexually Abused: No      Family History  Problem Relation Age of Onset   Diabetes Mellitus II Mother    Hypertension Mother    Cerebrovascular Accident Mother 55       Massive, resulted in death   Pulmonary embolism Father 59       Resulted in sudden death   Heart failure Brother    Obesity Brother    Coronary artery disease Brother    Obesity Brother    COPD Brother    Diabetes Mellitus II Brother    Healthy Brother    Healthy Brother    Healthy Daughter    Healthy Son    Healthy Son     Vitals:   10/28/22 0954  BP: (!) 150/80  Pulse: 65  SpO2: 97%  Weight: 98.9 kg (218 lb)  Height: '5\' 8"'$  (1.727 m)    PHYSICAL EXAM: General:  Well appearing. Mod obese, using [place]. Normal WOB  HEENT: normal Neck: supple. no JVD. Carotids 2+ bilat; no bruits. No lymphadenopathy or thryomegaly appreciated. Cor: PMI nondisplaced. Regular rate & rhythm. No rubs, gallops  or murmurs. Lungs: clear Abdomen: soft, nontender, nondistended. No hepatosplenomegaly. No bruits or masses. Good bowel sounds. Extremities: no cyanosis, clubbing, rash, edema Neuro: alert & oriented x 3, cranial nerves grossly intact. moves all 4 extremities w/o difficulty. Affect pleasant.  ECG: Not performed    ASSESSMENT & PLAN:  1. HFpEF - Echo 11/23 EF 55-60%, RV normal - NYHA Class II-III confounded by COPD and deconditioning  - Volume status ok on exam. ReDs mildly elevated 36% - Continue Lasix 80 mg daily  - Increase Jardiance to 10 mg daily (currently on 5 mg diabetes dose) - Continue Entresto 97-103 bid - Add Spiro 12.5 mg daily  - BMP today   2. HTN - mod elevated - GDMT per above  - add spiro 12.5 mg daily  - BMP today   3. COPD - on home O2 - management per PCP   4. Type 2DM  - increasing Jardiance to 10 mg for CV benefit dose  - other management per PCP   NYHA II-III GDMT  Diuretic- Lasix 80 mg daily  BB-  no  Ace/ARB/ARNI Entresto 97-103 mg bid  MRA Spironolactone 12.5 mg daily  SGLT2i Jardiance 10 mg daily     Referred to HFSW (PCP, Medications, Transportation, ETOH Abuse, Drug Abuse, Insurance, Museum/gallery curator ):  No  Refer to Pharmacy:  No Refer to Home Health:  No Refer to Advanced Heart Failure Clinic: No  Refer to General Cardiology: Yes (Dr. Marlou Porch)  Follow up  in [facility] clinic for 1 more visit in 2 wks then plan to return to gen cards for further care

## 2022-10-28 NOTE — Patient Instructions (Signed)
Increase Farxiga to 10 mg daily - new Rx sent to local pharmacy for 10 mg tablet. You may finish your current supply and take two pills daily until finished with bottle. Then with new bottle, remember to decrease to one tablet daily. Start Spiro 12.5 mg daily - new Rx sent to local pharmacy.  Labs drawn today - will call you if abnormal. Return to Heart Failure Clinic TOC in 2 - 3 weeks.

## 2022-10-28 NOTE — Telephone Encounter (Signed)
Call attempted to confirm HV TOC appt today at [place] appropriate VM left with callback number.   Pricilla Holm, MSN, RN Heart Failure Nurse Navigator

## 2022-11-03 ENCOUNTER — Encounter: Payer: Self-pay | Admitting: Dietician

## 2022-11-04 ENCOUNTER — Other Ambulatory Visit: Payer: Self-pay | Admitting: Student in an Organized Health Care Education/Training Program

## 2022-11-04 DIAGNOSIS — E113393 Type 2 diabetes mellitus with moderate nonproliferative diabetic retinopathy without macular edema, bilateral: Secondary | ICD-10-CM

## 2022-11-04 DIAGNOSIS — I10 Essential (primary) hypertension: Secondary | ICD-10-CM

## 2022-11-09 ENCOUNTER — Ambulatory Visit (INDEPENDENT_AMBULATORY_CARE_PROVIDER_SITE_OTHER): Payer: Medicare Other | Admitting: Student in an Organized Health Care Education/Training Program

## 2022-11-09 ENCOUNTER — Encounter: Payer: Self-pay | Admitting: Student in an Organized Health Care Education/Training Program

## 2022-11-09 VITALS — BP 148/63 | HR 60 | Temp 97.8°F | Ht 67.0 in | Wt 214.7 lb

## 2022-11-09 DIAGNOSIS — Z794 Long term (current) use of insulin: Secondary | ICD-10-CM | POA: Diagnosis not present

## 2022-11-09 DIAGNOSIS — I5042 Chronic combined systolic (congestive) and diastolic (congestive) heart failure: Secondary | ICD-10-CM

## 2022-11-09 DIAGNOSIS — J449 Chronic obstructive pulmonary disease, unspecified: Secondary | ICD-10-CM

## 2022-11-09 DIAGNOSIS — Z87891 Personal history of nicotine dependence: Secondary | ICD-10-CM

## 2022-11-09 DIAGNOSIS — E113393 Type 2 diabetes mellitus with moderate nonproliferative diabetic retinopathy without macular edema, bilateral: Secondary | ICD-10-CM | POA: Diagnosis not present

## 2022-11-09 DIAGNOSIS — J439 Emphysema, unspecified: Secondary | ICD-10-CM

## 2022-11-09 DIAGNOSIS — I110 Hypertensive heart disease with heart failure: Secondary | ICD-10-CM | POA: Diagnosis not present

## 2022-11-09 DIAGNOSIS — I10 Essential (primary) hypertension: Secondary | ICD-10-CM

## 2022-11-09 LAB — POCT GLYCOSYLATED HEMOGLOBIN (HGB A1C): Hemoglobin A1C: 6.9 % — AB (ref 4.0–5.6)

## 2022-11-09 LAB — GLUCOSE, CAPILLARY: Glucose-Capillary: 153 mg/dL — ABNORMAL HIGH (ref 70–99)

## 2022-11-09 MED ORDER — INSULIN LISPRO (1 UNIT DIAL) 100 UNIT/ML (KWIKPEN): 14.0000 [IU] | PEN_INJECTOR | Freq: Three times a day (TID) | SUBCUTANEOUS | 11 refills | Status: DC

## 2022-11-09 MED ORDER — MOMETASONE FURO-FORMOTEROL FUM 100-5 MCG/ACT IN AERO: 2.0000 | INHALATION_SPRAY | Freq: Two times a day (BID) | RESPIRATORY_TRACT | 3 refills | Status: DC

## 2022-11-09 NOTE — Progress Notes (Signed)
Established Patient Office Visit  Subjective   Patient ID: Alicia [name], female    DOB: October 12, 1956  Age: 66 y.o. MRN: [id number]   HPI  66 year old person living with diabetes, hypertension, advanced COPD here for a hospital follow-up visit.  She was admitted for 3 days in early December for acute exacerbation of heart failure with preserved ejection fraction.  She was diuresed with IV Lasix and did well.  After discharge she has been doing okay at home.  Good adherence with her medications, though there is some confusion she did not bring her pill bottles in today.  She stopped taking spironolactone because she felt like it was causing some changes in her vision.  She is being treated with intraocular injections for diabetic retinopathy and has follow-up with her ophthalmologist in early January.  She reports her breathing has been stable, uses 4 L of supplemental oxygen consistently.  She is able to complete all her activities of daily living and at home.  She has help from grandchildren when she leaves the house.  Able to walk short distances before having to stop to rest.  No fevers or chills.  Cough is stable.  No chest pain or pressure.  Reports much improved lower extremity edema.  Oral Lasix is very effective for her.  Currently does not weigh herself daily.    Objective:     BP (!) 148/63 (BP Location: Right Arm, Patient Position: Sitting, Cuff Size: Small)   Pulse 60   Temp 97.8 F (36.6 C) (Oral)   Wt 214 lb 11.2 oz (97.4 kg)   LMP  (LMP Unknown)   SpO2 96%   BMI 32.65 kg/m    Physical Exam  General: Chronically ill-appearing woman, no distress Lungs: Clear throughout, minimal expiratory wheezing, no crackles CV: Regular rate and rhythm, no murmurs Abd: Soft and nontender Ext: Warm and well-perfused with trace bilateral pitting edema Neuro: Alert, conversational, full strength in the upper and lower extremities    Assessment & Plan:   Problem List Items Addressed  This Visit       High   Type 2 diabetes mellitus with moderate nonproliferative diabetic retinopathy (HCC) - Primary (Chronic)    Chronic and well-controlled with hemoglobin A1c of 6.9% today.  I reviewed her glucose log which showed no hypoglycemia, good fasting blood sugars in the morning and then wide range of blood sugars postprandially.  Will plan to continue with long-acting insulin 40 units daily, mealtime insulin 14 units 3 times daily, Trulicity 1.5 mg weekly, and Farxiga 10 mg daily.  Follow-up in 3 months.      Relevant Medications   insulin lispro (HUMALOG) 100 UNIT/ML KwikPen   Other Relevant Orders   POC Hbg A1C (Completed)   Essential hypertension (Chronic)    Blood pressure with okay control today with systolic at 409 and relatively low diastolic at 63.  She is at increased risk for falls.  Will continue with amlodipine 10 mg daily, Entresto 200 mg twice daily.  There was some confusion about spironolactone, she was not taking as directed, will restart this at 12.5 mg daily which should help with systolic blood pressure a little more.      COPD (chronic obstructive pulmonary disease) (HCC) (Chronic)    COPD is symptomatically stable at this time.  We reviewed her mother-in-law's at home, made some clarifications.  I would like her to use triple therapy with Incruse daily and Dulera twice daily.  Patient should bring her inhalers  into her next visit so we can double check that she is taking them appropriately.      Relevant Medications   mometasone-formoterol (DULERA) 100-5 MCG/ACT AERO   Chronic combined systolic and diastolic heart failure (HCC) (Chronic)    Patient with chronic heart failure, previously mildly reduced EF but roost recent echo shows preserved ejection fraction with diastolic dysfunction.  She was hospitalized earlier this month for 3 days for diuresis.  She has done well since being home.  Weight today is 97 kg and she appears euvolemic on exam.  NYHA class II  symptoms.  Will plan to continue with Entresto 200 mg twice daily, Farxiga 10 mg daily, spironolactone 12.5 mg daily, and Lasix 80 mg twice daily.  She is going to purchase a scale and monitor her daily weights and take extra Lasix as needed for weight gain over 3 pounds.       Return in about 3 months (around 02/08/2023).    Axel Filler, MD

## 2022-11-09 NOTE — Assessment & Plan Note (Signed)
COPD is symptomatically stable at this time.  We reviewed her mother-in-law's at home, made some clarifications.  I would like her to use triple therapy with Incruse daily and Dulera twice daily.  Patient should bring her inhalers into her next visit so we can double check that she is taking them appropriately.

## 2022-11-09 NOTE — Assessment & Plan Note (Signed)
Patient with chronic heart failure, previously mildly reduced EF but roost recent echo shows preserved ejection fraction with diastolic dysfunction.  She was hospitalized earlier this month for 3 days for diuresis.  She has done well since being home.  Weight today is 97 kg and she appears euvolemic on exam.  NYHA class II symptoms.  Will plan to continue with Entresto 200 mg twice daily, Farxiga 10 mg daily, spironolactone 12.5 mg daily, and Lasix 80 mg twice daily.  She is going to purchase a scale and monitor her daily weights and take extra Lasix as needed for weight gain over 3 pounds.

## 2022-11-09 NOTE — Assessment & Plan Note (Signed)
Blood pressure with okay control today with systolic at 700 and relatively low diastolic at 63.  She is at increased risk for falls.  Will continue with amlodipine 10 mg daily, Entresto 200 mg twice daily.  There was some confusion about spironolactone, she was not taking as directed, will restart this at 12.5 mg daily which should help with systolic blood pressure a little more.

## 2022-11-09 NOTE — Assessment & Plan Note (Signed)
Chronic and well-controlled with hemoglobin A1c of 6.9% today.  I reviewed her glucose log which showed no hypoglycemia, good fasting blood sugars in the morning and then wide range of blood sugars postprandially.  Will plan to continue with long-acting insulin 40 units daily, mealtime insulin 14 units 3 times daily, Trulicity 1.5 mg weekly, and Farxiga 10 mg daily.  Follow-up in 3 months.

## 2022-11-14 ENCOUNTER — Other Ambulatory Visit: Payer: Self-pay | Admitting: Student in an Organized Health Care Education/Training Program

## 2022-11-14 DIAGNOSIS — E113393 Type 2 diabetes mellitus with moderate nonproliferative diabetic retinopathy without macular edema, bilateral: Secondary | ICD-10-CM

## 2022-11-17 NOTE — Telephone Encounter (Signed)
Called to confirm Heart & Vascular Transitions of Care appointment at 2:30 pm on 11/18/22. Patient reminded to bring all medications and pill box organizer with them. Confirmed patient has transportation. Gave directions, instructed to utilize [place] parking.  Confirmed appointment prior to ending call.    Earnestine Leys, BSN, Clinical cytogeneticist Only

## 2022-11-18 ENCOUNTER — Ambulatory Visit (HOSPITAL_COMMUNITY): Payer: Medicare Other

## 2022-11-18 ENCOUNTER — Other Ambulatory Visit: Payer: Self-pay | Admitting: Student in an Organized Health Care Education/Training Program

## 2022-11-18 DIAGNOSIS — J9611 Chronic respiratory failure with hypoxia: Secondary | ICD-10-CM | POA: Diagnosis not present

## 2022-11-18 NOTE — Progress Notes (Incomplete)
HEART IMPACT TRANSITIONS OF CARE   Primary Care: Axel Filler, MD  Primary Cardiologist: Candee Furbish, MD  HPI: Alicia [name] is a 66 y.o. female w/ h/o systolic/diastolic heart failure with EF originally 30% in 2014 improved to 60% in 2020. Also w/ severe COPD on home O2, HTN and HLD. Followed by Dr. Marlou Porch.    Recently admitted w/ acute hypoxic respiratory failure due to CHF w/ volume overload and COPDE. Diuresed w/ IV Lasix and treated w/ abx, steroids and nebs. Echo showed normal EF 55-60% and normal RV. After diureses, was transitioned to PO Lasix. Also placed on [place]. D/c Wt 218 lb. Referred to [facility] clinic.    Presents today for f/u. Reports feeling better. On 4L [place], which is usual baseline. NYHA Class II-III c/w prior baseline. Reports full med compliance. BP elevated 150/80. ReDs 36%.    Cardiac Testing    2D Echo 10/12/22 Left ventricular ejection fraction, by estimation, is 55 to 60%. Left ventricular ejection fraction by PLAX is 56 %. The left ventricle has normal function. The left ventricle has no regional wall motion abnormalities. The left ventricular internal cavity size was moderately dilated. There is mild concentric left ventricular hypertrophy. Left ventricular diastolic parameters are consistent with Grade II diastolic dysfunction (pseudonormalization). 1. Right ventricular systolic function is normal. The right ventricular size is normal. There is mildly elevated pulmonary artery systolic pressure. 2. 3. Left atrial size was severely dilated. 4. Right atrial size was moderately dilated. The mitral valve is normal in structure. Trivial mitral valve regurgitation. No evidence of mitral stenosis. 5. The aortic valve is tricuspid. Aortic valve regurgitation is not visualized. No aortic stenosis is present. 6. The inferior vena cava is normal in size with greater than 50% respiratory variability, suggesting right atrial pressure of 3  mmHg.        ROS: All systems negative except as listed in HPI, PMH and Problem List.  SH:  Social History   Socioeconomic History   Marital status: Widowed    Spouse name: Not on file   Number of children: 3   Years of education: Not on file   Highest education level: High school graduate  Occupational History   Occupation: Disabled    Comment: Formerly worked in [place] Use   Smoking status: Former    Packs/day: 1.00    Years: 40.00    Total pack years: 40.00    Types: Cigarettes    Quit date: 09/17/2019    Years since quitting: 3.1   Smokeless tobacco: Never  Vaping Use   Vaping Use: Never used  Substance and Sexual Activity   Alcohol use: No    Alcohol/week: 0.0 standard drinks of alcohol   Drug use: No   Sexual activity: Not Currently    Birth control/protection: Post-menopausal  Other Topics Concern   Not on file  Social History Narrative   Current Social History 02/25/2021        Patient lives with family in a home which is 2 stories. There are steps up to the entrance the patient uses with handrails.       Patient's method of transportation is personal car.      The highest level of education was some high school.      The patient currently disabled.      Identified important Relationships are "Family, my daughter and grandkids"       Pets : My dog  Interests / Fun: "sleeping, working in my flowerbed        Current Stressors: The war in [place]       Social Determinants of Health   Financial Resource Strain: Low Risk  (10/14/2022)   Overall Financial Resource Strain (CARDIA)    Difficulty of Paying Living Expenses: Not hard at all  Food Insecurity: No Food Insecurity (10/14/2022)   Hunger Vital Sign    Worried About Running Out of Food in the Last Year: Never true    Ran Out of Food in the Last Year: Never true  Transportation Needs: No Transportation Needs (10/14/2022)   PRAPARE - Radiographer, therapeutic (Medical): No    Lack of Transportation (Non-Medical): No  Physical Activity: Insufficiently Active (09/28/2022)   Exercise Vital Sign    Days of Exercise per Week: 1 day    Minutes of Exercise per Session: 10 min  Stress: No Stress Concern Present (09/28/2022)   [place]    Feeling of Stress : Not at all  Social Connections: Socially Isolated (09/28/2022)   Social Connection and Isolation Panel [NHANES]    Frequency of Communication with Friends and Family: More than three times a week    Frequency of Social Gatherings with Friends and Family: Never    Attends Religious Services: Never    Marine scientist or Organizations: No    Attends Archivist Meetings: Never    Marital Status: Widowed  Intimate Partner Violence: Not At Risk (09/28/2022)   Humiliation, Afraid, Rape, and Kick questionnaire    Fear of Current or Ex-Partner: No    Emotionally Abused: No    Physically Abused: No    Sexually Abused: No    FH:  Family History  Problem Relation Age of Onset   Diabetes Mellitus II Mother    Hypertension Mother    Cerebrovascular Accident Mother 66       Massive, resulted in death   Pulmonary embolism Father 44       Resulted in sudden death   Heart failure Brother    Obesity Brother    Coronary artery disease Brother    Obesity Brother    COPD Brother    Diabetes Mellitus II Brother    Healthy Brother    Healthy Brother    Healthy Daughter    Healthy Son    Healthy Son     Past Medical History:  Diagnosis Date   Abscess of skin of abdomen 08/31/2018   Acquired lactose intolerance 09/24/2017   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  Non-functioning   Aortic atherosclerosis ([place]) 09/24/2017   Asymptomatic, found on CT scan   Blood transfusion without reported diagnosis    Chronic Systolic Heart Failure 07/01/9832   Felt to  be non-ischemic and secondary to hypertension.  Echo (05/28/2014): LVEF 25%.  Is not interested in AICD placement.   COPD exacerbation ([place])    Coronary artery disease involving native coronary artery of native heart without angina pectoris 11/09/2014   Cardiac cath (02/18/2014): Non-obstructive, mRCA 30%, dRCA 60%   Cystocele with uterine prolapse - grade 3 02/12/2016   Not interested in pessary after trying   Diverticulosis of colon 09/24/2017   Diverticulosis of colon 09/24/2017   Essential hypertension 10/15/2013   Gastroesophageal reflux disease 09/24/2017   History of cerebrovascular accident 05/10/2013   Per patient  report 6 previous strokes, most recent on 05/10/13.  No residual deficits.   History of cerebrovascular accident 05/10/2013   Per patient report 6 previous strokes, most recent on 05/10/13.  No residual deficits.   Hyperlipidemia    Overweight (BMI 25.0-29.9) 09/24/2017   Psoriasis 09/24/2017   Seasonal allergic rhinitis due to pollen 09/24/2017   Spring and early Fall   Small Bowel Obstruction (SBO) 01/13/2014   Ex-Lap & Lysis of Adhesion, 01/15/2014   Thyroid nodule 09/04/2019   Tobacco use disorder 01/15/2014   Type 2 diabetes mellitus with moderate nonproliferative diabetic retinopathy ([place]) 10/15/2013    Current Outpatient Medications  Medication Sig Dispense Refill   ACCU-CHEK AVIVA PLUS test strip USE TO TEST BLOOD SUGARS AS DIRECTED 100 strip 12   albuterol (VENTOLIN HFA) 108 (90 Base) MCG/ACT inhaler Inhale 2 puffs into the lungs every 6 (six) hours as needed. (Patient taking differently: Inhale 2 puffs into the lungs every 6 (six) hours as needed for wheezing or shortness of breath.) 18 g 5   amLODipine (NORVASC) 10 MG tablet TAKE 1 TABLET BY MOUTH EVERY DAY (Patient taking differently: Take 10 mg by mouth daily.) 90 tablet 3   aspirin 81 MG tablet Take 81 mg by mouth daily.     atorvastatin (LIPITOR) 80 MG tablet Take 1 tablet (80 mg total) by mouth daily. 90 tablet 3    B-D UF III MINI PEN NEEDLES 31G X 5 MM MISC USE 3 TIMES A DAY WITH HUMALOG 100 each 5   dapagliflozin propanediol (FARXIGA) 10 MG TABS tablet Take 1 tablet (10 mg total) by mouth daily before breakfast. 90 tablet 3   ENTRESTO 97-103 MG TAKE 1 TABLET BY MOUTH TWICE A DAY (Patient taking differently: Take 1 tablet by mouth 2 (two) times daily.) 180 tablet 3   ezetimibe (ZETIA) 10 MG tablet Take 1 tablet (10 mg total) by mouth daily. 90 tablet 3   furosemide (LASIX) 40 MG tablet TAKE 2 TABLETS BY MOUTH 2 TIMES DAILY. 360 tablet 3   insulin glargine-yfgn (SEMGLEE) 100 UNIT/ML injection Inject 0.4 mLs (40 Units total) into the skin at bedtime. 10 mL 11   insulin lispro (HUMALOG) 100 UNIT/ML KwikPen Inject 14 Units into the skin in the morning, at noon, and at bedtime. 15 mL 11   Insulin Pen Needle 32G X 4 MM MISC Use to inject insulin 4 times a day. The patient is insulin requiring, ICD 10 code 11.10. The patient injects 4 times per day. 400 each 3   Insulin Syringe-Needle U-100 31G X 15/64" 0.3 ML MISC Use to inject Humalog before meals three times a day 100 each 5   Lancets (ACCU-CHEK MULTICLIX) lancets Use to check your blood sugar four times daily: early morning, before a meal, two hours after a meal, and bedtime 100 each 12   mometasone-formoterol (DULERA) 100-5 MCG/ACT AERO Inhale 2 puffs into the lungs 2 (two) times daily. 13 g 3   nystatin powder Apply 1 Application topically 3 (three) times daily. (Patient taking differently: Apply 1 Application topically 2 (two) times daily.) 15 g 2   PATADAY 0.1 % ophthalmic solution Place 1 drop into both eyes 2 (two) times daily. (Patient taking differently: Place 1 drop into both eyes daily as needed for allergies.) 5 mL 1   senna (SENOKOT) 8.6 MG TABS tablet Take 2 tablets (17.2 mg total) by mouth daily. 120 tablet 2   spironolactone (ALDACTONE) 25 MG tablet Take 0.5 tablets (12.5 mg total) by  mouth daily. 45 tablet 3   triamcinolone (KENALOG) 0.025 %  cream Apply 1 application topically 2 (two) times daily. 30 g 2   TRULICITY 1.5 XH/3.7JI SOPN INJECT 1.5 MG INTO THE SKIN ONCE A WEEK. (Patient taking differently: Inject 1.5 mg into the skin once a week. Saturdays) 2 mL 5   No current facility-administered medications for this visit.    There were no vitals filed for this visit.  PHYSICAL EXAM:  General:  Well appearing. No resp difficulty HEENT: normal Neck: supple. JVP flat. Carotids 2+ bilaterally; no bruits. No lymphadenopathy or thryomegaly appreciated. Cor: PMI normal. Regular rate & rhythm. No rubs, gallops or murmurs. Lungs: clear Abdomen: soft, nontender, nondistended. No hepatosplenomegaly. No bruits or masses. Good bowel sounds. Extremities: no cyanosis, clubbing, rash, edema Neuro: alert & orientedx3, cranial nerves grossly intact. Moves all 4 extremities w/o difficulty. Affect pleasant.   ECG:   ASSESSMENT & PLAN:  1. HFpEF - Echo 11/23 EF 55-60%, RV normal - NYHA  - Continue Lasix 80 mg daily  - Continue jardiance to 10 mg daily  - Continue Entresto 97-103 bid - Continue Spiro 12.5 mg daily  - BMP today    2. HTN    3. COPD - on home O2 - management per PCP    4. Type 2DM  - increasing Jardiance to 10 mg for CV benefit dose  - other management per PCP     Follow up   Alicia [name] 8:17 AM

## 2022-11-23 ENCOUNTER — Other Ambulatory Visit: Payer: Self-pay | Admitting: Student in an Organized Health Care Education/Training Program

## 2022-11-24 ENCOUNTER — Ambulatory Visit (HOSPITAL_COMMUNITY): Payer: Medicare Other

## 2022-11-24 ENCOUNTER — Encounter (INDEPENDENT_AMBULATORY_CARE_PROVIDER_SITE_OTHER): Payer: Medicare Other | Admitting: Ophthalmology

## 2022-11-25 NOTE — Telephone Encounter (Signed)
Patient called in wanting to know why refill for atorvastatin was denied. Explained Dr. Acie Fredrickson sent Rx for 90 with 3 ref[facility] on [facility] to [facility]. She will contact [facility] to get this ready for her.

## 2022-11-30 ENCOUNTER — Encounter: Payer: Medicare Other | Admitting: Student in an Organized Health Care Education/Training Program

## 2022-11-30 ENCOUNTER — Telehealth (HOSPITAL_COMMUNITY): Payer: Self-pay | Admitting: *Deleted

## 2022-11-30 ENCOUNTER — Ambulatory Visit (HOSPITAL_COMMUNITY)
Admission: RE | Admit: 2022-11-30 | Discharge: 2022-11-30 | Disposition: A | Discharge status: Home / Self Care | Payer: Medicare Other | Source: Ambulatory Visit | Attending: Adult Health | Admitting: Adult Health

## 2022-11-30 VITALS — BP 150/80 | HR 70 | Wt 215.0 lb

## 2022-11-30 DIAGNOSIS — Z87891 Personal history of nicotine dependence: Secondary | ICD-10-CM | POA: Diagnosis not present

## 2022-11-30 DIAGNOSIS — Z794 Long term (current) use of insulin: Secondary | ICD-10-CM | POA: Insufficient documentation

## 2022-11-30 DIAGNOSIS — R058 Other specified cough: Secondary | ICD-10-CM | POA: Diagnosis not present

## 2022-11-30 DIAGNOSIS — Z7985 Long-term (current) use of injectable non-insulin antidiabetic drugs: Secondary | ICD-10-CM | POA: Diagnosis not present

## 2022-11-30 DIAGNOSIS — E119 Type 2 diabetes mellitus without complications: Secondary | ICD-10-CM | POA: Diagnosis not present

## 2022-11-30 DIAGNOSIS — Z7951 Long term (current) use of inhaled steroids: Secondary | ICD-10-CM | POA: Insufficient documentation

## 2022-11-30 DIAGNOSIS — Z7982 Long term (current) use of aspirin: Secondary | ICD-10-CM | POA: Diagnosis not present

## 2022-11-30 DIAGNOSIS — J449 Chronic obstructive pulmonary disease, unspecified: Secondary | ICD-10-CM | POA: Insufficient documentation

## 2022-11-30 DIAGNOSIS — I5032 Chronic diastolic (congestive) heart failure: Secondary | ICD-10-CM

## 2022-11-30 DIAGNOSIS — Z9981 Dependence on supplemental oxygen: Secondary | ICD-10-CM | POA: Insufficient documentation

## 2022-11-30 DIAGNOSIS — Z7984 Long term (current) use of oral hypoglycemic drugs: Secondary | ICD-10-CM | POA: Diagnosis not present

## 2022-11-30 DIAGNOSIS — I110 Hypertensive heart disease with heart failure: Secondary | ICD-10-CM | POA: Insufficient documentation

## 2022-11-30 DIAGNOSIS — I10 Essential (primary) hypertension: Secondary | ICD-10-CM | POA: Diagnosis not present

## 2022-11-30 DIAGNOSIS — Z79899 Other long term (current) drug therapy: Secondary | ICD-10-CM | POA: Diagnosis not present

## 2022-11-30 DIAGNOSIS — I5042 Chronic combined systolic (congestive) and diastolic (congestive) heart failure: Secondary | ICD-10-CM | POA: Diagnosis not present

## 2022-11-30 MED ORDER — SPIRONOLACTONE 25 MG PO TABS: 25.0000 mg | ORAL_TABLET | Freq: Every day | ORAL | 3 refills | Status: DC

## 2022-11-30 NOTE — Patient Instructions (Signed)
Medication Changes:  CHANGE spironolactone (aldactone) to '25mg'$  daily. Take one whole tablet, once a day.   Lab Work:  You have lab work already scheduled on 1/18 at [facility]. They will add on one more lab to draw that day for our clinic needs.   Referrals:  You have been referred back to Dr. Marlou Porch at [facility] Your next appt is listed below.   Special Instructions // Education:  Do the following things EVERYDAY: Weigh yourself in the morning before breakfast. Write it down and keep it in a log. Take your medicines as prescribed Eat low salt foods--Limit salt (sodium) to 2000 mg per day.  Stay as active as you can everyday Limit all fluids for the day to less than 2 liters  Follow-Up in: as needed.

## 2022-11-30 NOTE — Progress Notes (Signed)
HEART IMPACT TRANSITIONS OF CARE   Primary Care: Axel Filler, MD  Primary Cardiologist: Candee Furbish, MD  HPI: Alicia [name] is a 67 y.o. female w/ h/o systolic/diastolic heart failure with EF originally 30% in 2014 improved to 60% in 2020. Also w/ severe COPD on home O2, HTN and HLD. Quit smoking 3 year s ago/. Followed by Dr. Marlou Porch.    Recently admitted w/ acute hypoxic respiratory failure due to CHF w/ volume overload and COPDE. Diuresed w/ IV Lasix and treated w/ abx, steroids and nebs. Echo showed normal EF 55-60% and normal RV. After diureses, was transitioned to PO Lasix. Also placed on [place]  She was seen in HF TOC 10/2022. Jardiacne increased to 10 mg daily and spironlactone added.   Today she returns for HF follow up.Overall feeling ok but trying to get over a cold. Productive cough. SOB with exeriton but says she is ok as long as she takes her time. Wearing 4 liters [place]. Denies PND/Orthopnea. Appetite ok. No fever or chills. Weight at home 215-216 pounds. Taking all medications. Lives with her daughter and 2 grandchildren.    Cardiac Testing   2D Echo 10/12/22 Left ventricular ejection fraction, by estimation, is 55 to 60%. Left ventricular ejection fraction by PLAX is 56 %. The left ventricle has normal function. The left ventricle has no regional wall motion abnormalities. The left ventricular internal cavity size was moderately dilated. There is mild concentric left ventricular hypertrophy. Left ventricular diastolic parameters are consistent with Grade II diastolic dysfunction (pseudonormalization). 1. Right ventricular systolic function is normal. The right ventricular size is normal. There is mildly elevated pulmonary artery systolic pressure. 2. 3. Left atrial size was severely dilated. 4. Right atrial size was moderately dilated. The mitral valve is normal in structure. Trivial mitral valve regurgitation. No evidence of mitral stenosis. 5. The aortic  valve is tricuspid. Aortic valve regurgitation is not visualized. No aortic stenosis is present. 6. The inferior vena cava is normal in size with greater than 50% respiratory variability, suggesting right atrial pressure of 3 mmHg.     ROS: All systems negative except as listed in HPI, PMH and Problem List.  SH:  Social History   Socioeconomic History   Marital status: Widowed    Spouse name: Not on file   Number of children: 3   Years of education: Not on file   Highest education level: High school graduate  Occupational History   Occupation: Disabled    Comment: Formerly worked in [place] Use   Smoking status: Former    Packs/day: 1.00    Years: 40.00    Total pack years: 40.00    Types: Cigarettes    Quit date: 09/17/2019    Years since quitting: 3.2   Smokeless tobacco: Never  Vaping Use   Vaping Use: Never used  Substance and Sexual Activity   Alcohol use: No    Alcohol/week: 0.0 standard drinks of alcohol   Drug use: No   Sexual activity: Not Currently    Birth control/protection: Post-menopausal  Other Topics Concern   Not on file  Social History Narrative   Current Social History 02/25/2021        Patient lives with family in a home which is 2 stories. There are steps up to the entrance the patient uses with handrails.       Patient's method of transportation is personal car.      The highest level of education  was some high school.      The patient currently disabled.      Identified important Relationships are "Family, my daughter and grandkids"       Pets : My dog       Interests / Fun: "sleeping, working in my flowerbed        Current Stressors: The war in [place]       Social Determinants of Health   Financial Resource Strain: Low Risk  (10/14/2022)   Overall Financial Resource Strain (CARDIA)    Difficulty of Paying Living Expenses: Not hard at all  Food Insecurity: No Food Insecurity (10/14/2022)   Hunger Vital Sign     Worried About Running Out of Food in the Last Year: Never true    Ran Out of Food in the Last Year: Never true  Transportation Needs: No Transportation Needs (10/14/2022)   PRAPARE - Hydrologist (Medical): No    Lack of Transportation (Non-Medical): No  Physical Activity: Insufficiently Active (09/28/2022)   Exercise Vital Sign    Days of Exercise per Week: 1 day    Minutes of Exercise per Session: 10 min  Stress: No Stress Concern Present (09/28/2022)   [place]    Feeling of Stress : Not at all  Social Connections: Socially Isolated (09/28/2022)   Social Connection and Isolation Panel [NHANES]    Frequency of Communication with Friends and Family: More than three times a week    Frequency of Social Gatherings with Friends and Family: Never    Attends Religious Services: Never    Marine scientist or Organizations: No    Attends Archivist Meetings: Never    Marital Status: Widowed  Intimate Partner Violence: Not At Risk (09/28/2022)   Humiliation, Afraid, Rape, and Kick questionnaire    Fear of Current or Ex-Partner: No    Emotionally Abused: No    Physically Abused: No    Sexually Abused: No    FH:  Family History  Problem Relation Age of Onset   Diabetes Mellitus II Mother    Hypertension Mother    Cerebrovascular Accident Mother 37       Massive, resulted in death   Pulmonary embolism Father 6       Resulted in sudden death   Heart failure Brother    Obesity Brother    Coronary artery disease Brother    Obesity Brother    COPD Brother    Diabetes Mellitus II Brother    Healthy Brother    Healthy Brother    Healthy Daughter    Healthy Son    Healthy Son     Past Medical History:  Diagnosis Date   Abscess of skin of abdomen 08/31/2018   Acquired lactose intolerance 09/24/2017   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  Non-functioning   Aortic atherosclerosis ([place]) 09/24/2017   Asymptomatic, found on CT scan   Blood transfusion without reported diagnosis    Chronic Systolic Heart Failure 1/61/0960   Felt to be non-ischemic and secondary to hypertension.  Echo (05/28/2014): LVEF 25%.  Is not interested in AICD placement.   COPD exacerbation ([place])    Coronary artery disease involving native coronary artery of native heart without angina pectoris 11/09/2014   Cardiac cath (02/18/2014): Non-obstructive, mRCA 30%, dRCA 60%   Cystocele with uterine prolapse - grade 3  02/12/2016   Not interested in pessary after trying   Diverticulosis of colon 09/24/2017   Diverticulosis of colon 09/24/2017   Essential hypertension 10/15/2013   Gastroesophageal reflux disease 09/24/2017   History of cerebrovascular accident 05/10/2013   Per patient report 6 previous strokes, most recent on 05/10/13.  No residual deficits.   History of cerebrovascular accident 05/10/2013   Per patient report 6 previous strokes, most recent on 05/10/13.  No residual deficits.   Hyperlipidemia    Overweight (BMI 25.0-29.9) 09/24/2017   Psoriasis 09/24/2017   Seasonal allergic rhinitis due to pollen 09/24/2017   Spring and early Fall   Small Bowel Obstruction (SBO) 01/13/2014   Ex-Lap & Lysis of Adhesion, 01/15/2014   Thyroid nodule 09/04/2019   Tobacco use disorder 01/15/2014   Type 2 diabetes mellitus with moderate nonproliferative diabetic retinopathy ([place]) 10/15/2013    Current Outpatient Medications  Medication Sig Dispense Refill   ACCU-CHEK AVIVA PLUS test strip USE TO TEST BLOOD SUGARS AS DIRECTED 100 strip 12   albuterol (VENTOLIN HFA) 108 (90 Base) MCG/ACT inhaler Inhale 2 puffs into the lungs every 6 (six) hours as needed. (Patient taking differently: Inhale 2 puffs into the lungs every 6 (six) hours as needed for wheezing or shortness of breath.) 18 g 5   amLODipine (NORVASC) 10 MG tablet TAKE 1 TABLET BY MOUTH EVERY  DAY (Patient taking differently: Take 10 mg by mouth daily.) 90 tablet 3   aspirin 81 MG tablet Take 81 mg by mouth daily.     atorvastatin (LIPITOR) 80 MG tablet Take 1 tablet (80 mg total) by mouth daily. 90 tablet 3   B-D UF III MINI PEN NEEDLES 31G X 5 MM MISC USE 3 TIMES A DAY WITH HUMALOG 100 each 5   dapagliflozin propanediol (FARXIGA) 10 MG TABS tablet Take 1 tablet (10 mg total) by mouth daily before breakfast. 90 tablet 3   Dulaglutide (TRULICITY) 1.5 HO/8.8LN SOPN Inject 1.5 mg into the skin once a week. Saturdays 2 mL 2   ezetimibe (ZETIA) 10 MG tablet Take 1 tablet (10 mg total) by mouth daily. 90 tablet 3   furosemide (LASIX) 40 MG tablet TAKE 2 TABLETS BY MOUTH 2 TIMES DAILY. 360 tablet 3   insulin glargine-yfgn (SEMGLEE) 100 UNIT/ML injection Inject 0.4 mLs (40 Units total) into the skin at bedtime. 10 mL 11   insulin lispro (HUMALOG) 100 UNIT/ML KwikPen Inject 14 Units into the skin in the morning, at noon, and at bedtime. 15 mL 11   Insulin Pen Needle 32G X 4 MM MISC Use to inject insulin 4 times a day. The patient is insulin requiring, ICD 10 code 11.10. The patient injects 4 times per day. 400 each 3   Insulin Syringe-Needle U-100 31G X 15/64" 0.3 ML MISC Use to inject Humalog before meals three times a day 100 each 5   Lancets (ACCU-CHEK MULTICLIX) lancets Use to check your blood sugar four times daily: early morning, before a meal, two hours after a meal, and bedtime 100 each 12   mometasone-formoterol (DULERA) 100-5 MCG/ACT AERO Inhale 2 puffs into the lungs 2 (two) times daily. 13 g 3   nystatin powder Apply 1 Application topically 3 (three) times daily. (Patient taking differently: Apply 1 Application topically 2 (two) times daily.) 15 g 2   PATADAY 0.1 % ophthalmic solution Place 1 drop into both eyes 2 (two) times daily. (Patient taking differently: Place 1 drop into both eyes daily as needed for allergies.) 5 mL  1   sacubitril-valsartan (ENTRESTO) 97-103 MG Take 1 tablet  by mouth 2 (two) times daily. 180 tablet 3   senna (SENOKOT) 8.6 MG TABS tablet Take 2 tablets (17.2 mg total) by mouth daily. 120 tablet 2   spironolactone (ALDACTONE) 25 MG tablet Take 0.5 tablets (12.5 mg total) by mouth daily. 45 tablet 3   triamcinolone (KENALOG) 0.025 % cream Apply 1 application topically 2 (two) times daily. 30 g 2   No current facility-administered medications for this encounter.    Vitals:   11/30/22 0859  BP: (!) 150/80  Pulse: 70  SpO2: 97%  Weight: 97.5 kg (215 lb)   Wt Readings from Last 3 Encounters:  11/30/22 97.5 kg (215 lb)  11/09/22 97.4 kg (214 lb 11.2 oz)  10/28/22 98.9 kg (218 lb)   Reds = 36%   PHYSICAL EXAM: General:  Walked in the clinic. No resp difficulty HEENT: normal Neck: supple. JVP flat. Carotids 2+ bilaterally; no bruits. No lymphadenopathy or thryomegaly appreciated. Cor: PMI normal. Regular rate & rhythm. No rubs, gallops or murmurs. Lungs: clear on 4 liters.  Abdomen: soft, nontender, nondistended. No hepatosplenomegaly. No bruits or masses. Good bowel sounds. Extremities: no cyanosis, clubbing, rash, edema Neuro: alert & orientedx3, cranial nerves grossly intact. Moves all 4 extremities w/o difficulty. Affect pleasant.   ECG: SR 74 bpm    ASSESSMENT & PLAN:  1. HFpEF - Echo 11/23 EF 55-60%, RV normal - NYHA III. Reds clip 36%.  - Volume status stable. Continue Lasix 80 mg daily  - Continue jardiance to 10 mg daily  - Continue Entresto 97-103 bid - Increase spironolactone 25 mg daily .  - Check BMET in 7 days.    2. HTN- Elevated. Continue with current regimen and increase spironolactone as above.    3. COPD - Former smoker. on home O2. Sats stable on 4 liters.  - management per PCP    4. Type 2DM  - Continue ardiance to 10 mg for CV benefit dose  - other management per PCP     Follow up as needed.  She has follow up with Dr Marlou Porch.   Cherron Blitzer NP-C  9:16 AM

## 2022-11-30 NOTE — Progress Notes (Signed)
ReDS Vest / Clip - 11/30/22 0900       ReDS Vest / Clip   Station Marker D    Ruler Value 34    ReDS Value Range Moderate volume overload    ReDS Actual Value 36

## 2022-11-30 NOTE — Telephone Encounter (Signed)
Call attempted to confirm HV TOC appt 11/30/22 at 9 am. HIPPA appropriate VM left with callback number.    Earnestine Leys, BSN, Clinical cytogeneticist Only

## 2022-12-03 ENCOUNTER — Encounter (INDEPENDENT_AMBULATORY_CARE_PROVIDER_SITE_OTHER): Payer: Medicare Other | Admitting: Ophthalmology

## 2022-12-03 DIAGNOSIS — H43813 Vitreous degeneration, bilateral: Secondary | ICD-10-CM

## 2022-12-03 DIAGNOSIS — E113312 Type 2 diabetes mellitus with moderate nonproliferative diabetic retinopathy with macular edema, left eye: Secondary | ICD-10-CM | POA: Diagnosis not present

## 2022-12-03 DIAGNOSIS — H4423 Degenerative myopia, bilateral: Secondary | ICD-10-CM | POA: Diagnosis not present

## 2022-12-03 DIAGNOSIS — I10 Essential (primary) hypertension: Secondary | ICD-10-CM

## 2022-12-03 DIAGNOSIS — H34832 Tributary (branch) retinal vein occlusion, left eye, with macular edema: Secondary | ICD-10-CM

## 2022-12-03 DIAGNOSIS — H35033 Hypertensive retinopathy, bilateral: Secondary | ICD-10-CM

## 2022-12-10 ENCOUNTER — Ambulatory Visit: Payer: 59 | Attending: Cardiology

## 2022-12-10 DIAGNOSIS — Z700 Counseling related to sexual attitude: Secondary | ICD-10-CM | POA: Insufficient documentation

## 2022-12-10 DIAGNOSIS — I700 Atherosclerosis of aorta: Secondary | ICD-10-CM

## 2022-12-10 DIAGNOSIS — I5032 Chronic diastolic (congestive) heart failure: Secondary | ICD-10-CM

## 2022-12-10 LAB — LIPID PANEL
Chol/HDL Ratio: 3.3 ratio (ref 0.0–4.4)
Cholesterol, Total: 152 mg/dL (ref 100–199)
HDL: 46 mg/dL (ref >39)
LDL Chol Calc (NIH): 87 mg/dL (ref 0–99)
Triglycerides: 101 mg/dL (ref 0–149)
VLDL Cholesterol Cal: 19 mg/dL (ref 5–40)

## 2022-12-10 LAB — BASIC METABOLIC PANEL
Anion gap: 10 (ref 5–15)
BUN: 16 mg/dL (ref 8–23)
CO2: 34 mmol/L — ABNORMAL HIGH (ref 22–32)
Calcium: 8.7 mg/dL — ABNORMAL LOW (ref 8.9–10.3)
Chloride: 93 mmol/L — ABNORMAL LOW (ref 98–111)
Creatinine, Ser: 1.02 mg/dL — ABNORMAL HIGH (ref 0.44–1.00)
GFR, Estimated: >60 mL/min (ref >60)
Glucose, Bld: 150 mg/dL — ABNORMAL HIGH (ref 70–99)
Potassium: 3.9 mmol/L (ref 3.5–5.1)
Sodium: 137 mmol/L (ref 135–145)

## 2022-12-10 LAB — ALT: ALT: 9 IU/L (ref 0–32)

## 2022-12-12 ENCOUNTER — Other Ambulatory Visit: Payer: Self-pay | Admitting: Student in an Organized Health Care Education/Training Program

## 2022-12-12 DIAGNOSIS — I10 Essential (primary) hypertension: Secondary | ICD-10-CM

## 2022-12-13 ENCOUNTER — Other Ambulatory Visit: Payer: Self-pay | Admitting: Student in an Organized Health Care Education/Training Program

## 2022-12-13 DIAGNOSIS — E113393 Type 2 diabetes mellitus with moderate nonproliferative diabetic retinopathy without macular edema, bilateral: Secondary | ICD-10-CM

## 2022-12-18 ENCOUNTER — Ambulatory Visit: Payer: 59 | Admitting: Nurse Practitioner

## 2022-12-18 ENCOUNTER — Other Ambulatory Visit: Payer: Self-pay | Admitting: Student in an Organized Health Care Education/Training Program

## 2022-12-18 DIAGNOSIS — E113393 Type 2 diabetes mellitus with moderate nonproliferative diabetic retinopathy without macular edema, bilateral: Secondary | ICD-10-CM

## 2022-12-19 DIAGNOSIS — J9611 Chronic respiratory failure with hypoxia: Secondary | ICD-10-CM | POA: Diagnosis not present

## 2022-12-21 ENCOUNTER — Other Ambulatory Visit: Payer: Self-pay

## 2022-12-21 DIAGNOSIS — E113393 Type 2 diabetes mellitus with moderate nonproliferative diabetic retinopathy without macular edema, bilateral: Secondary | ICD-10-CM

## 2022-12-21 NOTE — Progress Notes (Unsigned)
Cardiology Office Note:    Date:  12/22/2022   ID:  Aura Camps Patteson, DOB [birth year], MRN [id number]  PCP:  Axel Filler, MD  [place] [facility] Cardiologist:  Alicia Furbish, MD    Referring MD: Axel Filler,*   Patient Profile: (HFpEF) heart failure with preserved ejection fraction  EF was reduced but improved to normal  TTE (7/15): Mod LVH, EF 20-25%  TTE 10/12/2022: EF 55-60, no RWMA, mild concentric LVH, GR 2 DD, normal RVSF, mildly elevated PASP, severe LAE, moderate RAE, trivial MR, RAP 3, RVSP 36.5 Coronary artery disease  LHC (02/19/14): Mid RCA 30-40, distal RCA 60, EF 25%.  CCTA 07/15/2022: CAC score 1231 (99th percentile), moderate CAD (LM 1-24, RI 50-69, LAD 25-49, D1 50-69, LCx 25-49) >> FFR: Distal D10.66, distal OM1 0.7-predominantly diffuse disease/gradual narrowing; D1 and OM1 are small caliber vessels; overall most consistent with diffuse disease Sinus node dysfunction  Diabetes mellitus  Hypertension Hyperlipidemia  Hx of CVA Aortic atherosclerosis  Chronic Obstructive Pulmonary Disease on home O2     History of Present Illness:   Alicia [name] is a 67 y.o. female with the above problem list.  She was last seen in clinic 06/25/22 by Christen Bame, NP. She was admitted 11/19-11/21 a/c CHF. TTE continued to demonstrate preserved EF. She has been following up in the CHF [facility] clinic since and was last seen by Darrick Grinder, NP on 11/30/22. She returns for f/u of CHF, CAD.  She is here alone. She feels like she is doing well. She notes less shortness of breath. She is on continuous O2. She has not had chest pain, syncope, leg edema. She sleeps on an incline chronically. Her weights are down. She has noted dizziness with standing.      Reviewed and updated this encounter:   Tobacco  Allergies  Meds  Problems  Med Hx  Surg Hx  Fam Hx     ROS  Labs/Other Test Reviewed:   Recent Labs: 10/11/2022: B Natriuretic Peptide 403.8 10/13/2022:  Hemoglobin 10.5; Magnesium 2.1; Platelets 273 12/10/2022: ALT 9; BUN 16; Creatinine, Ser 1.02; Potassium 3.9; Sodium 137   Recent Lipid Panel Recent Labs    12/10/22 0802  CHOL 152  TRIG 101  HDL 46  LDLCALC 87     Risk Assessment/Calculations/Metrics:             Physical Exam:   VS:  BP 124/60   Pulse 68   Ht '5\' 7"'$  (1.702 m)   Wt 212 lb 9.6 oz (96.4 kg)   LMP  (LMP Unknown)   SpO2 92%   BMI 33.30 kg/m    Wt Readings from Last 3 Encounters:  12/22/22 212 lb 9.6 oz (96.4 kg)  11/30/22 215 lb (97.5 kg)  11/09/22 214 lb 11.2 oz (97.4 kg)    Constitutional:      Appearance: Not in distress.  Pulmonary:     Breath sounds: No wheezing. No rales.  Cardiovascular:     Normal rate. Regular rhythm.     Murmurs: There is no murmur.  Edema:    Peripheral edema absent.  Abdominal:     Palpations: Abdomen is soft.  Skin:    General: Skin is warm and dry.         ASSESSMENT & PLAN:   Chronic heart failure with preserved ejection fraction (HFpEF) ([place]) EF was 25 in 2015 but has improved to normal. She was admitted with decompensated CHF in Nov 2023. She is  mainly limited by Chronic Obstructive Pulmonary Disease. NYHA III. Volume status stable. She notes orthostatic intolerance.  Decrease Lasix to 80 mg in A and 40 mg in P BMET today Daily weights. Call in weights in 1 week. If wt increasing, will need to resume Lasix 80 twice daily  Continue spironolactone 25 mg once daily, Farxiga 10 mg once daily, Entresto 97/103 mg twice daily  No beta-blocker due to Chronic Obstructive Pulmonary Disease  F/u 4 mos  Coronary artery disease involving native coronary artery of native heart without angina pectoris Moderate diffuse and small vessel disease by CCTA with FFR in 06/2022. She is not having anginal symptoms. Continue ASA 81 mg once daily, Atorvastatin 80 mg once daily. F/u 4 mos.   Essential hypertension The patient's blood pressure is controlled on her current regimen.   Continue current therapy with Amlodipine 10 mg, Entresto 97/103 mg twice daily, spironolactone 25 mg.  Hyperlipidemia Goal LDL is at least < 70, ideally < 55. LDL was too high earlier this month. She has changed her diet since her admission to the hospital with a/c CHF. Continue Atorvastatin 80 mg, Ezetimibe 10 mg. Repeat CMET, fasting Lipids in 3 mos. If LDL still > 70, plan referral to PharmD clinic for initiation of PCSK9 inhib.   Aortic atherosclerosis (HCC) Continue ASA, Statin.   Sinoatrial node dysfunction (HCC) No beta-blocker due to SSS and Chronic Obstructive Pulmonary Disease.   COPD (chronic obstructive pulmonary disease) (HCC) On chronic O2. She is followed by pulmonology.            Dispo:  Return in about 4 months (around 04/22/2023) for Routine Follow Up w/ Dr. Marlou Porch.   Signed, Alicia Dopp, PA-C  12/22/2022 11:34 AM    [facility], [place], [place]  [address] Phone: [phone]; Fax: [phone]

## 2022-12-22 ENCOUNTER — Encounter: Payer: Self-pay | Admitting: Physician Assistant

## 2022-12-22 ENCOUNTER — Ambulatory Visit: Payer: 59 | Attending: Nurse Practitioner | Admitting: Physician Assistant

## 2022-12-22 VITALS — BP 124/60 | HR 68 | Ht 67.0 in | Wt 212.6 lb

## 2022-12-22 DIAGNOSIS — I495 Sick sinus syndrome: Secondary | ICD-10-CM | POA: Diagnosis not present

## 2022-12-22 DIAGNOSIS — E7800 Pure hypercholesterolemia, unspecified: Secondary | ICD-10-CM | POA: Diagnosis not present

## 2022-12-22 DIAGNOSIS — I2510 Atherosclerotic heart disease of native coronary artery without angina pectoris: Secondary | ICD-10-CM

## 2022-12-22 DIAGNOSIS — I700 Atherosclerosis of aorta: Secondary | ICD-10-CM | POA: Diagnosis not present

## 2022-12-22 DIAGNOSIS — E785 Hyperlipidemia, unspecified: Secondary | ICD-10-CM

## 2022-12-22 DIAGNOSIS — J439 Emphysema, unspecified: Secondary | ICD-10-CM

## 2022-12-22 DIAGNOSIS — I5032 Chronic diastolic (congestive) heart failure: Secondary | ICD-10-CM | POA: Diagnosis not present

## 2022-12-22 LAB — BASIC METABOLIC PANEL
BUN/Creatinine Ratio: 21 (ref 12–28)
BUN: 22 mg/dL (ref 8–27)
CO2: 33 mmol/L — ABNORMAL HIGH (ref 20–29)
Calcium: 9.5 mg/dL (ref 8.7–10.3)
Chloride: 94 mmol/L — ABNORMAL LOW (ref 96–106)
Creatinine, Ser: 1.07 mg/dL — ABNORMAL HIGH (ref 0.57–1.00)
Glucose: 234 mg/dL — ABNORMAL HIGH (ref 70–99)
Potassium: 4.4 mmol/L (ref 3.5–5.2)
Sodium: 136 mmol/L (ref 134–144)
eGFR: 58 mL/min/{1.73_m2} — ABNORMAL LOW (ref >59)

## 2022-12-22 MED ORDER — TRULICITY 0.75 MG/0.5ML ~~LOC~~ SOAJ: 0.7500 mg | SUBCUTANEOUS | 1 refills | Status: DC

## 2022-12-22 MED ORDER — SPIRONOLACTONE 25 MG PO TABS: 25.0000 mg | ORAL_TABLET | Freq: Every day | ORAL | 3 refills | Status: DC

## 2022-12-22 MED ORDER — FUROSEMIDE 40 MG PO TABS: ORAL_TABLET | ORAL | 3 refills | Status: DC

## 2022-12-22 NOTE — Assessment & Plan Note (Signed)
No beta-blocker due to SSS and Chronic Obstructive Pulmonary Disease.

## 2022-12-22 NOTE — Assessment & Plan Note (Signed)
Continue ASA, Statin.

## 2022-12-22 NOTE — Assessment & Plan Note (Signed)
Goal LDL is at least < 70, ideally < 55. LDL was too high earlier this month. She has changed her diet since her admission to the hospital with a/c CHF. Continue Atorvastatin 80 mg, Ezetimibe 10 mg. Repeat CMET, fasting Lipids in 3 mos. If LDL still > 70, plan referral to PharmD clinic for initiation of PCSK9 inhib.

## 2022-12-22 NOTE — Telephone Encounter (Signed)
Pharmacy informed us that they do not have 1.'5mg'$  pen dose of Trulicity. I prescribed 0.'75mg'$  dose instead. Diabetes is well controlled, so I think lower dose will be acceptable until supply improves. I have appointment with her in March, we can also discuss changing to ozempic if needed.

## 2022-12-22 NOTE — Assessment & Plan Note (Signed)
Moderate diffuse and small vessel disease by CCTA with FFR in 06/2022. She is not having anginal symptoms. Continue ASA 81 mg once daily, Atorvastatin 80 mg once daily. F/u 4 mos.

## 2022-12-22 NOTE — Assessment & Plan Note (Signed)
EF was 25 in 2015 but has improved to normal. She was admitted with decompensated CHF in Nov 2023. She is mainly limited by Chronic Obstructive Pulmonary Disease. NYHA III. Volume status stable. She notes orthostatic intolerance.  Decrease Lasix to 80 mg in A and 40 mg in P BMET today Daily weights. Call in weights in 1 week. If wt increasing, will need to resume Lasix 80 twice daily  Continue spironolactone 25 mg once daily, Farxiga 10 mg once daily, Entresto 97/103 mg twice daily  No beta-blocker due to Chronic Obstructive Pulmonary Disease  F/u 4 mos

## 2022-12-22 NOTE — Patient Instructions (Signed)
Medication Instructions:  DECREASE your Furosemide (Lasix) to 80 mg (2 of the 40 mg tablets) in the morning and 40 mg (1 tablet) in the afternoon  *If you need a refill on your cardiac medications before your next appointment, please call your pharmacy*   Lab Work: BMET today In 3 months - CMET, Fasting Lipid panel  If you have labs (blood work) drawn today and your tests are completely normal, you will receive your results only by: [place] (if you have MyChart) OR A paper copy in the mail If you have any lab test that is abnormal or we need to change your treatment, we will call you to review the results.   Follow-Up: At [facility], you and your health needs are our priority.  As part of our continuing mission to provide you with exceptional heart care, we have created designated Provider Care Teams.  These Care Teams include your primary Cardiologist (physician) and Advanced Practice Providers (APPs -  Physician Assistants and Nurse Practitioners) who all work together to provide you with the care you need, when you need it.  We recommend signing up for the patient portal called "MyChart".  Sign up information is provided on this After Visit Summary.  MyChart is used to connect with patients for Virtual Visits (Telemedicine).  Patients are able to view lab/test results, encounter notes, upcoming appointments, etc.  Non-urgent messages can be sent to your provider as well.   To learn more about what you can do with MyChart, go to [email or web address].    Your next appointment:   4 month(s)  Provider:   Candee Furbish, MD     Other Instructions Weigh daily. Call in all of your weights after 1 week (attention: Richardson Dopp, PA-C)  If your weight increases 3 or more pounds in 1 day or 5 or more pounds in 7 days, call us as soon as possible.

## 2022-12-22 NOTE — Assessment & Plan Note (Signed)
On chronic O2. She is followed by pulmonology.

## 2022-12-22 NOTE — Assessment & Plan Note (Signed)
The patient's blood pressure is controlled on her current regimen.  Continue current therapy with Amlodipine 10 mg, Entresto 97/103 mg twice daily, spironolactone 25 mg.

## 2022-12-30 ENCOUNTER — Encounter (INDEPENDENT_AMBULATORY_CARE_PROVIDER_SITE_OTHER): Payer: 59 | Admitting: Ophthalmology

## 2022-12-30 DIAGNOSIS — H4421 Degenerative myopia, right eye: Secondary | ICD-10-CM

## 2022-12-30 DIAGNOSIS — I10 Essential (primary) hypertension: Secondary | ICD-10-CM

## 2022-12-30 DIAGNOSIS — E113312 Type 2 diabetes mellitus with moderate nonproliferative diabetic retinopathy with macular edema, left eye: Secondary | ICD-10-CM

## 2022-12-30 DIAGNOSIS — H43813 Vitreous degeneration, bilateral: Secondary | ICD-10-CM

## 2022-12-30 DIAGNOSIS — H35033 Hypertensive retinopathy, bilateral: Secondary | ICD-10-CM | POA: Diagnosis not present

## 2022-12-30 DIAGNOSIS — H34832 Tributary (branch) retinal vein occlusion, left eye, with macular edema: Secondary | ICD-10-CM | POA: Diagnosis not present

## 2023-01-12 ENCOUNTER — Other Ambulatory Visit: Payer: Self-pay | Admitting: Internal Medicine

## 2023-01-19 DIAGNOSIS — J9611 Chronic respiratory failure with hypoxia: Secondary | ICD-10-CM | POA: Diagnosis not present

## 2023-01-27 ENCOUNTER — Encounter (INDEPENDENT_AMBULATORY_CARE_PROVIDER_SITE_OTHER): Payer: 59 | Admitting: Ophthalmology

## 2023-01-27 DIAGNOSIS — H4421 Degenerative myopia, right eye: Secondary | ICD-10-CM | POA: Diagnosis not present

## 2023-01-27 DIAGNOSIS — H35033 Hypertensive retinopathy, bilateral: Secondary | ICD-10-CM

## 2023-01-27 DIAGNOSIS — I10 Essential (primary) hypertension: Secondary | ICD-10-CM | POA: Diagnosis not present

## 2023-01-27 DIAGNOSIS — H43813 Vitreous degeneration, bilateral: Secondary | ICD-10-CM

## 2023-01-27 DIAGNOSIS — H34832 Tributary (branch) retinal vein occlusion, left eye, with macular edema: Secondary | ICD-10-CM

## 2023-01-27 DIAGNOSIS — E113312 Type 2 diabetes mellitus with moderate nonproliferative diabetic retinopathy with macular edema, left eye: Secondary | ICD-10-CM

## 2023-02-01 ENCOUNTER — Telehealth: Payer: Self-pay | Admitting: Student in an Organized Health Care Education/Training Program

## 2023-02-01 ENCOUNTER — Encounter: Payer: Self-pay | Admitting: Student in an Organized Health Care Education/Training Program

## 2023-02-01 ENCOUNTER — Ambulatory Visit (INDEPENDENT_AMBULATORY_CARE_PROVIDER_SITE_OTHER): Payer: 59 | Admitting: Student in an Organized Health Care Education/Training Program

## 2023-02-01 VITALS — BP 133/53 | HR 53 | Temp 97.7°F | Ht 67.0 in | Wt 225.3 lb

## 2023-02-01 DIAGNOSIS — I110 Hypertensive heart disease with heart failure: Secondary | ICD-10-CM

## 2023-02-01 DIAGNOSIS — Z794 Long term (current) use of insulin: Secondary | ICD-10-CM | POA: Diagnosis not present

## 2023-02-01 DIAGNOSIS — Z0000 Encounter for general adult medical examination without abnormal findings: Secondary | ICD-10-CM

## 2023-02-01 DIAGNOSIS — I10 Essential (primary) hypertension: Secondary | ICD-10-CM

## 2023-02-01 DIAGNOSIS — E2839 Other primary ovarian failure: Secondary | ICD-10-CM | POA: Diagnosis not present

## 2023-02-01 DIAGNOSIS — Z7984 Long term (current) use of oral hypoglycemic drugs: Secondary | ICD-10-CM

## 2023-02-01 DIAGNOSIS — I5032 Chronic diastolic (congestive) heart failure: Secondary | ICD-10-CM | POA: Diagnosis not present

## 2023-02-01 DIAGNOSIS — J439 Emphysema, unspecified: Secondary | ICD-10-CM | POA: Diagnosis not present

## 2023-02-01 DIAGNOSIS — E113393 Type 2 diabetes mellitus with moderate nonproliferative diabetic retinopathy without macular edema, bilateral: Secondary | ICD-10-CM | POA: Diagnosis not present

## 2023-02-01 LAB — POCT GLYCOSYLATED HEMOGLOBIN (HGB A1C): Hemoglobin A1C: 8.7 % — AB (ref 4.0–5.6)

## 2023-02-01 LAB — GLUCOSE, CAPILLARY: Glucose-Capillary: 122 mg/dL — ABNORMAL HIGH (ref 70–99)

## 2023-02-01 MED ORDER — INSULIN GLARGINE 100 UNIT/ML ~~LOC~~ SOLN: 20.0000 [IU] | Freq: Every day | SUBCUTANEOUS | 3 refills | Status: DC

## 2023-02-01 MED ORDER — INSULIN LISPRO (1 UNIT DIAL) 100 UNIT/ML (KWIKPEN): PEN_INJECTOR | SUBCUTANEOUS | 11 refills | Status: DC

## 2023-02-01 NOTE — Progress Notes (Signed)
Subjective:   Patient ID: Alicia [name] female   DOB: [birth year] 67 y.o.   MRN: [id number]  HPI: [name] is a 67 y.o. female with a PMHx of DM, HTN, HFpEF, and COPD who presents for follow up.   Pt complaining of intermittent episodes of hypoglycemia in which she wakes up in the middle of the night with cold sweats and blood sugar in the high 60s. States she has had some spikes around meal times which she attributes to soda. She does not plan on continued soda drinking. Has been taking her insulin, Trulicity, and [place] as prescribed. Has been compliant on other meds. Pt otherwise doing well with no complaints.  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 09/24/2017   Seasonal allergic rhinitis due to pollen 09/24/2017   Healthcare maintenance 09/24/2017   Obesity (BMI 30.0-34.9) 09/24/2017   Hyperlipidemia    Uterine prolapse 02/12/2016   Coronary artery disease involving native coronary artery of native heart without angina pectoris 11/09/2014   Chronic heart failure with preserved ejection fraction (HFpEF) ([place]) 05/10/2014   COPD (chronic obstructive pulmonary disease) ([place]) 02/18/2014   Type 2 diabetes mellitus with moderate nonproliferative diabetic retinopathy ([place]) 10/15/2013   Essential hypertension 10/15/2013     Current Outpatient Medications  Medication Sig Dispense Refill   insulin glargine (LANTUS) 100 UNIT/ML injection Inject 0.2 mLs (20 Units total) into the skin at bedtime. 10 mL 3   ACCU-CHEK AVIVA PLUS test strip USE TO TEST BLOOD SUGARS AS DIRECTED 100 strip 12    albuterol (VENTOLIN HFA) 108 (90 Base) MCG/ACT inhaler TAKE 2 PUFFS BY MOUTH EVERY 6 HOURS AS NEEDED 18 each 5   amLODipine (NORVASC) 10 MG tablet TAKE 1 TABLET BY MOUTH EVERY DAY 90 tablet 3   aspirin 81 MG tablet Take 81 mg by mouth daily.     atorvastatin (LIPITOR) 80 MG tablet Take 1 tablet (80 mg total) by mouth daily. 90 tablet 3   B-D UF III MINI PEN NEEDLES 31G X 5 MM MISC USE 3 TIMES A DAY WITH HUMALOG 100 each 5   dapagliflozin propanediol (FARXIGA) 10 MG TABS tablet Take 1 tablet (10 mg total) by mouth daily before breakfast. 90 tablet 3   Dulaglutide (TRULICITY) A999333 0000000 SOPN Inject 0.75 mg into the skin once a week. 6 mL 1   ezetimibe (ZETIA) 10 MG tablet Take 1 tablet (10 mg total) by mouth daily. 90 tablet 3   furosemide (LASIX) 40 MG tablet Take 2 tablets (80 mg) by mouth in the AM and 1 tablet (40 mg) by mouth in the PM 270 tablet 3   insulin lispro (HUMALOG) 100 UNIT/ML KwikPen Inject 14 units before breakfast and lunch, inject 20 units before dinner 15 mL 11   Insulin Pen Needle 32G X 4 MM MISC Use to inject insulin 4 times a day. The patient is insulin requiring, ICD 10 code 11.10. The patient injects 4 times per day. 400 each 3   Insulin Syringe-Needle U-100 31G X 15/64" 0.3 ML MISC Use to inject Humalog before meals three times a day 100 each 5  Lancets (ACCU-CHEK MULTICLIX) lancets Use to check your blood sugar four times daily: early morning, before a meal, two hours after a meal, and bedtime 100 each 12   mometasone-formoterol (DULERA) 100-5 MCG/ACT AERO Inhale 2 puffs into the lungs 2 (two) times daily. 13 g 3   nystatin powder Apply 1 Application topically 3 (three) times daily. 15 g 2   sacubitril-valsartan (ENTRESTO) 97-103 MG Take 1 tablet by mouth 2 (two) times daily. 180 tablet 3   senna (SENOKOT) 8.6 MG TABS tablet Take 2 tablets (17.2 mg total) by mouth daily. 120 tablet 2   spironolactone (ALDACTONE) 25 MG tablet Take 1 tablet (25 mg total) by mouth daily. 90  tablet 3   triamcinolone (KENALOG) 0.025 % cream Apply 1 application topically 2 (two) times daily. 30 g 2   No current facility-administered medications for this visit.     Review of Systems: Pertinent ROS listed in H&P. Otherwise negative   Objective:   Physical Exam: Vitals:   02/01/23 0816  BP: (!) 133/53  Pulse: (!) 53  Temp: 97.7 F (36.5 C)  TempSrc: Oral  SpO2: 96%  Weight: 225 lb 4.8 oz (102.2 kg)  Height: '5\' 7"'$  (1.702 m)   General: Chronically ill-appearing woman, no distress Lungs: Clear throughout, minimal expiratory wheezing, no crackles CV: Regular rate and rhythm, no murmurs Ext: Warm and well-perfused Foot exam: No ulcerations or overlying skin changes. Bilateral palpable DP pulses.  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 Reviewed pt's glucose log. Appears to have had several blood sugar drops to the 60s overnight. Sugar appears to be elevated in the mid 200s around lunch time and late in the evening. Given concern for hypoglycemia, will decrease patient's Lantus with continuation of her other meds.   > Decrease Lantus from 40 units to 20 units > Mealtime insulin (14 units before breakfast and lunch, 20 units before dinner) > Trulicity 1.5 mg weekly > Farxiga 10 mg daily   Essential hypertension Pt blood pressure well controlled at 133/53 today. Will continue current regimen.   > Amlodipine 10 mg daily > Entresto 200 mg BID > Spironolactone 12.5 mg daily   Healthcare maintenance Made recommendation for DEXA scan which patient was agreeable to.   > DEXA scan   COPD (chronic obstructive pulmonary disease) (HCC) COPD symptomatically stable at this time. Will continue current medication regimen.  > Dulera BID  Chronic heart failure with preserved ejection fraction (HFpEF) ([place]) Patient  with chronic heart failure, previously mildly reduced EF but roost recent echo shows preserved ejection fraction with diastolic dysfunction. Appears to be doing well. Will continue current medication regimen.   > Entresto 200 mg BID > Farxiga 10 mg daily, > Spironolactone 12.5 mg daily > Lasix 80 mg twice daily

## 2023-02-01 NOTE — Assessment & Plan Note (Signed)
COPD symptomatically stable at this time. Will continue current medication regimen.  > Dulera BID

## 2023-02-01 NOTE — Progress Notes (Signed)
Attestation for Student Documentation:  I personally was present and performed or re-performed the history, physical exam and medical decision-making activities of this service and have verified that the service and findings are accurately documented in the student's note.  67 year old person here for management of diabetes.  A1c control is worse today, but she is also having episodes of symptomatic hypoglycemia during sleep.  As such we will have to decrease long-acting insulin, will drop it to 20 units.  We also talked about nutrition, hyperglycemia coming after lunch and dinner, she is pretty sure is related to soda intake.  Going to cut that out which should help lower the A1c.  Follow-up in 3 months.  Other chronic conditions are pretty well-controlled.  Though she is dependent on supplemental oxygen continuously, she has a good quality of life and is independent in all her activities of daily living.  Has good support from family to help with activities outside of the house.  Axel Filler, MD 02/01/2023, 1:46 PM

## 2023-02-01 NOTE — Assessment & Plan Note (Addendum)
A1c 8.7 today. Reviewed pt's glucose log. Appears to have had several blood sugar drops to the 60s overnight. Sugar appears to be elevated in the mid 200s around lunch time and late in the evening. Given concern for hypoglycemia, will decrease patient's Lantus with continuation of her other meds.   > Decrease Lantus from 40 units to 20 units > Mealtime insulin (14 units before breakfast and lunch, 20 units before dinner) > Trulicity 1.5 mg weekly > Farxiga 10 mg daily

## 2023-02-01 NOTE — Telephone Encounter (Signed)
Pt is requesting a call back about her test results for her A1C.

## 2023-02-01 NOTE — Assessment & Plan Note (Signed)
Pt blood pressure well controlled at 133/53 today. Will continue current regimen.   > Amlodipine 10 mg daily > Entresto 200 mg BID > Spironolactone 12.5 mg daily

## 2023-02-01 NOTE — Patient Instructions (Signed)
Today we discussed the following:  1) Type II Diabetes Mellitus Instructions: Decrease Lantus from 40 units to 20 units daily. Keep meal time insulin the same.  2) Osteoporosis screening  Instructions: Please follow up with DEXA scan appointment.

## 2023-02-01 NOTE — Assessment & Plan Note (Signed)
Made recommendation for DEXA scan which patient was agreeable to.   > DEXA scan

## 2023-02-01 NOTE — Assessment & Plan Note (Addendum)
Patient with chronic heart failure, previously mildly reduced EF but roost recent echo shows preserved ejection fraction with diastolic dysfunction. Appears to be doing well. Will continue current medication regimen.   > Entresto 200 mg BID > Farxiga 10 mg daily, > Spironolactone 12.5 mg daily > Lasix 80 mg twice daily

## 2023-02-02 NOTE — Telephone Encounter (Signed)
Done. Thank you. Apparently she needs to tell Dr. Zigmund Daniel her A1c result prior to the eye injections she receives.

## 2023-02-04 ENCOUNTER — Inpatient Hospital Stay: Admission: RE | Admit: 2023-02-04 | Payer: 59 | Source: Ambulatory Visit

## 2023-02-06 ENCOUNTER — Other Ambulatory Visit: Payer: Self-pay

## 2023-02-06 ENCOUNTER — Emergency Department (HOSPITAL_COMMUNITY): Payer: 59

## 2023-02-06 ENCOUNTER — Inpatient Hospital Stay (HOSPITAL_COMMUNITY)
Admission: EM | Admit: 2023-02-06 | Discharge: 2023-02-11 | DRG: 291 | Disposition: A | Discharge status: Home / Self Care | Payer: 59 | Attending: Internal Medicine | Admitting: Internal Medicine

## 2023-02-06 ENCOUNTER — Encounter (HOSPITAL_COMMUNITY): Payer: Self-pay

## 2023-02-06 ENCOUNTER — Other Ambulatory Visit: Payer: Self-pay | Admitting: Internal Medicine

## 2023-02-06 DIAGNOSIS — R3911 Hesitancy of micturition: Secondary | ICD-10-CM | POA: Diagnosis present

## 2023-02-06 DIAGNOSIS — I110 Hypertensive heart disease with heart failure: Secondary | ICD-10-CM | POA: Diagnosis not present

## 2023-02-06 DIAGNOSIS — Z1152 Encounter for screening for COVID-19: Secondary | ICD-10-CM | POA: Diagnosis not present

## 2023-02-06 DIAGNOSIS — J9621 Acute and chronic respiratory failure with hypoxia: Secondary | ICD-10-CM | POA: Diagnosis not present

## 2023-02-06 DIAGNOSIS — Z794 Long term (current) use of insulin: Secondary | ICD-10-CM | POA: Diagnosis not present

## 2023-02-06 DIAGNOSIS — R001 Bradycardia, unspecified: Secondary | ICD-10-CM | POA: Diagnosis present

## 2023-02-06 DIAGNOSIS — Z825 Family history of asthma and other chronic lower respiratory diseases: Secondary | ICD-10-CM | POA: Diagnosis not present

## 2023-02-06 DIAGNOSIS — R109 Unspecified abdominal pain: Secondary | ICD-10-CM | POA: Diagnosis not present

## 2023-02-06 DIAGNOSIS — R0902 Hypoxemia: Principal | ICD-10-CM

## 2023-02-06 DIAGNOSIS — J9620 Acute and chronic respiratory failure, unspecified whether with hypoxia or hypercapnia: Secondary | ICD-10-CM | POA: Diagnosis not present

## 2023-02-06 DIAGNOSIS — N3000 Acute cystitis without hematuria: Secondary | ICD-10-CM

## 2023-02-06 DIAGNOSIS — N390 Urinary tract infection, site not specified: Secondary | ICD-10-CM | POA: Diagnosis not present

## 2023-02-06 DIAGNOSIS — E785 Hyperlipidemia, unspecified: Secondary | ICD-10-CM | POA: Diagnosis present

## 2023-02-06 DIAGNOSIS — D509 Iron deficiency anemia, unspecified: Secondary | ICD-10-CM | POA: Diagnosis not present

## 2023-02-06 DIAGNOSIS — E113399 Type 2 diabetes mellitus with moderate nonproliferative diabetic retinopathy without macular edema, unspecified eye: Secondary | ICD-10-CM | POA: Diagnosis not present

## 2023-02-06 DIAGNOSIS — Z8249 Family history of ischemic heart disease and other diseases of the circulatory system: Secondary | ICD-10-CM

## 2023-02-06 DIAGNOSIS — J449 Chronic obstructive pulmonary disease, unspecified: Secondary | ICD-10-CM | POA: Diagnosis present

## 2023-02-06 DIAGNOSIS — E11649 Type 2 diabetes mellitus with hypoglycemia without coma: Secondary | ICD-10-CM | POA: Diagnosis not present

## 2023-02-06 DIAGNOSIS — E669 Obesity, unspecified: Secondary | ICD-10-CM | POA: Diagnosis present

## 2023-02-06 DIAGNOSIS — Z7985 Long-term (current) use of injectable non-insulin antidiabetic drugs: Secondary | ICD-10-CM

## 2023-02-06 DIAGNOSIS — I509 Heart failure, unspecified: Secondary | ICD-10-CM

## 2023-02-06 DIAGNOSIS — R0600 Dyspnea, unspecified: Secondary | ICD-10-CM | POA: Diagnosis not present

## 2023-02-06 DIAGNOSIS — K5900 Constipation, unspecified: Secondary | ICD-10-CM | POA: Diagnosis not present

## 2023-02-06 DIAGNOSIS — I2510 Atherosclerotic heart disease of native coronary artery without angina pectoris: Secondary | ICD-10-CM | POA: Diagnosis not present

## 2023-02-06 DIAGNOSIS — E1165 Type 2 diabetes mellitus with hyperglycemia: Secondary | ICD-10-CM | POA: Diagnosis present

## 2023-02-06 DIAGNOSIS — Z8673 Personal history of transient ischemic attack (TIA), and cerebral infarction without residual deficits: Secondary | ICD-10-CM

## 2023-02-06 DIAGNOSIS — I5043 Acute on chronic combined systolic (congestive) and diastolic (congestive) heart failure: Secondary | ICD-10-CM | POA: Diagnosis present

## 2023-02-06 DIAGNOSIS — Z823 Family history of stroke: Secondary | ICD-10-CM

## 2023-02-06 DIAGNOSIS — I495 Sick sinus syndrome: Secondary | ICD-10-CM | POA: Diagnosis not present

## 2023-02-06 DIAGNOSIS — Z9981 Dependence on supplemental oxygen: Secondary | ICD-10-CM

## 2023-02-06 DIAGNOSIS — D649 Anemia, unspecified: Secondary | ICD-10-CM | POA: Diagnosis not present

## 2023-02-06 DIAGNOSIS — Z833 Family history of diabetes mellitus: Secondary | ICD-10-CM | POA: Diagnosis not present

## 2023-02-06 DIAGNOSIS — Z79899 Other long term (current) drug therapy: Secondary | ICD-10-CM

## 2023-02-06 DIAGNOSIS — E871 Hypo-osmolality and hyponatremia: Secondary | ICD-10-CM | POA: Diagnosis not present

## 2023-02-06 DIAGNOSIS — R140 Abdominal distension (gaseous): Secondary | ICD-10-CM | POA: Diagnosis not present

## 2023-02-06 DIAGNOSIS — E113393 Type 2 diabetes mellitus with moderate nonproliferative diabetic retinopathy without macular edema, bilateral: Secondary | ICD-10-CM | POA: Diagnosis not present

## 2023-02-06 DIAGNOSIS — Z6833 Body mass index (BMI) 33.0-33.9, adult: Secondary | ICD-10-CM

## 2023-02-06 DIAGNOSIS — I5023 Acute on chronic systolic (congestive) heart failure: Secondary | ICD-10-CM | POA: Diagnosis not present

## 2023-02-06 DIAGNOSIS — Z87891 Personal history of nicotine dependence: Secondary | ICD-10-CM

## 2023-02-06 DIAGNOSIS — Z888 Allergy status to other drugs, medicaments and biological substances status: Secondary | ICD-10-CM

## 2023-02-06 DIAGNOSIS — I10 Essential (primary) hypertension: Secondary | ICD-10-CM | POA: Diagnosis present

## 2023-02-06 DIAGNOSIS — R079 Chest pain, unspecified: Secondary | ICD-10-CM | POA: Diagnosis not present

## 2023-02-06 DIAGNOSIS — Z7982 Long term (current) use of aspirin: Secondary | ICD-10-CM

## 2023-02-06 DIAGNOSIS — N179 Acute kidney failure, unspecified: Secondary | ICD-10-CM | POA: Diagnosis present

## 2023-02-06 DIAGNOSIS — I5033 Acute on chronic diastolic (congestive) heart failure: Secondary | ICD-10-CM | POA: Diagnosis not present

## 2023-02-06 DIAGNOSIS — E611 Iron deficiency: Secondary | ICD-10-CM | POA: Diagnosis present

## 2023-02-06 LAB — COMPREHENSIVE METABOLIC PANEL
ALT: 18 U/L (ref 0–44)
AST: 17 U/L (ref 15–41)
Albumin: 2.9 g/dL — ABNORMAL LOW (ref 3.5–5.0)
Alkaline Phosphatase: 72 U/L (ref 38–126)
Anion gap: 14 (ref 5–15)
BUN: 23 mg/dL (ref 8–23)
CO2: 26 mmol/L (ref 22–32)
Calcium: 8.8 mg/dL — ABNORMAL LOW (ref 8.9–10.3)
Chloride: 96 mmol/L — ABNORMAL LOW (ref 98–111)
Creatinine, Ser: 1.24 mg/dL — ABNORMAL HIGH (ref 0.44–1.00)
GFR, Estimated: 48 mL/min — ABNORMAL LOW (ref >60)
Glucose, Bld: 246 mg/dL — ABNORMAL HIGH (ref 70–99)
Potassium: 3.4 mmol/L — ABNORMAL LOW (ref 3.5–5.1)
Sodium: 136 mmol/L (ref 135–145)
Total Bilirubin: 1.6 mg/dL — ABNORMAL HIGH (ref 0.3–1.2)
Total Protein: 6.6 g/dL (ref 6.5–8.1)

## 2023-02-06 LAB — CBC WITH DIFFERENTIAL/PLATELET
Abs Immature Granulocytes: 0.04 10*3/uL (ref 0.00–0.07)
Basophils Absolute: 0 10*3/uL (ref 0.0–0.1)
Basophils Relative: 0 %
Eosinophils Absolute: 0.2 10*3/uL (ref 0.0–0.5)
Eosinophils Relative: 3 %
HCT: 30.3 % — ABNORMAL LOW (ref 36.0–46.0)
Hemoglobin: 9.6 g/dL — ABNORMAL LOW (ref 12.0–15.0)
Immature Granulocytes: 0 %
Lymphocytes Relative: 11 %
Lymphs Abs: 1 10*3/uL (ref 0.7–4.0)
MCH: 26.1 pg (ref 26.0–34.0)
MCHC: 31.7 g/dL (ref 30.0–36.0)
MCV: 82.3 fL (ref 80.0–100.0)
Monocytes Absolute: 0.6 10*3/uL (ref 0.1–1.0)
Monocytes Relative: 6 %
Neutro Abs: 7.2 10*3/uL (ref 1.7–7.7)
Neutrophils Relative %: 80 %
Platelets: 246 10*3/uL (ref 150–400)
RBC: 3.68 MIL/uL — ABNORMAL LOW (ref 3.87–5.11)
RDW: 16.6 % — ABNORMAL HIGH (ref 11.5–15.5)
WBC: 9 10*3/uL (ref 4.0–10.5)
nRBC: 0.2 % (ref 0.0–0.2)

## 2023-02-06 LAB — GLUCOSE, CAPILLARY: Glucose-Capillary: 293 mg/dL — ABNORMAL HIGH (ref 70–99)

## 2023-02-06 LAB — I-STAT CHEM 8, ED
BUN: 23 mg/dL (ref 8–23)
Calcium, Ion: 1.1 mmol/L — ABNORMAL LOW (ref 1.15–1.40)
Chloride: 97 mmol/L — ABNORMAL LOW (ref 98–111)
Creatinine, Ser: 1.3 mg/dL — ABNORMAL HIGH (ref 0.44–1.00)
Glucose, Bld: 245 mg/dL — ABNORMAL HIGH (ref 70–99)
HCT: 31 % — ABNORMAL LOW (ref 36.0–46.0)
Hemoglobin: 10.5 g/dL — ABNORMAL LOW (ref 12.0–15.0)
Potassium: 3.4 mmol/L — ABNORMAL LOW (ref 3.5–5.1)
Sodium: 137 mmol/L (ref 135–145)
TCO2: 29 mmol/L (ref 22–32)

## 2023-02-06 LAB — RESP PANEL BY RT-PCR (RSV, FLU A&B, COVID)  RVPGX2
Influenza A by PCR: NEGATIVE
Influenza B by PCR: NEGATIVE
Resp Syncytial Virus by PCR: NEGATIVE
SARS Coronavirus 2 by RT PCR: NEGATIVE

## 2023-02-06 LAB — TROPONIN I (HIGH SENSITIVITY)
Troponin I (High Sensitivity): 23 ng/L — ABNORMAL HIGH (ref <18)
Troponin I (High Sensitivity): 26 ng/L — ABNORMAL HIGH (ref <18)

## 2023-02-06 LAB — BRAIN NATRIURETIC PEPTIDE: B Natriuretic Peptide: 1437.4 pg/mL — ABNORMAL HIGH (ref 0.0–100.0)

## 2023-02-06 LAB — MAGNESIUM: Magnesium: 1.6 mg/dL — ABNORMAL LOW (ref 1.7–2.4)

## 2023-02-06 LAB — LIPASE, BLOOD: Lipase: 27 U/L (ref 11–51)

## 2023-02-06 MED ORDER — ASPIRIN 81 MG PO CHEW
81.0000 mg | CHEWABLE_TABLET | Freq: Every day | ORAL | Status: DC
  Administered 2023-02-07 – 2023-02-11 (×5): 81 mg via ORAL
  Filled 2023-02-06 (×5): qty 1

## 2023-02-06 MED ORDER — SODIUM CHLORIDE 0.9% FLUSH: 3.0000 mL | INTRAVENOUS | Status: DC | PRN

## 2023-02-06 MED ORDER — MOMETASONE FURO-FORMOTEROL FUM 100-5 MCG/ACT IN AERO
2.0000 | INHALATION_SPRAY | Freq: Two times a day (BID) | RESPIRATORY_TRACT | Status: DC
  Administered 2023-02-07 – 2023-02-11 (×8): 2 via RESPIRATORY_TRACT
  Filled 2023-02-06: qty 8.8

## 2023-02-06 MED ORDER — POTASSIUM CHLORIDE 10 MEQ/100ML IV SOLN: 10.0000 meq | INTRAVENOUS | Status: DC

## 2023-02-06 MED ORDER — SODIUM CHLORIDE 0.9 % IV SOLN: 250.0000 mL | INTRAVENOUS | Status: DC | PRN

## 2023-02-06 MED ORDER — INSULIN ASPART 100 UNIT/ML IJ SOLN
0.0000 [IU] | Freq: Three times a day (TID) | INTRAMUSCULAR | Status: DC
  Administered 2023-02-07: 3 [IU] via SUBCUTANEOUS
  Administered 2023-02-07 – 2023-02-09 (×6): 5 [IU] via SUBCUTANEOUS
  Administered 2023-02-09: 3 [IU] via SUBCUTANEOUS
  Administered 2023-02-09: 8 [IU] via SUBCUTANEOUS
  Administered 2023-02-10 (×3): 3 [IU] via SUBCUTANEOUS
  Administered 2023-02-11: 2 [IU] via SUBCUTANEOUS
  Administered 2023-02-11: 5 [IU] via SUBCUTANEOUS

## 2023-02-06 MED ORDER — ATORVASTATIN CALCIUM 40 MG PO TABS
80.0000 mg | ORAL_TABLET | Freq: Every day | ORAL | Status: DC
  Administered 2023-02-07 – 2023-02-11 (×5): 80 mg via ORAL
  Filled 2023-02-06 (×5): qty 2

## 2023-02-06 MED ORDER — INSULIN GLARGINE-YFGN 100 UNIT/ML ~~LOC~~ SOLN
20.0000 [IU] | Freq: Every day | SUBCUTANEOUS | Status: DC
  Administered 2023-02-06 – 2023-02-07 (×2): 20 [IU] via SUBCUTANEOUS
  Filled 2023-02-06 (×3): qty 0.2

## 2023-02-06 MED ORDER — DAPAGLIFLOZIN PROPANEDIOL 10 MG PO TABS
10.0000 mg | ORAL_TABLET | Freq: Every day | ORAL | Status: DC
  Administered 2023-02-07: 10 mg via ORAL
  Filled 2023-02-06 (×2): qty 1

## 2023-02-06 MED ORDER — FUROSEMIDE 10 MG/ML IJ SOLN
80.0000 mg | Freq: Once | INTRAMUSCULAR | Status: AC
  Administered 2023-02-06: 80 mg via INTRAVENOUS
  Filled 2023-02-06: qty 8

## 2023-02-06 MED ORDER — MAGNESIUM SULFATE 2 GM/50ML IV SOLN
2.0000 g | Freq: Once | INTRAVENOUS | Status: AC
  Administered 2023-02-06: 2 g via INTRAVENOUS
  Filled 2023-02-06: qty 50

## 2023-02-06 MED ORDER — ACETAMINOPHEN 325 MG PO TABS: 650.0000 mg | ORAL_TABLET | ORAL | Status: DC | PRN

## 2023-02-06 MED ORDER — EZETIMIBE 10 MG PO TABS
10.0000 mg | ORAL_TABLET | Freq: Every day | ORAL | Status: DC
  Administered 2023-02-07 – 2023-02-11 (×5): 10 mg via ORAL
  Filled 2023-02-06 (×5): qty 1

## 2023-02-06 MED ORDER — POTASSIUM CHLORIDE 10 MEQ/100ML IV SOLN
10.0000 meq | INTRAVENOUS | Status: AC
  Administered 2023-02-06 (×2): 10 meq via INTRAVENOUS
  Filled 2023-02-06 (×2): qty 100

## 2023-02-06 MED ORDER — SPIRONOLACTONE 12.5 MG HALF TABLET
25.0000 mg | ORAL_TABLET | Freq: Every day | ORAL | Status: DC
  Administered 2023-02-06 – 2023-02-11 (×6): 25 mg via ORAL
  Filled 2023-02-06 (×6): qty 2

## 2023-02-06 MED ORDER — ASPIRIN 81 MG PO TABS: 81.0000 mg | ORAL_TABLET | Freq: Every day | ORAL | Status: DC

## 2023-02-06 MED ORDER — AMLODIPINE BESYLATE 5 MG PO TABS
10.0000 mg | ORAL_TABLET | Freq: Every day | ORAL | Status: DC
  Administered 2023-02-06 – 2023-02-11 (×6): 10 mg via ORAL
  Filled 2023-02-06 (×6): qty 2

## 2023-02-06 MED ORDER — ONDANSETRON HCL 4 MG/2ML IJ SOLN
4.0000 mg | Freq: Once | INTRAMUSCULAR | Status: AC
  Administered 2023-02-06: 4 mg via INTRAVENOUS
  Filled 2023-02-06: qty 2

## 2023-02-06 MED ORDER — SODIUM CHLORIDE 0.9% FLUSH
3.0000 mL | Freq: Two times a day (BID) | INTRAVENOUS | Status: DC
  Administered 2023-02-07 – 2023-02-11 (×7): 3 mL via INTRAVENOUS

## 2023-02-06 MED ORDER — POTASSIUM CHLORIDE 20 MEQ PO PACK
40.0000 meq | PACK | Freq: Once | ORAL | Status: AC
  Administered 2023-02-06: 40 meq via ORAL
  Filled 2023-02-06: qty 2

## 2023-02-06 MED ORDER — RIVAROXABAN 10 MG PO TABS
10.0000 mg | ORAL_TABLET | Freq: Every day | ORAL | Status: DC
  Administered 2023-02-06 – 2023-02-11 (×6): 10 mg via ORAL
  Filled 2023-02-06 (×6): qty 1

## 2023-02-06 MED ORDER — SACUBITRIL-VALSARTAN 97-103 MG PO TABS
1.0000 | ORAL_TABLET | Freq: Two times a day (BID) | ORAL | Status: DC
  Administered 2023-02-06 – 2023-02-11 (×10): 1 via ORAL
  Filled 2023-02-06 (×11): qty 1
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
## 2023-02-06 NOTE — H&P (Signed)
Date: 02/06/2023               Patient Name:  Alicia [name] MRN: [id number]  DOB: October 09, 1956 Age / Sex: 67 y.o., female   PCP: Axel Filler, MD         Medical Service: Internal Medicine Teaching Service         Attending Physician: Dr. Charise Killian, MD      First Contact: Dr. Nani Gasser, MD Pager [phone]    Second Contact: Dr. Christiana Fuchs, DO Pager [phone]         After Hours (After 5p/  First Contact Pager: [phone]  weekends / holidays): Second Contact Pager: [phone]   SUBJECTIVE  Chief Complaint: shortness of breath, chills, n/v  History of Present Illness: This person is a 67 year old with a history of systolic heart failure with EF recovery and chronic respiratory failure on 4 LPM O2 who presents for dyspnea on exertion, orthopnea, and exertional chest pain for a week. On 02/01/23 she was feeling well. Visited PCP who made insulin adjustments. That night she had nausea, vomiting, and diarrhea that resolved over a day or so. She was thirsty as a result and thinks she drank more fluids than usual afterwards. Symptoms notable for worsening dyspnea on exertion. She is sleeping on an extra pillow. She notes abdominal discomfort, bloating, and increased girth. She has some central chest discomfort, worse with exertion, that is unusual for her. She also reports urinary hesitancy, describing the sensation as one of having to urinate but not being able to. However, she notes that she seems to making a normal volume of urine. At the time of this interview she is comfortable sitting upright without chest pain.  In ED, EKG was reassuring. CXR showed pulmonary edema. Troponin flat in the 20s. She is noted to desaturate quickly to 78% when off oxygen, but maintains saturation around 90% with 4 LPM via [place].  She was recently seen in [facility] 3/11 due to episodes of hypoglycemia that have improved with changes to insulin. Her weight at that time was around 100 kg. Notably 97 kg after  hospitalization for heart failure in December.   Medications: No current facility-administered medications on file prior to encounter.   Current Outpatient Medications on File Prior to Encounter  Medication Sig Dispense Refill   ACCU-CHEK AVIVA PLUS test strip USE TO TEST BLOOD SUGARS AS DIRECTED 100 strip 12   albuterol (VENTOLIN HFA) 108 (90 Base) MCG/ACT inhaler TAKE 2 PUFFS BY MOUTH EVERY 6 HOURS AS NEEDED 18 each 5   amLODipine (NORVASC) 10 MG tablet TAKE 1 TABLET BY MOUTH EVERY DAY 90 tablet 3   aspirin 81 MG tablet Take 81 mg by mouth daily.     atorvastatin (LIPITOR) 80 MG tablet Take 1 tablet (80 mg total) by mouth daily. 90 tablet 3   B-D UF III MINI PEN NEEDLES 31G X 5 MM MISC USE 3 TIMES A DAY WITH HUMALOG 100 each 5   dapagliflozin propanediol (FARXIGA) 10 MG TABS tablet Take 1 tablet (10 mg total) by mouth daily before breakfast. 90 tablet 3   Dulaglutide (TRULICITY) A999333 0000000 SOPN Inject 0.75 mg into the skin once a week. 6 mL 1   ezetimibe (ZETIA) 10 MG tablet Take 1 tablet (10 mg total) by mouth daily. 90 tablet 3   furosemide (LASIX) 40 MG tablet Take 2 tablets (80 mg) by mouth in the AM and 1 tablet (40 mg) by mouth in the  PM 270 tablet 3   insulin glargine (LANTUS) 100 UNIT/ML injection Inject 0.2 mLs (20 Units total) into the skin at bedtime. 10 mL 3   insulin lispro (HUMALOG) 100 UNIT/ML KwikPen Inject 14 units before breakfast and lunch, inject 20 units before dinner 15 mL 11   Insulin Pen Needle 32G X 4 MM MISC Use to inject insulin 4 times a day. The patient is insulin requiring, ICD 10 code 11.10. The patient injects 4 times per day. 400 each 3   Insulin Syringe-Needle U-100 31G X 15/64" 0.3 ML MISC Use to inject Humalog before meals three times a day 100 each 5   Lancets (ACCU-CHEK MULTICLIX) lancets Use to check your blood sugar four times daily: early morning, before a meal, two hours after a meal, and bedtime 100 each 12   mometasone-formoterol (DULERA) 100-5  MCG/ACT AERO Inhale 2 puffs into the lungs 2 (two) times daily. 13 g 3   nystatin powder Apply 1 Application topically 3 (three) times daily. 15 g 2   sacubitril-valsartan (ENTRESTO) 97-103 MG Take 1 tablet by mouth 2 (two) times daily. 180 tablet 3   senna (SENOKOT) 8.6 MG TABS tablet Take 2 tablets (17.2 mg total) by mouth daily. 120 tablet 2   spironolactone (ALDACTONE) 25 MG tablet Take 1 tablet (25 mg total) by mouth daily. 90 tablet 3   triamcinolone (KENALOG) 0.025 % cream Apply 1 application topically 2 (two) times daily. 30 g 2   [DISCONTINUED] insulin aspart (NOVOLOG) 100 UNIT/ML injection Inject 4.5 Units into the skin at bedtime. 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  Non-functioning   Aortic atherosclerosis ([place]) 09/24/2017   Asymptomatic, found on CT scan   Blood transfusion without reported diagnosis    Chronic Systolic Heart Failure 123456   Felt to be non-ischemic and secondary to hypertension.  Echo (05/28/2014): LVEF 25%.  Is not interested in AICD placement.   COPD exacerbation ([place])    Coronary artery disease involving native coronary artery of native heart without angina pectoris 11/09/2014   Cardiac cath (02/18/2014): Non-obstructive, mRCA 30%, dRCA 60%   Cystocele with uterine prolapse - grade 3 02/12/2016   Not interested in pessary after trying   Diverticulosis of colon 09/24/2017   Diverticulosis of colon 09/24/2017   Essential hypertension 10/15/2013   Gastroesophageal reflux disease 09/24/2017   History of cerebrovascular accident 05/10/2013   Per patient report 6 previous strokes, most recent on 05/10/13.  No residual deficits.   History of cerebrovascular accident 05/10/2013   Per  patient report 6 previous strokes, most recent on 05/10/13.  No residual deficits.   Hyperlipidemia    Overweight (BMI 25.0-29.9) 09/24/2017   Psoriasis 09/24/2017   Seasonal allergic rhinitis due to pollen 09/24/2017   Spring and early Fall   Small Bowel Obstruction (SBO) 01/13/2014   Ex-Lap & Lysis of Adhesion, 01/15/2014   Thyroid nodule 09/04/2019   Tobacco use disorder 01/15/2014   Type 2 diabetes mellitus with moderate nonproliferative diabetic retinopathy ([place]) 10/15/2013    Social:  Lives - with daughter, moves well around [employer] Occupation - retired Research officer, trade union - good family support Level of function - independent PCP -  Dr. Evette Doffing Substance use - former smoker, no alcohol, no illicit drugs  Family History: Family History  Problem Relation Age of Onset   Diabetes Mellitus II Mother    Hypertension Mother    Cerebrovascular Accident Mother 29       Massive, resulted in death   Pulmonary embolism Father 22       Resulted in sudden death   Heart failure Brother    Obesity Brother    Coronary artery disease Brother    Obesity Brother    COPD Brother    Diabetes Mellitus II Brother    Publishing copy    Healthy Daughter    Healthy Son    Healthy Son     Allergies: Allergies as of 02/06/2023 - Review Complete 02/06/2023  Allergen Reaction Noted   Beta adrenergic blockers Other (See Comments) 10/24/2018   Canagliflozin Itching, Anxiety, and Palpitations 08/12/2018    Review of Systems: A complete ROS was negative except as per HPI.   OBJECTIVE:  Physical Exam: Blood pressure 139/60, pulse (!) 58, temperature 97.6 F (36.4 C), temperature source Oral, resp. rate (!) 24, height 5\' 7"  (1.702 m), weight 100.2 kg, SpO2 94 %. General: well-appearing Pulm: breathing comfortably on home O2 4 L, no crackles present to lungs bilaterally Cardiac: Regular rate and rhythm, No m/r/g Abd: distended, non tender MSK: no lower extremity edema  present Skin: warm and dry Psych: pleasant appropriate  Pertinent Labs: CBC    Component Value Date/Time   WBC 9.0 02/06/2023 1331   RBC 3.68 (L) 02/06/2023 1331   HGB 10.5 (L) 02/06/2023 1347   HGB 13.5 08/15/2019 1124   HCT 31.0 (L) 02/06/2023 1347   HCT 42.5 08/15/2019 1124   PLT 246 02/06/2023 1331   PLT 258 08/15/2019 1124   MCV 82.3 02/06/2023 1331   MCV 83 08/15/2019 1124   MCH 26.1 02/06/2023 1331   MCHC 31.7 02/06/2023 1331   RDW 16.6 (H) 02/06/2023 1331   RDW 15.9 (H) 08/15/2019 1124   LYMPHSABS 1.0 02/06/2023 1331   MONOABS 0.6 02/06/2023 1331   EOSABS 0.2 02/06/2023 1331   BASOSABS 0.0 02/06/2023 1331     CMP     Component Value Date/Time   NA 137 02/06/2023 1347   NA 136 12/22/2022 1151   K 3.4 (L) 02/06/2023 1347   CL 97 (L) 02/06/2023 1347   CO2 26 02/06/2023 1331   GLUCOSE 245 (H) 02/06/2023 1347   BUN 23 02/06/2023 1347   BUN 22 12/22/2022 1151   CREATININE 1.30 (H) 02/06/2023 1347   CREATININE 0.63 08/28/2015 0926   CALCIUM 8.8 (L) 02/06/2023 1331   PROT 6.6 02/06/2023 1331   PROT 6.7 08/15/2019 1124   ALBUMIN 2.9 (L) 02/06/2023 1331   ALBUMIN 3.9 08/15/2019 1124   AST 17 02/06/2023 1331   ALT 18 02/06/2023 1331   ALKPHOS 72 02/06/2023 1331   BILITOT 1.6 (H) 02/06/2023 1331   BILITOT 0.5 08/15/2019 1124   GFRNONAA 48 (L) 02/06/2023 1331   GFRNONAA >89 02/27/2014 1149   GFRAA 108 02/19/2020 0935   GFRAA >89 02/27/2014 1149   Troponin at 23 to 26  Pertinent Imaging: DG Chest 2 View  Result Date: 02/06/2023 CLINICAL DATA:  Chest pain and dyspnea as well as upper abdominal pain. EXAM: CHEST - 2 VIEW COMPARISON:  10/11/2022 FINDINGS: Lungs are adequately inflated with hazy bibasilar opacification with small amount of bilateral pleural fluid. Mild prominence of the central pulmonary vessels.  Findings may be due to mild CHF/interstitial edema, although infection in the lung bases is possible. Mild stable cardiomegaly. Remainder of the exam is  unchanged. IMPRESSION: Hazy bibasilar opacification with small amount of bilateral pleural fluid. Findings may be due to mild CHF/interstitial edema, although infection in the lung bases is possible. Electronically Signed   By: Marin Olp M.D.   On: 02/06/2023 14:30    EKG:   ASSESSMENT & PLAN:  Assessment: Active Problems:   Acute exacerbation of CHF (congestive heart failure) ([place])   Alicia [name] is a 67 y.o. with pertinent PMH of T2DM, HTN, and advanced COPD on 4 LNC who presented with shortness of breath and admit for heart failure exacerbation on hospital day 0  Plan: Acute on chronic HFpEF exacerbation (EF 55-60%) Patient presenting with several day history of worsening shortness of breath. ED evaluation showed BNP of 1000 with CXR showing hazy bibasiliar opacification. Extremities are well perfused, she is saturating well on home oxygen, no concern for low output heart failure. December weight was 97 kg following admission for HFpEF. Weight at 100 kg in ED, which is about 3 kg above dry weight. She had been taking lasix 80 mg BID, although last cardiology note 1/24 mentions dose changing to lasix 80 mg in morning and 40 mg at night due to orthostatic intolerance. She did have recent GI illness in which she was drinking more fluids due to diarrhea. Last echo was 11/23 and showed EF 55-60%. -IV lasix 80 mg once -repeat Echo -PT/ OT eval and treat -GDMT with entresto, spironolactone, farxiga restarted -she does not take beta blocker in setting of COPD -trend BMP -electrolytes with K>4 and Mg>2  AKI Creatinine above baseline of 0.7 at 1.2. Likely cardiorenal in setting of fluid overload.  -UA -stricts I and Os -bladder scan  Dysuria She also noted increased urge without no dysuria, with recent history of chills.  -UA  Chest pain She has been having central chest pain that is sharp with rest and exertion. Troponin 26 to 23 with no ischemic changes on EKG. CT coronary 8/23  showed moderate diffuse and small vessel disease.  Chronic respiratory failure 2/2 to COPD on 4 L [place] [facility] inhaler is dulera. O2 saturations stable on 4L [place]. -continue inhaler -O2 goal of >88%  T2DM A1c at 8.7 5 days ago. Insulin recently titrated down with hypoglycemia episodes at night. Home regimen includes lantus 20 units and novolog 14 units breakfast and lunch, 20 units before dinner, trulicity 1.5 mg weekly, and farxiga 10 mg daily. -continue lantus 20 units -SSI with decreased appetite -follow CGBs  HTN -continue amlodipine, entresto 200 mg BID, and spironolactone   HLD Lipid panel in 1/24 with LDL 87. If lipid panel remains elevated in 3 months plan for PSK9 inhibitor. -atorvastatin 80 mg -zetia 10 mg   Best Practice: Diet: carb modified IVF: none VTE: rivaroxaban (XARELTO) tablet 10 mg Start: 02/06/23 1645 Code: Full Status: Inpatient with expected length of stay greater than 2 midnights. Anticipated Discharge Location: Home Barriers to Discharge: treat fluid overload  Signature: Alicia [name], D.O.  Internal Medicine Resident, PGY-2 [facility]  Pager: [phone] 6:27 PM, 02/06/2023   Please contact the on call pager after 5 pm and on weekends at [phone].

## 2023-02-06 NOTE — ED Provider Notes (Signed)
[facility] Provider Note   CSN: [id number] Arrival date & time: 02/06/23  1257     History  Chief Complaint  Patient presents with   Shortness of Breath    Alicia [name] is a 67 y.o. female.  67 year old female with past medical history significant for CHF, COPD on baseline 4 L of oxygen who presents today for worsening exertional dyspnea, orthopnea over the past few weeks however worse over the past few days.  She denies any recent illness.  Denies fever.  Endorses decreased appetite, abdominal distention.  Denies nausea, vomiting.  Reports compliance with her home diuretic.  Denies any salty meals.  The history is provided by the patient. No language interpreter was used.       Home Medications Prior to Admission medications   Medication Sig Start Date End Date Taking? Authorizing Provider  ACCU-CHEK AVIVA PLUS test strip USE TO TEST BLOOD SUGARS AS DIRECTED 10/26/22   Axel Filler, MD  albuterol (VENTOLIN HFA) 108 (90 Base) MCG/ACT inhaler TAKE 2 PUFFS BY MOUTH EVERY 6 HOURS AS NEEDED 01/12/23   Spero Geralds, MD  amLODipine (NORVASC) 10 MG tablet TAKE 1 TABLET BY MOUTH EVERY DAY 05/11/22   Axel Filler, MD  aspirin 81 MG tablet Take 81 mg by mouth daily.    [provider]  atorvastatin (LIPITOR) 80 MG tablet Take 1 tablet (80 mg total) by mouth daily. 06/25/22   Swinyer, Lanice Schwab, NP  B-D UF III MINI PEN NEEDLES 31G X 5 MM MISC USE 3 TIMES A DAY WITH HUMALOG 08/10/22   Axel Filler, MD  dapagliflozin propanediol (FARXIGA) 10 MG TABS tablet Take 1 tablet (10 mg total) by mouth daily before breakfast. 10/28/22   Lyda Jester M, PA-C  Dulaglutide (TRULICITY) [id number] [id number] SOPN Inject 0.75 mg into the skin once a week. 12/22/22   Axel Filler, MD  ezetimibe (ZETIA) 10 MG tablet Take 1 tablet (10 mg total) by mouth daily. 09/09/22   Swinyer, Lanice Schwab, NP  furosemide (LASIX) 40 MG  tablet Take 2 tablets (80 mg) by mouth in the AM and 1 tablet (40 mg) by mouth in the PM 12/22/22   Richardson Dopp T, PA-C  insulin glargine (LANTUS) 100 UNIT/ML injection Inject 0.2 mLs (20 Units total) into the skin at bedtime. 02/01/23   Axel Filler, MD  insulin lispro (HUMALOG) 100 UNIT/ML KwikPen Inject 14 units before breakfast and lunch, inject 20 units before dinner 02/01/23   Axel Filler, MD  Insulin Pen Needle 32G X 4 MM MISC Use to inject insulin 4 times a day. The patient is insulin requiring, ICD 10 code 11.10. The patient injects 4 times per day. 04/28/18   Oval Linsey, MD  Insulin Syringe-Needle U-100 31G X 15/64" 0.3 ML MISC Use to inject Humalog before meals three times a day 05/31/19   Axel Filler, MD  Lancets (ACCU-CHEK MULTICLIX) lancets Use to check your blood sugar four times daily: early morning, before a meal, two hours after a meal, and bedtime 01/16/21   Axel Filler, MD  mometasone-formoterol (DULERA) 100-5 MCG/ACT AERO Inhale 2 puffs into the lungs 2 (two) times daily. 11/09/22 12/09/22  Axel Filler, MD  nystatin powder Apply 1 Application topically 3 (three) times daily. 08/17/22   Axel Filler, MD  sacubitril-valsartan (ENTRESTO) 97-103 MG Take 1 tablet by mouth 2 (two) times daily. 11/19/22   Axel Filler, MD  senna (SENOKOT) 8.6 MG TABS tablet Take 2 tablets (17.2 mg total) by mouth daily. 04/14/21   Axel Filler, MD  spironolactone (ALDACTONE) 25 MG tablet Take 1 tablet (25 mg total) by mouth daily. 12/22/22   Richardson Dopp T, PA-C  triamcinolone (KENALOG) 0.025 % cream Apply 1 application topically 2 (two) times daily. 05/29/19   Axel Filler, MD  insulin aspart (NOVOLOG) 100 UNIT/ML injection Inject 4.5 Units into the skin at bedtime. Prescribed 3 times daily, but takes only once because she is afraid of running out  11/01/13  [provider]      Allergies    Beta adrenergic  blockers and Canagliflozin    Review of Systems   Review of Systems  Constitutional:  Positive for appetite change. Negative for chills and fever.  Respiratory:  Positive for shortness of breath. Negative for cough.   Gastrointestinal:  Positive for abdominal distention and nausea.  Neurological:  Negative for light-headedness.  All other systems reviewed and are negative.   Physical Exam Updated Vital Signs BP 135/70 (BP Location: Right Arm)   Pulse (!) 55   Temp 97.7 F (36.5 C) (Oral)   Resp 16   LMP  (LMP Unknown)   SpO2 (!) 86%  Physical Exam Vitals and nursing note reviewed.  Constitutional:      General: She is not in acute distress.    Appearance: Normal appearance. She is not ill-appearing.  HENT:     Head: Normocephalic and atraumatic.     Nose: Nose normal.  Eyes:     General: No scleral icterus.    Extraocular Movements: Extraocular movements intact.     Conjunctiva/sclera: Conjunctivae normal.  Cardiovascular:     Rate and Rhythm: Normal rate and regular rhythm.     Pulses: Normal pulses.  Pulmonary:     Effort: Pulmonary effort is normal. No respiratory distress.     Breath sounds: Normal breath sounds. No wheezing.  Abdominal:     General: There is distension.     Palpations: Abdomen is soft.     Tenderness: There is no abdominal tenderness. There is no guarding.  Musculoskeletal:        General: Normal range of motion.     Cervical back: Normal range of motion.     Right lower leg: No edema.     Left lower leg: Edema (1+ pitting edema) present.  Skin:    General: Skin is warm and dry.  Neurological:     General: No focal deficit present.     Mental Status: She is alert. Mental status is at baseline.     ED Results / Procedures / Treatments   Labs (all labs ordered are listed, but only abnormal results are displayed) Labs Reviewed  CBC WITH DIFFERENTIAL/PLATELET - Abnormal; Notable for the following components:      Result Value   RBC 3.68  (*)    Hemoglobin 9.6 (*)    HCT 30.3 (*)    RDW 16.6 (*)    All other components within normal limits  COMPREHENSIVE METABOLIC PANEL - Abnormal; Notable for the following components:   Potassium 3.4 (*)    Chloride 96 (*)    Glucose, Bld 246 (*)    Creatinine, Ser 1.24 (*)    Calcium 8.8 (*)    Albumin 2.9 (*)    Total Bilirubin 1.6 (*)    GFR, Estimated 48 (*)    All other components within normal limits  BRAIN NATRIURETIC PEPTIDE -  Abnormal; Notable for the following components:   B Natriuretic Peptide 1,437.4 (*)    All other components within normal limits  MAGNESIUM - Abnormal; Notable for the following components:   Magnesium 1.6 (*)    All other components within normal limits  I-STAT CHEM 8, ED - Abnormal; Notable for the following components:   Potassium 3.4 (*)    Chloride 97 (*)    Creatinine, Ser 1.30 (*)    Glucose, Bld 245 (*)    Calcium, Ion 1.10 (*)    Hemoglobin 10.5 (*)    HCT 31.0 (*)    All other components within normal limits  TROPONIN I (HIGH SENSITIVITY) - Abnormal; Notable for the following components:   Troponin I (High Sensitivity) 23 (*)    All other components within normal limits  RESP PANEL BY RT-PCR (RSV, FLU A&B, COVID)  RVPGX2  LIPASE, BLOOD  URINALYSIS, ROUTINE W REFLEX MICROSCOPIC    EKG None  Radiology DG Chest 2 View  Result Date: 02/06/2023 CLINICAL DATA:  Chest pain and dyspnea as well as upper abdominal pain. EXAM: CHEST - 2 VIEW COMPARISON:  10/11/2022 FINDINGS: Lungs are adequately inflated with hazy bibasilar opacification with small amount of bilateral pleural fluid. Mild prominence of the central pulmonary vessels. Findings may be due to mild CHF/interstitial edema, although infection in the lung bases is possible. Mild stable cardiomegaly. Remainder of the exam is unchanged. IMPRESSION: Hazy bibasilar opacification with small amount of bilateral pleural fluid. Findings may be due to mild CHF/interstitial edema, although  infection in the lung bases is possible. Electronically Signed   By: Marin Olp M.D.   On: 02/06/2023 14:30    Procedures Procedures    Medications Ordered in ED Medications  furosemide (LASIX) injection 80 mg (has no administration in time range)  ondansetron (ZOFRAN) injection 4 mg (4 mg Intravenous Given 02/06/23 1343)    ED Course/ Medical Decision Making/ A&P                             Medical Decision Making Amount and/or Complexity of Data Reviewed Labs: ordered. Radiology: ordered.  Risk Prescription drug management. Decision regarding hospitalization.   Medical Decision Making / ED Course   This patient presents to the ED for concern of shortness of breath, hypoxia, this involves an extensive number of treatment options, and is a complaint that carries with it a high risk of complications and morbidity.  The differential diagnosis includes CHF exacerbation, pneumonia, PE, ACS, viral URI, COPD exacerbation  MDM: 67 year old female with past medical history as above presents today for shortness of breath, hypoxia.  However during my exam patient on 4 L supplemental O2 satting at about 91%.  However she is significantly orthopnea, does have conversational dyspnea.  Reports compliance with her home diuretic regimen.  She states she takes 80 mg Lasix in the morning, 40 at night.  Will obtain labs, chest x-ray.  CBC reveals no leukocytosis, hemoglobin of 9.6.  CMP shows glucose of 246, creatinine of 1.24 otherwise without acute findings.  BNP of 1437.  Chest x-ray does show signs of volume overload.  Initially considered PE study, CT abdomen pelvis however given findings significant for volume overload and acute CHF exacerbation feel this explains patient's shortness of breath, hypoxia, as well as abdominal distention and decreased appetite.  Will discuss with internal medicine service for admission.  Lasix IVP 80 mg ordered.  Lab Tests: -I ordered, reviewed,  and interpreted  labs.   The pertinent results include:   Labs Reviewed  CBC WITH DIFFERENTIAL/PLATELET - Abnormal; Notable for the following components:      Result Value   RBC 3.68 (*)    Hemoglobin 9.6 (*)    HCT 30.3 (*)    RDW 16.6 (*)    All other components within normal limits  COMPREHENSIVE METABOLIC PANEL - Abnormal; Notable for the following components:   Potassium 3.4 (*)    Chloride 96 (*)    Glucose, Bld 246 (*)    Creatinine, Ser 1.24 (*)    Calcium 8.8 (*)    Albumin 2.9 (*)    Total Bilirubin 1.6 (*)    GFR, Estimated 48 (*)    All other components within normal limits  BRAIN NATRIURETIC PEPTIDE - Abnormal; Notable for the following components:   B Natriuretic Peptide 1,437.4 (*)    All other components within normal limits  MAGNESIUM - Abnormal; Notable for the following components:   Magnesium 1.6 (*)    All other components within normal limits  I-STAT CHEM 8, ED - Abnormal; Notable for the following components:   Potassium 3.4 (*)    Chloride 97 (*)    Creatinine, Ser 1.30 (*)    Glucose, Bld 245 (*)    Calcium, Ion 1.10 (*)    Hemoglobin 10.5 (*)    HCT 31.0 (*)    All other components within normal limits  TROPONIN I (HIGH SENSITIVITY) - Abnormal; Notable for the following components:   Troponin I (High Sensitivity) 23 (*)    All other components within normal limits  RESP PANEL BY RT-PCR (RSV, FLU A&B, COVID)  RVPGX2  LIPASE, BLOOD  URINALYSIS, ROUTINE W REFLEX MICROSCOPIC      EKG  EKG Interpretation  Date/Time:    Ventricular Rate:    PR Interval:    QRS Duration:   QT Interval:    QTC Calculation:   R Axis:     Text Interpretation:           Imaging Studies ordered: I ordered imaging studies including cxr I independently visualized and interpreted imaging. I agree with the radiologist interpretation   Medicines ordered and prescription drug management: Meds ordered this encounter  Medications   ondansetron (ZOFRAN) injection 4 mg    furosemide (LASIX) injection 80 mg    -I have reviewed the patients home medicines and have made adjustments as needed  Critical interventions Lasix IVP 80mg    Cardiac Monitoring: The patient was maintained on a cardiac monitor.  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  Non-functioning   Aortic atherosclerosis ([place]) 09/24/2017   Asymptomatic, found on CT scan   Blood transfusion without reported diagnosis    Chronic Systolic Heart Failure 123456   Felt to be non-ischemic and secondary to hypertension.  Echo (05/28/2014): LVEF 25%.  Is not interested in AICD placement.   COPD exacerbation ([place])    Coronary artery disease involving native coronary artery of native heart without angina pectoris 11/09/2014   Cardiac cath (  02/18/2014): Non-obstructive, mRCA 30%, dRCA 60%   Cystocele with uterine prolapse - grade 3 02/12/2016   Not interested in pessary after trying   Diverticulosis of colon 09/24/2017   Diverticulosis of colon 09/24/2017   Essential hypertension 10/15/2013   Gastroesophageal reflux disease 09/24/2017   History of cerebrovascular accident 05/10/2013   Per patient report 6 previous strokes, most recent on 05/10/13.  No residual deficits.   History of cerebrovascular accident 05/10/2013   Per patient report 6 previous strokes, most recent on 05/10/13.  No residual deficits.   Hyperlipidemia    Overweight (BMI 25.0-29.9) 09/24/2017   Psoriasis 09/24/2017   Seasonal allergic rhinitis due to pollen 09/24/2017   Spring and early Fall    Small Bowel Obstruction (SBO) 01/13/2014   Ex-Lap & Lysis of Adhesion, 01/15/2014   Thyroid nodule 09/04/2019   Tobacco use disorder 01/15/2014   Type 2 diabetes mellitus with moderate nonproliferative diabetic retinopathy ([place]) 10/15/2013      Dispostion: Discussed with internal medicine team.  They will admit this patient.  Final Clinical Impression(s) / ED Diagnoses Final diagnoses:  Hypoxia  Acute on chronic congestive heart failure, unspecified heart failure type [facility])    Rx / DC Orders ED Discharge Orders     None         Evlyn Courier, PA-C 02/06/23 [place], [place], DO 02/07/23 [phone]

## 2023-02-06 NOTE — ED Triage Notes (Signed)
Pt reports increased SOB, chills, n/v for the past few weeks. Pt wears 4L [place] at home. Pt arrives to ED 78% on room air, states she ran out of oxygen on the way here. NRB placed on pt.

## 2023-02-06 NOTE — Hospital Course (Addendum)
Principal Problem:   Acute exacerbation of CHF (congestive heart failure) (HCC) Active Problems:   Type 2 diabetes mellitus with moderate nonproliferative diabetic retinopathy (HCC)   Essential hypertension   UTI (urinary tract infection)   Sinus bradycardia   Acute on chronic hypoxic respiratory failure (HCC)  Resolved Problems:   * No resolved hospital problems. *  Consults:***  Procedures:***  Follow-up items: - Pelvic exam - HH OT/PT (F2F placed 3/18) - referral placed - fluids, limiting soda intake   3/20 -breathing is improved -feeling better -will get IV iron

## 2023-02-06 NOTE — ED Notes (Signed)
Pt saturated in urine, The purwick was hooked up to suction but somehow did not catch urine. Pt cleaned up. Peri care completed. New sheet, chuck, and diaper applied.

## 2023-02-07 ENCOUNTER — Inpatient Hospital Stay (HOSPITAL_COMMUNITY): Payer: 59

## 2023-02-07 DIAGNOSIS — Z87891 Personal history of nicotine dependence: Secondary | ICD-10-CM

## 2023-02-07 DIAGNOSIS — R001 Bradycardia, unspecified: Secondary | ICD-10-CM | POA: Diagnosis present

## 2023-02-07 DIAGNOSIS — I110 Hypertensive heart disease with heart failure: Secondary | ICD-10-CM | POA: Diagnosis not present

## 2023-02-07 DIAGNOSIS — I5023 Acute on chronic systolic (congestive) heart failure: Secondary | ICD-10-CM | POA: Diagnosis not present

## 2023-02-07 DIAGNOSIS — I5033 Acute on chronic diastolic (congestive) heart failure: Secondary | ICD-10-CM | POA: Diagnosis not present

## 2023-02-07 DIAGNOSIS — J9621 Acute and chronic respiratory failure with hypoxia: Secondary | ICD-10-CM | POA: Insufficient documentation

## 2023-02-07 LAB — URINALYSIS, ROUTINE W REFLEX MICROSCOPIC
Bilirubin Urine: NEGATIVE
Glucose, UA: >=500 mg/dL — AB
Hgb urine dipstick: NEGATIVE
Ketones, ur: NEGATIVE mg/dL
Nitrite: NEGATIVE
Protein, ur: 100 mg/dL — AB
Specific Gravity, Urine: 1.011 (ref 1.005–1.030)
pH: 5 (ref 5.0–8.0)

## 2023-02-07 LAB — ECHOCARDIOGRAM COMPLETE
Area-P 1/2: 3.17 cm2
Height: 67 in
MV M vel: 3.51 m/s
MV Peak grad: 49.3 mmHg
S' Lateral: 3.2 cm
Weight: 3590.4 oz

## 2023-02-07 LAB — BASIC METABOLIC PANEL
Anion gap: 10 (ref 5–15)
BUN: 24 mg/dL — ABNORMAL HIGH (ref 8–23)
CO2: 29 mmol/L (ref 22–32)
Calcium: 8.5 mg/dL — ABNORMAL LOW (ref 8.9–10.3)
Chloride: 96 mmol/L — ABNORMAL LOW (ref 98–111)
Creatinine, Ser: 1.21 mg/dL — ABNORMAL HIGH (ref 0.44–1.00)
GFR, Estimated: 50 mL/min — ABNORMAL LOW (ref >60)
Glucose, Bld: 293 mg/dL — ABNORMAL HIGH (ref 70–99)
Potassium: 4.5 mmol/L (ref 3.5–5.1)
Sodium: 135 mmol/L (ref 135–145)

## 2023-02-07 LAB — GLUCOSE, CAPILLARY
Glucose-Capillary: 189 mg/dL — ABNORMAL HIGH (ref 70–99)
Glucose-Capillary: 249 mg/dL — ABNORMAL HIGH (ref 70–99)
Glucose-Capillary: 212 mg/dL — ABNORMAL HIGH (ref 70–99)
Glucose-Capillary: 175 mg/dL — ABNORMAL HIGH (ref 70–99)

## 2023-02-07 MED ORDER — FUROSEMIDE 10 MG/ML IJ SOLN: 80.0000 mg | Freq: Every day | INTRAMUSCULAR | Status: DC

## 2023-02-07 MED ORDER — INSULIN ASPART 100 UNIT/ML IJ SOLN
5.0000 [IU] | Freq: Three times a day (TID) | INTRAMUSCULAR | Status: DC
  Administered 2023-02-07 – 2023-02-11 (×10): 5 [IU] via SUBCUTANEOUS

## 2023-02-07 MED ORDER — MAGNESIUM SULFATE 2 GM/50ML IV SOLN
2.0000 g | Freq: Once | INTRAVENOUS | Status: AC
  Administered 2023-02-07: 2 g via INTRAVENOUS
  Filled 2023-02-07: qty 50

## 2023-02-07 MED ORDER — NITROFURANTOIN MONOHYD MACRO 100 MG PO CAPS
100.0000 mg | ORAL_CAPSULE | Freq: Two times a day (BID) | ORAL | Status: DC
  Administered 2023-02-07 – 2023-02-08 (×3): 100 mg via ORAL
  Filled 2023-02-07 (×3): qty 1

## 2023-02-07 MED ORDER — ONDANSETRON HCL 4 MG/2ML IJ SOLN
4.0000 mg | Freq: Three times a day (TID) | INTRAMUSCULAR | Status: DC | PRN
  Administered 2023-02-07 – 2023-02-09 (×2): 4 mg via INTRAVENOUS
  Filled 2023-02-07 (×2): qty 2

## 2023-02-07 MED ORDER — FUROSEMIDE 10 MG/ML IJ SOLN
80.0000 mg | Freq: Two times a day (BID) | INTRAMUSCULAR | Status: DC
  Administered 2023-02-07 (×2): 80 mg via INTRAVENOUS
  Filled 2023-02-07 (×2): qty 8

## 2023-02-07 NOTE — Evaluation (Signed)
Occupational Therapy Evaluation Patient Details Name: Alicia [name] MRN: [id number] DOB: 1956/03/28 Today's Date: 02/07/2023   History of Present Illness 67 yo female presenting 3/16 with SOB and hypoxia to 78% on RA, states she ran out of O2 on her way to ED. Fount to have acute CHF exacerbation and AKI. 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   Clinical Impression   Pt reports independence at baseline with ADLs and functional mobility, lives with daughter/grandchildren who can assist at d/c. Pt currently needing set up-mod A for ADLs, reports difficulty reaching down to "pick things up". Pt supervision for bed mobility, and min guard A for transfers with 1 person HHA. Pt SpO2 above 90% on 6-8L during session. Pt presenting with impairments listed below, will follow acutely. Recommend HHOT at d/c.      Recommendations for follow up therapy are one component of a multi-disciplinary discharge planning process, led by the attending physician.  Recommendations may be updated based on patient status, additional functional criteria and insurance authorization.   Follow Up Recommendations  Home health OT     Assistance Recommended at Discharge Intermittent Supervision/Assistance  Patient can return home with the following A little help with walking and/or transfers;A lot of help with bathing/dressing/bathroom;Assistance with cooking/housework;Direct supervision/assist for financial management;Direct supervision/assist for medications management;Assist for transportation;Help with stairs or ramp for entrance    Functional Status Assessment  Patient has had a recent decline in their functional status and demonstrates the ability to make significant improvements in function in a reasonable and predictable amount of time.  Equipment Recommendations  BSC/3in1;Other (comment) (RW)    Recommendations for Other  Services PT consult     Precautions / Restrictions Precautions Precautions: Fall Precaution Comments: watch SpO2, on 4L at baseline Restrictions Weight Bearing Restrictions: No      Mobility Bed Mobility Overal bed mobility: Needs Assistance Bed Mobility: Supine to Sit, Sit to Supine     Supine to sit: Supervision Sit to supine: Supervision   General bed mobility comments: increased time, SpO2 to 88% on 4L    Transfers Overall transfer level: Needs assistance Equipment used: 1 person hand held assist Transfers: Sit to/from Stand, Bed to chair/wheelchair/BSC Sit to Stand: Min guard     Step pivot transfers: Min guard     General transfer comment: minG with pt able to rise and steady without physical assist      Balance Overall balance assessment: Needs assistance Sitting-balance support: No upper extremity supported, Feet supported Sitting balance-Leahy Scale: Good     Standing balance support: Bilateral upper extremity supported, During functional activity Standing balance-Leahy Scale: Fair                             ADL either performed or assessed with clinical judgement   ADL Overall ADL's : Needs assistance/impaired Eating/Feeding: Set up;Sitting   Grooming: Set up;Sitting;Standing   Upper Body Bathing: Minimal assistance;Sitting   Lower Body Bathing: Moderate assistance;Sitting/lateral leans   Upper Body Dressing : Minimal assistance;Sitting   Lower Body Dressing: Moderate assistance;Sitting/lateral leans   Toilet Transfer: Minimal assistance;Ambulation;Regular Toilet   Toileting- Clothing Manipulation and Hygiene: Minimal assistance;Sitting/lateral lean       Functional mobility during ADLs: Minimal assistance       Vision   Vision Assessment?: No apparent visual deficits     Perception Perception Perception Tested?:  No   Praxis Praxis Praxis tested?: Not tested    Pertinent Vitals/Pain Pain Assessment Pain  Assessment: No/denies pain     Hand Dominance Right   Extremity/Trunk Assessment Upper Extremity Assessment Upper Extremity Assessment: Overall WFL for tasks assessed   Lower Extremity Assessment Lower Extremity Assessment: Defer to PT evaluation   Cervical / Trunk Assessment Cervical / Trunk Assessment: Kyphotic;Other exceptions Cervical / Trunk Exceptions: large body habitus   Communication Communication Communication: HOH   Cognition Arousal/Alertness: Awake/alert Behavior During Therapy: WFL for tasks assessed/performed Overall Cognitive Status: Within Functional Limits for tasks assessed                                       General Comments  VSS on 6-8L O2 with in room mobility    Exercises     Shoulder Instructions      Home Living Family/patient expects to be discharged to:: Private residence Living Arrangements: Children Available Help at Discharge: Family;Available PRN/intermittently Type of Home: House Home Access: Level entry     Home Layout: Two level;Bed/bath upstairs Alternate Level Stairs-Number of Steps: flight Alternate Level Stairs-Rails: Right Bathroom Shower/Tub: Teacher, early years/pre: Standard     Home Equipment: Cane - quad;Grab bars - tub/shower   Additional Comments: lives with daughter who works, 43 and 29 y.o. grandkids who go to school      Prior Functioning/Environment Prior Level of Function : Independent/Modified Independent;Driving             Mobility Comments: ind without AD ADLs Comments: indep with ADLs (sits for dressing due to fear of falling); daughter does majority of cooking, ind with med mgmt        OT Problem List: Decreased strength;Decreased range of motion;Decreased activity tolerance;Impaired balance (sitting and/or standing)      OT Treatment/Interventions: Self-care/ADL training;Therapeutic exercise;Energy conservation;DME and/or AE instruction;Therapeutic  activities;Patient/family education;Balance training    OT Goals(Current goals can be found in the care plan section) Acute Rehab OT Goals Patient Stated Goal: none stated OT Goal Formulation: With patient Time For Goal Achievement: 02/21/23 Potential to Achieve Goals: Good ADL Goals Pt Will Perform Upper Body Dressing: with supervision;sitting Pt Will Perform Lower Body Dressing: with supervision;sitting/lateral leans;sit to/from stand Pt Will Transfer to Toilet: with supervision;ambulating;regular height toilet Pt Will Perform Tub/Shower Transfer: Tub transfer;Shower transfer;with supervision;ambulating  OT Frequency: Min 2X/week    Co-evaluation              AM-PAC OT "6 Clicks" Daily Activity     Outcome Measure Help from another person eating meals?: None Help from another person taking care of personal grooming?: A Little Help from another person toileting, which includes using toliet, bedpan, or urinal?: A Little Help from another person bathing (including washing, rinsing, drying)?: A Little Help from another person to put on and taking off regular upper body clothing?: A Little Help from another person to put on and taking off regular lower body clothing?: A Little 6 Click Score: 19   End of Session Equipment Utilized During Treatment: Gait belt;Oxygen (6-8L) Nurse Communication: Mobility status  Activity Tolerance: Patient tolerated treatment well Patient left: in bed;with call bell/phone within reach;with bed alarm set  OT Visit Diagnosis: Unsteadiness on feet (R26.81);Other abnormalities of gait and mobility (R26.89);Muscle weakness (generalized) (M62.81)                Time: [id number]  OT Time Calculation (min): 24 min Charges:  OT General Charges $OT Visit: 1 Visit OT Evaluation $OT Eval Moderate Complexity: 1 Mod OT Treatments $Self Care/Home Management : 8-22 mins  Renaye Rakers, OTD, OTR/L SecureChat Preferred Acute Rehab [phone]  Renaye Rakers  [place] 02/07/2023, 2:53 PM

## 2023-02-07 NOTE — Evaluation (Signed)
Physical Therapy Evaluation Patient Details Name: Alicia [name] MRN: [id number] DOB: 09-09-1956 Today's Date: 02/07/2023  History of Present Illness  67 yo female presenting 3/16 with SOB and hypoxia to 78% on RA, states she ran out of O2 on her way to ED. Fount to have acute CHF exacerbation and AKI. 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   Clinical Impression  Pt in bed upon arrival of PT, agreeable to evaluation at this time. Prior to admission the pt was ambulating with use of SPC in the home, able to navigate a flight of stairs multiple times daily as her bedroom is on the second floor. The pt now presents with limitations in functional mobility, endurance, and dynamic stability due to above dx, and will continue to benefit from skilled PT to address these deficits. The pt was able to complete multiple sit-stand transfers and short distance ambulation in the room with minG, but required 6L O2 to maintain SpO2 >89. At times SpO2 still dropping to low of 86% briefly after exertion, but recovered quickly after seated rest. Lowest reading was 81% with O2 on 4L while pt sitting EOB to don socks. Required 63min and 8L O2 to recover. Will continue to benefit from skilled PT to progress endurance and complete stair navigation to determine O2 needs prior to return home.         Recommendations for follow up therapy are one component of a multi-disciplinary discharge planning process, led by the attending physician.  Recommendations may be updated based on patient status, additional functional criteria and insurance authorization.  Follow Up Recommendations Home health PT      Assistance Recommended at Discharge Intermittent Supervision/Assistance  Patient can return home with the following  A little help with walking and/or transfers;Help with stairs or ramp for entrance    Equipment Recommendations None  recommended by PT  Recommendations for Other Services       Functional Status Assessment Patient has had a recent decline in their functional status and demonstrates the ability to make significant improvements in function in a reasonable and predictable amount of time.     Precautions / Restrictions Precautions Precautions: Fall Precaution Comments: watch SpO2, on 4L at baseline Restrictions Weight Bearing Restrictions: No      Mobility  Bed Mobility Overal bed mobility: Needs Assistance Bed Mobility: Supine to Sit     Supine to sit: Supervision     General bed mobility comments: increased time, SpO2 to 88% on 4L    Transfers Overall transfer level: Needs assistance Equipment used: Rolling walker (2 wheels) Transfers: Sit to/from Stand, Bed to chair/wheelchair/BSC Sit to Stand: Min guard   Step pivot transfers: Min guard       General transfer comment: minG with pt able to rise and steady without physical assist    Ambulation/Gait Ambulation/Gait assistance: Min guard Gait Distance (Feet): 15 Feet Assistive device: Rolling walker (2 wheels) Gait Pattern/deviations: Step-through pattern, Decreased stride length Gait velocity: decreased Gait velocity interpretation: <1.31 ft/sec, indicative of household ambulator   General Gait Details: slow but steady, limited by SOB, SpO2 to 86% on 6L    Balance Overall balance assessment: Needs assistance Sitting-balance support: No upper extremity supported, Feet supported Sitting balance-Leahy Scale: Good     Standing balance support: Bilateral upper extremity supported, During functional activity Standing balance-Leahy Scale: Fair Standing balance comment: static stand without DME, BUE support for gait.  Pertinent Vitals/Pain Pain Assessment Pain Assessment: No/denies pain    Home Living Family/patient expects to be discharged to:: Private residence Living Arrangements:  Children Available Help at Discharge: Family;Available PRN/intermittently Type of Home: House Home Access: Level entry     Alternate Level Stairs-Number of Steps: flight Home Layout: Two level;Bed/bath upstairs Home Equipment: Cane - quad;Grab bars - tub/shower (oxygen) Additional Comments: lives with daughter who works, 11 and 80 y.o. grandkids who go to school    Prior Function Prior Level of Function : Independent/Modified Independent;Driving             Mobility Comments: typically modI holding onto furniture or cane, has to navigate stairs multiple times/day. ADLs Comments: indep with ADLs (sits for dressing due to fear of falling); daughter does majority of cooking, ind with med mgmt     Hand Dominance   Dominant Hand: Right    Extremity/Trunk Assessment   Upper Extremity Assessment Upper Extremity Assessment: Defer to OT evaluation    Lower Extremity Assessment Lower Extremity Assessment: Generalized weakness (poor endurance)    Cervical / Trunk Assessment Cervical / Trunk Assessment: Kyphotic;Other exceptions Cervical / Trunk Exceptions: large body habitus  Communication   Communication: HOH  Cognition Arousal/Alertness: Awake/alert Behavior During Therapy: WFL for tasks assessed/performed Overall Cognitive Status: Within Functional Limits for tasks assessed                                          General Comments General comments (skin integrity, edema, etc.): SpO2 trialed on 4L but pt desat to 81% on 4L with donning of socks while sitting EOB. then put on 6L for mobility, low of 86% after gait but recovered with seated rest    Exercises     Assessment/Plan    PT Assessment Patient needs continued PT services  PT Problem List Cardiopulmonary status limiting activity;Decreased activity tolerance;Decreased balance       PT Treatment Interventions DME instruction;Stair training;Gait training;Functional mobility training;Therapeutic  activities;Therapeutic exercise;Balance training;Patient/family education    PT Goals (Current goals can be found in the Care Plan section)  Acute Rehab PT Goals Patient Stated Goal: return home PT Goal Formulation: With patient Time For Goal Achievement: 02/21/23 Potential to Achieve Goals: Good    Frequency Min 3X/week        AM-PAC PT "6 Clicks" Mobility  Outcome Measure Help needed turning from your back to your side while in a flat bed without using bedrails?: A Little Help needed moving from lying on your back to sitting on the side of a flat bed without using bedrails?: A Little Help needed moving to and from a bed to a chair (including a wheelchair)?: A Little Help needed standing up from a chair using your arms (e.g., wheelchair or bedside chair)?: A Little Help needed to walk in hospital room?: Total (<20 ft) Help needed climbing 3-5 steps with a railing? : A Lot 6 Click Score: 15    End of Session Equipment Utilized During Treatment: Gait belt;Oxygen Activity Tolerance: Patient tolerated treatment well Patient left: in chair;with call bell/phone within reach;with chair alarm set Nurse Communication: Mobility status PT Visit Diagnosis: Other abnormalities of gait and mobility (R26.89)    Time: [id number] PT Time Calculation (min) (ACUTE ONLY): 40 min   Charges:   PT Evaluation $PT Eval Low Complexity: 1 Low PT Treatments $Gait Training: 8-22 mins $Therapeutic Exercise: 8-22 mins  [place], PT, DPT   Acute Rehabilitation Department Office [phone] Secure Chat Communication Preferred  Sandra Cockayne 02/07/2023, 10:48 AM

## 2023-02-07 NOTE — Progress Notes (Signed)
HD#1 SUBJECTIVE:  Patient Summary: Alicia [name] is a 67 y.o. with a pertinent PMH of systolic heart failure with EF recovery, T2DM, HTN, and advanced COPD on 4 LNC who presented with shortness of breath and admitted for heart failure exacerbation on hospital day 1.  Overnight Events: NAEON. Patient reports that she slept okay and denies any chest pain. She said her breathing today is marginally better and currently is on 6LO2 [place]. She states that her "stomach started churning after breakfast" and feels nauseated. She still has symptoms of urinary hesitancy and reports burning and itching in her perineal area when she goes to the bathroom. No abnormal discharge.    OBJECTIVE:  Vital Signs: Vitals:   02/07/23 0111 02/07/23 0508 02/07/23 0700 02/07/23 1115  BP: 122/60 125/61  (!) 125/57  Pulse: (!) 53 (!) 48  (!) 48  Resp: 20 20  17   Temp: 98.4 F (36.9 C) 98.7 F (37.1 C)  98.7 F (37.1 C)  TempSrc: Oral Oral  Oral  SpO2: 91% 94% 91% 92%  Weight:  101.8 kg    Height:       Supplemental O2: Nasal Cannula SpO2: 92 % O2 Flow Rate (L/min): 6 L/min  Filed Weights   02/06/23 1623 02/06/23 1816 02/07/23 0508  Weight: 100.2 kg 100.2 kg 101.8 kg     Intake/Output Summary (Last 24 hours) at 02/07/2023 1236 Last data filed at 02/07/2023 0758 Gross per 24 hour  Intake 120 ml  Output 400 ml  Net -280 ml   Net IO Since Admission: -280 mL [02/07/23 1236]  Physical Exam: Physical Exam Constitutional:      General: She is not in acute distress.    Appearance: She is obese.  HENT:     Head: Normocephalic and atraumatic.  Neck:     Vascular: JVD present.  Cardiovascular:     Rate and Rhythm: Normal rate and regular rhythm.     Heart sounds: No murmur heard. Pulmonary:     Effort: Pulmonary effort is normal. No respiratory distress.     Breath sounds: Examination of the right-lower field reveals rales. Examination of the left-lower field reveals rales. Rales present. No  wheezing.     Comments: Satting well on 6L O2 [place] Chest:     Chest wall: No tenderness.  Abdominal:     General: Bowel sounds are normal.     Palpations: Abdomen is soft.     Tenderness: There is no abdominal tenderness. There is no guarding.  Musculoskeletal:     Right lower leg: No edema.     Left lower leg: No edema.  Skin:    General: Skin is warm and dry.  Neurological:     General: No focal deficit present.     Mental Status: She is alert.  Psychiatric:        Mood and Affect: Mood normal.        Behavior: Behavior normal.     Patient Lines/Drains/Airways Status     Active Line/Drains/Airways     Name Placement date Placement time Site Days   Peripheral IV 02/06/23 20 G Right Antecubital 02/06/23  1335  Antecubital  1   External Urinary Catheter 02/06/23  1824  --  1   Incision - 4 Ports Abdomen 1: Umbilicus 2: Mid;Upper 3: Right;Upper 4: Right;Lateral 08/02/17  1150  -- 2015            Pertinent Labs:    Latest Ref Rng & Units  02/06/2023    1:47 PM 02/06/2023    1:31 PM 10/13/2022    1:55 AM  CBC  WBC 4.0 - 10.5 K/uL  9.0  11.6   Hemoglobin 12.0 - 15.0 g/dL 10.5  9.6  10.5   Hematocrit 36.0 - 46.0 % 31.0  30.3  33.3   Platelets 150 - 400 K/uL  246  273        Latest Ref Rng & Units 02/07/2023   12:55 AM 02/06/2023    1:47 PM 02/06/2023    1:31 PM  CMP  Glucose 70 - 99 mg/dL 293  245  246   BUN 8 - 23 mg/dL 24  23  23    Creatinine 0.44 - 1.00 mg/dL 1.21  1.30  1.24   Sodium 135 - 145 mmol/L 135  137  136   Potassium 3.5 - 5.1 mmol/L 4.5  3.4  3.4   Chloride 98 - 111 mmol/L 96  97  96   CO2 22 - 32 mmol/L 29   26   Calcium 8.9 - 10.3 mg/dL 8.5   8.8   Total Protein 6.5 - 8.1 g/dL   6.6   Total Bilirubin 0.3 - 1.2 mg/dL   1.6   Alkaline Phos 38 - 126 U/L   72   AST 15 - 41 U/L   17   ALT 0 - 44 U/L   18     Recent Labs    02/06/23 2108 02/07/23 0623 02/07/23 1112  GLUCAP 293* 189* 249*     Pertinent Imaging: ECHOCARDIOGRAM  COMPLETE  Result Date: 02/07/2023    ECHOCARDIOGRAM REPORT   Patient Name:   Alicia [name] Date of Exam: 02/07/2023 Medical Rec #:  [id number]       Height:       67.0 in Accession #:    [id number]      Weight:       224.4 lb Date of Birth:  25-Jan-1956        BSA:          2.124 m Patient Age:    24 years        BP:           139/60 mmHg Patient Gender: F               HR:           53 bpm. Exam Location:  Inpatient Procedure: 2D Echo, 3D Echo, Cardiac Doppler, Color Doppler and Strain Analysis Indications:    CHF  History:        Patient has prior history of Echocardiogram examinations, most                 recent 10/12/2022. CHF, CAD, COPD; Risk Factors:Diabetes,                 Hypertension, Former Smoker and Dyslipidemia.  Sonographer:    Leavy Cella RDCS Referring Phys: [id number] [place]  1. Left ventricular ejection fraction, by estimation, is 60 to 65%. The left ventricle has normal function. The left ventricle has no regional wall motion abnormalities. There is moderate left ventricular hypertrophy. Left ventricular diastolic parameters are consistent with Grade II diastolic dysfunction (pseudonormalization).  2. Right ventricular systolic function is normal. The right ventricular size is normal.  3. Left atrial size was mildly dilated.  4. The mitral valve is normal in structure. Mild mitral valve regurgitation. No evidence of mitral stenosis.  5. The aortic valve  is normal in structure. Aortic valve regurgitation is not visualized. No aortic stenosis is present.  6. The inferior vena cava is normal in size with greater than 50% respiratory variability, suggesting right atrial pressure of 3 mmHg. Comparison(s): No significant change from prior study. Prior images reviewed side by side. FINDINGS  Left Ventricle: Left ventricular ejection fraction, by estimation, is 60 to 65%. The left ventricle has normal function. The left ventricle has no regional wall motion abnormalities. The left  ventricular internal cavity size was normal in size. There is  moderate left ventricular hypertrophy. Left ventricular diastolic parameters are consistent with Grade II diastolic dysfunction (pseudonormalization). Right Ventricle: The right ventricular size is normal. No increase in right ventricular wall thickness. Right ventricular systolic function is normal. Left Atrium: Left atrial size was mildly dilated. Right Atrium: Right atrial size was normal in size. Pericardium: There is no evidence of pericardial effusion. Mitral Valve: The mitral valve is normal in structure. Mild mitral valve regurgitation. No evidence of mitral valve stenosis. Tricuspid Valve: The tricuspid valve is normal in structure. Tricuspid valve regurgitation is mild . No evidence of tricuspid stenosis. Aortic Valve: The aortic valve is normal in structure. Aortic valve regurgitation is not visualized. No aortic stenosis is present. Pulmonic Valve: The pulmonic valve was normal in structure. Pulmonic valve regurgitation is not visualized. No evidence of pulmonic stenosis. Aorta: The aortic root is normal in size and structure. Venous: The inferior vena cava is normal in size with greater than 50% respiratory variability, suggesting right atrial pressure of 3 mmHg. IAS/Shunts: No atrial level shunt detected by color flow Doppler.  LEFT VENTRICLE PLAX 2D LVIDd:         5.80 cm   Diastology LVIDs:         3.20 cm   LV e' medial:    5.98 cm/s LV PW:         1.40 cm   LV E/e' medial:  20.1 LV IVS:        1.40 cm   LV e' lateral:   6.74 cm/s LVOT diam:     1.90 cm   LV E/e' lateral: 17.8 LV SV:         38 LV SV Index:   18 LVOT Area:     2.84 cm                           3D Volume EF:                          3D EF:        55 %                          LV EDV:       168 ml                          LV ESV:       76 ml                          LV SV:        92 ml RIGHT VENTRICLE RV Basal diam:  3.30 cm RV Mid diam:    2.90 cm RV S prime:     12.80  cm/s TAPSE (M-mode): 1.8 cm LEFT ATRIUM  Index        RIGHT ATRIUM           Index LA diam:        4.00 cm 1.88 cm/m   RA Area:     12.90 cm LA Vol (A2C):   84.6 ml 39.84 ml/m  RA Volume:   28.00 ml  13.18 ml/m LA Vol (A4C):   57.5 ml 27.08 ml/m LA Biplane Vol: 69.9 ml 32.92 ml/m  AORTIC VALVE LVOT Vmax:   69.70 cm/s LVOT Vmean:  50.900 cm/s LVOT VTI:    0.134 m  AORTA Ao Root diam: 3.20 cm Ao Asc diam:  3.20 cm MITRAL VALVE                TRICUSPID VALVE MV Area (PHT): 3.17 cm     TR Peak grad:   38.7 mmHg MV Decel Time: 239 msec     TR Vmax:        311.00 cm/s MR Peak grad: 49.3 mmHg MR Mean grad: 35.0 mmHg     SHUNTS MR Vmax:      351.00 cm/s   Systemic VTI:  0.13 m MR Vmean:     283.0 cm/s    Systemic Diam: 1.90 cm MV E velocity: 120.00 cm/s MV A velocity: 25.30 cm/s MV E/A ratio:  4.74 Candee Furbish MD Electronically signed by Candee Furbish MD Signature Date/Time: 02/07/2023/12:12:04 PM    Final    DG Chest 2 View  Result Date: 02/06/2023 CLINICAL DATA:  Chest pain and dyspnea as well as upper abdominal pain. EXAM: CHEST - 2 VIEW COMPARISON:  10/11/2022 FINDINGS: Lungs are adequately inflated with hazy bibasilar opacification with small amount of bilateral pleural fluid. Mild prominence of the central pulmonary vessels. Findings may be due to mild CHF/interstitial edema, although infection in the lung bases is possible. Mild stable cardiomegaly. Remainder of the exam is unchanged. IMPRESSION: Hazy bibasilar opacification with small amount of bilateral pleural fluid. Findings may be due to mild CHF/interstitial edema, although infection in the lung bases is possible. Electronically Signed   By: Marin Olp M.D.   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 with a pertinent PMH of systolic heart failure with EF recovery, T2DM, HTN, and advanced COPD on 4 LNC who presented with shortness of breath and admitted for heart failure exacerbation on hospital day 1.  Plan: Acute on chronic heart failure exacerbation with recovered EF (EF 55-60%) Initial evaluation showed BNP of 1000 with CXR showing hazy bibasiliar opacification. She is saturating well on 6L O2 nasal cannula. December weight was 97 kg following admission for HFpEF. Weight at 100 kg in ED, which is about 3 kg above dry weight. Her last cardiology note 1/24 mentions dose changing to lasix 80 mg in morning and 40 mg at night due to orthostatic intolerance. Upon asking patient what Lasix dose she takes at home, she clarified that she takes 80mg  in the morning and at night due to the former dosage not being enough to make her urinate. Last echo was 11/23 and showed EF 55-60%. PT came to see patient, recommends home health PT at discharge. -IV  lasix 80 mg BID -repeat Echo -PT recommends Home Health PT -GDMT with entresto, spironolactone, farxiga restarted -she does not take beta blocker in setting of COPD -trend BMP -electrolytes with K>4 and Mg>2  Sinus bradycardia History of sinus node dysfunction per cardiology. Sinus brady around 50 on monitor, dipped to 40s overnight. Hemodynamically stable. Don't suspect this is the underlying cause of her symptoms.   AKI Creatinine above baseline of 0.7 at 1.21. Likely cardiorenal in setting of fluid overload.  -stricts I and Os  UTI She also noted increased urge without no dysuria, with recent history of chills. UA shows some leukocytes, will treat for UTI with Macrobid. -Macrobid 100mg  BID   Chest pain She has been having central chest pain that is sharp with rest and exertion but denies it today. Troponin 26 to 23 with no  ischemic changes on EKG. CT coronary 8/23 showed moderate diffuse and small vessel disease. Don't suspect ACS.   Acute on chronic respiratory failure 2/2 to COPD on 6 L [place] Home inhaler is dulera. Baseline at home 4L [place]. Increased oxygen requirement today, stable on 6 L. -continue inhaler -O2 goal of >88%   T2DM A1c at 8.7 5 days ago. Insulin recently titrated down with hypoglycemia episodes at night. Home regimen includes lantus 20 units and novolog 14 units breakfast and lunch, 20 units before dinner, trulicity 1.5 mg weekly, and farxiga 10 mg daily. Patient reports nausea, we will add Zofran Q8 PRN.  -continue glargine 20 units -aspart 5 u tid with meals -SSI with decreased appetite -follow CGBs -Zofran prn for nausea   HTN -continue amlodipine, entresto 200 mg BID, and spironolactone    HLD Lipid panel in 1/24 with LDL 87. If lipid panel remains elevated in 3 months plan for PSK9 inhibitor. -atorvastatin 80 mg -zetia 10 mg   Best Practice: Diet: Carb modified IVF: Fluids: None VTE: rivaroxaban (XARELTO) tablet 10 mg Start: 02/06/23 1645 Code: Full Therapy Recs: Home Health PT, DME: none Family Contact: daughter, to be notified. DISPO: Anticipated discharge  2 days  to Home pending  medical stability and fluid diuresis .  Signature: Marisa Cyphers, Medical Student   Please contact the on call pager after 5 pm and on weekends at [phone].  Attestation for Student Documentation:  I personally was present and performed or re-performed the history, physical exam and medical decision-making activities of this service and have verified that the service and findings are accurately documented in the student's note.  Nani Gasser MD 02/07/2023, 12:38 PM

## 2023-02-07 NOTE — Progress Notes (Signed)
Patient has only had one episode of moderate urinary incontinence, and 439ml in the cannister via the [place]. MD notified, bladder scan ordered. Bladder scan volume is 118ml. Patient denies any pain or urge to urinate, however, patient requested to use the bedside commode and try to urinate. Patient urinated 42ml amber urine in the commode. Staff will continue to monitor urine output. Marcille Blanco, RN

## 2023-02-08 ENCOUNTER — Inpatient Hospital Stay (HOSPITAL_COMMUNITY): Payer: 59

## 2023-02-08 ENCOUNTER — Other Ambulatory Visit (HOSPITAL_COMMUNITY): Payer: Self-pay

## 2023-02-08 DIAGNOSIS — J9620 Acute and chronic respiratory failure, unspecified whether with hypoxia or hypercapnia: Secondary | ICD-10-CM

## 2023-02-08 DIAGNOSIS — R001 Bradycardia, unspecified: Secondary | ICD-10-CM

## 2023-02-08 DIAGNOSIS — Z87891 Personal history of nicotine dependence: Secondary | ICD-10-CM | POA: Diagnosis not present

## 2023-02-08 DIAGNOSIS — I5023 Acute on chronic systolic (congestive) heart failure: Secondary | ICD-10-CM | POA: Diagnosis not present

## 2023-02-08 LAB — BASIC METABOLIC PANEL
Anion gap: 10 (ref 5–15)
BUN: 36 mg/dL — ABNORMAL HIGH (ref 8–23)
CO2: 28 mmol/L (ref 22–32)
Calcium: 8.3 mg/dL — ABNORMAL LOW (ref 8.9–10.3)
Chloride: 94 mmol/L — ABNORMAL LOW (ref 98–111)
Creatinine, Ser: 1.62 mg/dL — ABNORMAL HIGH (ref 0.44–1.00)
GFR, Estimated: 35 mL/min — ABNORMAL LOW (ref >60)
Glucose, Bld: 218 mg/dL — ABNORMAL HIGH (ref 70–99)
Potassium: 4 mmol/L (ref 3.5–5.1)
Sodium: 132 mmol/L — ABNORMAL LOW (ref 135–145)

## 2023-02-08 LAB — GLUCOSE, CAPILLARY
Glucose-Capillary: 235 mg/dL — ABNORMAL HIGH (ref 70–99)
Glucose-Capillary: 211 mg/dL — ABNORMAL HIGH (ref 70–99)
Glucose-Capillary: 226 mg/dL — ABNORMAL HIGH (ref 70–99)
Glucose-Capillary: 180 mg/dL — ABNORMAL HIGH (ref 70–99)

## 2023-02-08 LAB — MAGNESIUM: Magnesium: 2.3 mg/dL (ref 1.7–2.4)

## 2023-02-08 MED ORDER — CEFADROXIL 500 MG PO CAPS
500.0000 mg | ORAL_CAPSULE | Freq: Two times a day (BID) | ORAL | Status: DC
  Administered 2023-02-08 – 2023-02-11 (×6): 500 mg via ORAL
  Filled 2023-02-08 (×7): qty 1

## 2023-02-08 MED ORDER — FUROSEMIDE 10 MG/ML IJ SOLN
120.0000 mg | Freq: Two times a day (BID) | INTRAVENOUS | Status: DC
  Administered 2023-02-08 – 2023-02-11 (×7): 120 mg via INTRAVENOUS
  Filled 2023-02-08: qty 2
  Filled 2023-02-08 (×3): qty 10
  Filled 2023-02-08: qty 12
  Filled 2023-02-08 (×2): qty 10
  Filled 2023-02-08: qty 12

## 2023-02-08 MED ORDER — CEFADROXIL 500 MG PO CAPS
500.0000 mg | ORAL_CAPSULE | Freq: Two times a day (BID) | ORAL | Status: DC
  Administered 2023-02-08: 500 mg via ORAL
  Filled 2023-02-08 (×2): qty 1

## 2023-02-08 MED ORDER — UMECLIDINIUM BROMIDE 62.5 MCG/ACT IN AEPB
1.0000 | INHALATION_SPRAY | Freq: Every day | RESPIRATORY_TRACT | Status: DC
  Administered 2023-02-08 – 2023-02-11 (×4): 1 via RESPIRATORY_TRACT
  Filled 2023-02-08: qty 7

## 2023-02-08 MED ORDER — SENNA 8.6 MG PO TABS
2.0000 | ORAL_TABLET | Freq: Every day | ORAL | Status: DC
  Administered 2023-02-08 – 2023-02-11 (×4): 17.2 mg via ORAL
  Filled 2023-02-08 (×4): qty 2

## 2023-02-08 MED ORDER — INSULIN GLARGINE-YFGN 100 UNIT/ML ~~LOC~~ SOLN
25.0000 [IU] | Freq: Every day | SUBCUTANEOUS | Status: DC
  Administered 2023-02-08 – 2023-02-10 (×3): 25 [IU] via SUBCUTANEOUS
  Filled 2023-02-08 (×4): qty 0.25

## 2023-02-08 MED ORDER — POLYETHYLENE GLYCOL 3350 17 G PO PACK
17.0000 g | PACK | Freq: Every day | ORAL | Status: AC
  Administered 2023-02-08 – 2023-02-09 (×2): 17 g via ORAL
  Filled 2023-02-08 (×3): qty 1

## 2023-02-08 MED ORDER — ACETAZOLAMIDE ER 500 MG PO CP12
500.0000 mg | ORAL_CAPSULE | Freq: Every day | ORAL | Status: DC
  Administered 2023-02-08 – 2023-02-11 (×4): 500 mg via ORAL
  Filled 2023-02-08 (×4): qty 1

## 2023-02-08 NOTE — Progress Notes (Signed)
   Heart Failure Stewardship Pharmacist Progress Note   PCP: Axel Filler, MD PCP-Cardiologist: Candee Furbish, MD    HPI:  67 yo F with PMH of COPD, oxygen dependent, CHF (EF 20-25% in 2015, recovered to 55-60%), CAD, HLD, GERD, T2DM, and CVA.   Admitted in Nov 2023 with acute on chronic CHF exacerbation. Diuresed w/ IV Lasix and treated w/ abx, steroids and nebs. Echo showed normal EF 55-60% and normal RV. After diureses, was transitioned to PO Lasix. Also placed on Entresto.   She was seen in HF TOC 10/2022. Jardiacne increased to 10 mg daily and spironlactone added. She was seen back in [facility] 11/2022 and spironolactone was increased to 25 mg daily. She was later seen at [facility] on 1/30 and lasix was decreased to 80 mg AM and 40 mg PM.  Presented back to the ED on 3/16 with increased shortness of breath, orthopnea, LE edema, chills, and nausea/vomiting. Reports having a recent GI bug and was drinking more fluids. CXR with signs of mild CHF vs PNA. ECHO 3/17 showed LVEF 60-65%, no regional wall motion abnormalities, G2DD, and RV normal. Now being treated for UTI.  Current HF Medications: Diuretic: furosemide 120 mg IV BID + diamox 500 mg daily ACE/ARB/ARNI: Entresto 97/103 mg BID MRA: spironolactone 25 mg daily  Prior to admission HF Medications: Diuretic: furosemide 80 mg BID ACE/ARB/ARNI: Entresto 97/103 mg BID MRA: spironolactone 25 mg daily SGLT2i: Farxiga 10 mg daily  Pertinent Lab Values: Serum creatinine 1.62, BUN 36, Potassium 4.0, Sodium 132, BNP 1437.4, Magnesium 2.3, A1c 8.7   Vital Signs: Weight: 223 lbs (admission weight: 220 lbs) Blood pressure: 130-150/60s  Heart rate: 50s  I/O: -0.6L yesterday; net -0.6L  Medication Assistance / Insurance Benefits Check: Does the patient have prescription insurance?  Yes Type of insurance plan: [place] Medicaid  Outpatient Pharmacy:  Prior to admission outpatient pharmacy[facility] Is the patient willing to use [facility] at  discharge? Yes Is the patient willing to transition their outpatient pharmacy to utilize a [facility] outpatient pharmacy?   Pending    Assessment: 1. 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 NYHA class III symptoms. - Agree with increasing lasix to 120 mg IV BID and adding diamox 500 mg daily. Strict I/Os and daily weights. Keep K>4 and Mg>2. - No BB with bradycardia and COPD - Continue Entresto 97/103 mg BID - Continue spironolactone 25 mg daily - Holding [place] while being treated for UTI, consider resuming once therapy complete   Plan: 1) Medication changes recommended at this time: - Agree with changes  2) Patient assistance: - None pending, has [place] Medicaid   3)  Education  - To be completed prior to discharge  Kerby Nora, PharmD, BCPS Heart Failure Cytogeneticist Phone [phone]

## 2023-02-08 NOTE — TOC Benefit Eligibility Note (Signed)
Patient Teacher, English as a foreign language completed.    The patient is currently admitted and upon discharge could be taking Incruse Ellipta 62.5 mcg/act.  The current 30 day co-pay is $0.00.   The patient is insured through [place], [place] Patient Advocate Specialist [place] Patient Advocate Team Direct Number: [phone]  Fax: [phone]

## 2023-02-08 NOTE — Progress Notes (Signed)
Heart Failure Nurse Navigator Progress Note  PCP: Axel Filler, MD PCP-Cardiologist: Marlou Porch Admission Diagnosis: Hypoxia, Acute on chronic congestive heart failure.  Admitted from: Home  Presentation:   Alicia [name] presented with shortness of breath, chills, N/V for a few weeks. Normally wears 4 L of oxygen at home. BP 135/70, HR 55, BNP 1,437, BLE +1, IV lasix and zofran given.   Patient has been seen in HF TOC in 12/23 and 11/2022. Patient and her daughter were educated on the sign and symptoms of heart failure, daily weights, when to call her doctor or go to the ED, Diet/ fluids, patient reported that she drinks between 4-5 bottles/ cans of pepsi and Mt. Dew per day. Education on taking all medication as prescribed and attending all medical appointments. Patient verbalized her understanding and is scheduled for a follow up on 02/24/2023 @ 11 am.   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  Non-functioning   Aortic atherosclerosis ([place]) 09/24/2017   Asymptomatic, found on CT scan   Blood transfusion without reported diagnosis    Chronic Systolic Heart Failure 123456   Felt to be non-ischemic and secondary to hypertension.  Echo (05/28/2014): LVEF 25%.  Is not interested in AICD placement.   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  No residual deficits.   History of cerebrovascular accident 05/10/2013   Per patient report 6 previous strokes, most recent on 05/10/13.  No residual deficits.   Hyperlipidemia    Overweight (BMI 25.0-29.9) 09/24/2017   Psoriasis 09/24/2017   Seasonal allergic rhinitis due to pollen 09/24/2017   Spring and early Fall   Small Bowel Obstruction (SBO) 01/13/2014   Ex-Lap & Lysis of Adhesion, 01/15/2014   Thyroid nodule 09/04/2019   Tobacco use disorder 01/15/2014   Type 2 diabetes mellitus with moderate nonproliferative diabetic retinopathy ([place]) 10/15/2013     Social History   Socioeconomic History   Marital status: Widowed    Spouse name: Not on file   Number of children: 3   Years of education: Not on file   Highest education level: High school graduate  Occupational History   Occupation: Disabled    Comment: Formerly worked in [employer] [place] Use   Smoking status: Former    Packs/day: 1.00    Years: 40.00    Additional pack years: 0.00    Total pack years: 40.00    Types: Cigarettes    Quit date: 09/17/2019    Years since quitting: 3.3   Smokeless tobacco: Never  Vaping Use   Vaping Use: Never used  Substance and Sexual Activity   Alcohol use: No    Alcohol/week: 0.0 standard drinks of alcohol   Drug use: No   Sexual activity: Not Currently    Birth control/protection:  Post-menopausal  Other Topics Concern   Not on file  Social History Narrative   Current Social History 02/25/2021        Patient lives with family in a home which is 2 stories. There are steps up to the entrance the patient uses with handrails.       Patient's method of transportation is personal car.      The highest level of education was some high school.      The patient currently disabled.      Identified important Relationships are "Family, my daughter and grandkids"       Pets : My dog        Interests / Fun: "sleeping, working in my flowerbed        Current Stressors: The war in [place]       Social Determinants of Health   Financial Resource Strain: Low Risk  (10/14/2022)   Overall Financial Resource Strain (CARDIA)    Difficulty of Paying Living Expenses: Not hard at all  Food Insecurity: No Food Insecurity (02/07/2023)   Hunger Vital Sign    Worried About Running Out of Food in the Last Year: Never true    [place] in the Last Year: Never true  Transportation Needs: No Transportation Needs (02/07/2023)   PRAPARE - Hydrologist (Medical): No    Lack of Transportation (Non-Medical): No  Physical Activity: Insufficiently Active (09/28/2022)   Exercise Vital Sign    Days of Exercise per Week: 1 day    Minutes of Exercise per Session: 10 min  Stress: No Stress Concern Present (09/28/2022)   [place]    Feeling of Stress : Not at all  Social Connections: Socially Isolated (09/28/2022)   Social Connection and Isolation Panel [NHANES]    Frequency of Communication with Friends and Family: More than three times a week    Frequency of Social Gatherings with Friends and Family: Never    Attends Religious Services: Never    Marine scientist or Organizations: No    Attends Archivist Meetings: Never    Marital Status: Widowed   Education Assessment and Provision:  Detailed education and instructions provided on heart failure disease management including the following:  Signs and symptoms of Heart Failure When to call the physician Importance of daily weights Low sodium diet Fluid restriction Medication management Anticipated future follow-up appointments  Patient education given on each of the above topics.  Patient acknowledges understanding via teach back method and acceptance of all instructions.  Education Materials:  "Living Better With Heart  Failure" Booklet, HF zone tool, & Daily Weight Tracker Tool.  Patient has scale at home: yes Patient has pill box at home: yes    High Risk Criteria for Readmission and/or Poor Patient Outcomes: Heart failure hospital admissions (last 6 months): 2  No Show rate: 5% Difficult social situation: No Demonstrates medication adherence: Yes Primary Language: English Literacy level: Reading, writing, and comprehension.   Barriers of Care:   Diet/ fluids/ daily weights (Pepsi/ Mt. Dew 4-5 per day) Continued HR education  Considerations/Referrals:   Referral made to Heart Failure Pharmacist Stewardship: Yes Referral made to Heart Failure CSW/NCM TOC: No Referral made to Heart & Vascular TOC clinic: Yes, 02/24/2023 @ 11 am  Items for Follow-up on DC/TOC: Diet/ fluids/ daily weights ( drinks 4-5 pepsi and Mt. Dew per day)  Continued HF education.  Earnestine Leys, BSN, Clinical cytogeneticist Only

## 2023-02-08 NOTE — Consult Note (Signed)
   [facility] Inpatient Consult   02/08/2023  Yanin Tepedino Odenthal 1956-05-04 [id number]  [place] Organization [ACO] Patient: [employer] Medicare  Primary Care Provider:  Axel Filler, MD, [facility] Medicine is listed to provide the transition of care follow up.  Patient screened for hospitalization with noted medium risk score for unplanned readmission risk and to assess for potential [place] Management service needs for post hospital transition for care coordination.  Review of patient's electronic medical record reveals patient is from home with daughter, uses home 13 through [employer].  She states she would like [employer] to check her equipment and for tubing supply changes for her oxygen. Patient denies any SDOH needs. Shared patient's concern regarding oxygen tubing and wanted [employer] to check equipment and needs. Patient denies any issues with medications as well. Noted A1C of 8.7 %. Explained [place] Coordination for follow up.  Patient states she doesn't mind the follow up as long as she knows who's calling  Plan:  Continue to follow progress and disposition to assess for post hospital community care coordination/management needs.  Referral request for community care coordination: will follow for diabetes needs  Of note, [place] does not replace or interfere with any arrangements made by the Inpatient Transition of Care team.  For questions contact:   Natividad Brood, RN BSN [place]  [phone] business mobile phone Toll free office [phone]  *[place]  [phone] Fax number: [phone] [email or web address]

## 2023-02-08 NOTE — TOC Progression Note (Addendum)
Transition of Care [facility]) - Progression Note    Patient Details  Name: Alicia [name] MRN: [id number] Date of Birth: Mar 31, 1956  Transition of Care [facility]) CM/SW Contact  Zenon Mayo, RN Phone Number: 02/08/2023, 2:41 PM  Clinical Narrative:    NCM spoke with patient at the bedside, she has home oxygen with [facility] 4 liters, she states she has been having a problem getting longer [place] tubing from them.  NCM contacted Renee at [facility] she will have someone to deliver a 7 foot tubing to patient 's room tomorrow.  Also Patient states her daughter will transport her home at discharge. NCM offered choice for HHPT, HHOT, she states she does not have a preference.  NCM made referral to [facility] with [facility] for [place], [place].  Awaiting to hear back. TOC following.  Per Suanne Marker, she will send it in for soc on [place] or Saturday, and if she has any issues she will call this NCM back.        Expected Discharge Plan and Services                                               Social Determinants of Health (SDOH) Interventions SDOH Screenings   Food Insecurity: No Food Insecurity (02/07/2023)  Housing: Low Risk  (02/06/2023)  Transportation Needs: No Transportation Needs (02/07/2023)  Utilities: Not At Risk (02/07/2023)  Alcohol Screen: Low Risk  (02/08/2023)  Depression (PHQ2-9): Low Risk  (02/01/2023)  Financial Resource Strain: Low Risk  (02/08/2023)  Physical Activity: Insufficiently Active (09/28/2022)  Social Connections: Socially Isolated (09/28/2022)  Stress: No Stress Concern Present (09/28/2022)  Tobacco Use: Medium Risk (02/06/2023)    Readmission Risk Interventions     No data to display

## 2023-02-08 NOTE — Progress Notes (Addendum)
HD#2 SUBJECTIVE:  Patient Summary: Alicia [name] is a 67 y.o. with a pertinent PMH of systolic heart failure with EF recovery, T2DM, HTN, and advanced COPD on 4 LNC who presented with shortness of breath and admitted for heart failure exacerbation on hospital day 2.  Overnight Events: NAEON. Patient reports that she slept well and denies any chest pain. She said her breathing today is "average" and currently is on 7LO2 [place]. She reports that the nausea medication she had yesterday did work for her. She states this morning she has abdominal discomfort since she has not had a solid bowel movement in a week or so.   OBJECTIVE:  Vital Signs: Vitals:   02/07/23 2124 02/07/23 2246 02/08/23 0555 02/08/23 0733  BP:  (!) 132/57 (!) 129/55 (!) 154/60  Pulse:  (!) 55 (!) 52 (!) 52  Resp:  20 20 16   Temp:  97.8 F (36.6 C) 98 F (36.7 C) 97.8 F (36.6 C)  TempSrc:  Oral Oral Oral  SpO2: 94% 94% 93% 90%  Weight:   101.3 kg   Height:       Supplemental O2: Nasal Cannula SpO2: 90 % O2 Flow Rate (L/min): 7 L/min  Filed Weights   02/06/23 1816 02/07/23 0508 02/08/23 0555  Weight: 100.2 kg 101.8 kg 101.3 kg     Intake/Output Summary (Last 24 hours) at 02/08/2023 0818 Last data filed at 02/08/2023 0601 Gross per 24 hour  Intake 240 ml  Output 825 ml  Net -585 ml    Net IO Since Admission: -865 mL [02/08/23 0818]  Physical Exam: Physical Exam Constitutional:      General: She is not in acute distress.    Appearance: She is obese.  HENT:     Head: Normocephalic and atraumatic.  Neck:     Vascular: JVD present.  Cardiovascular:     Rate and Rhythm: Normal rate and regular rhythm.     Heart sounds: No murmur heard. Pulmonary:     Effort: Pulmonary effort is normal. No respiratory distress.     Breath sounds: Examination of the right-lower field reveals rales. Examination of the left-lower field reveals rales. Rales present. No wheezing.     Comments: Satting well on 7L O2  [place] Chest:     Chest wall: No tenderness.  Abdominal:     General: Bowel sounds are normal.     Palpations: Abdomen is soft.     Tenderness: There is no abdominal tenderness. There is no guarding.  Musculoskeletal:     Right lower leg: No edema.     Left lower leg: No edema.  Skin:    General: Skin is warm and dry.  Neurological:     General: No focal deficit present.     Mental Status: She is alert.  Psychiatric:        Mood and Affect: Mood normal.        Behavior: Behavior normal.     Patient Lines/Drains/Airways Status     Active Line/Drains/Airways     Name Placement date Placement time Site Days   Peripheral IV 02/06/23 20 G Right Antecubital 02/06/23  1335  Antecubital  1   External Urinary Catheter 02/06/23  1824  --  1   Incision - 4 Ports Abdomen 1: Umbilicus 2: Mid;Upper 3: Right;Upper 4: Right;Lateral 08/02/17  1150  -- 2015            Pertinent Labs:    Latest Ref Rng & Units 02/06/2023  1:47 PM 02/06/2023    1:31 PM 10/13/2022    1:55 AM  CBC  WBC 4.0 - 10.5 K/uL  9.0  11.6   Hemoglobin 12.0 - 15.0 g/dL 10.5  9.6  10.5   Hematocrit 36.0 - 46.0 % 31.0  30.3  33.3   Platelets 150 - 400 K/uL  246  273        Latest Ref Rng & Units 02/08/2023   12:41 AM 02/07/2023   12:55 AM 02/06/2023    1:47 PM  CMP  Glucose 70 - 99 mg/dL 218  293  245   BUN 8 - 23 mg/dL 36  24  23   Creatinine 0.44 - 1.00 mg/dL 1.62  1.21  1.30   Sodium 135 - 145 mmol/L 132  135  137   Potassium 3.5 - 5.1 mmol/L 4.0  4.5  3.4   Chloride 98 - 111 mmol/L 94  96  97   CO2 22 - 32 mmol/L 28  29    Calcium 8.9 - 10.3 mg/dL 8.3  8.5      Recent Labs    02/07/23 1610 02/07/23 2034 02/08/23 0558  GLUCAP 212* 175* 235*      Pertinent Imaging: ECHOCARDIOGRAM COMPLETE  Result Date: 02/07/2023    ECHOCARDIOGRAM REPORT   Patient Name:   Alicia [name] Date of Exam: 02/07/2023 Medical Rec #:  [id number]       Height:       67.0 in Accession #:    [id number]      Weight:        224.4 lb Date of Birth:  1955-12-18        BSA:          2.124 m Patient Age:    21 years        BP:           139/60 mmHg Patient Gender: F               HR:           53 bpm. Exam Location:  Inpatient Procedure: 2D Echo, 3D Echo, Cardiac Doppler, Color Doppler and Strain Analysis Indications:    CHF  History:        Patient has prior history of Echocardiogram examinations, most                 recent 10/12/2022. CHF, CAD, COPD; Risk Factors:Diabetes,                 Hypertension, Former Smoker and Dyslipidemia.  Sonographer:    Leavy Cella RDCS Referring Phys: [id number] [place]  1. Left ventricular ejection fraction, by estimation, is 60 to 65%. The left ventricle has normal function. The left ventricle has no regional wall motion abnormalities. There is moderate left ventricular hypertrophy. Left ventricular diastolic parameters are consistent with Grade II diastolic dysfunction (pseudonormalization).  2. Right ventricular systolic function is normal. The right ventricular size is normal.  3. Left atrial size was mildly dilated.  4. The mitral valve is normal in structure. Mild mitral valve regurgitation. No evidence of mitral stenosis.  5. The aortic valve is normal in structure. Aortic valve regurgitation is not visualized. No aortic stenosis is present.  6. The inferior vena cava is normal in size with greater than 50% respiratory variability, suggesting right atrial pressure of 3 mmHg. Comparison(s): No significant change from prior study. Prior images reviewed side by side. FINDINGS  Left Ventricle: Left ventricular  ejection fraction, by estimation, is 60 to 65%. The left ventricle has normal function. The left ventricle has no regional wall motion abnormalities. The left ventricular internal cavity size was normal in size. There is  moderate left ventricular hypertrophy. Left ventricular diastolic parameters are consistent with Grade II diastolic dysfunction (pseudonormalization). Right  Ventricle: The right ventricular size is normal. No increase in right ventricular wall thickness. Right ventricular systolic function is normal. Left Atrium: Left atrial size was mildly dilated. Right Atrium: Right atrial size was normal in size. Pericardium: There is no evidence of pericardial effusion. Mitral Valve: The mitral valve is normal in structure. Mild mitral valve regurgitation. No evidence of mitral valve stenosis. Tricuspid Valve: The tricuspid valve is normal in structure. Tricuspid valve regurgitation is mild . No evidence of tricuspid stenosis. Aortic Valve: The aortic valve is normal in structure. Aortic valve regurgitation is not visualized. No aortic stenosis is present. Pulmonic Valve: The pulmonic valve was normal in structure. Pulmonic valve regurgitation is not visualized. No evidence of pulmonic stenosis. Aorta: The aortic root is normal in size and structure. Venous: The inferior vena cava is normal in size with greater than 50% respiratory variability, suggesting right atrial pressure of 3 mmHg. IAS/Shunts: No atrial level shunt detected by color flow Doppler.  LEFT VENTRICLE PLAX 2D LVIDd:         5.80 cm   Diastology LVIDs:         3.20 cm   LV e' medial:    5.98 cm/s LV PW:         1.40 cm   LV E/e' medial:  20.1 LV IVS:        1.40 cm   LV e' lateral:   6.74 cm/s LVOT diam:     1.90 cm   LV E/e' lateral: 17.8 LV SV:         38 LV SV Index:   18 LVOT Area:     2.84 cm                           3D Volume EF:                          3D EF:        55 %                          LV EDV:       168 ml                          LV ESV:       76 ml                          LV SV:        92 ml RIGHT VENTRICLE RV Basal diam:  3.30 cm RV Mid diam:    2.90 cm RV S prime:     12.80 cm/s TAPSE (M-mode): 1.8 cm LEFT ATRIUM             Index        RIGHT ATRIUM           Index LA diam:        4.00 cm 1.88 cm/m   RA Area:     12.90 cm LA Vol (A2C):  84.6 ml 39.84 ml/m  RA Volume:   28.00 ml  13.18  ml/m LA Vol (A4C):   57.5 ml 27.08 ml/m LA Biplane Vol: 69.9 ml 32.92 ml/m  AORTIC VALVE LVOT Vmax:   69.70 cm/s LVOT Vmean:  50.900 cm/s LVOT VTI:    0.134 m  AORTA Ao Root diam: 3.20 cm Ao Asc diam:  3.20 cm MITRAL VALVE                TRICUSPID VALVE MV Area (PHT): 3.17 cm     TR Peak grad:   38.7 mmHg MV Decel Time: 239 msec     TR Vmax:        311.00 cm/s MR Peak grad: 49.3 mmHg MR Mean grad: 35.0 mmHg     SHUNTS MR Vmax:      351.00 cm/s   Systemic VTI:  0.13 m MR Vmean:     283.0 cm/s    Systemic Diam: 1.90 cm MV E velocity: 120.00 cm/s MV A velocity: 25.30 cm/s MV E/A ratio:  4.74 Alicia Furbish MD Electronically signed by Alicia Furbish MD Signature Date/Time: 02/07/2023/12:12:04 PM    Final     ASSESSMENT/PLAN:  Assessment: Principal Problem:   Acute exacerbation of CHF (congestive heart failure) (HCC) Active Problems:   Type 2 diabetes mellitus with moderate nonproliferative diabetic retinopathy (HCC)   Essential hypertension   UTI (urinary tract infection)   Sinus bradycardia   Acute on chronic hypoxic respiratory failure ([place])  Alicia [name] is a 67 y.o. with a pertinent PMH of systolic heart failure with EF recovery, T2DM, HTN, and advanced COPD on 4 LNC who presented with shortness of breath and admitted for heart failure exacerbation on hospital day 2.  Plan: Acute on chronic heart failure exacerbation with recovered EF (EF 60-65%) She is saturating well on 7L O2 nasal cannula. December weight was 97 kg following admission for HFpEF. Weight at 101.3 kg today, which is about 4 kg above dry weight. Patient's hyponatremia, increased O2 requirement overnight, and physical exam findings of JVD/crackles in breath sounds suggest overall net fluid gain and could indicate inadequate diuresis at her current Lasix dose. We will increase Lasix to 120mg  BID and add acetazolamide 500mg  daily. Updated echo showed EF 60-65% with no significant changes from prior echo so that is reassuring. PT/OT came  to see patient, recommends home health PT/OT at discharge. -Start IV lasix 120 mg BID -Start acetazolamide 500mg  daily -PT/OT recommends Home Health PT/OT -GDMT with entresto 97-103mg  and spironolactone 25mg  daily, will hold Farxiga in setting of UTI -she does not take beta blocker in setting of COPD -trend BMP -continuous cardiac monitoring  -replace electrolytes with K>4 and Mg>2 -daily weights  Sinus bradycardia History of sinus node dysfunction per cardiology. Sinus brady around 52 on monitor, stayed in 50s overnight. Hemodynamically stable. Don't suspect this is the underlying cause of her symptoms.   AKI Creatinine above baseline of 0.7 at 1.62. Likely cardiorenal in setting of fluid overload.  -stricts I and Os -diurese as above -trend BMP  UTI She also noted increased urge without no dysuria, with recent history of chills. UA shows some leukocytes, started treatment for UTI with Macrobid, but will switch to cefadroxil today per pharmacy recommendation as Macrobid less effective in setting of renal insufficiency. Will also hold Farxiga in the setting of this UTI until it resolves.  -Start Cefadroxil 500mg  BID for 4 days (last dose on 3/21 evening) -Hold Farxiga  -monitor kidney function  Chest pain She has been having central chest pain that is sharp with rest and exertion but denies it today. Troponin 26 to 23 with no ischemic changes on EKG. CT coronary 8/23 showed moderate diffuse and small vessel disease. Don't suspect ACS.   Acute on chronic respiratory failure 2/2 to COPD on 7 L [place] inhaler is Dulera. Baseline at home 4L [place]. Increased oxygen requirement today, stable on 7 L. Will also add Incruse Ellipta to see if this improves her breathing. Patient will have Spiriva at discharge as it is covered by her insurance.  -continue Dulera -start Incruse Ellipta -O2 goal of >88% -will discharge with Spiriva per pharmacy    T2DM with hyperglycemia A1c at 8.7 5 days ago.  Insulin recently titrated down during clinic visit with hypoglycemia episodes at night. Home regimen includes lantus 20 units and novolog 14 units breakfast and lunch, 20 units before dinner, trulicity 1.5 mg weekly, and farxiga 10 mg daily. Given patient's hyperglycemia today, we will increase her Lantus to 25 units. -Increase glargine from 20 to 25 units -aspart 5 u tid with meals -SSI with decreased appetite -follow CGBs -Zofran prn for nausea   HTN -continue amlodipine 10mg  daily, entresto 200 mg BID, and spironolactone 25mg  daily   HLD Lipid panel in 1/24 with LDL 87. If lipid panel remains elevated in 3 months plan for PSK9 inhibitor. -atorvastatin 80 mg daily -zetia 10 mg daily -aspirin 81mg  daily -xarelto 10mg  daily  Constipation Patient reports no solid BM in a week and her abdomen feels uncomfortable. We will start senekot 2 tablets daily and Miralax 17g. -Start senekot 2 tabs daily -Start Miralax 17g daily    Best Practice: Diet: Carb modified IVF: Fluids: None VTE: rivaroxaban (XARELTO) tablet 10 mg Start: 02/06/23 1645 Code: Full Therapy Recs: Home Health PT/OT, DME: none Family Contact: daughter, to be notified. DISPO: Anticipated discharge  2 days  to Home pending  medical stability and fluid diuresis .  Signature: Marisa Cyphers, Medical Student   Please contact the on call pager after 5 pm and on weekends at [phone].  Attestation for Student Documentation:  I personally was present and performed or re-performed the history, physical exam and medical decision-making activities of this service and have verified that the service and findings are accurately documented in the student's note.  Nani Gasser MD 02/08/2023, 8:18 AM

## 2023-02-09 ENCOUNTER — Encounter (HOSPITAL_COMMUNITY): Payer: Self-pay | Admitting: Internal Medicine

## 2023-02-09 DIAGNOSIS — J9620 Acute and chronic respiratory failure, unspecified whether with hypoxia or hypercapnia: Secondary | ICD-10-CM | POA: Diagnosis not present

## 2023-02-09 DIAGNOSIS — I5023 Acute on chronic systolic (congestive) heart failure: Secondary | ICD-10-CM | POA: Diagnosis not present

## 2023-02-09 DIAGNOSIS — R001 Bradycardia, unspecified: Secondary | ICD-10-CM | POA: Diagnosis not present

## 2023-02-09 DIAGNOSIS — Z87891 Personal history of nicotine dependence: Secondary | ICD-10-CM | POA: Diagnosis not present

## 2023-02-09 DIAGNOSIS — D649 Anemia, unspecified: Secondary | ICD-10-CM

## 2023-02-09 DIAGNOSIS — E611 Iron deficiency: Secondary | ICD-10-CM | POA: Diagnosis present

## 2023-02-09 HISTORY — DX: Anemia, unspecified: D64.9

## 2023-02-09 LAB — BASIC METABOLIC PANEL
Anion gap: 7 (ref 5–15)
BUN: 40 mg/dL — ABNORMAL HIGH (ref 8–23)
CO2: 28 mmol/L (ref 22–32)
Calcium: 8.6 mg/dL — ABNORMAL LOW (ref 8.9–10.3)
Chloride: 95 mmol/L — ABNORMAL LOW (ref 98–111)
Creatinine, Ser: 1.53 mg/dL — ABNORMAL HIGH (ref 0.44–1.00)
GFR, Estimated: 38 mL/min — ABNORMAL LOW (ref >60)
Glucose, Bld: 173 mg/dL — ABNORMAL HIGH (ref 70–99)
Potassium: 4.7 mmol/L (ref 3.5–5.1)
Sodium: 130 mmol/L — ABNORMAL LOW (ref 135–145)

## 2023-02-09 LAB — CBC
HCT: 30.4 % — ABNORMAL LOW (ref 36.0–46.0)
Hemoglobin: 9.4 g/dL — ABNORMAL LOW (ref 12.0–15.0)
MCH: 26.1 pg (ref 26.0–34.0)
MCHC: 30.9 g/dL (ref 30.0–36.0)
MCV: 84.4 fL (ref 80.0–100.0)
Platelets: 280 10*3/uL (ref 150–400)
RBC: 3.6 MIL/uL — ABNORMAL LOW (ref 3.87–5.11)
RDW: 16.5 % — ABNORMAL HIGH (ref 11.5–15.5)
WBC: 8.7 10*3/uL (ref 4.0–10.5)
nRBC: 0.2 % (ref 0.0–0.2)

## 2023-02-09 LAB — GLUCOSE, CAPILLARY
Glucose-Capillary: 160 mg/dL — ABNORMAL HIGH (ref 70–99)
Glucose-Capillary: 292 mg/dL — ABNORMAL HIGH (ref 70–99)
Glucose-Capillary: 225 mg/dL — ABNORMAL HIGH (ref 70–99)
Glucose-Capillary: 167 mg/dL — ABNORMAL HIGH (ref 70–99)

## 2023-02-09 MED ORDER — MAGNESIUM CITRATE PO SOLN
1.0000 | Freq: Once | ORAL | Status: AC
  Administered 2023-02-09: 1 via ORAL
  Filled 2023-02-09: qty 296

## 2023-02-09 MED ORDER — IPRATROPIUM-ALBUTEROL 0.5-2.5 (3) MG/3ML IN SOLN: 3.0000 mL | Freq: Four times a day (QID) | RESPIRATORY_TRACT | Status: DC | PRN

## 2023-02-09 MED ORDER — METOLAZONE 5 MG PO TABS
5.0000 mg | ORAL_TABLET | Freq: Once | ORAL | Status: AC
  Administered 2023-02-09: 5 mg via ORAL
  Filled 2023-02-09: qty 1

## 2023-02-09 NOTE — Care Management Important Message (Signed)
Important Message  Patient Details  Name: Alicia [name] MRN: [id number] Date of Birth: May 03, 1956   Medicare Important Message Given:  Yes     Shelda Altes 02/09/2023, 8:32 AM

## 2023-02-09 NOTE — Progress Notes (Signed)
HD#3 SUBJECTIVE:  Patient Summary: Alicia [name] is a 67 y.o. with a pertinent PMH of systolic heart failure with EF recovery, T2DM, HTN, and advanced COPD on 4 LNC who presented with shortness of breath and admitted for heart failure exacerbation on hospital day 3.  Overnight Events: NAEON. Patient reports that she slept well and denies any chest pain. She said her breathing today is "doing okay" and currently is on 5.5LO2 [place]. She reports nausea this morning and requests her zofran. Patient described her abdomen as "iron" as she has not had a solid bowel movement in a week or so. She has not yet had a BM since she started the senokot and Miralax yesterday.  OBJECTIVE:  Vital Signs: Vitals:   02/09/23 0711 02/09/23 0757 02/09/23 0758 02/09/23 1133  BP: (!) 150/56   121/64  Pulse: (!) 51   (!) 51  Resp: 18   18  Temp: 98.3 F (36.8 C)     TempSrc: Oral     SpO2: 93% 97% 93% 93%  Weight:      Height:       Supplemental O2: Nasal Cannula SpO2: 93 % O2 Flow Rate (L/min): 7 L/min  Filed Weights   02/07/23 0508 02/08/23 0555 02/09/23 0500  Weight: 101.8 kg 101.3 kg 102.3 kg     Intake/Output Summary (Last 24 hours) at 02/09/2023 1453 Last data filed at 02/09/2023 0514 Gross per 24 hour  Intake 242 ml  Output 900 ml  Net -658 ml   Net IO Since Admission: -1,271 mL [02/09/23 1453]  Physical Exam: Physical Exam Constitutional:      General: She is not in acute distress.    Appearance: She is obese.  HENT:     Head: Normocephalic and atraumatic.  Neck:     Vascular: JVD present.  Cardiovascular:     Rate and Rhythm: Normal rate and regular rhythm.     Heart sounds: No murmur heard. Pulmonary:     Effort: Pulmonary effort is normal. No respiratory distress.     Breath sounds: Examination of the right-lower field reveals rales. Examination of the left-lower field reveals rales. Rales present. No wheezing.     Comments: Satting well on 5.5L O2 [place] Chest:     Chest  wall: No tenderness.  Abdominal:     General: Bowel sounds are normal.     Palpations: Abdomen is soft.     Tenderness: There is no abdominal tenderness. There is no guarding.  Musculoskeletal:     Right lower leg: No edema.     Left lower leg: No edema.  Skin:    General: Skin is warm and dry.  Neurological:     General: No focal deficit present.     Mental Status: She is alert.  Psychiatric:        Mood and Affect: Mood normal.        Behavior: Behavior normal.        Pertinent Labs:    Latest Ref Rng & Units 02/09/2023   12:48 AM 02/06/2023    1:47 PM 02/06/2023    1:31 PM  CBC  WBC 4.0 - 10.5 K/uL 8.7   9.0   Hemoglobin 12.0 - 15.0 g/dL 9.4  10.5  9.6   Hematocrit 36.0 - 46.0 % 30.4  31.0  30.3   Platelets 150 - 400 K/uL 280   246        Latest Ref Rng & Units 02/09/2023   12:48  AM 02/08/2023   12:41 AM 02/07/2023   12:55 AM  CMP  Glucose 70 - 99 mg/dL 173  218  293   BUN 8 - 23 mg/dL 40  36  24   Creatinine 0.44 - 1.00 mg/dL 1.53  1.62  1.21   Sodium 135 - 145 mmol/L 130  132  135   Potassium 3.5 - 5.1 mmol/L 4.7  4.0  4.5   Chloride 98 - 111 mmol/L 95  94  96   CO2 22 - 32 mmol/L 28  28  29    Calcium 8.9 - 10.3 mg/dL 8.6  8.3  8.5     Recent Labs    02/08/23 2153 02/09/23 0625 02/09/23 1331  GLUCAP 180* 160* 292*     Pertinent Imaging: No results found.  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 with a pertinent PMH of systolic heart failure with EF recovery, T2DM, HTN, and advanced COPD on 4 LNC who presented with shortness of breath and admitted for heart failure exacerbation on hospital day 2.  Plan: Acute on  chronic heart failure exacerbation with recovered EF (EF 60-65%) She is saturating well on 5.5L O2 nasal cannula. December weight was 97 kg following admission for HFpEF. Weight at 102.3 kg today, which is about 5 kg above dry weight. Patient's continued hyponatremia, increased O2 requirement from baseline, and physical exam findings of JVD/crackles in breath sounds suggest overall net fluid retention and could indicate inadequate diuresis at her current medication regimen. We will continue Lasix at 120mg  BID, continue acetazolamide 500mg  daily, and add a one-time dose of metolazone 5mg . We will reassess her volume status tomorrow and if needed order another dose of metolazone.  -Metolazone 5mg  for one dose  -Continue IV lasix 120 mg BID -Continue acetazolamide 500mg  daily -GDMT with entresto 97-103mg  and spironolactone 25mg  daily, will hold Farxiga in setting of UTI -she does not take beta blocker in setting of COPD -trend BMP -continuous cardiac monitoring  -replace electrolytes with K>4 and Mg>2 -daily weights  Sinus bradycardia History of sinus node dysfunction per cardiology. Sinus brady around 58 on monitor, stayed in 50s overnight. Hemodynamically stable. Don't suspect this is the underlying cause of her symptoms.   AKI Creatinine above baseline of 0.7 at 1.53 today. Likely cardiorenal in setting of fluid overload.  -stricts I and Os -diurese as above -trend BMP  UTI She also noted increased urge without no dysuria, with recent history of chills. UA shows some leukocytes, started treatment for UTI with Macrobid, but switched to cefadroxil per pharmacy recommendation as Macrobid less effective in setting of renal insufficiency. Will also hold Farxiga in the setting of this UTI until it resolves.  -Continue Cefadroxil 500mg  BID for 4 days, currently on day 2 of 4 (last dose on 3/21 evening) -Hold Farxiga  -monitor kidney function   Chest pain She has been having central chest pain  that is sharp with rest and exertion but denies it today. Troponin 26 to 23 with no ischemic changes on EKG. CT coronary 8/23 showed moderate diffuse and small vessel disease. Don't suspect ACS.   Acute on chronic respiratory failure 2/2 to COPD on 5.5 L [facility]  inhaler is Dulera. Baseline at home 4L [place]. Increased oxygen requirement today, stable on 5.5 L. Will also add Incruse Ellipta to see if this improves her breathing. Also added Duonebs Q6 PRN if patient has worsening shortness of breath. Patient will have Incruse at discharge as it is covered by her insurance.  -Duoneb Q6 PRN -continue Dulera -start Incruse Ellipta -O2 goal of >88% -will discharge with Spiriva per pharmacy    T2DM with hyperglycemia A1c at 8.7 5 days ago. Insulin recently titrated down during clinic visit with hypoglycemia episodes at night. Home regimen includes lantus 20 units, novolog 14 units breakfast/lunch, novolog 20 units before dinner, trulicity 1.5 mg weekly, and farxiga 10 mg daily. Given patient's hyperglycemia yesterday, we increased her Lantus to 25 units with improvement in her morning sugar today (down to 160 from 226 yesterday). -Continue glargine 25 units -aspart 5 u tid with meals -SSI with decreased appetite -follow CGBs -Zofran prn for nausea   HTN -continue amlodipine 10mg  daily, entresto 200 mg BID, and spironolactone 25mg  daily   HLD Lipid panel in 1/24 with LDL 87. If lipid panel remains elevated in 3 months plan for PSK9 inhibitor. -atorvastatin 80 mg daily -zetia 10 mg daily -aspirin 81mg  daily -xarelto 10mg  daily  Constipation Patient reports no solid BM in a week and her abdomen feels uncomfortable. We will add on mag citrate today to see if patient can have a BM. We will continue senokot 2 tablets daily and Miralax 17g. -Start Mag Citrate 1 bottle once -Continue senekot 2 tabs daily -Continue Miralax 17g daily   Best Practice: Diet: Carb modified, fluid restriction IVF: Fluids:  None VTE: rivaroxaban (XARELTO) tablet 10 mg Start: 02/06/23 1645 Code: Full Therapy Recs: Home Health PT/OT, DME: none Family Contact: daughter, to be notified. DISPO: Anticipated discharge  2 days  to Home pending  medical stability and fluid diuresis .  Signature: Marisa Cyphers, Medical Student   Please contact the on call pager after 5 pm and on weekends at [phone].  Attestation for Student Documentation:  I personally was present and performed or re-performed the history, physical exam and medical decision-making activities of this service and have verified that the service and findings are accurately documented in the student's note.  Nani Gasser MD 02/09/2023, 2:53 PM

## 2023-02-09 NOTE — Progress Notes (Signed)
Occupational Therapy Treatment Patient Details Name: Alicia [name] MRN: [id number] DOB: [birth year] Today's Date: 02/09/2023   History of present illness 67 yo female presenting 3/16 with SOB and hypoxia to 78% on RA, states she ran out of O2 on her way to ED. Fount to have acute CHF exacerbation and AKI. 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   OT comments  Pt with slow progression towards goals, limited by nausea/fatigues this session, states she feel "weak". Pt provided with reacher and educated on use for LB ADL, pt able to demonstrate. Pt completing bed mobility with supervision and increased time, declines OOB mobility at this time due to nausea. SpO2 88-91% on 5L O2 with bed mobility, RN notified. Pt able to recall 2 strategies for CHF management but would benefit from continued reinforcement. Encouraged OOB mobility later today and pt verbalized understanding. Pt presenting with impairments listed below, will follow acutely. Continue to recommend HHOT at d/c.   Recommendations for follow up therapy are one component of a multi-disciplinary discharge planning process, led by the attending physician.  Recommendations may be updated based on patient status, additional functional criteria and insurance authorization.    Follow Up Recommendations  Home health OT     Assistance Recommended at Discharge Intermittent Supervision/Assistance  Patient can return home with the following  A little help with walking and/or transfers;A lot of help with bathing/dressing/bathroom;Assistance with cooking/housework;Direct supervision/assist for financial management;Direct supervision/assist for medications management;Assist for transportation;Help with stairs or ramp for entrance   Equipment Recommendations  BSC/3in1;Other (comment) (RW)    Recommendations for Other Services PT consult    Precautions / Restrictions  Precautions Precautions: Fall Precaution Comments: watch SpO2, on 4L at baseline Restrictions Weight Bearing Restrictions: No       Mobility Bed Mobility Overal bed mobility: Needs Assistance Bed Mobility: Supine to Sit, Sit to Supine     Supine to sit: Supervision Sit to supine: Supervision   General bed mobility comments: laterally scoots toward HOB with min guard A    Transfers                   General transfer comment: pt declines due to nausea/lethargy     Balance Overall balance assessment: Needs assistance Sitting-balance support: No upper extremity supported, Feet supported Sitting balance-Leahy Scale: Good     Standing balance support: Bilateral upper extremity supported, During functional activity Standing balance-Leahy Scale: Fair Standing balance comment: static stand without DME, BUE support for gait.                           ADL either performed or assessed with clinical judgement   ADL Overall ADL's : Needs assistance/impaired                     Lower Body Dressing: Minimal assistance;With adaptive equipment;Sitting/lateral leans Lower Body Dressing Details (indicate cue type and reason): with use of reacher                    Extremity/Trunk Assessment Upper Extremity Assessment Upper Extremity Assessment: Overall WFL for tasks assessed   Lower Extremity Assessment Lower Extremity Assessment: Defer to PT evaluation        Vision   Vision Assessment?: No apparent visual deficits   Perception Perception Perception: Not tested   Praxis Praxis Praxis: Not tested  Cognition Arousal/Alertness: Awake/alert Behavior During Therapy: WFL for tasks assessed/performed Overall Cognitive Status: Within Functional Limits for tasks assessed                                          Exercises      Shoulder Instructions       General Comments SpO2 88-91% on 5L O2 with sitting  EOB/returning to supine    Pertinent Vitals/ Pain       Pain Assessment Pain Assessment: No/denies pain (nauseaus)  Home Living                                          Prior Functioning/Environment              Frequency  Min 2X/week        Progress Toward Goals  OT Goals(current goals can now be found in the care plan section)  Progress towards OT goals: Progressing toward goals  Acute Rehab OT Goals Patient Stated Goal: none stated OT Goal Formulation: With patient Time For Goal Achievement: 02/21/23 Potential to Achieve Goals: Good ADL Goals Pt Will Perform Upper Body Dressing: with supervision;sitting Pt Will Perform Lower Body Dressing: with supervision;sitting/lateral leans;sit to/from stand Pt Will Transfer to Toilet: with supervision;ambulating;regular height toilet Pt Will Perform Tub/Shower Transfer: Tub transfer;Shower transfer;with supervision;ambulating  Plan Discharge plan remains appropriate;Frequency remains appropriate    Co-evaluation                 AM-PAC OT "6 Clicks" Daily Activity     Outcome Measure   Help from another person eating meals?: None Help from another person taking care of personal grooming?: A Little Help from another person toileting, which includes using toliet, bedpan, or urinal?: A Little Help from another person bathing (including washing, rinsing, drying)?: A Little Help from another person to put on and taking off regular upper body clothing?: A Little Help from another person to put on and taking off regular lower body clothing?: A Little 6 Click Score: 19    End of Session Equipment Utilized During Treatment: Oxygen (5L)  OT Visit Diagnosis: Unsteadiness on feet (R26.81);Other abnormalities of gait and mobility (R26.89);Muscle weakness (generalized) (M62.81)   Activity Tolerance Patient tolerated treatment well   Patient Left in bed;with call bell/phone within reach;with bed alarm set    Nurse Communication Mobility status        Time: [id number] OT Time Calculation (min): 21 min  Charges: OT General Charges $OT Visit: 1 Visit OT Treatments $Self Care/Home Management : 8-22 mins  Renaye Rakers, OTD, OTR/L SecureChat Preferred Acute Rehab [phone] - [place] 02/09/2023, 9:48 AM

## 2023-02-09 NOTE — Progress Notes (Signed)
Physical Therapy Treatment Patient Details Name: Alicia [name] MRN: [id number] DOB: Oct 10, 1956 Today's Date: 02/09/2023   History of Present Illness 67 yo female presenting 3/16 with SOB and hypoxia to 78% on RA, states she ran out of O2 on her way to ED. Fount to have acute CHF exacerbation and AKI. 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    PT Comments    Pt with fair tolerance to treatment today. Pt was able to transfer to [facility] and back to bed today however was limited from completing further mobility due to loose bowel. Majority of session today focused on pericare. Anticipate that pt will be able to mobilize further once loose bowel is under control. No change in DC/DME recs at this time. PT will continue to follow.  Recommendations for follow up therapy are one component of a multi-disciplinary discharge planning process, led by the attending physician.  Recommendations may be updated based on patient status, additional functional criteria and insurance authorization.  Follow Up Recommendations  Home health PT     Assistance Recommended at Discharge Intermittent Supervision/Assistance  Patient can return home with the following A little help with walking and/or transfers;Help with stairs or ramp for entrance   Equipment Recommendations  None recommended by PT    Recommendations for Other Services       Precautions / Restrictions Precautions Precautions: Fall Precaution Comments: watch SpO2, on 4L at baseline Restrictions Weight Bearing Restrictions: No     Mobility  Bed Mobility Overal bed mobility: Needs Assistance Bed Mobility: Sit to Supine       Sit to supine: Supervision        Transfers Overall transfer level: Needs assistance Equipment used: Rolling walker (2 wheels) Transfers: Sit to/from Stand, Bed to chair/wheelchair/BSC Sit to Stand: Min guard   Step pivot  transfers: Min guard            Ambulation/Gait               General Gait Details: Further mobility deferred due to bowel incontinence   Stairs             Wheelchair Mobility    Modified Rankin (Stroke Patients Only)       Balance Overall balance assessment: Needs assistance Sitting-balance support: No upper extremity supported, Feet supported Sitting balance-Leahy Scale: Good     Standing balance support: Bilateral upper extremity supported, During functional activity Standing balance-Leahy Scale: Fair Standing balance comment: Able to stand with RW at [facility] for pericare                            Cognition Arousal/Alertness: Awake/alert Behavior During Therapy: WFL for tasks assessed/performed Overall Cognitive Status: Within Functional Limits for tasks assessed                                          Exercises      General Comments General comments (skin integrity, edema, etc.): VSS on 7L. SpO2 briefly dropped to 87% however quickly recovered to 97% with pursed lip breathing.      Pertinent Vitals/Pain Pain Assessment Pain Assessment: No/denies pain    Home Living  Prior Function            PT Goals (current goals can now be found in the care plan section) Progress towards PT goals: Progressing toward goals    Frequency    Min 3X/week      PT Plan Current plan remains appropriate    Co-evaluation              AM-PAC PT "6 Clicks" Mobility   Outcome Measure  Help needed turning from your back to your side while in a flat bed without using bedrails?: A Little Help needed moving from lying on your back to sitting on the side of a flat bed without using bedrails?: A Little Help needed moving to and from a bed to a chair (including a wheelchair)?: A Little Help needed standing up from a chair using your arms (e.g., wheelchair or bedside chair)?: A Little Help  needed to walk in hospital room?: A Little Help needed climbing 3-5 steps with a railing? : A Lot 6 Click Score: 17    End of Session Equipment Utilized During Treatment: Gait belt;Oxygen Activity Tolerance: Patient tolerated treatment well Patient left: in bed;with call bell/phone within reach;with nursing/sitter in room Nurse Communication: Mobility status PT Visit Diagnosis: Other abnormalities of gait and mobility (R26.89)     Time: [id number] PT Time Calculation (min) (ACUTE ONLY): 33 min  Charges:  $Therapeutic Activity: 23-37 mins                     Shelby Mattocks, PT, DPT Acute Rehab Services [id number]    Viann Shove 02/09/2023, 3:52 PM

## 2023-02-09 NOTE — Progress Notes (Signed)
   Heart Failure Stewardship Pharmacist Progress Note   PCP: Axel Filler, MD PCP-Cardiologist: Candee Furbish, MD    HPI:  67 yo F with PMH of COPD, oxygen dependent, CHF (EF 20-25% in 2015, recovered to 55-60%), CAD, HLD, GERD, T2DM, and CVA.   Admitted in Nov 2023 with acute on chronic CHF exacerbation. Diuresed w/ IV Lasix and treated w/ abx, steroids and nebs. Echo showed normal EF 55-60% and normal RV. After diureses, was transitioned to PO Lasix. Also placed on Entresto.   She was seen in HF TOC 10/2022. Jardiacne increased to 10 mg daily and spironlactone added. She was seen back in [facility] 11/2022 and spironolactone was increased to 25 mg daily. She was later seen at [facility] on 1/30 and lasix was decreased to 80 mg AM and 40 mg PM.  Presented back to the ED on 3/16 with increased shortness of breath, orthopnea, LE edema, chills, and nausea/vomiting. Reports having a recent GI bug and was drinking more fluids. CXR with signs of mild CHF vs PNA. ECHO 3/17 showed LVEF 60-65%, no regional wall motion abnormalities, G2DD, and RV normal. Now being treated for UTI.  Current HF Medications: Diuretic: furosemide 120 mg IV BID + diamox 500 mg daily ACE/ARB/ARNI: Entresto 97/103 mg BID MRA: spironolactone 25 mg daily  Prior to admission HF Medications: Diuretic: furosemide 80 mg BID ACE/ARB/ARNI: Entresto 97/103 mg BID MRA: spironolactone 25 mg daily SGLT2i: Farxiga 10 mg daily  Pertinent Lab Values: Serum creatinine 1.53, BUN 40, Potassium 4.7, Sodium 130, BNP 1437.4, Magnesium 2.3, A1c 8.7   Vital Signs: Weight: 225 lbs (admission weight: 220 lbs) Blood pressure: 120-150/60s  Heart rate: 50s  I/O: -0.1L yesterday; net -1.2L  Medication Assistance / Insurance Benefits Check: Does the patient have prescription insurance?  Yes Type of insurance plan: [place] Medicaid  Outpatient Pharmacy:  Prior to admission outpatient pharmacy: [facility] Is the patient willing to use [facility] at  discharge? Yes Is the patient willing to transition their outpatient pharmacy to utilize a [facility] outpatient pharmacy?   Pending    Assessment: 1. 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 NYHA class III symptoms. - Continue lasix 120 mg IV BID and diamox 500 mg daily. Strict I/Os and daily weights. Keep K>4 and Mg>2. May need metolazone to augment diuresis.  - No BB with bradycardia and COPD - Continue Entresto 97/103 mg BID - Continue spironolactone 25 mg daily - Holding [place] while being treated for UTI, consider resuming once therapy complete   Plan: 1) Medication changes recommended at this time: - Continue IV diuresis, may need metolazone to augment diuresis  2) Patient assistance: - None pending, has [place] Medicaid   3)  Education  - To be completed prior to discharge  Kerby Nora, PharmD, BCPS Heart Failure Stewardship Pharmacist Phone [phone]

## 2023-02-10 LAB — BASIC METABOLIC PANEL
Anion gap: 9 (ref 5–15)
BUN: 45 mg/dL — ABNORMAL HIGH (ref 8–23)
CO2: 31 mmol/L (ref 22–32)
Calcium: 8.7 mg/dL — ABNORMAL LOW (ref 8.9–10.3)
Chloride: 91 mmol/L — ABNORMAL LOW (ref 98–111)
Creatinine, Ser: 1.41 mg/dL — ABNORMAL HIGH (ref 0.44–1.00)
GFR, Estimated: 41 mL/min — ABNORMAL LOW (ref >60)
Glucose, Bld: 157 mg/dL — ABNORMAL HIGH (ref 70–99)
Potassium: 4.7 mmol/L (ref 3.5–5.1)
Sodium: 131 mmol/L — ABNORMAL LOW (ref 135–145)

## 2023-02-10 LAB — CBC
HCT: 29.4 % — ABNORMAL LOW (ref 36.0–46.0)
Hemoglobin: 9.3 g/dL — ABNORMAL LOW (ref 12.0–15.0)
MCH: 25.7 pg — ABNORMAL LOW (ref 26.0–34.0)
MCHC: 31.6 g/dL (ref 30.0–36.0)
MCV: 81.2 fL (ref 80.0–100.0)
Platelets: 319 10*3/uL (ref 150–400)
RBC: 3.62 MIL/uL — ABNORMAL LOW (ref 3.87–5.11)
RDW: 16.4 % — ABNORMAL HIGH (ref 11.5–15.5)
WBC: 9 10*3/uL (ref 4.0–10.5)
nRBC: 0.2 % (ref 0.0–0.2)

## 2023-02-10 LAB — GLUCOSE, CAPILLARY
Glucose-Capillary: 156 mg/dL — ABNORMAL HIGH (ref 70–99)
Glucose-Capillary: 174 mg/dL — ABNORMAL HIGH (ref 70–99)
Glucose-Capillary: 173 mg/dL — ABNORMAL HIGH (ref 70–99)

## 2023-02-10 LAB — MAGNESIUM: Magnesium: 2.1 mg/dL (ref 1.7–2.4)

## 2023-02-10 MED ORDER — METOLAZONE 5 MG PO TABS
5.0000 mg | ORAL_TABLET | Freq: Once | ORAL | Status: AC
  Administered 2023-02-10: 5 mg via ORAL
  Filled 2023-02-10: qty 1

## 2023-02-10 MED ORDER — POLYETHYLENE GLYCOL 3350 17 G PO PACK
17.0000 g | PACK | Freq: Every day | ORAL | Status: DC
  Administered 2023-02-11: 17 g via ORAL
  Filled 2023-02-10: qty 1

## 2023-02-10 MED ORDER — IRON SUCROSE 500 MG IVPB - SIMPLE MED
500.0000 mg | Freq: Once | INTRAVENOUS | Status: AC
  Administered 2023-02-10: 500 mg via INTRAVENOUS
  Filled 2023-02-10: qty 275

## 2023-02-10 NOTE — Progress Notes (Signed)
HD#4 SUBJECTIVE:  Patient Summary: Alicia [name] is a 67 y.o. with a pertinent PMH of systolic heart failure with EF recovery, T2DM, HTN, and advanced COPD on 4 LNC who presented with shortness of breath and admitted for heart failure exacerbation on hospital day 4.  Overnight Events: NAEON. Patient reports that she slept well and denies any chest pain. She said her breathing today is "much better" and currently is on 5LO2 [place]. Patient described her abdomen as much more comfortable since she had two bowel movements yesterday afternoon. She reports that she has urinated quite a lot over the last day. She also reported that her left leg feels better and she can move her toes more.   OBJECTIVE:  Vital Signs: Vitals:   02/10/23 0028 02/10/23 0344 02/10/23 0417 02/10/23 0729  BP: (!) 151/62  (!) 133/56 137/60  Pulse: (!) 56  (!) 57 (!) 103  Resp: 18  18 20   Temp: 98.3 F (36.8 C)  98.3 F (36.8 C) 98.5 F (36.9 C)  TempSrc: Oral  Oral Oral  SpO2: 95%  92% 93%  Weight: 99 kg 99 kg    Height:       Supplemental O2: Nasal Cannula SpO2: 93 % O2 Flow Rate (L/min): 5 L/min  Filed Weights   02/09/23 0500 02/10/23 0028 02/10/23 0344  Weight: 102.3 kg 99 kg 99 kg     Intake/Output Summary (Last 24 hours) at 02/10/2023 [id number] Last data filed at 02/10/2023 [id number] Gross per 24 hour  Intake 265 ml  Output 2300 ml  Net -2035 ml    Net IO Since Admission: -3,306 mL [02/10/23 0832]  Physical Exam: Physical Exam Constitutional:      General: She is not in acute distress.    Appearance: She is obese.  HENT:     Head: Normocephalic and atraumatic.  Neck:     Vascular: JVD present.  Cardiovascular:     Rate and Rhythm: Normal rate and regular rhythm.     Heart sounds: No murmur heard. Pulmonary:     Effort: Pulmonary effort is normal. No respiratory distress.     Breath sounds: Examination of the right-lower field reveals rales. Examination of the left-lower field reveals rales.  Rales present. No wheezing.     Comments: Satting well on 5L O2 [place] Chest:     Chest wall: No tenderness.  Abdominal:     General: Bowel sounds are normal.     Palpations: Abdomen is soft.     Tenderness: There is no abdominal tenderness. There is no guarding.  Musculoskeletal:     Right lower leg: No edema.     Left lower leg: No edema.  Skin:    General: Skin is warm and dry.  Neurological:     General: No focal deficit present.     Mental Status: She is alert.  Psychiatric:        Mood and Affect: Mood normal.        Behavior: Behavior normal.        Pertinent Labs:    Latest Ref Rng & Units 02/10/2023   12:47 AM 02/09/2023   12:48 AM 02/06/2023    1:47 PM  CBC  WBC 4.0 - 10.5 K/uL 9.0  8.7    Hemoglobin 12.0 - 15.0 g/dL 9.3  9.4  10.5   Hematocrit 36.0 - 46.0 % 29.4  30.4  31.0   Platelets 150 - 400 K/uL 319  280  Latest Ref Rng & Units 02/10/2023   12:47 AM 02/09/2023   12:48 AM 02/08/2023   12:41 AM  CMP  Glucose 70 - 99 mg/dL 157  173  218   BUN 8 - 23 mg/dL 45  40  36   Creatinine 0.44 - 1.00 mg/dL 1.41  1.53  1.62   Sodium 135 - 145 mmol/L 131  130  132   Potassium 3.5 - 5.1 mmol/L 4.7  4.7  4.0   Chloride 98 - 111 mmol/L 91  95  94   CO2 22 - 32 mmol/L 31  28  28    Calcium 8.9 - 10.3 mg/dL 8.7  8.6  8.3     Recent Labs    02/09/23 1710 02/09/23 2104 02/10/23 0606  GLUCAP 225* 167* 156*      Pertinent Imaging: No results found.  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 with a pertinent PMH of systolic heart failure with EF recovery, T2DM, HTN, and advanced COPD on 4  LNC who presented with shortness of breath and admitted for heart failure exacerbation on hospital day 4.  Plan: Acute on chronic heart failure exacerbation with recovered EF (EF 60-65%) She is saturating well on 5L O2 nasal cannula. December weight was 97 kg following admission for HFpEF. Weight at 99 kg today, improved from yesterday and reflects patient's improved diuresis from yesterday (approx. 2L output). About 2 kg above dry weight. Patient's exam findings show improved breath sounds with less crackles and less defined JVD. Given the improvement in her diuresis overnight we will continue Lasix at 120mg  BID, continue acetazolamide 500mg  daily, and add a one-time dose of metolazone 5mg  for today. We will reassess her volume status tomorrow and if needed order another dose of metolazone. Will also add IV iron sucrose infusion as it may improve symptoms in patients with heart failure.  -Metolazone 5mg  for one dose  -Continue IV lasix 120 mg BID -Continue acetazolamide 500mg  daily -GDMT with entresto 97-103mg  and spironolactone 25mg  daily, will hold Farxiga in setting of UTI -IV iron sucrose 500mg  once  -she does not take beta blocker in setting of COPD -trend BMP -continuous cardiac monitoring  -replace electrolytes with K>4 and Mg>2 -daily weights  Sinus bradycardia History of sinus node dysfunction per cardiology. Sinus brady around 58 on monitor, stayed in 50s overnight. Hemodynamically stable. Don't suspect this is the underlying cause of her symptoms.   AKI Creatinine above baseline of 0.7 at 1.41 today, improved from yesterday. Likely cardiorenal in setting of fluid overload.  -stricts I and Os -diurese as above -trend BMP  UTI She also noted increased urge without no dysuria, with recent history of chills. UA shows some leukocytes, started treatment for UTI with Macrobid, but switched to cefadroxil per pharmacy recommendation as Macrobid less effective in setting of renal  insufficiency. Will also hold Farxiga in the setting of this UTI until it resolves.  -Continue Cefadroxil 500mg  BID for 4 days, currently on day 3 of 4 (last dose on 3/21 evening) -Hold Farxiga  -monitor kidney function   Chest pain She has been having central chest pain that is sharp  with rest and exertion but denies it today. Troponin 26 to 23 with no ischemic changes on EKG. CT coronary 8/23 showed moderate diffuse and small vessel disease. Don't suspect ACS.   Acute on chronic respiratory failure 2/2 to COPD on 5.5 L [place] Home inhaler is Dulera. Baseline at home 4L [place]. Increased oxygen requirement today, stable on 5.5 L. Will also add Incruse Ellipta to see if this improves her breathing. Also added Duonebs Q6 PRN if patient has worsening shortness of breath. Patient will have Incruse at discharge as it is covered by her insurance.  -Duoneb Q6 PRN -continue Dulera -start Incruse Ellipta -O2 goal of >88% -will discharge with Spiriva per pharmacy    T2DM with hyperglycemia A1c at 8.7 5 days ago. Insulin recently titrated down during clinic visit with hypoglycemia episodes at night. Home regimen includes lantus 20 units, novolog 14 units breakfast/lunch, novolog 20 units before dinner, trulicity 1.5 mg weekly, and farxiga 10 mg daily. Given patient's hyperglycemia, we increased her Lantus to 25 units with improvement in her morning sugars. -Continue glargine 25 units -aspart 5 u tid with meals -SSI with decreased appetite -follow CGBs -Zofran prn for nausea   HTN -continue amlodipine 10mg  daily, entresto 200 mg BID, and spironolactone 25mg  daily   HLD Lipid panel in 1/24 with LDL 87. If lipid panel remains elevated in 3 months plan for PSK9 inhibitor. -atorvastatin 80 mg daily -zetia 10 mg daily -aspirin 81mg  daily -xarelto 10mg  daily  Constipation Patient reported two bowel movements since we added mag citrate to her bowel regimen yesterday. We will hold the mag citrate for now and  continue senokot 2 tablets daily and Miralax 17g to keep patient's bowel movements regular. -Continue senekot 2 tabs daily -Continue Miralax 17g daily    Best Practice: Diet: Carb modified, fluid restriction IVF: Fluids: None VTE: rivaroxaban (XARELTO) tablet 10 mg Start: 02/06/23 1645 Code: Full Therapy Recs: Home Health PT/OT, DME: none Family Contact: daughter, to be notified. DISPO: Anticipated discharge  2 days  to Home pending  medical stability and fluid diuresis .  Signature: Marisa Cyphers, Medical Student   Please contact the on call pager after 5 pm and on weekends at [phone].  Attestation for Student Documentation:  I personally was present and performed or re-performed the history, physical exam and medical decision-making activities of this service and have verified that the service and findings are accurately documented in the student's note.  Nani Gasser MD 02/10/2023, 8:32 AM

## 2023-02-10 NOTE — Progress Notes (Signed)
Physical Therapy Treatment Patient Details Name: Vidalia Oflynn Bramer MRN: [id number] DOB: 08-24-1956 Today's Date: 02/10/2023   History of Present Illness 67 yo female presenting 3/16 with SOB and hypoxia to 78% on RA, states she ran out of O2 on her way to ED. Fount to have acute CHF exacerbation and AKI. 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    PT Comments    Pt with fair tolerance to treatment today. Co treat with OT due to pt fatigue. Pt was able to stand EOB with RW for linen change however due to fatigue further mobility was deferred for safety. Given pt PLOF of independent and current mobility status, updating DC recs to SNF however pt can progress to HHPT if able to tolerate more mobility. PT will continue to follow.    Recommendations for follow up therapy are one component of a multi-disciplinary discharge planning process, led by the attending physician.  Recommendations may be updated based on patient status, additional functional criteria and insurance authorization.  Follow Up Recommendations  Skilled nursing-short term rehab (<3 hours/day) (HHPT pending progress) Can patient physically be transported by private vehicle: Yes   Assistance Recommended at Discharge Intermittent Supervision/Assistance  Patient can return home with the following A little help with walking and/or transfers;Help with stairs or ramp for entrance   Equipment Recommendations  None recommended by PT    Recommendations for Other Services       Precautions / Restrictions Precautions Precautions: Fall Precaution Comments: watch SpO2, on 4L at baseline Restrictions Weight Bearing Restrictions: No     Mobility  Bed Mobility Overal bed mobility: Needs Assistance Bed Mobility: Supine to Sit, Sit to Supine     Supine to sit: Supervision Sit to supine: Supervision   General bed mobility comments: laterally scoots  toward HOB with min guard A    Transfers Overall transfer level: Needs assistance Equipment used: Rolling walker (2 wheels) Transfers: Sit to/from Stand Sit to Stand: Min assist           General transfer comment: Pt was able to stand however further mobility was deferred due to fatigue and safety    Ambulation/Gait                   Stairs             Wheelchair Mobility    Modified Rankin (Stroke Patients Only)       Balance Overall balance assessment: Needs assistance Sitting-balance support: No upper extremity supported, Feet supported Sitting balance-Leahy Scale: Good Sitting balance - Comments: Seated EOB   Standing balance support: Bilateral upper extremity supported, During functional activity Standing balance-Leahy Scale: Fair Standing balance comment: Able to stand with RW                            Cognition Arousal/Alertness: Lethargic Behavior During Therapy: WFL for tasks assessed/performed Overall Cognitive Status: Within Functional Limits for tasks assessed                                          Exercises      General Comments General comments (skin integrity, edema, etc.): Pt received on 4L satting at 93%. Pt briefly dropped to 87% on 4L however was able to recover  to 93% on 4L with pursed lip breathing.      Pertinent Vitals/Pain Pain Assessment Pain Assessment: No/denies pain    Home Living                          Prior Function            PT Goals (current goals can now be found in the care plan section) Progress towards PT goals: Progressing toward goals    Frequency    Min 3X/week      PT Plan Discharge plan needs to be updated    Co-evaluation PT/OT/SLP Co-Evaluation/Treatment: Yes Reason for Co-Treatment: To address functional/ADL transfers;For patient/therapist safety PT goals addressed during session: Mobility/safety with mobility;Proper use of DME OT  goals addressed during session: ADL's and self-care      AM-PAC PT "6 Clicks" Mobility   Outcome Measure  Help needed turning from your back to your side while in a flat bed without using bedrails?: A Little Help needed moving from lying on your back to sitting on the side of a flat bed without using bedrails?: A Little Help needed moving to and from a bed to a chair (including a wheelchair)?: A Little Help needed standing up from a chair using your arms (e.g., wheelchair or bedside chair)?: A Little Help needed to walk in hospital room?: A Little Help needed climbing 3-5 steps with a railing? : A Lot 6 Click Score: 17    End of Session Equipment Utilized During Treatment: Gait belt;Oxygen Activity Tolerance: Patient limited by fatigue Patient left: in bed;with call bell/phone within reach;with bed alarm set Nurse Communication: Mobility status;Other (comment) (O2 sats) PT Visit Diagnosis: Other abnormalities of gait and mobility (R26.89)     Time: [id number] PT Time Calculation (min) (ACUTE ONLY): 27 min  Charges:  $Therapeutic Activity: 8-22 mins                     Shelby Mattocks, PT, DPT Acute Rehab Services [id number]    Viann Shove 02/10/2023, 4:05 PM

## 2023-02-10 NOTE — Progress Notes (Signed)
Occupational Therapy Treatment Patient Details Name: Alicia [name] MRN: [id number] DOB: 07-15-1956 Today's Date: 02/10/2023   History of present illness 67 yo female presenting 3/16 with SOB and hypoxia to 78% on RA, states she ran out of O2 on her way to ED. Fount to have acute CHF exacerbation and AKI. 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   OT comments  Pt with slow progression towards goals this session, needing min-mod A for seated UB/LB ADL. Pt needing supervision for bed mobility and min A +2 for sit to stand transfer with RW. Pt able to stand x1 min before reporting dizziness/fatigue and needing to sit. BP WNL, SpO2 down to 88% at lowest on 4L O2. Cues for purse lip breathing. Pt with slow processing, decreased initiation, needing mod cues to remain attentive to task. Pt presenting with impairments listed below, will follow acutely. Continue to recommend HHOT at d/c pending progression, may need SNF if unable to progress mobility/ADLs.   Recommendations for follow up therapy are one component of a multi-disciplinary discharge planning process, led by the attending physician.  Recommendations may be updated based on patient status, additional functional criteria and insurance authorization.    Follow Up Recommendations  Home heath OT (may need SNF if unable to progress mobility/ADLs)     Assistance Recommended at Discharge Intermittent Supervision/Assistance  Patient can return home with the following  A little help with walking and/or transfers;A lot of help with bathing/dressing/bathroom;Assistance with cooking/housework;Direct supervision/assist for financial management;Direct supervision/assist for medications management;Assist for transportation;Help with stairs or ramp for entrance   Equipment Recommendations  BSC/3in1;Other (comment) (RW)    Recommendations for Other Services PT consult     Precautions / Restrictions Precautions Precautions: Fall Precaution Comments: watch SpO2, on 4L at baseline Restrictions Weight Bearing Restrictions: No       Mobility Bed Mobility Overal bed mobility: Needs Assistance Bed Mobility: Supine to Sit, Sit to Supine     Supine to sit: Supervision Sit to supine: Supervision        Transfers Overall transfer level: Needs assistance Equipment used: Rolling walker (2 wheels) Transfers: Sit to/from Stand Sit to Stand: Min assist, +2 safety/equipment           General transfer comment: stood x 1 min, reporting dizziness and needing to sit BP WNL     Balance Overall balance assessment: Needs assistance Sitting-balance support: No upper extremity supported, Feet supported Sitting balance-Leahy Scale: Good Sitting balance - Comments: Seated EOB   Standing balance support: Bilateral upper extremity supported, During functional activity Standing balance-Leahy Scale: Fair Standing balance comment: Able to stand with RW                           ADL either performed or assessed with clinical judgement   ADL           Upper Body Bathing: Moderate assistance;Sitting Upper Body Bathing Details (indicate cue type and reason): d/t fatigue Lower Body Bathing: Moderate assistance;Sitting/lateral leans Lower Body Bathing Details (indicate cue type and reason): d/t fatigue Upper Body Dressing : Minimal assistance;Sitting Upper Body Dressing Details (indicate cue type and reason): donning gown     Toilet Transfer: Minimal assistance;Rolling walker (2 wheels);+2 for safety/equipment           Functional mobility during ADLs: Minimal assistance;Rolling walker (2 wheels);+2 for safety/equipment  Extremity/Trunk Assessment Upper Extremity Assessment Upper Extremity Assessment: Generalized weakness   Lower Extremity Assessment Lower Extremity Assessment: Defer to PT evaluation        Vision   Vision  Assessment?: No apparent visual deficits   Perception Perception Perception: Not tested   Praxis Praxis Praxis: Not tested    Cognition Arousal/Alertness: Lethargic Behavior During Therapy: WFL for tasks assessed/performed Overall Cognitive Status: Within Functional Limits for tasks assessed                                 General Comments: slow processing, command follow, needs repetition/cues to attend to task        Exercises      Shoulder Instructions       General Comments SpO2 down to 88% on 4L with good wave form, cues for pursed lip breathing    Pertinent Vitals/ Pain       Pain Assessment Pain Assessment: No/denies pain  Home Living                                          Prior Functioning/Environment              Frequency  Min 2X/week        Progress Toward Goals  OT Goals(current goals can now be found in the care plan section)  Progress towards OT goals: Progressing toward goals  Acute Rehab OT Goals Patient Stated Goal: none stated OT Goal Formulation: With patient Time For Goal Achievement: 02/21/23 Potential to Achieve Goals: Good ADL Goals Pt Will Perform Upper Body Dressing: with supervision;sitting Pt Will Perform Lower Body Dressing: with supervision;sitting/lateral leans;sit to/from stand Pt Will Transfer to Toilet: with supervision;ambulating;regular height toilet Pt Will Perform Tub/Shower Transfer: Tub transfer;Shower transfer;with supervision;ambulating  Plan Frequency remains appropriate;Discharge plan needs to be updated    Co-evaluation      Reason for Co-Treatment: To address functional/ADL transfers;For patient/therapist safety PT goals addressed during session: Mobility/safety with mobility;Proper use of DME OT goals addressed during session: ADL's and self-care      AM-PAC OT "6 Clicks" Daily Activity     Outcome Measure   Help from another person eating meals?: None Help  from another person taking care of personal grooming?: A Little Help from another person toileting, which includes using toliet, bedpan, or urinal?: A Little Help from another person bathing (including washing, rinsing, drying)?: A Lot Help from another person to put on and taking off regular upper body clothing?: A Little Help from another person to put on and taking off regular lower body clothing?: A Lot 6 Click Score: 17    End of Session Equipment Utilized During Treatment: Oxygen;Rolling walker (2 wheels) (4L)  OT Visit Diagnosis: Unsteadiness on feet (R26.81);Other abnormalities of gait and mobility (R26.89);Muscle weakness (generalized) (M62.81)   Activity Tolerance Patient tolerated treatment well   Patient Left in bed;with call bell/phone within reach;with bed alarm set   Nurse Communication Mobility status (SpO2)        Time: [id number] OT Time Calculation (min): 27 min  Charges: OT General Charges $OT Visit: 1 Visit OT Treatments $Self Care/Home Management : 8-22 mins  Renaye Rakers, OTD, OTR/L SecureChat [facility] Acute Rehab [phone] - [place] 02/10/2023, 4:52 PM

## 2023-02-10 NOTE — Progress Notes (Signed)
   Heart Failure Stewardship Pharmacist Progress Note   PCP: Axel Filler, MD PCP-Cardiologist: Candee Furbish, MD    HPI:  67 yo F with PMH of COPD, oxygen dependent, CHF (EF 20-25% in 2015, recovered to 55-60%), CAD, HLD, GERD, T2DM, and CVA.   Admitted in Nov 2023 with acute on chronic CHF exacerbation. Diuresed w/ IV Lasix and treated w/ abx, steroids and nebs. Echo showed normal EF 55-60% and normal RV. After diureses, was transitioned to PO Lasix. Also placed on Entresto.   She was seen in HF TOC 10/2022. Jardiacne increased to 10 mg daily and spironlactone added. She was seen back in [facility] 11/2022 and spironolactone was increased to 25 mg daily. She was later seen at [facility] on 1/30 and lasix was decreased to 80 mg AM and 40 mg PM.  Presented back to the ED on 3/16 with increased shortness of breath, orthopnea, LE edema, chills, and nausea/vomiting. Reports having a recent GI bug and was drinking more fluids. CXR with signs of mild CHF vs PNA. ECHO 3/17 showed LVEF 60-65%, no regional wall motion abnormalities, G2DD, and RV normal. Now being treated for UTI.  Current HF Medications: Diuretic: furosemide 120 mg IV BID + diamox 500 mg daily + metolazone 5 mg x 1 ACE/ARB/ARNI: Entresto 97/103 mg BID MRA: spironolactone 25 mg daily Other: IV iron sucrose given 3/20  Prior to admission HF Medications: Diuretic: furosemide 80 mg BID ACE/ARB/ARNI: Entresto 97/103 mg BID MRA: spironolactone 25 mg daily SGLT2i: Farxiga 10 mg daily  Pertinent Lab Values: Serum creatinine 1.41, BUN 45, Potassium 4.7, Sodium 131, BNP 1437.4, Magnesium 2.1, A1c 8.7   Vital Signs: Weight: 218 lbs (admission weight: 220 lbs) Blood pressure: 120/60s  Heart rate: 50s  I/O: -1.5L yesterday; net -3.2L  Medication Assistance / Insurance Benefits Check: Does the patient have prescription insurance?  Yes Type of insurance plan: [place] Medicaid  Outpatient Pharmacy:  Prior to admission outpatient pharmacy:  [facility] Is the patient willing to use [place] at discharge? Yes Is the patient willing to transition their outpatient pharmacy to utilize a [facility] outpatient pharmacy?   Pending    Assessment: 1. 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 NYHA class III symptoms. - Diuresis improved yesterday with addition of metolazone. Continue lasix 120 mg IV BID and diamox 500 mg daily and agree with repeating metolazone today. Strict I/Os and daily weights. Keep K>4 and Mg>2.  - No BB with bradycardia and COPD - Continue Entresto 97/103 mg BID - Continue spironolactone 25 mg daily - Holding [place] while being treated for UTI, consider resuming once therapy complete. Currently on day 3 of 4.  - IV iron given 3/20 - no recent iron labs. Recommend checking in about 2 months.    Plan: 1) Medication changes recommended at this time: - Continue IV diuresis with metolazone to augment diuresis  2) Patient assistance: - None pending, has [place] Medicaid   3)  Education  - Patient has been educated on current HF medications and potential additions to HF medication regimen - Patient verbalizes understanding that over the next few months, these medication doses may change and more medications may be added to optimize HF regimen - Patient has been educated on basic disease state pathophysiology and goals of therapy   Kerby Nora, PharmD, BCPS Heart Failure Stewardship Pharmacist Phone [phone]

## 2023-02-11 ENCOUNTER — Other Ambulatory Visit (HOSPITAL_COMMUNITY): Payer: Self-pay

## 2023-02-11 DIAGNOSIS — E113393 Type 2 diabetes mellitus with moderate nonproliferative diabetic retinopathy without macular edema, bilateral: Secondary | ICD-10-CM

## 2023-02-11 DIAGNOSIS — D649 Anemia, unspecified: Secondary | ICD-10-CM

## 2023-02-11 DIAGNOSIS — Z794 Long term (current) use of insulin: Secondary | ICD-10-CM

## 2023-02-11 DIAGNOSIS — N3000 Acute cystitis without hematuria: Secondary | ICD-10-CM

## 2023-02-11 LAB — BASIC METABOLIC PANEL
Anion gap: 9 (ref 5–15)
BUN: 52 mg/dL — ABNORMAL HIGH (ref 8–23)
CO2: 31 mmol/L (ref 22–32)
Calcium: 8.7 mg/dL — ABNORMAL LOW (ref 8.9–10.3)
Chloride: 89 mmol/L — ABNORMAL LOW (ref 98–111)
Creatinine, Ser: 1.42 mg/dL — ABNORMAL HIGH (ref 0.44–1.00)
GFR, Estimated: 41 mL/min — ABNORMAL LOW (ref >60)
Glucose, Bld: 151 mg/dL — ABNORMAL HIGH (ref 70–99)
Potassium: 4.3 mmol/L (ref 3.5–5.1)
Sodium: 129 mmol/L — ABNORMAL LOW (ref 135–145)

## 2023-02-11 LAB — MAGNESIUM: Magnesium: 2.2 mg/dL (ref 1.7–2.4)

## 2023-02-11 LAB — GLUCOSE, CAPILLARY
Glucose-Capillary: 142 mg/dL — ABNORMAL HIGH (ref 70–99)
Glucose-Capillary: 204 mg/dL — ABNORMAL HIGH (ref 70–99)

## 2023-02-11 MED ORDER — TORSEMIDE 20 MG PO TABS
60.0000 mg | ORAL_TABLET | Freq: Every day | ORAL | 0 refills | Status: DC
  Filled 2023-02-11: qty 90, 30d supply, fill #0

## 2023-02-11 MED ORDER — TIOTROPIUM BROMIDE MONOHYDRATE 18 MCG IN CAPS
18.0000 ug | ORAL_CAPSULE | Freq: Every day | RESPIRATORY_TRACT | 2 refills | Status: DC
  Filled 2023-02-11: qty 30, 30d supply, fill #0

## 2023-02-11 NOTE — Progress Notes (Signed)
Physical Therapy Treatment Patient Details Name: Alicia [name] MRN: [id number] DOB: 03/04/1956 Today's Date: 02/11/2023   History of Present Illness 67 yo female presenting 3/16 with SOB and hypoxia to 78% on RA, states she ran out of O2 on her way to ED. Fount to have acute CHF exacerbation and AKI. 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    PT Comments    Pt is in chair when PT arrives, has demonstrated good effort with exercises but declines to try to stand and take steps.  Pt reports she is expecting to leave today, and PT discussed our recommendations with her.  Pt is going home with her daughter, and have discussed home therapy along with equipment to cover her change of plans.  Pt has not walked with PT yet so will continue to recommend her to SNF care for strengthening and recovery of her PLOF.  Follow acutely as pt will permit to increase safety and independence as is possible.   Recommendations for follow up therapy are one component of a multi-disciplinary discharge planning process, led by the attending physician.  Recommendations may be updated based on patient status, additional functional criteria and insurance authorization.  Follow Up Recommendations  Skilled nursing-short term rehab (<3 hours/day) Can patient physically be transported by private vehicle: Yes   Assistance Recommended at Discharge Intermittent Supervision/Assistance  Patient can return home with the following A little help with walking and/or transfers;A little help with bathing/dressing/bathroom;Assistance with cooking/housework;Direct supervision/assist for financial management;Assist for transportation;Help with stairs or ramp for entrance   Equipment Recommendations  Rolling walker (2 wheels);BSC/3in1    Recommendations for Other Services       Precautions / Restrictions Precautions Precautions: Fall Precaution  Comments: watch SpO2, on 4L at baseline Restrictions Weight Bearing Restrictions: No     Mobility  Bed Mobility               General bed mobility comments: up in chair    Transfers Overall transfer level: Needs assistance                 General transfer comment: declines to stand    Ambulation/Gait                   Stairs             Wheelchair Mobility    Modified Rankin (Stroke Patients Only)       Balance Overall balance assessment: Needs assistance Sitting-balance support: Feet supported Sitting balance-Leahy Scale: Good Sitting balance - Comments: in chair                                    Cognition Arousal/Alertness: Awake/alert Behavior During Therapy: WFL for tasks assessed/performed Overall Cognitive Status: Within Functional Limits for tasks assessed                                 General Comments: pt is committed to home despite not having walked with rehab yet        Exercises      General Comments General comments (skin integrity, edema, etc.): pt is up to chair when PT arrives and is admant that she is going home today despite PT recommendations.  States she can manage with  daughter who is not in attendance to visit      Pertinent Vitals/Pain Pain Assessment Pain Assessment: No/denies pain    Home Living                          Prior Function            PT Goals (current goals can now be found in the care plan section) Acute Rehab PT Goals Patient Stated Goal: return home Progress towards PT goals: Not progressing toward goals - comment    Frequency    Min 1X/week      PT Plan Frequency needs to be updated;Equipment recommendations need to be updated    Co-evaluation              AM-PAC PT "6 Clicks" Mobility   Outcome Measure  Help needed turning from your back to your side while in a flat bed without using bedrails?: A Little Help needed  moving from lying on your back to sitting on the side of a flat bed without using bedrails?: A Little Help needed moving to and from a bed to a chair (including a wheelchair)?: A Little Help needed standing up from a chair using your arms (e.g., wheelchair or bedside chair)?: A Little Help needed to walk in hospital room?: Total Help needed climbing 3-5 steps with a railing? : Total 6 Click Score: 14    End of Session Equipment Utilized During Treatment: Oxygen Activity Tolerance: Patient limited by fatigue;Treatment limited secondary to medical complications (Comment) Patient left: in chair;with call bell/phone within reach;with chair alarm set Nurse Communication: Mobility status;Other (comment) (sats) PT Visit Diagnosis: Other abnormalities of gait and mobility (R26.89)     Time: [id number] PT Time Calculation (min) (ACUTE ONLY): 26 min  Charges:  $Therapeutic Exercise: 23-37 mins       Ramond Dial 02/11/2023, 12:47 PM  Mee Hives, PT PhD Acute Rehab Dept. Number: [place] [facility] [place]

## 2023-02-11 NOTE — Discharge Summary (Addendum)
Name: Alicia [name] MRN: [id number] DOB: Nov 01, 1956 67 y.o. PCP: Axel Filler, MD  Date of Admission: 02/06/2023  1:00 PM Date of Discharge: 02/11/2023 3:28 PM Attending Physician: Lucious Groves, DO  Discharge Diagnosis: Active Problems:   Type 2 diabetes mellitus with moderate nonproliferative diabetic retinopathy ([place])   Essential hypertension   Sinus bradycardia   Normocytic anemia Acute on Chronic hypoxic respiratory failure Acute on Chronic Heart failure with recovered Ejection Fraction Iron Deficiency Anemia UTI COPD  Discharge Medications: Allergies as of 02/11/2023       Reactions   Beta Adrenergic Blockers Other (See Comments)   Sinus pauses   Canagliflozin Itching, Anxiety, Palpitations        Medication List     STOP taking these medications    dapagliflozin propanediol 10 MG Tabs tablet Commonly known as: Farxiga   dapagliflozin propanediol 5 MG Tabs tablet Commonly known as: FARXIGA   furosemide 40 MG tablet Commonly known as: LASIX   triamcinolone 0.025 % cream Commonly known as: KENALOG       TAKE these medications    Accu-Chek Aviva Plus test strip Generic drug: glucose blood USE TO TEST BLOOD SUGARS AS DIRECTED   accu-chek multiclix lancets Use to check your blood sugar four times daily: early morning, before a meal, two hours after a meal, and bedtime   albuterol 108 (90 Base) MCG/ACT inhaler Commonly known as: VENTOLIN HFA TAKE 2 PUFFS BY MOUTH EVERY 6 HOURS AS NEEDED   amLODipine 10 MG tablet Commonly known as: NORVASC TAKE 1 TABLET BY MOUTH EVERY DAY   aspirin 81 MG tablet Take 81 mg by mouth daily.   atorvastatin 80 MG tablet Commonly known as: LIPITOR Take 1 tablet (80 mg total) by mouth daily.   Entresto 97-103 MG Generic drug: sacubitril-valsartan Take 1 tablet by mouth 2 (two) times daily.   ezetimibe 10 MG tablet Commonly known as: ZETIA Take 1 tablet (10 mg total) by mouth daily.   insulin  glargine 100 UNIT/ML injection Commonly known as: LANTUS Inject 0.2 mLs (20 Units total) into the skin at bedtime.   insulin lispro 100 UNIT/ML KwikPen Commonly known as: HUMALOG Inject 14 units before breakfast and lunch, inject 20 units before dinner   Insulin Pen Needle 32G X 4 MM Misc Use to inject insulin 4 times a day. The patient is insulin requiring, ICD 10 code 11.10. The patient injects 4 times per day.   B-D UF III MINI PEN NEEDLES 31G X 5 MM Misc Generic drug: Insulin Pen Needle USE 3 TIMES A DAY WITH HUMALOG   Insulin Syringe-Needle U-100 31G X 15/64" 0.3 ML Misc Use to inject Humalog before meals three times a day   mometasone-formoterol 100-5 MCG/ACT Aero Commonly known as: DULERA Inhale 2 puffs into the lungs 2 (two) times daily.   nystatin powder Commonly known as: nystatin Apply 1 Application topically 3 (three) times daily.   senna 8.6 MG Tabs tablet Commonly known as: SENOKOT Take 2 tablets (17.2 mg total) by mouth daily.   spironolactone 25 MG tablet Commonly known as: ALDACTONE Take 1 tablet (25 mg total) by mouth daily.   tiotropium 18 MCG inhalation capsule Commonly known as: Spiriva HandiHaler Place 1 capsule (18 mcg total) into inhaler and inhale daily.   torsemide 20 MG tablet Commonly known as: DEMADEX Take 3 tablets (60 mg total) by mouth daily.   Trulicity [id number] Sopn Generic drug: Dulaglutide Inject 0.75 mg into the skin once a  week.               Durable Medical Equipment  (From admission, onward)           Start     Ordered   02/11/23 1354  DME 3-in-1  Once        02/11/23 1357   02/11/23 1354  DME Walker  Once       Question Answer Comment  Walker: With 5 Inch Wheels   Patient needs a walker to treat with the following condition Acute hypoxic respiratory failure ([place])      02/11/23 1357   02/11/23 1241  For home use only DME Bedside commode  Once       Question:  Patient needs a bedside commode to treat  with the following condition  Answer:  Weakness   02/11/23 1240   02/11/23 1240  For home use only DME Walker rolling  Once       Question Answer Comment  Walker: With [place]   Patient needs a walker to treat with the following condition Weakness      02/11/23 1239            Follow-up Appointments:  Follow-up Information     [facility]. Go to.   Specialty: Cardiology Why: Hospital follow up 02/24/2023 @ 11 am  PLEASE bring a current medication list to appointment FREE valet parking, Entrance C, off [employer] information: [address] [id number] [place]. Follow up.   Why: Agency will call you to set up apt times Contact information: [place] [address] [phone]         [employer] Follow up.   Why: rolling walker - deliver to room bedside commode- deliver to home Contact information: [place]        Riesa Pope, MD. Go on 02/16/2023.   Specialty: Internal Medicine Why: Please arrive 15 minutes early for your appointment at 10:45 a.m. Contact information: [place] [address] [phone]                 Disposition and follow-up: Ms. [name] is a 67 y.o. year old hospitalized for acute decompensated heart failure.  Acute decompensated heart failure Chronic systolic heart failure with recovered ejection fraction Presented with acute on chronic hypoxic respiratory failure.  Diuresed after uptitrating to 120 mg IV furosemide, metolazone, and acetazolamide.  In setting of increased fluid intake at home.  Status post IV iron.  Discharged on the following: - Entresto 200 - Amlodipine 10 - Spironolactone 25 - Home Lasix 80 mg twice daily DC'd in favor of torsemide 60 mg daily - Farxiga held due to UTI - No beta-blockers due to sinus  bradycardia and history of bronchospasm  AKI Thought to be cardiorenal, stable at 1.42 with EGFR 41 on discharge.  Baseline creatinine around 1. - Follow-up BMP  UTI Increased urinary frequency and hesitancy. - Status post 5 days of cefadroxil - Farxiga held on discharge  COPD Baseline 4 L via nasal cannula.  Added LAMA. - Spiriva - Dulera  Insulin-dependent diabetes Discharged on home regimen.  Transitions of care Declined home health PT/OT and SNF for subacute rehab.  Home with daughter.  Hospital Course by problem list: Active Problems:   Type 2 diabetes mellitus with moderate nonproliferative  diabetic retinopathy ([place])   Essential hypertension   Sinus bradycardia   Normocytic anemia  Principal Problem (Resolved):   Acute exacerbation of CHF (congestive heart failure) (H[facility]) Resolved Problems:   UTI (urinary tract infection)   Acute on chronic hypoxic respiratory failure ([facility])   Iron deficiency  Acute on chronic heart failure exacerbation with recovered EF of 60 to 65% Presented with dyspnea on exertion and increased oxygen requirement.  No ACS, no PE, no infection.  On admission she required 7 or 8L O2 nasal cannula but was weaned back to 4L baseline at discharge. December weight was 97 kg following admission for HFpEF. Weight at 97 kg today, reflects patient's improved diuresis during hospitalization (approx. 3-4L output). Patient's exam findings show clear breath sounds and no JVD. Diuresis regimen during hospitalization included IV Lasix at 120mg  BID, acetazolamide 500mg  daily, and two doses of metolazone 5mg .  Status post IV iron sucrose. Will discharge on torsemide 60mg  daily.  Also Entresto 200, amlodipine 10, spironolactone 25.  [place] held for UTI.  No beta-blockers due to sinus bradycardia and history of bronchospasm.  AKI Likely cardiorenal in setting of fluid overload.  Creatinine stable 1.42 on discharge, up from baseline of around 0.7-1.  UTI Urinary  hesitancy and increased frequency.  Treated with cefadroxil for 5 days.  [place] held on discharge.  COPD Complicating this patient's heart failure syndrome.  Home inhaler (LABA ICS) was continued with addition of LAMA prior to discharge.  Discharge Exam: Patient states that she is breathing much better and her legs feel great. She doesn't have to sleep with multiple pillows anymore and is back to her baseline oxygen requirement. She is ready to go home and declined rehab or home health PT.    Blood pressure 109/72, pulse 63, temperature 97.7 F (36.5 C), temperature source Oral, resp. rate 20, height 5\' 7"  (1.702 m), weight 97 kg, SpO2 99 %.  Constitutional: well-appearing female sitting in chair, in no acute distress HENT: normocephalic atraumatic, mucous membranes moist Eyes: conjunctiva non-erythematous Neck: supple Cardiovascular: regular rate and rhythm, no m/r/g Pulmonary/Chest: normal work of breathing on room air, lungs clear to auscultation bilaterally Abdominal: soft, non-tender, non-distended MSK: normal bulk and tone Neurological: alert & oriented x 3, 5/5 strength in bilateral upper and lower extremities, normal gait Skin: warm and dry Psych: normal mood and affect    Pertinent studies and procedures: Echo EF 60 to 65% with grade 2 diastolic dysfunction and no hemodynamically significant valvular abnormalities. Imaging Orders         DG Chest 2 View         DG Abd 1 View    Lab Orders         Resp panel by RT-PCR (RSV, Flu A&B, Covid) Anterior Nasal Swab         CBC with Differential/Platelet         Comprehensive metabolic panel         Lipase, blood         Urinalysis, Routine w reflex microscopic -Urine, Clean Catch         Brain natriuretic peptide         Magnesium         Basic metabolic panel         Glucose, capillary         Glucose, capillary         Glucose, capillary         Glucose, capillary  Glucose, capillary         Glucose, capillary          Magnesium         Glucose, capillary         CBC         Glucose, capillary         Glucose, capillary         Glucose, capillary         Glucose, capillary         Basic metabolic panel         Magnesium         CBC         Glucose, capillary         Glucose, capillary         Glucose, capillary         Glucose, capillary         Basic metabolic panel         Magnesium         Glucose, capillary         Glucose, capillary         Glucose, capillary         I-stat chem 8, ed     Discharge Instructions:   Discharge Instructions      To Ms. Deashia Peri Salton or their caretakers,  They were evaluated and treated in the hospital for:  Active Problems:   Type 2 diabetes mellitus with moderate nonproliferative diabetic retinopathy ([place])   Essential hypertension   Sinus bradycardia   Normocytic anemia  Principal Problem (Resolved):   Acute exacerbation of CHF (congestive heart failure) ([place]) Resolved Problems:   UTI (urinary tract infection)   Acute on chronic hypoxic respiratory failure (HCC)   Iron deficiency  The evaluation suggested acute on chronic heart failure with recovered ejection fraction. They were treated with high-dose IV furosemide, metolazone, and acetazolamide.  These are medicines that help the kidneys get rid of excess fluid.  They were discharged from the hospital on 02/11/23. I recommend the following after leaving the hospital:   Start taking the following medicine: - Torsemide 60 mg daily - Spiriva 1 puff daily  For now, stop taking: - Farxiga - Lasix  You have a follow-up appointment with Dr. Johnney Ou on February 16, 2023 at 10:45 AM.  Be mindful of the amount of fluid you taken throughout the day.  This includes water, soda, milk, alcohol, coffee, tea, soup-even some fruits, like watermelon, have a lot of fluid in them that can contribute to your daily total.  Drink if you are thirsty, but try to avoid beverages for the sake of  enjoyment.  Begin weighing yourself daily.  Call your doctor if you gain 3 pounds in a day or 5 pounds in a week.  Nani Gasser MD 02/11/2023, 1:45 PM      Nani Gasser MD 02/11/2023, 3:28 PM

## 2023-02-11 NOTE — TOC Benefit Eligibility Note (Signed)
Patient Teacher, English as a foreign language completed.    The patient is currently admitted and upon discharge could be taking Breztri 160-9-4.8 mcg/act.  The current 30 day co-pay is $0.00.   The patient is currently admitted and upon discharge could be taking Trelegy 100-62.5-25 mcg/act.  The current 30 day co-pay is $0.00.   The patient is insured through [employer], [place] Patient Advocate Specialist [place] Patient Advocate Team Direct Number: [phone]  Fax: [phone]

## 2023-02-11 NOTE — TOC Progression Note (Addendum)
Transition of Care [facility]) - Progression Note    Patient Details  Name: Alicia [name] MRN: [id number] Date of Birth: [birth year]  Transition of [facility]) CM/SW Contact  Zenon Mayo, RN Phone Number: 02/11/2023, 10:15 AM  Clinical Narrative:    Patient is refusing [facility] services now and SNF.  NCM notified Amy with [facility]. Patient will need BSC and rolling walker.  NCM offered choice, she states she does not have a preference.  NCM made referral to [facility] with [facility] for [facility] and rolling walker.  The walker will be delivered to the room and bsc will be delivered to patient's home. Ashlin with [facility] brought the longer tubing for her oxygen up to patient room and he explained to patient how to use the tubing.          Expected Discharge Plan and Services                                               Social Determinants of Health (SDOH) Interventions SDOH Screenings   Food Insecurity: No Food Insecurity (02/07/2023)  Housing: Low Risk  (02/06/2023)  Transportation Needs: No Transportation Needs (02/07/2023)  Utilities: Not At Risk (02/07/2023)  Alcohol Screen: Low Risk  (02/08/2023)  Depression (PHQ2-9): Low Risk  (02/01/2023)  Financial Resource Strain: Low Risk  (02/08/2023)  Physical Activity: Insufficiently Active (09/28/2022)  Social Connections: Socially Isolated (09/28/2022)  Stress: No Stress Concern Present (09/28/2022)  Tobacco Use: Medium Risk (02/09/2023)    Readmission Risk Interventions     No data to display

## 2023-02-11 NOTE — Discharge Summary (Incomplete)
Name: Orion Overbaugh Gumina MRN: [id number] DOB: 04-12-1956 67 y.o. PCP: Axel Filler, MD  Date of Admission: 02/06/2023  1:00 PM Date of Discharge: 02/11/2023 Attending Physician: Dr. Angelia Mould  Discharge Diagnosis: Active Problems:   Type 2 diabetes mellitus with moderate nonproliferative diabetic retinopathy ([place])   Essential hypertension   Sinus bradycardia   Normocytic anemia    Discharge Medications: Allergies as of 02/11/2023       Reactions   Beta Adrenergic Blockers Other (See Comments)   Sinus pauses   Canagliflozin Itching, Anxiety, Palpitations     Med Rec must be completed prior to using this [facility]***        Durable Medical Equipment  (From admission, onward)           Start     Ordered   02/11/23 1241  For home use only DME Bedside commode  Once       Question:  Patient needs a bedside commode to treat with the following condition  Answer:  Weakness   02/11/23 1240   02/11/23 1240  For home use only DME Walker rolling  Once       Question Answer Comment  Walker: With [place]   Patient needs a walker to treat with the following condition Weakness      02/11/23 1239            Disposition and follow-up:   Ms.Ceanna M Baris was discharged from [facility] in Stable condition.  At the hospital follow up visit please address:  1.  Follow-up:  a. Volume status, effectiveness of torsemide, soda intake    b. AKI resolution   c. UTI symptom resolution   d. COPD: oxygenation at baseline 4L O2   E. T2DM follow up on blood sugars   F. Constipation  2.  Labs / imaging needed at time of follow-up: BMP, A1c, CBC  3.  Pending labs/ test needing follow-up: none  4.  Medication Changes  Started: Spiriva, Torsemide   Stopped: Lasix, Farxiga  Abx -  Cefadroxil End Date: 02/11/23   Follow-up Appointments: 02/16/2023 10:45am  IMTS clinic with Dr. Johnney Ou    Follow-up Information     [facility]. Go to.   Specialty: Cardiology Why: Hospital follow up 02/24/2023 @ 11 am  PLEASE bring a current medication list to appointment FREE valet parking, Entrance C, off [employer] information: [id number] [place]. Follow up.   Why: Agency will call you to set up apt times Contact information: [place] [address] [phone]         [employer] Follow up.   Why: rolling walker - deliver to room bedside commode- deliver to home Contact information: [place]        Riesa Pope, MD. Go on 02/16/2023.   Specialty: Internal Medicine Why: Please arrive 15 minutes early for your appointment at 10:45 a.m. Contact information: [place] [address] [place] Hospital Course by problem list:  Principal Problem:   Acute exacerbation of CHF (congestive heart failure) (HCC) Active Problems:   Type 2 diabetes mellitus with moderate nonproliferative diabetic retinopathy ([place])   Essential hypertension   UTI (urinary tract infection)  Sinus bradycardia   Acute on chronic hypoxic respiratory failure (HCC)  Acute on chronic heart failure exacerbation with recovered EF (EF 60-65%) On admission she required 7 or 8L O2 nasal cannula but was weaned back to 4L baseline at discharge. December weight was 97 kg following admission for HFpEF. Weight at 97 kg today, reflects patient's improved diuresis during hospitalization (approx. 3-4L output). Patient's exam findings show clear breath sounds and no JVD. Diuresis regimen during hospitalization included IV Lasix at 120mg  BID, acetazolamide 500mg  daily, and two doses of metolazone 5mg . Gave IV iron sucrose infusion as it may improve symptoms in patients with heart failure. Will discharge on torsemide 60mg  daily.    Sinus  bradycardia History of sinus node dysfunction per cardiology. Sinus brady around 50s on monitor during hospitalization. Hemodynamically stable. Don't suspect this is the underlying cause of her symptoms.   AKI Creatinine above baseline of 0.7 at 1.42 today, improving AKI since hospitalization. Likely cardiorenal in setting of fluid overload.     UTI She also noted increased urge without no dysuria, with recent history of chills. UA showed some leukocytes, started treatment for UTI with Macrobid, but switched to cefadroxil per pharmacy recommendation as Macrobid less effective in setting of renal insufficiency. Will also hold Farxiga in the setting of this UTI until it resolves.  Cefadroxil course is finished on 3/21 evening.   Chest pain She has been having central chest pain that is sharp with rest and exertion but denies it today. Troponin 26 to 23 with no ischemic changes on EKG. CT coronary 8/23 showed moderate diffuse and small vessel disease. Don't suspect ACS.   Acute on chronic respiratory failure 2/2 to COPD on 5.5 L [place] Home inhaler is Dulera. Baseline at home 4L . Will add Spiriva at discharge to see if this improves her breathing. Counseled patient on using Albuterol as a rescue inhaler. Patient will have Spiriva at discharge as it is covered by her insurance.   T2DM with hyperglycemia A1c at 8.7 5 days ago. Insulin recently titrated down during last clinic visit with hypoglycemia episodes at night. Home regimen includes lantus 20 units, novolog 14 units breakfast/lunch, novolog 20 units before dinner, trulicity 1.5 mg weekly, and farxiga 10 mg daily. Given patient's hyperglycemia during her hospitalization, we increased her Lantus to 25 units with improvement in her morning sugars. Will resume home regimen at discharge.  HTN Continue amlodipine 10mg  daily, entresto 200 mg BID, and spironolactone 25mg  daily   HLD Lipid panel in 1/24 with LDL 87. If lipid panel remains elevated in  3 months consider PSK9 inhibitor.   Constipation Patient had not had a bowel movement in about a week when she was admitted. She was taking Senokot at home prior to admission and we added Miralax to her regimen. She reported that she was still constipated until we added a one-time dose of mag citrate. Will continue senokot 2 tablets daily and Miralax 17g after discharge to keep patient's bowel movements regular.   Discharge Subjective: Patient states that she is breathing much better and her legs feel great. She doesn't have to sleep with multiple pillows anymore and is back to her baseline oxygen requirement. She is ready to go home and declined rehab or home health PT.   Discharge Exam:   BP 109/72 (BP Location: Left Arm)   Pulse 63   Temp 97.7 F (36.5 C) (Oral)   Resp 20   Ht 5\' 7"  (1.702 m)   Wt 97 kg  LMP  (LMP Unknown)   SpO2 99%   BMI 33.49 kg/m  Constitutional: well-appearing female sitting in chair, in no acute distress HENT: normocephalic atraumatic, mucous membranes moist Eyes: conjunctiva non-erythematous Neck: supple Cardiovascular: regular rate and rhythm, no m/r/g Pulmonary/Chest: normal work of breathing on room air, lungs clear to auscultation bilaterally Abdominal: soft, non-tender, non-distended MSK: normal bulk and tone Neurological: alert & oriented x 3, 5/5 strength in bilateral upper and lower extremities, normal gait Skin: warm and dry Psych: normal mood and affect    Pertinent Labs, Studies, and Procedures:     Latest Ref Rng & Units 02/10/2023   12:47 AM 02/09/2023   12:48 AM 02/06/2023    1:47 PM  CBC  WBC 4.0 - 10.5 K/uL 9.0  8.7    Hemoglobin 12.0 - 15.0 g/dL 9.3  9.4  10.5   Hematocrit 36.0 - 46.0 % 29.4  30.4  31.0   Platelets 150 - 400 K/uL 319  280         Latest Ref Rng & Units 02/11/2023   12:47 AM 02/10/2023   12:47 AM 02/09/2023   12:48 AM  CMP  Glucose 70 - 99 mg/dL 151  157  173   BUN 8 - 23 mg/dL 52  45  40   Creatinine 0.44 -  1.00 mg/dL 1.42  1.41  1.53   Sodium 135 - 145 mmol/L 129  131  130   Potassium 3.5 - 5.1 mmol/L 4.3  4.7  4.7   Chloride 98 - 111 mmol/L 89  91  95   CO2 22 - 32 mmol/L 31  31  28    Calcium 8.9 - 10.3 mg/dL 8.7  8.7  8.6     ECHOCARDIOGRAM COMPLETE  Result Date: 02/07/2023    ECHOCARDIOGRAM REPORT   Patient Name:   RILEY LEAKEY Date of Exam: 02/07/2023 Medical Rec #:  [id number]       Height:       67.0 in Accession #:    [id number]      Weight:       224.4 lb Date of Birth:  17-Dec-1955        BSA:          2.124 m Patient Age:    24 years        BP:           139/60 mmHg Patient Gender: F               HR:           53 bpm. Exam Location:  Inpatient Procedure: 2D Echo, 3D Echo, Cardiac Doppler, Color Doppler and Strain Analysis Indications:    CHF  History:        Patient has prior history of Echocardiogram examinations, most                 recent 10/12/2022. CHF, CAD, COPD; Risk Factors:Diabetes,                 Hypertension, Former Smoker and Dyslipidemia.  Sonographer:    Leavy Cella RDCS Referring Phys: [id number] [place]  1. Left ventricular ejection fraction, by estimation, is 60 to 65%. The left ventricle has normal function. The left ventricle has no regional wall motion abnormalities. There is moderate left ventricular hypertrophy. Left ventricular diastolic parameters are consistent with Grade II diastolic dysfunction (pseudonormalization).  2. Right ventricular systolic function is normal. The right ventricular size is normal.  3.  Left atrial size was mildly dilated.  4. The mitral valve is normal in structure. Mild mitral valve regurgitation. No evidence of mitral stenosis.  5. The aortic valve is normal in structure. Aortic valve regurgitation is not visualized. No aortic stenosis is present.  6. The inferior vena cava is normal in size with greater than 50% respiratory variability, suggesting right atrial pressure of 3 mmHg. Comparison(s): No significant change from prior  study. Prior images reviewed side by side. FINDINGS  Left Ventricle: Left ventricular ejection fraction, by estimation, is 60 to 65%. The left ventricle has normal function. The left ventricle has no regional wall motion abnormalities. The left ventricular internal cavity size was normal in size. There is  moderate left ventricular hypertrophy. Left ventricular diastolic parameters are consistent with Grade II diastolic dysfunction (pseudonormalization). Right Ventricle: The right ventricular size is normal. No increase in right ventricular wall thickness. Right ventricular systolic function is normal. Left Atrium: Left atrial size was mildly dilated. Right Atrium: Right atrial size was normal in size. Pericardium: There is no evidence of pericardial effusion. Mitral Valve: The mitral valve is normal in structure. Mild mitral valve regurgitation. No evidence of mitral valve stenosis. Tricuspid Valve: The tricuspid valve is normal in structure. Tricuspid valve regurgitation is mild . No evidence of tricuspid stenosis. Aortic Valve: The aortic valve is normal in structure. Aortic valve regurgitation is not visualized. No aortic stenosis is present. Pulmonic Valve: The pulmonic valve was normal in structure. Pulmonic valve regurgitation is not visualized. No evidence of pulmonic stenosis. Aorta: The aortic root is normal in size and structure. Venous: The inferior vena cava is normal in size with greater than 50% respiratory variability, suggesting right atrial pressure of 3 mmHg. IAS/Shunts: No atrial level shunt detected by color flow Doppler.  LEFT VENTRICLE PLAX 2D LVIDd:         5.80 cm   Diastology LVIDs:         3.20 cm   LV e' medial:    5.98 cm/s LV PW:         1.40 cm   LV E/e' medial:  20.1 LV IVS:        1.40 cm   LV e' lateral:   6.74 cm/s LVOT diam:     1.90 cm   LV E/e' lateral: 17.8 LV SV:         38 LV SV Index:   18 LVOT Area:     2.84 cm                           3D Volume EF:                           3D EF:        55 %                          LV EDV:       168 ml                          LV ESV:       76 ml                          LV SV:        92 ml RIGHT VENTRICLE RV Basal  diam:  3.30 cm RV Mid diam:    2.90 cm RV S prime:     12.80 cm/s TAPSE (M-mode): 1.8 cm LEFT ATRIUM             Index        RIGHT ATRIUM           Index LA diam:        4.00 cm 1.88 cm/m   RA Area:     12.90 cm LA Vol (A2C):   84.6 ml 39.84 ml/m  RA Volume:   28.00 ml  13.18 ml/m LA Vol (A4C):   57.5 ml 27.08 ml/m LA Biplane Vol: 69.9 ml 32.92 ml/m  AORTIC VALVE LVOT Vmax:   69.70 cm/s LVOT Vmean:  50.900 cm/s LVOT VTI:    0.134 m  AORTA Ao Root diam: 3.20 cm Ao Asc diam:  3.20 cm MITRAL VALVE                TRICUSPID VALVE MV Area (PHT): 3.17 cm     TR Peak grad:   38.7 mmHg MV Decel Time: 239 msec     TR Vmax:        311.00 cm/s MR Peak grad: 49.3 mmHg MR Mean grad: 35.0 mmHg     SHUNTS MR Vmax:      351.00 cm/s   Systemic VTI:  0.13 m MR Vmean:     283.0 cm/s    Systemic Diam: 1.90 cm MV E velocity: 120.00 cm/s MV A velocity: 25.30 cm/s MV E/A ratio:  4.74 Candee Furbish MD Electronically signed by Candee Furbish MD Signature Date/Time: 02/07/2023/12:12:04 PM    Final    DG Chest 2 View  Result Date: 02/06/2023 CLINICAL DATA:  Chest pain and dyspnea as well as upper abdominal pain. EXAM: CHEST - 2 VIEW COMPARISON:  10/11/2022 FINDINGS: Lungs are adequately inflated with hazy bibasilar opacification with small amount of bilateral pleural fluid. Mild prominence of the central pulmonary vessels. Findings may be due to mild CHF/interstitial edema, although infection in the lung bases is possible. Mild stable cardiomegaly. Remainder of the exam is unchanged. IMPRESSION: Hazy bibasilar opacification with small amount of bilateral pleural fluid. Findings may be due to mild CHF/interstitial edema, although infection in the lung bases is possible. Electronically Signed   By: Marin Olp M.D.   On: 02/06/2023 14:30     Discharge  Instructions:  Discharge Instructions   None      Signed: Marisa Cyphers, Medical Student 02/11/2023, 1:50 PM   Pager: [phone]

## 2023-02-11 NOTE — Discharge Instructions (Signed)
To Alicia [name] or their caretakers,  They were evaluated and treated in the hospital for:  Active Problems:   Type 2 diabetes mellitus with moderate nonproliferative diabetic retinopathy ([place])   Essential hypertension   Sinus bradycardia   Normocytic anemia  Principal Problem (Resolved):   Acute exacerbation of CHF (congestive heart failure) ([place]) Resolved Problems:   UTI (urinary tract infection)   Acute on chronic hypoxic respiratory failure (HCC)   Iron deficiency  The evaluation suggested acute on chronic heart failure with recovered ejection fraction. They were treated with high-dose IV furosemide, metolazone, and acetazolamide.  These are medicines that help the kidneys get rid of excess fluid.  They were discharged from the hospital on 02/11/23. I recommend the following after leaving the hospital:   Start taking the following medicine: - Torsemide 60 mg daily - Spiriva 1 puff daily  For now, stop taking: - Farxiga - Lasix  You have a follow-up appointment with Dr. Johnney Ou on February 16, 2023 at 10:45 AM.  Be mindful of the amount of fluid you taken throughout the day.  This includes water, soda, milk, alcohol, coffee, tea, soup-even some fruits, like watermelon, have a lot of fluid in them that can contribute to your daily total.  Drink if you are thirsty, but try to avoid beverages for the sake of enjoyment.  Begin weighing yourself daily.  Call your doctor if you gain 3 pounds in a day or 5 pounds in a week.  Nani Gasser MD 02/11/2023, 1:45 PM

## 2023-02-11 NOTE — Progress Notes (Addendum)
   Heart Failure Stewardship Pharmacist Progress Note   PCP: Axel Filler, MD PCP-Cardiologist: Candee Furbish, MD    HPI:  67 yo F with PMH of COPD, oxygen dependent, CHF (EF 20-25% in 2015, recovered to 55-60%), CAD, HLD, GERD, T2DM, and CVA.   Admitted in Nov 2023 with acute on chronic CHF exacerbation. Diuresed w/ IV Lasix and treated w/ abx, steroids and nebs. Echo showed normal EF 55-60% and normal RV. After diureses, was transitioned to PO Lasix. Also placed on Entresto.   She was seen in HF TOC 10/2022. Jardiacne increased to 10 mg daily and spironlactone added. She was seen back in [facility] 11/2022 and spironolactone was increased to 25 mg daily. She was later seen at [facility] on 1/30 and lasix was decreased to 80 mg AM and 40 mg PM.  Presented back to the ED on 3/16 with increased shortness of breath, orthopnea, LE edema, chills, and nausea/vomiting. Reports having a recent GI bug and was drinking more fluids. CXR with signs of mild CHF vs PNA. ECHO 3/17 showed LVEF 60-65%, no regional wall motion abnormalities, G2DD, and RV normal. Is completing treatment for UTI today.  Current HF Medications: Diuretic: furosemide 120 mg IV BID + diamox 500 mg daily  ACE/ARB/ARNI: Entresto 97/103 mg BID MRA: spironolactone 25 mg daily Other: IV iron sucrose given 3/20  Prior to admission HF Medications: Diuretic: furosemide 80 mg BID ACE/ARB/ARNI: Entresto 97/103 mg BID MRA: spironolactone 25 mg daily SGLT2i: Farxiga 10 mg daily  Pertinent Lab Values: Serum creatinine 1.42, BUN 52, Potassium 4.3, Sodium 129, BNP 1437.4, Magnesium 2.2, A1c 8.7   Vital Signs: Weight: 213 lbs (admission weight: 220 lbs) Blood pressure: 150/70s  Heart rate: 50s  I/O: -1.4L yesterday; net -4.4L  Medication Assistance / Insurance Benefits Check: Does the patient have prescription insurance?  Yes Type of insurance plan: [place] Medicaid  Outpatient Pharmacy:  Prior to admission [facility] Is the  patient willing to use [place] at discharge? Yes Is the patient willing to transition their outpatient pharmacy to utilize a [facility] outpatient pharmacy?   Pending    Assessment: 1. 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 NYHA class III symptoms. - Diuresis improved with addition of metolazone. Still remains on 4L O2. Continue lasix 120 mg IV BID and diamox 500 mg daily and consider repeating metolazone today. Strict I/Os and daily weights. Keep K>4 and Mg>2.  - No BB with bradycardia and COPD - Continue Entresto 97/103 mg BID - Continue spironolactone 25 mg daily - Holding [place] while being treated for UTI, consider resuming once therapy complete. Currently on day 4 of 4.  - IV iron given 3/20 - no recent iron labs. Recommend checking in about 2 months.    Plan: 1) Medication changes recommended at this time: - Continue IV diuresis with metolazone to augment diuresis  2) Patient assistance: - None pending, has [place] Medicaid   3)  Education  - Patient has been educated on current HF medications and potential additions to HF medication regimen - Patient verbalizes understanding that over the next few months, these medication doses may change and more medications may be added to optimize HF regimen - Patient has been educated on basic disease state pathophysiology and goals of therapy   Kerby Nora, PharmD, BCPS Heart Failure Stewardship Pharmacist Phone [phone]

## 2023-02-11 NOTE — TOC Transition Note (Signed)
Transition of Care [facility]) - CM/SW Discharge Note   Patient Details  Name: Alicia [name] MRN: [id number] Date of Birth: [birth year]  Transition of Care [facility]) CM/SW Contact:  Zenon Mayo, RN Phone Number: 02/11/2023, 2:12 PM   Clinical Narrative:    Patient is for dc today, daughter will transport her home later this evening.  [employer] will deliver the walker to the room and bsc to the home.    Final next level of care: Home/Self Care Barriers to Discharge: No Barriers Identified   Patient Goals and CMS Choice CMS Medicare.gov Compare Post Acute Care list provided to:: Patient Choice offered to / list presented to : Patient  Discharge Placement                         Discharge Plan and Services Additional resources added to the After Visit Summary for                  DME Arranged: Walker rolling, Bedside commode DME Agency: [facility] [employer] Date DME Agency Contacted: 02/11/23 Time DME Agency Contacted: [phone] Representative spoke with at DME Agency: Ulice Dash HH Arranged: Patient Refused [place]          Social Determinants of Health ([place]) Interventions SDOH Screenings   Food Insecurity: No Food Insecurity (02/07/2023)  Housing: Low Risk  (02/06/2023)  Transportation Needs: No Transportation Needs (02/07/2023)  Utilities: Not At Risk (02/07/2023)  Alcohol Screen: Low Risk  (02/08/2023)  Depression (PHQ2-9): Low Risk  (02/01/2023)  Financial Resource Strain: Low Risk  (02/08/2023)  Physical Activity: Insufficiently Active (09/28/2022)  Social Connections: Socially Isolated (09/28/2022)  Stress: No Stress Concern Present (09/28/2022)  Tobacco Use: Medium Risk (02/09/2023)     Readmission Risk Interventions     No data to display

## 2023-02-11 NOTE — Progress Notes (Signed)
AVS given and reviewed with pt. TOC meds delivered to bedside by pharmacy. Medications discussed. Rolling walker delivered to bedside. All questions answered to satisfaction. Pt verbalized understanding of information given. Pt to be escorted off the unit with all belongings via wheelchair by staff member.

## 2023-02-12 ENCOUNTER — Telehealth: Payer: Self-pay

## 2023-02-12 NOTE — Transitions of Care (Post Inpatient/ED Visit) (Signed)
   02/12/2023  Name: Alicia [name] MRN: [id number] DOB: 07/27/1956  Today's TOC FU Call Status: Today's TOC FU Call Status:: Successful TOC FU Call Competed TOC FU Call Complete Date: 02/12/23  Transition Care Management Follow-up Telephone Call Date of Discharge: 02/11/23 Discharge Facility[facility]) Type of Discharge: Inpatient Admission Primary Inpatient Discharge Diagnosis:: Acute Decompensated Heart Failure How have you been since you were released from the hospital?: Better Any questions or concerns?: No  Items Reviewed: Did you receive and understand the discharge instructions provided?: Yes Medications obtained and verified?: Yes (Medications Reviewed) Any new allergies since your discharge?: No Dietary orders reviewed?: Yes Type of Diet Ordered:: Low sodium Do you have support at home?: Yes People in Home: child(ren), adult Name of Support/Comfort Primary Source: [place] and Equipment/Supplies: [place] Ordered?: No (Patient refused home health) Any new equipment or medical supplies ordered?: Yes Name of Medical supply agency?: [employer] Were you able to get the equipment/medical supplies?: Yes Do you have any questions related to the use of the equipment/supplies?: No  Functional Questionnaire: Do you need assistance with bathing/showering or dressing?: Yes Do you need assistance with meal preparation?: No Do you need assistance with eating?: No Do you have difficulty maintaining continence: No Do you need assistance with getting out of bed/getting out of a chair/moving?: Yes Do you have difficulty managing or taking your medications?: No  Follow up appointments reviewed: PCP Follow-up appointment confirmed?: Yes Date of PCP follow-up appointment?: 02/16/23 Follow-up Provider: Dr. Agustin Cree Specialist [facility] Follow-up appointment confirmed?: Yes Date of Specialist follow-up appointment?: 02/24/23 Follow-Up Specialty  Provider:: Heart Failure Clinic Do you need transportation to your follow-up appointment?: No Do you understand care options if your condition(s) worsen?: Yes-patient verbalized understanding  SDOH Interventions Today    Flowsheet Row Most Recent Value  SDOH Interventions   Food Insecurity Interventions Intervention Not Indicated      Interventions Today    Flowsheet Row Most Recent Value  Chronic Disease   Chronic disease during today's visit Congestive Heart Failure (CHF)  General Interventions   General Interventions Discussed/Reviewed General Interventions Discussed, Referral to Nurse       Johnney Killian, RN, BSN, CCM Care Management Coordinator [facility] Phone: [phone]: [phone]

## 2023-02-16 ENCOUNTER — Other Ambulatory Visit: Payer: Self-pay

## 2023-02-16 ENCOUNTER — Encounter: Payer: Self-pay | Admitting: Student

## 2023-02-16 ENCOUNTER — Ambulatory Visit (INDEPENDENT_AMBULATORY_CARE_PROVIDER_SITE_OTHER): Payer: 59 | Admitting: Student

## 2023-02-16 VITALS — BP 124/55 | HR 59 | Temp 97.6°F | Ht 67.0 in | Wt 212.5 lb

## 2023-02-16 DIAGNOSIS — Z9981 Dependence on supplemental oxygen: Secondary | ICD-10-CM

## 2023-02-16 DIAGNOSIS — N179 Acute kidney failure, unspecified: Secondary | ICD-10-CM | POA: Diagnosis not present

## 2023-02-16 DIAGNOSIS — I5022 Chronic systolic (congestive) heart failure: Secondary | ICD-10-CM

## 2023-02-16 DIAGNOSIS — J439 Emphysema, unspecified: Secondary | ICD-10-CM | POA: Diagnosis not present

## 2023-02-16 DIAGNOSIS — J9611 Chronic respiratory failure with hypoxia: Secondary | ICD-10-CM | POA: Diagnosis not present

## 2023-02-16 NOTE — Assessment & Plan Note (Signed)
Assessment: During hospitalization Spiriva added to regimen, continued on dulera and albuterol PRN. No wheezing on examination today. Showed her how to use her Spiriva inhaler, she used this with me in the room and all questions answered.   Plan: - continue dulera, spiriva, and albuterol PRN

## 2023-02-16 NOTE — Patient Instructions (Addendum)
Thank you, Ms.Haiden Toppin Rawl for allowing [name] to provide your care today. Today we discussed .    Heart failure You are doing great! Please check your medicines at home and make sure they are the same as what we have listed here.   If you notice a 3-5 lb weight gain, please call our clinic.   Diabetes Continue to take your insulin and Trulicity. Make sure you are not skipping meals.   Continue your Spiriva and dulera inhalers   Please make sure you are taking your ezetimab and atorvastatin  I have ordered the following labs for you:   Lab Orders         BMP8+Anion Gap       Referrals ordered today:   Referral Orders  No referral(s) requested today     I have ordered the following medication/changed the following medications:   Stop the following medications: There are no discontinued medications.   Start the following medications: No orders of the defined types were placed in this encounter.    Follow up: 2 month follow up with Dr. Evette Doffing to check your A1c    Should you have any questions or concerns please call the internal medicine clinic at [phone].    Sanjuana Letters, D.O. [place]

## 2023-02-16 NOTE — Assessment & Plan Note (Signed)
Assessment: Hx of heart failure with improved ejection fraction, thought to be secondary to ischemic cardiomyopathy. Hospitalization for heart failure exacerbation thought to be seoncdayr to increased fluid intake. Since discharge she has felt well, had some weakness but this is improving. GDMT of entresto 200 BID, spironolactone 25 mg daily. Not on long term beta blocker likely with history of COPD. SGLT2-I discontinued on discharge due to recurrent UTI's. Her diuretic was changed from lasix to torsemide 60 mg daily  She is breathing comfortably on home 4L [place]. No JVD, S4, or lower extremity edema. She appears euvolemic today on examination. Home weight this morning of 210 lbs.   We discussed continuing medications above and if she notices increase leg swelling or weight gain of 3-5 lbs she is to call the clinic.   Plan: - continue GDMT of entresto 200 mg BID, spironolactone 25 mg daily. - continue torsemide 60 mg daily - follow up as needed, dry weight appears around 210 lbs her scale

## 2023-02-16 NOTE — Progress Notes (Signed)
CC: hospital follow up  HPI:  [name] is a 67 y.o. female living with a history stated below and presents today for hospital follow up. Please see problem based assessment and plan for additional details.  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  Non-functioning   Aortic atherosclerosis ([place]) 09/24/2017   Asymptomatic, found on CT scan   Blood transfusion without reported diagnosis    Chronic Systolic Heart Failure 123456   Felt to be non-ischemic and secondary to hypertension.  Echo (05/28/2014): LVEF 25%.  Is not interested in AICD placement.   COPD exacerbation ([place])    Coronary artery disease involving native coronary artery of native heart without angina pectoris 11/09/2014   Cardiac cath (02/18/2014): Non-obstructive, mRCA 30%, dRCA 60%   Cystocele with uterine prolapse - grade 3 02/12/2016   Not interested in pessary after trying   Diverticulosis of colon 09/24/2017   Diverticulosis of colon 09/24/2017   Essential hypertension 10/15/2013   Gastroesophageal reflux disease 09/24/2017   History of cerebrovascular accident 05/10/2013   Per patient report 6 previous strokes, most recent on 05/10/13.  No residual deficits.   History of cerebrovascular accident 05/10/2013   Per patient report 6 previous strokes, most recent on 05/10/13.  No residual deficits.   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 ([place]) 10/15/2013    Current Outpatient  Medications on File Prior to Visit  Medication Sig Dispense Refill   ACCU-CHEK AVIVA PLUS test strip USE TO TEST BLOOD SUGARS AS DIRECTED 100 strip 12   albuterol (VENTOLIN HFA) 108 (90 Base) MCG/ACT inhaler TAKE 2 PUFFS BY MOUTH EVERY 6 HOURS AS NEEDED 18 each 5   amLODipine (NORVASC) 10 MG tablet TAKE 1 TABLET BY MOUTH EVERY DAY 90 tablet 3   aspirin 81 MG tablet Take 81 mg by mouth daily.     atorvastatin (LIPITOR) 80 MG tablet Take 1 tablet (80 mg total) by mouth daily. 90 tablet 3   B-D UF III MINI PEN NEEDLES 31G X 5 MM MISC USE 3 TIMES A DAY WITH HUMALOG 100 each 5   Dulaglutide (TRULICITY) A999333 0000000 SOPN Inject 0.75 mg into the skin once a week. 6 mL 1   ezetimibe (ZETIA) 10 MG tablet Take 1 tablet (10 mg total) by mouth daily. 90 tablet 3   insulin glargine (LANTUS) 100 UNIT/ML injection Inject 0.2 mLs (20 Units total) into the skin at bedtime. 10 mL 3   insulin lispro (HUMALOG) 100 UNIT/ML KwikPen Inject 14 units before breakfast and lunch, inject 20 units before dinner 15 mL 11   Insulin Pen Needle 32G X 4 MM MISC Use to inject insulin 4 times a day. The patient is insulin requiring, ICD 10 code 11.10. The patient injects 4 times per day. 400 each 3   Insulin Syringe-Needle U-100 31G X 15/64" 0.3 ML MISC Use to inject Humalog before meals three times a day 100 each 5   Lancets (  ACCU-CHEK MULTICLIX) lancets Use to check your blood sugar four times daily: early morning, before a meal, two hours after a meal, and bedtime 100 each 12   mometasone-formoterol (DULERA) 100-5 MCG/ACT AERO Inhale 2 puffs into the lungs 2 (two) times daily. 13 g 3   nystatin powder Apply 1 Application topically 3 (three) times daily. 15 g 2   sacubitril-valsartan (ENTRESTO) 97-103 MG Take 1 tablet by mouth 2 (two) times daily. 180 tablet 3   senna (SENOKOT) 8.6 MG TABS tablet Take 2 tablets (17.2 mg total) by mouth daily. 120 tablet 2   spironolactone (ALDACTONE) 25 MG tablet Take 1 tablet (25 mg total) by  mouth daily. 90 tablet 3   tiotropium (SPIRIVA HANDIHALER) 18 MCG inhalation capsule Place 1 capsule (18 mcg total) into inhaler and inhale daily. 30 capsule 2   torsemide (DEMADEX) 20 MG tablet Take 3 tablets (60 mg total) by mouth daily. 90 tablet 0   [DISCONTINUED] insulin aspart (NOVOLOG) 100 UNIT/ML injection Inject 4.5 Units into the skin at bedtime. Prescribed 3 times daily, but takes only once because she is afraid of running out     No current facility-administered medications on file prior to visit.    Review of Systems: ROS negative except for what is noted on the assessment and plan.  Vitals:   02/16/23 1027  BP: (!) 124/55  Pulse: (!) 59  Temp: 97.6 F (36.4 C)  TempSrc: Oral  SpO2: 98%  Weight: 212 lb 8 oz (96.4 kg)  Height: 5\' 7"  (1.702 m)    Physical Exam: Constitutional: well-appearing, in no acute distress HENT: normocephalic atraumatic, mucous membranes moist. Nasal cannula in place Eyes: conjunctiva non-erythematous Neck: supple Cardiovascular: regular rate and rhythm, no m/r/g. No JVD. No lower extremity edema.  Pulmonary/Chest: normal work of breathing on baseline 4L, lungs clear to auscultation bilaterally MSK: normal bulk and tone Neurological: alert & oriented x 3 Skin: warm and dry Psych: pleasant  Assessment & Plan:   Chronic respiratory failure with hypoxia, on home oxygen therapy (HCC) Assessment: Recent admission for heart failure exacerbation. She is saturating well on her home 4L [place]. Denies any dyspnea at rest.   Plan: - continue supplemental oxygen   COPD (chronic obstructive pulmonary disease) ([place]) Assessment: During hospitalization Spiriva added to regimen, continued on dulera and albuterol PRN. No wheezing on examination today. Showed her how to use her Spiriva inhaler, she used this with me in the room and all questions answered.   Plan: - continue dulera, spiriva, and albuterol PRN  Chronic Systolic Heart  Failure Assessment: Hx of heart failure with improved ejection fraction, thought to be secondary to ischemic cardiomyopathy. Hospitalization for heart failure exacerbation thought to be seoncdayr to increased fluid intake. Since discharge she has felt well, had some weakness but this is improving. GDMT of entresto 200 BID, spironolactone 25 mg daily. Not on long term beta blocker likely with history of COPD. SGLT2-I discontinued on discharge due to recurrent UTI's. Her diuretic was changed from lasix to torsemide 60 mg daily  She is breathing comfortably on home 4L [place]. No JVD, S4, or lower extremity edema. She appears euvolemic today on examination. Home weight this morning of 210 lbs.   We discussed continuing medications above and if she notices increase leg swelling or weight gain of 3-5 lbs she is to call the clinic.   Plan: - continue GDMT of entresto 200 mg BID, spironolactone 25 mg daily. - continue torsemide 60 mg daily - follow  up as needed, dry weight appears around 210 lbs her scale  Acute kidney injury ([place]) Assessment: Baseline Cr of under 1.0. On admission had creatinine range from 1.2-1.4. Differential includes progression to diabetic kidney disease (history of severe albuminuria), cardiorenal syndrome, and congestion from her heart failure exacerbation. She has been adherent to entresto and was on farxiga prior to this hospitalization. Will need to repeat BMP in three mnths to montior renal function. Can consider renal ultrasound and protein to albumin ratio if renal dysfunction persists.   Plan: - BMP today to assure some improvement - repeat BMP in 2 months  - follow up proteinuria, consider renal US and protein/albumin ratio  Patient discussed with Dr. Fanny Bien, D.O. [facility], PGY-3 Phone: [phone] Date 02/16/2023 Time 3:29 PM

## 2023-02-16 NOTE — Assessment & Plan Note (Addendum)
Assessment: Baseline Cr of under 1.0. On admission had creatinine range from 1.2-1.4. Differential includes progression to diabetic kidney disease (history of severe albuminuria), cardiorenal syndrome, and congestion from her heart failure exacerbation. She has been adherent to entresto and was on farxiga prior to this hospitalization. Will need to repeat BMP in three mnths to montior renal function. Can consider renal ultrasound and protein to albumin ratio if renal dysfunction persists.   Plan: - BMP today to assure some improvement - repeat BMP in 2 months  - follow up proteinuria, consider renal US and protein/albumin ratio  Addendum: Renal function has normalized. Continue treatment

## 2023-02-16 NOTE — Assessment & Plan Note (Signed)
Assessment: Recent admission for heart failure exacerbation. She is saturating well on her home 4L [place]. Denies any dyspnea at rest.   Plan: - continue supplemental oxygen

## 2023-02-17 DIAGNOSIS — J9611 Chronic respiratory failure with hypoxia: Secondary | ICD-10-CM | POA: Diagnosis not present

## 2023-02-17 LAB — BMP8+ANION GAP
Anion Gap: 16 mmol/L (ref 10.0–18.0)
BUN/Creatinine Ratio: 41 — ABNORMAL HIGH (ref 12–28)
BUN: 40 mg/dL — ABNORMAL HIGH (ref 8–27)
CO2: 27 mmol/L (ref 20–29)
Calcium: 9.6 mg/dL (ref 8.7–10.3)
Chloride: 91 mmol/L — ABNORMAL LOW (ref 96–106)
Creatinine, Ser: 0.97 mg/dL (ref 0.57–1.00)
Glucose: 148 mg/dL — ABNORMAL HIGH (ref 70–99)
Potassium: 4.9 mmol/L (ref 3.5–5.2)
Sodium: 134 mmol/L (ref 134–144)
eGFR: 65 mL/min/{1.73_m2} (ref >59)

## 2023-02-24 ENCOUNTER — Encounter (INDEPENDENT_AMBULATORY_CARE_PROVIDER_SITE_OTHER): Payer: 59 | Admitting: Ophthalmology

## 2023-02-24 ENCOUNTER — Ambulatory Visit (HOSPITAL_COMMUNITY): Payer: 59

## 2023-02-24 NOTE — Progress Notes (Signed)
Internal Medicine Clinic Attending  Case discussed with Dr. Katsadouros at the time of the visit.  We reviewed the resident's history and exam and pertinent patient test results.  I agree with the assessment, diagnosis, and plan of care documented in the resident's note.  

## 2023-02-25 ENCOUNTER — Encounter (INDEPENDENT_AMBULATORY_CARE_PROVIDER_SITE_OTHER): Payer: 59 | Admitting: Ophthalmology

## 2023-02-25 DIAGNOSIS — H43813 Vitreous degeneration, bilateral: Secondary | ICD-10-CM | POA: Diagnosis not present

## 2023-02-25 DIAGNOSIS — H34832 Tributary (branch) retinal vein occlusion, left eye, with macular edema: Secondary | ICD-10-CM | POA: Diagnosis not present

## 2023-02-25 DIAGNOSIS — H4421 Degenerative myopia, right eye: Secondary | ICD-10-CM | POA: Diagnosis not present

## 2023-02-25 DIAGNOSIS — E113312 Type 2 diabetes mellitus with moderate nonproliferative diabetic retinopathy with macular edema, left eye: Secondary | ICD-10-CM | POA: Diagnosis not present

## 2023-02-25 DIAGNOSIS — I10 Essential (primary) hypertension: Secondary | ICD-10-CM | POA: Diagnosis not present

## 2023-02-25 DIAGNOSIS — H35033 Hypertensive retinopathy, bilateral: Secondary | ICD-10-CM | POA: Diagnosis not present

## 2023-02-26 ENCOUNTER — Ambulatory Visit: Payer: Self-pay

## 2023-02-26 NOTE — Patient Outreach (Signed)
  Care Coordination   Initial Visit Note   02/26/2023 Name: Alicia [name] MRN: [id number] DOB: 1955-12-06  Alicia [name] is a 67 y.o. year old female who sees Oswaldo Done, Marquita Palms, MD for primary care. I spoke with  Niomie Fadley Guidroz by phone today.  What matters to the patients health and wellness today?  Mrs. Alicia [name] was hospitalized due to worsening of her Congestive Heart Failure (CHF) symptoms. However, she has been discharged and is now doing well. She is following her medication regimen and also taking a diuretic to flush out excess fluids from her body. We discussed the HF action plan with her, and I will be sending her educational material about this plan along with relevant information.    Goals Addressed             This Visit's Progress    I wwant to learn about my CHF and keep it under control       Patient Goals/Self Care Activities: -Patient/Caregiver will self-administer medications as prescribed as evidenced by self-report/primary caregiver report  -Patient/Caregiver will attend all scheduled provider appointments as evidenced by clinician review of documented attendance to scheduled appointments and patient/caregiver report -Patient/Caregiver will call pharmacy for medication refills as evidenced by patient report and review of pharmacy fill history as appropriate -Patient/Caregiver will call provider office for new concerns or questions as evidenced by review of documented incoming telephone call notes and patient report -Patient/Caregiver verbalizes understanding of plan -Patient/Caregiver will focus on medication adherence by taking medications as prescribed  -Weigh daily and record (notify MD with 3 lb weight gain over night or 5 lb in a week) -Follow CHF Action Plan -Adhere to low sodium diet          SDOH assessments and interventions completed:  Yes  SDOH Interventions Today    Flowsheet Row Most Recent Value  SDOH Interventions   Food Insecurity  Interventions Intervention Not Indicated  Transportation Interventions Intervention Not Indicated        Care Coordination Interventions:  Yes, provided   Interventions Today    Flowsheet Row Most Recent Value  Chronic Disease   Chronic disease during today's visit Congestive Heart Failure (CHF)  General Interventions   General Interventions Discussed/Reviewed General Interventions Discussed, General Interventions Reviewed  Education Interventions   Education Provided Provided Education        Follow up plan: Follow up call scheduled for 4 19 24     Encounter Outcome:  Pt. Visit Completed    [name] RN, BSN, [facility] Care Coordinator [facility]   Phone: [phone]

## 2023-02-26 NOTE — Patient Instructions (Signed)
Visit Information  Thank you for taking time to visit with me today. Please don't hesitate to contact me if I can be of assistance to you.   Following are the goals we discussed today:   Goals Addressed             This Visit's Progress    I wwant to learn about my CHF and keep it under control       Patient Goals/Self Care Activities: -Patient/Caregiver will self-administer medications as prescribed as evidenced by self-report/primary caregiver report  -Patient/Caregiver will attend all scheduled provider appointments as evidenced by clinician review of documented attendance to scheduled appointments and patient/caregiver report -Patient/Caregiver will call pharmacy for medication refills as evidenced by patient report and review of pharmacy fill history as appropriate -Patient/Caregiver will call provider office for new concerns or questions as evidenced by review of documented incoming telephone call notes and patient report -Patient/Caregiver verbalizes understanding of plan -Patient/Caregiver will focus on medication adherence by taking medications as prescribed  -Weigh daily and record (notify MD with 3 lb weight gain over night or 5 lb in a week) -Follow CHF Action Plan -Adhere to low sodium diet          Our next appointment is by telephone on 03/12/23 at 1130 am  Please call the care guide team at [phone] if you need to cancel or reschedule your appointment.   If you are experiencing a Mental Health or Behavioral Health Crisis or need someone to talk to, please call 1-800-273-TALK (toll free, 24 hour hotline)  Patient verbalizes understanding of instructions and care plan provided today and agrees to view in MyChart. Active MyChart status and patient understanding of how to access instructions and care plan via MyChart confirmed with patient.     Alicia Fairly RN, BSN, [facility] Care Coordinator [facility]   Phone: [phone]

## 2023-03-10 ENCOUNTER — Ambulatory Visit (HOSPITAL_COMMUNITY): Payer: 59

## 2023-03-12 ENCOUNTER — Ambulatory Visit: Payer: Self-pay

## 2023-03-12 NOTE — Patient Outreach (Unsigned)
  Care Coordination   Follow Up Visit Note   03/13/2023 Name: Alicia [name] MRN: [id number] DOB: 05-Oct-1956  Alicia [name] is a 67 y.o. year old female who sees Oswaldo Done, Marquita Palms, MD for primary care. I spoke with  Alicia [name] by phone today.  What matters to the patients health and wellness today?  Alicia [name] is doing well and has found the information and calendar helpful. She is learning a lot and is keeping track of her weight daily. Today, her weight was 119 lbs, and she is taking her medications regularly. She has not reported any chest pain or swelling.    Goals Addressed             This Visit's Progress    I want to learn about my CHF and keep it under control       Patient Goals/Self Care Activities: -Patient/Caregiver will self-administer medications as prescribed as evidenced by self-report/primary caregiver report  -Patient/Caregiver will attend all scheduled provider appointments as evidenced by clinician review of documented attendance to scheduled appointments and patient/caregiver report -Patient/Caregiver will call pharmacy for medication refills as evidenced by patient report and review of pharmacy fill history as appropriate -Patient/Caregiver will call provider office for new concerns or questions as evidenced by review of documented incoming telephone call notes and patient report -Patient/Caregiver verbalizes understanding of plan -Patient/Caregiver will focus on medication adherence by taking medications as prescribed  -Weigh daily and record (notify MD with 3 lb weight gain over night or 5 lb in a week) -Follow CHF Action Plan -Adhere to low sodium diet  -continue to earn information about your health issues         SDOH assessments and interventions completed:  No     Care Coordination Interventions:  Yes, provided   Interventions Today    Flowsheet Row Most Recent Value  Chronic Disease   Chronic disease during today's visit  Congestive Heart Failure (CHF)  General Interventions   General Interventions Discussed/Reviewed General Interventions Reviewed  Education Interventions   Education Provided Provided Education  Provided Verbal Education On Nutrition  Safety Interventions   Safety Discussed/Reviewed Safety Discussed        Follow up plan: Follow up call scheduled for 04/13/23  130 pm    Encounter Outcome:  Pt. Visit Completed   [name] RN, BSN, [facility] Care Coordinator [facility]   Phone: [phone]

## 2023-03-13 NOTE — Patient Instructions (Signed)
Visit Information  Thank you for taking time to visit with me today. Please don't hesitate to contact me if I can be of assistance to you.   Following are the goals we discussed today:   Goals Addressed             This Visit's Progress    I want to learn about my CHF and keep it under control       Patient Goals/Self Care Activities: -Patient/Caregiver will self-administer medications as prescribed as evidenced by self-report/primary caregiver report  -Patient/Caregiver will attend all scheduled provider appointments as evidenced by clinician review of documented attendance to scheduled appointments and patient/caregiver report -Patient/Caregiver will call pharmacy for medication refills as evidenced by patient report and review of pharmacy fill history as appropriate -Patient/Caregiver will call provider office for new concerns or questions as evidenced by review of documented incoming telephone call notes and patient report -Patient/Caregiver verbalizes understanding of plan -Patient/Caregiver will focus on medication adherence by taking medications as prescribed  -Weigh daily and record (notify MD with 3 lb weight gain over night or 5 lb in a week) -Follow CHF Action Plan -Adhere to low sodium diet  -continue to earn information about your health issues         Our next appointment is by telephone on 04/13/23 at 130 pm  Please call the care guide team at [phone] if you need to cancel or reschedule your appointment.   If you are experiencing a Mental Health or Behavioral Health Crisis or need someone to talk to, please call 1-800-273-TALK (toll free, 24 hour hotline)  Patient verbalizes understanding of instructions and care plan provided today and agrees to view in MyChart. Active MyChart status and patient understanding of how to access instructions and care plan via MyChart confirmed with patient.     [name] RN, BSN, [facility] Care Coordinator [facility]    Phone: [phone]

## 2023-03-16 ENCOUNTER — Encounter: Payer: Self-pay | Admitting: Student

## 2023-03-16 ENCOUNTER — Other Ambulatory Visit: Payer: Self-pay

## 2023-03-16 ENCOUNTER — Emergency Department (HOSPITAL_COMMUNITY): Payer: 59

## 2023-03-16 ENCOUNTER — Ambulatory Visit (INDEPENDENT_AMBULATORY_CARE_PROVIDER_SITE_OTHER): Payer: 59 | Admitting: Student

## 2023-03-16 ENCOUNTER — Ambulatory Visit (HOSPITAL_COMMUNITY)
Admission: RE | Admit: 2023-03-16 | Discharge: 2023-03-16 | Disposition: A | Discharge status: Home / Self Care | Payer: 59 | Source: Ambulatory Visit | Attending: Internal Medicine | Admitting: Internal Medicine

## 2023-03-16 ENCOUNTER — Emergency Department (HOSPITAL_COMMUNITY)
Admission: EM | Admit: 2023-03-16 | Discharge: 2023-03-16 | Disposition: A | Discharge status: Home / Self Care | Payer: 59 | Attending: Emergency Medicine | Admitting: Emergency Medicine

## 2023-03-16 ENCOUNTER — Telehealth: Payer: Self-pay | Admitting: *Deleted

## 2023-03-16 VITALS — BP 164/77 | HR 64 | Temp 98.2°F | Ht 67.0 in | Wt 217.7 lb

## 2023-03-16 DIAGNOSIS — J449 Chronic obstructive pulmonary disease, unspecified: Secondary | ICD-10-CM | POA: Diagnosis not present

## 2023-03-16 DIAGNOSIS — Z794 Long term (current) use of insulin: Secondary | ICD-10-CM | POA: Insufficient documentation

## 2023-03-16 DIAGNOSIS — I517 Cardiomegaly: Secondary | ICD-10-CM | POA: Insufficient documentation

## 2023-03-16 DIAGNOSIS — J90 Pleural effusion, not elsewhere classified: Secondary | ICD-10-CM | POA: Insufficient documentation

## 2023-03-16 DIAGNOSIS — R0789 Other chest pain: Secondary | ICD-10-CM

## 2023-03-16 DIAGNOSIS — I509 Heart failure, unspecified: Secondary | ICD-10-CM | POA: Diagnosis not present

## 2023-03-16 DIAGNOSIS — Z9981 Dependence on supplemental oxygen: Secondary | ICD-10-CM | POA: Insufficient documentation

## 2023-03-16 DIAGNOSIS — R0609 Other forms of dyspnea: Secondary | ICD-10-CM

## 2023-03-16 DIAGNOSIS — Z7982 Long term (current) use of aspirin: Secondary | ICD-10-CM | POA: Insufficient documentation

## 2023-03-16 DIAGNOSIS — Z8673 Personal history of transient ischemic attack (TIA), and cerebral infarction without residual deficits: Secondary | ICD-10-CM | POA: Diagnosis not present

## 2023-03-16 DIAGNOSIS — Z79899 Other long term (current) drug therapy: Secondary | ICD-10-CM | POA: Insufficient documentation

## 2023-03-16 DIAGNOSIS — I2510 Atherosclerotic heart disease of native coronary artery without angina pectoris: Secondary | ICD-10-CM | POA: Diagnosis not present

## 2023-03-16 DIAGNOSIS — J811 Chronic pulmonary edema: Secondary | ICD-10-CM | POA: Diagnosis not present

## 2023-03-16 DIAGNOSIS — R0602 Shortness of breath: Secondary | ICD-10-CM | POA: Diagnosis not present

## 2023-03-16 DIAGNOSIS — I110 Hypertensive heart disease with heart failure: Secondary | ICD-10-CM | POA: Diagnosis not present

## 2023-03-16 DIAGNOSIS — I5022 Chronic systolic (congestive) heart failure: Secondary | ICD-10-CM | POA: Diagnosis not present

## 2023-03-16 DIAGNOSIS — E119 Type 2 diabetes mellitus without complications: Secondary | ICD-10-CM | POA: Diagnosis not present

## 2023-03-16 DIAGNOSIS — R531 Weakness: Secondary | ICD-10-CM | POA: Diagnosis not present

## 2023-03-16 DIAGNOSIS — J9811 Atelectasis: Secondary | ICD-10-CM | POA: Diagnosis not present

## 2023-03-16 LAB — BRAIN NATRIURETIC PEPTIDE
B Natriuretic Peptide: 418.2 pg/mL — ABNORMAL HIGH (ref 0.0–100.0)
B Natriuretic Peptide: 317.6 pg/mL — ABNORMAL HIGH (ref 0.0–100.0)

## 2023-03-16 LAB — BASIC METABOLIC PANEL
Anion gap: 13 (ref 5–15)
BUN: 13 mg/dL (ref 8–23)
CO2: 32 mmol/L (ref 22–32)
Calcium: 8.5 mg/dL — ABNORMAL LOW (ref 8.9–10.3)
Chloride: 90 mmol/L — ABNORMAL LOW (ref 98–111)
Creatinine, Ser: 0.88 mg/dL (ref 0.44–1.00)
GFR, Estimated: >60 mL/min (ref >60)
Glucose, Bld: 323 mg/dL — ABNORMAL HIGH (ref 70–99)
Potassium: 3.7 mmol/L (ref 3.5–5.1)
Sodium: 135 mmol/L (ref 135–145)

## 2023-03-16 LAB — CBC WITH DIFFERENTIAL/PLATELET
Abs Immature Granulocytes: 0.03 10*3/uL (ref 0.00–0.07)
Basophils Absolute: 0 10*3/uL (ref 0.0–0.1)
Basophils Relative: 1 %
Eosinophils Absolute: 0.2 10*3/uL (ref 0.0–0.5)
Eosinophils Relative: 2 %
HCT: 31.9 % — ABNORMAL LOW (ref 36.0–46.0)
Hemoglobin: 9.8 g/dL — ABNORMAL LOW (ref 12.0–15.0)
Immature Granulocytes: 0 %
Lymphocytes Relative: 17 %
Lymphs Abs: 1.2 10*3/uL (ref 0.7–4.0)
MCH: 26.3 pg (ref 26.0–34.0)
MCHC: 30.7 g/dL (ref 30.0–36.0)
MCV: 85.5 fL (ref 80.0–100.0)
Monocytes Absolute: 0.4 10*3/uL (ref 0.1–1.0)
Monocytes Relative: 5 %
Neutro Abs: 5.2 10*3/uL (ref 1.7–7.7)
Neutrophils Relative %: 75 %
Platelets: 286 10*3/uL (ref 150–400)
RBC: 3.73 MIL/uL — ABNORMAL LOW (ref 3.87–5.11)
RDW: 16.3 % — ABNORMAL HIGH (ref 11.5–15.5)
WBC: 6.9 10*3/uL (ref 4.0–10.5)
nRBC: 0 % (ref 0.0–0.2)

## 2023-03-16 LAB — TROPONIN I (HIGH SENSITIVITY)
Troponin I (High Sensitivity): 13 ng/L (ref <18)
Troponin I (High Sensitivity): 13 ng/L (ref <18)

## 2023-03-16 LAB — D-DIMER, QUANTITATIVE: D-Dimer, Quant: 1.15 ug/mL-FEU — ABNORMAL HIGH (ref 0.00–0.50)

## 2023-03-16 MED ORDER — TORSEMIDE 20 MG PO TABS: 60.0000 mg | ORAL_TABLET | Freq: Every day | ORAL | 0 refills | Status: DC

## 2023-03-16 MED ORDER — IOHEXOL 350 MG/ML SOLN
75.0000 mL | Freq: Once | INTRAVENOUS | Status: AC | PRN
  Administered 2023-03-16: 75 mL via INTRAVENOUS

## 2023-03-16 NOTE — ED Notes (Signed)
Patients O2 increased to 5 lpm due to low O2 sats.

## 2023-03-16 NOTE — ED Triage Notes (Signed)
Patient sent from IM clinic today for possible PE study d/t elevated d dimer results from the lab today. Patient having chest pain and shortness of breath for a number of weeks.

## 2023-03-16 NOTE — ED Provider Notes (Signed)
[place] EMERGENCY DEPARTMENT AT [facility] Provider Note   CSN: [id number] Arrival date & time: 03/16/23  1806     History  Chief Complaint  Patient presents with   Shortness of Breath    Alicia [name] is a 67 y.o. female.  The history is provided by the patient and medical records. No language interpreter was used.  Shortness of Breath Severity:  Moderate Onset quality:  Gradual Timing:  Constant Progression:  Waxing and waning Chronicity:  Recurrent Context: URI (imprioved)   Relieved by:  Nothing Worsened by:  Nothing Ineffective treatments:  None tried Associated symptoms: cough   Associated symptoms: no abdominal pain, no chest pain, no fever, no headaches, no neck pain, no rash, no vomiting and no wheezing        Home Medications Prior to Admission medications   Medication Sig Start Date End Date Taking? Authorizing Provider  ACCU-CHEK AVIVA PLUS test strip USE TO TEST BLOOD SUGARS AS DIRECTED 10/26/22   [name], MD  albuterol (VENTOLIN HFA) 108 (90 Base) MCG/ACT inhaler TAKE 2 PUFFS BY MOUTH EVERY 6 HOURS AS NEEDED 01/12/23   Charlott Holler, MD  amLODipine (NORVASC) 10 MG tablet TAKE 1 TABLET BY MOUTH EVERY DAY 05/11/22   [name], MD  aspirin 81 MG tablet Take 81 mg by mouth daily.    [provider]  atorvastatin (LIPITOR) 80 MG tablet Take 1 tablet (80 mg total) by mouth daily. 06/25/22   Swinyer, Zachary George, NP  B-D UF III MINI PEN NEEDLES 31G X 5 MM MISC USE 3 TIMES A DAY WITH HUMALOG 08/10/22   [name], MD  Dulaglutide (TRULICITY) 0.75 MG/0.5ML SOPN Inject 0.75 mg into the skin once a week. 12/22/22   [name], MD  ezetimibe (ZETIA) 10 MG tablet Take 1 tablet (10 mg total) by mouth daily. 09/09/22   Swinyer, Zachary George, NP  insulin glargine (LANTUS) 100 UNIT/ML injection Inject 0.2 mLs (20 Units total) into the skin at bedtime. 02/01/23   [name], MD  insulin lispro  (HUMALOG) 100 UNIT/ML KwikPen Inject 14 units before breakfast and lunch, inject 20 units before dinner 02/01/23   [name], MD  Insulin Pen Needle 32G X 4 MM MISC Use to inject insulin 4 times a day. The patient is insulin requiring, ICD 10 code 11.10. The patient injects 4 times per day. 04/28/18   Doneen Poisson, MD  Insulin Syringe-Needle U-100 31G X 15/64" 0.3 ML MISC Use to inject Humalog before meals three times a day 05/31/19   [name], MD  Lancets (ACCU-CHEK MULTICLIX) lancets Use to check your blood sugar four times daily: early morning, before a meal, two hours after a meal, and bedtime 01/16/21   [name], MD  mometasone-formoterol (DULERA) 100-5 MCG/ACT AERO Inhale 2 puffs into the lungs 2 (two) times daily. 11/09/22 02/08/24  [name], MD  nystatin powder Apply 1 Application topically 3 (three) times daily. 08/17/22   [name], MD  sacubitril-valsartan (ENTRESTO) 97-103 MG Take 1 tablet by mouth 2 (two) times daily. 11/19/22   [name], MD  senna (SENOKOT) 8.6 MG TABS tablet Take 2 tablets (17.2 mg total) by mouth daily. 04/14/21   [name], MD  spironolactone (ALDACTONE) 25 MG tablet Take 1 tablet (25 mg total) by mouth daily. 12/22/22   Tereso Newcomer T, PA-C  tiotropium (SPIRIVA HANDIHALER) 18 MCG inhalation capsule Place 1 capsule (  18 mcg total) into inhaler and inhale daily. 02/11/23   Marrianne Mood, MD  torsemide (DEMADEX) 20 MG tablet Take 3 tablets (60 mg total) by mouth daily. 03/16/23   Quincy Simmonds, MD  insulin aspart (NOVOLOG) 100 UNIT/ML injection Inject 4.5 Units into the skin at bedtime. Prescribed 3 times daily, but takes only once because she is afraid of running out  11/01/13  [provider]      Allergies    Beta adrenergic blockers and Canagliflozin    Review of Systems   Review of Systems  Constitutional:  Negative for chills, fatigue and fever.  HENT:   Negative for congestion.   Eyes:  Negative for visual disturbance.  Respiratory:  Positive for cough, chest tightness and shortness of breath. Negative for wheezing.   Cardiovascular:  Negative for chest pain, palpitations and leg swelling.  Gastrointestinal:  Negative for abdominal pain, constipation, diarrhea, nausea and vomiting.  Genitourinary:  Negative for dysuria.  Musculoskeletal:  Negative for back pain, neck pain and neck stiffness.  Skin:  Negative for rash and wound.  Neurological:  Negative for headaches.  Psychiatric/Behavioral:  Negative for agitation and confusion.   All other systems reviewed and are negative.   Physical Exam Updated Vital Signs BP (!) 172/81 (BP Location: Right Arm)   Pulse 68   Temp 99 F (37.2 C) (Oral)   Resp 18   Ht 5\' 7"  (1.702 m)   Wt 98.4 kg   LMP  (LMP Unknown)   SpO2 91%   BMI 33.99 kg/m  Physical Exam Vitals and nursing note reviewed.  Constitutional:      General: She is not in acute distress.    Appearance: She is well-developed. She is not ill-appearing, toxic-appearing or diaphoretic.  HENT:     Head: Normocephalic and atraumatic.  Eyes:     Conjunctiva/sclera: Conjunctivae normal.     Pupils: Pupils are equal, round, and reactive to light.  Cardiovascular:     Rate and Rhythm: Normal rate and regular rhythm.     Heart sounds: No murmur heard. Pulmonary:     Effort: Pulmonary effort is normal. No tachypnea or respiratory distress.     Breath sounds: Rhonchi and rales present. No wheezing.  Chest:     Chest wall: No tenderness.  Abdominal:     Palpations: Abdomen is soft.     Tenderness: There is no abdominal tenderness.  Musculoskeletal:        General: No swelling.     Cervical back: Neck supple.     Right lower leg: No tenderness. Edema present.     Left lower leg: No tenderness. Edema present.  Skin:    General: Skin is warm and dry.     Capillary Refill: Capillary refill takes less than 2 seconds.      Findings: No erythema.  Neurological:     General: No focal deficit present.     Mental Status: She is alert.  Psychiatric:        Mood and Affect: Mood normal.     ED Results / Procedures / Treatments   Labs (all labs ordered are listed, but only abnormal results are displayed) Labs Reviewed  BASIC METABOLIC PANEL - Abnormal; Notable for the following components:      Result Value   Chloride 90 (*)    Glucose, Bld 323 (*)    Calcium 8.5 (*)    All other components within normal limits  CBC WITH DIFFERENTIAL/PLATELET - Abnormal; Notable for  the following components:   RBC 3.73 (*)    Hemoglobin 9.8 (*)    HCT 31.9 (*)    RDW 16.3 (*)    All other components within normal limits  BRAIN NATRIURETIC PEPTIDE - Abnormal; Notable for the following components:   B Natriuretic Peptide 317.6 (*)    All other components within normal limits  TROPONIN I (HIGH SENSITIVITY)  TROPONIN I (HIGH SENSITIVITY)    EKG None  Radiology CT Angio Chest PE W/Cm &/Or Wo Cm  Result Date: 03/16/2023 CLINICAL DATA:  6 Pulmonary embolism (PE) suspected, low to intermediate prob, positive D-dimer EXAM: CT ANGIOGRAPHY CHEST WITH CONTRAST TECHNIQUE: Multidetector CT imaging of the chest was performed using the standard protocol during bolus administration of intravenous contrast. Multiplanar CT image reconstructions and MIPs were obtained to evaluate the vascular anatomy. RADIATION DOSE REDUCTION: This exam was performed according to the departmental dose-optimization program which includes automated exposure control, adjustment of the mA and/or kV according to patient size and/or use of iterative reconstruction technique. CONTRAST:  75mL OMNIPAQUE IOHEXOL 350 MG/ML SOLN COMPARISON:  08/12/2022 FINDINGS: Cardiovascular: Cardiomegaly. Coronary artery and aortic calcifications. No evidence of aortic aneurysm or dissection. No filling defects in the pulmonary arteries to suggest pulmonary emboli.  Mediastinum/Nodes: Mildly enlarged mediastinal lymph nodes. Right paratracheal lymph node has a short axis diameter of 13 mm compared to 10 mm previously. Borderline sized prevascular and AP window lymph nodes. No axillary or hilar adenopathy. Lungs/Pleura: Linear areas of atelectasis throughout the lungs. No confluent opacities or effusions. Upper Abdomen: No acute findings Musculoskeletal: Chest wall soft tissues are unremarkable. No acute bony abnormality. Review of the MIP images confirms the above findings. IMPRESSION: No evidence of pulmonary embolus. Cardiomegaly, coronary artery disease. Mild mediastinal adenopathy. Legrand Rams this is reactive. This could be followed with repeat CT in 6 months to assess for stability. Areas of atelectasis throughout the lungs. Aortic Atherosclerosis (ICD10-I70.0). Electronically Signed   By: Charlett Nose M.D.   On: 03/16/2023 21:05   DG Chest 2 View  Result Date: 03/16/2023 CLINICAL DATA:  Increased shortness of breath, chest discomfort, weakness EXAM: CHEST - 2 VIEW COMPARISON:  02/06/2023 FINDINGS: Mild cardiomegaly and prominence of the central pulmonary vasculature, which appear similar to the prior exam. Improved basilar opacities, with a persistent trace pleural effusion on the left. No acute osseous abnormality. IMPRESSION: 1. Improved basilar opacities, with a persistent trace pleural effusion on the left, which could reflect mild pulmonary edema. 2. Mild cardiomegaly with central pulmonary vascular congestion. Electronically Signed   By: Wiliam Ke M.D.   On: 03/16/2023 16:22    Procedures Procedures    Medications Ordered in ED Medications  iohexol (OMNIPAQUE) 350 MG/ML injection 75 mL (75 mLs Intravenous Contrast Given 03/16/23 2042)    ED Course/ Medical Decision Making/ A&P                             Medical Decision Making   Chelsa Stout Keng is a 67 y.o. female with a past medical history significant for hypertension, diabetes, COPD on 4 L  home oxygen, CAD, GERD, CHF with recent admission, previous small bowel obstruction, previous strokes, and previous cholecystectomy who presents at the direction of her PCP for further evaluation of shortness of breath, cough, fatigue, and lab abnormalities.  According to patient, she has had a cough for the last week or 2 and worsening shortness of breath.  It is much worse  with exertion.  She reports it was just admitted the hospital several weeks ago for heart failure and fluid overloaded.  She thought that it was starting to better but now seems to be worsening.  She does not want to get swollen in the legs at this time but feels like she has fluid in her lungs.  She reports no chest pain but has diffuse body aching still.  She is denying any nausea, vomiting, constipation, diarrhea, or urinary changes.  Denies trauma.  She went to her doctor's office and when they try to walk her on her home oxygen she had hypoxia and worsening breathing.  They increased her oxygen to 5 L and sent her in.  She reports she had a D-dimer earlier that was elevated and concerning for blood clot.  Patient was sent in to get CT scan and reassess.  She reports that she has had some wheezing and thinks that her breathing may have worsened due to environmental changes but does not seem to be wheezing now.  On my exam, lungs have coarseness and rales but I do not appreciate wheezing.  Her chest was nontender and abdomen was nontender.  Bowel sounds were appreciated.  Legs have very minimal edema and good pulses.  Patient is resting.  She is on 5 and half liters nasal cannula and oxygen saturations are in the low 90s.  She is in no acute distress at this time and is answering questions appropriately.  Will get CT PE study given the recent elevated D-dimer.  Will check other labs.  Will wait for results to return but I anticipate due to her worsening breathing and increased oxygen requirement she will likely need admission for  further management.  CT PE study does not show evidence of pulm embolism and did not show convincing evidence of large pneumonia.  There were some lymph nodes that were likely reactive based on report.  Troponin flat and negative and BNP is improving.  Patient was weaned down to her home 4 L and has maintain oxygen saturations and is feeling no further shortness of breath at this time.  Patient proved ability for over 4 hours and we had a shared decision-making conversation.  We discussed the possibly of admission given the transient hypoxia she reportedly had earlier but given her reassuring workup she would like to go home.  Patient will call her doctor to get seen soon and understood return precautions.  She no other questions or concerns and was discharged in stable condition.        Final Clinical Impression(s) / ED Diagnoses Final diagnoses:  SOB (shortness of breath)    Rx / DC Orders ED Discharge Orders     None      Clinical Impression: 1. SOB (shortness of breath)     Disposition: Discharge  Condition: Good  I have discussed the results, Dx and Tx plan with the pt(& family if present). He/she/they expressed understanding and agree(s) with the plan. Discharge instructions discussed at great length. Strict return precautions discussed and pt &/or family have verbalized understanding of the instructions. No further questions at time of discharge.    New Prescriptions   No medications on file    Follow Up: [name], MD [address] [place] [place] [address] [phone]     [facility] Emergency Department at [facility] [address] [id number] mc [place] [address] [phone]       Tam Savoia, Canary Brim,  MD 03/16/23 2356

## 2023-03-16 NOTE — Assessment & Plan Note (Signed)
Patient presents for worsening dyspnea for the last week with associated orthopnea and abdominal bloating. Acute episodes of shortness of breath while at ophthalmology office yesterday and had to leave without being seen. Reports nausea, dizziness, and feeling of presyncope at that time. Denies any LOC or falls since this started. Recent change in her diuretics, jardiance discontinues and  now on torsemide 60 mg daily. She is taking torsemide 20 mg three times daily rather than 60 mg once daily due confusion with the bottle instructions. Weight is elevated to 217 lbs. Dry weight is around 210 pounds. PE also a concern given her acute symptom yesterday. Ddimer is positive

## 2023-03-16 NOTE — Discharge Instructions (Signed)
Your history, exam, workup today led [name] to do a large workup that was overall reassuring.  Your CT scan did not show evidence of pulmonary embolism as they were concerned about sending you here.  There was also no evidence of large pneumonia and your ox injections were able to stay in the 90s on the home 4 L.  Your cardiac enzymes called troponins were negative both times we checked them and were not rising.  Your BNP to look for fluid overload and heart failure was improved from prior.  Given your stability for over 4 hours and otherwise similar workup to prior, we feel you are safe for discharge home given your lack of hypoxia here.  That being said, if any symptoms are to change or worsen acutely, please return to the nearest emergency department please follow-up with your regular doctor.  Please rest and stay hydrated.

## 2023-03-16 NOTE — ED Provider Triage Note (Signed)
Emergency Medicine Provider Triage Evaluation Note  Alicia [name] , a 67 y.o. female  was evaluated in triage.  Pt complains of shortness of breath, chest pain.  Went to PCP office today, called and told her D-dimer was elevated and she to come to the ER.. No hx of PE or DVT. Echo 02/07/23 with EF 60-65% Review of Systems  Positive:  Negative:   Physical Exam  BP (!) 172/81 (BP Location: Right Arm)   Pulse 68   Temp 99 F (37.2 C) (Oral)   Resp 18   LMP  (LMP Unknown)   SpO2 91%  Gen:   Awake, no distress   Resp:  Normal effort  MSK:   Moves extremities without difficulty  Other:  No lower ext edema  Medical Decision Making  Medically screening exam initiated at 6:38 PM.  Appropriate orders placed.  Alicia [name] was informed that the remainder of the evaluation will be completed by another provider, this initial triage assessment does not replace that evaluation, and the importance of remaining in the ED until their evaluation is complete.     Jeannie Fend, PA-C 03/16/23 1839

## 2023-03-16 NOTE — Telephone Encounter (Signed)
Patient called in stating she has not felt well since last week. States she was at eye doctor yesterday and could not stay for appt. She began shaking, feeling nauseated, off balance. States she is [facility] with ambulating even though she is on 4 L of oxygen. Denies CP, weight gain, SHOB at rest. She was given appt this afternoon with Yellow Team Provider.

## 2023-03-16 NOTE — Patient Instructions (Addendum)
It was a pleasure seeing you in clinic today  I think you difficulty breathing is due to excess fluid. Please take torsemide 40 mg this evening and then start torsemide 60 mg in the daily in the mornings starting tomorrow. 4/24  We will check labs and a chest x ray as well and I will give you a call with the results and check on how you feel in the morning  Please go to the emergency room if you are having low oxygen less than 88% even when sitting still or laying down or if the shortness of breath is not improving  Follow-up in 1 week for shortness of breath

## 2023-03-17 LAB — BMP8+ANION GAP
Anion Gap: 16 mmol/L (ref 10.0–18.0)
BUN/Creatinine Ratio: 17 (ref 12–28)
BUN: 14 mg/dL (ref 8–27)
CO2: 31 mmol/L — ABNORMAL HIGH (ref 20–29)
Calcium: 8.6 mg/dL — ABNORMAL LOW (ref 8.7–10.3)
Chloride: 90 mmol/L — ABNORMAL LOW (ref 96–106)
Creatinine, Ser: 0.83 mg/dL (ref 0.57–1.00)
Glucose: 151 mg/dL — ABNORMAL HIGH (ref 70–99)
Potassium: 3.8 mmol/L (ref 3.5–5.2)
Sodium: 137 mmol/L (ref 134–144)
eGFR: 78 mL/min/{1.73_m2} (ref >59)

## 2023-03-17 NOTE — Assessment & Plan Note (Addendum)
Patient presents with 1 week of increasing shortness of breath and orthopnea.  Associated with feeling weak with standing.  Shortness of breath is worse with activity.  Yesterday was at the ophthalmology office with acutely worsening shortness of breath and had to leave before being seen. This was associated with a discomfort in her chest she was unable to quantify. This lasted about half an hour and improved after resting at home. Notes chest discomfort when walking up stairs and needs to crawl to prevent falling.  Notes her clothing has been feeling more constricting comfortably at her waist and under her chest.  She is currently on her home nasal cannula and has not checked her oxygen at home... Some increase in cough a few days ago but now no longer cough. Denies fever, wheezing, palpitations, loc, or falls.  Had some recent medication changes after hospitalization for heart failure exacerbation in March.  Her Jardiance was stopped and her Lasix was transitioned to torsemide 60 mg daily.  She has been taking torsemide 20 mg 3 times a day since then.  On exam BP and heart rate are stable.  Patient has increased work of breathing and desaturated to 87% on 4 L nasal cannula after walking a few steps.  Weight is increased to 217 pounds which is also consistent with her scale at home.  Her orthostatic vitals were negative.  Increased abdominal distention without lower extremity edema.  Was unable to tolerate laying back with JVP to the mid neck while sitting completely upright.  Crackles heard in bilateral lung bases.  EKG without acute ischemic changes.  Overall appears fluid overloaded.  Suspect this is due to change in her diuretic regimen.  Given her acute shortness of breath yesterday also concern for PE.  Will check BNP, BMP, chest x-ray and D-dimer today.  Will have her take an additional 40 mg of torsemide tonight in addition to the 20 mg she took this morning.  Will have her continue with torsemide 60 mg  daily.  Continue Entresto and spironolactone.  Will call patient in the morning.  Patient lives with her daughter who is at home in the evenings.  Strict return precautions given. Follow up in 1 week.

## 2023-03-17 NOTE — Addendum Note (Signed)
Addended by: Quincy Simmonds on: 03/17/2023 05:18 PM   Modules accepted: Level of Service

## 2023-03-17 NOTE — Progress Notes (Signed)
Established Patient Office Visit  Subjective   Patient ID: Alicia [name], female    DOB: [birth year]  Age: 67 y.o. MRN: [id number]  Chief Complaint  Patient presents with   Follow-up    Breathing problmen/ feel like she going to fall when standing / chest pain when walking pain starts     Alicia [name] is a 67 y.o. person living with a history listed below who presents to clinic for worsening dyspnea. Please refer to problem based charting for further details and assessment and plan of current problem and chronic medical conditions.    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 09/24/2017   Seasonal allergic rhinitis due to pollen 09/24/2017   Healthcare maintenance 09/24/2017   Obesity (BMI 30.0-34.9) 09/24/2017   Hyperlipidemia    Uterine prolapse 02/12/2016   Coronary artery disease involving native coronary artery of native heart without angina pectoris 11/09/2014   Chronic systolic heart failure 05/10/2014   COPD (chronic obstructive pulmonary disease) 02/18/2014   Type 2 diabetes mellitus with moderate nonproliferative diabetic retinopathy 10/15/2013   Essential hypertension 10/15/2013   ROS: negative as per HPI    Objective:     BP (!) 164/77 (BP Location: Right Arm, Patient Position: Sitting, Cuff Size: Normal)   Pulse 64   Temp 98.2 F (36.8 C) (Oral)   Ht 5\' 7"  (1.702 m)   Wt 217 lb 11.2 oz (98.7 kg)   LMP  (LMP Unknown)   SpO2 93% Comment: with portable  oxygen at 4 liters  BMI 34.10 kg/m    Physical Exam Constitutional:      Appearance: She is obese.     Comments: Chronically ill appearing, mild distress  HENT:     Mouth/Throat:     Mouth: Mucous membranes are moist.     Pharynx: Oropharynx is clear.  Eyes:     Extraocular Movements: Extraocular movements intact.     Conjunctiva/sclera: Conjunctivae normal.     Pupils: Pupils are equal, round, and reactive to light.  Cardiovascular:     Rate and Rhythm: Normal rate and regular rhythm.     Pulses: Normal pulses.     Heart sounds: No murmur heard.    Comments: JVP to mid neck at 90 degress, no LE edema Pulmonary:     Comments: Increased work of breathing, bilateral crackles in lower lung fields, no wheezing Abdominal:     General: Abdomen is flat. Bowel sounds are normal. There is distension.     Palpations: Abdomen is soft.     Tenderness: There is no abdominal tenderness.  Musculoskeletal:        General: Normal range of motion.     Cervical back: Normal range of motion.     Right lower leg: No edema.     Left lower leg: No edema.  Lymphadenopathy:     Cervical: No cervical adenopathy.  Skin:    General: Skin is warm and dry.     Capillary Refill: Capillary refill takes less than 2 seconds.  Neurological:     General: No focal deficit present.     Mental Status: She is alert and oriented to person, place, and time. Mental status is at baseline.  Psychiatric:        Mood and Affect: Mood normal.        Behavior: Behavior normal.     Results for orders placed or performed during the hospital encounter of 03/16/23  Basic metabolic panel  Result Value Ref Range   Sodium 135 135 - 145 mmol/L   Potassium 3.7 3.5 - 5.1 mmol/L   Chloride 90 (L) 98 - 111 mmol/L   CO2 32 22 - 32 mmol/L   Glucose, Bld 323 (H) 70 - 99 mg/dL   BUN 13 8 - 23 mg/dL   Creatinine, Ser 4.09 0.44 - 1.00 mg/dL   Calcium 8.5 (L) 8.9 - 10.3 mg/dL   GFR, Estimated >81 >19 mL/min   Anion gap 13 5  - 15  CBC with Differential  Result Value Ref Range   WBC 6.9 4.0 - 10.5 K/uL   RBC 3.73 (L) 3.87 - 5.11 MIL/uL   Hemoglobin 9.8 (L) 12.0 - 15.0 g/dL   HCT 14.7 (L) 82.9 - 56.2 %   MCV 85.5 80.0 - 100.0 fL   MCH 26.3 26.0 - 34.0 pg   MCHC 30.7 30.0 - 36.0 g/dL   RDW 13.0 (H) 86.5 - 78.4 %   Platelets 286 150 - 400 K/uL   nRBC 0.0 0.0 - 0.2 %   Neutrophils Relative % 75 %   Neutro Abs 5.2 1.7 - 7.7 K/uL   Lymphocytes Relative 17 %   Lymphs Abs 1.2 0.7 - 4.0 K/uL   Monocytes Relative 5 %   Monocytes Absolute 0.4 0.1 - 1.0 K/uL   Eosinophils Relative 2 %   Eosinophils Absolute 0.2 0.0 - 0.5 K/uL   Basophils Relative 1 %   Basophils Absolute 0.0 0.0 - 0.1 K/uL   Immature Granulocytes 0 %   Abs Immature Granulocytes 0.03 0.00 - 0.07 K/uL  Brain natriuretic peptide  Result Value Ref Range   B Natriuretic Peptide 317.6 (H) 0.0 - 100.0 pg/mL  Troponin I (High Sensitivity)  Result Value Ref Range   Troponin I (High Sensitivity) 13 <18 ng/L  Troponin I (High Sensitivity)  Result Value Ref Range   Troponin I (High Sensitivity) 13 <18 ng/L  Results for orders placed or performed in visit on 03/16/23  D-dimer, quantitative (not at [facility])  Result Value Ref Range   D-Dimer, Quant 1.15 (H) 0.00 - 0.50 ug/mL-FEU  Brain natriuretic peptide  Result Value Ref Range   B Natriuretic Peptide 418.2 (H) 0.0 - 100.0 pg/mL      The 10-year ASCVD risk score (Arnett DK, et al., 2019) is: 27%    Assessment & Plan:   Problem List Items Addressed This Visit       Cardiovascular and Mediastinum   Chronic systolic heart failure    Patient presents with 1 week of increasing shortness of breath and orthopnea.  Associated with feeling weak with standing.  Shortness of breath is worse with activity.  Yesterday was at the ophthalmology office with acutely worsening shortness of breath and had to leave before being seen. This was associated with a discomfort in her chest she was unable to quantify.  This lasted about half an hour and improved after she was able to get home and rested.  Notes her clothing has been feeling more constricting comfortably at her  waist and under her chest.  She is currently on her home nasal cannula and has not checked her oxygen at home... Some increase in cough a few days ago but now no longer cough. Denies fever, wheezing, palpitations, loc, or falls.  Had some recent medication changes after hospitalization for heart failure exacerbation in March.  Her Jardiance was stopped and her Lasix was transitioned to torsemide 60 mg daily.  She has been taking torsemide 20 mg 3 times a day since then.  On exam BP and heart rate are stable.  Patient has increased work of breathing and desaturated to 87% on 4 L nasal cannula after walking a few steps.  Weight is increased to 217 pounds which is also consistent with her scale at home.  Her orthostatic vitals were negative.  Increased abdominal distention without lower extremity edema.  Was unable to tolerate laying back with JVP to the mid neck while sitting completely upright.  Crackles heard in bilateral lung bases.  EKG without acute ischemic changes.  Overall appears fluid overloaded.  Suspect this is due to change in her diuretic regimen.  Given her acute shortness of breath yesterday also concern for PE.  Will check BNP, BMP, chest x-ray and D-dimer today.  Will have her take an additional 40 mg of torsemide tonight in addition to the 20 mg she took this morning.  Will have her continue with torsemide 60 mg daily.  Continue Entresto and spironolactone.  Will call patient in the morning.  Patient lives with her daughter who is at home in the evenings.  Strict return precautions given. Follow up in 1 week.      Relevant Medications   torsemide (DEMADEX) 20 MG tablet     Other   Dyspnea   Relevant Orders   D-dimer, quantitative (not at [facility]) (Completed)   Brain natriuretic peptide (Completed)   Other Visit Diagnoses      Chest discomfort    -  Primary   Relevant Orders   EKG 12-Lead (Completed)   BMP8+Anion Gap   D-dimer, quantitative (not at [facility]) (Completed)   DG Chest 2 View (Completed)       Return in about 1 week (around 03/23/2023).    Quincy Simmonds, MD

## 2023-03-20 DIAGNOSIS — J9611 Chronic respiratory failure with hypoxia: Secondary | ICD-10-CM | POA: Diagnosis not present

## 2023-03-21 NOTE — Progress Notes (Signed)
Internal Medicine Clinic Attending ? ?Case discussed with Dr. Liang  At the time of the visit.  We reviewed the resident?s history and exam and pertinent patient test results.  I agree with the assessment, diagnosis, and plan of care documented in the resident?s note. ? ?

## 2023-03-22 ENCOUNTER — Ambulatory Visit: Payer: 59 | Attending: Physician Assistant

## 2023-03-22 DIAGNOSIS — E785 Hyperlipidemia, unspecified: Secondary | ICD-10-CM | POA: Diagnosis not present

## 2023-03-22 DIAGNOSIS — I5032 Chronic diastolic (congestive) heart failure: Secondary | ICD-10-CM | POA: Diagnosis not present

## 2023-03-22 DIAGNOSIS — I2510 Atherosclerotic heart disease of native coronary artery without angina pectoris: Secondary | ICD-10-CM

## 2023-03-23 ENCOUNTER — Encounter: Payer: Self-pay | Admitting: Student

## 2023-03-23 ENCOUNTER — Other Ambulatory Visit: Payer: Self-pay

## 2023-03-23 ENCOUNTER — Ambulatory Visit (INDEPENDENT_AMBULATORY_CARE_PROVIDER_SITE_OTHER): Payer: 59 | Admitting: Student

## 2023-03-23 VITALS — BP 140/60 | HR 64 | Temp 98.0°F | Ht 67.0 in | Wt 215.3 lb

## 2023-03-23 DIAGNOSIS — I5022 Chronic systolic (congestive) heart failure: Secondary | ICD-10-CM | POA: Diagnosis not present

## 2023-03-23 DIAGNOSIS — R197 Diarrhea, unspecified: Secondary | ICD-10-CM | POA: Diagnosis not present

## 2023-03-23 DIAGNOSIS — Z87891 Personal history of nicotine dependence: Secondary | ICD-10-CM

## 2023-03-23 DIAGNOSIS — I110 Hypertensive heart disease with heart failure: Secondary | ICD-10-CM | POA: Diagnosis not present

## 2023-03-23 DIAGNOSIS — I10 Essential (primary) hypertension: Secondary | ICD-10-CM

## 2023-03-23 LAB — LIPID PANEL
Chol/HDL Ratio: 3.1 ratio (ref 0.0–4.4)
Cholesterol, Total: 162 mg/dL (ref 100–199)
HDL: 52 mg/dL (ref >39)
LDL Chol Calc (NIH): 85 mg/dL (ref 0–99)
Triglycerides: 146 mg/dL (ref 0–149)
VLDL Cholesterol Cal: 25 mg/dL (ref 5–40)

## 2023-03-23 LAB — COMPREHENSIVE METABOLIC PANEL
ALT: 15 IU/L (ref 0–32)
AST: 19 IU/L (ref 0–40)
Albumin/Globulin Ratio: 1.4 (ref 1.2–2.2)
Albumin: 3.9 g/dL (ref 3.9–4.9)
Alkaline Phosphatase: 90 IU/L (ref 44–121)
BUN/Creatinine Ratio: 21 (ref 12–28)
BUN: 19 mg/dL (ref 8–27)
Bilirubin Total: 0.3 mg/dL (ref 0.0–1.2)
CO2: 31 mmol/L — ABNORMAL HIGH (ref 20–29)
Calcium: 7.8 mg/dL — ABNORMAL LOW (ref 8.7–10.3)
Chloride: 93 mmol/L — ABNORMAL LOW (ref 96–106)
Creatinine, Ser: 0.91 mg/dL (ref 0.57–1.00)
Globulin, Total: 2.7 g/dL (ref 1.5–4.5)
Glucose: 173 mg/dL — ABNORMAL HIGH (ref 70–99)
Potassium: 4.5 mmol/L (ref 3.5–5.2)
Sodium: 141 mmol/L (ref 134–144)
Total Protein: 6.6 g/dL (ref 6.0–8.5)
eGFR: 70 mL/min/{1.73_m2} (ref >59)

## 2023-03-23 NOTE — Patient Instructions (Addendum)
It was a pleasure seeing you in clinic today.  I am glad you are feeling better today Please start taking you spironolactone 25 mg daily Check to make sure you have entresto at home and are taking this twice daily   Follow up in 1 month with Dr. Oswaldo Done

## 2023-03-23 NOTE — Progress Notes (Unsigned)
   Established Patient Office Visit  Subjective   Patient ID: Alicia [name], female    DOB: Oct 25, 1956  Age: 67 y.o. MRN: [id number]  Chief Complaint  Patient presents with   Follow-up    1 wk f/u     HPI  {History (Optional):23778}  ROS    Objective:     BP (!) 145/61 (BP Location: Left Arm, Patient Position: Sitting, Cuff Size: Large)   Pulse 60   Temp 98 F (36.7 C) (Oral)   Ht 5\' 7"  (1.702 m)   Wt 215 lb 4.8 oz (97.7 kg)   LMP  (LMP Unknown)   SpO2 (!) 89% Comment: on 4 lt of o2  BMI 33.72 kg/m  {Vitals History (Optional):23777}  Physical Exam   No results found for any visits on 03/23/23.  {Labs (Optional):23779}  The 10-year ASCVD risk score (Arnett DK, et al., 2019) is: 22.8%    Assessment & Plan:   Problem List Items Addressed This Visit   None   No follow-ups on file.    Quincy Simmonds, MD

## 2023-03-24 ENCOUNTER — Telehealth: Payer: Self-pay | Admitting: *Deleted

## 2023-03-24 DIAGNOSIS — E785 Hyperlipidemia, unspecified: Secondary | ICD-10-CM

## 2023-03-24 DIAGNOSIS — R197 Diarrhea, unspecified: Secondary | ICD-10-CM | POA: Insufficient documentation

## 2023-03-24 NOTE — Assessment & Plan Note (Addendum)
Breathing feels improved from last week on torsemide 60 mg in the morning. Weight is down 2 lbs from las visit. She reports increase urinary output.  Intial saturation on home 4 L 89% but improved to 100% with rest. This is improved from prior OV. Not having as much orthopnea but does use 2 pillows which is baseline for the past several years. Reports she has not been on spironolactone. Some medication confusion as her [place] Pepper was stopped at her last admission due to UTI.  Will have her restart spironolactone 25 mg daily.

## 2023-03-24 NOTE — Assessment & Plan Note (Signed)
Reports 1 day of diarrhea after eating spicy food yesterday.  5 episodes of diarrhea last night and 1 episode of nonbloody emesis.  No fevers or chills.  Feeling this morning but still has some nausea.  She was able to eat and drink for breakfast.  States this sometimes happens after having spicy food since her cholecystectomy. Suspect GI upset after diet change. Symptoms overall improving. Encouraged PO hydration. Return precautions given.

## 2023-03-24 NOTE — Telephone Encounter (Signed)
-----   Message from Beatrice Lecher, [place] sent at 03/23/2023  5:11 PM EDT ----- Glucose elevated (patient is a known diabetic).  Creatinine, K+, LFTs normal.  Calcium low.  Chloride/CO2 stable.  LDL cholesterol above goal. PLAN:  -Follow-up with PCP for diabetes, low calcium -Refer to Pharm.D. lipid clinic for consideration of PCSK9 inhibitor Tereso Newcomer, PA-C    03/23/2023 5:09 PM

## 2023-03-24 NOTE — Assessment & Plan Note (Signed)
Blood pressure 140/60 today low diastolic pressures.  Has not been taking her Aldactone due to some medication confusion restart spironolactone 25 mg daily.

## 2023-03-31 ENCOUNTER — Ambulatory Visit (INDEPENDENT_AMBULATORY_CARE_PROVIDER_SITE_OTHER): Payer: 59 | Admitting: Student

## 2023-03-31 ENCOUNTER — Encounter: Payer: Self-pay | Admitting: Student

## 2023-03-31 VITALS — BP 154/69 | HR 66 | Ht 67.0 in | Wt 216.8 lb

## 2023-03-31 DIAGNOSIS — H6121 Impacted cerumen, right ear: Secondary | ICD-10-CM | POA: Insufficient documentation

## 2023-03-31 DIAGNOSIS — I10 Essential (primary) hypertension: Secondary | ICD-10-CM

## 2023-03-31 MED ORDER — CARBAMIDE PEROXIDE 6.5 % OT SOLN: 5.0000 [drp] | Freq: Two times a day (BID) | OTIC | 0 refills | Status: AC

## 2023-03-31 NOTE — Progress Notes (Signed)
CC: Hearing loss  HPI:  [name] is a 67 y.o.   Lost hearing on Monday. She states that Sunday night she felt that when she laid on her back she is drooling a lot months ago. She states that the drool went into her ear overnight and her sinuuses are messed up., First was the both ears and then left got better. Thorat scratch. She feels that she is off balance.   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  Non-functioning   Aortic atherosclerosis (HCC) 09/24/2017   Asymptomatic, found on CT scan   Blood transfusion without reported diagnosis    Chronic Systolic Heart Failure 05/10/2014   Felt to be non-ischemic and secondary to hypertension.  Echo (05/28/2014): LVEF 25%.  Is not interested in AICD placement.   COPD exacerbation (HCC)    Coronary artery disease involving native coronary artery of native heart without angina pectoris 11/09/2014   Cardiac cath (02/18/2014): Non-obstructive, mRCA 30%, dRCA 60%   Cystocele with uterine prolapse - grade 3 02/12/2016   Not interested in pessary after trying   Diverticulosis of colon 09/24/2017   Diverticulosis of colon 09/24/2017   Essential hypertension 10/15/2013   Gastroesophageal reflux disease 09/24/2017   History of cerebrovascular accident 05/10/2013   Per patient report 6 previous strokes, most recent on 05/10/13.  No residual deficits.   History of cerebrovascular accident 05/10/2013   Per patient report 6 previous strokes, most recent on 05/10/13.  No residual deficits.   Hyperlipidemia    Normocytic anemia 02/09/2023   Overweight (BMI 25.0-29.9) 09/24/2017   Psoriasis 09/24/2017   Seasonal allergic rhinitis due to pollen 09/24/2017   Spring and early Fall   Small Bowel Obstruction (SBO) 01/13/2014   Ex-Lap & Lysis of Adhesion, 01/15/2014   Thyroid nodule  09/04/2019   Tobacco use disorder 01/15/2014   Type 2 diabetes mellitus with moderate nonproliferative diabetic retinopathy (HCC) 10/15/2013     Current Outpatient Medications:    carbamide peroxide (DEBROX) 6.5 % OTIC solution, Place 5 drops into the right ear 2 (two) times daily for 4 days., Disp: 15 mL, Rfl: 0   ACCU-CHEK AVIVA PLUS test strip, USE TO TEST BLOOD SUGARS AS DIRECTED, Disp: 100 strip, Rfl: 12   albuterol (VENTOLIN HFA) 108 (90 Base) MCG/ACT inhaler, TAKE 2 PUFFS BY MOUTH EVERY 6 HOURS AS NEEDED, Disp: 18 each, Rfl: 5   amLODipine (NORVASC) 10 MG tablet, TAKE 1 TABLET BY MOUTH EVERY DAY, Disp: 90 tablet, Rfl: 3   aspirin 81 MG tablet, Take 81 mg by mouth daily., Disp: , Rfl:    atorvastatin (LIPITOR) 80 MG tablet, Take 1 tablet (80 mg total) by mouth daily., Disp: 90 tablet, Rfl: 3   B-D UF III MINI PEN NEEDLES 31G X 5 MM MISC, USE 3 TIMES A DAY WITH HUMALOG, Disp: 100 each, Rfl: 5   Dulaglutide (TRULICITY) 0.75 MG/0.5ML SOPN, Inject 0.75 mg into the skin once a week., Disp: 6 mL, Rfl: 1   ezetimibe (ZETIA) 10 MG tablet, Take 1 tablet (10 mg total) by mouth daily., Disp: 90 tablet, Rfl: 3   insulin glargine (LANTUS) 100 UNIT/ML injection, Inject 0.2 mLs (20 Units total) into the skin at bedtime., Disp: 10 mL, Rfl: 3   insulin lispro (HUMALOG) 100 UNIT/ML KwikPen, Inject 14 units  before breakfast and lunch, inject 20 units before dinner, Disp: 15 mL, Rfl: 11   Insulin Pen Needle 32G X 4 MM MISC, Use to inject insulin 4 times a day. The patient is insulin requiring, ICD 10 code 11.10. The patient injects 4 times per day., Disp: 400 each, Rfl: 3   Insulin Syringe-Needle U-100 31G X 15/64" 0.3 ML MISC, Use to inject Humalog before meals three times a day, Disp: 100 each, Rfl: 5   Lancets (ACCU-CHEK MULTICLIX) lancets, Use to check your blood sugar four times daily: early morning, before a meal, two hours after a meal, and bedtime, Disp: 100 each, Rfl: 12   mometasone-formoterol  (DULERA) 100-5 MCG/ACT AERO, Inhale 2 puffs into the lungs 2 (two) times daily., Disp: 13 g, Rfl: 3   nystatin powder, Apply 1 Application topically 3 (three) times daily., Disp: 15 g, Rfl: 2   sacubitril-valsartan (ENTRESTO) 97-103 MG, Take 1 tablet by mouth 2 (two) times daily., Disp: 180 tablet, Rfl: 3   senna (SENOKOT) 8.6 MG TABS tablet, Take 2 tablets (17.2 mg total) by mouth daily., Disp: 120 tablet, Rfl: 2   spironolactone (ALDACTONE) 25 MG tablet, Take 1 tablet (25 mg total) by mouth daily., Disp: 90 tablet, Rfl: 3   tiotropium (SPIRIVA HANDIHALER) 18 MCG inhalation capsule, Place 1 capsule (18 mcg total) into inhaler and inhale daily., Disp: 30 capsule, Rfl: 2   torsemide (DEMADEX) 20 MG tablet, Take 3 tablets (60 mg total) by mouth daily., Disp: 90 tablet, Rfl: 0  Review of Systems:    HEENT: Patient concerned of right hearing loss  Physical Exam:  Vitals:   03/31/23 1324 03/31/23 1423  BP: (!) 142/63 (!) 154/69  Pulse: 70 66  SpO2: 95%   Weight: 216 lb 12.8 oz (98.3 kg)   Height: 5\' 7"  (1.702 m)     General: Patient is sitting comfortably in the room  Eyes: No nystagmus on hints exam, no nystagmus on Dix-Hallpike HENT: Right ear canal with cerumen impaction noted, cleared and then reevaluated showing scarred right tympanic membrane.  Left ear canal clear with clear tympanic membrane, no cerumen impaction on the left. Neuro: Cranial nerves intact, 5/5 strength noted to bilateral upper lower extremities, sensation intact, no facial droop appreciated  Assessment & Plan:   Hearing loss of right ear due to cerumen impaction Patient presents to the clinic for concerns of hearing loss.  She states that she was sleeping on Sunday night, and woke up Monday morning with hearing loss.  She also reports she has been having some balance issue as well.  She denies any other concerns such as trouble speaking, vision loss, facial droop, or arm or leg weakness.  On exam, patient does have  cerumen noted to right ear canal, with left ear canal clear.  During office visit, patient had ear canal irrigation, in which patient did get relief.  On further evaluation, patient did have some scarring noted to left tympanic membrane, but no findings suggestive of infection or eardrum rupture. Her hearing did come back.  She states she was not dizzy anymore.  I did consider acute CVA, but less likely given normal exam and improvement in hearing.  Patient had negative Dix-Hallpike maneuver which made BPPV less likely.  Did obtain a hints exam which was negative.  Plan: -Debrox prescribed  Essential hypertension Patient presented with elevated blood pressures.  Patient current regimen includes amlodipine 10 mg daily, spironolactone 25 mg daily, and patient also takes Entresto 97-103 mg  twice daily.  Blood pressure today was elevated at 142/63 with repeat at 154/69.  This is not at goal given patient is a heart failure patient as well as a diabetic.  Plan: -Patient to follow-up in 2 to 3 weeks with blood pressure log -Plan to titrate medication at that time -Continue amlodipine 10 mg daily -Continue spironolactone 25 mg daily -Continue Entresto 97-103 mg tablet twice daily -Counseled the patient on the importance of good blood pressure control  Patient seen with Dr. Princella Pellegrini, DO PGY-1 Internal Medicine Resident  Pager: [phone]

## 2023-03-31 NOTE — Patient Instructions (Addendum)
Alicia [name],Alicia [name] you for allowing me to take part in your care today.  Here are your instructions.  1. Regarding your loss of hearing, you had ear wax that was stuck in your ear. We are going to write you from some ear drops. Please use these as prescribed.   2. Regarding your blood pressure, please come back in about 1-2 weeks to discuss blood pressure. Bring back a log.   Thank you, Dr. Allena Katz  If you have any other questions please contact the internal medicine clinic at [phone]

## 2023-03-31 NOTE — Assessment & Plan Note (Signed)
Patient presents to the clinic for concerns of hearing loss.  She states that she was sleeping on Sunday night, and woke up Monday morning with hearing loss.  She also reports she has been having some balance issue as well.  She denies any other concerns such as trouble speaking, vision loss, facial droop, or arm or leg weakness.  On exam, patient does have cerumen noted to right ear canal, with left ear canal clear.  During office visit, patient had ear canal irrigation, in which patient did get relief.  On further evaluation, patient did have some scarring noted to left tympanic membrane, but no findings suggestive of infection or eardrum rupture. Her hearing did come back.  She states she was not dizzy anymore.  I did consider acute CVA, but less likely given normal exam and improvement in hearing.  Patient had negative Dix-Hallpike maneuver which made BPPV less likely.  Did obtain a hints exam which was negative.  Plan: -Debrox prescribed

## 2023-03-31 NOTE — Assessment & Plan Note (Addendum)
Patient presented with elevated blood pressures.  Patient current regimen includes amlodipine 10 mg daily, spironolactone 25 mg daily, and patient also takes Entresto 97-103 mg twice daily.  Blood pressure today was elevated at 142/63 with repeat at 154/69.  This is not at goal given patient is a heart failure patient as well as a diabetic.  Plan: -Patient to follow-up in 2 to 3 weeks with blood pressure log -Plan to titrate medication at that time -Continue amlodipine 10 mg daily -Continue spironolactone 25 mg daily -Continue Entresto 97-103 mg tablet twice daily -Counseled the patient on the importance of good blood pressure control

## 2023-04-01 ENCOUNTER — Encounter (INDEPENDENT_AMBULATORY_CARE_PROVIDER_SITE_OTHER): Payer: 59 | Admitting: Ophthalmology

## 2023-04-01 ENCOUNTER — Encounter: Payer: Self-pay | Admitting: *Deleted

## 2023-04-01 DIAGNOSIS — I10 Essential (primary) hypertension: Secondary | ICD-10-CM

## 2023-04-01 DIAGNOSIS — H4421 Degenerative myopia, right eye: Secondary | ICD-10-CM | POA: Diagnosis not present

## 2023-04-01 DIAGNOSIS — H35033 Hypertensive retinopathy, bilateral: Secondary | ICD-10-CM

## 2023-04-01 DIAGNOSIS — E113312 Type 2 diabetes mellitus with moderate nonproliferative diabetic retinopathy with macular edema, left eye: Secondary | ICD-10-CM | POA: Diagnosis not present

## 2023-04-01 DIAGNOSIS — Z794 Long term (current) use of insulin: Secondary | ICD-10-CM | POA: Diagnosis not present

## 2023-04-01 DIAGNOSIS — H34832 Tributary (branch) retinal vein occlusion, left eye, with macular edema: Secondary | ICD-10-CM

## 2023-04-01 DIAGNOSIS — H43813 Vitreous degeneration, bilateral: Secondary | ICD-10-CM

## 2023-04-01 NOTE — Progress Notes (Signed)
Internal Medicine Clinic Attending  I saw and evaluated the patient.  I personally confirmed the key portions of the history and exam documented by Dr. Patel and I reviewed pertinent patient test results.  The assessment, diagnosis, and plan were formulated together and I agree with the documentation in the resident's note.  

## 2023-04-01 NOTE — Progress Notes (Signed)
Internal Medicine Clinic Attending ? ?Case discussed with Dr. Liang  At the time of the visit.  We reviewed the resident?s history and exam and pertinent patient test results.  I agree with the assessment, diagnosis, and plan of care documented in the resident?s note. ? ?

## 2023-04-10 ENCOUNTER — Other Ambulatory Visit: Payer: Self-pay | Admitting: Student

## 2023-04-13 ENCOUNTER — Ambulatory Visit: Payer: Self-pay

## 2023-04-13 NOTE — Patient Instructions (Signed)
Visit Information  Thank you for taking time to visit with me today. Please don't hesitate to contact me if I can be of assistance to you.   Following are the goals we discussed today:   Goals Addressed             This Visit's Progress    I want to learn about my CHF and keep it under control       Patient Goals/Self Care Activities: -Patient/Caregiver will self-administer medications as prescribed as evidenced by self-report/primary caregiver report  -Patient/Caregiver will attend all scheduled provider appointments as evidenced by clinician review of documented attendance to scheduled appointments and patient/caregiver report -Patient/Caregiver will call pharmacy for medication refills as evidenced by patient report and review of pharmacy fill history as appropriate -Patient/Caregiver will call provider office for new concerns or questions as evidenced by review of documented incoming telephone call notes and patient report -Patient/Caregiver verbalizes understanding of plan -Patient/Caregiver will focus on medication adherence by taking medications as prescribed  -Weigh daily and record (notify MD with 3 lb weight gain over night or 5 lb in a week) -Follow CHF Action Plan -Adhere to low sodium diet  -Go to see the physician regarding you abscess on your stomach         Our next appointment is by telephone on 05/14/23 at 11 am  Please call the care guide team at [phone] if you need to cancel or reschedule your appointment.   If you are experiencing a Mental Health or Behavioral Health Crisis or need someone to talk to, please call 1-800-273-TALK (toll free, 24 hour hotline)  Patient verbalizes understanding of instructions and care plan provided today and agrees to view in MyChart. Active MyChart status and patient understanding of how to access instructions and care plan via MyChart confirmed with patient.     [name] RN, BSN, [facility] Care Coordinator [facility]  Phone: [phone]

## 2023-04-13 NOTE — Patient Outreach (Signed)
  Care Coordination   Follow Up Visit Note   04/13/2023 Name: Alicia [name] MRN: [id number] DOB: [birth year]  Alicia [name] is a 67 y.o. year old female who sees Oswaldo Done, Marquita Palms, MD for primary care. I spoke with  Alicia [name] by phone today.  What matters to the patients health and wellness today?  Alicia [name] has an abscess on the left side of her stomach and has an appointment to be seen tomorrow. She currently weighs 214 lbs and has no issues with chest pain, swelling, or shortness of breath. She continues to take her medications as prescribed and wears her oxygen.    Goals Addressed             This Visit's Progress    I want to learn about my CHF and keep it under control       Patient Goals/Self Care Activities: -Patient/Caregiver will self-administer medications as prescribed as evidenced by self-report/primary caregiver report  -Patient/Caregiver will attend all scheduled provider appointments as evidenced by clinician review of documented attendance to scheduled appointments and patient/caregiver report -Patient/Caregiver will call pharmacy for medication refills as evidenced by patient report and review of pharmacy fill history as appropriate -Patient/Caregiver will call provider office for new concerns or questions as evidenced by review of documented incoming telephone call notes and patient report -Patient/Caregiver verbalizes understanding of plan -Patient/Caregiver will focus on medication adherence by taking medications as prescribed  -Weigh daily and record (notify MD with 3 lb weight gain over night or 5 lb in a week) -Follow CHF Action Plan -Adhere to low sodium diet  -Go to see the physician regarding you abscess on your stomach         SDOH assessments and interventions completed:  No     Care Coordination Interventions:  Yes, provided   Interventions Today    Flowsheet Row Most Recent Value  Chronic Disease   Chronic disease during  today's visit Congestive Heart Failure (CHF)  General Interventions   General Interventions Discussed/Reviewed General Interventions Reviewed  Pharmacy Interventions   Pharmacy Dicussed/Reviewed Pharmacy Topics Discussed        Follow up plan: Follow up call scheduled for 05/14/23  11 am    Encounter Outcome:  Pt. Visit Completed   [name] RN, BSN, [facility] Care Coordinator [facility]   Phone: [phone]

## 2023-04-14 ENCOUNTER — Ambulatory Visit (INDEPENDENT_AMBULATORY_CARE_PROVIDER_SITE_OTHER): Payer: 59 | Admitting: Student

## 2023-04-14 ENCOUNTER — Encounter: Payer: Self-pay | Admitting: Student

## 2023-04-14 ENCOUNTER — Other Ambulatory Visit: Payer: Self-pay

## 2023-04-14 VITALS — BP 134/67 | HR 67 | Temp 98.0°F | Ht 67.0 in | Wt 214.3 lb

## 2023-04-14 DIAGNOSIS — L98499 Non-pressure chronic ulcer of skin of other sites with unspecified severity: Secondary | ICD-10-CM | POA: Insufficient documentation

## 2023-04-14 DIAGNOSIS — L03311 Cellulitis of abdominal wall: Secondary | ICD-10-CM | POA: Diagnosis not present

## 2023-04-14 DIAGNOSIS — I10 Essential (primary) hypertension: Secondary | ICD-10-CM | POA: Diagnosis not present

## 2023-04-14 DIAGNOSIS — L0390 Cellulitis, unspecified: Secondary | ICD-10-CM | POA: Insufficient documentation

## 2023-04-14 MED ORDER — CEPHALEXIN 500 MG PO CAPS: 500.0000 mg | ORAL_CAPSULE | Freq: Two times a day (BID) | ORAL | 0 refills | Status: AC

## 2023-04-14 NOTE — Progress Notes (Signed)
CC: HTN follow up   HPI:  [name] is a 67 y.o. female with a past medical history of HFrecEF, hypertension, COPD, type 2 diabetes presenting for 2-week follow-up on hypertension.  Please see assessment and plan for full HPI.  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  Non-functioning   Aortic atherosclerosis (HCC) 09/24/2017   Asymptomatic, found on CT scan   Blood transfusion without reported diagnosis    Chronic Systolic Heart Failure 05/10/2014   Felt to be non-ischemic and secondary to hypertension.  Echo (05/28/2014): LVEF 25%.  Is not interested in AICD placement.   COPD exacerbation (HCC)    Coronary artery disease involving native coronary artery of native heart without angina pectoris 11/09/2014   Cardiac cath (02/18/2014): Non-obstructive, mRCA 30%, dRCA 60%   Cystocele with uterine prolapse - grade 3 02/12/2016   Not interested in pessary after trying   Diverticulosis of colon 09/24/2017   Diverticulosis of colon 09/24/2017   Essential hypertension 10/15/2013   Gastroesophageal reflux disease 09/24/2017   History of cerebrovascular accident 05/10/2013   Per patient report 6 previous strokes, most recent on 05/10/13.  No residual deficits.   History of cerebrovascular accident 05/10/2013   Per patient report 6 previous strokes, most recent on 05/10/13.  No residual deficits.   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 (HCC) 10/15/2013      Current Outpatient Medications:    cephALEXin (KEFLEX) 500 MG capsule, Take 1 capsule (500 mg total) by mouth 2 (two) times daily for 5 days., Disp: 10 capsule, Rfl: 0   ACCU-CHEK AVIVA PLUS test strip, USE TO TEST BLOOD SUGARS AS DIRECTED, Disp: 100 strip, Rfl: 12   albuterol (VENTOLIN HFA) 108 (90 Base) MCG/ACT inhaler, TAKE 2 PUFFS BY MOUTH EVERY 6 HOURS AS NEEDED, Disp: 18 each, Rfl: 5   amLODipine (NORVASC) 10 MG tablet, TAKE 1 TABLET BY MOUTH EVERY DAY, Disp: 90 tablet, Rfl: 3   aspirin 81 MG tablet, Take 81 mg by mouth daily., Disp: , Rfl:    atorvastatin (LIPITOR) 80 MG tablet, Take 1 tablet (80 mg total) by mouth daily., Disp: 90 tablet, Rfl: 3   B-D UF III MINI PEN NEEDLES 31G X 5 MM MISC, USE 3 TIMES A DAY WITH HUMALOG, Disp: 100 each, Rfl: 5   Dulaglutide (TRULICITY) 0.75 MG/0.5ML SOPN, Inject 0.75 mg into the skin once a week., Disp: 6 mL, Rfl: 1   ezetimibe (ZETIA) 10 MG tablet, Take 1 tablet (10 mg total) by mouth daily., Disp: 90 tablet, Rfl: 3   insulin glargine (LANTUS) 100 UNIT/ML injection, Inject 0.2 mLs (20 Units total) into the skin at bedtime., Disp: 10 mL, Rfl: 3   insulin lispro (HUMALOG) 100 UNIT/ML KwikPen, Inject 14 units before breakfast and lunch, inject 20 units before dinner, Disp: 15 mL, Rfl: 11   Insulin Pen Needle 32G X 4 MM MISC, Use to inject insulin 4 times a day. The patient  is insulin requiring, ICD 10 code 11.10. The patient injects 4 times per day., Disp: 400 each, Rfl: 3   Insulin Syringe-Needle U-100 31G X 15/64" 0.3 ML MISC, Use to inject Humalog before meals three times a day, Disp: 100 each, Rfl: 5   Lancets (ACCU-CHEK MULTICLIX) lancets, Use to check your blood sugar four times daily: early morning, before a meal, two hours after a meal, and bedtime, Disp: 100 each, Rfl: 12   mometasone-formoterol (DULERA) 100-5 MCG/ACT AERO, Inhale 2 puffs into the lungs 2 (two) times daily., Disp: 13 g, Rfl: 3   nystatin powder, Apply 1 Application topically  3 (three) times daily., Disp: 15 g, Rfl: 2   sacubitril-valsartan (ENTRESTO) 97-103 MG, Take 1 tablet by mouth 2 (two) times daily., Disp: 180 tablet, Rfl: 3   senna (SENOKOT) 8.6 MG TABS tablet, Take 2 tablets (17.2 mg total) by mouth daily., Disp: 120 tablet, Rfl: 2   spironolactone (ALDACTONE) 25 MG tablet, Take 1 tablet (25 mg total) by mouth daily., Disp: 90 tablet, Rfl: 3   tiotropium (SPIRIVA HANDIHALER) 18 MCG inhalation capsule, Place 1 capsule (18 mcg total) into inhaler and inhale daily., Disp: 30 capsule, Rfl: 2   torsemide (DEMADEX) 20 MG tablet, Take 3 tablets (60 mg total) by mouth daily., Disp: 90 tablet, Rfl: 0  Review of Systems:    Skin: Patient concerned for abscess  Physical Exam:  Vitals:   04/14/23 0840  BP: 134/67  Pulse: 67  Temp: 98 F (36.7 C)  TempSrc: Oral  SpO2: 92%  Weight: 214 lb 4.8 oz (97.2 kg)  Height: 5\' 7"  (1.702 m)    General: Patient is sitting comfortably in the room  Cardio: Regular rate and rhythm, no murmurs, rubs or gallops. 2+ pulses to bilateral upper and lower extremities  Pulmonary: Clear to ausculation bilaterally with no rales, rhonchi, and crackles  Abdomen: Left lower quadrant with 1.3 cm fluctuant abscess noted with tender to palpitation and warmth.  No obvious drainage appreciated.  Has not come to ahead yet.  Erythema spreads about 3 cm.  Assessment & Plan:   Cellulitis Patient presents with 3-day history of redness, tenderness, warmth noted to her left lower quadrant.  She states that she normally does not clean her abdomen prior to injecting her insulin.  She states this has happened in the past.  I counseled her on the importance of cleaning her abdominal wall before injecting which she is understanding about.  On exam, I do appreciate some fluctuance, and abscess growing come to ahead.  It is about 1 and half centimeters in length.  Erythema stretch is about 3 cm in length.  It is tender to touch.  It is warm.  No drainage  appreciated.  Plan: -Start cephalexin for the next 5 days -If not improved, and comes to ahead, we will have patient come back in 1 week to do I&D -Did inform patient if she develops fevers, chills, worsening redness up into her chest she will need to call the clinic back -Counseling given about importance of cleaning area before injecting -Warm compresses  Essential hypertension Patient presents for blood pressure recheck.  Patient current regimen includes Amlodipine 10 mg daily, spironolactone 25 mg daily, Entresto twice daily.  At last visit 2 weeks ago patient had elevated blood pressures into the 140s 150s systolic.  This could be due to acute concerns at previous visit.  At visit today, blood pressures at 134/67.  Patient did take her medications this morning.  She has no chest pain, shortness of breath, vision changes, or headaches this morning.  Plan: -Continue Amlodipine 10 mg daily, spironolactone 25 mg daily, Entresto twice daily -Patient to get blood pressure cuff at home -Keep log  Patient seen with Dr. Marquita Palms, DO PGY-1 Internal Medicine Resident  Pager: [phone]

## 2023-04-14 NOTE — Assessment & Plan Note (Signed)
Patient presents for blood pressure recheck.  Patient current regimen includes Amlodipine 10 mg daily, spironolactone 25 mg daily, Entresto twice daily.  At last visit 2 weeks ago patient had elevated blood pressures into the 140s 150s systolic.  This could be due to acute concerns at previous visit.  At visit today, blood pressures at 134/67.  Patient did take her medications this morning.  She has no chest pain, shortness of breath, vision changes, or headaches this morning.  Plan: -Continue Amlodipine 10 mg daily, spironolactone 25 mg daily, Entresto twice daily -Patient to get blood pressure cuff at home -Keep log

## 2023-04-14 NOTE — Assessment & Plan Note (Addendum)
Patient presents with 3-day history of redness, tenderness, warmth noted to her left lower quadrant.  She states that she normally does not clean her abdomen prior to injecting her insulin.  She states this has happened in the past.  I counseled her on the importance of cleaning her abdominal wall before injecting which she is understanding about.  On exam, I do appreciate some fluctuance, and abscess growing come to ahead.  It is about 1 and half centimeters in length.  Erythema stretch is about 3 cm in length.  It is tender to touch.  It is warm.  No drainage appreciated.  Plan: -Start cephalexin for the next 5 days -If not improved, and comes to ahead, we will have patient come back in 1 week to do I&D -Did inform patient if she develops fevers, chills, worsening redness up into her chest she will need to call the clinic back -Counseling given about importance of cleaning area before injecting -Warm compresses

## 2023-04-14 NOTE — Patient Instructions (Addendum)
Alicia [name] ZOXWRU,EAVWU you for allowing me to take part in your care today.  Here are your instructions.  1. Regarding your skin infection, I have sent in for some antibiotic. Please take this twice a day and make sure that you finish the course. If this does not improve please come back to me in one week.  2. Regarding your blood pressure please keep taking your Amlodipine 10 mg daily, spironolactone 25 mg daily, Entresto twice daily  3. If you do not come back in 1 week, you can return in about 3 months for a follow up   4. Place warm compresses on your area and see if this can start draining on its own.  Thank you, Dr. Allena Katz  If you have any other questions please contact the internal medicine clinic at [phone]

## 2023-04-17 NOTE — Progress Notes (Signed)
Internal Medicine Clinic Attending  I saw and evaluated the patient.  I personally confirmed the key portions of the history and exam documented by Dr. Allena Katz and I reviewed pertinent patient test results.  The assessment, diagnosis, and plan were formulated together and I agree with the documentation in the resident's note. Developing abscess is not mature enough to I&D.  Antiibotic may help treat surrounding cellulitis.

## 2023-04-19 DIAGNOSIS — J9611 Chronic respiratory failure with hypoxia: Secondary | ICD-10-CM | POA: Diagnosis not present

## 2023-04-26 ENCOUNTER — Encounter: Payer: Self-pay | Admitting: Cardiology

## 2023-04-26 ENCOUNTER — Ambulatory Visit: Payer: 59 | Attending: Cardiology | Admitting: Cardiology

## 2023-04-26 VITALS — BP 128/70 | HR 78 | Ht 67.0 in | Wt 213.0 lb

## 2023-04-26 DIAGNOSIS — I5032 Chronic diastolic (congestive) heart failure: Secondary | ICD-10-CM

## 2023-04-26 DIAGNOSIS — I2510 Atherosclerotic heart disease of native coronary artery without angina pectoris: Secondary | ICD-10-CM

## 2023-04-26 DIAGNOSIS — I700 Atherosclerosis of aorta: Secondary | ICD-10-CM

## 2023-04-26 NOTE — Patient Instructions (Signed)
Medication Instructions:  The current medical regimen is effective;  continue present plan and medications.  *If you need a refill on your cardiac medications before your next appointment, please call your pharmacy*  Follow-Up: At [facility], you and your health needs are our priority.  As part of our continuing mission to provide you with exceptional heart care, we have created designated Provider Care Teams.  These Care Teams include your primary Cardiologist (physician) and Advanced Practice Providers (APPs -  Physician Assistants and Nurse Practitioners) who all work together to provide you with the care you need, when you need it.  We recommend signing up for the patient portal called "MyChart".  Sign up information is provided on this After Visit Summary.  MyChart is used to connect with patients for Virtual Visits (Telemedicine).  Patients are able to view lab/test results, encounter notes, upcoming appointments, etc.  Non-urgent messages can be sent to your provider as well.   To learn more about what you can do with MyChart, go to [email or web address].    Your next appointment:   6 month(s)  Provider:   Scott Weaver, PA-C     Then, Mark Skains, MD will plan to see you again in 1 year(s).     
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
## 2023-04-30 ENCOUNTER — Other Ambulatory Visit: Payer: Self-pay | Admitting: Student in an Organized Health Care Education/Training Program

## 2023-04-30 ENCOUNTER — Other Ambulatory Visit: Payer: Self-pay | Admitting: Student

## 2023-04-30 DIAGNOSIS — E113393 Type 2 diabetes mellitus with moderate nonproliferative diabetic retinopathy without macular edema, bilateral: Secondary | ICD-10-CM

## 2023-05-03 ENCOUNTER — Ambulatory Visit (INDEPENDENT_AMBULATORY_CARE_PROVIDER_SITE_OTHER): Payer: 59 | Admitting: Student in an Organized Health Care Education/Training Program

## 2023-05-03 ENCOUNTER — Encounter: Payer: Self-pay | Admitting: Student in an Organized Health Care Education/Training Program

## 2023-05-03 VITALS — BP 134/63 | HR 76 | Temp 98.2°F | Ht 67.0 in | Wt 216.0 lb

## 2023-05-03 DIAGNOSIS — I10 Essential (primary) hypertension: Secondary | ICD-10-CM | POA: Diagnosis not present

## 2023-05-03 DIAGNOSIS — Z794 Long term (current) use of insulin: Secondary | ICD-10-CM

## 2023-05-03 DIAGNOSIS — J439 Emphysema, unspecified: Secondary | ICD-10-CM

## 2023-05-03 DIAGNOSIS — L98491 Non-pressure chronic ulcer of skin of other sites limited to breakdown of skin: Secondary | ICD-10-CM | POA: Diagnosis not present

## 2023-05-03 DIAGNOSIS — Z7984 Long term (current) use of oral hypoglycemic drugs: Secondary | ICD-10-CM | POA: Diagnosis not present

## 2023-05-03 DIAGNOSIS — E113393 Type 2 diabetes mellitus with moderate nonproliferative diabetic retinopathy without macular edema, bilateral: Secondary | ICD-10-CM | POA: Diagnosis not present

## 2023-05-03 DIAGNOSIS — Z87891 Personal history of nicotine dependence: Secondary | ICD-10-CM | POA: Diagnosis not present

## 2023-05-03 LAB — POCT GLYCOSYLATED HEMOGLOBIN (HGB A1C): Hemoglobin A1C: 8.2 % — AB (ref 4.0–5.6)

## 2023-05-03 LAB — GLUCOSE, CAPILLARY: Glucose-Capillary: 157 mg/dL — ABNORMAL HIGH (ref 70–99)

## 2023-05-03 MED ORDER — DAPAGLIFLOZIN PROPANEDIOL 5 MG PO TABS
5.0000 mg | ORAL_TABLET | Freq: Every day | ORAL | 3 refills | Status: DC
Start: 2023-05-03 — End: 2023-05-26

## 2023-05-03 MED ORDER — INSULIN GLARGINE 100 UNIT/ML SOLOSTAR PEN
20.0000 [IU] | PEN_INJECTOR | Freq: Every day | SUBCUTANEOUS | 3 refills | Status: DC
Start: 2023-05-03 — End: 2023-06-28

## 2023-05-03 MED ORDER — TORSEMIDE 20 MG PO TABS: 60.0000 mg | ORAL_TABLET | Freq: Every day | ORAL | 2 refills | Status: DC

## 2023-05-03 MED ORDER — TRULICITY 1.5 MG/0.5ML ~~LOC~~ SOAJ
1.5000 mg | SUBCUTANEOUS | 5 refills | Status: DC
Start: 2023-05-03 — End: 2023-10-16

## 2023-05-03 NOTE — Patient Instructions (Signed)

## 2023-05-03 NOTE — Assessment & Plan Note (Signed)
Chronic and stable.  Doing well on 2 L supplemental oxygen with activity.  No recent exacerbations.  Having some seasonal allergies which we can treat with cetirizine.  Will continue with Dulera 2 times daily and Spiriva once daily.

## 2023-05-03 NOTE — Assessment & Plan Note (Signed)
Last month she had an abdominal wall abscess that was treated with antibiotics, it had spontaneous drainage and has left a 4x2 cm ulcer. Delayed wound healing likely due to diabetes, obesity, and the use of subcutaneous insulin. I don't see any signs of active cellulitis. The wound looks healthy, there is no eschar or necrotic tissue, there is no purulence. The borders appear healthy. I anticipate this will take 4-6 weeks to heal. I recommended referral to the wound care center, patient declines. We talked about clean daily dressing changes at home. No need for debridement therapy at this time, I recommended against topical creams or ointments. Will need good glycemic control. Follow up with me in 2 weeks for wound check.

## 2023-05-03 NOTE — Assessment & Plan Note (Addendum)
Hemoglobin A1c is improved to 8.2%, just slightly above our goal of 8. We clarified some confusion about her Lantus, pharmacy had dispensed vial which she was not able to use, prefers pens only. No glucometer readings to review today.  - Lantus 20 units daily - Humalog 14 units breakfast, 20 units dinner - Dulaglutide increase to 1.5mg  weekly. - Restart Farxiga 5mg  daily, was held for a UTI in March

## 2023-05-03 NOTE — Assessment & Plan Note (Signed)
Blood pressure is well-controlled today.  Some inconsistent refill history of Entresto which we will continue.  Also continue spironolactone and amlodipine.

## 2023-05-03 NOTE — Progress Notes (Signed)
Established Patient Office Visit  Subjective   Patient ID: Alicia [name], female    DOB: [birth year]  Age: 67 y.o. MRN: [id number]  Chief Complaint  Patient presents with   Headache   Diabetes    HPI  67 year old person here for follow-up of hypertension and diabetes.  Has an acute concern today of a wound on her abdomen.  Had cellulitis that was treated with antibiotics about 1 month ago.  Had spontaneous drainage and then a ulcer open on the abdomen.  She has not been injecting insulin in the area recently.  Otherwise feels well after finishing antibiotics, no fever or chills.  Tenderness is much improved.  He has been keeping a clean bandage on it almost every day.  Eating and drinking well.  Respiratory status is stable on 2 L supplemental oxygen.  No chest pain.  Can walk around her house without having to stop, cannot walk greater than 100 yards.  Uses a cane to ambulate.  No recent falls.  Good adherence with her medications, no other side effects.    Objective:     BP 134/63 (BP Location: Right Arm, Patient Position: Sitting, Cuff Size: Small)   Pulse 76   Temp 98.2 F (36.8 C) (Oral)   Ht 5\' 7"  (1.702 m)   Wt 216 lb (98 kg)   LMP  (LMP Unknown)   SpO2 94% Comment: on 4 liters  BMI 33.83 kg/m    Physical Exam  Gen: Well-appearing woman, no distress Skin: There is a 4 x 2 cm ulceration on the left mid abdomen with some surrounding induration but no erythema.  There is no necrotic tissue, no eschar, no purulent drainage.  Underlying dermis appears healthy.  CV: Regular rate and rhythm with no murmurs Lungs: Unlabored clear throughout with no crackles or wheezing Ext: Warm and well perfused, no edema.      Assessment & Plan:   Problem List Items Addressed This Visit       High   Type 2 diabetes mellitus with moderate nonproliferative diabetic retinopathy (HCC) - Primary (Chronic)    Hemoglobin A1c is improved to 8.2%, just slightly above our goal of 8. We  clarified some confusion about her Lantus, pharmacy had dispensed vial which she was not able to use, prefers pens only. No glucometer readings to review today.  - Lantus 20 units daily - Humalog 14 units breakfast, 20 units dinner - Dulaglutide increase to 1.5mg  weekly. - Restart Farxiga 5mg  daily, was held for a UTI in March      Relevant Medications   insulin glargine (LANTUS) 100 UNIT/ML Solostar Pen   Dulaglutide (TRULICITY) 1.5 MG/0.5ML SOPN   dapagliflozin propanediol (FARXIGA) 5 MG TABS tablet   Other Relevant Orders   POC Hbg A1C (Completed)   Essential hypertension (Chronic)    Blood pressure is well-controlled today.  Some inconsistent refill history of Entresto which we will continue.  Also continue spironolactone and amlodipine.      Relevant Medications   torsemide (DEMADEX) 20 MG tablet   COPD (chronic obstructive pulmonary disease) (HCC) (Chronic)    Chronic and stable.  Doing well on 2 L supplemental oxygen with activity.  No recent exacerbations.  Having some seasonal allergies which we can treat with cetirizine.  Will continue with Dulera 2 times daily and Spiriva once daily.        Unprioritized   Abdominal wall skin ulcer (HCC)    Last month she had an abdominal wall abscess  that was treated with antibiotics, it had spontaneous drainage and has left a 4x2 cm ulcer. Delayed wound healing likely due to diabetes, obesity, and the use of subcutaneous insulin. I don't see any signs of active cellulitis. The wound looks healthy, there is no eschar or necrotic tissue, there is no purulence. The borders appear healthy. I anticipate this will take 4-6 weeks to heal. I recommended referral to the wound care center, patient declines. We talked about clean daily dressing changes at home. No need for debridement therapy at this time, I recommended against topical creams or ointments. Will need good glycemic control. Follow up with me in 2 weeks for wound check.        Return in  2 weeks (on 05/17/2023).    [name], MD

## 2023-05-04 ENCOUNTER — Telehealth: Payer: Self-pay | Admitting: *Deleted

## 2023-05-04 NOTE — Progress Notes (Signed)
  Care Coordination Note  05/04/2023 Name: Alicia [name] MRN: [id number] DOB: 01-21-1956  Alicia [name] is a 67 y.o. year old female who is a primary care patient of [name], MD and is actively engaged with the care management team. I reached out to Poonam Woehrle Lumsden by phone today to assist with re-scheduling a follow up visit with the RN Case Manager  Follow up plan: Unsuccessful telephone outreach attempt made. A HIPAA compliant phone message was left for the patient providing contact information and requesting a return call.   [facility]  Care Coordination Care Guide  Direct Dial: [phone]

## 2023-05-06 ENCOUNTER — Encounter (INDEPENDENT_AMBULATORY_CARE_PROVIDER_SITE_OTHER): Payer: 59 | Admitting: Ophthalmology

## 2023-05-10 ENCOUNTER — Encounter: Payer: Self-pay | Admitting: *Deleted

## 2023-05-10 NOTE — Progress Notes (Signed)
[facility] Quality Team Note  Name: Jacquelin Kollmar Pica Date of Birth: 04/11/1956 MRN: [id number] Date: 05/10/2023  [facility] Quality Team has reviewed this patient's chart, please see recommendations below:  [facility] Quality Other; Pt has open gaps for mammogram and colon screening.  Pt has ov 05/17/2023.  Would provider be able to address at ov?

## 2023-05-13 ENCOUNTER — Encounter (INDEPENDENT_AMBULATORY_CARE_PROVIDER_SITE_OTHER): Payer: 59 | Admitting: Ophthalmology

## 2023-05-13 DIAGNOSIS — H43813 Vitreous degeneration, bilateral: Secondary | ICD-10-CM | POA: Diagnosis not present

## 2023-05-13 DIAGNOSIS — E113312 Type 2 diabetes mellitus with moderate nonproliferative diabetic retinopathy with macular edema, left eye: Secondary | ICD-10-CM | POA: Diagnosis not present

## 2023-05-13 DIAGNOSIS — H34832 Tributary (branch) retinal vein occlusion, left eye, with macular edema: Secondary | ICD-10-CM | POA: Diagnosis not present

## 2023-05-13 DIAGNOSIS — H4421 Degenerative myopia, right eye: Secondary | ICD-10-CM | POA: Diagnosis not present

## 2023-05-13 DIAGNOSIS — H35033 Hypertensive retinopathy, bilateral: Secondary | ICD-10-CM | POA: Diagnosis not present

## 2023-05-17 ENCOUNTER — Encounter: Payer: Self-pay | Admitting: Student in an Organized Health Care Education/Training Program

## 2023-05-17 ENCOUNTER — Ambulatory Visit (INDEPENDENT_AMBULATORY_CARE_PROVIDER_SITE_OTHER): Payer: 59 | Admitting: Student in an Organized Health Care Education/Training Program

## 2023-05-17 VITALS — BP 133/58 | HR 66 | Temp 98.2°F | Ht 67.0 in | Wt 219.8 lb

## 2023-05-17 DIAGNOSIS — L98491 Non-pressure chronic ulcer of skin of other sites limited to breakdown of skin: Secondary | ICD-10-CM | POA: Diagnosis not present

## 2023-05-17 DIAGNOSIS — E113393 Type 2 diabetes mellitus with moderate nonproliferative diabetic retinopathy without macular edema, bilateral: Secondary | ICD-10-CM

## 2023-05-17 DIAGNOSIS — Z794 Long term (current) use of insulin: Secondary | ICD-10-CM

## 2023-05-17 MED ORDER — MEDIHONEY WOUND/BURN DRESSING EX PSTE: 1.0000 | PASTE | Freq: Every day | CUTANEOUS | 0 refills | Status: DC

## 2023-05-17 NOTE — Progress Notes (Addendum)
   Established Patient Office Visit  Subjective   Patient ID: Alicia [name], female    DOB: [birth year]  Age: 67 y.o. MRN: [id number]  Chief Complaint  Patient presents with   Follow-up    HPI  67 year old person living with diabetes and hypertension here for follow-up of a nonhealing wound on her left abdomen.  This happened after an abscess on her abdominal wall, probably related to insulin injections.  She is doing well at home, no systemic symptoms, no fevers or chills.  She is doing a dry clean gauze dressing on the wound every day.  Is having no pain or discomfort at the area.  Overall she feels like it is doing better.    Objective:     BP (!) 133/58 (BP Location: Left Arm, Patient Position: Sitting, Cuff Size: Small)   Pulse 66   Temp 98.2 F (36.8 C) (Oral)   Ht 5\' 7"  (1.702 m)   Wt 219 lb 12.8 oz (99.7 kg)   LMP  (LMP Unknown)   BMI 34.43 kg/m    Physical Exam  Gen: Well-appearing woman, no distress Skin: She had a 3 x 2 cm wound on her left mid abdomen with an overlying eschar that was dry.  I did manual removal of the eschar using a scalpel and was able to see fresh bleeding tissue.  The underlying ulcer was only 2 x 1 cm in size.  There is no drainage, no surrounding erythema.      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  There is some confusion about the Lantus, dispensing pens versus vials.  I called her pharmacy, they tell me that they dispense 2 boxes of Lantus on May 31 and that she would not be able to refill until at least mid July.  She is going to go home and look for these boxes but not sure that this was dispensed or she may have returned?  I reaffirmed to her that I do  not want her to go without the Lantus, so please call me back if this dispute cannot be resolved.        Unprioritized   Abdominal wall skin ulcer (HCC) - Primary    Abdominal ulcer on the left abdomen is healing slowly.  It measures 2 x 1 cm today.  I debrided an eschar from on top of the ulcer down to fresh bleeding tissue.  Will prescribe Medihoney to help prevent eschar formation.  Continue clean dressings every day.  Follow-up with me in 1 week for wound recheck.  No signs of skin or soft tissue infection today.  No role for antibiotics.  She will continue rotating insulin injection sites to prevent future wounds.       Return in about 1 week (around 05/24/2023) for wound check.    [name], MD

## 2023-05-17 NOTE — Assessment & Plan Note (Signed)
Patient reports adherence with Lantus 20 units daily, Humalog 14 units before breakfast and lunch and 20 units before dinner, and Trulicity 1.5 mg weekly.  There is some confusion about the Lantus, dispensing pens versus vials.  I called her pharmacy, they tell me that they dispense 2 boxes of Lantus on May 31 and that she would not be able to refill until at least mid July.  She is going to go home and look for these boxes but not sure that this was dispensed or she may have returned?  I reaffirmed to her that I do not want her to go without the Lantus, so please call me back if this dispute cannot be resolved.

## 2023-05-17 NOTE — Progress Notes (Signed)
  Care Coordination Note  05/17/2023 Name: Alicia [name] MRN: [id number] DOB: [birth year]  Alicia [name] is a 67 y.o. year old female who is a primary care patient of [name], MD and is actively engaged with the care management team. I reached out to Vincenzina Jagoda Rochford by phone today to assist with re-scheduling a follow up visit with the RN Case Manager  Follow up plan: Telephone appointment with care management team member scheduled for:05/26/23  [facility] Coordination Care Guide  Direct Dial: [phone]

## 2023-05-17 NOTE — Assessment & Plan Note (Signed)
Abdominal ulcer on the left abdomen is healing slowly.  It measures 2 x 1 cm today.  I debrided an eschar from on top of the ulcer down to fresh bleeding tissue.  Will prescribe Medihoney to help prevent eschar formation.  Continue clean dressings every day.  Follow-up with me in 1 week for wound recheck.  No signs of skin or soft tissue infection today.  No role for antibiotics.  She will continue rotating insulin injection sites to prevent future wounds.

## 2023-05-18 ENCOUNTER — Other Ambulatory Visit: Payer: Self-pay | Admitting: Student in an Organized Health Care Education/Training Program

## 2023-05-18 ENCOUNTER — Telehealth: Payer: Self-pay

## 2023-05-18 NOTE — Telephone Encounter (Signed)
FYI patient called regarding rx for leptospermum manuka honey (MEDIHONEY) PSTE paste  she stated the medication is out of stock at the pharmacy, the pharmacy should be receiving the medication on Thursday.

## 2023-05-18 NOTE — Telephone Encounter (Signed)
Thanks for letting me know. That is fine.

## 2023-05-19 ENCOUNTER — Ambulatory Visit (INDEPENDENT_AMBULATORY_CARE_PROVIDER_SITE_OTHER): Payer: 59

## 2023-05-19 VITALS — Ht 67.0 in | Wt 219.0 lb

## 2023-05-19 DIAGNOSIS — Z0000 Encounter for general adult medical examination without abnormal findings: Secondary | ICD-10-CM | POA: Diagnosis not present

## 2023-05-19 DIAGNOSIS — Z1231 Encounter for screening mammogram for malignant neoplasm of breast: Secondary | ICD-10-CM

## 2023-05-19 NOTE — Addendum Note (Signed)
Addended by: Sue Lush on: 05/19/2023 10:15 AM   Modules accepted: Orders

## 2023-05-19 NOTE — Patient Instructions (Signed)
Alicia [name] , Thank you for taking time to come for your Medicare Wellness Visit. I appreciate your ongoing commitment to your health goals. Please review the following plan we discussed and let me know if I can assist you in the future.   These are the goals we discussed:  Goals      Blood Pressure < 140/90     HEMOGLOBIN A1C < 7.0     I want to learn about my CHF and keep it under control     Patient Goals/Self Care Activities: -Patient/Caregiver will self-administer medications as prescribed as evidenced by self-report/primary caregiver report  -Patient/Caregiver will attend all scheduled provider appointments as evidenced by clinician review of documented attendance to scheduled appointments and patient/caregiver report -Patient/Caregiver will call pharmacy for medication refills as evidenced by patient report and review of pharmacy fill history as appropriate -Patient/Caregiver will call provider office for new concerns or questions as evidenced by review of documented incoming telephone call notes and patient report -Patient/Caregiver verbalizes understanding of plan -Patient/Caregiver will focus on medication adherence by taking medications as prescribed  -Weigh daily and record (notify MD with 3 lb weight gain over night or 5 lb in a week) -Follow CHF Action Plan -Adhere to low sodium diet  -Go to see the physician regarding you abscess on your stomach      Increase physical activity     Begin seated and standing exercises with exercise band to increase strength and balance.      Weight (lb) < 170 lb (77.1 kg)        This is a list of the screening recommended for you and due dates:  Health Maintenance  Topic Date Due   Zoster (Shingles) Vaccine (1 of 2) Never done   Eye exam for diabetics  09/22/2022   DEXA scan (bone density measurement)  Never done   Yearly kidney health urinalysis for diabetes  05/12/2023   Mammogram  05/28/2023   Flu Shot  06/24/2023   Hemoglobin  A1C  08/03/2023   Pap Smear  01/21/2024   Complete foot exam   02/01/2024   Screening for Lung Cancer  03/15/2024   Yearly kidney function blood test for diabetes  03/21/2024   Medicare Annual Wellness Visit  05/18/2024   Pneumonia Vaccine (3 of 3 - PPSV23 or PCV20) 05/28/2024   DTaP/Tdap/Td vaccine (2 - Td or Tdap) 09/25/2027   Hepatitis C Screening  Completed   HIV Screening  Completed   HPV Vaccine  Aged Out   COVID-19 Vaccine  Discontinued    Advanced directives: no  Conditions/risks identified: low falls risk  Next appointment: Follow up in one year for your annual wellness visit to be scheduled by team   Preventive Care 65 Years and Older, Female Preventive care refers to lifestyle choices and visits with your health care provider that can promote health and wellness. What does preventive care include? A yearly physical exam. This is also called an annual well check. Dental exams once or twice a year. Routine eye exams. Ask your health care provider how often you should have your eyes checked. Personal lifestyle choices, including: Daily care of your teeth and gums. Regular physical activity. Eating a healthy diet. Avoiding tobacco and drug use. Limiting alcohol use. Practicing safe sex. Taking low-dose aspirin every day. Taking vitamin and mineral supplements as recommended by your health care provider. What happens during an annual well check? The services and screenings done by your health care provider during your  annual well check will depend on your age, overall health, lifestyle risk factors, and family history of disease. Counseling  Your health care provider may ask you questions about your: Alcohol use. Tobacco use. Drug use. Emotional well-being. Home and relationship well-being. Sexual activity. Eating habits. History of falls. Memory and ability to understand (cognition). Work and work Astronomer. Reproductive health. Screening  You may have the  following tests or measurements: Height, weight, and BMI. Blood pressure. Lipid and cholesterol levels. These may be checked every 5 years, or more frequently if you are over 35 years old. Skin check. Lung cancer screening. You may have this screening every year starting at age 76 if you have a 30-pack-year history of smoking and currently smoke or have quit within the past 15 years. Fecal occult blood test (FOBT) of the stool. You may have this test every year starting at age 24. Flexible sigmoidoscopy or colonoscopy. You may have a sigmoidoscopy every 5 years or a colonoscopy every 10 years starting at age 36. Hepatitis C blood test. Hepatitis B blood test. Sexually transmitted disease (STD) testing. Diabetes screening. This is done by checking your blood sugar (glucose) after you have not eaten for a while (fasting). You may have this done every 1-3 years. Bone density scan. This is done to screen for osteoporosis. You may have this done starting at age 37. Mammogram. This may be done every 1-2 years. Talk to your health care provider about how often you should have regular mammograms. Talk with your health care provider about your test results, treatment options, and if necessary, the need for more tests. Vaccines  Your health care provider may recommend certain vaccines, such as: Influenza vaccine. This is recommended every year. Tetanus, diphtheria, and acellular pertussis (Tdap, Td) vaccine. You may need a Td booster every 10 years. Zoster vaccine. You may need this after age 9. Pneumococcal 13-valent conjugate (PCV13) vaccine. One dose is recommended after age 21. Pneumococcal polysaccharide (PPSV23) vaccine. One dose is recommended after age 57. Talk to your health care provider about which screenings and vaccines you need and how often you need them. This information is not intended to replace advice given to you by your health care provider. Make sure you discuss any questions you  have with your health care provider. Document Released: 12/06/2015 Document Revised: 07/29/2016 Document Reviewed: 09/10/2015 Elsevier Interactive Patient Education  2017 [employer].  Fall Prevention in the Home Falls can cause injuries. They can happen to people of all ages. There are many things you can do to make your home safe and to help prevent falls. What can I do on the outside of my home? Regularly fix the edges of walkways and driveways and fix any cracks. Remove anything that might make you trip as you walk through a door, such as a raised step or threshold. Trim any bushes or trees on the path to your home. Use bright outdoor lighting. Clear any walking paths of anything that might make someone trip, such as rocks or tools. Regularly check to see if handrails are loose or broken. Make sure that both sides of any steps have handrails. Any raised decks and porches should have guardrails on the edges. Have any leaves, snow, or ice cleared regularly. Use sand or salt on walking paths during winter. Clean up any spills in your garage right away. This includes oil or grease spills. What can I do in the bathroom? Use night lights. Install grab bars by the toilet and in  the tub and shower. Do not use towel bars as grab bars. Use non-skid mats or decals in the tub or shower. If you need to sit down in the shower, use a plastic, non-slip stool. Keep the floor dry. Clean up any water that spills on the floor as soon as it happens. Remove soap buildup in the tub or shower regularly. Attach bath mats securely with double-sided non-slip rug tape. Do not have throw rugs and other things on the floor that can make you trip. What can I do in the bedroom? Use night lights. Make sure that you have a light by your bed that is easy to reach. Do not use any sheets or blankets that are too big for your bed. They should not hang down onto the floor. Have a firm chair that has side arms. You can  use this for support while you get dressed. Do not have throw rugs and other things on the floor that can make you trip. What can I do in the kitchen? Clean up any spills right away. Avoid walking on wet floors. Keep items that you use a lot in easy-to-reach places. If you need to reach something above you, use a strong step stool that has a grab bar. Keep electrical cords out of the way. Do not use floor polish or wax that makes floors slippery. If you must use wax, use non-skid floor wax. Do not have throw rugs and other things on the floor that can make you trip. What can I do with my stairs? Do not leave any items on the stairs. Make sure that there are handrails on both sides of the stairs and use them. Fix handrails that are broken or loose. Make sure that handrails are as long as the stairways. Check any carpeting to make sure that it is firmly attached to the stairs. Fix any carpet that is loose or worn. Avoid having throw rugs at the top or bottom of the stairs. If you do have throw rugs, attach them to the floor with carpet tape. Make sure that you have a light switch at the top of the stairs and the bottom of the stairs. If you do not have them, ask someone to add them for you. What else can I do to help prevent falls? Wear shoes that: Do not have high heels. Have rubber bottoms. Are comfortable and fit you well. Are closed at the toe. Do not wear sandals. If you use a stepladder: Make sure that it is fully opened. Do not climb a closed stepladder. Make sure that both sides of the stepladder are locked into place. Ask someone to hold it for you, if possible. Clearly mark and make sure that you can see: Any grab bars or handrails. First and last steps. Where the edge of each step is. Use tools that help you move around (mobility aids) if they are needed. These include: Canes. Walkers. Scooters. Crutches. Turn on the lights when you go into a dark area. Replace any light  bulbs as soon as they burn out. Set up your furniture so you have a clear path. Avoid moving your furniture around. If any of your floors are uneven, fix them. If there are any pets around you, be aware of where they are. Review your medicines with your doctor. Some medicines can make you feel dizzy. This can increase your chance of falling. Ask your doctor what other things that you can do to help prevent falls. This information  is not intended to replace advice given to you by your health care provider. Make sure you discuss any questions you have with your health care provider. Document Released: 09/05/2009 Document Revised: 04/16/2016 Document Reviewed: 12/14/2014 Elsevier Interactive Patient Education  2017 [employer].
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
## 2023-05-20 ENCOUNTER — Telehealth: Payer: Self-pay | Admitting: *Deleted

## 2023-05-20 DIAGNOSIS — J9611 Chronic respiratory failure with hypoxia: Secondary | ICD-10-CM | POA: Diagnosis not present

## 2023-05-20 NOTE — Telephone Encounter (Signed)
Ok. No need to replace it, I am sorry it was hard to get. I will check the wound again next week and we can talk then if we need to look elsewhere for it.

## 2023-05-20 NOTE — Progress Notes (Signed)
I reviewed the AWV findings with the provider who conducted the visit. I was present in the office suite and immediately available to provide assistance and direction throughout the time the service was provided.  

## 2023-05-20 NOTE — Telephone Encounter (Signed)
Call to [facility].  They have another brand of the MediHoney.   Patient was given information and was going to check it out.

## 2023-05-25 ENCOUNTER — Encounter: Payer: Self-pay | Admitting: Student

## 2023-05-25 ENCOUNTER — Other Ambulatory Visit: Payer: Self-pay

## 2023-05-25 ENCOUNTER — Ambulatory Visit (INDEPENDENT_AMBULATORY_CARE_PROVIDER_SITE_OTHER): Payer: 59 | Admitting: Student

## 2023-05-25 VITALS — BP 137/85 | HR 64 | Temp 97.9°F | Ht 67.0 in | Wt 216.9 lb

## 2023-05-25 DIAGNOSIS — E113393 Type 2 diabetes mellitus with moderate nonproliferative diabetic retinopathy without macular edema, bilateral: Secondary | ICD-10-CM

## 2023-05-25 DIAGNOSIS — N898 Other specified noninflammatory disorders of vagina: Secondary | ICD-10-CM

## 2023-05-25 DIAGNOSIS — Z794 Long term (current) use of insulin: Secondary | ICD-10-CM | POA: Diagnosis not present

## 2023-05-25 DIAGNOSIS — H6121 Impacted cerumen, right ear: Secondary | ICD-10-CM | POA: Diagnosis not present

## 2023-05-25 DIAGNOSIS — L98491 Non-pressure chronic ulcer of skin of other sites limited to breakdown of skin: Secondary | ICD-10-CM

## 2023-05-25 NOTE — Patient Instructions (Signed)
This after visit summary is an important review of tests, referrals, and medication changes that were discussed during your visit. If you have questions or concerns, call [phone]. Outside of clinic business hours, call the main hospital at [phone] and ask the operator for the on-call internal medicine resident.   Ernesta Amble MD 05/25/2023, 10:47 AM

## 2023-05-25 NOTE — Progress Notes (Unsigned)
    Subjective:  Alicia [name] is a 67 y.o. who presents to clinic for the following:  Follow-up (ROUTINE OFFICE VISIT FOR WOUND CHECK ON ABD/)  Wound on abdominal wall. Improving rapidly with the [facility]. Very pleased with how it's healing.  Recently she has had some vaginal itching and increased frequency of urination. No dysuria or hematuria. No apparent vaginal discharge.  She has a stuffy left hear. She salivates at night and since she sleeps on her back she wonders if debris has gotten stuck in her ear. No pain or drainage.  Objective:   Vitals:   05/25/23 0921  BP: 137/85  Pulse: 64  Temp: 97.9 F (36.6 C)  TempSrc: Oral  SpO2: 99%  Weight: 216 lb 14.4 oz (98.4 kg)  Height: 5\' 7"  (1.702 m)    Physical Exam Well-appearing female sitting in wheelchair Right ear notable for occluded canal, pearly white debris impacted in the canal, left ear with visible tympanic membrane, normal Heart rate is normal, rhythm is regular, radial pulses are strong, no lower extremity edema, no apparent JVD Breathing is regular and unlabored on low-flow oxygen via nasal cannula, no wheezing or crackles Ulcerated lesion on abdominal wall with healthy appearing granulation tissue, smaller than at last visit Alert and oriented Pleasant, concordant affect  Assessment & Plan:   Abdominal wall skin ulcer (HCC) Healing well, Medihoney seems to be helping.  She is keeping it clean and bandaged.  Picture in chart.  Type 2 diabetes mellitus with moderate nonproliferative diabetic retinopathy (HCC) No acute concerns today.  She has a good handle on her insulin regimen and reports that she has enough Lantus to last her until her next refill is due.  Urine microalbumin today.  Discontinuing [name] as she was instructed to stop taking this medicine during last hospitalization has been having frequent yeast infections.  Candidal vulvovaginitis Reports some itching and increased urinary frequency.   No pain or dysuria, did not really sound like UTI, UA confirms this.  Deferred genitourinary exam per her request.  Will treat empirically for vaginal yeast infection, which she has frequently.  She has lots of Monistat at home, which she will try.  I sent a one-time dose of fluconazole to her pharmacy in case her symptoms don't abate.  She says that she is not taking her [name], as this was discontinued at her last hospitalization, but interestingly this medication has been filled for the last year or so.  Will discontinue this medicine from her list.  Hearing loss of right ear due to cerumen impaction Exam shows impacted debris, does not look like cerumen, might actually be foodstuffs from her nighttime salivation.  Appreciate our nursing staff for help cleaning this area today.  Got a lot of it out, some still remains.  I recommended Debrox drops which she has at home, can also try mineral oil.  If the ear does not clear by the end of the week she should return for repeat cleaning, do not think we need ENT at this point.    Return if symptoms worsen or fail to improve, or in 3 months for routine visit.  Patient discussed with Dr. Michel Bickers MD 05/26/2023, 1:39 PM  [phone]

## 2023-05-26 ENCOUNTER — Ambulatory Visit: Payer: Self-pay

## 2023-05-26 DIAGNOSIS — B3731 Acute candidiasis of vulva and vagina: Secondary | ICD-10-CM | POA: Insufficient documentation

## 2023-05-26 LAB — MICROALBUMIN / CREATININE URINE RATIO
Creatinine, Urine: 46.7 mg/dL
Microalb/Creat Ratio: 3312 mg/g creat — ABNORMAL HIGH (ref 0–29)
Microalbumin, Urine: 1546.8 ug/mL

## 2023-05-26 LAB — URINALYSIS, ROUTINE W REFLEX MICROSCOPIC
Bilirubin, UA: NEGATIVE
Glucose, UA: NEGATIVE
Ketones, UA: NEGATIVE
Leukocytes,UA: NEGATIVE
Nitrite, UA: NEGATIVE
RBC, UA: NEGATIVE
Specific Gravity, UA: 1.01 (ref 1.005–1.030)
Urobilinogen, Ur: 0.2 mg/dL (ref 0.2–1.0)
pH, UA: 6 (ref 5.0–7.5)

## 2023-05-26 LAB — MICROSCOPIC EXAMINATION
Casts: NONE SEEN /lpf
Epithelial Cells (non renal): >10 /hpf — AB (ref 0–10)
RBC, Urine: NONE SEEN /hpf (ref 0–2)

## 2023-05-26 MED ORDER — FLUCONAZOLE 150 MG PO TABS
150.0000 mg | ORAL_TABLET | Freq: Every day | ORAL | 0 refills | Status: DC
Start: 2023-05-26 — End: 2023-06-16

## 2023-05-26 NOTE — Assessment & Plan Note (Addendum)
Reports some itching and increased urinary frequency.  No pain or dysuria, did not really sound like UTI, UA confirms this.  Deferred genitourinary exam per her request.  Will treat empirically for vaginal yeast infection, which she has frequently.  She has lots of Monistat at home, which she will try.  I sent a one-time dose of fluconazole to her pharmacy in case her symptoms don't abate.  She says that she is not taking her [name], as this was discontinued at her last hospitalization, but interestingly this medication has been filled for the last year or so.  Will discontinue this medicine from her list.

## 2023-05-26 NOTE — Assessment & Plan Note (Signed)
Healing well, Medihoney seems to be helping.  She is keeping it clean and bandaged.  Picture in chart.

## 2023-05-26 NOTE — Patient Outreach (Signed)
  Care Coordination   Follow Up Visit Note   05/26/2023 Name: Alicia [name] MRN: [id number] DOB: 09/17/1956  Alicia [name] is a 67 y.o. year old female who sees Alicia [name], Alicia Palms, MD for primary care. I spoke with  Alicia [name] by phone today.  What matters to the patients health and wellness today?  Alicia [name] visited the doctor's office yesterday. She had been struggling with a stomach ulcer for a month, and it has finally healed, which made her very happy. Her weight remains stable at around 216 lbs, and she monitors it daily. She is consistent with taking her medications and doing household chores. Alicia [name] mentioned that she is avoiding the heat as it exhausts her and makes her breathless. She is staying hydrated without overdoing it.    Goals Addressed             This Visit's Progress    I want to learn about my CHF and keep it under control       Patient Goals/Self Care Activities: -Patient/Caregiver will self-administer medications as prescribed as evidenced by self-report/primary caregiver report  -Patient/Caregiver will attend all scheduled provider appointments as evidenced by clinician review of documented attendance to scheduled appointments and patient/caregiver report -Patient/Caregiver will call pharmacy for medication refills as evidenced by patient report and review of pharmacy fill history as appropriate -Patient/Caregiver will call provider office for new concerns or questions as evidenced by review of documented incoming telephone call notes and patient report -Patient/Caregiver verbalizes understanding of plan -Patient/Caregiver will focus on medication adherence by taking medications as prescribed  -Weigh daily and record (notify MD with 3 lb weight gain over night or 5 lb in a week) -Follow CHF Action Plan -Adhere to low sodium diet  -Continue to be careful in the sun stay hydrated        SDOH assessments and interventions completed:  No      Care Coordination Interventions:  Yes, provided   Interventions Today    Flowsheet Row Most Recent Value  Chronic Disease   Chronic disease during today's visit Congestive Heart Failure (CHF)  General Interventions   General Interventions Discussed/Reviewed General Interventions Reviewed  Exercise Interventions   Exercise Discussed/Reviewed Physical Activity  Physical Activity Discussed/Reviewed Physical Activity Discussed  Nutrition Interventions   Nutrition Discussed/Reviewed Nutrition Discussed  Pharmacy Interventions   Pharmacy Dicussed/Reviewed Pharmacy Topics Discussed  Safety Interventions   Safety Discussed/Reviewed Safety Discussed         Follow up plan: Follow up call scheduled for 06/30/23  10 am    Encounter Outcome:  Pt. Visit Completed   [name] RN, BSN, [facility] Care Coordinator [facility]   Phone: [phone]

## 2023-05-26 NOTE — Patient Instructions (Signed)
Visit Information  Thank you for taking time to visit with me today. Please don't hesitate to contact me if I can be of assistance to you.   Following are the goals we discussed today:   Goals Addressed             This Visit's Progress    I want to learn about my CHF and keep it under control       Patient Goals/Self Care Activities: -Patient/Caregiver will self-administer medications as prescribed as evidenced by self-report/primary caregiver report  -Patient/Caregiver will attend all scheduled provider appointments as evidenced by clinician review of documented attendance to scheduled appointments and patient/caregiver report -Patient/Caregiver will call pharmacy for medication refills as evidenced by patient report and review of pharmacy fill history as appropriate -Patient/Caregiver will call provider office for new concerns or questions as evidenced by review of documented incoming telephone call notes and patient report -Patient/Caregiver verbalizes understanding of plan -Patient/Caregiver will focus on medication adherence by taking medications as prescribed  -Weigh daily and record (notify MD with 3 lb weight gain over night or 5 lb in a week) -Follow CHF Action Plan -Adhere to low sodium diet  -Continue to be careful in the sun stay hydrated        Our next appointment is by telephone on 06/30/23 at 10 am  Please call the care guide team at [phone] if you need to cancel or reschedule your appointment.   If you are experiencing a Mental Health or Behavioral Health Crisis or need someone to talk to, please call 1-800-273-TALK (toll free, 24 hour hotline)  Patient verbalizes understanding of instructions and care plan provided today and agrees to view in MyChart. Active MyChart status and patient understanding of how to access instructions and care plan via MyChart confirmed with patient.     [name] RN, BSN, [facility] Care Coordinator [facility]   Phone:  [phone]

## 2023-05-26 NOTE — Assessment & Plan Note (Signed)
Exam shows impacted debris, does not look like cerumen, might actually be foodstuffs from her nighttime salivation.  Appreciate our nursing staff for help cleaning this area today.  Got a lot of it out, some still remains.  I recommended Debrox drops which she has at home, can also try mineral oil.  If the ear does not clear by the end of the week she should return for repeat cleaning, do not think we need ENT at this point.

## 2023-05-26 NOTE — Assessment & Plan Note (Addendum)
No acute concerns today.  She has a good handle on her insulin regimen and reports that she has enough Lantus to last her until her next refill is due.  Urine microalbumin today.  Discontinuing [name] as she was instructed to stop taking this medicine during last hospitalization has been having frequent yeast infections.

## 2023-05-28 NOTE — Progress Notes (Signed)
Internal Medicine Clinic Attending  Case discussed with Dr. McLendon  At the time of the visit.  We reviewed the resident's history and exam and pertinent patient test results.  I agree with the assessment, diagnosis, and plan of care documented in the resident's note.  

## 2023-06-03 ENCOUNTER — Other Ambulatory Visit: Payer: Self-pay | Admitting: Student in an Organized Health Care Education/Training Program

## 2023-06-03 DIAGNOSIS — E113393 Type 2 diabetes mellitus with moderate nonproliferative diabetic retinopathy without macular edema, bilateral: Secondary | ICD-10-CM

## 2023-06-07 ENCOUNTER — Telehealth: Payer: Self-pay | Admitting: *Deleted

## 2023-06-07 DIAGNOSIS — H6121 Impacted cerumen, right ear: Secondary | ICD-10-CM

## 2023-06-07 NOTE — Telephone Encounter (Signed)
Ok. ENT referral is reasonable, I have placed it.

## 2023-06-07 NOTE — Telephone Encounter (Signed)
[facility] pharmacy stated Trulicity 1.5 mg is on a nationwide backorder; the 0.75/0.5 mg is available. I called [facility] pharmacy - they do have the 1.5 mg. I called pt to let her know; she stated she has enough of the 1.5 mg to last about a month. She will call the office when she's needs a refill. Also pt was made aware ENT referral has been placed and our Referral Coordinator will call for appt.

## 2023-06-07 NOTE — Telephone Encounter (Signed)
Call from pt stating she's still having problem/pain with her ears; mainly the right. She's requesting ENT referral. Secondly, she wants to know if she still needs to take Zetia?  Also states she needs a refill on Trulicity - informed pt RF's are available - I will call [facility] when pharmacy is back from lunch @ 2PM.

## 2023-06-09 ENCOUNTER — Other Ambulatory Visit: Payer: Self-pay | Admitting: Student in an Organized Health Care Education/Training Program

## 2023-06-09 ENCOUNTER — Other Ambulatory Visit: Payer: Self-pay | Admitting: Nurse Practitioner

## 2023-06-09 DIAGNOSIS — I10 Essential (primary) hypertension: Secondary | ICD-10-CM

## 2023-06-15 ENCOUNTER — Encounter (HOSPITAL_COMMUNITY): Payer: Self-pay

## 2023-06-15 ENCOUNTER — Inpatient Hospital Stay (HOSPITAL_COMMUNITY)
Admission: EM | Admit: 2023-06-15 | Discharge: 2023-06-28 | DRG: 291 | Disposition: A | Discharge status: Other Acute Inpatient Hospital | Payer: 59 | Attending: Internal Medicine | Admitting: Internal Medicine

## 2023-06-15 ENCOUNTER — Telehealth: Payer: Self-pay | Admitting: *Deleted

## 2023-06-15 ENCOUNTER — Other Ambulatory Visit: Payer: Self-pay

## 2023-06-15 ENCOUNTER — Emergency Department (HOSPITAL_COMMUNITY): Payer: 59

## 2023-06-15 DIAGNOSIS — Y95 Nosocomial condition: Secondary | ICD-10-CM | POA: Diagnosis not present

## 2023-06-15 DIAGNOSIS — J301 Allergic rhinitis due to pollen: Secondary | ICD-10-CM | POA: Diagnosis present

## 2023-06-15 DIAGNOSIS — I4510 Unspecified right bundle-branch block: Secondary | ICD-10-CM | POA: Diagnosis present

## 2023-06-15 DIAGNOSIS — D72829 Elevated white blood cell count, unspecified: Secondary | ICD-10-CM | POA: Diagnosis present

## 2023-06-15 DIAGNOSIS — I480 Paroxysmal atrial fibrillation: Secondary | ICD-10-CM | POA: Diagnosis not present

## 2023-06-15 DIAGNOSIS — I5032 Chronic diastolic (congestive) heart failure: Secondary | ICD-10-CM

## 2023-06-15 DIAGNOSIS — R0602 Shortness of breath: Secondary | ICD-10-CM | POA: Diagnosis not present

## 2023-06-15 DIAGNOSIS — I472 Ventricular tachycardia, unspecified: Secondary | ICD-10-CM | POA: Diagnosis not present

## 2023-06-15 DIAGNOSIS — E1165 Type 2 diabetes mellitus with hyperglycemia: Secondary | ICD-10-CM | POA: Diagnosis not present

## 2023-06-15 DIAGNOSIS — R6889 Other general symptoms and signs: Secondary | ICD-10-CM | POA: Diagnosis not present

## 2023-06-15 DIAGNOSIS — Z87891 Personal history of nicotine dependence: Secondary | ICD-10-CM

## 2023-06-15 DIAGNOSIS — I272 Pulmonary hypertension, unspecified: Secondary | ICD-10-CM | POA: Diagnosis not present

## 2023-06-15 DIAGNOSIS — J441 Chronic obstructive pulmonary disease with (acute) exacerbation: Secondary | ICD-10-CM

## 2023-06-15 DIAGNOSIS — Z79899 Other long term (current) drug therapy: Secondary | ICD-10-CM

## 2023-06-15 DIAGNOSIS — R0989 Other specified symptoms and signs involving the circulatory and respiratory systems: Secondary | ICD-10-CM | POA: Diagnosis not present

## 2023-06-15 DIAGNOSIS — Z823 Family history of stroke: Secondary | ICD-10-CM

## 2023-06-15 DIAGNOSIS — I5031 Acute diastolic (congestive) heart failure: Secondary | ICD-10-CM

## 2023-06-15 DIAGNOSIS — I700 Atherosclerosis of aorta: Secondary | ICD-10-CM | POA: Diagnosis not present

## 2023-06-15 DIAGNOSIS — I509 Heart failure, unspecified: Secondary | ICD-10-CM

## 2023-06-15 DIAGNOSIS — E875 Hyperkalemia: Secondary | ICD-10-CM | POA: Diagnosis not present

## 2023-06-15 DIAGNOSIS — K219 Gastro-esophageal reflux disease without esophagitis: Secondary | ICD-10-CM | POA: Diagnosis present

## 2023-06-15 DIAGNOSIS — E119 Type 2 diabetes mellitus without complications: Secondary | ICD-10-CM | POA: Diagnosis not present

## 2023-06-15 DIAGNOSIS — J189 Pneumonia, unspecified organism: Secondary | ICD-10-CM | POA: Diagnosis not present

## 2023-06-15 DIAGNOSIS — J9601 Acute respiratory failure with hypoxia: Secondary | ICD-10-CM

## 2023-06-15 DIAGNOSIS — R140 Abdominal distension (gaseous): Secondary | ICD-10-CM | POA: Diagnosis not present

## 2023-06-15 DIAGNOSIS — I495 Sick sinus syndrome: Secondary | ICD-10-CM | POA: Diagnosis not present

## 2023-06-15 DIAGNOSIS — J9621 Acute and chronic respiratory failure with hypoxia: Secondary | ICD-10-CM

## 2023-06-15 DIAGNOSIS — T380X5A Adverse effect of glucocorticoids and synthetic analogues, initial encounter: Secondary | ICD-10-CM | POA: Diagnosis not present

## 2023-06-15 DIAGNOSIS — E873 Alkalosis: Secondary | ICD-10-CM | POA: Diagnosis not present

## 2023-06-15 DIAGNOSIS — I5033 Acute on chronic diastolic (congestive) heart failure: Secondary | ICD-10-CM | POA: Diagnosis not present

## 2023-06-15 DIAGNOSIS — R059 Cough, unspecified: Secondary | ICD-10-CM | POA: Diagnosis not present

## 2023-06-15 DIAGNOSIS — R739 Hyperglycemia, unspecified: Secondary | ICD-10-CM | POA: Diagnosis not present

## 2023-06-15 DIAGNOSIS — Z9981 Dependence on supplemental oxygen: Secondary | ICD-10-CM

## 2023-06-15 DIAGNOSIS — Z8249 Family history of ischemic heart disease and other diseases of the circulatory system: Secondary | ICD-10-CM

## 2023-06-15 DIAGNOSIS — Z1152 Encounter for screening for COVID-19: Secondary | ICD-10-CM

## 2023-06-15 DIAGNOSIS — I2510 Atherosclerotic heart disease of native coronary artery without angina pectoris: Secondary | ICD-10-CM | POA: Diagnosis present

## 2023-06-15 DIAGNOSIS — J9811 Atelectasis: Secondary | ICD-10-CM | POA: Diagnosis not present

## 2023-06-15 DIAGNOSIS — Z825 Family history of asthma and other chronic lower respiratory diseases: Secondary | ICD-10-CM

## 2023-06-15 DIAGNOSIS — E669 Obesity, unspecified: Secondary | ICD-10-CM | POA: Diagnosis present

## 2023-06-15 DIAGNOSIS — N179 Acute kidney failure, unspecified: Secondary | ICD-10-CM | POA: Diagnosis not present

## 2023-06-15 DIAGNOSIS — E874 Mixed disorder of acid-base balance: Secondary | ICD-10-CM | POA: Diagnosis present

## 2023-06-15 DIAGNOSIS — R918 Other nonspecific abnormal finding of lung field: Secondary | ICD-10-CM | POA: Diagnosis not present

## 2023-06-15 DIAGNOSIS — Z743 Need for continuous supervision: Secondary | ICD-10-CM | POA: Diagnosis not present

## 2023-06-15 DIAGNOSIS — Z881 Allergy status to other antibiotic agents status: Secondary | ICD-10-CM

## 2023-06-15 DIAGNOSIS — H9201 Otalgia, right ear: Secondary | ICD-10-CM | POA: Diagnosis present

## 2023-06-15 DIAGNOSIS — J9611 Chronic respiratory failure with hypoxia: Secondary | ICD-10-CM

## 2023-06-15 DIAGNOSIS — R069 Unspecified abnormalities of breathing: Secondary | ICD-10-CM | POA: Diagnosis not present

## 2023-06-15 DIAGNOSIS — I517 Cardiomegaly: Secondary | ICD-10-CM | POA: Diagnosis not present

## 2023-06-15 DIAGNOSIS — I110 Hypertensive heart disease with heart failure: Secondary | ICD-10-CM | POA: Diagnosis not present

## 2023-06-15 DIAGNOSIS — E785 Hyperlipidemia, unspecified: Secondary | ICD-10-CM | POA: Diagnosis not present

## 2023-06-15 DIAGNOSIS — J449 Chronic obstructive pulmonary disease, unspecified: Secondary | ICD-10-CM | POA: Diagnosis present

## 2023-06-15 DIAGNOSIS — R490 Dysphonia: Secondary | ICD-10-CM | POA: Diagnosis not present

## 2023-06-15 DIAGNOSIS — J90 Pleural effusion, not elsewhere classified: Secondary | ICD-10-CM | POA: Diagnosis not present

## 2023-06-15 DIAGNOSIS — Z833 Family history of diabetes mellitus: Secondary | ICD-10-CM

## 2023-06-15 DIAGNOSIS — H6692 Otitis media, unspecified, left ear: Secondary | ICD-10-CM | POA: Diagnosis not present

## 2023-06-15 DIAGNOSIS — I10 Essential (primary) hypertension: Secondary | ICD-10-CM | POA: Diagnosis not present

## 2023-06-15 DIAGNOSIS — Z9049 Acquired absence of other specified parts of digestive tract: Secondary | ICD-10-CM

## 2023-06-15 DIAGNOSIS — R0609 Other forms of dyspnea: Secondary | ICD-10-CM | POA: Diagnosis not present

## 2023-06-15 DIAGNOSIS — J811 Chronic pulmonary edema: Secondary | ICD-10-CM | POA: Diagnosis not present

## 2023-06-15 DIAGNOSIS — E113393 Type 2 diabetes mellitus with moderate nonproliferative diabetic retinopathy without macular edema, bilateral: Secondary | ICD-10-CM

## 2023-06-15 DIAGNOSIS — I5030 Unspecified diastolic (congestive) heart failure: Secondary | ICD-10-CM | POA: Diagnosis not present

## 2023-06-15 DIAGNOSIS — Z6832 Body mass index (BMI) 32.0-32.9, adult: Secondary | ICD-10-CM

## 2023-06-15 DIAGNOSIS — Z888 Allergy status to other drugs, medicaments and biological substances status: Secondary | ICD-10-CM

## 2023-06-15 DIAGNOSIS — Z8673 Personal history of transient ischemic attack (TIA), and cerebral infarction without residual deficits: Secondary | ICD-10-CM

## 2023-06-15 DIAGNOSIS — E8779 Other fluid overload: Secondary | ICD-10-CM | POA: Diagnosis not present

## 2023-06-15 DIAGNOSIS — R404 Transient alteration of awareness: Secondary | ICD-10-CM | POA: Diagnosis not present

## 2023-06-15 LAB — I-STAT VENOUS BLOOD GAS, ED
Acid-Base Excess: 5 mmol/L — ABNORMAL HIGH (ref 0.0–2.0)
Bicarbonate: 31.9 mmol/L — ABNORMAL HIGH (ref 20.0–28.0)
Calcium, Ion: 1.09 mmol/L — ABNORMAL LOW (ref 1.15–1.40)
HCT: 30 % — ABNORMAL LOW (ref 36.0–46.0)
Hemoglobin: 10.2 g/dL — ABNORMAL LOW (ref 12.0–15.0)
O2 Saturation: 75 %
Potassium: 5.1 mmol/L (ref 3.5–5.1)
Sodium: 132 mmol/L — ABNORMAL LOW (ref 135–145)
TCO2: 34 mmol/L — ABNORMAL HIGH (ref 22–32)
pCO2, Ven: 57.1 mmHg (ref 44–60)
pH, Ven: 7.356 (ref 7.25–7.43)
pO2, Ven: 43 mmHg (ref 32–45)

## 2023-06-15 LAB — CBC WITH DIFFERENTIAL/PLATELET
Abs Immature Granulocytes: 0.03 10*3/uL (ref 0.00–0.07)
Basophils Absolute: 0 10*3/uL (ref 0.0–0.1)
Basophils Relative: 0 %
Eosinophils Absolute: 0 10*3/uL (ref 0.0–0.5)
Eosinophils Relative: 0 %
HCT: 30.5 % — ABNORMAL LOW (ref 36.0–46.0)
Hemoglobin: 9.2 g/dL — ABNORMAL LOW (ref 12.0–15.0)
Immature Granulocytes: 0 %
Lymphocytes Relative: 5 %
Lymphs Abs: 0.4 10*3/uL — ABNORMAL LOW (ref 0.7–4.0)
MCH: 25.9 pg — ABNORMAL LOW (ref 26.0–34.0)
MCHC: 30.2 g/dL (ref 30.0–36.0)
MCV: 85.9 fL (ref 80.0–100.0)
Monocytes Absolute: 0.4 10*3/uL (ref 0.1–1.0)
Monocytes Relative: 5 %
Neutro Abs: 8.8 10*3/uL — ABNORMAL HIGH (ref 1.7–7.7)
Neutrophils Relative %: 90 %
Platelets: 255 10*3/uL (ref 150–400)
RBC: 3.55 MIL/uL — ABNORMAL LOW (ref 3.87–5.11)
RDW: 15.4 % (ref 11.5–15.5)
WBC: 9.8 10*3/uL (ref 4.0–10.5)
nRBC: 0.2 % (ref 0.0–0.2)

## 2023-06-15 LAB — COMPREHENSIVE METABOLIC PANEL
ALT: 20 U/L (ref 0–44)
AST: 16 U/L (ref 15–41)
Albumin: 3.1 g/dL — ABNORMAL LOW (ref 3.5–5.0)
Alkaline Phosphatase: 87 U/L (ref 38–126)
Anion gap: 15 (ref 5–15)
BUN: 45 mg/dL — ABNORMAL HIGH (ref 8–23)
CO2: 28 mmol/L (ref 22–32)
Calcium: 8.7 mg/dL — ABNORMAL LOW (ref 8.9–10.3)
Chloride: 91 mmol/L — ABNORMAL LOW (ref 98–111)
Creatinine, Ser: 1.63 mg/dL — ABNORMAL HIGH (ref 0.44–1.00)
GFR, Estimated: 35 mL/min — ABNORMAL LOW (ref >60)
Glucose, Bld: 316 mg/dL — ABNORMAL HIGH (ref 70–99)
Potassium: 5.1 mmol/L (ref 3.5–5.1)
Sodium: 134 mmol/L — ABNORMAL LOW (ref 135–145)
Total Bilirubin: 1 mg/dL (ref 0.3–1.2)
Total Protein: 6.9 g/dL (ref 6.5–8.1)

## 2023-06-15 LAB — RESP PANEL BY RT-PCR (RSV, FLU A&B, COVID)  RVPGX2
Influenza A by PCR: NEGATIVE
Influenza B by PCR: NEGATIVE
Resp Syncytial Virus by PCR: NEGATIVE
SARS Coronavirus 2 by RT PCR: NEGATIVE

## 2023-06-15 LAB — TROPONIN I (HIGH SENSITIVITY)
Troponin I (High Sensitivity): 19 ng/L — ABNORMAL HIGH (ref <18)
Troponin I (High Sensitivity): 21 ng/L — ABNORMAL HIGH (ref <18)

## 2023-06-15 LAB — BRAIN NATRIURETIC PEPTIDE: B Natriuretic Peptide: 3750.1 pg/mL — ABNORMAL HIGH (ref 0.0–100.0)

## 2023-06-15 MED ORDER — IPRATROPIUM-ALBUTEROL 0.5-2.5 (3) MG/3ML IN SOLN
3.0000 mL | RESPIRATORY_TRACT | Status: AC
  Administered 2023-06-15 (×3): 3 mL via RESPIRATORY_TRACT
  Filled 2023-06-15: qty 9

## 2023-06-15 MED ORDER — MENTHOL 3 MG MT LOZG
1.0000 | LOZENGE | OROMUCOSAL | Status: DC | PRN
  Filled 2023-06-15 (×3): qty 9

## 2023-06-15 MED ORDER — HEPARIN SODIUM (PORCINE) 5000 UNIT/ML IJ SOLN
5000.0000 [IU] | Freq: Three times a day (TID) | INTRAMUSCULAR | Status: DC
  Administered 2023-06-15 – 2023-06-16 (×2): 5000 [IU] via SUBCUTANEOUS
  Filled 2023-06-15 (×2): qty 1

## 2023-06-15 MED ORDER — FUROSEMIDE 10 MG/ML IJ SOLN
80.0000 mg | Freq: Three times a day (TID) | INTRAMUSCULAR | Status: DC
  Administered 2023-06-16 (×2): 80 mg via INTRAVENOUS
  Filled 2023-06-15 (×2): qty 8

## 2023-06-15 MED ORDER — SENNA 8.6 MG PO TABS
2.0000 | ORAL_TABLET | Freq: Every day | ORAL | Status: DC
  Administered 2023-06-16 – 2023-06-26 (×10): 17.2 mg via ORAL
  Filled 2023-06-15 (×11): qty 2

## 2023-06-15 MED ORDER — FUROSEMIDE 10 MG/ML IJ SOLN
60.0000 mg | Freq: Once | INTRAMUSCULAR | Status: AC
  Administered 2023-06-15: 60 mg via INTRAVENOUS
  Filled 2023-06-15: qty 6

## 2023-06-15 MED ORDER — IPRATROPIUM-ALBUTEROL 0.5-2.5 (3) MG/3ML IN SOLN
3.0000 mL | Freq: Four times a day (QID) | RESPIRATORY_TRACT | Status: DC
  Administered 2023-06-15 – 2023-06-16 (×4): 3 mL via RESPIRATORY_TRACT
  Filled 2023-06-15 (×4): qty 3

## 2023-06-15 MED ORDER — AMLODIPINE BESYLATE 10 MG PO TABS
10.0000 mg | ORAL_TABLET | Freq: Every day | ORAL | Status: DC
  Administered 2023-06-16 – 2023-06-21 (×6): 10 mg via ORAL
  Filled 2023-06-15 (×2): qty 1
  Filled 2023-06-15 (×2): qty 2
  Filled 2023-06-15 (×2): qty 1

## 2023-06-15 MED ORDER — MAGNESIUM SULFATE 2 GM/50ML IV SOLN
2.0000 g | Freq: Once | INTRAVENOUS | Status: AC
  Administered 2023-06-15: 2 g via INTRAVENOUS
  Filled 2023-06-15: qty 50

## 2023-06-15 MED ORDER — ASPIRIN 81 MG PO CHEW
81.0000 mg | CHEWABLE_TABLET | Freq: Every day | ORAL | Status: DC
  Administered 2023-06-16 – 2023-06-28 (×13): 81 mg via ORAL
  Filled 2023-06-15 (×13): qty 1

## 2023-06-15 MED ORDER — RIVAROXABAN 10 MG PO TABS: 10.0000 mg | ORAL_TABLET | Freq: Every day | ORAL | Status: DC

## 2023-06-15 MED ORDER — MOMETASONE FURO-FORMOTEROL FUM 100-5 MCG/ACT IN AERO
2.0000 | INHALATION_SPRAY | Freq: Two times a day (BID) | RESPIRATORY_TRACT | Status: DC
  Filled 2023-06-15: qty 8.8

## 2023-06-15 MED ORDER — AMOXICILLIN-POT CLAVULANATE 875-125 MG PO TABS
1.0000 | ORAL_TABLET | Freq: Two times a day (BID) | ORAL | Status: DC
  Administered 2023-06-15: 1 via ORAL
  Filled 2023-06-15: qty 1

## 2023-06-15 MED ORDER — INSULIN GLARGINE-YFGN 100 UNIT/ML ~~LOC~~ SOLN
20.0000 [IU] | Freq: Every day | SUBCUTANEOUS | Status: DC
  Administered 2023-06-16 – 2023-06-17 (×2): 20 [IU] via SUBCUTANEOUS
  Filled 2023-06-15 (×2): qty 0.2

## 2023-06-15 MED ORDER — PREDNISONE 20 MG PO TABS
40.0000 mg | ORAL_TABLET | Freq: Every day | ORAL | Status: AC
  Administered 2023-06-16 – 2023-06-19 (×4): 40 mg via ORAL
  Filled 2023-06-15 (×4): qty 2

## 2023-06-15 MED ORDER — METHYLPREDNISOLONE SODIUM SUCC 125 MG IJ SOLR
125.0000 mg | Freq: Once | INTRAMUSCULAR | Status: AC
  Administered 2023-06-15: 125 mg via INTRAVENOUS
  Filled 2023-06-15: qty 2

## 2023-06-15 MED ORDER — POLYMYXIN B-TRIMETHOPRIM 10000-0.1 UNIT/ML-% OP SOLN
1.0000 [drp] | OPHTHALMIC | Status: DC
  Administered 2023-06-17 – 2023-06-26 (×48): 1 [drp] via OPHTHALMIC
  Filled 2023-06-15: qty 10

## 2023-06-15 MED ORDER — IPRATROPIUM-ALBUTEROL 0.5-2.5 (3) MG/3ML IN SOLN: 3.0000 mL | RESPIRATORY_TRACT | Status: DC | PRN

## 2023-06-15 NOTE — ED Notes (Signed)
HHFNC placed on patient by RT, patient continues purse lip breathing. O2 stats 98%. MD made aware

## 2023-06-15 NOTE — ED Notes (Signed)
Patient placed on NRB due to patient unable to tolerate HHFNC. RT present. MD made aware. SPO2 @ 100%. Patient continues purse lip breathing Will continue to monitor

## 2023-06-15 NOTE — ED Notes (Signed)
Patient not tolerating Bipap continues to pull at it and requesting to take off. RT and MD notified. RT placed patient on [place] stats continue to decrease 80%.

## 2023-06-15 NOTE — Progress Notes (Signed)
   06/15/23 1928  Therapy Vitals  Pulse Rate (!) 48  Resp (!) 24  Patient Position (if appropriate) Lying  MEWS Score/Color  MEWS Score 2  MEWS Score Color Yellow  Oxygen Therapy/Pulse Ox  O2 Device Nasal Cannula  O2 Therapy Oxygen  O2 Flow Rate (L/min) 6 L/min  SpO2 97 %

## 2023-06-15 NOTE — Hospital Course (Addendum)
NOT to include in note, for our info: Spoke with Merlene Laughter, the Respiratory Therapist, on 8/3 who informed us that she had recently found out that, on the night of 8/1, the HFNC at 90% FiO2 had been started by a respiratory therapist who had mistakenly thought that, to wean the pt off a higher L volume of HFNC, the FiO2 should be increased. Does not seem that the pt was actually decompensating to the point of needing a higher FiO2, but rather this was increased due to misunderstanding about how weaning HFNC therapy works. Dorene Grebe requested that this conversation be left out of our notes so as not to undermine the Respiratory Therapy team dynamic. Will Safety Zone this.  ______________  Acute on chronic hypoxic respiratory failure 2/2 Heart Failure exacerbation, COPD exacerbation Pt has a history of chronic respiratory failure in setting of HFpEF and COPD requiring 4-5L O2 at baseline. Pt presented 7/23 with increasing O2 needs and desaturations suspected to be 2/2 to combination of COPD exacerbation in the setting of viral URI combined with HF exacerbation. CXR showed evidence of pulmonary congestion likely in setting of pulmonary edema, although consolidation could not be ruled out. Pt started on diuresis and O2 supplementation. Initially on BiPAP, later transitioned to HFNC with decreasing O2 requirements until evening of 7/27, when pt desated and required BiPAP overnight. She intermittently required nighttime BiPAP throughout hospitalization. Repeat CXR done 7/31 after increased O2 needs showed signs of fluid, especially on R side, no evidence PNA; this was after am torsemide held for low K. Torsemide restarted in pm on 7/31 as K improved following supplementation. COVID PCR done 8/1 was negative.  Overnight on 8/1, pt was placed on HFNC with FiO2 of 90%-100% by nightshift RT; there was no documented reason of pt decompensation or desaturation. Upon assessment of this pt the following morning on  8/2, she was stable on HFNC at 15 L/min at FiO2 of 60%. She has not required FiO2 of greater than 70% following this. We did pursue workup of the increased FiO2 with repeat CXR done 8/2 showing stable bibasilar opacities as compared to CXR done 7/31. Pt started on IV Vancomycin and Cefepime over concern of possible HAP on 8/2. Pt seen by PCCM on 8/2 and did not believe pt needed ICU-level care at this time; they recommended increasing Pulmicort dose from 0.25 mg BID to 0.5 mg BID. They also recommended starting her on IV Solu-medrol 40 mg BID and Yupelri nebs in addition to her other breathing treatments. Sputum culture collected 8/2 showed ***gram positive cocci in pairs. Gave pt addition IV Lasix 120 mg dose x1 on 8/2 for further diuresis.   ***Pt will be discharged to [facility] for continued O2 therapy with goal of weaning O2 down as much as possible.  HFpEF Echo done 02/07/23 showed LVEF preserved at 60-65%, moderate LVH, Grade II diastolic dysfunction. Repeat echo done 06/16/23 showed LVEF 55-60%, elevated left end diastolic pressure, and IVC dilation suggestive of fluid overload. She was on torsemide, Entresto, spironolactone, Trulicity outpatient for HFpEF; all but torsemide held in the hospital.   COPD Pt with a history of COPD requiring 4-5L O2 at home at baseline. Was on Dulera at home. In the hospital, she was treated with Pulmicort, Brovana, levalbueterol (pt experienced palpitations with albuterol/Duonebs), ipratropium. She was also given a 5 day prednisone burst completed 7/27. Also treated with guaifenesin, hypertonic NS to improve cough/breathing. Pt started on increased Pulmicort (0.5 mg BID from 0.25 mg BID), Yupelri nebs,  and IV Solu-medrol 40 mg BID by PCCM on 8/2. She should be transitioned from [facility] to Trelegy or Breztri inhaler at discharge from [facility].   AKI, resolved anuria Baseline Cr around 0.9. Pt's Cr elevated in the hospital; was initially anuric while in the hospital. Renal US  done 7/24 showed no acute findings, no hydronephrosis. Bladder scan showed 200 cc in bladder. Foley placed morning of 7/24 with 150 cc drained. She was started on IV Lasix at 120 mg TID; outpatient, she was on torsemide 60 mg daily. Transitioned from IV diuresis to po torsemide 40 mg BID on 7/28, then transitioned to torsemide 40 mg daily on 7/29. Foley removed on 7/29.  Paroxysmal Atrial Fibrillation Pt had episode of A fib with RVR following replacement of BiPAP overnight on 7/27. She was started on Eliquis 5 mg BID for stroke prophylaxis; she was started on Lopressor 25 mg BID for rate control. Lopressor held on 7/31 in setting of HR in 50s.   Junctional Rhythm Pt has history of SSS. EKG on 7/24 showed junctional rhythm. Pt then had asymptomatic NSVT episode on 7/25. Avoiding AV nodal blocking agents.   T2DM Home regimen includes Trulicity, Lantus and Humalog. Initially hyperglycemic during stay, thought to be impacted by steroid treatment. She was managed on basal insulin, Novolog TID, and SSI.  Acute Otitis Media, resolved Pt previously reported weeks of R ear pain and on admission exam had an opaque bulging TM on the Right. Pt managed with Augmentin 875-125 mg BID 5 day course with resolution of ear pain.  Respiratory Alkalosis with concomitate Metabolic Alkalosis, improving Pt likely has respiratory acidosis at baseline due to COPD with compensating metabolic alkalosis at baseline. Contraction alkalosis following diuresis, resolving.    Chronic Conditions: Normocytic anemia: Stable from prior. Hypertension: Holding amlodipine 10 mg daily at nephro's rec. Holding Entresto, spironolactone. Hyperlipidemia, CAD: Continued on atorvastatin 80 mg daily, Zetia 10 mg daily, ASA 81 mg daily.  ____   Ms. Brogden,  It was a pleasure caring for you during admission! You came to the hospital with shortness of breath, which we believe was due to a combination of exacerbations of both your heart  failure and your COPD. You were treated with diuresis (making you urinate more to get the extra fluid out of your body) and with inhaled breathing treatments.   You were able to come off of BiPAP (the mask oxygen therapy) during the day, but you still needed occasional BiPAP at night. You also were not able to come off of the high flow nasal cannula oxygen supplementation during the day; you were not able to return to your home dose of oxygen. We think this is because your lungs do not have a lot of reserve, and so when something worsens their function, it takes a long time for them to recover. Because of this need for a longer recovery time, you are being discharged to [facility], the Long-term Acute [facility] Hospital unit on the 5th floor of [place].  While in [facility], please continue the following: Try to get up and out of bed as often as you are able to, with help from your nurses. Keep using the spirometer (the little plastic device you suck on) to improve your lung function. Take all your breathing treatments! You will keep getting nebulized treatments in the [facility].  There were some medication changes made while you were in the hospital: For keeping fluid off: - Take torsemide 40 mg once a day (this is  your diuretic, your fluid pill) - Take potassium chloride 40 mEq once a day (you need to supplement your potassium when you're on a diuretic)  ***For your breathing: - We will continue your in-hospital nebulized breathing treatments at the [facility] - We will continue your IV antibiotics at the [facility] - When you fully discharge from the hospital, we will switch your [facility] to Trelegy, an inhaler that has three breathing treatments in one  To prevent strokes: - Take Eliquis 5 mg two times a day  For your cholesterol/heart: - Take Lipitor (atorvastatin) 80 mg once a day - Take Zetia (ezetimibe) 10 mg once a day - Take aspirin 81 mg once a day  For your diabetes: - We will continue your in-hospital  insulin treatments at the [facility]  I know it's taken longer than we all expected to fully recover, but we are glad you're feeling better than you were when you first came to the hospital. You will continue to receive great care at the [facility]!  Thank you for allowing [name] to be a part of your care team, Governor Rooks, medical student Dr. Jeral Pinch, PGY1 Dr. Modena Slater, PGY2   06/27/23 "I feel great but something happened last night". Didn't sleep at all. Ate a chicken salad -- "my stomach swole up." Passing flautus. Reports GI bloateness/gassiness. Left neck globus sensation starting yesterday. Reports difficulty swallowing food.   Physical exam: unremarkable. Abdomen soft to palpation.  Plan: Did not need bipap. Remained on HFNC. Plan to move to [facility]. Patient amenable to this plan.

## 2023-06-15 NOTE — Telephone Encounter (Signed)
Patient called in stating, "I feel bad" x 1 week. Stays with daughter who was positive for covid. Patient has not tested. C/o non-productive, intermittent cough, relieved with albuterol inhaler. Weakness, "I can barely walk," shaking, Temp 102 last evening, not eating, but drinking ginger ale, blood sugars > 500. Began with sore throat which is better now. She is advised to head directly to ED. States her daughter is well now and will drive her to ED today.

## 2023-06-15 NOTE — Progress Notes (Signed)
   06/15/23 1928  Therapy Vitals  Pulse Rate (!) 48  Resp (!) 24  Patient Position (if appropriate) Lying  MEWS Score/Color  MEWS Score 2  MEWS Score Color Yellow  Oxygen Therapy/Pulse Ox  O2 Device Nasal Cannula  O2 Therapy Oxygen  O2 Flow Rate (L/min) 10 L/min  SpO2 97 %   Removed pt. Off bipap per md order

## 2023-06-15 NOTE — Progress Notes (Signed)
   06/15/23 1932  Vent Select  $ Ventilator Initial/Subsequent  Initial  Adult Vent Y  Vent start date 06/15/23  Vent start time 1704  Adult Ventilator Settings  Vent Type Servo i  Vent Mode PCV;BIPAP  Set Rate 15 bmp  FiO2 (%) 100 %  I Time 0.9 Sec(s)  Pressure Control 18 cmH20  PEEP 6 cmH20  Adult Ventilator Measurements  Peak Airway Pressure 12 L/min  Resp Rate Spontaneous 15 br/min  Resp Rate Total 30 br/min  Exhaled Vt 868 mL  Measured Ve 17 L  I:E Ratio Measured 1:1.4  Auto PEEP 0 cmH20  Total PEEP 6 cmH20  SpO2 98 %  Adult Ventilator Alarms  Alarms On Y  Ve High Alarm 40 L/min  Ve Low Alarm 5 L/min  Resp Rate High Alarm 42 br/min  Resp Rate Low Alarm 8  PEEP Low Alarm 3 cmH2O  Press High Alarm 40 cmH2O  VAP Prevention  HOB> 30 Degrees Y  Equipment wiped down Yes  Breath Sounds  Bilateral Breath Sounds Clear;Diminished   Placed pt. Back on bipap due of desaturation and increased WOB
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
## 2023-06-15 NOTE — ED Provider Notes (Signed)
[place] EMERGENCY DEPARTMENT AT [facility] Provider Note   CSN: [id number] Arrival date & time: 06/15/23  1626     History Chief Complaint  Patient presents with   Respiratory Distress    HPI Alicia [name] is a 67 y.o. female presenting for chief complaint of shortness of breath.  This is a delightful 67 year old female with a history of shortness of breath secondary to chronic hypoxic respiratory failure in the setting of combined heart failure and COPD on 4 L at baseline.  States that over the last 3 days she has had progressive shortness of breath cough sputum production.  Denies objective fevers but endorses chills and dyspnea.  States that she was satting in the 50% on her home pulse ox on her 4 L nasal cannula called EMS.  Started on nonrebreather and transfer with some mild improvement.  She does state that she is starting to feel tired today.  She has been using albuterol inhalers or nebulizers 3 times daily without much symptomatic improvement. States her blood sugars have been elevated all week as well.  Denies any chest pain at this time is endorsing ongoing dyspnea..  Patient's recorded medical, surgical, social, medication list and allergies were reviewed in the Snapshot window as part of the initial history.   Review of Systems   Review of Systems  Constitutional:  Positive for fatigue. Negative for chills and fever.  HENT:  Negative for ear pain and sore throat.   Eyes:  Negative for pain and visual disturbance.  Respiratory:  Positive for cough, shortness of breath and wheezing.   Cardiovascular:  Negative for chest pain and palpitations.  Gastrointestinal:  Negative for abdominal pain and vomiting.  Genitourinary:  Negative for dysuria and hematuria.  Musculoskeletal:  Negative for arthralgias and back pain.  Skin:  Negative for color change and rash.  Neurological:  Negative for seizures and syncope.  All other systems reviewed and are  negative.   Physical Exam Updated Vital Signs BP (!) 136/99   Pulse (!) 58   Temp 97.9 F (36.6 C)   Resp (!) 21   Ht 5\' 7"  (1.702 m)   Wt 98 kg   LMP  (LMP Unknown)   SpO2 98%   BMI 33.83 kg/m  Physical Exam Vitals and nursing note reviewed.  Constitutional:      General: She is not in acute distress.    Appearance: She is well-developed.  HENT:     Head: Normocephalic and atraumatic.  Eyes:     Conjunctiva/sclera: Conjunctivae normal.  Cardiovascular:     Rate and Rhythm: Normal rate and regular rhythm.     Heart sounds: No murmur heard. Pulmonary:     Effort: Respiratory distress present.     Breath sounds: Wheezing present.  Abdominal:     General: There is no distension.     Palpations: Abdomen is soft.     Tenderness: There is no abdominal tenderness. There is no right CVA tenderness or left CVA tenderness.  Musculoskeletal:        General: No swelling or tenderness. Normal range of motion.     Cervical back: Neck supple.  Skin:    General: Skin is warm and dry.  Neurological:     General: No focal deficit present.     Mental Status: She is alert and oriented to person, place, and time. Mental status is at baseline.     Cranial Nerves: No cranial nerve deficit.  ED Course/ Medical Decision Making/ A&P Clinical Course as of 06/15/23 2324  Tue Jun 15, 2023  1931 Failed BiPAP trial [CC]    Clinical Course User Index [CC] Glyn Ade, MD    Procedures .Critical Care  Performed by: Glyn Ade, MD Authorized by: Glyn Ade, MD   Critical care provider statement:    Critical care time (minutes):  90   Critical care was necessary to treat or prevent imminent or life-threatening deterioration of the following conditions:  Respiratory failure   Critical care was time spent personally by me on the following activities:  Development of treatment plan with patient or surrogate, discussions with consultants, evaluation of patient's  response to treatment, examination of patient, ordering and review of laboratory studies, ordering and review of radiographic studies, ordering and performing treatments and interventions, pulse oximetry, re-evaluation of patient's condition and review of old charts    Medications Ordered in ED Medications  amLODipine (NORVASC) tablet 10 mg (has no administration in time range)  aspirin chewable tablet 81 mg (has no administration in time range)  insulin glargine-yfgn (SEMGLEE) injection 20 Units (has no administration in time range)  mometasone-formoterol (DULERA) 100-5 MCG/ACT inhaler 2 puff (2 puffs Inhalation Not Given 06/15/23 2226)  senna (SENOKOT) tablet 17.2 mg (has no administration in time range)  menthol-cetylpyridinium (CEPACOL) lozenge 3 mg (has no administration in time range)  amoxicillin-clavulanate (AUGMENTIN) 875-125 MG per tablet 1 tablet (1 tablet Oral Given 06/15/23 2228)  trimethoprim-polymyxin b (POLYTRIM) ophthalmic solution 1 drop (has no administration in time range)  ipratropium-albuterol (DUONEB) 0.5-2.5 (3) MG/3ML nebulizer solution 3 mL (3 mLs Nebulization Given 06/15/23 2240)  predniSONE (DELTASONE) tablet 40 mg (has no administration in time range)  heparin injection 5,000 Units (5,000 Units Subcutaneous Given 06/15/23 2228)  furosemide (LASIX) injection 80 mg (has no administration in time range)  magnesium sulfate IVPB 2 g 50 mL (0 g Intravenous Stopped 06/15/23 1831)  methylPREDNISolone sodium succinate (SOLU-MEDROL) 125 mg/2 mL injection 125 mg (125 mg Intravenous Given 06/15/23 1709)  furosemide (LASIX) injection 60 mg (60 mg Intravenous Given 06/15/23 1936)  ipratropium-albuterol (DUONEB) 0.5-2.5 (3) MG/3ML nebulizer solution 3 mL (3 mLs Nebulization Given 06/15/23 2009)  furosemide (LASIX) injection 60 mg (60 mg Intravenous Given 06/15/23 2056)    Medical Decision Making:    Alicia [name] is a 67 y.o. female who presented to the ED today with shortness of  breath, weight gain detailed above.     Complete initial physical exam performed, notably the patient  was in acute respiratory distress.  RN was emergently called to bedside for intervention.  Patient grossly dyspneic hypoxic to the 50% on a nonrebreather.  RT was called emergently to bedside patient stabilized on BiPAP with appropriate response.      Reviewed and confirmed nursing documentation for past medical history, family history, social history.    Initial Assessment:   With the patient's presentation of acute hypoxic respiratory failure, most likely diagnosis is acute heart failure exacerbation. Other diagnoses were considered including (but not limited to) COPD, pneumonia, pneumothorax, pulmonary embolism. These are considered less likely due to history of present illness and physical exam findings.   This is most consistent with an acute life/limb threatening illness complicated by underlying chronic conditions.  Initial Plan:  Stabilization as above Screening labs including CBC and Metabolic panel to evaluate for infectious or metabolic etiology of disease.  Urinalysis with reflex culture ordered to evaluate for UTI or relevant urologic/nephrologic pathology.  CXR to evaluate  for structural/infectious intrathoracic pathology.  Troponin/BNP/EKG to evaluate for cardiac pathology. Objective evaluation as below reviewed with plan for close reassessment  Initial Study Results:   Laboratory  All laboratory results reviewed without evidence of clinically relevant pathology.   Exceptions include: Elevation of BNP to 3000  EKG EKG was reviewed independently. Rate, rhythm, axis, intervals all examined and without medically relevant abnormality. ST segments without concerns for elevations.    Radiology  All images reviewed independently. Agree with radiology report at this time.   DG Chest Portable 1 View  Result Date: 06/15/2023 CLINICAL DATA:  Shortness of breath EXAM: PORTABLE CHEST  1 VIEW COMPARISON:  03/16/2023 and prior radiographs FINDINGS: Cardiomegaly with pulmonary vascular congestion noted. Bilateral interstitial opacities are present suspicious for interstitial edema. Question trace bilateral pleural effusions. Mild bibasilar opacities/atelectasis identified. There is no evidence of pneumothorax or acute bony abnormality. IMPRESSION: 1. Cardiomegaly with pulmonary vascular congestion and probable interstitial edema. 2. Question trace bilateral pleural effusions. 3. Mild bibasilar opacities, likely atelectasis. Electronically Signed   By: Harmon Pier M.D.   On: 06/15/2023 17:45     Consults:  Case discussed with internal medicine.   Reassessment and Plan:   On reassessment, trial of BiPAP removal and patient failed with recurrence of desaturation event. Patient diuresed aggressively in emergency department with 120 mg of Lasix administered IV with only mild urine output at time of consultation with internal medicine team. Patient reassessed frequently, family was updated and patient arranged for admission for further care and management.    Clinical Impression:  1. Acute respiratory failure with hypoxia (HCC)      Admit   Final Clinical Impression(s) / ED Diagnoses Final diagnoses:  Acute respiratory failure with hypoxia [facility])    Rx / DC Orders ED Discharge Orders     None         Glyn Ade, MD 06/15/23 2324

## 2023-06-15 NOTE — ED Notes (Signed)
Pt on nonrebreather. RT contacted regarding bipap.

## 2023-06-15 NOTE — Progress Notes (Signed)
   06/15/23 2238  Therapy Vitals  Patient Position (if appropriate) Lying  Oxygen Therapy/Pulse Ox  O2 Device Non-rebreather Mask   Pt wanted to be removed off the [facility] and be placed back on NRB RN aware

## 2023-06-15 NOTE — Progress Notes (Signed)
   06/15/23 2228  Therapy Vitals  Pulse Rate (!) 58  Resp (!) 21  Patient Position (if appropriate) Lying  MEWS Score/Color  MEWS Score 1  MEWS Score Color Green  Oxygen Therapy/Pulse Ox  O2 Device HFNC  $ High Flow Nasal Cannula  Yes  O2 Therapy Oxygen humidified  Heater temperature 93.2 F (34 C)  O2 Flow Rate (L/min) 60 L/min  FiO2 (%) 100 %  SpO2 97 %   Placed pt on heated high flow per md order  pt is still having increased WOB

## 2023-06-15 NOTE — ED Triage Notes (Signed)
PT BIB EMS from home on a nonrebreather, increased weakness, vomiting, diarrhea for a week, pt was unable to walk down the stairs at home, home O2 was on 4L, sats were 50's, increased to 6L [place] sats 70, placed on a non-rebreather and at 90%.   Pt states he CBG's have been elevated all week.  CBG 374 BP 160/62 HR 64

## 2023-06-16 ENCOUNTER — Inpatient Hospital Stay (HOSPITAL_COMMUNITY): Payer: 59

## 2023-06-16 DIAGNOSIS — R0609 Other forms of dyspnea: Secondary | ICD-10-CM | POA: Diagnosis not present

## 2023-06-16 DIAGNOSIS — I5033 Acute on chronic diastolic (congestive) heart failure: Secondary | ICD-10-CM | POA: Diagnosis not present

## 2023-06-16 DIAGNOSIS — J9621 Acute and chronic respiratory failure with hypoxia: Secondary | ICD-10-CM | POA: Diagnosis not present

## 2023-06-16 LAB — URINALYSIS, COMPLETE (UACMP) WITH MICROSCOPIC
Bilirubin Urine: NEGATIVE
Glucose, UA: 50 mg/dL — AB
Ketones, ur: NEGATIVE mg/dL
Leukocytes,Ua: NEGATIVE
Nitrite: NEGATIVE
Protein, ur: 100 mg/dL — AB
Specific Gravity, Urine: 1.013 (ref 1.005–1.030)
pH: 5 (ref 5.0–8.0)

## 2023-06-16 LAB — ECHOCARDIOGRAM COMPLETE
AR max vel: 2.53 cm2
AV Peak grad: 11.2 mmHg
Ao pk vel: 1.67 m/s
Area-P 1/2: 4.21 cm2
Height: 67 in
MV M vel: 3.28 m/s
MV Peak grad: 43 mmHg
S' Lateral: 4.3 cm
Weight: 3456 oz

## 2023-06-16 LAB — RESPIRATORY PANEL BY PCR

## 2023-06-16 LAB — COMPREHENSIVE METABOLIC PANEL
ALT: 20 U/L (ref 0–44)
AST: 14 U/L — ABNORMAL LOW (ref 15–41)
Albumin: 3 g/dL — ABNORMAL LOW (ref 3.5–5.0)
Alkaline Phosphatase: 89 U/L (ref 38–126)
Anion gap: 13 (ref 5–15)
BUN: 56 mg/dL — ABNORMAL HIGH (ref 8–23)
CO2: 26 mmol/L (ref 22–32)
Calcium: 8.8 mg/dL — ABNORMAL LOW (ref 8.9–10.3)
Chloride: 92 mmol/L — ABNORMAL LOW (ref 98–111)
Creatinine, Ser: 1.83 mg/dL — ABNORMAL HIGH (ref 0.44–1.00)
GFR, Estimated: 30 mL/min — ABNORMAL LOW (ref >60)
Glucose, Bld: 378 mg/dL — ABNORMAL HIGH (ref 70–99)
Potassium: 5.6 mmol/L — ABNORMAL HIGH (ref 3.5–5.1)
Sodium: 131 mmol/L — ABNORMAL LOW (ref 135–145)
Total Bilirubin: 0.9 mg/dL (ref 0.3–1.2)
Total Protein: 7.1 g/dL (ref 6.5–8.1)

## 2023-06-16 LAB — RENAL FUNCTION PANEL
Albumin: 2.9 g/dL — ABNORMAL LOW (ref 3.5–5.0)
Anion gap: 10 (ref 5–15)
BUN: 72 mg/dL — ABNORMAL HIGH (ref 8–23)
CO2: 27 mmol/L (ref 22–32)
Calcium: 8.6 mg/dL — ABNORMAL LOW (ref 8.9–10.3)
Chloride: 93 mmol/L — ABNORMAL LOW (ref 98–111)
Creatinine, Ser: 1.85 mg/dL — ABNORMAL HIGH (ref 0.44–1.00)
GFR, Estimated: 30 mL/min — ABNORMAL LOW (ref >60)
Glucose, Bld: 310 mg/dL — ABNORMAL HIGH (ref 70–99)
Phosphorus: 4 mg/dL (ref 2.5–4.6)
Potassium: 5.6 mmol/L — ABNORMAL HIGH (ref 3.5–5.1)
Sodium: 130 mmol/L — ABNORMAL LOW (ref 135–145)

## 2023-06-16 LAB — BASIC METABOLIC PANEL
Anion gap: 8 (ref 5–15)
BUN: 62 mg/dL — ABNORMAL HIGH (ref 8–23)
CO2: 29 mmol/L (ref 22–32)
Calcium: 8.7 mg/dL — ABNORMAL LOW (ref 8.9–10.3)
Chloride: 95 mmol/L — ABNORMAL LOW (ref 98–111)
Creatinine, Ser: 1.84 mg/dL — ABNORMAL HIGH (ref 0.44–1.00)
GFR, Estimated: 30 mL/min — ABNORMAL LOW (ref >60)
Glucose, Bld: 389 mg/dL — ABNORMAL HIGH (ref 70–99)
Potassium: 5.4 mmol/L — ABNORMAL HIGH (ref 3.5–5.1)
Sodium: 132 mmol/L — ABNORMAL LOW (ref 135–145)

## 2023-06-16 LAB — PROCALCITONIN: Procalcitonin: <0.1 ng/mL

## 2023-06-16 LAB — CBG MONITORING, ED
Glucose-Capillary: 349 mg/dL — ABNORMAL HIGH (ref 70–99)
Glucose-Capillary: 306 mg/dL — ABNORMAL HIGH (ref 70–99)
Glucose-Capillary: 306 mg/dL — ABNORMAL HIGH (ref 70–99)
Glucose-Capillary: 296 mg/dL — ABNORMAL HIGH (ref 70–99)

## 2023-06-16 LAB — CBC
HCT: 30.4 % — ABNORMAL LOW (ref 36.0–46.0)
Hemoglobin: 9.3 g/dL — ABNORMAL LOW (ref 12.0–15.0)
MCH: 26.1 pg (ref 26.0–34.0)
MCHC: 30.6 g/dL (ref 30.0–36.0)
MCV: 85.2 fL (ref 80.0–100.0)
Platelets: 281 10*3/uL (ref 150–400)
RBC: 3.57 MIL/uL — ABNORMAL LOW (ref 3.87–5.11)
RDW: 15.2 % (ref 11.5–15.5)
WBC: 9 10*3/uL (ref 4.0–10.5)
nRBC: 0 % (ref 0.0–0.2)

## 2023-06-16 LAB — POTASSIUM
Potassium: 5.4 mmol/L — ABNORMAL HIGH (ref 3.5–5.1)
Potassium: 6 mmol/L — ABNORMAL HIGH (ref 3.5–5.1)

## 2023-06-16 MED ORDER — ENOXAPARIN SODIUM 30 MG/0.3ML IJ SOSY
30.0000 mg | PREFILLED_SYRINGE | INTRAMUSCULAR | Status: DC
  Administered 2023-06-16 – 2023-06-19 (×4): 30 mg via SUBCUTANEOUS
  Filled 2023-06-16 (×4): qty 0.3

## 2023-06-16 MED ORDER — BUDESONIDE 0.25 MG/2ML IN SUSP
0.2500 mg | Freq: Two times a day (BID) | RESPIRATORY_TRACT | Status: DC
  Administered 2023-06-16 – 2023-06-25 (×18): 0.25 mg via RESPIRATORY_TRACT
  Filled 2023-06-16 (×18): qty 2

## 2023-06-16 MED ORDER — ENOXAPARIN SODIUM 30 MG/0.3ML IJ SOSY: 30.0000 mg | PREFILLED_SYRINGE | INTRAMUSCULAR | Status: DC

## 2023-06-16 MED ORDER — GUAIFENESIN 100 MG/5ML PO LIQD
5.0000 mL | ORAL | Status: DC | PRN
  Administered 2023-06-16 – 2023-06-25 (×7): 5 mL via ORAL
  Filled 2023-06-16 (×8): qty 10

## 2023-06-16 MED ORDER — ATORVASTATIN CALCIUM 80 MG PO TABS
80.0000 mg | ORAL_TABLET | Freq: Every day | ORAL | Status: DC
  Administered 2023-06-16 – 2023-06-28 (×13): 80 mg via ORAL
  Filled 2023-06-16 (×4): qty 1
  Filled 2023-06-16: qty 2
  Filled 2023-06-16: qty 1
  Filled 2023-06-16: qty 2
  Filled 2023-06-16 (×6): qty 1

## 2023-06-16 MED ORDER — ARFORMOTEROL TARTRATE 15 MCG/2ML IN NEBU
15.0000 ug | INHALATION_SOLUTION | Freq: Two times a day (BID) | RESPIRATORY_TRACT | Status: DC
  Administered 2023-06-16 – 2023-06-28 (×24): 15 ug via RESPIRATORY_TRACT
  Filled 2023-06-16 (×23): qty 2

## 2023-06-16 MED ORDER — METOLAZONE 5 MG PO TABS
5.0000 mg | ORAL_TABLET | Freq: Once | ORAL | Status: AC
  Administered 2023-06-16: 5 mg via ORAL
  Filled 2023-06-16: qty 1

## 2023-06-16 MED ORDER — EZETIMIBE 10 MG PO TABS
10.0000 mg | ORAL_TABLET | Freq: Every day | ORAL | Status: DC
  Administered 2023-06-16 – 2023-06-28 (×13): 10 mg via ORAL
  Filled 2023-06-16 (×13): qty 1

## 2023-06-16 MED ORDER — HYDROXYZINE HCL 50 MG/ML IM SOLN
25.0000 mg | Freq: Four times a day (QID) | INTRAMUSCULAR | Status: DC | PRN
  Administered 2023-06-16: 25 mg via INTRAMUSCULAR
  Filled 2023-06-16 (×2): qty 0.5

## 2023-06-16 MED ORDER — BUDESONIDE 0.25 MG/2ML IN SUSP: 0.2500 mg | Freq: Two times a day (BID) | RESPIRATORY_TRACT | Status: DC

## 2023-06-16 MED ORDER — FUROSEMIDE 10 MG/ML IJ SOLN
80.0000 mg | Freq: Once | INTRAMUSCULAR | Status: AC
  Administered 2023-06-16: 80 mg via INTRAVENOUS
  Filled 2023-06-16: qty 8

## 2023-06-16 MED ORDER — INSULIN ASPART 100 UNIT/ML IJ SOLN
0.0000 [IU] | Freq: Every day | INTRAMUSCULAR | Status: DC
  Administered 2023-06-16: 3 [IU] via SUBCUTANEOUS
  Administered 2023-06-17: 4 [IU] via SUBCUTANEOUS
  Administered 2023-06-18: 5 [IU] via SUBCUTANEOUS
  Administered 2023-06-20 – 2023-06-26 (×3): 2 [IU] via SUBCUTANEOUS

## 2023-06-16 MED ORDER — INSULIN ASPART 100 UNIT/ML IV SOLN
5.0000 [IU] | Freq: Once | INTRAVENOUS | Status: AC
  Administered 2023-06-16: 5 [IU] via INTRAVENOUS

## 2023-06-16 MED ORDER — FUROSEMIDE 10 MG/ML IJ SOLN
120.0000 mg | Freq: Four times a day (QID) | INTRAVENOUS | Status: AC
  Administered 2023-06-16 (×2): 120 mg via INTRAVENOUS
  Filled 2023-06-16: qty 10
  Filled 2023-06-16: qty 2

## 2023-06-16 MED ORDER — IPRATROPIUM BROMIDE 0.02 % IN SOLN
0.5000 mg | Freq: Four times a day (QID) | RESPIRATORY_TRACT | Status: DC
  Administered 2023-06-16 – 2023-06-17 (×3): 0.5 mg via RESPIRATORY_TRACT
  Filled 2023-06-16 (×3): qty 2.5

## 2023-06-16 MED ORDER — PERFLUTREN LIPID MICROSPHERE
1.0000 mL | INTRAVENOUS | Status: AC | PRN
  Administered 2023-06-16: 2 mL via INTRAVENOUS

## 2023-06-16 MED ORDER — ARFORMOTEROL TARTRATE 15 MCG/2ML IN NEBU: 15.0000 ug | INHALATION_SOLUTION | Freq: Two times a day (BID) | RESPIRATORY_TRACT | Status: DC

## 2023-06-16 MED ORDER — INSULIN ASPART 100 UNIT/ML IJ SOLN
0.0000 [IU] | Freq: Three times a day (TID) | INTRAMUSCULAR | Status: DC
  Administered 2023-06-16 (×2): 7 [IU] via SUBCUTANEOUS

## 2023-06-16 MED ORDER — IPRATROPIUM-ALBUTEROL 0.5-2.5 (3) MG/3ML IN SOLN
3.0000 mL | Freq: Four times a day (QID) | RESPIRATORY_TRACT | Status: DC
  Administered 2023-06-16 – 2023-06-17 (×2): 3 mL via RESPIRATORY_TRACT
  Filled 2023-06-16 (×2): qty 3

## 2023-06-16 MED ORDER — SODIUM CHLORIDE 0.9 % IV SOLN
3.0000 g | Freq: Four times a day (QID) | INTRAVENOUS | Status: DC
  Administered 2023-06-16 – 2023-06-17 (×3): 3 g via INTRAVENOUS
  Filled 2023-06-16 (×4): qty 8

## 2023-06-16 MED ORDER — ENOXAPARIN SODIUM 40 MG/0.4ML IJ SOSY: 40.0000 mg | PREFILLED_SYRINGE | INTRAMUSCULAR | Status: DC

## 2023-06-16 NOTE — Consult Note (Addendum)
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 suspicious for interstitial edema. Question trace bilateral pleural effusions. Mild bibasilar opacities/atelectasis identified. There is no evidence of pneumothorax or acute bony abnormality. IMPRESSION: 1. Cardiomegaly with pulmonary vascular congestion and probable interstitial edema. 2. Question trace bilateral pleural effusions. 3. Mild bibasilar opacities, likely atelectasis. Electronically Signed   By: Harmon Pier M.D.   On: 06/15/2023 17:45     Assessment and Plan:   Acute respiratory failure/acute on chronic diastolic heart failure/COPD on chronic home  oxygen -BNP 3700 however clinically with acute kidney injury, decreased urine output, bilateral pulmonary infiltrates which may be suggestive of viral pneumonia.  Recent echocardiogram in April showed normal ejection fraction with grade 2 diastolic dysfunction.  This could be a viral pneumonia with worsening respiratory status.  Currently on BiPAP.  In mild to moderate distress. - I would not be opposed to trialing a bolus of IV fluids.  Clinically she does not appear to be volume overloaded.  This is very challenging to assess especially with what appears to be a  discrepant BNP.  Her torsemide at home usually works well. -On the other hand, a large dose of IV Lasix may not be a bad idea as well to see if this does help with her pulmonary infiltrates breathing state.  Discussed with nephrology.  Challenging situation. -No evidence of hydronephrosis. -Nephrology consult  -We have been avoiding beta-blockers due to sinus node dysfunction.  Currently appears to be in a junctional rhythm.  No pacemaker warranted. -Procalcitonin pending. -Getting treated for COPD exacerbation as well.  We will continue to follow along.  CRITICAL CARE Performed by: Donato Schultz   Total critical care time: 40 minutes  Critical care time was exclusive of separately billable procedures and treating other patients.  Critical care was necessary to treat or prevent imminent or life-threatening deterioration.  Critical care was time spent personally by me on the following activities: development of treatment plan with patient and/or surrogate as well as nursing, discussions with consultants, evaluation of patient's response to treatment, examination of patient, obtaining history from patient or surrogate, ordering and performing treatments and interventions, ordering and review of laboratory studies, ordering and review of radiographic studies, pulse oximetry and re-evaluation of patient's condition.    Risk  Assessment/Risk Scores:       New York Heart Association (NYHA) Functional Class NYHA Class IV        For questions or updates, please contact [facility] Please consult [email or web address] for contact info under    Signed, Donato Schultz, MD  06/16/2023 8:26 AM

## 2023-06-16 NOTE — Progress Notes (Signed)
PT Cancellation Note  Patient Details Name: Alicia [name] MRN: [id number] DOB: 11-Oct-1956   Cancelled Treatment:    Reason Eval/Treat Not Completed: Medical issues which prohibited therapy. Pt currently on BiPAP, PT will follow up when pt's respiratory status is more stable.   Arlyss Gandy 06/16/2023, 9:09 AM

## 2023-06-16 NOTE — Consult Note (Addendum)
Nephrology Consult   Requesting provider: Reymundo Poll Service requesting consult: Internal Medicine Reason for consult: AKI, concern for CHF exacerbation   Assessment/Recommendations: Alicia [name] is a/an 67 y.o. female with a past medical history COPD, HFpEF, DM2, CAD, HTN who present w/ acute on chronic hypoxic respiratory failure  Acute on chronic hypoxic respiratory failure: COPD at baseline.  Sounds like she may have had a virus that caused exacerbation of her COPD -Supplemental oxygen and possible COPD exacerbation management per primary team -Diuresis as below  Acute CHF exacerbation: Volume status hard to define based on the patient's exam but given the totality of evidence I do think she has some degree of volume overload.  Poor response to Lasix at this time -Increase IV Lasix to 120 mg 3 times daily and start metolazone 5 mg -Continue to monitor urine output closely -Appreciate cardiology involvement  Oliguric AKI: Given concern for volume overload above most likely cardiorenal but hard to say definitively.  May have some degree of tubular dysfunction related to acute illness -diuretics as above -Continue to monitor daily Cr, Dose meds for GFR -Monitor Daily I/Os, Daily weight  -Maintain MAP>65 for optimal renal perfusion.  -Avoid nephrotoxic medications including NSAIDs -Use synthetic opioids (Fentanyl/Dilaudid) if needed -Currently no indication for HD  Hyperkalemia: Secondary to AKI.  Should improve with diuretics.  Consider Lokelma if needed  Type 2 diabetes uncontrolled with hyperglycemia: Management per primary team  Hypertension: Diuresis as above.  Holding some home blood pressure medications including Entresto and spironolactone.  On amlodipine   Recommendations conveyed to primary service.    Darnell Level [facility] 06/16/2023 11:29 AM   _____________________________________________________________________________________ CC:  shortness of breath  History of Present Illness: Alicia [name] is a/an 67 y.o. female with a past medical history of COPD, HFpEF, DM2, CAD, HTN who presents with shortness of breath  The patient presented to the hospital yesterday for worsening shortness of breath.  She states that for the past 5 days or so she has noticed some shortness of breath particularly with exertion.  No orthopnea that she has noticed.  Patient has also had some URI symptoms including runny nose and cough.  Her cough is productive of some clear sputum.  However, she says that the sputum color will change based on what she eats.  She does feel like for the past week her abdomen has gotten very big and she was worried she was accumulating fluid.  She states in the past when she accumulated fluid she mainly collected it in her abdomen.  She normally wears oxygen at home but her requirement has increased.  She also notes some nausea and vomiting but states this has been going on for a long time.  She has had a hard time eating.  She also has a sense that she is not able to empty her bladder.  In the emergency apartment she was noted to have low oxygen saturations.  She was placed on BiPAP.  BNP was elevated.  Creatinine was also elevated to 1.6.  She received multiple doses of IV Lasix overnight with minimal improvement.  Chest x-ray was concerning for pulmonary vascular congestion  This morning she states she does feel better on BiPAP.  Denies fevers, chills, chest pain, dysuria, hematuria.  No NSAID use at home.   Medications:  Current Facility-Administered Medications  Medication Dose Route Frequency Provider Last Rate Last Admin   amLODipine (NORVASC) tablet 10 mg  10 mg Oral Daily August Saucer,  Irving Burton, DO       Ampicillin-Sulbactam (UNASYN) 3 g in sodium chloride 0.9 % 100 mL IVPB  3 g Intravenous Q6H Katsaros, Marcia Brash, RPH       aspirin chewable tablet 81 mg  81 mg Oral Daily Champ Mungo, DO       atorvastatin (LIPITOR) tablet  80 mg  80 mg Oral Daily Alexander-Savino, [place], MD       enoxaparin (LOVENOX) injection 30 mg  30 mg Subcutaneous Q24H Reymundo Poll, MD       ezetimibe (ZETIA) tablet 10 mg  10 mg Oral Daily Alexander-Savino, [place], MD       furosemide (LASIX) 120 mg in dextrose 5 % 50 mL IVPB  120 mg Intravenous Q6H Darnell Level, MD       hydrOXYzine (VISTARIL) injection 25 mg  25 mg Intramuscular Q6H PRN Champ Mungo, DO   25 mg at 06/16/23 0258   insulin aspart (novoLOG) injection 0-5 Units  0-5 Units Subcutaneous QHS Morene Crocker, MD       insulin aspart (novoLOG) injection 0-9 Units  0-9 Units Subcutaneous TID WC Morene Crocker, MD       insulin glargine-yfgn (SEMGLEE) injection 20 Units  20 Units Subcutaneous Daily Champ Mungo, DO       ipratropium-albuterol (DUONEB) 0.5-2.5 (3) MG/3ML nebulizer solution 3 mL  3 mL Nebulization Q6H Champ Mungo, DO   3 mL at 06/16/23 0740   menthol-cetylpyridinium (CEPACOL) lozenge 3 mg  1 lozenge Oral PRN Champ Mungo, DO       mometasone-formoterol (DULERA) 100-5 MCG/ACT inhaler 2 puff  2 puff Inhalation BID Champ Mungo, DO       perflutren lipid microspheres (DEFINITY) IV suspension  1-10 mL Intravenous PRN Reymundo Poll, MD   2 mL at 06/16/23 1120   predniSONE (DELTASONE) tablet 40 mg  40 mg Oral Q breakfast Champ Mungo, DO   40 mg at 06/16/23 0844   senna (SENOKOT) tablet 17.2 mg  2 tablet Oral Daily Champ Mungo, DO       trimethoprim-polymyxin b (POLYTRIM) ophthalmic solution 1 drop  1 drop Both Eyes Q4H Champ Mungo, DO       Current Outpatient Medications  Medication Sig Dispense Refill   trimethoprim-polymyxin b (POLYTRIM) ophthalmic solution Place 1 drop into both eyes every 4 (four) hours.     ACCU-CHEK AVIVA PLUS test strip USE TO TEST BLOOD SUGARS AS DIRECTED (Patient taking differently: 1 each by Other route See admin instructions. USE TO TEST BLOOD SUGARS AS DIRECTED) 100 strip 12   albuterol (VENTOLIN HFA) 108 (90 Base)  MCG/ACT inhaler TAKE 2 PUFFS BY MOUTH EVERY 6 HOURS AS NEEDED 18 each 5   amLODipine (NORVASC) 10 MG tablet TAKE 1 TABLET BY MOUTH EVERY DAY 90 tablet 3   aspirin 81 MG tablet Take 81 mg by mouth daily.     atorvastatin (LIPITOR) 80 MG tablet TAKE 1 TABLET BY MOUTH EVERY DAY 90 tablet 3   B-D UF III MINI PEN NEEDLES 31G X 5 MM MISC USE 3 TIMES A DAY WITH HUMALOG 100 each 5   Dulaglutide (TRULICITY) 1.5 MG/0.5ML SOPN Inject 1.5 mg into the skin once a week. 2 mL 5   ezetimibe (ZETIA) 10 MG tablet Take 1 tablet (10 mg total) by mouth daily. 90 tablet 3   fluconazole (DIFLUCAN) 150 MG tablet Take 1 tablet (150 mg total) by mouth daily. 1 tablet 0   insulin glargine (LANTUS) 100 UNIT/ML Solostar Pen Inject 20  Units into the skin daily. 15 mL 3   insulin lispro (HUMALOG) 100 UNIT/ML KwikPen Inject 14 units before breakfast and lunch, inject 20 units before dinner 15 mL 11   Insulin Pen Needle 32G X 4 MM MISC Use to inject insulin 4 times a day. The patient is insulin requiring, ICD 10 code 11.10. The patient injects 4 times per day. 400 each 3   Insulin Syringe-Needle U-100 31G X 15/64" 0.3 ML MISC Use to inject Humalog before meals three times a day 100 each 5   Lancets (ACCU-CHEK MULTICLIX) lancets Use to check your blood sugar four times daily: early morning, before a meal, two hours after a meal, and bedtime 100 each 12   mometasone-formoterol (DULERA) 100-5 MCG/ACT AERO Inhale 2 puffs into the lungs 2 (two) times daily. 13 g 3   nystatin powder Apply 1 Application topically 3 (three) times daily. 15 g 2   sacubitril-valsartan (ENTRESTO) 97-103 MG Take 1 tablet by mouth 2 (two) times daily. 180 tablet 3   senna (SENOKOT) 8.6 MG TABS tablet Take 2 tablets (17.2 mg total) by mouth daily. 120 tablet 2   spironolactone (ALDACTONE) 25 MG tablet Take 1 tablet (25 mg total) by mouth daily. 90 tablet 3   tiotropium (SPIRIVA HANDIHALER) 18 MCG inhalation capsule Place 1 capsule (18 mcg total) into inhaler  and inhale daily. 30 capsule 2   torsemide (DEMADEX) 20 MG tablet Take 3 tablets (60 mg total) by mouth daily. 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  Non-functioning   Aortic atherosclerosis (HCC) 09/24/2017   Asymptomatic, found on CT scan   Blood transfusion without reported diagnosis    Chronic Systolic Heart Failure 05/10/2014   Felt to be non-ischemic and secondary to hypertension.  Echo (05/28/2014): LVEF 25%.  Is not interested in AICD placement.   COPD exacerbation (HCC)    Coronary artery disease involving native coronary artery of native heart without angina pectoris 11/09/2014   Cardiac cath (02/18/2014): Non-obstructive, mRCA 30%, dRCA 60%   Cystocele with uterine prolapse - grade 3 02/12/2016   Not interested in pessary after trying   Diverticulosis of colon 09/24/2017   Diverticulosis of colon 09/24/2017   Essential hypertension 10/15/2013   Gastroesophageal reflux disease 09/24/2017   History of cerebrovascular accident 05/10/2013   Per patient report 6 previous strokes, most recent on 05/10/13.  No residual deficits.   History of cerebrovascular accident 05/10/2013   Per patient report 6 previous strokes, most recent on 05/10/13.  No residual deficits.   Hyperlipidemia    Normocytic anemia 02/09/2023   Overweight (BMI 25.0-29.9) 09/24/2017   Psoriasis 09/24/2017   Seasonal allergic rhinitis due to pollen 09/24/2017   Spring and early Fall   Small Bowel Obstruction (SBO) 01/13/2014   Ex-Lap & Lysis of Adhesion, 01/15/2014   Thyroid nodule 09/04/2019   Tobacco  use disorder 01/15/2014   Type 2 diabetes mellitus with moderate nonproliferative diabetic retinopathy (HCC) 10/15/2013     SOCIAL HISTORY Social History   Socioeconomic History   Marital status: Widowed  Spouse name: Not on file   Number of children: 3   Years of education: Not on file   Highest education level: High school graduate  Occupational History   Occupation: Disabled    Comment: Formerly worked in Dentist  Tobacco Use   Smoking status: Former    Current packs/day: 0.00    Average packs/day: 1 pack/day for 40.0 years (40.0 ttl pk-yrs)    Types: Cigarettes    Start date: 09/17/1979    Quit date: 09/17/2019    Years since quitting: 3.7   Smokeless tobacco: Never  Vaping Use   Vaping status: Never Used  Substance and Sexual Activity   Alcohol use: No    Alcohol/week: 0.0 standard drinks of alcohol   Drug use: No   Sexual activity: Not Currently    Birth control/protection: Post-menopausal  Other Topics Concern   Not on file  Social History Narrative   Current Social History 02/25/2021        Patient lives with family in a home which is 2 stories. There are steps up to the entrance the patient uses with handrails.       Patient's method of transportation is personal car.      The highest level of education was some high school.      The patient currently disabled.      Identified important Relationships are "Family, my daughter and grandkids"       Pets : My dog       Interests / Fun: "sleeping, working in my flowerbed        Current Stressors: The war in [place]       Social Determinants of Health   Financial Resource Strain: Low Risk  (05/19/2023)   Overall Financial Resource Strain (CARDIA)    Difficulty of Paying Living Expenses: Not hard at all  Food Insecurity: No Food Insecurity (05/19/2023)   Hunger Vital Sign    Worried About Running Out of Food in the Last Year: Never true    Ran Out of Food in the Last Year: Never true   Transportation Needs: No Transportation Needs (05/19/2023)   PRAPARE - Administrator, Civil Service (Medical): No    Lack of Transportation (Non-Medical): No  Physical Activity: Inactive (05/19/2023)   Exercise Vital Sign    Days of Exercise per Week: 0 days    Minutes of Exercise per Session: 0 min  Stress: No Stress Concern Present (05/19/2023)   [employer] of Occupational Health - Occupational Stress Questionnaire    Feeling of Stress : Not at all  Social Connections: Socially Isolated (05/19/2023)   Social Connection and Isolation Panel [NHANES]    Frequency of Communication with Friends and Family: More than three times a week    Frequency of Social Gatherings with Friends and Family: Never    Attends Religious Services: Never    Database administrator or Organizations: No    Attends Banker Meetings: Never    Marital Status: Widowed  Intimate Partner Violence: Not At Risk (05/19/2023)   Humiliation, Afraid, Rape, and Kick questionnaire    Fear of Current or Ex-Partner: No    Emotionally Abused: No    Physically Abused: No    Sexually Abused: No     FAMILY HISTORY Family History  Problem Relation Age of Onset   Diabetes Mellitus II Mother    Hypertension Mother    Cerebrovascular Accident Mother 25  Massive, resulted in death   Pulmonary embolism Father 55       Resulted in sudden death   Heart failure Brother    Obesity Brother    Coronary artery disease Brother    Obesity Brother    COPD Brother    Diabetes Mellitus II Brother    Healthy Brother    Healthy Brother    Healthy Daughter    Healthy Son    Healthy Son       Review of Systems: 12 systems reviewed Otherwise as per HPI, all other systems reviewed and negative  Physical Exam: Vitals:   06/16/23 0907 06/16/23 1000  BP:  (!) 160/62  Pulse:  (!) 56  Resp:  (!) 22  Temp: 98.1 F (36.7 C)   SpO2:  100%   Total I/O In: -  Out: 150 [Urine:150]  Intake/Output  Summary (Last 24 hours) at 06/16/2023 1129 Last data filed at 06/16/2023 9562 Gross per 24 hour  Intake --  Output 150 ml  Net -150 ml   General: Sitting up in bed, no distress HEENT: anicteric sclera, oropharynx clear without lesions CV: regular rate, no audible murmur, JVP hard to ascertain Lungs: Coarse crackles bilateral particularly at the left base Abd: soft, non-tender, moderately distant ended Skin: no visible lesions or rashes Psych: alert, engaged, appropriate mood and affect Musculoskeletal: no obvious deformities Neuro: normal speech, no gross focal deficits   Test Results Reviewed Lab Results  Component Value Date   NA 132 (L) 06/16/2023   K 5.4 (H) 06/16/2023   CL 95 (L) 06/16/2023   CO2 29 06/16/2023   BUN 62 (H) 06/16/2023   CREATININE 1.84 (H) 06/16/2023   GFR 110.83 03/27/2015   CALCIUM 8.7 (L) 06/16/2023   ALBUMIN 3.0 (L) 06/16/2023    CBC Recent Labs  Lab 06/15/23 1659 06/15/23 1711 06/16/23 0400  WBC 9.8  --  9.0  NEUTROABS 8.8*  --   --   HGB 9.2* 10.2* 9.3*  HCT 30.5* 30.0* 30.4*  MCV 85.9  --  85.2  PLT 255  --  281    I have reviewed all relevant outside healthcare records related to the patient's current hospitalization   The patient is critically ill with acute hypoxic respiratory failure and AKI and which includes my role to primarily manage AKI.  This requires high complexity decision making.  Total critical care time: 39  Critical care time was exclusive of treating other patients.   Critical care was necessary to treat or prevent imminent or life-threatening deterioration.   Critical care was time spent personally by me on the following activities:   development of treatment plan with patient and/or surrogate as well as nursing,   discussions with other provider evaluation of patient's response to treatment  examination of patient  obtaining history from patient or surrogate  ordering and performing treatments and  interventions  ordering and review of laboratory studies  ordering and review of radiographic studies

## 2023-06-16 NOTE — Progress Notes (Signed)
Echocardiogram 2D Echocardiogram has been performed.  Alicia [name] 06/16/2023, 11:20 AM

## 2023-06-16 NOTE — ED Notes (Signed)
Help get patient straighten up in the bed placed a pad patient is resting with call bell in reach

## 2023-06-16 NOTE — Progress Notes (Signed)
Pharmacy Antibiotic Note  Alicia [name] is a 67 y.o. female admitted on 06/15/2023 with COPD exacerbation and otitis media.  Pharmacy has been consulted for Unasyn dosing. Switching patient from Augmentin to Unasyn per MD request due to NPO status.   7/24: Patient having worsening SOB with cough and mild mucus production. Patient's renal function is also worsening, SCr 1.83 today (BL 0.8-0.9).   Plan: Start Unasyn  3 gms IV every 6 hours Monitor vital signs and renal function daily F/u abx LOT  Height: 5\' 7"  (170.2 cm) Weight: 98 kg (216 lb) IBW/kg (Calculated) : 61.6  Temp (24hrs), Avg:97.9 F (36.6 C), Min:97.5 F (36.4 C), Max:98.5 F (36.9 C)  Recent Labs  Lab 06/15/23 1659 06/16/23 0400  WBC 9.8 9.0  CREATININE 1.63* 1.83*    Estimated Creatinine Clearance: 36.9 mL/min (A) (by C-G formula based on SCr of 1.83 mg/dL (H)).    Allergies  Allergen Reactions   Beta Adrenergic Blockers Other (See Comments)    Sinus pauses   Canagliflozin Itching, Anxiety and Palpitations    Antimicrobials this admission: Augment 7/23  x1 Unasyn 7/24 >>   Microbiology results: 7/23 Respiratory Panel PCR: negative  Thank you for allowing pharmacy to be a part of this patient's care.  Enos Fling, PharmD PGY-1 Acute Care Pharmacy Resident 06/16/2023 10:19 AM

## 2023-06-16 NOTE — Progress Notes (Addendum)
Subjective:  Elveta Rape Ridling is a 67 y.o. female with a PMHx significant for COPD, HFpEF, T2DM, CAD, HTN, GERD who was admitted for acute hypoxic respiratory failure likely multifactorial 2/2 decompensated heart failure, possible viral PNA complicated by COPD exacerbation.  Pt was examined at bedside today. Evaluation somewhat limited given pt on BiPAP, but largely able to understand pt's responses. She reports she is breathing okay on BiPAP, though she does cough when asked to take deep breaths, but otherwise is not coughing at baseline. She denies urinating since yesterday, prior to coming to hospital. Pt has received 2 doses of IV lasix 60 and 3 doses of IV Lasix 80 since coming to the hospital. She thinks she might have had a fever; her daughter has been sick at home. Pt reports that, when she is in a fluid overloaded state, she usually notices her feet are edematous; she denies current pedal edema. She denies N/V, denies chest pain. She continues to report R ear pain.  Discussed code status with pt; her preference is for Full Code, with her daughter Sunny Schlein to make medical decisions for her if pt unable to.  Objective:  Vital signs in last 24 hours: Vitals:   06/16/23 0500 06/16/23 0700 06/16/23 0740 06/16/23 0907  BP: (!) 148/49 (!) 161/66    Pulse: (!) 51 (!) 58 (!) 52   Resp: 18 (!) 21 20   Temp:    98.1 F (36.7 C)  TempSrc:      SpO2: 100% 100% 100%   Weight:      Height:       Weight change:   Intake/Output Summary (Last 24 hours) at 06/16/2023 1005 Last data filed at 06/16/2023 1610 Gross per 24 hour  Intake --  Output 150 ml  Net -150 ml   Physical Exam General: Pt is lying in bed with head of bed elevated. No acute distress. HEENT: Bulging R tympanic membrane with cloudy appearance, no drainage, area of erythema superiorly. Cardiovascular: RRR, no murmurs, rubs, gallops. No JVD noted on exam. Pulmonary: Pt on BiPAP. Lungs with diminished breath sounds, wheezing. Pt  intermittently coughing when asked to take deep breaths. Abdomen: Protuberant abdomen. Normal bowel sounds. Soft. No tenderness to palpation; no rebound tenderness, no guarding. MSK: No edema noted of bilateral lower extremities; warm, dry. Neuro/Psych: Alert and oriented to person, place, event. (Time not formerly assessed.) Normal affect. Responding to questions appropriately.     Latest Ref Rng & Units 06/16/2023    4:00 AM 06/15/2023    5:11 PM 06/15/2023    4:59 PM  CMP  Glucose 70 - 99 mg/dL 960   454   BUN 8 - 23 mg/dL 56   45   Creatinine 0.98 - 1.00 mg/dL 1.19   1.47   Sodium 829 - 145 mmol/L 131  132  134   Potassium 3.5 - 5.1 mmol/L 5.6  5.1  5.1   Chloride 98 - 111 mmol/L 92   91   CO2 22 - 32 mmol/L 26   28   Calcium 8.9 - 10.3 mg/dL 8.8   8.7   Total Protein 6.5 - 8.1 g/dL 7.1   6.9   Total Bilirubin 0.3 - 1.2 mg/dL 0.9   1.0   Alkaline Phos 38 - 126 U/L 89   87   AST 15 - 41 U/L 14   16   ALT 0 - 44 U/L 20   20       Latest Ref  Rng & Units 06/16/2023    4:00 AM 06/15/2023    5:11 PM 06/15/2023    4:59 PM  CBC  WBC 4.0 - 10.5 K/uL 9.0   9.8   Hemoglobin 12.0 - 15.0 g/dL 9.3  69.6  9.2   Hematocrit 36.0 - 46.0 % 30.4  30.0  30.5   Platelets 150 - 400 K/uL 281   255    Resp Panel 7/23: Negative  CXR 7/23: IMPRESSION: 1. Cardiomegaly with pulmonary vascular congestion and probable interstitial edema. 2. Question trace bilateral pleural effusions. 3. Mild bibasilar opacities, likely atelectasis.  US Renal 7/24: IMPRESSION: No acute findings. No hydronephrosis.  Pending: Procalcitonin, Urinalysis, Respiratory Viral Panel  Assessment/Plan:  Principal Problem:   Acute on chronic hypoxic respiratory failure (HCC) Active Problems:   COPD (chronic obstructive pulmonary disease) (HCC)   Chronic respiratory failure with hypoxia, on home oxygen therapy (HCC)   AKI (acute kidney injury) (HCC)   Acute decompensated heart failure (HCC)  Lauramae Kneisley Neuenfeldt is a 67 y.o.  female with a PMHx significant for COPD, HFpEF, T2DM, CAD, HTN, GERD who was admitted for acute on chronic hypoxic respiratory failure with multifactorial etiology.  Acute on chronic hypoxic respiratory failure - multifactorial Chronic respiratory failure with hypoxia, on home O2 therapy COPD vs HFpEF vs Acute Respiratory Illness Pt has a history of chronic respiratory failure in setting of HFpEF and COPD requiring up to 4L O2 at baseline. Pt presented with increasing O2 needs and desaturations. CXR done 7/23 showed evidence of pulmonary congestion likely in setting of pulmonary edema, although consolidation could not be ruled out. Up until this am (7/24 am), pt has intermittently trialed BiPAP with poor compliance; however, for past few hours, pt has remained on BiPAP comfortably. Etiology of pt's acute on chronic hypoxic respiratory failure is suspect decompensated HF and / or COPD exacerbation with recent viral illness. Viral etiology possible in setting of pt-reported URI symptoms; initial COVID / flu PCR were negative, full RVP pending. Bacetrial infection less likely with low procal. But she is on IV unasyn for her acute otitis media. Although CXR and BNP are concerning for heart failure exacerbation, she does not seem to have any right sided HF symptoms. Clinically not volume overloaded on exam today (7/24) with no BLE edema, no JVD, no abnormal lung sounds; though an elevated BNP is seen in heart failure, this can also be seen in COPD or infectious states. She has been treated with IV Lasix (360 mg so far) with oliguria. Will arrange for repeat echo to monitor current EF. Cardiology and Nephrology consulting. - Reoeat echo - O2 supplementation with BiPAP; will continue reassessing pt's need for this - Respiratory viral panel pending - Continue holding Entresto, spironolactone, Trulicity - Strict Is and Os - Order ABG, repeat CXR if pt unable to wean off BiPAP - Discsused with cardiology and  nephrology; appreciate recs. Plan for increased IV lasix dosing and close monitoring.    COPD with acute exacerbation  Recent Viral Illness  Pt with a history of COPD requiring 4L O2 at home at baseline; she reports recent increase in O2 requirement to 5L at home. Suspect COPD exacerbation given wheezing, increased O2 requirement; this is likely contributing to current acute hypoxic respiratory failure. Pt received solumedrol in ED on 7/23. She is now on Prednisone 40 mg daily for 5 day course of steroids, Duonebs, home Dulera. Exacerbation is likely due to infection; viral vs bacterial. Initial Resp Panl for Covid, Flu negative;  full Respiratory panel pending.  -F/u RVP - Prednisone 40 mg daily; day 2 of 4 - Duonebs q6h - Dulera 100-5 mcg/ACT 2 puffs BID - Throat lozenges prn for cough, sore throat - PT/OT to consult once pt able to tolerate  Oliguric AKI Hyperkalemia Baseline Cr around 0.9. Pt's Cr has been elevated in the hospital to 1.84 with BUN 62 and GFR 30 on labs done 7/24. Pt denies urinating since arriving at hospital, and reports prior to this having to push down on abdomen/bladder to start urinary stream. She was treated with IV Lasix (360 mg) without urine production; outpatient, she is on torsemide 60 mg daily. Renal US done 7/24 showed no acute findings, no hydronephrosis. Bladder scan showed 200 cc in bladder. Foley placed morning of 7/24 with 150 cc drained. Suspect AKI is prerenal, possibly in setting of reduced po intake during pt's illness with occasional vomiting. Could also be structural abnormality with pt's report of manually starting urination, though less likely with unconcerning Renal US. Unsure of intrarenal cause at this time given pt has not produced much urine for urinalysis; this is pending. Pt's K initially elevated to 5.6 at 0400 today (7/24). Treated with 5 units IV insulin. Repeat BMP at 0948 with K of 5.4. Given pt currently NPO, cannot given lokelma at this time.  Plan to diurese pt as stated above. - INCREASE IV Lasix 120 mg TID - Urinalysis pending - Monitor BMPs - Foley catheter inserted for strict I&O  Bradycardia Junctional Rhythm - new  Has history of AV nodal dysfunction and not on beta blockers for this reason. EKG today (7/24) showing junctional rhythm.  - Continue to avoid AV nodal blocking agents - Telemetry -Cards aware; no need for pacing currently    Acute Otitis Media Hx cerumen impaction, left ear impacted by cerumen today but has had a progression of ear discomfort since her URI. On exam appears to have an opaque bulging TM on the Right. Pt initially started on Augmentin 875-125 mg BID; will switch to Unasyn for IV administration given pt remains NPO on BiPAP and to improve oral bacterial coverage. - Unasyn 3 g at 200 mL/hr q6h  T2DM Home regimen includes Trulicity, Lantus and Humalog. Has been hyperglycemic up to 389 on 7/24 since arrival without evidence of acidosis. Pt will continue on basal insulin; SSI added. Consider removing basal insulin tomorrow if pt still NPO. - Semglee 20 units daily - SSI TID with meals and at bedtime - Continue CBG monitoring  Chronic Conditions: Normocytic anemia: Stable from prior. Hypertension: On amlodipine 10 mg daily outpatient. Continue Hyperlipidemia, CAD: On atorvastatin 80 mg daily, Zetia 10 mg daily, ASA 81 mg daily outpatient. Continue.  Code Status: Full Diet: NPO (except for ice chips, small sips with meds) IV Fluid: None DVT Prophylaxis: Lovenox   LOS: 1 day   Governor Rooks, Medical Student  06/16/2023, 10:05 AM

## 2023-06-16 NOTE — ED Notes (Signed)
Assumed care of patient.

## 2023-06-16 NOTE — Inpatient Diabetes Management (Signed)
Inpatient Diabetes Program Recommendations  AACE/ADA: New Consensus Statement on Inpatient Glycemic Control (2015)  Target Ranges:  Prepandial:   less than 140 mg/dL      Peak postprandial:   less than 180 mg/dL (1-2 hours)      Critically ill patients:  140 - 180 mg/dL   Lab Results  Component Value Date   GLUCAP 349 (H) 06/16/2023   HGBA1C 8.2 (A) 05/03/2023    Review of Glycemic Control  Latest Reference Range & Units 06/16/23 08:27  Glucose-Capillary 70 - 99 mg/dL 951 (H)  (H): Data is abnormally high  Diabetes history: DM2 Outpatient Diabetes medications:  Lantus 20 units every day Trulicity 1.5 mg weekly Humalog 14 units with BF and lunch and 20 units with dinner Current orders for Inpatient glycemic control:  Semglee 20 units Qam Prednisone 40 mg QD  Inpatient Diabetes Program Recommendations:    Please consider:  Novolog 0-15 units Q4H (NPO)  Will continue to follow while inpatient.  Thank you, Dulce Sellar, MSN, CDCES Diabetes Coordinator Inpatient Diabetes Program [phone] (team pager from 8a-5p)

## 2023-06-16 NOTE — Progress Notes (Signed)
OT Cancellation Note  Patient Details Name: Alicia [name] MRN: [id number] DOB: 12-09-1955   Cancelled Treatment:    Reason Eval/Treat Not Completed: Patient not medically ready (Pt on bipap.)  Evern Bio 06/16/2023, 10:21 AM Berna Spare, OTR/L Acute Rehabilitation Services Office: [phone]

## 2023-06-16 NOTE — Progress Notes (Signed)
   06/16/23 0107  BiPAP/CPAP/SIPAP  BiPAP/CPAP/SIPAP Pt Type Adult  Mask Type Full face mask   Placed pt. Back on bipap

## 2023-06-17 ENCOUNTER — Encounter (INDEPENDENT_AMBULATORY_CARE_PROVIDER_SITE_OTHER): Payer: 59 | Admitting: Ophthalmology

## 2023-06-17 DIAGNOSIS — I110 Hypertensive heart disease with heart failure: Secondary | ICD-10-CM

## 2023-06-17 DIAGNOSIS — J9621 Acute and chronic respiratory failure with hypoxia: Secondary | ICD-10-CM | POA: Diagnosis not present

## 2023-06-17 DIAGNOSIS — J441 Chronic obstructive pulmonary disease with (acute) exacerbation: Secondary | ICD-10-CM | POA: Diagnosis not present

## 2023-06-17 DIAGNOSIS — I5033 Acute on chronic diastolic (congestive) heart failure: Secondary | ICD-10-CM | POA: Diagnosis not present

## 2023-06-17 LAB — BASIC METABOLIC PANEL
Anion gap: 11 (ref 5–15)
BUN: 84 mg/dL — ABNORMAL HIGH (ref 8–23)
CO2: 29 mmol/L (ref 22–32)
Calcium: 8.7 mg/dL — ABNORMAL LOW (ref 8.9–10.3)
Chloride: 92 mmol/L — ABNORMAL LOW (ref 98–111)
Creatinine, Ser: 1.8 mg/dL — ABNORMAL HIGH (ref 0.44–1.00)
GFR, Estimated: 31 mL/min — ABNORMAL LOW (ref >60)
Glucose, Bld: 398 mg/dL — ABNORMAL HIGH (ref 70–99)
Potassium: 4.9 mmol/L (ref 3.5–5.1)
Sodium: 132 mmol/L — ABNORMAL LOW (ref 135–145)

## 2023-06-17 LAB — CBC
HCT: 28.1 % — ABNORMAL LOW (ref 36.0–46.0)
HCT: 30.8 % — ABNORMAL LOW (ref 36.0–46.0)
Hemoglobin: 8.7 g/dL — ABNORMAL LOW (ref 12.0–15.0)
Hemoglobin: 9.5 g/dL — ABNORMAL LOW (ref 12.0–15.0)
MCH: 26.8 pg (ref 26.0–34.0)
MCH: 26.2 pg (ref 26.0–34.0)
MCHC: 31 g/dL (ref 30.0–36.0)
MCHC: 30.8 g/dL (ref 30.0–36.0)
MCV: 84.8 fL (ref 80.0–100.0)
Platelets: 301 10*3/uL (ref 150–400)
Platelets: 315 10*3/uL (ref 150–400)
RBC: 3.25 MIL/uL — ABNORMAL LOW (ref 3.87–5.11)
RBC: 3.63 MIL/uL — ABNORMAL LOW (ref 3.87–5.11)
RDW: 15.1 % (ref 11.5–15.5)
RDW: 14.9 % (ref 11.5–15.5)
WBC: 13.5 10*3/uL — ABNORMAL HIGH (ref 4.0–10.5)
WBC: 12.6 10*3/uL — ABNORMAL HIGH (ref 4.0–10.5)
nRBC: 0.2 % (ref 0.0–0.2)
nRBC: 0.4 % — ABNORMAL HIGH (ref 0.0–0.2)

## 2023-06-17 LAB — RENAL FUNCTION PANEL
Albumin: 2.8 g/dL — ABNORMAL LOW (ref 3.5–5.0)
Albumin: 3 g/dL — ABNORMAL LOW (ref 3.5–5.0)
Anion gap: 10 (ref 5–15)
Anion gap: 13 (ref 5–15)
BUN: 81 mg/dL — ABNORMAL HIGH (ref 8–23)
BUN: 81 mg/dL — ABNORMAL HIGH (ref 8–23)
CO2: 31 mmol/L (ref 22–32)
CO2: 28 mmol/L (ref 22–32)
Calcium: 8.8 mg/dL — ABNORMAL LOW (ref 8.9–10.3)
Calcium: 8.9 mg/dL (ref 8.9–10.3)
Chloride: 92 mmol/L — ABNORMAL LOW (ref 98–111)
Chloride: 93 mmol/L — ABNORMAL LOW (ref 98–111)
Creatinine, Ser: 1.81 mg/dL — ABNORMAL HIGH (ref 0.44–1.00)
Creatinine, Ser: 1.74 mg/dL — ABNORMAL HIGH (ref 0.44–1.00)
GFR, Estimated: 31 mL/min — ABNORMAL LOW (ref >60)
GFR, Estimated: 32 mL/min — ABNORMAL LOW (ref >60)
Glucose, Bld: 263 mg/dL — ABNORMAL HIGH (ref 70–99)
Glucose, Bld: 279 mg/dL — ABNORMAL HIGH (ref 70–99)
Phosphorus: 4.4 mg/dL (ref 2.5–4.6)
Potassium: 5.3 mmol/L — ABNORMAL HIGH (ref 3.5–5.1)
Potassium: 5.4 mmol/L — ABNORMAL HIGH (ref 3.5–5.1)
Sodium: 133 mmol/L — ABNORMAL LOW (ref 135–145)
Sodium: 134 mmol/L — ABNORMAL LOW (ref 135–145)

## 2023-06-17 LAB — GLUCOSE, CAPILLARY
Glucose-Capillary: 390 mg/dL — ABNORMAL HIGH (ref 70–99)
Glucose-Capillary: 346 mg/dL — ABNORMAL HIGH (ref 70–99)

## 2023-06-17 LAB — POTASSIUM
Potassium: 5.1 mmol/L (ref 3.5–5.1)
Potassium: 5 mmol/L (ref 3.5–5.1)
Potassium: 5.1 mmol/L (ref 3.5–5.1)

## 2023-06-17 LAB — CBG MONITORING, ED
Glucose-Capillary: 220 mg/dL — ABNORMAL HIGH (ref 70–99)
Glucose-Capillary: 280 mg/dL — ABNORMAL HIGH (ref 70–99)

## 2023-06-17 LAB — MAGNESIUM: Magnesium: 2.6 mg/dL — ABNORMAL HIGH (ref 1.7–2.4)

## 2023-06-17 MED ORDER — METOLAZONE 5 MG PO TABS
5.0000 mg | ORAL_TABLET | Freq: Two times a day (BID) | ORAL | Status: DC
  Administered 2023-06-17 – 2023-06-19 (×6): 5 mg via ORAL
  Filled 2023-06-17 (×7): qty 1

## 2023-06-17 MED ORDER — SODIUM ZIRCONIUM CYCLOSILICATE 5 G PO PACK
5.0000 g | PACK | Freq: Once | ORAL | Status: AC
  Administered 2023-06-17: 5 g via ORAL
  Filled 2023-06-17: qty 1

## 2023-06-17 MED ORDER — IPRATROPIUM-ALBUTEROL 0.5-2.5 (3) MG/3ML IN SOLN
3.0000 mL | Freq: Two times a day (BID) | RESPIRATORY_TRACT | Status: DC
  Administered 2023-06-17 – 2023-06-19 (×6): 3 mL via RESPIRATORY_TRACT
  Filled 2023-06-17 (×6): qty 3

## 2023-06-17 MED ORDER — INSULIN ASPART 100 UNIT/ML IJ SOLN
0.0000 [IU] | Freq: Three times a day (TID) | INTRAMUSCULAR | Status: DC
  Administered 2023-06-17: 11 [IU] via SUBCUTANEOUS
  Administered 2023-06-17: 20 [IU] via SUBCUTANEOUS
  Administered 2023-06-18: 4 [IU] via SUBCUTANEOUS
  Administered 2023-06-18: 7 [IU] via SUBCUTANEOUS
  Administered 2023-06-18: 15 [IU] via SUBCUTANEOUS
  Administered 2023-06-19: 20 [IU] via SUBCUTANEOUS
  Administered 2023-06-19: 15 [IU] via SUBCUTANEOUS
  Administered 2023-06-19: 4 [IU] via SUBCUTANEOUS
  Administered 2023-06-20: 15 [IU] via SUBCUTANEOUS
  Administered 2023-06-20 – 2023-06-21 (×3): 7 [IU] via SUBCUTANEOUS
  Administered 2023-06-21: 4 [IU] via SUBCUTANEOUS
  Administered 2023-06-22: 7 [IU] via SUBCUTANEOUS
  Administered 2023-06-22: 4 [IU] via SUBCUTANEOUS
  Administered 2023-06-22: 7 [IU] via SUBCUTANEOUS
  Administered 2023-06-23 (×2): 4 [IU] via SUBCUTANEOUS
  Administered 2023-06-23: 3 [IU] via SUBCUTANEOUS
  Administered 2023-06-24: 4 [IU] via SUBCUTANEOUS
  Administered 2023-06-24: 3 [IU] via SUBCUTANEOUS
  Administered 2023-06-24 – 2023-06-25 (×3): 4 [IU] via SUBCUTANEOUS
  Administered 2023-06-26 (×2): 11 [IU] via SUBCUTANEOUS
  Administered 2023-06-26: 15 [IU] via SUBCUTANEOUS
  Administered 2023-06-27: 4 [IU] via SUBCUTANEOUS
  Administered 2023-06-27: 7 [IU] via SUBCUTANEOUS
  Administered 2023-06-27: 4 [IU] via SUBCUTANEOUS
  Administered 2023-06-28 (×2): 7 [IU] via SUBCUTANEOUS
  Administered 2023-06-28: 11 [IU] via SUBCUTANEOUS

## 2023-06-17 MED ORDER — FUROSEMIDE 10 MG/ML IJ SOLN
120.0000 mg | Freq: Three times a day (TID) | INTRAVENOUS | Status: DC
  Administered 2023-06-17 – 2023-06-20 (×10): 120 mg via INTRAVENOUS
  Filled 2023-06-17: qty 10
  Filled 2023-06-17: qty 12
  Filled 2023-06-17 (×3): qty 10
  Filled 2023-06-17 (×3): qty 12
  Filled 2023-06-17 (×2): qty 10
  Filled 2023-06-17: qty 2
  Filled 2023-06-17: qty 10
  Filled 2023-06-17: qty 12
  Filled 2023-06-17: qty 10

## 2023-06-17 MED ORDER — CHLORHEXIDINE GLUCONATE CLOTH 2 % EX PADS
6.0000 | MEDICATED_PAD | Freq: Every day | CUTANEOUS | Status: DC
  Administered 2023-06-17 – 2023-06-21 (×5): 6 via TOPICAL

## 2023-06-17 MED ORDER — INSULIN ASPART 100 UNIT/ML IJ SOLN
6.0000 [IU] | Freq: Three times a day (TID) | INTRAMUSCULAR | Status: DC
  Administered 2023-06-17: 6 [IU] via SUBCUTANEOUS

## 2023-06-17 MED ORDER — INSULIN GLARGINE-YFGN 100 UNIT/ML ~~LOC~~ SOLN
25.0000 [IU] | Freq: Every day | SUBCUTANEOUS | Status: DC
  Administered 2023-06-18: 25 [IU] via SUBCUTANEOUS
  Filled 2023-06-17 (×2): qty 0.25

## 2023-06-17 MED ORDER — AMOXICILLIN-POT CLAVULANATE 875-125 MG PO TABS
1.0000 | ORAL_TABLET | Freq: Two times a day (BID) | ORAL | Status: AC
  Administered 2023-06-17 – 2023-06-19 (×6): 1 via ORAL
  Filled 2023-06-17 (×6): qty 1

## 2023-06-17 MED ORDER — IPRATROPIUM BROMIDE 0.02 % IN SOLN
0.5000 mg | Freq: Two times a day (BID) | RESPIRATORY_TRACT | Status: DC
  Administered 2023-06-17: 0.5 mg via RESPIRATORY_TRACT
  Filled 2023-06-17: qty 2.5

## 2023-06-17 MED ORDER — SODIUM ZIRCONIUM CYCLOSILICATE 5 G PO PACK: 5.0000 g | PACK | Freq: Every day | ORAL | Status: DC

## 2023-06-17 MED ORDER — INSULIN ASPART 100 UNIT/ML IJ SOLN
10.0000 [IU] | Freq: Three times a day (TID) | INTRAMUSCULAR | Status: DC
  Administered 2023-06-18 (×3): 10 [IU] via SUBCUTANEOUS

## 2023-06-17 NOTE — Progress Notes (Signed)
Subjective:  Alicia [name] is a 67 y.o. female with a PMHx significant for COPD, HFpEF, T2DM, CAD, HTN, GERD who was admitted for acute hypoxic respiratory failure likely due to acute on chronic HFpEF exacerbation and acute COPD exacerbation. She is being evaluated for an AKI that presented with anuria.   Today, she reported doing well. Her breathing has improved and she stated that she feels better after urinating and removing the excess fluid. She denies chest pain, chest tightness, or difficulty inhaling/exhaling today.   Objective:  Vital signs in last 24 hours: Vitals:   06/17/23 0400 06/17/23 0500 06/17/23 0600 06/17/23 0700  BP: (!) 151/57 (!) 144/54 (!) 141/63 (!) 157/61  Pulse: (!) 55 (!) 51 (!) 50 (!) 52  Resp: 17 (!) 21 16 17   Temp:   97.9 F (36.6 C)   TempSrc:   Oral   SpO2: 97% 96% 96% 98%  Weight:      Height:       Physical Exam: General: NAD, sitting in bed Cardiac:RRR, no murmurs Pulmonary:inspiratory and expiratory wheezes on exam, normal work of breathing observed on 10L HFNC MSK:No pitting edema, no gross abnormalities  Skin:warm and dry Psych: normal mood and affect     Latest Ref Rng & Units 06/17/2023    5:00 AM 06/16/2023    4:00 AM 06/15/2023    5:11 PM  CBC  WBC 4.0 - 10.5 K/uL 13.5  9.0    Hemoglobin 12.0 - 15.0 g/dL 8.7  9.3  78.2   Hematocrit 36.0 - 46.0 % 28.1  30.4  30.0   Platelets 150 - 400 K/uL 301  281         Latest Ref Rng & Units 06/17/2023   12:00 PM 06/17/2023    9:17 AM 06/17/2023    4:50 AM  CMP  Glucose 70 - 99 mg/dL 956   213   BUN 8 - 23 mg/dL 81   81   Creatinine 0.86 - 1.00 mg/dL 5.78   4.69   Sodium 629 - 145 mmol/L 134   133   Potassium 3.5 - 5.1 mmol/L 5.4  5.1  5.3   Chloride 98 - 111 mmol/L 93   92   CO2 22 - 32 mmol/L 28   31   Calcium 8.9 - 10.3 mg/dL 8.9   8.8     Assessment/Plan:  Principal Problem:   Acute on chronic hypoxic respiratory failure (HCC) Active Problems:   COPD (chronic obstructive  pulmonary disease) (HCC)   Chronic respiratory failure with hypoxia, on home oxygen therapy (HCC)   AKI (acute kidney injury) (HCC)   Acute decompensated heart failure (HCC)  Acute on chronic hypoxic respiratory failure  Acute on chronic diastolic heart failure COPD exacerbation Acute hypoxic respiratory failure is likely do to a mix of fluid overload from HFpEF exacerbation and COPD exacerbation. Echocardiogram showed LVEF 55-60%, elevated left end diastolic pressure, and IVC dilation which is suggestive of fluid overload. On exam today, inspiratory and expiratory wheezes were auscultated which is likely due to a COPD exacerbation likely caused from the recent viral URI, even thought the viral panel was negative. Oxygen demands improving, BiPAP--> 10L HFNC -Continue IV lasix per nephrology  -2.1 L output yesterday  -Continue following with cardiology's recommendations -Continue to treat COPD exacerbation with Brovana, pulmicort, robitussin, Augmentin, duonebs, and prednisone.    AKI Anuric:Resolved Patient had a 2.1L urine output after receiving lasix 120mg  IV q6. Her urinalysis showed small amount of hemoglobin  and 100mg /dl of protein. The foley catheter insertion is the likely cause of the hemoglobinuria. The past medical history of T2DM is likely the cause of the proteinuria that appears to be chronic on past lab work. Low suspicion for glomerulonephritis due to other more likely causes of her hemoglobinuria and proteinuria. Possibly due to cardiorenal syndrome due to the improvement in creatinine after administering high dose lasix.  -Serum Cr improved to 1.74, previously peaked at 1.85  -Continue to trend  -Continue to monitor urine output -Continue to follow nephrology recs   T2DM Patient's glucose continues to be elevated on CBG.  -Could be due to steroids -Will increase basal to 25 units -Will adjust meal insulin to: Novolog 6 units TID with meals for meal coverage if patient  eats at least 50% of meals.   Bradycardia with Junctional Rhythm Cardiology consulted, will follow their recs -Avoid AV blocking agents    Hyperkalemia: Most recent potassium was 5.4 -Will begin Lowkelma  -Continue q4 hour checks   Leukocytosis: WBC up trended to 13.5: most likely due to steroids, will monitor   Acute Otitis Media: Augmentin q12, day 3/5  Normocytic anemia: Stable from prior. Hypertension: amlodipine 10 mg daily  Hyperlipidemia, WUJ:[id number] 80 mg daily, Zetia 10 mg daily, ASA 81 mg daily   Prior to Admission Living Arrangement: home Anticipated Discharge Location: home Barriers to Discharge:Medical therapy Dispo: Anticipated discharge in approximately 2-3 day(s).   Faith Rogue, DO 06/17/2023, 7:13 AM Pager: [phone] After 5pm on weekdays and 1pm on weekends: On Call pager [phone]

## 2023-06-17 NOTE — Progress Notes (Signed)
Nephrology Follow-Up Consult note   Assessment/Recommendations: Alicia [name] is a/an 67 y.o. female with a past medical history significant for COPD, HFpEF, DM2, CAD, HTN who present w/ acute on chronic hypoxic respiratory failure   Acute on chronic hypoxic respiratory failure: COPD at baseline.  Sounds like she may have had a virus that caused exacerbation of her COPD but respiratory panel negative -Supplemental oxygen and possible COPD exacerbation management per primary team -Diuresis as below   Acute CHF exacerbation: Volume status hard to define based on the patient's exam but given the totality of evidence I do think she has some degree of volume overload. Agree with cardiology and primary team that this is hard to say definitively  However, she does seem to be symptomatically responding to diurese. Finally making more urine -Increase IV Lasix to 120 mg 3 times daily and use metolazone 5mg  BID -Continue to monitor urine output closely -Appreciate cardiology involvement   Non-oliguric AKI: Given concern for volume overload above most likely cardiorenal but hard to say definitively.  May have some degree of tubular dysfunction related to acute illness -diuretics as above -overall stable today with slight rise in BUN -Continue to monitor daily Cr, Dose meds for GFR -Monitor Daily I/Os, Daily weight  -Maintain MAP>65 for optimal renal perfusion.  -Avoid nephrotoxic medications including NSAIDs -Use synthetic opioids (Fentanyl/Dilaudid) if needed -Currently no indication for HD   Hyperkalemia: Secondary to AKI.  Should improve with diuretics.  Can use lokelma if needed   Type 2 diabetes uncontrolled with hyperglycemia: Management per primary team   Hypertension: Diuresis as above.  Holding some home blood pressure medications including Entresto and spironolactone.  On amlodipine   Recommendations conveyed to primary service.    Darnell Level [facility] 06/17/2023 10:30 AM  ___________________________________________________________  CC:  shortness of breath  Interval History/Subjective: Patient states she is starting to feel better.  Feels as though her breathing has significantly improved.  Also feels like her abdomen is not quite so tense.  Denies significant nausea or vomiting.  No chest pain.   Medications:  Current Facility-Administered Medications  Medication Dose Route Frequency Provider Last Rate Last Admin   amLODipine (NORVASC) tablet 10 mg  10 mg Oral Daily Champ Mungo, DO   10 mg at 06/16/23 1234   amoxicillin-clavulanate (AUGMENTIN) 875-125 MG per tablet 1 tablet  1 tablet Oral Q12H Bender, Emily, DO       arformoterol (BROVANA) nebulizer solution 15 mcg  15 mcg Nebulization BID Reymundo Poll, MD   15 mcg at 06/17/23 0818   aspirin chewable tablet 81 mg  81 mg Oral Daily Champ Mungo, DO   81 mg at 06/16/23 1231   atorvastatin (LIPITOR) tablet 80 mg  80 mg Oral Daily Alexander-Savino, [place], MD   80 mg at 06/16/23 1238   budesonide (PULMICORT) nebulizer solution 0.25 mg  0.25 mg Nebulization BID Reymundo Poll, MD   0.25 mg at 06/17/23 0817   enoxaparin (LOVENOX) injection 30 mg  30 mg Subcutaneous Q24H Reymundo Poll, MD   30 mg at 06/16/23 1514   ezetimibe (ZETIA) tablet 10 mg  10 mg Oral Daily Alexander-Savino, [place], MD   10 mg at 06/16/23 1238   furosemide (LASIX) 120 mg in dextrose 5 % 50 mL IVPB  120 mg Intravenous TID Darnell Level, MD       guaiFENesin (ROBITUSSIN) 100 MG/5ML liquid 5 mL  5 mL Oral Q4H PRN Reymundo Poll, MD   5  mL at 06/16/23 1917   hydrOXYzine (VISTARIL) injection 25 mg  25 mg Intramuscular Q6H PRN Champ Mungo, DO   25 mg at 06/16/23 0258   insulin aspart (novoLOG) injection 0-20 Units  0-20 Units Subcutaneous TID WC Bender, Emily, DO       insulin aspart (novoLOG) injection 0-5 Units  0-5 Units Subcutaneous QHS Morene Crocker, MD   3 Units at 06/16/23 2332    insulin glargine-yfgn (SEMGLEE) injection 20 Units  20 Units Subcutaneous Daily Champ Mungo, DO   20 Units at 06/16/23 1248   ipratropium (ATROVENT) nebulizer solution 0.5 mg  0.5 mg Nebulization BID Reymundo Poll, MD   0.5 mg at 06/17/23 0757   ipratropium-albuterol (DUONEB) 0.5-2.5 (3) MG/3ML nebulizer solution 3 mL  3 mL Nebulization BID Reymundo Poll, MD   3 mL at 06/17/23 0757   menthol-cetylpyridinium (CEPACOL) lozenge 3 mg  1 lozenge Oral PRN Champ Mungo, DO       metolazone (ZAROXOLYN) tablet 5 mg  5 mg Oral BID Darnell Level, MD   5 mg at 06/17/23 0819   predniSONE (DELTASONE) tablet 40 mg  40 mg Oral Q breakfast Champ Mungo, DO   40 mg at 06/17/23 4098   senna (SENOKOT) tablet 17.2 mg  2 tablet Oral Daily Champ Mungo, DO   17.2 mg at 06/16/23 1237   trimethoprim-polymyxin b (POLYTRIM) ophthalmic solution 1 drop  1 drop Both Eyes Q4H Champ Mungo, DO       Current Outpatient Medications  Medication Sig Dispense Refill   albuterol (VENTOLIN HFA) 108 (90 Base) MCG/ACT inhaler TAKE 2 PUFFS BY MOUTH EVERY 6 HOURS AS NEEDED 18 each 5   amLODipine (NORVASC) 10 MG tablet TAKE 1 TABLET BY MOUTH EVERY DAY 90 tablet 3   aspirin 81 MG tablet Take 81 mg by mouth daily.     atorvastatin (LIPITOR) 80 MG tablet TAKE 1 TABLET BY MOUTH EVERY DAY 90 tablet 3   Dulaglutide (TRULICITY) 1.5 MG/0.5ML SOPN Inject 1.5 mg into the skin once a week. 2 mL 5   ezetimibe (ZETIA) 10 MG tablet Take 1 tablet (10 mg total) by mouth daily. 90 tablet 3   insulin glargine (LANTUS) 100 UNIT/ML Solostar Pen Inject 20 Units into the skin daily. 15 mL 3   insulin lispro (HUMALOG) 100 UNIT/ML KwikPen Inject 14 units before breakfast and lunch, inject 20 units before dinner 15 mL 11   mometasone-formoterol (DULERA) 100-5 MCG/ACT AERO Inhale 2 puffs into the lungs 2 (two) times daily. 13 g 3   nystatin powder Apply 1 Application topically 3 (three) times daily. 15 g 2   sacubitril-valsartan (ENTRESTO) 97-103 MG  Take 1 tablet by mouth 2 (two) times daily. 180 tablet 3   senna (SENOKOT) 8.6 MG TABS tablet Take 2 tablets (17.2 mg total) by mouth daily. 120 tablet 2   spironolactone (ALDACTONE) 25 MG tablet Take 1 tablet (25 mg total) by mouth daily. 90 tablet 3   tiotropium (SPIRIVA HANDIHALER) 18 MCG inhalation capsule Place 1 capsule (18 mcg total) into inhaler and inhale daily. 30 capsule 2   torsemide (DEMADEX) 20 MG tablet Take 3 tablets (60 mg total) by mouth daily. 270 tablet 2   trimethoprim-polymyxin b (POLYTRIM) ophthalmic solution Place 1 drop into both eyes every 4 (four) hours.     Wound Dressings (MEDIHONEY WOUND/BURN DRESSING) GEL APPLY TOPICALLY DAILY * OTC ITEM (Patient taking differently: Apply 1 application  topically daily.) 15 mL 0   ACCU-CHEK AVIVA PLUS test  strip USE TO TEST BLOOD SUGARS AS DIRECTED (Patient taking differently: 1 each by Other route See admin instructions. USE TO TEST BLOOD SUGARS AS DIRECTED) 100 strip 12   B-D UF III MINI PEN NEEDLES 31G X 5 MM MISC USE 3 TIMES A DAY WITH HUMALOG 100 each 5   Insulin Pen Needle 32G X 4 MM MISC Use to inject insulin 4 times a day. The patient is insulin requiring, ICD 10 code 11.10. The patient injects 4 times per day. 400 each 3   Insulin Syringe-Needle U-100 31G X 15/64" 0.3 ML MISC Use to inject Humalog before meals three times a day 100 each 5   Lancets (ACCU-CHEK MULTICLIX) lancets Use to check your blood sugar four times daily: early morning, before a meal, two hours after a meal, and bedtime 100 each 12      Review of Systems: 10 systems reviewed and negative except per interval history/subjective  Physical Exam: Vitals:   06/17/23 0900 06/17/23 1000  BP: (!) 149/66 (!) 143/51  Pulse: (!) 57 61  Resp: 18 (!) 23  Temp:  97.8 F (36.6 C)  SpO2: 96% 96%   No intake/output data recorded.  Intake/Output Summary (Last 24 hours) at 06/17/2023 1030 Last data filed at 06/17/2023 6433 Gross per 24 hour  Intake --  Output  1950 ml  Net -1950 ml   Constitutional: Lying in bed, no acute distress ENMT: ears and nose without scars or lesions, MMM CV: normal rate, no edema Respiratory: Coarse bilateral sounds, mild expiratory wheezes, mild increased work of breathing on high flow nasal cannula Gastrointestinal: soft, non-tender, no palpable masses or hernias Skin: no visible lesions or rashes Psych: alert, judgement/insight appropriate, appropriate mood and affect   Test Results I personally reviewed new and old clinical labs and radiology tests Lab Results  Component Value Date   NA 133 (L) 06/17/2023   K 5.1 06/17/2023   CL 92 (L) 06/17/2023   CO2 31 06/17/2023   BUN 81 (H) 06/17/2023   CREATININE 1.81 (H) 06/17/2023   GFR 110.83 03/27/2015   CALCIUM 8.8 (L) 06/17/2023   ALBUMIN 2.8 (L) 06/17/2023   PHOS 4.7 (H) 06/17/2023    CBC Recent Labs  Lab 06/15/23 1659 06/15/23 1711 06/16/23 0400 06/17/23 0500  WBC 9.8  --  9.0 13.5*  NEUTROABS 8.8*  --   --   --   HGB 9.2* 10.2* 9.3* 8.7*  HCT 30.5* 30.0* 30.4* 28.1*  MCV 85.9  --  85.2 86.5  PLT 255  --  281 301

## 2023-06-17 NOTE — Progress Notes (Signed)
Physical Therapy Evaluation Patient Details Name: Alicia [name] MRN: [id number] DOB: 1955-12-08 Today's Date: 06/17/2023  History of Present Illness  67 y.o. female presents to [facility] hospital on 06/15/2023 with SOB, cough and ear pain. Pt required BiPAP in the ED due to hypoxia. Pt admitted for CHF and COPD exacerbation 2/2 URI. 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  Clinical Impression  Pt was seen for very limited eval due to her medical issues and maintaining O2 sats.  Her recommendation is to have <3 hours a day inpt care, at the time she is medically stable enough to handle it.  Her care is progressing as tolerated with PT and as permitted.  Follow along with her to gradually increase standing and gait as able, work toward PLOF.  WIll have only sporadic help at home.        Assistance Recommended at Discharge Frequent or constant Supervision/Assistance  If plan is discharge home, recommend the following:  Can travel by private vehicle  Two people to help with walking and/or transfers;A lot of help with bathing/dressing/bathroom;Assistance with cooking/housework;Assist for transportation;Help with stairs or ramp for entrance   No    Equipment Recommendations None recommended by PT  Recommendations for Other Services       Functional Status Assessment Patient has had a recent decline in their functional status and demonstrates the ability to make significant improvements in function in a reasonable and predictable amount of time.     Precautions / Restrictions Precautions Precautions: Fall Precaution Comments: monitor HR and sats Restrictions Weight Bearing Restrictions: No      Mobility  Bed Mobility Overal bed mobility: Needs Assistance Bed Mobility: Supine to Sit, Sit to Supine     Supine to sit: Min assist Sit to supine: Min assist   General bed mobility comments: dropped to 84%  with sitting activity    Transfers Overall transfer level: Needs assistance                 General transfer comment: intolerant due to desaturation on 10L    Ambulation/Gait               General Gait Details: deferred  Stairs            Wheelchair Mobility     Tilt Bed    Modified Rankin (Stroke Patients Only)       Balance Overall balance assessment: Needs assistance Sitting-balance support: Bilateral upper extremity supported Sitting balance-Leahy Scale: Fair                                       Pertinent Vitals/Pain Pain Assessment Pain Assessment: Faces Faces Pain Scale: No hurt    Home Living Family/patient expects to be discharged to:: Private residence Living Arrangements: Children Available Help at Discharge: Family;Available PRN/intermittently Type of Home: House Home Access: Level entry Entrance Stairs-Rails: Right;Left   Alternate Level Stairs-Number of Steps: 14 Home Layout: Two level;Bed/bath upstairs Home Equipment: Rollator (4 wheels);Cane - single point;Grab bars - tub/shower Additional Comments: daughter works and grandchildren in school, home alone    Prior Function Prior Level of Function : Independent/Modified Independent             Mobility Comments: walking with rollator previously ADLs Comments: able to self dress and has daughter for other household  tasks     Hand Dominance   Dominant Hand: Right    Extremity/Trunk Assessment   Upper Extremity Assessment Upper Extremity Assessment: Generalized weakness    Lower Extremity Assessment Lower Extremity Assessment: Generalized weakness    Cervical / Trunk Assessment Cervical / Trunk Assessment: Kyphotic  Communication   Communication: HOH  Cognition Arousal/Alertness: Awake/alert Behavior During Therapy: WFL for tasks assessed/performed Overall Cognitive Status: No family/caregiver present to determine baseline cognitive  functioning                                 General Comments: pt is confused about home based on previous chart information but acknowedges her need for help        General Comments General comments (skin integrity, edema, etc.): Pt is on O2 with HFNC and only sat up on side of bed.  Pt is not tolerant of more movement and discussed her lack of help at home.  Has agreed to a rehab placement to recover her safety and cardiopulm tolerance to move    Exercises     Assessment/Plan    PT Assessment Patient needs continued PT services  PT Problem List Cardiopulmonary status limiting activity;Decreased strength;Decreased activity tolerance;Decreased balance;Decreased mobility       PT Treatment Interventions DME instruction;Gait training    PT Goals (Current goals can be found in the Care Plan section)  Acute Rehab PT Goals Patient Stated Goal: to get her strength back PT Goal Formulation: With patient Time For Goal Achievement: 07/01/23 Potential to Achieve Goals: Good    Frequency Min 1X/week     Co-evaluation               AM-PAC PT "6 Clicks" Mobility  Outcome Measure Help needed turning from your back to your side while in a flat bed without using bedrails?: A Little Help needed moving from lying on your back to sitting on the side of a flat bed without using bedrails?: A Little Help needed moving to and from a bed to a chair (including a wheelchair)?: Total Help needed standing up from a chair using your arms (e.g., wheelchair or bedside chair)?: Total Help needed to walk in hospital room?: Total Help needed climbing 3-5 steps with a railing? : Total 6 Click Score: 10    End of Session Equipment Utilized During Treatment: Oxygen Activity Tolerance: Patient limited by fatigue;Treatment limited secondary to medical complications (Comment) Patient left: in bed;with call bell/phone within reach Nurse Communication: Mobility status;Other (comment) (need  for rehab) PT Visit Diagnosis: Muscle weakness (generalized) (M62.81);Difficulty in walking, not elsewhere classified (R26.2)    Time: 7829-5621 PT Time Calculation (min) (ACUTE ONLY): 18 min   Charges:   PT Evaluation $PT Eval Moderate Complexity: 1 Mod   PT General Charges $$ ACUTE PT VISIT: 1 Visit        Ivar Drape 06/17/2023, 1:07 PM  Samul Dada, PT PhD Acute Rehab Dept. Number: [facility] [id number] and [facility] [phone]

## 2023-06-17 NOTE — Evaluation (Signed)
Occupational Therapy Evaluation Patient Details Name: Alicia [name] MRN: [id number] DOB: 08-02-1956 Today's Date: 06/17/2023   History of Present Illness 67 y.o. female presents to [facility] hospital on 06/15/2023 with SOB, cough and ear pain. Pt required BiPAP in the ED due to hypoxia. Pt admitted for CHF and COPD exacerbation 2/2 URI. 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   Clinical Impression   Pt reports ind at baseline with ADLs and uses RW for mobility, lives in 2 level home with family and reports "crawling" up the stairs as she has to go upstairs for bed/bathroom. Pt currently needing set up -max A for seated ADLs, min A for bed mobility and min A for stand pivot transfer to [facility] with 1 person HHA. Pt on 10L O2 upon arrival, desatting to 79% at lowest after [facility] transfer, and needing up to 12L O2, increased to mid 90's after 1-2 mins with cues for PLB. Pt left on 11L O2 at end of session with RN present, pt satting low 90's. Pt presenting with impairments listed below, will follow acutely. At this time, patient will benefit from continued inpatient follow up therapy, <3 hours/day to maximize safety/ind with ADLs/functional mobility.       Recommendations for follow up therapy are one component of a multi-disciplinary discharge planning process, led by the attending physician.  Recommendations may be updated based on patient status, additional functional criteria and insurance authorization.   Assistance Recommended at Discharge Frequent or constant Supervision/Assistance  Patient can return home with the following A little help with walking and/or transfers;A lot of help with bathing/dressing/bathroom;Assistance with cooking/housework;Assist for transportation;Help with stairs or ramp for entrance    Functional Status Assessment  Patient has had a recent decline in their functional status and demonstrates the  ability to make significant improvements in function in a reasonable and predictable amount of time.  Equipment Recommendations  Tub/shower bench;BSC/3in1    Recommendations for Other Services PT consult     Precautions / Restrictions Precautions Precautions: Fall Precaution Comments: monitor HR and sats Restrictions Weight Bearing Restrictions: No      Mobility Bed Mobility Overal bed mobility: Needs Assistance Bed Mobility: Supine to Sit, Sit to Supine     Supine to sit: Min assist Sit to supine: Min assist   General bed mobility comments: down ot 85% on 10LO2, with cues for PLB and ~2 min returned to mid 90s    Transfers Overall transfer level: Needs assistance Equipment used: 1 person hand held assist Transfers: Sit to/from Stand, Bed to chair/wheelchair/BSC Sit to Stand: Min assist     Step pivot transfers: Min assist     General transfer comment: to [facility]      Balance Overall balance assessment: Needs assistance Sitting-balance support: Bilateral upper extremity supported Sitting balance-Leahy Scale: Fair     Standing balance support: Bilateral upper extremity supported, No upper extremity supported Standing balance-Leahy Scale: Poor Standing balance comment: reliant on external support                           ADL either performed or assessed with clinical judgement   ADL Overall ADL's : Needs assistance/impaired Eating/Feeding: Set up;Sitting Eating/Feeding Details (indicate cue type and reason): drinking from cup Grooming: Set up;Sitting;Bed level   Upper Body Bathing: Moderate assistance;Bed level;Sitting   Lower Body Bathing: Maximal assistance;Bed level   Upper  Body Dressing : Moderate assistance;Bed level;Sitting   Lower Body Dressing: Maximal assistance;Bed level;Sitting/lateral leans   Toilet Transfer: Minimal assistance;Stand-pivot;BSC/3in1;Requires wide/bariatric   Toileting- Clothing Manipulation and Hygiene: Min  guard Toileting - Clothing Manipulation Details (indicate cue type and reason): performs seated pericare     Functional mobility during ADLs: Minimal assistance       Vision   Additional Comments: disconjugate gaze     Perception Perception Perception Tested?: No   Praxis Praxis Praxis tested?: Not tested    Pertinent Vitals/Pain Pain Assessment Pain Assessment: No/denies pain     Hand Dominance Right   Extremity/Trunk Assessment Upper Extremity Assessment Upper Extremity Assessment: Generalized weakness   Lower Extremity Assessment Lower Extremity Assessment: Defer to PT evaluation   Cervical / Trunk Assessment Cervical / Trunk Assessment: Kyphotic   Communication Communication Communication: HOH   Cognition Arousal/Alertness: Awake/alert Behavior During Therapy: WFL for tasks assessed/performed Overall Cognitive Status: No family/caregiver present to determine baseline cognitive functioning                                 General Comments: pt thinking that closet door is bathroom door, overall provides appropriate PLOF     General Comments  SpO2 down to 85% on 10L O2, increased to mid 90's with PLB    Exercises     Shoulder Instructions      Home Living Family/patient expects to be discharged to:: Private residence Living Arrangements: Children Available Help at Discharge: Family;Available PRN/intermittently Type of Home: House Home Access: Level entry   Entrance Stairs-Rails: Right;Left Home Layout: Two level;Bed/bath upstairs Alternate Level Stairs-Number of Steps: 14 Alternate Level Stairs-Rails: Right Bathroom Shower/Tub: Chief Strategy Officer: Standard     Home Equipment: Rollator (4 wheels);Cane - single point;Grab bars - tub/shower   Additional Comments: daughter works and grandchildren in school, home alone      Prior Functioning/Environment Prior Level of Function : Independent/Modified Independent              Mobility Comments: crawls up the stairs at home, uses RW ADLs Comments: able to self dress and has daughter for other household tasks        OT Problem List: Decreased strength;Decreased range of motion;Decreased activity tolerance;Impaired balance (sitting and/or standing);Decreased safety awareness;Cardiopulmonary status limiting activity      OT Treatment/Interventions: Self-care/ADL training;Therapeutic exercise;Energy conservation;DME and/or AE instruction    OT Goals(Current goals can be found in the care plan section) Acute Rehab OT Goals Patient Stated Goal: to use BSC OT Goal Formulation: With patient Time For Goal Achievement: 07/01/23 Potential to Achieve Goals: Good ADL Goals Pt Will Perform Upper Body Dressing: with supervision;sitting Pt Will Perform Lower Body Dressing: with supervision;with adaptive equipment;sitting/lateral leans;sit to/from stand Pt Will Transfer to Toilet: with supervision;ambulating;regular height toilet Additional ADL Goal #1: pt will be able to complete 2 standing functional tasks with SpO2 at or above 95% in order to improve activity tolerance for ADLs Additional ADL Goal #2: pt will verbalize x3 energy conservation strategies in prep for ADLs  OT Frequency: Min 1X/week    Co-evaluation              AM-PAC OT "6 Clicks" Daily Activity     Outcome Measure Help from another person eating meals?: A Little Help from another person taking care of personal grooming?: A Little Help from another person toileting, which includes using toliet, bedpan, or urinal?:  A Little Help from another person bathing (including washing, rinsing, drying)?: A Lot Help from another person to put on and taking off regular upper body clothing?: A Lot Help from another person to put on and taking off regular lower body clothing?: A Lot 6 Click Score: 15   End of Session Equipment Utilized During Treatment: Oxygen (10L) Nurse Communication: Mobility  status; SpO2, pt left on 11L satting low 90's, pt can have ice water  Activity Tolerance: Patient tolerated treatment well Patient left: in bed;with call bell/phone within reach;with bed alarm set  OT Visit Diagnosis: Unsteadiness on feet (R26.81);Other abnormalities of gait and mobility (R26.89);Muscle weakness (generalized) (M62.81)                Time: 1610-9604 OT Time Calculation (min): 31 min Charges:  OT General Charges $OT Visit: 1 Visit OT Evaluation $OT Eval Moderate Complexity: 1 Mod OT Treatments $Self Care/Home Management : 8-22 mins  Carver Fila, OTD, OTR/L SecureChat Preferred Acute Rehab [phone]   Carver Fila Koonce 06/17/2023, 4:45 PM

## 2023-06-17 NOTE — Inpatient Diabetes Management (Signed)
Inpatient Diabetes Program Recommendations  AACE/ADA: New Consensus Statement on Inpatient Glycemic Control   Target Ranges:  Prepandial:   less than 140 mg/dL      Peak postprandial:   less than 180 mg/dL (1-2 hours)      Critically ill patients:  140 - 180 mg/dL    Latest Reference Range & Units 06/16/23 08:27 06/16/23 12:44 06/16/23 19:44 06/16/23 23:23 06/17/23 08:20 06/17/23 12:11  Glucose-Capillary 70 - 99 mg/dL 621 (H) 308 (H) 657 (H) 296 (H) 220 (H) 280 (H)   Review of Glycemic Control  Diabetes history: DM2 Outpatient Diabetes medications: Lantus 20 units daily, Humalog 14 units with breakfast, Humalog 14 units with lunch, Humalog 20 units with supper, Trulicity 1.5 mg Qweek Current orders for Inpatient glycemic control: Semglee 20 units daily, Novolog 0-20 units TID with meals, Novolog 0-5 units at bedtime; Prednisone 40 mg QAM  Inpatient Diabetes Program Recommendations:    Insulin: If steroids are continued, please consider increasing Semglee to 25 units daily and ordering Novolog 6 units TID with meals for meal coverage if patient eats at least 50% of meals.  Thanks, Orlando Penner, RN, MSN, CDCES Diabetes Coordinator Inpatient Diabetes Program [phone] (Team Pager from 8am to 5pm)

## 2023-06-17 NOTE — Progress Notes (Signed)
   Patient Name: Alicia [name] Date of Encounter: 06/17/2023 [facility] Cardiologist: Donato Schultz, MD   Interval Summary  .    Feeling better today.  Breathing has improved.  Excellent. She told me about her grandson who just graduated high school, Kinnie Scales, who is going into Psychologist, counselling at [employer]  Vital Signs .    Vitals:   06/17/23 0400 06/17/23 0500 06/17/23 0600 06/17/23 0700  BP: (!) 151/57 (!) 144/54 (!) 141/63 (!) 157/61  Pulse: (!) 55 (!) 51 (!) 50 (!) 52  Resp: 17 (!) 21 16 17   Temp:   97.9 F (36.6 C)   TempSrc:   Oral   SpO2: 97% 96% 96% 98%  Weight:      Height:        Intake/Output Summary (Last 24 hours) at 06/17/2023 0809 Last data filed at 06/17/2023 5366 Gross per 24 hour  Intake --  Output 2100 ml  Net -2100 ml      06/15/2023    4:31 PM 05/25/2023    9:21 AM 05/19/2023    9:39 AM  Last 3 Weights  Weight (lbs) 216 lb 216 lb 14.4 oz 219 lb  Weight (kg) 97.977 kg 98.385 kg 99.338 kg      Telemetry/ECG    No adverse arrhythmias- Personally Reviewed  Physical Exam .   GEN: No acute distress.   Neck: No JVD Cardiac: RRR, no murmurs, rubs, or gallops.  Respiratory: Improved bilaterally. GI: Soft, nontender, mildly distended MS: No significant lower extremity edema  Assessment & Plan .     Acute hypoxic respiratory failure Chronic diastolic heart failure COPD on home O2 Acute kidney injury - Definitely improved.  She has been given high-dose Lasix IV with metolazone and has successfully urinated 2 L yesterday. - Continues to be on 10 L high flow nasal cannula.  Chest x-ray yesterday bilateral pulmonary infiltrates.  She was also treated with steroids for COPD exacerbation - White count 13.5 elevated this morning likely secondary to steroid. - Appreciate Dr. Valentino Nose with nephrology's recommendations. It was the right decision to give her high-dose Lasix -Holding Entresto spironolactone. -Mild hyperkalemia noted.   Does not appear to need HD at this time. -At home seems to have a good response to torsemide.    For questions or updates, please contact [facility] Please consult [email or web address] for contact info under        Signed, Donato Schultz, MD

## 2023-06-17 NOTE — ED Notes (Signed)
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 is 45.5 mmHg. Left Atrium: Left atrial size was mild to moderately dilated. Right Atrium: Right atrial size was mild to moderately dilated. Pericardium: Trivial pericardial effusion is present. Mitral Valve: The mitral valve is grossly normal. Trivial mitral valve regurgitation. No evidence of mitral valve stenosis. Tricuspid Valve: The tricuspid valve is grossly normal. Tricuspid valve regurgitation is mild . No evidence of tricuspid stenosis. Aortic Valve:  The  aortic valve is tricuspid. Aortic valve regurgitation is not visualized. No aortic stenosis is present. Aortic valve peak gradient measures 11.2 mmHg. Pulmonic Valve: The pulmonic valve was not well visualized. Pulmonic valve regurgitation is not visualized. Aorta: The aortic root, ascending aorta, aortic arch and descending aorta are all structurally normal, with no evidence of dilitation or obstruction. Venous: The inferior vena cava is dilated in size with less than 50% respiratory variability, suggesting right atrial pressure of 15 mmHg. IAS/Shunts: The atrial septum is grossly normal.  LEFT VENTRICLE PLAX 2D LVIDd:         6.00 cm   Diastology LVIDs:         4.30 cm   LV e' medial:    5.26 cm/s LV PW:         1.10 cm   LV E/e' medial:  29.1 LV IVS:        1.40 cm   LV e' lateral:   6.40 cm/s LVOT diam:     2.20 cm   LV E/e' lateral: 23.9 LV SV:         81 LV SV Index:   39 LVOT Area:     3.80 cm  RIGHT VENTRICLE             IVC RV S prime:     16.30 cm/s  IVC diam: 2.50 cm TAPSE (M-mode): 1.9 cm LEFT ATRIUM             Index        RIGHT ATRIUM           Index LA diam:        4.70 cm 2.25 cm/m   RA Area:     19.80 cm LA Vol (A2C):   55.6 ml 26.61 ml/m  RA Volume:   59.60 ml  28.52 ml/m LA Vol (A4C):   72.9 ml 34.89 ml/m LA Biplane Vol: 69.5 ml 33.26 ml/m  AORTIC VALVE AV Area (Vmax): 2.53 cm AV Vmax:        167.00 cm/s AV Peak Grad:   11.2 mmHg LVOT Vmax:      111.00 cm/s LVOT Vmean:     72.500 cm/s LVOT VTI:       0.213 m  AORTA Ao Root diam: 3.00 cm Ao Asc diam:  3.40 cm MITRAL VALVE                TRICUSPID VALVE MV Area (PHT): 4.21 cm     TR Peak grad:   30.5 mmHg MV Decel Time: 180 msec     TR Vmax:        276.00 cm/s MR Peak grad: 43.0 mmHg MR Vmax:      328.00 cm/s   SHUNTS MV E velocity: 153.00 cm/s  Systemic VTI:  0.21 m MV A velocity: 41.30 cm/s   Systemic Diam: 2.20 cm MV E/A ratio:  3.70 Jodelle Red MD Electronically signed by Jodelle Red MD Signature Date/Time:  06/16/2023/1:05:38 PM    Final    US RENAL  Result Date: 06/16/2023 CLINICAL DATA:  Difficulty urinating EXAM: RENAL / URINARY TRACT ULTRASOUND COMPLETE COMPARISON:  None Available. FINDINGS: Right Kidney: Renal measurements: 10.3 x 5 x 5.3 cm = volume: 141 mL. Echogenicity within normal limits. No mass or hydronephrosis visualized. Left Kidney: Renal measurements: 11 x 4.7 x 4.7 cm = volume: 127 mL. Echogenicity within normal limits. No mass or hydronephrosis visualized. Bladder: Appears normal for degree of bladder distention. Other: None. IMPRESSION: No  acute findings.  No hydronephrosis. Electronically Signed   By: Charlett Nose M.D.   On: 06/16/2023 01:21   DG Chest Portable 1 View  Result Date: 06/15/2023 CLINICAL DATA:  Shortness of breath EXAM: PORTABLE CHEST 1 VIEW COMPARISON:  03/16/2023 and prior radiographs FINDINGS: Cardiomegaly with pulmonary vascular congestion noted. Bilateral interstitial opacities are present suspicious for interstitial edema. Question trace bilateral pleural effusions. Mild bibasilar opacities/atelectasis identified. There is no evidence of pneumothorax or acute bony abnormality. IMPRESSION: 1. Cardiomegaly with pulmonary vascular congestion and probable interstitial edema. 2. Question trace bilateral pleural effusions. 3. Mild bibasilar opacities, likely atelectasis. Electronically Signed   By: Harmon Pier M.D.   On: 06/15/2023 17:45    Pending Labs Unresulted Labs (From admission, onward)     Start     Ordered   06/17/23 1400  Basic metabolic panel  Once-Timed,   TIMED        06/17/23 0717   06/17/23 1200  Renal function panel  Once,   R        06/17/23 0831   06/16/23 0752  Potassium  STAT Now then every 4 hours ,   R (with STAT occurrences)     Comments: Until normal twice.    06/16/23 0754            Vitals/Pain Today's Vitals   06/17/23 0700 06/17/23 0800 06/17/23 0900 06/17/23 1000  BP: (!) 157/61 (!) 148/59 (!) 149/66 (!) 143/51  Pulse: (!)  52 (!) 55 (!) 57 61  Resp: 17 (!) 22 18 (!) 23  Temp:    97.8 F (36.6 C)  TempSrc:      SpO2: 98% 97% 96% 96%  Weight:      Height:      PainSc:        Isolation Precautions Droplet precaution  Medications Medications  amLODipine (NORVASC) tablet 10 mg (10 mg Oral Given 06/16/23 1234)  aspirin chewable tablet 81 mg (81 mg Oral Given 06/16/23 1231)  insulin glargine-yfgn (SEMGLEE) injection 20 Units (20 Units Subcutaneous Given 06/16/23 1248)  senna (SENOKOT) tablet 17.2 mg (17.2 mg Oral Given 06/16/23 1237)  menthol-cetylpyridinium (CEPACOL) lozenge 3 mg (has no administration in time range)  trimethoprim-polymyxin b (POLYTRIM) ophthalmic solution 1 drop (1 drop Both Eyes Not Given 06/17/23 0417)  predniSONE (DELTASONE) tablet 40 mg (40 mg Oral Given 06/17/23 0819)  hydrOXYzine (VISTARIL) injection 25 mg (25 mg Intramuscular Given 06/16/23 0258)  atorvastatin (LIPITOR) tablet 80 mg (80 mg Oral Given 06/16/23 1238)  ezetimibe (ZETIA) tablet 10 mg (10 mg Oral Given 06/16/23 1238)  enoxaparin (LOVENOX) injection 30 mg (30 mg Subcutaneous Given 06/16/23 1514)  insulin aspart (novoLOG) injection 0-5 Units (3 Units Subcutaneous Given 06/16/23 2332)  perflutren lipid microspheres (DEFINITY) IV suspension (2 mLs Intravenous Given 06/16/23 1120)  guaiFENesin (ROBITUSSIN) 100 MG/5ML liquid 5 mL (5 mLs Oral Given 06/16/23 1917)  arformoterol (BROVANA) nebulizer solution 15 mcg (15 mcg Nebulization Given 06/17/23 0818)  budesonide (PULMICORT) nebulizer solution 0.25 mg (0.25 mg Nebulization Given 06/17/23 0817)  ipratropium (ATROVENT) nebulizer solution 0.5 mg (0.5 mg Nebulization Given 06/17/23 0757)  ipratropium-albuterol (DUONEB) 0.5-2.5 (3) MG/3ML nebulizer solution 3 mL (3 mLs Nebulization Given 06/17/23 0757)  furosemide (LASIX) 120 mg in dextrose 5 % 50 mL IVPB (has no administration in time range)  metolazone (ZAROXOLYN) tablet 5 mg (5 mg Oral Given 06/17/23 0819)  insulin aspart (novoLOG) injection  0-20 Units (has no administration in time range)  amoxicillin-clavulanate (AUGMENTIN) 875-125 MG per tablet  1 tablet (has no administration in time range)  magnesium sulfate IVPB 2 g 50 mL (0 g Intravenous Stopped 06/15/23 1831)  methylPREDNISolone sodium succinate (SOLU-MEDROL) 125 mg/2 mL injection 125 mg (125 mg Intravenous Given 06/15/23 1709)  furosemide (LASIX) injection 60 mg (60 mg Intravenous Given 06/15/23 1936)  ipratropium-albuterol (DUONEB) 0.5-2.5 (3) MG/3ML nebulizer solution 3 mL (3 mLs Nebulization Given 06/15/23 2009)  furosemide (LASIX) injection 60 mg (60 mg Intravenous Given 06/15/23 2056)  insulin aspart (novoLOG) injection 5 Units (5 Units Intravenous Given 06/16/23 0858)  furosemide (LASIX) 120 mg in dextrose 5 % 50 mL IVPB (0 mg Intravenous Stopped 06/16/23 2246)  furosemide (LASIX) injection 80 mg (80 mg Intravenous Given 06/16/23 0845)  metolazone (ZAROXOLYN) tablet 5 mg (5 mg Oral Given 06/16/23 0844)    Mobility walks with device     Focused Assessments Pulmonary Assessment Handoff:  Lung sounds: Bilateral Breath Sounds: Diminished, Clear L Breath Sounds: Diminished R Breath Sounds: Diminished O2 Device: High Flow Nasal Cannula O2 Flow Rate (L/min): 10 L/min    R Recommendations: See Admitting Provider Note  Report given to:   Additional Notes:

## 2023-06-18 DIAGNOSIS — J9621 Acute and chronic respiratory failure with hypoxia: Secondary | ICD-10-CM | POA: Diagnosis not present

## 2023-06-18 DIAGNOSIS — I5033 Acute on chronic diastolic (congestive) heart failure: Secondary | ICD-10-CM | POA: Diagnosis not present

## 2023-06-18 DIAGNOSIS — I110 Hypertensive heart disease with heart failure: Secondary | ICD-10-CM | POA: Diagnosis not present

## 2023-06-18 DIAGNOSIS — J441 Chronic obstructive pulmonary disease with (acute) exacerbation: Secondary | ICD-10-CM | POA: Diagnosis not present

## 2023-06-18 LAB — GLUCOSE, CAPILLARY
Glucose-Capillary: 328 mg/dL — ABNORMAL HIGH (ref 70–99)
Glucose-Capillary: 189 mg/dL — ABNORMAL HIGH (ref 70–99)
Glucose-Capillary: 231 mg/dL — ABNORMAL HIGH (ref 70–99)
Glucose-Capillary: 486 mg/dL — ABNORMAL HIGH (ref 70–99)
Glucose-Capillary: 509 mg/dL (ref 70–99)

## 2023-06-18 LAB — RENAL FUNCTION PANEL
Albumin: 2.9 g/dL — ABNORMAL LOW (ref 3.5–5.0)
Anion gap: 12 (ref 5–15)
BUN: 82 mg/dL — ABNORMAL HIGH (ref 8–23)
CO2: 36 mmol/L — ABNORMAL HIGH (ref 22–32)
Calcium: 9.4 mg/dL (ref 8.9–10.3)
Chloride: 86 mmol/L — ABNORMAL LOW (ref 98–111)
Creatinine, Ser: 1.47 mg/dL — ABNORMAL HIGH (ref 0.44–1.00)
GFR, Estimated: 39 mL/min — ABNORMAL LOW (ref >60)
Glucose, Bld: 179 mg/dL — ABNORMAL HIGH (ref 70–99)
Phosphorus: 3.1 mg/dL (ref 2.5–4.6)
Potassium: 4.3 mmol/L (ref 3.5–5.1)
Sodium: 134 mmol/L — ABNORMAL LOW (ref 135–145)

## 2023-06-18 LAB — MAGNESIUM: Magnesium: 2.3 mg/dL (ref 1.7–2.4)

## 2023-06-18 MED ORDER — FUROSEMIDE 10 MG/ML IJ SOLN: 120.0000 mg | Freq: Three times a day (TID) | INTRAVENOUS | Status: DC

## 2023-06-18 MED ORDER — ALUM & MAG HYDROXIDE-SIMETH 200-200-20 MG/5ML PO SUSP
30.0000 mL | Freq: Once | ORAL | Status: AC
  Administered 2023-06-18: 30 mL via ORAL
  Filled 2023-06-18: qty 30

## 2023-06-18 NOTE — TOC Initial Note (Signed)
Transition of Care [facility] - Initial/Assessment Note    Patient Details  Name: Alicia [name] MRN: [id number] Date of Birth: [birth year]  Transition of Care [facility]) CM/SW Contact:    Leander Rams, LCSW Phone Number: 06/18/2023, 10:32 AM  Clinical Narrative:                 CSW met with pt at bedside. Pt reports she is from home with her daughter and grandchildren. CSW discussed the recommendation for SNF. Pt refused SNF and states she would like for them to come out to her home. CSW made CM aware.   TOC will continue to follow.   Expected Discharge Plan: Skilled Nursing Facility Barriers to Discharge: Continued Medical Work up   Patient Goals and CMS Choice Patient states their goals for this hospitalization and ongoing recovery are:: Pt states she would like to have help come out to her house.          Expected Discharge Plan and Services                                              Prior Living Arrangements/Services   Lives with:: Self, Adult Children, Relatives Patient language and need for interpreter reviewed:: Yes Do you feel safe going back to the place where you live?: Yes      Need for Family Participation in Patient Care: Yes (Comment) Care giver support system in place?: Yes (comment)   Criminal Activity/Legal Involvement Pertinent to Current Situation/Hospitalization: No - Comment as needed  Activities of Daily Living      Permission Sought/Granted                  Emotional Assessment Appearance:: Appears stated age, Developmentally appropriate Attitude/Demeanor/Rapport: Engaged, Ambitious Affect (typically observed): Happy Orientation: : Oriented to Self, Oriented to Place, Oriented to  Time, Oriented to Situation Alcohol / Substance Use: Not Applicable Psych Involvement: No (comment)  Admission diagnosis:  Acute respiratory failure with hypoxia (HCC) [J96.01] Acute decompensated heart failure (HCC) [I50.9] Patient Active Problem  List   Diagnosis Date Noted   Acute on chronic hypoxic respiratory failure (HCC) 06/16/2023   Acute decompensated heart failure (HCC) 06/15/2023   Candidal vulvovaginitis 05/26/2023   Abdominal wall skin ulcer (HCC) 04/14/2023   Hearing loss of right ear due to cerumen impaction 03/31/2023   AKI (acute kidney injury) (HCC) 02/16/2023   Encounter for screening for lung cancer 05/13/2020   Nocturnal hypoxemia 12/30/2018   Sinoatrial node dysfunction (HCC) 10/17/2018   Chronic respiratory failure with hypoxia, on home oxygen therapy (HCC) 10/14/2018   Aortic atherosclerosis (HCC) 09/24/2017   Gastroesophageal reflux disease 09/24/2017   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 09/24/2017   Seasonal allergic rhinitis due to pollen 09/24/2017   Healthcare maintenance 09/24/2017   Obesity (BMI 30.0-34.9) 09/24/2017   Hyperlipidemia    Uterine prolapse 02/12/2016   Coronary artery disease involving native coronary artery of native heart without angina pectoris 11/09/2014   COPD (chronic obstructive pulmonary disease) (HCC) 02/18/2014   Type 2 diabetes mellitus with moderate nonproliferative diabetic retinopathy (HCC) 10/15/2013   Essential hypertension 10/15/2013   PCP:  [name], MD Pharmacy:   [facility] [phone] [place], [place] - [address] [place] [address] Phone: [phone] Fax: [phone]  [facility] [address] [place] [address] Phone: [phone] Fax: [phone]     Social Determinants of Health (SDOH) Social History: SDOH Screenings   Food Insecurity: No Food Insecurity (05/19/2023)  Housing: Low Risk  (05/19/2023)  Transportation Needs: No Transportation Needs (05/19/2023)  Utilities: Not At Risk (05/19/2023)  Alcohol Screen: Low Risk  (05/19/2023)  Depression (PHQ2-9): Low Risk  (05/25/2023)  Financial Resource Strain: Low Risk  (05/19/2023)   Physical Activity: Inactive (05/19/2023)  Social Connections: Socially Isolated (05/19/2023)  Stress: No Stress Concern Present (05/19/2023)  Tobacco Use: Medium Risk (06/15/2023)   SDOH Interventions:     Readmission Risk Interventions     No data to display         Oletta Lamas, MSW, LCSWA, LCASA Transitions of Care  Clinical Social Worker I

## 2023-06-18 NOTE — Progress Notes (Signed)
Pt c/o chest palpitations that only lasted a few seconds and then resolved.  SB on monitor 58-60. Pt desat down to 88-89% on 8L HFNC, o2 increased to 9L HFNC. Pt denies SOB and chest pain. Provider on call paged and aware. States that no EKG is needed at this time, continue to monitor. Pt has no further concerns at this time, call bell within reach.   06/18/23 2200  Vitals  Temp 98.4 F (36.9 C)  Temp Source Oral  BP (!) 155/68  MAP (mmHg) 92  BP Location Left Arm  BP Method Automatic  Patient Position (if appropriate) Lying  Pulse Rate 61  Pulse Rate Source Monitor  ECG Heart Rate 62  Resp 19  Level of Consciousness  Level of Consciousness Alert  MEWS COLOR  MEWS Score Color Green  Oxygen Therapy  SpO2 90 %  O2 Device HFNC  O2 Flow Rate (L/min) 9 L/min  MEWS Score  MEWS Temp 0  MEWS Systolic 0  MEWS Pulse 0  MEWS RR 0  MEWS LOC 0  MEWS Score 0

## 2023-06-18 NOTE — Progress Notes (Signed)
   Heart Failure Stewardship Pharmacist Progress Note   PCP: [name], MD PCP-Cardiologist: Donato Schultz, MD    HPI:  67 yo F with PMH of COPD, oxygen dependent, CHF (EF 20-25% in 2015, recovered to 55-60%), CAD, HLD, GERD, T2DM, and CVA.    Admitted in Nov 2023 with acute on chronic CHF exacerbation. Diuresed w/ IV Lasix and treated w/ abx, steroids and nebs. Echo showed normal EF 55-60% and normal RV. After diureses, was transitioned to PO Lasix. Also placed on Entresto.   She was seen in HF TOC 10/2022. Jardiance increased to 10 mg daily and spironlactone added. She was seen back in [facility] 11/2022 and spironolactone was increased to 25 mg daily. She was later seen at [facility] on 1/30 and lasix was decreased to 80 mg AM and 40 mg PM.  Admitted 01/2023 with acute on chronic CHF exacerbation. ECHO 3/17 showed LVEF 60-65%. Diuresed with IV lasix 120 mg, metolazone, and diamox. Discharged on Eentresto, spironolactone, amlodipine, and lasix. [name] held due to UTI.   Presented to the ED on 7/23 with increasing O2 needs and desaturations. CXR showed pulmonary congestion. BNP elevated. ECHO 7/24 showed LVEF 50-60%, G3DD, no RWMA, RV normal, moderately elevated pA pressure.   Current HF Medications: Diuretic: furosemide 120 mg IV TID + metolazone 5 mg BID  Prior to admission HF Medications: Diuretic: torsemide 60 mg daily ACE/ARB/ARNI: Entresto 97/103 mg BID MRA: spironolactone 25 mg daily  Pertinent Lab Values: Serum creatinine 1.70, BUN 92, Potassium 4.1, Sodium 133, BNP 3750.1, Magnesium 2.3, A1c 8.2   Vital Signs: Weight: 220 lbs (admission weight: 221 lbs) Blood pressure: 150/60s  Heart rate: 50-60s  I/O: net -2.8L yesterday; net -4.4L since admission  Medication Assistance / Insurance Benefits Check: Does the patient have prescription insurance?  Yes Type of insurance plan: [place] Medicaid  Outpatient Pharmacy:  [facility] the [facility]  willing to use [facility] TOC pharmacy at discharge? Yes Is the patient willing to transition their outpatient pharmacy to utilize a [facility] outpatient pharmacy?   Pending    Assessment: 1. 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 NYHA class III symptoms. - Continue furosemide 120 mg IV TID and metolazone 5 mg BID. Strict I/Os and daily weights. Keep K>4 and Mg>2. - No BB with bradycardia and COPD  - Holding Entresto and spironolactone with AKI - creatinine slowly improving  - Held [place] last admission with UTI - consider restarting this admission if renal function stabillizes  Plan: 1) Medication changes recommended at this time: - Continue aggressive diuresis   2) Patient assistance: - None pending, has [place] Medicaid   3)  Education  - Patient has been educated on current HF medications and potential additions to HF medication regimen - Patient verbalizes understanding that over the next few months, these medication doses may change and more medications may be added to optimize HF regimen - Patient has been educated on basic disease state pathophysiology and goals of therapy   Sharen Hones, PharmD, BCPS Heart Failure Stewardship Pharmacist Phone [phone]

## 2023-06-18 NOTE — Progress Notes (Addendum)
Subjective:  Alicia [name] is a 67 y.o. female with a PMHx significant for COPD, HFpEF, T2DM, CAD, HTN, GERD who was admitted for acute hypoxic respiratory failure likely due to acute on chronic HFpEF exacerbation and acute COPD exacerbation. Currently being evaluated for AKI which presented with anuria (now resolved).  Pt was examined at bedside today. She reports she is feeling better overall today. She reports she is breathing better; she is currently needing 8L O2 through HFNC. She reports she is still needing to push on her right side to start her urinary stream; this has been ongoing since prior to her hospitalization.  Yesterday PT recommended SNF placement. She is adamant about NOT wanting to discharge to SNF.  Objective:  Vital signs in last 24 hours: Vitals:   06/18/23 0420 06/18/23 0500 06/18/23 0718 06/18/23 0823  BP: (!) 159/63  (!) 159/57   Pulse: (!) 54  (!) 57   Resp: 16  17   Temp: 97.9 F (36.6 C)  98 F (36.7 C)   TempSrc: Oral  Oral   SpO2: 94%  94% 91%  Weight:  99.8 kg    Height:       Weight change:   Intake/Output Summary (Last 24 hours) at 06/18/2023 0826 Last data filed at 06/18/2023 1914 Gross per 24 hour  Intake 1385.7 ml  Output 3750 ml  Net -2364.3 ml   Physical Exam General: Pt is lying in hospital bed with head of bed elevated, drinking coffee. No acute distress. Cardiovascular: RRR, no murmurs, rubs, gallops. Pulmonary: Leland present at 8L O2. Normal work of breathing, but when asked to take a deep breath or when emotionally upset, will have cough with desats down to low 80s, recovers quickly once calm. Expiratory wheezing auscultated.  Neuro/Psych: Alert and oriented to person, place, event. (Time not formerly assessed.) Normal affect.     Latest Ref Rng & Units 06/17/2023   11:20 PM 06/17/2023    7:32 PM 06/17/2023    3:49 PM  CMP  Glucose 70 - 99 mg/dL 782     BUN 8 - 23 mg/dL 92     Creatinine 9.56 - 1.00 mg/dL 2.13     Sodium 086 -  145 mmol/L 133     Potassium 3.5 - 5.1 mmol/L 4.8  5.1  5.0   Chloride 98 - 111 mmol/L 89     CO2 22 - 32 mmol/L 32     Calcium 8.9 - 10.3 mg/dL 9.1     Glucose 5/78: 469 K 7/26: 4.1  Mag 7/26: 2.3     Latest Ref Rng & Units 06/17/2023   11:20 PM 06/17/2023    5:00 AM 06/16/2023    4:00 AM  CBC  WBC 4.0 - 10.5 K/uL 12.6  13.5  9.0   Hemoglobin 12.0 - 15.0 g/dL 9.5  8.7  9.3   Hematocrit 36.0 - 46.0 % 30.8  28.1  30.4   Platelets 150 - 400 K/uL 315  301  281    Assessment/Plan:  Principal Problem:   Acute on chronic hypoxic respiratory failure (HCC) Active Problems:   COPD (chronic obstructive pulmonary disease) (HCC)   Chronic respiratory failure with hypoxia, on home oxygen therapy (HCC)   AKI (acute kidney injury) (HCC)   Acute decompensated heart failure (HCC)  Acute on chronic hypoxic respiratory failure  Acute on chronic diastolic heart failure COPD exacerbation Acute hypoxic respiratory failure is likely do to a mix of fluid overload from HFpEF exacerbation  and COPD exacerbation with suspected viral URI trigger despite negative viral panel. Echocardiogram done 06/16/23 showed LVEF 55-60%, elevated left end diastolic pressure, and IVC dilation which is suggestive of fluid overload. She has been able to reduce O2 needs to 8L HFNC this am. Pt improved on exam today with expiratory wheezing, no longer with inspiratory wheezing. - Continue IV Lasix 120 mg TID as per nephrology  - Total 2.25 L output yesterday - Continue metolazone 5 mg BID - Continue treatment of COPD exacerbation with triple therapy (Pulmicort, Ipratropium-albuterol, Brovana), prednisone burst (day 4/5 for steroid course), prn guaifenesin, Cepacol lozenges for cough - Continue following recommendations from cardiology   AKI, non oliguric  Potential cardiorenal component of AKI given pt has experienced improved kidney function with diuresis. Pt has continued to experience good urine output with diuresis; had  output of 3.7 L yesterday with net output of 2.4 L. 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 Suspect T2DM also cause of chronic proteinuria. Will continue diuresing with Lasix, metolazone. Can consider voiding trial once pt closer to discharge; will keep Foley in place for now. Appreciate nephrology's continued recommendations. - Serum Cr improved to 1.7 (prior peak of 1.85); baseline Cr of 0.9  - Will continue to trent - Continue to monitor urine outpt - Continue following recommendations from cardiology   T2DM Home regimen includes Trulicity, Lantus and Humalog. Has been on Semglee, Novolog, SSI in hospital. Most recently, pt's Semglee increased to 25 units daily and Novolog to 10 units TID, to begin 7/26. Glucose continues to be elevated; suspect steroid component. Pt also has been receiving IV Lasix in D5 solution, potentially contributing to elevated glucose levels; will leave for now given recent change to pt's insulin regimen. - Semglee 25 units daily - Novolog 10 units TID; SSI   Hyperkalemia Pt with K up to 5.4, given Lokelma with improvement. Most recent K on 7/26 was 4.1. Can d/c q4h K checks and instead monitor once daily with continued metabolic panels. - Prn Lokelma if K > 5.0  Bradycardia with Junctional Rhythm Hx of AV nodal dysfunction. Experienced episode of nonsustained Vtach 7/25, asymptomatic. Avoiding AV blocking agents (ie Beta Blockers). Cardiology consulted, will follow their recs. Will continue monitoring electrolytes.  - AVOID AV blocking agents    Leukocytosis, improving During hospitalization, WBC uptrended to 13.5, now downtrending with WBC on 7/25 12.6. Suspect elevation due to steroids, will monitor. - Trend CBCs   Acute Otitis Media - Augmentin 875-125 mg BID; day 4/5  Chronic Conditions: Normocytic anemia: Stable from prior. Hypertension: Continue amlodipine 10 mg daily. Holding  Entresto, spironolactone. Hyperlipidemia, CAD: Continue atorvastatin 80 mg daily, Zetia 10 mg daily, ASA 81 mg daily  Code Status: Full Diet: Renal/carb modified with fluid restriction (1200 mL) IV Fluid: None DVT Prophylaxis: Lovenox Dispo: Anticipate discharge to home in approximately 1-2 days   LOS: 3 days   Governor Rooks, Medical Student  06/18/2023, 8:26 AM

## 2023-06-18 NOTE — TOC Initial Note (Signed)
Transition of Care [facility]) - Initial/Assessment Note    Patient Details  Name: Alicia [name] MRN: [id number] Date of Birth: May 15, 1956  Transition of Care [facility]) CM/SW Contact:    Leone Haven, RN Phone Number: 06/18/2023, 4:20 PM  Clinical Narrative:                 From home with her daughter and two grand kids, she has PCP, Dr. Oswaldo Done, she has insurance on file.  She currently does not have any HH services in place.  She is refusing SNF per CSW.  NCM offered choice for HH, she does not have a preference.  NCM made referral to [facility] with [facility] for [facility], HHPT, HHOT.  He is able to take referral.  Soc will begin 24 to 48 hrs post dc.  She has home oxygen with [employer], she states when she wears her portable oxygen she uses 4 liters and when she uses her tank at home she uses 5 liters.  Her daughter will transport her home at dc and her daughter, is her support system.   [facility] [address]  Pta she is self ambulatory. She wants a rollator.  NCM contacted [employer] they state they no longer supply rollators, NCM made referral to [employer] which patient is ok with.  They will supply the rollator in am.    Expected Discharge Plan: Home w Home Health Services Barriers to Discharge: Continued Medical Work up   Patient Goals and CMS Choice Patient states their goals for this hospitalization and ongoing recovery are:: home with [facility] CMS Medicare.gov Compare Post Acute Care list provided to:: Patient Choice offered to / list presented to : Patient      Expected Discharge Plan and Services In-house Referral: NA Discharge Planning Services: CM Consult Post Acute Care Choice: Home Health, Durable Medical Equipment Living arrangements for the past 2 months: Single Family Home                 DME Arranged: Walker rolling with seat DME Agency: [employer] Date DME Agency Contacted: 06/18/23 Time DME Agency Contacted: [phone] Representative spoke with at  DME Agency: Vaughan Basta HH Arranged: RN, PT, OT HH Agency: [facility] Date [facility] Agency Contacted: 06/18/23 Time HH Agency Contacted: 1619 Representative spoke with at [facility] Agency: Kandee Keen  Prior Living Arrangements/Services Living arrangements for the past 2 months: Single Family Home Lives with:: Adult Children Patient language and need for interpreter reviewed:: Yes Do you feel safe going back to the place where you live?: Yes      Need for Family Participation in Patient Care: Yes (Comment) Care giver support system in place?: Yes (comment) Current home services: DME (has home oxgen with [employer] portable 4 liters, tanks 5 liters) Criminal Activity/Legal Involvement Pertinent to Current Situation/Hospitalization: No - Comment as needed  Activities of Daily Living Home Assistive Devices/Equipment: CBG Meter, Cane (specify quad or straight), Oxygen, Walker (specify type) ADL Screening (condition at time of admission) Patient's cognitive ability adequate to safely complete daily activities?: Yes Is the patient deaf or have difficulty hearing?: Yes Does the patient have difficulty seeing, even when wearing glasses/contacts?: Yes Does the patient have difficulty concentrating, remembering, or making decisions?: No Patient able to express need for assistance with ADLs?: Yes Does the patient have difficulty dressing or bathing?: No Independently performs ADLs?: Yes (appropriate for developmental age) Does the patient have difficulty walking or climbing stairs?: Yes Weakness of Legs:  Both Weakness of Arms/Hands: Left  Permission Sought/Granted Permission sought to share information with : Case Manager Permission granted to share information with : Yes, Verbal Permission Granted     Permission granted to share info w AGENCY: [facility]        Emotional Assessment Appearance:: Appears stated age Attitude/Demeanor/Rapport: Engaged Affect (typically observed): Appropriate Orientation:  : Oriented to Self, Oriented to Place, Oriented to  Time, Oriented to Situation Alcohol / Substance Use: Not Applicable Psych Involvement: No (comment)  Admission diagnosis:  Acute respiratory failure with hypoxia (HCC) [J96.01] Acute decompensated heart failure (HCC) [I50.9] Patient Active Problem List   Diagnosis Date Noted   Acute on chronic hypoxic respiratory failure (HCC) 06/16/2023   Acute decompensated heart failure (HCC) 06/15/2023   Candidal vulvovaginitis 05/26/2023   Abdominal wall skin ulcer (HCC) 04/14/2023   Hearing loss of right ear due to cerumen impaction 03/31/2023   AKI (acute kidney injury) (HCC) 02/16/2023   Encounter for screening for lung cancer 05/13/2020   Nocturnal hypoxemia 12/30/2018   Sinoatrial node dysfunction (HCC) 10/17/2018   Chronic respiratory failure with hypoxia, on home oxygen therapy (HCC) 10/14/2018   Aortic atherosclerosis (HCC) 09/24/2017   Gastroesophageal reflux disease 09/24/2017   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 09/24/2017   Seasonal allergic rhinitis due to pollen 09/24/2017   Healthcare maintenance 09/24/2017   Obesity (BMI 30.0-34.9) 09/24/2017   Hyperlipidemia    Uterine prolapse 02/12/2016   Coronary artery disease involving native coronary artery of native heart without angina pectoris 11/09/2014   COPD (chronic obstructive pulmonary disease) (HCC) 02/18/2014   Type 2 diabetes mellitus with moderate nonproliferative diabetic retinopathy (HCC) 10/15/2013   Essential hypertension 10/15/2013   PCP:  [name], MD Pharmacy[facility]   [phone] [place] Otto, [place] - [address] [place] [address] Phone: [phone] Fax: [phone]  [facility] [address] [place] [address] Phone: [phone] Fax: [phone]     Social Determinants of Health (SDOH) Social History: SDOH Screenings   Food  Insecurity: No Food Insecurity (06/18/2023)  Housing: Low Risk  (06/18/2023)  Transportation Needs: No Transportation Needs (06/18/2023)  Utilities: Not At Risk (06/18/2023)  Alcohol Screen: Low Risk  (06/18/2023)  Depression (PHQ2-9): Low Risk  (05/25/2023)  Financial Resource Strain: Low Risk  (06/18/2023)  Physical Activity: Inactive (05/19/2023)  Social Connections: Socially Isolated (05/19/2023)  Stress: No Stress Concern Present (05/19/2023)  Tobacco Use: Medium Risk (06/15/2023)   SDOH Interventions: Food Insecurity Interventions: Intervention Not Indicated Housing Interventions: Intervention Not Indicated Transportation Interventions: Intervention Not Indicated Alcohol Usage Interventions: Intervention Not Indicated (Score <7) Financial Strain Interventions: Intervention Not Indicated   Readmission Risk Interventions     No data to display

## 2023-06-18 NOTE — Consult Note (Signed)
   [facility] Inpatient Consult   06/18/2023  Ismari Fenrich Simmer 1955-12-26 [id number]  [name]  Accountable Care Organization [ACO] Patient: [facility] [Dual]  Primary Care Provider: [name], MD with [facility] which is listed to provide the transition of care follow up   Patient is currently active with [name] Care Management for chronic disease management services.  Patient has been engaged by a [facility].  Our community based plan of care has focused on disease management and community resource support.   Met with the patient at the bedside and introduced self.  Patient endorses PCP and Community RN CC for ongoing follow up. Patient lives with her daughter and 2 children   Plan: Notified Inpatient Transition Of Care [TOC] team member to make aware that [facility] Care Management following and that the patient expressed need for Rollator with a seat. She thinks oxygen is from [employer] and would like her tubing changed.  Notified and to  update the Community RN CC of any additional needs for post hospital follow up.  Of note, [facility] Care Management services does not replace or interfere with any services that are needed or arranged by inpatient [facility] care management team.   For additional questions or referrals please contact:  Charlesetta Shanks, RN BSN CCM [facility]  [phone] business mobile phone Toll free office [phone]  *Concierge Line  [phone] Fax number: [phone] [email or web address]

## 2023-06-18 NOTE — Progress Notes (Signed)
Nephrology Follow-Up Consult note   Assessment/Recommendations: Alicia [name] is a/an 67 y.o. female with a past medical history significant for COPD, HFpEF, DM2, CAD, HTN who present w/ acute on chronic hypoxic respiratory failure   Acute on chronic hypoxic respiratory failure: COPD at baseline.  Sounds like she may have had a virus that caused exacerbation of her COPD but respiratory panel negative -Supplemental oxygen and possible COPD exacerbation management per primary team -Diuresis as below   Acute CHF exacerbation: Diuresing well at this time -Continue IV Lasix to 120 mg 3 times daily and metolazone 5mg  BID -Reported side effect to canagliflozin; will discuss trial of farxiga with patient -Appreciate cardiology involvement   Non-oliguric AKI: likely cardiorenal. Improving. BUN rise attributable to steroids -diuretics as above -Continue to monitor daily Cr, Dose meds for GFR -Monitor Daily I/Os, Daily weight  -Maintain MAP>65 for optimal renal perfusion.  -Avoid nephrotoxic medications including NSAIDs -Use synthetic opioids (Fentanyl/Dilaudid) if needed -Currently no indication for HD   Hyperkalemia: Improving with diuresis   Type 2 diabetes uncontrolled with hyperglycemia: Management per primary team   Hypertension: Diuresis as above.  Holding some home blood pressure medications including Entresto and spironolactone.  On amlodipine   Recommendations conveyed to primary service.    Darnell Level [facility] 06/18/2023 9:27 AM  ___________________________________________________________  CC:  shortness of breath  Interval History/Subjective: Patient states she continues to feel better.  Breathing has improved.  Feels like she is having a hard time urinating.  Still has a Foley catheter in so we discussed this is likely bladder spasms.  Creatinine improving.  Urine output robust   Medications:  Current Facility-Administered Medications   Medication Dose Route Frequency Provider Last Rate Last Admin   amLODipine (NORVASC) tablet 10 mg  10 mg Oral Daily Champ Mungo, DO   10 mg at 06/18/23 0840   amoxicillin-clavulanate (AUGMENTIN) 875-125 MG per tablet 1 tablet  1 tablet Oral Q12H Bender, Irving Burton, DO   1 tablet at 06/18/23 0840   arformoterol (BROVANA) nebulizer solution 15 mcg  15 mcg Nebulization BID Reymundo Poll, MD   15 mcg at 06/18/23 0820   aspirin chewable tablet 81 mg  81 mg Oral Daily Champ Mungo, DO   81 mg at 06/18/23 0840   atorvastatin (LIPITOR) tablet 80 mg  80 mg Oral Daily Alexander-Savino, [place], MD   80 mg at 06/18/23 0840   budesonide (PULMICORT) nebulizer solution 0.25 mg  0.25 mg Nebulization BID Reymundo Poll, MD   0.25 mg at 06/18/23 0820   Chlorhexidine Gluconate Cloth 2 % PADS 6 each  6 each Topical Daily Jake Bathe, MD   6 each at 06/17/23 1430   enoxaparin (LOVENOX) injection 30 mg  30 mg Subcutaneous Q24H Reymundo Poll, MD   30 mg at 06/18/23 0841   ezetimibe (ZETIA) tablet 10 mg  10 mg Oral Daily Alexander-Savino, [place], MD   10 mg at 06/18/23 0840   furosemide (LASIX) 120 mg in dextrose 5 % 50 mL IVPB  120 mg Intravenous TID Darnell Level, MD 62 mL/hr at 06/18/23 0624 120 mg at 06/18/23 0624   guaiFENesin (ROBITUSSIN) 100 MG/5ML liquid 5 mL  5 mL Oral Q4H PRN Reymundo Poll, MD   5 mL at 06/17/23 1216   hydrOXYzine (VISTARIL) injection 25 mg  25 mg Intramuscular Q6H PRN Champ Mungo, DO   25 mg at 06/16/23 0258   insulin aspart (novoLOG) injection 0-20 Units  0-20 Units Subcutaneous TID WC  Faith Rogue, DO   15 Units at 06/18/23 1610   insulin aspart (novoLOG) injection 0-5 Units  0-5 Units Subcutaneous QHS Morene Crocker, MD   4 Units at 06/17/23 2209   insulin aspart (novoLOG) injection 10 Units  10 Units Subcutaneous TID WC Gwenevere Abbot, MD   10 Units at 06/18/23 0841   insulin glargine-yfgn [facility]) injection 25 Units  25 Units Subcutaneous Daily Faith Rogue, DO    25 Units at 06/18/23 0841   ipratropium-albuterol (DUONEB) 0.5-2.5 (3) MG/3ML nebulizer solution 3 mL  3 mL Nebulization BID Reymundo Poll, MD   3 mL at 06/18/23 0820   menthol-cetylpyridinium (CEPACOL) lozenge 3 mg  1 lozenge Oral PRN Champ Mungo, DO       metolazone (ZAROXOLYN) tablet 5 mg  5 mg Oral BID Darnell Level, MD   5 mg at 06/18/23 0840   predniSONE (DELTASONE) tablet 40 mg  40 mg Oral Q breakfast Champ Mungo, DO   40 mg at 06/18/23 0840   senna (SENOKOT) tablet 17.2 mg  2 tablet Oral Daily Champ Mungo, DO   17.2 mg at 06/18/23 0840   trimethoprim-polymyxin b (POLYTRIM) ophthalmic solution 1 drop  1 drop Both Eyes Q4H Champ Mungo, DO   1 drop at 06/18/23 0845      Review of Systems: 10 systems reviewed and negative except per interval history/subjective  Physical Exam: Vitals:   06/18/23 0718 06/18/23 0823  BP: (!) 159/57   Pulse: (!) 57   Resp: 17   Temp: 98 F (36.7 C)   SpO2: 94% 91%   No intake/output data recorded.  Intake/Output Summary (Last 24 hours) at 06/18/2023 9604 Last data filed at 06/18/2023 5409 Gross per 24 hour  Intake 1385.7 ml  Output 3750 ml  Net -2364.3 ml   Constitutional: sitting in bed, no acute distress ENMT: ears and nose without scars or lesions, MMM CV: normal rate, no edema Respiratory: no iwob, bilateral chest rise Gastrointestinal: soft, non-tender, no palpable masses or hernias Skin: no visible lesions or rashes Psych: alert, judgement/insight appropriate, appropriate mood and affect   Test Results I personally reviewed new and old clinical labs and radiology tests Lab Results  Component Value Date   NA 133 (L) 06/17/2023   K 4.1 06/18/2023   CL 89 (L) 06/17/2023   CO2 32 06/17/2023   BUN 92 (H) 06/17/2023   CREATININE 1.70 (H) 06/17/2023   GFR 110.83 03/27/2015   CALCIUM 9.1 06/17/2023   ALBUMIN 2.9 (L) 06/17/2023   PHOS 3.5 06/17/2023    CBC Recent Labs  Lab 06/15/23 1659 06/15/23 1711 06/16/23 0400  06/17/23 0500 06/17/23 2320  WBC 9.8  --  9.0 13.5* 12.6*  NEUTROABS 8.8*  --   --   --   --   HGB 9.2*   < > 9.3* 8.7* 9.5*  HCT 30.5*   < > 30.4* 28.1* 30.8*  MCV 85.9  --  85.2 86.5 84.8  PLT 255  --  281 301 315   < > = values in this interval not displayed.

## 2023-06-18 NOTE — Progress Notes (Signed)
Heart Failure Nurse Navigator Progress Note  PCP: [name], MD PCP-Cardiologist: Anne Fu Admission Diagnosis: Acute respiratory failure with hypoxia.  Admitted from: Home via EMS  Presentation:   Alicia [name] presented with weakness, vomiting, diarrhea x 1 week, on 4 L oxygen was in the 50's, placed on NRB by EMS.  Elevated CBG's 374, IV lasix given, patient with multiple admission to the ED. Has been seen in HF TOC on 10/2022 and 1/24.   Patient was reeducated on the sign and symptoms of heart failure, daily weights, diet/ fluid restrictions, taking all medications as prescribed and attending all medical appointments. Patient verbalized her understanding. A HF TOC follow up visit was scheduled for 07/14/2023 @ 1:30 pm.  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  Non-functioning   Aortic atherosclerosis (HCC) 09/24/2017   Asymptomatic, found on CT scan   Blood transfusion without reported diagnosis    Chronic Systolic Heart Failure 05/10/2014   Felt to be non-ischemic and secondary to hypertension.  Echo (05/28/2014): LVEF 25%.  Is not interested in AICD placement.   COPD exacerbation (HCC)    Coronary artery disease involving native coronary artery of native heart without angina pectoris 11/09/2014   Cardiac cath (02/18/2014): Non-obstructive, mRCA 30%, dRCA 60%   Cystocele with uterine prolapse - grade 3 02/12/2016   Not interested in pessary after trying   Diverticulosis of colon 09/24/2017   Diverticulosis of colon 09/24/2017   Essential hypertension 10/15/2013   Gastroesophageal reflux disease 09/24/2017   History of cerebrovascular accident 05/10/2013   Per patient report 6 previous strokes, most recent on 05/10/13.  No residual deficits.   History of  cerebrovascular accident 05/10/2013   Per patient report 6 previous strokes, most recent on 05/10/13.  No residual deficits.   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 (HCC) 10/15/2013     Social History   Socioeconomic History   Marital status: Widowed    Spouse name: Not on file   Number of children: 3   Years of education: Not on file   Highest education level: High school graduate  Occupational History   Occupation: Disabled    Comment: Formerly worked in Dentist  Tobacco Use   Smoking status: Former    Current packs/day: 0.00    Average packs/day: 1 pack/day for 40.0 years (40.0 ttl pk-yrs)    Types: Cigarettes    Start date: 09/17/1979    Quit date: 09/17/2019    Years since quitting: 3.7   Smokeless tobacco: Never  Vaping Use   Vaping status: Never Used  Substance and Sexual Activity   Alcohol use: No    Alcohol/week: 0.0 standard drinks of alcohol   Drug use: No   Sexual activity: Not Currently    Birth control/protection: Post-menopausal  Other Topics Concern   Not on file  Social History Narrative   Current Social History 02/25/2021  Patient lives with family in a home which is 2 stories. There are steps up to the entrance the patient uses with handrails.       Patient's method of transportation is personal car.      The highest level of education was some high school.      The patient currently disabled.      Identified important Relationships are "Family, my daughter and grandkids"       Pets : My dog       Interests / Fun: "sleeping, working in my flowerbed        Current  Stressors: The war in [place]       Social Determinants of Health   Financial Resource Strain: Low Risk  (05/19/2023)   Overall Financial Resource Strain (CARDIA)    Difficulty of Paying Living Expenses: Not hard at all  Food Insecurity: No Food Insecurity (05/19/2023)   Hunger Vital Sign    Worried About Running Out of Food in the Last Year: Never true    Ran Out of Food in the Last Year: Never true  Transportation Needs: No Transportation Needs (05/19/2023)   PRAPARE - Administrator, Civil Service (Medical): No    Lack of Transportation (Non-Medical): No  Physical Activity: Inactive (05/19/2023)   Exercise Vital Sign    Days of Exercise per Week: 0 days    Minutes of Exercise per Session: 0 min  Stress: No Stress Concern Present (05/19/2023)   [employer] of Occupational Health - Occupational Stress Questionnaire    Feeling of Stress : Not at all  Social Connections: Socially Isolated (05/19/2023)   Social Connection and Isolation Panel [NHANES]    Frequency of Communication with Friends and Family: More than three times a week    Frequency of Social Gatherings with Friends and Family: Never    Attends Religious Services: Never    Database administrator or Organizations: No    Attends Banker Meetings: Never    Marital Status: Widowed   Education Assessment and Provision:  Detailed education and instructions provided on heart failure disease management including the following:  Signs and symptoms of Heart Failure When to call the physician Importance of daily weights Low sodium diet Fluid restriction Medication management Anticipated future follow-up appointments  Patient education given on each of the above topics.  Patient acknowledges understanding via teach back method and acceptance of all instructions.  Education Materials:  "Living Better With Heart Failure" Booklet, HF zone tool, & Daily Weight Tracker Tool.  Patient has scale at  home: Yes Patient has pill box at home: Yes    High Risk Criteria for Readmission and/or Poor Patient Outcomes: Heart failure hospital admissions (last 6 months): 0  No Show rate: 5% Difficult social situation: No Demonstrates medication adherence: Yes Primary Language: English Literacy level: Reading, writing, and comprehension  Barriers of Care:   Diet/ fluid restrictions ( salty foods) Daily weights  Considerations/Referrals:   Referral made to Heart Failure Pharmacist Stewardship: Yes Referral made to Heart Failure CSW/NCM TOC: No Referral made to Heart & Vascular TOC clinic: Yes, follow up 07/14/2023 @ 1:30 pm.   Items for Follow-up on DC/TOC: Diet/ fluid restrictions ( salty foods) Daily weights Continued HF education   Rhae Hammock, BSN, RN Heart Failure Print production planner Chat Only

## 2023-06-18 NOTE — Care Management Important Message (Signed)
Important Message  Patient Details  Name: Alicia [name] MRN: [id number] Date of Birth: 04-12-1956   Medicare Important Message Given:  Yes     Renie Ora 06/18/2023, 9:04 AM

## 2023-06-18 NOTE — Progress Notes (Signed)
   Patient Name: Annebelle Fichter Tiedeman Date of Encounter: 06/18/2023 [facility] Cardiologist: Donato Schultz, MD   Interval Summary  .    Feeling better and better.  Jovial this morning.  Breathing is improved.  Vital Signs .    Vitals:   06/18/23 0420 06/18/23 0500 06/18/23 0718 06/18/23 0823  BP: (!) 159/63  (!) 159/57   Pulse: (!) 54  (!) 57   Resp: 16  17   Temp: 97.9 F (36.6 C)  98 F (36.7 C)   TempSrc: Oral  Oral   SpO2: 94%  94% 91%  Weight:  99.8 kg    Height:        Intake/Output Summary (Last 24 hours) at 06/18/2023 0844 Last data filed at 06/18/2023 6644 Gross per 24 hour  Intake 1385.7 ml  Output 3750 ml  Net -2364.3 ml      06/18/2023    5:00 AM 06/17/2023    1:31 PM 06/15/2023    4:31 PM  Last 3 Weights  Weight (lbs) 220 lb 0.3 oz 221 lb 9 oz 216 lb  Weight (kg) 99.8 kg 100.5 kg 97.977 kg      Telemetry/ECG    Right bundle branch block junctional rhythm 65, brief 8 beat episode of what appears to be atrial tachycardia- Personally Reviewed  Physical Exam .   GEN: No acute distress.   Neck: No JVD Cardiac: RRR, no murmurs, rubs, or gallops.  Respiratory: No crackles bilaterally. GI: Soft, nontender, non-distended  MS: No edema  Assessment & Plan .     Acute on chronic diastolic failure-EF 65%, grade 3 diastolic restrictive dysfunction Acute hypoxic respiratory failure COPD on home O2 Acute kidney injury - Still on high flow nasal cannula oxygen 8 L satting 91 to 94%.  Improving nonetheless.  Diuresed yesterday 3.7 L, day before 2.1 L.  Creatinine is holding stable at 1.7, BUN is 92 - Since there continues to be improvement, lets continue with high-dose Lasix with metolazone 5 mg.  -Continuing to hold spironolactone and Entresto -Potassium down to 4.8  Diabetes type 2 with morbid obesity - Random glucose this morning 412.  Per primary team  Continue amlodipine 10 mg Aspirin 81 mg Atorvastatin 80 mg Zetia 10 mg   For questions or  updates, please contact [facility] Please consult [email or web address] for contact info under        Signed, Donato Schultz, MD

## 2023-06-19 ENCOUNTER — Inpatient Hospital Stay (HOSPITAL_COMMUNITY): Payer: 59

## 2023-06-19 DIAGNOSIS — I5033 Acute on chronic diastolic (congestive) heart failure: Secondary | ICD-10-CM | POA: Diagnosis not present

## 2023-06-19 DIAGNOSIS — J9621 Acute and chronic respiratory failure with hypoxia: Secondary | ICD-10-CM | POA: Diagnosis not present

## 2023-06-19 DIAGNOSIS — Z87891 Personal history of nicotine dependence: Secondary | ICD-10-CM | POA: Diagnosis not present

## 2023-06-19 DIAGNOSIS — J441 Chronic obstructive pulmonary disease with (acute) exacerbation: Secondary | ICD-10-CM | POA: Diagnosis not present

## 2023-06-19 LAB — GLUCOSE, CAPILLARY
Glucose-Capillary: 300 mg/dL — ABNORMAL HIGH (ref 70–99)
Glucose-Capillary: 370 mg/dL — ABNORMAL HIGH (ref 70–99)
Glucose-Capillary: 330 mg/dL — ABNORMAL HIGH (ref 70–99)
Glucose-Capillary: 188 mg/dL — ABNORMAL HIGH (ref 70–99)
Glucose-Capillary: 199 mg/dL — ABNORMAL HIGH (ref 70–99)

## 2023-06-19 LAB — BLOOD GAS, ARTERIAL
Acid-Base Excess: 17.7 mmol/L — ABNORMAL HIGH (ref 0.0–2.0)
Bicarbonate: 44.2 mmol/L — ABNORMAL HIGH (ref 20.0–28.0)
O2 Saturation: 94.4 %
Patient temperature: 37
pCO2 arterial: 58 mmHg — ABNORMAL HIGH (ref 32–48)
pH, Arterial: 7.49 — ABNORMAL HIGH (ref 7.35–7.45)
pO2, Arterial: 68 mmHg — ABNORMAL LOW (ref 83–108)

## 2023-06-19 MED ORDER — INSULIN GLARGINE-YFGN 100 UNIT/ML ~~LOC~~ SOLN
35.0000 [IU] | Freq: Every day | SUBCUTANEOUS | Status: DC
  Administered 2023-06-19: 35 [IU] via SUBCUTANEOUS
  Filled 2023-06-19 (×2): qty 0.35

## 2023-06-19 MED ORDER — METOPROLOL TARTRATE 5 MG/5ML IV SOLN
2.5000 mg | Freq: Four times a day (QID) | INTRAVENOUS | Status: DC | PRN
  Administered 2023-06-19: 2.5 mg via INTRAVENOUS
  Filled 2023-06-19: qty 5

## 2023-06-19 MED ORDER — METOPROLOL TARTRATE 25 MG PO TABS
25.0000 mg | ORAL_TABLET | Freq: Two times a day (BID) | ORAL | Status: DC
  Administered 2023-06-20 – 2023-06-22 (×6): 25 mg via ORAL
  Filled 2023-06-19 (×6): qty 1

## 2023-06-19 MED ORDER — INSULIN ASPART 100 UNIT/ML IJ SOLN
15.0000 [IU] | Freq: Three times a day (TID) | INTRAMUSCULAR | Status: DC
  Administered 2023-06-19 (×3): 15 [IU] via SUBCUTANEOUS

## 2023-06-19 MED ORDER — APIXABAN 5 MG PO TABS
5.0000 mg | ORAL_TABLET | Freq: Two times a day (BID) | ORAL | Status: DC
  Administered 2023-06-20 – 2023-06-28 (×17): 5 mg via ORAL
  Filled 2023-06-19 (×17): qty 1

## 2023-06-19 MED ORDER — LEVALBUTEROL HCL 0.63 MG/3ML IN NEBU: 0.6300 mg | INHALATION_SOLUTION | Freq: Four times a day (QID) | RESPIRATORY_TRACT | Status: DC

## 2023-06-19 NOTE — Progress Notes (Signed)
   Patient Name: Alicia [name] Date of Encounter: 06/19/2023 [facility] Cardiologist: Donato Schultz, MD  Interval Summary  .    Very kind.  Feeling better.,  No chest pain.  Having bowel movement currently  Vital Signs .    Vitals:   06/19/23 0100 06/19/23 0414 06/19/23 0541 06/19/23 0832  BP:  (!) 172/70 (!) 148/58 (!) 146/52  Pulse: (!) 58 60 (!) 57   Resp: 18 20    Temp:  98.2 F (36.8 C)    TempSrc:  Oral    SpO2: 95% 93% 94%   Weight:  98.6 kg    Height:        Intake/Output Summary (Last 24 hours) at 06/19/2023 1019 Last data filed at 06/19/2023 0954 Gross per 24 hour  Intake 480 ml  Output 4650 ml  Net -4170 ml      06/19/2023    4:14 AM 06/18/2023    5:00 AM 06/17/2023    1:31 PM  Last 3 Weights  Weight (lbs) 217 lb 6 oz 220 lb 0.3 oz 221 lb 9 oz  Weight (kg) 98.6 kg 99.8 kg 100.5 kg      Telemetry/ECG    Junctional rhythm 60 to 70 right bundle branch block, prior brief atrial tachycardia run-- Personally Reviewed  Physical Exam .   GEN: No acute distress.   Neck: No JVD Cardiac: RRR, no murmurs, rubs, or gallops.  Respiratory: Clear to auscultation bilaterally. GI: Soft, nontender, non-distended  MS: No edema  Assessment & Plan .     Acute on chronic diastolic heart failure with grade 3 diastolic restrictive dysfunction, EF 65% COPD on home O2 Acute kidney injury - Continues to improve with aggressive diuresis high-dose Lasix with metolazone 5 mg.  Appreciate nephrology's assistance.  We will continue with this today and then possibly transition over to oral loop diuretic tomorrow. - Currently holding spironolactone and Entresto -Last potassium 4.5, creatinine 1.5  Diabetes type 2 with morbid obesity - Therapies per primary team.  Continuing with amlodipine 10 mg aspirin 81 mg atorvastatin 80 mg Zetia 10 mg    For questions or updates, please contact  [facility] Please consult [email or web address] for contact info under         Signed, Donato Schultz, MD

## 2023-06-19 NOTE — Progress Notes (Signed)
Subjective:  Alicia [name] is a 67 y.o. female with a PMHx significant for COPD, HFpEF, T2DM, CAD, HTN, GERD who was admitted for acute hypoxic respiratory failure likely multifactorial from acute on chronic HFpEF exacerbation and acute COPD exacerbation. She is being evaluated for an AKI that presented with anuria(resolved).  Today, she stated that she is doing well. Her breathing has improved, although she is still wheezing. She reported breathing relief with the duonebs. She reported fluttering/palpitations with lightheadedness/dizziness in her chest last night and believes it happened after finishing the duonebs. No abdominal pain reported.    Objective:  Vital signs in last 24 hours: Vitals:   06/19/23 0100 06/19/23 0414 06/19/23 0541 06/19/23 0832  BP:  (!) 172/70 (!) 148/58 (!) 146/52  Pulse: (!) 58 60 (!) 57   Resp: 18 20    Temp:  98.2 F (36.8 C)    TempSrc:  Oral    SpO2: 95% 93% 94%   Weight:  98.6 kg    Height:       Physical: General: Sitting in bed, NAD  Cardiac:RRR, no murmurs auscultated  Pulmonary:Expiratory wheezes auscultated b/l, on 8L HFNC Genitourinary:foley catheter in place  Skin:warm and dry Msk:NO pitting edema Neuro:alert and oriented Psych: normal mood and affect     Latest Ref Rng & Units 06/19/2023   12:34 AM 06/17/2023   11:20 PM 06/17/2023    5:00 AM  CBC  WBC 4.0 - 10.5 K/uL 11.3  12.6  13.5   Hemoglobin 12.0 - 15.0 g/dL 09.3  9.5  8.7   Hematocrit 36.0 - 46.0 % 32.0  30.8  28.1   Platelets 150 - 400 K/uL 356  315  301        Latest Ref Rng & Units 06/19/2023   12:34 AM 06/18/2023    1:39 PM 06/18/2023    7:55 AM  CMP  Glucose 70 - 99 mg/dL 235  573    BUN 8 - 23 mg/dL 88  82    Creatinine 2.20 - 1.00 mg/dL 2.54  2.70    Sodium 623 - 145 mmol/L 129  134    Potassium 3.5 - 5.1 mmol/L 4.5  4.3  4.1   Chloride 98 - 111 mmol/L 82  86    CO2 22 - 32 mmol/L 35  36    Calcium 8.9 - 10.3 mg/dL 9.0  9.4       Assessment/Plan:  Principal Problem:   Acute on chronic hypoxic respiratory failure (HCC) Active Problems:   COPD (chronic obstructive pulmonary disease) (HCC)   Chronic respiratory failure with hypoxia, on home oxygen therapy (HCC)   AKI (acute kidney injury) (HCC)   Acute decompensated heart failure (HCC)  Acute on Chronic Hypoxic Respiratory Failure COPD exacerbation Acute on Chronic HFpEF exacerbation Acute hypoxic respiratory failure is likely multifactorial:  HFpEF exacerbation and COPD exacerbation. Echocardiogram showed LVEF 55-60%, elevated left end diastolic pressure, and IVC dilation which is suggestive of fluid overload. Physical exam today was remarkable for expiratory wheezes auscultated bilaterally due to COPD exacerbation.  -Oxygen requirements improving, currently saturating well on 8L HFNC  -Wean as tolerated  -Keep O2 saturation between 89 and 93% -Continue COPD treatment with Brovana, pulmicort, robitussin, Augmentin, duonebs  -Day 5/5 prednisone  -Continue IV lasix per nephrology  -Continue cardiology recommendations  -Strict Is&Os  AKI Anuria:Resolved Urinalysis showed small amount of hemoglobin and 100mg /dl of protein. The foley catheter insertion is the likely cause of the hemoglobinuria. The past medical  history of T2DM/HTN is likely the cause of the proteinuria that appears to be chronic on past lab work. Low suspicion for glomerulonephritis due to other more likely causes of her hemoglobinuria and proteinuria. Possibly due to cardiorenal syndrome due to the improvement in creatinine after administering high dose lasix.  -Urine output 4.4 L yesterday, so far today -Nephrology following, continue to f/u with their recommendations   -120mg  IV Lasix TID, may transition to oral tomorrow per nephrology  -Serum Creatinine improved to 1.52: continue to trend -Hyperkalemia likely due to AKI, continue to monitor  -If K>5.5, obtain EKG  -Lowkelma if K+  continues to increase despite aggressive diuresis  -Strict Is&Os -Remove foley cathter after she urinates with oral diuretics  T2DM Despite increasing insulin during hospitalization, she had a fasting glucose of 486 and required 61 units of SSI in addition to basal insulin yesterday. Steroids for COPD exacerbation is the most likely cause of hyperglycemia; however, the lasix 120mg  IV is mixed with 50mL of 5% dextrose which could be contributing to the hyperglycemia (lesser extent than the steroids).  -Increased basal insulin to 35 units, increased meal time insulin to 15 units TID -Contacted nephrology due to IV lasix being mixed with dextrose, he would like to at least continue today's diuresis with the 120mg  TID, possible oral transition tomorrow  -Today is day 5/5 of steroid treatment  Palpitations H/o nonsustained asymptomatic Vtach -Will review telemetry -Most likely due to duonebs causing tachycardia/palpations  -Continue to monitor potassium and magnesium> 2.  Leukocytosis  Likely due to steroids, no sx or signs of systemic infection   Acute otitis media Augmentin treatment day 5/5   Prior to Admission Living Arrangement:home Anticipated Discharge Location:home Barriers to Discharge:medical treatment  Dispo: Anticipated discharge in approximately 2 day(s).   Faith Rogue, DO 06/19/2023, 11:13 AM Pager: [phone] After 5pm on weekdays and 1pm on weekends: On Call pager [phone]

## 2023-06-19 NOTE — Progress Notes (Signed)
RT called to room for pt desaturation to 60s on 8L [place]. RT brought HHFNC, placed on pt at 60L 70%. Pt heart rate increased and explained she couldn't breathe on HHFNC. Pt then placed pt on BIPAP, pt expressed she instantly felt better, O2 now between 92-95% with clear breath sounds, on 100% fio2.

## 2023-06-19 NOTE — Significant Event (Signed)
Rapid Response Event Note   Reason for Call :  Hypoxia, increased oxygen demand, tachycardia  Per RN, pt dropped SpO2 to 50s on 8L HFNC and her breathing became labored. HHFNC was attempted with no significant change in distress so pt was placed on bipap 100% with SpO2 increasing to 93% and pt reported feeling better. Pt HR increased to 130s during episode.  EKG done PTA RRT showing ST  Initial Focused Assessment:  Pt lying in bed with eyes open. She is alert and oriented, c/o SOB, denies CP. Her breathing is labored and tachypneic. Lungs diminished t/o. ABD large, soft. Skin cool to touch.  HR-130s, SBP-101/76, RR-28, SpO2-93% on 100% bipapfe   Interventions:  PTA RRT: duoneb, HHFNC>bipap, EKG-ST  IMTS orders: New EKG-Afib RVR PCXR-Similar congestive heart failure compared with 06/16/2023.  ABG-7.49/58/68/44.2 Duoneb changed to xopenex  Cardiology orders: eliquis per pharm, Lopressor 25mg  PO BID, 2.5mg  IV q6h prn Plan of Care:  SpO2-92% on .60 bipap. Pt breathing is less labored, visibly and per pt report. HR-120s with freq episodes where it drops to 60s for a short time and then goes back up. Continue to monitor pt closely. Please call RRT if further assistance needed.   Event Summary:   MD Notified: Drs. Sherrilee Gilles and Carlynn Purl notified and came to beside, Dr. Marisue Ivan MD) notified.  Call Time:2200(asked to see pt while rounding) Arrival Time:2200 End [id number]  Terrilyn Saver, RN

## 2023-06-19 NOTE — Evaluation (Signed)
Occupational Therapy Evaluation Patient Details Name: Alicia [name] MRN: [id number] DOB: 10/31/1956 Today's Date: 06/19/2023   History of Present Illness 67 y.o. female presents to [facility] hospital on 06/15/2023 with SOB, cough and ear pain. Pt required BiPAP in the ED due to hypoxia. Pt admitted for CHF and COPD exacerbation 2/2 URI. 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   Clinical Impression   Pt progressing towards goals, now on 8L O2. Pt able to complete 2 seated grooming tasks along with LB ADL with min guard-minA. Pt  min guard A for bed mobility and min guard for transfers with rollator. Educated pt on use of brakes and PLB/activity pacing. Pt able to ambulate in room on 8L O2, SpO2 dropping to 87% at lowest. Pt presenting with impairments listed below, will follow acutely. Updating d/c recommendation to HHOT.      Recommendations for follow up therapy are one component of a multi-disciplinary discharge planning process, led by the attending physician.  Recommendations may be updated based on patient status, additional functional criteria and insurance authorization.   Assistance Recommended at Discharge Frequent or constant Supervision/Assistance  Patient can return home with the following A little help with walking and/or transfers;A lot of help with bathing/dressing/bathroom;Assistance with cooking/housework;Assist for transportation;Help with stairs or ramp for entrance    Functional Status Assessment     Equipment Recommendations  Tub/shower bench;BSC/3in1    Recommendations for Other Services PT consult     Precautions / Restrictions Precautions Precautions: Fall Precaution Comments: monitor HR and sats Restrictions Weight Bearing Restrictions: No      Mobility Bed Mobility Overal bed mobility: Needs Assistance Bed Mobility: Supine to Sit, Sit to Supine     Supine to sit: Min  guard Sit to supine: Min guard        Transfers Overall transfer level: Needs assistance Equipment used: 1 person hand held assist Transfers: Sit to/from Stand, Bed to chair/wheelchair/BSC Sit to Stand: Min guard                  Balance Overall balance assessment: Needs assistance Sitting-balance support: Bilateral upper extremity supported Sitting balance-Leahy Scale: Normal Sitting balance - Comments: reaches outside BOS to pull up sock without LOB   Standing balance support: Bilateral upper extremity supported, No upper extremity supported Standing balance-Leahy Scale: Poor Standing balance comment: reliant on external support                           ADL either performed or assessed with clinical judgement   ADL Overall ADL's : Needs assistance/impaired Eating/Feeding: Set up;Sitting   Grooming: Oral care;Wash/dry face;Sitting;Set up   Upper Body Bathing: Moderate assistance;Sitting   Lower Body Bathing: Moderate assistance;Sitting/lateral leans   Upper Body Dressing : Minimal assistance;Sitting   Lower Body Dressing: Minimal assistance;Sitting/lateral leans   Toilet Transfer: Min guard;Ambulation;Rollator (4 wheels);Regular Toilet   Toileting- Clothing Manipulation and Hygiene: Min guard       Functional mobility during ADLs: Diplomatic Services operational officer (4 wheels)       Vision   Additional Comments: disconjugate gaze     Perception     Praxis      Pertinent Vitals/Pain Pain Assessment Pain Assessment: No/denies pain     Hand Dominance     Extremity/Trunk Assessment Upper Extremity Assessment Upper Extremity Assessment: Generalized weakness   Lower Extremity Assessment Lower Extremity Assessment:  Defer to PT evaluation       Communication     Cognition Arousal/Alertness: Awake/alert Behavior During Therapy: WFL for tasks assessed/performed Overall Cognitive Status: No family/caregiver present to determine baseline cognitive  functioning                                       General Comments  SpO2 87% at lowest on 8L O2    Exercises     Shoulder Instructions      Home Living                                          Prior Functioning/Environment                          OT Problem List:        OT Treatment/Interventions:      OT Goals(Current goals can be found in the care plan section) Acute Rehab OT Goals Patient Stated Goal: to get home OT Goal Formulation: With patient Time For Goal Achievement: 07/01/23 Potential to Achieve Goals: Good ADL Goals Pt Will Perform Upper Body Dressing: with supervision;sitting Pt Will Perform Lower Body Dressing: with supervision;with adaptive equipment;sitting/lateral leans;sit to/from stand Pt Will Transfer to Toilet: with supervision;ambulating;regular height toilet Additional ADL Goal #1: pt will be able to complete 2 standing functional tasks with SpO2 at or above 95% in order to improve activity tolerance for ADLs Additional ADL Goal #2: pt will verbalize x3 energy conservation strategies in prep for ADLs  OT Frequency: Min 1X/week    Co-evaluation              AM-PAC OT "6 Clicks" Daily Activity     Outcome Measure Help from another person eating meals?: None Help from another person taking care of personal grooming?: A Little Help from another person toileting, which includes using toliet, bedpan, or urinal?: A Little Help from another person bathing (including washing, rinsing, drying)?: A Lot Help from another person to put on and taking off regular upper body clothing?: A Little Help from another person to put on and taking off regular lower body clothing?: A Little 6 Click Score: 18   End of Session Equipment Utilized During Treatment: Oxygen (8L) Nurse Communication: Mobility status  Activity Tolerance: Patient tolerated treatment well Patient left: in bed;with call bell/phone within  reach;with bed alarm set  OT Visit Diagnosis: Unsteadiness on feet (R26.81);Other abnormalities of gait and mobility (R26.89);Muscle weakness (generalized) (M62.81)                Time: 1914-7829 OT Time Calculation (min): 40 min Charges:  OT General Charges $OT Visit: 1 Visit OT Treatments $Self Care/Home Management : 23-37 mins $Therapeutic Activity: 8-22 mins  Alicia [name], OTD, OTR/L SecureChat Preferred Acute Rehab [phone]   Alicia [name] Koonce 06/19/2023, 11:55 AM

## 2023-06-19 NOTE — Progress Notes (Addendum)
Paged by RN regarding elevated HR and increased O2 demand. Initially sinus tachycardia but then noted to be irregular. Has episode where HR decreases to 60-70 momentarily. Was on 8L HFNC but now requiring BiPAP up to 80% FiO2. Has SOB. Denies chest pain or wheezing. Remains alert. Denies any discomfort or pain. Has adequate urinary output with diuresis of Lasix and Metolazone. Just received PM IV lasix and recent Duoneb treatment.  Afebrile, HR 120-150, BP 107/76 (86) Cardiovascular: tachycardia, irregular rhythm Pulmonary: on BiPAP with FiO2 60%, no significant wheezing or crackles  Last EKG showed Afib with RVR.  Plan Hypoxic on HFNC requiring BiPAP and new onset Afib with RVR.  -STAT CXR -STAT ABG -change Duoneb to Xopenex q6h -Spoke with cardiology (Dr. Brayton Layman), will add on metoprolol for rate control  -Low threshold if worsening O2 demand to consult PCCM  [phone] Addendum: CXR appear stable if not slightly improved. ABG showed pH 7.49, pCO2 58 and bicarb 44 which suggest metabolic alkalosis. Now on BiPAP FiO2 60%. Remains alert. Just received IV metoprolol and HR 120s. Still low threshold if worsening to consult critical care.   0400 Addendum: BiPAP FiO2 50%. Remains alert and denies any pain or discomfort. Pending repeat blood gas. HR down to 110s.

## 2023-06-19 NOTE — Plan of Care (Signed)

## 2023-06-19 NOTE — Progress Notes (Signed)
Pt has BG of 509, repeated glucose in opposite hand reading was 486. Pt asymptomatic , provider on call aware. States to give scheduled humalog 5 units. Providers state adjustments will be made to long acting insulin in the a.m.

## 2023-06-19 NOTE — Progress Notes (Signed)
Nephrology Follow-Up Consult note   Assessment/Recommendations: Alicia [name] is a/an 67 y.o. female with a past medical history significant for COPD, HFpEF, DM2, CAD, HTN who present w/ acute on chronic hypoxic respiratory failure   Acute on chronic hypoxic respiratory failure: COPD at baseline.  Sounds like she may have had a virus that caused exacerbation of her COPD but respiratory panel negative -Supplemental oxygen and possible COPD exacerbation management per primary team -Diuresis as below   Acute CHF exacerbation: Diuresing well at this time -Continue IV Lasix to 120 mg 3 times daily and metolazone 5mg  BID -Reported side effect to canagliflozin; will discuss trial of farxiga with patient -will consider switching to oral diuretics tomorrow -Appreciate cardiology involvement   Non-oliguric AKI: likely cardiorenal. Improving. BUN rise attributable to steroids -diuretics as above -Continue to monitor daily Cr, Dose meds for GFR -Monitor Daily I/Os, Daily weight  -Maintain MAP>65 for optimal renal perfusion.  -Avoid nephrotoxic medications including NSAIDs -Use synthetic opioids (Fentanyl/Dilaudid) if needed -Currently no indication for HD   Hyperkalemia: Improving with diuresis   Type 2 diabetes uncontrolled with hyperglycemia: Management per primary team   Hypertension: Diuresis as above.  Holding some home blood pressure medications including Entresto and spironolactone.  On amlodipine   Recommendations conveyed to primary service.    Darnell Level [facility] 06/19/2023 8:15 AM  ___________________________________________________________  CC:  shortness of breath  Interval History/Subjective: Patient continues to feel better every day.  Urine output robust with 4.4 L made.  Creatinine improved.   Medications:  Current Facility-Administered Medications  Medication Dose Route Frequency Provider Last Rate Last Admin   amLODipine (NORVASC)  tablet 10 mg  10 mg Oral Daily Champ Mungo, DO   10 mg at 06/18/23 0840   amoxicillin-clavulanate (AUGMENTIN) 875-125 MG per tablet 1 tablet  1 tablet Oral Q12H Bender, Irving Burton, DO   1 tablet at 06/18/23 2145   arformoterol (BROVANA) nebulizer solution 15 mcg  15 mcg Nebulization BID Reymundo Poll, MD   15 mcg at 06/18/23 1949   aspirin chewable tablet 81 mg  81 mg Oral Daily Champ Mungo, DO   81 mg at 06/18/23 0840   atorvastatin (LIPITOR) tablet 80 mg  80 mg Oral Daily Alexander-Savino, [place], MD   80 mg at 06/18/23 0840   budesonide (PULMICORT) nebulizer solution 0.25 mg  0.25 mg Nebulization BID Reymundo Poll, MD   0.25 mg at 06/18/23 1959   Chlorhexidine Gluconate Cloth 2 % PADS 6 each  6 each Topical Daily Jake Bathe, MD   6 each at 06/18/23 0955   enoxaparin (LOVENOX) injection 30 mg  30 mg Subcutaneous Q24H Reymundo Poll, MD   30 mg at 06/18/23 0841   ezetimibe (ZETIA) tablet 10 mg  10 mg Oral Daily Alexander-Savino, [place], MD   10 mg at 06/18/23 0840   furosemide (LASIX) 120 mg in dextrose 5 % 50 mL IVPB  120 mg Intravenous TID Darnell Level, MD 62 mL/hr at 06/19/23 0611 120 mg at 06/19/23 0611   guaiFENesin (ROBITUSSIN) 100 MG/5ML liquid 5 mL  5 mL Oral Q4H PRN Reymundo Poll, MD   5 mL at 06/17/23 1216   hydrOXYzine (VISTARIL) injection 25 mg  25 mg Intramuscular Q6H PRN Champ Mungo, DO   25 mg at 06/16/23 0258   insulin aspart (novoLOG) injection 0-20 Units  0-20 Units Subcutaneous TID WC Faith Rogue, DO   7 Units at 06/18/23 1906   insulin aspart (novoLOG) injection 0-5 Units  0-5 Units Subcutaneous QHS Morene Crocker, MD   5 Units at 06/18/23 2145   insulin aspart (novoLOG) injection 15 Units  15 Units Subcutaneous TID WC Bender, Emily, DO       insulin glargine-yfgn (SEMGLEE) injection 35 Units  35 Units Subcutaneous Daily Bender, Emily, DO       ipratropium-albuterol (DUONEB) 0.5-2.5 (3) MG/3ML nebulizer solution 3 mL  3 mL Nebulization BID  Reymundo Poll, MD   3 mL at 06/18/23 1949   menthol-cetylpyridinium (CEPACOL) lozenge 3 mg  1 lozenge Oral PRN Champ Mungo, DO       metolazone (ZAROXOLYN) tablet 5 mg  5 mg Oral BID Darnell Level, MD   5 mg at 06/18/23 1901   predniSONE (DELTASONE) tablet 40 mg  40 mg Oral Q breakfast Champ Mungo, DO   40 mg at 06/18/23 0840   senna (SENOKOT) tablet 17.2 mg  2 tablet Oral Daily Champ Mungo, DO   17.2 mg at 06/18/23 0840   trimethoprim-polymyxin b (POLYTRIM) ophthalmic solution 1 drop  1 drop Both Eyes Q4H Champ Mungo, DO   1 drop at 06/19/23 0419      Review of Systems: 10 systems reviewed and negative except per interval history/subjective  Physical Exam: Vitals:   06/19/23 0414 06/19/23 0541  BP: (!) 172/70 (!) 148/58  Pulse: 60 (!) 57  Resp: 20   Temp: 98.2 F (36.8 C)   SpO2: 93% 94%   No intake/output data recorded.  Intake/Output Summary (Last 24 hours) at 06/19/2023 0815 Last data filed at 06/19/2023 0544 Gross per 24 hour  Intake 460 ml  Output 4350 ml  Net -3890 ml   Constitutional: sitting in bed, no acute distress ENMT: ears and nose without scars or lesions, MMM CV: normal rate, no edema Respiratory: no iwob, bilateral chest rise Gastrointestinal: soft, non-tender, no palpable masses or hernias Skin: no visible lesions or rashes Psych: alert, judgement/insight appropriate, appropriate mood and affect   Test Results I personally reviewed new and old clinical labs and radiology tests Lab Results  Component Value Date   NA 129 (L) 06/19/2023   K 4.5 06/19/2023   CL 82 (L) 06/19/2023   CO2 35 (H) 06/19/2023   BUN 88 (H) 06/19/2023   CREATININE 1.52 (H) 06/19/2023   GFR 110.83 03/27/2015   CALCIUM 9.0 06/19/2023   ALBUMIN 2.8 (L) 06/19/2023   PHOS 3.3 06/19/2023    CBC Recent Labs  Lab 06/15/23 1659 06/15/23 1711 06/17/23 0500 06/17/23 2320 06/19/23 0034  WBC 9.8   < > 13.5* 12.6* 11.3*  NEUTROABS 8.8*  --   --   --   --   HGB 9.2*   < >  8.7* 9.5* 10.2*  HCT 30.5*   < > 28.1* 30.8* 32.0*  MCV 85.9   < > 86.5 84.8 80.2  PLT 255   < > 301 315 356   < > = values in this interval not displayed.

## 2023-06-19 NOTE — Progress Notes (Signed)
ANTICOAGULATION CONSULT NOTE - Initial Consult  Pharmacy Consult for Apixaban Indication: atrial fibrillation  Allergies  Allergen Reactions   Beta Adrenergic Blockers Other (See Comments)    Sinus pauses   Canagliflozin Itching, Anxiety and Palpitations    Patient Measurements: Height: 5\' 7"  (170.2 cm) Weight: 98.6 kg (217 lb 6 oz) IBW/kg (Calculated) : 61.6  Vital Signs: Temp: 98.1 F (36.7 C) (07/27 2006) Temp Source: Oral (07/27 2006) BP: 101/76 (07/27 2216) Pulse Rate: 71 (07/27 2255)  Labs: Recent Labs    06/17/23 0500 06/17/23 1200 06/17/23 2320 06/18/23 1339 06/19/23 0034  HGB 8.7*  --  9.5*  --  10.2*  HCT 28.1*  --  30.8*  --  32.0*  PLT 301  --  315  --  356  CREATININE  --    < > 1.70* 1.47* 1.52*   < > = values in this interval not displayed.    Estimated Creatinine Clearance: 44.5 mL/min (A) (by C-G formula based on SCr of 1.52 mg/dL (H)).   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  Non-functioning   Aortic atherosclerosis (HCC) 09/24/2017   Asymptomatic, found on CT scan   Blood transfusion without reported diagnosis    Chronic Systolic Heart Failure 05/10/2014   Felt to be non-ischemic and secondary to hypertension.  Echo (05/28/2014): LVEF 25%.  Is not interested in AICD placement.   COPD exacerbation (HCC)    Coronary artery disease involving native coronary artery of native heart without angina pectoris 11/09/2014   Cardiac cath (02/18/2014): Non-obstructive, mRCA 30%, dRCA 60%   Cystocele with uterine prolapse - grade 3 02/12/2016   Not interested in pessary after trying   Diverticulosis of colon 09/24/2017   Diverticulosis of colon 09/24/2017   Essential hypertension 10/15/2013   Gastroesophageal reflux disease 09/24/2017   History of cerebrovascular  accident 05/10/2013   Per patient report 6 previous strokes, most recent on 05/10/13.  No residual deficits.   History of cerebrovascular accident 05/10/2013   Per patient report 6 previous strokes, most recent on 05/10/13.  No residual deficits.   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 (HCC) 10/15/2013     Assessment: 66 y/o F with new onset atrial fibrillation. Starting Apixaban. Hgb 10.2. Scr 1.52. PTA meds reviewed.   Goal of Therapy:  Monitor platelets by anticoagulation protocol: Yes   Plan:  Start Apixaban 5 mg BID Daily CBC Monitor for bleeding  Abran Duke, PharmD, BCPS Clinical Pharmacist Phone: [phone]

## 2023-06-20 DIAGNOSIS — J9621 Acute and chronic respiratory failure with hypoxia: Secondary | ICD-10-CM | POA: Diagnosis not present

## 2023-06-20 LAB — CBC
HCT: 35 % — ABNORMAL LOW (ref 36.0–46.0)
Hemoglobin: 11.4 g/dL — ABNORMAL LOW (ref 12.0–15.0)
MCH: 27 pg (ref 26.0–34.0)
MCHC: 32.6 g/dL (ref 30.0–36.0)
MCV: 82.7 fL (ref 80.0–100.0)
Platelets: 368 10*3/uL (ref 150–400)
RBC: 4.23 MIL/uL (ref 3.87–5.11)
RDW: 14.9 % (ref 11.5–15.5)
WBC: 13 10*3/uL — ABNORMAL HIGH (ref 4.0–10.5)
nRBC: 0.3 % — ABNORMAL HIGH (ref 0.0–0.2)

## 2023-06-20 LAB — BLOOD GAS, VENOUS
Acid-Base Excess: 17.9 mmol/L — ABNORMAL HIGH (ref 0.0–2.0)
Acid-Base Excess: 19.3 mmol/L — ABNORMAL HIGH (ref 0.0–2.0)
Bicarbonate: 45.5 mmol/L — ABNORMAL HIGH (ref 20.0–28.0)
Bicarbonate: 47.5 mmol/L — ABNORMAL HIGH (ref 20.0–28.0)
Drawn by: 42628
O2 Saturation: 51.3 %
O2 Saturation: 26.9 %
Patient temperature: 37
Patient temperature: 36.7
pCO2, Ven: 67 mmHg — ABNORMAL HIGH (ref 44–60)
pCO2, Ven: 69 mmHg — ABNORMAL HIGH (ref 44–60)
pH, Ven: 7.44 — ABNORMAL HIGH (ref 7.25–7.43)
pH, Ven: 7.44 — ABNORMAL HIGH (ref 7.25–7.43)
pO2, Ven: 34 mmHg (ref 32–45)
pO2, Ven: <31 mmHg — CL (ref 32–45)

## 2023-06-20 LAB — GLUCOSE, CAPILLARY
Glucose-Capillary: 242 mg/dL — ABNORMAL HIGH (ref 70–99)
Glucose-Capillary: 217 mg/dL — ABNORMAL HIGH (ref 70–99)
Glucose-Capillary: 302 mg/dL — ABNORMAL HIGH (ref 70–99)
Glucose-Capillary: 236 mg/dL — ABNORMAL HIGH (ref 70–99)

## 2023-06-20 LAB — RENAL FUNCTION PANEL
Albumin: 2.7 g/dL — ABNORMAL LOW (ref 3.5–5.0)
Anion gap: 14 (ref 5–15)
BUN: 101 mg/dL — ABNORMAL HIGH (ref 8–23)
CO2: 34 mmol/L — ABNORMAL HIGH (ref 22–32)
Calcium: 9 mg/dL (ref 8.9–10.3)
Chloride: 83 mmol/L — ABNORMAL LOW (ref 98–111)
Creatinine, Ser: 1.72 mg/dL — ABNORMAL HIGH (ref 0.44–1.00)
GFR, Estimated: 33 mL/min — ABNORMAL LOW (ref >60)
Glucose, Bld: 255 mg/dL — ABNORMAL HIGH (ref 70–99)
Phosphorus: 4.5 mg/dL (ref 2.5–4.6)
Potassium: 4.1 mmol/L (ref 3.5–5.1)
Sodium: 131 mmol/L — ABNORMAL LOW (ref 135–145)

## 2023-06-20 LAB — BRAIN NATRIURETIC PEPTIDE: B Natriuretic Peptide: 211.7 pg/mL — ABNORMAL HIGH (ref 0.0–100.0)

## 2023-06-20 MED ORDER — INSULIN ASPART 100 UNIT/ML IJ SOLN
18.0000 [IU] | Freq: Three times a day (TID) | INTRAMUSCULAR | Status: DC
  Administered 2023-06-20 – 2023-06-28 (×26): 18 [IU] via SUBCUTANEOUS

## 2023-06-20 MED ORDER — LEVALBUTEROL HCL 0.63 MG/3ML IN NEBU
0.6300 mg | INHALATION_SOLUTION | Freq: Two times a day (BID) | RESPIRATORY_TRACT | Status: DC
  Filled 2023-06-20: qty 3

## 2023-06-20 MED ORDER — INSULIN GLARGINE-YFGN 100 UNIT/ML ~~LOC~~ SOLN
40.0000 [IU] | Freq: Every day | SUBCUTANEOUS | Status: DC
  Administered 2023-06-20: 40 [IU] via SUBCUTANEOUS
  Filled 2023-06-20 (×2): qty 0.4

## 2023-06-20 MED ORDER — IPRATROPIUM BROMIDE 0.02 % IN SOLN
RESPIRATORY_TRACT | Status: AC
  Filled 2023-06-20: qty 2.5

## 2023-06-20 MED ORDER — SIMETHICONE 80 MG PO CHEW
80.0000 mg | CHEWABLE_TABLET | Freq: Once | ORAL | Status: AC
  Administered 2023-06-20: 80 mg via ORAL
  Filled 2023-06-20: qty 1

## 2023-06-20 MED ORDER — LEVALBUTEROL HCL 0.63 MG/3ML IN NEBU
0.6300 mg | INHALATION_SOLUTION | Freq: Four times a day (QID) | RESPIRATORY_TRACT | Status: DC | PRN
  Administered 2023-06-20 (×3): 0.63 mg via RESPIRATORY_TRACT
  Filled 2023-06-20 (×2): qty 3

## 2023-06-20 MED ORDER — IPRATROPIUM BROMIDE 0.02 % IN SOLN
0.5000 mg | Freq: Four times a day (QID) | RESPIRATORY_TRACT | Status: DC | PRN
  Administered 2023-06-20 (×2): 0.5 mg via RESPIRATORY_TRACT
  Filled 2023-06-20 (×2): qty 2.5

## 2023-06-20 MED ORDER — TORSEMIDE 20 MG PO TABS
40.0000 mg | ORAL_TABLET | Freq: Two times a day (BID) | ORAL | Status: DC
  Administered 2023-06-20: 40 mg via ORAL
  Filled 2023-06-20 (×2): qty 2

## 2023-06-20 NOTE — Progress Notes (Signed)
   06/20/23 0344  Assess: MEWS Score  Pulse Rate (!) 112  Resp 16  SpO2 91 %  FiO2 (%) 50 %  Assess: MEWS Score  MEWS Temp 0  MEWS Systolic 0  MEWS Pulse 2  MEWS RR 0  MEWS LOC 0  MEWS Score 2  MEWS Score Color Yellow  Assess: if the MEWS score is Yellow or Red  Were vital signs accurate and taken at a resting state? Yes  Does the patient meet 2 or more of the SIRS criteria? No  MEWS guidelines implemented  No, previously yellow, continue vital signs every 4 hours  Assess: SIRS CRITERIA  SIRS Temperature  0  SIRS Pulse 1  SIRS Respirations  0  SIRS WBC 1  SIRS Score Sum  2

## 2023-06-20 NOTE — Progress Notes (Signed)
Occupational Therapy Treatment Patient Details Name: Alicia [name] MRN: [id number] DOB: 03-Feb-1956 Today's Date: 06/20/2023   History of present illness 67 y.o. female presents to [facility] hospital on 06/15/2023 with SOB, cough and ear pain. Pt required BiPAP in the ED due to hypoxia. Pt admitted for CHF and COPD exacerbation 2/2 URI. 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   OT comments  Pt. Seen for skilled OT treatment session. RN requests bed level session today.  Dtr. Present and active in session also.  Energy conservation handouts provided and reviewed with both.  Pt. And dtr. Receptive and appreciative to the education/review.  Both able to provide examples pt. Already has in place at home.  PLB education/review also. Pt. With good return demo.  Progress activity next session as able, with focus on integration of these strategies during mobility and adls.     Recommendations for follow up therapy are one component of a multi-disciplinary discharge planning process, led by the attending physician.  Recommendations may be updated based on patient status, additional functional criteria and insurance authorization.    Assistance Recommended at Discharge Frequent or constant Supervision/Assistance  Patient can return home with the following  A little help with walking and/or transfers;A lot of help with bathing/dressing/bathroom;Assistance with cooking/housework;Assist for transportation;Help with stairs or ramp for entrance   Equipment Recommendations  Tub/shower bench;BSC/3in1    Recommendations for Other Services      Precautions / Restrictions Precautions Precautions: Fall Precaution Comments: monitor HR and sats       Mobility Bed Mobility               General bed mobility comments: RN reqeusts bed level therapy today    Transfers                         Balance                                            ADL either performed or assessed with clinical judgement   ADL Overall ADL's : Needs assistance/impaired                                       General ADL Comments: RN requests bed level therapy today-focus on session was energy conservation strategies/handouts along with PLB education and review.  Pt. And dtr. Able to provide examples already in place.  Ie: chairs for rest breaks, gathering items in advance prior to desired task.  Reviewed PLB at length and reviewed times when pt. Should not talk as much and focus on how her body feels so she can start to feel o2 fluctuations and be aware of times for rest and also to focus on the PLB.  Reviewed talking, eating, ambulation all times where o2 can be strained along with adls b/d particularly.  Reviewed importance of rest breaks.      Extremity/Trunk Assessment              Vision       Perception     Praxis      Cognition Arousal/Alertness: Awake/alert Behavior During Therapy: WFL for tasks assessed/performed Overall Cognitive Status: Within Functional Limits for  tasks assessed                                          Exercises      Shoulder Instructions       General Comments      Pertinent Vitals/ Pain       Pain Assessment Pain Assessment: No/denies pain  Home Living                                          Prior Functioning/Environment              Frequency  Min 1X/week        Progress Toward Goals  OT Goals(current goals can now be found in the care plan section)  Progress towards OT goals: Progressing toward goals     Plan Discharge plan needs to be updated;Frequency remains appropriate    Co-evaluation                 AM-PAC OT "6 Clicks" Daily Activity     Outcome Measure   Help from another person eating meals?: None Help from another person taking care of personal grooming?: A  Little Help from another person toileting, which includes using toliet, bedpan, or urinal?: A Little Help from another person bathing (including washing, rinsing, drying)?: A Lot Help from another person to put on and taking off regular upper body clothing?: A Little Help from another person to put on and taking off regular lower body clothing?: A Little 6 Click Score: 18    End of Session Equipment Utilized During Treatment: Oxygen  OT Visit Diagnosis: Unsteadiness on feet (R26.81);Other abnormalities of gait and mobility (R26.89);Muscle weakness (generalized) (M62.81)   Activity Tolerance Patient tolerated treatment well   Patient Left in bed;with call bell/phone within reach;with family/visitor present   Nurse Communication Other (comment) (reviewed with RN pts. dtr requests for review/update on last nights events along with education on understanding the monitor readings and ranges)        Time: 9147-8295 OT Time Calculation (min): 23 min  Charges: OT General Charges $OT Visit: 1 Visit OT Treatments $Self Care/Home Management : 23-37 mins  Boneta Lucks, COTA/L Acute Rehabilitation [phone]   Alessandra Bevels Lorraine-COTA/L 06/20/2023, 12:38 PM

## 2023-06-20 NOTE — Progress Notes (Signed)
   06/19/23 2355  Assess: MEWS Score  Pulse Rate 64  Resp (!) 25  SpO2 90 %  FiO2 (%) 60 %  Assess: MEWS Score  MEWS Temp 0  MEWS Systolic 0  MEWS Pulse 0  MEWS RR 1  MEWS LOC 0  MEWS Score 1  MEWS Score Color Green  Assess: if the MEWS score is Yellow or Red  Were vital signs accurate and taken at a resting state? Yes  Does the patient meet 2 or more of the SIRS criteria? No  Assess: SIRS CRITERIA  SIRS Temperature  0  SIRS Pulse 0  SIRS Respirations  1  SIRS WBC 0  SIRS Score Sum  1

## 2023-06-20 NOTE — Progress Notes (Signed)
   06/19/23 2216  Assess: MEWS Score  BP 101/76  Pulse Rate (!) 140  Resp (!) 26  SpO2 92 %  FiO2 (%) 100 %  Assess: MEWS Score  MEWS Temp 0  MEWS Systolic 0  MEWS Pulse 3  MEWS RR 2  MEWS LOC 0  MEWS Score 5  MEWS Score Color Red  Assess: if the MEWS score is Yellow or Red  Were vital signs accurate and taken at a resting state? Yes  Does the patient meet 2 or more of the SIRS criteria? No  MEWS guidelines implemented   (MD notified and RR RN notified and at bedside)  Notify: Rapid Response  Name of Rapid Response RN Notified Mindy RN  Date Rapid Response Notified 06/19/23  Assess: SIRS CRITERIA  SIRS Temperature  0  SIRS Pulse 1  SIRS Respirations  1  SIRS WBC 0  SIRS Score Sum  2

## 2023-06-20 NOTE — Progress Notes (Signed)
   06/20/23 0156  Assess: MEWS Score  Pulse Rate (!) 111  O2 Device Bi-PAP  FiO2 (%) 60 %  Assess: MEWS Score  MEWS Temp 0  MEWS Systolic 0  MEWS Pulse 2  MEWS RR 1  MEWS LOC 0  MEWS Score 3  MEWS Score Color Yellow  Assess: if the MEWS score is Yellow or Red  Were vital signs accurate and taken at a resting state? Yes  Does the patient meet 2 or more of the SIRS criteria? No  MEWS guidelines implemented  No, previously yellow, continue vital signs every 4 hours  Assess: SIRS CRITERIA  SIRS Temperature  0  SIRS Pulse 1  SIRS Respirations  1  SIRS WBC 0  SIRS Score Sum  2

## 2023-06-20 NOTE — Progress Notes (Signed)
Afternoon VBG came back with critical reading of pO2<31. Went up to see pt, and found her initially sleeping with O2 sats in the low-mid 90s. Pt awoke and reported no trouble breathing or shortness of breath since being switched to HFNC from BiPAP. She reported she was feeling well and had no concerns other than dry throat. She reports she ate a late breakfast around noon. She continued to sat in the mid-90s (94-96%) throughout the conversation.

## 2023-06-20 NOTE — Progress Notes (Signed)
   06/20/23 0600  Assess: MEWS Score  Pulse Rate 68  ECG Heart Rate 68  Resp 19  SpO2 93 %  Assess: MEWS Score  MEWS Temp 0  MEWS Systolic 0  MEWS Pulse 0  MEWS RR 0  MEWS LOC 0  MEWS Score 0  MEWS Score Color Green  Assess: if the MEWS score is Yellow or Red  Were vital signs accurate and taken at a resting state? Yes  Does the patient meet 2 or more of the SIRS criteria? No  MEWS guidelines implemented   (pt is in NSR now)  Assess: SIRS CRITERIA  SIRS Temperature  0  SIRS Pulse 0  SIRS Respirations  0  SIRS WBC 1  SIRS Score Sum  1

## 2023-06-20 NOTE — Progress Notes (Addendum)
Subjective:  Alicia [name] is a 67 y.o. female with a PMHx significant for COPD, HFpEF, T2DM, CAD, HTN, GERD who was admitted for acute hypoxic respiratory failure likely multifactorial from acute on chronic HFpEF exacerbation and acute COPD exacerbation.  ONE significant for pt desating to 50s-60s in the evening of 7/27 requiring increased O2 supplementation eventually leading to resuming BiPAP. CXR appeared stable from prior done 7/24. Pt then went into A fib after BiPAP placed with IV metoprolol given. Pt's HR eventually stabilized. Pt was out of A fib as of 0600 on 7/28. She received scheduled dose of metoprolol this AM and is in NSR  Pt was examined at bedside today. She is on BiPAP currently at FiO2 50%. She reports she feels much improved from last night. She reports she is breathing better. She denies heart palpitations, tachycardia; denies N/V. She does reports discomfort, which she believes is gas pains, below her lower sternum; she denies chest pain.  Has been urinating without discomfort and has been able to pass Bms.   Objective:  Vital signs in last 24 hours: Vitals:   06/20/23 0700 06/20/23 0753 06/20/23 0754 06/20/23 0803  BP:  120/63    Pulse: 63 66  68  Resp: 18 19    Temp:  (!) 97.5 F (36.4 C) (!) 97.5 F (36.4 C)   TempSrc:  Oral Axillary   SpO2: 93% 90% 90%   Weight:      Height:       Weight change: -2.2 kg  Intake/Output Summary (Last 24 hours) at 06/20/2023 1009 Last data filed at 06/20/2023 0800 Gross per 24 hour  Intake 679.83 ml  Output 2025 ml  Net -1345.17 ml   Physical Exam General: Pt is laying back in bed, no acute distress. Cardiovascular: RRR, no murmurs, rubs, gallops. Pulmonary: HFNC Normal work of breathing. Exam limited by pt positioning. Abdomen: Normal bowel sounds. Obese; Soft. No tenderness to palpation; no rebound tenderness, no guarding. MSK: No edema of bilateral lower extremities; warm, dry. Neuro/Psych: Alert and oriented to  person, place, event. (Time not formerly assessed.) Normal affect.     Latest Ref Rng & Units 06/20/2023    2:15 AM 06/19/2023   12:34 AM 06/18/2023    1:39 PM  CMP  Glucose 70 - 99 mg/dL 176  160  737   BUN 8 - 23 mg/dL 106  88  82   Creatinine 0.44 - 1.00 mg/dL 2.69  4.85  4.62   Sodium 135 - 145 mmol/L 131  129  134   Potassium 3.5 - 5.1 mmol/L 4.1  4.5  4.3   Chloride 98 - 111 mmol/L 83  82  86   CO2 22 - 32 mmol/L 34  35  36   Calcium 8.9 - 10.3 mg/dL 9.0  9.0  9.4       Latest Ref Rng & Units 06/20/2023    2:15 AM 06/19/2023   12:34 AM 06/17/2023   11:20 PM  CBC  WBC 4.0 - 10.5 K/uL 13.0  11.3  12.6   Hemoglobin 12.0 - 15.0 g/dL 70.3  50.0  9.5   Hematocrit 36.0 - 46.0 % 35.0  32.0  30.8   Platelets 150 - 400 K/uL 368  356  315    VBG 7/28: pH  7.44, pCO2 67, P02 34, Bicarb 45.5  CXR 7/27: No significant change from prior; Similar congestive heart failure compared with 06/16/2023.  Assessment/Plan:  Principal Problem:   Acute on  chronic hypoxic respiratory failure (HCC) Active Problems:   COPD (chronic obstructive pulmonary disease) (HCC)   Chronic respiratory failure with hypoxia, on home oxygen therapy (HCC)   AKI (acute kidney injury) (HCC)   Acute decompensated heart failure (HCC)   Acute on chronic heart failure with preserved ejection fraction [facility])  Alicia [name] is a 67 y.o. female with a PMHx significant for COPD, HFpEF, T2DM, CAD, HTN, GERD who was admitted for acute hypoxic respiratory failure likely multifactorial from acute on chronic HFpEF exacerbation and acute COPD exacerbation, recently complicated by increased O2 needs requiring placement back on BiPAP with subsequent A fib episode, now resolved.  Acute on Chronic hypoxic respiratory failure  COPD exacerbation Acute on chronic HFpEF exacerbation Acute hypoxic respiratory failure is likely do to a mix of fluid overload from HFpEF exacerbation and COPD exacerbation with suspected viral URI trigger  despite negative viral panel. Echocardiogram done 06/16/23 showed LVEF 55-60%, elevated left end diastolic pressure, and IVC dilation which is suggestive of fluid overload. Pt initially weaned off BiPAP to HFNC, but desaturating overnight on 7/27 requiring return of BiPAP. She has been successfully weaned off BiPAP and is back on HFNC (7/28). She has had good diuresis with IV diuretics; now appearing overdiuresed based on increased Cr. And BUN, as well as up trending bicarb on AM labs. At nephrology's recommendation, switching IV diuretics to po torsemide 40 mg BID. - STOP IV Lasix, metolazone - START torsemide 40 mg BID - Continue treatment of COPD exacerbation with Pulmicort, Brovana, ipratropium, levalbuterol; prn guaifenesin, Cepacol lozenges for cough - Continue following recommendations from cardiology, nephrology  Paroxysmal Atrial Fibrillation with RVR, resolved Episode of A fib with RVR following switching pt back to BiPAP for low O2 sats; RVR improved with lopressor treatment. Etiology likely multifactorial as this patient is acutely ill and carries multiple risk factors for atrial fibrillation. Pt switched from Lovenox to Eliquis for stroke prophylaxis as she has a CHADVASC score of 6. Pt back in sinus rhythm today. Will keep pt on tele to monitor. Appreciate cardiology's continued recommendations - Telemetry - Eliquis 5 mg BID for stroke prophylaxis - Lopressor 25 mg BID for rate control   AKI, non oliguric  Potential cardiorenal component of AKI given pt has experienced improved kidney function with diuresis, also had good output urine. Pt now with uptrending Cr of 1.72 from 1.52 yesterday (baseline of 0.9); uptrending BUN of 101 form 88 yesterday, increased Bicarb of 34 (with baseline around 30-32). Suspect pt has been overdiuresed; nephrology recommends stopping IV diuretics and starting torsemide 40 mg BID with consideration of aggressive IV diuretics if hypoxia worsens again. Could also  be steroid component contributing to elevation of labs. 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 Suspect T2DM also cause of chronic proteinuria. Appreciate nephrology's continued recommendations - STOP IV Lasix, metolazone - START torsemide 40 mg BID - Continue to trend Cr   T2DM Home regimen includes Trulicity, Lantus and Humalog. Has been on Semglee, Novolog, SSI in hospital; has required increasing insulin needs with elevated fasting glucoses. Suspect steroids given for COPD exacerbation contributing to hyperglycemia along with minor contribution from IV Lasix given in 50 mL D5 solution.  - Semglee increased to 40 units daily - Novolog increased to 18 units TID - Continue SSI   Nonsustained asymptomatic Vtach Bradycardia with Junctional Rhythm Hx of AV nodal dysfunction. Experienced episode of nonsustained Vtach 7/25, asymptomatic. Cardiology consulted, will follow their  recs. Will continue monitoring K and Mag.  - AVOID AV blocking agents    Leukocytosis During hospitalization, WBC uptrended to 13.5. Downtrended, then increased to 13 on 7/28; suspect element of hemoconcentration. Suspect initial elevation due to steroids, no signs/symptoms of systemic infection.   Chronic Conditions: Normocytic anemia: Stable from prior. Hypertension: Continue amlodipine 10 mg daily. Holding Entresto, spironolactone. Hyperlipidemia, CAD: Continue atorvastatin 80 mg daily, Zetia 10 mg daily, ASA 81 mg daily  Code Status: Full Diet: Renal.carb modified with fluid restriction (1200 mL) IV Fluid: None DVT Prophylaxis: Eliquis 5 mg BID Dispo: Pending reduction in O2 needs; will recommend home with home health OT   LOS: 5 days   Governor Rooks, Medical Student  06/20/2023, 10:09 AM  Attestation for Student Documentation:  I personally was present and re-performed the history, physical exam and medical decision-making  activities of this service and have verified that the service and findings are accurately documented in the student's note.  Morene Crocker, MD 06/20/2023, 10:15 AM

## 2023-06-20 NOTE — Progress Notes (Addendum)
   Patient Name: Alicia [name] Date of Encounter: 06/20/2023 [facility] Cardiologist: Donato Schultz, MD   Interval Summary  .    Feels more tired today than usual.  Vital Signs .    Vitals:   06/20/23 0753 06/20/23 0754 06/20/23 0803 06/20/23 1124  BP: 120/63   130/60  Pulse: 66  68 63  Resp: 19     Temp: (!) 97.5 F (36.4 C) (!) 97.5 F (36.4 C)  98.3 F (36.8 C)  TempSrc: Oral Axillary  Oral  SpO2: 90% 90%    Weight:      Height:        Intake/Output Summary (Last 24 hours) at 06/20/2023 1242 Last data filed at 06/20/2023 1100 Gross per 24 hour  Intake 919.83 ml  Output 2225 ml  Net -1305.17 ml      06/20/2023    4:01 AM 06/19/2023    4:14 AM 06/18/2023    5:00 AM  Last 3 Weights  Weight (lbs) 212 lb 8.4 oz 217 lb 6 oz 220 lb 0.3 oz  Weight (kg) 96.4 kg 98.6 kg 99.8 kg      Telemetry/ECG     Atrial fibrillation with rapid ventricular response overnight- Personally Reviewed  Physical Exam .   GEN: No acute distress.   Neck: No JVD Cardiac: RRR, no murmurs, rubs, or gallops.  Respiratory: Clear to auscultation bilaterally. GI: Soft, nontender, non-distended  MS: No edema  Assessment & Plan .     Acute diastolic heart failure -Stopped IV diuretics, started torsemide 40 mg twice a day.  Appreciate nephrology's assistance.  Paroxysmal atrial fibrillation - Off-and-on episodes.  On metoprolol 25 twice daily as well as Eliquis.  Now back in sinus rhythm.   For questions or updates, please contact [facility] Please consult [email or web address] for contact info under        Signed, Donato Schultz, MD

## 2023-06-20 NOTE — Plan of Care (Signed)
  Problem: Pain Managment: Goal: General experience of comfort will improve Outcome: Completed/Met

## 2023-06-20 NOTE — Discharge Instructions (Addendum)
[name],  It was a pleasure caring for you during admission! You came to the hospital with shortness of breath, which we believe was due to a combination of exacerbations of both your heart failure and your COPD. You were treated with diuresis (making you urinate more to get the extra fluid out of your body) and with inhaled breathing treatments.   For your Heart failure exacerbation, We have Changed the following medication : - Entresto 24-26 mg, take one tablet by mouth two times daily  - Torsemide 40 mg, take one tablet by mouth daily  STOP taking spironolactone   For your COPD exacerbation:  We have started you on the following medication - Trelegy Ellipta, 3 in 1 inhaler, please take 1 puff in your mouth every day for 30 days - Guaifenesin-dextromethorphan 100-10 mg/ 5mL syrup, take 10 mls by mouth at bedtime.  Please take the Prednisone on the correct date and correct dose - Prednisone 40 mg , take one tablet by mouth for 5 days (08/06-08/10) - Prednisone 20 mg, take one tablet by mouth for 5 day (08/11-08/15)  4. For your Hospital Acquired Pneumonia We have started you on the following medication - Levofloxacin (Levaquin) 750 mg, take one tablet once everyday for 7 days.  5. For your nausea/ throat  - Zofran 4 mg, take one tablet by mouth as needed - Chloraseptic spray, one spray in the mouth or throat as needed for throat irritation  6. For your bowels/ constipation - Simethicone (Mylicon) 80 mg, take 1 tablet by mouth every 6 hours as needed for bloating.   7. Hypertension - Please continue amlodipine 10 mg, take one tablet by mouth everyday   8. Hyperlipidemia  - Please continue Lipitor 80 mg and Zetia 10 mg daily  9. T2DM  We have changed the following medication - Insulin lispro 14 units before breakfast and lunch, inject 20 units before dinner - Insulin Glargine 30 units into skin daily   10. Stroke prevention - Eliquis 5mg  BID for stroke prophylaxis   11.  Anxiety: Patient was discharged on hydroxyzine 25 mg q6h as needed. This is not a long term medication and should not continued for long term.    Please follow up with Dr. Theophilus [name] at our [facility] August 14th 2024 at 1:45 PM  If you have any of these following symptoms, please call us or seek care at an emergency department: -Chest Pain -Difficulty Breathing -Worsening abdominal pain -Syncope (passing out) -Drooping of face -Slurred speech -Sudden weakness in your leg or arm -Fever -Chills -blood in the stool -dark black, sticky stool   I know it's taken longer than we all expected to fully recover, but we are glad you're feeling better than you were when you first came to the hospital.   Thank you for allowing [name] to be a part of your care team,  Alicia [name], medical student Dr. Jeral [name], PGY1 Dr. Modena [name], PGY2   Information on my medicine - ELIQUIS (apixaban)  Why was Eliquis prescribed for you? Eliquis was prescribed for you to reduce the risk of a blood clot forming that can cause a stroke if you have a medical condition called atrial fibrillation (a type of irregular heartbeat).  What do You need to know about Eliquis ? Take your Eliquis TWICE DAILY - one tablet in the morning and one tablet in the evening with or without food. If you have difficulty swallowing the tablet whole please discuss with  your pharmacist how to take the medication safely.  Take Eliquis exactly as prescribed by your doctor and DO NOT stop taking Eliquis without talking to the doctor who prescribed the medication.  Stopping may increase your risk of developing a stroke.  Refill your prescription before you run out.  After discharge, you should have regular check-up appointments with your healthcare provider that is prescribing your Eliquis.  In the future your dose may need to be changed if your kidney function or weight changes by a significant amount or  as you get older.  What do you do if you miss a dose? If you miss a dose, take it as soon as you remember on the same day and resume taking twice daily.  Do not take more than one dose of ELIQUIS at the same time to make up a missed dose.  Important Safety Information A possible side effect of Eliquis is bleeding. You should call your healthcare provider right away if you experience any of the following: Bleeding from an injury or your nose that does not stop. Unusual colored urine (red or dark brown) or unusual colored stools (red or black). Unusual bruising for unknown reasons. A serious fall or if you hit your head (even if there is no bleeding).  Some medicines may interact with Eliquis and might increase your risk of bleeding or clotting while on Eliquis. To help avoid this, consult your healthcare provider or pharmacist prior to using any new prescription or non-prescription medications, including herbals, vitamins, non-steroidal anti-inflammatory drugs (NSAIDs) and supplements.  This website has more information on Eliquis (apixaban): http://www.eliquis.com/eliquis/home

## 2023-06-20 NOTE — Progress Notes (Signed)
Pt's sats were in the 70-80's when pt had these coughing spells, RN gave cough med, which did seem to help with the coughing.  When pt would go down on her sats, pt stated she was ok other than these coughing spells.  RT called to help with low sats and breathing status.  RN did change the O2 sat cord a couple of times, but it still showed low sats.  RN bumped up her O2 to 8LHFNC.  The 02 saturations went to 50-60's, RN bumped it up again to 10 L Pt's HR started going in the 130's-140's, on call MD was called.  ECG confirmed ST, on call MD told RN that they were coming up to see the pt.   RT put pt on heated high flow,  but still pt continued to have low sats.  Since this was not working well, RT put pt on bipap on 100%.  RR RN was called to help.  RR RN and MD came to assess pt and help with their tx.  MD ordered chest xray which looked about the same from the previous.  MD also ordered IV metoprolol 2.5 PRN and also PO metoprolol to help with HR.  Pt vagaled down to the 60's 2x for a quick moment and then back to 130's.  Pt's heart rhythm changed as well to Afib after bipap was put on, RT changed O2 percentage to 60% instead of the 100%.  Pt was given IV Metoprolol 2.5 mg, shortly after med was given, pt vagaled down 2 more times but to 40-50's for a few moments then back to 130-140's.  Eventually pt went back to 110-120's after a while on the bipap.  Will continue to monitor, Thanks Lavonda Jumbo RN.

## 2023-06-20 NOTE — Progress Notes (Signed)
   06/19/23 2333  Assess: MEWS Score  BP 108/63  Pulse Rate (!) 119  ECG Heart Rate (!) 123  Resp (!) 21  SpO2 92 %  O2 Device Bi-PAP  FiO2 (%) 60 %  Assess: MEWS Score  MEWS Temp 0  MEWS Systolic 0  MEWS Pulse 2  MEWS RR 1  MEWS LOC 0  MEWS Score 3  MEWS Score Color Yellow  Assess: if the MEWS score is Yellow or Red  Were vital signs accurate and taken at a resting state? Yes  Does the patient meet 2 or more of the SIRS criteria? No  MEWS guidelines implemented  No, previously yellow, continue vital signs every 4 hours  Assess: SIRS CRITERIA  SIRS Temperature  0  SIRS Pulse 1  SIRS Respirations  1  SIRS WBC 0  SIRS Score Sum  2

## 2023-06-20 NOTE — Progress Notes (Signed)
Nephrology Follow-Up Consult note   Assessment/Recommendations: Alicia [name] is a/an 67 y.o. female with a past medical history significant for COPD, HFpEF, DM2, CAD, HTN who present w/ acute on chronic hypoxic respiratory failure   Acute on chronic hypoxic respiratory failure: COPD at baseline.  Sounds like she may have had a virus that caused exacerbation of her COPD but respiratory panel negative -Supplemental oxygen and possible COPD exacerbation management per primary team -Episode of significant desaturations overnight associated with tachycardia.  Further management per primary team.  Could consider evaluation for pulmonary embolism -Diuresis as below   Acute CHF exacerbation: Has diuresed fairly well since admission -Stop IV diuretics and start torsemide 40 mg twice daily -Consider aggressive IV diuretics if hypoxia worsens again -Reported side effect to canagliflozin; could consider a trial of this medication if patient permits -Appreciate cardiology involvement   Non-oliguric AKI: likely cardiorenal.  And significantly improved. BUN rise attributable to steroids -diuretics as above -Continue to monitor daily Cr, Dose meds for GFR -Monitor Daily I/Os, Daily weight  -Maintain MAP>65 for optimal renal perfusion.  -Avoid nephrotoxic medications including NSAIDs -Use synthetic opioids (Fentanyl/Dilaudid) if needed -Currently no indication for HD   Hyperkalemia: Resolved   Type 2 diabetes uncontrolled with hyperglycemia: Management per primary team   Hypertension: Diuresis as above.  Holding some home blood pressure medications including Entresto and spironolactone.  On amlodipine.  Reassuring  Tachycardia: Unclear cause.  Workup and management per primary team.  Cardiology involved   Recommendations conveyed to primary service.    Darnell Level [facility] 06/20/2023 8:38 AM  ___________________________________________________________  CC:   shortness of breath  Interval History/Subjective: Patient had episode of tachycardia with increasing oxygen demand overnight.  Chest x-ray with similar findings previously seen.  Patient states she feels much better this morning.  Creatinine slightly increased to 1.7.  Urine output remains fairly good at 2.7 L made   Medications:  Current Facility-Administered Medications  Medication Dose Route Frequency Provider Last Rate Last Admin   amLODipine (NORVASC) tablet 10 mg  10 mg Oral Daily Champ Mungo, DO   10 mg at 06/20/23 0826   apixaban (ELIQUIS) tablet 5 mg  5 mg Oral BID Stevphen Rochester, RPH   5 mg at 06/20/23 0827   arformoterol (BROVANA) nebulizer solution 15 mcg  15 mcg Nebulization BID Reymundo Poll, MD   15 mcg at 06/20/23 0755   aspirin chewable tablet 81 mg  81 mg Oral Daily Champ Mungo, DO   81 mg at 06/20/23 0826   atorvastatin (LIPITOR) tablet 80 mg  80 mg Oral Daily Alexander-Savino, [place], MD   80 mg at 06/20/23 0826   budesonide (PULMICORT) nebulizer solution 0.25 mg  0.25 mg Nebulization BID Reymundo Poll, MD   0.25 mg at 06/20/23 0755   Chlorhexidine Gluconate Cloth 2 % PADS 6 each  6 each Topical Daily Jake Bathe, MD   6 each at 06/20/23 0827   ezetimibe (ZETIA) tablet 10 mg  10 mg Oral Daily Alexander-Savino, [place], MD   10 mg at 06/20/23 0826   guaiFENesin (ROBITUSSIN) 100 MG/5ML liquid 5 mL  5 mL Oral Q4H PRN Reymundo Poll, MD   5 mL at 06/19/23 2045   hydrOXYzine (VISTARIL) injection 25 mg  25 mg Intramuscular Q6H PRN Champ Mungo, DO   25 mg at 06/16/23 0258   insulin aspart (novoLOG) injection 0-20 Units  0-20 Units Subcutaneous TID WC Faith Rogue, DO   7 Units at 06/20/23  4403   insulin aspart (novoLOG) injection 0-5 Units  0-5 Units Subcutaneous QHS Morene Crocker, MD   5 Units at 06/18/23 2145   insulin aspart (novoLOG) injection 18 Units  18 Units Subcutaneous TID WC Morene Crocker, MD   18 Units at 06/20/23 0828   insulin  glargine-yfgn (SEMGLEE) injection 40 Units  40 Units Subcutaneous Daily Morene Crocker, MD       ipratropium (ATROVENT) nebulizer solution 0.5 mg  0.5 mg Nebulization Q6H PRN Morene Crocker, MD       levalbuterol Pauline Aus) nebulizer solution 0.63 mg  0.63 mg Nebulization Q6H PRN Morene Crocker, MD   0.63 mg at 06/20/23 0755   menthol-cetylpyridinium (CEPACOL) lozenge 3 mg  1 lozenge Oral PRN Champ Mungo, DO       metoprolol tartrate (LOPRESSOR) injection 2.5 mg  2.5 mg Intravenous Q6H PRN Elmon Kirschner, MD   2.5 mg at 06/19/23 2330   metoprolol tartrate (LOPRESSOR) tablet 25 mg  25 mg Oral BID Elmon Kirschner, MD   25 mg at 06/20/23 4742   senna (SENOKOT) tablet 17.2 mg  2 tablet Oral Daily Champ Mungo, DO   17.2 mg at 06/20/23 0826   torsemide (DEMADEX) tablet 40 mg  40 mg Oral BID Darnell Level, MD       trimethoprim-polymyxin b (POLYTRIM) ophthalmic solution 1 drop  1 drop Both Eyes Q4H Champ Mungo, DO   1 drop at 06/20/23 5956      Review of Systems: 10 systems reviewed and negative except per interval history/subjective  Physical Exam: Vitals:   06/20/23 0754 06/20/23 0803  BP:    Pulse:  68  Resp:    Temp: (!) 97.5 F (36.4 C)   SpO2: 90%    Total I/O In: 137.8 [P.O.:50; IV Piggyback:87.8] Out: 250 [Urine:250]  Intake/Output Summary (Last 24 hours) at 06/20/2023 3875 Last data filed at 06/20/2023 0800 Gross per 24 hour  Intake 799.83 ml  Output 2925 ml  Net -2125.17 ml   Constitutional: sitting in bed, no acute distress on bipap ENMT: ears and nose without scars or lesions, MMM CV: normal rate, no edema Respiratory: no iwob, bilateral chest rise Gastrointestinal: soft, non-tender, no palpable masses or hernias Skin: no visible lesions or rashes Psych: alert, judgement/insight appropriate, appropriate mood and affect   Test Results I personally reviewed new and old clinical labs and radiology tests Lab Results  Component Value Date    NA 131 (L) 06/20/2023   K 4.1 06/20/2023   CL 83 (L) 06/20/2023   CO2 34 (H) 06/20/2023   BUN 101 (H) 06/20/2023   CREATININE 1.72 (H) 06/20/2023   GFR 110.83 03/27/2015   CALCIUM 9.0 06/20/2023   ALBUMIN 2.7 (L) 06/20/2023   PHOS 4.5 06/20/2023    CBC Recent Labs  Lab 06/15/23 1659 06/15/23 1711 06/17/23 2320 06/19/23 0034 06/20/23 0215  WBC 9.8   < > 12.6* 11.3* 13.0*  NEUTROABS 8.8*  --   --   --   --   HGB 9.2*   < > 9.5* 10.2* 11.4*  HCT 30.5*   < > 30.8* 32.0* 35.0*  MCV 85.9   < > 84.8 80.2 82.7  PLT 255   < > 315 356 368   < > = values in this interval not displayed.

## 2023-06-20 NOTE — Progress Notes (Signed)
   06/20/23 0401  Assess: MEWS Score  Temp 97.9 F (36.6 C)  BP 123/73  MAP (mmHg) 85  Pulse Rate 62  ECG Heart Rate (!) 110  Resp (!) 25  SpO2 90 %  O2 Device Bi-PAP  FiO2 (%) 60 %  Assess: MEWS Score  MEWS Temp 0  MEWS Systolic 0  MEWS Pulse 1  MEWS RR 1  MEWS LOC 0  MEWS Score 2  MEWS Score Color Yellow  Assess: if the MEWS score is Yellow or Red  Were vital signs accurate and taken at a resting state? Yes  Does the patient meet 2 or more of the SIRS criteria? No  MEWS guidelines implemented  No, previously yellow, continue vital signs every 4 hours  Assess: SIRS CRITERIA  SIRS Temperature  0  SIRS Pulse 1  SIRS Respirations  1  SIRS WBC 1  SIRS Score Sum  3

## 2023-06-20 NOTE — Progress Notes (Signed)
   06/19/23 2318  Assess: MEWS Score  BP 107/76  Pulse Rate (!) 121  O2 Device Bi-PAP  FiO2 (%) 60 %  Assess: MEWS Score  MEWS Temp 0  MEWS Systolic 0  MEWS Pulse 2  MEWS RR 1  MEWS LOC 0  MEWS Score 3  MEWS Score Color Yellow  Assess: if the MEWS score is Yellow or Red  Were vital signs accurate and taken at a resting state? Yes  Does the patient meet 2 or more of the SIRS criteria? No  MEWS guidelines implemented  Yes, yellow  Treat  MEWS Interventions Considered administering scheduled or prn medications/treatments as ordered  Take Vital Signs  Increase Vital Sign Frequency  Yellow: Q2hr x1, continue Q4hrs until patient remains green for 12hrs  Escalate  MEWS: Escalate Yellow: Discuss with charge nurse and consider notifying provider and/or RRT  Assess: SIRS CRITERIA  SIRS Temperature  0  SIRS Pulse 1  SIRS Respirations  1  SIRS WBC 0  SIRS Score Sum  2

## 2023-06-21 ENCOUNTER — Other Ambulatory Visit (HOSPITAL_COMMUNITY): Payer: Self-pay

## 2023-06-21 DIAGNOSIS — J441 Chronic obstructive pulmonary disease with (acute) exacerbation: Secondary | ICD-10-CM | POA: Diagnosis not present

## 2023-06-21 DIAGNOSIS — N179 Acute kidney failure, unspecified: Secondary | ICD-10-CM | POA: Diagnosis not present

## 2023-06-21 DIAGNOSIS — J9621 Acute and chronic respiratory failure with hypoxia: Secondary | ICD-10-CM | POA: Diagnosis not present

## 2023-06-21 DIAGNOSIS — I5032 Chronic diastolic (congestive) heart failure: Secondary | ICD-10-CM

## 2023-06-21 DIAGNOSIS — I5033 Acute on chronic diastolic (congestive) heart failure: Secondary | ICD-10-CM | POA: Diagnosis not present

## 2023-06-21 LAB — GLUCOSE, CAPILLARY
Glucose-Capillary: 90 mg/dL (ref 70–99)
Glucose-Capillary: 181 mg/dL — ABNORMAL HIGH (ref 70–99)
Glucose-Capillary: 221 mg/dL — ABNORMAL HIGH (ref 70–99)
Glucose-Capillary: 168 mg/dL — ABNORMAL HIGH (ref 70–99)

## 2023-06-21 MED ORDER — TORSEMIDE 20 MG PO TABS
40.0000 mg | ORAL_TABLET | Freq: Every day | ORAL | Status: DC
  Administered 2023-06-21 – 2023-06-22 (×2): 40 mg via ORAL
  Filled 2023-06-21 (×2): qty 2

## 2023-06-21 MED ORDER — INSULIN GLARGINE-YFGN 100 UNIT/ML ~~LOC~~ SOLN
10.0000 [IU] | Freq: Once | SUBCUTANEOUS | Status: AC
  Administered 2023-06-21: 10 [IU] via SUBCUTANEOUS
  Filled 2023-06-21: qty 0.1

## 2023-06-21 MED ORDER — POTASSIUM CHLORIDE CRYS ER 20 MEQ PO TBCR
40.0000 meq | EXTENDED_RELEASE_TABLET | Freq: Once | ORAL | Status: AC
  Administered 2023-06-21: 40 meq via ORAL
  Filled 2023-06-21: qty 2

## 2023-06-21 MED ORDER — INSULIN GLARGINE-YFGN 100 UNIT/ML ~~LOC~~ SOLN
20.0000 [IU] | Freq: Every day | SUBCUTANEOUS | Status: DC
  Administered 2023-06-21: 20 [IU] via SUBCUTANEOUS
  Filled 2023-06-21 (×2): qty 0.2

## 2023-06-21 NOTE — Plan of Care (Signed)
  Problem: Nutrition: Goal: Adequate nutrition will be maintained Outcome: Completed/Met

## 2023-06-21 NOTE — Inpatient Diabetes Management (Signed)
Inpatient Diabetes Program Recommendations  AACE/ADA: New Consensus Statement on Inpatient Glycemic Control   Target Ranges:  Prepandial:   less than 140 mg/dL      Peak postprandial:   less than 180 mg/dL (1-2 hours)      Critically ill patients:  140 - 180 mg/dL    Latest Reference Range & Units 06/20/23 05:23 06/20/23 11:28 06/20/23 15:47 06/20/23 20:44 06/21/23 05:26 06/21/23 11:20  Glucose-Capillary 70 - 99 mg/dL 119 (H) 147 (H) 829 (H) 236 (H) 90 181 (H)   Review of Glycemic Control  Diabetes history: DM2 Outpatient Diabetes medications: Lantus 20 units daily, Humalog 14 units with breakfast, Humalog 14 units with lunch, Humalog 20 units with supper, Trulicity 1.5 mg Qweek Current orders for Inpatient glycemic control: Semglee 20 units daily, Novolog 0-20 units TID with meals, Novolog 0-5 units at bedtime, Novolog 18 units TID with meals  Inpatient Diabetes Program Recommendations:    Insulin: Noted steroids no longer ordered and Semglee was decreased from 40 to 20 units daily today.  Thanks, Orlando Penner, RN, MSN, CDCES Diabetes Coordinator Inpatient Diabetes Program [phone] (Team Pager from 8am to 5pm)

## 2023-06-21 NOTE — Progress Notes (Signed)
Physical Therapy Treatment Patient Details Name: Alicia [name] MRN: [id number] DOB: Apr 05, 1956 Today's Date: 06/21/2023   History of Present Illness 67 y.o. female presents to [facility] hospital on 06/15/2023 with SOB, cough and ear pain. Pt required BiPAP in the ED due to hypoxia. Pt admitted for CHF and COPD exacerbation 2/2 URI. 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    PT Comments  Pt on HHFNC upon PT arrival to room, states her breathing feels improved subjectively. Pt tolerated multiple stand pivot transfers OOB this date with use of RW and light steadying assist, PT encouraging frequent rest breaks between bouts of activity to recover dyspnea and maintain SpO2 88% and greater. Pt reports feeling weaker vs baseline, but is pleased to be OOB to recliner at end of session satting 97%. PT to continue to follow.   35 L/min, 40% FiO2 and SPO2 88% and greater throughout    If plan is discharge home, recommend the following: Assist for transportation;Help with stairs or ramp for entrance;A little help with walking and/or transfers;A little help with bathing/dressing/bathroom;A lot of help with walking and/or transfers;Assistance with cooking/housework   Can travel by private vehicle        Equipment Recommendations  None recommended by PT    Recommendations for Other Services       Precautions / Restrictions Precautions Precautions: Fall Precaution Comments: monitor HR and sats, 35 L/min and 40% FiO2 Restrictions Weight Bearing Restrictions: No     Mobility  Bed Mobility Overal bed mobility: Needs Assistance Bed Mobility: Supine to Sit     Supine to sit: Min assist, HOB elevated     General bed mobility comments: assist for trunk elevation off bed.    Transfers Overall transfer level: Needs assistance Equipment used: Rolling walker (2 wheels) Transfers: Sit to/from Stand, Bed to  chair/wheelchair/BSC Sit to Stand: Min assist   Step pivot transfers: Min assist       General transfer comment: assist for rise, steady, and pivot to recliner, BSC, then back to recliner    Ambulation/Gait               General Gait Details: nt   Stairs             Wheelchair Mobility     Tilt Bed    Modified Rankin (Stroke Patients Only)       Balance Overall balance assessment: Needs assistance Sitting-balance support: Bilateral upper extremity supported Sitting balance-Leahy Scale: Good     Standing balance support: Bilateral upper extremity supported, No upper extremity supported, Reliant on assistive device for balance Standing balance-Leahy Scale: Poor                              Cognition Arousal/Alertness: Awake/alert Behavior During Therapy: WFL for tasks assessed/performed Overall Cognitive Status: Within Functional Limits for tasks assessed                                          Exercises      General Comments General comments (skin integrity, edema, etc.): 35 L/min, 40% FiO2 and SPO2 88% and greater throughout when given frequent rest breaks and focus on breathing technique      Pertinent Vitals/Pain Pain Assessment Pain Assessment: No/denies pain  Faces Pain Scale: No hurt    Home Living                          Prior Function            PT Goals (current goals can now be found in the care plan section) Acute Rehab PT Goals Patient Stated Goal: to get her strength back PT Goal Formulation: With patient Time For Goal Achievement: 07/01/23 Potential to Achieve Goals: Good Progress towards PT goals: Progressing toward goals    Frequency    Min 1X/week      PT Plan Current plan remains appropriate    Co-evaluation              AM-PAC PT "6 Clicks" Mobility   Outcome Measure  Help needed turning from your back to your side while in a flat bed without using  bedrails?: A Little Help needed moving from lying on your back to sitting on the side of a flat bed without using bedrails?: A Little Help needed moving to and from a bed to a chair (including a wheelchair)?: A Little Help needed standing up from a chair using your arms (e.g., wheelchair or bedside chair)?: A Little Help needed to walk in hospital room?: Total Help needed climbing 3-5 steps with a railing? : Total 6 Click Score: 14    End of Session Equipment Utilized During Treatment: Oxygen Activity Tolerance: Patient limited by fatigue Patient left: in chair;with call bell/phone within reach;with chair alarm set Nurse Communication: Mobility status PT Visit Diagnosis: Muscle weakness (generalized) (M62.81);Difficulty in walking, not elsewhere classified (R26.2)     Time: 1206-1238 PT Time Calculation (min) (ACUTE ONLY): 32 min  Charges:    $Therapeutic Activity: 23-37 mins PT General Charges $$ ACUTE PT VISIT: 1 Visit                     Alicia [name], PT DPT Acute Rehabilitation Services Secure Chat Preferred  Office [phone]    Alicia [name] Sheliah [name] 06/21/2023, 2:29 PM

## 2023-06-21 NOTE — Progress Notes (Signed)
   Heart Failure Stewardship Pharmacist Progress Note   PCP: [name], MD PCP-Cardiologist: Donato Schultz, MD    HPI:  67 yo F with PMH of COPD, oxygen dependent, CHF (EF 20-25% in 2015, recovered to 55-60%), CAD, HLD, GERD, T2DM, and CVA.    Admitted in Nov 2023 with acute on chronic CHF exacerbation. Diuresed w/ IV Lasix and treated w/ abx, steroids and nebs. Echo showed normal EF 55-60% and normal RV. After diureses, was transitioned to PO Lasix. Also placed on Entresto.   She was seen in HF TOC 10/2022. Jardiance increased to 10 mg daily and spironlactone added. She was seen back in [facility] 11/2022 and spironolactone was increased to 25 mg daily. She was later seen at [facility] on 1/30 and lasix was decreased to 80 mg AM and 40 mg PM.  Admitted 01/2023 with acute on chronic CHF exacerbation. ECHO 3/17 showed LVEF 60-65%. Diuresed with IV lasix 120 mg, metolazone, and diamox. Discharged on Eentresto, spironolactone, amlodipine, and lasix. [name] held due to UTI.   Presented to the ED on 7/23 with increasing O2 needs and desaturations. CXR showed pulmonary congestion. BNP elevated. ECHO 7/24 showed LVEF 50-60%, G3DD, no RWMA, RV normal, moderately elevated pA pressure.   Current HF Medications: Diuretic: torsemide 40 mg daily Beta Blocker: metoprolol tartrate 25 mg BID  Prior to admission HF Medications: Diuretic: torsemide 60 mg daily ACE/ARB/ARNI: Entresto 97/103 mg BID MRA: spironolactone 25 mg daily  Pertinent Lab Values: Serum creatinine 1.93, BUN 111, Potassium 3.3, Sodium 132, BNP 3750.1, Magnesium 1.9, A1c 8.2   Vital Signs: Weight: 207 lbs (admission weight: 221 lbs) Blood pressure: 100/60s  Heart rate: 60-70s  I/O: net -1.3L yesterday; net -10.8L since admission  Medication Assistance / Insurance Benefits Check: Does the patient have prescription insurance?  Yes Type of insurance plan: [place] [employer]  Outpatient Pharmacy:  Prior to admission [facility] Is the patient willing to use [facility] TOC pharmacy at discharge? Yes Is the patient willing to transition their outpatient pharmacy to utilize a [facility] outpatient pharmacy?   Pending    Assessment: 1. 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 NYHA class III symptoms. - Continue torsemide 40 mg daily, reduced from BID today. Strict I/Os and daily weights. Keep K>4 and Mg>2. - Continue metoprolol tartrate 25 mg BID. History of bradycardia. Rates have been ok. Continue to monitor.  - Holding Entresto and spironolactone with AKI - creatinine bump today. - Held C[place] last admission with UTI - consider restarting this admission if renal function stabillizes  Plan: 1) Medication changes recommended at this time: - Agree with changes  2) Patient assistance: - None pending, has [place] Medicaid   3)  Education  - Patient has been educated on current HF medications and potential additions to HF medication regimen - Patient verbalizes understanding that over the next few months, these medication doses may change and more medications may be added to optimize HF regimen - Patient has been educated on basic disease state pathophysiology and goals of therapy   Sharen Hones, PharmD, BCPS Heart Failure Stewardship Pharmacist Phone [phone]

## 2023-06-21 NOTE — TOC Benefit Eligibility Note (Signed)
Pharmacy Patient Advocate Encounter  Insurance verification completed.    The patient is insured through [facility]    Ran test claim for Eliquis and the current 30 day co-pay is $0.00.   This test claim was processed through [facility]- copay amounts may vary at other pharmacies due to pharmacy/plan contracts, or as the patient moves through the different stages of their insurance plan.

## 2023-06-21 NOTE — Progress Notes (Signed)
Subjective:  Alicia [name] is a 67 y.o. female with a PMHx significant for COPD, HFpEF, T2DM, CAD, HTN, GERD who was admitted for acute hypoxic respiratory failure likely multifactorial from acute on chronic HFpEF exacerbation and acute COPD exacerbation.  Pt was examined at bedside today. She reports she is feeling well. Her breathing has been stable and she has not required BiPAP in past 24 hours. She reports some nausea after eating but denies vomiting; this is stable. She denies CP, pressure. She is hoping she can get up out of bed some. She is currently on 40% FiO2 via HFNC and is well saturated at rest with O2 in high 90s; however, when pt was maneuvered during physical exam, she desated to 80s. She improved back to 90s after a few minutes resting and taking slow, deep breaths.   Pt to see chaplain to make her daughter her healthcare power of attorney in the event that she were to be unable to communicate her own medical decisions.  Objective:  Vital signs in last 24 hours: Vitals:   06/21/23 0817 06/21/23 1006 06/21/23 1219 06/21/23 1316  BP: 100/61  109/69   Pulse: 69 66 70   Resp: 13 17 20 20   Temp: 98.3 F (36.8 C)  98 F (36.7 C)   TempSrc: Oral  Oral   SpO2: 96% 98% 96%   Weight:      Height:       Weight change: -2.1 kg  Intake/Output Summary (Last 24 hours) at 06/21/2023 1403 Last data filed at 06/21/2023 1301 Gross per 24 hour  Intake 420 ml  Output 775 ml  Net -355 ml   Physical Exam General: Pt is laying back in hospital bed with head of bed elevated. No acute distress. Cardiovascular: RRR, no murmurs, rubs, gallops. No appreciable JVD, jugulo-hepatic reflex. Pulmonary: Pt is on HFNC at 40%. Normal work of breathing, occasional cough. Wheezing present, improved from prior. Pt did subjectively report dizziness after being asked to reposition/lean forward to listen to posterior breath sounds. Abdomen: Normal bowel sounds. Soft and nondistended in all four  quadrants. No tenderness to palpation; no rebound tenderness, no guarding. MSK: No edema of bilateral lower extremities; warm, dry. Neuro/Psych: Alert and oriented to person, place, event. (Time not formerly assessed.) Normal affect.     Latest Ref Rng & Units 06/21/2023    2:35 AM 06/20/2023    2:15 AM 06/19/2023   12:34 AM  CMP  Glucose 70 - 99 mg/dL 90  657  846   BUN 8 - 23 mg/dL 962  952  88   Creatinine 0.44 - 1.00 mg/dL 8.41  3.24  4.01   Sodium 135 - 145 mmol/L 132  131  129   Potassium 3.5 - 5.1 mmol/L 3.3  4.1  4.5   Chloride 98 - 111 mmol/L 83  83  82   CO2 22 - 32 mmol/L 36  34  35   Calcium 8.9 - 10.3 mg/dL 8.7  9.0  9.0   Phos 0/27: 5.4  Alb 7/29: 2.6  Mag 7/29: 1.9     Latest Ref Rng & Units 06/21/2023    2:35 AM 06/20/2023    2:15 AM 06/19/2023   12:34 AM  CBC  WBC 4.0 - 10.5 K/uL 11.4  13.0  11.3   Hemoglobin 12.0 - 15.0 g/dL 25.3  66.4  40.3   Hematocrit 36.0 - 46.0 % 35.5  35.0  32.0   Platelets 150 - 400 K/uL  348  368  356    Assessment/Plan:  Principal Problem:   Acute on chronic hypoxic respiratory failure (HCC) Active Problems:   COPD (chronic obstructive pulmonary disease) (HCC)   Chronic respiratory failure with hypoxia, on home oxygen therapy (HCC)   AKI (acute kidney injury) (HCC)   Acute decompensated heart failure (HCC)   Acute on chronic heart failure with preserved ejection fraction [facility])  Alicia [name] is a 67 y.o. female with a PMHx significant for COPD, HFpEF, T2DM, CAD, HTN, GERD who was admitted for acute hypoxic respiratory failure likely multifactorial from acute on chronic HFpEF exacerbation and acute COPD exacerbation.  Acute on Chronic hypoxic respiratory failure  COPD exacerbation Acute on chronic HFpEF exacerbation Acute hypoxic respiratory failure is likely due to a combination of fluid overload from HFpEF exacerbation and COPD exacerbation with suspected viral URI trigger despite negative viral panel. Echocardiogram done  06/16/23 showed LVEF 55-60%, elevated left end diastolic pressure, and IVC dilation suggestive of fluid overload. Pt initially weaned off BiPAP to HFNC, but desaturating overnight on 7/27 requiring return of BiPAP. She was then able to wean back to HFNC on 7/28, remains with stable O2 sats on HFNC on 7/29. Given signs of overdiuresis (see below for further explanation), decreasing torsemide to 40 mg daily from BID dosing. Can consider restarting [name] if renal function stabilizes (previously held last admission with UTI). Goal K>4 and Mag>2, will continue to monitor. Will reassess venous blood gas tomorrow (7/30). Goal of titrating pt's O2 needs back towards baseline of 4-5L via [place]; pt might have increased O2 requirements at discharge. - REDUCE torsemide from 40 mg BID to 40 mg daily - Continue treatment of COPD exacerbation with Pulmicort, Brovana, ipratropium, levalbuterol; prn guaifenesin, Cepacol lozenges for cough; discharge on triple therapy inhaler - Continue to monitor metabolic panels - Repeat VBG 7/30 am - Appreciate continued recommendations from cardiology  AKI, non oliguric  Potential cardiorenal component of AKI given pt has experienced improved kidney function with diuresis, also had good urine output. Baseline Cr of 0.9. Pt has been experiencing uptrending Cr for past two days; currently at 1.93 with elevated BUN to 111. Suspect overdiuresis; pt was switched from IV Lasix 120 mg TID to po torsemide 40 mg BID on 7/28, however Cr has continued to elevate. Given signs of overdiuresis, will switch pt from BID torsemide to daily torsemide; will continue to reassess pt's diuresis needs with Nephrology recommending reinstating aggressive IV diuretics if hypoxia worsens again. Nephrology has signed off. Foley was removed 7/29; will do a voiding trial with bladder scan to be done 6 hours after Foley removal. - REDUCE torsemide from 40 mg BID to 40 mg daily - Bladder scan @2100  on 7/29 - Continue  to trend Cr  T2DM Home regimen includes Trulicity, Lantus and Humalog. Has been on Semglee, Novolog, SSI in hospital; has required increasing insulin needs with elevated fasting glucoses. Suspect steroids given for COPD exacerbation contributing to hyperglycemia. Pt now with normal fasting CBG of 90 on Semglee 40 units daily, Novolog 18 units TID, SSI; Semglee given at 20 units this am over concerns of hypoglycemia given pt now off steroids. Repeat CBG of 181 at 11:20am. - Continue Semglee 40 units daily - Continue Novolog 18 units TID - Continue SSI   Nonsustained asymptomatic Vtach Bradycardia with Junctional Rhythm Paroxysmal Atrial Fibrillation with RVR Hx of AV nodal dysfunction. Experienced episode of nonsustained Vtach 7/25, asymptomatic. Episode of A fib with RVR following switching pt back  to BiPAP for low O2 sats; RVR improved with lopressor treatment. Etiology likely multifactorial as this patient is acutely ill and carries multiple risk factors for atrial fibrillation. Pt switched from Lovenox to Eliquis for stroke prophylaxis as she has a CHADVASC score of 6. Pt remains in sinus rhythm today (7/29). Will continue to monitor on tele. Appreciate cardiology's continued recommendations. - Continue telemetry - AVOID AV blocking agents  - Eliquis 5 mg BID for stroke prophylaxis - Lopressor 25 mg BID for rate control  Chronic Conditions: Normocytic anemia: Stable. Hypertension: Holding amlodipine 10 mg daily at nephro's rec. Holding Entresto, spironolactone. Hyperlipidemia, CAD: Continue atorvastatin 80 mg daily, Zetia 10 mg daily, ASA 81 mg daily  Code Status: Full Diet: Renal/carb modified with fluid restriction ( ) IV Fluid: None DVT Prophylaxis: Eliquis 5 mg BID Dispo: Pending reduction on O2 needs; home with home health OT   LOS: 6 days   Governor Rooks, Medical Student  06/21/2023, 2:03 PM

## 2023-06-21 NOTE — Progress Notes (Signed)
Patient Name: Alicia [name] Date of Encounter: 06/21/2023 [facility] Cardiologist: Donato Schultz, MD   Interval Summary  .   67 yo female admitted 07/23 w/ acute on chronic HFpEF, PAF.  Got some sleep last pm, breathing ok today but has not been out of bed.   Vital Signs .    Vitals:   06/20/23 2001 06/20/23 2027 06/21/23 0043 06/21/23 0516  BP:  (!) 107/58 (!) 102/52 118/60  Pulse:  66 62 62  Resp:  19 12 18   Temp:  98.5 F (36.9 C) 98.2 F (36.8 C) 98.4 F (36.9 C)  TempSrc:  Oral Oral Oral  SpO2: 99% 97% 94% 99%  Weight:    94.3 kg  Height:        Intake/Output Summary (Last 24 hours) at 06/21/2023 0825 Last data filed at 06/21/2023 0500 Gross per 24 hour  Intake 420 ml  Output 975 ml  Net -555 ml      06/21/2023    5:16 AM 06/20/2023    4:01 AM 06/19/2023    4:14 AM  Last 3 Weights  Weight (lbs) 207 lb 14.3 oz 212 lb 8.4 oz 217 lb 6 oz  Weight (kg) 94.3 kg 96.4 kg 98.6 kg      Telemetry/ECG    No Afib in > 24 hr, SR w/ PVCs, less frequent than yesterday - Personally Reviewed  Physical Exam .    General: Well developed, well nourished, female in no acute distress Head: Eyes PERRLA, Head normocephalic and atraumatic Lungs: decreased BS bases Heart: HRRR S1 S2, without rub or gallop. No murmur. 4/4 extremity pulses are 2+ & equal. No JVD. Abdomen: Bowel sounds are present, abdomen soft and non-tender without masses or  hernias noted. Msk: Normal strength and tone for age. Extremities: No clubbing, cyanosis or edema.    Skin:  No rashes or lesions noted. Neuro: Alert and oriented X 3. Psych:  Good affect, responds appropriately      Latest Ref Rng & Units 06/21/2023    2:35 AM 06/20/2023    2:15 AM 06/19/2023   12:34 AM  BMP  Glucose 70 - 99 mg/dL 90  301  601   BUN 8 - 23 mg/dL 093  235  88   Creatinine 0.44 - 1.00 mg/dL 5.73  2.20  2.54   Sodium 135 - 145 mmol/L 132  131  129   Potassium 3.5 - 5.1 mmol/L 3.3  4.1  4.5   Chloride 98 -  111 mmol/L 83  83  82   CO2 22 - 32 mmol/L 36  34  35   Calcium 8.9 - 10.3 mg/dL 8.7  9.0  9.0      Assessment & Plan .     Acute diastolic heart failure - got IV Lasix 60-80 mg thru 07/24, then Lasix 120 mg tid with metolazone 5 mg bid thru 07/27, got torsemide 40 mg 07/28 - diuretics d/c'd 2nd Cr 1.93, above her baseline - Nephrology following - volume status is good by exam  - off home Entresto 97-103 and spiro 25 mg are on hold. PTA on torsemide 60 mg qd  Paroxysmal atrial fibrillation - Off-and-on episodes, but none in > 24 hr - continue metoprolol 25 twice daily - continue Eliquis 5 mg bid  - no dose increase on BB since SBP 100s at times - maintaining sinus rhythm (w/ PVCs) - K+ is 3.3, will message IM to see if ok to supplement  Otherwise,  per IM, Neprhology  For questions or updates, please contact [facility] Please consult [email or web address] for contact info under        Signed, Theodore Demark, PA-C

## 2023-06-21 NOTE — Progress Notes (Signed)
[facility] Progress Note   Assessment/ Plan:   Alicia [name] is a/an 67 y.o. female with a past medical history significant for COPD, HFpEF, DM2, CAD, HTN who present w/ acute on chronic hypoxic respiratory failure   Acute on chronic hypoxic respiratory failure:  -Supplemental oxygen and possible COPD exacerbation management per primary team-- off steroids this AM -Episode of significant desaturations overnight associated with tachycardia.  Further management per primary team.  Could consider evaluation for pulmonary embolism -Diuresis as below   Acute CHF exacerbation: Has diuresed fairly well since admission - decrease torsemide to 40 mg daily for now - can increase if hypoxia worsens,   Non-oliguric AKI: likely cardiorenal.  And significantly improved. BUN rise attributable to steroids -diuretics as above -Continue to monitor daily Cr, Dose meds for GFR -Monitor Daily I/Os, Daily weight  -Maintain MAP>65 for optimal renal perfusion.  -Avoid nephrotoxic medications including NSAIDs -Use synthetic opioids (Fentanyl/Dilaudid) if needed -Currently no indication for HD   Hyperkalemia: Resolved   Type 2 diabetes uncontrolled with hyperglycemia: Management per primary team   Hypertension: Diuresis as above.  Holding some home blood pressure medications including Entresto and spironolactone. Stop amlodipine today as well- allow BP to drift up which will allow more diuresis   Tachycardia: Unclear cause.  Workup and management per primary team.  Cardiology involved  Dispo: nothing else to add for now.  Anticipate improvement in BUN especially since steroids are stopped now.  Will sign off. Call with quesitons.   Subjective:    NAD, sitting in bed.  BP a little low   Objective:   BP 100/61 (BP Location: Left Arm)   Pulse 66   Temp 98.3 F (36.8 C) (Oral)   Resp 17   Ht 5\' 7"  (1.702 m)   Wt 94.3 kg   LMP  (LMP Unknown)   SpO2 98%   BMI 32.56 kg/m    Intake/Output Summary (Last 24 hours) at 06/21/2023 1051 Last data filed at 06/21/2023 0500 Gross per 24 hour  Intake 420 ml  Output 975 ml  Net -555 ml   Weight change: -2.1 kg  Physical Exam: Gen:NAD, sitting in bed, + nasal pillows in place CVS: RRR Resp: slightly wheezy GEX:BMWU Ext: no LE edema  Imaging: DG CHEST PORT 1 VIEW  Result Date: 06/19/2023 CLINICAL DATA:  Shortness of breath EXAM: PORTABLE CHEST 1 VIEW COMPARISON:  Radiographs 06/16/2023 FINDINGS: No significant change from prior. Cardiomegaly. Bilateral interstitial and airspace opacities greatest in the left lower lung. Small left pleural effusion. No pneumothorax. IMPRESSION: Similar congestive heart failure compared with 06/16/2023. Electronically Signed   By: Minerva Fester M.D.   On: 06/19/2023 23:06    Labs: BMET Recent Labs  Lab 06/17/23 0450 06/17/23 0917 06/17/23 1200 06/17/23 1409 06/17/23 1549 06/17/23 1932 06/17/23 2320 06/18/23 0755 06/18/23 1339 06/19/23 0034 06/20/23 0215 06/21/23 0235  NA 133*  --  134* 132*  --   --  133*  --  134* 129* 131* 132*  K 5.3*   < > 5.4* 4.9   < > 5.1 4.8 4.1 4.3 4.5 4.1 3.3*  CL 92*  --  93* 92*  --   --  89*  --  86* 82* 83* 83*  CO2 31  --  28 29  --   --  32  --  36* 35* 34* 36*  GLUCOSE 263*  --  279* 398*  --   --  412*  --  179* 460* 255*  90  BUN 81*  --  81* 84*  --   --  92*  --  82* 88* 101* 111*  CREATININE 1.81*  --  1.74* 1.80*  --   --  1.70*  --  1.47* 1.52* 1.72* 1.93*  CALCIUM 8.8*  --  8.9 8.7*  --   --  9.1  --  9.4 9.0 9.0 8.7*  PHOS 4.7*  --  4.4  --   --   --  3.5  --  3.1 3.3 4.5 5.4*   < > = values in this interval not displayed.   CBC Recent Labs  Lab 06/15/23 1659 06/15/23 1711 06/17/23 2320 06/19/23 0034 06/20/23 0215 06/21/23 0235  WBC 9.8   < > 12.6* 11.3* 13.0* 11.4*  NEUTROABS 8.8*  --   --   --   --   --   HGB 9.2*   < > 9.5* 10.2* 11.4* 11.5*  HCT 30.5*   < > 30.8* 32.0* 35.0* 35.5*  MCV 85.9   < > 84.8 80.2  82.7 82.2  PLT 255   < > 315 356 368 348   < > = values in this interval not displayed.    Medications:     apixaban  5 mg Oral BID   arformoterol  15 mcg Nebulization BID   aspirin  81 mg Oral Daily   atorvastatin  80 mg Oral Daily   budesonide (PULMICORT) nebulizer solution  0.25 mg Nebulization BID   Chlorhexidine Gluconate Cloth  6 each Topical Daily   ezetimibe  10 mg Oral Daily   insulin aspart  0-20 Units Subcutaneous TID WC   insulin aspart  0-5 Units Subcutaneous QHS   insulin aspart  18 Units Subcutaneous TID WC   insulin glargine-yfgn  20 Units Subcutaneous Daily   metoprolol tartrate  25 mg Oral BID   senna  2 tablet Oral Daily   torsemide  40 mg Oral Daily   trimethoprim-polymyxin b  1 drop Both Eyes Q4H    Bufford Buttner, MD 06/21/2023, 10:51 AM

## 2023-06-21 NOTE — Plan of Care (Signed)
  Problem: Education: Goal: Ability to describe self-care measures that may prevent or decrease complications (Diabetes Survival Skills Education) will improve Outcome: Progressing   Problem: Coping: Goal: Ability to adjust to condition or change in health will improve Outcome: Progressing   Problem: Fluid Volume: Goal: Ability to maintain a balanced intake and output will improve Outcome: Progressing   

## 2023-06-21 NOTE — Plan of Care (Signed)
  Problem: Education: Goal: Ability to describe self-care measures that may prevent or decrease complications (Diabetes Survival Skills Education) will improve Outcome: Progressing   Problem: Coping: Goal: Ability to adjust to condition or change in health will improve Outcome: Progressing   Problem: Fluid Volume: Goal: Ability to maintain a balanced intake and output will improve Outcome: Progressing   Problem: Health Behavior/Discharge Planning: Goal: Ability to identify and utilize available resources and services will improve Outcome: Progressing Goal: Ability to manage health-related needs will improve Outcome: Progressing   Problem: Metabolic: Goal: Ability to maintain appropriate glucose levels will improve Outcome: Progressing   Problem: Nutritional: Goal: Maintenance of adequate nutrition will improve Outcome: Progressing Goal: Progress toward achieving an optimal weight will improve Outcome: Progressing   Problem: Skin Integrity: Goal: Risk for impaired skin integrity will decrease Outcome: Progressing   Problem: Tissue Perfusion: Goal: Adequacy of tissue perfusion will improve Outcome: Progressing   Problem: Education: Goal: Knowledge of General Education information will improve Description: Including pain rating scale, medication(s)/side effects and non-pharmacologic comfort measures Outcome: Progressing   Problem: Health Behavior/Discharge Planning: Goal: Ability to manage health-related needs will improve Outcome: Progressing   Problem: Clinical Measurements: Goal: Ability to maintain clinical measurements within normal limits will improve Outcome: Progressing Goal: Will remain free from infection Outcome: Progressing Goal: Diagnostic test results will improve Outcome: Progressing Goal: Respiratory complications will improve Outcome: Progressing Goal: Cardiovascular complication will be avoided Outcome: Progressing   Problem: Activity: Goal:  Risk for activity intolerance will decrease Outcome: Progressing   Problem: Coping: Goal: Level of anxiety will decrease Outcome: Progressing   Problem: Elimination: Goal: Will not experience complications related to bowel motility Outcome: Progressing Goal: Will not experience complications related to urinary retention Outcome: Progressing   Problem: Safety: Goal: Ability to remain free from injury will improve Outcome: Progressing   Problem: Skin Integrity: Goal: Risk for impaired skin integrity will decrease Outcome: Progressing

## 2023-06-21 NOTE — Progress Notes (Signed)
   06/21/23 1141  Spiritual Encounters  Type of Visit Initial  Care provided to: Patient  Conversation partners present during encounter Nurse  Referral source Patient request  Reason for visit Advance directives  OnCall Visit No   Chaplain responded to Spiritual Consult for Advance Care directive and delivered ACD education to Pt. Pt stated she wants her daughter to be HCPOA and her daughter will help her fill out the paperwork.  Chaplain gave instructions on how to reach out if she has questions with paperwork and/or is ready to move forward with notarization.  Paperwork left with Pt on the window sill.   Chaplain services remain available by Spiritual Consult or for emergent cases, paging [phone]  Chaplain Raelene Bott, MDiv [email or web address] [phone]

## 2023-06-22 DIAGNOSIS — I5033 Acute on chronic diastolic (congestive) heart failure: Secondary | ICD-10-CM | POA: Diagnosis not present

## 2023-06-22 DIAGNOSIS — J441 Chronic obstructive pulmonary disease with (acute) exacerbation: Secondary | ICD-10-CM | POA: Diagnosis not present

## 2023-06-22 DIAGNOSIS — I5032 Chronic diastolic (congestive) heart failure: Secondary | ICD-10-CM

## 2023-06-22 DIAGNOSIS — N179 Acute kidney failure, unspecified: Secondary | ICD-10-CM | POA: Diagnosis not present

## 2023-06-22 DIAGNOSIS — J9601 Acute respiratory failure with hypoxia: Secondary | ICD-10-CM

## 2023-06-22 DIAGNOSIS — J9621 Acute and chronic respiratory failure with hypoxia: Secondary | ICD-10-CM | POA: Diagnosis not present

## 2023-06-22 LAB — GLUCOSE, CAPILLARY
Glucose-Capillary: 194 mg/dL — ABNORMAL HIGH (ref 70–99)
Glucose-Capillary: 202 mg/dL — ABNORMAL HIGH (ref 70–99)
Glucose-Capillary: 216 mg/dL — ABNORMAL HIGH (ref 70–99)
Glucose-Capillary: 201 mg/dL — ABNORMAL HIGH (ref 70–99)

## 2023-06-22 MED ORDER — INSULIN GLARGINE-YFGN 100 UNIT/ML ~~LOC~~ SOLN
30.0000 [IU] | Freq: Every day | SUBCUTANEOUS | Status: DC
  Administered 2023-06-22 – 2023-06-28 (×7): 30 [IU] via SUBCUTANEOUS
  Filled 2023-06-22 (×7): qty 0.3

## 2023-06-22 MED ORDER — IPRATROPIUM BROMIDE 0.02 % IN SOLN
0.5000 mg | Freq: Four times a day (QID) | RESPIRATORY_TRACT | Status: DC
  Administered 2023-06-22 – 2023-06-25 (×12): 0.5 mg via RESPIRATORY_TRACT
  Filled 2023-06-22 (×12): qty 2.5

## 2023-06-22 MED ORDER — ACETAMINOPHEN 325 MG PO TABS
650.0000 mg | ORAL_TABLET | Freq: Once | ORAL | Status: DC
  Filled 2023-06-22: qty 2

## 2023-06-22 MED ORDER — IPRATROPIUM BROMIDE 0.02 % IN SOLN
0.5000 mg | Freq: Three times a day (TID) | RESPIRATORY_TRACT | Status: DC
  Administered 2023-06-22: 0.5 mg via RESPIRATORY_TRACT
  Filled 2023-06-22: qty 2.5

## 2023-06-22 MED ORDER — ACETAMINOPHEN 325 MG PO TABS
650.0000 mg | ORAL_TABLET | Freq: Four times a day (QID) | ORAL | Status: DC | PRN
  Administered 2023-06-24: 650 mg via ORAL
  Filled 2023-06-22: qty 2

## 2023-06-22 MED ORDER — GUAIFENESIN 100 MG/5ML PO LIQD
10.0000 mL | ORAL | Status: DC
  Administered 2023-06-22 – 2023-06-26 (×20): 10 mL via ORAL
  Filled 2023-06-22 (×20): qty 10

## 2023-06-22 MED ORDER — LEVALBUTEROL HCL 0.63 MG/3ML IN NEBU
0.6300 mg | INHALATION_SOLUTION | Freq: Four times a day (QID) | RESPIRATORY_TRACT | Status: DC
  Administered 2023-06-22 – 2023-06-25 (×12): 0.63 mg via RESPIRATORY_TRACT
  Filled 2023-06-22 (×12): qty 3

## 2023-06-22 MED ORDER — LEVALBUTEROL HCL 0.63 MG/3ML IN NEBU
0.6300 mg | INHALATION_SOLUTION | Freq: Three times a day (TID) | RESPIRATORY_TRACT | Status: DC
  Administered 2023-06-22: 0.63 mg via RESPIRATORY_TRACT
  Filled 2023-06-22: qty 3

## 2023-06-22 MED ORDER — SODIUM CHLORIDE 3 % IN NEBU
4.0000 mL | INHALATION_SOLUTION | Freq: Two times a day (BID) | RESPIRATORY_TRACT | Status: DC
  Administered 2023-06-22 – 2023-06-28 (×13): 4 mL via RESPIRATORY_TRACT
  Filled 2023-06-22 (×15): qty 4

## 2023-06-22 NOTE — Progress Notes (Signed)
Subjective:  Alicia [name] is a 67 y.o. female with a PMHx significant for COPD, HFpEF, T2DM, CAD, HTN, GERD who was admitted for acute hypoxic respiratory failure likely multifactorial from acute on chronic HFpEF exacerbation and acute COPD exacerbation.   Pt was examined at bedside today. She reports she has no complaints to day and says her breathing has been stable from yesterday. She wants to sit up more in bed as this is more comfortable, and she is tired of lying down all day. She reports she is urinating well with the purewick; she denies dysuria. She does report she had pain in her bilateral temporal region this morning which intermittently occurred on both sides; this resolved by time pt was seen without use of pain medication.  Objective:  Vital signs in last 24 hours: Vitals:   06/22/23 0600 06/22/23 0828 06/22/23 0831 06/22/23 0840  BP:      Pulse: (!) 57 63    Resp: 16 16    Temp:      TempSrc:    Oral  SpO2: 98% 94% 93%   Weight:      Height:       Weight change: 1.7 kg  Intake/Output Summary (Last 24 hours) at 06/22/2023 1150 Last data filed at 06/22/2023 0742 Gross per 24 hour  Intake 1320 ml  Output 350 ml  Net 970 ml   Physical Exam General: Pt is lying back in hospital bed with head of bed updated. Sitting comfortably, no acute distress. Cardiovascular: RRR, no murmurs, rubs, gallops. Pulmonary: Normal work of breathing when at rest, increased work of breathing after minimal movement during PE. Also with cough. Poor air flow; lungs with wheezing bilaterally. Abdomen: Normal bowel sounds. MSK: No edema of bilateral lower extremities. Warm, dry. Neuro/Psych: Alert and oriented to person, place, time, event. Normal affect.     Latest Ref Rng & Units 06/22/2023    3:39 AM 06/21/2023    2:35 AM 06/20/2023    2:15 AM  CMP  Glucose 70 - 99 mg/dL 657  90  846   BUN 8 - 23 mg/dL 962  952  841   Creatinine 0.44 - 1.00 mg/dL 3.24  4.01  0.27   Sodium 135 - 145  mmol/L 131  132  131   Potassium 3.5 - 5.1 mmol/L 3.5  3.3  4.1   Chloride 98 - 111 mmol/L 86  83  83   CO2 22 - 32 mmol/L 28  36  34   Calcium 8.9 - 10.3 mg/dL 8.3  8.7  9.0   Mag 1.9  Phos 5.4     Latest Ref Rng & Units 06/22/2023    3:39 AM 06/21/2023    2:35 AM 06/20/2023    2:15 AM  CBC  WBC 4.0 - 10.5 K/uL 11.9  11.4  13.0   Hemoglobin 12.0 - 15.0 g/dL 25.3  66.4  40.3   Hematocrit 36.0 - 46.0 % 33.4  35.5  35.0   Platelets 150 - 400 K/uL 339  348  368    VBG 7/30: pH 7.6, pCO2 37, pO2 64  Assessment/Plan:  Principal Problem:   Acute on chronic hypoxic respiratory failure (HCC) Active Problems:   COPD (chronic obstructive pulmonary disease) (HCC)   Chronic respiratory failure with hypoxia, on home oxygen therapy (HCC)   AKI (acute kidney injury) (HCC)   Acute decompensated heart failure (HCC)   Acute on chronic heart failure with preserved ejection fraction (HCC)  Diastolic CHF, chronic (HCC)   Acute respiratory failure with hypoxia [facility])  Alicia [name] is a 67 y.o. female with a PMHx significant for COPD, HFpEF, T2DM, CAD, HTN, GERD who was admitted for acute hypoxic respiratory failure likely multifactorial from acute on chronic HFpEF exacerbation and acute COPD exacerbation.  Acute on Chronic hypoxic respiratory failure 2/2 COPD exacerbation, Acute on chronic HFpEF exacerbation Acute hypoxic respiratory failure secondary to HFpEF exacerbation and COPD exacerbation with suspected viral URI trigger. Echocardiogram done 06/16/23 showed LVEF 55-60%, elevated left end diastolic pressure, and IVC dilation suggestive of fluid overload. Pt weaned from BiPAP to HFNC over course of hospitalization. Currently on HFNC. Goal is to wean pt's O2 as much as possible; do not suspect she will be able to return to baseline levels at discharge. Will hold off on switching from HFNC to [place] today given pt's continue desaturations with minimal movement.  - Continue O2 supplementation with  HFNC - Continue to work with PT/OT  COPD Pt previously on baseline of 4-5 L O2; currently requiring much higher supplementation. Will continue pt on Pulmicort, Brovana; will schedule ipratropium and levalbuterol (increased PVCs on albuterol). Encouraged regular spirometry use. Will schedule guaifenesin q4h to help with cough. Will schedule hypertonic NS BID. - Continue treatment of COPD exacerbation with Pulmicort, Brovana; to discharge on triple therapy inhaler - START scheduled ipratropium and levalbuterol q8h; prn q6h - START scheduled guaifenesin q4h - START hypertonic NS nebulized BID  HFpEF For HFpEF, will continue pt on diuretic (torsemide); currently holding Entresto, spironolactone with no plan to restart at discharge. Can consider restarting [name] if renal function stabilizes (previously held last admission with UTI). Goal K>4 and Mag>2, will continue to monitor. Cardiology has signed off. - Continue torsemide 40 mg daily - Continue to monitor metabolic panels  Respiratory Alkalosis with concomitate Metabolic Alkalosis VBG done 7/31 with pH of 7.6 and pCO2 of 37. Metabolic panel with Bicarb of 28. Given alkalosis in setting of decreased CO2 with normal/minimally elevated bicarb, suspect this is a respiratory alkalosis picture. Would expect a decrease in bicarb of 2 mEq/L for every 10 mmHg decrease in PaCO2. With current VBG levels, would expect a bicarb around 22. Since pt's bicarb was higher than this, there is an additional metabolic alkalosis occurring; this is likely contraction alkalosis in setting of diuretic use. Suspect at baseline that pt has respiratory acidosis due to COPD with compensating metabolic alkalosis. Will continue to monitor with repeat VBG tomorrow. - VBG   AKI, non oliguric  Potential cardiorenal component of AKI given pt has experienced improved kidney function with diuresis with good urine output. Baseline Cr of 0.9. Pt had uptrending Cr and BUN, thought to  be due to overdiuresis and so switched from IV Lasix 120 mg TID to po torsemide 40 mg BID on 7/28, then from BID to torsemide 40 mg daily on 7/29. Cr and BUN stabilized following. Will continue to reassess pt's diuresis needs with Nephrology recommending reinstating aggressive IV diuretics if hypoxia worsens again. Nephrology has signed off.  Foley was removed 7/29 for voiding trial with purewick placed. Pt has done well on purewick with bladder scan done around 11am on 7/30 with 194 mL, following immediately after by urination. - Continue torsemide 40 mg daily - Continue to trend Cr   T2DM Home regimen includes Trulicity, Lantus and Humalog. Has been on Semglee, Novolog, SSI in hospital; initially required increasing insulin needs with elevated fasting glucoses, stabilized after steroids ended. Suspect steroids given  for COPD exacerbation contributing to hyperglycemia. Will continue to monitor CBGs and adjust insulin accordingly. - Continue Semglee 30 units daily - Continue Novolog 18 units TID - Continue SSI   Nonsustained asymptomatic Vtach Bradycardia with Junctional Rhythm Paroxysmal Atrial Fibrillation with RVR Hx of AV nodal dysfunction. Experienced episode of nonsustained Vtach 7/25, asymptomatic. Episode of A fib with RVR following switching pt back to BiPAP for low O2 sats; RVR improved with lopressor treatment. Etiology likely multifactorial as this patient is acutely ill and carries multiple risk factors for atrial fibrillation. Pt switched from Lovenox to Eliquis for stroke prophylaxis as she has a CHADVASC score of 6. Pt remains in sinus rhythm with occasional PVCs; will continue to monitor with tele. Cardiology has signed off. - Continue telemetry - Eliquis 5 mg BID for stroke prophylaxis - Lopressor 25 mg BID for rate control, no indication to increase dose given SBP occasionally in 100s; avoid other AV blocking agents  Chronic Conditions: Normocytic anemia: Stable. Hypertension:  Holding amlodipine 10 mg daily at nephro's rec. Holding Entresto, spironolactone; cardiology does not recommend restarting these medications. Hyperlipidemia, CAD: Continue atorvastatin 80 mg daily, Zetia 10 mg daily, ASA 81 mg daily   Code Status: Full Diet: Renal/carb modified with fluid restriction ( ) IV Fluid: None DVT Prophylaxis: Eliquis 5 mg BID  Dispo: Still continuing medical management with goal of reducing O2 needs. Considering options for discharge to home with home health PT/OT vs discharge to LTAC (currently exploring this option); pt preference is home with home health, though she would need to be on max 10 L O2 for this to happen.   LOS: 7 days   Governor Rooks, Medical Student  06/22/2023, 11:50 AM

## 2023-06-22 NOTE — Plan of Care (Signed)

## 2023-06-22 NOTE — Progress Notes (Signed)
   Patient Name: Alicia [name] Date of Encounter: 06/22/2023 [facility] Cardiologist: Donato Schultz, MD   Interval Summary  .   67 yo female admitted 07/23 w/ acute on chronic HFpEF, PAF.  Better still on oxygen sats 92%   Vital Signs .    Vitals:   06/22/23 0600 06/22/23 0828 06/22/23 0831 06/22/23 0840  BP:      Pulse: (!) 57 63    Resp: 16 16    Temp:      TempSrc:    Oral  SpO2: 98% 94% 93%   Weight:      Height:        Intake/Output Summary (Last 24 hours) at 06/22/2023 1031 Last data filed at 06/22/2023 0742 Gross per 24 hour  Intake 1320 ml  Output 350 ml  Net 970 ml      06/22/2023    5:34 AM 06/21/2023    5:16 AM 06/20/2023    4:01 AM  Last 3 Weights  Weight (lbs) 211 lb 10.3 oz 207 lb 14.3 oz 212 lb 8.4 oz  Weight (kg) 96 kg 94.3 kg 96.4 kg      Telemetry/ECG    No Afib in > 24 hr, SR w/ PVCs, less frequent than yesterday - Personally Reviewed  Physical Exam .    General: chronically ill obese black female  Head: ? Infection right eye per patient  Lungs: decreased BS bases Heart: HRRR S1 S2, without rub or gallop. No murmur. 4/4 extremity pulses are 2+ & equal. No JVD. Abdomen: Bowel sounds are present, abdomen soft and non-tender without masses or  hernias noted. Msk: Normal strength and tone for age. Extremities: No clubbing, cyanosis or edema.    Skin:  No rashes or lesions noted. Neuro: Alert and oriented X 3. Psych:  Good affect, responds appropriately      Latest Ref Rng & Units 06/22/2023    3:39 AM 06/21/2023    2:35 AM 06/20/2023    2:15 AM  BMP  Glucose 70 - 99 mg/dL 829  90  562   BUN 8 - 23 mg/dL 130  865  784   Creatinine 0.44 - 1.00 mg/dL 6.96  2.95  2.84   Sodium 135 - 145 mmol/L 131  132  131   Potassium 3.5 - 5.1 mmol/L 3.5  3.3  4.1   Chloride 98 - 111 mmol/L 86  83  83   CO2 22 - 32 mmol/L 28  36  34   Calcium 8.9 - 10.3 mg/dL 8.3  8.7  9.0      Assessment & Plan .     Acute diastolic heart failure - got IV  Lasix 60-80 mg thru 07/24, then Lasix 120 mg tid with metolazone 5 mg bid thru 07/27, got torsemide 40 mg 07/28 - diuretics d/c'd 2nd Cr 1.93, above her baseline stable now  - Nephrology signed off  - No peripheral edema  - would not restart entresto or aldactone   Paroxysmal atrial fibrillation - Off-and-on episodes, but none in > 24 hr - continue metoprolol 25 twice daily - continue Eliquis 5 mg bid  - no dose increase on BB since SBP 100s at times - maintaining sinus rhythm (w/ PVCs)  Cardiology will sign off  Will arrange outpatient f/u Dr Anne Fu   For questions or updates, please contact [facility] Please consult [email or web address] for contact info under        Signed, Charlton Haws, MD

## 2023-06-22 NOTE — Progress Notes (Signed)
Occupational Therapy Treatment Patient Details Name: Alicia [name] MRN: [id number] DOB: 1956-06-04 Today's Date: 06/22/2023   History of present illness 67 y.o. female presents to [facility] hospital on 06/15/2023 with SOB, cough and ear pain. Pt required BiPAP in the ED due to hypoxia. Pt admitted for CHF and COPD exacerbation 2/2 URI. 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   OT comments  Patient on HHFNC and received in supine and SpO2 91%. Patient able to get to EOB with supervision and patient desat to 78-80% and returned to 92% with cues for PLB. Patient donned underwear while seated/standing from EOB with supervision and O2 dropping to 81% and returned to 90's with seated rest and pursed lip breathing. Patient performed static standing with supervision and tolerating 45 seconds before requiring seated rest break. Patient able to perform transfer to recliner with rollator and supervision. At end of session patient SpO2 87%, HR 64, and BP 105/58 seated in recliner. Patient making good progress but is limited due to O2 saturation and activity tolerance. Acute OT to continue to follow.    Recommendations for follow up therapy are one component of a multi-disciplinary discharge planning process, led by the attending physician.  Recommendations may be updated based on patient status, additional functional criteria and insurance authorization.    Assistance Recommended at Discharge Frequent or constant Supervision/Assistance  Patient can return home with the following  A little help with walking and/or transfers;A lot of help with bathing/dressing/bathroom;Assistance with cooking/housework;Assist for transportation;Help with stairs or ramp for entrance   Equipment Recommendations  Tub/shower bench;BSC/3in1    Recommendations for Other Services      Precautions / Restrictions Precautions Precautions: Fall Precaution  Comments: monitor HR and sats, 35 L/min and 55% FiO2 Restrictions Weight Bearing Restrictions: No       Mobility Bed Mobility Overal bed mobility: Needs Assistance Bed Mobility: Supine to Sit     Supine to sit: Supervision, HOB elevated     General bed mobility comments: supervision and increased time    Transfers Overall transfer level: Needs assistance Equipment used: Rollator (4 wheels) Transfers: Sit to/from Stand, Bed to chair/wheelchair/BSC Sit to Stand: Supervision     Step pivot transfers: Supervision     General transfer comment: cues for safety with rollator and supervision     Balance Overall balance assessment: Needs assistance Sitting-balance support: Bilateral upper extremity supported Sitting balance-Leahy Scale: Good     Standing balance support: Bilateral upper extremity supported, No upper extremity supported, Reliant on assistive device for balance Standing balance-Leahy Scale: Poor Standing balance comment: able to stand with no UE support to pull up clothing but required seated break following. static standing from EOB with patient tolerating 45 seconds                           ADL either performed or assessed with clinical judgement   ADL Overall ADL's : Needs assistance/impaired     Grooming: Set up;Sitting               Lower Body Dressing: Supervision/safety;Sit to/from stand Lower Body Dressing Details (indicate cue type and reason): able to thread legs into clothing and pull up while standing Toilet Transfer: Rollator (4 wheels);Supervision/safety Toilet Transfer Details (indicate cue type and reason): simulated to recliner           General ADL Comments: requires  seated breaks between tasks and cues for PLB    Extremity/Trunk Assessment              Vision       Perception     Praxis      Cognition Arousal/Alertness: Awake/alert Behavior During Therapy: WFL for tasks assessed/performed Overall  Cognitive Status: Within Functional Limits for tasks assessed                                          Exercises      Shoulder Instructions       General Comments HHFNC 35 liters 55% SpO2 87%, HR 64, BP 105/58 at end of session    Pertinent Vitals/ Pain       Pain Assessment Pain Assessment: Faces Faces Pain Scale: No hurt Pain Intervention(s): Monitored during session  Home Living                                          Prior Functioning/Environment              Frequency  Min 1X/week        Progress Toward Goals  OT Goals(current goals can now be found in the care plan section)  Progress towards OT goals: Progressing toward goals  Acute Rehab OT Goals Patient Stated Goal: go home OT Goal Formulation: With patient Time For Goal Achievement: 07/01/23 Potential to Achieve Goals: Good ADL Goals Pt Will Perform Upper Body Dressing: with supervision;sitting Pt Will Perform Lower Body Dressing: with supervision;with adaptive equipment;sitting/lateral leans;sit to/from stand Pt Will Transfer to Toilet: with supervision;ambulating;regular height toilet Additional ADL Goal #1: pt will be able to complete 2 standing functional tasks with SpO2 at or above 95% in order to improve activity tolerance for ADLs Additional ADL Goal #2: pt will verbalize x3 energy conservation strategies in prep for ADLs  Plan Discharge plan needs to be updated;Frequency remains appropriate    Co-evaluation                 AM-PAC OT "6 Clicks" Daily Activity     Outcome Measure   Help from another person eating meals?: None Help from another person taking care of personal grooming?: A Little Help from another person toileting, which includes using toliet, bedpan, or urinal?: A Little Help from another person bathing (including washing, rinsing, drying)?: A Lot Help from another person to put on and taking off regular upper body clothing?: A  Little Help from another person to put on and taking off regular lower body clothing?: A Little 6 Click Score: 18    End of Session Equipment Utilized During Treatment: Oxygen;Rollator (4 wheels) (35 liters 55%)  OT Visit Diagnosis: Unsteadiness on feet (R26.81);Other abnormalities of gait and mobility (R26.89);Muscle weakness (generalized) (M62.81)   Activity Tolerance Patient tolerated treatment well   Patient Left in chair;with call bell/phone within reach;with chair alarm set   Nurse Communication Mobility status;Other (comment) (O2 sats)        Time: 4696-2952 OT Time Calculation (min): 24 min  Charges: OT General Charges $OT Visit: 1 Visit OT Treatments $Self Care/Home Management : 8-22 mins $Therapeutic Activity: 8-22 mins  Alfonse Flavors, OTA Acute Rehabilitation Services  Office [phone]   Alicia [name] 06/22/2023, 1:17 PM

## 2023-06-22 NOTE — TOC Progression Note (Addendum)
Transition of Care [facility]) - Progression Note    Patient Details  Name: Alicia [name] MRN: [id number] Date of Birth: [birth year]  [facility]) CM/SW Contact  Leone Haven, RN Phone Number: 06/22/2023, 11:20 AM  Clinical Narrative:    Patient is set up with [facility] services with Frances Furbish, she states her daughter , Marti Sleigh, will be in and out checking on her as well and she will be fine with just HH at home per patient. Physical therapy will work with her again today. Patient is on 35 liters HFNC right now, will see if she could be LTACH candidate.  1527- she is a candidate for both [facility], NCM spoke with patient about LTACH and she states she prefers to go to the 5th floor to [facility].  NCM informed Cassell Clement and Glee Arvin of this information. Glee Arvin states she will start auth for LTACH.    Expected Discharge Plan: Home w Home Health Services Barriers to Discharge: Continued Medical Work up  Expected Discharge Plan and Services In-house Referral: NA Discharge Planning Services: CM Consult Post Acute Care Choice: Home Health, Durable Medical Equipment Living arrangements for the past 2 months: Single Family Home                 DME Arranged: Walker rolling with seat DME Agency: [employer] Date DME Agency Contacted: 06/18/23 Time DME Agency Contacted: [phone] Representative spoke with at DME Agency: Vaughan Basta HH Arranged: RN, PT, OT HH Agency: [facility] Date [facility] Agency Contacted: 06/18/23 Time HH Agency Contacted: 1619 Representative spoke with at [facility] Agency: Kandee Keen   Social Determinants of Health (SDOH) Interventions SDOH Screenings   Food Insecurity: No Food Insecurity (06/18/2023)  Housing: Low Risk  (06/18/2023)  Transportation Needs: No Transportation Needs (06/18/2023)  Utilities: Not At Risk (06/18/2023)  Alcohol Screen: Low Risk  (06/18/2023)  Depression (PHQ2-9): Low Risk  (05/25/2023)  Financial Resource Strain: Low Risk   (06/18/2023)  Physical Activity: Inactive (05/19/2023)  Social Connections: Socially Isolated (05/19/2023)  Stress: No Stress Concern Present (05/19/2023)  Tobacco Use: Medium Risk (06/15/2023)    Readmission Risk Interventions     No data to display

## 2023-06-22 NOTE — Progress Notes (Signed)
   Heart Failure Stewardship Pharmacist Progress Note   PCP: [name], MD PCP-Cardiologist: Donato Schultz, MD    HPI:  67 yo F with PMH of COPD, oxygen dependent, CHF (EF 20-25% in 2015, recovered to 55-60%), CAD, HLD, GERD, T2DM, and CVA.    Admitted in Nov 2023 with acute on chronic CHF exacerbation. Diuresed w/ IV Lasix and treated w/ abx, steroids and nebs. Echo showed normal EF 55-60% and normal RV. After diureses, was transitioned to PO Lasix. Also placed on Entresto.   She was seen in HF TOC 10/2022. Jardiance increased to 10 mg daily and spironlactone added. She was seen back in [facility] 11/2022 and spironolactone was increased to 25 mg daily. She was later seen at [facility] on 1/30 and lasix was decreased to 80 mg AM and 40 mg PM.  Admitted 01/2023 with acute on chronic CHF exacerbation. ECHO 3/17 showed LVEF 60-65%. Diuresed with IV lasix 120 mg, metolazone, and diamox. Discharged on Eentresto, spironolactone, amlodipine, and lasix. [name] held due to UTI.   Presented to the ED on 7/23 with increasing O2 needs and desaturations. CXR showed pulmonary congestion. BNP elevated. ECHO 7/24 showed LVEF 50-60%, G3DD, no RWMA, RV normal, moderately elevated PA pressure.   Current HF Medications: Diuretic: torsemide 40 mg daily Beta Blocker: metoprolol tartrate 25 mg BID  Prior to admission HF Medications: Diuretic: torsemide 60 mg daily ACE/ARB/ARNI: Entresto 97/103 mg BID MRA: spironolactone 25 mg daily  Pertinent Lab Values: Serum creatinine 1.99, BUN 122, Potassium 3.5, Sodium 131, BNP 3750.1, Magnesium 1.9, A1c 8.2   Vital Signs: Weight: 211 lbs (admission weight: 221 lbs) Blood pressure: 110/60s  Heart rate: 60-70s  I/O: net +0.7L yesterday; net -10.1L since admission  Medication Assistance / Insurance Benefits Check: Does the patient have prescription insurance?  Yes Type of insurance plan: [employer]  Outpatient Pharmacy:  Prior to admission [facility]  [facility] Is the patient willing to use [facility] TOC pharmacy at discharge? Yes Is the patient willing to transition their outpatient pharmacy to utilize a [facility] outpatient pharmacy?   Pending    Assessment: 1. 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 NYHA class III symptoms. - Continue torsemide 40 mg daily. Creatinine relatively stable from yesterday. Strict I/Os and daily weights. Keep K>4 and Mg>2. - Continue metoprolol tartrate 25 mg BID. History of bradycardia. Rates have been ok. Continue to monitor.  - Holding Entresto and spironolactone with AKI - Held Farxiga last admission with UTI - consider restarting this admission if renal function stabillizes  Plan: 1) Medication changes recommended at this time: - Continue current regimen   2) Patient assistance: - None pending, has [place] Medicaid   3)  Education  - Patient has been educated on current HF medications and potential additions to HF medication regimen - Patient verbalizes understanding that over the next few months, these medication doses may change and more medications may be added to optimize HF regimen - Patient has been educated on basic disease state pathophysiology and goals of therapy   Sharen Hones, PharmD, BCPS Heart Failure Stewardship Pharmacist Phone [phone]

## 2023-06-23 ENCOUNTER — Inpatient Hospital Stay (HOSPITAL_COMMUNITY): Payer: 59

## 2023-06-23 DIAGNOSIS — J441 Chronic obstructive pulmonary disease with (acute) exacerbation: Secondary | ICD-10-CM | POA: Diagnosis not present

## 2023-06-23 DIAGNOSIS — I5033 Acute on chronic diastolic (congestive) heart failure: Secondary | ICD-10-CM | POA: Diagnosis not present

## 2023-06-23 DIAGNOSIS — J9621 Acute and chronic respiratory failure with hypoxia: Secondary | ICD-10-CM | POA: Diagnosis not present

## 2023-06-23 DIAGNOSIS — N179 Acute kidney failure, unspecified: Secondary | ICD-10-CM | POA: Diagnosis not present

## 2023-06-23 DIAGNOSIS — E873 Alkalosis: Secondary | ICD-10-CM

## 2023-06-23 LAB — GLUCOSE, CAPILLARY
Glucose-Capillary: 168 mg/dL — ABNORMAL HIGH (ref 70–99)
Glucose-Capillary: 158 mg/dL — ABNORMAL HIGH (ref 70–99)
Glucose-Capillary: 140 mg/dL — ABNORMAL HIGH (ref 70–99)
Glucose-Capillary: 112 mg/dL — ABNORMAL HIGH (ref 70–99)

## 2023-06-23 LAB — BASIC METABOLIC PANEL
Anion gap: 12 (ref 5–15)
BUN: 113 mg/dL — ABNORMAL HIGH (ref 8–23)
CO2: 31 mmol/L (ref 22–32)
Calcium: 8.4 mg/dL — ABNORMAL LOW (ref 8.9–10.3)
Chloride: 86 mmol/L — ABNORMAL LOW (ref 98–111)
Creatinine, Ser: 1.49 mg/dL — ABNORMAL HIGH (ref 0.44–1.00)
GFR, Estimated: 39 mL/min — ABNORMAL LOW (ref >60)
Glucose, Bld: 155 mg/dL — ABNORMAL HIGH (ref 70–99)
Potassium: 3.8 mmol/L (ref 3.5–5.1)
Sodium: 129 mmol/L — ABNORMAL LOW (ref 135–145)

## 2023-06-23 LAB — CBC
HCT: 31.3 % — ABNORMAL LOW (ref 36.0–46.0)
Hemoglobin: 10.2 g/dL — ABNORMAL LOW (ref 12.0–15.0)
MCH: 26.7 pg (ref 26.0–34.0)
MCHC: 32.6 g/dL (ref 30.0–36.0)
MCV: 81.9 fL (ref 80.0–100.0)
Platelets: 323 10*3/uL (ref 150–400)
RBC: 3.82 MIL/uL — ABNORMAL LOW (ref 3.87–5.11)
RDW: 15.3 % (ref 11.5–15.5)
WBC: 12.4 10*3/uL — ABNORMAL HIGH (ref 4.0–10.5)
nRBC: 0 % (ref 0.0–0.2)

## 2023-06-23 MED ORDER — TORSEMIDE 20 MG PO TABS
40.0000 mg | ORAL_TABLET | Freq: Every day | ORAL | Status: DC
  Administered 2023-06-23 – 2023-06-28 (×6): 40 mg via ORAL
  Filled 2023-06-23 (×6): qty 2

## 2023-06-23 MED ORDER — METOPROLOL TARTRATE 12.5 MG HALF TABLET
12.5000 mg | ORAL_TABLET | Freq: Two times a day (BID) | ORAL | Status: DC
  Filled 2023-06-23: qty 1

## 2023-06-23 MED ORDER — MAGNESIUM SULFATE 2 GM/50ML IV SOLN
2.0000 g | Freq: Once | INTRAVENOUS | Status: AC
  Administered 2023-06-23: 2 g via INTRAVENOUS
  Filled 2023-06-23: qty 50

## 2023-06-23 MED ORDER — POTASSIUM CHLORIDE CRYS ER 20 MEQ PO TBCR
40.0000 meq | EXTENDED_RELEASE_TABLET | Freq: Two times a day (BID) | ORAL | Status: AC
  Administered 2023-06-23 (×2): 40 meq via ORAL
  Filled 2023-06-23 (×2): qty 2

## 2023-06-23 MED ORDER — POTASSIUM CHLORIDE 10 MEQ/100ML IV SOLN
10.0000 meq | INTRAVENOUS | Status: AC
  Administered 2023-06-23 (×4): 10 meq via INTRAVENOUS
  Filled 2023-06-23 (×4): qty 100

## 2023-06-23 NOTE — Plan of Care (Signed)
  Problem: Education: Goal: Ability to describe self-care measures that may prevent or decrease complications (Diabetes Survival Skills Education) will improve Outcome: Progressing   Problem: Coping: Goal: Ability to adjust to condition or change in health will improve Outcome: Progressing   Problem: Fluid Volume: Goal: Ability to maintain a balanced intake and output will improve Outcome: Progressing   

## 2023-06-23 NOTE — Progress Notes (Signed)
   06/22/23 2256  BiPAP/CPAP/SIPAP  $ Non-Invasive Ventilator  Non-Invasive Vent Subsequent  BiPAP/CPAP/SIPAP Pt Type Adult  BiPAP/CPAP/SIPAP V60  Mask Type Full face mask  Mask Size Medium  Set Rate 8 breaths/min  Respiratory Rate 36 breaths/min  IPAP 12 cmH20  EPAP 6 cmH2O  FiO2 (%) 55 %  Minute Ventilation 10.7  Leak 3  Peak Inspiratory Pressure (PIP) 14  Tidal Volume (Vt) 302  Patient Home Equipment No  Press High Alarm 30 cmH2O  Press Low Alarm 5 cmH2O  CPAP/SIPAP surface wiped down Yes  BiPAP/CPAP /SiPAP Vitals  Pulse Rate 73  Resp 20  SpO2 95 %  MEWS Score/Color  MEWS Score 0  MEWS Score Color Alicia [name]

## 2023-06-23 NOTE — Plan of Care (Signed)
  Problem: Education: Goal: Knowledge of General Education information will improve Description Including pain rating scale, medication(s)/side effects and non-pharmacologic comfort measures Outcome: Progressing   

## 2023-06-23 NOTE — Progress Notes (Signed)
Physical Therapy Treatment Patient Details Name: Alicia [name] MRN: [id number] DOB: 18-Aug-1956 Today's Date: 06/23/2023   History of Present Illness 67 y.o. female presents to [facility] hospital on 06/15/2023 with SOB, cough and ear pain. Pt required BiPAP in the ED due to hypoxia. Pt admitted for CHF and COPD exacerbation 2/2 URI. 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    PT Comments  Pt requiring a little more assist for bed mobility and transfers today. Pt incontinent of stool and purewick leaking so had pt roll to clean up prior to OOB. Agree with plan for LTACH.     If plan is discharge home, recommend the following: Assist for transportation;Help with stairs or ramp for entrance;A little help with walking and/or transfers;A little help with bathing/dressing/bathroom;A lot of help with walking and/or transfers;Assistance with cooking/housework   Can travel by private vehicle     No  Equipment Recommendations  None recommended by PT    Recommendations for Other Services       Precautions / Restrictions Precautions Precautions: Fall Precaution Comments: monitor HR and sats, 35 L/min and 60% FiO2 Restrictions Weight Bearing Restrictions: No     Mobility  Bed Mobility Overal bed mobility: Needs Assistance Bed Mobility: Rolling, Sidelying to Sit Rolling: Min guard Sidelying to sit: Min assist       General bed mobility comments: Assist to elevate trunk into sitting    Transfers Overall transfer level: Needs assistance Equipment used: Rollator (4 wheels), 1 person hand held assist Transfers: Sit to/from Stand, Bed to chair/wheelchair/BSC Sit to Stand: Min assist   Step pivot transfers: Min assist       General transfer comment: Assist to power up and for balance. Stood from bed with hand held for transfer to bsc. Used rollator for [facility] to recliner    Ambulation/Gait                    Stairs             Wheelchair Mobility     Tilt Bed    Modified Rankin (Stroke Patients Only)       Balance Overall balance assessment: Needs assistance Sitting-balance support: Bilateral upper extremity supported Sitting balance-Leahy Scale: Good     Standing balance support: Bilateral upper extremity supported, No upper extremity supported, Reliant on assistive device for balance Standing balance-Leahy Scale: Poor Standing balance comment: UE support                            Cognition Arousal/Alertness: Awake/alert Behavior During Therapy: WFL for tasks assessed/performed Overall Cognitive Status: Within Functional Limits for tasks assessed                                          Exercises      General Comments General comments (skin integrity, edema, etc.): HHFNC 35L 60% FiO2. Pt with SpO2 84-91% throughout treatment. Frequent rest breaks.      Pertinent Vitals/Pain Pain Assessment Pain Assessment: No/denies pain    Home Living                          Prior Function            PT  Goals (current goals can now be found in the care plan section) Progress towards PT goals: Not progressing toward goals - comment    Frequency    Min 1X/week      PT Plan Discharge plan needs to be updated    Co-evaluation              AM-PAC PT "6 Clicks" Mobility   Outcome Measure  Help needed turning from your back to your side while in a flat bed without using bedrails?: A Little Help needed moving from lying on your back to sitting on the side of a flat bed without using bedrails?: A Little Help needed moving to and from a bed to a chair (including a wheelchair)?: A Little Help needed standing up from a chair using your arms (e.g., wheelchair or bedside chair)?: A Little Help needed to walk in hospital room?: Total Help needed climbing 3-5 steps with a railing? : Total 6 Click Score: 14    End of  Session Equipment Utilized During Treatment: Oxygen Activity Tolerance: Patient limited by fatigue Patient left: in chair;with call bell/phone within reach;with chair alarm set Nurse Communication: Mobility status PT Visit Diagnosis: Muscle weakness (generalized) (M62.81);Difficulty in walking, not elsewhere classified (R26.2)     Time: 1610-9604 PT Time Calculation (min) (ACUTE ONLY): 33 min  Charges:    $Therapeutic Activity: 8-22 mins PT General Charges $$ ACUTE PT VISIT: 1 Visit                     [facility] PT Acute Rehabilitation Services Office [phone]    Angelina Ok [facility] 06/23/2023, 11:20 AM

## 2023-06-23 NOTE — Progress Notes (Signed)
   Heart Failure Stewardship Pharmacist Progress Note   PCP: [name], MD PCP-Cardiologist: Donato Schultz, MD    HPI:  67 yo F with PMH of COPD, oxygen dependent, CHF (EF 20-25% in 2015, recovered to 55-60%), CAD, HLD, GERD, T2DM, and CVA.    Admitted in Nov 2023 with acute on chronic CHF exacerbation. Diuresed w/ IV Lasix and treated w/ abx, steroids and nebs. Echo showed normal EF 55-60% and normal RV. After diureses, was transitioned to PO Lasix. Also placed on Entresto.   She was seen in HF TOC 10/2022. Jardiance increased to 10 mg daily and spironlactone added. She was seen back in [facility] 11/2022 and spironolactone was increased to 25 mg daily. She was later seen at [facility] on 1/30 and lasix was decreased to 80 mg AM and 40 mg PM.  Admitted 01/2023 with acute on chronic CHF exacerbation. ECHO 3/17 showed LVEF 60-65%. Diuresed with IV lasix 120 mg, metolazone, and diamox. Discharged on Eentresto, spironolactone, amlodipine, and lasix. [name] held due to UTI.   Presented to the ED on 7/23 with increasing O2 needs and desaturations. CXR showed pulmonary congestion. BNP elevated. ECHO 7/24 showed LVEF 50-60%, G3DD, no RWMA, RV normal, moderately elevated PA pressure.   Current HF Medications: None  Prior to admission HF Medications: Diuretic: torsemide 60 mg daily ACE/ARB/ARNI: Entresto 97/103 mg BID MRA: spironolactone 25 mg daily  Pertinent Lab Values: Serum creatinine 1.52, BUN 122, Potassium 2.8, Sodium 129, BNP 3750.1, Magnesium 1.8, A1c 8.2   Vital Signs: Weight: 213 lbs (admission weight: 221 lbs) Blood pressure: 110/50s  Heart rate: 50s  I/O: net -11L since admission  Medication Assistance / Insurance Benefits Check: Does the patient have prescription insurance?  Yes Type of insurance plan: [place] Medicaid  Outpatient Pharmacy:  Prior to admission [facility] Is the patient willing to use [facility] TOC pharmacy at discharge? Yes Is the patient willing to  transition their outpatient pharmacy to utilize a [facility] outpatient pharmacy?   Pending    Assessment: 1. 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 NYHA class III symptoms. - Holding torsemide 40 mg daily with severe hypokalemia and hyponatremia. K being repleted. Creatinine improved from yesterday. Strict I/Os and daily weights. Keep K>4 and Mg>2. - Holding metoprolol. History of bradycardia. Rates have dropped again. - Holding Entresto and spironolactone with AKI - Held Farxiga last admission with UTI - consider restarting this admission if renal function stabillizes  Plan: 1) Medication changes recommended at this time: - Agree with changes  2) Patient assistance: - None pending, has  Medicaid   3)  Education  - Patient has been educated on current HF medications and potential additions to HF medication regimen - Patient verbalizes understanding that over the next few months, these medication doses may change and more medications may be added to optimize HF regimen - Patient has been educated on basic disease state pathophysiology and goals of therapy   Sharen Hones, PharmD, BCPS Heart Failure Stewardship Pharmacist Phone [phone]

## 2023-06-23 NOTE — Progress Notes (Signed)
Subjective:  Alicia [name] is a 67 y.o. female with a PMHx significant for COPD, HFpEF, T2DM, CAD, HTN, GERD who was admitted for acute hypoxic respiratory failure likely multifactorial from acute on chronic HFpEF exacerbation and acute COPD exacerbation. Pt remains hospitalized for continued respiratory therapy with O2 supplementation.  ON Events significant for switch from HFNC to BiPAP for pt-reported shortness of breath; pt was seen by respiratory therapy this am, who switched her back to HFNC. She was on HFNC when she was examined today.  Pt was examined at bedside today. She reports she feels good and "energized." She reports she had trouble breathing last night, requiring BiPAP; she is not sure what prompted this trouble breathing. She reports she is breathing well currently on HFNC. She reports her cough has improved. No other pt-reported concerns. She was intermittently desatting during exam, down to low 80s at one point with recovery to low-mid 90s after rest and deep breathing.  Did have a goals of care conversation with pt about her continual high O2 needs, and how her lungs are not improving as we had initially hoped. Pt is aware of transfer to [name] at discharge and is amenable to this. Patient acknowledges that she is ok for a longer stay in the [facility] with HFNC.    Objective:  Vital signs in last 24 hours: Vitals:   06/23/23 0758 06/23/23 0800 06/23/23 0900 06/23/23 0908  BP:      Pulse: (!) 55 (!) 54 (!) 56 (!) 59  Resp:      Temp:      TempSrc:      SpO2:      Weight:      Height:       Weight change: 1 kg  Intake/Output Summary (Last 24 hours) at 06/23/2023 1300 Last data filed at 06/23/2023 0834 Gross per 24 hour  Intake 420 ml  Output 1100 ml  Net -680 ml   Physical Exam General: Pt is laying back in bed with head of bed partially elevated. Appears comfortable, in no acute distress. Cardiovascular: RRR, no murmurs, rubs, gallops. Pulmonary: Pt on HFNC.  Normal work of breathing. Lungs without wheezing or crackles but with poor air movement, especially in bilateral bases. Abdomen: Normal bowel sounds. Soft and nondistended in all four quadrants. No tenderness to palpation. MSK: No edema of bilateral lower extremities. Warm, dry. Neuro/Psych: Alert and oriented to person, place, event. (Time not formerly assessed.) Normal affect, becomes appropriately intermittently teary while discussing goals of care.     Latest Ref Rng & Units 06/23/2023    1:31 AM 06/22/2023    3:39 AM 06/21/2023    2:35 AM  CMP  Glucose 70 - 99 mg/dL 161  096  90   BUN 8 - 23 mg/dL 045  409  811   Creatinine 0.44 - 1.00 mg/dL 9.14  7.82  9.56   Sodium 135 - 145 mmol/L 129  131  132   Potassium 3.5 - 5.1 mmol/L 2.8  3.5  3.3   Chloride 98 - 111 mmol/L 84  86  83   CO2 22 - 32 mmol/L 31  28  36   Calcium 8.9 - 10.3 mg/dL 8.2  8.3  8.7   Phos 5.3  Mag 1.8     Latest Ref Rng & Units 06/23/2023   10:04 AM 06/22/2023    3:39 AM 06/21/2023    2:35 AM  CBC  WBC 4.0 - 10.5 K/uL 12.4  11.9  11.4   Hemoglobin 12.0 - 15.0 g/dL 95.6  21.3  08.6   Hematocrit 36.0 - 46.0 % 31.3  33.4  35.5   Platelets 150 - 400 K/uL 323  339  348    VBG 7/31: pH 7.45, pCO2 47, pO2 49  CXR 7/31: My impression is cardiomegaly, Blunting of R costovertebral angle. No obvious consolidation.  Assessment/Plan:  Principal Problem:   Acute on chronic hypoxic respiratory failure (HCC) Active Problems:   COPD (chronic obstructive pulmonary disease) (HCC)   Chronic respiratory failure with hypoxia, on home oxygen therapy (HCC)   AKI (acute kidney injury) (HCC)   Acute decompensated heart failure (HCC)   Acute on chronic heart failure with preserved ejection fraction (HCC)   Diastolic CHF, chronic (HCC)   Acute respiratory failure with hypoxia Alicia [name])  Alicia [name] is a 67 y.o. female with a PMHx significant for COPD, HFpEF, T2DM, CAD, HTN, GERD who was admitted for acute hypoxic respiratory  failure likely multifactorial from acute on chronic HFpEF exacerbation and acute COPD exacerbation.   Acute on Chronic hypoxic respiratory failure 2/2 COPD exacerbation, Acute on chronic HFpEF exacerbation Acute hypoxic respiratory failure secondary to HFpEF exacerbation and COPD exacerbation with suspected viral URI trigger. Echocardiogram done 06/16/23 showed LVEF 55-60%, elevated left end diastolic pressure, and IVC dilation suggestive of fluid overload. Pt weaned from BiPAP to HFNC over course of hospitalization with intermittent BiPAP requirements following. Currently on HFNC. Given pt's worsening breathing last night (7/30) with need for overnight BiPAP, arranged for CXR and CBC to evaluate for infectious source of worsening breathing. Repeat CXR with signs of fluid, especially on R side; will see what afternoon K is and, pending improvement, will restart pt on torsemide to diurese; no signs of PNA on CXR.   Goal is to wean pt's O2 as much as possible; do not suspect she will be able to return to baseline levels at discharge. Pt will discharge to [facility] for continued HFNC O2 support and continued work with PT/OT.  - Continue O2 supplementation with HFNC - Continue to work with PT/OT - HOLD torsemide 40 mg daily; restart this afternoon pending K level  Leukocytosis WBC initially elevated up to 13.5 at admission. Was initially improving, now increased to 12.4 on 7/31. CXR done 7/31 without focal consolidation which would be suspicious for PNA. Pt also without fever. No indication for abx at this time; will continue to monitor WBC. - Repeat CBC   HFpEF Pt on torsemide 40 mg daily for diuresis; currently holding Entresto, spironolactone with no plan to restart at discharge. Can consider restarting [name] if renal function stabilizes (previously held last admission with UTI). Goal K>4 and Mag>2; will replete. Will hold torsemide this am given very low K of 2.8. Continue to monitor electrolytes.  Pt  to follow-up with cardiology outpatient; appointment scheduled on 07/08/2023 at 8:25am at [facility]. - Replete K with po K Cl 40 mEq BID; IV K 40 mEq - Replete Mag with IV 2 g - Regular metabolic panels; one to be done this afternoon - HOLD torsemide 40 mg daily; consider restarting this afternoon pending repeat K  Nonsustained asymptomatic Vtach Bradycardia with Junctional Rhythm Paroxysmal Atrial Fibrillation with RVR Hx of AV nodal dysfunction. Episode of asymptomatic, nonsustained Vtach 7/25. New onset A fib with RVR this hospitalization, improved with lopressor, pt started on Eliquis for CHADSVASC of 6; etiology of A fib multifactorial. Pt with HRs in the 40s/50s; will decrease lopressor.  Pt to follow-up with cardiology outpatient; appointment scheduled on 07/08/2023 at 8:25am at [name]. - Continue telemetry - Eliquis 5 mg BID for stroke prophylaxis - REDUCE lopressor from 25 to 12.5 mg BID for rate control; avoid other AV blocking agents  COPD Pt previously on baseline of 4-5 L O2; currently requiring much higher supplementation. Will continue pt on Pulmicort, Brovana, ipratropium, levalbuterol (increased PVCs on albuterol). Scheduled guaifenesin q4h, nebulized hypertonic NS BID to improve cough. Encouraged regular spirometry use. - Pulmicort, Brovana, ipratropium q8h, levalbuterol q8h; to discharge on triple therapy inhaler - Levalbuterol prn q6h, ipratropium prn q6h - Guaifenesin q4h, nebulized hypertonic NS BID  AKI, non oliguric; improving Potential cardiorenal component of AKI given pt has experienced improved kidney function with diuresis with good urine output. Baseline Cr of 0.9. Elevated Cr and BUN thought to be due to overdiuresis, pt switched from IV Lasix to po torsemide at reduced dose with improvement in Cr and BUN. Pt currently on purewick, doing well.  - Continue to trend Cr   T2DM Home regimen includes  Trulicity, Lantus and Humalog. Has been on Semglee, Novolog, SSI in [name]; initially required increasing insulin needs with elevated fasting glucoses, stabilized after steroids ended. Suspect steroids given for COPD exacerbation contributed to hyperglycemia, now improving given pt off steroids. Will continue to monitor CBGs and adjust insulin accordingly. - Continue Semglee 30 units daily - Continue Novolog 18 units TID - Continue SSI  Respiratory Alkalosis with concomitate Metabolic Alkalosis, improving VBG done 7/31 with pH 7.45 and pCO2 47, improved from prior. Contraction alkalosis resolving. Pt likely has respiratory acidosis due to COPD with compensating metabolic alkalosis at baseline.    Chronic Conditions: Normocytic anemia: Stable. Hypertension: Holding amlodipine 10 mg daily at nephro's rec. Holding Entresto, spironolactone; cardiology does not recommend restarting these medications. Hyperlipidemia, CAD: Continue atorvastatin 80 mg daily, Zetia 10 mg daily, ASA 81 mg daily.  Code Status: Full Diet: Renal/carb modified with fluid restriction ( ) IV Fluid: None DVT Prophylaxis: Eliquis 5 mg BID  Dispo: Pt approved for [facility] ([facility]); anticipate discharge to [facility] for continued management of O2 requirements   LOS: 8 days   Governor Rooks, Medical Student  06/23/2023, 1:00 PM

## 2023-06-23 NOTE — Progress Notes (Signed)
   06/23/23 1411  Spiritual Encounters  Type of Visit Initial  Care provided to: Patient  Reason for visit Routine spiritual support  OnCall Visit No   Reason For Visit:    Chaplain visited Pt in response to a Spiritual Consult she placed asking for prayer.  This was a f/u visit as I had had a great conversation with Pt earlier this week regarding Advance Care Directives  Interventions:     Explored Pt spiritual needs and resources; Identified, evaluated, and reinforced appropriate coping strategies; listened empathetically, and cultivated a relationship of care and support. Provided prayer upon Pt request  Outcomes:     Pt progressed toward acceptance (of diagnosis and treatment plan); Pt expressed intermediate hope in wanting to get better and stronger, and she expressed ultimate hope in heaven because of her faith.  Pt expressed peace and verbally processed her emotions with chaplain  Assessment:      Needs- No pressing spiritual, emotional, or relational needs identified other than the need for encouragement. Resources- Pt finds strength in her faith, but she is also a resilient person who has survived much hardship in her life and knows how to fight. Description- Chaplain was able to establish a quick connection with this Pt and she displays a remarkable light in her demeanor and her positive attitude.  Though she is aware of the seriousness of her health, she is committed to remaining positive and fighting a good fight.  Plan:     Chaplain will continue to follow Pt as long as she is here.  Pt specifically told me "if they move me, please find me!"

## 2023-06-24 DIAGNOSIS — Z87891 Personal history of nicotine dependence: Secondary | ICD-10-CM | POA: Diagnosis not present

## 2023-06-24 DIAGNOSIS — I5031 Acute diastolic (congestive) heart failure: Secondary | ICD-10-CM

## 2023-06-24 DIAGNOSIS — J441 Chronic obstructive pulmonary disease with (acute) exacerbation: Secondary | ICD-10-CM | POA: Diagnosis not present

## 2023-06-24 DIAGNOSIS — J9621 Acute and chronic respiratory failure with hypoxia: Secondary | ICD-10-CM | POA: Diagnosis not present

## 2023-06-24 LAB — MAGNESIUM: Magnesium: 2.2 mg/dL (ref 1.7–2.4)

## 2023-06-24 LAB — SARS CORONAVIRUS 2 BY RT PCR: SARS Coronavirus 2 by RT PCR: NEGATIVE

## 2023-06-24 LAB — GLUCOSE, CAPILLARY
Glucose-Capillary: 159 mg/dL — ABNORMAL HIGH (ref 70–99)
Glucose-Capillary: 133 mg/dL — ABNORMAL HIGH (ref 70–99)
Glucose-Capillary: 157 mg/dL — ABNORMAL HIGH (ref 70–99)
Glucose-Capillary: 85 mg/dL (ref 70–99)

## 2023-06-24 MED ORDER — POTASSIUM CHLORIDE CRYS ER 20 MEQ PO TBCR
40.0000 meq | EXTENDED_RELEASE_TABLET | Freq: Every day | ORAL | Status: DC
  Administered 2023-06-24 – 2023-06-28 (×5): 40 meq via ORAL
  Filled 2023-06-24 (×5): qty 2

## 2023-06-24 MED ORDER — ONDANSETRON HCL 4 MG/2ML IJ SOLN
4.0000 mg | Freq: Four times a day (QID) | INTRAMUSCULAR | Status: DC | PRN
  Administered 2023-06-28: 4 mg via INTRAVENOUS
  Filled 2023-06-24 (×2): qty 2

## 2023-06-24 NOTE — TOC Progression Note (Addendum)
Transition of Care [facility]) - Progression Note    Patient Details  Name: Alicia [name] MRN: [id number] Date of Birth: 1955/12/30  Transition [facility] CM/SW Contact  Leone Haven, RN Phone Number: 06/24/2023, 3:34 PM  Clinical Narrative:    Per Victorino Dike with [facility], Peer to peer has been offered for [facility].The MD can call [phone], opt 5. This offer expires @ 10:00 am on 06/25/23. member WG:[id number] DOB: 02-02-1956 .  MD is aware.  P2P done, [facility] has been approved, plan for [facility] tomorrow.    Expected Discharge Plan: Home w Home Health Services Barriers to Discharge: Continued Medical Work up  Expected Discharge Plan and Services In-house Referral: NA Discharge Planning Services: CM Consult Post Acute Care Choice: Home Health, Durable Medical Equipment Living arrangements for the past 2 months: Single Family Home                 DME Arranged: Walker rolling with seat DME Agency: [employer] Date DME Agency Contacted: 06/18/23 Time DME Agency Contacted: [phone] Representative spoke with at DME Agency: Vaughan Basta HH Arranged: RN, PT, OT HH Agency: [facility] Date [facility] Agency Contacted: 06/18/23 Time HH Agency Contacted: 1619 Representative spoke with at [facility] Agency: Kandee Keen   Social Determinants of Health (SDOH) Interventions SDOH Screenings   Food Insecurity: No Food Insecurity (06/18/2023)  Housing: Low Risk  (06/18/2023)  Transportation Needs: No Transportation Needs (06/18/2023)  Utilities: Not At Risk (06/18/2023)  Alcohol Screen: Low Risk  (06/18/2023)  Depression (PHQ2-9): Low Risk  (05/25/2023)  Financial Resource Strain: Low Risk  (06/18/2023)  Physical Activity: Inactive (05/19/2023)  Social Connections: Socially Isolated (05/19/2023)  Stress: No Stress Concern Present (05/19/2023)  Tobacco Use: Medium Risk (06/15/2023)    Readmission Risk Interventions     No data to display

## 2023-06-24 NOTE — Progress Notes (Signed)
Occupational Therapy Treatment Patient Details Name: Alicia [name] MRN: [id number] DOB: Apr 21, 1956 Today's Date: 06/24/2023   History of present illness 67 y.o. female presents to [facility] hospital on 06/15/2023 with SOB, cough and ear pain. Pt required BiPAP in the ED due to hypoxia. Pt admitted for CHF and COPD exacerbation 2/2 URI. 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   OT comments  Patient on BiPAP with SpO2 at 92-94%. Patient agreeable to get OOB and into recliner. Patient requiring min assist and increased time for bed mobility and transfers on this date due to back pain and activity tolerance. Once in recliner energy conservation strategies reviewed following handout. Current discharge recommendations are for LTACH. Acute OT to continue to follow.    Recommendations for follow up therapy are one component of a multi-disciplinary discharge planning process, led by the attending physician.  Recommendations may be updated based on patient status, additional functional criteria and insurance authorization.    Assistance Recommended at Discharge Frequent or constant Supervision/Assistance  Patient can return home with the following  A little help with walking and/or transfers;A lot of help with bathing/dressing/bathroom;Assistance with cooking/housework;Assist for transportation;Help with stairs or ramp for entrance   Equipment Recommendations  Tub/shower bench;BSC/3in1    Recommendations for Other Services      Precautions / Restrictions Precautions Precautions: Fall Precaution Comments: monitor HR and sats, BiPAP FiO2 65% Restrictions Weight Bearing Restrictions: No       Mobility Bed Mobility Overal bed mobility: Needs Assistance Bed Mobility: Rolling, Sidelying to Sit Rolling: Min guard Sidelying to sit: Min assist, HOB elevated       General bed mobility comments: assistance to elevate  trunk into sitting with HOB elevated    Transfers Overall transfer level: Needs assistance Equipment used: Rollator (4 wheels) Transfers: Sit to/from Stand, Bed to chair/wheelchair/BSC Sit to Stand: Min assist     Step pivot transfers: Min assist     General transfer comment: min assist to power up and for safety due to lines     Balance Overall balance assessment: Needs assistance Sitting-balance support: Bilateral upper extremity supported Sitting balance-Leahy Scale: Good     Standing balance support: Bilateral upper extremity supported Standing balance-Leahy Scale: Poor Standing balance comment: reliant on UE support when standing                           ADL either performed or assessed with clinical judgement   ADL Overall ADL's : Needs assistance/impaired                                       General ADL Comments: focused on bed mobility, transfers, and energy conservation    Extremity/Trunk Assessment              Vision       Perception     Praxis      Cognition Arousal/Alertness: Awake/alert Behavior During Therapy: WFL for tasks assessed/performed Overall Cognitive Status: Within Functional Limits for tasks assessed                                 General Comments: states she feels like she has more energy on BiPAP  Exercises      Shoulder Instructions       General Comments BiPAP FiO2 65% SpO2 92-94%    Pertinent Vitals/ Pain       Pain Assessment Pain Assessment: Faces Faces Pain Scale: Hurts a little bit Pain Location: back Pain Descriptors / Indicators: Aching, Grimacing, Guarding Pain Intervention(s): Limited activity within patient's tolerance, Monitored during session, Repositioned, Patient requesting pain meds-RN notified  Home Living                                          Prior Functioning/Environment              Frequency  Min 1X/week         Progress Toward Goals  OT Goals(current goals can now be found in the care plan section)  Progress towards OT goals: Progressing toward goals  Acute Rehab OT Goals Patient Stated Goal: get better OT Goal Formulation: With patient Time For Goal Achievement: 07/01/23 Potential to Achieve Goals: Good ADL Goals Pt Will Perform Upper Body Dressing: with supervision;sitting Pt Will Perform Lower Body Dressing: with supervision;with adaptive equipment;sitting/lateral leans;sit to/from stand Pt Will Transfer to Toilet: with supervision;ambulating;regular height toilet Additional ADL Goal #1: pt will be able to complete 2 standing functional tasks with SpO2 at or above 95% in order to improve activity tolerance for ADLs Additional ADL Goal #2: pt will verbalize x3 energy conservation strategies in prep for ADLs  Plan Discharge plan needs to be updated;Frequency remains appropriate    Co-evaluation                 AM-PAC OT "6 Clicks" Daily Activity     Outcome Measure   Help from another person eating meals?: None Help from another person taking care of personal grooming?: A Little Help from another person toileting, which includes using toliet, bedpan, or urinal?: A Little Help from another person bathing (including washing, rinsing, drying)?: A Lot Help from another person to put on and taking off regular upper body clothing?: A Little Help from another person to put on and taking off regular lower body clothing?: A Little 6 Click Score: 18    End of Session Equipment Utilized During Treatment: Oxygen;Rollator (4 wheels)  OT Visit Diagnosis: Unsteadiness on feet (R26.81);Other abnormalities of gait and mobility (R26.89);Muscle weakness (generalized) (M62.81)   Activity Tolerance Patient tolerated treatment well   Patient Left in chair;with call bell/phone within reach;with chair alarm set   Nurse Communication Mobility status;Precautions;Patient requests pain  meds        Time: 6962-9528 OT Time Calculation (min): 35 min  Charges: OT General Charges $OT Visit: 1 Visit OT Treatments $Therapeutic Activity: 23-37 mins  Alfonse Flavors, OTA Acute Rehabilitation Services  Office [phone]   Dewain Penning 06/24/2023, 1:25 PM

## 2023-06-24 NOTE — Progress Notes (Signed)
   Heart Failure Stewardship Pharmacist Progress Note   PCP: [name], MD PCP-Cardiologist: Donato Schultz, MD    HPI:  67 yo F with PMH of COPD, oxygen dependent, CHF (EF 20-25% in 2015, recovered to 55-60%), CAD, HLD, GERD, T2DM, and CVA.    Admitted in Nov 2023 with acute on chronic CHF exacerbation. Diuresed w/ IV Lasix and treated w/ abx, steroids and nebs. Echo showed normal EF 55-60% and normal RV. After diureses, was transitioned to PO Lasix. Also placed on Entresto.   She was seen in HF TOC 10/2022. Jardiance increased to 10 mg daily and spironlactone added. She was seen back in [facility] 11/2022 and spironolactone was increased to 25 mg daily. She was later seen at [facility] on 1/30 and lasix was decreased to 80 mg AM and 40 mg PM.  Admitted 01/2023 with acute on chronic CHF exacerbation. ECHO 3/17 showed LVEF 60-65%. Diuresed with IV lasix 120 mg, metolazone, and diamox. Discharged on Eentresto, spironolactone, amlodipine, and lasix. [name] held due to UTI.   Presented to the ED on 7/23 with increasing O2 needs and desaturations. CXR showed pulmonary congestion. BNP elevated. ECHO 7/24 showed LVEF 50-60%, G3DD, no RWMA, RV normal, moderately elevated PA pressure.   Current HF Medications: Diuretic: torsemide 40 mg daily  Prior to admission HF Medications: Diuretic: torsemide 60 mg daily ACE/ARB/ARNI: Entresto 97/103 mg BID MRA: spironolactone 25 mg daily  Pertinent Lab Values: Serum creatinine 1.56, BUN 106, Potassium 3.7, Sodium 128, BNP 3750.1, Magnesium 2.2, A1c 8.2   Vital Signs: Weight: 213 lbs (admission weight: 221 lbs) Blood pressure: 100/50s  Heart rate: 50s  I/O: net -11.7L since admission  Medication Assistance / Insurance Benefits Check: Does the patient have prescription insurance?  Yes Type of insurance plan: [place] Medicaid  Outpatient Pharmacy:  [facility] Is the patient willing to use [facility] TOC pharmacy at discharge?  Yes Is the patient willing to transition their outpatient pharmacy to utilize a [facility] outpatient pharmacy?   Pending    Assessment: 1. 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 NYHA class III symptoms. - Agree with resuming torsemide 40 mg daily with ongoing shortness of breath and hypoxia. Strict I/Os and daily weights. Keep K>4 and Mg>2. KCl 40 mEq daily ordered for supplementation. - Holding metoprolol. History of bradycardia. Rates have dropped again. - Holding Entresto and spironolactone with AKI - Held Farxiga last admission with UTI - consider restarting this admission if renal function stabillizes  Plan: 1) Medication changes recommended at this time: - Agree with changes  2) Patient assistance: - None pending, has [place] Medicaid   3)  Education  - Patient has been educated on current HF medications and potential additions to HF medication regimen - Patient verbalizes understanding that over the next few months, these medication doses may change and more medications may be added to optimize HF regimen - Patient has been educated on basic disease state pathophysiology and goals of therapy   Sharen Hones, PharmD, BCPS Heart Failure Stewardship Pharmacist Phone [phone]

## 2023-06-24 NOTE — Progress Notes (Signed)
Subjective:  Alicia [name] is a 67 y.o. female with a PMHx significant for COPD, HFpEF, T2DM, CAD, HTN, GERD who was admitted for acute hypoxic respiratory failure likely multifactorial from acute on chronic HFpEF exacerbation and acute COPD exacerbation. Pt remains hospitalized for continued respiratory therapy with O2 supplementation.   ON Events significant for patient again needing BiPAP intermittently overnight. She was able to return to HFNC early this morning, but then requested BiPAP again for shortness of breath and was on this when we saw her this morning.  Pt was examined at bedside today. She was on BiPAP during our interview. She reports feeling tired today with trouble breathing when she is not on BiPAP; she reports stable breathing on BiPAP. She reports increased cough last night, which she reports prompted her trouble breathing and need for BiPAP; her SOB comes on quickly and resolves quickly with BiPAP. Cough is intermittently productive with clear phlegm. Last BM was last night, small and not diarrhea. She reports nausea after eating. Pt's O2 sats around 92-94% on BiPAP throughout visit, not desatting during physical exam when asked to move.  Pt saw the chaplain yesterday, which she reports was helpful for her.  Objective:  Vital signs in last 24 hours: Vitals:   06/24/23 0414 06/24/23 0602 06/24/23 0739 06/24/23 1112  BP:   (!) 109/47 (!) 103/54  Pulse:   (!) 58 60  Resp:   20 (!) 23  Temp: (!) 97.5 F (36.4 C)  99.4 F (37.4 C)   TempSrc: Oral  Oral   SpO2:   90% 90%  Weight:  97 kg    Height:       Weight change: 0 kg  Intake/Output Summary (Last 24 hours) at 06/24/2023 1151 Last data filed at 06/24/2023 1122 Gross per 24 hour  Intake 600 ml  Output 1250 ml  Net -650 ml   Physical Exam General: Pt is sitting back in bed with head of bed slightly raise. No acute distress. Cardiovascular: RRR, no murmurs, rubs, gallops. Pulmonary: Pt is on BiPAP at FiO2 65%.  Normal work of breathing with intermittent cough when asked to take deep breaths. Slight expiratory wheezing bilaterally, poor air movement.  Abdomen: Normal bowel sounds. Soft and non distended; no tenderness to palpation. MSK: No edema of bilateral lower extremities. Warm, dry. Neuro/Psych: Alert and oriented to person, place, event. (Time not formerly assessed.) Normal affect.     Latest Ref Rng & Units 06/24/2023    3:28 AM 06/23/2023    1:22 PM 06/23/2023    1:31 AM  CMP  Glucose 70 - 99 mg/dL 782  956  213   BUN 8 - 23 mg/dL 086  578  469   Creatinine 0.44 - 1.00 mg/dL 6.29  5.28  4.13   Sodium 135 - 145 mmol/L 128  129  129   Potassium 3.5 - 5.1 mmol/L 3.7  3.8  2.8   Chloride 98 - 111 mmol/L 86  86  84   CO2 22 - 32 mmol/L 31  31  31    Calcium 8.9 - 10.3 mg/dL 8.2  8.4  8.2   Mag 2.2  Phos 4.5  Alb 2.5     Latest Ref Rng & Units 06/24/2023    3:28 AM 06/23/2023   10:04 AM 06/22/2023    3:39 AM  CBC  WBC 4.0 - 10.5 K/uL 10.7  12.4  11.9   Hemoglobin 12.0 - 15.0 g/dL 24.4  01.0  27.2  Hematocrit 36.0 - 46.0 % 31.3  31.3  33.4   Platelets 150 - 400 K/uL 296  323  339    Assessment/Plan:  Principal Problem:   Acute on chronic hypoxic respiratory failure (HCC) Active Problems:   COPD (chronic obstructive pulmonary disease) (HCC)   Chronic respiratory failure with hypoxia, on home oxygen therapy (HCC)   AKI (acute kidney injury) (HCC)   Acute decompensated heart failure (HCC)   Acute on chronic heart failure with preserved ejection fraction (HCC)   Diastolic CHF, chronic (HCC)   Acute respiratory failure with hypoxia [facility])  Alicia [name] is a 67 y.o. female with a PMHx significant for COPD, HFpEF, T2DM, CAD, HTN, GERD who was admitted for acute hypoxic respiratory failure likely multifactorial from acute on chronic HFpEF exacerbation and acute COPD exacerbation. Pt remains hospitalized for continued respiratory therapy with O2 supplementation.   Acute on Chronic hypoxic  respiratory failure 2/2 COPD exacerbation, Acute on chronic HFpEF exacerbation Acute hypoxic respiratory failure secondary to HFpEF exacerbation and COPD exacerbation with suspected viral URI trigger. Echocardiogram done 06/16/23 showed LVEF 55-60%, elevated left end diastolic pressure, and IVC dilation suggestive of fluid overload. Pt weaned from BiPAP to HFNC over course of hospitalization with intermittent BiPAP requirements following including briefly requiring BiPAP last night (7/31). Currently on HFNC. Repeat CXR done 7/31 after increased O2 needs showed signs of fluid, especially on R side; this was after am torsemide held for low K. Torsemide restarted in pm on 7/31 as K improved. Pt continues to have fluctuating O2 needs; did do some chart review and found her daughter (who she lives with) tested positive for COVID prior to pt's admission. Although initial viral testing negative, will get PCR COVID testing to ensure current symptoms not stemming from infection.   Goal is to wean pt's O2 as much as possible with pt at stable levels of daytime O2 via HFNC with as-needed nighttime BiPAP use; do not suspect she will be able to return to baseline levels at discharge. Will try to get pt up and out of chair more frequently to allow for better lung functioning in different position. Pt will discharge to [facility] for continued HFNC O2 support and continued work with PT/OT.   - CONTINUE torsemide 40 mg daily - Continue O2 supplementation with HFNC - PCR COVID testing - Continue to work with PT/OT; pt to get out of bed for meals (TID) as tolerated - Continue to follow with chaplain  Nausea Pt reports nausea after eating. Will arrange for prn Zofran. - IV Zofran 4 mg q6h prn   HFpEF Pt on torsemide 40 mg daily for diuresis; currently holding Entresto, spironolactone with no plan to restart at discharge. Can consider restarting [name] if renal function stabilizes (previously held last admission with UTI).  Goal K>4 and Mag>2. Diuresing with torsemide 40 mg daily. Continue to monitor electrolytes. Pt to follow-up with cardiology outpatient; appointment scheduled on 07/08/2023 at 8:25am at [facility]. - START K Cl 40 mEq daily - CONTINUE torsemide 40 mg daily - Monitor metabolic panels   COPD Pt previously on baseline of 4-5 L O2; currently requiring much higher supplementation. Will continue pt on Pulmicort, Brovana, ipratropium, levalbuterol (increased PVCs on albuterol). Scheduled guaifenesin q4h, nebulized hypertonic NS BID to improve cough. Encouraged regular spirometry use. - Pulmicort, Brovana, ipratropium q8h, levalbuterol q8h; to discharge on triple therapy inhaler - Levalbuterol prn q6h, ipratropium prn q6h - Guaifenesin q4h, nebulized hypertonic NS BID  AKI, non oliguric; improving Potential cardiorenal component of AKI given pt has experienced improved kidney function with diuresis with good urine output. Baseline Cr of 0.9. Elevated Cr and BUN thought to be due to overdiuresis, pt switched from IV Lasix to po torsemide at reduced dose with improvement in Cr and BUN. Pt currently on purewick. - Continue to trend Cr   T2DM Home regimen includes Trulicity, Lantus and Humalog. Has been on Semglee, Novolog, SSI in hospital; initially required increasing insulin needs with elevated fasting glucoses, stabilized after steroids ended. Suspect steroids given for COPD exacerbation contributed to hyperglycemia, now improved given pt off steroids. Will continue to monitor CBGs and adjust insulin accordingly. - Continue Semglee 30 units daily - Continue Novolog 18 units TID - Continue SSI  Nonsustained asymptomatic Vtach Bradycardia with Junctional Rhythm Paroxysmal Atrial Fibrillation with RVR Hx of AV nodal dysfunction. Episode of asymptomatic, nonsustained Vtach 7/25. New onset A fib with RVR this hospitalization, improved with lopressor, pt started on Eliquis for  CHADSVASC of 6; etiology of A fib multifactorial. Pt with HRs in the 50s; lopressor held. Pt to follow-up with cardiology outpatient; appointment scheduled on 07/08/2023 at 8:25am at [facility] at [facility]. - Continue telemetry - Eliquis 5 mg BID for stroke prophylaxis - HOLD lopressor; limit/avoid AV nodal blocking agents  Chronic Conditions: Normocytic anemia: Stable. Hypertension: Holding amlodipine 10 mg daily at nephro's rec. Holding Entresto, spironolactone; cardiology does not recommend restarting these medications. Hyperlipidemia, CAD: Continue atorvastatin 80 mg daily, Zetia 10 mg daily, ASA 81 mg daily.   Code Status: Full Diet: Renal/carb modified with fluid restriction ( ) IV Fluid: None DVT Prophylaxis: Eliquis 5 mg BID   Dispo: Pt approved for LTACH ([facility]); anticipate discharge to [facility] once O2 needs stable   LOS: 9 days   Governor Rooks, Medical Student  06/24/2023, 11:51 AM

## 2023-06-24 NOTE — Progress Notes (Signed)
Patient out of bed and sitting in the recliner in her room. Patient earlier requested to get back on BIPAP due to shortness of breath, increased work of breathing, and her complaining of being afraid of falling asleep without the BIPAP.  RT Heather notified to come and evaluate patient per MD request. Elnita Maxwell, RN

## 2023-06-24 NOTE — Progress Notes (Signed)
Spoke with pt's daughter, Gloriajean Dell, by phone at [phone] to provide her an update on the pt's status. Informed pt's daughter that the pt is currently doing well on HFNC, and our goal remains to keep pt on HFNC during the day with BiPAP use only at night as needed. Informed daughter plan for pt is to get her O2 needs stable on HFNC, decreasing this as much as we are able, before discharging her to the [facility] facility on the 5th floor of the [facility]. Also informed daughter that we are currently waiting on a COVID PCR test on pt, and that pt is on contact precautions while this test is resulting.

## 2023-06-24 NOTE — Plan of Care (Signed)
  Problem: Education: Goal: Ability to describe self-care measures that may prevent or decrease complications (Diabetes Survival Skills Education) will improve Outcome: Progressing   Problem: Coping: Goal: Ability to adjust to condition or change in health will improve Outcome: Progressing   Problem: Fluid Volume: Goal: Ability to maintain a balanced intake and output will improve Outcome: Progressing   Problem: Health Behavior/Discharge Planning: Goal: Ability to identify and utilize available resources and services will improve Outcome: Progressing   Problem: Health Behavior/Discharge Planning: Goal: Ability to identify and utilize available resources and services will improve Outcome: Progressing   Problem: Health Behavior/Discharge Planning: Goal: Ability to manage health-related needs will improve Outcome: Progressing

## 2023-06-25 ENCOUNTER — Inpatient Hospital Stay (HOSPITAL_COMMUNITY): Payer: 59

## 2023-06-25 DIAGNOSIS — I5033 Acute on chronic diastolic (congestive) heart failure: Secondary | ICD-10-CM | POA: Diagnosis not present

## 2023-06-25 DIAGNOSIS — I5031 Acute diastolic (congestive) heart failure: Secondary | ICD-10-CM

## 2023-06-25 DIAGNOSIS — J9621 Acute and chronic respiratory failure with hypoxia: Secondary | ICD-10-CM | POA: Diagnosis not present

## 2023-06-25 LAB — BLOOD GAS, VENOUS
Acid-Base Excess: 7 mmol/L — ABNORMAL HIGH (ref 0.0–2.0)
Bicarbonate: 34.5 mmol/L — ABNORMAL HIGH (ref 20.0–28.0)
Drawn by: 7964
O2 Saturation: 21.9 %
Patient temperature: 36.6
pCO2, Ven: 60 mmHg (ref 44–60)
pH, Ven: 7.37 (ref 7.25–7.43)
pO2, Ven: <31 mmHg — CL (ref 32–45)

## 2023-06-25 LAB — CULTURE, RESPIRATORY W GRAM STAIN: Gram Stain: NONE SEEN

## 2023-06-25 LAB — RENAL FUNCTION PANEL
Albumin: 2.6 g/dL — ABNORMAL LOW (ref 3.5–5.0)
Anion gap: 14 (ref 5–15)
BUN: 106 mg/dL — ABNORMAL HIGH (ref 8–23)
CO2: 29 mmol/L (ref 22–32)
Calcium: 8.5 mg/dL — ABNORMAL LOW (ref 8.9–10.3)
Chloride: 87 mmol/L — ABNORMAL LOW (ref 98–111)
Creatinine, Ser: 1.57 mg/dL — ABNORMAL HIGH (ref 0.44–1.00)
GFR, Estimated: 36 mL/min — ABNORMAL LOW (ref >60)
Glucose, Bld: 119 mg/dL — ABNORMAL HIGH (ref 70–99)
Phosphorus: 4.2 mg/dL (ref 2.5–4.6)
Potassium: 4 mmol/L (ref 3.5–5.1)
Sodium: 130 mmol/L — ABNORMAL LOW (ref 135–145)

## 2023-06-25 LAB — EXPECTORATED SPUTUM ASSESSMENT W GRAM STAIN, RFLX TO RESP C

## 2023-06-25 LAB — GLUCOSE, CAPILLARY
Glucose-Capillary: 166 mg/dL — ABNORMAL HIGH (ref 70–99)
Glucose-Capillary: 169 mg/dL — ABNORMAL HIGH (ref 70–99)
Glucose-Capillary: 95 mg/dL (ref 70–99)
Glucose-Capillary: 95 mg/dL (ref 70–99)

## 2023-06-25 LAB — MRSA NEXT GEN BY PCR, NASAL: MRSA by PCR Next Gen: DETECTED — AB

## 2023-06-25 MED ORDER — BUDESONIDE 0.5 MG/2ML IN SUSP
0.5000 mg | Freq: Two times a day (BID) | RESPIRATORY_TRACT | Status: DC
  Administered 2023-06-25 – 2023-06-28 (×6): 0.5 mg via RESPIRATORY_TRACT
  Filled 2023-06-25 (×6): qty 2

## 2023-06-25 MED ORDER — SODIUM CHLORIDE 0.9 % IV SOLN
2.0000 g | Freq: Two times a day (BID) | INTRAVENOUS | Status: DC
  Administered 2023-06-25 – 2023-06-28 (×7): 2 g via INTRAVENOUS
  Filled 2023-06-25 (×7): qty 12.5

## 2023-06-25 MED ORDER — METHYLPREDNISOLONE SODIUM SUCC 40 MG IJ SOLR
40.0000 mg | Freq: Two times a day (BID) | INTRAMUSCULAR | Status: DC
  Administered 2023-06-25 – 2023-06-28 (×7): 40 mg via INTRAVENOUS
  Filled 2023-06-25 (×7): qty 1

## 2023-06-25 MED ORDER — VANCOMYCIN HCL IN DEXTROSE 1-5 GM/200ML-% IV SOLN
1000.0000 mg | INTRAVENOUS | Status: DC
  Administered 2023-06-25 – 2023-06-28 (×4): 1000 mg via INTRAVENOUS
  Filled 2023-06-25 (×4): qty 200

## 2023-06-25 MED ORDER — REVEFENACIN 175 MCG/3ML IN SOLN
175.0000 ug | Freq: Every day | RESPIRATORY_TRACT | Status: DC
  Administered 2023-06-26 – 2023-06-28 (×3): 175 ug via RESPIRATORY_TRACT
  Filled 2023-06-25 (×3): qty 3

## 2023-06-25 MED ORDER — FUROSEMIDE 10 MG/ML IJ SOLN
120.0000 mg | Freq: Once | INTRAVENOUS | Status: AC
  Administered 2023-06-25: 120 mg via INTRAVENOUS
  Filled 2023-06-25: qty 10

## 2023-06-25 MED ORDER — GUAIFENESIN-DM 100-10 MG/5ML PO SYRP
10.0000 mL | ORAL_SOLUTION | ORAL | Status: DC | PRN
  Administered 2023-06-26: 10 mL via ORAL
  Filled 2023-06-25: qty 10

## 2023-06-25 NOTE — Progress Notes (Signed)
   Heart Failure Stewardship Pharmacist Progress Note   PCP: [name], MD PCP-Cardiologist: Donato Schultz, MD    HPI:  67 yo F with PMH of COPD, oxygen dependent, CHF (EF 20-25% in 2015, recovered to 55-60%), CAD, HLD, GERD, T2DM, and CVA.    Admitted in Nov 2023 with acute on chronic CHF exacerbation. Diuresed w/ IV Lasix and treated w/ abx, steroids and nebs. Echo showed normal EF 55-60% and normal RV. After diureses, was transitioned to PO Lasix. Also placed on Entresto.   She was seen in HF TOC 10/2022. Jardiance increased to 10 mg daily and spironlactone added. She was seen back in [facility] 11/2022 and spironolactone was increased to 25 mg daily. She was later seen at [facility] on 1/30 and lasix was decreased to 80 mg AM and 40 mg PM.  Admitted 01/2023 with acute on chronic CHF exacerbation. ECHO 3/17 showed LVEF 60-65%. Diuresed with IV lasix 120 mg, metolazone, and diamox. Discharged on Eentresto, spironolactone, amlodipine, and lasix. [name] held due to UTI.   Presented to the ED on 7/23 with increasing O2 needs and desaturations. CXR showed pulmonary congestion. BNP elevated. ECHO 7/24 showed LVEF 50-60%, G3DD, no RWMA, RV normal, moderately elevated PA pressure.   Current HF Medications: Diuretic: torsemide 40 mg daily  Prior to admission HF Medications: Diuretic: torsemide 60 mg daily ACE/ARB/ARNI: Entresto 97/103 mg BID MRA: spironolactone 25 mg daily  Pertinent Lab Values: Serum creatinine 1.57, BUN 106, Potassium 4.0, Sodium 130, BNP 3750.1, Magnesium 2.2, A1c 8.2   Vital Signs: Weight: 213 lbs (admission weight: 221 lbs) Blood pressure: 120-140/60s  Heart rate: 50-60s  I/O: net -11.7L since admission  Medication Assistance / Insurance Benefits Check: Does the patient have prescription insurance?  Yes Type of insurance plan: [place] [employer]  Outpatient Pharmacy:  [facility] willing to use [facility] TOC pharmacy at  discharge? Yes Is the patient willing to transition their outpatient pharmacy to utilize a [facility] outpatient pharmacy?   No    Assessment: 1. 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 NYHA class II symptoms. - Continue torsemide 40 mg daily. Strict I/Os and daily weights. Keep K>4 and Mg>2. KCl 40 mEq daily ordered for supplementation. - Holding metoprolol. History of bradycardia. Rates have dropped again. - Holding Entresto and spironolactone with AKI. Creatinine stable today. - Held [place] last admission with UTI - consider restarting this admission if renal function stabillizes  Plan: 1) Medication changes recommended at this time: - Restart Farxiga 10 mg daily  2) Patient assistance: - None pending, has [place] Medicaid   3)  Education  - Patient has been educated on current HF medications and potential additions to HF medication regimen - Patient verbalizes understanding that over the next few months, these medication doses may change and more medications may be added to optimize HF regimen - Patient has been educated on basic disease state pathophysiology and goals of therapy   Sharen Hones, PharmD, BCPS Heart Failure Stewardship Pharmacist Phone [phone]

## 2023-06-25 NOTE — Progress Notes (Signed)
Subjective:  Zollie Clemence Joss is a 67 y.o. female with a PMHx significant for COPD, HFpEF, T2DM, CAD, HTN, GERD who was admitted for acute hypoxic respiratory failure likely multifactorial from acute on chronic HFpEF exacerbation and acute COPD exacerbation. Pt remains hospitalized for continued respiratory therapy with O2 supplementation.   ON Events significant for pt having a prolonged (~1 hr) episode of coughing with desats to 86%; she was transitioned to HFNC with FiO2 of 90% but continued to have low O2 and so was switched to BiPAP. She was taken off BiPAP early this morning but continued to require HFNC at 15 L with FiO2 of 90%.  Pt was examined at bedside today. She reports she is felling fine at the moment, but that she did have a "choking" sensation overnight and felt like she couldn't breath with increased cough. She reports cough has been productive, mostly with clear sputum. She denies current shortness of breath. She denied other concerns. Discussed possible need for ICU transfer/ventilator with pt if her O2 needs continue to remain elevated, though informed pt we hope that we don't have to go down this route. Pt expressed understanding but also reported that she did not believe she would need to be on a ventilator.  Respiratory therapy saw the pt after we had left, and reported they were able to wean pt down to HFNC at 15 L/min at FiO2 of 60%, and pt had been satting at 94% on this.  Objective:  Vital signs in last 24 hours: Vitals:   06/25/23 0726 06/25/23 0732 06/25/23 1034 06/25/23 1111  BP: (!) 146/64 (!) 146/84 126/63   Pulse: 63  61   Resp: (!) 23  20   Temp: 97.6 F (36.4 C)     TempSrc: Oral     SpO2: 100%  97% 93%  Weight:      Height:       Weight change: 0 kg  Intake/Output Summary (Last 24 hours) at 06/25/2023 1231 Last data filed at 06/25/2023 0900 Gross per 24 hour  Intake 714 ml  Output 300 ml  Net 414 ml   Physical Exam General: Pt is laying back in  bed with head of bed mildly elevated, no acute distress. Cardiovascular: RRR, no murmurs, rubs, gallops. Pulmonary: Pt is on HFNC. Exam somewhat limited by pt's limited mobility from slipped disc; normal work of breathing. Lungs with slight expiratory wheezes on R back side; unable to assess L back with pt positioning; L anterior upper field with slight expiratory wheeze. Poor air flow in lungs. Abdomen: Normal bowel sounds. MSK: No edema of bilateral lower extremities. Warm, dry. Neuro/Psych: Alert and oriented to person, place, event. (Time not formerly assessed.) Normal affect.     Latest Ref Rng & Units 06/25/2023   12:21 AM 06/24/2023    3:28 AM 06/23/2023    1:22 PM  CMP  Glucose 70 - 99 mg/dL 409  811  914   BUN 8 - 23 mg/dL 782  956  213   Creatinine 0.44 - 1.00 mg/dL 0.86  5.78  4.69   Sodium 135 - 145 mmol/L 130  128  129   Potassium 3.5 - 5.1 mmol/L 4.0  3.7  3.8   Chloride 98 - 111 mmol/L 87  86  86   CO2 22 - 32 mmol/L 29  31  31    Calcium 8.9 - 10.3 mg/dL 8.5  8.2  8.4   Phos: 4.1  Alb: 2.6  Mag: 2.2  Latest Ref Rng & Units 06/25/2023   12:21 AM 06/24/2023    3:28 AM 06/23/2023   10:04 AM  CBC  WBC 4.0 - 10.5 K/uL 10.8  10.7  12.4   Hemoglobin 12.0 - 15.0 g/dL 9.4  16.1  09.6   Hematocrit 36.0 - 46.0 % 29.5  31.3  31.3   Platelets 150 - 400 K/uL 276  296  323    COVID PCR 8/1: Negative  CXR 8/2: IMPRESSION: Stable bibasilar opacities as noted above.  Pending: Sputum culture  Assessment/Plan:  Principal Problem:   Acute on chronic hypoxic respiratory failure (HCC) Active Problems:   COPD (chronic obstructive pulmonary disease) (HCC)   Chronic respiratory failure with hypoxia, on home oxygen therapy (HCC)   AKI (acute kidney injury) (HCC)   Acute decompensated heart failure (HCC)   Acute on chronic heart failure with preserved ejection fraction (HCC)   Diastolic CHF, chronic (HCC)   Acute respiratory failure with hypoxia [facility])  Dolora Ridgely Yetman is a 67 y.o.  female with a PMHx significant for COPD, HFpEF, T2DM, CAD, HTN, GERD who was admitted for acute hypoxic respiratory failure likely multifactorial from acute on chronic HFpEF exacerbation and acute COPD exacerbation. Pt remains hospitalized for continued respiratory therapy with O2 supplementation.   Acute on Chronic hypoxic respiratory failure 2/2 COPD exacerbation, Acute on chronic HFpEF exacerbation Acute hypoxic respiratory failure secondary to HFpEF exacerbation and COPD exacerbation with suspected viral URI trigger. Pt initially weaned from BiPAP to HFNC over course of hospitalization with lowest O2 requirements of 8L via HFNC on 7/26. However, following this pt has been having increased O2 needs with continued need for BiPAP at nights and occasionally during the day. Most recently, in the am of 8/2 pt required HFNC with FiO2 of 90%. This is the highest O2 sat pt can have before requiring intubation. She was able to be weaned down to HFNC at 15 L/min at FiO2 of 60%, with stable O2 sat on this amount. Repeat CXR done 8/2 showed stable bibasilar opacities as compared to CXR done 7/31. PCR COVID done 8/1 was negative.  Concern for possible hospital aquired pneumonia given pt's continued increased O2 requirements, which are now more than when she was initially hospitalized. Despite CXR without overt evidence of PNA, concern that it might be present in bibasilar opacities. Given this along with the high O2 requirements, will empirically treat pt for HAP with IV Vancomycin and Cefepime. Will try for more diuresis with additional dose of IV Lasix 120 mg. Will continue to encourage pt to use spirometry and spend time out of bed in upright position. Will get repeat VBG. Will consult with PCCM to ensure pt does not need increased level of care requiring ICU transfer.  If pt is able to maintain good O2 saturation, no higher than 70% FiO2, and without desatting to the point of requiring ICU transfer, can discharge  pt to [facility]. We will wait on PCCM's assessment before making a decision about this today. Pt's LTACH Auth is approved through 07/07/23.   - CONTINUE torsemide 40 mg daily - START IV Lasix 120 mg one dose - Continue O2 supplementation with HFNC - Continue to work with PT/OT; pt to get out of bed for meals (TID) as tolerated - Continue to follow with chaplain - Sputum culture ordered - CXR ordered - VBG to be done today - IV Vanc 1000 mg daily + IV cefepime 2 g BID - MRSA swab pending; can dc Vanc if  negative - Consider discharge to [facility] pending stable O2 at current rate and eval by PCCM - For discharge to [facility], pt would need to have 70% or less FiO2 requirement.   HFpEF Echocardiogram done 06/16/23 showed LVEF 55-60%, elevated left end diastolic pressure, and IVC dilation suggestive of fluid overload. Pt on torsemide 40 mg daily for diuresis; currently holding Entresto, spironolactone with no plan to restart at discharge. Can consider restarting [name] if renal function stabilizes (previously held last admission with UTI). Goal K>4 and Mag>2. Diuresing with torsemide 40 mg daily. Continue to monitor electrolytes. Giving pt one additional dose IV Lasix today, 8/2, for further diuresis. Pt to follow-up with cardiology outpatient; appointment scheduled on 07/08/2023 at 8:25am at [facility]. - Continue K Cl 40 mEq daily - Continue torsemide 40 mg daily - START IV Lasix 120 mg one dose - Monitor metabolic panels   COPD Pt previously on baseline of 4-5 L O2; currently requiring much higher supplementation. Will continue pt on Pulmicort, Brovana, ipratropium, levalbuterol (increased PVCs on albuterol) while in hospital. Scheduled guaifenesin q4h, nebulized hypertonic NS BID to improve cough. Encouraged regular spirometry use. - Pulmicort, Brovana, ipratropium q8h, levalbuterol q8h; to discharge on triple therapy inhaler instead of home Dulera - Levalbuterol prn q6h,  ipratropium prn q6h - Guaifenesin q4h, nebulized hypertonic NS BID   AKI, non oliguric; improving Potential cardiorenal component of AKI given pt has experienced improved kidney function with diuresis with good urine output. Baseline Cr of 0.9. Elevated Cr and BUN thought to be due to overdiuresis, pt switched from IV Lasix to po torsemide at reduced dose with improvement in Cr and BUN. Pt currently on purewick. - Continue to trend Cr   T2DM Home regimen includes Trulicity, Lantus and Humalog. Has been on Semglee, Novolog, SSI in hospital; initially required increasing insulin needs with elevated fasting glucoses, stabilized after steroids ended. Suspect steroids given for COPD exacerbation contributed to hyperglycemia, now improved given pt off steroids. Will continue to monitor CBGs and adjust insulin accordingly. - Continue Semglee 30 units daily - Continue Novolog 18 units TID - Continue SSI   Nonsustained asymptomatic Vtach Bradycardia with Junctional Rhythm Paroxysmal Atrial Fibrillation with RVR Hx of AV nodal dysfunction. Episode of asymptomatic, nonsustained Vtach 7/25. New onset A fib with RVR this hospitalization, improved with lopressor, pt started on Eliquis for CHADSVASC of 6; etiology of A fib multifactorial. Pt with HRs in the 50s; lopressor held. Pt to follow-up with cardiology outpatient; appointment scheduled on 07/08/2023 at 8:25am at [facility]. - Continue telemetry - Eliquis 5 mg BID for stroke prophylaxis - HOLD lopressor; limit/avoid AV nodal blocking agents   Chronic Conditions: Normocytic anemia: Stable. Hypertension: Holding amlodipine 10 mg daily at nephro's rec. Holding Entresto, spironolactone; cardiology does not recommend restarting these medications. Hyperlipidemia, CAD: Continue atorvastatin 80 mg daily, Zetia 10 mg daily, ASA 81 mg daily.   Code Status: Full Diet: Renal/carb modified with fluid restriction ( ) IV Fluid:  None DVT Prophylaxis: Eliquis 5 mg BID   Dispo: Pt approved for LTACH [employer]) with insurance auth approved; anticipate discharge to [facility] once O2 needs stable   LOS: 10 days   Governor Rooks, Medical Student  06/25/2023, 12:31 PM

## 2023-06-25 NOTE — Progress Notes (Signed)
Pt placed on BiPAP for SOB   06/25/23 0316  BiPAP/CPAP/SIPAP  $ Non-Invasive Ventilator  Non-Invasive Vent Subsequent  BiPAP/CPAP/SIPAP Pt Type Adult  BiPAP/CPAP/SIPAP V60  Mask Type Full face mask  Mask Size Medium  Set Rate 8 breaths/min  Respiratory Rate 22 breaths/min  IPAP 16 cmH20  EPAP 8 cmH2O  FiO2 (%) 35 %  Minute Ventilation 10.5  Leak 0  Tidal Volume (Vt) 480  Press High Alarm 25 cmH2O  BiPAP/CPAP /SiPAP Vitals  Resp (!) 22  SpO2 99 %

## 2023-06-25 NOTE — Progress Notes (Addendum)
Physical Therapy Treatment Patient Details Name: Alicia [name] MRN: [id number] DOB: Sep 22, 1956 Today's Date: 06/25/2023   History of Present Illness 67 y.o. female presents to [facility] hospital on 06/15/2023 with SOB, cough and ear pain. Pt required BiPAP in the ED due to hypoxia. Pt admitted for CHF and COPD exacerbation 2/2 URI. 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    PT Comments  Pt reports feeling well but is very fatigued, SpO2 88-92% on 15L/50%fiO2 HHFNC at rest but reports no dyspnea. Pt agreeable to progression to EOB, overall requiring light physical assist to perform and very increased time (>5 minutes) to recover spO2 with rest and breathing technique. PT increased FiO2 to 62% with guidance of RT given pt having difficulty recovering sats, again pt in no distress at any point even with desats. Progression OOB limited by O2 needs and fatigue. Plan remains appropriate.  15LO2 50% FiO2 HHFNC, spo2 79-92%. PT increased FiO2 to 62% per instruction of RT, SpO2 91% at end of session    If plan is discharge home, recommend the following: Assist for transportation;Help with stairs or ramp for entrance;A little help with walking and/or transfers;A little help with bathing/dressing/bathroom;Assistance with cooking/housework   Can travel by private vehicle     No  Equipment Recommendations  None recommended by PT    Recommendations for Other Services       Precautions / Restrictions Precautions Precautions: Fall Precaution Comments: monitor HR and sats, on HHFNC 15LO2 50 increasing to 60% FiO2 Restrictions Weight Bearing Restrictions: No     Mobility  Bed Mobility Overal bed mobility: Needs Assistance Bed Mobility: Supine to Sit, Sit to Supine     Supine to sit: Min assist Sit to supine: Min assist   General bed mobility comments: assist for trunk rise, LE progression, and repositioning upon  return to supine. Increased time and effort, desat to 79% on 50%FiO2 15LO2 which recovers to 90% with breathing technique and 5 minutes of rest.    Transfers                   General transfer comment: unable to progress given significant desat    Ambulation/Gait                   Stairs             Wheelchair Mobility     Tilt Bed    Modified Rankin (Stroke Patients Only)       Balance Overall balance assessment: Needs assistance Sitting-balance support: Bilateral upper extremity supported Sitting balance-Leahy Scale: Good Sitting balance - Comments: can sit EOB and tolerates challenge to balance (seated LE exercise, weight shifting)                                    Cognition Arousal/Alertness: Awake/alert Behavior During Therapy: WFL for tasks assessed/performed Overall Cognitive Status: Within Functional Limits for tasks assessed                                          Exercises General Exercises - Lower Extremity Long Arc Quad: AROM, Both, 20 reps, Seated    General Comments General comments (skin integrity, edema, etc.): 15LO2 50% FiO2 HHFNC, spo2  79-92%. PT increased FiO2 to 62% per instruction of RT      Pertinent Vitals/Pain Pain Assessment Pain Assessment: Faces Faces Pain Scale: Hurts little more Pain Location: generalized Pain Descriptors / Indicators: Grimacing, Guarding Pain Intervention(s): Limited activity within patient's tolerance, Monitored during session, Repositioned    Home Living                          Prior Function            PT Goals (current goals can now be found in the care plan section) Acute Rehab PT Goals Patient Stated Goal: to get her strength back PT Goal Formulation: With patient Time For Goal Achievement: 07/01/23 Potential to Achieve Goals: Good Progress towards PT goals: Progressing toward goals    Frequency    Min 1X/week      PT Plan  Current plan remains appropriate    Co-evaluation              AM-PAC PT "6 Clicks" Mobility   Outcome Measure  Help needed turning from your back to your side while in a flat bed without using bedrails?: A Little Help needed moving from lying on your back to sitting on the side of a flat bed without using bedrails?: A Little Help needed moving to and from a bed to a chair (including a wheelchair)?: A Little Help needed standing up from a chair using your arms (e.g., wheelchair or bedside chair)?: A Little Help needed to walk in hospital room?: A Lot Help needed climbing 3-5 steps with a railing? : Total 6 Click Score: 15    End of Session Equipment Utilized During Treatment: Oxygen Activity Tolerance: Patient limited by fatigue Patient left: in chair;with call bell/phone within reach;with chair alarm set Nurse Communication: Mobility status PT Visit Diagnosis: Muscle weakness (generalized) (M62.81);Difficulty in walking, not elsewhere classified (R26.2)     Time: 7846-9629 PT Time Calculation (min) (ACUTE ONLY): 34 min  Charges:    $Therapeutic Activity: 23-37 mins PT General Charges $$ ACUTE PT VISIT: 1 Visit                     Marye Round, PT DPT Acute Rehabilitation Services Secure Chat Preferred  Office [phone]     Sheliah Plane 06/25/2023, 3:46 PM

## 2023-06-25 NOTE — Consult Note (Signed)
NAME:  Alicia [name], MRN:  [id number], DOB:  15-Sep-1956, LOS: 10 ADMISSION DATE:  06/15/2023, CONSULTATION DATE: 06/25/2023 REFERRING MD: Kerrin Champagne MD , CHIEF COMPLAINT: Acute hypoxic respiratory failure  History of Present Illness:   67 year old with history of emphysema, HFpEF on 4 to 5 L home oxygen presenting with fevers, weakness, nonproductive cough, acute hypoxic respiratory failure.  Negative COVID and flu test on admission.  She was treated with steroids, nebulizers and IV diuretics.  Over the weekend she went into A-fib with RVR and had to go on high flow nasal cannula.  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 (02/12/2016), Diverticulosis of colon (09/24/2017), Diverticulosis of colon (09/24/2017), Essential hypertension (10/15/2013), Gastroesophageal reflux disease (09/24/2017), History of cerebrovascular accident (05/10/2013), History of cerebrovascular accident (05/10/2013), Hyperlipidemia, Normocytic anemia (02/09/2023), Overweight (BMI 25.0-29.9) (09/24/2017), Psoriasis (09/24/2017), Seasonal allergic rhinitis due to pollen (09/24/2017), Small Bowel Obstruction (SBO) (01/13/2014), Thyroid nodule (09/04/2019), Tobacco use disorder (01/15/2014), and 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   Significant Hospital Events: Including procedures, antibiotic start and stop dates in addition to other pertinent events     Interim History / Subjective:    Objective   Blood  pressure 126/63, pulse 61, temperature 97.8 F (36.6 C), temperature source Oral, resp. rate 20, height 5\' 7"  (1.702 m), weight 97 kg, SpO2 93%.    FiO2 (%):  [35 %-100 %] 50 %   Intake/Output Summary (Last 24 hours) at 06/25/2023 1515 Last data filed at 06/25/2023 1236 Gross per 24 hour  Intake 714 ml  Output 300 ml  Net 414 ml   Filed Weights   06/23/23 0630 06/24/23 0602 06/25/23 0433  Weight: 97 kg 97 kg 97 kg    Examination: Blood pressure 126/63, pulse 61, temperature 97.8 F (36.6 C), temperature source Oral, resp. rate 20, height 5\' 7"  (1.702 m), weight 97 kg, SpO2 93%. Gen:      No acute distress HEENT:  EOMI, sclera anicteric Neck:     No masses; no thyromegaly Lungs:    Clear to auscultation bilaterally; normal respiratory effort CV:         Regular rate and rhythm; no murmurs Abd:      + bowel sounds; soft, non-tender; no palpable masses, no distension Ext:    No edema; adequate peripheral perfusion Skin:      Warm and dry; no rash Neuro: alert and oriented x 3 Psych: normal mood and affect   Labs/imaging reviewed Significant for sodium 130, BUN/creatinine 106/1.57 WBC 10.8, hemoglobin 9.4, platelets 276 Chest x-ray reviewed with bibasilar opacities.  Resolved Hospital Problem list     Assessment & Plan:  Acute on chronic hypoxic respiratory failure Initially treated for COPD exacerbation, CHF but now with increasing oxygen requirements Chest x-ray is grossly stable but may have developing HAP versus worsening COPD. Agree with broad antibiotic coverage as you are doing Continue diuresis Add IV Solu-Medrol 40 mg twice daily Continue Brovana.  Increase Pulmicort dose to 0.5 twice daily Add Yupelri nebs Incentive spirometry, mobilize to help with atelectasis. BiPAP at night as needed  Best Practice (right click and "  Reselect all SmartList Selections" daily)   Per Primary team  Signature:   Chilton Greathouse MD [facility] See Amion  for pager  If no response to pager , please call [phone] until 7pm After 7:00 pm call Elink  [phone] 06/25/2023, 3:27 PM

## 2023-06-25 NOTE — Progress Notes (Signed)
Pharmacy Antibiotic Note  Alicia [name] is a 67 y.o. female admitted on 06/15/2023 with pneumonia.  Pharmacy has been consulted for vanc/cefepime dosing.  Plan: Vancomycin 1000 mg IV every 24 hours.  eAUC ~517 mcg*hr/mL 9Scr 1.57) Cefepime 2 grams iv q12h  Height: 5\' 7"  (170.2 cm) Weight: 97 kg (213 lb 13.5 oz) IBW/kg (Calculated) : 61.6  Temp (24hrs), Avg:98.1 F (36.7 C), Min:97.6 F (36.4 C), Max:98.7 F (37.1 C)  Recent Labs  Lab 06/21/23 0235 06/22/23 0339 06/23/23 0131 06/23/23 1004 06/23/23 1322 06/24/23 0328 06/25/23 0021  WBC 11.4* 11.9*  --  12.4*  --  10.7* 10.8*  CREATININE 1.93* 1.99* 1.52*  --  1.49* 1.56* 1.57*    Estimated Creatinine Clearance: 42.7 mL/min (A) (by C-G formula based on SCr of 1.57 mg/dL (H)).    Allergies  Allergen Reactions   Canagliflozin Itching, Anxiety and Palpitations    Antimicrobials this admission: Vanc 8/2 >>  Cefepime 8/2 >>   Microbiology results: 8/2 MRSA PCR: ip  Thank you for allowing pharmacy to be a part of this patient's care.  Greta Doom BS, PharmD, BCPS Clinical Pharmacist 06/25/2023 10:16 AM  Contact: [phone] after 3 PM  "Be curious, not judgmental..." -Debbora Dus

## 2023-06-25 NOTE — Care Management Important Message (Signed)
Important Message  Patient Details  Name: Alicia [name] MRN: [id number] Date of Birth: 02-15-1956   Medicare Important Message Given:  Yes     Renie Ora 06/25/2023, 10:30 AM

## 2023-06-25 NOTE — Plan of Care (Signed)
  Problem: Fluid Volume: Goal: Ability to maintain a balanced intake and output will improve Outcome: Progressing   

## 2023-06-25 NOTE — Progress Notes (Signed)
Called to bedside for SOB. Pt had croupy cough x1hr. O2 Sats due to cough and mouth breathing dipped to 86%. Pt complained needed more air. NRBM placed w/much relief while BiPAP circuit being changed. BS - decreased air movement noted from previous 6 hours. Pt placed on BiPAP w/EPAP +8 and improved aeration noted. IPAP adjusted for comfort and RR.

## 2023-06-26 DIAGNOSIS — J9621 Acute and chronic respiratory failure with hypoxia: Secondary | ICD-10-CM | POA: Diagnosis not present

## 2023-06-26 LAB — GLUCOSE, CAPILLARY
Glucose-Capillary: 203 mg/dL — ABNORMAL HIGH (ref 70–99)
Glucose-Capillary: 217 mg/dL — ABNORMAL HIGH (ref 70–99)
Glucose-Capillary: 287 mg/dL — ABNORMAL HIGH (ref 70–99)
Glucose-Capillary: 296 mg/dL — ABNORMAL HIGH (ref 70–99)
Glucose-Capillary: 302 mg/dL — ABNORMAL HIGH (ref 70–99)

## 2023-06-26 LAB — BLOOD GAS, VENOUS
Acid-Base Excess: 9 mmol/L — ABNORMAL HIGH (ref 0.0–2.0)
Bicarbonate: 35.9 mmol/L — ABNORMAL HIGH (ref 20.0–28.0)
Drawn by: 7849
O2 Saturation: 62.1 %
Patient temperature: 36.6
pCO2, Ven: 57 mmHg (ref 44–60)
pH, Ven: 7.41 (ref 7.25–7.43)
pO2, Ven: 37 mmHg (ref 32–45)

## 2023-06-26 MED ORDER — ALUM & MAG HYDROXIDE-SIMETH 200-200-20 MG/5ML PO SUSP
15.0000 mL | Freq: Two times a day (BID) | ORAL | Status: DC | PRN
  Administered 2023-06-26: 15 mL via ORAL
  Filled 2023-06-26: qty 30

## 2023-06-26 MED ORDER — MUPIROCIN 2 % EX OINT
1.0000 | TOPICAL_OINTMENT | Freq: Two times a day (BID) | CUTANEOUS | Status: DC
  Administered 2023-06-26 – 2023-06-28 (×5): 1 via NASAL
  Filled 2023-06-26 (×2): qty 22

## 2023-06-26 MED ORDER — GUAIFENESIN 100 MG/5ML PO LIQD
10.0000 mL | ORAL | Status: DC
  Administered 2023-06-26 – 2023-06-28 (×12): 10 mL via ORAL
  Filled 2023-06-26 (×12): qty 10

## 2023-06-26 MED ORDER — CHLORHEXIDINE GLUCONATE CLOTH 2 % EX PADS
6.0000 | MEDICATED_PAD | Freq: Every day | CUTANEOUS | Status: DC
  Administered 2023-06-26 – 2023-06-28 (×3): 6 via TOPICAL

## 2023-06-26 MED ORDER — MAGNESIUM SULFATE 2 GM/50ML IV SOLN
2.0000 g | Freq: Once | INTRAVENOUS | Status: AC
  Administered 2023-06-26: 2 g via INTRAVENOUS
  Filled 2023-06-26: qty 50

## 2023-06-26 MED ORDER — GUAIFENESIN-DM 100-10 MG/5ML PO SYRP
10.0000 mL | ORAL_SOLUTION | Freq: Every day | ORAL | Status: DC
  Administered 2023-06-26 – 2023-06-27 (×2): 10 mL via ORAL
  Filled 2023-06-26 (×2): qty 10

## 2023-06-26 NOTE — Progress Notes (Signed)
Patient evaluated at bedside, concerns for non-sustained 14 Vtacs. Spoke with RN, Silva Bandy, states she gave her methylprednisolone, and the patient felt mild chest pain afterwards. Patient is overall doing well. She reported mild chest pain right after receiving her IV medication about an hour and half ago. However, that is currently resolved. Presently, no acute chest pain or pressure. Telemetry is reviewed. Patient is asymptomatic.

## 2023-06-26 NOTE — Plan of Care (Signed)
  Problem: Clinical Measurements: Goal: Will remain free from infection Outcome: Progressing Goal: Diagnostic test results will improve Outcome: Progressing Goal: Respiratory complications will improve Outcome: Progressing Goal: Cardiovascular complication will be avoided Outcome: Progressing   Problem: Activity: Goal: Risk for activity intolerance will decrease Outcome: Progressing   

## 2023-06-26 NOTE — Progress Notes (Signed)
Called to bedside for SOB/cough & desat. RN placed on NRBM w/relief of SOB & desat but pt continued to have frequent croupy cough w/some production of small amts of white secretions. BS somewhat diminished. Pt refused BiPAP @ this time if HHFNC could relieve her. Returned pt to [facility] 15L@70 %. Incrementaly w/out increasing the FIO2, the flow was eventually returned to 35L for pt comfort.

## 2023-06-26 NOTE — Progress Notes (Signed)
NAME:  Alicia [name], MRN:  [id number], DOB:  1956-03-24, LOS: 11 ADMISSION DATE:  06/15/2023, CONSULTATION DATE: 06/25/2023 REFERRING MD: Kerrin Champagne MD , CHIEF COMPLAINT: Acute hypoxic respiratory failure  History of Present Illness:   67 year old with history of emphysema, HFpEF on 4 to 5 L home oxygen presenting with fevers, weakness, nonproductive cough, acute hypoxic respiratory failure.  Negative COVID and flu test on admission.  She was treated with steroids, nebulizers and IV diuretics.  Over the weekend she went into A-fib with RVR and had to go on high flow nasal cannula.  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 (02/12/2016), Diverticulosis of colon (09/24/2017), Diverticulosis of colon (09/24/2017), Essential hypertension (10/15/2013), Gastroesophageal reflux disease (09/24/2017), History of cerebrovascular accident (05/10/2013), History of cerebrovascular accident (05/10/2013), Hyperlipidemia, Normocytic anemia (02/09/2023), Overweight (BMI 25.0-29.9) (09/24/2017), Psoriasis (09/24/2017), Seasonal allergic rhinitis due to pollen (09/24/2017), Small Bowel Obstruction (SBO) (01/13/2014), Thyroid nodule (09/04/2019), Tobacco use disorder (01/15/2014), and 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   Significant Hospital Events: Including procedures, antibiotic start and stop dates in addition to other pertinent events     Interim History / Subjective:   Overall feels better than  yesterday and a couple of days ago.  Objective   Blood pressure (!) 152/64, pulse (!) 51, temperature 97.8 F (36.6 C), temperature source Oral, resp. rate 20, height 5\' 7"  (1.702 m), weight 95 kg, SpO2 94%.    FiO2 (%):  [50 %-70 %] 55 %   Intake/Output Summary (Last 24 hours) at 06/26/2023 1014 Last data filed at 06/26/2023 0828 Gross per 24 hour  Intake 1232 ml  Output 1700 ml  Net -468 ml   Filed Weights   06/24/23 0602 06/25/23 0433 06/26/23 0524  Weight: 97 kg 97 kg 95 kg    Examination: Gen:    Chronically ill appearing, no respiratory distress HEENT:  on high flow nasal cannula Lungs:    breath sounds diminished, no wheezes, symmetric bilateral air entry CV:         RRR Abd:     Soft, nontender Ext:    No edema Skin:      Warm and dry; no rashes Neuro:   normal speech, no focal asymmetry  Labs/imaging reviewed Significant for sodium 130, BUN/creatinine 106/1.57 WBC 10.8, hemoglobin 9.4, platelets 276 Chest x-ray reviewed with bibasilar opacities.  Resolved Hospital Problem list     Assessment & Plan:  Acute on chronic hypoxic respiratory failure On Home oxygen 4-5LNC COPD with acute exacerbation Decompensated HFpEF  Initially treated for COPD exacerbation, CHF but now with increasing oxygen requirements Chest x-ray is grossly stable but may have developing HAP versus worsening COPD. Agree with broad antibiotic coverage as you are doing Continue diuresis Continue solumedrol Continue Brovana, yupelri, Pulmicort dose to 0.5 twice daily Incentive spirometry, mobilize to help with atelectasis. BiPAP at night - she would benefit from this at discharge every night.  She has severe COPD and did see me once in the clinic, but never followed up or obtained PFTs.  Can offer her follow up at discharge. I will make sure this is  in her AVS Agree with empiric HAP therapy with cefepime. No objection to [facility] disposition. I suspect she just needed some more  time.   Signature:   Durel Salts, MD Pulmonary and Critical Care Medicine [facility] 06/26/2023 11:05 AM Pager: see AMION  If no response to pager, please call critical care on call (see AMION) until 7pm After 7:00 pm call Elink

## 2023-06-26 NOTE — Progress Notes (Addendum)
Subjective:  Alicia [name] is a 67 y.o. female with a PMHx significant for COPD, HFpEF, T2DM, CAD, HTN, GERD who was admitted for acute hypoxic respiratory failure likely multifactorial from acute on chronic HFpEF exacerbation and acute COPD exacerbation. Pt remains hospitalized for continued respiratory therapy with O2 supplementation.  Overnight Events significant for episode of SOB and cough with desaturations around 2000, ultimately requiring increase of HFNC to 35L at FiO2 of 70%. Pt refused BiPAP at that time and did not require this overnight.  Pt was examined at bedside today. She reports she feels great today. She didn't have to use BiPAP last night. She did have a coughing spell last night, but feels completely recovered this morning. She's currently on HFNC at 30 L/min and FiO2 of 55%. She denies recurrence of diarrhea/loose stools. She denies abdominal pain, chest pain. She reports frequent urination yesterday; her purewick came off yesterday. She reports robatissuin D worked for her cough previously.  Objective:  Vital signs in last 24 hours: Vitals:   06/25/23 2008 06/25/23 2355 06/26/23 0504 06/26/23 0524  BP:  (!) 142/59 (!) 152/64   Pulse:  62 (!) 59 (!) 51  Resp:  18 18 20   Temp:  97.9 F (36.6 C) 97.8 F (36.6 C)   TempSrc:  Oral Oral   SpO2: 97% 93% 93% 94%  Weight:    95 kg  Height:       Weight change: -2 kg  Intake/Output Summary (Last 24 hours) at 06/26/2023 1107 Last data filed at 06/26/2023 6301 Gross per 24 hour  Intake 1232 ml  Output 1700 ml  Net -468 ml   Physical Exam General: Pt is lying down in hospital bed with head of bed elevated, appears comfortable and eating breakfast, no acute distress. Cardiovascular: RRR, no murmurs, rubs, gallops. Pulmonary: HFNC at 30 L/min and FiO2 of 55%. Normal work of breathing. Lungs clear to auscultation bilaterally; no wheezes auscultated. Extremities: No BLE edema; warm, dry. Neuro/Psych: Alert and oriented  to person, place, event. (Time not formerly assessed.) Normal affect.     Latest Ref Rng & Units 06/26/2023   12:34 AM 06/25/2023   12:21 AM 06/24/2023    3:28 AM  CMP  Glucose 70 - 99 mg/dL 601  093  235   BUN 8 - 23 mg/dL 91  573  220   Creatinine 0.44 - 1.00 mg/dL 2.54  2.70  6.23   Sodium 135 - 145 mmol/L 129  130  128   Potassium 3.5 - 5.1 mmol/L 4.8  4.0  3.7   Chloride 98 - 111 mmol/L 89  87  86   CO2 22 - 32 mmol/L 29  29  31    Calcium 8.9 - 10.3 mg/dL 8.5  8.5  8.2   Alb: 2.6  Phos 4.2  Mag 1.9     Latest Ref Rng & Units 06/26/2023   12:34 AM 06/25/2023   12:21 AM 06/24/2023    3:28 AM  CBC  WBC 4.0 - 10.5 K/uL 11.6  10.8  10.7   Hemoglobin 12.0 - 15.0 g/dL 9.7  9.4  76.2   Hematocrit 36.0 - 46.0 % 30.2  29.5  31.3   Platelets 150 - 400 K/uL 317  276  296    MRSA 8/2: Detected Sputum Culture 8/2: Preliminary with no WBC, Rare Gram positive cocci in pairs.  VBG 8/2: pH 7.37, pCO2 60, pO2 <31, Bicarb 34.5 VBG 8/3: pH 7.41, pCO2 57, pO2 < 37,  Bicarb 35.9  Assessment/Plan:  Principal Problem:   Acute on chronic hypoxic respiratory failure (HCC) Active Problems:   COPD (chronic obstructive pulmonary disease) (HCC)   Chronic respiratory failure with hypoxia, on home oxygen therapy (HCC)   AKI (acute kidney injury) (HCC)   Acute decompensated heart failure (HCC)   Acute on chronic heart failure with preserved ejection fraction (HCC)   Diastolic CHF, chronic (HCC)   Acute respiratory failure with hypoxia (HCC)   Acute heart failure with preserved ejection fraction (HFpEF) (HCC)  Alicia [name] is a 67 y.o. female with a PMHx significant for COPD, HFpEF, T2DM, CAD, HTN, GERD who was admitted for acute hypoxic respiratory failure likely multifactorial from acute on chronic HFpEF exacerbation and acute COPD exacerbation. Pt remains hospitalized for continued respiratory therapy with O2 supplementation.  Acute on Chronic hypoxic respiratory failure 2/2 COPD exacerbation, Acute  on chronic HFpEF exacerbation Acute hypoxic respiratory failure secondary to HFpEF exacerbation and COPD exacerbation with suspected viral URI trigger. Pt initially weaned from BiPAP to HFNC over course of hospitalization with lowest O2 requirements of 8L via HFNC on 7/26. However, following this pt has been having increased O2 needs with continued need for BiPAP at nights and occasionally during the day. Overnight on 8/1, pt was placed on HFNC with FiO2 of 90%-100% by nightshift RT; there was no documented reason of pt decompensation or desaturation. Upon assessment of this pt the following morning on 8/2, she was stable on HFNC at 15 L/min at FiO2 of 60%. She has not required FiO2 of greater than 70% following this. We did pursue workup of the increased FiO2 with repeat CXR done 8/2 showing stable bibasilar opacities as compared to CXR done 7/31. Pt started on IV Vancomycin and Cefepime over concern of possible HAP on 8/2. Pt seen by PCCM on 8/2 and did not believe pt needed ICU-level care at this time; they recommended increasing Pulmicort dose from 0.25 mg BID to 0.5 mg BID. They also recommended starting her on IV Solu-medrol 40 mg BID and Yupelri nebs in addition to her other breathing treatments. Will schedule nighttime cough medication given she's had nighttime cough consistently throughout hospitalization. Will continue to encourage pt to use spirometry and spend time out of bed in upright position.  Unclear whether pt's increased FiO2 on 8/1 was a true need resulting from decompensation. Pt has been doing well on HFNC at FiO2s 70% and lower for past 24 hours; will continue to monitor for an additional 24 hours before considering discharge to [facility]. If pt is able to maintain good O2 saturation, no higher than 70% FiO2, and without desatting to the point of requiring ICU transfer, can discharge pt to [facility]. Pt's LTACH Auth is approved through 07/07/23.   - Supplementing Mag with IV 2g once - Continue  torsemide 40 mg daily - Continue O2 supplementation with HFNC during daytime, BiPAP prn at night - Guaifenesin 10 mL five times a day, nebulized hypertonic NS BID - START Robitussin DM 10 mL daily at bedtime for cough - Continue to work with PT/OT; pt to get out of bed for meals (TID) as tolerated - Continue incentive spirometry - Continue to follow with chaplain - IV Vanc 1000 mg daily + IV cefepime 2 g BID; Day 2 - Consider discharge to [facility] pending stable O2 over weekend - For discharge to [facility], pt would need to have 70% or less FiO2 requirement.   HFpEF Echocardiogram done 06/16/23 showed LVEF 55-60%, elevated left end  diastolic pressure, and IVC dilation suggestive of fluid overload. Pt on torsemide 40 mg daily for diuresis; currently holding Entresto, spironolactone with no plan to restart at discharge. Can consider restarting [name] if renal function stabilizes (previously held last admission with UTI). Goal K>4 and Mag>2. Diuresing with torsemide 40 mg daily. Continue to monitor electrolytes. Gave pt addition IV Lasix 120 mg dose x1 on 8/2 for further diuresis. Pt is euvolemic today, 8/3. Pt to follow-up with cardiology outpatient; appointment scheduled on 07/08/2023 at 8:25am at [facility]. - Continue K Cl 40 mEq daily - Continue torsemide 40 mg daily - Monitor metabolic panels   COPD Pt previously on baseline of 4-5 L O2; currently requiring much higher supplementation. Will continue pt on Pulmicort, Brovana, ipratropium, levalbuterol (increased PVCs on albuterol) while in hospital. Scheduled guaifenesin q4h, nebulized hypertonic NS BID to improve cough. Pt started on increased Pulmicort (0.5 mg BID from 0.25 mg BID), Yupelri nebs, and IV Solu-medrol 40 mg BID by PCCM on 8/2. - CONTINUE Yupelri nebs, IV Solu-medrol 40 mg BID - Pulmicort 0.5 mg BID, Brovana 15 mcg BID, ipratropium 0.5 mg q8h, levalbuterol 0.63 mg q8h - To discharge on triple therapy inhaler  instead of home Dulera - Levalbuterol prn q6h, ipratropium prn q6h - Guaifenesin 10 mL five times a day, nebulized hypertonic NS BID - START Robitussin DM 10 mL daily at bedtime for cough   AKI, non oliguric; improving Potential cardiorenal component of AKI given pt has experienced improved kidney function with diuresis with good urine output. Baseline Cr of 0.9. Elevated Cr and BUN thought to be due to overdiuresis, pt switched from IV Lasix to po torsemide at reduced dose with improvement in Cr and BUN. Pt currently on purewick. - Continue to trend Cr   T2DM Home regimen includes Trulicity, Lantus and Humalog. Has been on Semglee, Novolog, SSI in hospital; initially required increasing insulin needs with elevated fasting glucoses, stabilized after steroids ended. Suspect steroids given for COPD exacerbation contributed to hyperglycemia, now improved given pt off steroids. Will continue to monitor CBGs and adjust insulin accordingly. - Continue Semglee 30 units daily - Continue Novolog 18 units TID - Continue SSI   Nonsustained asymptomatic Vtach Bradycardia with Junctional Rhythm Paroxysmal Atrial Fibrillation with RVR Hx of AV nodal dysfunction. Episode of asymptomatic, nonsustained Vtach 7/25. New onset A fib with RVR this hospitalization, improved with lopressor, pt started on Eliquis for CHADSVASC of 6; etiology of A fib multifactorial. Pt with HRs in the 50s; lopressor held. Pt to follow-up with cardiology outpatient; appointment scheduled on 07/08/2023 at 8:25am at [name]. - Continue telemetry - Eliquis 5 mg BID for stroke prophylaxis - HOLD lopressor; limit/avoid AV nodal blocking agents  Chronic Conditions: Normocytic anemia: Stable. Hypertension: Holding amlodipine 10 mg daily at nephro's rec. Holding Entresto, spironolactone; cardiology does not recommend restarting these medications. Hyperlipidemia, CAD: Continue atorvastatin 80 mg daily, Zetia 10  mg daily, ASA 81 mg daily.  Code Status: Full Diet: Renal/carb modified with fluid restriction ( ) IV Fluid: None DVT Prophylaxis: Eliquis 5 mg BID   Dispo: Pt approved for LTACH [employer]) with insurance auth approved; anticipate discharge to [facility] once O2 needs stable, possible discharge tomorrow (8/4)   LOS: 11 days   Governor Rooks, Medical Student  06/26/2023, 11:07 AM  Attestation for Student Documentation:  I personally was present and re-performed the history, physical exam and medical decision-making activities of this service and have verified that  the service and findings are accurately documented in the student's note.  Modena Slater, DO 06/26/2023, 2:02 PM

## 2023-06-26 NOTE — Plan of Care (Signed)
  Problem: Elimination: Goal: Will not experience complications related to bowel motility Outcome: Progressing Goal: Will not experience complications related to urinary retention Outcome: Progressing   Problem: Safety: Goal: Ability to remain free from injury will improve Outcome: Progressing   Problem: Skin Integrity: Goal: Risk for impaired skin integrity will decrease Outcome: Progressing   

## 2023-06-26 NOTE — Progress Notes (Signed)
Per CCMD, pt had 14 beat run of v tach. Pt asymptomatic. HR back down in 50s - sinus brady. Modena Slater, DO made aware. No new orders at this time. Pt remains on telemetry.

## 2023-06-26 NOTE — Progress Notes (Signed)
Spoke with pt's daughter, Gloriajean Dell, by phone at [phone]. Gave her updates about the pt's status and that her O2 requirements have recently remained stable while she's been in the hospital, though they are increased from her pre-hospital requirements. Also informed daughter about pt's PCCM consult yesterday, and their recommendations re: steroid treatment and additional nebulizer therapy. Informed daughter that pt was started on antibiotics yesterday for possible pneumonia. Daughter asked when pt would be able to transfer to [facility], and informed daughter that, as long as pt remained stable on current O2 requirements without need for increased supplementation, our team was hopeful she could discharge in the next couple days.

## 2023-06-26 NOTE — Plan of Care (Signed)
  Problem: Coping: Goal: Ability to adjust to condition or change in health will improve Outcome: Progressing   Problem: Clinical Measurements: Goal: Will remain free from infection Outcome: Progressing Goal: Diagnostic test results will improve Outcome: Progressing Goal: Respiratory complications will improve Outcome: Progressing Goal: Cardiovascular complication will be avoided Outcome: Progressing   Problem: Activity: Goal: Risk for activity intolerance will decrease Outcome: Progressing

## 2023-06-27 DIAGNOSIS — I5032 Chronic diastolic (congestive) heart failure: Secondary | ICD-10-CM | POA: Diagnosis not present

## 2023-06-27 DIAGNOSIS — J441 Chronic obstructive pulmonary disease with (acute) exacerbation: Secondary | ICD-10-CM | POA: Diagnosis not present

## 2023-06-27 DIAGNOSIS — J9621 Acute and chronic respiratory failure with hypoxia: Secondary | ICD-10-CM | POA: Diagnosis not present

## 2023-06-27 DIAGNOSIS — I110 Hypertensive heart disease with heart failure: Secondary | ICD-10-CM | POA: Diagnosis not present

## 2023-06-27 LAB — GLUCOSE, CAPILLARY
Glucose-Capillary: 199 mg/dL — ABNORMAL HIGH (ref 70–99)
Glucose-Capillary: 198 mg/dL — ABNORMAL HIGH (ref 70–99)
Glucose-Capillary: 215 mg/dL — ABNORMAL HIGH (ref 70–99)
Glucose-Capillary: 159 mg/dL — ABNORMAL HIGH (ref 70–99)

## 2023-06-27 LAB — RENAL FUNCTION PANEL
Albumin: 2.8 g/dL — ABNORMAL LOW (ref 3.5–5.0)
Anion gap: 13 (ref 5–15)
BUN: 96 mg/dL — ABNORMAL HIGH (ref 8–23)
CO2: 29 mmol/L (ref 22–32)
Calcium: 9 mg/dL (ref 8.9–10.3)
Chloride: 88 mmol/L — ABNORMAL LOW (ref 98–111)
Creatinine, Ser: 1.24 mg/dL — ABNORMAL HIGH (ref 0.44–1.00)
GFR, Estimated: 48 mL/min — ABNORMAL LOW (ref >60)
Glucose, Bld: 194 mg/dL — ABNORMAL HIGH (ref 70–99)
Phosphorus: 3.2 mg/dL (ref 2.5–4.6)
Potassium: 4.4 mmol/L (ref 3.5–5.1)
Sodium: 130 mmol/L — ABNORMAL LOW (ref 135–145)

## 2023-06-27 LAB — MAGNESIUM: Magnesium: 2.4 mg/dL (ref 1.7–2.4)

## 2023-06-27 MED ORDER — HYDROXYZINE HCL 25 MG PO TABS
25.0000 mg | ORAL_TABLET | Freq: Four times a day (QID) | ORAL | Status: DC | PRN
  Administered 2023-06-27 – 2023-06-28 (×2): 25 mg via ORAL
  Filled 2023-06-27 (×2): qty 1

## 2023-06-27 MED ORDER — SIMETHICONE 80 MG PO CHEW: 80.0000 mg | CHEWABLE_TABLET | Freq: Four times a day (QID) | ORAL | Status: DC | PRN

## 2023-06-27 MED ORDER — PHENOL 1.4 % MT LIQD: 1.0000 | OROMUCOSAL | Status: DC | PRN

## 2023-06-27 MED ORDER — SENNA 8.6 MG PO TABS
2.0000 | ORAL_TABLET | Freq: Two times a day (BID) | ORAL | Status: DC
  Administered 2023-06-27 – 2023-06-28 (×3): 17.2 mg via ORAL
  Filled 2023-06-27 (×3): qty 2

## 2023-06-27 NOTE — Progress Notes (Signed)
RT took pt off of HHFNC and placed pt on 6L [place]. Pt is tolerating very well at this time. No respiratory distress noted. RT will monitor.

## 2023-06-27 NOTE — Progress Notes (Signed)
HD#12 SUBJECTIVE:  Patient Summary: Alicia [name] is a 67 y.o. female with a PMHx significant for COPD, HFpEF, T2DM, CAD, HTN, GERD who was admitted for acute hypoxic respiratory failure likely multifactorial from acute on chronic HFpEF exacerbation and acute COPD exacerbation. Pt remains hospitalized for continued respiratory therapy with O2 supplementation.   Overnight Events: None  Interim History: Pt was examined at bedside today. She reports she feels great today. She didn't have to use BiPAP last night. She's currently on HFNC at 25 L/min and FiO2 of 53%. She reports eating chicken salad last night that caused her abdominal bloating. She has been passing flatus. She also reports sensation of food getting stuck in her throat. She is having trouble swallowing, says it feels horse. Otherwise, she denies chest pain. Says her breathing is better.     OBJECTIVE:  Vital Signs: Vitals:   06/27/23 0100 06/27/23 0530 06/27/23 0823 06/27/23 1220  BP: (!) 162/64 (!) 157/67 (!) 161/61   Pulse:  (!) 52    Resp:  17    Temp: 97.8 F (36.6 C) 97.9 F (36.6 C) 98.5 F (36.9 C)   TempSrc: Oral Oral Oral Oral  SpO2: 97% 97%    Weight:  95.1 kg    Height:       Supplemental O2: HFNC SpO2: 97 % O2 Flow Rate (L/min): (S) 20 L/min FiO2 (%): (S) 40 %  Filed Weights   06/25/23 0433 06/26/23 0524 06/27/23 0530  Weight: 97 kg 95 kg 95.1 kg     Intake/Output Summary (Last 24 hours) at 06/27/2023 1337 Last data filed at 06/27/2023 1102 Gross per 24 hour  Intake 780 ml  Output 1725 ml  Net -945 ml   Net IO Since Admission: -12,333.47 mL [06/27/23 1337]  Physical Exam: Physical Exam  General: Pt is lying down in hospital bed with head of bed elevated, appears comfortable, no acute distress.  Cardiovascular: RRR, no murmurs, rubs, gallops. Pulmonary: HFNC at 25 L/min and FiO2 of 53%. Normal work of breathing; +expiratory wheeze and course crackles RLL Extremities: No BLE edema; warm,  dry. Neuro: no focal deficits. Psych: Normal mood and affect  Patient Lines/Drains/Airways Status     Active Line/Drains/Airways     Name Placement date Placement time Site Days   Peripheral IV 06/23/23 22 G Anterior;Left Forearm 06/23/23  0940  Forearm  4   External Urinary Catheter 06/24/23  2041  --  3   Incision - 4 Ports Abdomen 1: Umbilicus 2: Mid;Upper 3: Right;Upper 4: Right;Lateral 08/02/17  1150  -- 2155             ASSESSMENT/PLAN:  Assessment: Principal Problem:   Acute on chronic hypoxic respiratory failure (HCC) Active Problems:   COPD (chronic obstructive pulmonary disease) (HCC)   Chronic respiratory failure with hypoxia, on home oxygen therapy (HCC)   AKI (acute kidney injury) (HCC)   Acute decompensated heart failure (HCC)   Acute on chronic heart failure with preserved ejection fraction (HCC)   Diastolic CHF, chronic (HCC)   Acute respiratory failure with hypoxia (HCC)   Acute heart failure with preserved ejection fraction (HFpEF) (HCC)   Plan: Alicia [name] is a 67 y.o. female with a PMHx significant for COPD, HFpEF, T2DM, CAD, HTN, GERD who was admitted for acute hypoxic respiratory failure likely multifactorial from acute on chronic HFpEF exacerbation and acute COPD exacerbation. Pt remains hospitalized for continued respiratory therapy with O2 supplementation.   Acute on Chronic hypoxic respiratory failure  2/2 COPD exacerbation, Acute on chronic HFpEF exacerbation Acute hypoxic respiratory failure secondary to HFpEF exacerbation and COPD exacerbation with suspected viral URI trigger. Pt initially weaned from BiPAP to HFNC over course of hospitalization with lowest O2 requirements of 8L via HFNC on 7/26. However, following this pt has been having increased O2 needs with continued need for BiPAP at nights and occasionally during the day. Overnight on 8/1, pt was placed on HFNC with FiO2 of 90%-100% by nightshift RT; there was no documented reason of pt  decompensation or desaturation. Upon assessment of this pt the following morning on 8/2, she was stable on HFNC at 15 L/min at FiO2 of 60%. She has not required FiO2 of greater than 70% following this. We did pursue workup of the increased FiO2 with repeat CXR done 8/2 showing stable bibasilar opacities as compared to CXR done 7/31. Pt started on IV Vancomycin and Cefepime over concern of possible HAP on 8/2. Pt seen by PCCM on 8/2 and did not believe pt needed ICU-level care at this time; they recommended increasing Pulmicort dose from 0.25 mg BID to 0.5 mg BID. They also recommended starting her on IV Solu-medrol 40 mg BID and Yupelri nebs in addition to her other breathing treatments. Will schedule nighttime cough medication given she's had nighttime cough consistently throughout hospitalization. Will continue to encourage pt to use spirometry and spend time out of bed in upright position.   Unclear whether pt's increased FiO2 on 8/1 was a true need resulting from decompensation. Pt has been doing well on HFNC at FiO2s 70% and lower for past 24 hours; will continue to monitor for an additional 24 hours before considering discharge to [facility]. If pt is able to maintain good O2 saturation, no higher than 70% FiO2, and without desatting to the point of requiring ICU transfer, can discharge pt to [facility]. Pt's LTACH Auth is approved through 07/07/23.   Patient was medically stable to be discharged to [facility] today, however it seems patient was unable to be transferred to [facility] today, because it is the weekend. Will plan for tomorrow, 8/5 on Monday.    - Supplementing Mag with IV 2g once - Continue torsemide 40 mg daily - Continue O2 supplementation with HFNC during daytime, BiPAP prn at night - Guaifenesin 10 mL five times a day, nebulized hypertonic NS BID - START Robitussin DM 10 mL daily at bedtime for cough - START Chloraseptic mouth spray for hoarseness - START Mylicon for abdominal bloating  - Continue to  work with PT/OT; pt to get out of bed for meals (TID) as tolerated - Continue incentive spirometry - Continue to follow with chaplain - IV Vanc 1000 mg daily + IV cefepime 2 g BID; Day 3 - Consider discharge to [facility] pending stable O2 over weekend - For discharge to [facility], pt would need to have 70% or less FiO2 requirement.   HFpEF Echocardiogram done 06/16/23 showed LVEF 55-60%, elevated left end diastolic pressure, and IVC dilation suggestive of fluid overload. Pt on torsemide 40 mg daily for diuresis; currently holding Entresto, spironolactone with no plan to restart at discharge. Can consider restarting [name] if renal function stabilizes (previously held last admission with UTI). Goal K>4 and Mag>2. Diuresing with torsemide 40 mg daily. Continue to monitor electrolytes. Gave pt addition IV Lasix 120 mg dose x1 on 8/2 for further diuresis. Pt is euvolemic today, 8/3. Pt to follow-up with cardiology outpatient; appointment scheduled on 07/08/2023 at 8:25am at [facility] at [facility]. -  Continue K Cl 40 mEq daily - Continue torsemide 40 mg daily - Monitor metabolic panels   COPD Pt previously on baseline of 4-5 L O2; currently requiring much higher supplementation. Will continue pt on Pulmicort, Brovana, ipratropium, levalbuterol (increased PVCs on albuterol) while in hospital. Scheduled guaifenesin q4h, nebulized hypertonic NS BID to improve cough. Pt started on increased Pulmicort (0.5 mg BID from 0.25 mg BID), Yupelri nebs, and IV Solu-medrol 40 mg BID by PCCM on 8/2. - CONTINUE Yupelri nebs, IV Solu-medrol 40 mg BID - Pulmicort 0.5 mg BID, Brovana 15 mcg BID, ipratropium 0.5 mg q8h, levalbuterol 0.63 mg q8h - To discharge on triple therapy inhaler instead of home Dulera - Levalbuterol prn q6h, ipratropium prn q6h - Guaifenesin 10 mL five times a day, nebulized hypertonic NS BID - START Robitussin DM 10 mL daily at bedtime for cough   AKI, non oliguric; improving Potential  cardiorenal component of AKI given pt has experienced improved kidney function with diuresis with good urine output. Baseline Cr of 0.9. Elevated Cr and BUN thought to be due to overdiuresis, pt switched from IV Lasix to po torsemide at reduced dose with improvement in Cr and BUN. Pt currently on purewick. - Continue to trend Cr   T2DM Home regimen includes Trulicity, Lantus and Humalog. Has been on Semglee, Novolog, SSI in hospital; initially required increasing insulin needs with elevated fasting glucoses, stabilized after steroids ended. Suspect steroids given for COPD exacerbation contributed to hyperglycemia, now improved given pt off steroids. Will continue to monitor CBGs and adjust insulin accordingly. - Continue Semglee 30 units daily - Continue Novolog 18 units TID - Continue SSI   Nonsustained asymptomatic Vtach Bradycardia with Junctional Rhythm Paroxysmal Atrial Fibrillation with RVR Hx of AV nodal dysfunction. Episode of asymptomatic, nonsustained Vtach 7/25. New onset A fib with RVR this hospitalization, improved with lopressor, pt started on Eliquis for CHADSVASC of 6; etiology of A fib multifactorial. Pt with HRs in the 50s; lopressor held. Pt to follow-up with cardiology outpatient; appointment scheduled on 07/08/2023 at 8:25am at [facility]. - Continue telemetry - Eliquis 5 mg BID for stroke prophylaxis - HOLD lopressor; limit/avoid AV nodal blocking agents   Chronic Conditions: Normocytic anemia: Stable. Hypertension: Holding amlodipine 10 mg daily at nephro's rec. Holding Entresto, spironolactone; cardiology does not recommend restarting these medications. Hyperlipidemia, CAD: Continue atorvastatin 80 mg daily, Zetia 10 mg daily, ASA 81 mg daily.   Code Status: Full Diet: Renal/carb modified with fluid restriction ( ) IV Fluid: None DVT Prophylaxis: Eliquis 5 mg BID   Dispo: Pt approved for LTACH [employer]) with insurance auth approved;  anticipate discharge to [facility] once O2 needs stable, possible discharge tomorrow (8/5)    LOS: 12 days   Signature: Jeral Pinch, D.O.  Internal Medicine Resident, PGY-1 [facility]  Pager: [phone] 1:37 PM, 06/27/2023   Please contact the on call pager after 5 pm and on weekends at [phone].

## 2023-06-27 NOTE — Plan of Care (Signed)
  Problem: Education: Goal: Ability to describe self-care measures that may prevent or decrease complications (Diabetes Survival Skills Education) will improve Outcome: Progressing   Problem: Coping: Goal: Ability to adjust to condition or change in health will improve Outcome: Progressing   Problem: Fluid Volume: Goal: Ability to maintain a balanced intake and output will improve Outcome: Progressing   Problem: Health Behavior/Discharge Planning: Goal: Ability to identify and utilize available resources and services will improve Outcome: Progressing   Problem: Health Behavior/Discharge Planning: Goal: Ability to manage health-related needs will improve Outcome: Progressing   Problem: Metabolic: Goal: Ability to maintain appropriate glucose levels will improve Outcome: Progressing

## 2023-06-28 ENCOUNTER — Other Ambulatory Visit (HOSPITAL_COMMUNITY): Payer: Self-pay

## 2023-06-28 DIAGNOSIS — Z87891 Personal history of nicotine dependence: Secondary | ICD-10-CM | POA: Diagnosis not present

## 2023-06-28 DIAGNOSIS — I5033 Acute on chronic diastolic (congestive) heart failure: Secondary | ICD-10-CM | POA: Diagnosis not present

## 2023-06-28 DIAGNOSIS — J441 Chronic obstructive pulmonary disease with (acute) exacerbation: Secondary | ICD-10-CM

## 2023-06-28 DIAGNOSIS — J9621 Acute and chronic respiratory failure with hypoxia: Secondary | ICD-10-CM | POA: Diagnosis not present

## 2023-06-28 LAB — GLUCOSE, CAPILLARY
Glucose-Capillary: 201 mg/dL — ABNORMAL HIGH (ref 70–99)
Glucose-Capillary: 212 mg/dL — ABNORMAL HIGH (ref 70–99)
Glucose-Capillary: 280 mg/dL — ABNORMAL HIGH (ref 70–99)

## 2023-06-28 MED ORDER — PREDNISONE 20 MG PO TABS: 40.0000 mg | ORAL_TABLET | Freq: Every day | ORAL | Status: DC

## 2023-06-28 MED ORDER — HYDROXYZINE HCL 25 MG PO TABS
25.0000 mg | ORAL_TABLET | Freq: Four times a day (QID) | ORAL | 0 refills | Status: AC | PRN
  Filled 2023-06-28: qty 30, 8d supply, fill #0

## 2023-06-28 MED ORDER — APIXABAN 5 MG PO TABS
5.0000 mg | ORAL_TABLET | Freq: Two times a day (BID) | ORAL | 0 refills | Status: DC
  Filled 2023-06-28: qty 60, 30d supply, fill #0

## 2023-06-28 MED ORDER — PREDNISONE 20 MG PO TABS
20.0000 mg | ORAL_TABLET | Freq: Every day | ORAL | 0 refills | Status: AC
  Filled 2023-06-28: qty 5, 5d supply, fill #0

## 2023-06-28 MED ORDER — INSULIN ASPART 100 UNIT/ML IJ SOLN: 18.0000 [IU] | Freq: Three times a day (TID) | INTRAMUSCULAR | Status: DC

## 2023-06-28 MED ORDER — TRELEGY ELLIPTA 100-62.5-25 MCG/ACT IN AEPB
1.0000 | INHALATION_SPRAY | Freq: Every day | RESPIRATORY_TRACT | 0 refills | Status: DC
  Filled 2023-06-28: qty 60, 30d supply, fill #0

## 2023-06-28 MED ORDER — SACUBITRIL-VALSARTAN 24-26 MG PO TABS: 1.0000 | ORAL_TABLET | Freq: Two times a day (BID) | ORAL | Status: DC

## 2023-06-28 MED ORDER — PREDNISONE 10 MG PO TABS: 10.0000 mg | ORAL_TABLET | Freq: Every day | ORAL | Status: DC

## 2023-06-28 MED ORDER — INSULIN GLARGINE-YFGN 100 UNIT/ML ~~LOC~~ SOLN: 30.0000 [IU] | Freq: Every day | SUBCUTANEOUS | Status: DC

## 2023-06-28 MED ORDER — INSULIN GLARGINE 100 UNIT/ML SOLOSTAR PEN
30.0000 [IU] | PEN_INJECTOR | Freq: Every day | SUBCUTANEOUS | 3 refills | Status: DC
Start: 2023-06-28 — End: 2023-08-17
  Filled 2023-06-28: qty 9, 30d supply, fill #0

## 2023-06-28 MED ORDER — BUDESONIDE 0.5 MG/2ML IN SUSP: 0.5000 mg | Freq: Two times a day (BID) | RESPIRATORY_TRACT | Status: DC

## 2023-06-28 MED ORDER — PREDNISONE 20 MG PO TABS
40.0000 mg | ORAL_TABLET | Freq: Every day | ORAL | Status: DC
Start: 2023-06-29 — End: 2023-06-28

## 2023-06-28 MED ORDER — TORSEMIDE 20 MG PO TABS
40.0000 mg | ORAL_TABLET | Freq: Every day | ORAL | 0 refills | Status: DC
  Filled 2023-06-28: qty 60, 30d supply, fill #0

## 2023-06-28 MED ORDER — MUPIROCIN 2 % EX OINT: 1.0000 | TOPICAL_OINTMENT | Freq: Two times a day (BID) | CUTANEOUS | Status: DC

## 2023-06-28 MED ORDER — SACUBITRIL-VALSARTAN 24-26 MG PO TABS
1.0000 | ORAL_TABLET | Freq: Two times a day (BID) | ORAL | 0 refills | Status: DC
  Filled 2023-06-28: qty 60, 30d supply, fill #0

## 2023-06-28 MED ORDER — HYDROXYZINE HCL 25 MG PO TABS: 25.0000 mg | ORAL_TABLET | Freq: Four times a day (QID) | ORAL | Status: DC | PRN

## 2023-06-28 MED ORDER — IPRATROPIUM BROMIDE 0.02 % IN SOLN: 0.5000 mg | Freq: Four times a day (QID) | RESPIRATORY_TRACT | Status: DC | PRN

## 2023-06-28 MED ORDER — PREDNISONE 20 MG PO TABS
40.0000 mg | ORAL_TABLET | Freq: Every day | ORAL | 0 refills | Status: DC
Start: 2023-06-29 — End: 2023-07-08
  Filled 2023-06-28: qty 15, 10d supply, fill #0

## 2023-06-28 MED ORDER — GUAIFENESIN-DM 100-10 MG/5ML PO SYRP: 10.0000 mL | ORAL_SOLUTION | Freq: Every day | ORAL | Status: DC

## 2023-06-28 MED ORDER — LEVALBUTEROL HCL 0.63 MG/3ML IN NEBU: 0.6300 mg | INHALATION_SOLUTION | Freq: Four times a day (QID) | RESPIRATORY_TRACT | Status: DC | PRN

## 2023-06-28 MED ORDER — VANCOMYCIN HCL IN DEXTROSE 1-5 GM/200ML-% IV SOLN: 1000.0000 mg | INTRAVENOUS | Status: DC

## 2023-06-28 MED ORDER — PREDNISONE 20 MG PO TABS: 20.0000 mg | ORAL_TABLET | Freq: Every day | ORAL | Status: DC

## 2023-06-28 MED ORDER — LEVOFLOXACIN 750 MG PO TABS
750.0000 mg | ORAL_TABLET | Freq: Every day | ORAL | 0 refills | Status: AC
  Filled 2023-06-28: qty 7, 7d supply, fill #0

## 2023-06-28 MED ORDER — ONDANSETRON HCL 4 MG/2ML IJ SOLN: 4.0000 mg | Freq: Four times a day (QID) | INTRAMUSCULAR | Status: DC | PRN

## 2023-06-28 MED ORDER — GUAIFENESIN 100 MG/5ML PO LIQD: 10.0000 mL | Freq: Four times a day (QID) | ORAL | Status: DC | PRN

## 2023-06-28 MED ORDER — MUPIROCIN 2 % EX OINT
1.0000 | TOPICAL_OINTMENT | Freq: Two times a day (BID) | CUTANEOUS | 0 refills | Status: AC
  Filled 2023-06-28: qty 22, 11d supply, fill #0

## 2023-06-28 MED ORDER — SODIUM CHLORIDE 3 % IN NEBU: 4.0000 mL | INHALATION_SOLUTION | Freq: Two times a day (BID) | RESPIRATORY_TRACT | Status: DC

## 2023-06-28 MED ORDER — SIMETHICONE 80 MG PO CHEW: 80.0000 mg | CHEWABLE_TABLET | Freq: Four times a day (QID) | ORAL | Status: DC | PRN

## 2023-06-28 MED ORDER — APIXABAN 5 MG PO TABS: 5.0000 mg | ORAL_TABLET | Freq: Two times a day (BID) | ORAL | Status: DC

## 2023-06-28 MED ORDER — CHLORHEXIDINE GLUCONATE CLOTH 2 % EX PADS: 6.0000 | MEDICATED_PAD | Freq: Every day | CUTANEOUS | Status: DC

## 2023-06-28 MED ORDER — MENTHOL 3 MG MT LOZG: 1.0000 | LOZENGE | OROMUCOSAL | Status: DC | PRN

## 2023-06-28 MED ORDER — SODIUM CHLORIDE 0.9 % IV SOLN: 2.0000 g | Freq: Two times a day (BID) | INTRAVENOUS | Status: DC

## 2023-06-28 MED ORDER — ARFORMOTEROL TARTRATE 15 MCG/2ML IN NEBU: 15.0000 ug | INHALATION_SOLUTION | Freq: Two times a day (BID) | RESPIRATORY_TRACT | Status: DC

## 2023-06-28 MED ORDER — REVEFENACIN 175 MCG/3ML IN SOLN: 175.0000 ug | Freq: Every day | RESPIRATORY_TRACT | Status: DC

## 2023-06-28 MED ORDER — PHENOL 1.4 % MT LIQD: 1.0000 | OROMUCOSAL | Status: DC | PRN

## 2023-06-28 MED ORDER — SACUBITRIL-VALSARTAN 24-26 MG PO TABS
1.0000 | ORAL_TABLET | Freq: Two times a day (BID) | ORAL | Status: DC
  Administered 2023-06-28: 1 via ORAL
  Filled 2023-06-28: qty 1

## 2023-06-28 MED ORDER — ONDANSETRON HCL 4 MG PO TABS
4.0000 mg | ORAL_TABLET | Freq: Every day | ORAL | 0 refills | Status: DC | PRN
  Filled 2023-06-28: qty 30, 30d supply, fill #0

## 2023-06-28 MED ORDER — PHENOL 1.4 % MT LIQD
1.0000 | OROMUCOSAL | 0 refills | Status: DC | PRN
Start: 2023-06-28 — End: 2023-07-28
  Filled 2023-06-28: qty 177, 30d supply, fill #0

## 2023-06-28 MED ORDER — SIMETHICONE 80 MG PO CHEW
80.0000 mg | CHEWABLE_TABLET | Freq: Four times a day (QID) | ORAL | 0 refills | Status: DC | PRN
  Filled 2023-06-28: qty 30, 8d supply, fill #0

## 2023-06-28 MED ORDER — TORSEMIDE 40 MG PO TABS: 40.0000 mg | ORAL_TABLET | Freq: Every day | ORAL | Status: DC

## 2023-06-28 NOTE — Plan of Care (Signed)
  Problem: Education: Goal: Ability to describe self-care measures that may prevent or decrease complications (Diabetes Survival Skills Education) will improve Outcome: Adequate for Discharge   Problem: Coping: Goal: Ability to adjust to condition or change in health will improve Outcome: Adequate for Discharge   Problem: Fluid Volume: Goal: Ability to maintain a balanced intake and output will improve Outcome: Adequate for Discharge   Problem: Health Behavior/Discharge Planning: Goal: Ability to identify and utilize available resources and services will improve Outcome: Adequate for Discharge Goal: Ability to manage health-related needs will improve Outcome: Adequate for Discharge   Problem: Metabolic: Goal: Ability to maintain appropriate glucose levels will improve Outcome: Adequate for Discharge   Problem: Nutritional: Goal: Maintenance of adequate nutrition will improve Outcome: Adequate for Discharge Goal: Progress toward achieving an optimal weight will improve Outcome: Adequate for Discharge   Problem: Skin Integrity: Goal: Risk for impaired skin integrity will decrease Outcome: Adequate for Discharge   Problem: Tissue Perfusion: Goal: Adequacy of tissue perfusion will improve Outcome: Adequate for Discharge   Problem: Education: Goal: Knowledge of General Education information will improve Description: Including pain rating scale, medication(s)/side effects and non-pharmacologic comfort measures Outcome: Adequate for Discharge   Problem: Health Behavior/Discharge Planning: Goal: Ability to manage health-related needs will improve Outcome: Adequate for Discharge   Problem: Clinical Measurements: Goal: Ability to maintain clinical measurements within normal limits will improve Outcome: Adequate for Discharge Goal: Will remain free from infection Outcome: Adequate for Discharge Goal: Diagnostic test results will improve Outcome: Adequate for Discharge Goal:  Respiratory complications will improve Outcome: Adequate for Discharge Goal: Cardiovascular complication will be avoided Outcome: Adequate for Discharge   Problem: Activity: Goal: Risk for activity intolerance will decrease Outcome: Adequate for Discharge   Problem: Coping: Goal: Level of anxiety will decrease Outcome: Adequate for Discharge   Problem: Elimination: Goal: Will not experience complications related to bowel motility Outcome: Adequate for Discharge   Problem: Safety: Goal: Ability to remain free from injury will improve Outcome: Adequate for Discharge   Problem: Skin Integrity: Goal: Risk for impaired skin integrity will decrease Outcome: Adequate for Discharge

## 2023-06-28 NOTE — Plan of Care (Signed)
  Problem: Elimination: Goal: Will not experience complications related to urinary retention Outcome: Completed/Met

## 2023-06-28 NOTE — Progress Notes (Signed)
Physical Therapy Treatment Patient Details Name: Alicia [name] MRN: [id number] DOB: 1956/09/04 Today's Date: 06/28/2023   History of Present Illness 67 y.o. female presents to [facility] hospital on 06/15/2023 with SOB, cough and ear pain. Pt required BiPAP in the ED due to hypoxia. Pt admitted for CHF and COPD exacerbation 2/2 URI. 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    PT Comments  Pt on 4LO2 upon arrival to room, and is eager to progress mobility OOB. Pt ambulated good hallway distance x2, needing intermittent standing and sitting rest breaks to recover fatigue. Pt maintained SPO2 89% and greater on 4LO2 when cued for pursed lip breathing technique and rest breaks as needed. Pt eager to d/c home, but pt aware of barriers to d/c home (previously high O2 needs, IV antibiotics). PT to continue to follow while acute.    If plan is discharge home, recommend the following: Assist for transportation;Help with stairs or ramp for entrance;A little help with walking and/or transfers;A little help with bathing/dressing/bathroom;Assistance with cooking/housework   Can travel by private vehicle        Equipment Recommendations  None recommended by PT    Recommendations for Other Services       Precautions / Restrictions Precautions Precautions: Fall Precaution Comments: monitor HR and sats, 4LO2 via HFNC SPO2 89-100% throughout Restrictions Weight Bearing Restrictions: No     Mobility  Bed Mobility Overal bed mobility: Needs Assistance Bed Mobility: Supine to Sit, Sit to Supine     Supine to sit: Min assist Sit to supine: Supervision   General bed mobility comments: trunk rise assist via HHA    Transfers Overall transfer level: Needs assistance Equipment used: Rollator (4 wheels) Transfers: Sit to/from Stand Sit to Stand: Min assist           General transfer comment: light rise and steady assist  initially, transitioned to close guard on second and third stands    Ambulation/Gait Ambulation/Gait assistance: Min guard Gait Distance (Feet): 100 Feet Assistive device: Rollator (4 wheels) Gait Pattern/deviations: Step-through pattern, Decreased stride length, Trunk flexed Gait velocity: decr     General Gait Details: cues for hallway navigation and rollator use, especially locking/unlocking with seated rest breaks. SPO2 89-100% on 4LO2, cues for breathing technique and rest as needed   Stairs             Wheelchair Mobility     Tilt Bed    Modified Rankin (Stroke Patients Only)       Balance Overall balance assessment: Needs assistance Sitting-balance support: Bilateral upper extremity supported Sitting balance-Leahy Scale: Good     Standing balance support: Bilateral upper extremity supported, During functional activity, Reliant on assistive device for balance Standing balance-Leahy Scale: Poor Standing balance comment: reliant on UE support when standing                            Cognition Arousal/Alertness: Awake/alert Behavior During Therapy: WFL for tasks assessed/performed Overall Cognitive Status: Within Functional Limits for tasks assessed                                 General Comments: lacks some insight into deficits        Exercises      General Comments  Pertinent Vitals/Pain Pain Assessment Pain Assessment: Faces Faces Pain Scale: Hurts little more Pain Location: back Pain Descriptors / Indicators: Discomfort, Grimacing Pain Intervention(s): Limited activity within patient's tolerance, Monitored during session, Repositioned    Home Living                          Prior Function            PT Goals (current goals can now be found in the care plan section) Acute Rehab PT Goals Patient Stated Goal: to get her strength back PT Goal Formulation: With patient Time For Goal  Achievement: 07/01/23 Potential to Achieve Goals: Good Progress towards PT goals: Progressing toward goals    Frequency    Min 1X/week      PT Plan Current plan remains appropriate    Co-evaluation              AM-PAC PT "6 Clicks" Mobility   Outcome Measure  Help needed turning from your back to your side while in a flat bed without using bedrails?: A Little Help needed moving from lying on your back to sitting on the side of a flat bed without using bedrails?: A Little Help needed moving to and from a bed to a chair (including a wheelchair)?: A Little Help needed standing up from a chair using your arms (e.g., wheelchair or bedside chair)?: A Little Help needed to walk in hospital room?: A Little Help needed climbing 3-5 steps with a railing? : A Little 6 Click Score: 18    End of Session Equipment Utilized During Treatment: Oxygen Activity Tolerance: Patient limited by fatigue Patient left: with call bell/phone within reach;in bed;with bed alarm set Nurse Communication: Mobility status PT Visit Diagnosis: Muscle weakness (generalized) (M62.81);Difficulty in walking, not elsewhere classified (R26.2)     Time: 2440-1027 PT Time Calculation (min) (ACUTE ONLY): 50 min  Charges:    $Gait Training: 8-22 mins $Therapeutic Activity: 23-37 mins PT General Charges $$ ACUTE PT VISIT: 1 Visit                     Marye Round, PT DPT Acute Rehabilitation Services Secure Chat Preferred  Office [phone]     E Christain [place] 06/28/2023, 2:17 PM

## 2023-06-28 NOTE — TOC Transition Note (Addendum)
Transition of Care [facility]) - CM/SW Discharge Note   Patient Details  Name: Alicia [name] MRN: [id number] Date of Birth: June 12, 1956  Transition of Care [facility]) CM/SW Contact:  Leone Haven, RN Phone Number: 06/28/2023, 1:53 PM   Clinical Narrative:    Attempted Risk for Readmit.  Patient will dc to [facility] today.  Per MD patient has stated she wants to go home, per physical therapy she did well with them.  She is set up with [facility] for HHRN, HHPT, HHOT.     Final next level of care: Home w Home Health Services Barriers to Discharge: Continued Medical Work up   Patient Goals and CMS Choice CMS Medicare.gov Compare Post Acute Care list provided to:: Patient Choice offered to / list presented to : Patient  Discharge Placement                         Discharge Plan and Services Additional resources added to the After Visit Summary for   In-house Referral: NA Discharge Planning Services: CM Consult Post Acute Care Choice: Home Health, Durable Medical Equipment          DME Arranged: Walker rolling with seat DME Agency: [employer] Date DME Agency Contacted: 06/18/23 Time DME Agency Contacted: [phone] Representative spoke with at DME Agency: Vaughan Basta HH Arranged: RN, PT, OT HH Agency: [facility] Date [facility] Agency Contacted: 06/18/23 Time HH Agency Contacted: [phone] Representative spoke with at [facility] Agency: Kandee Keen  Social Determinants of Health (SDOH) Interventions SDOH Screenings   Food Insecurity: No Food Insecurity (06/18/2023)  Housing: Low Risk  (06/18/2023)  Transportation Needs: No Transportation Needs (06/18/2023)  Utilities: Not At Risk (06/18/2023)  Alcohol Screen: Low Risk  (06/18/2023)  Depression (PHQ2-9): Low Risk  (05/25/2023)  Financial Resource Strain: Low Risk  (06/18/2023)  Physical Activity: Inactive (05/19/2023)  Social Connections: Socially Isolated (05/19/2023)  Stress: No Stress Concern Present (05/19/2023)  Tobacco Use: Medium  Risk (06/15/2023)     Readmission Risk Interventions     No data to display

## 2023-06-28 NOTE — Progress Notes (Signed)
IV and tele discontinued. Pt given AVS and medications from pharmacy., Pt educated on follow up apts, meds, and discharge instructions. Rolling walker with pt, Shower bench will be delivered to her home. Deny any questions or concerns at this time. Pt wheelchair off unit.

## 2023-06-28 NOTE — Progress Notes (Signed)
Pharmacy Antibiotic Note  Alicia [name] is a 67 y.o. female admitted on 06/15/2023 with pneumonia.  Pharmacy has been consulted for vanc/cefepime dosing.  Plan: Continue Vancomycin 1000 mg IV every 24 hours.  eAUC ~415 mcg*hr/mL (Scr 1.20- improved). End date: 07/01/2023 Continue Cefepime 2 grams iv q12h End date: 07/01/2023  Height: 5\' 7"  (170.2 cm) Weight: 95.5 kg (210 lb 8 oz) IBW/kg (Calculated) : 61.6  Temp (24hrs), Avg:97.9 F (36.6 C), Min:97.7 F (36.5 C), Max:98 F (36.7 C)  Recent Labs  Lab 06/23/23 1004 06/23/23 1322 06/24/23 0328 06/25/23 0021 06/26/23 0034 06/27/23 0026 06/28/23 0219  WBC 12.4*  --  10.7* 10.8* 11.6*  --  18.1*  CREATININE  --    < > 1.56* 1.57* 1.26* 1.24* 1.20*   < > = values in this interval not displayed.    Estimated Creatinine Clearance: 55.5 mL/min (A) (by C-G formula based on SCr of 1.2 mg/dL (H)).    Allergies  Allergen Reactions   Canagliflozin Itching, Anxiety and Palpitations    Antimicrobials this admission: Vanc 8/2 >> [8/8] Cefepime 8/2 >> [8/8]  Microbiology results: 8/2 MRSA PCR: +  Thank you for allowing pharmacy to be a part of this patient's care.  Greta Doom BS, PharmD, BCPS Clinical Pharmacist 06/28/2023 10:25 AM  Contact: [phone] after 3 PM  "Be curious, not judgmental..." -Debbora Dus

## 2023-06-28 NOTE — Progress Notes (Signed)
   Heart Failure Stewardship Pharmacist Progress Note   PCP: [name], MD PCP-Cardiologist: Donato Schultz, MD    HPI:  67 yo F with PMH of COPD, oxygen dependent, CHF (EF 20-25% in 2015, recovered to 55-60%), CAD, HLD, GERD, T2DM, and CVA.    Admitted in Nov 2023 with acute on chronic CHF exacerbation. Diuresed w/ IV Lasix and treated w/ abx, steroids and nebs. Echo showed normal EF 55-60% and normal RV. After diureses, was transitioned to PO Lasix. Also placed on Entresto.   She was seen in HF TOC 10/2022. Jardiance increased to 10 mg daily and spironlactone added. She was seen back in [facility] 11/2022 and spironolactone was increased to 25 mg daily. She was later seen at [facility] on 1/30 and lasix was decreased to 80 mg AM and 40 mg PM.  Admitted 01/2023 with acute on chronic CHF exacerbation. ECHO 3/17 showed LVEF 60-65%. Diuresed with IV lasix 120 mg, metolazone, and diamox. Discharged on Eentresto, spironolactone, amlodipine, and lasix. [name] held due to UTI.   Presented to the ED on 7/23 with increasing O2 needs and desaturations. CXR showed pulmonary congestion. BNP elevated. ECHO 7/24 showed LVEF 50-60%, G3DD, no RWMA, RV normal, moderately elevated PA pressure.   Current HF Medications: Diuretic: torsemide 40 mg daily  Prior to admission HF Medications: Diuretic: torsemide 60 mg daily ACE/ARB/ARNI: Entresto 97/103 mg BID MRA: spironolactone 25 mg daily  Pertinent Lab Values: Serum creatinine 1.20, BUN 86, Potassium 4.6, Sodium 129, BNP 3750.1, Magnesium 2.4, A1c 8.2   Vital Signs: Weight: 210 lbs (admission weight: 221 lbs) Blood pressure: 170/60s  Heart rate: 50s  I/O: net -12.8L since admission  Medication Assistance / Insurance Benefits Check: Does the patient have prescription insurance?  Yes Type of insurance plan: [place] Medicaid  Outpatient Pharmacy:  Prior to admission outpatient pharmacy: [facility] Is the patient willing to use [facility] TOC pharmacy at discharge?  Yes Is the patient willing to transition their outpatient pharmacy to utilize a [facility] outpatient pharmacy?   No    Assessment: 1. 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 NYHA class II symptoms. - Continue torsemide 40 mg daily. Strict I/Os and daily weights. Keep K>4 and Mg>2. KCl 40 mEq daily ordered for supplementation, consider holding since K has been 4.4-4.8 range on torsemide.  - Holding metoprolol. History of bradycardia.  - Holding Entresto and spironolactone with AKI. Creatinine back to baseline. Consider restarting Entresto.  - Held Aleneva last admission with UTI - consider restarting this admission if renal function stabillizes  Plan: 1) Medication changes recommended at this time: - Stop KCl - Start Entresto at 24/26 mg BID  2) Patient assistance: - None pending, has [place] Medicaid   3)  Education  - Patient has been educated on current HF medications and potential additions to HF medication regimen - Patient verbalizes understanding that over the next few months, these medication doses may change and more medications may be added to optimize HF regimen - Patient has been educated on basic disease state pathophysiology and goals of therapy   Sharen Hones, PharmD, BCPS Heart Failure Stewardship Pharmacist Phone [phone]

## 2023-06-28 NOTE — Discharge Summary (Addendum)
Name: Alicia [name] MRN: [id number] DOB: 1956/01/28 67 y.o. PCP: [name], MD  Date of Admission: 06/15/2023  4:26 PM Date of Discharge:  06/28/2023 Attending Physician: Dr.  Lafonda Mosses  DISCHARGE DIAGNOSIS:  Primary Problem: Acute on chronic hypoxic respiratory failure [facility])   Hospital Problems: Principal Problem:   Acute on chronic hypoxic respiratory failure (HCC) Active Problems:   COPD (chronic obstructive pulmonary disease) (HCC)   Chronic respiratory failure with hypoxia, on home oxygen therapy (HCC)   AKI (acute kidney injury) (HCC)   Acute decompensated heart failure (HCC)   Acute on chronic heart failure with preserved ejection fraction (HCC)   Diastolic CHF, chronic (HCC)   Acute respiratory failure with hypoxia (HCC)   Acute heart failure with preserved ejection fraction (HFpEF) (HCC)   COPD exacerbation (HCC)    DISCHARGE MEDICATIONS:   Allergies as of 06/28/2023       Reactions   Canagliflozin Itching, Anxiety, Palpitations        Medication List     STOP taking these medications    albuterol 108 (90 Base) MCG/ACT inhaler Commonly known as: VENTOLIN HFA   aspirin 81 MG tablet   Entresto 97-103 MG Generic drug: sacubitril-valsartan Replaced by: sacubitril-valsartan 24-26 MG   mometasone-formoterol 100-5 MCG/ACT Aero Commonly known as: DULERA   Spiriva HandiHaler 18 MCG inhalation capsule Generic drug: tiotropium   spironolactone 25 MG tablet Commonly known as: ALDACTONE   trimethoprim-polymyxin b ophthalmic solution Commonly known as: POLYTRIM       TAKE these medications    Accu-Chek Aviva Plus test strip Generic drug: glucose blood USE TO TEST BLOOD SUGARS AS DIRECTED What changed: See the new instructions.   accu-chek multiclix lancets Use to check your blood sugar four times daily: early morning, before a meal, two hours after a meal, and bedtime   amLODipine 10 MG tablet Commonly known as: NORVASC TAKE 1 TABLET BY  MOUTH EVERY DAY   apixaban 5 MG Tabs tablet Commonly known as: ELIQUIS Take 1 tablet (5 mg total) by mouth 2 (two) times daily.   atorvastatin 80 MG tablet Commonly known as: LIPITOR TAKE 1 TABLET BY MOUTH EVERY DAY   ezetimibe 10 MG tablet Commonly known as: ZETIA Take 1 tablet (10 mg total) by mouth daily.   guaiFENesin-dextromethorphan 100-10 MG/5ML syrup Commonly known as: ROBITUSSIN DM Take 10 mLs by mouth at bedtime.   hydrOXYzine 25 MG tablet Commonly known as: ATARAX Take 1 tablet (25 mg total) by mouth every 6 (six) hours as needed for anxiety.   insulin glargine 100 UNIT/ML Solostar Pen Commonly known as: LANTUS Inject 30 Units into the skin daily. What changed: how much to take   insulin lispro 100 UNIT/ML KwikPen Commonly known as: HUMALOG Inject 14 units before breakfast and lunch, inject 20 units before dinner   Insulin Pen Needle 32G X 4 MM Misc Use to inject insulin 4 times a day. The patient is insulin requiring, ICD 10 code 11.10. The patient injects 4 times per day.   B-D UF III MINI PEN NEEDLES 31G X 5 MM Misc Generic drug: Insulin Pen Needle USE 3 TIMES A DAY WITH HUMALOG   Insulin Syringe-Needle U-100 31G X 15/64" 0.3 ML Misc Use to inject Humalog before meals three times a day   levofloxacin 750 MG tablet Commonly known as: Levaquin Take 1 tablet (750 mg total) by mouth daily for 7 days.   Medihoney Wound/Burn Dressing Gel APPLY TOPICALLY DAILY * OTC ITEM What changed:  See the new instructions.   mupirocin ointment 2 % Commonly known as: BACTROBAN Place 1 Application into the nose 2 (two) times daily for 11 days.   nystatin powder Commonly known as: nystatin Apply 1 Application topically 3 (three) times daily.   ondansetron 4 MG tablet Commonly known as: Zofran Take 1 tablet (4 mg total) by mouth daily as needed for nausea or vomiting.   phenol 1.4 % Liqd Commonly known as: CHLORASEPTIC Use as directed 1 spray in the mouth or  throat as needed for throat irritation / pain.   predniSONE 20 MG tablet Commonly known as: DELTASONE Take 2 tablets (40 mg total) by mouth daily for 5 days, then take 1 tablet (20mg ) once daily for 5 days. Start taking on: June 29, 2023   predniSONE 20 MG tablet Commonly known as: DELTASONE Beginning on 07/04/23, Take 1 tablet (20 mg total) by mouth daily for 5 days. Start taking on: July 04, 2023   sacubitril-valsartan 24-26 MG Commonly known as: ENTRESTO Take 1 tablet by mouth 2 (two) times daily. Replaces: Entresto 97-103 MG   senna 8.6 MG Tabs tablet Commonly known as: SENOKOT Take 2 tablets (17.2 mg total) by mouth daily.   simethicone 80 MG chewable tablet Commonly known as: MYLICON Chew 1 tablet (80 mg total) by mouth every 6 (six) hours as needed for flatulence (bloating).   torsemide 20 MG tablet Commonly known as: DEMADEX Take 2 tablets (40 mg total) by mouth daily. What changed: how much to take   Trelegy Ellipta 100-62.5-25 MCG/ACT Aepb Generic drug: Fluticasone-Umeclidin-Vilant Inhale 1 puff into the lungs daily.   Trulicity 1.5 MG/0.5ML Sopn Generic drug: Dulaglutide Inject 1.5 mg into the skin once a week.               Durable Medical Equipment  (From admission, onward)           Start     Ordered   06/28/23 1435  For home use only DME Tub bench  Once        06/28/23 1435   06/18/23 1255  For home use only DME 4 wheeled rolling walker with seat  Once       Question:  Patient needs a walker to treat with the following condition  Answer:  Weakness   06/18/23 1255            DISPOSITION AND FOLLOW-UP:  Alicia [name] was discharged from [facility] in [place] condition. At the hospital follow up visit please address:  Acute on Chronic Hypoxic Respiratory failure/ Heart failure: Patient is discharged on Entresto 24-26 mg twice a day and torsemide 40 mg daily.  Spironolactone is held.  Patient is currently on 4 L  oxygen via nasal cannula saturating at 96%. COPD exacerbation: Patient is discharged on Trelegy Ellipta inhaler and Guaifenesin-dextromethorphan at bedtime. Patient is also discharged on prednisone taper. She is discharged on prednisone 40 mg for 5 days, August 6 to August 10. Then tapered to prednisone 20 mg for 5 days August 11 August 15. Hospital-acquired pneumonia: Patient is discharged on levofloxacin 750 mg daily for 7 days.  Please assess patient for signs symptoms of fever, worsening cough, sputum production. T2DM: Patient is discharged on Insulin lispro 14 units before breakfast and lunch and 20 units before dinner. Insulin Glargine 30 units daily. Please check her blood sugar levels in the clinic and titrate insulin regimen as appropriate.   Stroke prevention: Patient is started on Eliquis 5mg   BID for stroke prophylaxis  Anxiety: Patient was discharged on hydroxyzine 25 mg q6h as needed. This is not a long term medication and should not continued for long term. Please evaluate patient's needs for this medication.   Nausea/ Throat irritation: Patient is started on Zofran 4 mg for nausea and Chloraspetic spray for throat irritation.   Follow-up Recommendations: Consults: PCP Labs: Basic Metabolic Profile, CBC Studies: None Medications: Trelegy Ellipta, Prednisone 40 mg, 20 mg, Levaquin 100 mg, Torsemide 40 mg, Entresto 24-26 mg, Insulin Glargine 30 units daily and insulin Lispro 14 U before breakfast and lunch, Lispro 20 units before dinner.   Follow-up Appointments:  Follow-up Information     [facility]. Go in 26 day(s).   Specialty: Cardiology Why: Hospital follow up 07/14/23 @ 1:30 pm PLEASE bring a current medication list to appointment FREE valet parking , Entrance C, off [employer] information: [address] [place] [address] [phone]        [facility] Follow up.   Why: [facility] Contact  information: [phone]        Care, [facility] Follow up.   Specialty: Home Health Services Why: Agency will call you to set up apt times Contact information: [address] [place] [address] [phone]         [facility] at [facility]. Schedule an appointment as soon as possible for a visit in 2 week(s).   Specialty: Pulmonology Why: Follow up with Dr Celine Mans or nurse practitioner after discharge. Contact information: [address] [place] [address] [phone]        Morene Crocker, MD Follow up on 07/07/2023.   Specialty: Internal Medicine Why: Hospital Follow up Contact information: [address] [place] [address] [phone]                 HOSPITAL COURSE:   Acute on chronic hypoxic respiratory failure 2/2 Heart Failure exacerbation, COPD exacerbation. Hospital Acquired Pneumonia.  Pt has a history of chronic respiratory failure in setting of HFpEF and COPD requiring 4-5L O2 at baseline. Pt presented 7/23 with increasing O2 needs and desaturations suspected to be 2/2 to combination of COPD exacerbation in the setting of viral URI combined with HF exacerbation. CXR showed evidence of pulmonary congestion likely in setting of pulmonary edema, although consolidation could not be ruled out. Pt started on diuresis and O2 supplementation. Initially on BiPAP, later transitioned to HFNC with decreasing O2 requirements until evening of 7/27, when pt desated and required BiPAP overnight. She intermittently required nighttime BiPAP throughout hospitalization. Repeat CXR done 7/31 after increased O2 needs showed signs of fluid, especially on R side, no evidence PNA; this was after am torsemide held for low K. Torsemide restarted in pm on 7/31 as K improved following supplementation. COVID PCR done 8/1 was negative.  Overnight on 8/1, pt was placed on HFNC with FiO2 of 90%-100% by  nightshift RT; there was no documented reason of pt decompensation or desaturation. Upon assessment of this pt the following morning on 8/2, she was stable on HFNC at 15 L/min at FiO2 of 60%. She has not required FiO2 of greater than 70% following this. We did pursue workup of the increased FiO2 with repeat CXR done 8/2 showing stable bibasilar opacities as compared to CXR done 7/31. Pt started on IV Vancomycin and Cefepime over concern of possible HAP on 8/2. Pt seen  by PCCM on 8/2 and did not believe pt needed ICU-level care at this time; they recommended increasing Pulmicort dose from 0.25 mg BID to 0.5 mg BID. They also recommended starting her on IV Solu-medrol 40 mg BID and Yupelri nebs in addition to her other breathing treatments. Sputum culture collected 8/2 showed gram positive cocci in pairs. Gave pt addition IV Lasix 120 mg dose x1 on 8/2 for further diuresis. Patient was transitioned from HFNC to 6L O2, currently patient is on 4 L oxygen through her nasal cannula, back to her home oxygen.    HFpEF Echo done 02/07/23 showed LVEF preserved at 60-65%, moderate LVH, Grade II diastolic dysfunction. Repeat echo done 06/16/23 showed LVEF 55-60%, elevated left end diastolic pressure, and IVC dilation suggestive of fluid overload. She was on torsemide, Entresto, spironolactone, Trulicity outpatient for HFpEF; Patient was started on torsemide and entresto. Patient's spironolactone is held.    COPD Pt with a history of COPD requiring 4-5L O2 at home at baseline. Was on Dulera at home. In the hospital, she was treated with Pulmicort, Brovana, levalbueterol (pt experienced palpitations with albuterol/Duonebs), ipratropium. She was also given a 5 day prednisone burst completed 7/27. Also treated with guaifenesin, hypertonic NS to improve cough/breathing. Pt started on increased Pulmicort (0.5 mg BID from 0.25 mg BID), Yupelri nebs, and IV Solu-medrol 40 mg BID by PCCM on 8/2. She should be transitioned from  [facility] to Trelegy inhaler at discharge.   AKI, resolved anuria Baseline Cr around 0.9. Pt's Cr elevated in the hospital; was initially anuric while in the hospital. Renal US done 7/24 showed no acute findings, no hydronephrosis. Bladder scan showed 200 cc in bladder. Foley placed morning of 7/24 with 150 cc drained. She was started on IV Lasix at 120 mg TID; outpatient, she was on torsemide 60 mg daily. Transitioned from IV diuresis to po torsemide 40 mg BID on 7/28, then transitioned to torsemide 40 mg daily on 7/29. Foley removed on 7/29.   Paroxysmal Atrial Fibrillation Pt had episode of A fib with RVR following replacement of BiPAP overnight on 7/27. She was started on Eliquis 5 mg BID for stroke prophylaxis; she was started on Lopressor 25 mg BID for rate control. Lopressor held on 7/31 in setting of HR in 50s.   Junctional Rhythm Pt has history of SSS. EKG on 7/24 showed junctional rhythm. Pt then had asymptomatic NSVT episode on 7/25. Avoiding AV nodal blocking agents.   T2DM Home regimen includes Trulicity, Lantus and Humalog. Initially hyperglycemic during stay, thought to be impacted by steroid treatment. She was managed on basal insulin, Novolog TID, and SSI.   Acute Otitis Media, resolved Pt previously reported weeks of R ear pain and on admission exam had an opaque bulging TM on the Right. Pt managed with Augmentin 875-125 mg BID 5 day course with resolution of ear pain.   Respiratory Alkalosis with concomitate Metabolic Alkalosis, improving Pt likely has respiratory acidosis at baseline due to COPD with compensating metabolic alkalosis at baseline. Contraction alkalosis following diuresis, resolving.    Chronic Conditions: Normocytic anemia: Stable from prior. Hypertension: Holding amlodipine 10 mg daily at nephro's rec. Resuming entresto, and holding spironolactone.  Hyperlipidemia, CAD: Continued on atorvastatin 80 mg daily, Zetia 10 mg daily  DISCHARGE INSTRUCTIONS:   Alicia [name],  It was a pleasure caring for you during admission! You came to the hospital with shortness of breath, which we believe was due to a combination of exacerbations of both your heart  failure and your COPD. You were treated with diuresis (making you urinate more to get the extra fluid out of your body) and with inhaled breathing treatments.   For your Heart failure exacerbation, We have CHANGED the following medication : - Entresto 24-26 mg, take one tablet by mouth two times daily  - Torsemide 40 mg, take one tablet by mouth daily  STOP taking spironolactone   For your COPD exacerbation:  We have started you on the following medication - Trelegy Ellipta, 3 in 1 inhaler, please take 1 puff in your mouth every day for 30 days - Guaifenesin-dextromethorphan 100-10 mg/ 5mL syrup, take 10 mls by mouth at bedtime.  Please take the Prednisone on the correct date and correct dose - Prednisone 40 mg , take one tablet by mouth for 5 days (08/06-08/10) - Prednisone 20 mg, take one tablet by mouth for 5 day (08/11-08/15)  4. For your Hospital Acquired Pneumonia We have started you on the following medication - Levofloxacin (Levaquin) 750 mg, take one tablet once everyday for 7 days.  5. For your nausea/ throat  - Zofran 4 mg, take one tablet by mouth as needed - Chloraseptic spray, one spray in the mouth or throat as needed for throat irritation  6. For your bowels/ constipation - Simethicone (Mylicon) 80 mg, take 1 tablet by mouth every 6 hours as needed for bloating.   7. Hypertension - Please continue amlodipine 10 mg, take one tablet by mouth everyday   8. Hyperlipidemia  - Please continue Lipitor 80 mg and Zetia 10 mg daily  9. T2DM  We have changed the following medication - Insulin lispro 14 units before breakfast and lunch, inject 20 units before dinner - Insulin Glargine 30 units into skin daily   10. Stroke prevention - Eliquis 5mg  BID for stroke prophylaxis   11.  Anxiety: Patient was discharged on hydroxyzine 25 mg q6h as needed. This is not a long term medication and should not continued for long term.    Please follow up with Dr. Theophilus Bones at our Internal Medicine Clinic August 14th 2024 at 1:45 PM  If you have any of these following symptoms, please call us or seek care at an emergency department: -Chest Pain -Difficulty Breathing -Worsening abdominal pain -Syncope (passing out) -Drooping of face -Slurred speech -Sudden weakness in your leg or arm -Fever -Chills -blood in the stool -dark black, sticky stool   I know it's taken longer than we all expected to fully recover, but we are glad you're feeling better than you were when you first came to the hospital.   Thank you for allowing [name] to be a part of your care team,  Governor Rooks, medical student Dr. Jeral Pinch, PGY1 Dr. Modena Slater, PGY2  SUBJECTIVE:   Patient was evaluated at bedside, she is currently on 6L oxygen via nasal cannula saturating at 96%. Patient reports she is doing great. Patient denies any current chest pains or acute shortness of breath. Patient had a bowel movement yesterday, no diarrhea. She denies any current abdominal pain. Patient had a breakfast this morning; she tolerated that well.   Discharge Vitals:   BP (!) 158/98 (BP Location: Left Arm)   Pulse (!) 53   Temp 97.9 F (36.6 C) (Oral)   Resp 19   Ht 5\' 7"  (1.702 m)   Wt 95.5 kg   LMP  (LMP Unknown)   SpO2 94%   BMI 32.97 kg/m   OBJECTIVE:  Physical Exam  General: Pt is lying down in hospital bed with head of bed elevated, appears comfortable, no acute distress. Currently on 6L oxygen via nasal cannula Cardiovascular: RRR, no murmurs, rubs, gallops. Pulmonary: Normal work of breathing; Course crackles RLL Extremities: No BLE edema; warm, dry. Neuro: no focal deficits. Psych: Normal mood and affect  Pertinent Labs, Studies, and Procedures:     Latest Ref Rng & Units 06/28/2023     2:19 AM 06/26/2023   12:34 AM 06/25/2023   12:21 AM  CBC  WBC 4.0 - 10.5 K/uL 18.1  11.6  10.8   Hemoglobin 12.0 - 15.0 g/dL 9.6  9.7  9.4   Hematocrit 36.0 - 46.0 % 29.6  30.2  29.5   Platelets 150 - 400 K/uL 347  317  276        Latest Ref Rng & Units 06/28/2023    2:19 AM 06/27/2023   12:26 AM 06/26/2023   12:34 AM  CMP  Glucose 70 - 99 mg/dL 161  096  045   BUN 8 - 23 mg/dL 86  96  91   Creatinine 0.44 - 1.00 mg/dL 4.09  8.11  9.14   Sodium 135 - 145 mmol/L 129  130  129   Potassium 3.5 - 5.1 mmol/L 4.6  4.4  4.8   Chloride 98 - 111 mmol/L 91  88  89   CO2 22 - 32 mmol/L 28  29  29    Calcium 8.9 - 10.3 mg/dL 8.9  9.0  8.5     DG CHEST PORT 1 VIEW  Result Date: 06/16/2023 CLINICAL DATA:  Acute hypoxic respiratory failure. EXAM: PORTABLE CHEST 1 VIEW COMPARISON:  06/15/2023 FINDINGS: Cardiomegaly and aortic atherosclerosis as seen previously. Pulmonary venous hypertension with diminishing interstitial edema. No visible effusion. No worsening or new finding. IMPRESSION: Congestive heart failure with diminishing interstitial edema. Electronically Signed   By: Paulina Fusi M.D.   On: 06/16/2023 15:23   ECHOCARDIOGRAM COMPLETE  Result Date: 06/16/2023    ECHOCARDIOGRAM REPORT   Patient Name:   ARIJ LYDY Date of Exam: 06/16/2023 Medical Rec #:  [id number]       Height:       67.0 in Accession #:    [id number]      Weight:       216.0 lb Date of Birth:  14-Feb-1956        BSA:          2.089 m Patient Age:    67 years        BP:           148/49 mmHg Patient Gender: F               HR:           58 bpm. Exam Location:  Inpatient Procedure: 2D Echo, Cardiac Doppler, Color Doppler and Intracardiac            Opacification Agent Indications:    Dyspnea R06.00  History:        Patient has prior history of Echocardiogram examinations, most                 recent 02/07/2023. CAD, COPD; Risk Factors:Hypertension, Diabetes                 and Dyslipidemia.  Sonographer:    Lucendia Herrlich Referring  Phys: [id number] CAROLYN GUILLOUD IMPRESSIONS  1. Left ventricular ejection fraction, by estimation, is 55 to 60%. The left ventricle has  normal function. The left ventricle has no regional wall motion abnormalities. The left ventricular internal cavity size was mildly to moderately dilated. There is moderate concentric left ventricular hypertrophy. Left ventricular diastolic parameters are consistent with Grade III diastolic dysfunction (restrictive). Elevated left ventricular end-diastolic pressure.  2. Right ventricular systolic function is normal. The right ventricular size is not well visualized. There is moderately elevated pulmonary artery systolic pressure. The estimated right ventricular systolic pressure is 45.5 mmHg.  3. Left atrial size was mild to moderately dilated.  4. Right atrial size was mild to moderately dilated.  5. The mitral valve is grossly normal. Trivial mitral valve regurgitation. No evidence of mitral stenosis.  6. The aortic valve is tricuspid. Aortic valve regurgitation is not visualized. No aortic stenosis is present.  7. The inferior vena cava is dilated in size with <50% respiratory variability, suggesting right atrial pressure of 15 mmHg. Comparison(s): Prior images reviewed side by side. Conclusion(s)/Recommendation(s): Normal systolic function with grade 3 diastolic dysfunction and elevated LVEDP. FINDINGS  Left Ventricle: Left ventricular ejection fraction, by estimation, is 55 to 60%. The left ventricle has normal function. The left ventricle has no regional wall motion abnormalities. Definity contrast agent was given IV to delineate the left ventricular  endocardial borders. The left ventricular internal cavity size was mildly to moderately dilated. There is moderate concentric left ventricular hypertrophy. Left ventricular diastolic parameters are consistent with Grade III diastolic dysfunction (restrictive). Elevated left ventricular end-diastolic pressure. Right Ventricle: The  right ventricular size is not well visualized. Right vetricular wall thickness was not well visualized. Right ventricular systolic function is normal. There is moderately elevated pulmonary artery systolic pressure. The tricuspid regurgitant velocity is 2.76 m/s, and with an assumed right atrial pressure of 15 mmHg, the estimated right ventricular systolic pressure is 45.5 mmHg. Left Atrium: Left atrial size was mild to moderately dilated. Right Atrium: Right atrial size was mild to moderately dilated. Pericardium: Trivial pericardial effusion is present. Mitral Valve: The mitral valve is grossly normal. Trivial mitral valve regurgitation. No evidence of mitral valve stenosis. Tricuspid Valve: The tricuspid valve is grossly normal. Tricuspid valve regurgitation is mild . No evidence of tricuspid stenosis. Aortic Valve: The aortic valve is tricuspid. Aortic valve regurgitation is not visualized. No aortic stenosis is present. Aortic valve peak gradient measures 11.2 mmHg. Pulmonic Valve: The pulmonic valve was not well visualized. Pulmonic valve regurgitation is not visualized. Aorta: The aortic root, ascending aorta, aortic arch and descending aorta are all structurally normal, with no evidence of dilitation or obstruction. Venous: The inferior vena cava is dilated in size with less than 50% respiratory variability, suggesting right atrial pressure of 15 mmHg. IAS/Shunts: The atrial septum is grossly normal.  LEFT VENTRICLE PLAX 2D LVIDd:         6.00 cm   Diastology LVIDs:         4.30 cm   LV e' medial:    5.26 cm/s LV PW:         1.10 cm   LV E/e' medial:  29.1 LV IVS:        1.40 cm   LV e' lateral:   6.40 cm/s LVOT diam:     2.20 cm   LV E/e' lateral: 23.9 LV SV:         81 LV SV Index:   39 LVOT Area:     3.80 cm  RIGHT VENTRICLE             IVC RV  S prime:     16.30 cm/s  IVC diam: 2.50 cm TAPSE (M-mode): 1.9 cm LEFT ATRIUM             Index        RIGHT ATRIUM           Index LA diam:        4.70 cm 2.25  cm/m   RA Area:     19.80 cm LA Vol (A2C):   55.6 ml 26.61 ml/m  RA Volume:   59.60 ml  28.52 ml/m LA Vol (A4C):   72.9 ml 34.89 ml/m LA Biplane Vol: 69.5 ml 33.26 ml/m  AORTIC VALVE AV Area (Vmax): 2.53 cm AV Vmax:        167.00 cm/s AV Peak Grad:   11.2 mmHg LVOT Vmax:      111.00 cm/s LVOT Vmean:     72.500 cm/s LVOT VTI:       0.213 m  AORTA Ao Root diam: 3.00 cm Ao Asc diam:  3.40 cm MITRAL VALVE                TRICUSPID VALVE MV Area (PHT): 4.21 cm     TR Peak grad:   30.5 mmHg MV Decel Time: 180 msec     TR Vmax:        276.00 cm/s MR Peak grad: 43.0 mmHg MR Vmax:      328.00 cm/s   SHUNTS MV E velocity: 153.00 cm/s  Systemic VTI:  0.21 m MV A velocity: 41.30 cm/s   Systemic Diam: 2.20 cm MV E/A ratio:  3.70 Jodelle Red MD Electronically signed by Jodelle Red MD Signature Date/Time: 06/16/2023/1:05:38 PM    Final    US RENAL  Result Date: 06/16/2023 CLINICAL DATA:  Difficulty urinating EXAM: RENAL / URINARY TRACT ULTRASOUND COMPLETE COMPARISON:  None Available. FINDINGS: Right Kidney: Renal measurements: 10.3 x 5 x 5.3 cm = volume: 141 mL. Echogenicity within normal limits. No mass or hydronephrosis visualized. Left Kidney: Renal measurements: 11 x 4.7 x 4.7 cm = volume: 127 mL. Echogenicity within normal limits. No mass or hydronephrosis visualized. Bladder: Appears normal for degree of bladder distention. Other: None. IMPRESSION: No acute findings.  No hydronephrosis. Electronically Signed   By: Charlett Nose M.D.   On: 06/16/2023 01:21   DG Chest Portable 1 View  Result Date: 06/15/2023 CLINICAL DATA:  Shortness of breath EXAM: PORTABLE CHEST 1 VIEW COMPARISON:  03/16/2023 and prior radiographs FINDINGS: Cardiomegaly with pulmonary vascular congestion noted. Bilateral interstitial opacities are present suspicious for interstitial edema. Question trace bilateral pleural effusions. Mild bibasilar opacities/atelectasis identified. There is no evidence of pneumothorax or acute  bony abnormality. IMPRESSION: 1. Cardiomegaly with pulmonary vascular congestion and probable interstitial edema. 2. Question trace bilateral pleural effusions. 3. Mild bibasilar opacities, likely atelectasis. Electronically Signed   By: Harmon Pier M.D.   On: 06/15/2023 17:45     Signed: Jeral Pinch, D.O.  Internal Medicine Resident, PGY-1 [facility]  Pager: [phone] 3:34 PM, 06/28/2023

## 2023-06-28 NOTE — Progress Notes (Signed)
   06/27/23 2027  BiPAP/CPAP/SIPAP  $ Non-Invasive Ventilator  Non-Invasive Vent Subsequent  Reason BIPAP/CPAP not in use Non-compliant (pt refused. No resp distress noted at this time. will continue to monitor)

## 2023-06-30 ENCOUNTER — Ambulatory Visit: Payer: Self-pay

## 2023-06-30 NOTE — Patient Outreach (Signed)
  Care Coordination   Follow Up Visit Note   06/30/2023 Name: Alicia [name] MRN: [id number] DOB: 28-Dec-1955  Alicia [name] is a 67 y.o. year old female who sees Oswaldo Done, Marquita Palms, MD for primary care. I spoke with  Alicia [name] by phone today.  What matters to the patients health and wellness today?  Alicia [name] reports that her breathing is improving. I reached out to Letha Cape, RN, who is at the hospital, and she was very helpful in confirming Alicia [name]'s assistance and arranging for a shower chair for Alicia [name] since the bench was too big.   SDOH assessments and interventions completed:  No     Care Coordination Interventions:  Yes, provided   Interventions Today    Flowsheet Row Most Recent Value  Chronic Disease   Chronic disease during today's visit Other  [Acute on chronic hypoxic respiratory failure]  General Interventions   General Interventions Discussed/Reviewed General Interventions Discussed, General Interventions Reviewed, Durable Medical Equipment (DME), Communication with  [Discussed oxygen levels 3 to 4 liters and 98% o2sat]  Durable Medical Equipment (DME) Shower bench, Other  [Shower bench switched to shower chair]  Communication with RN  Letha Cape RN called [facility] to follow up on orders and DME]  Nutrition Interventions   Nutrition Discussed/Reviewed Nutrition Discussed  [Heart healthy diet]  Pharmacy Interventions   Pharmacy Dicussed/Reviewed Pharmacy Topics Discussed, Pharmacy Topics Reviewed  Safety Interventions   Safety Discussed/Reviewed Safety Reviewed        Follow up plan: Follow up call scheduled for 07/07/23  230 pm    Encounter Outcome:  Pt. Visit Completed   [name] RN, BSN, [facility] Care Coordinator [facility]   Phone: [phone]

## 2023-06-30 NOTE — Patient Instructions (Signed)
Visit Information  Thank you for taking time to visit with me today. Please don't hesitate to contact me if I can be of assistance to you.   Following are the goals we discussed today:   Goals Addressed             This Visit's Progress    I feel better after leaving the hospital       Care Coordination Interventions: Care coordination needs related to post discharge instructions, including medication review, MD follow up review, and PT/OT follow up. Reviewed medications with patient and discussed Levaquin Prednisone Elaquis and Entresto Reviewed scheduled/upcoming provider appointments including: Lonia Skinner 07/06/21  [facility] 07/14/23 930 am Beth Np,  [facility] V[facility] 07/14/23 130 pm [facility] called         Our next appointment is by telephone on 07/07/23 at 230 pm  Please call the care guide team at [phone] if you need to cancel or reschedule your appointment.   If you are experiencing a Mental Health or Behavioral Health Crisis or need someone to talk to, please call 1-800-273-TALK (toll free, 24 hour hotline)  Patient verbalizes understanding of instructions and care plan provided today and agrees to view in MyChart. Active MyChart status and patient understanding of how to access instructions and care plan via MyChart confirmed with patient.     [name] RN, BSN, [facility] Care Coordinator [facility]   Phone: [phone]

## 2023-07-02 ENCOUNTER — Telehealth: Payer: Self-pay

## 2023-07-02 NOTE — Patient Outreach (Signed)
  Care Coordination   Follow Up Visit Note   07/02/2023 Name: Alicia [name] MRN: [id number] DOB: 03/15/1956  Alicia [name] is a 67 y.o. year old female who sees Oswaldo Done, Marquita Palms, MD for primary care. I spoke with  Alicia [name] by phone today.  What matters to the patients health and wellness today?     I received a call from Alicia [name] notifying me that she had yet to see her shower chair, which was supposed to be switched out with the shower bench too big for her bathtub. I called and left a message with [employer].    SDOH assessments and interventions completed:  No     Care Coordination Interventions:  Yes, provided   Interventions Today    Flowsheet Row Most Recent Value  General Interventions   General Interventions Discussed/Reviewed General Interventions Discussed, Communication with  Kerney Elbe and left a message]  Durable Medical Equipment (DME) Other  [Shower chair]        Follow up plan:  At next scheduled interval    Encounter Outcome:  Pt. Visit Completed   [name] RN, BSN, [facility] Care Coordinator [facility]   Phone: [phone]

## 2023-07-02 NOTE — Patient Instructions (Signed)
Visit Information  Thank you for taking time to visit with me today. Please don't hesitate to contact me if I can be of assistance to you.   Following are the goals we discussed today:   Goals Addressed             This Visit's Progress    I feel better after leaving the hospital       Care Coordination Interventions: Care coordination needs related to post discharge instructions, including medication review, MD follow up review, and PT/OT follow up. Reviewed medications with patient and discussed Levaquin Prednisone Elaquis and Entresto Reviewed scheduled/upcoming provider appointments including: Lonia Skinner 07/06/21  [facility] 07/14/23 930 am Beth Np,  [facility] V[facility] 07/14/23 130 pm [facility] called Follow up on DME Shower chair should be delivered to replace shower bench to big for bathtub.         If you are experiencing a Mental Health or Behavioral Health Crisis or need someone to talk to, please call 1-800-273-TALK (toll free, 24 hour hotline)   Patient verbalizes understanding of instructions and care plan provided today and agrees to view in MyChart. Active MyChart status and patient understanding of how to access instructions and care plan via MyChart confirmed with patient.     At the next scheduled interval  [name] RN, BSN, [facility] Care Coordinator [facility]   Phone: [phone]

## 2023-07-04 ENCOUNTER — Other Ambulatory Visit: Payer: Self-pay | Admitting: Internal Medicine

## 2023-07-06 DIAGNOSIS — D649 Anemia, unspecified: Secondary | ICD-10-CM | POA: Diagnosis not present

## 2023-07-06 DIAGNOSIS — Z9981 Dependence on supplemental oxygen: Secondary | ICD-10-CM | POA: Diagnosis not present

## 2023-07-06 DIAGNOSIS — B9689 Other specified bacterial agents as the cause of diseases classified elsewhere: Secondary | ICD-10-CM | POA: Diagnosis not present

## 2023-07-06 DIAGNOSIS — Z794 Long term (current) use of insulin: Secondary | ICD-10-CM | POA: Diagnosis not present

## 2023-07-06 DIAGNOSIS — I480 Paroxysmal atrial fibrillation: Secondary | ICD-10-CM | POA: Diagnosis not present

## 2023-07-06 DIAGNOSIS — I081 Rheumatic disorders of both mitral and tricuspid valves: Secondary | ICD-10-CM | POA: Diagnosis not present

## 2023-07-06 DIAGNOSIS — I5043 Acute on chronic combined systolic (congestive) and diastolic (congestive) heart failure: Secondary | ICD-10-CM | POA: Diagnosis not present

## 2023-07-06 DIAGNOSIS — S3092XD Unspecified superficial injury of abdominal wall, subsequent encounter: Secondary | ICD-10-CM | POA: Diagnosis not present

## 2023-07-06 DIAGNOSIS — J440 Chronic obstructive pulmonary disease with acute lower respiratory infection: Secondary | ICD-10-CM | POA: Diagnosis not present

## 2023-07-06 DIAGNOSIS — Z7952 Long term (current) use of systemic steroids: Secondary | ICD-10-CM | POA: Diagnosis not present

## 2023-07-06 DIAGNOSIS — Z7985 Long-term (current) use of injectable non-insulin antidiabetic drugs: Secondary | ICD-10-CM | POA: Diagnosis not present

## 2023-07-06 DIAGNOSIS — I110 Hypertensive heart disease with heart failure: Secondary | ICD-10-CM | POA: Diagnosis not present

## 2023-07-06 DIAGNOSIS — I2510 Atherosclerotic heart disease of native coronary artery without angina pectoris: Secondary | ICD-10-CM | POA: Diagnosis not present

## 2023-07-06 DIAGNOSIS — Z7901 Long term (current) use of anticoagulants: Secondary | ICD-10-CM | POA: Diagnosis not present

## 2023-07-06 DIAGNOSIS — Z9181 History of falling: Secondary | ICD-10-CM | POA: Diagnosis not present

## 2023-07-06 DIAGNOSIS — E874 Mixed disorder of acid-base balance: Secondary | ICD-10-CM | POA: Diagnosis not present

## 2023-07-06 DIAGNOSIS — E785 Hyperlipidemia, unspecified: Secondary | ICD-10-CM | POA: Diagnosis not present

## 2023-07-06 DIAGNOSIS — J9811 Atelectasis: Secondary | ICD-10-CM | POA: Diagnosis not present

## 2023-07-06 DIAGNOSIS — I3139 Other pericardial effusion (noninflammatory): Secondary | ICD-10-CM | POA: Diagnosis not present

## 2023-07-06 DIAGNOSIS — J441 Chronic obstructive pulmonary disease with (acute) exacerbation: Secondary | ICD-10-CM | POA: Diagnosis not present

## 2023-07-06 DIAGNOSIS — E119 Type 2 diabetes mellitus without complications: Secondary | ICD-10-CM | POA: Diagnosis not present

## 2023-07-06 DIAGNOSIS — J9621 Acute and chronic respiratory failure with hypoxia: Secondary | ICD-10-CM | POA: Diagnosis not present

## 2023-07-06 DIAGNOSIS — Z7951 Long term (current) use of inhaled steroids: Secondary | ICD-10-CM | POA: Diagnosis not present
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
## 2023-07-07 ENCOUNTER — Ambulatory Visit: Payer: Self-pay

## 2023-07-07 ENCOUNTER — Ambulatory Visit (INDEPENDENT_AMBULATORY_CARE_PROVIDER_SITE_OTHER): Payer: 59 | Admitting: Student

## 2023-07-07 ENCOUNTER — Encounter: Payer: Self-pay | Admitting: Student

## 2023-07-07 VITALS — BP 148/57 | HR 75 | Temp 98.2°F | Ht 67.0 in | Wt 209.0 lb

## 2023-07-07 DIAGNOSIS — I5032 Chronic diastolic (congestive) heart failure: Secondary | ICD-10-CM | POA: Diagnosis not present

## 2023-07-07 DIAGNOSIS — Z794 Long term (current) use of insulin: Secondary | ICD-10-CM | POA: Diagnosis not present

## 2023-07-07 DIAGNOSIS — J449 Chronic obstructive pulmonary disease, unspecified: Secondary | ICD-10-CM

## 2023-07-07 DIAGNOSIS — E874 Mixed disorder of acid-base balance: Secondary | ICD-10-CM | POA: Diagnosis not present

## 2023-07-07 DIAGNOSIS — D509 Iron deficiency anemia, unspecified: Secondary | ICD-10-CM | POA: Diagnosis not present

## 2023-07-07 DIAGNOSIS — J9621 Acute and chronic respiratory failure with hypoxia: Secondary | ICD-10-CM | POA: Diagnosis not present

## 2023-07-07 DIAGNOSIS — N179 Acute kidney failure, unspecified: Secondary | ICD-10-CM | POA: Diagnosis not present

## 2023-07-07 DIAGNOSIS — I081 Rheumatic disorders of both mitral and tricuspid valves: Secondary | ICD-10-CM | POA: Diagnosis not present

## 2023-07-07 DIAGNOSIS — R110 Nausea: Secondary | ICD-10-CM

## 2023-07-07 DIAGNOSIS — I110 Hypertensive heart disease with heart failure: Secondary | ICD-10-CM

## 2023-07-07 DIAGNOSIS — I480 Paroxysmal atrial fibrillation: Secondary | ICD-10-CM | POA: Diagnosis not present

## 2023-07-07 DIAGNOSIS — Z7985 Long-term (current) use of injectable non-insulin antidiabetic drugs: Secondary | ICD-10-CM | POA: Diagnosis not present

## 2023-07-07 DIAGNOSIS — D649 Anemia, unspecified: Secondary | ICD-10-CM

## 2023-07-07 DIAGNOSIS — J432 Centrilobular emphysema: Secondary | ICD-10-CM

## 2023-07-07 DIAGNOSIS — D6489 Other specified anemias: Secondary | ICD-10-CM | POA: Diagnosis not present

## 2023-07-07 DIAGNOSIS — Z87891 Personal history of nicotine dependence: Secondary | ICD-10-CM | POA: Diagnosis not present

## 2023-07-07 DIAGNOSIS — B9689 Other specified bacterial agents as the cause of diseases classified elsewhere: Secondary | ICD-10-CM | POA: Diagnosis not present

## 2023-07-07 DIAGNOSIS — J9811 Atelectasis: Secondary | ICD-10-CM | POA: Diagnosis not present

## 2023-07-07 DIAGNOSIS — Z7952 Long term (current) use of systemic steroids: Secondary | ICD-10-CM | POA: Diagnosis not present

## 2023-07-07 DIAGNOSIS — E113393 Type 2 diabetes mellitus with moderate nonproliferative diabetic retinopathy without macular edema, bilateral: Secondary | ICD-10-CM | POA: Diagnosis not present

## 2023-07-07 DIAGNOSIS — E119 Type 2 diabetes mellitus without complications: Secondary | ICD-10-CM | POA: Diagnosis not present

## 2023-07-07 DIAGNOSIS — Z9181 History of falling: Secondary | ICD-10-CM | POA: Diagnosis not present

## 2023-07-07 DIAGNOSIS — S3092XD Unspecified superficial injury of abdominal wall, subsequent encounter: Secondary | ICD-10-CM | POA: Diagnosis not present

## 2023-07-07 DIAGNOSIS — J440 Chronic obstructive pulmonary disease with acute lower respiratory infection: Secondary | ICD-10-CM | POA: Diagnosis not present

## 2023-07-07 DIAGNOSIS — E785 Hyperlipidemia, unspecified: Secondary | ICD-10-CM | POA: Diagnosis not present

## 2023-07-07 DIAGNOSIS — I10 Essential (primary) hypertension: Secondary | ICD-10-CM

## 2023-07-07 DIAGNOSIS — Z7901 Long term (current) use of anticoagulants: Secondary | ICD-10-CM | POA: Diagnosis not present

## 2023-07-07 DIAGNOSIS — Z9981 Dependence on supplemental oxygen: Secondary | ICD-10-CM | POA: Diagnosis not present

## 2023-07-07 DIAGNOSIS — I3139 Other pericardial effusion (noninflammatory): Secondary | ICD-10-CM | POA: Diagnosis not present

## 2023-07-07 DIAGNOSIS — I4891 Unspecified atrial fibrillation: Secondary | ICD-10-CM

## 2023-07-07 DIAGNOSIS — Z7951 Long term (current) use of inhaled steroids: Secondary | ICD-10-CM | POA: Diagnosis not present

## 2023-07-07 DIAGNOSIS — I5043 Acute on chronic combined systolic (congestive) and diastolic (congestive) heart failure: Secondary | ICD-10-CM | POA: Diagnosis not present

## 2023-07-07 DIAGNOSIS — J441 Chronic obstructive pulmonary disease with (acute) exacerbation: Secondary | ICD-10-CM | POA: Diagnosis not present

## 2023-07-07 DIAGNOSIS — I2510 Atherosclerotic heart disease of native coronary artery without angina pectoris: Secondary | ICD-10-CM | POA: Diagnosis not present

## 2023-07-07 NOTE — Patient Instructions (Addendum)
Thank you, Ms.Alaya Kressin Shimamoto for allowing [name] to provide your care today. Today we discussed your breathing, your blood pressure, your medications, your low blood counts,possible iron deficiency, and your transition home.    Medications -No changes to your medications today -We will wait until the blood work results - this will help [name] decide which medication to increase or add for your blood pressure  I will talk with the cardiology specialist tomorrow so we can agree on the change to the medications.    Don't forget to call us if you are missing any of your medications and to use your inhaler daily.  I have ordered the following labs for you:   Lab Orders         BMP8+Anion Gap         CBC no Diff         Iron, TIBC and Ferritin Panel      I will call if any are abnormal. All of your labs can be accessed through "My Chart".   My Chart Access: [email or web address]  Please follow-up in: 1 month for diabetes and for blood pressure follow up    We look forward to seeing you next time. Please call our clinic at [phone] if you have any questions or concerns. The best time to call is Monday-[place] from 9am-4pm, but there is someone available 24/7. If after hours or the weekend, call the main hospital number and ask for the Internal Medicine Resident On-Call. If you need medication refills, please notify your pharmacy one week in advance and they will send [name] a request.   Thank you for letting us take part in your care. Wishing you the best!  Morene Crocker, MD 07/07/2023, 2:34 PM [facility] Resident, PGY-1

## 2023-07-07 NOTE — Progress Notes (Unsigned)
Subjective:  CC: HFU  HPI:  [name] is a 67 y.o. female with a past medical history stated below and presents today for HFU after a recent admission of acute on chronic respiratory failure secondarty to a mixed picture HFrecEF exacerbation and exacerbation of known severe COPD . Her hospitalization was complication by oliguric AKI now resolved, AFib with RVR with NSR at discharge, and hospital acquired pneumonia for which Alicia [name] was discharged on antibiotics and a prolonged prednisone course.   She presents to clinic today and reports that has been doing well in her transition home from the hospital. She brought her portable O2 machine, drove herself to this appointment, and we were able to review all her medications.  She denies chest pain, increased need for supplemental O2 beyond 4L at home, increased cough, sputum production, or GI symptoms. She has been able to urinate without dysuria and continues to use Depend briefs for urinary incontinence.   We reviewed her current medications with her hospital printed medication schedule: Finished her course of Levaquin and has one more dose of prednisone (8/15). She also uses her Trelegy inhaler correctly, which she demonstrated in office today Torsemide 20 mg BID. Reports large volumes of urine.Correct bottle of Entresto 24-26 mg BID She is also taking amlodipine 10 mg daily Eliquis 5 mg BID Trulicity on Saturdays. Did not bring her glucometer, but Alicia [name] tells me that has been waking up in the middle of the night with sweats; has checked her blood sugar and it is in the 40s. For this reason, she has decreased her Lantus to 15 from 30 daily. Has continued doing Lispro 14 with breakfast and lunch, and 20 with dinner. No night awakening episodes or hypoglycemia since she made this change  Was given a prescription for 30 tablets of Zofran, she has been taking one every day the first 3 days after getting home. Has only used a  couple of them since. Discussed with patient the risks of this medication and that if that she needs to use it daily, she should call our office to be evaluated. Described the risks that this would pose to her heart.  Uses simethicone and senokot most days, has been having daily BM. Has been using Atarax sparingly. Couldn't tell me a specific number.   Patient has enough medications to last her through the month. She would prefer to call for medications when they are running out vs sending prescriptions to the the pharmacy. She tells me she gets overwhelmed when the pharmacy tells her she has many bottles ready for her and she doesn't know what they are. She seems to have a very good way to communicate with out office and preferred pharmacy to be able to do this. For simplicity, will send a no-print prescription of Eliquis to ensure she has enough refills as this is a new medication for her. Willd do the same with Torsemide.  Please see problem based assessment and plan for additional details.  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  Non-functioning   Aortic atherosclerosis (HCC) 09/24/2017   Asymptomatic, found on CT scan   Blood transfusion without reported diagnosis    Chronic Systolic Heart Failure 05/10/2014   Felt to be non-ischemic and secondary to hypertension.  Echo (05/28/2014): LVEF 25%.  Is not interested in AICD placement.   COPD exacerbation (HCC)    Coronary artery disease involving native coronary artery of native heart without angina pectoris 11/09/2014   Cardiac cath (02/18/2014): Non-obstructive, mRCA 30%, dRCA 60%   Cystocele with uterine prolapse - grade 3 02/12/2016   Not interested in pessary after trying   Diverticulosis of colon 09/24/2017   Diverticulosis of colon 09/24/2017   Essential hypertension  10/15/2013   Gastroesophageal reflux disease 09/24/2017   History of cerebrovascular accident 05/10/2013   Per patient report 6 previous strokes, most recent on 05/10/13.  No residual deficits.   History of cerebrovascular accident 05/10/2013   Per patient report 6 previous strokes, most recent on 05/10/13.  No residual deficits.   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 (HCC) 10/15/2013    Current Outpatient Medications on File Prior to Visit  Medication Sig Dispense Refill   ACCU-CHEK AVIVA PLUS test strip USE TO TEST BLOOD SUGARS AS DIRECTED (Patient taking differently: 1 each by Other route See admin instructions. USE TO TEST BLOOD SUGARS AS DIRECTED) 100 strip 12   amLODipine (NORVASC) 10 MG tablet TAKE 1 TABLET BY MOUTH EVERY DAY 90 tablet 3   atorvastatin (LIPITOR) 80 MG tablet TAKE 1 TABLET BY MOUTH EVERY DAY 90 tablet 3   B-D UF III MINI PEN NEEDLES 31G X 5 MM MISC USE 3 TIMES A DAY WITH HUMALOG 100 each 5   Dulaglutide (TRULICITY) 1.5 MG/0.5ML SOPN Inject 1.5 mg into the skin once a week. 2 mL 5   ezetimibe (ZETIA) 10 MG tablet Take 1 tablet (10 mg total) by mouth daily. 90 tablet 3   Fluticasone-Umeclidin-Vilant (TRELEGY ELLIPTA) 100-62.5-25 MCG/ACT AEPB Inhale 1 puff into the lungs daily. 60 each 0   guaiFENesin-dextromethorphan (ROBITUSSIN DM) 100-10 MG/5ML syrup Take 10 mLs by mouth at bedtime.     hydrOXYzine (ATARAX) 25 MG tablet Take 1 tablet (25 mg total) by mouth every 6 (six) hours as needed for anxiety. 30 tablet 0   insulin glargine (LANTUS) 100 UNIT/ML Solostar Pen Inject 30 Units into the skin daily. 15 mL  3   insulin lispro (HUMALOG) 100 UNIT/ML KwikPen Inject 14 units before breakfast and lunch, inject 20 units before dinner 15 mL 11   Insulin Pen Needle 32G X 4 MM MISC Use to inject insulin 4 times a day. The patient is insulin requiring, ICD 10 code 11.10. The patient injects 4 times per day. 400 each 3   Insulin Syringe-Needle U-100 31G X 15/64" 0.3 ML MISC Use to inject Humalog before meals three times a day 100 each 5   Lancets (ACCU-CHEK MULTICLIX) lancets Use to check your blood sugar four times daily: early morning, before a meal, two hours after a meal, and bedtime 100 each 12   mupirocin ointment (BACTROBAN) 2 % Place 1 Application into the nose 2 (two) times daily for 11 days. 22 g 0   nystatin powder Apply 1 Application topically 3 (three) times daily. 15 g 2   ondansetron (ZOFRAN) 4 MG tablet Take 1 tablet (4 mg total) by mouth daily as needed for nausea or vomiting.  30 tablet 0   phenol (CHLORASEPTIC) 1.4 % LIQD Use as directed 1 spray in the mouth or throat as needed for throat irritation / pain. 177 mL 0   predniSONE (DELTASONE) 20 MG tablet Beginning on 07/04/23, Take 1 tablet (20 mg total) by mouth daily for 5 days. 5 tablet 0   sacubitril-valsartan (ENTRESTO) 24-26 MG Take 1 tablet by mouth 2 (two) times daily. 60 tablet 0   senna (SENOKOT) 8.6 MG TABS tablet Take 2 tablets (17.2 mg total) by mouth daily. 120 tablet 2   simethicone (MYLICON) 80 MG chewable tablet Chew 1 tablet (80 mg total) by mouth every 6 (six) hours as needed for flatulence (bloating). 30 tablet 0   Wound Dressings (MEDIHONEY WOUND/BURN DRESSING) GEL APPLY TOPICALLY DAILY * OTC ITEM (Patient taking differently: Apply 1 application  topically daily.) 15 mL 0   No current facility-administered medications on file prior to visit.    Family History  Problem Relation Age of Onset   Diabetes Mellitus II Mother    Hypertension Mother    Cerebrovascular Accident Mother 32       Massive, resulted in death    Pulmonary embolism Father 65       Resulted in sudden death   Heart failure Brother    Obesity Brother    Coronary artery disease Brother    Obesity Brother    COPD Brother    Diabetes Mellitus II Brother    Healthy Brother    Healthy Brother    Healthy Daughter    Healthy Son    Healthy Son     Social History   Socioeconomic History   Marital status: Widowed    Spouse name: Not on file   Number of children: 3   Years of education: Not on file   Highest education level: High school graduate  Occupational History   Occupation: Disabled    Comment: Formerly worked in Dentist  Tobacco Use   Smoking status: Former    Current packs/day: 0.00    Average packs/day: 1 pack/day for 40.0 years (40.0 ttl pk-yrs)    Types: Cigarettes    Start date: 09/17/1979    Quit date: 09/17/2019    Years since quitting: 3.8   Smokeless tobacco: Never  Vaping Use   Vaping status: Never Used  Substance and Sexual Activity   Alcohol use: No    Alcohol/week: 0.0 standard drinks of alcohol   Drug use: No   Sexual activity: Not Currently    Birth control/protection: Post-menopausal  Other Topics Concern   Not on file  Social History Narrative   Current Social History 02/25/2021        Patient lives with family in a home which is 2 stories. There are steps up to the entrance the patient uses with handrails.       Patient's method of transportation is personal car.      The highest level of education was some high school.      The patient currently disabled.      Identified important Relationships are "Family, my daughter and grandkids"       Pets : My dog       Interests / Fun: "sleeping, working in my flowerbed        Current Stressors: The war in [place]       Social Determinants of Health   Financial Resource Strain: Low Risk  (06/18/2023)   Overall Financial Resource Strain (CARDIA)  Difficulty of Paying Living Expenses: Not hard at all  Food Insecurity: No Food  Insecurity (06/18/2023)   Hunger Vital Sign    Worried About Running Out of Food in the Last Year: Never true    Ran Out of Food in the Last Year: Never true  Transportation Needs: No Transportation Needs (06/18/2023)   PRAPARE - Administrator, Civil Service (Medical): No    Lack of Transportation (Non-Medical): No  Physical Activity: Inactive (05/19/2023)   Exercise Vital Sign    Days of Exercise per Week: 0 days    Minutes of Exercise per Session: 0 min  Stress: No Stress Concern Present (05/19/2023)   [employer] of Occupational Health - Occupational Stress Questionnaire    Feeling of Stress : Not at all  Social Connections: Socially Isolated (05/19/2023)   Social Connection and Isolation Panel [NHANES]    Frequency of Communication with Friends and Family: More than three times a week    Frequency of Social Gatherings with Friends and Family: Never    Attends Religious Services: Never    Database administrator or Organizations: No    Attends Banker Meetings: Never    Marital Status: Widowed  Intimate Partner Violence: Not At Risk (06/18/2023)   Humiliation, Afraid, Rape, and Kick questionnaire    Fear of Current or Ex-Partner: No    Emotionally Abused: No    Physically Abused: No    Sexually Abused: No    Review of Systems: ROS negative except for what is noted on the assessment and plan.  Objective:   Vitals:   07/07/23 1335 07/07/23 1429  BP: (!) 154/63 (!) 148/57  Pulse: 81 75  Temp: 98.2 F (36.8 C)   TempSrc: Oral   SpO2: 91%   Weight: 209 lb (94.8 kg)   Height: 5\' 7"  (1.702 m)   Supplemental O2 4L  Physical Exam: Constitutional: well-appearing woman sitting in wheelchair, in no acute distress HENT: normocephalic atraumatic, mucous membranes moist Eyes: conjunctiva non-erythematous Neck: supple. Unable to assess JVP Cardiovascular: regular rate and rhythm, no m/r/g, +radial, DP and TP pulses bilaterally Pulmonary/Chest: speaking  in full sentences on supplemental O2, no inreased work of breathing, lungs clear to auscultation bilaterally, no rales or wheezing noted on exam Abdominal: soft, non-tender, non-distended,  no suprapubic tenderness, no sacral edema MSK: No lower extremity edema. No pitting.  Neurological: alert & oriented x 3, answering all questions appropriately Skin: warm and dry Psych: Pleasant mood and affect       07/07/2023    1:36 PM  Depression screen PHQ 2/9  Decreased Interest 0  Down, Depressed, Hopeless 0  PHQ - 2 Score 0  Altered sleeping 0  Tired, decreased energy 0  Change in appetite 0  Feeling bad or failure about yourself  0  Trouble concentrating 0  Moving slowly or fidgety/restless 0  Suicidal thoughts 0  PHQ-9 Score 0  Difficult doing work/chores Not difficult at all     Assessment & Plan:   Heart failure with recovered ejection fraction (HFrecEF) (HCC) Marked improvement from her recent hospitalization in functional status; Asymptomatic today and adherent to medications. No evidence of overt congestion today.   BMP during his visit showed stable renal function and K of 3.8. Discussed with patient's PCP, Dr. Oswaldo Done, and will choose to continue on current dose of Torsemide 40 mg and Entresto 24-26 mg BID. Patient will be re-evaluated  in early September and reassess her renal function and  BP for titration of GDMT -Continue Entresto 24-26 mg every 12 hours Torsemide 40 mg daily Hold Spironolactone -Patient became bradycardic during recent hospitalization on BB. Holding now -  Essential hypertension Above goal during this visit. Given AKI on CKD during recent hospitalization, will monitor for now on current therapy.  -CTM and follow up at next OV for additional therapy  Paroxysmal atrial fibrillation (HCC) New during recent hospitalization. Multiple risk factors, but also thought to be in the setting of acute illness and high doses of B2 agonist therapy.  Patient on [facility] now  and denies blood loss. - Continue Eliquis 5 mg BID - Ensure refill at follow up - Patient has 8/15 visit at Afib clinic  COPD (chronic obstructive pulmonary disease) (HCC) Patient had PFTs in 2019 showeing severe obstructive pulmonary disease and CT scans with evidence of centrilobular emphysema. She is a former smoker.   Patient is about to complete treatment for exacerbation with an prolonged course of prednisone tomorrow. She finished course of Levaquin for HAP. She appears to be at her baseline chronic hypoxic respiratory failure requiring 4L of O2.  -Continue Trelegy therapy -Supplemental O2 -4-5L baseline -8/15 completion of prednisone -Will hold off on repeating PFTs at this time   Type 2 diabetes mellitus with moderate nonproliferative diabetic retinopathy (HCC) Will need review at next OV as patient has self titrated medication for concerns of hypoglycemic episodes. Last A1c:  Lab Results  Component Value Date   HGBA1C 8.2 (A) 05/03/2023  She continues on statin therapy and has enough Lantus and Lispro at home. Continues adherent to GLP1.  Will need repeat A1c at follow up and may benefit from increase in Trulicity if no GI side effects  AKI (acute kidney injury) [facility]) Prior to March 2024, baseline Cr of 1. July urine ACR >3000.   However, given 2 hospitalizations in the past 6 months, difficult to characterize her new CKD baseline.    Latest Ref Rng & Units 07/08/2023    9:21 AM 07/07/2023    2:44 PM 06/28/2023    2:19 AM  BMP  Glucose 70 - 99 mg/dL 332  WILL FOLLOW  P 951   BUN 8 - 27 mg/dL 43  WILL FOLLOW  P 86   Creatinine 0.57 - 1.00 mg/dL 8.84  WILL FOLLOW  P 1.66   BUN/Creat Ratio 12 - 28 34  WILL FOLLOW  P   Sodium 134 - 144 mmol/L 138  WILL FOLLOW  P 129   Potassium 3.5 - 5.2 mmol/L 3.8  WILL FOLLOW  P 4.6   Chloride 96 - 106 mmol/L 94  WILL FOLLOW  P 91   CO2 20 - 29 mmol/L 30  WILL FOLLOW  P 28   Calcium 8.7 - 10.3 mg/dL 7.8  WILL FOLLOW  P 8.9     P  Preliminary result    Today, her kidney function has been stable. Will need to continue monitoring at follow up for improvement in GFR and Cr.   Nausea Has 25 tablets of Zofran given at discharge. Reviewed medication list and it is not a chronic medication. Discussed with patient risks associated with persistent use of this drug in general and in people with her hx of heart disease. Patient expressed understanding and will call office if needs to take this medication persistently for more than 2 days in row.   Chronic anemia Chronic, however, with recent CBCs with Hgb in 9-10 range. Normocitic and normal RDW. However, given history of  iron deficiency, will order iron studies, which I will follow up.    Return in about 4 weeks (around 08/04/2023) for diabetes and hypertension follow up.  Patient discussed with Dr. Koren Bound, MD [facility] Program - PGY-2 07/08/2023,

## 2023-07-07 NOTE — Patient Outreach (Signed)
  Care Coordination   Follow Up Visit Note   07/07/2023 Name: Alicia [name] MRN: [id number] DOB: [birth year]  Alicia [name] is a 67 y.o. year old female who sees Oswaldo Done, Marquita Palms, MD for primary care. I spoke with  Kenniah Penrod Maclin by phone today.  What matters to the patients health and wellness today?  I spoke with Mrs. Eberly about home health and her shower bench. She stated that Frances Furbish came out and did their assessment today. Additionally, someone helped fix the shower bench, and now it fits properly.     Goals Addressed             This Visit's Progress    COMPLETED: I feel better after leaving the hospital       Care Coordination Interventions: Care coordination needs related to post discharge instructions, including medication review, MD follow up review, and PT/OT follow up. Reviewed medications with patient and discussed Levaquin Prednisone Elaquis and Entresto Reviewed scheduled/upcoming provider appointments including: Lonia Skinner 07/06/21  [facility] 07/14/23 930 am Beth Np,  [facility] 07/14/23 130 pm [facility] called Follow up on DME shower bench was adjusted to fit bath tub. -Home health contacted the patient          SDOH assessments and interventions completed:  No     Care Coordination Interventions:  Yes, provided   Interventions Today    Flowsheet Row Most Recent Value  Chronic Disease   Chronic disease during today's visit --  [Acute on chronic hypoxic respiratory failure]  General Interventions   General Interventions Discussed/Reviewed General Interventions Discussed        Follow up plan: Follow up call scheduled for 08/11/23 145    Encounter Outcome:  Pt. Visit Completed   [name] RN, BSN, [facility] Care Coordinator [facility]   Phone: [phone]

## 2023-07-07 NOTE — Patient Instructions (Signed)
Visit Information  Thank you for taking time to visit with me today. Please don't hesitate to contact me if I can be of assistance to you.   Following are the goals we discussed today:   Goals Addressed             This Visit's Progress    COMPLETED: I feel better after leaving the hospital       Care Coordination Interventions: Care coordination needs related to post discharge instructions, including medication review, MD follow up review, and PT/OT follow up. Reviewed medications with patient and discussed Levaquin Prednisone Elaquis and Entresto Reviewed scheduled/upcoming provider appointments including: Lonia Skinner 07/06/21  [facility] 07/14/23 930 am Beth Np,  [facility] 07/14/23 130 pm [facility] called Follow up on DME shower bench was adjusted to fit bath tub. -Home health contacted the patient          Our next appointment is by telephone on 08/11/23 at 145  Please call the care guide team at [phone] if you need to cancel or reschedule your appointment.   If you are experiencing a Mental Health or Behavioral Health Crisis or need someone to talk to, please call 1-800-273-TALK (toll free, 24 hour hotline)  Patient verbalizes understanding of instructions and care plan provided today and agrees to view in MyChart. Active MyChart status and patient understanding of how to access instructions and care plan via MyChart confirmed with patient.     [name] RN, BSN, [facility] Care Coordinator [facility]   Phone: [phone]

## 2023-07-08 ENCOUNTER — Ambulatory Visit: Payer: 59 | Attending: Nurse Practitioner | Admitting: Nurse Practitioner

## 2023-07-08 ENCOUNTER — Other Ambulatory Visit: Payer: Self-pay | Admitting: Student in an Organized Health Care Education/Training Program

## 2023-07-08 ENCOUNTER — Encounter: Payer: Self-pay | Admitting: Student

## 2023-07-08 ENCOUNTER — Telehealth: Payer: Self-pay

## 2023-07-08 ENCOUNTER — Encounter: Payer: Self-pay | Admitting: Nurse Practitioner

## 2023-07-08 VITALS — BP 162/68 | HR 78 | Ht 67.0 in | Wt 213.4 lb

## 2023-07-08 DIAGNOSIS — R110 Nausea: Secondary | ICD-10-CM | POA: Insufficient documentation

## 2023-07-08 DIAGNOSIS — N179 Acute kidney failure, unspecified: Secondary | ICD-10-CM

## 2023-07-08 DIAGNOSIS — I10 Essential (primary) hypertension: Secondary | ICD-10-CM | POA: Diagnosis not present

## 2023-07-08 DIAGNOSIS — J439 Emphysema, unspecified: Secondary | ICD-10-CM | POA: Diagnosis not present

## 2023-07-08 DIAGNOSIS — I480 Paroxysmal atrial fibrillation: Secondary | ICD-10-CM

## 2023-07-08 DIAGNOSIS — I5043 Acute on chronic combined systolic (congestive) and diastolic (congestive) heart failure: Secondary | ICD-10-CM

## 2023-07-08 DIAGNOSIS — E113393 Type 2 diabetes mellitus with moderate nonproliferative diabetic retinopathy without macular edema, bilateral: Secondary | ICD-10-CM

## 2023-07-08 LAB — BASIC METABOLIC PANEL
BUN/Creatinine Ratio: 34 — ABNORMAL HIGH (ref 12–28)
BUN: 43 mg/dL — ABNORMAL HIGH (ref 8–27)
CO2: 30 mmol/L — ABNORMAL HIGH (ref 20–29)
Calcium: 7.8 mg/dL — ABNORMAL LOW (ref 8.7–10.3)
Chloride: 94 mmol/L — ABNORMAL LOW (ref 96–106)
Creatinine, Ser: 1.28 mg/dL — ABNORMAL HIGH (ref 0.57–1.00)
Glucose: 171 mg/dL — ABNORMAL HIGH (ref 70–99)
Potassium: 3.8 mmol/L (ref 3.5–5.2)
Sodium: 138 mmol/L (ref 134–144)
eGFR: 46 mL/min/{1.73_m2} — ABNORMAL LOW (ref >59)

## 2023-07-08 MED ORDER — APIXABAN 5 MG PO TABS: 5.0000 mg | ORAL_TABLET | Freq: Two times a day (BID) | ORAL | Status: DC

## 2023-07-08 MED ORDER — TORSEMIDE 20 MG PO TABS
40.0000 mg | ORAL_TABLET | Freq: Every day | ORAL | 11 refills | Status: DC
Start: 2023-07-08 — End: 2023-07-09

## 2023-07-08 MED ORDER — NYSTATIN 100000 UNIT/ML MT SUSP: 400000.0000 [IU] | Freq: Four times a day (QID) | OROMUCOSAL | 0 refills | Status: AC

## 2023-07-08 NOTE — Assessment & Plan Note (Signed)
Marked improvement from her recent hospitalization in functional status; Asymptomatic today and adherent to medications. No evidence of overt congestion today.   BMP during his visit showed stable renal function and K of 3.8. Discussed with patient's PCP, Dr. Oswaldo Done, and will choose to continue on current dose of Torsemide 40 mg and Entresto 24-26 mg BID. Patient will be re-evaluated  in early September and reassess her renal function and BP for titration of GDMT -Continue Entresto 24-26 mg every 12 hours Torsemide 40 mg daily Hold Spironolactone -Patient became bradycardic during recent hospitalization on BB. Holding now -

## 2023-07-08 NOTE — Assessment & Plan Note (Signed)
Chronic, however, with recent CBCs with Hgb in 9-10 range. Normocitic and normal RDW. However, given history of iron deficiency, will order iron studies, which I will follow up.

## 2023-07-08 NOTE — Assessment & Plan Note (Signed)
Above goal during this visit. Given AKI on CKD during recent hospitalization, will monitor for now on current therapy.  -CTM and follow up at next OV for additional therapy

## 2023-07-08 NOTE — Assessment & Plan Note (Addendum)
Prior to March 2024, baseline Cr of 1. July urine ACR >3000.   However, given 2 hospitalizations in the past 6 months, difficult to characterize her new CKD baseline.    Latest Ref Rng & Units 07/08/2023    9:21 AM 07/07/2023    2:44 PM 06/28/2023    2:19 AM  BMP  Glucose 70 - 99 mg/dL 782  WILL FOLLOW  P 956   BUN 8 - 27 mg/dL 43  WILL FOLLOW  P 86   Creatinine 0.57 - 1.00 mg/dL 2.13  WILL FOLLOW  P 0.86   BUN/Creat Ratio 12 - 28 34  WILL FOLLOW  P   Sodium 134 - 144 mmol/L 138  WILL FOLLOW  P 129   Potassium 3.5 - 5.2 mmol/L 3.8  WILL FOLLOW  P 4.6   Chloride 96 - 106 mmol/L 94  WILL FOLLOW  P 91   CO2 20 - 29 mmol/L 30  WILL FOLLOW  P 28   Calcium 8.7 - 10.3 mg/dL 7.8  WILL FOLLOW  P 8.9     P Preliminary result    Today, her kidney function has been stable. Will need to continue monitoring at follow up for improvement in GFR and Cr.

## 2023-07-08 NOTE — Assessment & Plan Note (Addendum)
Will need review at next OV as patient has self titrated medication for concerns of hypoglycemic episodes. Last A1c:  Lab Results  Component Value Date   HGBA1C 8.2 (A) 05/03/2023  She continues on statin therapy and has enough Lantus and Lispro at home. Continues adherent to GLP1.  Will need repeat A1c at follow up and may benefit from increase in Trulicity if no GI side effects

## 2023-07-08 NOTE — Assessment & Plan Note (Signed)
Has 25 tablets of Zofran given at discharge. Reviewed medication list and it is not a chronic medication. Discussed with patient risks associated with persistent use of this drug in general and in people with her hx of heart disease. Patient expressed understanding and will call office if needs to take this medication persistently for more than 2 days in row.

## 2023-07-08 NOTE — Patient Instructions (Addendum)
Medication Instructions:  START Nystatin 400000 take 4mL four times a day. Swish in the mouth and retain for as long as possible (several minutes) before swallowing  *If you need a refill on your cardiac medications before your next appointment, please call your pharmacy*   Lab Work: TODAY-BMET If you have labs (blood work) drawn today and your tests are completely normal, you will receive your results only by: MyChart Message (if you have MyChart) OR A paper copy in the mail If you have any lab test that is abnormal or we need to change your treatment, we will call you to review the results.   Testing/Procedures: NONE ORDERED   Follow-Up: At [facility], you and your health needs are our priority.  As part of our continuing mission to provide you with exceptional heart care, we have created designated Provider Care Teams.  These Care Teams include your primary Cardiologist (physician) and Advanced Practice Providers (APPs -  Physician Assistants and Nurse Practitioners) who all work together to provide you with the care you need, when you need it.  We recommend signing up for the patient portal called "MyChart".  Sign up information is provided on this After Visit Summary.  MyChart is used to connect with patients for Virtual Visits (Telemedicine).  Patients are able to view lab/test results, encounter notes, upcoming appointments, etc.  Non-urgent messages can be sent to your provider as well.   To learn more about what you can do with MyChart, go to [email or web address].    Your next appointment:   2 week(s)  Provider:   Robin Searing, NP       Other Instructions: Check your blood pressure daily for 1 weeks, then contact the office with your readings.  Make sure to check 2 hours after your medications.   AVOID these things for 30 minutes before checking your blood pressure: No Drinking caffeine. No Drinking alcohol. No Eating. No Smoking. No Exercising.  Five  minutes before checking your blood pressure: Pee. Sit in a dining chair. Avoid sitting in a soft couch or armchair. Be quiet. Do not talk.

## 2023-07-08 NOTE — Telephone Encounter (Signed)
Alicia [name] with [name] want to informed the office, pt would like to start hh pt next week instead this week. Pt states she has too many doctor appt for this week.

## 2023-07-08 NOTE — Assessment & Plan Note (Signed)
New during recent hospitalization. Multiple risk factors, but also thought to be in the setting of acute illness and high doses of B2 agonist therapy.  Patient on [facility] now and denies blood loss. - Continue Eliquis 5 mg BID - Ensure refill at follow up - Patient has 8/15 visit at [facility] clinic

## 2023-07-08 NOTE — Telephone Encounter (Signed)
Ok. Thank you, that is fine.

## 2023-07-08 NOTE — Assessment & Plan Note (Signed)
Patient had PFTs in 2019 showeing severe obstructive pulmonary disease and CT scans with evidence of centrilobular emphysema. She is a former smoker.   Patient is about to complete treatment for exacerbation with an prolonged course of prednisone tomorrow. She finished course of Levaquin for HAP. She appears to be at her baseline chronic hypoxic respiratory failure requiring 4L of O2.  -Continue Trelegy therapy -Supplemental O2 -4-5L baseline -8/15 completion of prednisone -Will hold off on repeating PFTs at this time

## 2023-07-09 ENCOUNTER — Telehealth: Payer: Self-pay

## 2023-07-09 DIAGNOSIS — I5020 Unspecified systolic (congestive) heart failure: Secondary | ICD-10-CM

## 2023-07-09 DIAGNOSIS — I5032 Chronic diastolic (congestive) heart failure: Secondary | ICD-10-CM

## 2023-07-09 DIAGNOSIS — I5043 Acute on chronic combined systolic (congestive) and diastolic (congestive) heart failure: Secondary | ICD-10-CM

## 2023-07-09 MED ORDER — TORSEMIDE 40 MG PO TABS: 40.0000 mg | ORAL_TABLET | Freq: Two times a day (BID) | ORAL | 0 refills | Status: DC

## 2023-07-09 NOTE — Telephone Encounter (Signed)
  Gaston Islam., NP  You1 hour ago (12:51 PM)    Please advise patient to continue to monitor and to take her current dose of torsemide 40 mg twice a day.  Please let us know if she notices increased weight gain of 2 pounds in 24 hours or 5 pounds in 1 week.  Please let us know if you have any further questions.  Robin Searing, NP

## 2023-07-09 NOTE — Telephone Encounter (Signed)
Spoke with the patient and she states she checked her weight a few minutes ago and her she has gained 4 lbs from yesterdays weight.   Patient states she wants to make Alden Server aware and see what his recommendations are. I asked the patient if she had weighted her self on her scale prior and she has not. I told her I'm not sure if our scales measure the same but ill still inform Alden Server. Patient states she did take the torsemide today.

## 2023-07-09 NOTE — Telephone Encounter (Signed)
Patient has been notified directly; all questions, if any, were answered. Patient voiced understanding.   

## 2023-07-10 LAB — CBC
Hematocrit: 31.4 % — ABNORMAL LOW (ref 34.0–46.6)
Hemoglobin: 10.1 g/dL — ABNORMAL LOW (ref 11.1–15.9)
MCH: 26.4 pg — ABNORMAL LOW (ref 26.6–33.0)
MCHC: 32.2 g/dL (ref 31.5–35.7)
MCV: 82 fL (ref 79–97)
Platelets: 261 10*3/uL (ref 150–450)
RBC: 3.82 x10E6/uL (ref 3.77–5.28)
RDW: 15.1 % (ref 11.7–15.4)
WBC: 15 10*3/uL — ABNORMAL HIGH (ref 3.4–10.8)

## 2023-07-10 LAB — BMP8+ANION GAP
Anion Gap: 17 mmol/L (ref 10.0–18.0)
BUN/Creatinine Ratio: 34 — ABNORMAL HIGH (ref 12–28)
BUN: 47 mg/dL — ABNORMAL HIGH (ref 8–27)
CO2: 28 mmol/L (ref 20–29)
Calcium: 7.7 mg/dL — ABNORMAL LOW (ref 8.7–10.3)
Chloride: 94 mmol/L — ABNORMAL LOW (ref 96–106)
Creatinine, Ser: 1.38 mg/dL — ABNORMAL HIGH (ref 0.57–1.00)
Glucose: 323 mg/dL — ABNORMAL HIGH (ref 70–99)
Potassium: 4.6 mmol/L (ref 3.5–5.2)
Sodium: 139 mmol/L (ref 134–144)
eGFR: 42 mL/min/{1.73_m2} — ABNORMAL LOW (ref >59)

## 2023-07-10 LAB — IRON,TIBC AND FERRITIN PANEL
Ferritin: 290 ng/mL — ABNORMAL HIGH (ref 15–150)
Iron Saturation: 25 % (ref 15–55)
Iron: 57 ug/dL (ref 27–139)
Total Iron Binding Capacity: 231 ug/dL — ABNORMAL LOW (ref 250–450)
UIBC: 174 ug/dL (ref 118–369)

## 2023-07-12 DIAGNOSIS — I3139 Other pericardial effusion (noninflammatory): Secondary | ICD-10-CM | POA: Diagnosis not present

## 2023-07-12 DIAGNOSIS — I5043 Acute on chronic combined systolic (congestive) and diastolic (congestive) heart failure: Secondary | ICD-10-CM | POA: Diagnosis not present

## 2023-07-12 DIAGNOSIS — Z7952 Long term (current) use of systemic steroids: Secondary | ICD-10-CM | POA: Diagnosis not present

## 2023-07-12 DIAGNOSIS — S3092XD Unspecified superficial injury of abdominal wall, subsequent encounter: Secondary | ICD-10-CM | POA: Diagnosis not present

## 2023-07-12 DIAGNOSIS — E119 Type 2 diabetes mellitus without complications: Secondary | ICD-10-CM | POA: Diagnosis not present

## 2023-07-12 DIAGNOSIS — Z7985 Long-term (current) use of injectable non-insulin antidiabetic drugs: Secondary | ICD-10-CM | POA: Diagnosis not present

## 2023-07-12 DIAGNOSIS — Z7951 Long term (current) use of inhaled steroids: Secondary | ICD-10-CM | POA: Diagnosis not present

## 2023-07-12 DIAGNOSIS — J9811 Atelectasis: Secondary | ICD-10-CM | POA: Diagnosis not present

## 2023-07-12 DIAGNOSIS — I081 Rheumatic disorders of both mitral and tricuspid valves: Secondary | ICD-10-CM | POA: Diagnosis not present

## 2023-07-12 DIAGNOSIS — Z9181 History of falling: Secondary | ICD-10-CM | POA: Diagnosis not present

## 2023-07-12 DIAGNOSIS — B9689 Other specified bacterial agents as the cause of diseases classified elsewhere: Secondary | ICD-10-CM | POA: Diagnosis not present

## 2023-07-12 DIAGNOSIS — E785 Hyperlipidemia, unspecified: Secondary | ICD-10-CM | POA: Diagnosis not present

## 2023-07-12 DIAGNOSIS — Z9981 Dependence on supplemental oxygen: Secondary | ICD-10-CM | POA: Diagnosis not present

## 2023-07-12 DIAGNOSIS — D649 Anemia, unspecified: Secondary | ICD-10-CM | POA: Diagnosis not present

## 2023-07-12 DIAGNOSIS — J441 Chronic obstructive pulmonary disease with (acute) exacerbation: Secondary | ICD-10-CM | POA: Diagnosis not present

## 2023-07-12 DIAGNOSIS — J440 Chronic obstructive pulmonary disease with acute lower respiratory infection: Secondary | ICD-10-CM | POA: Diagnosis not present

## 2023-07-12 DIAGNOSIS — J9621 Acute and chronic respiratory failure with hypoxia: Secondary | ICD-10-CM | POA: Diagnosis not present

## 2023-07-12 DIAGNOSIS — I2510 Atherosclerotic heart disease of native coronary artery without angina pectoris: Secondary | ICD-10-CM | POA: Diagnosis not present

## 2023-07-12 DIAGNOSIS — Z7901 Long term (current) use of anticoagulants: Secondary | ICD-10-CM | POA: Diagnosis not present

## 2023-07-12 DIAGNOSIS — I110 Hypertensive heart disease with heart failure: Secondary | ICD-10-CM | POA: Diagnosis not present

## 2023-07-12 DIAGNOSIS — I480 Paroxysmal atrial fibrillation: Secondary | ICD-10-CM | POA: Diagnosis not present

## 2023-07-12 DIAGNOSIS — Z794 Long term (current) use of insulin: Secondary | ICD-10-CM | POA: Diagnosis not present

## 2023-07-12 DIAGNOSIS — E874 Mixed disorder of acid-base balance: Secondary | ICD-10-CM | POA: Diagnosis not present

## 2023-07-13 ENCOUNTER — Telehealth: Payer: Self-pay | Admitting: Nurse Practitioner

## 2023-07-13 DIAGNOSIS — Z7901 Long term (current) use of anticoagulants: Secondary | ICD-10-CM | POA: Diagnosis not present

## 2023-07-13 DIAGNOSIS — E874 Mixed disorder of acid-base balance: Secondary | ICD-10-CM | POA: Diagnosis not present

## 2023-07-13 DIAGNOSIS — I2510 Atherosclerotic heart disease of native coronary artery without angina pectoris: Secondary | ICD-10-CM | POA: Diagnosis not present

## 2023-07-13 DIAGNOSIS — S3092XD Unspecified superficial injury of abdominal wall, subsequent encounter: Secondary | ICD-10-CM | POA: Diagnosis not present

## 2023-07-13 DIAGNOSIS — I5043 Acute on chronic combined systolic (congestive) and diastolic (congestive) heart failure: Secondary | ICD-10-CM | POA: Diagnosis not present

## 2023-07-13 DIAGNOSIS — Z7985 Long-term (current) use of injectable non-insulin antidiabetic drugs: Secondary | ICD-10-CM | POA: Diagnosis not present

## 2023-07-13 DIAGNOSIS — D649 Anemia, unspecified: Secondary | ICD-10-CM | POA: Diagnosis not present

## 2023-07-13 DIAGNOSIS — E785 Hyperlipidemia, unspecified: Secondary | ICD-10-CM | POA: Diagnosis not present

## 2023-07-13 DIAGNOSIS — I480 Paroxysmal atrial fibrillation: Secondary | ICD-10-CM | POA: Diagnosis not present

## 2023-07-13 DIAGNOSIS — Z7951 Long term (current) use of inhaled steroids: Secondary | ICD-10-CM | POA: Diagnosis not present

## 2023-07-13 DIAGNOSIS — Z9981 Dependence on supplemental oxygen: Secondary | ICD-10-CM | POA: Diagnosis not present

## 2023-07-13 DIAGNOSIS — E119 Type 2 diabetes mellitus without complications: Secondary | ICD-10-CM | POA: Diagnosis not present

## 2023-07-13 DIAGNOSIS — I081 Rheumatic disorders of both mitral and tricuspid valves: Secondary | ICD-10-CM | POA: Diagnosis not present

## 2023-07-13 DIAGNOSIS — Z7952 Long term (current) use of systemic steroids: Secondary | ICD-10-CM | POA: Diagnosis not present

## 2023-07-13 DIAGNOSIS — J440 Chronic obstructive pulmonary disease with acute lower respiratory infection: Secondary | ICD-10-CM | POA: Diagnosis not present

## 2023-07-13 DIAGNOSIS — B9689 Other specified bacterial agents as the cause of diseases classified elsewhere: Secondary | ICD-10-CM | POA: Diagnosis not present

## 2023-07-13 DIAGNOSIS — J9811 Atelectasis: Secondary | ICD-10-CM | POA: Diagnosis not present

## 2023-07-13 DIAGNOSIS — Z9181 History of falling: Secondary | ICD-10-CM | POA: Diagnosis not present

## 2023-07-13 DIAGNOSIS — J441 Chronic obstructive pulmonary disease with (acute) exacerbation: Secondary | ICD-10-CM | POA: Diagnosis not present

## 2023-07-13 DIAGNOSIS — J9621 Acute and chronic respiratory failure with hypoxia: Secondary | ICD-10-CM | POA: Diagnosis not present

## 2023-07-13 DIAGNOSIS — I3139 Other pericardial effusion (noninflammatory): Secondary | ICD-10-CM | POA: Diagnosis not present

## 2023-07-13 DIAGNOSIS — Z794 Long term (current) use of insulin: Secondary | ICD-10-CM | POA: Diagnosis not present

## 2023-07-13 DIAGNOSIS — I110 Hypertensive heart disease with heart failure: Secondary | ICD-10-CM | POA: Diagnosis not present

## 2023-07-13 NOTE — Telephone Encounter (Signed)
Pt c/o BP issue:  1. What are your last 5 BP readings? 133/66 at 5:00 this morning yesterday 163/60- Sunday 174/64 Saturday 161/74   Standre 157/68 2. Are you having any other symptoms (ex. Dizziness, headache, blurred vision, passed out)?  Head aching a little  3. What is your medication issue? Patient said Alden Server told her to call back with her blood pressure readings. Her blood pressure have been running high

## 2023-07-13 NOTE — Telephone Encounter (Signed)
Called the patient.   BP this am was 133/66.  All the readings listed were before she takes meds.  Adv her to sit quiet for 5 min, feet flat on floor and then check BP --3-4 hours after taking medications.  She will do this for about the next 7-10 days and then call back and report readings.   Pt appreciative for assistance.

## 2023-07-14 ENCOUNTER — Ambulatory Visit: Payer: 59

## 2023-07-14 ENCOUNTER — Encounter (HOSPITAL_COMMUNITY): Payer: Self-pay

## 2023-07-14 ENCOUNTER — Ambulatory Visit (INDEPENDENT_AMBULATORY_CARE_PROVIDER_SITE_OTHER): Payer: 59 | Admitting: Primary Care

## 2023-07-14 ENCOUNTER — Ambulatory Visit (HOSPITAL_COMMUNITY)
Admit: 2023-07-14 | Discharge: 2023-07-14 | Disposition: A | Discharge status: Home / Self Care | Payer: 59 | Attending: Adult Health | Admitting: Adult Health

## 2023-07-14 ENCOUNTER — Encounter: Payer: Self-pay | Admitting: Primary Care

## 2023-07-14 VITALS — BP 157/86 | HR 84 | Wt 219.0 lb

## 2023-07-14 VITALS — BP 140/68 | HR 72 | Temp 98.8°F | Ht 67.0 in | Wt 220.0 lb

## 2023-07-14 DIAGNOSIS — Z87891 Personal history of nicotine dependence: Secondary | ICD-10-CM | POA: Insufficient documentation

## 2023-07-14 DIAGNOSIS — E119 Type 2 diabetes mellitus without complications: Secondary | ICD-10-CM | POA: Insufficient documentation

## 2023-07-14 DIAGNOSIS — I5032 Chronic diastolic (congestive) heart failure: Secondary | ICD-10-CM | POA: Insufficient documentation

## 2023-07-14 DIAGNOSIS — Z9981 Dependence on supplemental oxygen: Secondary | ICD-10-CM | POA: Insufficient documentation

## 2023-07-14 DIAGNOSIS — J189 Pneumonia, unspecified organism: Secondary | ICD-10-CM

## 2023-07-14 DIAGNOSIS — Y95 Nosocomial condition: Secondary | ICD-10-CM | POA: Insufficient documentation

## 2023-07-14 DIAGNOSIS — J9611 Chronic respiratory failure with hypoxia: Secondary | ICD-10-CM | POA: Diagnosis not present

## 2023-07-14 DIAGNOSIS — J441 Chronic obstructive pulmonary disease with (acute) exacerbation: Secondary | ICD-10-CM | POA: Diagnosis not present

## 2023-07-14 DIAGNOSIS — Z79899 Other long term (current) drug therapy: Secondary | ICD-10-CM | POA: Insufficient documentation

## 2023-07-14 DIAGNOSIS — J449 Chronic obstructive pulmonary disease, unspecified: Secondary | ICD-10-CM | POA: Diagnosis not present

## 2023-07-14 DIAGNOSIS — I10 Essential (primary) hypertension: Secondary | ICD-10-CM

## 2023-07-14 DIAGNOSIS — J9610 Chronic respiratory failure, unspecified whether with hypoxia or hypercapnia: Secondary | ICD-10-CM | POA: Insufficient documentation

## 2023-07-14 DIAGNOSIS — I110 Hypertensive heart disease with heart failure: Secondary | ICD-10-CM | POA: Diagnosis not present

## 2023-07-14 DIAGNOSIS — N179 Acute kidney failure, unspecified: Secondary | ICD-10-CM | POA: Diagnosis not present

## 2023-07-14 MED ORDER — TRELEGY ELLIPTA 200-62.5-25 MCG/ACT IN AEPB: 1.0000 | INHALATION_SPRAY | Freq: Every morning | RESPIRATORY_TRACT | Status: DC

## 2023-07-14 MED ORDER — TRELEGY ELLIPTA 200-62.5-25 MCG/ACT IN AEPB: 1.0000 | INHALATION_SPRAY | Freq: Every day | RESPIRATORY_TRACT | 5 refills | Status: DC

## 2023-07-14 MED ORDER — TORSEMIDE 20 MG PO TABS: ORAL_TABLET | ORAL | 1 refills | Status: DC

## 2023-07-14 NOTE — Addendum Note (Signed)
Encounter addended by: Sherald Hess, NP on: 07/14/2023 2:03 PM  Actions taken: Level of Service modified

## 2023-07-14 NOTE — Assessment & Plan Note (Signed)
-   Emphysema noted on CT - Oredered for PFT - Change trelegy 200

## 2023-07-14 NOTE — Progress Notes (Signed)
@Patient  ID: Alicia [name], female    DOB: January 18, 1956, 67 y.o.   MRN: [id number]  Chief Complaint  Patient presents with   Hospitalization Follow-up    Referring provider: [name]  HPI: 67 year old female, former smoker quit in 2020 (40-pack-year history).  Past medical history significant for hypertension, heart failure with preserved ejection fraction, A-fib, coronary artery disease, SA node dysfunction, COPD, chronic respiratory failure, nocturnal hypoxemia, GERD, type 2 diabetes, hyperlipidemia, chronic anemia, obesity.   07/14/2023 Patient presents today for hospital follow-up.  Admitted from 06/15/2023 - 06/28/2023 for acute on chronic hypoxemic respiratory failure due to COPD exacerbation and acute decompensated heart failure.  Spittle course was complicated by acute kidney injury, new A-fib with RVR and rebound of acute hypoxic respiratory failure secondary to hospital-acquired pneumonia and atelectasis.  Patient was discharged on Levaquin and prednisone for HAP.  Beta-blocker was discontinued on discharge due to bradycardia. Patient is O2 dependent on 4 to 5 L of oxygen at home. She will need outpatient pulmonary function testing and possible pulmonary rehab.  She is doing alright, feeling better. She is happy to be home. She was not aware that she had pneumonia. She did complete Levaquin course and completed prednisone taper. She is using Trelegy daily in the morning. She has a congested cough with clear mucus along with PND/sneezing symptoms.    Allergies  Allergen Reactions   Canagliflozin Itching, Anxiety and Palpitations    Immunization History  Administered Date(s) Administered   Fluad Quad(high Dose 65+) 02/09/2022   Influenza,inj,Quad PF,6+ Mos 10/16/2013, 10/09/2014, 08/14/2015, 08/16/2017, 08/12/2018, 09/04/2019, 08/17/2022   PFIZER(Purple Top)SARS-COV-2 Vaccination 07/26/2020, 08/23/2020   Pneumococcal Conjugate-13 10/28/2018   Pneumococcal  Polysaccharide-23 05/29/2019   Pneumococcal-Unspecified 02/21/2014   Tdap 09/24/2017    Past Medical History:  Diagnosis Date   Abscess of skin of abdomen 08/31/2018   Acquired lactose intolerance 09/24/2017   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  Non-functioning   Aortic atherosclerosis (HCC) 09/24/2017   Asymptomatic, found on CT scan   Blood transfusion without reported diagnosis    Chronic Systolic Heart Failure 05/10/2014   Felt to be non-ischemic and secondary to hypertension.  Echo (05/28/2014): LVEF 25%.  Is not interested in AICD placement.   COPD exacerbation (HCC)    Coronary artery disease involving native coronary artery of native heart without angina pectoris 11/09/2014   Cardiac cath (02/18/2014): Non-obstructive, mRCA 30%, dRCA 60%   Cystocele with uterine prolapse - grade 3 02/12/2016   Not interested in pessary after trying   Diverticulosis of colon 09/24/2017   Diverticulosis of colon 09/24/2017   Essential hypertension 10/15/2013   Gastroesophageal reflux disease 09/24/2017   History of cerebrovascular accident 05/10/2013   Per patient report 6 previous strokes, most recent on 05/10/13.  No residual deficits.   History of cerebrovascular accident 05/10/2013   Per patient report 6 previous strokes, most recent on 05/10/13.  No residual deficits.   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 (HCC) 10/15/2013    Tobacco History: Social History   Tobacco Use  Smoking Status  Former   Current packs/day: 0.00   Average packs/day: 1 pack/day for 40.0 years  (40.0 ttl pk-yrs)   Types: Cigarettes   Start date: 09/17/1979   Quit date: 09/17/2019   Years since quitting: 3.8  Smokeless Tobacco Never   Counseling given: Not Answered   Outpatient Medications Prior to Visit  Medication Sig Dispense Refill   ACCU-CHEK AVIVA PLUS test strip USE TO TEST BLOOD SUGARS AS DIRECTED (Patient taking differently: 1 each by Other route See admin instructions. USE TO TEST BLOOD SUGARS AS DIRECTED) 100 strip 12   amLODipine (NORVASC) 10 MG tablet TAKE 1 TABLET BY MOUTH EVERY DAY 90 tablet 3   apixaban (ELIQUIS) 5 MG TABS tablet Take 1 tablet (5 mg total) by mouth 2 (two) times daily.     atorvastatin (LIPITOR) 80 MG tablet TAKE 1 TABLET BY MOUTH EVERY DAY 90 tablet 3   B-D UF III MINI PEN NEEDLES 31G X 5 MM MISC USE 3 TIMES A DAY WITH HUMALOG 100 each 5   Dulaglutide (TRULICITY) 1.5 MG/0.5ML SOPN Inject 1.5 mg into the skin once a week. 2 mL 5   ENTRESTO 97-103 MG Take 1 tablet by mouth 2 (two) times daily.     ezetimibe (ZETIA) 10 MG tablet Take 1 tablet (10 mg total) by mouth daily. 90 tablet 3   guaiFENesin-dextromethorphan (ROBITUSSIN DM) 100-10 MG/5ML syrup Take 10 mLs by mouth at bedtime.     hydrOXYzine (ATARAX) 25 MG tablet Take 1 tablet (25 mg total) by mouth every 6 (six) hours as needed for anxiety. 30 tablet 0   insulin glargine (LANTUS) 100 UNIT/ML Solostar Pen Inject 30 Units into the skin daily. 15 mL 3   insulin lispro (HUMALOG) 100 UNIT/ML KwikPen Inject 14 units before breakfast and lunch, inject 20 units before dinner 15 mL 11   Insulin Pen Needle 32G X 4 MM MISC Use to inject insulin 4 times a day. The patient is insulin requiring, ICD 10 code 11.10. The patient injects 4 times per day. 400 each 3   Insulin Syringe-Needle U-100 31G X 15/64" 0.3 ML MISC Use to inject Humalog before meals three times a day 100 each 5   Lancets (ACCU-CHEK MULTICLIX) lancets Use to check your blood sugar four times daily: early morning, before a meal, two hours  after a meal, and bedtime 100 each 12   nystatin (MYCOSTATIN) 100000 UNIT/ML suspension Take 4 mLs (400,000 Units total) by mouth 4 (four) times daily for 7 days. swish in the mouth and retain for as long as possible (several minutes) before swallowing 60 mL 0   nystatin powder Apply 1 Application topically 3 (three) times daily. 15 g 2   ondansetron (ZOFRAN) 4 MG tablet Take 1 tablet (4 mg total) by mouth daily as needed for nausea or vomiting. 30 tablet 0   phenol (CHLORASEPTIC) 1.4 % LIQD Use as directed 1 spray in the mouth or throat as needed for throat irritation / pain. 177 mL 0   sacubitril-valsartan (ENTRESTO) 24-26 MG Take 1 tablet by mouth 2 (two) times daily. 60 tablet 0   senna (SENOKOT) 8.6 MG TABS tablet Take 2 tablets (17.2 mg total) by mouth daily. 120 tablet 2   simethicone (MYLICON) 80 MG chewable tablet Chew 1 tablet (80 mg total) by mouth every 6 (six) hours as needed for flatulence (bloating). 30 tablet 0   spironolactone (ALDACTONE) 25 MG tablet Take 12.5 mg by mouth daily.     torsemide 40 MG TABS  Take 40 mg by mouth 2 (two) times daily. 60 tablet 0   Wound Dressings (MEDIHONEY WOUND/BURN DRESSING) GEL APPLY TOPICALLY DAILY * OTC ITEM (Patient taking differently: Apply 1 application  topically daily.) 15 mL 0   Fluticasone-Umeclidin-Vilant (TRELEGY ELLIPTA) 100-62.5-25 MCG/ACT AEPB Inhale 1 puff into the lungs daily. 60 each 0   No facility-administered medications prior to visit.   Review of Systems  Review of Systems  Constitutional: Negative.   HENT:  Positive for congestion and postnasal drip.   Respiratory:  Positive for cough. Negative for shortness of breath and wheezing.   Cardiovascular: Negative.    Physical Exam  BP (!) 140/68 (BP Location: Left Arm, Patient Position: Sitting, Cuff Size: Large)   Pulse 72   Temp 98.8 F (37.1 C) (Oral)   Ht 5\' 7"  (1.702 m)   Wt 220 lb (99.8 kg)   LMP  (LMP Unknown)   SpO2 95% Comment: 4L continuous (tank)  BMI 34.46  kg/m  Physical Exam Constitutional:      General: She is not in acute distress.    Appearance: Normal appearance. She is not ill-appearing.  HENT:     Head: Normocephalic and atraumatic.     Mouth/Throat:     Mouth: Mucous membranes are moist.     Pharynx: Oropharynx is clear.  Cardiovascular:     Rate and Rhythm: Normal rate and regular rhythm.  Pulmonary:     Effort: Pulmonary effort is normal.     Breath sounds: Rhonchi present.     Comments: R>L, mostly clears with cough Musculoskeletal:        General: Normal range of motion.  Skin:    General: Skin is warm and dry.  Neurological:     General: No focal deficit present.     Mental Status: She is alert and oriented to person, place, and time. Mental status is at baseline.  Psychiatric:        Mood and Affect: Mood normal.        Behavior: Behavior normal.        Thought Content: Thought content normal.        Judgment: Judgment normal.      Lab Results:  CBC    Component Value Date/Time   WBC 15.0 (H) 07/07/2023 1444   WBC 18.1 (H) 06/28/2023 0219   RBC 3.82 07/07/2023 1444   RBC 3.61 (L) 06/28/2023 0219   HGB 10.1 (L) 07/07/2023 1444   HCT 31.4 (L) 07/07/2023 1444   PLT 261 07/07/2023 1444   MCV 82 07/07/2023 1444   MCH 26.4 (L) 07/07/2023 1444   MCH 26.6 06/28/2023 0219   MCHC 32.2 07/07/2023 1444   MCHC 32.4 06/28/2023 0219   RDW 15.1 07/07/2023 1444   LYMPHSABS 0.4 (L) 06/15/2023 1659   MONOABS 0.4 06/15/2023 1659   EOSABS 0.0 06/15/2023 1659   BASOSABS 0.0 06/15/2023 1659    BMET    Component Value Date/Time   NA 138 07/08/2023 0921   K 3.8 07/08/2023 0921   CL 94 (L) 07/08/2023 0921   CO2 30 (H) 07/08/2023 0921   GLUCOSE 171 (H) 07/08/2023 0921   GLUCOSE 165 (H) 06/28/2023 0219   BUN 43 (H) 07/08/2023 0921   CREATININE 1.28 (H) 07/08/2023 0921   CREATININE 0.63 08/28/2015 0926   CALCIUM 7.8 (L) 07/08/2023 0921   GFRNONAA 50 (L) 06/28/2023 0219   GFRNONAA >89 02/27/2014 1149   GFRAA 108  02/19/2020 0935   GFRAA >89 02/27/2014 1149  BNP    Component Value Date/Time   BNP 211.7 (H) 06/20/2023 0548    ProBNP    Component Value Date/Time   PROBNP 395.0 (H) 09/26/2014 1017    Imaging: DG CHEST PORT 1 VIEW  Result Date: 06/25/2023 CLINICAL DATA:  Pneumonia. EXAM: PORTABLE CHEST 1 VIEW COMPARISON:  June 23, 2023. FINDINGS: Stable cardiomegaly. Mild central pulmonary vascular congestion is noted. Stable bibasilar atelectasis or infiltrates are noted with probable associated effusions. Bony thorax is unremarkable. IMPRESSION: Stable bibasilar opacities as noted above. Electronically Signed   By: Lupita Raider M.D.   On: 06/25/2023 10:29   DG CHEST PORT 1 VIEW  Result Date: 06/23/2023 CLINICAL DATA:  242050 Oxygen desaturation 161096 EXAM: PORTABLE CHEST - 1 VIEW COMPARISON:  06/19/2023 FINDINGS: Low lung volumes. Small bilateral effusions with bibasilar atelectasis/consolidation. Heart size upper limits normal. Aortic Atherosclerosis (ICD10-170.0). Visualized bones unremarkable. IMPRESSION: Small bilateral effusions with bibasilar atelectasis/consolidation. Electronically Signed   By: Corlis Leak M.D.   On: 06/23/2023 14:21   DG CHEST PORT 1 VIEW  Result Date: 06/19/2023 CLINICAL DATA:  Shortness of breath EXAM: PORTABLE CHEST 1 VIEW COMPARISON:  Radiographs 06/16/2023 FINDINGS: No significant change from prior. Cardiomegaly. Bilateral interstitial and airspace opacities greatest in the left lower lung. Small left pleural effusion. No pneumothorax. IMPRESSION: Similar congestive heart failure compared with 06/16/2023. Electronically Signed   By: Minerva Fester M.D.   On: 06/19/2023 23:06   DG CHEST PORT 1 VIEW  Result Date: 06/16/2023 CLINICAL DATA:  Acute hypoxic respiratory failure. EXAM: PORTABLE CHEST 1 VIEW COMPARISON:  06/15/2023 FINDINGS: Cardiomegaly and aortic atherosclerosis as seen previously. Pulmonary venous hypertension with diminishing interstitial edema. No  visible effusion. No worsening or new finding. IMPRESSION: Congestive heart failure with diminishing interstitial edema. Electronically Signed   By: Paulina Fusi M.D.   On: 06/16/2023 15:23   ECHOCARDIOGRAM COMPLETE  Result Date: 06/16/2023    ECHOCARDIOGRAM REPORT   Patient Name:   MARON ENTWISLE Date of Exam: 06/16/2023 Medical Rec #:  [id number]       Height:       67.0 in Accession #:    [id number]      Weight:       216.0 lb Date of Birth:  04-22-1956        BSA:          2.089 m Patient Age:    67 years        BP:           148/49 mmHg Patient Gender: F               HR:           58 bpm. Exam Location:  Inpatient Procedure: 2D Echo, Cardiac Doppler, Color Doppler and Intracardiac            Opacification Agent Indications:    Dyspnea R06.00  History:        Patient has prior history of Echocardiogram examinations, most                 recent 02/07/2023. CAD, COPD; Risk Factors:Hypertension, Diabetes                 and Dyslipidemia.  Sonographer:    Lucendia Herrlich Referring Phys: [id number] CAROLYN GUILLOUD IMPRESSIONS  1. Left ventricular ejection fraction, by estimation, is 55 to 60%. The left ventricle has normal function. The left ventricle has no regional wall motion abnormalities. The left ventricular internal cavity  size was mildly to moderately dilated. There is moderate concentric left ventricular hypertrophy. Left ventricular diastolic parameters are consistent with Grade III diastolic dysfunction (restrictive). Elevated left ventricular end-diastolic pressure.  2. Right ventricular systolic function is normal. The right ventricular size is not well visualized. There is moderately elevated pulmonary artery systolic pressure. The estimated right ventricular systolic pressure is 45.5 mmHg.  3. Left atrial size was mild to moderately dilated.  4. Right atrial size was mild to moderately dilated.  5. The mitral valve is grossly normal. Trivial mitral valve regurgitation. No evidence of mitral stenosis.   6. The aortic valve is tricuspid. Aortic valve regurgitation is not visualized. No aortic stenosis is present.  7. The inferior vena cava is dilated in size with <50% respiratory variability, suggesting right atrial pressure of 15 mmHg. Comparison(s): Prior images reviewed side by side. Conclusion(s)/Recommendation(s): Normal systolic function with grade 3 diastolic dysfunction and elevated LVEDP. FINDINGS  Left Ventricle: Left ventricular ejection fraction, by estimation, is 55 to 60%. The left ventricle has normal function. The left ventricle has no regional wall motion abnormalities. Definity contrast agent was given IV to delineate the left ventricular  endocardial borders. The left ventricular internal cavity size was mildly to moderately dilated. There is moderate concentric left ventricular hypertrophy. Left ventricular diastolic parameters are consistent with Grade III diastolic dysfunction (restrictive). Elevated left ventricular end-diastolic pressure. Right Ventricle: The right ventricular size is not well visualized. Right vetricular wall thickness was not well visualized. Right ventricular systolic function is normal. There is moderately elevated pulmonary artery systolic pressure. The tricuspid regurgitant velocity is 2.76 m/s, and with an assumed right atrial pressure of 15 mmHg, the estimated right ventricular systolic pressure is 45.5 mmHg. Left Atrium: Left atrial size was mild to moderately dilated. Right Atrium: Right atrial size was mild to moderately dilated. Pericardium: Trivial pericardial effusion is present. Mitral Valve: The mitral valve is grossly normal. Trivial mitral valve regurgitation. No evidence of mitral valve stenosis. Tricuspid Valve: The tricuspid valve is grossly normal. Tricuspid valve regurgitation is mild . No evidence of tricuspid stenosis. Aortic Valve: The aortic valve is tricuspid. Aortic valve regurgitation is not visualized. No aortic stenosis is present. Aortic valve  peak gradient measures 11.2 mmHg. Pulmonic Valve: The pulmonic valve was not well visualized. Pulmonic valve regurgitation is not visualized. Aorta: The aortic root, ascending aorta, aortic arch and descending aorta are all structurally normal, with no evidence of dilitation or obstruction. Venous: The inferior vena cava is dilated in size with less than 50% respiratory variability, suggesting right atrial pressure of 15 mmHg. IAS/Shunts: The atrial septum is grossly normal.  LEFT VENTRICLE PLAX 2D LVIDd:         6.00 cm   Diastology LVIDs:         4.30 cm   LV e' medial:    5.26 cm/s LV PW:         1.10 cm   LV E/e' medial:  29.1 LV IVS:        1.40 cm   LV e' lateral:   6.40 cm/s LVOT diam:     2.20 cm   LV E/e' lateral: 23.9 LV SV:         81 LV SV Index:   39 LVOT Area:     3.80 cm  RIGHT VENTRICLE             IVC RV S prime:     16.30 cm/s  IVC diam: 2.50 cm TAPSE (M-mode):  1.9 cm LEFT ATRIUM             Index        RIGHT ATRIUM           Index LA diam:        4.70 cm 2.25 cm/m   RA Area:     19.80 cm LA Vol (A2C):   55.6 ml 26.61 ml/m  RA Volume:   59.60 ml  28.52 ml/m LA Vol (A4C):   72.9 ml 34.89 ml/m LA Biplane Vol: 69.5 ml 33.26 ml/m  AORTIC VALVE AV Area (Vmax): 2.53 cm AV Vmax:        167.00 cm/s AV Peak Grad:   11.2 mmHg LVOT Vmax:      111.00 cm/s LVOT Vmean:     72.500 cm/s LVOT VTI:       0.213 m  AORTA Ao Root diam: 3.00 cm Ao Asc diam:  3.40 cm MITRAL VALVE                TRICUSPID VALVE MV Area (PHT): 4.21 cm     TR Peak grad:   30.5 mmHg MV Decel Time: 180 msec     TR Vmax:        276.00 cm/s MR Peak grad: 43.0 mmHg MR Vmax:      328.00 cm/s   SHUNTS MV E velocity: 153.00 cm/s  Systemic VTI:  0.21 m MV A velocity: 41.30 cm/s   Systemic Diam: 2.20 cm MV E/A ratio:  3.70 Jodelle Red MD Electronically signed by Jodelle Red MD Signature Date/Time: 06/16/2023/1:05:38 PM    Final    US RENAL  Result Date: 06/16/2023 CLINICAL DATA:  Difficulty urinating EXAM: RENAL /  URINARY TRACT ULTRASOUND COMPLETE COMPARISON:  None Available. FINDINGS: Right Kidney: Renal measurements: 10.3 x 5 x 5.3 cm = volume: 141 mL. Echogenicity within normal limits. No mass or hydronephrosis visualized. Left Kidney: Renal measurements: 11 x 4.7 x 4.7 cm = volume: 127 mL. Echogenicity within normal limits. No mass or hydronephrosis visualized. Bladder: Appears normal for degree of bladder distention. Other: None. IMPRESSION: No acute findings.  No hydronephrosis. Electronically Signed   By: Charlett Nose M.D.   On: 06/16/2023 01:21   DG Chest Portable 1 View  Result Date: 06/15/2023 CLINICAL DATA:  Shortness of breath EXAM: PORTABLE CHEST 1 VIEW COMPARISON:  03/16/2023 and prior radiographs FINDINGS: Cardiomegaly with pulmonary vascular congestion noted. Bilateral interstitial opacities are present suspicious for interstitial edema. Question trace bilateral pleural effusions. Mild bibasilar opacities/atelectasis identified. There is no evidence of pneumothorax or acute bony abnormality. IMPRESSION: 1. Cardiomegaly with pulmonary vascular congestion and probable interstitial edema. 2. Question trace bilateral pleural effusions. 3. Mild bibasilar opacities, likely atelectasis. Electronically Signed   By: Harmon Pier M.D.   On: 06/15/2023 17:45     Assessment & Plan:   COPD (chronic obstructive pulmonary disease) (HCC) - Emphysema noted on CT - Oredered for PFT - Change trelegy 200  Chronic respiratory failure with hypoxia, on home oxygen therapy (HCC) Patient has emphysema and chronic respiratory failure on 4-5L oxygen Awaiting PFTs to be scheduled Referring to pulmonary rehab Needs to use 5L continuous flow/ DME order placed for patient to be provided tanks   HAP (hospital-acquired pneumonia) - Treated for HAP with Levaquin and prednisone - Has congested cough with clear mucus - Advised patient take mucinex 1200mg  twice daily, use nebulizer 2-3 times a day and use flutter valve      Recommendations: Start mucinex  1200mg  twice daily Start flutter valve three times a day  Get back using nebulizer 2-3 times a day for 1 week  Sample of Trelegy one puff daily in the morning / new prescription sent  Use 5L oxygen continuous (do not use POC)  Orders: CXR (ordered) PFTs (ordered) Please have [facility] provide patient with oxygen tanks, needs to use 4-5L oxygen continuously  Sample of Trelegy  Please provide patient with flutter valve   Referral: Pulmonary rehab (ordered)  Follow-up: First available with Dr. Laurier Nancy, NP 07/14/2023

## 2023-07-14 NOTE — Progress Notes (Signed)
HEART IMPACT TRANSITIONS OF CARE     PCP: Dr Oswaldo Done  Primary Cardiologist: Dr Anne Fu.  Pulmonary: Ames Dura NP   HPI: [name] is a 67 y.o. female w/ h/o systolic/diastolic heart failure with EF originally 30% in 2014 improved to 60% in 2020. Also w/ severe COPD on home O2, HTN and HLD. Quit smoking 3 year s ago/. Followed by Dr. Anne Fu.    Recently admitted w/ acute hypoxic respiratory failure due to CHF w/ volume overload and COPDE. Diuresed w/ IV Lasix and treated w/ abx, steroids and nebs. Echo showed normal EF 55-60% and normal RV. After diureses, was transitioned to PO Lasix. Also placed on Entr   She had initial TOC visit 10/2022. Jardiacne increased to 10 mg daily and spironlactone added.   Admitted 06/15/23 with A/C HFpEF/COPD. Hospital course complicated by AKI and pneumonia. Echo showed EF remained preserved. Treated with antibiotics. Entresto and spironolactone held due to AKI. Creatinine peaked at 2. Discharge weight 213 pounds.   Saw [facility] 07/08/23. K 3.8 and Creatinine 1.28 .    Today she returns for HF follow up.Overall feeling fine. Remains on 5  liters [place]. Able to walk a few steps at home.  Gets short breath with exertion. Denies PND/Orthopnea. Appetite ok. No fever or chills. Weight at home 219 pounds. Taking all medications. [facility] starting RN/OT/PT . Lives with daughter and granddaughter.    Cardiac Testing   Echo 05/2023  1. Left ventricular ejection fraction, by estimation, is 55 to 60%. The  left ventricle has normal function. The left ventricle has no regional  wall motion abnormalities. The left ventricular internal cavity size was  mildly to moderately dilated. There  is moderate concentric left ventricular hypertrophy. Left ventricular  diastolic parameters are consistent with Grade III diastolic dysfunction  (restrictive). Elevated left ventricular end-diastolic pressure.   2. Right ventricular systolic function is  normal. The right ventricular  size is not well visualized. There is moderately elevated pulmonary artery  systolic pressure. The estimated right ventricular systolic pressure is  45.5 mmHg.   3. Left atrial size was mild to moderately dilated.   4. Right atrial size was mild to moderately dilated.   5. The mitral valve is grossly normal. Trivial mitral valve  regurgitation. No evidence of mitral stenosis.   2D Echo 10/12/22 Left ventricular ejection fraction, by estimation, is 55 to 60%. Left ventricular ejection fraction by PLAX is 56 %. The left ventricle has normal function. The left ventricle has no regional wall motion abnormalities. The left ventricular internal cavity size was moderately dilated. There is mild concentric left ventricular hypertrophy. Left ventricular diastolic parameters are consistent with Grade II diastolic dysfunction (pseudonormalization). 1. Right ventricular systolic function is normal. The right ventricular size is normal. There is mildly elevated pulmonary artery systolic pressure. 2. 3. Left atrial size was severely dilated. 4. Right atrial size was moderately dilated. The mitral valve is normal in structure. Trivial mitral valve regurgitation. No evidence of mitral stenosis. 5. The aortic valve is tricuspid. Aortic valve regurgitation is not visualized. No aortic stenosis is present. 6. The inferior vena cava is normal in size with greater than 50% respiratory variability, suggesting right atrial pressure of 3 mmHg.        ROS: All systems negative except as listed in HPI, PMH and Problem List.  SH:  Social History   Socioeconomic History   Marital status: Widowed    Spouse name: Not on file  Number of children: 3   Years of education: Not on file   Highest education level: High school graduate  Occupational History   Occupation: Disabled    Comment: Formerly worked in Dentist  Tobacco Use   Smoking status: Former     Current packs/day: 0.00    Average packs/day: 1 pack/day for 40.0 years (40.0 ttl pk-yrs)    Types: Cigarettes    Start date: 09/17/1979    Quit date: 09/17/2019    Years since quitting: 3.8   Smokeless tobacco: Never  Vaping Use   Vaping status: Never Used  Substance and Sexual Activity   Alcohol use: No    Alcohol/week: 0.0 standard drinks of alcohol   Drug use: No   Sexual activity: Not Currently    Birth control/protection: Post-menopausal  Other Topics Concern   Not on file  Social History Narrative   Current Social History 02/25/2021        Patient lives with family in a home which is 2 stories. There are steps up to the entrance the patient uses with handrails.       Patient's method of transportation is personal car.      The highest level of education was some high school.      The patient currently disabled.      Identified important Relationships are "Family, my daughter and grandkids"       Pets : My dog       Interests / Fun: "sleeping, working in my flowerbed        Current Stressors: The war in [place]       Social Determinants of Health   Financial Resource Strain: Low Risk  (06/18/2023)   Overall Financial Resource Strain (CARDIA)    Difficulty of Paying Living Expenses: Not hard at all  Food Insecurity: No Food Insecurity (06/18/2023)   Hunger Vital Sign    Worried About Running Out of Food in the Last Year: Never true    Ran Out of Food in the Last Year: Never true  Transportation Needs: No Transportation Needs (06/18/2023)   PRAPARE - Administrator, Civil Service (Medical): No    Lack of Transportation (Non-Medical): No  Physical Activity: Inactive (05/19/2023)   Exercise Vital Sign    Days of Exercise per Week: 0 days    Minutes of Exercise per Session: 0 min  Stress: No Stress Concern Present (05/19/2023)   [employer] of Occupational Health - Occupational Stress Questionnaire    Feeling of Stress : Not at all  Social  Connections: Socially Isolated (05/19/2023)   Social Connection and Isolation Panel [NHANES]    Frequency of Communication with Friends and Family: More than three times a week    Frequency of Social Gatherings with Friends and Family: Never    Attends Religious Services: Never    Database administrator or Organizations: No    Attends Banker Meetings: Never    Marital Status: Widowed  Intimate Partner Violence: Not At Risk (06/18/2023)   Humiliation, Afraid, Rape, and Kick questionnaire    Fear of Current or Ex-Partner: No    Emotionally Abused: No    Physically Abused: No    Sexually Abused: No    FH:  Family History  Problem Relation Age of Onset   Diabetes Mellitus II Mother    Hypertension Mother    Cerebrovascular Accident Mother 29       Massive, resulted in death   Pulmonary  embolism Father 38       Resulted in sudden death   Heart failure Brother    Obesity Brother    Coronary artery disease Brother    Obesity Brother    COPD Brother    Diabetes Mellitus II Brother    Healthy Brother    Healthy Brother    Healthy Daughter    Healthy Son    Healthy Son     Past Medical History:  Diagnosis Date   Abscess of skin of abdomen 08/31/2018   Acquired lactose intolerance 09/24/2017   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  Non-functioning   Aortic atherosclerosis (HCC) 09/24/2017   Asymptomatic, found on CT scan   Blood transfusion without reported diagnosis    Chronic Systolic Heart Failure 05/10/2014   Felt to be non-ischemic and secondary to hypertension.  Echo (05/28/2014): LVEF 25%.  Is not interested in AICD placement.   COPD exacerbation (HCC)    Coronary artery disease involving native coronary artery of native heart without angina pectoris 11/09/2014   Cardiac cath (02/18/2014): Non-obstructive, mRCA 30%, dRCA 60%   Cystocele with uterine prolapse - grade 3 02/12/2016   Not interested in pessary  after trying   Diverticulosis of colon 09/24/2017   Diverticulosis of colon 09/24/2017   Essential hypertension 10/15/2013   Gastroesophageal reflux disease 09/24/2017   History of cerebrovascular accident 05/10/2013   Per patient report 6 previous strokes, most recent on 05/10/13.  No residual deficits.   History of cerebrovascular accident 05/10/2013   Per patient report 6 previous strokes, most recent on 05/10/13.  No residual deficits.   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 (HCC) 10/15/2013    Current Outpatient Medications  Medication Sig Dispense Refill   ACCU-CHEK AVIVA PLUS test strip USE TO TEST BLOOD SUGARS AS DIRECTED (Patient taking differently: 1 each by Other route See admin instructions. USE TO TEST BLOOD SUGARS AS DIRECTED) 100 strip 12   amLODipine (NORVASC) 10 MG tablet TAKE 1 TABLET BY MOUTH EVERY DAY 90 tablet 3   apixaban (ELIQUIS) 5 MG TABS tablet Take 1 tablet (5 mg total) by mouth 2 (two) times daily.     atorvastatin (LIPITOR) 80 MG tablet TAKE 1 TABLET BY MOUTH EVERY DAY 90 tablet 3   B-D UF III MINI PEN NEEDLES 31G X 5 MM MISC USE 3 TIMES A DAY WITH HUMALOG 100 each 5   Dulaglutide (TRULICITY) 1.5 MG/0.5ML SOPN Inject 1.5 mg into the skin once a week. 2 mL 5   ezetimibe (ZETIA) 10 MG tablet Take 1 tablet (10 mg total) by mouth daily. 90 tablet 3   Fluticasone-Umeclidin-Vilant (TRELEGY ELLIPTA) 200-62.5-25 MCG/ACT AEPB Inhale 1 puff into the lungs daily. 60 each 5   guaiFENesin-dextromethorphan (ROBITUSSIN DM) 100-10 MG/5ML syrup Take 10 mLs by mouth at bedtime.     hydrOXYzine (ATARAX) 25 MG tablet  Take 1 tablet (25 mg total) by mouth every 6 (six) hours as needed for anxiety. 30 tablet 0   insulin glargine (LANTUS) 100 UNIT/ML Solostar Pen Inject 30 Units into the skin  daily. 15 mL 3   insulin lispro (HUMALOG) 100 UNIT/ML KwikPen Inject 14 units before breakfast and lunch, inject 20 units before dinner 15 mL 11   Insulin Pen Needle 32G X 4 MM MISC Use to inject insulin 4 times a day. The patient is insulin requiring, ICD 10 code 11.10. The patient injects 4 times per day. 400 each 3   Insulin Syringe-Needle U-100 31G X 15/64" 0.3 ML MISC Use to inject Humalog before meals three times a day 100 each 5   Lancets (ACCU-CHEK MULTICLIX) lancets Use to check your blood sugar four times daily: early morning, before a meal, two hours after a meal, and bedtime 100 each 12   nystatin (MYCOSTATIN) 100000 UNIT/ML suspension Take 4 mLs (400,000 Units total) by mouth 4 (four) times daily for 7 days. swish in the mouth and retain for as long as possible (several minutes) before swallowing 60 mL 0   nystatin powder Apply 1 Application topically 3 (three) times daily. 15 g 2   ondansetron (ZOFRAN) 4 MG tablet Take 1 tablet (4 mg total) by mouth daily as needed for nausea or vomiting. 30 tablet 0   phenol (CHLORASEPTIC) 1.4 % LIQD Use as directed 1 spray in the mouth or throat as needed for throat irritation / pain. 177 mL 0   sacubitril-valsartan (ENTRESTO) 24-26 MG Take 1 tablet by mouth 2 (two) times daily. 60 tablet 0   senna (SENOKOT) 8.6 MG TABS tablet Take 2 tablets (17.2 mg total) by mouth daily. 120 tablet 2   simethicone (MYLICON) 80 MG chewable tablet Chew 1 tablet (80 mg total) by mouth every 6 (six) hours as needed for flatulence (bloating). 30 tablet 0   spironolactone (ALDACTONE) 25 MG tablet Take 12.5 mg by mouth daily.     torsemide 40 MG TABS Take 40 mg by mouth 2 (two) times daily. 60 tablet 0   Wound Dressings (MEDIHONEY WOUND/BURN DRESSING) GEL APPLY TOPICALLY DAILY * OTC ITEM (Patient  taking differently: Apply 1 application  topically daily.) 15 mL 0   No current facility-administered medications for this encounter.    Vitals:   07/14/23 1330  BP: (!) 157/86  Pulse: 84  SpO2: 90%  Weight: 99.3 kg (219 lb)   Wt Readings from Last 3 Encounters:  07/14/23 99.3 kg (219 lb)  07/14/23 99.8 kg (220 lb)  07/08/23 96.8 kg (213 lb 6.4 oz)     PHYSICAL EXAM: General: Arrived in a wheelchair.  No resp difficulty HEENT: normal Neck: supple. JVP 9-10 . Carotids 2+ bilaterally; no bruits. No lymphadenopathy or thryomegaly appreciated. Cor: PMI normal. Regular rate & rhythm. No rubs, gallops or murmurs. Lungs: R and LLL coarse throughout on 5 liters .  Abdomen: soft, nontender, nondistended. No hepatosplenomegaly. No bruits or masses. Good bowel sounds. Extremities: no cyanosis, clubbing, rash, R and LLE 1-2+ edema Neuro: alert & orientedx3, cranial nerves grossly intact. Moves all 4 extremities w/o difficulty. Affect pleasant.  EKG: SR     ASSESSMENT & PLAN:  1. HFpEF - Echo 05/2023 EF 55-60%, RV normal - NYHA III. Volume status trending up. Increase  morning torsemide to 40 mg and continue evening torsemide at 20 mg. . - Continue jardiance to 10 mg daily  -Previously off ARNI and MRA due to AKI but post hospital lab work look ok.  - Add  12.5 mg  spironolactone daily.  Check BMET next week.  - Discussed low salt food choices and limiting fluid intake to < 64 ounces.  2. HTN- Elevated. As above adding spironolactone.  - Consider restarting entresto soon.    3. Chronic Respiratory Failure, COPD. - Former smoker.  - Followed by Pulmonary. Oxygen increased to 5 liters [place].    4. Type 2DM  - Continue ardiance to 10 mg for CV benefit dose  - other management per PCP   5. AKI  Creatinine during recent hospitalization peaked at 2. Follow up labs improved.     Follow up 3 weeks to reassess volume if improved can resume care at [facility].   Vicie Cech NP_C 1:31  PM

## 2023-07-14 NOTE — Assessment & Plan Note (Signed)
-   Treated for HAP with Levaquin and prednisone - Has congested cough with clear mucus - Advised patient take mucinex 1200mg  twice daily, use nebulizer 2-3 times a day and use flutter valve

## 2023-07-14 NOTE — Assessment & Plan Note (Signed)
Patient has emphysema and chronic respiratory failure on 4-5L oxygen Awaiting PFTs to be scheduled Referring to pulmonary rehab Needs to use 5L continuous flow/ DME order placed for patient to be provided tanks

## 2023-07-14 NOTE — Progress Notes (Signed)
Internal Medicine Clinic Attending  Case discussed with the resident at the time of the visit.  We reviewed the resident's history and exam and pertinent patient test results.  I agree with the assessment, diagnosis, and plan of care documented in the resident's note.  

## 2023-07-14 NOTE — Patient Instructions (Addendum)
Recommendations: Start mucinex 1200mg  twice daily Start flutter valve three times a day  Get back using nebulizer 2-3 times a day for 1 week  Sample of Trelegy one puff daily in the morning / new prescription sent  Use 5L oxygen continuous (do not use POC)  Orders: CXR (ordered) PFTs (ordered) Please have [employer] provide patient with oxygen tanks, needs to use 4-5L oxygen continuously  Sample of Trelegy  Please provide patient with flutter valve   Referral: Pulmonary rehab (ordered)  Follow-up: First available with Dr. Celine Mans

## 2023-07-14 NOTE — Patient Instructions (Signed)
EKG done today.  No Labs done today.   RESTART Spironolactone 12.5mg  (1/2 tablet) by mouth daily.  INCREASE Torsemide to 40mg  (2 tablets) by mouth every morning and 20mg  (1 tablet) by mouth every evening.  No other medication changes were made. Please continue all current medications as prescribed.  Your physician recommends that you schedule a follow-up appointment in: 1 week for a lab only appointment and in 3 weeks for a follow up appointment.   If you have any questions or concerns before your next appointment please send [name] a message through [place] or call our office at [phone].    TO LEAVE A MESSAGE FOR THE NURSE SELECT OPTION 2, PLEASE LEAVE A MESSAGE INCLUDING: YOUR NAME DATE OF BIRTH CALL BACK NUMBER REASON FOR CALL**this is important as we prioritize the call backs  YOU WILL RECEIVE A CALL BACK THE SAME DAY AS LONG AS YOU CALL BEFORE 4:00 PM   Do the following things EVERYDAY: Weigh yourself in the morning before breakfast. Write it down and keep it in a log. Take your medicines as prescribed Eat low salt foods--Limit salt (sodium) to 2000 mg per day.  Stay as active as you can everyday Limit all fluids for the day to less than 2 liters   At the [facility], you and your health needs are our priority. As part of our continuing mission to provide you with exceptional heart care, we have created designated Provider Care Teams. These Care Teams include your primary Cardiologist (physician) and Advanced Practice Providers (APPs- Physician Assistants and Nurse Practitioners) who all work together to provide you with the care you need, when you need it.   You may see any of the following providers on your designated Care Team at your next follow up: Dr Arvilla Meres Dr Marca Ancona Dr. Marcos Eke, NP Robbie Lis, [place] [facility] [place], [place] Brynda Peon, NP Karle Plumber, PharmD   Please be sure to bring in all  your medications bottles to every appointment.    Thank you for choosing [name]

## 2023-07-15 ENCOUNTER — Telehealth: Payer: Self-pay | Admitting: Primary Care

## 2023-07-15 ENCOUNTER — Ambulatory Visit: Payer: 59 | Attending: Nurse Practitioner

## 2023-07-15 DIAGNOSIS — J9811 Atelectasis: Secondary | ICD-10-CM | POA: Diagnosis not present

## 2023-07-15 DIAGNOSIS — J441 Chronic obstructive pulmonary disease with (acute) exacerbation: Secondary | ICD-10-CM | POA: Diagnosis not present

## 2023-07-15 DIAGNOSIS — I480 Paroxysmal atrial fibrillation: Secondary | ICD-10-CM | POA: Diagnosis not present

## 2023-07-15 DIAGNOSIS — J440 Chronic obstructive pulmonary disease with acute lower respiratory infection: Secondary | ICD-10-CM | POA: Diagnosis not present

## 2023-07-15 DIAGNOSIS — I2510 Atherosclerotic heart disease of native coronary artery without angina pectoris: Secondary | ICD-10-CM | POA: Diagnosis not present

## 2023-07-15 DIAGNOSIS — Z9181 History of falling: Secondary | ICD-10-CM | POA: Diagnosis not present

## 2023-07-15 DIAGNOSIS — I5032 Chronic diastolic (congestive) heart failure: Secondary | ICD-10-CM

## 2023-07-15 DIAGNOSIS — Z7951 Long term (current) use of inhaled steroids: Secondary | ICD-10-CM | POA: Diagnosis not present

## 2023-07-15 DIAGNOSIS — D649 Anemia, unspecified: Secondary | ICD-10-CM | POA: Diagnosis not present

## 2023-07-15 DIAGNOSIS — B9689 Other specified bacterial agents as the cause of diseases classified elsewhere: Secondary | ICD-10-CM | POA: Diagnosis not present

## 2023-07-15 DIAGNOSIS — I110 Hypertensive heart disease with heart failure: Secondary | ICD-10-CM | POA: Diagnosis not present

## 2023-07-15 DIAGNOSIS — Z9981 Dependence on supplemental oxygen: Secondary | ICD-10-CM | POA: Diagnosis not present

## 2023-07-15 DIAGNOSIS — I3139 Other pericardial effusion (noninflammatory): Secondary | ICD-10-CM | POA: Diagnosis not present

## 2023-07-15 DIAGNOSIS — I081 Rheumatic disorders of both mitral and tricuspid valves: Secondary | ICD-10-CM | POA: Diagnosis not present

## 2023-07-15 DIAGNOSIS — E785 Hyperlipidemia, unspecified: Secondary | ICD-10-CM | POA: Diagnosis not present

## 2023-07-15 DIAGNOSIS — Z794 Long term (current) use of insulin: Secondary | ICD-10-CM | POA: Diagnosis not present

## 2023-07-15 DIAGNOSIS — J9621 Acute and chronic respiratory failure with hypoxia: Secondary | ICD-10-CM | POA: Diagnosis not present

## 2023-07-15 DIAGNOSIS — Z7985 Long-term (current) use of injectable non-insulin antidiabetic drugs: Secondary | ICD-10-CM | POA: Diagnosis not present

## 2023-07-15 DIAGNOSIS — S3092XD Unspecified superficial injury of abdominal wall, subsequent encounter: Secondary | ICD-10-CM | POA: Diagnosis not present

## 2023-07-15 DIAGNOSIS — I5043 Acute on chronic combined systolic (congestive) and diastolic (congestive) heart failure: Secondary | ICD-10-CM

## 2023-07-15 DIAGNOSIS — Z7901 Long term (current) use of anticoagulants: Secondary | ICD-10-CM | POA: Diagnosis not present

## 2023-07-15 DIAGNOSIS — E874 Mixed disorder of acid-base balance: Secondary | ICD-10-CM | POA: Diagnosis not present

## 2023-07-15 DIAGNOSIS — E119 Type 2 diabetes mellitus without complications: Secondary | ICD-10-CM | POA: Diagnosis not present

## 2023-07-15 DIAGNOSIS — Z7952 Long term (current) use of systemic steroids: Secondary | ICD-10-CM | POA: Diagnosis not present

## 2023-07-15 LAB — RESPIRATORY CULTURE OR RESPIRATORY AND SPUTUM CULTURE: MICRO NUMBER:: 15362177

## 2023-07-15 NOTE — Progress Notes (Signed)
Improving leukocytosis, stable hemoglobin. Ferritin increased likely in the setting of recent illness. Will not treat with PO iron at this time. Cr and GFR improving. Glucose elevated but BMPs since this was was drawn with improvement. Patient has continued taking her medications and has followed up with specialists. She will see her PCP in Sept.

## 2023-07-15 NOTE — Telephone Encounter (Signed)
PT's daughter brought in ppwk and asked me to give it to Amy, Ms. Walsh's nurse. I teams'd Amy and when no answer put it in Ms. Walsh's box w/Amy's name on it. It looked like a flutter valve RX

## 2023-07-16 ENCOUNTER — Telehealth (HOSPITAL_COMMUNITY): Payer: Self-pay

## 2023-07-16 ENCOUNTER — Telehealth: Payer: Self-pay

## 2023-07-16 ENCOUNTER — Telehealth: Payer: Self-pay | Admitting: Internal Medicine

## 2023-07-16 DIAGNOSIS — J432 Centrilobular emphysema: Secondary | ICD-10-CM

## 2023-07-16 LAB — BASIC METABOLIC PANEL
BUN/Creatinine Ratio: 23 (ref 12–28)
BUN: 19 mg/dL (ref 8–27)
CO2: 31 mmol/L — ABNORMAL HIGH (ref 20–29)
Calcium: 7.2 mg/dL — ABNORMAL LOW (ref 8.7–10.3)
Chloride: 91 mmol/L — ABNORMAL LOW (ref 96–106)
Creatinine, Ser: 0.82 mg/dL (ref 0.57–1.00)
Glucose: 220 mg/dL — ABNORMAL HIGH (ref 70–99)
Potassium: 3.9 mmol/L (ref 3.5–5.2)
Sodium: 137 mmol/L (ref 134–144)
eGFR: 79 mL/min/{1.73_m2} (ref >59)

## 2023-07-16 NOTE — Telephone Encounter (Signed)
Dr.Desai needs to sign off on the referral for [facility] and not the PA.

## 2023-07-16 NOTE — Telephone Encounter (Signed)
Called patient to see if she is interested in the Pulmonary Rehab Program. Patient expressed interest. Explained scheduling process and went over insurance, patient verbalized understanding. Someone from our pulmonary rehab staff will contact pt at a later time.  Currently she is participating in home health and will have to wait till that is finished before we schedule!

## 2023-07-16 NOTE — Telephone Encounter (Signed)
Spoke with the patient to discuss lab results and she states her lung doctor told her to restart the Spironolactone. She states she does not like taking this medication because it makes her feel back and bothers her nerves. She wants to know if she should still take the medication due to our office stopping the medication.

## 2023-07-16 NOTE — Telephone Encounter (Signed)
Dr. Celine Mans can you please sign orders for pulmonary rehab

## 2023-07-17 NOTE — Telephone Encounter (Signed)
Need a new order placed under Dr Celine Mans    for pulmonary rehab they can only be under a MD

## 2023-07-19 ENCOUNTER — Telehealth: Payer: Self-pay | Admitting: Cardiology

## 2023-07-19 ENCOUNTER — Other Ambulatory Visit: Payer: Self-pay

## 2023-07-19 ENCOUNTER — Inpatient Hospital Stay (HOSPITAL_COMMUNITY)
Admission: EM | Admit: 2023-07-19 | Discharge: 2023-07-28 | DRG: 177 | Disposition: A | Discharge status: Home Health | Payer: 59 | Attending: Internal Medicine | Admitting: Internal Medicine

## 2023-07-19 ENCOUNTER — Encounter: Payer: Self-pay | Admitting: Cardiology

## 2023-07-19 ENCOUNTER — Emergency Department (HOSPITAL_COMMUNITY): Payer: 59

## 2023-07-19 ENCOUNTER — Encounter (HOSPITAL_COMMUNITY): Payer: Self-pay | Admitting: Emergency Medicine

## 2023-07-19 DIAGNOSIS — R0902 Hypoxemia: Secondary | ICD-10-CM | POA: Diagnosis not present

## 2023-07-19 DIAGNOSIS — Z79899 Other long term (current) drug therapy: Secondary | ICD-10-CM

## 2023-07-19 DIAGNOSIS — E876 Hypokalemia: Secondary | ICD-10-CM | POA: Diagnosis present

## 2023-07-19 DIAGNOSIS — Z794 Long term (current) use of insulin: Secondary | ICD-10-CM

## 2023-07-19 DIAGNOSIS — E785 Hyperlipidemia, unspecified: Secondary | ICD-10-CM | POA: Diagnosis not present

## 2023-07-19 DIAGNOSIS — Z7951 Long term (current) use of inhaled steroids: Secondary | ICD-10-CM

## 2023-07-19 DIAGNOSIS — I480 Paroxysmal atrial fibrillation: Secondary | ICD-10-CM | POA: Diagnosis not present

## 2023-07-19 DIAGNOSIS — N1831 Chronic kidney disease, stage 3a: Secondary | ICD-10-CM | POA: Diagnosis not present

## 2023-07-19 DIAGNOSIS — J449 Chronic obstructive pulmonary disease, unspecified: Secondary | ICD-10-CM | POA: Diagnosis present

## 2023-07-19 DIAGNOSIS — R6889 Other general symptoms and signs: Secondary | ICD-10-CM | POA: Diagnosis not present

## 2023-07-19 DIAGNOSIS — I110 Hypertensive heart disease with heart failure: Secondary | ICD-10-CM | POA: Diagnosis not present

## 2023-07-19 DIAGNOSIS — F419 Anxiety disorder, unspecified: Secondary | ICD-10-CM | POA: Diagnosis present

## 2023-07-19 DIAGNOSIS — J9621 Acute and chronic respiratory failure with hypoxia: Secondary | ICD-10-CM | POA: Diagnosis present

## 2023-07-19 DIAGNOSIS — I130 Hypertensive heart and chronic kidney disease with heart failure and stage 1 through stage 4 chronic kidney disease, or unspecified chronic kidney disease: Secondary | ICD-10-CM | POA: Diagnosis not present

## 2023-07-19 DIAGNOSIS — E1122 Type 2 diabetes mellitus with diabetic chronic kidney disease: Secondary | ICD-10-CM | POA: Diagnosis not present

## 2023-07-19 DIAGNOSIS — E559 Vitamin D deficiency, unspecified: Secondary | ICD-10-CM | POA: Diagnosis not present

## 2023-07-19 DIAGNOSIS — R0602 Shortness of breath: Secondary | ICD-10-CM | POA: Diagnosis not present

## 2023-07-19 DIAGNOSIS — Z87891 Personal history of nicotine dependence: Secondary | ICD-10-CM | POA: Diagnosis not present

## 2023-07-19 DIAGNOSIS — J9601 Acute respiratory failure with hypoxia: Secondary | ICD-10-CM | POA: Diagnosis not present

## 2023-07-19 DIAGNOSIS — Z743 Need for continuous supervision: Secondary | ICD-10-CM | POA: Diagnosis not present

## 2023-07-19 DIAGNOSIS — Z7901 Long term (current) use of anticoagulants: Secondary | ICD-10-CM

## 2023-07-19 DIAGNOSIS — E1165 Type 2 diabetes mellitus with hyperglycemia: Secondary | ICD-10-CM | POA: Diagnosis present

## 2023-07-19 DIAGNOSIS — J441 Chronic obstructive pulmonary disease with (acute) exacerbation: Secondary | ICD-10-CM | POA: Diagnosis not present

## 2023-07-19 DIAGNOSIS — J439 Emphysema, unspecified: Secondary | ICD-10-CM | POA: Diagnosis not present

## 2023-07-19 DIAGNOSIS — R0989 Other specified symptoms and signs involving the circulatory and respiratory systems: Secondary | ICD-10-CM | POA: Diagnosis not present

## 2023-07-19 DIAGNOSIS — Z881 Allergy status to other antibiotic agents status: Secondary | ICD-10-CM

## 2023-07-19 DIAGNOSIS — J440 Chronic obstructive pulmonary disease with acute lower respiratory infection: Secondary | ICD-10-CM | POA: Diagnosis present

## 2023-07-19 DIAGNOSIS — I499 Cardiac arrhythmia, unspecified: Secondary | ICD-10-CM | POA: Diagnosis not present

## 2023-07-19 DIAGNOSIS — I509 Heart failure, unspecified: Secondary | ICD-10-CM | POA: Diagnosis not present

## 2023-07-19 DIAGNOSIS — J9620 Acute and chronic respiratory failure, unspecified whether with hypoxia or hypercapnia: Secondary | ICD-10-CM | POA: Diagnosis present

## 2023-07-19 DIAGNOSIS — U071 COVID-19: Secondary | ICD-10-CM | POA: Diagnosis not present

## 2023-07-19 DIAGNOSIS — J1282 Pneumonia due to coronavirus disease 2019: Secondary | ICD-10-CM | POA: Diagnosis present

## 2023-07-19 DIAGNOSIS — I5033 Acute on chronic diastolic (congestive) heart failure: Secondary | ICD-10-CM | POA: Diagnosis present

## 2023-07-19 DIAGNOSIS — I129 Hypertensive chronic kidney disease with stage 1 through stage 4 chronic kidney disease, or unspecified chronic kidney disease: Secondary | ICD-10-CM | POA: Diagnosis not present

## 2023-07-19 DIAGNOSIS — D631 Anemia in chronic kidney disease: Secondary | ICD-10-CM | POA: Diagnosis present

## 2023-07-19 DIAGNOSIS — J189 Pneumonia, unspecified organism: Secondary | ICD-10-CM | POA: Diagnosis not present

## 2023-07-19 DIAGNOSIS — N179 Acute kidney failure, unspecified: Secondary | ICD-10-CM | POA: Diagnosis present

## 2023-07-19 DIAGNOSIS — Z7985 Long-term (current) use of injectable non-insulin antidiabetic drugs: Secondary | ICD-10-CM

## 2023-07-19 DIAGNOSIS — I5032 Chronic diastolic (congestive) heart failure: Secondary | ICD-10-CM | POA: Diagnosis present

## 2023-07-19 DIAGNOSIS — I472 Ventricular tachycardia, unspecified: Secondary | ICD-10-CM | POA: Diagnosis not present

## 2023-07-19 DIAGNOSIS — Z9981 Dependence on supplemental oxygen: Secondary | ICD-10-CM | POA: Diagnosis not present

## 2023-07-19 DIAGNOSIS — R918 Other nonspecific abnormal finding of lung field: Secondary | ICD-10-CM | POA: Diagnosis not present

## 2023-07-19 DIAGNOSIS — R739 Hyperglycemia, unspecified: Secondary | ICD-10-CM | POA: Diagnosis not present

## 2023-07-19 DIAGNOSIS — J9611 Chronic respiratory failure with hypoxia: Secondary | ICD-10-CM | POA: Diagnosis not present

## 2023-07-19 LAB — CBC WITH DIFFERENTIAL/PLATELET
Abs Immature Granulocytes: 0.02 10*3/uL (ref 0.00–0.07)
Basophils Absolute: 0 10*3/uL (ref 0.0–0.1)
Basophils Relative: 0 %
Eosinophils Absolute: 0 10*3/uL (ref 0.0–0.5)
Eosinophils Relative: 0 %
HCT: 28.9 % — ABNORMAL LOW (ref 36.0–46.0)
Hemoglobin: 8.9 g/dL — ABNORMAL LOW (ref 12.0–15.0)
Immature Granulocytes: 0 %
Lymphocytes Relative: 21 %
Lymphs Abs: 1.2 10*3/uL (ref 0.7–4.0)
MCH: 26.2 pg (ref 26.0–34.0)
MCHC: 30.8 g/dL (ref 30.0–36.0)
MCV: 85 fL (ref 80.0–100.0)
Monocytes Absolute: 0.3 10*3/uL (ref 0.1–1.0)
Monocytes Relative: 6 %
Neutro Abs: 4 10*3/uL (ref 1.7–7.7)
Neutrophils Relative %: 73 %
Platelets: 251 10*3/uL (ref 150–400)
RBC: 3.4 MIL/uL — ABNORMAL LOW (ref 3.87–5.11)
RDW: 15.9 % — ABNORMAL HIGH (ref 11.5–15.5)
WBC: 5.5 10*3/uL (ref 4.0–10.5)
nRBC: 0 % (ref 0.0–0.2)

## 2023-07-19 LAB — RESP PANEL BY RT-PCR (RSV, FLU A&B, COVID)  RVPGX2
Influenza A by PCR: NEGATIVE
Influenza B by PCR: NEGATIVE
Resp Syncytial Virus by PCR: NEGATIVE
SARS Coronavirus 2 by RT PCR: POSITIVE — AB

## 2023-07-19 LAB — COMPREHENSIVE METABOLIC PANEL
ALT: 16 U/L (ref 0–44)
AST: 18 U/L (ref 15–41)
Albumin: 2.4 g/dL — ABNORMAL LOW (ref 3.5–5.0)
Alkaline Phosphatase: 47 U/L (ref 38–126)
Anion gap: 12 (ref 5–15)
BUN: 13 mg/dL (ref 8–23)
CO2: 31 mmol/L (ref 22–32)
Calcium: 6 mg/dL — CL (ref 8.9–10.3)
Chloride: 90 mmol/L — ABNORMAL LOW (ref 98–111)
Creatinine, Ser: 1.07 mg/dL — ABNORMAL HIGH (ref 0.44–1.00)
GFR, Estimated: 58 mL/min — ABNORMAL LOW (ref >60)
Glucose, Bld: 242 mg/dL — ABNORMAL HIGH (ref 70–99)
Potassium: 3.3 mmol/L — ABNORMAL LOW (ref 3.5–5.1)
Sodium: 133 mmol/L — ABNORMAL LOW (ref 135–145)
Total Bilirubin: 0.8 mg/dL (ref 0.3–1.2)
Total Protein: 5.7 g/dL — ABNORMAL LOW (ref 6.5–8.1)

## 2023-07-19 LAB — I-STAT ARTERIAL BLOOD GAS, ED
Acid-Base Excess: 5 mmol/L — ABNORMAL HIGH (ref 0.0–2.0)
Bicarbonate: 31.1 mmol/L — ABNORMAL HIGH (ref 20.0–28.0)
Calcium, Ion: 0.79 mmol/L — CL (ref 1.15–1.40)
HCT: 28 % — ABNORMAL LOW (ref 36.0–46.0)
Hemoglobin: 9.5 g/dL — ABNORMAL LOW (ref 12.0–15.0)
O2 Saturation: 94 %
Patient temperature: 38.7
Potassium: 3.1 mmol/L — ABNORMAL LOW (ref 3.5–5.1)
Sodium: 133 mmol/L — ABNORMAL LOW (ref 135–145)
TCO2: 33 mmol/L — ABNORMAL HIGH (ref 22–32)
pCO2 arterial: 59.1 mmHg — ABNORMAL HIGH (ref 32–48)
pH, Arterial: 7.337 — ABNORMAL LOW (ref 7.35–7.45)
pO2, Arterial: 84 mmHg (ref 83–108)

## 2023-07-19 LAB — MAGNESIUM: Magnesium: 1.3 mg/dL — ABNORMAL LOW (ref 1.7–2.4)

## 2023-07-19 LAB — PROTIME-INR
INR: 1.2 (ref 0.8–1.2)
Prothrombin Time: 14.9 seconds (ref 11.4–15.2)

## 2023-07-19 LAB — I-STAT CG4 LACTIC ACID, ED: Lactic Acid, Venous: 1.2 mmol/L (ref 0.5–1.9)

## 2023-07-19 LAB — APTT: aPTT: 41 seconds — ABNORMAL HIGH (ref 24–36)

## 2023-07-19 LAB — BRAIN NATRIURETIC PEPTIDE: B Natriuretic Peptide: 215.2 pg/mL — ABNORMAL HIGH (ref 0.0–100.0)

## 2023-07-19 MED ORDER — SENNA 8.6 MG PO TABS
2.0000 | ORAL_TABLET | Freq: Every day | ORAL | Status: DC
  Administered 2023-07-20 – 2023-07-23 (×4): 17.2 mg via ORAL
  Filled 2023-07-19 (×7): qty 2

## 2023-07-19 MED ORDER — METHYLPREDNISOLONE SODIUM SUCC 125 MG IJ SOLR: 125.0000 mg | Freq: Once | INTRAMUSCULAR | Status: DC

## 2023-07-19 MED ORDER — SACUBITRIL-VALSARTAN 24-26 MG PO TABS
1.0000 | ORAL_TABLET | Freq: Two times a day (BID) | ORAL | Status: DC
  Administered 2023-07-20 (×2): 1 via ORAL
  Filled 2023-07-19 (×2): qty 1

## 2023-07-19 MED ORDER — ACETAMINOPHEN 325 MG PO TABS
325.0000 mg | ORAL_TABLET | Freq: Once | ORAL | Status: AC
  Administered 2023-07-19: 325 mg via ORAL
  Filled 2023-07-19: qty 1

## 2023-07-19 MED ORDER — GUAIFENESIN-DM 100-10 MG/5ML PO SYRP
10.0000 mL | ORAL_SOLUTION | Freq: Every day | ORAL | Status: DC
  Administered 2023-07-19 – 2023-07-27 (×9): 10 mL via ORAL
  Filled 2023-07-19 (×10): qty 10

## 2023-07-19 MED ORDER — INSULIN ASPART 100 UNIT/ML IJ SOLN
0.0000 [IU] | Freq: Every day | INTRAMUSCULAR | Status: DC
  Administered 2023-07-20: 4 [IU] via SUBCUTANEOUS
  Administered 2023-07-20: 2 [IU] via SUBCUTANEOUS

## 2023-07-19 MED ORDER — APIXABAN 5 MG PO TABS
5.0000 mg | ORAL_TABLET | Freq: Two times a day (BID) | ORAL | Status: DC
  Administered 2023-07-20 – 2023-07-28 (×18): 5 mg via ORAL
  Filled 2023-07-19 (×18): qty 1

## 2023-07-19 MED ORDER — ACETAMINOPHEN 500 MG PO TABS
1000.0000 mg | ORAL_TABLET | Freq: Three times a day (TID) | ORAL | Status: DC | PRN
  Administered 2023-07-24 – 2023-07-27 (×3): 1000 mg via ORAL
  Filled 2023-07-19 (×4): qty 2

## 2023-07-19 MED ORDER — SODIUM CHLORIDE 0.9 % IV SOLN
500.0000 mg | INTRAVENOUS | Status: DC
  Administered 2023-07-19: 500 mg via INTRAVENOUS
  Filled 2023-07-19: qty 5

## 2023-07-19 MED ORDER — MAGNESIUM SULFATE 2 GM/50ML IV SOLN
2.0000 g | INTRAVENOUS | Status: AC
  Administered 2023-07-19: 2 g via INTRAVENOUS
  Filled 2023-07-19: qty 50

## 2023-07-19 MED ORDER — POTASSIUM CHLORIDE CRYS ER 20 MEQ PO TBCR
40.0000 meq | EXTENDED_RELEASE_TABLET | Freq: Once | ORAL | Status: AC
  Administered 2023-07-20: 40 meq via ORAL
  Filled 2023-07-19: qty 2

## 2023-07-19 MED ORDER — SPIRONOLACTONE 12.5 MG HALF TABLET
12.5000 mg | ORAL_TABLET | Freq: Every day | ORAL | Status: DC
  Administered 2023-07-20: 12.5 mg via ORAL
  Filled 2023-07-19: qty 1

## 2023-07-19 MED ORDER — AZITHROMYCIN 500 MG PO TABS
500.0000 mg | ORAL_TABLET | Freq: Every day | ORAL | Status: DC
  Administered 2023-07-20: 500 mg via ORAL
  Filled 2023-07-19: qty 1

## 2023-07-19 MED ORDER — INSULIN GLARGINE-YFGN 100 UNIT/ML ~~LOC~~ SOLN
15.0000 [IU] | Freq: Every day | SUBCUTANEOUS | Status: DC
  Administered 2023-07-20: 15 [IU] via SUBCUTANEOUS
  Filled 2023-07-19: qty 0.15

## 2023-07-19 MED ORDER — POTASSIUM CHLORIDE CRYS ER 20 MEQ PO TBCR
40.0000 meq | EXTENDED_RELEASE_TABLET | Freq: Once | ORAL | Status: AC
  Administered 2023-07-19: 40 meq via ORAL
  Filled 2023-07-19: qty 2

## 2023-07-19 MED ORDER — NYSTATIN 100000 UNIT/GM EX POWD: 1.0000 | Freq: Three times a day (TID) | CUTANEOUS | Status: DC

## 2023-07-19 MED ORDER — UMECLIDINIUM BROMIDE 62.5 MCG/ACT IN AEPB
1.0000 | INHALATION_SPRAY | Freq: Every day | RESPIRATORY_TRACT | Status: DC
  Administered 2023-07-20 – 2023-07-28 (×9): 1 via RESPIRATORY_TRACT
  Filled 2023-07-19 (×2): qty 7

## 2023-07-19 MED ORDER — ACETAMINOPHEN 650 MG RE SUPP: 650.0000 mg | Freq: Four times a day (QID) | RECTAL | Status: DC | PRN

## 2023-07-19 MED ORDER — INSULIN GLARGINE-YFGN 100 UNIT/ML ~~LOC~~ SOLN: 15.0000 [IU] | Freq: Every day | SUBCUTANEOUS | Status: DC

## 2023-07-19 MED ORDER — IPRATROPIUM BROMIDE 0.02 % IN SOLN
0.5000 mg | Freq: Four times a day (QID) | RESPIRATORY_TRACT | Status: DC
  Administered 2023-07-19 – 2023-07-20 (×3): 0.5 mg via RESPIRATORY_TRACT
  Filled 2023-07-19 (×3): qty 2.5

## 2023-07-19 MED ORDER — INSULIN ASPART 100 UNIT/ML IJ SOLN
0.0000 [IU] | Freq: Three times a day (TID) | INTRAMUSCULAR | Status: DC
  Administered 2023-07-20: 15 [IU] via SUBCUTANEOUS
  Administered 2023-07-21: 8 [IU] via SUBCUTANEOUS
  Administered 2023-07-21: 5 [IU] via SUBCUTANEOUS

## 2023-07-19 MED ORDER — FLUTICASONE FUROATE-VILANTEROL 200-25 MCG/ACT IN AEPB
1.0000 | INHALATION_SPRAY | Freq: Every day | RESPIRATORY_TRACT | Status: DC
  Administered 2023-07-20 – 2023-07-28 (×9): 1 via RESPIRATORY_TRACT
  Filled 2023-07-19: qty 28

## 2023-07-19 MED ORDER — MAGNESIUM SULFATE 2 GM/50ML IV SOLN
2.0000 g | INTRAVENOUS | Status: AC
  Administered 2023-07-20: 2 g via INTRAVENOUS
  Filled 2023-07-19: qty 50

## 2023-07-19 MED ORDER — CALCIUM GLUCONATE-NACL 1-0.675 GM/50ML-% IV SOLN
1.0000 g | Freq: Once | INTRAVENOUS | Status: AC
  Administered 2023-07-19: 1000 mg via INTRAVENOUS
  Filled 2023-07-19: qty 50

## 2023-07-19 MED ORDER — ATORVASTATIN CALCIUM 80 MG PO TABS
80.0000 mg | ORAL_TABLET | Freq: Every day | ORAL | Status: DC
  Administered 2023-07-20 – 2023-07-28 (×9): 80 mg via ORAL
  Filled 2023-07-19 (×9): qty 1

## 2023-07-19 MED ORDER — HYDROXYZINE HCL 25 MG PO TABS
25.0000 mg | ORAL_TABLET | Freq: Four times a day (QID) | ORAL | Status: DC | PRN
  Administered 2023-07-20 – 2023-07-25 (×5): 25 mg via ORAL
  Filled 2023-07-19 (×5): qty 1

## 2023-07-19 MED ORDER — PREDNISONE 20 MG PO TABS: 20.0000 mg | ORAL_TABLET | Freq: Every day | ORAL | Status: DC

## 2023-07-19 MED ORDER — POLYETHYLENE GLYCOL 3350 17 G PO PACK: 17.0000 g | PACK | Freq: Every day | ORAL | Status: DC | PRN

## 2023-07-19 MED ORDER — EZETIMIBE 10 MG PO TABS
10.0000 mg | ORAL_TABLET | Freq: Every day | ORAL | Status: DC
  Administered 2023-07-20 – 2023-07-28 (×9): 10 mg via ORAL
  Filled 2023-07-19 (×9): qty 1

## 2023-07-19 MED ORDER — SODIUM CHLORIDE 0.9% FLUSH
3.0000 mL | Freq: Two times a day (BID) | INTRAVENOUS | Status: DC
  Administered 2023-07-20 – 2023-07-27 (×15): 3 mL via INTRAVENOUS

## 2023-07-19 MED ORDER — ALBUTEROL SULFATE (2.5 MG/3ML) 0.083% IN NEBU: 10.0000 mg | INHALATION_SOLUTION | RESPIRATORY_TRACT | Status: DC

## 2023-07-19 MED ORDER — SODIUM CHLORIDE 0.9 % IV SOLN
2.0000 g | INTRAVENOUS | Status: DC
  Administered 2023-07-19 – 2023-07-20 (×2): 2 g via INTRAVENOUS
  Filled 2023-07-19 (×2): qty 20

## 2023-07-19 MED ORDER — SIMETHICONE 80 MG PO CHEW
80.0000 mg | CHEWABLE_TABLET | Freq: Four times a day (QID) | ORAL | Status: DC | PRN
  Administered 2023-07-23 – 2023-07-28 (×3): 80 mg via ORAL
  Filled 2023-07-19 (×3): qty 1

## 2023-07-19 NOTE — ED Triage Notes (Signed)
Pt to ED via EMS from home. Pt c/o increased SOB x a few days. Pt arrives on NRB with EMS. Pt on 6L  baseline. When EMS arrived, pt was at 78% on [facility]. EMS gave 2 duo nebs en route and 125 solumedrol. Pt has hx of COPD, recent dx of pneumonia, and CHF. Pt denies any swelling or recent weight gain. Pt has low grade fever of 99.6 with EMS.    EMS: 94% NRB 18 RAC 125 solumedrol 178/108 82 HR 272 CBG
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
## 2023-07-19 NOTE — ED Provider Notes (Signed)
[place] [facility] AT [facility] Provider Note   CSN: [id number] Arrival date & time: 07/19/23  1752     History  Chief Complaint  Patient presents with   Shortness of Breath    Alicia [name] is a 67 y.o. female.   Shortness of Breath    This patient is a 67 year old female who presents critically ill with hypoxia.  She has a history significant for multiple different abnormalities of her cardiovascular system that predispose her to hypoxia and the need for chronic oxygen therapy.  Infection been seen by pulmonology about 5 days ago, it was noted that she was supposed to be on Trelegy, she was supposed to be on 5 to 6 L of oxygen chronically, it is noted that she has chronic diastolic heart failure, she has essential hypertension and has history of kidney injury.  She has known history of paroxysmal atrial fibrillation as well as fairly severe emphysema.  She had been admitted to the hospital 1 month ago because of acute respiratory failure and hypoxia, during that admission the patient had been admitted on July 23 and discharged on August 5.  It was thought that her respiratory failure was secondary to COPD and congestive heart failure  Last echocardiogram was performed on July 24 showing an ejection fraction of 55 to 60%, grade 3 diastolic dysfunction  The patient endorses feeling like she has a fever, she is coughing up lots of phlegm, she was found to be 78% on 6 L on the paramedics arrival.  They placed her on nonrebreather and gave her 2 DuoNeb's back-to-back with some improvement in her oxygenation.  Home Medications Prior to Admission medications   Medication Sig Start Date End Date Taking? Authorizing Provider  ACCU-CHEK AVIVA PLUS test strip USE TO TEST BLOOD SUGARS AS DIRECTED Patient taking differently: 1 each by Other route See admin instructions. USE TO TEST BLOOD SUGARS AS DIRECTED 10/26/22   [name], MD  amLODipine (NORVASC) 10  MG tablet TAKE 1 TABLET BY MOUTH EVERY DAY 06/11/23   [name], MD  apixaban (ELIQUIS) 5 MG TABS tablet Take 1 tablet (5 mg total) by mouth 2 (two) times daily. 07/08/23 07/07/24  Morene Crocker, MD  atorvastatin (LIPITOR) 80 MG tablet TAKE 1 TABLET BY MOUTH EVERY DAY 06/09/23   Jake Bathe, MD  B-D UF III MINI PEN NEEDLES 31G X 5 MM MISC USE 3 TIMES A DAY WITH HUMALOG 08/10/22   [name], MD  Dulaglutide (TRULICITY) 1.5 MG/0.5ML SOPN Inject 1.5 mg into the skin once a week. 05/03/23   [name], MD  ezetimibe (ZETIA) 10 MG tablet Take 1 tablet (10 mg total) by mouth daily. 09/09/22   Swinyer, Zachary George, NP  Fluticasone-Umeclidin-Vilant (TRELEGY ELLIPTA) 200-62.5-25 MCG/ACT AEPB Inhale 1 puff into the lungs daily. 07/14/23   Glenford Bayley, NP  guaiFENesin-dextromethorphan (ROBITUSSIN DM) 100-10 MG/5ML syrup Take 10 mLs by mouth at bedtime. 06/28/23   Modena Slater, DO  hydrOXYzine (ATARAX) 25 MG tablet Take 1 tablet (25 mg total) by mouth every 6 (six) hours as needed for anxiety. 06/28/23 07/28/23  Tawkaliyar, Roya, DO  insulin glargine (LANTUS) 100 UNIT/ML Solostar Pen Inject 30 Units into the skin daily. 06/28/23   Tawkaliyar, Roya, DO  insulin lispro (HUMALOG) 100 UNIT/ML KwikPen Inject 14 units before breakfast and lunch, inject 20 units before dinner 02/01/23   [name], MD  Insulin Pen Needle 32G X 4 MM MISC Use  to inject insulin 4 times a day. The patient is insulin requiring, ICD 10 code 11.10. The patient injects 4 times per day. 04/28/18   Doneen Poisson, MD  Insulin Syringe-Needle U-100 31G X 15/64" 0.3 ML MISC Use to inject Humalog before meals three times a day 05/31/19   [name], MD  Lancets (ACCU-CHEK MULTICLIX) lancets Use to check your blood sugar four times daily: early morning, before a meal, two hours after a meal, and bedtime 01/16/21   [name], MD  nystatin powder Apply 1 Application topically 3 (three)  times daily. 08/17/22   [name], MD  ondansetron (ZOFRAN) 4 MG tablet Take 1 tablet (4 mg total) by mouth daily as needed for nausea or vomiting. 06/28/23 07/28/23  Tawkaliyar, Roya, DO  phenol (CHLORASEPTIC) 1.4 % LIQD Use as directed 1 spray in the mouth or throat as needed for throat irritation / pain. 06/28/23 07/28/23  Tawkaliyar, Roya, DO  sacubitril-valsartan (ENTRESTO) 24-26 MG Take 1 tablet by mouth 2 (two) times daily. 06/28/23 07/28/23  Tawkaliyar, Roya, DO  senna (SENOKOT) 8.6 MG TABS tablet Take 2 tablets (17.2 mg total) by mouth daily. 04/14/21   [name], MD  simethicone (MYLICON) 80 MG chewable tablet Chew 1 tablet (80 mg total) by mouth every 6 (six) hours as needed for flatulence (bloating). 06/28/23 07/28/23  Tawkaliyar, Roya, DO  spironolactone (ALDACTONE) 25 MG tablet Take 12.5 mg by mouth daily. 07/08/23   [provider]  torsemide (DEMADEX) 20 MG tablet Take 2 tablets (40 mg total) by mouth every morning AND 1 tablet (20 mg total) every evening. 07/14/23   Clegg, Amy D, NP  Wound Dressings (MEDIHONEY WOUND/BURN DRESSING) GEL APPLY TOPICALLY DAILY * OTC ITEM Patient taking differently: Apply 1 application  topically daily. 05/18/23   [name], MD      Allergies    Canagliflozin    Review of Systems   Review of Systems  Respiratory:  Positive for shortness of breath.   All other systems reviewed and are negative.   Physical Exam Updated Vital Signs BP (!) 122/51   Pulse 83   Temp (!) 101.6 F (38.7 C) (Rectal)   Resp (!) 21   Ht 1.702 m (5\' 7" )   Wt 99.3 kg   LMP  (LMP Unknown)   SpO2 97%   BMI 34.30 kg/m  Physical Exam Vitals and nursing note reviewed.  Constitutional:      General: She is in acute distress.     Appearance: She is well-developed. She is ill-appearing.  HENT:     Head: Normocephalic and atraumatic.     Mouth/Throat:     Pharynx: No oropharyngeal exudate.  Eyes:     General: No scleral icterus.        Right eye: No discharge.        Left eye: No discharge.     Conjunctiva/sclera: Conjunctivae normal.     Pupils: Pupils are equal, round, and reactive to light.  Neck:     Thyroid: No thyromegaly.     Vascular: No JVD.  Cardiovascular:     Rate and Rhythm: Regular rhythm. Tachycardia present.     Heart sounds: No murmur heard.    No friction rub. No gallop.  Pulmonary:     Effort: Tachypnea and respiratory distress present.     Breath sounds: Wheezing, rhonchi and rales present.  Abdominal:     General: Bowel sounds are normal. There is no distension.  Palpations: Abdomen is soft. There is no mass.     Tenderness: There is no abdominal tenderness.  Musculoskeletal:        General: No tenderness. Normal range of motion.     Cervical back: Normal range of motion and neck supple.     Right lower leg: No edema.     Left lower leg: No edema.  Lymphadenopathy:     Cervical: No cervical adenopathy.  Skin:    General: Skin is warm and dry.     Findings: No erythema or rash.  Neurological:     Mental Status: She is alert.     Coordination: Coordination normal.  Psychiatric:        Behavior: Behavior normal.     ED Results / Procedures / Treatments   Labs (all labs ordered are listed, but only abnormal results are displayed) Labs Reviewed  RESP PANEL BY RT-PCR (RSV, FLU A&B, COVID)  RVPGX2 - Abnormal; Notable for the following components:      Result Value   SARS Coronavirus 2 by RT PCR POSITIVE (*)    All other components within normal limits  COMPREHENSIVE METABOLIC PANEL - Abnormal; Notable for the following components:   Sodium 133 (*)    Potassium 3.3 (*)    Chloride 90 (*)    Glucose, Bld 242 (*)    Creatinine, Ser 1.07 (*)    Calcium 6.0 (*)    Total Protein 5.7 (*)    Albumin 2.4 (*)    GFR, Estimated 58 (*)    All other components within normal limits  CBC WITH DIFFERENTIAL/PLATELET - Abnormal; Notable for the following components:   RBC 3.40 (*)     Hemoglobin 8.9 (*)    HCT 28.9 (*)    RDW 15.9 (*)    All other components within normal limits  APTT - Abnormal; Notable for the following components:   aPTT 41 (*)    All other components within normal limits  BRAIN NATRIURETIC PEPTIDE - Abnormal; Notable for the following components:   B Natriuretic Peptide 215.2 (*)    All other components within normal limits  I-STAT ARTERIAL BLOOD GAS, ED - Abnormal; Notable for the following components:   pH, Arterial 7.337 (*)    pCO2 arterial 59.1 (*)    Bicarbonate 31.1 (*)    TCO2 33 (*)    Acid-Base Excess 5.0 (*)    Sodium 133 (*)    Potassium 3.1 (*)    Calcium, Ion 0.79 (*)    HCT 28.0 (*)    Hemoglobin 9.5 (*)    All other components within normal limits  CULTURE, BLOOD (ROUTINE X 2)  CULTURE, BLOOD (ROUTINE X 2)  PROTIME-INR  BLOOD GAS, ARTERIAL  I-STAT CG4 LACTIC ACID, ED  I-STAT CG4 LACTIC ACID, ED    EKG EKG Interpretation Date/Time:  Monday July 19 2023 18:13:31 EDT Ventricular Rate:  86 PR Interval:  81 QRS Duration:  106 QT Interval:  400 QTC Calculation: 479 R Axis:   -38  Text Interpretation: Sinus or ectopic atrial rhythm Short PR interval Left axis deviation Probable lateral infarct, age indeterminate Probable anteroseptal infarct, old ST elevation, consider inferior injury Confirmed by Eber Hong ([id number]) on 07/19/2023 6:15:09 PM  Radiology DG Chest Port 1 View  Result Date: 07/19/2023 CLINICAL DATA:  Increasing shortness of breath for several days EXAM: PORTABLE CHEST 1 VIEW COMPARISON:  06/25/2023 FINDINGS: Single frontal view of the chest demonstrates an enlarged cardiac silhouette. Continued central vascular  congestion, with persistent but improving bibasilar consolidation. Trace left pleural effusion. No pneumothorax. IMPRESSION: 1. Constellation of findings most consistent with congestive heart failure, with slight improvement in volume status since prior study. Superimposed pneumonia would be  difficult to exclude. Electronically Signed   By: Sharlet Salina M.D.   On: 07/19/2023 19:05    Procedures .Critical Care  Performed by: Eber Hong, MD Authorized by: Eber Hong, MD   Critical care provider statement:    Critical care time (minutes):  45   Critical care time was exclusive of:  Separately billable procedures and treating other patients and teaching time   Critical care was necessary to treat or prevent imminent or life-threatening deterioration of the following conditions:  Respiratory failure   Critical care was time spent personally by me on the following activities:  Development of treatment plan with patient or surrogate, discussions with consultants, evaluation of patient's response to treatment, examination of patient, obtaining history from patient or surrogate, review of old charts, re-evaluation of patient's condition, pulse oximetry, ordering and review of radiographic studies, ordering and review of laboratory studies and ordering and performing treatments and interventions   I assumed direction of critical care for this patient from another provider in my specialty: no     Care discussed with: admitting provider   Comments:           Medications Ordered in ED Medications  cefTRIAXone (ROCEPHIN) 2 g in sodium chloride 0.9 % 100 mL IVPB (0 g Intravenous Stopped 07/19/23 1930)  azithromycin (ZITHROMAX) 500 mg in sodium chloride 0.9 % 250 mL IVPB (0 mg Intravenous Stopped 07/19/23 2002)  methylPREDNISolone sodium succinate (SOLU-MEDROL) 125 mg/2 mL injection 125 mg (125 mg Intravenous Not Given 07/19/23 1843)  albuterol (PROVENTIL) (2.5 MG/3ML) 0.083% nebulizer solution 10 mg (10 mg Nebulization Not Given 07/19/23 1930)  magnesium sulfate IVPB 2 g 50 mL (0 g Intravenous Stopped 07/19/23 1953)  acetaminophen (TYLENOL) tablet 325 mg (325 mg Oral Given 07/19/23 1929)    ED Course/ Medical Decision Making/ A&P Clinical Course as of 07/19/23 2118  Mon Jul 19, 2023   1807 Blood gas, arterial (at [facility]) [CC]    Clinical Course User Index [CC] Glyn Ade, MD                                 Medical Decision Making Amount and/or Complexity of Data Reviewed Labs: ordered. Radiology: ordered. ECG/medicine tests: ordered.  Risk OTC drugs. Prescription drug management. Decision regarding hospitalization.    This patient presents to the ED for concern of shortness of breath, this involves an extensive number of treatment options, and is a complaint that carries with it a high risk of complications and morbidity.  The differential diagnosis includes pneumonia, pneumothorax, less likely pulmonary embolism given that she is on Eliquis for A-fib, could also be severe COPD or congestive heart failure worsening as well.   Co morbidities that complicate the patient evaluation  Multiple chronic pulmonary conditions and cardiac conditions   Additional history obtained:  Additional history obtained from paramedics and the electronic medical record External records from outside source obtained and reviewed including admission 1 month ago for a prolonged time due to acute on chronic hypoxic respiratory failure   Lab Tests:  I Ordered, and personally interpreted labs.  The pertinent results include: Leukocytosis, hypocalcemia, COVID-19 positive   Imaging Studies ordered:  I ordered imaging studies including chest  x-ray I independently visualized and interpreted imaging which showed bilateral subtle infiltrates either pulmonary edema or possibly infection I agree with the radiologist interpretation   Cardiac Monitoring: / EKG:  The patient was maintained on a cardiac monitor.  I personally viewed and interpreted the cardiac monitored which showed an underlying rhythm of: Initially sinus tachycardia, gradually improved   Consultations Obtained:  I requested consultation with the medicine teaching service,  and discussed lab and imaging  findings as well as pertinent plan - they recommend: Admit, patient may need BiPAP at this time is doing better   Problem List / ED Course / Critical interventions / Medication management  Received multiple different medications for acute respiratory failure including I ordered medication including albuterol, Solu-Medrol, magnesium, antibiotics, high flow oxygen for acute on chronic respiratory failure Reevaluation of the patient after these medicines showed that the patient improved but critically ill I have reviewed the patients home medicines and have made adjustments as needed   Social Determinants of Health:  Very poor lung function   Test / Admission - Considered:  Will need to be admitted to higher level of care, critical care provided         Final Clinical Impression(s) / ED Diagnoses Final diagnoses:  Acute respiratory failure with hypoxia [facility])  COVID-19    Rx / DC Orders ED Discharge Orders     None         Eber Hong, MD 07/19/23 2118

## 2023-07-19 NOTE — ED Notes (Signed)
1st lactic 1.2 @ 18:29:45 in normal range, 2nd not needed

## 2023-07-19 NOTE — Telephone Encounter (Signed)
Pt states "she is having problems"and has pneumonia and wants to speak with the nurse. Please advise
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
## 2023-07-19 NOTE — Sepsis Progress Note (Signed)
Sepsis protocol is being followed by eLink. 

## 2023-07-19 NOTE — ED Notes (Signed)
CRITICAL VALUE STICKER  CRITICAL VALUE: Calcium 6.0  MD NOTIFIED: MD Hyacinth Meeker  TIME OF NOTIFICATION: [phone]
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
## 2023-07-19 NOTE — ED Notes (Signed)
Pt arrived to ED covered in diarrhea. Pt cleaned. Full linen change. Pt in bed resting comfortably.

## 2023-07-19 NOTE — Hospital Course (Addendum)
Alicia [name] is a 67 y.o. person living with a history of COPD, HFrecEF, Paroxsymal Afib, and T2DM who presented with dyspnea and admitted for Acute on Chronic Hypoxic Respiratory Failure likely due to COVID-19 infection   #Acute on Chronic Hypoxic Respiratory Failure likely due to COVID-19 infection #COPD exacerbation due to COVID-19 infection #Viral pneumonia from Covid-19  Patient presented to the emergency department with shortness of breath, productive cough , fever, and chills that started 6 days prior. In the ED, she was requiring increased supplemental O2 with a non-rebreather at 15L and she tested positive for COVID-19. Her acute on chronic respiratory failure was due to COVID 19 virus infection causing a COPD exacerbation and viral pneumonia. Patient was not a candidate for paxlovid or remdesivir due to symptom length of greater than 5 days. Patient started on baricitinib 2mg  daily until hospital discharge. Patient had hospital acquired pneumonia during prior hospitalization, current CXR cannot rule out pneumonia in left lower lobes and it may be showing remnants of her prior hospital acquired penumonia vs new viral pneumonia; however, given the questionable pneumonia during a COPD exacerbation, patient started on rocephin and azithromycin. After procalcitonin came back <0.10 indicating unlikely bacterial infection, rocephin discontinued and azithromycin 500mg  continued for 3 more days to reduce airway inflammation in COPD. Patient started on prednisone 40 mg for 10days, per COVID-19 UpToDate guidelines. For symptomatic relief, patient also on ipratropium nebs BID for SOB, robitussin DM nightly for cough, Tylenol PRN for fever, cepacol and phenol PRN for sore throat. Patient also on Breo-ellipta and incruse ellipta 1 puff each daily -Wean O2 as tolerated, keep O2 saturation between 88-92% to baseline O2 of 3-4L [place] per daughter, Felisicia. -Blood cultures no growth at 3 days -PT/OT evaluated  patient and recommends [facility] PT/OT services at discharge.   #Mildly elevated Cr Baseline Cr around 0.9 per discharge in August. Cr on admission was 1.34. Differentials included poor renal perfusion from HFrEF vs poor PO intake in setting of illness vs poor urine output. Home spironolactone and entresto in setting of increasing Cr.  Patient was instructed PO hydration. No JVD or LE edema on exam and daily weight stable making hypervolemia a cardiorenal cause unlikely. Cr improved.    #Hypocalcemia due to Vitamin D deficiency  #Hypokalemia - resolved #Hypomagnesemia - resolved Patient presented to the ED and was found to have a calcium level of 6.0, corrected to 7.3, and an ionized calcium of 0.79, Magnesium 1.3, and potassium of 3.3. Hypocalcemia, hypokalemia, and hypomagnesemia could likely be due to increase in torsemide dose from 40 mg total to 60 mg total daily on 8/21. Electrolytes were repleted on admission. Follow up renal function panel showed Mg 1.8, potassium 4.7, and corrected calcium 7.4. Electrlytes were repleted once again. On hospital day 2, potassium and Mg normal. Corr Ca 8.4. Vitamin D low at 13.8 and PTH high at 146, suggesting vitamin D deficiency.  -Patient started on Vit D 50,000units 1/week -With gradual improvement in calcium and starting vitamin D, patient may not need supplemental calcium at discharge.   #HFrEF LVEF 55-60% On admission patient denies symptoms of heart failure exacerbation and her weight is stable from one week prior at the cardiology office, making HFrecEF exacerbation less likely. She last saw on cardiology on 07/14/23 and is on GDMT: Spironolactone, entresto, torsemide. Patient started on Torsemide 20mg  on admission instead of home Torsemide 60mg  in setting of electrolyte abnormalities. No JVD, LE edema on exam. Spironolactone and Entresto held with increase in  Cr but slowly restarted once Cr improved.    #T2DM Home regimen of Lantus 15 units, Lispro 14 with  breakfast and lunch, 20 units with dinner. Patient required increased insulin during hospitalization for glucose control. She was hyperglycemic due to stress from Covid infection and prednisone. Glucose was responsive to insulin.  -Semglee 30 units BID, aspart 15units TID, SSI   #Paroxsymal Atrial Fibrillation  Patient was recently diagnosed with paroxsymal Afib during prior hospitalization thought to be caused from the duo nebs containing albuterol. Echo July 2024 noted severe left atrial dilation, which could also cause afib.  -Continue Eliquis 5mg  BID -Avoid Beta2 agonists    #HTN -Continued home amlodipine 10mg    #EKG abnormalities EKG shows ST elevation in leads III and aVF and T wave inversion in lead V1-V2. Patient denied chest pain. ST elevations in leads III and aVF present on previous EKG but T wave inversion are new. EKG abnormalities likely due to electrolyte abnormalities. Follow up EKG normal was normal. ST changes and T wave changes resolved.    #Dyslipidemia Continue home ezetimibe 10mg  daily  and Lipitor 80mg  daily    #Anxiety Continue home hydroxyzine PRN ----------------------------------------------------------------------------------------- Acute on chronic respiratory failure due to Covid-19 infection: Patient was on baricitinib inpatient for ____days, prednisone for ___ days, and azithromycin for 4 days. Patient was febrile at 101.6 on admission but afebrile the remainder of the hospital course. Cough improved significantly. She was able to weaned down on supplemental oxygen need from 15L nonbreather satting 91% to 4L [place] satting 93%. Patient discharged with ___ days of prednisone to finish 10 day course. At appointment please check O2 sat and supplemental oxygen need.   AKI on CKD3a: Baseline Cr around 0.9 per discharge in August. Cr on admission was 1.34. Entresto and Spironolactone held on admission due to AKI. Patient was on reduced dose of Torsemide 20mg  due to  electrolyte abnormalities. AKI improved on hospital day 3 with PO hydration. With improvement in Cr, Entresto restarted for HFrEF and blood pressure control. Please check Cr at appointment.   T2DM: Patient required increased insulin during hospitalization for glucose control. She was hyperglycemic due to stress from Covid infection and prednisone. Glucose was responsive to insulin. Please check fasting glucose at appointment and adjust medications as needed.   Hypocalcemia due to Vitamin D deficiency: Patient presented with hypocalcemia that was persistent despite repletion. Vit D was checked and came back low at 13.8. PTH high at 146. Patient started on Vitamin D 50,000 units once a week.  HFrEF/HTN: Patient's spironolactone and entresto held on admission due to AKI. Patient was on a reduced dose of Torsemide 20mg  due to hypomagnesemia, hypokalemia, and hypocalcemia on admission. Patient restarted on Spironolactone and Entresto at discharge. Please check electrolytes at appointment.  -----------------------------------------------------------------------------------------

## 2023-07-19 NOTE — ED Notes (Signed)
RN called RT and requested RT give neb treatment and draw ABG.

## 2023-07-19 NOTE — Telephone Encounter (Signed)
Error

## 2023-07-19 NOTE — Telephone Encounter (Signed)
Patient called stating that she had pneumonia and is unaware and wanted to discuss. Did advise to contact PCP about the pneumonia

## 2023-07-19 NOTE — Progress Notes (Signed)
ABG results give to MD. Will continue to monitor as needed.

## 2023-07-20 DIAGNOSIS — J9611 Chronic respiratory failure with hypoxia: Secondary | ICD-10-CM | POA: Diagnosis not present

## 2023-07-20 DIAGNOSIS — Z87891 Personal history of nicotine dependence: Secondary | ICD-10-CM

## 2023-07-20 DIAGNOSIS — U071 COVID-19: Secondary | ICD-10-CM

## 2023-07-20 DIAGNOSIS — J9621 Acute and chronic respiratory failure with hypoxia: Secondary | ICD-10-CM

## 2023-07-20 DIAGNOSIS — J1282 Pneumonia due to coronavirus disease 2019: Secondary | ICD-10-CM | POA: Diagnosis not present

## 2023-07-20 DIAGNOSIS — J441 Chronic obstructive pulmonary disease with (acute) exacerbation: Secondary | ICD-10-CM

## 2023-07-20 LAB — BASIC METABOLIC PANEL
Anion gap: 12 (ref 5–15)
BUN: 26 mg/dL — ABNORMAL HIGH (ref 8–23)
CO2: 28 mmol/L (ref 22–32)
Calcium: 7 mg/dL — ABNORMAL LOW (ref 8.9–10.3)
Chloride: 88 mmol/L — ABNORMAL LOW (ref 98–111)
Creatinine, Ser: 1.21 mg/dL — ABNORMAL HIGH (ref 0.44–1.00)
GFR, Estimated: 50 mL/min — ABNORMAL LOW (ref >60)
Glucose, Bld: 466 mg/dL — ABNORMAL HIGH (ref 70–99)
Potassium: 4.3 mmol/L (ref 3.5–5.1)
Sodium: 128 mmol/L — ABNORMAL LOW (ref 135–145)

## 2023-07-20 LAB — RENAL FUNCTION PANEL
Albumin: 2.3 g/dL — ABNORMAL LOW (ref 3.5–5.0)
Anion gap: 16 — ABNORMAL HIGH (ref 5–15)
BUN: 24 mg/dL — ABNORMAL HIGH (ref 8–23)
CO2: 24 mmol/L (ref 22–32)
Calcium: 6 mg/dL — CL (ref 8.9–10.3)
Chloride: 89 mmol/L — ABNORMAL LOW (ref 98–111)
Creatinine, Ser: 1.34 mg/dL — ABNORMAL HIGH (ref 0.44–1.00)
GFR, Estimated: 44 mL/min — ABNORMAL LOW (ref >60)
Glucose, Bld: 584 mg/dL (ref 70–99)
Phosphorus: 4.2 mg/dL (ref 2.5–4.6)
Potassium: 4.7 mmol/L (ref 3.5–5.1)
Sodium: 129 mmol/L — ABNORMAL LOW (ref 135–145)

## 2023-07-20 LAB — GLUCOSE, CAPILLARY
Glucose-Capillary: 568 mg/dL (ref 70–99)
Glucose-Capillary: 571 mg/dL (ref 70–99)
Glucose-Capillary: 507 mg/dL (ref 70–99)
Glucose-Capillary: 403 mg/dL — ABNORMAL HIGH (ref 70–99)
Glucose-Capillary: 310 mg/dL — ABNORMAL HIGH (ref 70–99)

## 2023-07-20 LAB — MAGNESIUM: Magnesium: 1.8 mg/dL (ref 1.7–2.4)

## 2023-07-20 MED ORDER — SODIUM CHLORIDE 0.9 % IV SOLN
4.0000 g | Freq: Once | INTRAVENOUS | Status: AC
  Administered 2023-07-20: 4 g via INTRAVENOUS
  Filled 2023-07-20: qty 40

## 2023-07-20 MED ORDER — BARICITINIB 2 MG PO TABS
2.0000 mg | ORAL_TABLET | Freq: Every day | ORAL | Status: DC
  Administered 2023-07-21 – 2023-07-24 (×4): 2 mg via ORAL
  Filled 2023-07-20 (×5): qty 1

## 2023-07-20 MED ORDER — INSULIN GLARGINE-YFGN 100 UNIT/ML ~~LOC~~ SOLN
30.0000 [IU] | Freq: Every day | SUBCUTANEOUS | Status: DC
  Administered 2023-07-21: 30 [IU] via SUBCUTANEOUS
  Filled 2023-07-20 (×2): qty 0.3

## 2023-07-20 MED ORDER — IPRATROPIUM BROMIDE 0.02 % IN SOLN
0.5000 mg | Freq: Two times a day (BID) | RESPIRATORY_TRACT | Status: DC
  Administered 2023-07-20 – 2023-07-28 (×16): 0.5 mg via RESPIRATORY_TRACT
  Filled 2023-07-20 (×16): qty 2.5

## 2023-07-20 MED ORDER — TORSEMIDE 20 MG PO TABS
20.0000 mg | ORAL_TABLET | Freq: Once | ORAL | Status: AC
  Administered 2023-07-20: 20 mg via ORAL
  Filled 2023-07-20: qty 1

## 2023-07-20 MED ORDER — BARICITINIB 2 MG PO TABS: 4.0000 mg | ORAL_TABLET | Freq: Every day | ORAL | Status: DC

## 2023-07-20 MED ORDER — PREDNISONE 20 MG PO TABS
40.0000 mg | ORAL_TABLET | Freq: Every day | ORAL | Status: DC
  Filled 2023-07-20: qty 2

## 2023-07-20 MED ORDER — INSULIN ASPART 100 UNIT/ML IJ SOLN
15.0000 [IU] | Freq: Once | INTRAMUSCULAR | Status: AC
  Administered 2023-07-20: 15 [IU] via SUBCUTANEOUS

## 2023-07-20 MED ORDER — MAGNESIUM SULFATE 2 GM/50ML IV SOLN
2.0000 g | Freq: Once | INTRAVENOUS | Status: AC
  Administered 2023-07-20: 2 g via INTRAVENOUS
  Filled 2023-07-20: qty 50

## 2023-07-20 MED ORDER — MENTHOL 3 MG MT LOZG
1.0000 | LOZENGE | OROMUCOSAL | Status: DC | PRN
  Administered 2023-07-20 – 2023-07-28 (×3): 3 mg via ORAL
  Filled 2023-07-20 (×2): qty 9

## 2023-07-20 MED ORDER — INSULIN GLARGINE-YFGN 100 UNIT/ML ~~LOC~~ SOLN
15.0000 [IU] | Freq: Once | SUBCUTANEOUS | Status: AC
  Administered 2023-07-20: 15 [IU] via SUBCUTANEOUS
  Filled 2023-07-20: qty 0.15

## 2023-07-20 MED ORDER — PHENOL 1.4 % MT LIQD: 1.0000 | OROMUCOSAL | Status: DC | PRN

## 2023-07-20 NOTE — Progress Notes (Addendum)
Subjective: Alicia [name] is a 67 y.o. person living with a history of COPD, HFrecEF, Paroxsymal Afib, and T2DM who presented with dyspnea and admitted for Acute on Chronic Hypoxic Respiratory Failure likely due to COVID-19 infection   Overnight EKG showed ST elevation in leads III and aVF and T wave inversion in lead V1-V2. ST elevations in leads III and AVF present on previous EKG but T wave inversions are new. Electrolyte abnormalities overnight including hypocalcemia, hypokalemia, and hypomagnesemia; which were repleted overnight.   Patient examined at bedside. Reports breathing ok on 5L [place]. She was satting 87 on 5L [place] and O2 increased 6L. She reports no chest pain, nausea, vomiting. She endorses fevers and chills overnight and abdominal tenderness. Last bowel movement was yesterday. Reports cough without sputum production. Also reports left ear discomfort and wants it to be looked at.   Objective:  Vital signs in last 24 hours: Vitals:   07/19/23 2344 07/20/23 0147 07/20/23 0157 07/20/23 0300  BP: (!) 136/57   (!) 132/59  Pulse: 77 70 72 65  Resp:  20 18 19   Temp: 98.2 F (36.8 C)   98 F (36.7 C)  TempSrc:    Oral  SpO2: 99% 95% 91% 94%  Weight:      Height:       Weight change:   Intake/Output Summary (Last 24 hours) at 07/20/2023 0804 Last data filed at 07/20/2023 0500 Gross per 24 hour  Intake --  Output 200 ml  Net -200 ml   Physical Exam: General: NAD, alert, cooperative.  HEENT: NCAT, vision grossly intact, sclera anicteric, moist mucous membrane. No [place] sign present on cheek tapping Neck: Supple, trachea midline, no JVD.  Lungs: normal respiratory effort on 5L [place]. No wheezes. Crackles in bilateral lower lobes.  Heart: RRR, no M/R/G. Radial pulses 2+  Abdomen: +BS. Soft. Tender to palpation in upper mid epigastric area. No rebound tenderness. No guarding. No gross abnormality on inspection.  Extremities: No LE edema. Neuro: alert and oriented. Nonfocal.   Skin: warm, dry, good turgor.  Psych: cooperative, appropriate mood and affect.   Assessment/Plan:  Principal Problem:   Acute on chronic respiratory failure (HCC) Active Problems:   COPD exacerbation (HCC)   COVID-19 virus infection   Pneumonia  #Acute on Chronic Hypoxic Respiratory Failure likely due to COVID-19 infection #COPD exacerbation due to COVID-19 infection #Viral pneumonia from Covid-19  -Started on baricitinib 2mg  daily for 14 days, or until hospital discharge, whichever comes first  -Patient had HAP during prior hospitalization, current CXR cannot rule out pneumonia in left lower lobes and it may be showing remnants of HAP vs new viral pneumonia; however, given the questionable pneumonia during a COPD exacerbation, continue rocephin and azithromycin. Check procalcitonin.  -Continue prednisone 40 mg for 10days, per COVID-19 UTD guidelines -Continue ipratropium nebs prn BID for SOB, robitussin DM nightly for cough, Tylenol PRN for fever, cepacol and phenol PRN for sore throat -Begin Breo-ellipta and incruse ellipta 1 puff each daily -Wean O2 as tolerated, keep O2 saturation between 88-92% to baseline O2 of 5L  -Blood cultures pending -PT/OT evaluated patient and pt needs ongoing acute PT/OT services    #Hypocalcemia  #Hypokalemia #Hypomagnesemia  Could be due to torsemide considering torsemide dose at home was increased from 40mg  to 60mg . Corrected calcium 7.3, Magnesium 1.3, and potassium of 3.3 on admission and repleted. Follow up renal function panel showed Mg 1.8, potassium 4.7, and corrected calcium 7.4.  -Continue cardiac telemetry -Repleted with  2g Mg and and 4g calcium gluconate -discharge with oral calcium with torsemide    #HFrEF LVEF 55-60% Last seen on cardiology on 07/14/23 and is currently on GDMT: Spironolactone, entresto, torsemide -Continue Spironolactone 12.5mg  and Entresto 24-26mg  -Torsemide 20mg  for now in setting of electrolyte abnormalities. No  JVD, LE edema on exam.   #T2DM Home regimen of Lantus 15 units, Lispro 14 with breakfast and lunch, 20 units with dinner. Patient's CBG was 568 this AM before breakfast and was given 15units aspart. An hour later CBG 571 and given another 15units aspart and 15 units semglee. CBG 3 hours later 507. Patient has not received prednisone. -Diet changed to carb modified/heart healthy -Another 15units aspart   #Paroxsymal Atrial Fibrillation  Patient was recently diagnosed with paroxsymal Afib during prior hospitalization thought to be caused from the duo nebs containing albuterol. Echo July 2024 noted severe left atrial dilation, which could also cause afib.  -Continue Eliquis 5mg  BID -Avoid Beta2 agonists    #HTN -Hold amlodipine, BP stable -Can restart is she becomes hypertensive    #EKG abnormalities EKG shows ST elevation in leads III and aVF and T wave inversion in lead V1-V2. Patient denied chest pain. ST elevations in leads III and aVF present on previous EKG but T wave inversion are new. EKG abnormalities likely due to electrolyte abnormalities. -Follow up EKG pending  -Continue cardiac telemetry   #Dyslipidemia Continue home ezetimibe 10mg   daily  #Anxiety Continue home hydroxyzine   Best Practice: Diet: heart healthy/carb modified IVF: none VTE: Eliquis  AB: rocephin, azithromycin  Therapy Recs: PT/OT evaluated, needs continued PT/OT Family Contact: Daughter, [name], updated today   Prior to Admission Living Arrangement: Home Anticipated Discharge Location: Home Barriers to Discharge: pending resolution of acute on chronic hypoxic respiratory failure and electrolyte abnormalities  Dispo: Anticipated discharge in approximately 1-3 days to home    LOS: 1 day  Lang Snow, Medical Student 07/20/2023, 8:04 AM

## 2023-07-20 NOTE — Evaluation (Signed)
Occupational Therapy Evaluation Patient Details Name: Alicia [name] MRN: [id number] DOB: 11/09/1956 Today's Date: 07/20/2023   History of Present Illness Pt is a 67 y/o F admitted on 07/19/23 after presenting with c/o SOB, fevers & productive cough x 6 days. Pt tested positive for COVID-19. PMH: severe COPD on chronic 5L, HFrEF, DM2, paroxysmal a-fib on AC, CKD 3A, HTN, chronic anemia   Clinical Impression   Patient reports living at home with her daughter and grandchildren in a 2 level home with 1 STE and mod I for using rollator for functional mobility.  Patient reports being Independent in ADLd and IADLs prior to getting sick and also drives.  Patient currently requires min A for supine to sit with HOIB along with minA  for ADLs and demonstrated decreased overall activity tolerance during ADLs requires seated option to complete grooming task at sink.  Patient completed short functional mobility in room with RW although fatigues quickly and requires seated rest breaks.  Patient found in room supine in bed with clear 02 tubing and on 7 liters 02, bedside RN notified and therapists switched to green high flow O2 tubing.  Patient would benefit from additional OT intervention to address functional deficits listed above in order for patient to return home to PLOF.      If plan is discharge home, recommend the following: Assistance with cooking/housework;Assist for transportation;Help with stairs or ramp for entrance;Direct supervision/assist for medications management;Direct supervision/assist for financial management;A little help with bathing/dressing/bathroom    Functional Status Assessment  Patient has had a recent decline in their functional status and demonstrates the ability to make significant improvements in function in a reasonable and predictable amount of time.  Equipment Recommendations  None recommended by OT (has recommended DME)    Recommendations for Other Services        Precautions / Restrictions Precautions Precautions: Fall Precaution Comments: monitor HR and sats, 4LO2 via HFNC SPO2 89-100% throughout Restrictions Weight Bearing Restrictions: No      Mobility Bed Mobility Overal bed mobility: Needs Assistance Bed Mobility: Supine to Sit   Sidelying to sit: Min assist, HOB elevated Supine to sit: Min assist, Used rails, HOB elevated Sit to supine: Supervision        Transfers Overall transfer level: Needs assistance Equipment used: Rolling walker (2 wheels) Transfers: Sit to/from Stand Sit to Stand: Supervision     Step pivot transfers: Supervision     General transfer comment: STS from EOB, standard chair, educational cuing x 1 time for safe hand placement during STS      Balance Overall balance assessment: Needs assistance Sitting-balance support: Feet supported, Bilateral upper extremity supported Sitting balance-Leahy Scale: Good                                     ADL either performed or assessed with clinical judgement   ADL Overall ADL's : Needs assistance/impaired Eating/Feeding: Set up;Sitting   Grooming: Oral care;Wash/dry face;Set up;Sitting   Upper Body Bathing: Minimal assistance;Sitting   Lower Body Bathing: Moderate assistance;Sitting/lateral leans   Upper Body Dressing : Minimal assistance;Sitting   Lower Body Dressing: Minimal assistance;Sitting/lateral leans   Toilet Transfer: Contact guard assist;Rollator (4 wheels) Toilet Transfer Details (indicate cue type and reason): simulated to recliner Toileting- Clothing Manipulation and Hygiene: Minimal assistance       Functional mobility during ADLs: Contact guard assist;Rolling walker (2 wheels)  Vision Baseline Vision/History: 0 No visual deficits Patient Visual Report: No change from baseline       Perception         Praxis         Pertinent Vitals/Pain Pain Assessment Pain Assessment: 0-10 Pain Score: 8  Faces  Pain Scale: Hurts whole lot Breathing: normal Negative Vocalization: none Facial Expression: smiling or inexpressive Body Language: relaxed Consolability: no need to console PAINAD Score: 0 Pain Location: L temple HA standing at sink, throat Pain Descriptors / Indicators: Discomfort, Grimacing, Throbbing Pain Intervention(s): Monitored during session     Extremity/Trunk Assessment Upper Extremity Assessment Upper Extremity Assessment: Generalized weakness;Left hand dominant   Lower Extremity Assessment Lower Extremity Assessment: Defer to PT evaluation       Communication Communication Communication: Hearing impairment   Cognition Arousal: Alert Behavior During Therapy: WFL for tasks assessed/performed Overall Cognitive Status: Within Functional Limits for tasks assessed                                 General Comments: very pleasant & appreciative     General Comments  PT educated pt on importance of OOB mobility & upright posture vs lying down. PT also reviewed use of acapella flutter valve with pt able to verbalize how to use device.    Exercises     Shoulder Instructions      Home Living Family/patient expects to be discharged to:: Private residence Living Arrangements: Children Available Help at Discharge: Family;Available PRN/intermittently Type of Home: House Home Access: Level entry   Entrance Stairs-Rails: Right;Left Home Layout: Two level;Bed/bath upstairs Alternate Level Stairs-Number of Steps: 14 Alternate Level Stairs-Rails: Right Bathroom Shower/Tub: Chief Strategy Officer: Standard     Home Equipment: Rollator (4 wheels);Cane - single point;Grab bars - tub/shower   Additional Comments: daughter works and grandchildren in school, home alone      Prior Functioning/Environment Prior Level of Function : Independent/Modified Independent             Mobility Comments: crawls up the stairs at home, uses RW, was  receiving HHPT since last d/c ADLs Comments: Independent in ADLs and IADLs        OT Problem List: Decreased strength;Decreased range of motion;Decreased activity tolerance;Impaired balance (sitting and/or standing);Decreased safety awareness;Cardiopulmonary status limiting activity      OT Treatment/Interventions: Self-care/ADL training;Therapeutic exercise;Neuromuscular education;Energy conservation;DME and/or AE instruction;Therapeutic activities;Patient/family education    OT Goals(Current goals can be found in the care plan section) Acute Rehab OT Goals OT Goal Formulation: With patient Time For Goal Achievement: 08/03/23 Potential to Achieve Goals: Good  OT Frequency: Min 1X/week    Co-evaluation PT/OT/SLP Co-Evaluation/Treatment: Yes Reason for Co-Treatment: For patient/therapist safety PT goals addressed during session: Mobility/safety with mobility;Balance OT goals addressed during session: ADL's and self-care      AM-PAC OT "6 Clicks" Daily Activity     Outcome Measure Help from another person eating meals?: None Help from another person taking care of personal grooming?: A Little Help from another person toileting, which includes using toliet, bedpan, or urinal?: A Little   Help from another person to put on and taking off regular upper body clothing?: A Little Help from another person to put on and taking off regular lower body clothing?: A Little 6 Click Score: 16   End of Session Equipment Utilized During Treatment: Oxygen;Rollator (4 wheels) Nurse Communication: Mobility status  Activity Tolerance: Patient tolerated  treatment well Patient left: in chair;with call bell/phone within reach;with chair alarm set  OT Visit Diagnosis: Unsteadiness on feet (R26.81);Other abnormalities of gait and mobility (R26.89);Muscle weakness (generalized) (M62.81)                Time: 6295-2841 OT Time Calculation (min): 43 min Charges:  OT General Charges $OT Visit: 1  Visit OT Evaluation $OT Eval Moderate Complexity: 1 Mod OT Treatments $Self Care/Home Management : 23-37 mins  Governor Specking OT/L  Denice Paradise 07/20/2023, 1:53 PM

## 2023-07-20 NOTE — Evaluation (Signed)
Physical Therapy Evaluation Patient Details Name: Alicia [name] MRN: [id number] DOB: 11/05/1956 Today's Date: 07/20/2023  History of Present Illness  Pt is a 67 y/o F admitted on 07/19/23 after presenting with c/o SOB, fevers & productive cough x 6 days. Pt tested positive for COVID-19. PMH: severe COPD on chronic 5L, HFrEF, DM2, paroxysmal a-fib on AC, CKD 3A, HTN, chronic anemia  Clinical Impression  Pt seen for PT evaluation with pt agreeable to tx, co-tx with OT already present in room. Pt presents with BUE/BLE tremors that she reports began when she started taking steroids during last hospital admission. Pt is able to complete bed mobility with min assist on this date, STS with supervision, & short distance gait in room with RW & CGA. Pt is limited by significant fatigue with mobility. Pt also received on 7L/min O2 via nasal cannula (clear tubing with therapists notifying nurse & switching out to green tubing). Pt with SpO2 dropping as low as 86% with mobility but able to recover to >/= 90% with seated rest break (one instance of 73% but pt denying SOB so question accuracy of reading). Pt would benefit from ongoing acute PT services to address strengthening, endurance, & balance to increase independence with mobility & reduce fall risk.        If plan is discharge home, recommend the following: Assist for transportation;Help with stairs or ramp for entrance;A little help with walking and/or transfers;A little help with bathing/dressing/bathroom;Assistance with cooking/housework   Can travel by private vehicle   Yes    Equipment Recommendations None recommended by PT  Recommendations for Other Services       Functional Status Assessment Patient has had a recent decline in their functional status and demonstrates the ability to make significant improvements in function in a reasonable and predictable amount of time.     Precautions / Restrictions Precautions Precautions:  Fall Restrictions Weight Bearing Restrictions: No      Mobility  Bed Mobility Overal bed mobility: Needs Assistance Bed Mobility: Supine to Sit     Supine to sit: Min assist, Used rails, HOB elevated (assistance to upright trunk)          Transfers Overall transfer level: Needs assistance Equipment used: Rolling walker (2 wheels) Transfers: Sit to/from Stand Sit to Stand: Supervision   Step pivot transfers: Supervision (with RW)       General transfer comment: STS from EOB, standard chair, educational cuing x 1 time for safe hand placement during STS    Ambulation/Gait Ambulation/Gait assistance: Contact guard assist Gait Distance (Feet): 6 Feet Assistive device: Rolling walker (2 wheels) Gait Pattern/deviations: Decreased step length - left, Decreased step length - right, Decreased stride length Gait velocity: decreased     General Gait Details: Pt ambulates bed>sink with RW & CGA.  Stairs            Wheelchair Mobility     Tilt Bed    Modified Rankin (Stroke Patients Only)       Balance Overall balance assessment: Needs assistance Sitting-balance support: Feet supported, Bilateral upper extremity supported Sitting balance-Leahy Scale: Good     Standing balance support: During functional activity, Bilateral upper extremity supported, Reliant on assistive device for balance Standing balance-Leahy Scale: Fair                               Pertinent Vitals/Pain Pain Assessment Pain Assessment: Faces Faces Pain Scale: Hurts whole lot Pain  Location: L temple HA standing at sink Pain Descriptors / Indicators: Discomfort, Grimacing, Throbbing Pain Intervention(s): Monitored during session (pt took seated rest break & reports pain went away within ~1 minute)    Home Living Family/patient expects to be discharged to:: Private residence Living Arrangements: Children Available Help at Discharge: Family;Available  PRN/intermittently Type of Home: House Home Access: Level entry     Alternate Level Stairs-Number of Steps: 14 Home Layout: Two level;Bed/bath upstairs Home Equipment: Rollator (4 wheels);Cane - single point;Grab bars - tub/shower      Prior Function Prior Level of Function : Independent/Modified Independent             Mobility Comments: crawls up the stairs at home, uses RW, was receiving HHPT since last d/c       Extremity/Trunk Assessment   Upper Extremity Assessment Upper Extremity Assessment: Generalized weakness    Lower Extremity Assessment Lower Extremity Assessment: Generalized weakness       Communication   Communication Communication: Hearing impairment (HOH)  Cognition Arousal: Alert Behavior During Therapy: WFL for tasks assessed/performed Overall Cognitive Status: Within Functional Limits for tasks assessed                                 General Comments: very pleasant & appreciative        General Comments General comments (skin integrity, edema, etc.): PT educated pt on importance of OOB mobility & upright posture vs lying down. PT also reviewed use of acapella flutter valve with pt able to verbalize how to use device.    Exercises     Assessment/Plan    PT Assessment Patient needs continued PT services  PT Problem List Decreased strength;Decreased range of motion;Decreased coordination;Cardiopulmonary status limiting activity;Decreased activity tolerance;Decreased knowledge of use of DME;Decreased balance;Decreased safety awareness;Decreased knowledge of precautions;Decreased mobility       PT Treatment Interventions DME instruction;Balance training;Gait training;Neuromuscular re-education;Stair training;Functional mobility training;Patient/family education;Therapeutic activities;Therapeutic exercise;Manual techniques    PT Goals (Current goals can be found in the Care Plan section)  Acute Rehab PT Goals Patient Stated  Goal: feel better PT Goal Formulation: With patient Time For Goal Achievement: 08/03/23 Potential to Achieve Goals: Good    Frequency Min 1X/week     Co-evaluation PT/OT/SLP Co-Evaluation/Treatment: Yes Reason for Co-Treatment: For patient/therapist safety PT goals addressed during session: Mobility/safety with mobility;Balance         AM-PAC PT "6 Clicks" Mobility  Outcome Measure Help needed turning from your back to your side while in a flat bed without using bedrails?: None Help needed moving from lying on your back to sitting on the side of a flat bed without using bedrails?: A Little Help needed moving to and from a bed to a chair (including a wheelchair)?: A Little Help needed standing up from a chair using your arms (e.g., wheelchair or bedside chair)?: A Little Help needed to walk in hospital room?: A Little Help needed climbing 3-5 steps with a railing? : A Lot 6 Click Score: 18    End of Session Equipment Utilized During Treatment: Oxygen Activity Tolerance: Patient limited by fatigue Patient left: in chair;with chair alarm set;with call bell/phone within reach Nurse Communication: Mobility status (O2) PT Visit Diagnosis: Other abnormalities of gait and mobility (R26.89);Difficulty in walking, not elsewhere classified (R26.2);Muscle weakness (generalized) (M62.81);Unsteadiness on feet (R26.81)    Time: 1610-9604 PT Time Calculation (min) (ACUTE ONLY): 39 min   Charges:  PT Evaluation $PT Eval Low Complexity: 1 Low   PT General Charges $$ ACUTE PT VISIT: 1 Visit         Aleda Grana, PT, DPT 07/20/23, 11:49 AM   Sandi Mariscal 07/20/2023, 11:47 AM
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
## 2023-07-20 NOTE — Progress Notes (Signed)
Late note:  Pt monitored frequently throughout dayshift for high blood glucose levels and low Ca+. MD aware and ordered frequent cbg's with one hour follow up. Pt concerned and refused prednisone and omuliant. Md aware. Md resumed AC/HS ssi at end of shift. Pt choked on jello at supper and states that she frequently has cough in evening.  Pt agreeable to POC. Thomas Hoff, RN

## 2023-07-21 DIAGNOSIS — I480 Paroxysmal atrial fibrillation: Secondary | ICD-10-CM | POA: Diagnosis present

## 2023-07-21 DIAGNOSIS — E785 Hyperlipidemia, unspecified: Secondary | ICD-10-CM | POA: Diagnosis present

## 2023-07-21 DIAGNOSIS — I130 Hypertensive heart and chronic kidney disease with heart failure and stage 1 through stage 4 chronic kidney disease, or unspecified chronic kidney disease: Secondary | ICD-10-CM | POA: Diagnosis present

## 2023-07-21 DIAGNOSIS — U071 COVID-19: Secondary | ICD-10-CM | POA: Diagnosis present

## 2023-07-21 DIAGNOSIS — J9621 Acute and chronic respiratory failure with hypoxia: Secondary | ICD-10-CM | POA: Diagnosis present

## 2023-07-21 DIAGNOSIS — R0989 Other specified symptoms and signs involving the circulatory and respiratory systems: Secondary | ICD-10-CM | POA: Diagnosis not present

## 2023-07-21 DIAGNOSIS — E1122 Type 2 diabetes mellitus with diabetic chronic kidney disease: Secondary | ICD-10-CM | POA: Diagnosis present

## 2023-07-21 DIAGNOSIS — J189 Pneumonia, unspecified organism: Secondary | ICD-10-CM | POA: Diagnosis not present

## 2023-07-21 DIAGNOSIS — I472 Ventricular tachycardia, unspecified: Secondary | ICD-10-CM | POA: Diagnosis not present

## 2023-07-21 DIAGNOSIS — D631 Anemia in chronic kidney disease: Secondary | ICD-10-CM | POA: Diagnosis present

## 2023-07-21 DIAGNOSIS — J9601 Acute respiratory failure with hypoxia: Secondary | ICD-10-CM | POA: Diagnosis present

## 2023-07-21 DIAGNOSIS — E1165 Type 2 diabetes mellitus with hyperglycemia: Secondary | ICD-10-CM | POA: Diagnosis present

## 2023-07-21 DIAGNOSIS — R918 Other nonspecific abnormal finding of lung field: Secondary | ICD-10-CM | POA: Diagnosis not present

## 2023-07-21 DIAGNOSIS — J441 Chronic obstructive pulmonary disease with (acute) exacerbation: Secondary | ICD-10-CM | POA: Diagnosis present

## 2023-07-21 DIAGNOSIS — Z794 Long term (current) use of insulin: Secondary | ICD-10-CM | POA: Diagnosis not present

## 2023-07-21 DIAGNOSIS — E876 Hypokalemia: Secondary | ICD-10-CM | POA: Diagnosis present

## 2023-07-21 DIAGNOSIS — I700 Atherosclerosis of aorta: Secondary | ICD-10-CM | POA: Diagnosis not present

## 2023-07-21 DIAGNOSIS — N179 Acute kidney failure, unspecified: Secondary | ICD-10-CM | POA: Diagnosis not present

## 2023-07-21 DIAGNOSIS — I5032 Chronic diastolic (congestive) heart failure: Secondary | ICD-10-CM | POA: Diagnosis present

## 2023-07-21 DIAGNOSIS — J1282 Pneumonia due to coronavirus disease 2019: Secondary | ICD-10-CM | POA: Diagnosis present

## 2023-07-21 DIAGNOSIS — Z9981 Dependence on supplemental oxygen: Secondary | ICD-10-CM | POA: Diagnosis not present

## 2023-07-21 DIAGNOSIS — I129 Hypertensive chronic kidney disease with stage 1 through stage 4 chronic kidney disease, or unspecified chronic kidney disease: Secondary | ICD-10-CM | POA: Diagnosis not present

## 2023-07-21 DIAGNOSIS — F419 Anxiety disorder, unspecified: Secondary | ICD-10-CM | POA: Diagnosis present

## 2023-07-21 DIAGNOSIS — J439 Emphysema, unspecified: Secondary | ICD-10-CM | POA: Diagnosis present

## 2023-07-21 DIAGNOSIS — N1831 Chronic kidney disease, stage 3a: Secondary | ICD-10-CM | POA: Diagnosis present

## 2023-07-21 DIAGNOSIS — E559 Vitamin D deficiency, unspecified: Secondary | ICD-10-CM | POA: Diagnosis present

## 2023-07-21 DIAGNOSIS — Z79899 Other long term (current) drug therapy: Secondary | ICD-10-CM | POA: Diagnosis not present

## 2023-07-21 DIAGNOSIS — Z87891 Personal history of nicotine dependence: Secondary | ICD-10-CM | POA: Diagnosis not present

## 2023-07-21 DIAGNOSIS — J440 Chronic obstructive pulmonary disease with acute lower respiratory infection: Secondary | ICD-10-CM | POA: Diagnosis present

## 2023-07-21 DIAGNOSIS — R0902 Hypoxemia: Secondary | ICD-10-CM | POA: Diagnosis not present

## 2023-07-21 LAB — RENAL FUNCTION PANEL
Albumin: 2.2 g/dL — ABNORMAL LOW (ref 3.5–5.0)
Anion gap: 9 (ref 5–15)
BUN: 28 mg/dL — ABNORMAL HIGH (ref 8–23)
CO2: 30 mmol/L (ref 22–32)
Calcium: 6.9 mg/dL — ABNORMAL LOW (ref 8.9–10.3)
Chloride: 91 mmol/L — ABNORMAL LOW (ref 98–111)
Creatinine, Ser: 1.39 mg/dL — ABNORMAL HIGH (ref 0.44–1.00)
GFR, Estimated: 42 mL/min — ABNORMAL LOW (ref >60)
Glucose, Bld: 243 mg/dL — ABNORMAL HIGH (ref 70–99)
Phosphorus: 3.5 mg/dL (ref 2.5–4.6)
Potassium: 4 mmol/L (ref 3.5–5.1)
Sodium: 130 mmol/L — ABNORMAL LOW (ref 135–145)

## 2023-07-21 LAB — CBC
HCT: 28.3 % — ABNORMAL LOW (ref 36.0–46.0)
Hemoglobin: 8.9 g/dL — ABNORMAL LOW (ref 12.0–15.0)
MCH: 25.8 pg — ABNORMAL LOW (ref 26.0–34.0)
MCHC: 31.4 g/dL (ref 30.0–36.0)
MCV: 82 fL (ref 80.0–100.0)
Platelets: 366 10*3/uL (ref 150–400)
RBC: 3.45 MIL/uL — ABNORMAL LOW (ref 3.87–5.11)
RDW: 16.1 % — ABNORMAL HIGH (ref 11.5–15.5)
WBC: 10.6 10*3/uL — ABNORMAL HIGH (ref 4.0–10.5)
nRBC: 0.2 % (ref 0.0–0.2)

## 2023-07-21 LAB — VITAMIN D 25 HYDROXY (VIT D DEFICIENCY, FRACTURES): Vit D, 25-Hydroxy: 13.8 ng/mL — ABNORMAL LOW (ref 30–100)

## 2023-07-21 LAB — GLUCOSE, CAPILLARY
Glucose-Capillary: 221 mg/dL — ABNORMAL HIGH (ref 70–99)
Glucose-Capillary: 289 mg/dL — ABNORMAL HIGH (ref 70–99)
Glucose-Capillary: 367 mg/dL — ABNORMAL HIGH (ref 70–99)
Glucose-Capillary: 466 mg/dL — ABNORMAL HIGH (ref 70–99)
Glucose-Capillary: 531 mg/dL (ref 70–99)

## 2023-07-21 LAB — MAGNESIUM: Magnesium: 2.3 mg/dL (ref 1.7–2.4)

## 2023-07-21 LAB — PROCALCITONIN: Procalcitonin: <0.1 ng/mL

## 2023-07-21 MED ORDER — CARBAMIDE PEROXIDE 6.5 % OT SOLN
5.0000 [drp] | Freq: Two times a day (BID) | OTIC | Status: DC
  Administered 2023-07-22 – 2023-07-28 (×11): 5 [drp] via OTIC
  Filled 2023-07-21 (×2): qty 15

## 2023-07-21 MED ORDER — INSULIN ASPART 100 UNIT/ML IJ SOLN
0.0000 [IU] | Freq: Every day | INTRAMUSCULAR | Status: DC
  Administered 2023-07-21: 5 [IU] via SUBCUTANEOUS
  Administered 2023-07-22: 4 [IU] via SUBCUTANEOUS
  Administered 2023-07-24 – 2023-07-25 (×2): 3 [IU] via SUBCUTANEOUS
  Administered 2023-07-26: 5 [IU] via SUBCUTANEOUS

## 2023-07-21 MED ORDER — IPRATROPIUM BROMIDE 0.02 % IN SOLN
0.5000 mg | Freq: Once | RESPIRATORY_TRACT | Status: AC
  Administered 2023-07-25: 0.5 mg via RESPIRATORY_TRACT

## 2023-07-21 MED ORDER — INSULIN GLARGINE-YFGN 100 UNIT/ML ~~LOC~~ SOLN
35.0000 [IU] | Freq: Every day | SUBCUTANEOUS | Status: DC
  Administered 2023-07-22: 35 [IU] via SUBCUTANEOUS
  Filled 2023-07-21: qty 0.35

## 2023-07-21 MED ORDER — PREDNISONE 20 MG PO TABS
40.0000 mg | ORAL_TABLET | Freq: Every day | ORAL | Status: AC
  Administered 2023-07-21 – 2023-07-28 (×8): 40 mg via ORAL
  Filled 2023-07-21 (×8): qty 2

## 2023-07-21 MED ORDER — AZITHROMYCIN 500 MG PO TABS
500.0000 mg | ORAL_TABLET | Freq: Every day | ORAL | Status: AC
  Administered 2023-07-21 – 2023-07-22 (×2): 500 mg via ORAL
  Filled 2023-07-21 (×2): qty 1

## 2023-07-21 MED ORDER — INSULIN ASPART 100 UNIT/ML IJ SOLN
5.0000 [IU] | Freq: Once | INTRAMUSCULAR | Status: AC
  Administered 2023-07-22: 5 [IU] via SUBCUTANEOUS

## 2023-07-21 MED ORDER — INSULIN ASPART 100 UNIT/ML IJ SOLN
5.0000 [IU] | Freq: Once | INTRAMUSCULAR | Status: AC
  Administered 2023-07-21: 5 [IU] via SUBCUTANEOUS

## 2023-07-21 MED ORDER — VITAMIN D (ERGOCALCIFEROL) 1.25 MG (50000 UNIT) PO CAPS
50000.0000 [IU] | ORAL_CAPSULE | ORAL | Status: DC
  Administered 2023-07-21: 50000 [IU] via ORAL
  Filled 2023-07-21 (×2): qty 1

## 2023-07-21 MED ORDER — INSULIN ASPART 100 UNIT/ML IJ SOLN: 5.0000 [IU] | Freq: Once | INTRAMUSCULAR | Status: DC

## 2023-07-21 MED ORDER — INSULIN ASPART 100 UNIT/ML IJ SOLN
0.0000 [IU] | Freq: Three times a day (TID) | INTRAMUSCULAR | Status: DC
  Administered 2023-07-21: 20 [IU] via SUBCUTANEOUS
  Administered 2023-07-22: 11 [IU] via SUBCUTANEOUS
  Administered 2023-07-22: 7 [IU] via SUBCUTANEOUS
  Administered 2023-07-22: 20 [IU] via SUBCUTANEOUS
  Administered 2023-07-23: 4 [IU] via SUBCUTANEOUS
  Administered 2023-07-23: 7 [IU] via SUBCUTANEOUS
  Administered 2023-07-23: 11 [IU] via SUBCUTANEOUS
  Administered 2023-07-24 (×2): 7 [IU] via SUBCUTANEOUS
  Administered 2023-07-25: 4 [IU] via SUBCUTANEOUS
  Administered 2023-07-25: 20 [IU] via SUBCUTANEOUS
  Administered 2023-07-26 – 2023-07-27 (×2): 7 [IU] via SUBCUTANEOUS

## 2023-07-21 MED ORDER — AMLODIPINE BESYLATE 10 MG PO TABS
10.0000 mg | ORAL_TABLET | Freq: Every day | ORAL | Status: DC
  Administered 2023-07-21 – 2023-07-28 (×8): 10 mg via ORAL
  Filled 2023-07-21 (×8): qty 1

## 2023-07-21 NOTE — Inpatient Diabetes Management (Signed)
Inpatient Diabetes Program Recommendations  AACE/ADA: New Consensus Statement on Inpatient Glycemic Control (2015)  Target Ranges:  Prepandial:   less than 140 mg/dL      Peak postprandial:   less than 180 mg/dL (1-2 hours)      Critically ill patients:  140 - 180 mg/dL   Lab Results  Component Value Date   GLUCAP 289 (H) 07/21/2023   HGBA1C 8.2 (A) 05/03/2023    Review of Glycemic Control  Latest Reference Range & Units 07/21/23 05:36 07/21/23 11:04  Glucose-Capillary 70 - 99 mg/dL 161 (H) 096 (H)  (H): Data is abnormally high  Diabetes history: DM2 Outpatient Diabetes medications:  Trulicity 1.5 mg weekly Lantus 30 units every day Novolog 14 units with BF and lunch and 15 units with dinner Current orders for Inpatient glycemic control:  Semglee 30 units every day Novolog 0-15 units TID and 0-5 units at bedtime Prednisone 40 mg every morning  Inpatient Diabetes Program Recommendations:    Please consider:  Semglee 35 units every day Novolog 3 units TID with meals if she consumes at least 50%  Will continue to follow while inpatient.  Thank you, Dulce Sellar, MSN, CDCES Diabetes Coordinator Inpatient Diabetes Program [phone] (team pager from 8a-5p)

## 2023-07-21 NOTE — Progress Notes (Addendum)
Subjective: Alicia [name] is a 66 y.o. person living with a history of COPD, HFrecEF, Paroxsymal Afib, and T2DM who presented with dyspnea and admitted for Acute on Chronic Hypoxic Respiratory Failure likely due to COVID-19 infection   Overnight patient had 3 beats of Vtach on cardiac telemetry around 2AM that resolved without intervention. In addition, nursing staff reported that she choked on jello last night around 11PM and patient's O2 Sat dropped to mid 80s% around 3AM and she complained of feeling like something was stuck in her throat and her O2 was increased from 5L [place] to 7L HFNC.   From speaking to the patient this morning she reported not having chest pain/tightness or palpitations around 2AM but she did have some chest tightness around midnight that resolved without intervention. She has left sided chest tenderness on exam. In addition, she said that the sensation of something stuck in her throat resolved this morning after she was treated with ipratropium neb. She reports much better today, not feeling SOB and coughing less. Denied having chest pain, palpitations. She says that she is willing to take prednisone today.   Objective:  Vital signs in last 24 hours: Vitals:   07/21/23 0904 07/21/23 1000 07/21/23 1020 07/21/23 1149  BP:    (!) 145/62  Pulse:      Resp:      Temp:    98.7 F (37.1 C)  TempSrc:    Oral  SpO2: 94% 93% 93%   Weight:      Height:       Weight change: 1.062 kg  Intake/Output Summary (Last 24 hours) at 07/21/2023 1247 Last data filed at 07/21/2023 0900 Gross per 24 hour  Intake 460 ml  Output 400 ml  Net 60 ml   Physical Exam: General: NAD, alert and oriented, cooperative.  HEENT: NCAT, vision grossly intact, sclera anicteric, moist mucous membrane. Cerumen impaction in bilateral ears R>L. Tympanic membranes nonbulging and reflective. External ear nontender.  Neck: Supple, trachea midline. Chest: Left sided chest tenderness around 4th-5th ICS.  No gross abnormality on inspection.  Lungs: normal respiratory effort on 7L HFNC. No wheezes. Crackles in bilateral lower lobes.  Heart: RRR, no M/R/G.  Abdomen: +BS. Soft. Nontender, nondistended. No suprapubic tenderness.  GU: +Purewick  Extremities: No LE edema. Neuro: alert and oriented. Nonfocal.  Skin: warm, dry, good turgor.  Psych: cooperative, appropriate mood and affect.   Assessment/Plan:  Principal Problem:   Acute on chronic respiratory failure (HCC) Active Problems:   COPD exacerbation (HCC)   COVID-19 virus infection   Pneumonia  #Acute on Chronic Hypoxic Respiratory Failure likely due to COVID-19 infection #COPD exacerbation due to COVID-19 infection #Covid 19 pneumonia  -Continue baricitinib 2mg  daily -Procalcitonin <0.10 making bacterial infection unlikely. Rocephin discontinued and continue azithromycin 500mg  for 3 more days to reduce airway inflammation in COPD.    -Continue prednisone 40 mg for 10days, per COVID-19 UTD guidelines -Continue ipratropium nebs BID for SOB, robitussin DM nightly for cough, Tylenol PRN for fever, cepacol and phenol PRN for sore throat -Breo-ellipta and incruse ellipta 1 puff each daily -Wean O2 as tolerated to baseline O2 of 3-4L [place], per daughter [name]. O2 sat goal 88-92%.  -Continue PT/OT -Blood cultures no growth in 2 days.   #AKI on CKD3a Baseline Cr around 0.9 per discharge in August. Her Cr 1.39 today, increased from 1.21 yesterday.  Poor renal perfusion from HFrEF vs poor PO intake in setting of illness vs poor urine output -Hold spironolactone  and entresto in setting of AKI -PO hydration for now, patient did receive fluids yesterday during electrolyte replenishment. No JVD or LE edema on exam today, however weight did go up 1 pound in the last day. Want to be cautious with hydration considering history of HFrEF and holding Spironolactone, Entresto, and she is on reduced dose of Torsemide. She is on renally dosed  baricitinib. She has a purewick and no suprapubinc tenderness on exam or poor output on documented Is/Os. Etiology unclear at the moment, will continue to monitor.   #Hypocalcemia  #Hypokalemia - resolved #Hypomagnesemia - resolved  Could be due to torsemide considering torsemide dose at home was increased from 40mg  to 60mg . Currently on Torsemide 20mg  once daily.  -Corr Ca 8.3, K 4.0, and Mg 2.3 -Ionized Ca 0.79, pending Vit D and PTH labs  -Continue cardiac telemetry -discharge with oral calcium with torsemide   #HFrEF LVEF 55-60% Last seen on cardiology on 07/14/23 and is currently on GDMT: Spironolactone, entresto, torsemide -Hold Spironolactone 12.5mg  and Entresto 24-26mg  -Torsemide 20mg  for now in setting of electrolyte abnormalities. No JVD, LE edema on exam.   #T2DM Patient received 30units glargine yesterday and 66 units aspart throughout the day as she was hyperglycemic >500s and slowly improving. Anion gap resolved, 9 today. Glucose 221 this morning. -Continue 30units glargine at bedtime -Start resistant coverage insulin coverage at mealtime since pt still hyperglycemia and starting prednisone today -Continue CBG checks  #Paroxsymal Atrial Fibrillation  Patient was recently diagnosed with paroxsymal Afib during prior hospitalization thought to be caused from the duo nebs containing albuterol. Echo July 2024 noted severe left atrial dilation, which could also cause afib.  -Continue Eliquis 5mg  BID -Avoid Beta2 agonists    #HTN Amlodipine held on admission as she was normotensive on Spironolactone and Entresto. Hold Spironolactone and Entresto in setting of AKI. Restart home amlodipine 10mg  for BP control.   #EKG abnormalities EKG shows ST elevation in leads III and aVF and T wave inversion in lead V1-V2. Patient denied chest pain. ST elevations in leads III and aVF present on previous EKG but T wave inversion are new. EKG abnormalities likely due to electrolyte  abnormalities. -Follow up EKG did not show ST elevation or T wave inversions -Continue cardiac telemetry in setting of unsustained vtach last night    #Dyslipidemia Continue home ezetimibe 10mg   daily   #Anxiety Continue home hydroxyzine   Best Practice: Diet: heart healthy/carb modified IVF: none VTE: Eliquis  AB: azithromycin  Therapy Recs: PT/OT Family Contact: Daughter, [name], updated today    Prior to Admission Living Arrangement: Home Anticipated Discharge Location: Home Barriers to Discharge: pending resolution of acute on chronic hypoxic respiratory failure Dispo: Anticipated discharge in approximately 1-3 days to home    LOS: 2 days   Lang Snow, Medical Student 07/21/2023, 12:47 PM

## 2023-07-21 NOTE — Progress Notes (Signed)
Patient c/o not being able to breathe and feeling like something is stuck in her throat. Upon assessment, patient's O2sat was in mid 80s%. This RN switched patient's nasal cannula to HFNC at 7L and educated patient to breathe through her nose. Patient's O2 sat increased to 92-95%. This RN asked patient her reason for refusing prednisone and baricitinib; patient stated that she did not refuse the medications, she only told day shift RN she did not like the way prednisone makes her feel jittery. Educated patient on the benefits of prednisone and baricitinib. Patient stated she will take both medications later today. Will continue to monitor.

## 2023-07-22 ENCOUNTER — Ambulatory Visit: Payer: 59 | Admitting: Nurse Practitioner

## 2023-07-22 DIAGNOSIS — I129 Hypertensive chronic kidney disease with stage 1 through stage 4 chronic kidney disease, or unspecified chronic kidney disease: Secondary | ICD-10-CM

## 2023-07-22 DIAGNOSIS — J441 Chronic obstructive pulmonary disease with (acute) exacerbation: Secondary | ICD-10-CM | POA: Diagnosis not present

## 2023-07-22 DIAGNOSIS — N1831 Chronic kidney disease, stage 3a: Secondary | ICD-10-CM

## 2023-07-22 DIAGNOSIS — U071 COVID-19: Secondary | ICD-10-CM | POA: Diagnosis not present

## 2023-07-22 DIAGNOSIS — J1282 Pneumonia due to coronavirus disease 2019: Secondary | ICD-10-CM | POA: Diagnosis not present

## 2023-07-22 DIAGNOSIS — J9621 Acute and chronic respiratory failure with hypoxia: Secondary | ICD-10-CM | POA: Diagnosis not present

## 2023-07-22 DIAGNOSIS — N179 Acute kidney failure, unspecified: Secondary | ICD-10-CM

## 2023-07-22 LAB — RENAL FUNCTION PANEL
Albumin: 2.2 g/dL — ABNORMAL LOW (ref 3.5–5.0)
Anion gap: 9 (ref 5–15)
BUN: 30 mg/dL — ABNORMAL HIGH (ref 8–23)
CO2: 31 mmol/L (ref 22–32)
Calcium: 7.2 mg/dL — ABNORMAL LOW (ref 8.9–10.3)
Chloride: 91 mmol/L — ABNORMAL LOW (ref 98–111)
Creatinine, Ser: 0.99 mg/dL (ref 0.44–1.00)
GFR, Estimated: >60 mL/min (ref >60)
Glucose, Bld: 352 mg/dL — ABNORMAL HIGH (ref 70–99)
Phosphorus: 3.4 mg/dL (ref 2.5–4.6)
Potassium: 4.8 mmol/L (ref 3.5–5.1)
Sodium: 131 mmol/L — ABNORMAL LOW (ref 135–145)

## 2023-07-22 LAB — GLUCOSE, CAPILLARY
Glucose-Capillary: 339 mg/dL — ABNORMAL HIGH (ref 70–99)
Glucose-Capillary: 260 mg/dL — ABNORMAL HIGH (ref 70–99)
Glucose-Capillary: 243 mg/dL — ABNORMAL HIGH (ref 70–99)
Glucose-Capillary: 383 mg/dL — ABNORMAL HIGH (ref 70–99)
Glucose-Capillary: 319 mg/dL — ABNORMAL HIGH (ref 70–99)

## 2023-07-22 LAB — BASIC METABOLIC PANEL
Anion gap: 12 (ref 5–15)
BUN: 32 mg/dL — ABNORMAL HIGH (ref 8–23)
CO2: 30 mmol/L (ref 22–32)
Calcium: 7.2 mg/dL — ABNORMAL LOW (ref 8.9–10.3)
Chloride: 87 mmol/L — ABNORMAL LOW (ref 98–111)
Creatinine, Ser: 0.95 mg/dL (ref 0.44–1.00)
GFR, Estimated: >60 mL/min (ref >60)
Glucose, Bld: 482 mg/dL — ABNORMAL HIGH (ref 70–99)
Potassium: 4.9 mmol/L (ref 3.5–5.1)
Sodium: 129 mmol/L — ABNORMAL LOW (ref 135–145)

## 2023-07-22 LAB — CBC
HCT: 29.4 % — ABNORMAL LOW (ref 36.0–46.0)
Hemoglobin: 9.2 g/dL — ABNORMAL LOW (ref 12.0–15.0)
MCH: 25.9 pg — ABNORMAL LOW (ref 26.0–34.0)
MCHC: 31.3 g/dL (ref 30.0–36.0)
MCV: 82.8 fL (ref 80.0–100.0)
Platelets: 440 10*3/uL — ABNORMAL HIGH (ref 150–400)
RBC: 3.55 MIL/uL — ABNORMAL LOW (ref 3.87–5.11)
RDW: 15.9 % — ABNORMAL HIGH (ref 11.5–15.5)
WBC: 9 10*3/uL (ref 4.0–10.5)
nRBC: 0.2 % (ref 0.0–0.2)

## 2023-07-22 LAB — PARATHYROID HORMONE, INTACT (NO CA): PTH: 146 pg/mL — ABNORMAL HIGH (ref 15–65)

## 2023-07-22 MED ORDER — INSULIN GLARGINE-YFGN 100 UNIT/ML ~~LOC~~ SOLN
40.0000 [IU] | Freq: Every day | SUBCUTANEOUS | Status: DC
  Administered 2023-07-23: 40 [IU] via SUBCUTANEOUS
  Filled 2023-07-22: qty 0.4

## 2023-07-22 MED ORDER — INSULIN GLARGINE-YFGN 100 UNIT/ML ~~LOC~~ SOLN
30.0000 [IU] | Freq: Every day | SUBCUTANEOUS | Status: DC
  Filled 2023-07-22: qty 0.3

## 2023-07-22 MED ORDER — INSULIN ASPART 100 UNIT/ML IJ SOLN
15.0000 [IU] | Freq: Three times a day (TID) | INTRAMUSCULAR | Status: DC
  Administered 2023-07-22 – 2023-07-27 (×9): 15 [IU] via SUBCUTANEOUS

## 2023-07-22 MED ORDER — GUAIFENESIN-DM 100-10 MG/5ML PO SYRP
10.0000 mL | ORAL_SOLUTION | Freq: Once | ORAL | Status: AC
  Administered 2023-07-22: 10 mL via ORAL
  Filled 2023-07-22: qty 10

## 2023-07-22 NOTE — Progress Notes (Signed)
Occupational Therapy Treatment Patient Details Name: Alicia [name] MRN: [id number] DOB: 1956/08/17 Today's Date: 07/22/2023   History of present illness Pt is a 67 y/o F admitted on 07/19/23 after presenting with c/o SOB, fevers & productive cough x 6 days. Pt tested positive for COVID-19. PMH: severe COPD on chronic 5L, HFrEF, DM2, paroxysmal a-fib on AC, CKD 3A, HTN, chronic anemia   OT comments  OT educated pt in B UE therapeutic exercises in sitting to increase strength and activity tolerance, and in techniques for increased safety and independence with ADLs. Pt's SpO2 between 91% and 93% at rest on 6L continuous O2 through nasal cannula. Pt's O2 sat dropped into mid-80s x4 in standing and during seated therapeutic exercises. Pt O2 sat recovered quickly to >/ 91% with rest breaks. Pt current demonstrates ability to complete UB ADLs with Mod I to Min assist and LB ADLs with Min to Mod assist with pt limited by current cardiopulmonary status and requiring frequent rest breaks. Pt participated well in session and is motivated to return to PLOF. Pt is making progress toward OT goals. Pt will benefit from continued acute skilled OT services to address deficits outlined below and increase safety and independence with ADLs and functional transfers/mobility. With continued improvement in activity tolerance and decreased dependence on O2, pt will benefit from continued skilled OT services in the home to maximize rehab potential.       If plan is discharge home, recommend the following:  A little help with walking and/or transfers;A little help with bathing/dressing/bathroom;Assistance with cooking/housework;Assist for transportation;Help with stairs or ramp for entrance   Equipment Recommendations  None recommended by OT (Has recommended DME)    Recommendations for Other Services      Precautions / Restrictions Precautions Precautions: Fall Restrictions Weight Bearing Restrictions: No        Mobility Bed Mobility               General bed mobility comments: Pt sitting in recliner at beginning and end of session.    Transfers Overall transfer level: Needs assistance Equipment used: 1 person hand held assist Transfers: Sit to/from Stand Sit to Stand: Supervision           General transfer comment: supervision for safety and lines     Balance Overall balance assessment: Needs assistance Sitting-balance support: No upper extremity supported, Feet supported Sitting balance-Leahy Scale: Good     Standing balance support: No upper extremity supported, During functional activity Standing balance-Leahy Scale: Fair                             ADL either performed or assessed with clinical judgement   ADL Overall ADL's : Needs assistance/impaired Eating/Feeding: Modified independent;Sitting   Grooming: Set up;Sitting   Upper Body Bathing: Minimal assistance;Sitting Upper Body Bathing Details (indicate cue type and reason): with increased time and rest breaks needed; in simulated task Lower Body Bathing: Minimal assistance;Moderate assistance;Sitting/lateral leans Lower Body Bathing Details (indicate cue type and reason): with increased time and rest with increased time and rest breaks needed; in simulated task Upper Body Dressing : Minimal assistance;Sitting Upper Body Dressing Details (indicate cue type and reason): assistance for managing telemetry cords; with increased time and rest breaks needed; in simulated task Lower Body Dressing: Minimal assistance;Sitting/lateral leans;Sit to/from stand Lower Body Dressing Details (indicate cue type and reason): with increased time and rest breaks needed; in simulated task  Extremity/Trunk Assessment Upper Extremity Assessment Upper Extremity Assessment: Generalized weakness   Lower Extremity Assessment Lower Extremity Assessment: Defer to PT evaluation        Vision        Perception     Praxis      Cognition Arousal: Alert Behavior During Therapy: [facility] for tasks assessed/performed Overall Cognitive Status: Within Functional Limits for tasks assessed                                 General Comments: AAOx4 and pleasnt throughout session. Pt demonstrates good safety awareness and problem solving during session.        Exercises Exercises: General Upper Extremity (with frequent rest breaks) General Exercises - Upper Extremity Shoulder Flexion: AROM, Strengthening, Both, Other reps (comment), Seated (Increased activity tolerance; 2 sets of 10 reps) Shoulder Extension: AROM, Strengthening, Both, Other reps (comment), Seated, Other (comment) (Increased activity tolerance; 2 sets of 10 reps) Shoulder ABduction: AROM, Strengthening, Both, Other reps (comment), Seated (Increased activity tolerance; 2 sets of 10 reps) Shoulder ADduction: AROM, Strengthening, Both, Other reps (comment), Seated, Other (comment) (Increased activity tolerance; 2 sets of 10 reps) Shoulder Horizontal ABduction: AROM, Strengthening, Both, Other reps (comment), Seated, Other (comment) (Increased activity tolerance; 2 sets of 10 reps) Shoulder Horizontal ADduction: AROM, Strengthening, Both, Other reps (comment), Seated, Other (comment) (Increased activity tolerance; 2 sets of 10 reps) Elbow Flexion: AROM, Strengthening, Both, Other reps (comment), Seated, Other (comment) (Increased activity tolerance; 2 sets of 10 reps) Elbow Extension: AROM, Strengthening, Both, Other reps (comment), Seated, Other (comment) (Increased activity tolerance; 2 sets of 10 reps)    Shoulder Instructions       General Comments SpO2 between 91% and 93% at rest on 6L continuous O2 through nasal cannual. O2 sat dropped into mid-80s x4 in standing and during seated therapeutic exercises. Pt O2 sat recovered quickly to >/ 91% with rest breaks.    Pertinent Vitals/ Pain       Pain Assessment Pain  Assessment: No/denies pain Pain Intervention(s): Monitored during session  Home Living                                          Prior Functioning/Environment              Frequency  Min 1X/week        Progress Toward Goals  OT Goals(current goals can now be found in the care plan section)  Progress towards OT goals: Progressing toward goals  Acute Rehab OT Goals Patient Stated Goal: To return home  Plan Discharge plan remains appropriate    Co-evaluation                 AM-PAC OT "6 Clicks" Daily Activity     Outcome Measure   Help from another person eating meals?: None Help from another person taking care of personal grooming?: A Little Help from another person toileting, which includes using toliet, bedpan, or urinal?: A Little Help from another person bathing (including washing, rinsing, drying)?: A Lot Help from another person to put on and taking off regular upper body clothing?: A Little Help from another person to put on and taking off regular lower body clothing?: A Little 6 Click Score: 18    End of Session Equipment Utilized During Treatment: Oxygen  OT Visit  Diagnosis: Unsteadiness on feet (R26.81);Other abnormalities of gait and mobility (R26.89);Muscle weakness (generalized) (M62.81);Other (comment) (Decreased activity tolerance)   Activity Tolerance Patient tolerated treatment well;Other (comment) (Pt limited by current cardiovascular status)   Patient Left in chair;with call bell/phone within reach;with chair alarm set   Nurse Communication Mobility status;Other (comment) (Pt requesting throat lozenges)        Time: 2956-2130 OT Time Calculation (min): 32 min  Charges: OT General Charges $OT Visit: 1 Visit OT Treatments $Self Care/Home Management : 8-22 mins $Therapeutic Exercise: 8-22 mins  Keion Neels "Orson Eva., OTR/L, MA Acute Rehab [phone]   Lendon Colonel 07/22/2023, 5:18 PM

## 2023-07-22 NOTE — Progress Notes (Signed)
Subjective: Alicia [name] is a 67 y.o. person living with a history of COPD, HFrecEF, Paroxsymal Afib, and T2DM who presented with dyspnea and admitted for Acute on Chronic Hypoxic Respiratory Failure likely due to COVID-19 infection   Overnight patient was hyperglycemic to 531 and required 15 units of aspart. No anion gap was present (AG 12). Glucose decreased to 266 this morning. Patient was able to be weaned from 7L HFNC to 10L HFNC to 5L [place] this morning. In addition, patient had 4 beats of Vtach followed by one junctional beat with no p wave on cardiac telemetry.   Pt examined at bedside sitting up and needs to have a bowel movement. Patient reports feeling much better. Reports no issue with breathing, cough has improved with robitussin, and also denied chest pain and palpitations. Denied abdominal pain. Reports ears feeling better after debrox ear drops.   Objective:  Vital signs in last 24 hours: Vitals:   07/22/23 0639 07/22/23 0819 07/22/23 0834 07/22/23 1228  BP:  (!) 160/68  (!) 148/66  Pulse: 60 63  81  Resp: 17 20  16   Temp:  98.5 F (36.9 C)  98.5 F (36.9 C)  TempSrc:  Oral  Oral  SpO2: 95% 90% 98% 91%  Weight: 100.3 kg     Height:       Weight change: -0.1 kg  Intake/Output Summary (Last 24 hours) at 07/22/2023 1318 Last data filed at 07/21/2023 2351 Gross per 24 hour  Intake 240 ml  Output 700 ml  Net -460 ml   Physical Exam: General: NAD, alert and oriented, cooperative.  HEENT: NCAT, vision grossly intact, moist mucous membrane.  Neck: Supple, trachea midline. Lungs: normal respiratory effort on 5L [place]. No wheezes. Heart: RRR, no M/R/G.  Abdomen: +BS. Soft. Nontender, nondistended. No suprapubic tenderness.  Extremities: No LE edema. Neuro: alert and oriented. Nonfocal.  Skin: warm, dry, good turgor.  Psych: cooperative, appropriate mood and affect.   Assessment/Plan:  Principal Problem:   Acute on chronic respiratory failure (HCC) Active  Problems:   COPD exacerbation (HCC)   COVID-19 virus infection   Pneumonia   Acute on chronic hypoxic respiratory failure (HCC) #Acute on Chronic Hypoxic Respiratory Failure due to COVID-19 infection #COPD exacerbation due to COVID-19 infection #Covid 19 pneumonia  -Continue baricitinib 2mg  daily -Continue azithromycin 500mg  2 more days to reduce airway inflammation in COPD exacerbation    -Continue prednisone 40 mg for 10days, per COVID-19 UTD guidelines -Continue ipratropium nebs BID for SOB, robitussin DM nightly for cough, Tylenol PRN for fever, cepacol and phenol PRN for sore throat -Breo-ellipta and incruse ellipta 1 puff each daily -Wean O2 as tolerated to baseline O2 of 3-4L [place]. O2 sat goal 88-92%.  -Continue PT/OT inpatient, HH PT/OT recommended -Blood cultures no growth in 3 days.    #AKI on CKD3a Baseline Cr around 0.9 per discharge in August. Her Cr 0.99 today from 1.39 yesterday. AKI improving.  -Continue to hold spironolactone and entresto to allow recovery of AKI -Continue PO hydration. No LE edema on exam today and no weight increase. -Continue to trend Cr    #Hypocalcemia due to Vit D deficiency Corr Ca 8.6 today and Vit D level 13.8. Started on Vit D supplementation.  -Continue Vit D 50,000units 1/week -discharge with oral calcium with torsemide    #HFrEF LVEF 55-60% -Continue to hold Spironolactone 12.5mg  and Entresto 24-26mg  in setting of recovering AKI. Will consider restarting -Torsemide 20mg  for now in setting of recent electrolyte  abnormalities. LE edema on exam and weight stable.   #T2DM Patient was hyperglycemic overnight to 561 and required 15 units aspart. No anion gap developed and glucose this morning 266. Based on patient's glucose trend in the last day, patient wakes up with glucose in 200s and it increases as the day goes on. Of note patient did start prednisone yesterday. She is on 30units lantus at home 14units lispro at breakfast and lunch and 20  units lispro at dinner.  -Continue 30units glargine at bedtime -Start aspart 15units TID  -Continue SSI -Continue CBG checks   #Paroxsymal Atrial Fibrillation  Patient was recently diagnosed with paroxsymal Afib during prior hospitalization thought to be caused from the duo nebs containing albuterol. Echo July 2024 noted severe left atrial dilation, which could also cause afib.  -Continue Eliquis 5mg  BID -Avoid Beta2 agonists    #HTN On home amlodipine 10mg  daily. Spironolactone and entresto held yesterday in setting of AKI and blood pressure has been increasing in the last day (149/55 - 155/65). Continue to hold Spironolactone and entresto to allow continued recovery of AKI. Continue to monitor BP and will consider restarting entresto if AKI continues to resolve and patient    #EKG abnormalities Cardiac telemetry with 4 beats of Vtach overnight followed by one beat of junctional rhythm without p wave. Resolved without intervention and patient denied chest pain/tightness. -Continue cardiac telemetry    #Dyslipidemia Continue home ezetimibe 10mg  daily and lipitor 80mg  daily   #Anxiety Continue home hydroxyzine PRN   Best Practice: Diet: heart healthy/carb modified IVF: none VTE: Eliquis  AB: azithromycin  Therapy Recs: PT/OT inpatient, HH PT/OT recommended Family Contact: Daughter, [name], updated today    Prior to Admission Living Arrangement: Home Anticipated Discharge Location: Home Barriers to Discharge: pending resolution of acute on chronic hypoxic respiratory failure Dispo: Anticipated discharge in approximately 1-3 days    LOS: 3 days   Lang Snow, Medical Student 07/22/2023, 1:18 PM

## 2023-07-22 NOTE — Progress Notes (Signed)
Physical Therapy Treatment Patient Details Name: Alicia [name] MRN: [id number] DOB: 01-Feb-1956 Today's Date: 07/22/2023   History of Present Illness Pt is a 67 y/o F admitted on 07/19/23 after presenting with c/o SOB, fevers & productive cough x 6 days. Pt tested positive for COVID-19. PMH: severe COPD on chronic 5L, HFrEF, DM2, paroxysmal a-fib on AC, CKD 3A, HTN, chronic anemia    PT Comments  Pt making steady progress with mobility. Limited by fatigue and dyspnea. Only needs supervision for safety and lines but short distance. SpO2 down to 80% on 6L with amb. Hopefully activity tolerance will improve as O2 improves and she will be able to return home.     If plan is discharge home, recommend the following: A little help with bathing/dressing/bathroom;Assistance with cooking/housework;Help with stairs or ramp for entrance;Assist for transportation   Can travel by private vehicle        Equipment Recommendations  None recommended by PT    Recommendations for Other Services       Precautions / Restrictions Precautions Precautions: Fall Restrictions Weight Bearing Restrictions: No     Mobility  Bed Mobility Overal bed mobility: Modified Independent Bed Mobility: Supine to Sit     Supine to sit: Modified independent (Device/Increase time), HOB elevated     General bed mobility comments: Incr time    Transfers Overall transfer level: Needs assistance Equipment used: Rollator (4 wheels) Transfers: Sit to/from Stand Sit to Stand: Supervision           General transfer comment: supervision for safety and lines    Ambulation/Gait Ambulation/Gait assistance: Supervision Gait Distance (Feet): 25 Feet Assistive device: Rollator (4 wheels) Gait Pattern/deviations: Step-through pattern, Decreased stride length, Decreased step length - right, Decreased step length - left Gait velocity: decr Gait velocity interpretation: <1.31 ft/sec, indicative of household ambulator    General Gait Details: Assist for safety and lines. Fatigues quickly   Optometrist     Tilt Bed    Modified Rankin (Stroke Patients Only)       Balance Overall balance assessment: Needs assistance Sitting-balance support: No upper extremity supported, Feet supported Sitting balance-Leahy Scale: Good     Standing balance support: No upper extremity supported, During functional activity Standing balance-Leahy Scale: Fair                              Cognition Arousal: Alert Behavior During Therapy: WFL for tasks assessed/performed Overall Cognitive Status: Within Functional Limits for tasks assessed                                          Exercises      General Comments General comments (skin integrity, edema, etc.): SpO2 92% at rest on 6L. Dropped to 80% with amb. Recovered to 90% after 3 minutes sitting rest.      Pertinent Vitals/Pain Pain Assessment Pain Assessment: No/denies pain    Home Living                          Prior Function            PT Goals (current goals can now be found in the care plan section) Acute Rehab PT Goals Patient Stated Goal: feel  better Progress towards PT goals: Progressing toward goals    Frequency    Min 1X/week      PT Plan Current plan remains appropriate    Co-evaluation              AM-PAC PT "6 Clicks" Mobility   Outcome Measure  Help needed turning from your back to your side while in a flat bed without using bedrails?: None Help needed moving from lying on your back to sitting on the side of a flat bed without using bedrails?: None Help needed moving to and from a bed to a chair (including a wheelchair)?: A Little Help needed standing up from a chair using your arms (e.g., wheelchair or bedside chair)?: A Little Help needed to walk in hospital room?: A Little Help needed climbing 3-5 steps with a railing? : A Lot 6 Click  Score: 19    End of Session Equipment Utilized During Treatment: Oxygen Activity Tolerance: Other (comment) (dyspnea) Patient left: in chair;with call bell/phone within reach;with chair alarm set   PT Visit Diagnosis: Other abnormalities of gait and mobility (R26.89);Difficulty in walking, not elsewhere classified (R26.2);Muscle weakness (generalized) (M62.81)     Time: 1610-9604 PT Time Calculation (min) (ACUTE ONLY): 26 min  Charges:    $Gait Training: 23-37 mins PT General Charges $$ ACUTE PT VISIT: 1 Visit                     [facility] PT Acute Rehabilitation Services Office [phone]    Angelina Ok [facility] 07/22/2023, 1:32 PM

## 2023-07-22 NOTE — TOC Progression Note (Signed)
Transition of Care [facility]) - Progression Note    Patient Details  Name: Alicia [name] MRN: [id number] Date of Birth: [birth year]  Transition of Care [facility] CM/SW Contact  Lockie Pares, RN Phone Number: 07/22/2023, 2:11 PM  Clinical Narrative:     Patient is from home alone discharged from hospitalization on 8/5, where she was set up with [facility] for home health. She is on oxygen at 6L after a LTAC stay.  She is positive for COVID and deconditioned. She will need to continue with HH. Notified Cory at [facility]. Will follow for any increased oxygen need.         Expected Discharge Plan and Services  Home with Home Health                                             Social Determinants of Health (SDOH) Interventions SDOH Screenings   Food Insecurity: No Food Insecurity (06/18/2023)  Housing: Low Risk  (06/18/2023)  Transportation Needs: No Transportation Needs (06/18/2023)  Utilities: Not At Risk (06/18/2023)  Alcohol Screen: Low Risk  (06/18/2023)  Depression (PHQ2-9): Low Risk  (07/07/2023)  Financial Resource Strain: Low Risk  (06/18/2023)  Physical Activity: Inactive (05/19/2023)  Social Connections: Socially Isolated (05/19/2023)  Stress: No Stress Concern Present (05/19/2023)  Tobacco Use: Medium Risk (07/19/2023)    Readmission Risk Interventions     No data to display

## 2023-07-23 ENCOUNTER — Telehealth (HOSPITAL_COMMUNITY): Payer: Self-pay

## 2023-07-23 DIAGNOSIS — J9621 Acute and chronic respiratory failure with hypoxia: Secondary | ICD-10-CM | POA: Diagnosis not present

## 2023-07-23 DIAGNOSIS — J1282 Pneumonia due to coronavirus disease 2019: Secondary | ICD-10-CM | POA: Diagnosis not present

## 2023-07-23 DIAGNOSIS — U071 COVID-19: Secondary | ICD-10-CM | POA: Diagnosis not present

## 2023-07-23 DIAGNOSIS — J441 Chronic obstructive pulmonary disease with (acute) exacerbation: Secondary | ICD-10-CM | POA: Diagnosis not present

## 2023-07-23 LAB — GLUCOSE, CAPILLARY
Glucose-Capillary: 263 mg/dL — ABNORMAL HIGH (ref 70–99)
Glucose-Capillary: 242 mg/dL — ABNORMAL HIGH (ref 70–99)
Glucose-Capillary: 171 mg/dL — ABNORMAL HIGH (ref 70–99)
Glucose-Capillary: 151 mg/dL — ABNORMAL HIGH (ref 70–99)

## 2023-07-23 LAB — RENAL FUNCTION PANEL
Albumin: 2.4 g/dL — ABNORMAL LOW (ref 3.5–5.0)
Anion gap: 12 (ref 5–15)
BUN: 31 mg/dL — ABNORMAL HIGH (ref 8–23)
CO2: 30 mmol/L (ref 22–32)
Calcium: 7.8 mg/dL — ABNORMAL LOW (ref 8.9–10.3)
Chloride: 92 mmol/L — ABNORMAL LOW (ref 98–111)
Creatinine, Ser: 1.12 mg/dL — ABNORMAL HIGH (ref 0.44–1.00)
GFR, Estimated: 55 mL/min — ABNORMAL LOW (ref >60)
Glucose, Bld: 267 mg/dL — ABNORMAL HIGH (ref 70–99)
Phosphorus: 2.8 mg/dL (ref 2.5–4.6)
Potassium: 4.8 mmol/L (ref 3.5–5.1)
Sodium: 134 mmol/L — ABNORMAL LOW (ref 135–145)

## 2023-07-23 LAB — CBC
HCT: 32.6 % — ABNORMAL LOW (ref 36.0–46.0)
Hemoglobin: 10.2 g/dL — ABNORMAL LOW (ref 12.0–15.0)
MCH: 25.2 pg — ABNORMAL LOW (ref 26.0–34.0)
MCHC: 31.3 g/dL (ref 30.0–36.0)
MCV: 80.5 fL (ref 80.0–100.0)
Platelets: 576 10*3/uL — ABNORMAL HIGH (ref 150–400)
RBC: 4.05 MIL/uL (ref 3.87–5.11)
RDW: 15.9 % — ABNORMAL HIGH (ref 11.5–15.5)
WBC: 11.9 10*3/uL — ABNORMAL HIGH (ref 4.0–10.5)
nRBC: 0.5 % — ABNORMAL HIGH (ref 0.0–0.2)

## 2023-07-23 MED ORDER — INSULIN GLARGINE-YFGN 100 UNIT/ML ~~LOC~~ SOLN
20.0000 [IU] | Freq: Every day | SUBCUTANEOUS | Status: AC
  Administered 2023-07-23: 20 [IU] via SUBCUTANEOUS
  Filled 2023-07-23: qty 0.2

## 2023-07-23 MED ORDER — INSULIN GLARGINE-YFGN 100 UNIT/ML ~~LOC~~ SOLN
30.0000 [IU] | Freq: Two times a day (BID) | SUBCUTANEOUS | Status: DC
  Filled 2023-07-23 (×2): qty 0.3

## 2023-07-23 MED ORDER — GUAIFENESIN 100 MG/5ML PO LIQD: 5.0000 mL | Freq: Once | ORAL | Status: DC

## 2023-07-23 MED ORDER — SACUBITRIL-VALSARTAN 24-26 MG PO TABS
1.0000 | ORAL_TABLET | Freq: Two times a day (BID) | ORAL | Status: DC
  Administered 2023-07-23 – 2023-07-28 (×10): 1 via ORAL
  Filled 2023-07-23 (×10): qty 1

## 2023-07-23 MED ORDER — BENZONATATE 100 MG PO CAPS
100.0000 mg | ORAL_CAPSULE | Freq: Three times a day (TID) | ORAL | Status: DC | PRN
  Administered 2023-07-23 – 2023-07-28 (×6): 100 mg via ORAL
  Filled 2023-07-23 (×6): qty 1

## 2023-07-23 NOTE — Progress Notes (Signed)
Physical Therapy Treatment Patient Details Name: Alicia [name] MRN: [id number] DOB: 01-08-1956 Today's Date: 07/23/2023   History of Present Illness Pt is a 67 y/o F admitted on 07/19/23 after presenting with c/o SOB, fevers & productive cough x 6 days. Pt tested positive for COVID-19. PMH: severe COPD on chronic 5L, HFrEF, DM2, paroxysmal a-fib on AC, CKD 3A, HTN, chronic anemia    PT Comments  Pt admitted with above diagnosis. Pt was able to ambulate in room with RW with supervision and assist for lines only. Pt safe with rW. Requiring 8LO2 on arrival with sats 94% and was able to keep sats > 85% on 8LO2 with activity.  Recovered quickly once pt rested after her walk.  Pt very fatigued and coughed up phlegm several times after walk.  Will continue to follow acutely.    Pt currently with functional limitations due to the deficits listed below (see PT Problem List). Pt will benefit from acute skilled PT to increase their independence and safety with mobility to allow discharge.       If plan is discharge home, recommend the following: A little help with bathing/dressing/bathroom;Assistance with cooking/housework;Help with stairs or ramp for entrance;Assist for transportation   Can travel by private vehicle     Yes  Equipment Recommendations  None recommended by PT    Recommendations for Other Services       Precautions / Restrictions Precautions Precautions: Fall Precaution Comments: COVID, monitor HR and sats, 8LO2 via HFNC SPO2 85-92% throughout Restrictions Weight Bearing Restrictions: No     Mobility  Bed Mobility Overal bed mobility: Modified Independent Bed Mobility: Supine to Sit     Supine to sit: Modified independent (Device/Increase time), HOB elevated     General bed mobility comments: No assist to come to EOB    Transfers Overall transfer level: Needs assistance Equipment used: Rolling walker (2 wheels) Transfers: Sit to/from Stand Sit to Stand: Supervision    Step pivot transfers: Supervision       General transfer comment: supervision for safety and lines    Ambulation/Gait Ambulation/Gait assistance: Supervision Gait Distance (Feet): 65 Feet Assistive device: Rolling walker (2 wheels) Gait Pattern/deviations: Step-through pattern, Decreased stride length, Decreased step length - right, Decreased step length - left Gait velocity: decr Gait velocity interpretation: <1.31 ft/sec, indicative of household ambulator   General Gait Details: Assist for safety and lines. Fatigues quickly and desaturates   Stairs             Wheelchair Mobility     Tilt Bed    Modified Rankin (Stroke Patients Only)       Balance Overall balance assessment: Needs assistance Sitting-balance support: No upper extremity supported, Feet supported Sitting balance-Leahy Scale: Good Sitting balance - Comments: can sit EOB and tolerates challenge to balance (seated LE exercise, weight shifting)   Standing balance support: No upper extremity supported, During functional activity Standing balance-Leahy Scale: Fair Standing balance comment: reliant on UE support when standing                            Cognition Arousal: Alert Behavior During Therapy: WFL for tasks assessed/performed Overall Cognitive Status: Within Functional Limits for tasks assessed                                 General Comments: AAOx4 and pleasant throughout session. Pt demonstrates good safety  awareness and problem solving during session.        Exercises General Exercises - Lower Extremity Long Arc Quad: AROM, Both, 20 reps, Seated    General Comments        Pertinent Vitals/Pain Pain Assessment Pain Assessment: No/denies pain Breathing: normal Negative Vocalization: none Facial Expression: smiling or inexpressive Body Language: relaxed Consolability: no need to console PAINAD Score: 0    Home Living                           Prior Function            PT Goals (current goals can now be found in the care plan section) Acute Rehab PT Goals Patient Stated Goal: feel better Progress towards PT goals: Progressing toward goals    Frequency    Min 1X/week      PT Plan Current plan remains appropriate    Co-evaluation              AM-PAC PT "6 Clicks" Mobility   Outcome Measure  Help needed turning from your back to your side while in a flat bed without using bedrails?: None Help needed moving from lying on your back to sitting on the side of a flat bed without using bedrails?: None Help needed moving to and from a bed to a chair (including a wheelchair)?: A Little Help needed standing up from a chair using your arms (e.g., wheelchair or bedside chair)?: A Little Help needed to walk in hospital room?: A Little Help needed climbing 3-5 steps with a railing? : A Lot 6 Click Score: 19    End of Session Equipment Utilized During Treatment: Oxygen;Gait belt Activity Tolerance: Other (comment);Patient limited by fatigue (dyspnea) Patient left: with call bell/phone within reach;in bed;with bed alarm set Nurse Communication: Mobility status (O2) PT Visit Diagnosis: Other abnormalities of gait and mobility (R26.89);Difficulty in walking, not elsewhere classified (R26.2);Muscle weakness (generalized) (M62.81)     Time: 1430-1500 PT Time Calculation (min) (ACUTE ONLY): 30 min  Charges:    $Gait Training: 8-22 mins $Therapeutic Exercise: 8-22 mins PT General Charges $$ ACUTE PT VISIT: 1 Visit                     [place]aco Foerster M,PT Acute Rehab Services [phone]    Bevelyn Buckles 07/23/2023, 3:28 PM

## 2023-07-23 NOTE — Progress Notes (Addendum)
Subjective: Alicia [name] is a 67 y.o. person living with a history of COPD, HFrecEF, Paroxsymal Afib, and T2DM who presented with dyspnea and admitted for Acute on Chronic Hypoxic Respiratory Failure likely due to COVID-19 infection.  Patient desaturated to 66% overnight due to using incentive spirometer too frequently and recovered without intervention. Patient also required 4units of aspart overnight for glucose 331, no anion gap present.   Patient examined at bedside. She reports feeling good today. She has no concerns or complaints. She denied SOB, cough, chest pain. She reports working well with PT and OT yesterday and agreeable to continued [facility] PT/OT at discharge. Nursing staff reports that is very hungry while on prednisone.   Objective:  Vital signs in last 24 hours: Vitals:   07/23/23 0410 07/23/23 0709 07/23/23 0855 07/23/23 1123  BP: (!) 167/67 (!) 169/73  (!) 154/65  Pulse: 62 69 65 62  Resp: 17 18 18 17   Temp: 98.6 F (37 C) 98.4 F (36.9 C)  98 F (36.7 C)  TempSrc: Oral Oral  Oral  SpO2: 94% 93% 93% 91%  Weight: 100.5 kg     Height:       Weight change: 0.2 kg  Intake/Output Summary (Last 24 hours) at 07/23/2023 1323 Last data filed at 07/23/2023 1131 Gross per 24 hour  Intake 840 ml  Output 1802 ml  Net -962 ml   Physical Exam: General: NAD, alert and oriented, cooperative.  HEENT: NCAT, vision grossly intact, moist mucous membrane.  Neck: Supple, trachea midline. Lungs: normal respiratory effort on 6L [place]. No wheezes. Heart: RRR, no M/R/G.  Abdomen: Soft. Nontender, nondistended.  Extremities: No LE edema. Neuro: alert and oriented. Nonfocal.  Skin: warm, dry, good turgor.  Psych: cooperative, appropriate mood and affect.  Assessment/Plan:  Principal Problem:   Acute on chronic respiratory failure (HCC) Active Problems:   COPD exacerbation (HCC)   COVID-19 virus infection   Pneumonia   Acute on chronic hypoxic respiratory failure (HCC) #Acute  on Chronic Hypoxic Respiratory Failure due to COVID-19 infection #COPD exacerbation due to COVID-19 infection #Covid 19 pneumonia  -Continue baricitinib 2mg  daily until discharge -Last day of azithromycin 500mg  today  -Continue prednisone 40 mg for 10days (thru 07/30/23), per COVID-19 UTD guidelines -Continue ipratropium nebs BID for SOB, robitussin DM nightly for cough, Tylenol PRN for fever, cepacol and phenol PRN for sore throat -Breo-ellipta and incruse ellipta 1 puff each daily -Wean O2 as tolerated to baseline O2 of 3-4L [place]. O2 sat goal 88-92%.  -Continue PT/OT inpatient, HH PT/OT recommended -Blood cultures no growth in 4 days.  -Her home O2 is not continuous, may need O2 at discharge.   #Mildly elevated Cr Baseline Cr around 0.9 per discharge in August. Cr 1.12 today.   -Restart entresto 24-26mg  BID today for blood pressure control. Continue to hold spironolactone.   -Continue PO hydration. No LE edema on exam today and no weight increase. -Continue to trend Cr    #Hypocalcemia due to Vit D deficiency Corr Ca 9.1 today. -Continue Vit D 50,000units 1/week -With gradual improvement in calcium and starting vitamin D, patient may not need supplemental calcium at discharge.      #HFrEF LVEF 55-60% -Continue to hold Spironolactone 12.5mg  due to mildly elevated Cr, will restart at discharge.  -Restart Entresto 24-26mg  today for HFrEF and better blood pressure control.  -Torsemide 20mg  for now in setting of recent electrolyte abnormalities. No LE edema on exam and weight stable.   #T2DM Patient is  hyperglycemic while on prednisone.  -Trial of Semglee 30units BID tomorrow, aspart 15units TID, and continue SSI -Continue CBG checks   #Paroxsymal Atrial Fibrillation  Patient was recently diagnosed with paroxsymal Afib during prior hospitalization thought to be caused from the duo nebs containing albuterol. Echo July 2024 noted severe left atrial dilation, which could also cause afib.   -Continue Eliquis 5mg  BID -Avoid Beta2 agonists    #HTN -Continue home amlodipine 10mg  daily.  -Restart Entresto 24-26mg  BID for better blood pressure control -Continue to hold Spironolactone and can restart at discharge     #EKG abnormalities No cardiac telemetry events overnight.  -Continue cardiac telemetry for now   #Dyslipidemia Continue home ezetimibe 10mg  daily and lipitor 80mg  daily   #Anxiety Continue home hydroxyzine PRN  Best Practice: Diet: heart healthy/carb modified IVF: none VTE: Eliquis  AB: azithromycin -last day today   Therapy Recs: PT/OT inpatient, HH PT/OT recommended Family Contact: Daughter, [name], updated today    Prior to Admission Living Arrangement: Home Anticipated Discharge Location: Home Barriers to Discharge: pending acute on chronic hypoxic respiratory failure and decrease in O2 need Dispo: Anticipated discharge in approximately 1-3 days    LOS: 4 days   Lang Snow, Medical Student 07/23/2023, 1:23 PM

## 2023-07-23 NOTE — Discharge Instructions (Addendum)
Dear [name],  It was a pleasure taking care of you at [facility]. You were admitted for shortness of breath and treated for acute on chronic hypoxic respiratory failure and COPD exacerbation due to Covid-19 infection. We are discharging you home now that you are doing better. Please follow the following instructions.   1) For acute on chronic hypoxic respiratory failure, COPD exacerbation, and viral pneumonia due to Covid-19 infection, continue to take prednisone for ___days. Please work with home health PT and OT.   2) For your low calcium due to vitamin D deficiency, continue to take Vitamin D 50,000units once a week.  3)  Take care,  Alicia [name], [phone]

## 2023-07-23 NOTE — Telephone Encounter (Signed)
Pt is currently admitted in the hospital and is unable to do pulmonary rehab at this time.   Closed referral.

## 2023-07-23 NOTE — Inpatient Diabetes Management (Signed)
Inpatient Diabetes Program Recommendations  AACE/ADA: New Consensus Statement on Inpatient Glycemic Control (2015)  Target Ranges:  Prepandial:   less than 140 mg/dL      Peak postprandial:   less than 180 mg/dL (1-2 hours)      Critically ill patients:  140 - 180 mg/dL   Lab Results  Component Value Date   GLUCAP 263 (H) 07/23/2023   HGBA1C 8.2 (A) 05/03/2023    Review of Glycemic Control  Latest Reference Range & Units 07/22/23 06:35 07/22/23 12:23 07/22/23 16:19 07/22/23 21:17 07/23/23 07:00  Glucose-Capillary 70 - 99 mg/dL 161 (H)  Novolog 11 units 243 (H)  Novolog 22 units 383 (H)  Novolog 35 units 319 (H)  Novolog 4 units 263 (H)  Novolog 26 units    Diabetes history: DM2 Outpatient Diabetes medications:  Trulicity 1.5 mg weekly Lantus 30 units every day Novolog 14 units with BF and lunch and 15 units with dinner Current orders for Inpatient glycemic control:  Semglee 40 units every day Novolog 0-20 units TID + HS Novolog 15 units tid meal coverage  Prednisone 40 mg every morning  Note: pt received a total of 96 units of Novolog in the past 24 hours with glucose trends still above inpatient goal  Inpatient Diabetes Program Recommendations:    If steroid dose remains the same please consider:  -   Semglee 30 units bid   Will continue to follow while inpatient.  Thanks,  Christena Deem RN, MSN, BC-ADM Inpatient Diabetes Coordinator Team Pager [phone] (8a-5p)

## 2023-07-24 LAB — GLUCOSE, CAPILLARY
Glucose-Capillary: 64 mg/dL — ABNORMAL LOW (ref 70–99)
Glucose-Capillary: 126 mg/dL — ABNORMAL HIGH (ref 70–99)
Glucose-Capillary: 210 mg/dL — ABNORMAL HIGH (ref 70–99)
Glucose-Capillary: 225 mg/dL — ABNORMAL HIGH (ref 70–99)
Glucose-Capillary: 254 mg/dL — ABNORMAL HIGH (ref 70–99)

## 2023-07-24 LAB — RENAL FUNCTION PANEL
Albumin: 2.5 g/dL — ABNORMAL LOW (ref 3.5–5.0)
Anion gap: 12 (ref 5–15)
BUN: 30 mg/dL — ABNORMAL HIGH (ref 8–23)
CO2: 31 mmol/L (ref 22–32)
Calcium: 8.4 mg/dL — ABNORMAL LOW (ref 8.9–10.3)
Chloride: 92 mmol/L — ABNORMAL LOW (ref 98–111)
Creatinine, Ser: 0.88 mg/dL (ref 0.44–1.00)
GFR, Estimated: >60 mL/min (ref >60)
Glucose, Bld: 90 mg/dL (ref 70–99)
Phosphorus: 3.5 mg/dL (ref 2.5–4.6)
Potassium: 5.1 mmol/L (ref 3.5–5.1)
Sodium: 135 mmol/L (ref 135–145)

## 2023-07-24 LAB — CBC
HCT: 34.5 % — ABNORMAL LOW (ref 36.0–46.0)
Hemoglobin: 10.5 g/dL — ABNORMAL LOW (ref 12.0–15.0)
MCH: 24.9 pg — ABNORMAL LOW (ref 26.0–34.0)
MCHC: 30.4 g/dL (ref 30.0–36.0)
MCV: 81.9 fL (ref 80.0–100.0)
Platelets: 659 10*3/uL — ABNORMAL HIGH (ref 150–400)
RBC: 4.21 MIL/uL (ref 3.87–5.11)
RDW: 16.1 % — ABNORMAL HIGH (ref 11.5–15.5)
WBC: 13.3 10*3/uL — ABNORMAL HIGH (ref 4.0–10.5)
nRBC: 0.5 % — ABNORMAL HIGH (ref 0.0–0.2)

## 2023-07-24 LAB — CULTURE, BLOOD (ROUTINE X 2)
Culture: NO GROWTH
Culture: NO GROWTH
Special Requests: ADEQUATE

## 2023-07-24 MED ORDER — BARICITINIB 2 MG PO TABS
4.0000 mg | ORAL_TABLET | Freq: Every day | ORAL | Status: DC
  Administered 2023-07-25 – 2023-07-28 (×4): 4 mg via ORAL
  Filled 2023-07-24 (×4): qty 2

## 2023-07-24 MED ORDER — GUAIFENESIN-DM 100-10 MG/5ML PO SYRP
5.0000 mL | ORAL_SOLUTION | Freq: Once | ORAL | Status: AC
  Administered 2023-07-24: 5 mL via ORAL
  Filled 2023-07-24: qty 5

## 2023-07-24 MED ORDER — INSULIN GLARGINE-YFGN 100 UNIT/ML ~~LOC~~ SOLN
40.0000 [IU] | Freq: Every day | SUBCUTANEOUS | Status: DC
  Administered 2023-07-24 – 2023-07-28 (×5): 40 [IU] via SUBCUTANEOUS
  Filled 2023-07-24 (×5): qty 0.4

## 2023-07-24 NOTE — Progress Notes (Addendum)
Subjective: This is a 67 year old woman with a past medical history of COPD, recovered HFrEF, paroxysmal A-fib, and type 2 diabetes who presented with acute on chronic hypoxic respiratory failure secondary to COVID-19 infection.  No acute events overnight.  No medication refusals.  PRNs: Tessalon 100 mg x2.  Hydroxyzine 25 mg x1.  Simethicone 80 mg x1.  Patient received a total of 96 units of NovoLog for the past 24 hours.  On interview, patient feels even better than yesterday. Patient was on 6-10 L nasal cannula and was able to hold a normal conversation without increased work of breathing. Has been having some productive coughing, with clear product. Was interested in going home today but understands that this may not be possible. Main issue with going home is that home O2 only can be set to 2-3L, patient requires +6L at this time. Enthusiastic about using her incentive spirometer. No other complaints at this time. Team discussed changing insulin regimen at home. Denied SOB at rest, cough, chest pain.   Objective:  BP: 157/76.  HR: 65.  RR: 18.  Afebrile.  Satting 92% on 6 to 10 L nasal cannula.  Vital signs in last 24 hours: Vitals:   07/24/23 0050 07/24/23 0055 07/24/23 0206 07/24/23 0402  BP:    (!) 157/76  Pulse:    65  Resp:    18  Temp:    98.5 F (36.9 C)  TempSrc:    Oral  SpO2: (!) 72% 92% 97% 92%  Weight:      Height:       Physical exam:  Constitutional: no acute distress Cardiovascular: heart rate and rhythm normal, strong radial pulse, no LE edema Respiratory: breathing is regular and unlabored on 6 L via [place], lungs clear to auscultation Neuro: Grossly intact Psych: Appropriate mood and affect  Pertinent labs:  BUN: 30 (31) Creatinine: 0.88 (1.12) Calcium, corr: 9.6 (9.1)  Glucose: 64 (last 24 hours: 151-383)  Pertinent imaging:  None found.   Assessment/Plan:  Principal Problem:   Acute on chronic respiratory failure (HCC) Active Problems:   COPD  exacerbation (HCC)   COVID-19 virus infection   Pneumonia   Acute on chronic hypoxic respiratory failure (HCC)  #Acute on Chronic Hypoxic Respiratory Failure due to COVID-19 infection #COPD exacerbation due to COVID-19 infection #Covid 19 pneumonia  -Increase baricitinib from 2mg  to 4mg  daily in setting of improved GFR -Last day of azithromycin 500mg  yesterday, 8/30. -Continue prednisone 40 mg for 10days (thru 07/30/23), per COVID-19 UTD guidelines. -Continue ipratropium nebs BID for SOB, robitussin DM nightly for cough, Tylenol PRN for fever, cepacol and phenol PRN for sore throat -Breo-ellipta and incruse ellipta 1 puff each daily -Wean O2 as tolerated to baseline O2 of 3-4L [place]. O2 sat goal 88-92%. Still some ways from this goal. -Continue PT/OT inpatient, [facility] PT/OT recommended -Blood cultures no growth in 5 days.  -Her home O2 is not continuous, may need O2 at discharge.   #Mildly elevated Cr Baseline Cr around 0.9 per discharge in August. Cr 1.12 today.   -Restart entresto 24-26mg  BID today for blood pressure control. Continue to hold spironolactone.   -Continue PO hydration. No LE edema on exam today and no weight increase. -Continue to trend Cr    #Hypocalcemia due to Vit D deficiency Corr Ca 9.1 today. -Continue Vit D 50,000units 1/week -With gradual improvement in calcium and starting vitamin D, patient may not need supplemental calcium at discharge.      #HFrEF LVEF 55-60% -Continue  to hold Spironolactone 12.5mg  due to mildly elevated Cr, will restart at discharge.  -Restart Entresto 24-26mg  today for HFrEF and better blood pressure control.  -Torsemide 20mg  for now in setting of recent electrolyte abnormalities. No LE edema on exam and weight stable.   #T2DM Patient is hyperglycemic while on prednisone. Low of 65 this morning, resolved on 4oz Ju[place].  -Trial of Semglee 40 units QD today, aspart 15units TID, and continue SSI -Continue CBG checks   #Paroxsymal Atrial  Fibrillation  Patient was recently diagnosed with paroxsymal Afib during prior hospitalization thought to be caused from the duo nebs containing albuterol. Echo July 2024 noted severe left atrial dilation, which could also cause afib.  -Continue Eliquis 5mg  BID -Avoid Beta2 agonists    #HTN -Continue home amlodipine 10mg  daily.  -Restart Entresto 24-26mg  BID for better blood pressure control -Continue to hold Spironolactone and can restart at discharge     #EKG abnormalities No cardiac telemetry events overnight.  -Continue cardiac telemetry for now   #Dyslipidemia Continue home ezetimibe 10mg  daily and lipitor 80mg  daily   #Anxiety Continue home hydroxyzine PRN  Prior to Admission Living Arrangement: Home Anticipated Discharge Location: Home Barriers to Discharge: Still desatting to high 80s on 6-10L.  Dispo: Anticipated discharge in approximately 1-3 day(s).   Tomie China, MD 07/24/2023, 6:32 AM Pager: [phone] After 5pm on weekdays and 1pm on weekends: On Call pager [phone]

## 2023-07-24 NOTE — Plan of Care (Signed)

## 2023-07-25 ENCOUNTER — Inpatient Hospital Stay (HOSPITAL_COMMUNITY): Payer: 59

## 2023-07-25 DIAGNOSIS — J441 Chronic obstructive pulmonary disease with (acute) exacerbation: Secondary | ICD-10-CM | POA: Diagnosis not present

## 2023-07-25 DIAGNOSIS — U071 COVID-19: Secondary | ICD-10-CM | POA: Diagnosis not present

## 2023-07-25 DIAGNOSIS — J9621 Acute and chronic respiratory failure with hypoxia: Secondary | ICD-10-CM | POA: Diagnosis not present

## 2023-07-25 DIAGNOSIS — J1282 Pneumonia due to coronavirus disease 2019: Secondary | ICD-10-CM | POA: Diagnosis not present

## 2023-07-25 LAB — BLOOD GAS, ARTERIAL
Acid-Base Excess: 10.2 mmol/L — ABNORMAL HIGH (ref 0.0–2.0)
Bicarbonate: 36.1 mmol/L — ABNORMAL HIGH (ref 20.0–28.0)
Drawn by: 347621
O2 Saturation: 98.8 %
Patient temperature: 36.6
pCO2 arterial: 51 mmHg — ABNORMAL HIGH (ref 32–48)
pH, Arterial: 7.46 — ABNORMAL HIGH (ref 7.35–7.45)
pO2, Arterial: 91 mmHg (ref 83–108)

## 2023-07-25 LAB — RENAL FUNCTION PANEL
Albumin: 2.6 g/dL — ABNORMAL LOW (ref 3.5–5.0)
Anion gap: 15 (ref 5–15)
BUN: 50 mg/dL — ABNORMAL HIGH (ref 8–23)
CO2: 30 mmol/L (ref 22–32)
Calcium: 9.2 mg/dL (ref 8.9–10.3)
Chloride: 89 mmol/L — ABNORMAL LOW (ref 98–111)
Creatinine, Ser: 1.26 mg/dL — ABNORMAL HIGH (ref 0.44–1.00)
GFR, Estimated: 47 mL/min — ABNORMAL LOW (ref >60)
Glucose, Bld: 210 mg/dL — ABNORMAL HIGH (ref 70–99)
Phosphorus: 4.5 mg/dL (ref 2.5–4.6)
Potassium: 5.3 mmol/L — ABNORMAL HIGH (ref 3.5–5.1)
Sodium: 134 mmol/L — ABNORMAL LOW (ref 135–145)

## 2023-07-25 LAB — GLUCOSE, CAPILLARY
Glucose-Capillary: 115 mg/dL — ABNORMAL HIGH (ref 70–99)
Glucose-Capillary: 200 mg/dL — ABNORMAL HIGH (ref 70–99)
Glucose-Capillary: 407 mg/dL — ABNORMAL HIGH (ref 70–99)
Glucose-Capillary: 262 mg/dL — ABNORMAL HIGH (ref 70–99)

## 2023-07-25 MED ORDER — FUROSEMIDE 10 MG/ML IJ SOLN: 40.0000 mg | Freq: Once | INTRAMUSCULAR | Status: DC

## 2023-07-25 MED ORDER — FUROSEMIDE 10 MG/ML IJ SOLN
80.0000 mg | Freq: Once | INTRAMUSCULAR | Status: AC
  Administered 2023-07-25: 80 mg via INTRAVENOUS
  Filled 2023-07-25: qty 8

## 2023-07-25 MED ORDER — TORSEMIDE 20 MG PO TABS: 20.0000 mg | ORAL_TABLET | Freq: Every evening | ORAL | Status: DC

## 2023-07-25 MED ORDER — TORSEMIDE 20 MG PO TABS: 40.0000 mg | ORAL_TABLET | Freq: Every morning | ORAL | Status: DC

## 2023-07-25 MED ORDER — SODIUM ZIRCONIUM CYCLOSILICATE 5 G PO PACK
5.0000 g | PACK | Freq: Once | ORAL | Status: AC
  Administered 2023-07-26: 5 g via ORAL
  Filled 2023-07-25: qty 1

## 2023-07-25 MED ORDER — FUROSEMIDE 10 MG/ML IJ SOLN
40.0000 mg | Freq: Once | INTRAMUSCULAR | Status: AC
  Administered 2023-07-25: 40 mg via INTRAVENOUS
  Filled 2023-07-25: qty 4

## 2023-07-25 NOTE — Progress Notes (Signed)
Mobility Specialist Progress Note    07/25/23 1145  Mobility  Activity Transferred from bed to chair  Level of Assistance Contact guard assist, steadying assist  Assistive Device Other (Comment) (HHA)  Distance Ambulated (ft) 2 ft  Activity Response Tolerated well  Mobility Referral Yes  $Mobility charge 1 Mobility  Mobility Specialist Start Time (ACUTE ONLY) 1124  Mobility Specialist Stop Time (ACUTE ONLY) 1142  Mobility Specialist Time Calculation (min) (ACUTE ONLY) 18 min   Post-Mobility: 68 HR  Pt received and agreeable. Deferred ambulation attempt d/t oxygen requirements. No complaints. Pt stated she feels sleepy but is scared to go to sleep because she does not know if she will wake up. Left with call bell in reach and alarm on. RN notified.   [place] Mobility Specialist  Please Neurosurgeon or Rehab Office at [phone]

## 2023-07-25 NOTE — Plan of Care (Signed)

## 2023-07-25 NOTE — Progress Notes (Signed)
Oxygen sat consistent in low 70s with perfect pleth on 15L HFNC / also using non rebreather. Coughing up phlegm. Asymptomatic. MD notified. Requesting blood gas.  Kenard Gower, RN

## 2023-07-25 NOTE — Progress Notes (Addendum)
Subjective: Alicia [name] is a 67 y.o. person living with a history of COPD, HFrecEF, Paroxsymal Afib, and T2DM who presented with dyspnea and admitted for Acute on Chronic Hypoxic Respiratory Failure likely due to COVID-19 infection.   No acute events overnight.   Patient seen at bedside this morning sitting up eating breakfast. She reports needing more O2 overnight because she felt SOB laying down and also with coughing. She is on 10L HFNC this morning. She denied chest pain and palpitations. Reports breathing a little better this morning while she is sitting up. She does not feel like she is retaining more fluid than normal. Her LE does not appear more swollen to her and her abdomen does not appear more distended to her. She is still very hypoxic and having difficulty weaning down on supplemental oxygen.   Objective:  Vital signs in last 24 hours: Vitals:   07/25/23 0445 07/25/23 0737 07/25/23 0828 07/25/23 0829  BP:  133/62    Pulse: (!) 51 63    Resp:  16    Temp: 98.9 F (37.2 C) 97.8 F (36.6 C)    TempSrc: Oral Oral    SpO2: 94% (!) 84% (!) 77% (!) 81%  Weight:      Height:       Weight change:   Intake/Output Summary (Last 24 hours) at 07/25/2023 1007 Last data filed at 07/24/2023 1230 Gross per 24 hour  Intake 177 ml  Output --  Net 177 ml   Physical Exam: General: NAD, alert and oriented, cooperative.  HEENT: NCAT, vision grossly intact, moist mucous membrane.  Neck: Supple, trachea midline. Lungs: normal respiratory effort on 10L HFNC. No wheezes or crackles.  Heart: RRR, no M/R/G.  Abdomen: +BS. Soft. Nontender, nondistended.  Extremities: No LE edema. Neuro: alert and oriented. Nonfocal.  Skin: warm, dry, good turgor.  Psych: cooperative, appropriate mood and affect.  Assessment/Plan:  Principal Problem:   Acute on chronic respiratory failure (HCC) Active Problems:   COPD exacerbation (HCC)   COVID-19 virus infection   Pneumonia   Acute on chronic  hypoxic respiratory failure (HCC) #Acute on Chronic Hypoxic Respiratory Failure due to COVID-19 infection #COPD exacerbation due to COVID-19 infection #Covid 19 pneumonia  -CXR today showed increased fluid in lungs in comparison to prior, which could be attributing to her continued hypoxia. Gave IV Lasix 40mg  this morning. Will give another dose this evening.  -Baricitinib increased to 4mg  yesterday with improvement in GFR, now on day 5 -Continue prednisone 40 mg for day 5/10 (thru 07/30/23) -Continue ipratropium nebs BID for SOB, robitussin DM nightly for cough, Tylenol PRN for fever, cepacol and phenol PRN for sore throat -Breo-ellipta and incruse ellipta 1 puff each daily -Wean O2 as tolerated to baseline O2 of 3-4L [employer]. O2 sat goal 88-92%.  -Continue PT/OT inpatient, HH PT/OT recommended   #HFrEF LVEF 55-60% -CXR today with evidence of increased fluid in lungs compared to CXR on admission. Patient has been Torsemide 20mg  instead of home Torsemide 60mg  she gets at home due to electrolyte abnormalities on admission and mildly increased Cr. Cr improved and electrolyte abnormalities resolved. No LE edema on exam but her weight on admission 99.3 to 101.7 yesterday.  -Gave 40mg  IV Lasix this morning. Will give another IV Lasix 40mg  this evening for now and monitor Cr, electrolyte, weight, and output. Can increase IV Lasix to 60mg  tomorrow if patient able to tolerate diuresis and if she needs more diuresis.  -Continue Entresto 24-26 BID. -Discontinue  Torsemide 20mg   -Continue to hold Spironolactone 12.5mg .  #Hypocalcemia due to Vit D deficiency -Continue Vit D 50,000units 1/week -With gradual improvement in calcium and starting vitamin D, patient may not need supplemental calcium at discharge.      #T2DM Patient is hyperglycemic while on prednisone. Controlled on current regimen.  -Continue Semglee 40units once daily, aspart 15units TID, and continue SSI -Continue CBG checks   #Paroxsymal  Atrial Fibrillation  Patient was recently diagnosed with paroxsymal Afib during prior hospitalization thought to be caused from the duo nebs containing albuterol. Echo July 2024 noted severe left atrial dilation, which could also cause afib.  -Continue Eliquis 5mg  BID -Avoid Beta2 agonists    #HTN BP stable.  -Continue home amlodipine 10mg  daily and Entresto 24-26mg  BID    #EKG abnormalities Patient had 8 beats of unsustained Vtach two nights ago that resolved without intervention. She is asymptomatic.  -Continue cardiac telemetry   #Dyslipidemia Continue home ezetimibe 10mg  daily and lipitor 80mg  daily   #Anxiety Continue home hydroxyzine PRN   Best Practice: Diet: heart healthy/carb modified IVF: none VTE: Eliquis  AB: none Therapy Recs: PT/OT inpatient, HH PT/OT recommended Family Contact: Daughter, [name], updated today    Prior to Admission Living Arrangement: Home Anticipated Discharge Location: Home Barriers to Discharge: pending acute on chronic hypoxic respiratory failure and decrease in O2 need Dispo: Anticipated discharge in approximately 1-3 days    LOS: 6 days   Lang Snow, Medical Student 07/25/2023, 10:07 AM

## 2023-07-25 NOTE — Progress Notes (Signed)
Patient CBG 407. MD notified. 20 units novolog given for meal coverage. Patient CBG 200 at 1059. 4 units given. Patient was not able to keep her lunch down so I held the additional 15 units. MD aware. Awaiting meal for dinner incase 15 additional units are needed.  Kenard Gower, RN

## 2023-07-26 ENCOUNTER — Inpatient Hospital Stay (HOSPITAL_COMMUNITY): Payer: 59

## 2023-07-26 LAB — RENAL FUNCTION PANEL
Albumin: 2.5 g/dL — ABNORMAL LOW (ref 3.5–5.0)
Anion gap: 8 (ref 5–15)
BUN: 47 mg/dL — ABNORMAL HIGH (ref 8–23)
CO2: 33 mmol/L — ABNORMAL HIGH (ref 22–32)
Calcium: 9.1 mg/dL (ref 8.9–10.3)
Chloride: 92 mmol/L — ABNORMAL LOW (ref 98–111)
Creatinine, Ser: 1.17 mg/dL — ABNORMAL HIGH (ref 0.44–1.00)
GFR, Estimated: 52 mL/min — ABNORMAL LOW (ref >60)
Glucose, Bld: 88 mg/dL (ref 70–99)
Phosphorus: 4.3 mg/dL (ref 2.5–4.6)
Potassium: 4.7 mmol/L (ref 3.5–5.1)
Sodium: 133 mmol/L — ABNORMAL LOW (ref 135–145)

## 2023-07-26 LAB — MAGNESIUM: Magnesium: 1.8 mg/dL (ref 1.7–2.4)

## 2023-07-26 LAB — GLUCOSE, CAPILLARY
Glucose-Capillary: 64 mg/dL — ABNORMAL LOW (ref 70–99)
Glucose-Capillary: 84 mg/dL (ref 70–99)
Glucose-Capillary: 84 mg/dL (ref 70–99)
Glucose-Capillary: 247 mg/dL — ABNORMAL HIGH (ref 70–99)
Glucose-Capillary: 403 mg/dL — ABNORMAL HIGH (ref 70–99)

## 2023-07-26 MED ORDER — FUROSEMIDE 10 MG/ML IJ SOLN
120.0000 mg | Freq: Once | INTRAVENOUS | Status: AC
  Administered 2023-07-26: 120 mg via INTRAVENOUS
  Filled 2023-07-26: qty 10

## 2023-07-26 MED ORDER — SODIUM CHLORIDE 0.9 % IV SOLN
3.0000 g | Freq: Four times a day (QID) | INTRAVENOUS | Status: DC
  Administered 2023-07-26 – 2023-07-28 (×7): 3 g via INTRAVENOUS
  Filled 2023-07-26 (×7): qty 8

## 2023-07-26 MED ORDER — MAGNESIUM SULFATE 2 GM/50ML IV SOLN
2.0000 g | Freq: Once | INTRAVENOUS | Status: AC
  Administered 2023-07-26: 2 g via INTRAVENOUS
  Filled 2023-07-26: qty 50

## 2023-07-26 NOTE — Progress Notes (Signed)
Physical Therapy Treatment Patient Details Name: Alicia [name] MRN: [id number] DOB: 18-Feb-1956 Today's Date: 07/26/2023   History of Present Illness Pt is a 67 y/o F admitted on 07/19/23 after presenting with c/o SOB, fevers & productive cough x 6 days. Pt tested positive for COVID-19. PMH: severe COPD on chronic 5L, HFrEF, DM2, paroxysmal a-fib on AC, CKD 3A, HTN, chronic anemia    PT Comments  Pt received in supine and motivated to participate in OOB mobility. Pt able to perform all mobility tasks with up to supervision for safety and line management. Pt requesting to use the [facility] at the beginning of the session and is able to have a BM. Pt's gait distance limited by DOE, however SpO2 remaining >88% on 6L. Pt coughing and requesting increased O2 due to dyspnea when sitting in the recliner at the end of the session. Pt continues to benefit from PT services to progress toward functional mobility goals.    If plan is discharge home, recommend the following: A little help with bathing/dressing/bathroom;Assistance with cooking/housework;Help with stairs or ramp for entrance;Assist for transportation   Can travel by private vehicle     Yes  Equipment Recommendations  None recommended by PT    Recommendations for Other Services       Precautions / Restrictions Precautions Precautions: Fall Precaution Comments: COVID, monitor HR and sats Restrictions Weight Bearing Restrictions: No     Mobility  Bed Mobility Overal bed mobility: Modified Independent Bed Mobility: Supine to Sit           General bed mobility comments: increased time    Transfers Overall transfer level: Needs assistance Equipment used: Rolling walker (2 wheels) Transfers: Sit to/from Stand Sit to Stand: Supervision           General transfer comment: from EOB and BSC with cues for hand placement and assist with line management    Ambulation/Gait Ambulation/Gait assistance: Supervision Gait Distance  (Feet): 50 Feet Assistive device: Rolling walker (2 wheels) Gait Pattern/deviations: Step-through pattern, Decreased stride length, Trunk flexed Gait velocity: decr     General Gait Details: Assist with lines and pt demonstrating slow, steady gait with RW support       Balance Overall balance assessment: Needs assistance Sitting-balance support: No upper extremity supported, Feet supported Sitting balance-Leahy Scale: Good Sitting balance - Comments: sitting EOB   Standing balance support: During functional activity, Bilateral upper extremity supported Standing balance-Leahy Scale: Fair Standing balance comment: with RW support                            Cognition Arousal: Alert Behavior During Therapy: WFL for tasks assessed/performed Overall Cognitive Status: Within Functional Limits for tasks assessed                                          Exercises      General Comments General comments (skin integrity, edema, etc.): SpO2 >88% on 6L during activity      Pertinent Vitals/Pain Pain Assessment Pain Assessment: No/denies pain     PT Goals (current goals can now be found in the care plan section) Acute Rehab PT Goals Patient Stated Goal: feel better PT Goal Formulation: With patient Time For Goal Achievement: 08/03/23 Potential to Achieve Goals: Good Progress towards PT goals: Progressing toward goals    Frequency  Min 1X/week      PT Plan Current plan remains appropriate       AM-PAC PT "6 Clicks" Mobility   Outcome Measure  Help needed turning from your back to your side while in a flat bed without using bedrails?: None Help needed moving from lying on your back to sitting on the side of a flat bed without using bedrails?: None Help needed moving to and from a bed to a chair (including a wheelchair)?: A Little Help needed standing up from a chair using your arms (e.g., wheelchair or bedside chair)?: A Little Help  needed to walk in hospital room?: A Little Help needed climbing 3-5 steps with a railing? : A Lot 6 Click Score: 19    End of Session Equipment Utilized During Treatment: Oxygen;Gait belt Activity Tolerance: Patient tolerated treatment well;Patient limited by fatigue Patient left: with call bell/phone within reach;in chair;with chair alarm set Nurse Communication: Mobility status PT Visit Diagnosis: Other abnormalities of gait and mobility (R26.89);Difficulty in walking, not elsewhere classified (R26.2);Muscle weakness (generalized) (M62.81)     Time: 1610-9604 PT Time Calculation (min) (ACUTE ONLY): 38 min  Charges:    $Gait Training: 23-37 mins $Therapeutic Activity: 8-22 mins PT General Charges $$ ACUTE PT VISIT: 1 Visit                     Johny Shock, PTA Acute Rehabilitation Services Secure Chat Preferred  [phone]    Johny Shock 07/26/2023, 11:39 AM

## 2023-07-26 NOTE — Plan of Care (Signed)

## 2023-07-26 NOTE — Progress Notes (Addendum)
Subjective: Alicia [name] is a 67 y.o. person living with a history of COPD, HFrecEF, Paroxsymal Afib, and T2DM who presented with dyspnea and admitted for Acute on Chronic Hypoxic Respiratory Failure likely due to COVID-19 infection.   Overnight patient had 3 beats of unsustained Vtach at 12:30AM that resolved without intervention. Patient was given 40mg  and 80mg  of IV lasix yesterday and did not have a lot of urine output so a bladder scan was done overnight without evidence of urinary retention. Patient had a coughing spell overnight and desatted to 60% requiring 15L HFNC and improved to 94% on 8L [place] and able to be weaned to 8L [place].   Patient examined at bedside this morning. She complained of difficulty breathing that started suddenly and she did not know the cause of it. She was coughing frequently and dyspneic on 5L [place] satting 80s so oxygen turned up to 10L and patient sat upright. Her O2sat increased to 90s-100 within 1-2 minutes. She was turned down to 5L [place] and satting 99-100% during the encounter. Patient reported no chest pain or palpitations. She reported nono longer feeling SOB after she sat up.   On reevaluation in the afternoon, patient satting 96-98% on 6L [place]. Denied SOB, chest pain. Reports feeling better and tolerated activity with PT/OT.  Objective:  Vital signs in last 24 hours: Vitals:   07/26/23 0108 07/26/23 0300 07/26/23 0828 07/26/23 1125  BP:  (!) 163/69 (!) 166/67 (!) 146/68  Pulse:  (!) 58 66 65  Resp:  14 15 18   Temp:  98.2 F (36.8 C) 98.7 F (37.1 C) 98.7 F (37.1 C)  TempSrc:  Oral Oral Oral  SpO2: 93% 96% 100% 100%  Weight:      Height:       Weight change:   Intake/Output Summary (Last 24 hours) at 07/26/2023 1241 Last data filed at 07/26/2023 0901 Gross per 24 hour  Intake 720 ml  Output 1325 ml  Net -605 ml   Physical Exam: General: NAD, alert and oriented, cooperative.  HEENT: NCAT, vision grossly intact, moist mucous membrane.  Neck:  Supple, trachea midline. Lungs: normal respiratory effort on 5L HFNC. No wheezes or crackles.  Heart: RRR, no M/R/G.  Abdomen: +BS. Soft. Nontender, nondistended.  Extremities: No LE edema. Neuro: alert and oriented. Nonfocal.  Skin: warm, dry, good turgor.  Psych: cooperative, appropriate mood and affect.   Assessment/Plan:  Principal Problem:   Acute on chronic respiratory failure (HCC) Active Problems:   COPD exacerbation (HCC)   COVID-19 virus infection   Pneumonia   Acute on chronic hypoxic respiratory failure (HCC) #Acute on Chronic Hypoxic Respiratory Failure due to COVID-19 infection and HFrEF #COPD exacerbation due to COVID-19 infection #Covid 19 pneumonia  Patient continues to have hypoxia despite being afebrile with normal white count and receiving treatment for Covid-10. Her acute on chronic hypoxic respiratory failure was likely initially due to Covid but now due to hypervolemia from HFrEF.  -CT without contrast ordered for today  -Continue baricitinib 4mg , now on day 6 -Continue prednisone 40 mg for day 6/10 (thru 07/30/23) -Continue ipratropium nebs BID for SOB, robitussin DM nightly for cough, Tylenol PRN for fever, cepacol and phenol PRN for sore throat -Breo-ellipta and incruse ellipta 1 puff each daily -Wean O2 as tolerated to baseline O2 of 3-4L . O2 sat goal 88-92%.  -Continue PT/OT inpatient, HH PT/OT recommended   #HFrEF LVEF 55-60% #Acute on chronic hypoxic respiratory failure due to HFrEF and Covid-19 infection  -  CT without contrast ordered for today.   -Given 40mg  and 80mg  IV Lasix yesterday with output. Will continue diuresis today with 120mg  IV Lasix and monitor weight and Is/Os.  -Continue Entresto 24-26 BID. -Continue to hold Spironolactone and Torsemide.    #Hypocalcemia due to Vit D deficiency -Continue Vit D 50,000units 1/week -With gradual improvement in calcium and starting vitamin D, patient may not need supplemental calcium at  discharge.      #T2DM Patient is hyperglycemic while on prednisone. Controlled on current regimen.  -Continue Semglee 40units once daily, aspart 15units TID, and continue SSI -Continue CBG checks   #Paroxsymal Atrial Fibrillation  Patient was recently diagnosed with paroxsymal Afib during prior hospitalization thought to be caused from the duo nebs containing albuterol. Echo July 2024 noted severe left atrial dilation, which could also cause afib.  -Continue Eliquis 5mg  BID -Avoid Beta2 agonists    #HTN  -Continue home amlodipine 10mg  daily and Entresto 24-26mg  BID.  -Continue to hold Spironolactone and Torsemide.     #EKG abnormalities Patient had 3 beats of unsustained Vtach last night that resolved without intervention.   -Continue cardiac telemetry   #Dyslipidemia Continue home ezetimibe 10mg  daily and lipitor 80mg  daily   #Anxiety Continue home hydroxyzine PRN   Best Practice: Diet: heart healthy/carb modified IVF: none VTE: Eliquis  AB: none Therapy Recs: PT/OT inpatient, HH PT/OT recommended Family Contact: Daughter, Alicia [name], updated today    Prior to Admission Living Arrangement: Home Anticipated Discharge Location: Home Barriers to Discharge: pending resolution of acute on chronic hypoxic respiratory failure and decrease in O2 need Dispo: Anticipated discharge in approximately 1-3 days    LOS: 7 days   Lang Snow, Medical Student 07/26/2023, 12:41 PM  Attestation for Student Documentation:  I personally was present and performed or re-performed the history, physical exam and medical decision-making activities of this service and have verified that the service and findings are accurately documented in the student's note.  Marrianne Mood MD 07/26/2023, 6:51 PM

## 2023-07-26 NOTE — Progress Notes (Signed)
OT Cancellation Note  Patient Details Name: Alicia [name] MRN: [id number] DOB: [birth year]   Cancelled Treatment:    Reason Eval/Treat Not Completed: Patient at procedure or test/ unavailable (Patient leaving unit for CT. Will attempt later today as schedule permits) Alfonse Flavors, OTA Acute Rehabilitation Services  Office [phone]  Dewain Penning 07/26/2023, 12:57 PM

## 2023-07-26 NOTE — Care Management Important Message (Signed)
Important Message  Patient Details  Name: Alicia [name] MRN: [id number] Date of Birth: 01-11-1956   Medicare Important Message Given:  Yes     Renie Ora 07/26/2023, 10:08 AM

## 2023-07-27 DIAGNOSIS — U071 COVID-19: Secondary | ICD-10-CM | POA: Diagnosis not present

## 2023-07-27 DIAGNOSIS — J1282 Pneumonia due to coronavirus disease 2019: Secondary | ICD-10-CM | POA: Diagnosis not present

## 2023-07-27 DIAGNOSIS — J9621 Acute and chronic respiratory failure with hypoxia: Secondary | ICD-10-CM | POA: Diagnosis not present

## 2023-07-27 DIAGNOSIS — J441 Chronic obstructive pulmonary disease with (acute) exacerbation: Secondary | ICD-10-CM | POA: Diagnosis not present

## 2023-07-27 LAB — GLUCOSE, CAPILLARY
Glucose-Capillary: 246 mg/dL — ABNORMAL HIGH (ref 70–99)
Glucose-Capillary: 97 mg/dL (ref 70–99)
Glucose-Capillary: 115 mg/dL — ABNORMAL HIGH (ref 70–99)
Glucose-Capillary: 311 mg/dL — ABNORMAL HIGH (ref 70–99)

## 2023-07-27 LAB — RENAL FUNCTION PANEL
Albumin: 2.7 g/dL — ABNORMAL LOW (ref 3.5–5.0)
Anion gap: 11 (ref 5–15)
BUN: 54 mg/dL — ABNORMAL HIGH (ref 8–23)
CO2: 30 mmol/L (ref 22–32)
Calcium: 8.8 mg/dL — ABNORMAL LOW (ref 8.9–10.3)
Chloride: 91 mmol/L — ABNORMAL LOW (ref 98–111)
Creatinine, Ser: 1.31 mg/dL — ABNORMAL HIGH (ref 0.44–1.00)
GFR, Estimated: 45 mL/min — ABNORMAL LOW (ref >60)
Glucose, Bld: 308 mg/dL — ABNORMAL HIGH (ref 70–99)
Phosphorus: 4.8 mg/dL — ABNORMAL HIGH (ref 2.5–4.6)
Potassium: 4.9 mmol/L (ref 3.5–5.1)
Sodium: 132 mmol/L — ABNORMAL LOW (ref 135–145)

## 2023-07-27 LAB — CBC
HCT: 35.4 % — ABNORMAL LOW (ref 36.0–46.0)
Hemoglobin: 11 g/dL — ABNORMAL LOW (ref 12.0–15.0)
MCH: 25.2 pg — ABNORMAL LOW (ref 26.0–34.0)
MCHC: 31.1 g/dL (ref 30.0–36.0)
MCV: 81.2 fL (ref 80.0–100.0)
Platelets: 646 10*3/uL — ABNORMAL HIGH (ref 150–400)
RBC: 4.36 MIL/uL (ref 3.87–5.11)
RDW: 16.3 % — ABNORMAL HIGH (ref 11.5–15.5)
WBC: 16.6 10*3/uL — ABNORMAL HIGH (ref 4.0–10.5)
nRBC: 1.2 % — ABNORMAL HIGH (ref 0.0–0.2)

## 2023-07-27 LAB — MAGNESIUM: Magnesium: 2 mg/dL (ref 1.7–2.4)

## 2023-07-27 MED ORDER — INSULIN ASPART 100 UNIT/ML IJ SOLN
0.0000 [IU] | Freq: Three times a day (TID) | INTRAMUSCULAR | Status: DC
  Administered 2023-07-27: 2 [IU] via SUBCUTANEOUS
  Administered 2023-07-28 (×2): 3 [IU] via SUBCUTANEOUS

## 2023-07-27 MED ORDER — FUROSEMIDE 10 MG/ML IJ SOLN
120.0000 mg | Freq: Once | INTRAVENOUS | Status: AC
  Administered 2023-07-27: 120 mg via INTRAVENOUS
  Filled 2023-07-27: qty 10

## 2023-07-27 MED ORDER — FUROSEMIDE 10 MG/ML IJ SOLN
120.0000 mg | Freq: Once | INTRAVENOUS | Status: AC
  Administered 2023-07-27: 120 mg via INTRAVENOUS
  Filled 2023-07-27: qty 2

## 2023-07-27 MED ORDER — INSULIN ASPART 100 UNIT/ML IJ SOLN
10.0000 [IU] | Freq: Three times a day (TID) | INTRAMUSCULAR | Status: DC
  Administered 2023-07-27 – 2023-07-28 (×3): 10 [IU] via SUBCUTANEOUS

## 2023-07-27 MED ORDER — INSULIN ASPART 100 UNIT/ML IJ SOLN
0.0000 [IU] | Freq: Every day | INTRAMUSCULAR | Status: DC
  Administered 2023-07-27: 4 [IU] via SUBCUTANEOUS

## 2023-07-27 NOTE — Inpatient Diabetes Management (Addendum)
Inpatient Diabetes Program Recommendations  AACE/ADA: New Consensus Statement on Inpatient Glycemic Control (2015)  Target Ranges:  Prepandial:   less than 140 mg/dL      Peak postprandial:   less than 180 mg/dL (1-2 hours)      Critically ill patients:  140 - 180 mg/dL   Lab Results  Component Value Date   GLUCAP 97 07/27/2023   HGBA1C 8.2 (A) 05/03/2023    Review of Glycemic Control  Latest Reference Range & Units 07/26/23 11:32 07/26/23 15:53 07/26/23 21:01 07/27/23 06:24 07/27/23 11:19  Glucose-Capillary 70 - 99 mg/dL 84 161 (H) 096 (H) 045 (H) 97   Diabetes history: DM 2 Outpatient Diabetes medications:  Trulicity 1.5 mg weekly, Lantus 30 units daily (per patient she varies dose based on CBG's) Humalog 14 units before breakfast and lunch/ 20 units before dinner Current orders for Inpatient glycemic control:  Novolog 0-20 units tid with meals and HS Novolog 15 units tid with meals Semglee 40 units daily Prednisone 40 mg daily Inpatient Diabetes Program Recommendations:    Note patient did not receive any Novolog meal coverage (15 units) yesterday or the day before based on documentation. This likely explains why CBG so high last night.   -Consider reducing meal coverage to 10 units tid with meals and reduce Novolog correction to moderate tid with meals and HS.   Thanks,  Beryl Meager, RN, BC-ADM Inpatient Diabetes Coordinator Pager [phone] (8a-5p)

## 2023-07-27 NOTE — Progress Notes (Addendum)
Subjective: Alicia [name] is a 67 y.o. person living with a history of COPD, HFrecEF, Paroxsymal Afib, and T2DM who presented with dyspnea and admitted for Acute on Chronic Hypoxic Respiratory Failure likely due to COVID-19 infection.   Patient started on Unasyn overnight after CT chest resulted with bilateral lower lobe pneumonia vs aspiration pneumonitis. Mild leukocytosis 16.6 from 13.3. She has been afebrile.  Patient seen at bedside this morning. Reports feeling a lot better today, slept better last night, and has more energy today. She reports improvement in cough. No change in sputum and sputum is clear. She reports not feeling SOB on 3L [place].   Objective:  Vital signs in last 24 hours: Vitals:   07/27/23 0627 07/27/23 0733 07/27/23 0801 07/27/23 1121  BP:  (!) 163/69  (!) 144/57  Pulse: 63 66  66  Resp:   16 16  Temp:  98.2 F (36.8 C)  98.7 F (37.1 C)  TempSrc:  Oral  Oral  SpO2: 93% 95%  93%  Weight:      Height:       Weight change:   Intake/Output Summary (Last 24 hours) at 07/27/2023 1304 Last data filed at 07/27/2023 0600 Gross per 24 hour  Intake 405.27 ml  Output 2450 ml  Net -2044.73 ml   Physical Exam: General: NAD, alert and oriented, cooperative.  HEENT: NCAT, vision grossly intact, moist mucous membrane.  Neck: Supple, trachea midline. Lungs: normal respiratory effort on 3L [place]. No wheezes. Scant crackle in left lung base.  Heart: RRR, no M/R/G.  Abdomen: +BS. Soft. Nontender, nondistended.  Extremities: No LE edema. Neuro: alert and oriented. Nonfocal.  Skin: warm, dry, good turgor.  Psych: cooperative, appropriate mood and affect.  CT CHEST WO CONTRAST  Result Date: 07/26/2023 CLINICAL DATA:  Pneumonia. EXAM: CT CHEST WITHOUT CONTRAST TECHNIQUE: Multidetector CT imaging of the chest was performed following the standard protocol without IV contrast. RADIATION DOSE REDUCTION: This exam was performed according to the departmental dose-optimization  program which includes automated exposure control, adjustment of the mA and/or kV according to patient size and/or use of iterative reconstruction technique. COMPARISON:  03/16/2023 FINDINGS: Cardiovascular: Coronary artery calcification and aortic atherosclerotic calcification. No pericardial effusion Mediastinum/Nodes: No axillary or supraclavicular adenopathy. No mediastinal or hilar adenopathy. No pericardial fluid. Esophagus normal. Lungs/Pleura: Patchy consolidation in the RIGHT lower lobe and to lesser degree LEFT lower lobe. Findings concerning for pneumonia versus aspiration pneumonitis. Upper lungs are clear. Upper Abdomen: Postcholecystectomy. Musculoskeletal: No aggressive osseous lesion. IMPRESSION: 1. Bilateral lower lobe consolidation concerning for pneumonia versus aspiration pneumonitis. 2.  Aortic Atherosclerosis (ICD10-I70.0). Electronically Signed   By: Genevive Bi M.D.   On: 07/26/2023 17:11     Assessment/Plan:  Principal Problem:   Acute on chronic respiratory failure (HCC) Active Problems:   COPD exacerbation (HCC)   COVID-19 virus infection   Pneumonia   Acute on chronic hypoxic respiratory failure (HCC) #Acute on Chronic Hypoxic Respiratory Failure due to COVID-19 infection #COPD exacerbation due to COVID-19 infection #Covid 19 pneumonia  Patient continues to have hypoxia despite being afebrile with normal white count and receiving treatment for Covid-19. Her acute on chronic hypoxic respiratory failure was likely initially due to Covid but now more likely due to hypervolemia from HFrEF.  -Continue baricitinib 4mg , now on day 7 -Continue prednisone 40 mg for day 7/10 (thru 07/30/23) -Continue ipratropium nebs BID for SOB, robitussin DM nightly for cough, Tylenol PRN for fever, cepacol and phenol PRN for sore throat -Breo-ellipta  and incruse ellipta 1 puff each daily -Wean O2 as tolerated to baseline O2 of 3-4L [place]. O2 sat goal 88-92%.  -Continue PT/OT inpatient, HH  PT/OT recommended   #Bilateral lower lobe pneumonia vs aspiration pneumonitis  CT chest showed bilateral lower lobe consolidation concerning for pneumonia versus aspiration pneumonitis. Concern for aspiration pneumonitis in this patient who has had persistent hypoxia and coughing spells the last couple of days requiring robitussin DM and tessalon pearls. She did receive 2 days of rocephin and 4 days of azithromycin for CAP coverage due to possible pneumonia on admission CXR. After procalcitonin came back <0.10 indicating unlikely bacterial infection, rocephin was discontinued and azithromycin was continued to reduce airway inflammation. -Continue Unasyn for 3 days.   #HFrEF LVEF 55-60% -Gave 120mg  IV Lasix yesterday and urine output was 2,4834mL. Reports feeling better after aggressive diuresis the last three days respiratory failure is slowly improving.   -Gave IV Lasix this morning with good output. Will give another IV Lasix 120mg  this evening and monitor Cr, electrolyte, weight, and output.  -Continue Entresto 24-26 BID. -Continue to hold Spironolactone and Torsemide.   #Hypocalcemia due to Vit D deficiency -Continue Vit D 50,000units 1/week -With gradual improvement in calcium and starting vitamin D, patient may not need supplemental calcium at discharge.      #T2DM Patient is hyperglycemic while on prednisone.  -Continue Semglee 40units once daily, aspart 10units TID, and continue SSI -Continue CBG checks   #Paroxsymal Atrial Fibrillation  Patient was recently diagnosed with paroxsymal Afib during prior hospitalization thought to be caused from the duo nebs containing albuterol. Echo July 2024 noted severe left atrial dilation, which could also cause afib.  -Continue Eliquis 5mg  BID -Avoid Beta2 agonists    #HTN  -Continue home amlodipine 10mg  daily and Entresto 24-26mg  BID.  -Continue to hold Spironolactone and Torsemide.     #Nonsustained Vtach on telemetry -Continue cardiac  telemetry   #Dyslipidemia Continue home ezetimibe 10mg  daily and lipitor 80mg  daily   #Anxiety Continue home hydroxyzine PRN   Best Practice: Diet: heart healthy/carb modified IVF: none VTE: Eliquis  AB: Unasyn Therapy Recs: PT/OT inpatient, HH PT/OT recommended Family Contact: Daughter, [name], to be updated   Prior to Admission Living Arrangement: Home Anticipated Discharge Location: Home Barriers to Discharge: pending resolution of acute on chronic hypoxic respiratory failure and decrease in O2 need Dispo: Anticipated discharge in approximately 1-3 days    LOS: 8 days   Lang Snow, Medical Student 07/27/2023, 1:04 PM

## 2023-07-27 NOTE — Progress Notes (Signed)
Mobility Specialist Progress Note:   07/27/23 1534  Mobility  Activity Ambulated with assistance in room  Level of Assistance Contact guard assist, steadying assist  Assistive Device Front wheel walker  Distance Ambulated (ft) 30 ft  Activity Response Tolerated well  Mobility Referral Yes  $Mobility charge 1 Mobility  Mobility Specialist Start Time (ACUTE ONLY) 1445  Mobility Specialist Stop Time (ACUTE ONLY) 1527  Mobility Specialist Time Calculation (min) (ACUTE ONLY) 42 min   Pre Mobility: 69 HR , 98% SpO2 4 L During Mobility: 75 HR , 88%-97% SpO2 4 L Post Mobility: 71 HR , 100% SpO2 4 L  Pt received in bed, agreeable to mobility. Upon standing pt requesting to use BSC. Void successful. Assisted with pericare. Pt able to ambulate in room with CG for safety. Pt c/o lightheadedness and slight SOB during session. Pursed lip breathing encouraged. Pt left in chair with call bell in reach and all needs met. RN notified.   Leory Plowman  Mobility Specialist Please contact via [employer] office at [phone]

## 2023-07-27 NOTE — Progress Notes (Signed)
Pt switch to nasal cannula. O2 sat between 88-93. Pt is tolerating well. Maria Gomez-Caraballo,MD notified.

## 2023-07-28 ENCOUNTER — Other Ambulatory Visit (HOSPITAL_COMMUNITY): Payer: Self-pay

## 2023-07-28 ENCOUNTER — Ambulatory Visit (HOSPITAL_COMMUNITY): Payer: 59

## 2023-07-28 ENCOUNTER — Other Ambulatory Visit: Payer: Self-pay | Admitting: Nurse Practitioner

## 2023-07-28 ENCOUNTER — Other Ambulatory Visit: Payer: Self-pay | Admitting: Internal Medicine

## 2023-07-28 ENCOUNTER — Other Ambulatory Visit: Payer: Self-pay | Admitting: Student in an Organized Health Care Education/Training Program

## 2023-07-28 ENCOUNTER — Other Ambulatory Visit: Payer: Self-pay | Admitting: Student

## 2023-07-28 DIAGNOSIS — Z87891 Personal history of nicotine dependence: Secondary | ICD-10-CM | POA: Diagnosis not present

## 2023-07-28 DIAGNOSIS — E113393 Type 2 diabetes mellitus with moderate nonproliferative diabetic retinopathy without macular edema, bilateral: Secondary | ICD-10-CM

## 2023-07-28 DIAGNOSIS — J441 Chronic obstructive pulmonary disease with (acute) exacerbation: Secondary | ICD-10-CM

## 2023-07-28 DIAGNOSIS — J9621 Acute and chronic respiratory failure with hypoxia: Secondary | ICD-10-CM | POA: Diagnosis not present

## 2023-07-28 DIAGNOSIS — U071 COVID-19: Secondary | ICD-10-CM | POA: Diagnosis not present

## 2023-07-28 LAB — RENAL FUNCTION PANEL
Albumin: 2.5 g/dL — ABNORMAL LOW (ref 3.5–5.0)
Anion gap: 9 (ref 5–15)
BUN: 49 mg/dL — ABNORMAL HIGH (ref 8–23)
CO2: 32 mmol/L (ref 22–32)
Calcium: 8.4 mg/dL — ABNORMAL LOW (ref 8.9–10.3)
Chloride: 94 mmol/L — ABNORMAL LOW (ref 98–111)
Creatinine, Ser: 1.09 mg/dL — ABNORMAL HIGH (ref 0.44–1.00)
GFR, Estimated: 56 mL/min — ABNORMAL LOW (ref >60)
Glucose, Bld: 241 mg/dL — ABNORMAL HIGH (ref 70–99)
Phosphorus: 3.9 mg/dL (ref 2.5–4.6)
Potassium: 4.3 mmol/L (ref 3.5–5.1)
Sodium: 135 mmol/L (ref 135–145)

## 2023-07-28 LAB — GLUCOSE, CAPILLARY
Glucose-Capillary: 172 mg/dL — ABNORMAL HIGH (ref 70–99)
Glucose-Capillary: 153 mg/dL — ABNORMAL HIGH (ref 70–99)

## 2023-07-28 LAB — CBC WITH DIFFERENTIAL/PLATELET
Abs Immature Granulocytes: 0.8 10*3/uL — ABNORMAL HIGH (ref 0.00–0.07)
Basophils Absolute: 0.1 10*3/uL (ref 0.0–0.1)
Basophils Relative: 1 %
Eosinophils Absolute: 0 10*3/uL (ref 0.0–0.5)
Eosinophils Relative: 0 %
HCT: 33.5 % — ABNORMAL LOW (ref 36.0–46.0)
Hemoglobin: 10.4 g/dL — ABNORMAL LOW (ref 12.0–15.0)
Immature Granulocytes: 5 %
Lymphocytes Relative: 11 %
Lymphs Abs: 1.8 10*3/uL (ref 0.7–4.0)
MCH: 25.9 pg — ABNORMAL LOW (ref 26.0–34.0)
MCHC: 31 g/dL (ref 30.0–36.0)
MCV: 83.3 fL (ref 80.0–100.0)
Monocytes Absolute: 1.4 10*3/uL — ABNORMAL HIGH (ref 0.1–1.0)
Monocytes Relative: 9 %
Neutro Abs: 11.7 10*3/uL — ABNORMAL HIGH (ref 1.7–7.7)
Neutrophils Relative %: 74 %
Platelets: 661 10*3/uL — ABNORMAL HIGH (ref 150–400)
RBC: 4.02 MIL/uL (ref 3.87–5.11)
RDW: 16.7 % — ABNORMAL HIGH (ref 11.5–15.5)
WBC: 15.7 10*3/uL — ABNORMAL HIGH (ref 4.0–10.5)
nRBC: 1.3 % — ABNORMAL HIGH (ref 0.0–0.2)

## 2023-07-28 MED ORDER — BLOOD GLUCOSE TEST VI STRP
1.0000 | ORAL_STRIP | Freq: Three times a day (TID) | 0 refills | Status: DC
  Filled 2023-07-28: qty 100, 34d supply, fill #0

## 2023-07-28 MED ORDER — INSULIN PEN NEEDLE 31G X 8 MM MISC
1.0000 | Freq: Three times a day (TID) | 0 refills | Status: DC
  Filled 2023-07-28: qty 100, 30d supply, fill #0

## 2023-07-28 MED ORDER — LANCETS MISC
1.0000 | Freq: Three times a day (TID) | 0 refills | Status: DC
  Filled 2023-07-28: qty 100, 30d supply, fill #0

## 2023-07-28 MED ORDER — VITAMIN D (ERGOCALCIFEROL) 1.25 MG (50000 UNIT) PO CAPS
50000.0000 [IU] | ORAL_CAPSULE | ORAL | 3 refills | Status: AC
Start: 2023-07-28 — End: ?
  Filled 2023-07-28: qty 5, 35d supply, fill #0

## 2023-07-28 MED ORDER — PREDNISONE 10 MG PO TABS
10.0000 mg | ORAL_TABLET | Freq: Every day | ORAL | 0 refills | Status: AC
  Filled 2023-07-28: qty 1, 1d supply, fill #0

## 2023-07-28 MED ORDER — AMOXICILLIN-POT CLAVULANATE 875-125 MG PO TABS
1.0000 | ORAL_TABLET | Freq: Two times a day (BID) | ORAL | 0 refills | Status: AC
Start: 2023-07-29 — End: 2023-07-31
  Filled 2023-07-28: qty 3, 2d supply, fill #0

## 2023-07-28 MED ORDER — INSULIN LISPRO (1 UNIT DIAL) 100 UNIT/ML (KWIKPEN)
PEN_INJECTOR | SUBCUTANEOUS | 3 refills | Status: DC
  Filled 2023-07-28 (×2): qty 15, 32d supply, fill #0

## 2023-07-28 MED ORDER — INSULIN LISPRO 100 UNIT/ML IJ SOLN
INTRAMUSCULAR | 3 refills | Status: DC
  Filled 2023-07-28: qty 10, 30d supply, fill #0

## 2023-07-28 MED ORDER — CARBAMIDE PEROXIDE 6.5 % OT SOLN
5.0000 [drp] | Freq: Two times a day (BID) | OTIC | 0 refills | Status: AC
Start: 2023-07-28 — End: ?
  Filled 2023-07-28: qty 15, 30d supply, fill #0

## 2023-07-28 MED ORDER — LANCET DEVICE MISC
1.0000 | Freq: Three times a day (TID) | 0 refills | Status: DC
  Filled 2023-07-28: qty 1, 30d supply, fill #0

## 2023-07-28 NOTE — Discharge Summary (Addendum)
Name: Alicia [name] MRN: [id number] DOB: 1956/03/24 67 y.o. PCP: [name], MD  Date of Admission: 07/19/2023  5:52 PM Date of Discharge: 07/28/23 Attending Physician: Dr. Criselda Peaches  Discharge Diagnosis: Active Problems:   COVID-19 virus infection   Pneumonia    Discharge Medications: Allergies as of 07/28/2023       Reactions   Canagliflozin Itching, Anxiety, Palpitations        Medication List     STOP taking these medications    insulin lispro 100 UNIT/ML KwikPen Commonly known as: HUMALOG Replaced by: insulin lispro 100 UNIT/ML injection   Medihoney Wound/Burn Dressing Gel   nystatin powder Commonly known as: nystatin   ondansetron 4 MG tablet Commonly known as: Zofran   phenol 1.4 % Liqd Commonly known as: CHLORASEPTIC   senna 8.6 MG Tabs tablet Commonly known as: SENOKOT   simethicone 80 MG chewable tablet Commonly known as: MYLICON       TAKE these medications    Accu-Chek Aviva Plus test strip Generic drug: glucose blood USE TO TEST BLOOD SUGARS AS DIRECTED What changed: See the new instructions.   BLOOD GLUCOSE TEST STRIPS Strp Use as directed to check blood sugar 3 (three) times daily. What changed: You were already taking a medication with the same name, and this prescription was added. Make sure you understand how and when to take each.   accu-chek multiclix lancets Use to check your blood sugar four times daily: early morning, before a meal, two hours after a meal, and bedtime What changed: Another medication with the same name was added. Make sure you understand how and when to take each.   TRUEplus Lancets 28G Misc Use as directed to check blood sugar 3 (three) times daily. What changed: You were already taking a medication with the same name, and this prescription was added. Make sure you understand how and when to take each.   amLODipine 10 MG tablet Commonly known as: NORVASC TAKE 1 TABLET BY MOUTH EVERY DAY    amoxicillin-clavulanate 875-125 MG tablet Commonly known as: AUGMENTIN Take 1 tablet by mouth 2 (two) times daily for 2 days. Start taking on: July 29, 2023   apixaban 5 MG Tabs tablet Commonly known as: ELIQUIS Take 1 tablet (5 mg total) by mouth 2 (two) times daily.   atorvastatin 80 MG tablet Commonly known as: LIPITOR TAKE 1 TABLET BY MOUTH EVERY DAY   carbamide peroxide 6.5 % OTIC solution Commonly known as: DEBROX Place 5 drops into both ears 2 (two) times daily.   Entresto 24-26 MG Generic drug: sacubitril-valsartan Take 1 tablet by mouth 2 (two) times daily.   ezetimibe 10 MG tablet Commonly known as: ZETIA Take 1 tablet (10 mg total) by mouth daily.   guaiFENesin-dextromethorphan 100-10 MG/5ML syrup Commonly known as: ROBITUSSIN DM Take 10 mLs by mouth at bedtime.   hydrOXYzine 25 MG tablet Commonly known as: ATARAX Take 1 tablet (25 mg total) by mouth every 6 (six) hours as needed for anxiety.   insulin glargine 100 UNIT/ML Solostar Pen Commonly known as: LANTUS Inject 30 Units into the skin daily.   insulin lispro 100 UNIT/ML injection Commonly known as: HumaLOG Use as directed Replaces: insulin lispro 100 UNIT/ML KwikPen   insulin lispro 100 UNIT/ML KwikPen Commonly known as: HUMALOG Inject 14 Units into the skin 2 (two) times daily with breakfast and lunch AND 20 Units daily before supper. Only take if eating a meal AND Blood Glucose (BG) is 80 or higher.Marland Kitchen  Insulin Pen Needle 32G X 4 MM Misc Use to inject insulin 4 times a day. The patient is insulin requiring, ICD 10 code 11.10. The patient injects 4 times per day. What changed: Another medication with the same name was added. Make sure you understand how and when to take each.   B-D UF III MINI PEN NEEDLES 31G X 5 MM Misc Generic drug: Insulin Pen Needle USE 3 TIMES A DAY WITH HUMALOG What changed: Another medication with the same name was added. Make sure you understand how and when to take  each.   TechLite Pen Needles 31G X 8 MM Misc Generic drug: Insulin Pen Needle Use 3 (three) times daily. What changed: You were already taking a medication with the same name, and this prescription was added. Make sure you understand how and when to take each.   Insulin Syringe-Needle U-100 31G X 15/64" 0.3 ML Misc Use to inject Humalog before meals three times a day   predniSONE 10 MG tablet Commonly known as: DELTASONE Take 1 tablet (10 mg total) by mouth daily for 1 day. Start taking on: July 29, 2023   spironolactone 25 MG tablet Commonly known as: ALDACTONE Take 12.5 mg by mouth daily.   torsemide 20 MG tablet Commonly known as: DEMADEX Take 2 tablets (40 mg total) by mouth every morning AND 1 tablet (20 mg total) every evening.   Trelegy Ellipta 200-62.5-25 MCG/ACT Aepb Generic drug: Fluticasone-Umeclidin-Vilant Inhale 1 puff into the lungs daily.   TRUEdraw Lancing Device Misc Use 3 (three) times daily.   Trulicity 1.5 MG/0.5ML Sopn Generic drug: Dulaglutide Inject 1.5 mg into the skin once a week.   Vitamin D (Ergocalciferol) 1.25 MG (50000 UNIT) Caps capsule Commonly known as: DRISDOL Take 1 capsule (50,000 Units total) by mouth every 7 (seven) days.        Disposition and follow-up:   Alicia [name] was discharged from [facility] in Stable condition.  At the hospital follow up visit please address:  1.  Follow-up:  A. Acute on chronic respiratory failure and COPD exacerbation due to Covid-19 infection: Patient was on baricitinib for 8 days while inpatient, prednisone for 9 days while inpatient and discharged with 1 day to complete at home, and azithromycin for 4 days inpatient. Patient was febrile at 101.6 on admission but afebrile the remainder of the hospital course. Cough improved significantly. She was able to be weaned down on supplemental oxygen need from 15L nonbreather satting 91% on admission to 4L [place] satting 88-92% at  discharge. Patient will continue PT/OT at home. At appointment please check O2 sat and supplemental oxygen need.    B. Patient remained hypoxic with above baseline supplemental oxygen requirement so a chest CT was ordered on hospital day 7 that showed bilateral lower lobe consolidation concerning for pneumonia versus aspiration pneumonitis. Patient was started on Unasyn inpatient and discharged with Augmentin.  B. AKI on CKD3a: Baseline Cr around 0.9 per discharge in August. Cr on admission was 1.07 and increased to 1.34 overnight. Entresto and Spironolactone held on admission due to elevated Cr. Patient was on reduced dose of Torsemide 20mg  once daily due to hypokalemia, hypomagnesemia, and hypocalcemia present on admission. AKI improved on hospital day 3 with PO hydration. With improvement in Cr, Entresto restarted for HFrEF and blood pressure control. Cr 1.09 on day of discharge despite receiving aggressive diuresis with IV furosemide on the last three days of hospitalization after CXR showed evidence of hypervolemia on hospital day  6. Please check Cr at appointment.   C. T2DM: Patient required increased insulin during hospitalization for glucose control. She was hyperglycemic due to stress from Covid infection and prednisone. Glucose was responsive to insulin. She was on 40 units glargine once daily, 15units aspart TID with meals, and SSI. At discharge she was instructed to resume home Lantus 30units daily, Humalog 14 units with breakfast and lunch, and 20 units with dinner. She is also resumed on Trulicity 1.5mg  once a week at discharge. Please check fasting glucose at appointment and adjust medications as needed.   D. Hypocalcemia due to Vitamin D deficiency: Patient presented with hypocalcemia that was persistent despite repletion. Vit D was checked and came back low at 13.8. PTH high at 146. Patient started on Vitamin D 50,000 units once a week.  E. HFrEF: Patient's spironolactone and entresto held  on admission due to AKI. Patient was started on a reduced dose of Torsemide 20mg  on admission due to hypomagnesemia, hypokalemia, and hypocalcemia. When Cr improved back to baseline, Entresto was restarted inpatient. She had persistent hypoxia despite treatment for Covid so a CXR was done that showed fluid congestion in the lungs and patient was treated with aggressive diuresis with IV Lasix the last three days of hospitalization and finally able to oxygenate back at baseline afterwards. At discharge, patient was instructed to continue her home Spironolactone 12.5mg  once daily, Entresto 24-26mg  BID, and Torsemide 40mg  in the morning and 20mg  in the evening. Please check electrolytes at appointment.   F. Unsustained Vtach: Patient was kept on cardiac telemetry since admission due to electrolyte abnormalities and an abnormal EKG on admission that showed T wave inversions in Leads V5 and V6 that were not present on previous EKGs. The T wave inversion resolved on subsequent EKGs. However; patient was noted to have unsustained Vtach most nights that were 3-4 beats. She had one episode of unsustained Vtach of 8 beats, no Vtach longer than 8 beats noted. She was asymptomatic during all of these episodes and all episodes resolved without intervention. She denied chest pain, palpitations, diaphoresis.   2.  Labs / imaging needed at time of follow-up: Renal function panel, Mg, CBC.   3.  Pending labs/ test needing follow-up: None  4.  Medication Changes  Prednisone 40mg  once daily   End Date: 07/29/09  Augmentin 875mg  twice daily  End Date: 07/29/09   Follow-up Appointments:  Follow-up Information     [facility], [facility] Follow up.   Specialty: Home Health Services Why: HHRN/PT/OT- services to resume- Your provider  for home health they will call you to re-set up services Contact information: [address] [place] [address] [phone]         [name], MD. Go on  08/09/2023.   Specialty: Internal Medicine Why: Please arrive 15 minutes early for your appointment at 9:15 a.m. Contact information: [address] [place] [address] [phone]                Encounter Information  Provider Department Center  08/05/2023 3:30 PM [facility]   08/09/2023 9:15 AM [name], MD [facility]  08/11/2023 1:45 PM [name], RN [facility] Care Coordination   08/20/2023 4:00 PM [facility] [place] [facility] at [facility]   08/26/2023 11:00 AM Charlott Holler, MD [place] [facility]  Hospital Course by problem list:  #Acute on Chronic Hypoxic Respiratory Failure due to COVID-19 infection #COPD exacerbation due to COVID-19 infection #Viral pneumonia from Covid-19  Patient presented to the emergency department with shortness of breath, productive cough , fever, and chills that started 6 days prior. In the ED, she was requiring increased supplemental O2 with a non-rebreather at 15L and she tested positive for COVID-19. Her acute on chronic respiratory failure was due to COVID 19 virus infection causing a COPD exacerbation and viral pneumonia. Patient was not a candidate for paxlovid or remdesivir due to symptom length of greater than 5 days. Patient started on baricitinib 2mg  daily initially due to mildly elevated creatinine and increased to 4mg  daily on hospital day 5 after Cr improved.  Patient had hospital acquired pneumonia during prior hospitalization, current CXR on admission cannot rule out pneumonia in left lower lobes and it may be showing remnants of her prior hospital acquired penumonia vs new viral pneumonia; however, given the questionable pneumonia during a COPD exacerbation, patient started on rocephin and azithromycin due to concern for community acquired  pneumonia. After procalcitonin came back <0.10 indicating unlikely bacterial infection, rocephin discontinued and azithromycin 500mg  continued for 3 more days to reduce airway inflammation in COPD. Patient received two days of rocephin and 4 days of azithromycin in total. On admission, patient also started on prednisone 40 mg for 10days, per COVID-19 UpToDate guidelines. For symptomatic relief, patient also on ipratropium nebs BID for SOB, robitussin DM nightly for cough, Tylenol PRN for fever, cepacol and phenol PRN for sore throat. Patient also on Breo-ellipta and incruse ellipta 1 puff each daily. Blood cultures drawn on admission showed no growth at 5 days. Patient was weaned from O2 as tolerated to baseline 3-4L and O2 sat goal 88-92%. PT and OT evaluated patient and recommended [facility] PT/OT services at discharge.  #HFrEF LVEF 55-60% On admission patient denies symptoms of heart failure exacerbation and her weight is stable from one week prior at the cardiology office, making HFrecEF exacerbation less likely. She last saw on cardiology on 07/14/23 and is on GDMT: Spironolactone, entresto, torsemide. Patient started on Torsemide 20mg  on admission instead of home Torsemide 60mg  in setting of electrolyte abnormalities. No JVD, LE edema on exam. Spironolactone and Entresto were initially held in setting of elevated Cr but Entresto restarted once Cr improved. She had persistent hypoxia despite treatment for Covid-19 and stable WBCs so a CXR was done that showed fluid congestion in the lungs and patient was treated with aggressive diuresis with IV Lasix the last three days of hospitalization and finally able to oxygenate back at baseline afterwards. Her acute on chronic hypoxic respiratory failure was likely initially due to Covid but later due to hypervolemia from HFrEF considering patient has been Torsemide 20mg  instead of home Torsemide 60mg  she gets at home due to electrolyte abnormalities on admission and mildly  increased Cr. At discharge, patient was instructed to continue her home Spironolactone 12.5mg  once daily, Entresto 24-26mg  BID, and Torsemide 40mg  in the morning and 20mg  in the evening.   #Bilateral lower lobe pneumonia vs aspiration pneumonitis  CT chest ordered on hospital day 7 due to continued hypoxia and concern for development of ARDS. CT chest showed bilateral lower lobe consolidation concerning for pneumonia versus aspiration pneumonitis. Concern for aspiration pneumonitis in this patient who has had persistent hypoxia and coughing spells the last couple of days requiring robitussin DM and tessalon pearls. Patient started on Unasyn inpatient and discharged with Augmentin  #  EKG abnormalities #Unsustained Vtach On admission EKG shows ST elevation in leads III and aVF and T wave inversion in lead V1-V2. Patient denied chest pain. ST elevations in leads III and aVF present on previous EKG but T wave inversion werenew. EKG abnormalities likely due to electrolyte abnormalities. Follow up EKG normal was normal. ST changes and T wave changes resolved. Patient was kept on cardiac telemetry this hospitalization and was noted to have unsustained Vtach most nights that were 3-4 beats. She had one episode of unsustained Vtach of 8 beats, no Vtach longer than 8 beats noted. She was asymptomatic during all of these episodes and all episodes resolved without intervention. She denied chest pain, palpitations, diaphoresis.    #Hypocalcemia due to Vitamin D deficiency  #Hypokalemia - resolved #Hypomagnesemia - resolved Patient presented to the ED and was found to have a calcium level of 6.0, corrected to 7.3, and an ionized calcium of 0.79, Magnesium 1.3, and potassium of 3.3. Hypocalcemia, hypokalemia, and hypomagnesemia were likely due to increase in torsemide dose from 40 mg total to 60 mg total daily on 8/21. Electrolytes were repleted on admission. Follow up renal function panel showed Mg 1.8, potassium 4.7,  and corrected calcium 7.4. Electrlytes were repleted once again. On hospital day 2, potassium and Mg normal. Corr Ca 8.4. Vitamin D low at 13.8 and PTH high at 146, suggesting vitamin D deficiency. Patient started on Vit D 50,000units 1/week. With gradual improvement in calcium and starting vitamin D, patient did not supplemental calcium at discharge.    #T2DM Patient required increased insulin during hospitalization for glucose control. She was hyperglycemic due to stress from Covid infection and prednisone. Glucose was responsive to insulin. She was on 40 units glargine once daily, 15units aspart TID with meals, and SSI. At discharge she was instructed to resume home Lantus 30units daily, Humalog 14 units with breakfast and lunch, and 20 units with dinner. She is also resumed on Trulicity 1.5mg  once a week at discharge.    #Paroxsymal Atrial Fibrillation  Patient was recently diagnosed with paroxsymal Afib during prior hospitalization thought to be caused from the duo nebs containing albuterol. Echo July 2024 noted severe left atrial dilation, which could also cause afib. She was continued on home Eliquis 5mg  BID and avoided Beta2 agonists this hospitalization.    #HTN Continued home amlodipine 10mg .   #Dyslipidemia Continued home ezetimibe 10mg  daily  and Lipitor 80mg  daily    #Anxiety Continued home hydroxyzine PRN  #Mildly elevated Cr Baseline Cr around 0.9 per discharge in August. Cr on admission was 1.07 and increased to 1.34 overnight. Entresto and Spironolactone held on admission due to elevated Cr. Patient was on reduced dose of Torsemide 20mg  once daily due to hypokalemia, hypomagnesemia, and hypocalcemia present on admission. AKI improved on hospital day 3 with PO hydration. With improvement in Cr, Entresto restarted for HFrEF and blood pressure control. Cr 1.09 on day of discharge despite receiving aggressive diuresis with IV furosemide on the last three days of hospitalization after  CXR showed evidence of hypervolemia on hospital day 6.   Discharge Subjective: Patient seen at bedside this morning reports feeling much better today. She reports being able to breath better and take deep breaths without feeling chest tightness. Reports improvement in cough and breathing comfortably on 4L [place] which she uses at baseline. She worked with mobility specialist yesterday and did not feel SOB and was able to maintain O2 sat to goal on 4L [place]. Denied chest pain, palpitations, orthopnea.  Discharge Exam:   BP (!) 144/67 (BP Location: Right Arm)   Pulse 68   Temp 98.8 F (37.1 C) (Oral)   Resp 16   Ht 5\' 7"  (1.702 m)   Wt 100.2 kg   LMP  (LMP Unknown)   SpO2 92%   BMI 34.61 kg/m  Physical Exam: General: NAD, alert and oriented, cooperative.  HEENT: NCAT, vision grossly intact, moist mucous membrane.  Neck: Supple, trachea midline. Lungs: normal respiratory effort on 4L [place]. CTAB. No wheezing or crackle on auscultation.  Heart: RRR, no M/R/G.  Abdomen: +BS. Soft. Nontender, nondistended.  Extremities: No LE edema. Neuro: alert and oriented. Nonfocal.  Skin: warm, dry, good turgor.  Psych: cooperative, appropriate mood and affect.  Pertinent Labs, Studies, and Procedures:     Latest Ref Rng & Units 07/28/2023    9:05 AM 07/27/2023    3:42 AM 07/24/2023    3:53 AM  CBC  WBC 4.0 - 10.5 K/uL 15.7  16.6  13.3   Hemoglobin 12.0 - 15.0 g/dL 16.1  09.6  04.5   Hematocrit 36.0 - 46.0 % 33.5  35.4  34.5   Platelets 150 - 400 K/uL 661  646  659        Latest Ref Rng & Units 07/28/2023    9:05 AM 07/27/2023    3:42 AM 07/26/2023    4:10 AM  CMP  Glucose 70 - 99 mg/dL 409  811  88   BUN 8 - 23 mg/dL 49  54  47   Creatinine 0.44 - 1.00 mg/dL 9.14  7.82  9.56   Sodium 135 - 145 mmol/L 135  132  133   Potassium 3.5 - 5.1 mmol/L 4.3  4.9  4.7   Chloride 98 - 111 mmol/L 94  91  92   CO2 22 - 32 mmol/L 32  30  33   Calcium 8.9 - 10.3 mg/dL 8.4  8.8  9.1     CT CHEST WO  CONTRAST  Result Date: 07/26/2023 CLINICAL DATA:  Pneumonia. EXAM: CT CHEST WITHOUT CONTRAST TECHNIQUE: Multidetector CT imaging of the chest was performed following the standard protocol without IV contrast. RADIATION DOSE REDUCTION: This exam was performed according to the departmental dose-optimization program which includes automated exposure control, adjustment of the mA and/or kV according to patient size and/or use of iterative reconstruction technique. COMPARISON:  03/16/2023 FINDINGS: Cardiovascular: Coronary artery calcification and aortic atherosclerotic calcification. No pericardial effusion Mediastinum/Nodes: No axillary or supraclavicular adenopathy. No mediastinal or hilar adenopathy. No pericardial fluid. Esophagus normal. Lungs/Pleura: Patchy consolidation in the RIGHT lower lobe and to lesser degree LEFT lower lobe. Findings concerning for pneumonia versus aspiration pneumonitis. Upper lungs are clear. Upper Abdomen: Postcholecystectomy. Musculoskeletal: No aggressive osseous lesion. IMPRESSION: 1. Bilateral lower lobe consolidation concerning for pneumonia versus aspiration pneumonitis. 2.  Aortic Atherosclerosis (ICD10-I70.0). Electronically Signed   By: Genevive Bi M.D.   On: 07/26/2023 17:11   DG CHEST PORT 1 VIEW  Result Date: 07/25/2023 CLINICAL DATA:  Hypoxia. EXAM: PORTABLE CHEST 1 VIEW COMPARISON:  07/19/2023 FINDINGS: Low volume film. The cardio pericardial silhouette is enlarged. Diffuse hazy opacity over the left lung is new in the interval and may reflect asymmetric overlying soft tissue on this mildly rotated film. Layering pleural effusion not excluded. Right base collapse/consolidation is mildly progressive in the interval with probable small right pleural effusion. There is pulmonary vascular congestion without overt pulmonary edema. No acute bony abnormality. Telemetry leads overlie the chest. IMPRESSION: 1. Low  volume film with hazy opacity over the left lung, new in the  interval. This may be related to asymmetric overlying soft tissue on this mildly rotated film. Layering pleural effusion not excluded. 2. Progressive right base collapse/consolidation with probable small right pleural effusion. Electronically Signed   By: Kennith Center M.D.   On: 07/25/2023 08:56     DG Chest Port 1 View  Result Date: 07/19/2023 CLINICAL DATA:  Increasing shortness of breath for several days EXAM: PORTABLE CHEST 1 VIEW COMPARISON:  06/25/2023 FINDINGS: Single frontal view of the chest demonstrates an enlarged cardiac silhouette. Continued central vascular congestion, with persistent but improving bibasilar consolidation. Trace left pleural effusion. No pneumothorax. IMPRESSION: 1. Constellation of findings most consistent with congestive heart failure, with slight improvement in volume status since prior study. Superimposed pneumonia would be difficult to exclude. Electronically Signed   By: Sharlet Salina M.D.   On: 07/19/2023 19:05   Discharge Instructions: Discharge Instructions     Activity as tolerated - No restrictions   Complete by: As directed    Diet - low sodium heart healthy   Complete by: As directed    Face-to-face encounter (required for Medicare/Medicaid patients)   Complete by: As directed    I Marrianne Mood certify that this patient is under my care and that I, or a nurse practitioner or physician's assistant working with me, had a face-to-face encounter that meets the physician face-to-face encounter requirements with this patient on 07/28/2023. The encounter with the patient was in whole, or in part for the following medical condition(s) which is the primary reason for home health care (List medical condition): acute on chronic hypoxic respiratory failure   The encounter with the patient was in whole, or in part, for the following medical condition, which is the primary reason for home health care: Acute on chronic hypoxic respiratory failure   I certify that,  based on my findings, the following services are medically necessary home health services:  Nursing Physical therapy     Reason for Medically Necessary Home Health Services:  Therapy- Investment banker, operational, Patent examiner Therapy- Instruction on use of Assistive Device for Ambulation on all Surfaces Therapy- Instruction on Safe use of Assistive Devices for ADLs Therapy- Therapeutic Exercises to Increase Strength and Endurance     My clinical findings support the need for the above services: Shortness of breath with activity   Further, I certify that my clinical findings support that this patient is homebound due to: Shortness of Breath with activity   Home Health   Complete by: As directed    To provide the following care/treatments:  PT OT RN       Dear Ms. Nifong,  It was a pleasure taking care of you at [facility]. You were admitted for shortness of breath and treated for acute on chronic hypoxic respiratory failure and COPD exacerbation due to Covid-19 infection. We are discharging you home now that you are doing better. Please follow the following instructions.   1) For acute on chronic hypoxic respiratory failure, COPD exacerbation, and viral pneumonia due to Covid-19 infection, continue to take prednisone for 1 day at home on 07/29/23 to complete course of prednisone. Please wear a mask around other people for the next two days to complete Covid isolation course. Continue to use your home oxygen of 3-4L as needed. Please work with home health PT and OT. You have an appointment with your pulmonologist on 08/20/23 at 4PM and on  08/26/23 at 11AM.   2) For aspiration pneumonitis, take Augmentin 875mg  once tonight (07/28/23) and twice tomorrow (07/29/23) to complete course of antibiotics.   3) For your low calcium due to vitamin D deficiency, continue to take Vitamin D 50,000units once a week.  4) For heart failure, continue to take Spironolactone 12.5mg  once daily,  Entresto 24-26mg  twice daily, and Torsemide 20mg  once daily. You have an appointment with cardiology on 08/05/23 at 3:30PM.   5) For Type 2 Diabetes, please check your blood sugar before 4 times daily, before meals and before bedtime. Continue to take 30 units of Lantus once a day and 14units of Lispro with breakfast and lunch, and 20units with dinner. Continue your Trulicity 1.5mg  once a week.   6) For paroxysmal atrial fibrillation, continue to take Eliquis 5mg  twice daily.   7) For high blood pressure, continue to take amlodipine 10mg  once daily.   8) For high cholesterol, continue to take ezetimibe 10mg  daily and Lipitor 80mg  daily.   9) For anxiety, continue hydroxyzine 25mg  every 6 hours as needed.   10) You have a follow up appointment with your primary care physician, Dr. Oswaldo Done, on 08/09/23 at 9:15AM at the Internal Medicine Clinic located at the ground floor of [facility].   Take care,  Lang Snow, [place]   Signed: Lang Snow, Medical Student 07/28/2023, 2:05 PM   Pager: [phone]

## 2023-07-28 NOTE — Consult Note (Signed)
   Value-Based Care Institute  [facility] Inpatient Consult   07/28/2023  Ebelin Aniol Shuffield August 26, 1956 [id number]  [name]  Accountable Care Organization [ACO] Patient: Advertising copywriter Dual  Primary Care Provider:  [name], MD [facility]   Patient is currently active with [name] Care Management for chronic disease management services.  Patient has been engaged by a Airline pilot. The community based plan of care has focused on disease management and community resource support.     Plan: Update RN CC of any new follow up needs for readmission prevention measures and care coordination.   Of note, [facility] Care Management services does not replace or interfere with any services that are needed or arranged by inpatient [facility] care management team.   For additional questions or referrals please contact:  Charlesetta Shanks, RN BSN CCM [facility]  [phone] business mobile phone Toll free office [phone]  *Concierge Line  [phone] Fax number: [phone] [email or web address]

## 2023-07-28 NOTE — Plan of Care (Signed)
Plan of care reviewed. Problem: Clinical Measurements: COPD, COVID-19, dyspnea, acute/chronic respiratory failure.  Goal: Respiratory complications will improve Outcome: Progressing; on 4 LPM of NCL as her baseline oxygen at home. She has strong cough with minimal white secretion. Using incentive spirometer is encouraged. No respiratory distress noted tonight.    Problem: Clinical Measurements: Hx of HFrEF and paroxymal Afib Goal: Cardiovascular complication will be avoided Outcome: Progressing: stable hemodynamically. HR 54-70s, NSR/ SB on the monitor BP stable.    Problem: Activity:She presented SOB with exertion when ambulated to bedside commode.  Goal: Risk for activity intolerance will decrease Outcome: Progressing   Problem: Elimination: PureWick provided due to SOB with exertion and urgency.  Goal: Will not experience complications related to urinary retention Outcome: Progressing   Problem: Elimination: has complaints of bloating and stomach discomfort. Goal: Will not experience complications related to bowel motility Outcome: Progressing: Pt had BM x 3 in the past 24 hours. No diarrhea. Positive flatulent. Denied N/V. Simethicone was given. Symptoms was relieved.   Problem: Pain Managment: Headache. Goal: General experience of comfort will improve Outcome: Progressing; Tylenol given. Symptom was relieved. She has been able to rest well overnight.   Filiberto Pinks, RN

## 2023-07-28 NOTE — TOC Transition Note (Signed)
Transition of Care (TOC) - CM/SW Discharge Note Donn Pierini RN, BSN Transitions of Care Unit 4E- RN Case Manager See Treatment Team for direct phone #   Patient Details  Name: Alicia [name] MRN: [id number] Date of Birth: December 06, 1955  Transition of Care [facility]) CM/SW Contact:  Darrold Span, RN Phone Number: 07/28/2023, 11:44 AM   Clinical Narrative:    Pt stable for transition home today, Orders have been placed for resumption of HH needs- RN/PT/OT - pt is active with [facility]. Cm notified liaison for resumption of HH services.  Pt has home 02 w/ [employer], family should be able to bring tank for transport home.   No further TOC needs noted.    Final next level of care: Home w Home Health Services Barriers to Discharge: Barriers Resolved   Patient Goals and CMS Choice CMS Medicare.gov Compare Post Acute Care list provided to:: Patient Choice offered to / list presented to : Patient  Discharge Placement                 Home w/ [facility]        Discharge Plan and Services Additional resources added to the After Visit Summary for   In-house Referral: NA Discharge Planning Services: CM Consult Post Acute Care Choice: Home Health, Resumption of Svcs/PTA Provider          DME Arranged: N/A         HH Arranged: RN, PT, OT HH Agency: [facility] Date [facility] Agency Contacted: 07/22/23 Time HH Agency Contacted: 1415 Representative spoke with at [facility] Agency: Kandee Keen  Social Determinants of Health (SDOH) Interventions SDOH Screenings   Food Insecurity: No Food Insecurity (06/18/2023)  Housing: Low Risk  (06/18/2023)  Transportation Needs: No Transportation Needs (06/18/2023)  Utilities: Not At Risk (06/18/2023)  Alcohol Screen: Low Risk  (06/18/2023)  Depression (PHQ2-9): Low Risk  (07/07/2023)  Financial Resource Strain: Low Risk  (06/18/2023)  Physical Activity: Inactive (05/19/2023)  Social Connections: Socially Isolated (05/19/2023)  Stress: No Stress Concern Present  (05/19/2023)  Tobacco Use: Medium Risk (07/19/2023)     Readmission Risk Interventions    07/28/2023   11:44 AM  Readmission Risk Prevention Plan  Transportation Screening Complete  Medication Review Oceanographer) Complete  HRI or Home Care Consult Complete  SW Recovery Care/Counseling Consult Complete  Palliative Care Screening Not Applicable  Skilled Nursing Facility Not Applicable

## 2023-07-28 NOTE — Progress Notes (Signed)
Physical Therapy Treatment Patient Details Name: Alicia Renicker Achenbach MRN: [id number] DOB: [birth year] Today's Date: 07/28/2023   History of Present Illness Pt is a 67 y/o F admitted on 07/19/23 after presenting with c/o SOB, fevers & productive cough x 6 days. Pt tested positive for COVID-19. PMH: severe COPD on chronic 5L, HFrEF, DM2, paroxysmal a-fib on AC, CKD 3A, HTN, chronic anemia    PT Comments  Pt received in supine and agreeable to session. Pt requesting to use the [facility] at the beginning of the session and was able to have a BM, requiring assist with pericare. Pt able to tolerate 2 short gait trials, however continues to desat with ambulation. Pt's SpO2 as low as 69% on 4L and 72% on 6L, improving with rest breaks and pursed lip breathing, RN notified. Education provided on importance of O2 sat monitoring at home to remain [facility]. Pt continues to benefit from PT services to progress toward functional mobility goals.     If plan is discharge home, recommend the following: A little help with bathing/dressing/bathroom;Assistance with cooking/housework;Help with stairs or ramp for entrance;Assist for transportation   Can travel by private vehicle     Yes  Equipment Recommendations  None recommended by PT    Recommendations for Other Services       Precautions / Restrictions Precautions Precautions: Fall Precaution Comments: COVID, monitor HR and sats Restrictions Weight Bearing Restrictions: No     Mobility  Bed Mobility Overal bed mobility: Modified Independent             General bed mobility comments: increased time    Transfers Overall transfer level: Needs assistance Equipment used: Rolling walker (2 wheels) Transfers: Sit to/from Stand, Bed to chair/wheelchair/BSC Sit to Stand: Supervision     Squat pivot transfers: Supervision     General transfer comment: Supervision for safety and assist with line management    Ambulation/Gait Ambulation/Gait assistance:  Supervision Gait Distance (Feet): 45 Feet (+30) Assistive device: Rolling walker (2 wheels) Gait Pattern/deviations: Step-through pattern, Decreased stride length, Trunk flexed Gait velocity: decr     General Gait Details: Assist with lines and pt demonstrating slow, steady gait with RW support. One seated and standing rest break due to desat      Balance Overall balance assessment: Needs assistance Sitting-balance support: No upper extremity supported, Feet supported Sitting balance-Leahy Scale: Good Sitting balance - Comments: sitting EOB   Standing balance support: During functional activity, Bilateral upper extremity supported Standing balance-Leahy Scale: Fair Standing balance comment: with RW support                            Cognition Arousal: Alert Behavior During Therapy: WFL for tasks assessed/performed Overall Cognitive Status: Within Functional Limits for tasks assessed                                          Exercises      General Comments General comments (skin integrity, edema, etc.): SpO2 as low as 69% with ambulation on 4L improving to >88% on 6L with a seated rest break. SpO2 drop to 72% during second gait trial and improved with standing rest break and once seated back in recliner.      Pertinent Vitals/Pain Pain Assessment Pain Assessment: No/denies pain     PT Goals (current goals can now be found in the  care plan section) Progress towards PT goals: Progressing toward goals    Frequency    Min 1X/week      PT Plan Current plan remains appropriate       AM-PAC PT "6 Clicks" Mobility   Outcome Measure  Help needed turning from your back to your side while in a flat bed without using bedrails?: None Help needed moving from lying on your back to sitting on the side of a flat bed without using bedrails?: None Help needed moving to and from a bed to a chair (including a wheelchair)?: A Little Help needed  standing up from a chair using your arms (e.g., wheelchair or bedside chair)?: A Little Help needed to walk in hospital room?: A Little Help needed climbing 3-5 steps with a railing? : A Lot 6 Click Score: 19    End of Session Equipment Utilized During Treatment: Oxygen;Gait belt Activity Tolerance: Patient tolerated treatment well;Patient limited by fatigue Patient left: with call bell/phone within reach;in chair;with chair alarm set Nurse Communication: Mobility status PT Visit Diagnosis: Other abnormalities of gait and mobility (R26.89);Difficulty in walking, not elsewhere classified (R26.2);Muscle weakness (generalized) (M62.81)     Time: 1610-9604 PT Time Calculation (min) (ACUTE ONLY): 30 min  Charges:    $Gait Training: 8-22 mins $Therapeutic Activity: 8-22 mins PT General Charges $$ ACUTE PT VISIT: 1 Visit                     Johny Shock, PTA Acute Rehabilitation Services Secure Chat Preferred  [phone]    Johny Shock 07/28/2023, 11:10 AM

## 2023-07-28 NOTE — Plan of Care (Signed)
  Problem: Education: Goal: Ability to describe self-care measures that may prevent or decrease complications (Diabetes Survival Skills Education) will improve Outcome: Adequate for Discharge Goal: Individualized Educational Video(s) Outcome: Adequate for Discharge   Problem: Coping: Goal: Ability to adjust to condition or change in health will improve Outcome: Adequate for Discharge   Problem: Fluid Volume: Goal: Ability to maintain a balanced intake and output will improve Outcome: Adequate for Discharge   Problem: Health Behavior/Discharge Planning: Goal: Ability to identify and utilize available resources and services will improve Outcome: Adequate for Discharge Goal: Ability to manage health-related needs will improve Outcome: Adequate for Discharge   Problem: Metabolic: Goal: Ability to maintain appropriate glucose levels will improve Outcome: Adequate for Discharge   Problem: Nutritional: Goal: Maintenance of adequate nutrition will improve Outcome: Adequate for Discharge Goal: Progress toward achieving an optimal weight will improve Outcome: Adequate for Discharge   Problem: Skin Integrity: Goal: Risk for impaired skin integrity will decrease Outcome: Adequate for Discharge   Problem: Tissue Perfusion: Goal: Adequacy of tissue perfusion will improve Outcome: Adequate for Discharge   Problem: Acute Rehab PT Goals(only PT should resolve) Goal: Pt Will Go Supine/Side To Sit Outcome: Adequate for Discharge Goal: Pt Will Go Sit To Supine/Side Outcome: Adequate for Discharge Goal: Pt Will Transfer Bed To Chair/Chair To Bed Outcome: Adequate for Discharge Goal: Pt Will Ambulate Outcome: Adequate for Discharge Goal: Pt/caregiver will Perform Home Exercise Program Outcome: Adequate for Discharge   Problem: Acute Rehab OT Goals (only OT should resolve) Goal: Pt. Will Perform Grooming Outcome: Adequate for Discharge Goal: Pt. Will Perform Lower Body Bathing Outcome:  Adequate for Discharge Goal: Pt. Will Perform Lower Body Dressing Outcome: Adequate for Discharge Goal: Pt. Will Transfer To Toilet Outcome: Adequate for Discharge   Problem: Education: Goal: Knowledge of General Education information will improve Description: Including pain rating scale, medication(s)/side effects and non-pharmacologic comfort measures Outcome: Adequate for Discharge   Problem: Health Behavior/Discharge Planning: Goal: Ability to manage health-related needs will improve Outcome: Adequate for Discharge   Problem: Clinical Measurements: Goal: Ability to maintain clinical measurements within normal limits will improve Outcome: Adequate for Discharge Goal: Will remain free from infection Outcome: Adequate for Discharge Goal: Diagnostic test results will improve Outcome: Adequate for Discharge Goal: Respiratory complications will improve Outcome: Adequate for Discharge Goal: Cardiovascular complication will be avoided Outcome: Adequate for Discharge   Problem: Activity: Goal: Risk for activity intolerance will decrease Outcome: Adequate for Discharge   Problem: Nutrition: Goal: Adequate nutrition will be maintained Outcome: Adequate for Discharge   Problem: Coping: Goal: Level of anxiety will decrease Outcome: Adequate for Discharge   Problem: Elimination: Goal: Will not experience complications related to bowel motility Outcome: Adequate for Discharge Goal: Will not experience complications related to urinary retention Outcome: Adequate for Discharge   Problem: Pain Managment: Goal: General experience of comfort will improve Outcome: Adequate for Discharge   Problem: Safety: Goal: Ability to remain free from injury will improve Outcome: Adequate for Discharge   Problem: Skin Integrity: Goal: Risk for impaired skin integrity will decrease Outcome: Adequate for Discharge

## 2023-07-30 ENCOUNTER — Telehealth: Payer: Self-pay

## 2023-07-30 ENCOUNTER — Encounter: Payer: Self-pay | Admitting: Pharmacist

## 2023-07-30 DIAGNOSIS — Z794 Long term (current) use of insulin: Secondary | ICD-10-CM | POA: Diagnosis not present

## 2023-07-30 DIAGNOSIS — I081 Rheumatic disorders of both mitral and tricuspid valves: Secondary | ICD-10-CM | POA: Diagnosis not present

## 2023-07-30 DIAGNOSIS — I5043 Acute on chronic combined systolic (congestive) and diastolic (congestive) heart failure: Secondary | ICD-10-CM | POA: Diagnosis not present

## 2023-07-30 DIAGNOSIS — B9689 Other specified bacterial agents as the cause of diseases classified elsewhere: Secondary | ICD-10-CM | POA: Diagnosis not present

## 2023-07-30 DIAGNOSIS — J441 Chronic obstructive pulmonary disease with (acute) exacerbation: Secondary | ICD-10-CM | POA: Diagnosis not present

## 2023-07-30 DIAGNOSIS — Z9981 Dependence on supplemental oxygen: Secondary | ICD-10-CM | POA: Diagnosis not present

## 2023-07-30 DIAGNOSIS — D649 Anemia, unspecified: Secondary | ICD-10-CM | POA: Diagnosis not present

## 2023-07-30 DIAGNOSIS — J440 Chronic obstructive pulmonary disease with acute lower respiratory infection: Secondary | ICD-10-CM | POA: Diagnosis not present

## 2023-07-30 DIAGNOSIS — I480 Paroxysmal atrial fibrillation: Secondary | ICD-10-CM | POA: Diagnosis not present

## 2023-07-30 DIAGNOSIS — S3092XD Unspecified superficial injury of abdominal wall, subsequent encounter: Secondary | ICD-10-CM | POA: Diagnosis not present

## 2023-07-30 DIAGNOSIS — I2510 Atherosclerotic heart disease of native coronary artery without angina pectoris: Secondary | ICD-10-CM | POA: Diagnosis not present

## 2023-07-30 DIAGNOSIS — I110 Hypertensive heart disease with heart failure: Secondary | ICD-10-CM | POA: Diagnosis not present

## 2023-07-30 DIAGNOSIS — Z9181 History of falling: Secondary | ICD-10-CM | POA: Diagnosis not present

## 2023-07-30 DIAGNOSIS — E119 Type 2 diabetes mellitus without complications: Secondary | ICD-10-CM | POA: Diagnosis not present

## 2023-07-30 DIAGNOSIS — Z7985 Long-term (current) use of injectable non-insulin antidiabetic drugs: Secondary | ICD-10-CM | POA: Diagnosis not present

## 2023-07-30 DIAGNOSIS — Z7951 Long term (current) use of inhaled steroids: Secondary | ICD-10-CM | POA: Diagnosis not present

## 2023-07-30 DIAGNOSIS — E874 Mixed disorder of acid-base balance: Secondary | ICD-10-CM | POA: Diagnosis not present

## 2023-07-30 DIAGNOSIS — E785 Hyperlipidemia, unspecified: Secondary | ICD-10-CM | POA: Diagnosis not present

## 2023-07-30 DIAGNOSIS — I3139 Other pericardial effusion (noninflammatory): Secondary | ICD-10-CM | POA: Diagnosis not present

## 2023-07-30 DIAGNOSIS — J9811 Atelectasis: Secondary | ICD-10-CM | POA: Diagnosis not present

## 2023-07-30 DIAGNOSIS — J9621 Acute and chronic respiratory failure with hypoxia: Secondary | ICD-10-CM | POA: Diagnosis not present

## 2023-07-30 DIAGNOSIS — Z7901 Long term (current) use of anticoagulants: Secondary | ICD-10-CM | POA: Diagnosis not present

## 2023-07-30 DIAGNOSIS — Z7952 Long term (current) use of systemic steroids: Secondary | ICD-10-CM | POA: Diagnosis not present

## 2023-07-30 NOTE — Transitions of Care (Post Inpatient/ED Visit) (Signed)
07/30/2023  Name: Alicia [name] MRN: [id number] DOB: 08-26-1956  Today's TOC FU Call Status: Today's TOC FU Call Status:: Successful TOC FU Call Completed TOC FU Call Complete Date: 07/30/23 Patient's Name and Date of Birth confirmed.  Transition Care Management Follow-up Telephone Call Date of Discharge: 07/28/23 Discharge Facility: [facility]) Type of Discharge: Inpatient Admission Primary Inpatient Discharge Diagnosis:: Acute Respiratory Failure with Hypoxia- Covid-19 How have you been since you were released from the hospital?: Better (Patient notes she is feeling better.  She has had some issues with her oxygen but it has resolved.) Any questions or concerns?: No  Items Reviewed: Did you receive and understand the discharge instructions provided?: Yes Medications obtained,verified, and reconciled?: Yes (Medications Reviewed) Any new allergies since your discharge?: No Dietary orders reviewed?: No Do you have support at home?: Yes People in Home: grandchild(ren) Name of Support/Comfort Primary Source: Patients grand-daughter is heloing her  Medications Reviewed Today: Medications Reviewed Today     Reviewed by Jodelle Gross, RN (Case Manager) on 07/30/23 at 1107  Med List Status: <None>   Medication Order Taking? Sig Documenting Provider Last Dose Status Informant  ACCU-CHEK AVIVA PLUS test strip [id number] Yes USE TO TEST BLOOD SUGARS AS DIRECTED  Patient taking differently: 1 each by Other route See admin instructions. USE TO TEST BLOOD SUGARS AS DIRECTED   [name], MD Taking Active Self  amLODipine (NORVASC) 10 MG tablet [id number] Yes TAKE 1 TABLET BY MOUTH EVERY DAY [name], MD Taking Active Self  amoxicillin-clavulanate (AUGMENTIN) 875-125 MG tablet [id number] Yes Take 1 tablet by mouth 2 (two) times daily for 2 days. Marrianne Mood, MD Taking Active   apixaban (ELIQUIS) 5 MG TABS tablet [id number] Yes Take 1 tablet (5 mg total) by mouth 2  (two) times daily. Morene Crocker, MD Taking Active Self  atorvastatin (LIPITOR) 80 MG tablet [id number] Yes TAKE 1 TABLET BY MOUTH EVERY DAY Jake Bathe, MD Taking Active Self  B-D UF III MINI PEN NEEDLES 31G X 5 MM MISC [id number] Yes USE 3 TIMES A DAY WITH HUMALOG [name], MD Taking Active Self  carbamide peroxide (DEBROX) 6.5 % OTIC solution [id number] Yes Place 5 drops into both ears 2 (two) times daily. Marrianne Mood, MD Taking Active   Dulaglutide (TRULICITY) 1.5 MG/0.5ML Namon Cirri [id number] Yes Inject 1.5 mg into the skin once a week. [name], MD Taking Active Self           Med Note Kandis Cocking Alinda Dooms A   Tue Jul 20, 2023  5:26 PM) Pt needs refill.  ezetimibe (ZETIA) 10 MG tablet [id number] Yes TAKE 1 TABLET BY MOUTH EVERY DAY Swinyer, Zachary George, NP Taking Active   Fluticasone-Umeclidin-Vilant (TRELEGY ELLIPTA) 200-62.5-25 MCG/ACT AEPB [id number] Yes Inhale 1 puff into the lungs daily. Glenford Bayley, NP Taking Active Self  furosemide (LASIX) 40 MG tablet [id number] Yes TAKE 2 TABLETS BY MOUTH EVERY DAY [name], MD Taking Active   Glucose Blood (BLOOD GLUCOSE TEST STRIPS) STRP [id number] Yes Use as directed to check blood sugar 3 (three) times daily. Marrianne Mood, MD Taking Active   guaiFENesin-dextromethorphan [facility] DM) 100-10 MG/5ML syrup [id number] Yes Take 10 mLs by mouth at bedtime. Modena Slater, DO Taking Active Self  hydrochlorothiazide (HYDRODIURIL) 25 MG tablet [id number] Yes TAKE 1 TABLET (25 MG TOTAL) BY MOUTH DAILY. [name], MD Taking Active   insulin glargine (LANTUS) 100 UNIT/ML Solostar Pen [id number] Yes Inject 30 Units into the  skin daily. Jeral Pinch, DO Taking Active Self           Med Note Kandis Cocking Alinda Dooms A   Tue Jul 20, 2023  5:28 PM) Pt states that she doesn't do 30 units bc it drops her glucose too much. Pt changes units depending on her glucose levels.  insulin lispro  (HUMALOG) 100 UNIT/ML injection [id number] Yes Use as directed Marrianne Mood, MD Taking Active   insulin lispro (HUMALOG) 100 UNIT/ML KwikPen [id number] Yes Inject 14 Units into the skin 2 (two) times daily with breakfast and lunch AND 20 Units daily before supper. Only take if eating a meal AND Blood Glucose (BG) is 80 or higher.Marrianne Mood, MD Taking Active   Insulin Pen Needle 31G X 8 MM MISC [id number] Yes Use 3 (three) times daily. Marrianne Mood, MD Taking Active   Insulin Pen Needle 32G X 4 MM MISC [id number] Yes Use to inject insulin 4 times a day. The patient is insulin requiring, ICD 10 code 11.10. The patient injects 4 times per day. Doneen Poisson, MD Taking Active Self  Insulin Syringe-Needle U-100 31G X 15/64" 0.3 ML MISC [id number] Yes Use to inject Humalog before meals three times a day [name], MD Taking Active Self  Lancet Device MISC [id number] Yes Use 3 (three) times daily. Marrianne Mood, MD Taking Active   Lancets (ACCU-CHEK MULTICLIX) lancets [id number] Yes Use to check your blood sugar four times daily: early morning, before a meal, two hours after a meal, and bedtime [name], MD Taking Active Self  Lancets MISC [id number] Yes Use as directed to check blood sugar 3 (three) times daily. Marrianne Mood, MD Taking Active   predniSONE (DELTASONE) 10 MG tablet [id number] Yes Take 1 tablet (10 mg total) by mouth daily for 1 day. Marrianne Mood, MD Taking Active   sacubitril-valsartan [facility]) 24-26 MG [id number]  Take 1 tablet by mouth 2 (two) times daily. Tawkaliyar, Roya, DO  Expired 07/28/23 2359 Self  spironolactone (ALDACTONE) 25 MG tablet [id number] Yes Take 12.5 mg by mouth daily. [provider] Taking Active Self  torsemide (DEMADEX) 20 MG tablet [id number] Yes Take 2 tablets (40 mg total) by mouth every morning AND 1 tablet (20 mg total) every evening. Clegg, Amy D, NP Taking Active Self  Vitamin D, Ergocalciferol,  (DRISDOL) 1.25 MG (50000 UNIT) CAPS capsule [id number] Yes Take 1 capsule (50,000 Units total) by mouth every 7 (seven) days. Marrianne Mood, MD Taking Active             Home Care and Equipment/Supplies: Were Home Health Services Ordered?: Yes [facility] of [facility] Agency[facility] Has Agency set up a time to come to your home?: Yes First Home Health Visit Date: 07/31/23 (Restarting services over weekend) Any new equipment or medical supplies ordered?: No  Functional Questionnaire: Do you need assistance with bathing/showering or dressing?: Yes Do you need assistance with meal preparation?: Yes Do you need assistance with eating?: No Do you have difficulty maintaining continence: No Do you need assistance with getting out of bed/getting out of a chair/moving?: No Do you have difficulty managing or taking your medications?: No  Follow up appointments reviewed: PCP Follow-up appointment confirmed?: Yes Date of PCP follow-up appointment?: 08/09/23 Follow-up Provider: Dr. Oswaldo Done [facility] Follow-up appointment confirmed?: Yes Date of Specialist follow-up appointment?: 08/05/23 Follow-Up Specialty Provider:: [facility] Do you need transportation to your follow-up appointment?: No Do you understand care options if your condition(s) worsen?: Yes-patient verbalized understanding  SDOH Interventions Today    Flowsheet Row Most Recent Value  SDOH Interventions   Food Insecurity Interventions Intervention Not Indicated  Housing Interventions Intervention Not Indicated  Transportation Interventions Intervention Not Indicated  Utilities Interventions Intervention Not Indicated      TOC Interventions Today    Flowsheet Row Most Recent Value  TOC Interventions   TOC Interventions Discussed/Reviewed TOC Interventions Discussed, TOC Interventions Reviewed  [Updated RNCC [name] of patient status]       Jodelle Gross RN, BSN, CCM [facility] RN Care Coordinator/ Transitions of Care Direct Dial: [phone]  Fax: [phone]

## 2023-08-02 ENCOUNTER — Ambulatory Visit (HOSPITAL_COMMUNITY): Payer: 59

## 2023-08-02 DIAGNOSIS — B9689 Other specified bacterial agents as the cause of diseases classified elsewhere: Secondary | ICD-10-CM | POA: Diagnosis not present

## 2023-08-02 DIAGNOSIS — E785 Hyperlipidemia, unspecified: Secondary | ICD-10-CM | POA: Diagnosis not present

## 2023-08-02 DIAGNOSIS — I081 Rheumatic disorders of both mitral and tricuspid valves: Secondary | ICD-10-CM | POA: Diagnosis not present

## 2023-08-02 DIAGNOSIS — Z7901 Long term (current) use of anticoagulants: Secondary | ICD-10-CM | POA: Diagnosis not present

## 2023-08-02 DIAGNOSIS — J441 Chronic obstructive pulmonary disease with (acute) exacerbation: Secondary | ICD-10-CM | POA: Diagnosis not present

## 2023-08-02 DIAGNOSIS — S3092XD Unspecified superficial injury of abdominal wall, subsequent encounter: Secondary | ICD-10-CM | POA: Diagnosis not present

## 2023-08-02 DIAGNOSIS — J9811 Atelectasis: Secondary | ICD-10-CM | POA: Diagnosis not present

## 2023-08-02 DIAGNOSIS — I3139 Other pericardial effusion (noninflammatory): Secondary | ICD-10-CM | POA: Diagnosis not present

## 2023-08-02 DIAGNOSIS — Z9981 Dependence on supplemental oxygen: Secondary | ICD-10-CM | POA: Diagnosis not present

## 2023-08-02 DIAGNOSIS — E874 Mixed disorder of acid-base balance: Secondary | ICD-10-CM | POA: Diagnosis not present

## 2023-08-02 DIAGNOSIS — I480 Paroxysmal atrial fibrillation: Secondary | ICD-10-CM | POA: Diagnosis not present

## 2023-08-02 DIAGNOSIS — I2510 Atherosclerotic heart disease of native coronary artery without angina pectoris: Secondary | ICD-10-CM | POA: Diagnosis not present

## 2023-08-02 DIAGNOSIS — I110 Hypertensive heart disease with heart failure: Secondary | ICD-10-CM | POA: Diagnosis not present

## 2023-08-02 DIAGNOSIS — Z7952 Long term (current) use of systemic steroids: Secondary | ICD-10-CM | POA: Diagnosis not present

## 2023-08-02 DIAGNOSIS — J9621 Acute and chronic respiratory failure with hypoxia: Secondary | ICD-10-CM | POA: Diagnosis not present

## 2023-08-02 DIAGNOSIS — D649 Anemia, unspecified: Secondary | ICD-10-CM | POA: Diagnosis not present

## 2023-08-02 DIAGNOSIS — Z7985 Long-term (current) use of injectable non-insulin antidiabetic drugs: Secondary | ICD-10-CM | POA: Diagnosis not present

## 2023-08-02 DIAGNOSIS — Z794 Long term (current) use of insulin: Secondary | ICD-10-CM | POA: Diagnosis not present

## 2023-08-02 DIAGNOSIS — Z9181 History of falling: Secondary | ICD-10-CM | POA: Diagnosis not present

## 2023-08-02 DIAGNOSIS — I5043 Acute on chronic combined systolic (congestive) and diastolic (congestive) heart failure: Secondary | ICD-10-CM | POA: Diagnosis not present

## 2023-08-02 DIAGNOSIS — Z7951 Long term (current) use of inhaled steroids: Secondary | ICD-10-CM | POA: Diagnosis not present

## 2023-08-02 DIAGNOSIS — J440 Chronic obstructive pulmonary disease with acute lower respiratory infection: Secondary | ICD-10-CM | POA: Diagnosis not present

## 2023-08-02 DIAGNOSIS — E119 Type 2 diabetes mellitus without complications: Secondary | ICD-10-CM | POA: Diagnosis not present

## 2023-08-04 ENCOUNTER — Ambulatory Visit: Payer: Self-pay

## 2023-08-04 NOTE — Patient Instructions (Signed)
Visit Information  Thank you for taking time to visit with me today. Please don't hesitate to contact me if I can be of assistance to you.   Following are the goals we discussed today:   Goals Addressed             This Visit's Progress    Maintain, Monitor and Self-Manage Symptoms of COPD       Patient Goals/Self Care Activities: -Patient/Caregiver will take medications as prescribed   -Patient/Caregiver will attend all scheduled provider appointments -Patient/Caregiver will call pharmacy for medication refills 3-7 days in advance of running out of medications -Patient/Caregiver will call provider office for new concerns or questions  -Patient/Caregiver will focus on medication adherence by taking medications as prescribed  -Keep your airway clear from mucus build up  -Self assess COPD action plan zone and make appointment with provider if you have been in the yellow zone for 48 hours without improvement. -Utilize infection prevention strategies to reduce risk of respiratory infection  -continue to monitor your o2sats -have the company check your oxygen when it is deliver to make sure you don't have any leaks.           Our next appointment is by telephone on 08/27/23 at 1130 am  Please call the care guide team at [phone] if you need to cancel or reschedule your appointment.   If you are experiencing a Mental Health or Behavioral Health Crisis or need someone to talk to, please call 1-800-273-TALK (toll free, 24 hour hotline)  Patient verbalizes understanding of instructions and care plan provided today and agrees to view in MyChart. Active MyChart status and patient understanding of how to access instructions and care plan via MyChart confirmed with patient.     [name] RN, BSN, [facility] [employer] [employer] Phone: [phone]

## 2023-08-04 NOTE — Patient Outreach (Signed)
  Care Coordination   Follow Up Visit Note   08/04/2023 Name: Alicia [name] MRN: [id number] DOB: 08-Mar-1956  Alicia [name] is a 67 y.o. year old female who sees Oswaldo Done, Marquita Palms, MD for primary care. I spoke with  Londin Bushnell Rajkumar by phone today.  What matters to the patients health and wellness today?  Alicia [name] has indicated that her oxygen levels remain adequate when seated, but decline upon standing. Additional oxygen will be provided today, and I have recommended an inspection of the tanks and regulators to ensure proper air supply without leaks. She has an upcoming cardiology appointment tomorrow, for which a family member will provide transportation for safety reasons.    Goals Addressed             This Visit's Progress    Maintain, Monitor and Self-Manage Symptoms of COPD       Patient Goals/Self Care Activities: -Patient/Caregiver will take medications as prescribed   -Patient/Caregiver will attend all scheduled provider appointments -Patient/Caregiver will call pharmacy for medication refills 3-7 days in advance of running out of medications -Patient/Caregiver will call provider office for new concerns or questions  -Patient/Caregiver will focus on medication adherence by taking medications as prescribed  -Keep your airway clear from mucus build up  -Self assess COPD action plan zone and make appointment with provider if you have been in the yellow zone for 48 hours without improvement. -Utilize infection prevention strategies to reduce risk of respiratory infection  -continue to monitor your o2sats -have the company check your oxygen when it is deliver to make sure you don't have any leaks.           SDOH assessments and interventions completed:  No     Care Coordination Interventions:  Yes, provided   Interventions Today    Flowsheet Row Most Recent Value  Chronic Disease   Chronic disease during today's visit Chronic Obstructive Pulmonary Disease  (COPD), Other  [Covid- 19]  General Interventions   General Interventions Discussed/Reviewed General Interventions Discussed  [Advisde the patient to make sure that her oxygen does  not have any leaks, continiue to waer her mask out and if people come to her home.]  Exercise Interventions   Exercise Discussed/Reviewed Physical Activity  Physical Activity Discussed/Reviewed Physical Activity Discussed  [follow physical therapy to bulid your strength]  Nutrition Interventions   Nutrition Discussed/Reviewed Nutrition Discussed  [Always eeat a well balanced meal to build your strenght and help the body to heal]  Pharmacy Interventions   Pharmacy Dicussed/Reviewed Pharmacy Topics Discussed  [take medications as prescribed]  Safety Interventions   Safety Discussed/Reviewed Safety Discussed  [Be careful when standing and walking to prevent falls. always chek your oxygen saturations.]        Follow up plan: Follow up call scheduled for 08/27/23  1130    Encounter Outcome:  Patient Visit Completed   [name] RN, BSN, [facility]   Care Coordinator Phone: [phone]

## 2023-08-05 ENCOUNTER — Ambulatory Visit (HOSPITAL_COMMUNITY): Payer: 59

## 2023-08-06 ENCOUNTER — Encounter (HOSPITAL_COMMUNITY): Payer: Self-pay

## 2023-08-06 ENCOUNTER — Emergency Department (HOSPITAL_COMMUNITY): Payer: 59

## 2023-08-06 ENCOUNTER — Other Ambulatory Visit: Payer: Self-pay | Admitting: Student in an Organized Health Care Education/Training Program

## 2023-08-06 ENCOUNTER — Inpatient Hospital Stay (HOSPITAL_COMMUNITY)
Admission: EM | Admit: 2023-08-06 | Discharge: 2023-08-17 | DRG: 291 | Disposition: A | Discharge status: Home Health | Payer: 59 | Attending: Internal Medicine | Admitting: Internal Medicine

## 2023-08-06 DIAGNOSIS — Z9981 Dependence on supplemental oxygen: Secondary | ICD-10-CM | POA: Diagnosis not present

## 2023-08-06 DIAGNOSIS — I5043 Acute on chronic combined systolic (congestive) and diastolic (congestive) heart failure: Secondary | ICD-10-CM | POA: Diagnosis present

## 2023-08-06 DIAGNOSIS — I5033 Acute on chronic diastolic (congestive) heart failure: Secondary | ICD-10-CM

## 2023-08-06 DIAGNOSIS — M7989 Other specified soft tissue disorders: Secondary | ICD-10-CM

## 2023-08-06 DIAGNOSIS — I2510 Atherosclerotic heart disease of native coronary artery without angina pectoris: Secondary | ICD-10-CM | POA: Diagnosis present

## 2023-08-06 DIAGNOSIS — I5032 Chronic diastolic (congestive) heart failure: Secondary | ICD-10-CM | POA: Diagnosis not present

## 2023-08-06 DIAGNOSIS — J441 Chronic obstructive pulmonary disease with (acute) exacerbation: Secondary | ICD-10-CM | POA: Diagnosis not present

## 2023-08-06 DIAGNOSIS — R1111 Vomiting without nausea: Secondary | ICD-10-CM | POA: Diagnosis not present

## 2023-08-06 DIAGNOSIS — Z1152 Encounter for screening for COVID-19: Secondary | ICD-10-CM | POA: Diagnosis not present

## 2023-08-06 DIAGNOSIS — Z743 Need for continuous supervision: Secondary | ICD-10-CM | POA: Diagnosis not present

## 2023-08-06 DIAGNOSIS — E871 Hypo-osmolality and hyponatremia: Secondary | ICD-10-CM | POA: Diagnosis present

## 2023-08-06 DIAGNOSIS — R0902 Hypoxemia: Secondary | ICD-10-CM | POA: Diagnosis not present

## 2023-08-06 DIAGNOSIS — Z8673 Personal history of transient ischemic attack (TIA), and cerebral infarction without residual deficits: Secondary | ICD-10-CM

## 2023-08-06 DIAGNOSIS — J9621 Acute and chronic respiratory failure with hypoxia: Secondary | ICD-10-CM | POA: Diagnosis not present

## 2023-08-06 DIAGNOSIS — E1165 Type 2 diabetes mellitus with hyperglycemia: Secondary | ICD-10-CM | POA: Diagnosis not present

## 2023-08-06 DIAGNOSIS — I495 Sick sinus syndrome: Secondary | ICD-10-CM | POA: Diagnosis present

## 2023-08-06 DIAGNOSIS — Z7951 Long term (current) use of inhaled steroids: Secondary | ICD-10-CM | POA: Diagnosis not present

## 2023-08-06 DIAGNOSIS — E113399 Type 2 diabetes mellitus with moderate nonproliferative diabetic retinopathy without macular edema, unspecified eye: Secondary | ICD-10-CM | POA: Diagnosis not present

## 2023-08-06 DIAGNOSIS — I110 Hypertensive heart disease with heart failure: Principal | ICD-10-CM | POA: Diagnosis present

## 2023-08-06 DIAGNOSIS — E113393 Type 2 diabetes mellitus with moderate nonproliferative diabetic retinopathy without macular edema, bilateral: Secondary | ICD-10-CM

## 2023-08-06 DIAGNOSIS — I2723 Pulmonary hypertension due to lung diseases and hypoxia: Secondary | ICD-10-CM | POA: Diagnosis not present

## 2023-08-06 DIAGNOSIS — Z6833 Body mass index (BMI) 33.0-33.9, adult: Secondary | ICD-10-CM

## 2023-08-06 DIAGNOSIS — I509 Heart failure, unspecified: Secondary | ICD-10-CM

## 2023-08-06 DIAGNOSIS — Z79899 Other long term (current) drug therapy: Secondary | ICD-10-CM | POA: Diagnosis not present

## 2023-08-06 DIAGNOSIS — Z888 Allergy status to other drugs, medicaments and biological substances status: Secondary | ICD-10-CM | POA: Diagnosis not present

## 2023-08-06 DIAGNOSIS — Z794 Long term (current) use of insulin: Secondary | ICD-10-CM | POA: Diagnosis not present

## 2023-08-06 DIAGNOSIS — Z7901 Long term (current) use of anticoagulants: Secondary | ICD-10-CM | POA: Diagnosis not present

## 2023-08-06 DIAGNOSIS — I700 Atherosclerosis of aorta: Secondary | ICD-10-CM | POA: Diagnosis present

## 2023-08-06 DIAGNOSIS — Z7985 Long-term (current) use of injectable non-insulin antidiabetic drugs: Secondary | ICD-10-CM

## 2023-08-06 DIAGNOSIS — I2489 Other forms of acute ischemic heart disease: Secondary | ICD-10-CM | POA: Insufficient documentation

## 2023-08-06 DIAGNOSIS — Z8701 Personal history of pneumonia (recurrent): Secondary | ICD-10-CM

## 2023-08-06 DIAGNOSIS — Z8616 Personal history of COVID-19: Secondary | ICD-10-CM | POA: Diagnosis not present

## 2023-08-06 DIAGNOSIS — E11649 Type 2 diabetes mellitus with hypoglycemia without coma: Secondary | ICD-10-CM | POA: Diagnosis not present

## 2023-08-06 DIAGNOSIS — J9600 Acute respiratory failure, unspecified whether with hypoxia or hypercapnia: Secondary | ICD-10-CM

## 2023-08-06 DIAGNOSIS — J90 Pleural effusion, not elsewhere classified: Secondary | ICD-10-CM | POA: Diagnosis not present

## 2023-08-06 DIAGNOSIS — U071 COVID-19: Secondary | ICD-10-CM | POA: Diagnosis present

## 2023-08-06 DIAGNOSIS — I480 Paroxysmal atrial fibrillation: Secondary | ICD-10-CM | POA: Diagnosis present

## 2023-08-06 DIAGNOSIS — I5030 Unspecified diastolic (congestive) heart failure: Secondary | ICD-10-CM | POA: Diagnosis not present

## 2023-08-06 DIAGNOSIS — J449 Chronic obstructive pulmonary disease, unspecified: Secondary | ICD-10-CM | POA: Diagnosis present

## 2023-08-06 DIAGNOSIS — E662 Morbid (severe) obesity with alveolar hypoventilation: Secondary | ICD-10-CM | POA: Diagnosis present

## 2023-08-06 DIAGNOSIS — I517 Cardiomegaly: Secondary | ICD-10-CM | POA: Diagnosis not present

## 2023-08-06 DIAGNOSIS — R6889 Other general symptoms and signs: Secondary | ICD-10-CM | POA: Diagnosis not present

## 2023-08-06 DIAGNOSIS — R0602 Shortness of breath: Secondary | ICD-10-CM | POA: Diagnosis not present

## 2023-08-06 DIAGNOSIS — R918 Other nonspecific abnormal finding of lung field: Secondary | ICD-10-CM | POA: Diagnosis not present

## 2023-08-06 LAB — CBC WITH DIFFERENTIAL/PLATELET
Abs Immature Granulocytes: 0.17 10*3/uL — ABNORMAL HIGH (ref 0.00–0.07)
Basophils Absolute: 0 10*3/uL (ref 0.0–0.1)
Basophils Relative: 0 %
Eosinophils Absolute: 0 10*3/uL (ref 0.0–0.5)
Eosinophils Relative: 0 %
HCT: 28.1 % — ABNORMAL LOW (ref 36.0–46.0)
Hemoglobin: 8.6 g/dL — ABNORMAL LOW (ref 12.0–15.0)
Immature Granulocytes: 1 %
Lymphocytes Relative: 4 %
Lymphs Abs: 0.6 10*3/uL — ABNORMAL LOW (ref 0.7–4.0)
MCH: 26.8 pg (ref 26.0–34.0)
MCHC: 30.6 g/dL (ref 30.0–36.0)
MCV: 87.5 fL (ref 80.0–100.0)
Monocytes Absolute: 1 10*3/uL (ref 0.1–1.0)
Monocytes Relative: 7 %
Neutro Abs: 11.9 10*3/uL — ABNORMAL HIGH (ref 1.7–7.7)
Neutrophils Relative %: 88 %
Platelets: 359 10*3/uL (ref 150–400)
RBC: 3.21 MIL/uL — ABNORMAL LOW (ref 3.87–5.11)
RDW: 18.8 % — ABNORMAL HIGH (ref 11.5–15.5)
WBC: 13.6 10*3/uL — ABNORMAL HIGH (ref 4.0–10.5)
nRBC: 3.7 % — ABNORMAL HIGH (ref 0.0–0.2)

## 2023-08-06 LAB — I-STAT VENOUS BLOOD GAS, ED
Acid-Base Excess: 8 mmol/L — ABNORMAL HIGH (ref 0.0–2.0)
Bicarbonate: 30.6 mmol/L — ABNORMAL HIGH (ref 20.0–28.0)
Calcium, Ion: 0.88 mmol/L — CL (ref 1.15–1.40)
HCT: 27 % — ABNORMAL LOW (ref 36.0–46.0)
Hemoglobin: 9.2 g/dL — ABNORMAL LOW (ref 12.0–15.0)
O2 Saturation: 76 %
Potassium: 3.7 mmol/L (ref 3.5–5.1)
Sodium: 132 mmol/L — ABNORMAL LOW (ref 135–145)
TCO2: 32 mmol/L (ref 22–32)
pCO2, Ven: 35.6 mmHg — ABNORMAL LOW (ref 44–60)
pH, Ven: 7.543 — ABNORMAL HIGH (ref 7.25–7.43)
pO2, Ven: 36 mmHg (ref 32–45)

## 2023-08-06 LAB — BASIC METABOLIC PANEL
Anion gap: 14 (ref 5–15)
BUN: 19 mg/dL (ref 8–23)
CO2: 28 mmol/L (ref 22–32)
Calcium: 8 mg/dL — ABNORMAL LOW (ref 8.9–10.3)
Chloride: 91 mmol/L — ABNORMAL LOW (ref 98–111)
Creatinine, Ser: 1.33 mg/dL — ABNORMAL HIGH (ref 0.44–1.00)
GFR, Estimated: 44 mL/min — ABNORMAL LOW (ref >60)
Glucose, Bld: 174 mg/dL — ABNORMAL HIGH (ref 70–99)
Potassium: 3.7 mmol/L (ref 3.5–5.1)
Sodium: 133 mmol/L — ABNORMAL LOW (ref 135–145)

## 2023-08-06 LAB — PATHOLOGIST SMEAR REVIEW

## 2023-08-06 LAB — POC OCCULT BLOOD, ED: Fecal Occult Bld: NEGATIVE

## 2023-08-06 LAB — TROPONIN I (HIGH SENSITIVITY)
Troponin I (High Sensitivity): 26 ng/L — ABNORMAL HIGH (ref <18)
Troponin I (High Sensitivity): 101 ng/L (ref <18)
Troponin I (High Sensitivity): 113 ng/L (ref <18)
Troponin I (High Sensitivity): 120 ng/L (ref <18)

## 2023-08-06 LAB — GLUCOSE, CAPILLARY
Glucose-Capillary: 310 mg/dL — ABNORMAL HIGH (ref 70–99)
Glucose-Capillary: 361 mg/dL — ABNORMAL HIGH (ref 70–99)

## 2023-08-06 LAB — MAGNESIUM: Magnesium: 1.2 mg/dL — ABNORMAL LOW (ref 1.7–2.4)

## 2023-08-06 LAB — BRAIN NATRIURETIC PEPTIDE: B Natriuretic Peptide: 320.4 pg/mL — ABNORMAL HIGH (ref 0.0–100.0)

## 2023-08-06 LAB — SARS CORONAVIRUS 2 BY RT PCR: SARS Coronavirus 2 by RT PCR: POSITIVE — AB

## 2023-08-06 MED ORDER — GUAIFENESIN-DM 100-10 MG/5ML PO SYRP
10.0000 mL | ORAL_SOLUTION | Freq: Every day | ORAL | Status: DC
  Administered 2023-08-06: 10 mL via ORAL
  Filled 2023-08-06: qty 10

## 2023-08-06 MED ORDER — INSULIN GLARGINE-YFGN 100 UNIT/ML ~~LOC~~ SOLN
40.0000 [IU] | Freq: Every day | SUBCUTANEOUS | Status: DC
  Administered 2023-08-06 – 2023-08-09 (×4): 40 [IU] via SUBCUTANEOUS
  Filled 2023-08-06 (×5): qty 0.4

## 2023-08-06 MED ORDER — FLUTICASONE FUROATE-VILANTEROL 200-25 MCG/ACT IN AEPB: 1.0000 | INHALATION_SPRAY | Freq: Every day | RESPIRATORY_TRACT | Status: DC

## 2023-08-06 MED ORDER — IOHEXOL 350 MG/ML SOLN
75.0000 mL | Freq: Once | INTRAVENOUS | Status: AC | PRN
  Administered 2023-08-06: 75 mL via INTRAVENOUS

## 2023-08-06 MED ORDER — EZETIMIBE 10 MG PO TABS
10.0000 mg | ORAL_TABLET | Freq: Every day | ORAL | Status: DC
  Administered 2023-08-06 – 2023-08-17 (×12): 10 mg via ORAL
  Filled 2023-08-06 (×12): qty 1

## 2023-08-06 MED ORDER — FUROSEMIDE 10 MG/ML IJ SOLN
120.0000 mg | Freq: Three times a day (TID) | INTRAVENOUS | Status: DC
  Administered 2023-08-06 (×2): 120 mg via INTRAVENOUS
  Filled 2023-08-06: qty 12
  Filled 2023-08-06 (×2): qty 10
  Filled 2023-08-06 (×3): qty 12

## 2023-08-06 MED ORDER — APIXABAN 5 MG PO TABS
5.0000 mg | ORAL_TABLET | Freq: Two times a day (BID) | ORAL | Status: DC
  Administered 2023-08-06 – 2023-08-17 (×22): 5 mg via ORAL
  Filled 2023-08-06 (×23): qty 1

## 2023-08-06 MED ORDER — IPRATROPIUM BROMIDE 0.02 % IN SOLN
1.0000 mg | Freq: Once | RESPIRATORY_TRACT | Status: AC
  Administered 2023-08-06: 1 mg via RESPIRATORY_TRACT
  Filled 2023-08-06: qty 5

## 2023-08-06 MED ORDER — INSULIN ASPART 100 UNIT/ML IJ SOLN
0.0000 [IU] | Freq: Three times a day (TID) | INTRAMUSCULAR | Status: DC
  Administered 2023-08-06: 15 [IU] via SUBCUTANEOUS
  Administered 2023-08-07: 20 [IU] via SUBCUTANEOUS
  Administered 2023-08-08: 4 [IU] via SUBCUTANEOUS
  Administered 2023-08-08: 7 [IU] via SUBCUTANEOUS
  Administered 2023-08-09: 3 [IU] via SUBCUTANEOUS
  Administered 2023-08-09: 7 [IU] via SUBCUTANEOUS
  Administered 2023-08-09 – 2023-08-10 (×2): 4 [IU] via SUBCUTANEOUS
  Administered 2023-08-10: 15 [IU] via SUBCUTANEOUS
  Administered 2023-08-11: 4 [IU] via SUBCUTANEOUS
  Administered 2023-08-11: 11 [IU] via SUBCUTANEOUS
  Administered 2023-08-12: 7 [IU] via SUBCUTANEOUS
  Administered 2023-08-12: 11 [IU] via SUBCUTANEOUS
  Administered 2023-08-12: 4 [IU] via SUBCUTANEOUS
  Administered 2023-08-13: 11 [IU] via SUBCUTANEOUS
  Administered 2023-08-13: 7 [IU] via SUBCUTANEOUS
  Administered 2023-08-13 – 2023-08-14 (×2): 4 [IU] via SUBCUTANEOUS
  Administered 2023-08-14 – 2023-08-15 (×2): 7 [IU] via SUBCUTANEOUS
  Administered 2023-08-15: 11 [IU] via SUBCUTANEOUS
  Administered 2023-08-16: 7 [IU] via SUBCUTANEOUS
  Administered 2023-08-16: 15 [IU] via SUBCUTANEOUS
  Administered 2023-08-17: 3 [IU] via SUBCUTANEOUS

## 2023-08-06 MED ORDER — UMECLIDINIUM BROMIDE 62.5 MCG/ACT IN AEPB
1.0000 | INHALATION_SPRAY | Freq: Every day | RESPIRATORY_TRACT | Status: DC
  Filled 2023-08-06: qty 7

## 2023-08-06 MED ORDER — ALBUTEROL SULFATE (2.5 MG/3ML) 0.083% IN NEBU
5.0000 mg | INHALATION_SOLUTION | Freq: Once | RESPIRATORY_TRACT | Status: AC
  Administered 2023-08-06: 5 mg via RESPIRATORY_TRACT
  Filled 2023-08-06: qty 6

## 2023-08-06 MED ORDER — MAGNESIUM SULFATE 2 GM/50ML IV SOLN
2.0000 g | Freq: Once | INTRAVENOUS | Status: AC
  Administered 2023-08-06: 2 g via INTRAVENOUS
  Filled 2023-08-06: qty 50

## 2023-08-06 MED ORDER — ALBUTEROL SULFATE (2.5 MG/3ML) 0.083% IN NEBU: 2.5000 mg | INHALATION_SOLUTION | Freq: Four times a day (QID) | RESPIRATORY_TRACT | Status: DC | PRN

## 2023-08-06 MED ORDER — ATORVASTATIN CALCIUM 80 MG PO TABS
80.0000 mg | ORAL_TABLET | Freq: Every day | ORAL | Status: DC
  Administered 2023-08-06 – 2023-08-17 (×12): 80 mg via ORAL
  Filled 2023-08-06 (×2): qty 1
  Filled 2023-08-06: qty 2
  Filled 2023-08-06 (×9): qty 1

## 2023-08-06 MED ORDER — METHYLPREDNISOLONE SODIUM SUCC 125 MG IJ SOLR
125.0000 mg | INTRAMUSCULAR | Status: AC
  Administered 2023-08-06: 125 mg via INTRAVENOUS
  Filled 2023-08-06: qty 2

## 2023-08-06 MED ORDER — POTASSIUM CHLORIDE CRYS ER 20 MEQ PO TBCR
40.0000 meq | EXTENDED_RELEASE_TABLET | Freq: Once | ORAL | Status: AC
  Administered 2023-08-06: 40 meq via ORAL
  Filled 2023-08-06: qty 2

## 2023-08-06 NOTE — Progress Notes (Signed)
Received at sign out that patient's second troponin increased from 26 to 101. Dr. Geraldo Pitter assessed patient at bedside then. Patient at that time was asymptomatic. Repeat EKG without ischemic changes. Order was placed for repeat set of troponin. Troponin resulted at 113 and 120.   Reassessed patient at bedside. Patient denies any cp, worsening SOB or other pain/discomfort. States feeling better and able to eat her dinner. States had coughing fit which has since resolved and in bed comfortably.   Vitals reassessed and stable. She is satting around 90% on 15L HFNC but does not appear in any respiratory distress. Notes recent coughing fit which prompted the increase to 15L which has resolved. No acute changes noted on physical exam.   Elevated troponin likely in setting of demand ischemia from her initial presentation. Would monitor for symptoms. Plan for repeat echo per day team tomorrow.   Rana Snare, DO Internal Medicine Resident, PGY-2 Please contact the on call pager after 5 pm and on weekends at [phone].

## 2023-08-06 NOTE — ED Provider Notes (Signed)
[place] EMERGENCY DEPARTMENT AT [facility] Provider Note   CSN: [id number] Arrival date & time: 08/06/23  0805     History  Chief Complaint  Patient presents with   Respiratory Distress    Alicia [name] is a 67 y.o. female.  The history is provided by the patient, the EMS personnel and medical records. No language interpreter was used.     67 year old female with significant history of COPD, CHF currently on chronic home O2 around 4 to 5 L/min and, recent pneumonia, diabetes, CAD, on Eliquis brought here via EMS from home for evaluation of shortness of breath.  Per EMS note, family member found patient unresponsive at home with snoring respiration.  When EMS arrived, patient was found to be hypoxic with an O2 sats of 72% on 4 L.  Patient was placed on a nonrebreather and became more oriented and O2 sats improved.  Patient states she overall felt fine she does not know why she is in the hospital.  She did not recall having increased shortness of breath.  She did mention some mild headache several days ago and does endorse some productive cough.  She is complaining of increasing swelling to her left leg but states that she has had leg swelling in the past.  She does not endorse any significant chest pain abdominal pain no nausea vomiting.  Home Medications Prior to Admission medications   Medication Sig Start Date End Date Taking? Authorizing Provider  ACCU-CHEK AVIVA PLUS test strip USE TO TEST BLOOD SUGARS AS DIRECTED Patient taking differently: 1 each by Other route See admin instructions. USE TO TEST BLOOD SUGARS AS DIRECTED 10/26/22   [name], MD  albuterol (PROVENTIL) (2.5 MG/3ML) 0.083% nebulizer solution INHALE 3 ML BY NEBULIZATION EVERY 6 HOURS AS NEEDED FOR WHEEZING OR SHORTNESS OF BREATH 07/30/23   Charlott Holler, MD  amLODipine (NORVASC) 10 MG tablet TAKE 1 TABLET BY MOUTH EVERY DAY 06/11/23   [name], MD  apixaban (ELIQUIS) 5 MG TABS tablet  Take 1 tablet (5 mg total) by mouth 2 (two) times daily. 07/08/23 07/07/24  Morene Crocker, MD  atorvastatin (LIPITOR) 80 MG tablet TAKE 1 TABLET BY MOUTH EVERY DAY 06/09/23   Jake Bathe, MD  B-D UF III MINI PEN NEEDLES 31G X 5 MM MISC USE 3 TIMES A DAY WITH HUMALOG 08/10/22   [name], MD  carbamide peroxide (DEBROX) 6.5 % OTIC solution Place 5 drops into both ears 2 (two) times daily. 07/28/23   Marrianne Mood, MD  Dulaglutide (TRULICITY) 1.5 MG/0.5ML SOPN Inject 1.5 mg into the skin once a week. 05/03/23   [name], MD  ezetimibe (ZETIA) 10 MG tablet TAKE 1 TABLET BY MOUTH EVERY DAY 07/28/23   Swinyer, Zachary George, NP  Fluticasone-Umeclidin-Vilant (TRELEGY ELLIPTA) 200-62.5-25 MCG/ACT AEPB Inhale 1 puff into the lungs daily. 07/14/23   Glenford Bayley, NP  furosemide (LASIX) 40 MG tablet TAKE 2 TABLETS BY MOUTH EVERY DAY 07/28/23   [name], MD  Glucose Blood (BLOOD GLUCOSE TEST STRIPS) STRP Use as directed to check blood sugar 3 (three) times daily. 07/28/23   Marrianne Mood, MD  guaiFENesin-dextromethorphan (ROBITUSSIN DM) 100-10 MG/5ML syrup Take 10 mLs by mouth at bedtime. 06/28/23   Modena Slater, DO  hydrochlorothiazide (HYDRODIURIL) 25 MG tablet TAKE 1 TABLET (25 MG TOTAL) BY MOUTH DAILY. 07/28/23   [name], MD  insulin glargine (LANTUS) 100 UNIT/ML Solostar Pen Inject 30 Units into  the skin daily. 06/28/23   Jeral Pinch, DO  insulin lispro (HUMALOG) 100 UNIT/ML injection Use as directed 07/28/23 07/27/24  Marrianne Mood, MD  insulin lispro (HUMALOG) 100 UNIT/ML KwikPen Inject 14 Units into the skin 2 (two) times daily with breakfast and lunch AND 20 Units daily before supper. Only take if eating a meal AND Blood Glucose (BG) is 80 or higher.. 07/28/23   Marrianne Mood, MD  Insulin Pen Needle 31G X 8 MM MISC Use 3 (three) times daily. 07/28/23   Marrianne Mood, MD  Insulin Pen Needle 32G X 4 MM MISC Use to inject insulin 4 times a  day. The patient is insulin requiring, ICD 10 code 11.10. The patient injects 4 times per day. 04/28/18   Doneen Poisson, MD  Insulin Syringe-Needle U-100 31G X 15/64" 0.3 ML MISC Use to inject Humalog before meals three times a day 05/31/19   [name], MD  Lancet Device MISC Use 3 (three) times daily. 07/28/23   Marrianne Mood, MD  Lancets (ACCU-CHEK MULTICLIX) lancets Use to check your blood sugar four times daily: early morning, before a meal, two hours after a meal, and bedtime 01/16/21   [name], MD  Lancets MISC Use as directed to check blood sugar 3 (three) times daily. 07/28/23   Marrianne Mood, MD  sacubitril-valsartan (ENTRESTO) 24-26 MG Take 1 tablet by mouth 2 (two) times daily. 06/28/23 07/28/23  Tawkaliyar, Roya, DO  spironolactone (ALDACTONE) 25 MG tablet Take 12.5 mg by mouth daily. 07/08/23   [provider]  torsemide (DEMADEX) 20 MG tablet Take 2 tablets (40 mg total) by mouth every morning AND 1 tablet (20 mg total) every evening. 07/14/23   Clegg, Amy D, NP  Vitamin D, Ergocalciferol, (DRISDOL) 1.25 MG (50000 UNIT) CAPS capsule Take 1 capsule (50,000 Units total) by mouth every 7 (seven) days. 07/28/23   Marrianne Mood, MD      Allergies    Canagliflozin    Review of Systems   Review of Systems  All other systems reviewed and are negative.   Physical Exam Updated Vital Signs BP 127/63   Pulse 73   Temp (!) 97.5 F (36.4 C) (Axillary)   Resp 13   Ht 5\' 7"  (1.702 m)   Wt 100.2 kg   LMP  (LMP Unknown)   SpO2 95%   BMI 34.60 kg/m  Physical Exam Vitals and nursing note reviewed.  Constitutional:      General: She is not in acute distress.    Appearance: She is well-developed.     Comments: Chronically ill-appearing female currently on nonrebreather appears to be in no acute discomfort.  HENT:     Head: Normocephalic and atraumatic.     Mouth/Throat:     Comments: Excessive saliva in mouth Eyes:     Extraocular Movements:  Extraocular movements intact.     Conjunctiva/sclera: Conjunctivae normal.     Pupils: Pupils are equal, round, and reactive to light.  Cardiovascular:     Rate and Rhythm: Normal rate and regular rhythm.  Pulmonary:     Effort: Pulmonary effort is normal.     Breath sounds: Rhonchi present. No rales.  Abdominal:     Palpations: Abdomen is soft.     Tenderness: There is no abdominal tenderness.  Musculoskeletal:     Cervical back: Normal range of motion and neck supple.     Right lower leg: No edema.     Left lower leg: Edema (2+ pitting to left  lower extremity extending towards the knee) present.  Skin:    Findings: No rash.  Neurological:     Mental Status: She is alert. Mental status is at baseline.  Psychiatric:        Mood and Affect: Mood normal.     ED Results / Procedures / Treatments   Labs (all labs ordered are listed, but only abnormal results are displayed) Labs Reviewed  SARS CORONAVIRUS 2 BY RT PCR - Abnormal; Notable for the following components:      Result Value   SARS Coronavirus 2 by RT PCR POSITIVE (*)    All other components within normal limits  BASIC METABOLIC PANEL - Abnormal; Notable for the following components:   Sodium 133 (*)    Chloride 91 (*)    Glucose, Bld 174 (*)    Creatinine, Ser 1.33 (*)    Calcium 8.0 (*)    GFR, Estimated 44 (*)    All other components within normal limits  CBC WITH DIFFERENTIAL/PLATELET - Abnormal; Notable for the following components:   WBC 13.6 (*)    RBC 3.21 (*)    Hemoglobin 8.6 (*)    HCT 28.1 (*)    RDW 18.8 (*)    nRBC 3.7 (*)    Neutro Abs 11.9 (*)    Lymphs Abs 0.6 (*)    Abs Immature Granulocytes 0.17 (*)    All other components within normal limits  BRAIN NATRIURETIC PEPTIDE - Abnormal; Notable for the following components:   B Natriuretic Peptide 320.4 (*)    All other components within normal limits  I-STAT VENOUS BLOOD GAS, ED - Abnormal; Notable for the following components:   pH, Ven 7.543  (*)    pCO2, Ven 35.6 (*)    Bicarbonate 30.6 (*)    Acid-Base Excess 8.0 (*)    Sodium 132 (*)    Calcium, Ion 0.88 (*)    HCT 27.0 (*)    Hemoglobin 9.2 (*)    All other components within normal limits  TROPONIN I (HIGH SENSITIVITY) - Abnormal; Notable for the following components:   Troponin I (High Sensitivity) 26 (*)    All other components within normal limits  PATHOLOGIST SMEAR REVIEW  POC OCCULT BLOOD, ED  TROPONIN I (HIGH SENSITIVITY)    EKG EKG Interpretation Date/Time:  Tweedy August 06 2023 08:13:41 EDT Ventricular Rate:  80 PR Interval:  97 QRS Duration:  106 QT Interval:  379 QTC Calculation: 432 R Axis:   -23  Text Interpretation: Sinus or ectopic atrial rhythm Ventricular premature complex Short PR interval Borderline left axis deviation Low voltage, precordial leads Borderline T abnormalities, lateral leads Confirmed by Vonita Moss [phone]) on 08/06/2023 8:56:27 AM  Radiology DG Chest Port 1 View  Result Date: 08/06/2023 CLINICAL DATA:  sob EXAM: PORTABLE CHEST 1 VIEW COMPARISON:  Chest radiograph 07/25/2023 FINDINGS: Cardiomegaly. No pleural effusion. No pneumothorax. Hazy bibasilar airspace opacities are favored to represent atelectasis. No radiographically apparent displaced rib fractures. Visualized upper abdomen is unremarkable. IMPRESSION: 1. Slightly improved hazy bibasilar airspace opacities compared to prior chest radiograph. 2.  Cardiomegaly Electronically Signed   By: Lorenza Cambridge M.D.   On: 08/06/2023 08:46    Procedures .Critical Care  Performed by: Fayrene Helper, PA-C Authorized by: Fayrene Helper, PA-C   Critical care provider statement:    Critical care time (minutes):  30   Critical care was time spent personally by me on the following activities:  Development of treatment plan with patient or  surrogate, discussions with consultants, evaluation of patient's response to treatment, examination of patient, ordering and review of laboratory  studies, ordering and review of radiographic studies, ordering and performing treatments and interventions, pulse oximetry, re-evaluation of patient's condition and review of old charts     Medications Ordered in ED Medications  methylPREDNISolone sodium succinate (SOLU-MEDROL) 125 mg/2 mL injection 125 mg (125 mg Intravenous Given 08/06/23 0933)  ipratropium (ATROVENT) nebulizer solution 1 mg (1 mg Nebulization Given 08/06/23 0934)  albuterol (PROVENTIL) (2.5 MG/3ML) 0.083% nebulizer solution 5 mg (5 mg Nebulization Given 08/06/23 0934)  iohexol (OMNIPAQUE) 350 MG/ML injection 75 mL (75 mLs Intravenous Contrast Given 08/06/23 1039)    ED Course/ Medical Decision Making/ A&P                                 Medical Decision Making Amount and/or Complexity of Data Reviewed Labs: ordered. Radiology: ordered.  Risk Prescription drug management. Decision regarding hospitalization.   BP (!) 130/56   Pulse (!) 109   Temp (!) 97.5 F (36.4 C) (Axillary)   Resp (!) 24   Ht 5\' 7"  (1.702 m)   Wt 100.2 kg   LMP  (LMP Unknown)   SpO2 94%   BMI 34.60 kg/m   11:34 AM 67 year old female with significant history of COPD, CHF currently on chronic home O2 around 4 to 5 L/min and, recent pneumonia, diabetes, CAD brought here via EMS from home for evaluation of shortness of breath.  Per EMS note, family member found patient unresponsive at home with snoring respiration.  When EMS arrived, patient was found to be hypoxic with an O2 sats of 72% on 4 L.  Patient was placed on a nonrebreather and became more oriented and O2 sats improved.  Patient states she overall felt fine she does not know why she is in the hospital.  She did not recall having increased shortness of breath.  She did mention some mild headache several days ago and does endorse some productive cough.  She is complaining of increasing swelling to her left leg but states that she has had leg swelling in the past.  She does not endorse any  significant chest pain abdominal pain no nausea vomiting.  On exam this is a chronically ill-appearing female appears to be in no acute respiratory distress.  She is wearing a nonrebreather mask.  She is able to speak in complete sentences.  She does have excessive saliva in her mouth.  Heart with normal rate and rhythm, lungs with both rales or rhonchi heard abdomen nontender to palpation she does have 2+ pitting edema to her left lower extremity when compared to right.  Edema extending towards the knee.  She has intact distal pulses and able to move all 4 extremities.  -Labs ordered, independently viewed and interpreted by me.  Labs remarkable for Covid positive. Pt did test positive for covid infection 3 weeks ago.  pH is 7.543 with a bicarb of 30 suggestive of metabolic alkalosis.  WBC 13.6, improves from prior.  Hgb 8.6, will check hemoccult.  BNP 320 and elevated trop of 26.    LLE DVT considered, vs dependent edema  -The patient was maintained on a cardiac monitor.  I personally viewed and interpreted the cardiac monitored which showed an underlying rhythm of: NSR or ectopic atrial rhythm -Imaging independently viewed and interpreted by me and I agree with radiologist's interpretation.  Result remarkable for  CXR showing improve bibasilar opacities.  Chest CTA and head CT is pending -This patient presents to the ED for concern of hypoxia, this involves an extensive number of treatment options, and is a complaint that carries with it a high risk of complications and morbidity.  The differential diagnosis includes COPD exacerbation, resp failure from covid, CHF exacerbation, PE, pna, anemia -Co morbidities that complicate the patient evaluation includes DM, CAD, HLD, COPD -Treatment includes albuterol/atrovent, solumedrol -Reevaluation of the patient after these medicines showed that the patient improved -PCP office notes or outside notes reviewed -Discussion with specialist IM teaching service  resident who agrees to see and will admit pt. -Escalation to admission/observation considered: patient and family member is agreeable with admission.         Final Clinical Impression(s) / ED Diagnoses Final diagnoses:  COPD exacerbation (HCC)  Left leg swelling  Acute respiratory failure due to COVID-19 [facility])    Rx / DC Orders ED Discharge Orders     None         Fayrene Helper, PA-C 08/06/23 1149    Rondel Baton, MD 08/08/23 1945

## 2023-08-06 NOTE — Progress Notes (Signed)
   08/06/23 1517  Spiritual Encounters  Type of Visit Initial  Care provided to: Patient  Reason for visit Routine spiritual support  OnCall Visit No   Chaplain  noticed Pt in RESUS as I was attending Trauma 2 page; Recognized Pt from a previous hospitalization.  Pt was struggling to get situated in the bed comfortably and so I assisted.  Pt recognized me as well and shared with me her updates.  Chaplain provided compassionate presence and empathetic listening.  Chaplain offered prayer when It was asked for.  Chaplain services remain available by Spiritual Consult or for emergent cases, paging [phone]  Chaplain Raelene Bott, MDiv [email or web address] [phone]

## 2023-08-06 NOTE — ED Notes (Signed)
Pt placed to 8L HFNC, currently satting at 94%.

## 2023-08-06 NOTE — H&P (Cosign Needed Addendum)
Date: 08/06/2023         Patient Name:  Alicia [name] MRN: [id number]  DOB: 02-13-1956 Age / Sex: 67 y.o., female   PCP: [name], MD         Medical Service: Internal Medicine Teaching Service         Attending Physician: Dr. Reymundo Poll, MD    First Contact: Dr. Hassan Rowan Pager: [phone]  Second Contact: Dr. Benito Mccreedy Pager: [phone]       After Hours (After 5p/  First Contact Pager: [phone]  weekends / holidays): Second Contact Pager: [phone]   Chief Concern: Unresponsive with hypoxia  History of Present Illness: 67 year old female well-known to the internal medicine teaching service presents after her daughter had trouble waking her from sleep this morning.  She was also noted to be hypoxic by home SpO2 monitoring.  Patient's awake and alert at the time of my interview.  History notable for recent hospitalization for COVID pneumonia and heart failure exacerbation.  She was discharged in good condition, felt well for a few days before onset of exertional dyspnea.  This has been worsening, and severe now.  She can barely walk from bed to bathroom without becoming very short of breath.  She also has cough with some sputum.  She woke up with some vomiting overnight last night.  Her appetites been somewhat decreased.  She had subjective fevers last night.  Review of Systems  Constitutional:  Positive for fever (Subjective) and malaise/fatigue. Negative for weight loss.  Respiratory:  Positive for cough, sputum production and shortness of breath.   Cardiovascular:  Positive for leg swelling. Negative for chest pain.  Gastrointestinal:  Positive for vomiting. Negative for constipation.  Genitourinary:  Negative for dysuria.  Neurological:  Negative for loss of consciousness.   In the ED she was hypoxic and hemodynamically stable.  Treatment was started for acute on chronic COPD with IV methylprednisolone and bronchodilators.  CT PE showed no PE but some  consolidation in the lower lobe of the left lung.  Allergies: Allergies  Allergen Reactions   Canagliflozin Itching, Anxiety and Palpitations   Past Medical History: Chronic hypoxic respiratory failure on 5 L home supplemental oxygen, chronic CHF with recovered LV ejection fraction, COPD, type 2 diabetes with diabetic retinopathy, SA node dysfunction, obesity, CVA.  Medications: Current Outpatient Medications  Medication Instructions   ACCU-CHEK AVIVA PLUS test strip USE TO TEST BLOOD SUGARS AS DIRECTED   albuterol (PROVENTIL) (2.5 MG/3ML) 0.083% nebulizer solution INHALE 3 ML BY NEBULIZATION EVERY 6 HOURS AS NEEDED FOR WHEEZING OR SHORTNESS OF BREATH   amLODipine (NORVASC) 10 MG tablet TAKE 1 TABLET BY MOUTH EVERY DAY   apixaban (ELIQUIS) 5 mg, Oral, 2 times daily   atorvastatin (LIPITOR) 80 mg, Oral, Daily   B-D UF III MINI PEN NEEDLES 31G X 5 MM MISC USE 3 TIMES A DAY WITH HUMALOG   carbamide peroxide (DEBROX) 6.5 % OTIC solution 5 drops, Both EARS, 2 times daily   ezetimibe (ZETIA) 10 mg, Oral, Daily   Fluticasone-Umeclidin-Vilant (TRELEGY ELLIPTA) 200-62.5-25 MCG/ACT AEPB 1 puff, Inhalation, Daily   furosemide (LASIX) 80 mg, Oral, Daily   Glucose Blood (BLOOD GLUCOSE TEST STRIPS) STRP Use as directed to check blood sugar 3 (three) times daily.   guaiFENesin-dextromethorphan (ROBITUSSIN DM) 100-10 MG/5ML syrup 10 mLs, Oral, Daily at bedtime   hydrochlorothiazide (HYDRODIURIL) 25 mg, Oral, Daily   insulin glargine (LANTUS) 30 Units, Subcutaneous, Daily   insulin lispro (HUMALOG) 100 UNIT/ML  injection Use as directed   insulin lispro (HUMALOG) 100 UNIT/ML KwikPen Inject 14 Units into the skin 2 (two) times daily with breakfast and lunch AND 20 Units daily before supper. Only take if eating a meal AND Blood Glucose (BG) is 80 or higher..   Insulin Pen Needle 31G X 8 MM MISC Use 3 (three) times daily.   Insulin Pen Needle 32G X 4 MM MISC Use to inject insulin 4 times a day. The patient  is insulin requiring, ICD 10 code 11.10. The patient injects 4 times per day.   Insulin Syringe-Needle U-100 31G X 15/64" 0.3 ML MISC Use to inject Humalog before meals three times a day   Lancet Device MISC Use 3 (three) times daily.   Lancets (ACCU-CHEK MULTICLIX) lancets Use to check your blood sugar four times daily: early morning, before a meal, two hours after a meal, and bedtime   Lancets MISC Use as directed to check blood sugar 3 (three) times daily.   sacubitril-valsartan (ENTRESTO) 24-26 MG 1 tablet, Oral, 2 times daily   spironolactone (ALDACTONE) 12.5 mg, Oral, Daily   torsemide (DEMADEX) 20 MG tablet Take 2 tablets (40 mg total) by mouth every morning AND 1 tablet (20 mg total) every evening.   Trulicity 1.5 mg, Subcutaneous, Weekly   Vitamin D (Ergocalciferol) (DRISDOL) 50,000 Units, Oral, Every 7 days   Has some confusion about the medicine that she takes.  Reports conflicting information on instructions about whether or not to take spironolactone.  She reports that she has been taking torsemide 40 mg (2 tablets) twice daily.  She has had trouble getting her Trulicity recently.  I note the presence of both torsemide and furosemide on her medication list.  Surgical History: Reviewed, noncontributory  Family History:  Reviewed, noncontributory  Social History:  Lives at home.  Has not been driving as much recently because she has concerns about falling asleep or losing consciousness at the wheel.  Still enjoys getting out of the house when she feels well.  Supported by friends and family in town.  Follows with Dr. Erlinda Hong at the internal medicine center.  Physical Exam: Blood pressure (!) 151/65, pulse 78, temperature 98 F (36.7 C), resp. rate 10, height 5\' 7"  (1.702 m), weight 100.2 kg, SpO2 91%.  No distress Heart rate is normal, rhythm is regular, radial pulses are strong, bilateral lower extremity edema to knees Breathing is unlabored on 5 L supplemental O2,  coarse crackles with some wheezing throughout all lung fields Abdomen is somewhat distended, but soft and nontender Skin is warm and dry Alert and oriented  EKG:  Sinus rhythm  Labs: VBG 7.543/35.6 Mild hyponatremia Creatinine 1.33 up from baseline of 0.8-1 BNP 320.4 Troponin 26 WBC 13.6 Hemoglobin 8.6  Images and other studies: CTA chest shows no evidence of PE.  Consolidation of bilateral lower lungs noted.  Assessment & Plan:  Alicia [name] is a 67 y.o. with history of chronic CHF, COPD who presents with acute on chronic hypoxic respiratory failure and signs of volume overload.  Principal Problem:   CHF exacerbation (HCC)   Acute on chronic respiratory failure with hypoxia (HCC) Worsening dyspnea on exertion and cough production of some scant sputum.  Hypervolemic on exam.  Note weight is increased by about 13 pounds over the last month or so despite less p.o. intake of late and lots of time spent in hospital.  Leading suspicion is for acute on chronic CHF exacerbation.  Not in a low output  state at present.   Stable on 5 L supplemental oxygen but desaturates pretty quickly when speaking/eating.  Plethysmography looks good but I note discordance between measurements on extremities versus earlobe, with earlobe reading consistently higher SpO2. Will start aggressive diuresis with IV furosemide.  I expect her to improve rapidly over the next 24 hours if this impression is correct.  Otherwise, consider unresolved pneumonia, less likely without fever or productive cough.  COPD exacerbation possible but hypervolemia on exam more consistent with CHF exacerbation. - Start furosemide IV 120 mg 3 times daily - Strict I/O - A.m. BMP and magnesium - Hold outpatient antihypertensives and beta-blockers  Addendum 5:25 PM Worrisome troponin trend 26 => 101. Her lack of chest pain is reassuring. EKG was without acute ST segment abnormalities.  Repeat EKG stat. Continue trending troponin to  peak. Will ask cross-cover team to reassess in person this afternoon. This may be type II MI given her hypoxic respiratory failure but concern for NSTEMI is higher now.  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  Insulin per below. - Glargine 40 units daily - Resistant SSI    Sinoatrial node dysfunction (HCC) Historically avoided beta-blockers in this person.  No bradycardia today.    COPD (HCC) Status post IV methylprednisolone in the ED.  Lower suspicion for COPD exacerbation given clinical indicators of volume overload.  Restart home LAMA LABA ICS and bronchodilators.  Defer steroids for now. - Breo Ellipta - Umeclidinium bromide - Albuterol via neb every 6 hours as needed    Paroxysmal atrial fibrillation (HCC) Sinus rhythm today.  Continue outpatient anticoagulation. - Apixaban 5 mg twice daily    COVID-19 virus infection Suspect persistent positivity from prior infection, for which she was admitted and treated with prednisone and baricitinib with improvement.  Defer further therapy for this.  Level of care: Admit to progressive Diet: Regular with 1500 mL fluid restriction IVF: N/A VTE: Therapeutic apixaban Code: Full Surrogate: Daughter [name]  Signed: Marrianne Mood MD 08/06/2023, 2:36 PM

## 2023-08-06 NOTE — Telephone Encounter (Signed)
Next appt scheduled 9/16 with Dr Oswaldo Done.
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
## 2023-08-06 NOTE — Progress Notes (Signed)
Alerted by Dr. Benito Mccreedy of rising troponin from 26-101.  Went to assess the patient who was eating and appeared comfortable.  She denied any change in symptoms and actually noted that she has been feeling better since she had been admitted.  Denies chest pain/discomfort, increased shortness of breath, heart palpitations, lightheadedness or dizziness, abdominal pain, nausea, or vomiting.  On exam she was breathing well and satting well on 10 L of oxygen which she had turned up while she eats (this is not abnormal for her).  No significant abnormal heart sounds or lung sounds in the anterior fields.  Heart was in a regular rate and rhythm and blood pressure was stable at 130/53.  Plan implemented by Dr. Benito Mccreedy of repeat troponin, repeat EKG, and ordered echocardiogram.  We will follow-up repeat troponin and EKG tonight.  Please page number below for any acute changes.  Rocky Morel, DO Internal Medicine Resident, PGY-2 Pager# [phone] Please contact the on call pager after 5 pm and on weekends at [phone].

## 2023-08-06 NOTE — ED Notes (Addendum)
Pt placed on 15L HFNC. 86% SpO2. Patient alert and oriented with regular breathing. MD notified of situation.

## 2023-08-06 NOTE — ED Triage Notes (Signed)
Hx of COPD, recent pneumonia and CHF. On chronic home O2 at 4-5LPM. Family found pt unresponsive at home with snoring respirations. Found hypoxic with 72% on 4LPM [place] at home. Nonrebreather initiated by EMS. Pt is now alert and oriented. Currently at 93% on nonrebreather. Pt reports sob, green colored phlegm and n/v. Denies pain. Seen here 2 weeks ago for same complaints.   EMS VS: 120/80 82 HR 93% on nonrebreather CBG 212

## 2023-08-06 NOTE — ED Notes (Signed)
Pt on 8L HFNC. Pt constantly desatting in low 80s.

## 2023-08-06 NOTE — ED Notes (Signed)
Patient placed on 6L via salter [place]. Current O2 sat running in the mid 90s.

## 2023-08-07 ENCOUNTER — Other Ambulatory Visit: Payer: Self-pay

## 2023-08-07 DIAGNOSIS — I5030 Unspecified diastolic (congestive) heart failure: Secondary | ICD-10-CM | POA: Diagnosis not present

## 2023-08-07 DIAGNOSIS — I495 Sick sinus syndrome: Secondary | ICD-10-CM | POA: Diagnosis present

## 2023-08-07 DIAGNOSIS — Z7951 Long term (current) use of inhaled steroids: Secondary | ICD-10-CM | POA: Diagnosis not present

## 2023-08-07 DIAGNOSIS — E871 Hypo-osmolality and hyponatremia: Secondary | ICD-10-CM | POA: Diagnosis present

## 2023-08-07 DIAGNOSIS — J9621 Acute and chronic respiratory failure with hypoxia: Secondary | ICD-10-CM | POA: Diagnosis not present

## 2023-08-07 DIAGNOSIS — E1165 Type 2 diabetes mellitus with hyperglycemia: Secondary | ICD-10-CM | POA: Diagnosis present

## 2023-08-07 DIAGNOSIS — U071 COVID-19: Secondary | ICD-10-CM | POA: Diagnosis not present

## 2023-08-07 DIAGNOSIS — I700 Atherosclerosis of aorta: Secondary | ICD-10-CM | POA: Diagnosis present

## 2023-08-07 DIAGNOSIS — I5033 Acute on chronic diastolic (congestive) heart failure: Secondary | ICD-10-CM

## 2023-08-07 DIAGNOSIS — J441 Chronic obstructive pulmonary disease with (acute) exacerbation: Secondary | ICD-10-CM

## 2023-08-07 DIAGNOSIS — Z888 Allergy status to other drugs, medicaments and biological substances status: Secondary | ICD-10-CM | POA: Diagnosis not present

## 2023-08-07 DIAGNOSIS — Z794 Long term (current) use of insulin: Secondary | ICD-10-CM | POA: Diagnosis not present

## 2023-08-07 DIAGNOSIS — Z8616 Personal history of COVID-19: Secondary | ICD-10-CM | POA: Diagnosis not present

## 2023-08-07 DIAGNOSIS — I5043 Acute on chronic combined systolic (congestive) and diastolic (congestive) heart failure: Secondary | ICD-10-CM | POA: Diagnosis not present

## 2023-08-07 DIAGNOSIS — I2489 Other forms of acute ischemic heart disease: Secondary | ICD-10-CM | POA: Insufficient documentation

## 2023-08-07 DIAGNOSIS — Z7985 Long-term (current) use of injectable non-insulin antidiabetic drugs: Secondary | ICD-10-CM | POA: Diagnosis not present

## 2023-08-07 DIAGNOSIS — J9600 Acute respiratory failure, unspecified whether with hypoxia or hypercapnia: Secondary | ICD-10-CM

## 2023-08-07 DIAGNOSIS — E11649 Type 2 diabetes mellitus with hypoglycemia without coma: Secondary | ICD-10-CM | POA: Diagnosis not present

## 2023-08-07 DIAGNOSIS — I2510 Atherosclerotic heart disease of native coronary artery without angina pectoris: Secondary | ICD-10-CM | POA: Diagnosis present

## 2023-08-07 DIAGNOSIS — Z9981 Dependence on supplemental oxygen: Secondary | ICD-10-CM | POA: Diagnosis not present

## 2023-08-07 DIAGNOSIS — I480 Paroxysmal atrial fibrillation: Secondary | ICD-10-CM | POA: Diagnosis present

## 2023-08-07 DIAGNOSIS — Z7901 Long term (current) use of anticoagulants: Secondary | ICD-10-CM | POA: Diagnosis not present

## 2023-08-07 DIAGNOSIS — E113399 Type 2 diabetes mellitus with moderate nonproliferative diabetic retinopathy without macular edema, unspecified eye: Secondary | ICD-10-CM | POA: Diagnosis present

## 2023-08-07 DIAGNOSIS — I110 Hypertensive heart disease with heart failure: Secondary | ICD-10-CM | POA: Diagnosis present

## 2023-08-07 DIAGNOSIS — Z79899 Other long term (current) drug therapy: Secondary | ICD-10-CM | POA: Diagnosis not present

## 2023-08-07 DIAGNOSIS — I5032 Chronic diastolic (congestive) heart failure: Secondary | ICD-10-CM | POA: Diagnosis not present

## 2023-08-07 DIAGNOSIS — E662 Morbid (severe) obesity with alveolar hypoventilation: Secondary | ICD-10-CM | POA: Diagnosis present

## 2023-08-07 DIAGNOSIS — Z1152 Encounter for screening for COVID-19: Secondary | ICD-10-CM | POA: Diagnosis not present

## 2023-08-07 LAB — BLOOD GAS, ARTERIAL
Acid-Base Excess: 7.4 mmol/L — ABNORMAL HIGH (ref 0.0–2.0)
Bicarbonate: 34.7 mmol/L — ABNORMAL HIGH (ref 20.0–28.0)
O2 Saturation: 91.4 %
Patient temperature: 36.8
pCO2 arterial: 60 mmHg — ABNORMAL HIGH (ref 32–48)
pH, Arterial: 7.37 (ref 7.35–7.45)
pO2, Arterial: 62 mmHg — ABNORMAL LOW (ref 83–108)

## 2023-08-07 LAB — CBC
HCT: 29 % — ABNORMAL LOW (ref 36.0–46.0)
Hemoglobin: 8.9 g/dL — ABNORMAL LOW (ref 12.0–15.0)
MCH: 25.6 pg — ABNORMAL LOW (ref 26.0–34.0)
MCHC: 30.7 g/dL (ref 30.0–36.0)
MCV: 83.3 fL (ref 80.0–100.0)
Platelets: 382 10*3/uL (ref 150–400)
RBC: 3.48 MIL/uL — ABNORMAL LOW (ref 3.87–5.11)
RDW: 18.8 % — ABNORMAL HIGH (ref 11.5–15.5)
WBC: 13.7 10*3/uL — ABNORMAL HIGH (ref 4.0–10.5)
nRBC: 3.5 % — ABNORMAL HIGH (ref 0.0–0.2)

## 2023-08-07 LAB — URINALYSIS, ROUTINE W REFLEX MICROSCOPIC
Bilirubin Urine: NEGATIVE
Glucose, UA: NEGATIVE mg/dL
Hgb urine dipstick: NEGATIVE
Ketones, ur: NEGATIVE mg/dL
Nitrite: NEGATIVE
Protein, ur: 100 mg/dL — AB
Specific Gravity, Urine: 1.025 (ref 1.005–1.030)
pH: 5.5 (ref 5.0–8.0)

## 2023-08-07 LAB — URINALYSIS, MICROSCOPIC (REFLEX): Bacteria, UA: NONE SEEN

## 2023-08-07 LAB — GLUCOSE, CAPILLARY
Glucose-Capillary: 418 mg/dL — ABNORMAL HIGH (ref 70–99)
Glucose-Capillary: 445 mg/dL — ABNORMAL HIGH (ref 70–99)
Glucose-Capillary: 425 mg/dL — ABNORMAL HIGH (ref 70–99)
Glucose-Capillary: 388 mg/dL — ABNORMAL HIGH (ref 70–99)
Glucose-Capillary: 279 mg/dL — ABNORMAL HIGH (ref 70–99)

## 2023-08-07 LAB — BASIC METABOLIC PANEL
Anion gap: 16 — ABNORMAL HIGH (ref 5–15)
BUN: 35 mg/dL — ABNORMAL HIGH (ref 8–23)
CO2: 29 mmol/L (ref 22–32)
Calcium: 8.1 mg/dL — ABNORMAL LOW (ref 8.9–10.3)
Chloride: 85 mmol/L — ABNORMAL LOW (ref 98–111)
Creatinine, Ser: 1.87 mg/dL — ABNORMAL HIGH (ref 0.44–1.00)
GFR, Estimated: 29 mL/min — ABNORMAL LOW (ref >60)
Glucose, Bld: 454 mg/dL — ABNORMAL HIGH (ref 70–99)
Potassium: 4.8 mmol/L (ref 3.5–5.1)
Sodium: 130 mmol/L — ABNORMAL LOW (ref 135–145)

## 2023-08-07 LAB — GLUCOSE, RANDOM: Glucose, Bld: 472 mg/dL — ABNORMAL HIGH (ref 70–99)

## 2023-08-07 LAB — MAGNESIUM: Magnesium: 2 mg/dL (ref 1.7–2.4)

## 2023-08-07 MED ORDER — INSULIN ASPART 100 UNIT/ML IJ SOLN
14.0000 [IU] | Freq: Two times a day (BID) | INTRAMUSCULAR | Status: DC
  Administered 2023-08-08 – 2023-08-12 (×9): 14 [IU] via SUBCUTANEOUS

## 2023-08-07 MED ORDER — REVEFENACIN 175 MCG/3ML IN SOLN
175.0000 ug | Freq: Every day | RESPIRATORY_TRACT | Status: DC
  Administered 2023-08-07 – 2023-08-17 (×11): 175 ug via RESPIRATORY_TRACT
  Filled 2023-08-07 (×12): qty 3

## 2023-08-07 MED ORDER — ALBUTEROL SULFATE (2.5 MG/3ML) 0.083% IN NEBU
2.5000 mg | INHALATION_SOLUTION | RESPIRATORY_TRACT | Status: DC | PRN
  Administered 2023-08-07 – 2023-08-08 (×2): 2.5 mg via RESPIRATORY_TRACT
  Filled 2023-08-07 (×3): qty 3

## 2023-08-07 MED ORDER — AZITHROMYCIN 250 MG PO TABS
500.0000 mg | ORAL_TABLET | Freq: Every day | ORAL | Status: AC
  Administered 2023-08-07 – 2023-08-09 (×3): 500 mg via ORAL
  Filled 2023-08-07 (×3): qty 2

## 2023-08-07 MED ORDER — TORSEMIDE 20 MG PO TABS
40.0000 mg | ORAL_TABLET | Freq: Every day | ORAL | Status: DC
  Administered 2023-08-08: 40 mg via ORAL
  Filled 2023-08-07: qty 2

## 2023-08-07 MED ORDER — INSULIN ASPART PROT & ASPART (70-30 MIX) 100 UNIT/ML ~~LOC~~ SUSP
14.0000 [IU] | Freq: Two times a day (BID) | SUBCUTANEOUS | Status: DC
Start: 2023-08-08 — End: 2023-08-07

## 2023-08-07 MED ORDER — INSULIN ASPART 100 UNIT/ML IJ SOLN
20.0000 [IU] | Freq: Every day | INTRAMUSCULAR | Status: DC
  Administered 2023-08-07 – 2023-08-09 (×3): 20 [IU] via SUBCUTANEOUS

## 2023-08-07 MED ORDER — AZITHROMYCIN 250 MG PO TABS: 500.0000 mg | ORAL_TABLET | Freq: Every day | ORAL | Status: DC

## 2023-08-07 MED ORDER — AZITHROMYCIN 250 MG PO TABS: 250.0000 mg | ORAL_TABLET | Freq: Every day | ORAL | Status: DC

## 2023-08-07 MED ORDER — BUDESONIDE 0.25 MG/2ML IN SUSP
0.2500 mg | Freq: Two times a day (BID) | RESPIRATORY_TRACT | Status: DC
  Administered 2023-08-07 – 2023-08-17 (×20): 0.25 mg via RESPIRATORY_TRACT
  Filled 2023-08-07 (×22): qty 2

## 2023-08-07 MED ORDER — TORSEMIDE 20 MG PO TABS
20.0000 mg | ORAL_TABLET | Freq: Every day | ORAL | Status: DC
  Administered 2023-08-07: 20 mg via ORAL
  Filled 2023-08-07: qty 1

## 2023-08-07 MED ORDER — ONDANSETRON HCL 4 MG/2ML IJ SOLN
4.0000 mg | Freq: Once | INTRAMUSCULAR | Status: AC
  Administered 2023-08-07: 4 mg via INTRAVENOUS
  Filled 2023-08-07: qty 2

## 2023-08-07 MED ORDER — GUAIFENESIN-DM 100-10 MG/5ML PO SYRP
5.0000 mL | ORAL_SOLUTION | ORAL | Status: DC | PRN
  Administered 2023-08-07 – 2023-08-16 (×9): 5 mL via ORAL
  Filled 2023-08-07 (×9): qty 5

## 2023-08-07 MED ORDER — ARFORMOTEROL TARTRATE 15 MCG/2ML IN NEBU
15.0000 ug | INHALATION_SOLUTION | Freq: Two times a day (BID) | RESPIRATORY_TRACT | Status: DC
  Administered 2023-08-07 – 2023-08-17 (×20): 15 ug via RESPIRATORY_TRACT
  Filled 2023-08-07 (×23): qty 2

## 2023-08-07 MED ORDER — PREDNISONE 20 MG PO TABS
40.0000 mg | ORAL_TABLET | Freq: Every day | ORAL | Status: AC
  Administered 2023-08-08 – 2023-08-12 (×5): 40 mg via ORAL
  Filled 2023-08-07 (×5): qty 2

## 2023-08-07 MED ORDER — ONDANSETRON 4 MG PO TBDP
4.0000 mg | ORAL_TABLET | Freq: Once | ORAL | Status: AC
  Administered 2023-08-07: 4 mg via ORAL
  Filled 2023-08-07: qty 1

## 2023-08-07 NOTE — Progress Notes (Signed)
Patient sats 67% on 15L HFNC during bed mobility.  Placed NRB with the 15L HFNC and sats recovered to 98%. Able to wean patient back to 12L HFNC after activity.

## 2023-08-07 NOTE — Progress Notes (Signed)
HD#0 SUBJECTIVE:    ON she had an elevation in troponins, EKG consulted, felt to be more ischemic demand.   She slept well, feels cough is worsening. Worried that she had a seizure because her mouth was foaming yesterday.    OBJECTIVE:  Vital Signs: Vitals:   08/07/23 0514 08/07/23 0717 08/07/23 1015 08/07/23 1140  BP: 128/61 (!) 128/57  127/72  Pulse: 63  (!) 56   Resp: 19 18 (!) 22 18  Temp: 97.8 F (36.6 C) 98.2 F (36.8 C)  98.3 F (36.8 C)  TempSrc: Oral Oral  Oral  SpO2: 99%  91%   Weight: 102.9 kg     Height:       Supplemental O2: Nasal Cannula SpO2: 91 % O2 Flow Rate (L/min): 15 L/min  Filed Weights   08/06/23 0810 08/07/23 0514  Weight: 100.2 kg 102.9 kg     Intake/Output Summary (Last 24 hours) at 08/07/2023 1146 Last data filed at 08/07/2023 1145 Gross per 24 hour  Intake 422.46 ml  Output 450 ml  Net -27.54 ml   Net IO Since Admission: -27.54 mL [08/07/23 1146]  Physical Exam: Physical Exam  Patient Lines/Drains/Airways Status     Active Line/Drains/Airways     Name Placement date Placement time Site Days   Peripheral IV 08/06/23 20 G Right Antecubital 08/06/23  0830  Antecubital  1             ASSESSMENT/PLAN:  Assessment: Principal Problem:   CHF exacerbation (HCC) Active Problems:   Type 2 diabetes mellitus with moderate nonproliferative diabetic retinopathy (HCC)   COPD (chronic obstructive pulmonary disease) (HCC)   Sinoatrial node dysfunction (HCC)   Paroxysmal atrial fibrillation (HCC)   COVID-19 virus infection   Acute on chronic respiratory failure with hypoxia (HCC)   Demand ischemia of myocardium   Plan:  Acute on chronic respiratory failure with hypoxia (HCC) COPD exacerbation  Today Ms. Eastman has a dry cough, with dry wheezing and ronchi on all lung fields. She does not seem hypervolemic today, no JVD, no leg edema. Is having some urine output, but describes subrapubic fullness and dysuria, feels like she  cannot urinate like usual. Will check for UTI. She does have some scant sputum production but worsening cough, and hazy bibasilar airspace opacities in Cxr. Increased O2 demand. Mild improvement with lasix ON, no wet crackles or fluid overload today. Will treat as COPD exacerbation. - Breo Ellipta - Umeclidinium bromide - Albuterol via neb every 6 hours as needed -Azithromycin 500mg  3 days -Prednisone 40mg  x 5 days   HFpEF (EF 50-60%)  May have had some fluid overload yesterday. Unmeasured I/Os 3x urine output. Weight up 2lbs today. No BLE edema today.  - Stop furosemide. - Strict I/O -Daily weights - A.m. BMP and magnesium - Hold outpatient antihypertensives and beta-blockers  Possible UTI  Has suprapubic tenderness and fullness. Describes difficulty voiding. No urine retention on bladder scan. Will send UA.  Type 2 diabetes mellitus with moderate nonproliferative diabetic retinopathy (HCC) Historically poorly controlled.  Insulin per below. - Glargine 40 units daily - Novolog 14 units at  breakfast and lunch, 20 units with dinner. - Resistant SSI   Sinoatrial node dysfunction (HCC) Historically avoided beta-blockers in this person.  No bradycardia today.    Paroxysmal atrial fibrillation (HCC) Sinus rhythm today.  Continue outpatient anticoagulation. - Apixaban 5 mg twice daily   COVID-19 virus infection Likely from prior covid infection (last hospitalization).    Level of care:  progressive Diet: Regular with 1500 mL fluid restriction IVF: N/A VTE: Therapeutic apixaban Code: Full Surrogate: Daughter [name]   Signature: Miyoshi Ligas Alexander-Savino,MD  Internal Medicine Resident, PGY-1 [facility]  Pager: [phone] 11:46 AM, 08/07/2023   Please contact the on call pager after 5 pm and on weekends at [phone].

## 2023-08-08 ENCOUNTER — Inpatient Hospital Stay (HOSPITAL_COMMUNITY): Payer: 59

## 2023-08-08 ENCOUNTER — Other Ambulatory Visit (HOSPITAL_COMMUNITY): Payer: 59

## 2023-08-08 DIAGNOSIS — I5030 Unspecified diastolic (congestive) heart failure: Secondary | ICD-10-CM | POA: Diagnosis not present

## 2023-08-08 DIAGNOSIS — J9621 Acute and chronic respiratory failure with hypoxia: Secondary | ICD-10-CM | POA: Diagnosis not present

## 2023-08-08 DIAGNOSIS — U071 COVID-19: Secondary | ICD-10-CM | POA: Diagnosis not present

## 2023-08-08 DIAGNOSIS — J441 Chronic obstructive pulmonary disease with (acute) exacerbation: Secondary | ICD-10-CM | POA: Diagnosis not present

## 2023-08-08 DIAGNOSIS — I5033 Acute on chronic diastolic (congestive) heart failure: Secondary | ICD-10-CM | POA: Diagnosis not present

## 2023-08-08 LAB — CBC
HCT: 28.2 % — ABNORMAL LOW (ref 36.0–46.0)
Hemoglobin: 8.7 g/dL — ABNORMAL LOW (ref 12.0–15.0)
MCH: 25.9 pg — ABNORMAL LOW (ref 26.0–34.0)
MCHC: 30.9 g/dL (ref 30.0–36.0)
MCV: 83.9 fL (ref 80.0–100.0)
Platelets: 354 10*3/uL (ref 150–400)
RBC: 3.36 MIL/uL — ABNORMAL LOW (ref 3.87–5.11)
RDW: 19.3 % — ABNORMAL HIGH (ref 11.5–15.5)
WBC: 18.4 10*3/uL — ABNORMAL HIGH (ref 4.0–10.5)
nRBC: 3.7 % — ABNORMAL HIGH (ref 0.0–0.2)

## 2023-08-08 LAB — BASIC METABOLIC PANEL
Anion gap: 9 (ref 5–15)
BUN: 46 mg/dL — ABNORMAL HIGH (ref 8–23)
CO2: 31 mmol/L (ref 22–32)
Calcium: 8 mg/dL — ABNORMAL LOW (ref 8.9–10.3)
Chloride: 92 mmol/L — ABNORMAL LOW (ref 98–111)
Creatinine, Ser: 2.22 mg/dL — ABNORMAL HIGH (ref 0.44–1.00)
GFR, Estimated: 24 mL/min — ABNORMAL LOW (ref >60)
Glucose, Bld: 116 mg/dL — ABNORMAL HIGH (ref 70–99)
Potassium: 3.9 mmol/L (ref 3.5–5.1)
Sodium: 132 mmol/L — ABNORMAL LOW (ref 135–145)

## 2023-08-08 LAB — ECHOCARDIOGRAM COMPLETE
AR max vel: 2.43 cm2
AV Area VTI: 2.29 cm2
AV Area mean vel: 2.27 cm2
AV Mean grad: 7 mmHg
AV Peak grad: 12 mmHg
Ao pk vel: 1.73 m/s
Area-P 1/2: 4.6 cm2
Height: 67 in
S' Lateral: 4.32 cm
Weight: 3576.74 [oz_av]

## 2023-08-08 LAB — GLUCOSE, CAPILLARY
Glucose-Capillary: 102 mg/dL — ABNORMAL HIGH (ref 70–99)
Glucose-Capillary: 103 mg/dL — ABNORMAL HIGH (ref 70–99)
Glucose-Capillary: 151 mg/dL — ABNORMAL HIGH (ref 70–99)
Glucose-Capillary: 239 mg/dL — ABNORMAL HIGH (ref 70–99)
Glucose-Capillary: 271 mg/dL — ABNORMAL HIGH (ref 70–99)

## 2023-08-08 MED ORDER — POTASSIUM CHLORIDE CRYS ER 20 MEQ PO TBCR
20.0000 meq | EXTENDED_RELEASE_TABLET | Freq: Once | ORAL | Status: AC
  Administered 2023-08-08: 20 meq via ORAL
  Filled 2023-08-08: qty 1

## 2023-08-08 MED ORDER — PERFLUTREN LIPID MICROSPHERE
1.0000 mL | INTRAVENOUS | Status: AC | PRN
  Administered 2023-08-08: 2 mL via INTRAVENOUS

## 2023-08-08 MED ORDER — FUROSEMIDE 10 MG/ML IJ SOLN
120.0000 mg | Freq: Two times a day (BID) | INTRAVENOUS | Status: DC
  Administered 2023-08-08 – 2023-08-09 (×4): 120 mg via INTRAVENOUS
  Filled 2023-08-08: qty 10
  Filled 2023-08-08: qty 12
  Filled 2023-08-08 (×3): qty 10
  Filled 2023-08-08: qty 12

## 2023-08-08 NOTE — Plan of Care (Signed)

## 2023-08-08 NOTE — Progress Notes (Addendum)
HD#1 SUBJECTIVE:    ON she was placed on BiPaP but patient could not tolerate mask. Places on 13L o2 and was saturating at 94%    OBJECTIVE:  Vital Signs: Vitals:   08/08/23 0759 08/08/23 1058 08/08/23 1300 08/08/23 1508  BP:  (!) 164/69    Pulse: 70 63  62  Resp: (!) 21 (!) 21  20  Temp:  97.9 F (36.6 C)    TempSrc:  Oral    SpO2: 93% 96% (!) 89% 95%  Weight:  101.4 kg    Height:       Supplemental O2: Nasal Cannula SpO2: 95 % O2 Flow Rate (L/min): 8 L/min  Filed Weights   08/07/23 0514 08/08/23 0447 08/08/23 1058  Weight: 102.9 kg 103.6 kg 101.4 kg    Intake/Output Summary (Last 24 hours) at 08/08/2023 1533 Last data filed at 08/08/2023 1300 Gross per 24 hour  Intake 360 ml  Output 1550 ml  Net -1190 ml   Net IO Since Admission: -1,217.54 mL [08/08/23 1533]  Physical Exam: Physical Exam Constitutional:      General: She is not in acute distress.    Appearance: She is ill-appearing.  Neck:     Vascular: No JVD.  Cardiovascular:     Rate and Rhythm: Normal rate and regular rhythm.     Heart sounds: S1 normal and S2 normal. No murmur heard.    No friction rub. No gallop.  Pulmonary:     Effort: Accessory muscle usage and retractions present.     Breath sounds: Wheezing (decreased from yesterday) and rhonchi present. No decreased breath sounds or rales.  Abdominal:     General: Abdomen is flat and protuberant. Bowel sounds are normal. There is no distension.     Palpations: Abdomen is soft. There is no shifting dullness or fluid wave.     Tenderness: There is no abdominal tenderness.  Musculoskeletal:     Right lower leg: 2+ Pitting Edema present.     Left lower leg: 2+ Pitting Edema present.  Neurological:     Mental Status: She is alert.    Patient Lines/Drains/Airways Status     Active Line/Drains/Airways     Name Placement date Placement time Site Days   Peripheral IV 08/06/23 20 G Right Antecubital 08/06/23  0830  Antecubital  1              ASSESSMENT/PLAN:  Assessment: Principal Problem:   CHF exacerbation (HCC) Active Problems:   Type 2 diabetes mellitus with moderate nonproliferative diabetic retinopathy (HCC)   COPD (chronic obstructive pulmonary disease) (HCC)   Sinoatrial node dysfunction (HCC)   Paroxysmal atrial fibrillation (HCC)   COVID-19 virus infection   Acute on chronic respiratory failure with hypoxia (HCC)   Demand ischemia of myocardium   Acute respiratory failure due to COVID-19 [facility])   Plan:  Acute on chronic respiratory failure with hypoxia (HCC) COPD exacerbation  CHF exacerbation Hx of HFpEF (EF 50-60%)  Ms. Tarlton has a productive cough, with dry wheezing and ronchi on all lung fields which have decreased compared to yesterday. She has pitting edema, and also gained about 10lbs since her admission in January. Urine output is improving. UA not concerning for UTI.  - cwBreo Ellipta - cwUmeclidinium bromide - cwAlbuterol via neb every 6 hours as needed -cw Azithromycin 500mg  3 days -cw Prednisone 40mg  x 5 days  - furosemide 120 mg BID IV - Strict I/O -Daily weights - A.m. BMP and magnesium - Hold  outpatient antihypertensives and beta-blockers  Type 2 diabetes mellitus with moderate nonproliferative diabetic retinopathy (HCC) Historically poorly controlled. Now on prednisone. Insulin per below.  - Glargine 40 units daily - Novolog 14 units at  breakfast and lunch, 20 units with dinner. - Resistant SSI   Sinoatrial node dysfunction (HCC) Historically avoided beta-blockers in this person.  No bradycardia today.    Paroxysmal atrial fibrillation (HCC) Sinus rhythm today.  Continue outpatient anticoagulation. - Apixaban 5 mg twice daily   COVID-19 virus infection Likely from prior covid infection (last hospitalization).    Level of care: progressive Diet: Regular with 1500 mL fluid restriction IVF: N/A VTE: Therapeutic apixaban Code: Full Surrogate: Daughter [name]    Signature: [place] Alexander-Savino,MD  Internal Medicine Resident, PGY-1 [facility]  Pager: [phone] 3:33 PM, 08/08/2023   Please contact the on call pager after 5 pm and on weekends at [phone].      Internal Medicine Attending:   I was physically present during the key portions of the resident provided service and participated in medical decision making of the patient's management care in the assessment and plan.   She doesn't have crackles, but her weight is up 10 pounds since when I cared for her in August, so I agree that with this new information, I believe HFpEF is the driving force behind her acute hypoxic respiratory failure. Will trial diuresis with lasix 120mg  IV bid (this high dose was needed in the past).

## 2023-08-09 ENCOUNTER — Encounter: Payer: 59 | Admitting: Student in an Organized Health Care Education/Training Program

## 2023-08-09 DIAGNOSIS — I5043 Acute on chronic combined systolic (congestive) and diastolic (congestive) heart failure: Secondary | ICD-10-CM | POA: Diagnosis not present

## 2023-08-09 LAB — RENAL FUNCTION PANEL
Albumin: 3 g/dL — ABNORMAL LOW (ref 3.5–5.0)
Anion gap: 16 — ABNORMAL HIGH (ref 5–15)
BUN: 48 mg/dL — ABNORMAL HIGH (ref 8–23)
CO2: 31 mmol/L (ref 22–32)
Calcium: 8.5 mg/dL — ABNORMAL LOW (ref 8.9–10.3)
Chloride: 88 mmol/L — ABNORMAL LOW (ref 98–111)
Creatinine, Ser: 1.59 mg/dL — ABNORMAL HIGH (ref 0.44–1.00)
GFR, Estimated: 36 mL/min — ABNORMAL LOW (ref >60)
Glucose, Bld: 223 mg/dL — ABNORMAL HIGH (ref 70–99)
Phosphorus: 4.4 mg/dL (ref 2.5–4.6)
Potassium: 4.5 mmol/L (ref 3.5–5.1)
Sodium: 135 mmol/L (ref 135–145)

## 2023-08-09 LAB — GLUCOSE, CAPILLARY
Glucose-Capillary: 171 mg/dL — ABNORMAL HIGH (ref 70–99)
Glucose-Capillary: 135 mg/dL — ABNORMAL HIGH (ref 70–99)
Glucose-Capillary: 219 mg/dL — ABNORMAL HIGH (ref 70–99)
Glucose-Capillary: 195 mg/dL — ABNORMAL HIGH (ref 70–99)

## 2023-08-09 LAB — MAGNESIUM: Magnesium: 1.6 mg/dL — ABNORMAL LOW (ref 1.7–2.4)

## 2023-08-09 MED ORDER — MAGNESIUM SULFATE 2 GM/50ML IV SOLN
2.0000 g | Freq: Once | INTRAVENOUS | Status: AC
  Administered 2023-08-09: 2 g via INTRAVENOUS

## 2023-08-09 MED ORDER — HYDROXYZINE HCL 10 MG PO TABS
10.0000 mg | ORAL_TABLET | Freq: Three times a day (TID) | ORAL | Status: DC | PRN
  Administered 2023-08-09: 10 mg via ORAL
  Filled 2023-08-09: qty 1

## 2023-08-09 NOTE — Progress Notes (Signed)
Heart Failure Stewardship Pharmacist Progress Note   PCP: [name], MD PCP-Cardiologist: Donato Schultz, MD    HPI:  67 yo F with PMH of COPD, oxygen dependent, CHF (EF 20-25% in 2015, recovered to 55-60%), CAD, HLD, GERD, T2DM, and CVA.    Admitted in Nov 2023 with acute on chronic CHF exacerbation. Diuresed w/ IV Lasix and treated w/ abx, steroids and nebs. Echo showed normal EF 55-60% and normal RV. After diureses, was transitioned to PO Lasix. Also placed on Entresto.   She was seen in HF TOC 10/2022. Jardiance increased to 10 mg daily and spironlactone added. She was seen back in [facility] 11/2022 and spironolactone was increased to 25 mg daily. She was later seen at [facility] on 1/30 and lasix was decreased to 80 mg AM and 40 mg PM.   Admitted 01/2023 with acute on chronic CHF exacerbation. ECHO 3/17 showed LVEF 60-65%. Diuresed with IV lasix 120 mg, metolazone, and diamox. Discharged on Entresto, spironolactone, amlodipine, and lasix. [name] held due to UTI.    Admitted 05/2023 with a/c CHF. CXR showed pulmonary congestion. BNP elevated. ECHO 7/24 showed LVEF 50-60%, G3DD, no RWMA, RV normal, moderately elevated PA pressure. Spironolactone with held at discharge.  She was seen again in HF TOC 06/2023. Volume status trending up. She was instructed to increase torsemide to 40 mg AM and 20 mg PM. Spironolactone was restarted.   Admitted 06/2023 with COPD exacerbation due to COVID-19 infection. She was discharged on 9/4.   Presented back to the ED on 9/13 when family found her unresponsive and hypoxic at home. NRB initiated by EMS and was more alert in the ED. Reported shortness of breath, productive cough, and weight gain. CXR with hazy bibasilar airspace opacities and cardiomegaly. ECHO 9/15 showed LVEF 50-55%, global hypokinesis, G3DD, RV normal, moderate to severe TR and worsening pulmonary HTN.   She reports she was only taking the torsemide prior to admission. Unclear if she was  taking this once or twice daily. When asked about this she answered differently each time. She reports not taking the spironolactone because she wasn't sure if she could be taking this with being sick. Reports eating high sodium containing foods.   Current HF Medications: Diuretic: furosemide 120 mg IV BID  Prior to admission HF Medications: Diuretic: furosemide 80 mg daily AND torsemide 40 mg BID ACE/ARB/ARNI: Entresto 24/26 mg BID *reports not taking spironolactone (was confused if she should be taking it or not)  Pertinent Lab Values: Serum creatinine 1.59, BUN 48, Potassium 4.5, Sodium 135, BNP 320.4, Magnesium 1.6   Vital Signs: Weight: 216 lbs (admission weight: 220 lbs) Blood pressure: 150/50s  Heart rate: 60-70s  I/O: net -2.1L yesterday; net -3.3L since admission  Medication Assistance / Insurance Benefits Check: Does the patient have prescription insurance?  Yes Type of insurance plan: [place] Medicaid  Outpatient Pharmacy:  Prior to admission [facility] Is the patient willing to use [place] [facility]OC pharmacy at discharge? Yes Is the patient willing to transition their outpatient pharmacy to utilize a [facility] outpatient pharmacy?   No    Assessment: 1. Acute on chronic HFimpEF (LVEF 50-55%). NYHA class IV symptoms. - Continue furosemide 120 mg IV BID. LE edema is improving. Script was written for furosemide and she was instructed to continue taking torsemide on her last discharge. Encourage resuming torsemide 40 mg BID at discharge. Strict I/Os and daily weights. Keep K>4 and Mg>2. Magnesium 2 g IV x 1 given for replacement.  Recheck in AM. - GDMT held with AKI, creatinine is improving   Plan: 1) Medication changes recommended at this time: - Continue IV diuresis - Stop furosemide at discharge; continue torsemide 40 mg BID  2) Patient assistance: - None pending, has [place] Medicaid   3)  Education  - Patient has been educated on current HF medications and potential  additions to HF medication regimen - Patient verbalizes understanding that over the next few months, these medication doses may change and more medications may be added to optimize HF regimen - Patient has been educated on basic disease state pathophysiology and goals of therapy   Sharen Hones, PharmD, BCPS Heart Failure Stewardship Pharmacist Phone [phone]

## 2023-08-09 NOTE — Plan of Care (Signed)
  Problem: Education: Goal: Ability to describe self-care measures that may prevent or decrease complications (Diabetes Survival Skills Education) will improve Outcome: Progressing Goal: Individualized Educational Video(s) Outcome: Progressing   Problem: Coping: Goal: Ability to adjust to condition or change in health will improve Outcome: Progressing   Problem: Fluid Volume: Goal: Ability to maintain a balanced intake and output will improve Outcome: Progressing   Problem: Metabolic: Goal: Ability to maintain appropriate glucose levels will improve Outcome: Progressing   Problem: Nutritional: Goal: Maintenance of adequate nutrition will improve Outcome: Progressing   Problem: Skin Integrity: Goal: Risk for impaired skin integrity will decrease Outcome: Progressing   Problem: Education: Goal: Knowledge of General Education information will improve Description: Including pain rating scale, medication(s)/side effects and non-pharmacologic comfort measures Outcome: Progressing   Problem: Elimination: Goal: Will not experience complications related to bowel motility Outcome: Progressing   Problem: Safety: Goal: Ability to remain free from injury will improve Outcome: Progressing   Problem: Skin Integrity: Goal: Risk for impaired skin integrity will decrease Outcome: Progressing

## 2023-08-09 NOTE — Progress Notes (Signed)
Heart Failure Nurse Navigator Progress Note  PCP: [name], MD PCP-Cardiologist: Anne Fu Admission Diagnosis: COPD exacerbation, left leg swelling, Acute respiratory failure due to Covid-19.  Admitted from: Home  Presentation:   Alicia [name] found unresponsive at home by her granddaughter with snoring respirations, found to be hypoxic sats 72%, placed on NRB. PAtient later reported shortness of breath, green sputum, CXR with bibasilar opacifications and cardiomegaly, increased BLE edema, 2+, Covid tested positive, Unsure if she has been taking her medication correctly at home.   Patient was last seen in HF TOC on 07/14/2023, on this admission she was reeducated on the sign and symptoms of heart failure, daily weights, when to call her doctor, or go to the ED, Diet/ fluid restrictions, taking all medications as prescribed and attending all doctor appointments. Patient verbalized her understanding, a HF TOC follow up appointment was scheduled for 08/24/2023 @ 1:30 pm.   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  Non-functioning   Aortic atherosclerosis (HCC) 09/24/2017   Asymptomatic, found on CT scan   Blood transfusion without reported diagnosis    Chronic Systolic Heart Failure 05/10/2014   Felt to be non-ischemic and secondary to hypertension.  Echo (05/28/2014): LVEF 25%.  Is not interested in AICD placement.   COPD exacerbation (HCC)    Coronary artery disease involving native coronary artery of native heart without angina pectoris 11/09/2014   Cardiac cath (02/18/2014): Non-obstructive, mRCA 30%, dRCA 60%   Cystocele with uterine prolapse - grade 3 02/12/2016   Not interested in pessary after trying   Diverticulosis of colon 09/24/2017   Diverticulosis of colon  09/24/2017   Essential hypertension 10/15/2013   Gastroesophageal reflux disease 09/24/2017   History of cerebrovascular accident 05/10/2013   Per patient report 6 previous strokes, most recent on 05/10/13.  No residual deficits.   History of cerebrovascular accident 05/10/2013   Per patient report 6 previous strokes, most recent on 05/10/13.  No residual deficits.   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 (HCC) 10/15/2013     Social History   Socioeconomic History   Marital status: Widowed    Spouse name: Not on file   Number of children: 3   Years of education: Not on file   Highest education level: High school graduate  Occupational History   Occupation: Disabled    Comment: Formerly worked in Dentist  Tobacco Use   Smoking status: Former    Current packs/day: 0.00    Average packs/day: 1 pack/day for 40.0 years (40.0 ttl pk-yrs)    Types: Cigarettes    Start date: 09/17/1979    Quit date: 09/17/2019    Years since quitting: 3.8   Smokeless tobacco: Never  Vaping Use   Vaping status: Never Used  Substance and Sexual Activity   Alcohol use: No    Alcohol/week: 0.0 standard drinks of alcohol   Drug use: No  Sexual activity: Not Currently    Birth control/protection: Post-menopausal  Other Topics Concern   Not on file  Social History Narrative   Current Social History 02/25/2021        Patient lives with family in a home which is 2 stories. There are steps up to the entrance the patient uses with handrails.       Patient's method of transportation is personal car.      The highest level of  education was some high school.      The patient currently disabled.      Identified important Relationships are "Family, my daughter and grandkids"       Pets : My dog       Interests / Fun: "sleeping, working in my flowerbed        Current Stressors: The war in [place]       Social Determinants of Health   Financial Resource Strain: Low Risk  (06/18/2023)   Overall Financial Resource Strain (CARDIA)    Difficulty of Paying Living Expenses: Not hard at all  Food Insecurity: No Food Insecurity (08/06/2023)   Hunger Vital Sign    Worried About Running Out of Food in the Last Year: Never true    Ran Out of Food in the Last Year: Never true  Transportation Needs: No Transportation Needs (08/07/2023)   PRAPARE - Administrator, Civil Service (Medical): No    Lack of Transportation (Non-Medical): No  Physical Activity: Inactive (05/19/2023)   Exercise Vital Sign    Days of Exercise per Week: 0 days    Minutes of Exercise per Session: 0 min  Stress: No Stress Concern Present (05/19/2023)   [employer] of Occupational Health - Occupational Stress Questionnaire    Feeling of Stress : Not at all  Social Connections: Socially Isolated (05/19/2023)   Social Connection and Isolation Panel [NHANES]    Frequency of Communication with Friends and Family: More than three times a week    Frequency of Social Gatherings with Friends and Family: Never    Attends Religious Services: Never    Database administrator or Organizations: No    Attends Banker Meetings: Never    Marital Status: Widowed    High Risk Criteria for Readmission and/or Poor Patient Outcomes: Heart failure hospital admissions (last 6 months): 0  No Show rate: 5% Difficult social situation: No Demonstrates medication adherence: Yes Primary Language: English Literacy level: Reading, writing, and comprehension  Barriers of Care:   Diet/ fluid restrictions Daily  weights  Considerations/Referrals:   Referral made to Heart Failure Pharmacist Stewardship: yes Referral made to Heart Failure CSW/NCM TOC: No Referral made to Heart & Vascular TOC clinic: Yes follow up , 08/24/2023 @ 1;30 pm, last seen Hf TOC 07/14/2023  Items for Follow-up on DC/TOC: Diet/ fluid restrictions Daily weights   Rhae Hammock, BSN, RN Heart Failure Teacher, adult education Only

## 2023-08-09 NOTE — Plan of Care (Signed)

## 2023-08-09 NOTE — Inpatient Diabetes Management (Addendum)
Inpatient Diabetes Program Recommendations  AACE/ADA: New Consensus Statement on Inpatient Glycemic Control (2015)  Target Ranges:  Prepandial:   less than 140 mg/dL      Peak postprandial:   less than 180 mg/dL (1-2 hours)      Critically ill patients:  140 - 180 mg/dL   Lab Results  Component Value Date   GLUCAP 171 (H) 08/09/2023   HGBA1C 8.2 (A) 05/03/2023    Latest Reference Range & Units 08/08/23 06:06 08/08/23 10:55 08/08/23 16:37 08/08/23 21:33 08/09/23 06:38  Glucose-Capillary 70 - 99 mg/dL 962 (H) 952 (H) 841 (H) 271 (H) 171 (H)  (H): Data is abnormally high  Diabetes history: DM2 Outpatient Diabetes medications: lantus 20-30 units at bedtime, Humalog 14-14-20 ac meal coverage Current orders for Inpatient glycemic control: Semglee 40 units daily, Novolog meal coverage 14-14-20 units ac meals, Novolog 0-20 units tid, Prednisone 40 mg daily (received Solumedrol 125 mg on 08/06/23)  A1c was 8.2 on 05/03/23  Inpatient Diabetes Program Recommendations:   Please consider: -Decrease Novolog meal coverage to 10 units tid  -Decrease Novolog correction to 0-15 units tid, 0-5 units hs -Add carb mod to diet if appropriate  Thank you, Darel Hong E. Alicia Camilo, RN, MSN, CDE  Diabetes Coordinator Inpatient Glycemic Control Team Team Pager [phone] (8am-5pm) 08/09/2023 10:17 AM

## 2023-08-09 NOTE — Consult Note (Signed)
Value-Based Care Institute   [facility] Inpatient Consult   08/09/2023  Alicia [name] November 24, 1955 [id number]  [name]  Accountable Care Organization [ACO] Patient: [name] Medicare [Dual]  Primary Care Provider:  [name], MD, [facility] is listed to provide the post hospital Patient with extreme high risk score with less than 30 days readmission risk.  Admitted with COVID [to preserve PPE], will follow with inpatient team.  Patient is on 10 L oxygen.  Patient is currently active with [facility] [[facility]] Care Management for care coordination services.  Patient has been engaged by a [employer].   The community based plan of care has focused on disease management and community resource support.    Plan:  Follow for needs and progression. Spoke with inpatient staff and TOC.  Patient with increase oxygen needs. Alerted that patient active with RN Care CC on behalf of [facility].  Of note, [facility] Care Management services does not replace or interfere with any services that are needed or arranged by inpatient [facility] care management team.   Charlesetta Shanks, RN, BSN, CCM [place]  [facility], [facility] Liaison Direct Dial: [phone] or secure chat Website: [email or web address]            .

## 2023-08-09 NOTE — Progress Notes (Signed)
HD#2 SUBJECTIVE:   Feels a lot better today. Has not be having trouble urinating. Feels she can breathe and her stomach has decreased in size as well as her legs.    OBJECTIVE:  Vital Signs: Vitals:   08/08/23 2020 08/09/23 0350 08/09/23 0814 08/09/23 0828  BP: (!) 161/60 (!) 156/69    Pulse: 67 64 66 68  Resp: 19 19 (!) 22   Temp: 97.7 F (36.5 C) 98.2 F (36.8 C)    TempSrc: Oral Oral    SpO2: 99% 94% 95% 92%  Weight:  98.4 kg    Height:       Supplemental O2: Nasal Cannula SpO2: 92 % O2 Flow Rate (L/min): 10 L/min  Filed Weights   08/08/23 0447 08/08/23 1058 08/09/23 0350  Weight: 103.6 kg 101.4 kg 98.4 kg    Intake/Output Summary (Last 24 hours) at 08/09/2023 0923 Last data filed at 08/09/2023 0354 Gross per 24 hour  Intake 480 ml  Output 3250 ml  Net -2770 ml   Net IO Since Admission: -3,257.54 mL [08/09/23 0923]  Physical Exam: Physical Exam Constitutional:      General: She is not in acute distress.    Appearance: She is ill-appearing.  Neck:     Vascular: No JVD.  Cardiovascular:     Rate and Rhythm: Normal rate and regular rhythm.     Heart sounds: S1 normal and S2 normal. No murmur heard.    No friction rub. No gallop.  Pulmonary:     Effort: Accessory muscle usage and retractions present.     Breath sounds: Wheezing (decreased from yesterday) and rhonchi present. No decreased breath sounds or rales.  Abdominal:     General: Abdomen is flat and protuberant. Bowel sounds are normal. There is distension (Improved since yesterday).     Palpations: Abdomen is soft. There is no shifting dullness or fluid wave.     Tenderness: There is no abdominal tenderness.  Musculoskeletal:     Right lower leg: 2+ Pitting Edema (Improved) present.     Left lower leg: 2+ Pitting Edema (Improved) present.  Neurological:     Mental Status: She is alert.    Patient Lines/Drains/Airways Status     Active Line/Drains/Airways     Name Placement date Placement  time Site Days   Peripheral IV 08/06/23 20 G Right Antecubital 08/06/23  0830  Antecubital  1            ASSESSMENT/PLAN:  Assessment: Principal Problem:   CHF exacerbation (HCC) Active Problems:   Type 2 diabetes mellitus with moderate nonproliferative diabetic retinopathy (HCC)   COPD (chronic obstructive pulmonary disease) (HCC)   Sinoatrial node dysfunction (HCC)   Paroxysmal atrial fibrillation (HCC)   COVID-19 virus infection   Acute on chronic respiratory failure with hypoxia (HCC)   Demand ischemia of myocardium   Acute respiratory failure due to COVID-19 [facility])   Plan:  Acute on chronic respiratory failure with hypoxia (HCC) COPD exacerbation  CHF exacerbation Hx of HFpEF (EF 50-60%)  Weight trending down -3.2 L yesterday, Weight down to 216.9 from 223.5 lbs yesterday. Cr improving 1.59 (2.22 yesterday). Wheezing improved in bilateral lungs. O2 still not at baseline of 3L (8L this AM). Will continue to diurese and treat for COPD exacerbation. - cw Breo Ellipta - cw Umeclidinium bromide - cw Albuterol via neb every 6 hours as needed - cw Azithromycin 500mg  3 days - cw Prednisone 40mg  x 5 days  - cw furosemide 120 mg  BID IV - Strict I/O - Daily weights - BMP daily -Magnesium repleted today - continue holding outpatient antihypertensives and beta-blockers  Type 2 diabetes mellitus with moderate nonproliferative diabetic retinopathy (HCC) Historically poorly controlled. Now on prednisone. Insulin per below.  - Glargine 40 units daily - Novolog 14 units at  breakfast and lunch, 20 units with dinner. - Resistant SSI   Sinoatrial node dysfunction (HCC) Historically avoided beta-blockers in this person.  No bradycardia today.    Paroxysmal atrial fibrillation (HCC) Sinus rhythm today.  Continue outpatient anticoagulation. - Apixaban 5 mg twice daily   COVID-19 virus infection Likely from prior covid infection (last hospitalization).    Level of care:  progressive Diet: Regular with 1500 mL fluid restriction IVF: N/A VTE: Therapeutic apixaban Code: Full Surrogate: Daughter [name]   Signature: Almond Fitzgibbon Alexander-Savino,MD  Internal Medicine Resident, PGY-1 [facility]  Pager: [phone] 9:23 AM, 08/09/2023   Please contact the on call pager after 5 pm and on weekends at [phone].

## 2023-08-10 DIAGNOSIS — J9621 Acute and chronic respiratory failure with hypoxia: Secondary | ICD-10-CM | POA: Diagnosis not present

## 2023-08-10 DIAGNOSIS — J441 Chronic obstructive pulmonary disease with (acute) exacerbation: Secondary | ICD-10-CM | POA: Diagnosis not present

## 2023-08-10 DIAGNOSIS — I5033 Acute on chronic diastolic (congestive) heart failure: Secondary | ICD-10-CM | POA: Diagnosis not present

## 2023-08-10 LAB — BASIC METABOLIC PANEL
Anion gap: 13 (ref 5–15)
BUN: 41 mg/dL — ABNORMAL HIGH (ref 8–23)
CO2: 36 mmol/L — ABNORMAL HIGH (ref 22–32)
Calcium: 8.9 mg/dL (ref 8.9–10.3)
Chloride: 88 mmol/L — ABNORMAL LOW (ref 98–111)
Creatinine, Ser: 1.07 mg/dL — ABNORMAL HIGH (ref 0.44–1.00)
GFR, Estimated: 58 mL/min — ABNORMAL LOW (ref >60)
Glucose, Bld: 104 mg/dL — ABNORMAL HIGH (ref 70–99)
Potassium: 3.9 mmol/L (ref 3.5–5.1)
Sodium: 137 mmol/L (ref 135–145)

## 2023-08-10 LAB — GLUCOSE, CAPILLARY
Glucose-Capillary: 79 mg/dL (ref 70–99)
Glucose-Capillary: 195 mg/dL — ABNORMAL HIGH (ref 70–99)
Glucose-Capillary: 301 mg/dL — ABNORMAL HIGH (ref 70–99)
Glucose-Capillary: 185 mg/dL — ABNORMAL HIGH (ref 70–99)
Glucose-Capillary: 206 mg/dL — ABNORMAL HIGH (ref 70–99)

## 2023-08-10 LAB — MAGNESIUM: Magnesium: 1.7 mg/dL (ref 1.7–2.4)

## 2023-08-10 MED ORDER — INSULIN GLARGINE-YFGN 100 UNIT/ML ~~LOC~~ SOLN
30.0000 [IU] | Freq: Every day | SUBCUTANEOUS | Status: DC
  Administered 2023-08-10 – 2023-08-14 (×5): 30 [IU] via SUBCUTANEOUS
  Filled 2023-08-10 (×5): qty 0.3

## 2023-08-10 MED ORDER — MAGNESIUM SULFATE 2 GM/50ML IV SOLN
2.0000 g | Freq: Once | INTRAVENOUS | Status: AC
  Administered 2023-08-10: 2 g via INTRAVENOUS
  Filled 2023-08-10: qty 50

## 2023-08-10 MED ORDER — SPIRONOLACTONE 12.5 MG HALF TABLET
12.5000 mg | ORAL_TABLET | Freq: Every day | ORAL | Status: DC
  Administered 2023-08-10 – 2023-08-12 (×3): 12.5 mg via ORAL
  Filled 2023-08-10 (×4): qty 1

## 2023-08-10 MED ORDER — INSULIN ASPART 100 UNIT/ML IJ SOLN: 15.0000 [IU] | Freq: Every day | INTRAMUSCULAR | Status: DC

## 2023-08-10 MED ORDER — TORSEMIDE 20 MG PO TABS
60.0000 mg | ORAL_TABLET | Freq: Two times a day (BID) | ORAL | Status: DC
  Administered 2023-08-10 – 2023-08-11 (×3): 60 mg via ORAL
  Filled 2023-08-10 (×3): qty 3

## 2023-08-10 MED ORDER — INSULIN ASPART 100 UNIT/ML IJ SOLN
20.0000 [IU] | Freq: Every day | INTRAMUSCULAR | Status: DC
  Administered 2023-08-10 – 2023-08-13 (×4): 20 [IU] via SUBCUTANEOUS

## 2023-08-10 MED ORDER — POTASSIUM CHLORIDE 20 MEQ PO PACK
40.0000 meq | PACK | Freq: Once | ORAL | Status: AC
  Administered 2023-08-10: 40 meq via ORAL
  Filled 2023-08-10: qty 2

## 2023-08-10 NOTE — Progress Notes (Signed)
Blood glucose 79 this morning before eating breakfast. Rechecked blood sugar-195 after eating.

## 2023-08-10 NOTE — Progress Notes (Addendum)
HD#3 SUBJECTIVE:   Feeling better today.    OBJECTIVE:  Vital Signs: Vitals:   08/10/23 0638 08/10/23 0639 08/10/23 0640 08/10/23 0641  BP:      Pulse: 69 67 68 69  Resp:      Temp:      TempSrc:      SpO2: 97% 93% 90% 94%  Weight:      Height:       Supplemental O2: Nasal Cannula SpO2: 94 % O2 Flow Rate (L/min): 9 L/min  Filed Weights   08/08/23 1058 08/09/23 0350 08/10/23 0404  Weight: 101.4 kg 98.4 kg 97.3 kg    Intake/Output Summary (Last 24 hours) at 08/10/2023 0738 Last data filed at 08/10/2023 0636 Gross per 24 hour  Intake 233.43 ml  Output 1800 ml  Net -1566.57 ml   Net IO Since Admission: -4,824.11 mL [08/10/23 0738]  Physical Exam: Physical Exam Cardiovascular:     Rate and Rhythm: Normal rate and regular rhythm.     Heart sounds: No murmur heard.    No friction rub. No gallop.  Pulmonary:     Effort: No respiratory distress.     Breath sounds: Wheezing present. No decreased breath sounds, rhonchi or rales.  Abdominal:     General: Abdomen is protuberant. Bowel sounds are normal. There is distension (improved).     Palpations: Abdomen is soft. There is no shifting dullness or fluid wave.     Tenderness: There is no abdominal tenderness. There is guarding. There is no rebound.  Musculoskeletal:     Right lower leg: Edema (improved) present.     Left lower leg: Edema (improved) present.  Skin:    Capillary Refill: Capillary refill takes less than 2 seconds.  Neurological:     Mental Status: She is alert.     Patient Lines/Drains/Airways Status     Active Line/Drains/Airways     Name Placement date Placement time Site Days   Peripheral IV 08/06/23 20 G Right Antecubital 08/06/23  0830  Antecubital  1            ASSESSMENT/PLAN:  Assessment: Principal Problem:   CHF exacerbation (HCC) Active Problems:   Type 2 diabetes mellitus with moderate nonproliferative diabetic retinopathy (HCC)   COPD (chronic obstructive pulmonary  disease) (HCC)   Acute on chronic combined systolic and diastolic CHF (congestive heart failure) (HCC)   Sinoatrial node dysfunction (HCC)   Paroxysmal atrial fibrillation (HCC)   COVID-19 virus infection   Acute on chronic respiratory failure with hypoxia (HCC)   Demand ischemia of myocardium   Acute respiratory failure due to COVID-19 [facility])   Plan:  Acute on chronic respiratory failure with hypoxia (HCC) COPD exacerbation  CHF exacerbation Acute on Chronic HFpEF (EF 50-60%)  Weight trending down -1.7 L yesterday, Weight down 214 today, (223.5 lbs at admission). Cr improving 1.07 today. Wheezing improved in bilateral lungs. O2 still not at baseline of 3L (9L this AM). Will continue to diurese and treat for COPD exacerbation. Approaching end of diuresis today as bicarb is slightly increased. Will transition to PO torsemide. - cw Breo Ellipta - cw Umeclidinium bromide - cw Albuterol via neb every 6 hours as needed - sp Azithromycin 500mg  3 days - cw Prednisone 40mg  x 5 days (day 3 today)  -PO Torsemide 60 mg BID - Strict I/O - Daily weights - BMP daily -Magnesium tomorrow - continue holding outpatient antihypertensives and beta-blockers  Type 2 diabetes mellitus with moderate nonproliferative diabetic retinopathy (HCC) Historically  poorly controlled. Now on prednisone. Glucose in the evening was 79. Will lower basal dose. - Glargine 30 units daily - Novolog 14 units at  breakfast and lunch, 20 units with dinner. - Resistant SSI -Hypoglycemia precautions   Sinoatrial node dysfunction (HCC) Historically avoided beta-blockers in this person.  No bradycardia today.    Paroxysmal atrial fibrillation (HCC) Sinus rhythm today.  Continue outpatient anticoagulation. - Apixaban 5 mg twice daily   COVID-19 virus infection Likely from prior covid infection (last hospitalization).    Level of care: progressive Diet: Regular with 1500 mL fluid restriction IVF: N/A VTE: Therapeutic  apixaban Code: Full Surrogate: Daughter [name]   Signature: Kenedi Cilia Alexander-Savino,MD  Internal Medicine Resident, PGY-1 [facility]  Pager: [phone] 7:38 AM, 08/10/2023   Please contact the on call pager after 5 pm and on weekends at [phone].

## 2023-08-10 NOTE — Plan of Care (Signed)

## 2023-08-10 NOTE — Plan of Care (Signed)
  Problem: Coping: Goal: Ability to adjust to condition or change in health will improve Outcome: Progressing   

## 2023-08-10 NOTE — Progress Notes (Signed)
Heart Failure Stewardship Pharmacist Progress Note   PCP: [name], MD PCP-Cardiologist: Donato Schultz, MD    HPI:  67 yo F with PMH of COPD, oxygen dependent, CHF (EF 20-25% in 2015, recovered to 55-60%), CAD, HLD, GERD, T2DM, and CVA.    Admitted in Nov 2023 with acute on chronic CHF exacerbation. Diuresed w/ IV Lasix and treated w/ abx, steroids and nebs. Echo showed normal EF 55-60% and normal RV. After diureses, was transitioned to PO Lasix. Also placed on Entresto.   She was seen in HF TOC 10/2022. Jardiance increased to 10 mg daily and spironlactone added. She was seen back in [facility] 11/2022 and spironolactone was increased to 25 mg daily. She was later seen at [facility] on 1/30 and lasix was decreased to 80 mg AM and 40 mg PM.   Admitted 01/2023 with acute on chronic CHF exacerbation. ECHO 3/17 showed LVEF 60-65%. Diuresed with IV lasix 120 mg, metolazone, and diamox. Discharged on Entresto, spironolactone, amlodipine, and lasix. [name] held due to UTI.    Admitted 05/2023 with a/c CHF. CXR showed pulmonary congestion. BNP elevated. ECHO 7/24 showed LVEF 50-60%, G3DD, no RWMA, RV normal, moderately elevated PA pressure. Spironolactone with held at discharge.  She was seen again in HF TOC 06/2023. Volume status trending up. She was instructed to increase torsemide to 40 mg AM and 20 mg PM. Spironolactone was restarted.   Admitted 06/2023 with COPD exacerbation due to COVID-19 infection. She was discharged on 9/4.   Presented back to the ED on 9/13 when family found her unresponsive and hypoxic at home. NRB initiated by EMS and was more alert in the ED. Reported shortness of breath, productive cough, and weight gain. CXR with hazy bibasilar airspace opacities and cardiomegaly. ECHO 9/15 showed LVEF 50-55%, global hypokinesis, G3DD, RV normal, moderate to severe TR and worsening pulmonary HTN.   She reports she was only taking the torsemide prior to admission. Unclear if she was  taking this once or twice daily. When asked about this she answered differently each time. She reports not taking the spironolactone because she wasn't sure if she could be taking this with being sick. Reports eating high sodium containing foods.   Current HF Medications: Diuretic: torsemide 60 mg BID  Prior to admission HF Medications: Diuretic: furosemide 80 mg daily AND torsemide 40 mg BID ACE/ARB/ARNI: Entresto 24/26 mg BID *reports not taking spironolactone (was confused if she should be taking it or not)  Pertinent Lab Values: Serum creatinine 1.07, BUN 41, Potassium 3.9, Sodium 137, BNP 320.4, Magnesium 1.7  Vital Signs: Weight: 214 lbs (admission weight: 220 lbs) Blood pressure: 160-170/60s  Heart rate: 60-70s  I/O: net -1.5L yesterday; net -4.7L since admission  Medication Assistance / Insurance Benefits Check: Does the patient have prescription insurance?  Yes Type of insurance plan: [place] Medicaid  Outpatient Pharmacy:  [facility] the patient willing to use [facility] TOC pharmacy at discharge? Yes Is the patient willing to transition their outpatient pharmacy to utilize a [facility] outpatient pharmacy?   No    Assessment: 1. Acute on chronic HFimpEF (LVEF 50-55%). NYHA class II symptoms. - Agree with transitioning to torsemide 60 mg BID. Script was written for furosemide and she was instructed to continue taking torsemide on her last discharge. Encourage resuming torsemide BID at discharge. Strict I/Os and daily weights. Keep K>4 and Mg>2.  - GDMT held with AKI, creatinine is improving. Consider resuming spironolactone 12.5 mg daily  Plan: 1) Medication changes recommended at this time: - Restart spironolactone 12.5 mg daily - Stop furosemide at discharge; continue torsemide BID  2) Patient assistance: - None pending, has [place] Medicaid   3)  Education  - Patient has been educated on current HF medications and potential additions to HF  medication regimen - Patient verbalizes understanding that over the next few months, these medication doses may change and more medications may be added to optimize HF regimen - Patient has been educated on basic disease state pathophysiology and goals of therapy   Sharen Hones, PharmD, BCPS Heart Failure Stewardship Pharmacist Phone [phone]

## 2023-08-10 NOTE — TOC Initial Note (Signed)
Transition of Care [facility]) - Initial/Assessment Note    Patient Details  Name: Alicia [name] MRN: [id number] Date of Birth: 09-22-1956  Transition [facility]) CM/SW Contact:    Leone Haven, RN Phone Number: 08/10/2023, 12:34 PM  Clinical Narrative:                 From home with grand daughter and grand son, has PCP and insurance on file, states she is active with [facility], NCM confirmed with Kandee Keen, he states she has HHRN, HHPT in place at this time , she has rollator, walker, home oxygen with [employer] 4 liters, and a cane.  States family member  grand son or grand daughter will transport her home at [employer] and family is support system, [facility] [address]   Pta ambulatory with walker.   Expected Discharge Plan: Home w Home Health Services Barriers to Discharge: Continued Medical Work up   Patient Goals and CMS Choice Patient states their goals for this hospitalization and ongoing recovery are:: return home CMS Medicare.gov Compare Post Acute Care list provided to:: Patient Choice offered to / list presented to : Patient      Expected Discharge Plan and Services In-house Referral: NA Discharge Planning Services: CM Consult Post Acute Care Choice: Resumption of Svcs/PTA Provider Living arrangements for the past 2 months: Single Family Home                 DME Arranged: N/A DME Agency: NA       HH Arranged: RN, PT, OT HH Agency: [facility] Date [facility] Agency Contacted: 08/10/23 Time HH Agency Contacted: 1233 Representative spoke with at [facility] Agency: Kandee Keen  Prior Living Arrangements/Services Living arrangements for the past 2 months: Single Family Home Lives with:: Adult Children (grandson and grand daughter) Patient language and need for interpreter reviewed:: Yes Do you feel safe going back to the place where you live?: Yes      Need for Family Participation in Patient Care: Yes (Comment) Care giver support system in place?: Yes  (comment) Current home services: DME (rollator, walker, oxygen with [employer] 4 liters, cane) Criminal Activity/Legal Involvement Pertinent to Current Situation/Hospitalization: No - Comment as needed  Activities of Daily Living Home Assistive Devices/Equipment: None (However pt stated that she walks by holding onto furniture to support herself when ambulating) ADL Screening (condition at time of admission) Patient's cognitive ability adequate to safely complete daily activities?: Yes Is the patient deaf or have difficulty hearing?: No Does the patient have difficulty seeing, even when wearing glasses/contacts?: No Does the patient have difficulty concentrating, remembering, or making decisions?: No Patient able to express need for assistance with ADLs?: Yes Does the patient have difficulty dressing or bathing?: Yes Independently performs ADLs?: No Communication: Independent Dressing (OT): Needs assistance Is this a change from baseline?: Pre-admission baseline Grooming: Needs assistance Is this a change from baseline?: Pre-admission baseline Feeding: Needs assistance Is this a change from baseline?: Pre-admission baseline Bathing: Needs assistance Is this a change from baseline?: Pre-admission baseline Toileting: Needs assistance Is this a change from baseline?: Pre-admission baseline In/Out Bed: Needs assistance Is this a change from baseline?: Pre-admission baseline Walks in Home: Needs assistance Is this a change from baseline?: Pre-admission baseline Does the patient have difficulty walking or climbing stairs?: Yes Weakness of Legs: Both Weakness of Arms/Hands: None  Permission Sought/Granted Permission sought to share information with : Case Manager Permission granted to share information with : Yes, Verbal Permission  Granted     Permission granted to share info w AGENCY: [facility]        Emotional Assessment   Attitude/Demeanor/Rapport: Engaged Affect (typically  observed): Appropriate Orientation: : Oriented to Self, Oriented to Place, Oriented to  Time, Oriented to Situation Alcohol / Substance Use: Not Applicable Psych Involvement: No (comment)  Admission diagnosis:  COPD exacerbation (HCC) [J44.1] CHF exacerbation (HCC) [I50.9] Left leg swelling [M79.89] Acute respiratory failure due to COVID-19 (HCC) [U07.1, J96.00] Patient Active Problem List   Diagnosis Date Noted   Demand ischemia of myocardium 08/07/2023   Acute respiratory failure due to COVID-19 (HCC) 08/07/2023   CHF exacerbation (HCC) 08/06/2023   Acute on chronic respiratory failure with hypoxia (HCC) 07/21/2023   COVID-19 virus infection 07/19/2023   Pneumonia 07/19/2023   HAP (hospital-acquired pneumonia) 07/14/2023   Paroxysmal atrial fibrillation (HCC) 07/08/2023   Heart failure with recovered ejection fraction (HFrecEF) (HCC) 06/15/2023   Candidal vulvovaginitis 05/26/2023   Abdominal wall skin ulcer (HCC) 04/14/2023   Hearing loss of right ear due to cerumen impaction 03/31/2023   Chronic anemia 02/09/2023   Encounter for screening for lung cancer 05/13/2020   Nocturnal hypoxemia 12/30/2018   Sinoatrial node dysfunction (HCC) 10/17/2018   Acute on chronic combined systolic and diastolic CHF (congestive heart failure) (HCC) 10/15/2018   Chronic respiratory failure with hypoxia, on home oxygen therapy (HCC) 10/14/2018   Aortic atherosclerosis (HCC) 09/24/2017   Gastroesophageal reflux disease 09/24/2017   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 09/24/2017   Seasonal allergic rhinitis due to pollen 09/24/2017   Healthcare maintenance 09/24/2017   Obesity (BMI 30.0-34.9) 09/24/2017   Hyperlipidemia    Uterine prolapse 02/12/2016   Coronary artery disease involving native coronary artery of native heart without angina pectoris 11/09/2014   COPD (chronic obstructive pulmonary disease) (HCC) 02/18/2014   Type 2 diabetes mellitus with moderate nonproliferative  diabetic retinopathy (HCC) 10/15/2013   Essential hypertension 10/15/2013   PCP:  [name], MD [facility]   [phone] [place], [place] - [address] [place] [address] Phone: [phone] Fax: [phone]  [facility] [address] [place] [address] Phone: [phone] Fax: [phone]     Social Determinants of Health (SDOH) Social History: SDOH Screenings   Food Insecurity: No Food Insecurity (08/06/2023)  Housing: Patient Unable To Answer (08/07/2023)  Transportation Needs: No Transportation Needs (08/07/2023)  Utilities: Not At Risk (08/07/2023)  Alcohol Screen: Low Risk  (08/09/2023)  Depression (PHQ2-9): Low Risk  (07/07/2023)  Financial Resource Strain: Low Risk  (08/09/2023)  Physical Activity: Inactive (05/19/2023)  Social Connections: Socially Isolated (05/19/2023)  Stress: No Stress Concern Present (05/19/2023)  Tobacco Use: Medium Risk (08/06/2023)   SDOH Interventions: Alcohol Usage Interventions: Intervention Not Indicated (Score <7) Financial Strain Interventions: Intervention Not Indicated   Readmission Risk Interventions    08/10/2023   12:29 PM 07/28/2023   11:44 AM  Readmission Risk Prevention Plan  Transportation Screening Complete Complete  Medication Review Oceanographer) Complete Complete  PCP or Specialist appointment within 3-5 days of discharge Complete   HRI or Home Care Consult Complete Complete  SW Recovery Care/Counseling Consult  Complete  Palliative Care Screening Not Applicable Not Applicable  Skilled Nursing Facility Not Applicable Not Applicable

## 2023-08-11 ENCOUNTER — Encounter: Payer: Self-pay | Admitting: Pharmacist

## 2023-08-11 DIAGNOSIS — I5033 Acute on chronic diastolic (congestive) heart failure: Secondary | ICD-10-CM | POA: Diagnosis not present

## 2023-08-11 DIAGNOSIS — J9621 Acute and chronic respiratory failure with hypoxia: Secondary | ICD-10-CM | POA: Diagnosis not present

## 2023-08-11 DIAGNOSIS — J441 Chronic obstructive pulmonary disease with (acute) exacerbation: Secondary | ICD-10-CM | POA: Diagnosis not present

## 2023-08-11 LAB — GLUCOSE, CAPILLARY
Glucose-Capillary: 89 mg/dL (ref 70–99)
Glucose-Capillary: 166 mg/dL — ABNORMAL HIGH (ref 70–99)
Glucose-Capillary: 265 mg/dL — ABNORMAL HIGH (ref 70–99)
Glucose-Capillary: 295 mg/dL — ABNORMAL HIGH (ref 70–99)

## 2023-08-11 LAB — MAGNESIUM: Magnesium: 1.9 mg/dL (ref 1.7–2.4)

## 2023-08-11 MED ORDER — MAGNESIUM SULFATE IN D5W 1-5 GM/100ML-% IV SOLN
1.0000 g | Freq: Once | INTRAVENOUS | Status: AC
  Administered 2023-08-11: 1 g via INTRAVENOUS
  Filled 2023-08-11: qty 100

## 2023-08-11 MED ORDER — ACETAZOLAMIDE SODIUM 500 MG IJ SOLR
500.0000 mg | Freq: Once | INTRAMUSCULAR | Status: AC
  Administered 2023-08-11: 500 mg via INTRAVENOUS
  Filled 2023-08-11: qty 500

## 2023-08-11 MED ORDER — FUROSEMIDE 10 MG/ML IJ SOLN
120.0000 mg | Freq: Once | INTRAVENOUS | Status: AC
  Administered 2023-08-11: 120 mg via INTRAVENOUS
  Filled 2023-08-11: qty 10

## 2023-08-11 MED ORDER — SACUBITRIL-VALSARTAN 24-26 MG PO TABS
1.0000 | ORAL_TABLET | Freq: Two times a day (BID) | ORAL | Status: DC
  Administered 2023-08-11 – 2023-08-12 (×4): 1 via ORAL
  Filled 2023-08-11 (×4): qty 1

## 2023-08-11 NOTE — Hospital Course (Addendum)
Alicia [name] presented on 9/12 after being found to have trouble waking from her sleep with an O2 level of 72%. She was confused and became alert on her way to the hospital after EMS put her on the NRB mask. She did not tolerate the NRB mask and then was put on 13L on HFNC. Of note, she was just recently admitted to our service for COVID and subsequent pneumonia. Patient states that she felt well for a few days before having onset exertional dyspnea, barely walking from the bed to the bathroom.  On Exam, she was wheezing coarse crackles. Coughing until she became nauseous and had vomited the evening prior.Abdomen was soft and distended, with suprapubic tenderness on palpation.   She was thought to have a CHF exacerbation and aggressively diuresed with Furosemide 120mg  TID. Cr was 1.33. Weight was 220.9 lbs. Upon re-evaluation the next morning, she was still requiring 8L O2 (4-5L at baseline) and had more wheezing than crackles. She did not seem hypervolemic with no JVD, no leg edema.Was having some urine output, but described subrapubic fullness and dysuria, and feeling like she could not urinate like usual. UA was not concerning for a UTI. She did have some scant sputum production but worsening cough, and hazy bibasilar airspace opacities in Cxr with increased O2 demand. With her mild improvement with lasix ON, no wet crackles or fluid overload, she was treated for COPD exacerbation. She received Breo Ellipta, umeclidinium and albuterol (PRN). Prednisone 40mg  x 5 days and Azathioprine x3 days was started. Today is her fourth day of Prednisone.*** She had tested positive for COVID but this was thought to be residual from her prior admission.  The following day she had minor improvement, and it was noted that she had increased in weight to 228.4lbs. Her standing dry weight after her discharge on 06/28/2023 was 210.5. The weight gain in such a small period of time along with an increase in creatinine to 2.25 shifted  gears to CHF exacerbation. On exam there was no JVD, trace pedal edema, and no crackles. She did have abdominal distention, which she did endorse having increased in size. She was aggressively diuresed with IV Furosemide 120mg  BID and continued on the inhalers, prednisone and azithromycin. She had 3.2L of output the following day. Weight has continued to decrease and is currently at *** Her creatinine is 1.18 and has transitioned successfuly to torsemide 60mg  BID ***  Ms. Cherne is to continue on:   Torsemide 60mg  BID  GDMT:  Spironolactone 12.5 mg daily   Hydrochlorothiazide  25mg  daily was held due to increased Cr. She was not on any beta blockers at discharge due to history of sinoatrial node dysfunction.    She continued on apixaban 5mg  daily for paroxysmal Afib.   For her Diabetes she is to continue on:   30 units of Long Acting Insulin 14 units Novolog at breakfast and lunch   20 units Novolog at bedtime  Trulicity 1.5mg  once a week was stopped because the patient was having trouble obtaining it outpatient.   Sinoatrial node dysfunction (HCC) Historically avoided beta-blockers in this person.  No bradycardia today.    Paroxysmal atrial fibrillation (HCC) Sinus rhythm today.  Continue outpatient anticoagulation. - Apixaban 5 mg twice daily   COVID-19 virus infection Likely from prior covid infection (last hospitalization).

## 2023-08-11 NOTE — Progress Notes (Signed)
Heart Failure Stewardship Pharmacist Progress Note   PCP: [name], MD PCP-Cardiologist: Donato Schultz, MD    HPI:  67 yo F with PMH of COPD, oxygen dependent, CHF (EF 20-25% in 2015, recovered to 55-60%), CAD, HLD, GERD, T2DM, and CVA.    Admitted in Nov 2023 with acute on chronic CHF exacerbation. Diuresed w/ IV Lasix and treated w/ abx, steroids and nebs. Echo showed normal EF 55-60% and normal RV. After diureses, was transitioned to PO Lasix. Also placed on Entresto.   She was seen in HF TOC 10/2022. Jardiance increased to 10 mg daily and spironlactone added. She was seen back in [facility] 11/2022 and spironolactone was increased to 25 mg daily. She was later seen at [facility] on 1/30 and lasix was decreased to 80 mg AM and 40 mg PM.   Admitted 01/2023 with acute on chronic CHF exacerbation. ECHO 3/17 showed LVEF 60-65%. Diuresed with IV lasix 120 mg, metolazone, and diamox. Discharged on Entresto, spironolactone, amlodipine, and lasix. [name] held due to UTI.    Admitted 05/2023 with a/c CHF. CXR showed pulmonary congestion. BNP elevated. ECHO 7/24 showed LVEF 50-60%, G3DD, no RWMA, RV normal, moderately elevated PA pressure. Spironolactone with held at discharge.  She was seen again in HF TOC 06/2023. Volume status trending up. She was instructed to increase torsemide to 40 mg AM and 20 mg PM. Spironolactone was restarted.   Admitted 06/2023 with COPD exacerbation due to COVID-19 infection. She was discharged on 9/4.   Presented back to the ED on 9/13 when family found her unresponsive and hypoxic at home. NRB initiated by EMS and was more alert in the ED. Reported shortness of breath, productive cough, and weight gain. CXR with hazy bibasilar airspace opacities and cardiomegaly. ECHO 9/15 showed LVEF 50-55%, global hypokinesis, G3DD, RV normal, moderate to severe TR and worsening pulmonary HTN.   She reports she was only taking the torsemide prior to admission. Unclear if she was  taking this once or twice daily. When asked about this she answered differently each time. She reports not taking the spironolactone because she wasn't sure if she could be taking this with being sick. Reports eating high sodium containing foods.   Current HF Medications: Diuretic: torsemide 60 mg BID MRA: spironolactone 12.5 mg daily  Prior to admission HF Medications: Diuretic: furosemide 80 mg daily AND torsemide 40 mg BID ACE/ARB/ARNI: Entresto 24/26 mg BID *reports not taking spironolactone (was confused if she should be taking it or not)  Pertinent Lab Values: Serum creatinine 1.18, BUN 45, Potassium 4.0, Sodium 137, BNP 320.4, Magnesium 1.9  Vital Signs: Weight: 215 lbs (admission weight: 220 lbs) Blood pressure: 170/60s  Heart rate: 60-70s  I/O: net -1.8L yesterday; net -6.3L since admission  Medication Assistance / Insurance Benefits Check: Does the patient have prescription insurance?  Yes Type of insurance plan: [place] Medicaid  Outpatient Pharmacy:  [facility] Is the patient willing to use [facility] TOC pharmacy at discharge? Yes Is the patient willing to transition their outpatient pharmacy to utilize a [facility] outpatient pharmacy?   No    Assessment: 1. Acute on chronic HFimpEF (LVEF 50-55%). NYHA class II symptoms. - Continue torsemide 60 mg BID. Script was written for furosemide and she was instructed to continue taking torsemide on her last discharge. Encourage resuming torsemide BID at discharge. Strict I/Os and daily weights. Keep K>4 and Mg>2.  - GDMT held with AKI, creatinine is improving. - Consider resuming Sherryll Burger  24/26 mg BID - Continue spironolactone 12.5 mg daily   Plan: 1) Medication changes recommended at this time: - Restart Entresto 24/26 mg BID - Stop furosemide at discharge; continue torsemide BID  2) Patient assistance: - None pending, has  Medicaid   3)  Education  - Patient has been educated on current HF  medications and potential additions to HF medication regimen - Patient verbalizes understanding that over the next few months, these medication doses may change and more medications may be added to optimize HF regimen - Patient has been educated on basic disease state pathophysiology and goals of therapy   Sharen Hones, PharmD, BCPS Heart Failure Stewardship Pharmacist Phone [phone]

## 2023-08-11 NOTE — Progress Notes (Signed)
Adding acetazolamide and switching to  IV furosemide. Weight is up, and she most likely will need more diuresis.   Discussed with Dr. Antony Contras.

## 2023-08-11 NOTE — Progress Notes (Signed)
HD#4 SUBJECTIVE:   Feeling better today. Spironolactone restarted last night.   OBJECTIVE:  Vital Signs: Vitals:   08/11/23 0456 08/11/23 0744 08/11/23 1059 08/11/23 1100  BP: (!) 177/69 (!) 175/74  (!) 164/75  Pulse: 69 65  73  Resp: 17 20  (!) 21  Temp: 98.8 F (37.1 C) 98 F (36.7 C)  97.9 F (36.6 C)  TempSrc: Oral Oral Oral Oral  SpO2: 92% 96%  92%  Weight: 97.6 kg     Height:       Supplemental O2: Nasal Cannula SpO2: 92 % O2 Flow Rate (L/min): 8 L/min (up to 15L during session)  Filed Weights   08/09/23 0350 08/10/23 0404 08/11/23 0456  Weight: 98.4 kg 97.3 kg 97.6 kg    Intake/Output Summary (Last 24 hours) at 08/11/2023 1526 Last data filed at 08/11/2023 1500 Gross per 24 hour  Intake 897 ml  Output 2850 ml  Net -1953 ml   Net IO Since Admission: -6,627.11 mL [08/11/23 1526]  Physical Exam: Physical Exam Cardiovascular:     Rate and Rhythm: Normal rate and regular rhythm.     Heart sounds: No murmur heard.    No friction rub. No gallop.  Pulmonary:     Effort: No respiratory distress.     Breath sounds: Wheezing (improved from yesterday) present. No decreased breath sounds, rhonchi or rales.  Abdominal:     General: Abdomen is protuberant. Bowel sounds are normal. There is distension (improved).     Palpations: Abdomen is soft. There is no shifting dullness or fluid wave.     Tenderness: There is no abdominal tenderness. There is no guarding or rebound.  Musculoskeletal:     Right lower leg: No edema.     Left lower leg: No edema.  Skin:    Capillary Refill: Capillary refill takes less than 2 seconds.  Neurological:     Mental Status: She is alert.    Patient Lines/Drains/Airways Status     Active Line/Drains/Airways     Name Placement date Placement time Site Days   Peripheral IV 08/06/23 20 G Right Antecubital 08/06/23  0830  Antecubital  1            ASSESSMENT/PLAN:  Assessment: Principal Problem:   CHF exacerbation  (HCC) Active Problems:   Type 2 diabetes mellitus with moderate nonproliferative diabetic retinopathy (HCC)   COPD (chronic obstructive pulmonary disease) (HCC)   Acute on chronic combined systolic and diastolic CHF (congestive heart failure) (HCC)   Sinoatrial node dysfunction (HCC)   Paroxysmal atrial fibrillation (HCC)   COVID-19 virus infection   Acute on chronic respiratory failure with hypoxia (HCC)   Demand ischemia of myocardium   Acute respiratory failure due to COVID-19 [facility])   Plan:  Acute on chronic respiratory failure with hypoxia (HCC) COPD exacerbation  CHF exacerbation Acute on Chronic HFpEF (EF 50-60%)  Weight trending down -1.6 L yesterday, despite transition to PO torsemide. Today -1.2L. Weight is slightly increased to 215.1, (223.5 lbs at admission). Cr improving 1.18 with borderline increased bicarb. May be approaching end but output remains in the net negative today. Wheezing improved in bilateral lungs, very mild today. Tried to ambulate her with PT and unfortunately desaturated once she sat to 69%. Required 8L of O2. Wonder if this persistent dyspnea is residual from her covid infection. However, she will not be able to go home if requiring this amount of oxygen and desatting at this point. Appreciate PT's assistance. - cw Breo Ellipta -  cw Umeclidinium bromide - cw Albuterol via neb every 6 hours as needed - sp Azithromycin 500mg  3 days - cw Prednisone 40mg  x 5 days (day 4 today)  - cw PO Torsemide 60 mg BID - Strict I/O - Daily weights - BMP daily - Magnesium  - continue holding outpatient beta-blockers  Type 2 diabetes mellitus with moderate nonproliferative diabetic retinopathy (HCC) Historically poorly controlled. Now on prednisone. Glucose in the evening was 89.  - cw Glargine 30 units daily - Novolog 14 units at  breakfast and lunch, 20 units with dinner. - Resistant SSI -Hypoglycemia precautions   Sinoatrial node dysfunction (HCC) Historically  avoided beta-blockers in this person.  No bradycardia today.    Paroxysmal atrial fibrillation (HCC) Sinus rhythm today.  Continue outpatient anticoagulation. - Apixaban 5 mg twice daily   COVID-19 virus infection Likely from prior covid infection (last hospitalization).    Level of care: progressive Diet: Regular with 1500 mL fluid restriction IVF: N/A VTE: Therapeutic apixaban Code: Full Surrogate: Daughter Alicia [name]   Signature: Sirena Riddle Alexander-Savino,MD  Internal Medicine Resident, PGY-1 [facility]  Pager: [phone] 3:26 PM, 08/11/2023   Please contact the on call pager after 5 pm and on weekends at [phone].

## 2023-08-11 NOTE — Evaluation (Signed)
Physical Therapy Evaluation Patient Details Name: Alicia [name] MRN: [id number] DOB: [birth year] Today's Date: 08/11/2023  History of Present Illness  Pt is 67 yo presenting to [facility] ED via EMS due to shortness of breath. Pt family found pt unresponsive at home with snoring respiration. O2 sats 72% at home. Pt has some increased swelling in LE. Pt was found to have COVID. PMH: COPD, CHF, DM, CAD and recent pneumonia.  Clinical Impression  Pt is presenting close to baseline level of functioning. Currently pt is CGA for sit to stand and short distance gait. Pt is limited due to respirator status at this time with O2 sats plummeting after mobility with increased time to get back up over 88%. Due to pt current functional status, PLOF, home set up and available assistance at home recommending skilled physical therapy services 3x/weekly on discharge from acute care hospital setting in order to decrease risk for falls, injury, re-hospitalization and to improve pt activity tolerance.         If plan is discharge home, recommend the following: A little help with bathing/dressing/bathroom;Assistance with cooking/housework;Help with stairs or ramp for entrance;Assist for transportation   Can travel by private vehicle   Yes    Equipment Recommendations None recommended by PT     Functional Status Assessment Patient has had a recent decline in their functional status and demonstrates the ability to make significant improvements in function in a reasonable and predictable amount of time.     Precautions / Restrictions Precautions Precautions: Fall Precaution Comments: COVID, monitor HR and sats Restrictions Weight Bearing Restrictions: No      Mobility  Bed Mobility Overal bed mobility: Modified Independent Bed Mobility: Supine to Sit     Supine to sit: Modified independent (Device/Increase time), HOB elevated     General bed mobility comments: increased time    Transfers Overall transfer  level: Needs assistance Equipment used: Rolling walker (2 wheels) Transfers: Sit to/from Stand, Bed to chair/wheelchair/BSC Sit to Stand: Min assist   Step pivot transfers: Contact guard assist       General transfer comment: Supervision for O2 sats and stability. O2 sats down to 74% on 8L o2 via [place] with transfer from EOB to [facility]. >2 min to increase ti 88% on 15 L    Ambulation/Gait Ambulation/Gait assistance: Contact guard assist Gait Distance (Feet): 30 Feet Assistive device: Rolling walker (2 wheels) Gait Pattern/deviations: Step-through pattern, Decreased stride length Gait velocity: Decreased cadence. Gait velocity interpretation: <1.31 ft/sec, indicative of household ambulator   General Gait Details: Slow steady gait. O2 sats remained over 90% with short distance gait on 10L o2 via [place]. Pt then desatted to 69% after gait >2 min to get back to 88% on 15 L O2 via [place]. RN made aware. Pt encouraged to purse lip breathe.    Merchant [employer] officer mobility:  (not attempted today due to O2 sats)      Balance Overall balance assessment: Needs assistance Sitting-balance support: No upper extremity supported, Feet supported Sitting balance-Leahy Scale: Good Sitting balance - Comments: sitting EOB   Standing balance support: During functional activity, Bilateral upper extremity supported Standing balance-Leahy Scale: Fair Standing balance comment: with RW support           Pertinent Vitals/Pain Pain Assessment Pain Assessment: No/denies pain    Home Living Family/patient expects to be discharged to:: Private residence Living Arrangements: Children (daughter and 2 grand kids 45 and 33) Available Help at Discharge: Family;Available PRN/intermittently Type of  Home: House Home Access: Stairs to enter   [employer] of Steps: 1 walls to hold onto Alternate Level Stairs-Number of Steps: 14 Home Layout: Two level;Bed/bath upstairs Home  Equipment: Rollator (4 wheels);Cane - single point;Grab bars - tub/shower;Rolling Walker (2 wheels);Shower seat Additional Comments: daughter works and grandchildren in school, home alone    Prior Function Prior Level of Function : Independent/Modified Independent             Mobility Comments: crawls up the stairs at home, uses RW, was receiving HHPT since last d/c with [facility] ADLs Comments: Independent in ADLs and IADLs     Extremity/Trunk Assessment   Upper Extremity Assessment Upper Extremity Assessment: Generalized weakness    Lower Extremity Assessment Lower Extremity Assessment: Generalized weakness    Cervical / Trunk Assessment Cervical / Trunk Assessment: Normal  Communication   Communication Communication: Hearing impairment  Cognition Arousal: Alert Behavior During Therapy: WFL for tasks assessed/performed Overall Cognitive Status: Within Functional Limits for tasks assessed          General Comments General comments (skin integrity, edema, etc.): BP 163/68        Assessment/Plan    PT Assessment Patient needs continued PT services  PT Problem List Decreased strength;Decreased range of motion;Decreased coordination;Cardiopulmonary status limiting activity;Decreased activity tolerance;Decreased knowledge of use of DME;Decreased balance;Decreased safety awareness;Decreased knowledge of precautions;Decreased mobility       PT Treatment Interventions DME instruction;Balance training;Gait training;Neuromuscular re-education;Stair training;Functional mobility training;Patient/family education;Therapeutic activities;Therapeutic exercise;Manual techniques    PT Goals (Current goals can be found in the Care Plan section)  Acute Rehab PT Goals Patient Stated Goal: To return home and walk PT Goal Formulation: With patient Time For Goal Achievement: 08/25/23 Potential to Achieve Goals: Good    Frequency Min 1X/week        AM-PAC PT "6 Clicks" Mobility   Outcome Measure Help needed turning from your back to your side while in a flat bed without using bedrails?: None Help needed moving from lying on your back to sitting on the side of a flat bed without using bedrails?: None Help needed moving to and from a bed to a chair (including a wheelchair)?: A Little Help needed standing up from a chair using your arms (e.g., wheelchair or bedside chair)?: A Little Help needed to walk in hospital room?: A Little Help needed climbing 3-5 steps with a railing? : A Lot 6 Click Score: 19    End of Session Equipment Utilized During Treatment: Oxygen;Gait belt Activity Tolerance: Patient tolerated treatment well;Other (comment) (limited by O2 sats) Patient left: with call bell/phone within reach;in chair Nurse Communication: Mobility status PT Visit Diagnosis: Other abnormalities of gait and mobility (R26.89);Difficulty in walking, not elsewhere classified (R26.2);Muscle weakness (generalized) (M62.81)    Time: 1478-2956 PT Time Calculation (min) (ACUTE ONLY): 44 min   Charges:   PT Evaluation $PT Eval Low Complexity: 1 Low PT Treatments $Therapeutic Activity: 23-37 mins PT General Charges $$ ACUTE PT VISIT: 1 Visit         Harrel Carina, DPT, CLT  Acute Rehabilitation Services Office: [phone] (Secure chat preferred)   Claudia Desanctis 08/11/2023, 12:19 PM

## 2023-08-11 NOTE — Evaluation (Signed)
Occupational Therapy Evaluation Patient Details Name: Alicia [name] MRN: [id number] DOB: 1955-12-01 Today's Date: 08/11/2023   History of Present Illness Pt is 67 yo presenting to [facility] ED via EMS due to shortness of breath. Pt family found pt unresponsive at home with snoring respiration. O2 sats 72% at home. Pt has some increased swelling in LE. Pt was found to have COVID. PMH: COPD, CHF, DM, CAD and recent pneumonia.   Clinical Impression   At baseline, pt completes ADLs Independent to Mod I and functional mobility with a RW with Mod I. Pt now presents with decreased activity tolerance, decreased dynamic standing balance during functional tasks, decreased B UE strength, and decreased safety and independence with ADLs and functional transfers/mobility. Pt currently with decreased O2 sat ito the upper 80s on 9L continuous O2 through HFNC when standing during functional tasks and transfers. Pt's O2 sat recovers quickly to >/ 92% with standing or seated rest break. Pt currently demonstrates ability to complete ADLs Mod I to Min assist and functional tranfers with Contact guard to Min assist. Pt will benefit from acute skilled OT services to address deficits outlined below and increase safety and independence with functional tasks. Post acute discharge, pt will benefit from continued skilled OT services in the home to maximize rehab potential.       If plan is discharge home, recommend the following: A little help with walking and/or transfers;A little help with bathing/dressing/bathroom;Assistance with cooking/housework;Assist for transportation;Help with stairs or ramp for entrance    Functional Status Assessment  Patient has had a recent decline in their functional status and demonstrates the ability to make significant improvements in function in a reasonable and predictable amount of time.  Equipment Recommendations  None recommended by OT (Pt already has needed equipment)    Recommendations  for Other Services       Precautions / Restrictions Precautions Precautions: Fall Precaution Comments: COVID, monitor HR and sats Restrictions Weight Bearing Restrictions: No      Mobility Bed Mobility Overal bed mobility: Modified Independent             General bed mobility comments: Mod I per PT report earlier this day. Pt sitting in recliner at beginning and end of OT eval.    Transfers Overall transfer level: Needs assistance Equipment used: Rolling walker (2 wheels) Transfers: Sit to/from Stand, Bed to chair/wheelchair/BSC Sit to Stand: Min assist     Step pivot transfers: Contact guard assist (with RW)     General transfer comment: O2 sat down to 86% on 9L continuous O2 through HFNC during standing/transfers. O2 sat quickly recovered to >/92% with standing or sitting rest break.      Balance Overall balance assessment: Needs assistance Sitting-balance support: No upper extremity supported, Feet supported Sitting balance-Leahy Scale: Good     Standing balance support: Single extremity supported, Bilateral upper extremity supported, During functional activity Standing balance-Leahy Scale: Fair Standing balance comment: with RW support needed in dynamic standing                           ADL either performed or assessed with clinical judgement   ADL Overall ADL's : Needs assistance/impaired Eating/Feeding: Modified independent;Sitting   Grooming: Set up;Sitting   Upper Body Bathing: Set up;Sitting Upper Body Bathing Details (indicate cue type and reason): with increased time and rest breaks needed; in simulated task Lower Body Bathing: Contact guard assist;Sit to/from stand Lower Body Bathing Details (indicate cue  type and reason): with increased time and rest breaks needed; in simulated task Upper Body Dressing : Set up;Sitting   Lower Body Dressing: Contact guard assist;Sit to/from stand Lower Body Dressing Details (indicate cue type and  reason): with increased time and rest breaks needed; in simulated task Toilet Transfer: Contact guard assist;BSC/3in1;Rolling walker (2 wheels) (step-pivot)   Toileting- Clothing Manipulation and Hygiene: Contact guard assist;Minimal assistance;Sit to/from stand Toileting - Clothing Manipulation Details (indicate cue type and reason): with increased time and rest breaks needed; in simulated task       General ADL Comments: Pt with decreased activity tolerance and O2 sat dropping into the upper 80s during functional tasks in standing.     Vision Baseline Vision/History: 0 No visual deficits (Pt reports she is due for an annual vision exam and thinks she may need readers) Ability to See in Adequate Light: 0 Adequate Patient Visual Report: No change from baseline       Perception         Praxis         Pertinent Vitals/Pain Pain Assessment Pain Assessment: No/denies pain Pain Intervention(s): Monitored during session     Extremity/Trunk Assessment Upper Extremity Assessment Upper Extremity Assessment: Right hand dominant;Generalized weakness   Lower Extremity Assessment Lower Extremity Assessment: Defer to PT evaluation   Cervical / Trunk Assessment Cervical / Trunk Assessment: Normal   Communication Communication Communication: Hearing impairment (HOH) Cueing Techniques: Visual cues;Tactile cues (occasional)   Cognition Arousal: Alert Behavior During Therapy: WFL for tasks assessed/performed Overall Cognitive Status: Within Functional Limits for tasks assessed                                 General Comments: AAOx4 and pleasant throughout session. Pt demonstrates good safety awareness and problem solving during session.     General Comments  O2 sat down to 86% on 9L continuous O2 through HFNC during standing/transfers. O2 sat quickly recovered to >/92% with standing or sitting rest break. All other VSS throughout session.    Exercises     Shoulder  Instructions      Home Living Family/patient expects to be discharged to:: Private residence Living Arrangements: Children (daughter and grandchildren) Available Help at Discharge: Family;Available PRN/intermittently Type of Home: House Home Access: Stairs to enter [employer] of Steps: 1 walls to hold onto Entrance Stairs-Rails: Right;Left Home Layout: Two level;Bed/bath upstairs Alternate Level Stairs-Number of Steps: 14 Alternate Level Stairs-Rails: Right;Left;Can reach both Bathroom Shower/Tub: Chief Strategy Officer: Standard Bathroom Accessibility: Yes   Home Equipment: Rollator (4 wheels);Cane - single point;Grab bars - tub/shower;Rolling Walker (2 wheels);Shower seat;Tub bench          Prior Functioning/Environment Prior Level of Function : Independent/Modified Independent             Mobility Comments: Pt reports she crawls up the stairs at home, uses RW, was receiving HHPT since last d/c with [facility] ADLs Comments: Independent ito Mod I with ADLs and IADLs        OT Problem List: Decreased strength;Decreased activity tolerance;Impaired balance (sitting and/or standing);Cardiopulmonary status limiting activity      OT Treatment/Interventions: Self-care/ADL training;Therapeutic exercise;Energy conservation;DME and/or AE instruction;Therapeutic activities;Balance training;Patient/family education    OT Goals(Current goals can be found in the care plan section) Acute Rehab OT Goals Patient Stated Goal: To return home and for her O2 sat to stop dropping OT Goal Formulation: With patient Time  For Goal Achievement: 08/25/23 Potential to Achieve Goals: Good ADL Goals Pt Will Perform Grooming: standing;with modified independence Pt Will Perform Lower Body Bathing: with modified independence;sit to/from stand Pt Will Transfer to Toilet: with modified independence;ambulating;regular height toilet;grab bars (with least restrictive AD) Pt Will  Perform Toileting - Clothing Manipulation and hygiene: with modified independence;sit to/from stand Pt/caregiver will Perform Home Exercise Program: Increased strength;Both right and left upper extremity;With theraband;Independently;With written HEP provided (Increased activity tolerance) Additional ADL Goal #1: Patient will demonstrate ability to Independently state 4 energy conservation strategies to increase safety and independence with functional tasks in the home.  OT Frequency: Min 1X/week    Co-evaluation              AM-PAC OT "6 Clicks" Daily Activity     Outcome Measure Help from another person eating meals?: None Help from another person taking care of personal grooming?: A Little Help from another person toileting, which includes using toliet, bedpan, or urinal?: A Little Help from another person bathing (including washing, rinsing, drying)?: A Little Help from another person to put on and taking off regular upper body clothing?: A Little Help from another person to put on and taking off regular lower body clothing?: A Little 6 Click Score: 19   End of Session Equipment Utilized During Treatment: Rolling walker (2 wheels);Oxygen Nurse Communication: Mobility status  Activity Tolerance: Patient tolerated treatment well;Other (comment) (Pt limited by current cardiopulmonary status) Patient left: in chair;with call bell/phone within reach  OT Visit Diagnosis: Unsteadiness on feet (R26.81);Other abnormalities of gait and mobility (R26.89);Muscle weakness (generalized) (M62.81);Other (comment) (Decreased activity tolerance)                Time: 1433-1500 OT Time Calculation (min): 27 min Charges:  OT General Charges $OT Visit: 1 Visit OT Evaluation $OT Eval Low Complexity: 1 Low  [address] M., OTR/L, MA Acute Rehab [phone]   Lendon Colonel 08/11/2023, 3:27 PM

## 2023-08-11 NOTE — Plan of Care (Signed)
Problem: Coping: Goal: Level of anxiety will decrease Outcome: Progressing

## 2023-08-12 DIAGNOSIS — J441 Chronic obstructive pulmonary disease with (acute) exacerbation: Secondary | ICD-10-CM | POA: Diagnosis not present

## 2023-08-12 DIAGNOSIS — I5033 Acute on chronic diastolic (congestive) heart failure: Secondary | ICD-10-CM | POA: Diagnosis not present

## 2023-08-12 DIAGNOSIS — J9621 Acute and chronic respiratory failure with hypoxia: Secondary | ICD-10-CM | POA: Diagnosis not present

## 2023-08-12 LAB — RENAL FUNCTION PANEL
Albumin: 2.7 g/dL — ABNORMAL LOW (ref 3.5–5.0)
Anion gap: 11 (ref 5–15)
BUN: 48 mg/dL — ABNORMAL HIGH (ref 8–23)
CO2: 36 mmol/L — ABNORMAL HIGH (ref 22–32)
Calcium: 8.7 mg/dL — ABNORMAL LOW (ref 8.9–10.3)
Chloride: 89 mmol/L — ABNORMAL LOW (ref 98–111)
Creatinine, Ser: 1.07 mg/dL — ABNORMAL HIGH (ref 0.44–1.00)
GFR, Estimated: 58 mL/min — ABNORMAL LOW (ref >60)
Glucose, Bld: 226 mg/dL — ABNORMAL HIGH (ref 70–99)
Phosphorus: 4.5 mg/dL (ref 2.5–4.6)
Potassium: 3.9 mmol/L (ref 3.5–5.1)
Sodium: 136 mmol/L (ref 135–145)

## 2023-08-12 LAB — GLUCOSE, CAPILLARY
Glucose-Capillary: 157 mg/dL — ABNORMAL HIGH (ref 70–99)
Glucose-Capillary: 214 mg/dL — ABNORMAL HIGH (ref 70–99)
Glucose-Capillary: 270 mg/dL — ABNORMAL HIGH (ref 70–99)
Glucose-Capillary: 189 mg/dL — ABNORMAL HIGH (ref 70–99)

## 2023-08-12 MED ORDER — ACETAZOLAMIDE SODIUM 500 MG IJ SOLR
500.0000 mg | Freq: Once | INTRAMUSCULAR | Status: AC
  Administered 2023-08-12: 500 mg via INTRAVENOUS
  Filled 2023-08-12: qty 500

## 2023-08-12 MED ORDER — INSULIN ASPART 100 UNIT/ML IJ SOLN
16.0000 [IU] | Freq: Two times a day (BID) | INTRAMUSCULAR | Status: DC
  Administered 2023-08-12 – 2023-08-13 (×3): 16 [IU] via SUBCUTANEOUS

## 2023-08-12 MED ORDER — AMLODIPINE BESYLATE 10 MG PO TABS
10.0000 mg | ORAL_TABLET | Freq: Every day | ORAL | Status: DC
  Administered 2023-08-12: 10 mg via ORAL
  Filled 2023-08-12: qty 1

## 2023-08-12 MED ORDER — FUROSEMIDE 10 MG/ML IJ SOLN
120.0000 mg | Freq: Once | INTRAVENOUS | Status: AC
  Administered 2023-08-12: 120 mg via INTRAVENOUS
  Filled 2023-08-12: qty 10

## 2023-08-12 NOTE — Progress Notes (Addendum)
HD#5 SUBJECTIVE:   Feeling better today.    OBJECTIVE:  Vital Signs: Vitals:   08/12/23 0807 08/12/23 0833 08/12/23 0834 08/12/23 1110  BP: (!) 151/81   133/64  Pulse: 68   74  Resp: 17     Temp: 98.7 F (37.1 C)     TempSrc: Oral     SpO2: 100% 96% 100% 96%  Weight:      Height:       Supplemental O2: Nasal Cannula SpO2: 96 % O2 Flow Rate (L/min): 8 L/min  Filed Weights   08/10/23 0404 08/11/23 0456 08/12/23 0448  Weight: 97.3 kg 97.6 kg 94.5 kg    Intake/Output Summary (Last 24 hours) at 08/12/2023 1558 Last data filed at 08/12/2023 1437 Gross per 24 hour  Intake 10 ml  Output 1800 ml  Net -1790 ml   Net IO Since Admission: -8,417.11 mL [08/12/23 1558]  Physical Exam: Physical Exam Cardiovascular:     Rate and Rhythm: Normal rate and regular rhythm.     Heart sounds: No murmur heard.    No friction rub. No gallop.  Pulmonary:     Effort: No respiratory distress.     Breath sounds: Wheezing (improved from yesterday) present. No decreased breath sounds, rhonchi or rales.  Abdominal:     General: Abdomen is protuberant. Bowel sounds are normal. There is distension (improved).     Palpations: Abdomen is soft. There is no shifting dullness or fluid wave.     Tenderness: There is no abdominal tenderness. There is no guarding or rebound.  Musculoskeletal:     Right lower leg: No edema.     Left lower leg: No edema.  Skin:    Capillary Refill: Capillary refill takes less than 2 seconds.  Neurological:     Mental Status: She is alert.    Patient Lines/Drains/Airways Status     Active Line/Drains/Airways     Name Placement date Placement time Site Days   Peripheral IV 08/06/23 20 G Right Antecubital 08/06/23  0830  Antecubital  1            ASSESSMENT/PLAN:  Assessment: Principal Problem:   CHF exacerbation (HCC) Active Problems:   Type 2 diabetes mellitus with moderate nonproliferative diabetic retinopathy (HCC)   COPD (chronic obstructive  pulmonary disease) (HCC)   Acute on chronic combined systolic and diastolic CHF (congestive heart failure) (HCC)   Sinoatrial node dysfunction (HCC)   Paroxysmal atrial fibrillation (HCC)   COVID-19 virus infection   Acute on chronic respiratory failure with hypoxia (HCC)   Demand ischemia of myocardium   Acute respiratory failure due to COVID-19 [facility])  Ms. Kall is a 67 y.o. who has a PMH of COPD and CHF presenting with increased SOB and oxygen demand, admitted for an acute on chronic respiratory failure with hypoxia.   Plan:  Acute on chronic respiratory failure with hypoxia (HCC) COPD exacerbation  CHF exacerbation Acute on Chronic HFpEF (EF 50-60%)  Feels better than yesterday. -2.8 L today at 208 Lbs below her baseline. Cr stable and Bicarb stable at 36 with acetazolamide. Will continue to diurese today.  - cw Breo Ellipta - cw Umeclidinium bromide - cw Albuterol via neb every 6 hours as needed - sp Azithromycin 500mg  3 days - cw Prednisone 40mg  x 5 days (day 5 today)  - IV Furosemide 120mg  once - acetazolamide 500mg  daily - Strict I/O - Daily weights - BMP daily - Magnesium  -Replete electrolytes as needed - continue holding outpatient  beta-blockers -PT/OT  Hypertension  On amlodipine at home.  -Restart amlodipine.  Type 2 diabetes mellitus with moderate nonproliferative diabetic retinopathy (HCC) Historically poorly controlled. Last day of prednisone today.  - cw Glargine 30 units daily - Novolog 14 units at  breakfast and lunch, 20 units with dinner. - Resistant SSI -Hypoglycemia precautions   Sinoatrial node dysfunction (HCC) Historically avoided beta-blockers in this person.  No bradycardia today.    Paroxysmal atrial fibrillation (HCC) Sinus rhythm today.  Continue outpatient anticoagulation. - Apixaban 5 mg twice daily   COVID-19 virus infection Likely from prior covid infection (last hospitalization).    Level of care: progressive Diet: Regular  with 1500 mL fluid restriction IVF: N/A VTE: Therapeutic apixaban Code: Full Surrogate: Daughter [name]   Signature: Erasmus Bistline Alexander-Savino,MD  Internal Medicine Resident, PGY-1 [facility]  Pager: [phone] 3:58 PM, 08/12/2023   Please contact the on call pager after 5 pm and on weekends at [phone].

## 2023-08-12 NOTE — Progress Notes (Signed)
Heart Failure Stewardship Pharmacist Progress Note   PCP: [name], MD PCP-Cardiologist: Donato Schultz, MD    HPI:  67 yo F with PMH of COPD, oxygen dependent, CHF (EF 20-25% in 2015, recovered to 55-60%), CAD, HLD, GERD, T2DM, and CVA.    Admitted in Nov 2023 with acute on chronic CHF exacerbation. Diuresed w/ IV Lasix and treated w/ abx, steroids and nebs. Echo showed normal EF 55-60% and normal RV. After diureses, was transitioned to PO Lasix. Also placed on Entresto.   She was seen in HF TOC 10/2022. Jardiance increased to 10 mg daily and spironlactone added. She was seen back in [facility] 11/2022 and spironolactone was increased to 25 mg daily. She was later seen at [facility] on 1/30 and lasix was decreased to 80 mg AM and 40 mg PM.   Admitted 01/2023 with acute on chronic CHF exacerbation. ECHO 3/17 showed LVEF 60-65%. Diuresed with IV lasix 120 mg, metolazone, and diamox. Discharged on Entresto, spironolactone, amlodipine, and lasix. [name] held due to UTI.    Admitted 05/2023 with a/c CHF. CXR showed pulmonary congestion. BNP elevated. ECHO 7/24 showed LVEF 50-60%, G3DD, no RWMA, RV normal, moderately elevated PA pressure. Spironolactone with held at discharge.  She was seen again in HF TOC 06/2023. Volume status trending up. She was instructed to increase torsemide to 40 mg AM and 20 mg PM. Spironolactone was restarted.   Admitted 06/2023 with COPD exacerbation due to COVID-19 infection. She was discharged on 9/4.   Presented back to the ED on 9/13 when family found her unresponsive and hypoxic at home. NRB initiated by EMS and was more alert in the ED. Reported shortness of breath, productive cough, and weight gain. CXR with hazy bibasilar airspace opacities and cardiomegaly. ECHO 9/15 showed LVEF 50-55%, global hypokinesis, G3DD, RV normal, moderate to severe TR and worsening pulmonary HTN.   She reports she was only taking the torsemide prior to admission. Unclear if she was  taking this once or twice daily. When asked about this she answered differently each time. She reports not taking the spironolactone because she wasn't sure if she could be taking this with being sick. Reports eating high sodium containing foods.   Current HF Medications: Diuretic: furosemide 120 mg IV x 1 + diamox 500 mg IV x 1 ACE/ARB/ARNI: Entresto 24/26 mg BID MRA: spironolactone 12.5 mg daily  Prior to admission HF Medications: Diuretic: furosemide 80 mg daily AND torsemide 40 mg BID ACE/ARB/ARNI: Entresto 24/26 mg BID *reports not taking spironolactone (was confused if she should be taking it or not)  Pertinent Lab Values: Serum creatinine 1.07, BUN 48, Potassium 3.9, Sodium 136, BNP 320.4, Magnesium 1.9  Vital Signs: Weight: 208 lbs (admission weight: 220 lbs) Blood pressure: 180/60s  Heart rate: 50-60s  I/O: net -1.4L yesterday; net -8L since admission  Medication Assistance / Insurance Benefits Check: Does the patient have prescription insurance?  Yes Type of insurance plan: [place] Medicaid  Outpatient Pharmacy:  Prior to admission outpatient [facility] Is the patient willing to use [facility] TOC pharmacy at discharge? Yes Is the patient willing to transition their outpatient pharmacy to utilize a [facility] outpatient pharmacy?   No    Assessment: 1. Acute on chronic HFimpEF (LVEF 50-55%). NYHA class III symptoms. - Restarted IV lasix with desaturations with mobility yesterday. Additional furosemide 120 mg IV and diamox 500 mg x 1 today. Script was written for furosemide and she was instructed to continue taking torsemide on her last  discharge. Encourage resuming torsemide BID at discharge. Strict I/Os and daily weights. Keep K>4 and Mg>2.  - Consider increasing Entresto to 49/51 mg BID - Consider increasing to spironolactone 25 mg daily - Consider stopping amlodipine to allow for maximal GDMT   Plan: 1) Medication changes recommended at this time: - Stop amlodipine to  allow for maximal HF GDMT - Increase Entresto to 49/51 mg BID - Increase spironolactone to 25 mg daily - Stop furosemide at discharge; continue torsemide BID  2) Patient assistance: - None pending, has [place] Medicaid   3)  Education  - Patient has been educated on current HF medications and potential additions to HF medication regimen - Patient verbalizes understanding that over the next few months, these medication doses may change and more medications may be added to optimize HF regimen - Patient has been educated on basic disease state pathophysiology and goals of therapy   Sharen Hones, PharmD, BCPS Heart Failure Stewardship Pharmacist Phone [phone]

## 2023-08-12 NOTE — Progress Notes (Signed)
Physical Therapy Treatment Patient Details Name: Alicia [name] MRN: [id number] DOB: 05-27-1956 Today's Date: 08/12/2023   History of Present Illness Pt is 67 yo presenting to [facility] ED via EMS due to shortness of breath. Pt family found pt unresponsive at home with snoring respiration. O2 sats 72% at home. Pt has some increased swelling in LE. Pt was found to have COVID. PMH: COPD, CHF, DM, CAD and recent pneumonia.    PT Comments  Pt with continued increased O2 demands, limiting progress towards acute goals this session. On arrival SpO2 85%on 5L with pt at rest, increased to 8L with SpO2 increasing to 92%, desatting to low 80s with bed mobiilty, increased to 10L with pt able to maintain intermittently seated at EOB, pt desatting to 79% on 10L in standing with pt coughing and needing to sit, increased time and return to supine to recover to 92% in supine on 10L, RN aware. Pt requiring grossly CGA to light min A for bed mobility and transfers with good safety awareness. Educated pt on importance of upright sitting and IS use and frequency with pt verbaklizing understanding. Pt continues to benefit from skilled PT services to progress toward functional mobility goals.      If plan is discharge home, recommend the following: A little help with bathing/dressing/bathroom;Assistance with cooking/housework;Help with stairs or ramp for entrance;Assist for transportation   Can travel by private vehicle     Yes  Equipment Recommendations  None recommended by PT    Recommendations for Other Services       Precautions / Restrictions Precautions Precautions: Fall Precaution Comments: COVID, monitor HR and sats Restrictions Weight Bearing Restrictions: No     Mobility  Bed Mobility Overal bed mobility: Modified Independent             General bed mobility comments: slightly increased time and use of rails    Transfers Overall transfer level: Needs assistance Equipment used: Rolling walker  (2 wheels) Transfers: Sit to/from Stand, Bed to chair/wheelchair/BSC Sit to Stand: Min assist           General transfer comment: light min A to power up, pt O2 sats down to 82% on10L with pt coughing and needing to return to sitting    Ambulation/Gait               General Gait Details: unable to progress due to incresed O2 demand, pt unable to raise O2 sats >87% in sitting on 10L and pt with multiple coughing spells   Stairs             Wheelchair Mobility     Tilt Bed    Modified Rankin (Stroke Patients Only)       Balance Overall balance assessment: Needs assistance Sitting-balance support: No upper extremity supported, Feet supported Sitting balance-Leahy Scale: Good     Standing balance support: Single extremity supported, Bilateral upper extremity supported, During functional activity Standing balance-Leahy Scale: Fair Standing balance comment: with RW support needed in dynamic standing                            Cognition Arousal: Alert Behavior During Therapy: WFL for tasks assessed/performed Overall Cognitive Status: Within Functional Limits for tasks assessed                                 General Comments: AAOx4 and pleasant throughout  session. Pt demonstrates good safety awareness and problem solving during session.        Exercises      General Comments General comments (skin integrity, edema, etc.): SpO2 85% on arrival in room with pt at rest on 5L, increased to 8L with SpO2 increasing to 92%, desatting to low 80s with bed mobiilty, increased to 10L pt able to maintain intermittently seated at EOB, pt desatting to 79% on 10L in standing with pt coughing and needing to sit, increased time and return to supine to recover to 92% in supine on 10L. RN aware      Pertinent Vitals/Pain Pain Assessment Pain Assessment: No/denies pain Pain Intervention(s): Monitored during session    Home Living                           Prior Function            PT Goals (current goals can now be found in the care plan section) Acute Rehab PT Goals Patient Stated Goal: to go home PT Goal Formulation: With patient Time For Goal Achievement: 08/25/23 Progress towards PT goals: Not progressing toward goals - comment    Frequency    Min 1X/week      PT Plan Current plan remains appropriate    Co-evaluation              AM-PAC PT "6 Clicks" Mobility   Outcome Measure  Help needed turning from your back to your side while in a flat bed without using bedrails?: None Help needed moving from lying on your back to sitting on the side of a flat bed without using bedrails?: None Help needed moving to and from a bed to a chair (including a wheelchair)?: A Little Help needed standing up from a chair using your arms (e.g., wheelchair or bedside chair)?: A Little Help needed to walk in hospital room?: A Little Help needed climbing 3-5 steps with a railing? : A Lot 6 Click Score: 19    End of Session Equipment Utilized During Treatment: Oxygen Activity Tolerance: Treatment limited secondary to medical complications (Comment) (increased O2 demands) Patient left: in bed;with call bell/phone within reach Nurse Communication: Mobility status;Other (comment) (O2) PT Visit Diagnosis: Other abnormalities of gait and mobility (R26.89);Difficulty in walking, not elsewhere classified (R26.2);Muscle weakness (generalized) (M62.81)     Time: 1610-9604 PT Time Calculation (min) (ACUTE ONLY): 35 min  Charges:    $Therapeutic Activity: 23-37 mins PT General Charges $$ ACUTE PT VISIT: 1 Visit                     Krina Mraz R. PTA Acute Rehabilitation Services Office: [phone]   Catalina Antigua 08/12/2023, 4:49 PM

## 2023-08-12 NOTE — Plan of Care (Signed)
Pt educated regarding fluid intake and maintaining fluid balance. Pt states understanding

## 2023-08-12 NOTE — Plan of Care (Signed)

## 2023-08-12 NOTE — Progress Notes (Signed)
Internal Medicine Attending Note:  I have seen and evaluated this patient and I have discussed the plan of care with the house staff. Please see their note for complete details. I concur with their findings.  Unable to attest progress note - will ask resident to fix in am.   Reymundo Poll, MD 08/12/2023, 8:10 PM

## 2023-08-13 DIAGNOSIS — J441 Chronic obstructive pulmonary disease with (acute) exacerbation: Secondary | ICD-10-CM | POA: Diagnosis not present

## 2023-08-13 DIAGNOSIS — J9621 Acute and chronic respiratory failure with hypoxia: Secondary | ICD-10-CM | POA: Diagnosis not present

## 2023-08-13 DIAGNOSIS — I5033 Acute on chronic diastolic (congestive) heart failure: Secondary | ICD-10-CM | POA: Diagnosis not present

## 2023-08-13 LAB — GLUCOSE, CAPILLARY
Glucose-Capillary: 163 mg/dL — ABNORMAL HIGH (ref 70–99)
Glucose-Capillary: 281 mg/dL — ABNORMAL HIGH (ref 70–99)
Glucose-Capillary: 210 mg/dL — ABNORMAL HIGH (ref 70–99)
Glucose-Capillary: 164 mg/dL — ABNORMAL HIGH (ref 70–99)

## 2023-08-13 LAB — RENAL FUNCTION PANEL
Albumin: 2.9 g/dL — ABNORMAL LOW (ref 3.5–5.0)
Anion gap: 10 (ref 5–15)
BUN: 50 mg/dL — ABNORMAL HIGH (ref 8–23)
CO2: 34 mmol/L — ABNORMAL HIGH (ref 22–32)
Calcium: 8.9 mg/dL (ref 8.9–10.3)
Chloride: 92 mmol/L — ABNORMAL LOW (ref 98–111)
Creatinine, Ser: 1.22 mg/dL — ABNORMAL HIGH (ref 0.44–1.00)
GFR, Estimated: 49 mL/min — ABNORMAL LOW (ref >60)
Glucose, Bld: 147 mg/dL — ABNORMAL HIGH (ref 70–99)
Phosphorus: 4.8 mg/dL — ABNORMAL HIGH (ref 2.5–4.6)
Potassium: 3.6 mmol/L (ref 3.5–5.1)
Sodium: 136 mmol/L (ref 135–145)

## 2023-08-13 LAB — MAGNESIUM: Magnesium: 1.7 mg/dL (ref 1.7–2.4)

## 2023-08-13 MED ORDER — INSULIN ASPART 100 UNIT/ML IJ SOLN
14.0000 [IU] | Freq: Every day | INTRAMUSCULAR | Status: DC
  Administered 2023-08-14: 14 [IU] via SUBCUTANEOUS

## 2023-08-13 MED ORDER — POTASSIUM CHLORIDE CRYS ER 20 MEQ PO TBCR
40.0000 meq | EXTENDED_RELEASE_TABLET | Freq: Once | ORAL | Status: AC
  Administered 2023-08-13: 40 meq via ORAL
  Filled 2023-08-13: qty 2

## 2023-08-13 MED ORDER — FUROSEMIDE 10 MG/ML IJ SOLN
120.0000 mg | Freq: Once | INTRAVENOUS | Status: DC
  Filled 2023-08-13: qty 12

## 2023-08-13 MED ORDER — SACUBITRIL-VALSARTAN 49-51 MG PO TABS
1.0000 | ORAL_TABLET | Freq: Two times a day (BID) | ORAL | Status: DC
  Administered 2023-08-13 – 2023-08-17 (×9): 1 via ORAL
  Filled 2023-08-13 (×9): qty 1

## 2023-08-13 MED ORDER — SPIRONOLACTONE 25 MG PO TABS
25.0000 mg | ORAL_TABLET | Freq: Every day | ORAL | Status: DC
  Administered 2023-08-13 – 2023-08-17 (×5): 25 mg via ORAL
  Filled 2023-08-13 (×5): qty 1

## 2023-08-13 MED ORDER — MAGNESIUM SULFATE 2 GM/50ML IV SOLN
2.0000 g | Freq: Once | INTRAVENOUS | Status: AC
  Administered 2023-08-13: 2 g via INTRAVENOUS
  Filled 2023-08-13: qty 50

## 2023-08-13 MED ORDER — ACETAZOLAMIDE SODIUM 500 MG IJ SOLR
500.0000 mg | Freq: Once | INTRAMUSCULAR | Status: AC
  Administered 2023-08-13: 500 mg via INTRAVENOUS
  Filled 2023-08-13: qty 500

## 2023-08-13 MED ORDER — INSULIN ASPART 100 UNIT/ML IJ SOLN
20.0000 [IU] | Freq: Every day | INTRAMUSCULAR | Status: DC
  Administered 2023-08-14: 20 [IU] via SUBCUTANEOUS

## 2023-08-13 NOTE — Progress Notes (Signed)
Given increased oxygen needs with 8L to maintain 80% saturation and a Cr of 1.22 and Bicarb of 34, she will continue IV furosemide for today (with acetazolamide)   Case discussed with Dr. Antony Contras.

## 2023-08-13 NOTE — Progress Notes (Signed)
Occupational Therapy Treatment Patient Details Name: Alicia [name] MRN: [id number] DOB: 10-11-1956 Today's Date: 08/13/2023   History of present illness Pt is 67 yo presenting to [facility] ED via EMS due to shortness of breath. Pt family found pt unresponsive at home with snoring respiration. O2 sats 72% at home. Pt has some increased swelling in LE. Pt was found to have COVID. PMH: COPD, CHF, DM, CAD and recent pneumonia.   OT comments  OT educated pt in techniques for increased safety and independence with ADLs and functional transfers/mobility, including training in energy conservation techniques and monitoring O2 sats. Pt currently demonstrates ability to complete UB ADLs Mod I to Set up, LB ADLs with Contact guard assist in sit/stand, and functional mobility/transfers with a RW with Contact guard assist. Pt not requiring physical assist for LB ADLs but requires Contact guard assist for safety due to pt presenting this session with O2 de-sat to 77% on 5L continuous O2 through nasal cannula after approx. 4 minutes in standing during functional tasks. Pt's O2 sat recovered to >/92% quickly with seated rest break and pursed lip breathing. All other VSS throughout session. Pt participated well in session and is making progress toward goals. Pt will benefit from continued acute skilled OT services to address deficits outlined below and increase safety and independence with functional tasks. Post acute discharge, pt will benefit from continued skilled OT services in the home.       If plan is discharge home, recommend the following:  A little help with walking and/or transfers;A little help with bathing/dressing/bathroom;Assistance with cooking/housework;Assist for transportation;Help with stairs or ramp for entrance   Equipment Recommendations  None recommended by OT (Pt already has needed equipment)    Recommendations for Other Services      Precautions / Restrictions Precautions Precautions:  Fall Precaution Comments: monitor HR and sats Restrictions Weight Bearing Restrictions: No       Mobility Bed Mobility               General bed mobility comments: Pt sitting in recliner at beginning and end of session.    Transfers Overall transfer level: Needs assistance Equipment used: Rolling walker (2 wheels) Transfers: Sit to/from Stand, Bed to chair/wheelchair/BSC Sit to Stand: Contact guard assist     Step pivot transfers: Contact guard assist           Balance Overall balance assessment: Needs assistance Sitting-balance support: No upper extremity supported, Feet supported Sitting balance-Leahy Scale: Good     Standing balance support: Single extremity supported, Bilateral upper extremity supported, During functional activity, Reliant on assistive device for balance Standing balance-Leahy Scale: Fair Standing balance comment: with RW support needed in dynamic standing                           ADL either performed or assessed with clinical judgement   ADL Overall ADL's : Needs assistance/impaired Eating/Feeding: Modified independent;Sitting   Grooming: Wash/dry hands;Wash/dry face;Contact guard assist;Standing           Upper Body Dressing : Set up;Sitting   Lower Body Dressing: Contact guard assist Lower Body Dressing Details (indicate cue type and reason): no physical assist needed but recommend CGA for safety secondary to decreased O2 sat in standing Toilet Transfer: Contact guard assist;BSC/3in1;Rolling walker (2 wheels) (step-pivot) Toilet Transfer Details (indicate cue type and reason): no physical assist needed but recommend CGA for safety secondary to decreased O2 sat in standing Toileting- Clothing  Manipulation and Hygiene: Contact guard assist;Sit to/from stand;Sitting/lateral lean Toileting - Clothing Manipulation Details (indicate cue type and reason): no physical assist needed but recommend CGA for safety secondary to  decreased O2 sat in standing     Functional mobility during ADLs: Contact guard assist;Rolling walker (2 wheels) General ADL Comments: Pt with decreased activity tolerance and O2 sat quickly dropping to 77% on 5L continuous O2 during functional tasks in standing approx. 4 minutes this session. O2 sat recovered quickly to >/ 92% with seated rest break and pursed lip breathing. OT educated pt in energy conservation strategies and monitoring O2 sat to increase pt safety and indepenednce with functional tasks in the home with pt verbalizing understanding.    Extremity/Trunk Assessment Upper Extremity Assessment Upper Extremity Assessment: Generalized weakness   Lower Extremity Assessment Lower Extremity Assessment: Defer to PT evaluation        Vision       Perception     Praxis      Cognition Arousal: Alert Behavior During Therapy: [facility] for tasks assessed/performed Overall Cognitive Status: Within Functional Limits for tasks assessed                                 General Comments: AAOx4 and pleasant throughout session. Pt demonstrates good safety awareness and problem solving during session.        Exercises      Shoulder Instructions       General Comments      Pertinent Vitals/ Pain       Pain Assessment Pain Assessment: No/denies pain Pain Intervention(s): Monitored during session  Home Living                                          Prior Functioning/Environment              Frequency  Min 1X/week        Progress Toward Goals  OT Goals(current goals can now be found in the care plan section)  Progress towards OT goals: Progressing toward goals  Acute Rehab OT Goals Patient Stated Goal: To return home with family  Plan Discharge plan remains appropriate    Co-evaluation                 AM-PAC OT "6 Clicks" Daily Activity     Outcome Measure   Help from another person eating meals?: None Help from  another person taking care of personal grooming?: A Little Help from another person toileting, which includes using toliet, bedpan, or urinal?: A Little Help from another person bathing (including washing, rinsing, drying)?: A Little Help from another person to put on and taking off regular upper body clothing?: A Little Help from another person to put on and taking off regular lower body clothing?: A Little 6 Click Score: 19    End of Session Equipment Utilized During Treatment: Gait belt;Rolling walker (2 wheels);Oxygen  OT Visit Diagnosis: Unsteadiness on feet (R26.81);Other abnormalities of gait and mobility (R26.89);Muscle weakness (generalized) (M62.81);Other (comment) (Decreased activity tolerance)   Activity Tolerance Patient tolerated treatment well;Other (comment) (Pt limited by current caridopulmonary status)   Patient Left in chair;with call bell/phone within reach   Nurse Communication Mobility status;Other (comment) (Pt de-sat to 77% on 5L O2 after 4 min of functional activities in standing.)  Time: 8295-6213 OT Time Calculation (min): 37 min  Charges: OT General Charges $OT Visit: 1 Visit OT Treatments $Self Care/Home Management : 23-37 mins  Ghazal Pevey "Orson Eva., OTR/L, MA Acute Rehab [phone]   Lendon Colonel 08/13/2023, 5:51 PM

## 2023-08-13 NOTE — Progress Notes (Addendum)
HD#6 SUBJECTIVE:   Feeling better today.    OBJECTIVE:  Vital Signs: Vitals:   08/13/23 0849 08/13/23 0954 08/13/23 1050 08/13/23 1133  BP:  (!) 129/56  (!) 115/58  Pulse:  67  67  Resp:  18  16  Temp:  98.2 F (36.8 C)  98.2 F (36.8 C)  TempSrc:  Oral  Oral  SpO2: 95% 93% 91% 90%  Weight:      Height:       Supplemental O2: Nasal Cannula SpO2: 90 % O2 Flow Rate (L/min): 5 L/min  Filed Weights   08/11/23 0456 08/12/23 0448 08/13/23 0447  Weight: 97.6 kg 94.5 kg 95.3 kg    Intake/Output Summary (Last 24 hours) at 08/13/2023 1448 Last data filed at 08/12/2023 2035 Gross per 24 hour  Intake 240 ml  Output 450 ml  Net -210 ml   Net IO Since Admission: -8,627.11 mL [08/13/23 1448]  Physical Exam:  Physical Exam Cardiovascular:     Rate and Rhythm: Normal rate and regular rhythm.     Heart sounds: No murmur heard.    No friction rub. No gallop.  Pulmonary:     Effort: No respiratory distress.     Breath sounds: No decreased breath sounds, wheezing, rhonchi or rales.  Abdominal:     General: Abdomen is protuberant. Bowel sounds are normal. There is no distension.     Palpations: Abdomen is soft. There is no shifting dullness or fluid wave.     Tenderness: There is no abdominal tenderness. There is no guarding or rebound.  Musculoskeletal:     Right lower leg: No edema.     Left lower leg: No edema.  Skin:    Capillary Refill: Capillary refill takes less than 2 seconds.  Neurological:     Mental Status: Alicia [name] is alert.    Patient Lines/Drains/Airways Status     Active Line/Drains/Airways     Name Placement date Placement time Site Days   Peripheral IV 08/06/23 20 G Right Antecubital 08/06/23  0830  Antecubital  1            ASSESSMENT/PLAN:  Assessment: Principal Problem:   CHF exacerbation (HCC) Active Problems:   Type 2 diabetes mellitus with moderate nonproliferative diabetic retinopathy (HCC)   COPD (chronic obstructive pulmonary disease)  (HCC)   Acute on chronic combined systolic and diastolic CHF (congestive heart failure) (HCC)   Sinoatrial node dysfunction (HCC)   Paroxysmal atrial fibrillation (HCC)   COVID-19 virus infection   Acute on chronic respiratory failure with hypoxia (HCC)   Demand ischemia of myocardium   Acute respiratory failure due to COVID-19 [facility])  Alicia [name] is a 67 y.o. who has a PMH of COPD and CHF presenting with increased SOB and oxygen demand, admitted for an acute on chronic respiratory failure with hypoxia.   Plan:  Acute on chronic respiratory failure with hypoxia (HCC) COPD exacerbation  CHF exacerbation Acute on Chronic HFpEF (EF 50-60%) . HX of COVID followed by pna With an increasing creatinine and decreased urine output along with being at her dry weight (210 lbs), we have reached maximal diuresis for her CHF at this point. Alicia [name] was also treated for COPD exacerbation and completed Azithromycin for three days and prednisone for 5. Lungs are clear to auscultation. Unsure what is driving her SOB at the moment. Alicia [name] is status post covid complicated by pneumonia. Could be that this is sequale from COVID and her recent infection. Alicia [name] is requiring 8L to maintain  an oxygenation of 80% on ambulation today.  - cw Breo Ellipta - cw Umeclidinium bromide - cw Albuterol via neb every 6 hours as needed - sp Azithromycin 500mg  3 days - sp Prednisone 40mg  x 5 days (yesterday was last dose) - Transition to Po Torsemide 60mg  daily  - Strict I/O - Daily weights -Measure and Replete electrolytes as needed -CBC tomorrow - continue holding outpatient beta-blockers - PT/OT  Hypertension  Remains hypertensive. Will increase Entresto to 49-50 BID and Spironolactone 25mg  daily. Dc amlodipine.   Type 2 diabetes mellitus with moderate nonproliferative diabetic retinopathy (HCC) Historically poorly controlled. Last day of prednisone yesterday. Has remained on regular diet to mimic what Alicia [name] is on at home. S/p  prednisone Alicia [name] is at 163 fasting in the AM and still spiking for lunch. Will assess dinnertime tomorrow. Will increased Novolog to 20 units at lunchtime.  - cw Glargine 30 units daily - Novolog 14 units at  breakfast,  20 units at lunch and dinner. - Resistant SSI -Hypoglycemia precautions   Sinoatrial node dysfunction (HCC) Historically avoided beta-blockers in this person.  No bradycardia today.    Paroxysmal atrial fibrillation (HCC) Sinus rhythm today.  Continue outpatient anticoagulation. - Apixaban 5 mg twice daily   Level of care: progressive Diet: Regular with 1500 mL fluid restriction IVF: N/A VTE: Therapeutic apixaban Code: Full Surrogate: Daughter Alicia [name]   Signature: Marshae Azam Alexander-Savino,MD  Internal Medicine Resident, PGY-1 [facility]  Pager: [phone] 2:48 PM, 08/13/2023   Please contact the on call pager after 5 pm and on weekends at [phone].

## 2023-08-13 NOTE — Plan of Care (Signed)

## 2023-08-13 NOTE — Progress Notes (Signed)
Patient began complaining of her IV site discomfort as soon as lasix was hung. IV lasix stopped. RN tried twice to get a new IV site without success. IV team consulted. HS RN notified that patient did not receive her IV lasix and will need the dose to be given as soon as patient gets a working PIV. Elnita Maxwell, RN

## 2023-08-13 NOTE — Progress Notes (Signed)
Heart Failure Stewardship Pharmacist Progress Note   PCP: [name], MD PCP-Cardiologist: Donato Schultz, MD    HPI:  67 yo F with PMH of COPD, oxygen dependent, CHF (EF 20-25% in 2015, recovered to 55-60%), CAD, HLD, GERD, T2DM, and CVA.    Admitted in Nov 2023 with acute on chronic CHF exacerbation. Diuresed w/ IV Lasix and treated w/ abx, steroids and nebs. Echo showed normal EF 55-60% and normal RV. After diureses, was transitioned to PO Lasix. Also placed on Entresto.   She was seen in HF TOC 10/2022. Jardiance increased to 10 mg daily and spironlactone added. She was seen back in [facility] 11/2022 and spironolactone was increased to 25 mg daily. She was later seen at [facility] on 1/30 and lasix was decreased to 80 mg AM and 40 mg PM.   Admitted 01/2023 with acute on chronic CHF exacerbation. ECHO 3/17 showed LVEF 60-65%. Diuresed with IV lasix 120 mg, metolazone, and diamox. Discharged on Entresto, spironolactone, amlodipine, and lasix. [name] held due to UTI.    Admitted 05/2023 with a/c CHF. CXR showed pulmonary congestion. BNP elevated. ECHO 7/24 showed LVEF 50-60%, G3DD, no RWMA, RV normal, moderately elevated PA pressure. Spironolactone with held at discharge.  She was seen again in HF TOC 06/2023. Volume status trending up. She was instructed to increase torsemide to 40 mg AM and 20 mg PM. Spironolactone was restarted.   Admitted 06/2023 with COPD exacerbation due to COVID-19 infection. She was discharged on 9/4.   Presented back to the ED on 9/13 when family found her unresponsive and hypoxic at home. NRB initiated by EMS and was more alert in the ED. Reported shortness of breath, productive cough, and weight gain. CXR with hazy bibasilar airspace opacities and cardiomegaly. ECHO 9/15 showed LVEF 50-55%, global hypokinesis, G3DD, RV normal, moderate to severe TR and worsening pulmonary HTN.   She reports she was only taking the torsemide prior to admission. Unclear if she was  taking this once or twice daily. When asked about this she answered differently each time. She reports not taking the spironolactone because she wasn't sure if she could be taking this with being sick. Reports eating high sodium containing foods.   Current HF Medications: ACE/ARB/ARNI: Entresto 49/51 mg BID MRA: spironolactone 25 mg daily  Prior to admission HF Medications: Diuretic: furosemide 80 mg daily AND torsemide 40 mg BID ACE/ARB/ARNI: Entresto 24/26 mg BID *reports not taking spironolactone (was confused if she should be taking it or not)  Pertinent Lab Values: Serum creatinine 1.22, BUN 50, Potassium 3.6, Sodium 136, BNP 320.4, Magnesium 1.7  Vital Signs: Weight: 210 lbs (admission weight: 220 lbs) Blood pressure: 170/70s  Heart rate: 50-70s  I/O: net -1.7L yesterday; net -8.6L since admission  Medication Assistance / Insurance Benefits Check: Does the patient have prescription insurance?  Yes Type of insurance plan: [place] Medicaid  Outpatient Pharmacy:  Prior to admission [facility] Is the patient willing to use [facility] TOC pharmacy at discharge? Yes Is the patient willing to transition their outpatient pharmacy to utilize a [facility] outpatient pharmacy?   No    Assessment: 1. Acute on chronic HFimpEF (LVEF 50-55%). NYHA class III symptoms. - S/p IV lasix and diamox yesterday. Consider restarting torsemide today. Script was written for furosemide and she was instructed to continue taking torsemide on her last discharge. Encourage resuming torsemide BID at discharge. Strict I/Os and daily weights. Keep K>4 and Mg>2. KCl 40 mEq x 1 and magnesium 2  g IV x ordered for replacement.  - Continue Entresto 49/51 mg BID - Continue spironolactone 25 mg daily   Plan: 1) Medication changes recommended at this time: - Restart torsemide - Stop furosemide at discharge; continue torsemide BID  2) Patient assistance: - None pending, has [place] Medicaid   3)  Education  -  Patient has been educated on current HF medications and potential additions to HF medication regimen - Patient verbalizes understanding that over the next few months, these medication doses may change and more medications may be added to optimize HF regimen - Patient has been educated on basic disease state pathophysiology and goals of therapy   Sharen Hones, PharmD, BCPS Heart Failure Stewardship Pharmacist Phone [phone]

## 2023-08-13 NOTE — Progress Notes (Signed)
SATURATION QUALIFICATIONS: (This note is used to comply with regulatory documentation for home oxygen)  Patient Saturations on Room Air at Rest = 94%  Patient Saturations on Room Air while Ambulating = 76%  Patient Saturations on 8 Liters of oxygen while Ambulating = 85%  Please briefly explain why patient needs home oxygen: Patient on chronic oxygen, began ambulating with 6L oxygen and O2 SAT dropped to 76%. Oxygen was increased to 8L, patient O2 SAT went up to 85%. Patient currently sitting up in chair, O2 SAT 89 at 7L. Elnita Maxwell, RN

## 2023-08-14 DIAGNOSIS — I5032 Chronic diastolic (congestive) heart failure: Secondary | ICD-10-CM | POA: Diagnosis not present

## 2023-08-14 DIAGNOSIS — J9621 Acute and chronic respiratory failure with hypoxia: Secondary | ICD-10-CM | POA: Diagnosis not present

## 2023-08-14 LAB — GLUCOSE, CAPILLARY
Glucose-Capillary: 40 mg/dL — CL (ref 70–99)
Glucose-Capillary: 89 mg/dL (ref 70–99)
Glucose-Capillary: 169 mg/dL — ABNORMAL HIGH (ref 70–99)
Glucose-Capillary: 204 mg/dL — ABNORMAL HIGH (ref 70–99)
Glucose-Capillary: 161 mg/dL — ABNORMAL HIGH (ref 70–99)
Glucose-Capillary: 123 mg/dL — ABNORMAL HIGH (ref 70–99)

## 2023-08-14 LAB — CBC
HCT: 38.3 % (ref 36.0–46.0)
Hemoglobin: 11.5 g/dL — ABNORMAL LOW (ref 12.0–15.0)
MCH: 27.2 pg (ref 26.0–34.0)
MCHC: 30 g/dL (ref 30.0–36.0)
MCV: 90.5 fL (ref 80.0–100.0)
Platelets: 187 10*3/uL (ref 150–400)
RBC: 4.23 MIL/uL (ref 3.87–5.11)
RDW: 21.4 % — ABNORMAL HIGH (ref 11.5–15.5)
WBC: 9.6 10*3/uL (ref 4.0–10.5)
nRBC: 1.6 % — ABNORMAL HIGH (ref 0.0–0.2)

## 2023-08-14 LAB — RENAL FUNCTION PANEL
Albumin: 2.8 g/dL — ABNORMAL LOW (ref 3.5–5.0)
Anion gap: 10 (ref 5–15)
BUN: 58 mg/dL — ABNORMAL HIGH (ref 8–23)
CO2: 27 mmol/L (ref 22–32)
Calcium: 8.3 mg/dL — ABNORMAL LOW (ref 8.9–10.3)
Chloride: 95 mmol/L — ABNORMAL LOW (ref 98–111)
Creatinine, Ser: 1.28 mg/dL — ABNORMAL HIGH (ref 0.44–1.00)
GFR, Estimated: 46 mL/min — ABNORMAL LOW (ref >60)
Glucose, Bld: 60 mg/dL — ABNORMAL LOW (ref 70–99)
Phosphorus: 4.8 mg/dL — ABNORMAL HIGH (ref 2.5–4.6)
Potassium: 4.5 mmol/L (ref 3.5–5.1)
Sodium: 132 mmol/L — ABNORMAL LOW (ref 135–145)

## 2023-08-14 LAB — MAGNESIUM: Magnesium: 1.9 mg/dL (ref 1.7–2.4)

## 2023-08-14 MED ORDER — INSULIN ASPART 100 UNIT/ML IJ SOLN
14.0000 [IU] | Freq: Three times a day (TID) | INTRAMUSCULAR | Status: DC
  Administered 2023-08-14 – 2023-08-15 (×3): 14 [IU] via SUBCUTANEOUS

## 2023-08-14 MED ORDER — TORSEMIDE 20 MG PO TABS
60.0000 mg | ORAL_TABLET | Freq: Every day | ORAL | Status: DC
  Administered 2023-08-14 – 2023-08-17 (×4): 60 mg via ORAL
  Filled 2023-08-14 (×4): qty 3

## 2023-08-14 MED ORDER — INSULIN GLARGINE-YFGN 100 UNIT/ML ~~LOC~~ SOLN
25.0000 [IU] | Freq: Every day | SUBCUTANEOUS | Status: DC
  Administered 2023-08-15 – 2023-08-16 (×2): 25 [IU] via SUBCUTANEOUS
  Filled 2023-08-14 (×3): qty 0.25

## 2023-08-14 NOTE — Progress Notes (Signed)
                 Interval history Overall feeling well today.  Breathing feels good.  Reports good activity tolerance.  Moving bowels regularly.  Making lots of urine.  Eager to go home but wants to take steps to ensure she does not return to the hospital.  Physical exam Blood pressure (!) 149/62, pulse 66, temperature (!) 97.1 F (36.2 C), temperature source Oral, resp. rate 14, height 5\' 7"  (1.702 m), weight 98.4 kg, SpO2 93%.  No distress Heart rate and rhythm normal, no appreciable murmurs or extra heart sounds, no lower extremity edema, no appreciable JVD while upright Breathing comfortably on 4 to 5 L via nasal cannula, bilateral lung sounds clear Skin is warm and dry Alert and oriented  Weight change: 3.1 kg   Intake/Output Summary (Last 24 hours) at 08/14/2023 1333 Last data filed at 08/14/2023 1300 Gross per 24 hour  Intake 221.69 ml  Output 1400 ml  Net -1178.31 ml   Net IO Since Admission: -9,805.42 mL [08/14/23 1333]  Labs, images, and other studies Steadily increasing BUN/creatinine  Assessment and plan Hospital day 7  Alicia [name] is a 67 y.o. with COPD and CHF admitted for acute on chronic hypoxic respiratory failure and treated concomitantly for COPD exacerbation and acute on chronic CHF, still with persistent hypoxemia.  Acute on chronic hypoxic respiratory failure Stable, but progress has plateaued.  She still desaturates pretty dramatically with very minimal exertion, for instance down to <80% while doing incentive spirometry exercises.  Over the last month she has been treated for community-acquired pneumonia, COPD exacerbation, and acute on chronic CHF.  At this point, she is euvolemic, COPD without exacerbation, and no signs of lingering infection.  Anticipate a prolonged recovery time for this person who is deconditioned after lots of time in the hospital recently for pulmonary insults. - Continue PT/OT while inpatient - Daily ambulatory SpO2  monitoring - Incentive spirometry every hour while awake  Chronic CHF Recovered LV ejection fraction of 50 to 55%.  Volume optimized.  Consider dry weight to be around 210 pounds.  Transition to oral diuretics, run net even daily. - Torsemide 60 mg daily - Entresto 49/51 - Spironolactone 25  COPD No wheezing today.  Continue LAMA/LABA/ICS via neb.  Type 2 diabetes with hyperglycemia Hypoglycemic to 40 by CBG this morning.  Will decrease insulin now that she is off prednisone. - Decrease glargine to 25 units daily - Decrease mealtime to 14 units 3 times daily  Chronic conditions Paroxysmal A-fib-continue Eliquis Aortic atherosclerosis-continue statin and ezetimibe  Diet: Regular with 1500 mL fluid restriction IVF: N/A VTE:  Therapeutic apixaban Code: Full PT/OT recommendations: Home health TOC recommendations: N/A  Discharge plan: Pending lung function recovery  Marrianne Mood MD 08/14/2023, 1:33 PM  Pager: [phone] After 5pm or weekend: [phone]

## 2023-08-14 NOTE — Progress Notes (Signed)
Blood glucose was 40 per nightshift nurse. Orange juice given and repeat blood sugar was 89. After eating breakfast, blood glucose 169- 14 units of novolog to be administered at this time.

## 2023-08-14 NOTE — Plan of Care (Signed)
Pt continues to receive furosemide IV and is diuresing well

## 2023-08-14 NOTE — Plan of Care (Signed)

## 2023-08-15 DIAGNOSIS — J9621 Acute and chronic respiratory failure with hypoxia: Secondary | ICD-10-CM | POA: Diagnosis not present

## 2023-08-15 DIAGNOSIS — I5032 Chronic diastolic (congestive) heart failure: Secondary | ICD-10-CM | POA: Diagnosis not present

## 2023-08-15 LAB — GLUCOSE, CAPILLARY
Glucose-Capillary: 64 mg/dL — ABNORMAL LOW (ref 70–99)
Glucose-Capillary: 84 mg/dL (ref 70–99)
Glucose-Capillary: 219 mg/dL — ABNORMAL HIGH (ref 70–99)
Glucose-Capillary: 255 mg/dL — ABNORMAL HIGH (ref 70–99)
Glucose-Capillary: 260 mg/dL — ABNORMAL HIGH (ref 70–99)
Glucose-Capillary: 202 mg/dL — ABNORMAL HIGH (ref 70–99)

## 2023-08-15 LAB — BASIC METABOLIC PANEL
Anion gap: 9 (ref 5–15)
BUN: 50 mg/dL — ABNORMAL HIGH (ref 8–23)
CO2: 31 mmol/L (ref 22–32)
Calcium: 8.4 mg/dL — ABNORMAL LOW (ref 8.9–10.3)
Chloride: 93 mmol/L — ABNORMAL LOW (ref 98–111)
Creatinine, Ser: 0.99 mg/dL (ref 0.44–1.00)
GFR, Estimated: >60 mL/min (ref >60)
Glucose, Bld: 70 mg/dL (ref 70–99)
Potassium: 3.6 mmol/L (ref 3.5–5.1)
Sodium: 133 mmol/L — ABNORMAL LOW (ref 135–145)

## 2023-08-15 NOTE — Progress Notes (Signed)
Mobility Specialist Progress Note:  Nurse requested Mobility Specialist to perform oxygen saturation test with pt which includes removing pt from oxygen both at rest and while ambulating.  Below are the results from that testing.     Patient Saturations on Room Air at Rest = spO2 76% O2 flow increased to 4L/min to get SPO2 to 90%  Patient Saturations on 4L/min while Ambulating = sp02 66% .    O2 flow flow increased to 6L/min, SPO2 increased to 92%  At end of testing pt left in room on 5  Liters of oxygen.  Reported results to nurse.    Thompson Grayer Mobility Specialist  Please contact vis Secure Chat or  Rehab Office [phone]

## 2023-08-15 NOTE — Progress Notes (Addendum)
0708: Blood sugar 64- 2 orange juices and graham cracker given. Awaiting breakfast tray to be delivered. 8119: Rechecked blood sugar 84.

## 2023-08-15 NOTE — Plan of Care (Signed)
Problem: Clinical Measurements: Goal: Ability to maintain clinical measurements within normal limits will improve Outcome: Progressing Goal: Respiratory complications will improve Outcome: Progressing

## 2023-08-15 NOTE — Progress Notes (Signed)
                 Interval history  Feeling well today, working on PT and spirometry.   Physical exam Blood pressure (!) 118/53, pulse 69, temperature 97.8 F (36.6 C), temperature source Oral, resp. rate 19, height 5\' 7"  (1.702 m), weight 97.3 kg, SpO2 100%.  Skin is warm and dry Alert and oriented No distress, resting comfortably  No use of accessory muscles, no retractions, lungs clear to auscultation. Using 4-5L via nasal cannula.  Heart rate and rhythm normal, no appreciable murmurs or extra heart sounds, no lower extremity edema Abdomen soft to palpation, non-distended, non-tender.   Weight change: -1.1 kg   Intake/Output Summary (Last 24 hours) at 08/15/2023 1334 Last data filed at 08/15/2023 1300 Gross per 24 hour  Intake 480 ml  Output 1200 ml  Net -720 ml   Net IO Since Admission: -10,525.42 mL [08/15/23 1334]  Assessment and plan Hospital day 8  Jakylah Panzera Galyon is a 67 y.o. with COPD and CHF admitted for acute on chronic hypoxic respiratory failure and treated concomitantly for COPD exacerbation and acute on chronic CHF with persistent hypoxemia.  Acute on chronic hypoxic respiratory failure Feels improved today. O2 needs have decreased some since [place]. Still not able to tolerate ambulation without desaturating at low levels. Desatted to 66% with 4L. Will need 4L via [place] to be discharged home safely. Likely due to hx of recent covid and PNA. Now has been treated for COPD exacerbation and has diuresed at goal.  - Continue PT/OT while inpatient - Daily ambulatory SpO2 monitoring - Incentive spirometry every hour while awake  Chronic CHF Recovered LV ejection fraction of 50 to 55%.  Volume optimized.  Dry weight to be around 210 pounds.  Up 4 lbs in the last two days. Will continue to monitor. Continue with oral diuretics, run net even daily. - cw Torsemide 60 mg daily - cw Entresto 49/51 - cw Spironolactone 25 -Strict I/Os  -daily standing weights  COPD No  wheezing today.  Continue LAMA/LABA/ICS via neb.  Type 2 diabetes with hyperglycemia Hypoglycemic to 64 this morning.  She recently completed a course of prednisone. Received 30units at noon yesterday. Will received decreased dose today.  - Decrease glargine to 25 units daily  - Decrease mealtime to 14 units 3 times daily -Hypoglycemic precautions.   Chronic conditions Paroxysmal A-fib-continue Eliquis Aortic atherosclerosis-continue statin and ezetimibe  Diet: Regular with 1500 mL fluid restriction IVF: N/A VTE:  Therapeutic apixaban Code: Full PT/OT recommendations: Home health TOC recommendations: N/A  Discharge plan: Pending lung function recovery  Manuela Neptune, MD  08/15/2023, 1:34 PM  Pager: [phone] After 5pm or weekend: [phone]

## 2023-08-15 NOTE — Plan of Care (Signed)

## 2023-08-15 NOTE — Progress Notes (Signed)
Mobility Specialist Progress Note:   08/15/23 1119  Mobility  Activity Ambulated with assistance in hallway  Level of Assistance Contact guard assist, steadying assist  Assistive Device Front wheel walker  Distance Ambulated (ft) 80 ft  Activity Response Tolerated well  Mobility Referral Yes  $Mobility charge 1 Mobility  Mobility Specialist Start Time (ACUTE ONLY) 1010  Mobility Specialist Stop Time (ACUTE ONLY) 1030  Mobility Specialist Time Calculation (min) (ACUTE ONLY) 20 min   Received pt in chair having no complaints and agreeable to mobility. Attempted to ambulate on 4L/min but d/t SPO2 dropping to 66%, O2 flow increased to 6L/min SPO2 WFL. Returned to room w/o fault. Needed to Use BSC, BM complete. Needed assistance w/ pericare. Left in chair w/ call bell in reach and all needs met.  Pre Mobility RA SPO2 76%                     4L/min SPO2 90% During Mobility 4L/min SPO2 66%                          6L/min SPO2 92% Post Mobility 5L/min SPO2 91%  Thompson Grayer Mobility Specialist  Please contact vis Secure Chat or  Rehab Office [phone]

## 2023-08-16 ENCOUNTER — Ambulatory Visit (HOSPITAL_COMMUNITY): Payer: 59

## 2023-08-16 DIAGNOSIS — J9621 Acute and chronic respiratory failure with hypoxia: Secondary | ICD-10-CM | POA: Diagnosis not present

## 2023-08-16 DIAGNOSIS — I5032 Chronic diastolic (congestive) heart failure: Secondary | ICD-10-CM | POA: Diagnosis not present

## 2023-08-16 LAB — GLUCOSE, CAPILLARY
Glucose-Capillary: 116 mg/dL — ABNORMAL HIGH (ref 70–99)
Glucose-Capillary: 214 mg/dL — ABNORMAL HIGH (ref 70–99)
Glucose-Capillary: 224 mg/dL — ABNORMAL HIGH (ref 70–99)
Glucose-Capillary: 330 mg/dL — ABNORMAL HIGH (ref 70–99)
Glucose-Capillary: 266 mg/dL — ABNORMAL HIGH (ref 70–99)

## 2023-08-16 LAB — BASIC METABOLIC PANEL
Anion gap: 8 (ref 5–15)
BUN: 42 mg/dL — ABNORMAL HIGH (ref 8–23)
CO2: 31 mmol/L (ref 22–32)
Calcium: 8.4 mg/dL — ABNORMAL LOW (ref 8.9–10.3)
Chloride: 95 mmol/L — ABNORMAL LOW (ref 98–111)
Creatinine, Ser: 1.22 mg/dL — ABNORMAL HIGH (ref 0.44–1.00)
GFR, Estimated: 49 mL/min — ABNORMAL LOW (ref >60)
Glucose, Bld: 122 mg/dL — ABNORMAL HIGH (ref 70–99)
Potassium: 4.1 mmol/L (ref 3.5–5.1)
Sodium: 134 mmol/L — ABNORMAL LOW (ref 135–145)

## 2023-08-16 LAB — MAGNESIUM: Magnesium: 1.5 mg/dL — ABNORMAL LOW (ref 1.7–2.4)

## 2023-08-16 MED ORDER — ALUM & MAG HYDROXIDE-SIMETH 200-200-20 MG/5ML PO SUSP
30.0000 mL | Freq: Four times a day (QID) | ORAL | Status: DC | PRN
  Administered 2023-08-16: 30 mL via ORAL
  Filled 2023-08-16: qty 30

## 2023-08-16 MED ORDER — INSULIN ASPART 100 UNIT/ML IJ SOLN
10.0000 [IU] | Freq: Three times a day (TID) | INTRAMUSCULAR | Status: DC
  Administered 2023-08-16 – 2023-08-17 (×4): 10 [IU] via SUBCUTANEOUS

## 2023-08-16 MED ORDER — ONDANSETRON HCL 4 MG/2ML IJ SOLN
4.0000 mg | Freq: Once | INTRAMUSCULAR | Status: AC
  Administered 2023-08-16: 4 mg via INTRAVENOUS
  Filled 2023-08-16: qty 2

## 2023-08-16 MED ORDER — MAGNESIUM SULFATE 4 GM/100ML IV SOLN
4.0000 g | Freq: Once | INTRAVENOUS | Status: AC
  Administered 2023-08-16: 4 g via INTRAVENOUS
  Filled 2023-08-16: qty 100

## 2023-08-16 NOTE — TOC Progression Note (Signed)
Transition of Care [facility]) - Progression Note    Patient Details  Name: Alicia [name] MRN: [id number] Date of Birth: [birth year]  [facility]) CM/SW Contact  Leone Haven, RN Phone Number: 08/16/2023, 3:16 PM  Clinical Narrative:    MD was asking if can increase liter flow for patient, informed MD she will need to get a concentrator for 10 liters, and will need a new order for new liter flow per [employer].   Expected Discharge Plan: Home w Home Health Services Barriers to Discharge: Continued Medical Work up  Expected Discharge Plan and Services In-house Referral: NA Discharge Planning Services: CM Consult Post Acute Care Choice: Resumption of Svcs/PTA Provider Living arrangements for the past 2 months: Single Family Home                 DME Arranged: N/A DME Agency: NA       HH Arranged: RN, PT, OT HH Agency: [facility] Date [facility] Agency Contacted: 08/10/23 Time HH Agency Contacted: 1233 Representative spoke with at [facility] Agency: Kandee Keen   Social Determinants of Health (SDOH) Interventions SDOH Screenings   Food Insecurity: No Food Insecurity (08/06/2023)  Housing: Patient Unable To Answer (08/07/2023)  Transportation Needs: No Transportation Needs (08/07/2023)  Utilities: Not At Risk (08/07/2023)  Alcohol Screen: Low Risk  (08/09/2023)  Depression (PHQ2-9): Low Risk  (07/07/2023)  Financial Resource Strain: Low Risk  (08/09/2023)  Physical Activity: Inactive (05/19/2023)  Social Connections: Socially Isolated (05/19/2023)  Stress: No Stress Concern Present (05/19/2023)  Tobacco Use: Medium Risk (08/06/2023)    Readmission Risk Interventions    08/10/2023   12:29 PM 07/28/2023   11:44 AM  Readmission Risk Prevention Plan  Transportation Screening Complete Complete  Medication Review [facility]) Complete Complete  PCP or Specialist appointment within 3-5 days of discharge Complete   HRI or Home Care Consult Complete Complete  SW Recovery  Care/Counseling Consult  Complete  Palliative Care Screening Not Applicable Not Applicable  Skilled Nursing Facility Not Applicable Not Applicable

## 2023-08-16 NOTE — Progress Notes (Signed)
Occupational Therapy Treatment Patient Details Name: Alicia [name] MRN: [id number] DOB: 29-Sep-1956 Today's Date: 08/16/2023   History of present illness Pt is 67 yo presenting to [facility] ED via EMS due to shortness of breath. Pt family found pt unresponsive at home with snoring respiration. O2 sats 72% at home. Pt has some increased swelling in LE. Pt was found to have COVID. PMH: COPD, CHF, DM, CAD and recent pneumonia.   OT comments  Pt progressing towards goals this session, on 6L O2 throughout with SpO2 down to 87% at lowest. Pt able to complete toilet transfer, UB dressing and standing grooming task with CGA-min A overall. Pt needing x1 seated rest break prior to hallway distance ambulation. Pt with good demonstration of activity pacing and pursed lip breathing during session. Pt presenting with impairments listed below, will follow acutely. Recommend HHOT at d/c.       If plan is discharge home, recommend the following:  A little help with walking and/or transfers;A little help with bathing/dressing/bathroom;Assistance with cooking/housework;Assist for transportation;Help with stairs or ramp for entrance   Equipment Recommendations  None recommended by OT    Recommendations for Other Services      Precautions / Restrictions Precautions Precautions: Fall Precaution Comments: monitor HR and sats Restrictions Weight Bearing Restrictions: No       Mobility Bed Mobility Overal bed mobility: Modified Independent                  Transfers Overall transfer level: Needs assistance Equipment used: Rolling walker (2 wheels) Transfers: Sit to/from Stand, Bed to chair/wheelchair/BSC Sit to Stand: Contact guard assist                 Balance Overall balance assessment: Needs assistance Sitting-balance support: No upper extremity supported, Feet supported Sitting balance-Leahy Scale: Good Sitting balance - Comments: sitting EOB   Standing balance support: During  functional activity, Reliant on assistive device for balance, Bilateral upper extremity supported Standing balance-Leahy Scale: Fair Standing balance comment: with RW support needed in dynamic standing                           ADL either performed or assessed with clinical judgement   ADL Overall ADL's : Needs assistance/impaired     Grooming: Wash/dry hands;Standing           Upper Body Dressing : Minimal assistance;Sitting Upper Body Dressing Details (indicate cue type and reason): donning clean gown     Toilet Transfer: Contact guard assist;Ambulation;Regular Toilet;Rolling walker (2 wheels)   Toileting- Clothing Manipulation and Hygiene: Contact guard assist       Functional mobility during ADLs: Contact guard assist      Extremity/Trunk Assessment Upper Extremity Assessment Upper Extremity Assessment: Generalized weakness   Lower Extremity Assessment Lower Extremity Assessment: Defer to PT evaluation        Vision   Vision Assessment?: No apparent visual deficits   Perception Perception Perception: Not tested   Praxis Praxis Praxis: Not tested    Cognition Arousal: Alert Behavior During Therapy: [facility] for tasks assessed/performed Overall Cognitive Status: Within Functional Limits for tasks assessed                                 General Comments: AAOx4 and pleasant throughout session. Pt demonstrates good safety awareness and problem solving during session.        Exercises  Shoulder Instructions       General Comments SPO2 87% at lowest on 6L O2 with ambulation    Pertinent Vitals/ Pain       Pain Assessment Pain Assessment: Faces Pain Score: 2  Pain Location: abdomen, nausea Pain Descriptors / Indicators: Discomfort Pain Intervention(s): Limited activity within patient's tolerance, Monitored during session, Repositioned  Home Living                                          Prior  Functioning/Environment              Frequency  Min 1X/week        Progress Toward Goals  OT Goals(current goals can now be found in the care plan section)  Progress towards OT goals: Progressing toward goals  Acute Rehab OT Goals Patient Stated Goal: none stated OT Goal Formulation: With patient Time For Goal Achievement: 08/25/23 Potential to Achieve Goals: Good ADL Goals Pt Will Perform Grooming: standing;with modified independence Pt Will Perform Lower Body Bathing: with modified independence;sit to/from stand Pt Will Transfer to Toilet: with modified independence;ambulating;regular height toilet;grab bars Pt Will Perform Toileting - Clothing Manipulation and hygiene: with modified independence;sit to/from stand Pt/caregiver will Perform Home Exercise Program: Increased strength;Both right and left upper extremity;With theraband;Independently;With written HEP provided Additional ADL Goal #1: Patient will demonstrate ability to Independently state 4 energy conservation strategies to increase safety and independence with functional tasks in the home.  Plan Discharge plan remains appropriate    Co-evaluation                 AM-PAC OT "6 Clicks" Daily Activity     Outcome Measure   Help from another person eating meals?: None Help from another person taking care of personal grooming?: A Little Help from another person toileting, which includes using toliet, bedpan, or urinal?: A Little Help from another person bathing (including washing, rinsing, drying)?: A Little Help from another person to put on and taking off regular upper body clothing?: A Little Help from another person to put on and taking off regular lower body clothing?: A Little 6 Click Score: 19    End of Session Equipment Utilized During Treatment: Gait belt;Rolling walker (2 wheels);Oxygen  OT Visit Diagnosis: Unsteadiness on feet (R26.81);Other abnormalities of gait and mobility (R26.89);Muscle  weakness (generalized) (M62.81);Other (comment)   Activity Tolerance Patient tolerated treatment well   Patient Left in chair;with call bell/phone within reach   Nurse Communication Mobility status;Other (comment)        Time: 2951-8841 OT Time Calculation (min): 30 min  Charges: OT General Charges $OT Visit: 1 Visit OT Treatments $Self Care/Home Management : 8-22 mins $Therapeutic Activity: 8-22 mins  Carver Fila, OTD, OTR/L SecureChat Preferred Acute Rehab [phone]   Carver Fila Koonce 08/16/2023, 11:08 AM

## 2023-08-16 NOTE — Plan of Care (Signed)
  Problem: Coping: Goal: Ability to adjust to condition or change in health will improve Outcome: Progressing   Problem: Fluid Volume: Goal: Ability to maintain a balanced intake and output will improve Outcome: Progressing   Problem: Nutritional: Goal: Maintenance of adequate nutrition will improve Outcome: Progressing   Problem: Education: Goal: Knowledge of General Education information will improve Description: Including pain rating scale, medication(s)/side effects and non-pharmacologic comfort measures Outcome: Progressing   Problem: Elimination: Goal: Will not experience complications related to urinary retention Outcome: Progressing   Problem: Pain Managment: Goal: General experience of comfort will improve Outcome: Progressing   Problem: Skin Integrity: Goal: Risk for impaired skin integrity will decrease Outcome: Progressing   Problem: Nutritional: Goal: Progress toward achieving an optimal weight will improve Outcome: Not Progressing   Problem: Health Behavior/Discharge Planning: Goal: Ability to manage health-related needs will improve Outcome: Not Progressing   Problem: Activity: Goal: Risk for activity intolerance will decrease Outcome: Not Progressing   Problem: Coping: Goal: Level of anxiety will decrease Outcome: Not Progressing

## 2023-08-16 NOTE — Progress Notes (Addendum)
HD#9 SUBJECTIVE:  Patient Summary: Alicia [name] is a 67 y.o. with a pertinent PMH of COPD, HFpEF (EF 50-60%), and T2DM who presented with shortness of breath and admitted for multifactorial acute on chronic respiratory failure.   Overnight Events: Meal time insulin decreased to 10 units as patient's glucose level had dropped ~60s for past few days   Interim History: Patient notes that her breathing has improved from yesterday.  Continues to have decreased saturations while ambulating but stable at rest.  Patient was on 5 L of oxygen on examination today.  While talking with her, her O2 saturations were around 97%.  Denies any shortness of breath, chest pain, nausea, vomiting, or any other signs or symptoms.  OBJECTIVE:  Vital Signs: Vitals:   08/15/23 2105 08/16/23 0125 08/16/23 0500 08/16/23 0730  BP: (!) 139/54 (!) 163/64  132/65  Pulse: 66 61  65  Resp: 17 17  20   Temp: 99.1 F (37.3 C) 98.8 F (37.1 C)  98.3 F (36.8 C)  TempSrc: Oral Oral  Oral  SpO2: 95% 100%  95%  Weight:   96.7 kg   Height:       Supplemental O2: Nasal Cannula SpO2: 95 % O2 Flow Rate (L/min): 5 L/min  Filed Weights   08/14/23 0603 08/15/23 0412 08/16/23 0500  Weight: 98.4 kg 97.3 kg 96.7 kg     Intake/Output Summary (Last 24 hours) at 08/16/2023 1127 Last data filed at 08/16/2023 0900 Gross per 24 hour  Intake 600 ml  Output 2100 ml  Net -1500 ml   Net IO Since Admission: -12,145.42 mL [08/16/23 1127]  CMP     Component Value Date/Time   NA 134 (L) 08/16/2023 0259   NA 137 07/15/2023 0754   K 4.1 08/16/2023 0259   CL 95 (L) 08/16/2023 0259   CO2 31 08/16/2023 0259   GLUCOSE 122 (H) 08/16/2023 0259   BUN 42 (H) 08/16/2023 0259   BUN 19 07/15/2023 0754   CREATININE 1.22 (H) 08/16/2023 0259   CREATININE 0.63 08/28/2015 0926   CALCIUM 8.4 (L) 08/16/2023 0259   PROT 5.7 (L) 07/19/2023 1814   PROT 6.6 03/22/2023 0745   ALBUMIN 2.8 (L) 08/14/2023 0319   ALBUMIN 3.9 03/22/2023 0745    AST 18 07/19/2023 1814   ALT 16 07/19/2023 1814   ALKPHOS 47 07/19/2023 1814   BILITOT 0.8 07/19/2023 1814   BILITOT 0.3 03/22/2023 0745   GFR 110.83 03/27/2015 1146   EGFR 79 07/15/2023 0754   GFRNONAA 49 (L) 08/16/2023 0259   GFRNONAA >89 02/27/2014 1149   CBC    Component Value Date/Time   WBC 9.6 08/14/2023 0319   RBC 4.23 08/14/2023 0319   HGB 11.5 (L) 08/14/2023 0319   HGB 10.1 (L) 07/07/2023 1444   HCT 38.3 08/14/2023 0319   HCT 31.4 (L) 07/07/2023 1444   PLT 187 08/14/2023 0319   PLT 261 07/07/2023 1444   MCV 90.5 08/14/2023 0319   MCV 82 07/07/2023 1444   MCH 27.2 08/14/2023 0319   MCHC 30.0 08/14/2023 0319   RDW 21.4 (H) 08/14/2023 0319   RDW 15.1 07/07/2023 1444   LYMPHSABS 0.6 (L) 08/06/2023 0817   MONOABS 1.0 08/06/2023 0817   EOSABS 0.0 08/06/2023 0817   BASOSABS 0.0 08/06/2023 0817    Physical Exam: Physical Exam HENT:     Head: Normocephalic and atraumatic.     Comments: Currently on 5 L of oxygen Cardiovascular:     Rate and Rhythm: Normal rate  and regular rhythm.  Pulmonary:     Effort: Pulmonary effort is normal.     Breath sounds: Normal breath sounds.  Abdominal:     General: Abdomen is flat. Bowel sounds are normal.  Skin:    General: Skin is warm.  Neurological:     General: No focal deficit present.  Psychiatric:        Attention and Perception: Attention normal.        Behavior: Behavior is cooperative.    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 Patient continues to desaturate with any exertion but will saturate well at rest on 5 L of [place].  Still targeting goal of 4 L nasal cannula to be discharged safely at home. - Continue ambulation trials for O2 requirements and resultant desaturations  - Continue PT/OT at home - Encouraged incentive spirometry every hour throughout the day.  She may need evaluation for higher dose of oxygen at home, would discuss with home health agency.   #T2DM with Hyperglycemia Patient had episode of hypoglycemia yesterday morning at 64.  Her mealtime bolus was decreased to 10 units as she had an additional episode of hypoglycemia previously.  Denies any signs or symptoms at the moment. - Begin insulin aspart 10 units three times daily with meals  - Continue glargine 25 units  - Trend CBG   #Chronic CHF, resolved Recovered left ventricular ejection fraction of 50 to 55%.  Patient continues to be euvolemic on examination.  I/O last 24 hours -1.3 L.  She continues to tolerate diuretics well and we will continue to monitor. - Continue torsemide 60 mg daily. - will also continue entresto and spironolactone, daily bmet for electrolytes  #COPD Denies any wheezing, shortness of breath, chest pain, or other signs or symptoms.  Continues to be well-controlled with triple therapy.  - Continue triple therapy with LAMA/LABA/ICS   #Hypomagnesia  Decreased at 1.5 this morning and replenished. Will continue to monitor.  -Replenished with 4 G IV magnesium  Chronic Conditions #Paroxysmal atrial fibrillation - Continue Eliquis  #Aortic atherosclerosis - Continue statin and ezetimibe   Best Practice: Diet: Regular diet VTE: Therapeutic apixaban  Code: Full AB: None Therapy Recs: Home Health DISPO: Anticipated discharge tomorrow to  home  pending  oxygen requirements .  Signature: Morrie Sheldon,  MD Internal Medicine Resident, PGY-1 [facility]  Pager: [phone]  Please contact the on call pager after 5 pm and on weekends at [phone].

## 2023-08-16 NOTE — Progress Notes (Signed)
Heart Failure Stewardship Pharmacist Progress Note   PCP: [name], MD PCP-Cardiologist: Donato Schultz, MD    HPI:  67 yo F with PMH of COPD, oxygen dependent, CHF (EF 20-25% in 2015, recovered to 55-60%), CAD, HLD, GERD, T2DM, and CVA.    Admitted in Nov 2023 with acute on chronic CHF exacerbation. Diuresed w/ IV Lasix and treated w/ abx, steroids and nebs. Echo showed normal EF 55-60% and normal RV. After diureses, was transitioned to PO Lasix. Also placed on Entresto.   She was seen in HF TOC 10/2022. Jardiance increased to 10 mg daily and spironlactone added. She was seen back in [facility] 11/2022 and spironolactone was increased to 25 mg daily. She was later seen at [facility] on 1/30 and lasix was decreased to 80 mg AM and 40 mg PM.   Admitted 01/2023 with acute on chronic CHF exacerbation. ECHO 3/17 showed LVEF 60-65%. Diuresed with IV lasix 120 mg, metolazone, and diamox. Discharged on Entresto, spironolactone, amlodipine, and lasix. [name] held due to UTI.    Admitted 05/2023 with a/c CHF. CXR showed pulmonary congestion. BNP elevated. ECHO 7/24 showed LVEF 50-60%, G3DD, no RWMA, RV normal, moderately elevated PA pressure. Spironolactone with held at discharge.  She was seen again in HF TOC 06/2023. Volume status trending up. She was instructed to increase torsemide to 40 mg AM and 20 mg PM. Spironolactone was restarted.   Admitted 06/2023 with COPD exacerbation due to COVID-19 infection. She was discharged on 9/4.   Presented back to the ED on 9/13 when family found her unresponsive and hypoxic at home. NRB initiated by EMS and was more alert in the ED. Reported shortness of breath, productive cough, and weight gain. CXR with hazy bibasilar airspace opacities and cardiomegaly. ECHO 9/15 showed LVEF 50-55%, global hypokinesis, G3DD, RV normal, moderate to severe TR and worsening pulmonary HTN.   She reports she was only taking the torsemide prior to admission. Unclear if she was  taking this once or twice daily. When asked about this she answered differently each time. She reports not taking the spironolactone because she wasn't sure if she could be taking this with being sick. Reports eating high sodium containing foods.   Current HF Medications: Diuretic: torsemide 60 mg daily ACE/ARB/ARNI: Entresto 49/51 mg BID MRA: spironolactone 25 mg daily  Prior to admission HF Medications: Diuretic: furosemide 80 mg daily AND torsemide 40 mg BID ACE/ARB/ARNI: Entresto 24/26 mg BID *reports not taking spironolactone (was confused if she should be taking it or not)  Pertinent Lab Values: Serum creatinine 1.22, BUN 42, Potassium 4.1, Sodium 134, BNP 320.4, Magnesium 1.5  Vital Signs: Weight: 213 lbs (admission weight: 220 lbs) Blood pressure: 130-160/60s  Heart rate: 60-70s  I/O: net -1.6L yesterday; net -12.1L since admission  Medication Assistance / Insurance Benefits Check: Does the patient have prescription insurance?  Yes Type of insurance plan: [place] Medicaid  Outpatient Pharmacy:  Prior to admission outpatient [facility] Is the patient willing to use [facility] TOC pharmacy at discharge? Yes Is the patient willing to transition their outpatient pharmacy to utilize a [facility] outpatient pharmacy?   No    Assessment: 1. Acute on chronic HFimpEF (LVEF 50-55%). NYHA class II symptoms. - Continue torsemide 60 mg daily. Script was written for furosemide and she was instructed to continue taking torsemide on her last discharge. Strict I/Os and daily weights. Keep K>4 and Mg>2. Magnesium 4 g IV x ordered for replacement.  - Continue Entresto 49/51 mg  BID - Continue spironolactone 25 mg daily   Plan: 1) Medication changes recommended at this time: - Stop furosemide at discharge; continue torsemide  2) Patient assistance: - None pending, has [place] Medicaid   3)  Education  - Patient has been educated on current HF medications and potential additions to HF medication  regimen - Patient verbalizes understanding that over the next few months, these medication doses may change and more medications may be added to optimize HF regimen - Patient has been educated on basic disease state pathophysiology and goals of therapy   Sharen Hones, PharmD, BCPS Heart Failure Stewardship Pharmacist Phone [phone]

## 2023-08-16 NOTE — Progress Notes (Signed)
Physical Therapy Treatment Patient Details Name: Alicia [name] MRN: [id number] DOB: 07-08-1956 Today's Date: 08/16/2023   History of Present Illness Pt is 67 yo presenting to [facility] ED via EMS due to shortness of breath. Pt family found pt unresponsive at home with snoring respiration. O2 sats 72% at home. Pt has some increased swelling in LE. Pt was found to have COVID. PMH: COPD, CHF, DM, CAD and recent pneumonia.    PT Comments  Pt seen for PT tx with pt pleasant & agreeable. Pt is able to complete STS with supervision, and ambulate into hallway with RW & supervision with 1 standing rest break. Pt on 5L/min at rest at beginning & end of session, 6L/min during gait with SpO2 dropping as low as 73-74% after walking but pt able to recover to >/= 90% on 5L/min. Pt with good demo of pursed lip breathing. Will continue to follow pt acutely to address endurance/activity tolerance, strengthening, & gait with LRAD.    If plan is discharge home, recommend the following: A little help with bathing/dressing/bathroom;Assistance with cooking/housework;Help with stairs or ramp for entrance;Assist for transportation   Can travel by private vehicle     Yes  Equipment Recommendations  None recommended by PT    Recommendations for Other Services       Precautions / Restrictions Precautions Precautions: Fall Precaution Comments: monitor HR and sats Restrictions Weight Bearing Restrictions: No     Mobility  Bed Mobility               General bed mobility comments: not tested, pt received & left sitting in recliner    Transfers Overall transfer level: Needs assistance Equipment used: Rolling walker (2 wheels) Transfers: Sit to/from Stand Sit to Stand: Supervision                Ambulation/Gait Ambulation/Gait assistance: Supervision Gait Distance (Feet): 100 Feet Assistive device: Rolling walker (2 wheels) Gait Pattern/deviations: Decreased step length - right, Decreased step  length - left, Decreased stride length Gait velocity: decreased     General Gait Details: 1 standign rest break   Stairs             Wheelchair Mobility     Tilt Bed    Modified Rankin (Stroke Patients Only)       Balance Overall balance assessment: Needs assistance Sitting-balance support: No upper extremity supported, Feet supported Sitting balance-Leahy Scale: Good     Standing balance support: During functional activity, Reliant on assistive device for balance, Bilateral upper extremity supported Standing balance-Leahy Scale: Fair                              Cognition Arousal: Alert Behavior During Therapy: WFL for tasks assessed/performed Overall Cognitive Status: Within Functional Limits for tasks assessed                                 General Comments: pleasant, appropriate throughout session, motivated to participate        Exercises      General Comments General comments (skin integrity, edema, etc.): SPO2 87% at lowest on 6L O2 with ambulation      Pertinent Vitals/Pain Pain Assessment Pain Assessment: No/denies pain    Home Living  Prior Function            PT Goals (current goals can now be found in the care plan section) Acute Rehab PT Goals Patient Stated Goal: to go home PT Goal Formulation: With patient Time For Goal Achievement: 08/25/23 Potential to Achieve Goals: Good Progress towards PT goals: Progressing toward goals    Frequency    Min 1X/week      PT Plan Current plan remains appropriate    Co-evaluation              AM-PAC PT "6 Clicks" Mobility   Outcome Measure  Help needed turning from your back to your side while in a flat bed without using bedrails?: None Help needed moving from lying on your back to sitting on the side of a flat bed without using bedrails?: None Help needed moving to and from a bed to a chair (including a  wheelchair)?: A Little Help needed standing up from a chair using your arms (e.g., wheelchair or bedside chair)?: A Little Help needed to walk in hospital room?: A Little Help needed climbing 3-5 steps with a railing? : A Little 6 Click Score: 20    End of Session Equipment Utilized During Treatment: Oxygen Activity Tolerance: Treatment limited secondary to medical complications (Comment);Patient limited by fatigue (increased O2 needs) Patient left: in chair;with call bell/phone within reach Nurse Communication:  (O2, pt given cup of water per pt request) PT Visit Diagnosis: Muscle weakness (generalized) (M62.81);Difficulty in walking, not elsewhere classified (R26.2)     Time: 6962-9528 PT Time Calculation (min) (ACUTE ONLY): 20 min  Charges:    $Therapeutic Activity: 8-22 mins PT General Charges $$ ACUTE PT VISIT: 1 Visit                     Alicia [name], PT, DPT 08/16/23, 1:43 PM   Sandi Mariscal 08/16/2023, 1:42 PM

## 2023-08-16 NOTE — Progress Notes (Signed)
Mobility Specialist Progress Note:   08/16/23 1404  Mobility  Activity Ambulated with assistance in hallway  Level of Assistance Contact guard assist, steadying assist  Assistive Device Front wheel walker  Distance Ambulated (ft) 90 ft  Activity Response Tolerated well  Mobility Referral Yes  $Mobility charge 1 Mobility  Mobility Specialist Start Time (ACUTE ONLY) 1110  Mobility Specialist Stop Time (ACUTE ONLY) 1125  Mobility Specialist Time Calculation (min) (ACUTE ONLY) 15 min   Pt received in chair, agreeable to ambulate. No physical assistance needed throughout session. During ambulation SPO2 dropped to 74% on 6L/min. O2 flow had to be increased to 8L/min to get SPO2 to 90%. Returned to room w/o fault. Pt requested to use the [facility]. Call bell in reach. NT notified.   During Mobility 6L/min SPO2 74%-85%                          8L/min SPO2 90% Post Mobility 5L/mn SPO2 90%   Thompson Grayer Mobility Specialist  Please contact vis Secure Chat or  Rehab Office [phone]

## 2023-08-16 NOTE — Plan of Care (Signed)

## 2023-08-17 ENCOUNTER — Other Ambulatory Visit (HOSPITAL_COMMUNITY): Payer: Self-pay

## 2023-08-17 DIAGNOSIS — I5032 Chronic diastolic (congestive) heart failure: Secondary | ICD-10-CM | POA: Diagnosis not present

## 2023-08-17 DIAGNOSIS — J9621 Acute and chronic respiratory failure with hypoxia: Secondary | ICD-10-CM | POA: Diagnosis not present

## 2023-08-17 LAB — BASIC METABOLIC PANEL
Anion gap: 7 (ref 5–15)
BUN: 36 mg/dL — ABNORMAL HIGH (ref 8–23)
CO2: 30 mmol/L (ref 22–32)
Calcium: 8.5 mg/dL — ABNORMAL LOW (ref 8.9–10.3)
Chloride: 97 mmol/L — ABNORMAL LOW (ref 98–111)
Creatinine, Ser: 0.97 mg/dL (ref 0.44–1.00)
GFR, Estimated: >60 mL/min (ref >60)
Glucose, Bld: 125 mg/dL — ABNORMAL HIGH (ref 70–99)
Potassium: 4.4 mmol/L (ref 3.5–5.1)
Sodium: 134 mmol/L — ABNORMAL LOW (ref 135–145)

## 2023-08-17 LAB — GLUCOSE, CAPILLARY
Glucose-Capillary: 132 mg/dL — ABNORMAL HIGH (ref 70–99)
Glucose-Capillary: 105 mg/dL — ABNORMAL HIGH (ref 70–99)

## 2023-08-17 LAB — MAGNESIUM: Magnesium: 2.1 mg/dL (ref 1.7–2.4)

## 2023-08-17 MED ORDER — INSULIN GLARGINE 100 UNIT/ML SOLOSTAR PEN
20.0000 [IU] | PEN_INJECTOR | Freq: Every day | SUBCUTANEOUS | 3 refills | Status: DC
  Filled 2023-08-17: qty 15, 75d supply, fill #0

## 2023-08-17 MED ORDER — SACUBITRIL-VALSARTAN 49-51 MG PO TABS
1.0000 | ORAL_TABLET | Freq: Two times a day (BID) | ORAL | 2 refills | Status: DC
  Filled 2023-08-17: qty 60, 30d supply, fill #0

## 2023-08-17 MED ORDER — TORSEMIDE 20 MG PO TABS
60.0000 mg | ORAL_TABLET | Freq: Every day | ORAL | 2 refills | Status: DC
  Filled 2023-08-17: qty 90, 30d supply, fill #0

## 2023-08-17 MED ORDER — INSULIN LISPRO (1 UNIT DIAL) 100 UNIT/ML (KWIKPEN)
14.0000 [IU] | PEN_INJECTOR | Freq: Three times a day (TID) | SUBCUTANEOUS | 3 refills | Status: DC
  Filled 2023-08-17: qty 15, 36d supply, fill #0

## 2023-08-17 MED ORDER — INSULIN ASPART 100 UNIT/ML IJ SOLN
14.0000 [IU] | Freq: Three times a day (TID) | INTRAMUSCULAR | Status: DC
  Administered 2023-08-17: 14 [IU] via SUBCUTANEOUS

## 2023-08-17 MED ORDER — INSULIN GLARGINE-YFGN 100 UNIT/ML ~~LOC~~ SOLN
20.0000 [IU] | Freq: Every day | SUBCUTANEOUS | Status: DC
  Administered 2023-08-17: 20 [IU] via SUBCUTANEOUS
  Filled 2023-08-17: qty 0.2

## 2023-08-17 MED ORDER — SPIRONOLACTONE 25 MG PO TABS
25.0000 mg | ORAL_TABLET | Freq: Every day | ORAL | 2 refills | Status: DC
  Filled 2023-08-17: qty 30, 30d supply, fill #0

## 2023-08-17 NOTE — Discharge Summary (Addendum)
Name: Alicia [name] MRN: [id number] DOB: November 17, 1956 67 y.o. PCP: [name], MD  Date of Admission: 08/06/2023  8:05 AM Date of Discharge:  08/17/23 Attending Physician: Dr. 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 (HCC)   COPD (chronic obstructive pulmonary disease) (HCC)   Aortic atherosclerosis (HCC)   Acute on chronic combined systolic and diastolic CHF (congestive heart failure) (HCC)   Sinoatrial node dysfunction (HCC)   Paroxysmal atrial fibrillation (HCC)   Demand ischemia of myocardium    DISCHARGE MEDICATIONS:   Allergies as of 08/17/2023       Reactions   Canagliflozin Itching, Anxiety, Palpitations        Medication List     STOP taking these medications    amLODipine 10 MG tablet Commonly known as: NORVASC   Entresto 24-26 MG Generic drug: sacubitril-valsartan Replaced by: Sherryll Burger 49-51 MG   hydrochlorothiazide 25 MG tablet Commonly known as: HYDRODIURIL       TAKE these medications    Accu-Chek Aviva Plus test strip Generic drug: glucose blood USE TO TEST BLOOD SUGARS AS DIRECTED What changed: See the new instructions.   BLOOD GLUCOSE TEST STRIPS Strp Use as directed to check blood sugar 3 (three) times daily. What changed: Another medication with the same name was changed. Make sure you understand how and when to take each.   accu-chek multiclix lancets Use to check your blood sugar four times daily: early morning, before a meal, two hours after a meal, and bedtime   TRUEplus Lancets 28G Misc Use as directed to check blood sugar 3 (three) times daily.   albuterol (2.5 MG/3ML) 0.083% nebulizer solution Commonly known as: PROVENTIL INHALE 3 ML BY NEBULIZATION EVERY 6  HOURS AS NEEDED FOR WHEEZING OR SHORTNESS OF BREATH   apixaban 5 MG Tabs tablet Commonly known as: ELIQUIS Take 1 tablet (5 mg total) by mouth 2 (two) times daily.   atorvastatin 80 MG tablet Commonly known as: LIPITOR TAKE 1 TABLET BY MOUTH EVERY DAY   carbamide peroxide 6.5 % OTIC solution Commonly known as: DEBROX Place 5 drops into both ears 2 (two) times daily.   Entresto 49-51 MG Generic drug: sacubitril-valsartan Take 1 tablet by mouth 2 (two) times daily. Replaces: Entresto 24-26 MG   ezetimibe 10 MG tablet Commonly known as: ZETIA TAKE 1 TABLET BY MOUTH EVERY DAY   guaiFENesin-dextromethorphan 100-10 MG/5ML syrup Commonly known as: ROBITUSSIN DM Take 10 mLs by mouth at bedtime.   HumaLOG KwikPen 100 UNIT/ML KwikPen Generic drug: insulin lispro Inject 14 Units into the skin 3 (three) times daily before meals. Only take if eating a meal AND Blood Glucose (BG) is 80 or higher. What changed: See the new instructions.   Insulin Pen Needle 32G X 4 MM Misc Use to inject insulin 4 times a day. The patient is insulin requiring, ICD 10 code 11.10. The patient injects 4 times per day.   TechLite Pen Needles 31G X 8 MM Misc Generic drug: Insulin Pen Needle Use 3 (three) times daily.   B-D UF III MINI PEN NEEDLES 31G X 5 MM Misc Generic drug: Insulin Pen Needle USE 3 TIMES A DAY WITH HUMALOG   Insulin Syringe-Needle U-100 31G X 15/64" 0.3 ML Misc Use to inject Humalog before meals three times  a day   Lantus SoloStar 100 UNIT/ML Solostar Pen Generic drug: insulin glargine Inject 20 Units into the skin daily.   spironolactone 25 MG tablet Commonly known as: ALDACTONE Take 1 tablet (25 mg total) by mouth daily. What changed: how much to take   torsemide 20 MG tablet Commonly known as: DEMADEX Take 3 tablets (60 mg total) by mouth daily. Start taking on: August 18, 2023 What changed: See the new instructions.   Trelegy Ellipta 200-62.5-25 MCG/ACT Aepb Generic  drug: Fluticasone-Umeclidin-Vilant Inhale 1 puff into the lungs daily.   TRUEdraw Lancing Device Misc Use 3 (three) times daily.   Trulicity 1.5 MG/0.5ML Sopn Generic drug: Dulaglutide Inject 1.5 mg into the skin once a week.   Vitamin D (Ergocalciferol) 1.25 MG (50000 UNIT) Caps capsule Commonly known as: DRISDOL Take 1 capsule (50,000 Units total) by mouth every 7 (seven) days.               Durable Medical Equipment  (From admission, onward)           Start     Ordered   08/16/23 1528  For home use only DME oxygen  Once       Question Answer Comment  Length of Need Lifetime   Mode or (Route) Nasal cannula   Liters per Minute 7   Frequency Continuous (stationary and portable oxygen unit needed)   Oxygen delivery system Gas      08/16/23 1528            DISPOSITION AND FOLLOW-UP:  Alicia [name] was discharged from [facility] in [place] condition. At the hospital follow up visit please address:  Follow-up Recommendations: Consults: None  Labs: Basic Metabolic Profile Studies: O2 saturation with ambulation  Medications: Entresto, Humalog, insulin glargine, spironolactone, torsemide, Eliquis, atorvastatin, Zetia, Trulicity, Trelegy Ellipta, albuterol,   Follow-up Appointments:  Follow-up Information     [facility]. Go in 11 day(s).   Specialty: Cardiology Why: Hospital follow up 08/24/2023 @ 1:30 pm PLEASE bring a current medication list to appointment FREE valet parking, Entrance C, off [employer] information: [address] [place] [address] [phone]        [name], MD. Go on 08/23/2023.   Specialty: Internal Medicine Why: @10 :45am Contact information: [address] [place] [address] [phone]                 HOSPITAL COURSE:  Patient Summary: #Acute on Chronic Hypoxic Respiratory Failure #COPD  Exacerbation, Resolved  #Acute on Chronic HFpEF with CHF Exacerbation, Resolved Patient initially presented on 08/06/2023 after her daughter had trouble waking her from sleep in the morning.  She was having worsening dyspnea on exertion and cough production with scant sputum.  She was also hypervolemic on exam and had gained roughly 13 pounds over the past month despite decreased intake of fluids.  She was stable on 5 L of supplemental oxygen but desaturated when speaking or eating.  She was started on IV furosemide 120 mg 3 times daily.  She was restarted on her LABA/LAMA/ICS and bronchodilators.  Her tropes had increased to 101 but she denied chest pain without changes on the EKG.  She developed a dry cough with wheezing and rhonchi bilaterally.  She was started on prednisone 40 mg for 5 days and azithromycin 500 mg for 3 days.  She was placed on BiPAP but could not tolerate the mask  and was placed on 13 L of oxygen while saturating 94%.  Her wheezing improved in her bilateral lungs and she was slowly returning to her baseline volume status. She was transitioned back to p.o. torsemide 60 mg daily at time of discharge.  She was given acetazolamide 500 mg daily due to elevated bicarb. Despite continued volume output, she continued to desaturate while ambulating with PT and would require 8 L of oxygen.   The main obstacle to discharge remained persistent desaturations when ambulating.  At discharge, patient was able to maintain O2 saturation of 90% while ambulating on 8 L of oxygen.  She was discharged with oxygen condenser of 10 L, and she was given education on her increased oxygen need when ambulating (7-8 L) but stable O2 sats with 5L O2 at rest.  Her entresto was increased to 49/51 BID and she was started on spironolactone (not taking at home due to confusion).  She will not be on amlodipine or hydrochlorothiazide.  She should no longer take furosemide at home.  She will follow up with CHF clinic and internal  medicine.   #T2DM with Hyperglycemia  She was started on 40 units of glargine but was noted to have resistant SSI.  She was also started on her NovoLog 14 units at breakfast and lunch and 20 units with dinner.  She was noted to be hypoglycemic so her basal dose of glargine was decreased to 30 units daily.  Patient was again noted to be hypoglycemic so her glargine was decreased to 20 units/day and her mealtime was decreased to 14 units 3 times daily.  At discharge, patient denied any polyuria, polydipsia, or any other signs or symptoms associated with hyperglycemia.  She will also continue her trulicity once weekly.   #Sinoatrial node dysfunction  Noted to have historically avoided beta-blockers due to a history of bradycardia.   #Paroxysmal atrial fibrillation  She was found to be in sinus rhythm and continue to take her apixaban 5 mg twice daily.  #Aortic atherosclerosis Patient was continued on statin and ezetimibe.    DISCHARGE INSTRUCTIONS:   Discharge Instructions     (HEART FAILURE PATIENTS) Call MD:  Anytime you have any of the following symptoms: 1) 3 pound weight gain in 24 hours or 5 pounds in 1 week 2) shortness of breath, with or without a dry hacking cough 3) swelling in the hands, feet or stomach 4) if you have to sleep on extra pillows at night in order to breathe.   Complete by: As directed    Call MD for:  difficulty breathing, headache or visual disturbances   Complete by: As directed    Call MD for:  extreme fatigue   Complete by: As directed    Call MD for:  hives   Complete by: As directed    Call MD for:  persistant dizziness or light-headedness   Complete by: As directed    Call MD for:  persistant nausea and vomiting   Complete by: As directed    Call MD for:  redness, tenderness, or signs of infection (pain, swelling, redness, odor or green/yellow discharge around incision site)   Complete by: As directed    Call MD for:  severe uncontrolled pain   Complete  by: As directed    Call MD for:  temperature >100.4   Complete by: As directed    Diet - low sodium heart healthy   Complete by: As directed    Discharge instructions   Complete  by: As directed    Dear Alicia [name],  You were treated for your shortness of breath. We have made the following adjustments to your medication regimen.   *STOP taking the following medications:  - Amlodipine 10 mg  - Entresto 24-25 mg  - Hydrochlorothiazide 25 mg  - Torsemide 2 tablets (40 mg) every morning and 1 tablet (20 mg) every evening  *START taking the following medications:  - Entresto 49-51 mg twice daily  - Humalog 14 units three times daily before meals  - Glargine 20 units daily  - Spironolactone 25 mg daily  - Torsemide 60 mg daily  *CONTINUE taking the following medications:  - Eliquis 5 mg two times daily   - Atorvastatin 80 mg daily - Zetia 10 mg daily  - Trulicity 1.5 mg weekly - Trelegy Ellipta 1 puff into lungs daily  - Albuterol 3 mL by nebulization every 6 hours as needed for wheezing or shortness of breath  - Drisdol 1.25 mg by mouth every seven days   For your oxygen requirements, we have placed an order for an oxygen condenser. At rest, your oxygen saturations are stable at 5 L. Please note that when you get up to walk, your oxygen requirements are greater, and we recommend 7-8 L. Your oxygen levels have been stable for the past few days prior to your discharge date.   If you have any questions, please reach out to [name] at [phone]. We are glad that you are feeling better!   Face-to-face encounter (required for Medicare/Medicaid patients)   Complete by: As directed    I Morrie Sheldon certify that this patient is under my care and that I, or a nurse practitioner or physician's assistant working with me, had a face-to-face encounter that meets the physician face-to-face encounter requirements with this patient on 08/17/2023. The encounter with the patient was in whole, or in part  for the following medical condition(s) which is the primary reason for home health care (List medical condition): Deconditioning, chronic hypoxic respiratory failure   The encounter with the patient was in whole, or in part, for the following medical condition, which is the primary reason for home health care: Deconditioning, chronic hypoxic respiratory failure   I certify that, based on my findings, the following services are medically necessary home health services:  Nursing Physical therapy     Reason for Medically Necessary Home Health Services:  Skilled Nursing- Change/Decline in Patient Status Therapy- Therapeutic Exercises to Increase Strength and Endurance Therapy- Instruction on Safe use of Assistive Devices for ADLs Therapy- Investment banker, operational, Patent examiner Therapy- Instruction on use of Assistive Device for Ambulation on all Surfaces Therapy- Home Adaptation to Facilitate Safety     My clinical findings support the need for the above services:  Unable to leave home safely without assistance and/or assistive device Shortness of breath with activity     Further, I certify that my clinical findings support that this patient is homebound due to:  Shortness of Breath with activity Unable to leave home safely without assistance     Home Health   Complete by: As directed    To provide the following care/treatments:  PT OT RN     Increase activity slowly   Complete by: As directed        SUBJECTIVE:  Patient denied any shortness of breath, chest pain, troubles breathing, abdominal pain, or any other signs or symptoms.  She remained stable at 5 L of nasal cannula  oxygen at rest.   Discharge Vitals:   BP (!) 123/55 (BP Location: Right Arm)   Pulse 66   Temp 98.8 F (37.1 C) (Oral)   Resp 20   Ht 5\' 7"  (1.702 m)   Wt 98 kg   LMP  (LMP Unknown)   SpO2 96%   BMI 33.84 kg/m   OBJECTIVE:  Physical Exam Constitutional:      Appearance: Normal appearance.   HENT:     Head: Normocephalic and atraumatic.  Cardiovascular:     Rate and Rhythm: Normal rate and regular rhythm.  Pulmonary:     Effort: Pulmonary effort is normal.     Breath sounds: Normal breath sounds.  Abdominal:     General: Abdomen is flat. Bowel sounds are normal.     Palpations: Abdomen is soft.  Musculoskeletal:     Right lower leg: No edema.     Left lower leg: No edema.  Neurological:     Mental Status: She is alert.  Psychiatric:        Attention and Perception: Attention normal.     Pertinent Labs, Studies, and Procedures:     Latest Ref Rng & Units 08/14/2023    3:19 AM 08/08/2023    4:17 AM 08/07/2023    3:20 AM  CBC  WBC 4.0 - 10.5 K/uL 9.6  18.4  13.7   Hemoglobin 12.0 - 15.0 g/dL 62.9  8.7  8.9   Hematocrit 36.0 - 46.0 % 38.3  28.2  29.0   Platelets 150 - 400 K/uL 187  354  382        Latest Ref Rng & Units 08/17/2023    2:29 AM 08/16/2023    2:59 AM 08/15/2023    2:51 AM  CMP  Glucose 70 - 99 mg/dL 528  413  70   BUN 8 - 23 mg/dL 36  42  50   Creatinine 0.44 - 1.00 mg/dL 2.44  0.10  2.72   Sodium 135 - 145 mmol/L 134  134  133   Potassium 3.5 - 5.1 mmol/L 4.4  4.1  3.6   Chloride 98 - 111 mmol/L 97  95  93   CO2 22 - 32 mmol/L 30  31  31    Calcium 8.9 - 10.3 mg/dL 8.5  8.4  8.4     CT Angio Chest Aorta W and/or Wo Contrast  Result Date: 08/06/2023 CLINICAL DATA:  Pulmonary hypertension. Hypoxic. History of COPD and CHF. EXAM: CT ANGIOGRAPHY CHEST WITH CONTRAST TECHNIQUE: Multidetector CT imaging of the chest was performed using the standard protocol during bolus administration of intravenous contrast. Multiplanar CT image reconstructions and MIPs were obtained to evaluate the vascular anatomy. RADIATION DOSE REDUCTION: This exam was performed according to the departmental dose-optimization program which includes automated exposure control, adjustment of the mA and/or kV according to patient size and/or use of iterative reconstruction technique.  CONTRAST:  75mL OMNIPAQUE IOHEXOL 350 MG/ML SOLN COMPARISON:  CT noncontrast 07/26/2023. CT angiogram chest 03/16/2023. Chest x-ray 08/06/2023 and older FINDINGS: Cardiovascular: Heart is enlarged. No significant pericardial effusion. Coronary artery calcifications are seen. Question left ventricular wall hypertrophy. Thoracic aorta has a normal course and caliber with scattered partially calcified atherosclerotic plaque. There is some pulsation artifact along the ascending aorta. There is breathing motion throughout the examination. This can limit evaluation of, nondiagnostic for, small and peripheral emboli. No segmental or larger pulmonary embolism. Mediastinum/Nodes: Small hiatal hernia. Normal caliber thoracic esophagus which is mildly patulous with air.  Slightly heterogeneous thyroid gland. No specific abnormal lymph node enlargement identified in the axillary regions. There are some small nodes seen in the hilum and mediastinum. A few are enlarged. Example right hilar node measures 2.1 x 1.9 cm on series 5, image 68. These new from previous. Other nodes are also enlarged. Lungs/Pleura: New consolidation left lower lobe with a opacification of bronchi and small adjacent pleural effusion. This has more volume loss. There is also significant areas in the right lower lobe and inferior aspect of the middle lobe which is progressed since the study of earlier September 2024. No pneumothorax. Trace right-sided pleural fluid as well. Upper Abdomen: Adrenal glands are preserved in the upper abdomen. Fatty liver infiltration. Slightly nodular contours of the liver as well. Musculoskeletal: Diffuse degenerative changes of the spine. There is a focal lipoma along the subscapularis muscle on the left. Review of the MIP images confirms the above findings. IMPRESSION: No segmental or larger pulmonary embolism. Breathing motion limits evaluation. Enlarged heart with enlargement of the pulmonary arteries. Developing  opacification of the left lower lobe with a opacification of bronchi and small left effusion. There is also some increasing opacities in the right lung base, middle lobe and lower lobe with a tiny right effusion. There is also some developing abnormal nodes in the mediastinum and hilum. This could be an infiltrative process or infection. Recommend follow-up to confirm clearance. Significant bronchial opacity as well in the right lower lobe. Aortic Atherosclerosis (ICD10-I70.0). Electronically Signed   By: Karen Kays M.D.   On: 08/06/2023 12:23   CT Head Wo Contrast  Result Date: 08/06/2023 CLINICAL DATA:  Altered mental status. EXAM: CT HEAD WITHOUT CONTRAST TECHNIQUE: Contiguous axial images were obtained from the base of the skull through the vertex without intravenous contrast. RADIATION DOSE REDUCTION: This exam was performed according to the departmental dose-optimization program which includes automated exposure control, adjustment of the mA and/or kV according to patient size and/or use of iterative reconstruction technique. COMPARISON:  None Available. FINDINGS: Brain: No acute hemorrhage. Cortical gray-white differentiation is preserved. Patchy hypoattenuation of the cerebral white matter, most consistent with mild chronic small-vessel disease. Prominence of the ventricles and sulci within normal limits for age. No extra-axial collection. Basilar cisterns are patent. Vascular: No hyperdense vessel or unexpected calcification. Skull: No calvarial fracture or suspicious bone lesion. Skull base is unremarkable. Sinuses/Orbits: No acute finding. Other: None. IMPRESSION: 1. No acute intracranial abnormality. 2. Mild chronic small-vessel disease. Electronically Signed   By: Orvan Falconer M.D.   On: 08/06/2023 12:18   DG Chest Port 1 View  Result Date: 08/06/2023 CLINICAL DATA:  sob EXAM: PORTABLE CHEST 1 VIEW COMPARISON:  Chest radiograph 07/25/2023 FINDINGS: Cardiomegaly. No pleural effusion. No  pneumothorax. Hazy bibasilar airspace opacities are favored to represent atelectasis. No radiographically apparent displaced rib fractures. Visualized upper abdomen is unremarkable. IMPRESSION: 1. Slightly improved hazy bibasilar airspace opacities compared to prior chest radiograph. 2.  Cardiomegaly Electronically Signed   By: Lorenza Cambridge M.D.   On: 08/06/2023 08:46     Signed: Morrie Sheldon, MD Internal Medicine Resident, PGY-1 [facility]  Pager: [phone] 2:38 PM, 08/17/2023

## 2023-08-17 NOTE — TOC Transition Note (Addendum)
Transition of Care [facility]) - CM/SW Discharge Note   Patient Details  Name: Alicia [name] MRN: [id number] Date of Birth: 08/28/1956  Transition [facility] CM/SW Contact:  Leone Haven, RN Phone Number: 08/17/2023, 12:01 PM   Clinical Narrative:    Patient is for dc today, her grandson or grand daughter will be here at 3 pm to transport her home they will bring her oxygen tank to go home with.  Terry with [facility] states she is taking care of the concentrator switch out.  NCM notified [facility] of dc today.  Benefit check for entresto in process.   Final next level of care: Home w Home Health Services Barriers to Discharge: Continued Medical Work up   Patient Goals and CMS Choice CMS Medicare.gov Compare Post Acute Care list provided to:: Patient Choice offered to / list presented to : Patient  Discharge Placement                         Discharge Plan and Services Additional resources added to the After Visit Summary for   In-house Referral: NA Discharge Planning Services: CM Consult Post Acute Care Choice: Resumption of Svcs/PTA Provider          DME Arranged: N/A DME Agency: NA       HH Arranged: RN, PT, OT HH Agency: [facility] Date [facility] Agency Contacted: 08/10/23 Time HH Agency Contacted: 1233 Representative spoke with at [facility] Agency: Kandee Keen  Social Determinants of Health (SDOH) Interventions SDOH Screenings   Food Insecurity: No Food Insecurity (08/06/2023)  Housing: Patient Unable To Answer (08/07/2023)  Transportation Needs: No Transportation Needs (08/07/2023)  Utilities: Not At Risk (08/07/2023)  Alcohol Screen: Low Risk  (08/09/2023)  Depression (PHQ2-9): Low Risk  (07/07/2023)  Financial Resource Strain: Low Risk  (08/09/2023)  Physical Activity: Inactive (05/19/2023)  Social Connections: Socially Isolated (05/19/2023)  Stress: No Stress Concern Present (05/19/2023)  Tobacco Use: Medium Risk (08/06/2023)     Readmission Risk  Interventions    08/10/2023   12:29 PM 07/28/2023   11:44 AM  Readmission Risk Prevention Plan  Transportation Screening Complete Complete  Medication Review Oceanographer) Complete Complete  PCP or Specialist appointment within 3-5 days of discharge Complete   HRI or Home Care Consult Complete Complete  SW Recovery Care/Counseling Consult  Complete  Palliative Care Screening Not Applicable Not Applicable  Skilled Nursing Facility Not Applicable Not Applicable

## 2023-08-17 NOTE — Progress Notes (Signed)
Heart Failure Stewardship Pharmacist Progress Note   PCP: [name], MD PCP-Cardiologist: Donato Schultz, MD    HPI:  67 yo F with PMH of COPD, oxygen dependent, CHF (EF 20-25% in 2015, recovered to 55-60%), CAD, HLD, GERD, T2DM, and CVA.    Admitted in Nov 2023 with acute on chronic CHF exacerbation. Diuresed w/ IV Lasix and treated w/ abx, steroids and nebs. Echo showed normal EF 55-60% and normal RV. After diureses, was transitioned to PO Lasix. Also placed on Entresto.   She was seen in HF TOC 10/2022. Jardiance increased to 10 mg daily and spironlactone added. She was seen back in [facility] 11/2022 and spironolactone was increased to 25 mg daily. She was later seen at [facility] on 1/30 and lasix was decreased to 80 mg AM and 40 mg PM.   Admitted 01/2023 with acute on chronic CHF exacerbation. ECHO 3/17 showed LVEF 60-65%. Diuresed with IV lasix 120 mg, metolazone, and diamox. Discharged on Entresto, spironolactone, amlodipine, and lasix. [name] held due to UTI.    Admitted 05/2023 with a/c CHF. CXR showed pulmonary congestion. BNP elevated. ECHO 7/24 showed LVEF 50-60%, G3DD, no RWMA, RV normal, moderately elevated PA pressure. Spironolactone with held at discharge.  She was seen again in HF TOC 06/2023. Volume status trending up. She was instructed to increase torsemide to 40 mg AM and 20 mg PM. Spironolactone was restarted.   Admitted 06/2023 with COPD exacerbation due to COVID-19 infection. She was discharged on 9/4.   Presented back to the ED on 9/13 when family found her unresponsive and hypoxic at home. NRB initiated by EMS and was more alert in the ED. Reported shortness of breath, productive cough, and weight gain. CXR with hazy bibasilar airspace opacities and cardiomegaly. ECHO 9/15 showed LVEF 50-55%, global hypokinesis, G3DD, RV normal, moderate to severe TR and worsening pulmonary HTN.   She reports she was only taking the torsemide prior to admission. Unclear if she was  taking this once or twice daily. When asked about this she answered differently each time. She reports not taking the spironolactone because she wasn't sure if she could be taking this with being sick. Reports eating high sodium containing foods.   Discharge HF Medications: Diuretic: torsemide 60 mg daily ACE/ARB/ARNI: Entresto 49/51 mg BID MRA: spironolactone 25 mg daily  Prior to admission HF Medications: Diuretic: furosemide 80 mg daily AND torsemide 40 mg BID ACE/ARB/ARNI: Entresto 24/26 mg BID *reports not taking spironolactone (was confused if she should be taking it or not)  Pertinent Lab Values: Serum creatinine 0.97, BUN 36, Potassium 4.4, Sodium 134, BNP 320.4, Magnesium 2.1  Vital Signs: Weight: 216 lbs (admission weight: 220 lbs) Blood pressure: 130/60s  Heart rate: 60-70s  I/O: net -1.4L yesterday; net -12.6L since admission  Medication Assistance / Insurance Benefits Check: Does the patient have prescription insurance?  Yes Type of insurance plan: [place] Medicaid  Outpatient Pharmacy:  Prior to admission [facility] Is the patient willing to use [facility] TOC pharmacy at discharge? Yes Is the patient willing to transition their outpatient pharmacy to utilize a [facility] outpatient pharmacy?   No    Assessment: 1. Acute on chronic HFimpEF (LVEF 50-55%). NYHA class II symptoms. - Continue torsemide 60 mg daily, may need to increase to BID with weight gain. Script was written for furosemide and she was instructed to continue taking torsemide on her last discharge. Strict I/Os and daily weights. Keep K>4 and Mg>2.  - Continue Entresto [phone]  mg BID - Continue spironolactone 25 mg daily   Plan: 1) Medication changes recommended at this time: - Consider increasing torsemide to BID on discharge  2) Patient assistance: - None pending, has [place] Medicaid   3)  Education  - Patient has been educated on current HF medications and potential additions to HF medication  regimen - Patient verbalizes understanding that over the next few months, these medication doses may change and more medications may be added to optimize HF regimen - Patient has been educated on basic disease state pathophysiology and goals of therapy   Sharen Hones, PharmD, BCPS Heart Failure Stewardship Pharmacist Phone [phone]

## 2023-08-17 NOTE — Progress Notes (Signed)
Mobility Specialist Progress Note:    08/17/23 1030  Mobility  Activity Transferred from bed to chair  Level of Assistance Minimal assist, patient does 75% or more  Assistive Device Other (Comment) (HHA)  Distance Ambulated (ft) 5 ft  Activity Response Tolerated well  Mobility Referral Yes  $Mobility charge 1 Mobility  Mobility Specialist Start Time (ACUTE ONLY) 0940  Mobility Specialist Stop Time (ACUTE ONLY) 0955  Mobility Specialist Time Calculation (min) (ACUTE ONLY) 15 min   Pt received in bed agreeable to mobility. Declined ambulating in halls but agreeable to transfer to the chair. Was able to take a couple of steps towards the chair w/o fault w/ HHA. Assisted in bed change and gown  change. Situated in chair w/ call bell and personal belongings in reach. All needs met.  Thompson Grayer Mobility Specialist  Please contact vis Secure Chat or  Rehab Office [phone]

## 2023-08-17 NOTE — Care Management Important Message (Signed)
Important Message  Patient Details  Name: Alicia [name] MRN: [id number] Date of Birth: Sep 29, 1956   Important Message Given:  Yes - Medicare IM     Dorena Bodo 08/17/2023, 3:19 PM

## 2023-08-18 ENCOUNTER — Other Ambulatory Visit: Payer: Self-pay

## 2023-08-18 DIAGNOSIS — I4891 Unspecified atrial fibrillation: Secondary | ICD-10-CM

## 2023-08-18 NOTE — Progress Notes (Signed)
Alicia [name] Mar 31, 1956 [id number]  Patient attempted to be outreached by Thomasene Ripple, PharmD Candidate to discuss hypertension. Left voicemail for patient to return our call at their convenience at [phone].  Thomasene Ripple, Student-PharmD

## 2023-08-19 ENCOUNTER — Other Ambulatory Visit: Payer: Self-pay

## 2023-08-19 MED ORDER — APIXABAN 5 MG PO TABS
5.0000 mg | ORAL_TABLET | Freq: Two times a day (BID) | ORAL | 11 refills | Status: DC
Start: 2023-08-19 — End: 2023-11-01

## 2023-08-19 MED ORDER — ACCU-CHEK AVIVA PLUS VI STRP: 1.0000 | ORAL_STRIP | 3 refills | Status: DC

## 2023-08-20 DIAGNOSIS — I3139 Other pericardial effusion (noninflammatory): Secondary | ICD-10-CM | POA: Diagnosis not present

## 2023-08-20 DIAGNOSIS — Z794 Long term (current) use of insulin: Secondary | ICD-10-CM | POA: Diagnosis not present

## 2023-08-20 DIAGNOSIS — J9621 Acute and chronic respiratory failure with hypoxia: Secondary | ICD-10-CM | POA: Diagnosis not present

## 2023-08-20 DIAGNOSIS — I480 Paroxysmal atrial fibrillation: Secondary | ICD-10-CM | POA: Diagnosis not present

## 2023-08-20 DIAGNOSIS — Z7952 Long term (current) use of systemic steroids: Secondary | ICD-10-CM | POA: Diagnosis not present

## 2023-08-20 DIAGNOSIS — J9811 Atelectasis: Secondary | ICD-10-CM | POA: Diagnosis not present

## 2023-08-20 DIAGNOSIS — Z7985 Long-term (current) use of injectable non-insulin antidiabetic drugs: Secondary | ICD-10-CM | POA: Diagnosis not present

## 2023-08-20 DIAGNOSIS — E874 Mixed disorder of acid-base balance: Secondary | ICD-10-CM | POA: Diagnosis not present

## 2023-08-20 DIAGNOSIS — Z7901 Long term (current) use of anticoagulants: Secondary | ICD-10-CM | POA: Diagnosis not present

## 2023-08-20 DIAGNOSIS — I2510 Atherosclerotic heart disease of native coronary artery without angina pectoris: Secondary | ICD-10-CM | POA: Diagnosis not present

## 2023-08-20 DIAGNOSIS — S3092XD Unspecified superficial injury of abdominal wall, subsequent encounter: Secondary | ICD-10-CM | POA: Diagnosis not present

## 2023-08-20 DIAGNOSIS — E119 Type 2 diabetes mellitus without complications: Secondary | ICD-10-CM | POA: Diagnosis not present

## 2023-08-20 DIAGNOSIS — J449 Chronic obstructive pulmonary disease, unspecified: Secondary | ICD-10-CM | POA: Diagnosis not present

## 2023-08-20 DIAGNOSIS — E785 Hyperlipidemia, unspecified: Secondary | ICD-10-CM | POA: Diagnosis not present

## 2023-08-20 DIAGNOSIS — I110 Hypertensive heart disease with heart failure: Secondary | ICD-10-CM | POA: Diagnosis not present

## 2023-08-20 DIAGNOSIS — J9611 Chronic respiratory failure with hypoxia: Secondary | ICD-10-CM | POA: Diagnosis not present

## 2023-08-20 DIAGNOSIS — D649 Anemia, unspecified: Secondary | ICD-10-CM | POA: Diagnosis not present

## 2023-08-20 DIAGNOSIS — Z9981 Dependence on supplemental oxygen: Secondary | ICD-10-CM | POA: Diagnosis not present

## 2023-08-20 DIAGNOSIS — B9689 Other specified bacterial agents as the cause of diseases classified elsewhere: Secondary | ICD-10-CM | POA: Diagnosis not present

## 2023-08-20 DIAGNOSIS — J440 Chronic obstructive pulmonary disease with acute lower respiratory infection: Secondary | ICD-10-CM | POA: Diagnosis not present

## 2023-08-20 DIAGNOSIS — I5043 Acute on chronic combined systolic (congestive) and diastolic (congestive) heart failure: Secondary | ICD-10-CM | POA: Diagnosis not present

## 2023-08-20 DIAGNOSIS — Z9181 History of falling: Secondary | ICD-10-CM | POA: Diagnosis not present

## 2023-08-20 DIAGNOSIS — I081 Rheumatic disorders of both mitral and tricuspid valves: Secondary | ICD-10-CM | POA: Diagnosis not present

## 2023-08-20 DIAGNOSIS — J441 Chronic obstructive pulmonary disease with (acute) exacerbation: Secondary | ICD-10-CM | POA: Diagnosis not present

## 2023-08-20 DIAGNOSIS — Z7951 Long term (current) use of inhaled steroids: Secondary | ICD-10-CM | POA: Diagnosis not present

## 2023-08-23 ENCOUNTER — Encounter: Payer: Self-pay | Admitting: Student in an Organized Health Care Education/Training Program

## 2023-08-23 ENCOUNTER — Ambulatory Visit: Payer: 59 | Admitting: Student in an Organized Health Care Education/Training Program

## 2023-08-23 DIAGNOSIS — Z7952 Long term (current) use of systemic steroids: Secondary | ICD-10-CM | POA: Diagnosis not present

## 2023-08-23 DIAGNOSIS — Z794 Long term (current) use of insulin: Secondary | ICD-10-CM | POA: Diagnosis not present

## 2023-08-23 DIAGNOSIS — I2510 Atherosclerotic heart disease of native coronary artery without angina pectoris: Secondary | ICD-10-CM | POA: Diagnosis not present

## 2023-08-23 DIAGNOSIS — D649 Anemia, unspecified: Secondary | ICD-10-CM | POA: Diagnosis not present

## 2023-08-23 DIAGNOSIS — I5043 Acute on chronic combined systolic (congestive) and diastolic (congestive) heart failure: Secondary | ICD-10-CM | POA: Diagnosis not present

## 2023-08-23 DIAGNOSIS — I081 Rheumatic disorders of both mitral and tricuspid valves: Secondary | ICD-10-CM | POA: Diagnosis not present

## 2023-08-23 DIAGNOSIS — I3139 Other pericardial effusion (noninflammatory): Secondary | ICD-10-CM | POA: Diagnosis not present

## 2023-08-23 DIAGNOSIS — E874 Mixed disorder of acid-base balance: Secondary | ICD-10-CM | POA: Diagnosis not present

## 2023-08-23 DIAGNOSIS — J9621 Acute and chronic respiratory failure with hypoxia: Secondary | ICD-10-CM | POA: Diagnosis not present

## 2023-08-23 DIAGNOSIS — Z9981 Dependence on supplemental oxygen: Secondary | ICD-10-CM | POA: Diagnosis not present

## 2023-08-23 DIAGNOSIS — Z7985 Long-term (current) use of injectable non-insulin antidiabetic drugs: Secondary | ICD-10-CM | POA: Diagnosis not present

## 2023-08-23 DIAGNOSIS — J440 Chronic obstructive pulmonary disease with acute lower respiratory infection: Secondary | ICD-10-CM | POA: Diagnosis not present

## 2023-08-23 DIAGNOSIS — J439 Emphysema, unspecified: Secondary | ICD-10-CM | POA: Diagnosis not present

## 2023-08-23 DIAGNOSIS — I480 Paroxysmal atrial fibrillation: Secondary | ICD-10-CM

## 2023-08-23 DIAGNOSIS — B9689 Other specified bacterial agents as the cause of diseases classified elsewhere: Secondary | ICD-10-CM | POA: Diagnosis not present

## 2023-08-23 DIAGNOSIS — I110 Hypertensive heart disease with heart failure: Secondary | ICD-10-CM | POA: Diagnosis not present

## 2023-08-23 DIAGNOSIS — S3092XD Unspecified superficial injury of abdominal wall, subsequent encounter: Secondary | ICD-10-CM | POA: Diagnosis not present

## 2023-08-23 DIAGNOSIS — I5032 Chronic diastolic (congestive) heart failure: Secondary | ICD-10-CM | POA: Diagnosis not present

## 2023-08-23 DIAGNOSIS — J9811 Atelectasis: Secondary | ICD-10-CM | POA: Diagnosis not present

## 2023-08-23 DIAGNOSIS — Z7901 Long term (current) use of anticoagulants: Secondary | ICD-10-CM | POA: Diagnosis not present

## 2023-08-23 DIAGNOSIS — E785 Hyperlipidemia, unspecified: Secondary | ICD-10-CM | POA: Diagnosis not present

## 2023-08-23 DIAGNOSIS — E119 Type 2 diabetes mellitus without complications: Secondary | ICD-10-CM | POA: Diagnosis not present

## 2023-08-23 DIAGNOSIS — Z7951 Long term (current) use of inhaled steroids: Secondary | ICD-10-CM | POA: Diagnosis not present

## 2023-08-23 DIAGNOSIS — Z9181 History of falling: Secondary | ICD-10-CM | POA: Diagnosis not present

## 2023-08-23 DIAGNOSIS — J441 Chronic obstructive pulmonary disease with (acute) exacerbation: Secondary | ICD-10-CM | POA: Diagnosis not present

## 2023-08-23 NOTE — Assessment & Plan Note (Signed)
Patient now with chronic heart failure with preserved ejection fraction, has had 3 hospitalizations with IV diuresis recently.  Currently on torsemide 20 mg 3 times a day with good response.  Discharge weight was 215 pounds, weight this morning at home was 207 pounds.  She will continue to weigh herself and I told her to call me if her weight gets above 210 pounds.  Will plan to continue with Entresto and spironolactone.

## 2023-08-23 NOTE — Assessment & Plan Note (Signed)
Difficult case of a person living with COPD with 3 hospitalizations over the last 2 months for acute hypoxia which are multifactorial from COPD exacerbations, COVID-19 infections, and chronic HFpEF exacerbations.  She no longer uses tobacco.  Up through June she was doing well at home on 2 L supplemental oxygen with exertion.  Since the hospitalization she has required much higher levels of supplemental oxygen.  Currently needing 8 L, which is what she was discharged at 1 week ago.  She reports doing okay since being discharged, but has not been able to leave the house because of her high oxygen requirements.  She is short of breath with minimal exertion around the house.  When she does leave the house she uses 6 L on a portable tank, which only lasts about 40 minutes at that rate.  Reports good adherence with Trelegy.  Chronic HFpEF seems pretty well compensated.  Likely has obesity hypoventilation also contributing.  Will continue with Trelegy, diuresis, albuterol as needed, wean supplemental oxygen as able.  Patient will need more conditioning and rehabilitation when supplemental oxygen needs improved.  Also has a consultation with pulmonology later this week.

## 2023-08-23 NOTE — Assessment & Plan Note (Signed)
Atrial fibrillation diagnosed during hospitalization in August.  Tolerating anticoagulation with apixaban well for primary stroke prophylaxis.

## 2023-08-23 NOTE — Progress Notes (Signed)
[facility] Internal Medicine Residency Telephone Encounter Continuity Care Appointment  HPI:  This telephone encounter was created for [name] on 08/23/2023 for the following purpose/cc Hospital follow-up.  67 year old person living with COPD, chronic heart failure with preserved ejection fraction, CAD, calling in today for telephone visit for hospital follow-up.  This was supposed to be in person but she was unable to leave the house because of dyspnea this morning.  She was discharged requiring high amounts of supplemental oxygen, around 6-8 L which she gets from a concentrator at home.  She has been unable to leave the house for very long because the portable oxygen tanks only last about 30 or 40 minutes at that flow rate.  She is worried about getting to the clinic this morning.  She gets short of breath walking around her house, and it takes her several minutes to recover even with supplemental oxygen.  She reports consistent use of inhalers and other medications without adverse side effects.  Reports making lots of urine using torsemide 2-3 times daily.     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  Non-functioning   Aortic atherosclerosis (HCC) 09/24/2017   Asymptomatic, found on CT scan   Blood transfusion without reported diagnosis    Chronic Systolic Heart Failure 05/10/2014   Felt to be non-ischemic and secondary to hypertension.  Echo (05/28/2014): LVEF 25%.  Is not interested in AICD placement.   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  No residual deficits.   History of cerebrovascular accident 05/10/2013   Per patient report 6 previous strokes, most recent on 05/10/13.  No residual deficits.   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 (HCC) 10/15/2013     ROS:  No fevers, chills, chest pain, or pressure   Assessment / Plan / Recommendations:  Please see A&P under problem oriented charting for assessment of the patient's acute and chronic medical conditions.  As always, pt is advised that if symptoms worsen or new symptoms arise, they should go to an urgent care facility or to to ER for further evaluation.   Consent and Medical Decision Making:   This is a telephone encounter between Kimm Sider [place] and [name] on 08/23/2023 for hospital follow-up. The visit was conducted with the patient located at home and [name] at [facility]. The patient's identity was confirmed using their DOB and current address. The patient has consented to  being evaluated through a telephone encounter and understands the associated risks (an examination cannot be done and the patient may need to come in for an appointment) / benefits (allows the patient to remain at home, decreasing exposure to coronavirus). I personally spent 14 minutes on medical discussion.    Pulmonary emphysema, unspecified  emphysema type (HCC) Assessment & Plan: Difficult case of a person living with COPD with 3 hospitalizations over the last 2 months for acute hypoxia which are multifactorial from COPD exacerbations, COVID-19 infections, and chronic HFpEF exacerbations.  She no longer uses tobacco.  Up through June she was doing well at home on 2 L supplemental oxygen with exertion.  Since the hospitalization she has required much higher levels of supplemental oxygen.  Currently needing 8 L, which is what she was discharged at 1 week ago.  She reports doing okay since being discharged, but has not been able to leave the house because of her high oxygen requirements.  She is short of breath with minimal exertion around the house.  When she does leave the house she uses 6 L on a portable tank, which only lasts about 40 minutes at that rate.  Reports good adherence with Trelegy.  Chronic HFpEF seems pretty well compensated.  Likely has obesity hypoventilation also contributing.  Will continue with Trelegy, diuresis, albuterol as needed, wean supplemental oxygen as able.  Patient will need more conditioning and rehabilitation when supplemental oxygen needs improved.  Also has a consultation with pulmonology later this week.   Heart failure with recovered ejection fraction (HFrecEF) [facility]) Assessment & Plan: Patient now with chronic heart failure with preserved ejection fraction, has had 3 hospitalizations with IV diuresis recently.  Currently on torsemide 20 mg 3 times a day with good response.  Discharge weight was 215 pounds, weight this morning at home was 207 pounds.  She will continue to weigh herself and I told her to call me if her weight gets above 210 pounds.  Will plan to continue with Entresto and spironolactone.   Paroxysmal atrial fibrillation [facility]) Assessment & Plan: Atrial fibrillation diagnosed during hospitalization in August.  Tolerating anticoagulation with apixaban well for primary stroke prophylaxis.

## 2023-08-24 ENCOUNTER — Ambulatory Visit (HOSPITAL_COMMUNITY): Payer: 59

## 2023-08-25 DIAGNOSIS — B9689 Other specified bacterial agents as the cause of diseases classified elsewhere: Secondary | ICD-10-CM | POA: Diagnosis not present

## 2023-08-25 DIAGNOSIS — Z7952 Long term (current) use of systemic steroids: Secondary | ICD-10-CM | POA: Diagnosis not present

## 2023-08-25 DIAGNOSIS — Z794 Long term (current) use of insulin: Secondary | ICD-10-CM | POA: Diagnosis not present

## 2023-08-25 DIAGNOSIS — I110 Hypertensive heart disease with heart failure: Secondary | ICD-10-CM | POA: Diagnosis not present

## 2023-08-25 DIAGNOSIS — I5043 Acute on chronic combined systolic (congestive) and diastolic (congestive) heart failure: Secondary | ICD-10-CM | POA: Diagnosis not present

## 2023-08-25 DIAGNOSIS — E119 Type 2 diabetes mellitus without complications: Secondary | ICD-10-CM | POA: Diagnosis not present

## 2023-08-25 DIAGNOSIS — Z9181 History of falling: Secondary | ICD-10-CM | POA: Diagnosis not present

## 2023-08-25 DIAGNOSIS — I480 Paroxysmal atrial fibrillation: Secondary | ICD-10-CM | POA: Diagnosis not present

## 2023-08-25 DIAGNOSIS — J9621 Acute and chronic respiratory failure with hypoxia: Secondary | ICD-10-CM | POA: Diagnosis not present

## 2023-08-25 DIAGNOSIS — I081 Rheumatic disorders of both mitral and tricuspid valves: Secondary | ICD-10-CM | POA: Diagnosis not present

## 2023-08-25 DIAGNOSIS — Z9981 Dependence on supplemental oxygen: Secondary | ICD-10-CM | POA: Diagnosis not present

## 2023-08-25 DIAGNOSIS — E874 Mixed disorder of acid-base balance: Secondary | ICD-10-CM | POA: Diagnosis not present

## 2023-08-25 DIAGNOSIS — J441 Chronic obstructive pulmonary disease with (acute) exacerbation: Secondary | ICD-10-CM | POA: Diagnosis not present

## 2023-08-25 DIAGNOSIS — I3139 Other pericardial effusion (noninflammatory): Secondary | ICD-10-CM | POA: Diagnosis not present

## 2023-08-25 DIAGNOSIS — I2510 Atherosclerotic heart disease of native coronary artery without angina pectoris: Secondary | ICD-10-CM | POA: Diagnosis not present

## 2023-08-25 DIAGNOSIS — D649 Anemia, unspecified: Secondary | ICD-10-CM | POA: Diagnosis not present

## 2023-08-25 DIAGNOSIS — S3092XD Unspecified superficial injury of abdominal wall, subsequent encounter: Secondary | ICD-10-CM | POA: Diagnosis not present

## 2023-08-25 DIAGNOSIS — E785 Hyperlipidemia, unspecified: Secondary | ICD-10-CM | POA: Diagnosis not present

## 2023-08-25 DIAGNOSIS — Z7951 Long term (current) use of inhaled steroids: Secondary | ICD-10-CM | POA: Diagnosis not present

## 2023-08-25 DIAGNOSIS — J440 Chronic obstructive pulmonary disease with acute lower respiratory infection: Secondary | ICD-10-CM | POA: Diagnosis not present

## 2023-08-25 DIAGNOSIS — Z7985 Long-term (current) use of injectable non-insulin antidiabetic drugs: Secondary | ICD-10-CM | POA: Diagnosis not present

## 2023-08-25 DIAGNOSIS — Z7901 Long term (current) use of anticoagulants: Secondary | ICD-10-CM | POA: Diagnosis not present

## 2023-08-25 DIAGNOSIS — J9811 Atelectasis: Secondary | ICD-10-CM | POA: Diagnosis not present

## 2023-08-26 ENCOUNTER — Ambulatory Visit: Payer: 59 | Admitting: Internal Medicine

## 2023-08-27 ENCOUNTER — Ambulatory Visit: Payer: Self-pay

## 2023-08-27 DIAGNOSIS — E119 Type 2 diabetes mellitus without complications: Secondary | ICD-10-CM | POA: Diagnosis not present

## 2023-08-27 DIAGNOSIS — E785 Hyperlipidemia, unspecified: Secondary | ICD-10-CM | POA: Diagnosis not present

## 2023-08-27 DIAGNOSIS — I081 Rheumatic disorders of both mitral and tricuspid valves: Secondary | ICD-10-CM | POA: Diagnosis not present

## 2023-08-27 DIAGNOSIS — Z7952 Long term (current) use of systemic steroids: Secondary | ICD-10-CM | POA: Diagnosis not present

## 2023-08-27 DIAGNOSIS — Z7901 Long term (current) use of anticoagulants: Secondary | ICD-10-CM | POA: Diagnosis not present

## 2023-08-27 DIAGNOSIS — Z7951 Long term (current) use of inhaled steroids: Secondary | ICD-10-CM | POA: Diagnosis not present

## 2023-08-27 DIAGNOSIS — J9621 Acute and chronic respiratory failure with hypoxia: Secondary | ICD-10-CM | POA: Diagnosis not present

## 2023-08-27 DIAGNOSIS — I480 Paroxysmal atrial fibrillation: Secondary | ICD-10-CM | POA: Diagnosis not present

## 2023-08-27 DIAGNOSIS — Z9981 Dependence on supplemental oxygen: Secondary | ICD-10-CM | POA: Diagnosis not present

## 2023-08-27 DIAGNOSIS — E874 Mixed disorder of acid-base balance: Secondary | ICD-10-CM | POA: Diagnosis not present

## 2023-08-27 DIAGNOSIS — I3139 Other pericardial effusion (noninflammatory): Secondary | ICD-10-CM | POA: Diagnosis not present

## 2023-08-27 DIAGNOSIS — Z9181 History of falling: Secondary | ICD-10-CM | POA: Diagnosis not present

## 2023-08-27 DIAGNOSIS — J441 Chronic obstructive pulmonary disease with (acute) exacerbation: Secondary | ICD-10-CM | POA: Diagnosis not present

## 2023-08-27 DIAGNOSIS — Z7985 Long-term (current) use of injectable non-insulin antidiabetic drugs: Secondary | ICD-10-CM | POA: Diagnosis not present

## 2023-08-27 DIAGNOSIS — J440 Chronic obstructive pulmonary disease with acute lower respiratory infection: Secondary | ICD-10-CM | POA: Diagnosis not present

## 2023-08-27 DIAGNOSIS — Z794 Long term (current) use of insulin: Secondary | ICD-10-CM | POA: Diagnosis not present

## 2023-08-27 DIAGNOSIS — B9689 Other specified bacterial agents as the cause of diseases classified elsewhere: Secondary | ICD-10-CM | POA: Diagnosis not present

## 2023-08-27 DIAGNOSIS — I110 Hypertensive heart disease with heart failure: Secondary | ICD-10-CM | POA: Diagnosis not present

## 2023-08-27 DIAGNOSIS — J9811 Atelectasis: Secondary | ICD-10-CM | POA: Diagnosis not present

## 2023-08-27 DIAGNOSIS — I5043 Acute on chronic combined systolic (congestive) and diastolic (congestive) heart failure: Secondary | ICD-10-CM | POA: Diagnosis not present

## 2023-08-27 DIAGNOSIS — I2510 Atherosclerotic heart disease of native coronary artery without angina pectoris: Secondary | ICD-10-CM | POA: Diagnosis not present

## 2023-08-27 DIAGNOSIS — D649 Anemia, unspecified: Secondary | ICD-10-CM | POA: Diagnosis not present

## 2023-08-27 DIAGNOSIS — S3092XD Unspecified superficial injury of abdominal wall, subsequent encounter: Secondary | ICD-10-CM | POA: Diagnosis not present

## 2023-08-27 NOTE — Patient Outreach (Signed)
Care Coordination   Follow Up Visit Note   08/27/2023 Name: Alicia [name] MRN: [id number] DOB: [birth year]  Alicia [name] is a 67 y.o. year old female who sees Oswaldo Done, Marquita Palms, MD for primary care. I spoke with  Summers Miura Neubecker by phone today.  What matters to the patients health and wellness today?  During the conversation with Alicia [name], she reported an improvement in her condition after her recent hospitalization due to fluid retention and breathing difficulties. She is currently stable and reliant on oxygen. We reviewed the action plan and discussed additional heart failure management requirements. While in discussion, the [facility] nurse, Denny Peon, showed up.   During the visit, Denny Peon also measured Alicia [name]'s blood pressure, which was recorded at 138/86, indicating a favorable reading. I addressed an issue regarding [facility]. Alicia [name] has a home concentrator but lacks portable oxygen tanks for appointments. We called  [facility] at [phone], where it was conveyed that they are short-staffed with only one active driver, who is consistently occupied. Consequently, Alicia [name]'s oxygen delivery may be delayed until next week. She has had to cancel appointments due to the unavailability of oxygen.    Goals Addressed             This Visit's Progress    I want to learn about my CHF, COPD and keep it under control       Patient Goals/Self Care Activities: -Patient/Caregiver will self-administer medications as prescribed as evidenced by self-report/primary caregiver report  -Patient/Caregiver will attend all scheduled provider appointments as evidenced by clinician review of documented attendance to scheduled appointments and patient/caregiver report -Patient/Caregiver will call pharmacy for medication refills as evidenced by patient report and review of pharmacy fill history as appropriate -Patient/Caregiver will call provider office for new concerns or questions as evidenced by review  of documented incoming telephone call notes and patient report -Patient/Caregiver verbalizes understanding of plan -Patient/Caregiver will focus on medication adherence by taking medications as prescribed  -Weigh daily and record (notify MD with 3 lb weight gain over night or 5 lb in a week) -Follow CHF Action Plan -Adhere to low sodium diet  -Continue to be careful in the sun stay hydrated  Provided instruction about proper use of medications used for management of COPD including inhalers Advised patient to self assesses COPD action plan zone and make appointment with provider if in the yellow zone for 48 hours without improvement Discussed the importance of adequate rest and management of fatigue with COPD         SDOH assessments and interventions completed:  No     Care Coordination Interventions:  Yes, provided   Interventions Today    Flowsheet Row Most Recent Value  Chronic Disease   Chronic disease during today's visit Congestive Heart Failure (CHF), Chronic Obstructive Pulmonary Disease (COPD)  General Interventions   General Interventions Discussed/Reviewed General Interventions Discussed, General Interventions Reviewed, Communication with, Durable Medical Equipment (DME)  Durable Medical Equipment (DME) Oxygen  Communication with --  [[facility]]  Education Interventions   Education Provided Provided Education  Provided Verbal Education On Other  [COPD and CHF Action plan]  Pharmacy Interventions   Pharmacy Dicussed/Reviewed Pharmacy Topics Discussed  Safety Interventions   Safety Discussed/Reviewed Safety Discussed        Follow up plan: Follow up call scheduled for 09/01/23  130 pm    Encounter Outcome:  Patient Visit Completed   [name] RN, BSN, [facility]   Care Coordinator Phone:  [phone]

## 2023-08-27 NOTE — Patient Instructions (Signed)
Visit Information  Thank you for taking time to visit with me today. Please don't hesitate to contact me if I can be of assistance to you.   Following are the goals we discussed today:   Goals Addressed             This Visit's Progress    I want to learn about my CHF, COPD and keep it under control       Patient Goals/Self Care Activities: -Patient/Caregiver will self-administer medications as prescribed as evidenced by self-report/primary caregiver report  -Patient/Caregiver will attend all scheduled provider appointments as evidenced by clinician review of documented attendance to scheduled appointments and patient/caregiver report -Patient/Caregiver will call pharmacy for medication refills as evidenced by patient report and review of pharmacy fill history as appropriate -Patient/Caregiver will call provider office for new concerns or questions as evidenced by review of documented incoming telephone call notes and patient report -Patient/Caregiver verbalizes understanding of plan -Patient/Caregiver will focus on medication adherence by taking medications as prescribed  -Weigh daily and record (notify MD with 3 lb weight gain over night or 5 lb in a week) -Follow CHF Action Plan -Adhere to low sodium diet  -Continue to be careful in the sun stay hydrated  Provided instruction about proper use of medications used for management of COPD including inhalers Advised patient to self assesses COPD action plan zone and make appointment with provider if in the yellow zone for 48 hours without improvement Discussed the importance of adequate rest and management of fatigue with COPD         Our next appointment is by telephone on 09/01/23 at 130 pm  Please call the care guide team at [phone] if you need to cancel or reschedule your appointment.   If you are experiencing a Mental Health or Behavioral Health Crisis or need someone to talk to, please call 1-800-273-TALK (toll free, 24  hour hotline)  Patient verbalizes understanding of instructions and care plan provided today and agrees to view in MyChart. Active MyChart status and patient understanding of how to access instructions and care plan via MyChart confirmed with patient.     [name] RN, BSN, [facility] [employer]er Phone: [phone]

## 2023-08-30 ENCOUNTER — Telehealth (HOSPITAL_COMMUNITY): Payer: Self-pay

## 2023-08-30 NOTE — Telephone Encounter (Signed)
Left message for patient about scheduled time for appointment for tomorrow. Any questions to call office back. Number given

## 2023-08-31 ENCOUNTER — Ambulatory Visit (HOSPITAL_COMMUNITY): Payer: 59

## 2023-08-31 DIAGNOSIS — J9811 Atelectasis: Secondary | ICD-10-CM | POA: Diagnosis not present

## 2023-08-31 DIAGNOSIS — I081 Rheumatic disorders of both mitral and tricuspid valves: Secondary | ICD-10-CM | POA: Diagnosis not present

## 2023-08-31 DIAGNOSIS — D649 Anemia, unspecified: Secondary | ICD-10-CM | POA: Diagnosis not present

## 2023-08-31 DIAGNOSIS — J9621 Acute and chronic respiratory failure with hypoxia: Secondary | ICD-10-CM | POA: Diagnosis not present

## 2023-08-31 DIAGNOSIS — I2510 Atherosclerotic heart disease of native coronary artery without angina pectoris: Secondary | ICD-10-CM | POA: Diagnosis not present

## 2023-08-31 DIAGNOSIS — B9689 Other specified bacterial agents as the cause of diseases classified elsewhere: Secondary | ICD-10-CM | POA: Diagnosis not present

## 2023-08-31 DIAGNOSIS — I480 Paroxysmal atrial fibrillation: Secondary | ICD-10-CM | POA: Diagnosis not present

## 2023-08-31 DIAGNOSIS — I5043 Acute on chronic combined systolic (congestive) and diastolic (congestive) heart failure: Secondary | ICD-10-CM | POA: Diagnosis not present

## 2023-08-31 DIAGNOSIS — J440 Chronic obstructive pulmonary disease with acute lower respiratory infection: Secondary | ICD-10-CM | POA: Diagnosis not present

## 2023-08-31 DIAGNOSIS — E785 Hyperlipidemia, unspecified: Secondary | ICD-10-CM | POA: Diagnosis not present

## 2023-08-31 DIAGNOSIS — I110 Hypertensive heart disease with heart failure: Secondary | ICD-10-CM | POA: Diagnosis not present

## 2023-08-31 DIAGNOSIS — Z7952 Long term (current) use of systemic steroids: Secondary | ICD-10-CM | POA: Diagnosis not present

## 2023-08-31 DIAGNOSIS — Z7951 Long term (current) use of inhaled steroids: Secondary | ICD-10-CM | POA: Diagnosis not present

## 2023-08-31 DIAGNOSIS — E119 Type 2 diabetes mellitus without complications: Secondary | ICD-10-CM | POA: Diagnosis not present

## 2023-08-31 DIAGNOSIS — E874 Mixed disorder of acid-base balance: Secondary | ICD-10-CM | POA: Diagnosis not present

## 2023-08-31 DIAGNOSIS — Z794 Long term (current) use of insulin: Secondary | ICD-10-CM | POA: Diagnosis not present

## 2023-08-31 DIAGNOSIS — Z7985 Long-term (current) use of injectable non-insulin antidiabetic drugs: Secondary | ICD-10-CM | POA: Diagnosis not present

## 2023-08-31 DIAGNOSIS — I3139 Other pericardial effusion (noninflammatory): Secondary | ICD-10-CM | POA: Diagnosis not present

## 2023-08-31 DIAGNOSIS — Z9181 History of falling: Secondary | ICD-10-CM | POA: Diagnosis not present

## 2023-08-31 DIAGNOSIS — J441 Chronic obstructive pulmonary disease with (acute) exacerbation: Secondary | ICD-10-CM | POA: Diagnosis not present

## 2023-08-31 DIAGNOSIS — Z7901 Long term (current) use of anticoagulants: Secondary | ICD-10-CM | POA: Diagnosis not present

## 2023-08-31 DIAGNOSIS — Z9981 Dependence on supplemental oxygen: Secondary | ICD-10-CM | POA: Diagnosis not present

## 2023-08-31 DIAGNOSIS — S3092XD Unspecified superficial injury of abdominal wall, subsequent encounter: Secondary | ICD-10-CM | POA: Diagnosis not present

## 2023-09-01 ENCOUNTER — Telehealth: Payer: Self-pay | Admitting: Student in an Organized Health Care Education/Training Program

## 2023-09-01 ENCOUNTER — Ambulatory Visit: Payer: Self-pay

## 2023-09-01 NOTE — Telephone Encounter (Signed)
Called pt - no answer; left message to call the office . 

## 2023-09-01 NOTE — Telephone Encounter (Signed)
Return pt's call - no answer; left message of my return call.

## 2023-09-01 NOTE — Telephone Encounter (Signed)
Ok, thank you. Shaking is not a typical side effect of either of those medications. Would continue the eliquis in the meantime, ok to hold the spironolactone until her visit if she would like.

## 2023-09-01 NOTE — Patient Instructions (Signed)
Visit Information  Thank you for taking time to visit with me today. Please don't hesitate to contact me if I can be of assistance to you.   Following are the goals we discussed today:   Goals Addressed             This Visit's Progress    I want to learn about my CHF, COPD and keep it under control       Patient Goals/Self Care Activities: -Patient/Caregiver will self-administer medications as prescribed as evidenced by self-report/primary caregiver report  -Patient/Caregiver will attend all scheduled provider appointments as evidenced by clinician review of documented attendance to scheduled appointments and patient/caregiver report -Patient/Caregiver will call pharmacy for medication refills as evidenced by patient report and review of pharmacy fill history as appropriate -Patient/Caregiver will call provider office for new concerns or questions as evidenced by review of documented incoming telephone call notes and patient report -Patient/Caregiver verbalizes understanding of plan -Patient/Caregiver will focus on medication adherence by taking medications as prescribed  -Weigh daily and record (notify MD with 3 lb weight gain over night or 5 lb in a week) -Follow CHF Action Plan -Adhere to low sodium diet  -Continue to be careful in the sun stay hydrated Provided instruction about proper use of medications used for management of COPD including inhalers Advised patient to self assesses COPD action plan zone and make appointment with provider if in the yellow zone for 48 hours without improvement Discussed the importance of adequate rest and management of fatigue with COPD Continue to evaluate your condition If it becomes worse before your appointment call 911         Our next appointment is by telephone on 09/09/23 at 1 pm  Please call the care guide team at [phone] if you need to cancel or reschedule your appointment.   If you are experiencing a Mental Health or  Behavioral Health Crisis or need someone to talk to, please call 1-800-273-TALK (toll free, 24 hour hotline)  Patient verbalizes understanding of instructions and care plan provided today and agrees to view in MyChart. Active MyChart status and patient understanding of how to access instructions and care plan via MyChart confirmed with patient.     [name] RN, BSN, [facility] [employer] Phone: [phone]

## 2023-09-01 NOTE — Patient Outreach (Signed)
[name] Coordination   Follow Up Visit Note   09/01/2023 Name: Alicia [name] MRN: [id number] DOB: [birth year]  Alicia [name] is a 67 y.o. year old female who sees Oswaldo Done, Marquita Palms, MD for primary [name]. I spoke with  Alicia [name] by phone today.  What matters to the patients health and wellness today?  I spoke with Alicia [name] today, and she mentioned that something didn't feel right after taking her medicine. She's been experiencing a lot of shaking and feels like she has a slight cold. She also mentioned that her breathing is short with exertion and a slight cough. However, she denied experiencing any swelling or chest pain. Alicia [name] is currently using her oxygen. She informed me that Alicia [name] delivered her oxygen last August at 7:00 PM. She has scheduled an appointment for a follow-up on October 11th at [facility]. She mentioned that she couldn't get an earlier appointment because that was the only time she had a ride to take her, but she understands that if things get worse, she will call 911.     Goals Addressed             This Visit's Progress    I want to learn about my CHF, COPD and keep it under control       Patient Goals/Self [name] Activities: -Patient/Caregiver will self-administer medications as prescribed as evidenced by self-report/primary caregiver report  -Patient/Caregiver will attend all scheduled provider appointments as evidenced by clinician review of documented attendance to scheduled appointments and patient/caregiver report -Patient/Caregiver will call pharmacy for medication refills as evidenced by patient report and review of pharmacy fill history as appropriate -Patient/Caregiver will call provider office for new concerns or questions as evidenced by review of documented incoming telephone call notes and patient report -Patient/Caregiver verbalizes understanding of plan -Patient/Caregiver will focus on medication adherence by taking medications as prescribed   -Weigh daily and record (notify MD with 3 lb weight gain over night or 5 lb in a week) -Follow CHF Action Plan -Adhere to low sodium diet  -Continue to be careful in the sun stay hydrated Provided instruction about proper use of medications used for management of COPD including inhalers Advised patient to self assesses COPD action plan zone and make appointment with provider if in the yellow zone for 48 hours without improvement Discussed the importance of adequate rest and management of fatigue with COPD Continue to evaluate your condition If it becomes worse before your appointment call 911         SDOH assessments and interventions completed:  No     [name] Coordination Interventions:  Yes, provided   Interventions Today    Flowsheet Row Most Recent Value  Chronic Disease   Chronic disease during today's visit Chronic Obstructive Pulmonary Disease (COPD), Congestive Heart Failure (CHF)  General Interventions   General Interventions Discussed/Reviewed General Interventions Discussed  Durable Medical Equipment (DME) Oxygen  Pharmacy Interventions   Pharmacy Dicussed/Reviewed Pharmacy Topics Discussed  Safety Interventions   Safety Discussed/Reviewed Safety Discussed, Safety Reviewed        Follow up plan: Follow up call scheduled for 09/09/23  1 pm    Encounter Outcome:  Patient Visit Completed   [name] RN, BSN, [facility]   [name] Coordinator Phone: [phone]

## 2023-09-01 NOTE — Telephone Encounter (Signed)
Return pt's call - informed of Dr Vincent's response and voiced understanding.

## 2023-09-01 NOTE — Telephone Encounter (Signed)
Pt calling to report she believes the 2 following medications are causing her to shake really bad.   spironolactone (ALDACTONE) 25 MG tablet   apixaban (ELIQUIS) 5 MG TABS tablet

## 2023-09-01 NOTE — Telephone Encounter (Signed)
Return call from pt - pt stated after she takes these 2 meds (Eliquis and Spironolactone) , she starts shaking. Stated she checks her BS and it is fine. Appt given [place] 10/11 with Dr Rosaura Carpenter.

## 2023-09-03 ENCOUNTER — Ambulatory Visit: Payer: 59 | Admitting: Student

## 2023-09-03 VITALS — BP 181/73 | HR 70 | Temp 98.2°F | Ht 67.0 in | Wt 214.7 lb

## 2023-09-03 DIAGNOSIS — I5032 Chronic diastolic (congestive) heart failure: Secondary | ICD-10-CM

## 2023-09-03 DIAGNOSIS — J9621 Acute and chronic respiratory failure with hypoxia: Secondary | ICD-10-CM | POA: Diagnosis not present

## 2023-09-03 DIAGNOSIS — E874 Mixed disorder of acid-base balance: Secondary | ICD-10-CM | POA: Diagnosis not present

## 2023-09-03 DIAGNOSIS — Z7985 Long-term (current) use of injectable non-insulin antidiabetic drugs: Secondary | ICD-10-CM | POA: Diagnosis not present

## 2023-09-03 DIAGNOSIS — Z7952 Long term (current) use of systemic steroids: Secondary | ICD-10-CM | POA: Diagnosis not present

## 2023-09-03 DIAGNOSIS — I10 Essential (primary) hypertension: Secondary | ICD-10-CM

## 2023-09-03 DIAGNOSIS — D649 Anemia, unspecified: Secondary | ICD-10-CM | POA: Diagnosis not present

## 2023-09-03 DIAGNOSIS — J9811 Atelectasis: Secondary | ICD-10-CM | POA: Diagnosis not present

## 2023-09-03 DIAGNOSIS — E113393 Type 2 diabetes mellitus with moderate nonproliferative diabetic retinopathy without macular edema, bilateral: Secondary | ICD-10-CM | POA: Diagnosis not present

## 2023-09-03 DIAGNOSIS — Z9181 History of falling: Secondary | ICD-10-CM | POA: Diagnosis not present

## 2023-09-03 DIAGNOSIS — I480 Paroxysmal atrial fibrillation: Secondary | ICD-10-CM | POA: Diagnosis not present

## 2023-09-03 DIAGNOSIS — I3139 Other pericardial effusion (noninflammatory): Secondary | ICD-10-CM | POA: Diagnosis not present

## 2023-09-03 DIAGNOSIS — Z7951 Long term (current) use of inhaled steroids: Secondary | ICD-10-CM | POA: Diagnosis not present

## 2023-09-03 DIAGNOSIS — S3092XD Unspecified superficial injury of abdominal wall, subsequent encounter: Secondary | ICD-10-CM | POA: Diagnosis not present

## 2023-09-03 DIAGNOSIS — Z794 Long term (current) use of insulin: Secondary | ICD-10-CM | POA: Diagnosis not present

## 2023-09-03 DIAGNOSIS — E119 Type 2 diabetes mellitus without complications: Secondary | ICD-10-CM | POA: Diagnosis not present

## 2023-09-03 DIAGNOSIS — I5043 Acute on chronic combined systolic (congestive) and diastolic (congestive) heart failure: Secondary | ICD-10-CM | POA: Diagnosis not present

## 2023-09-03 DIAGNOSIS — Z87891 Personal history of nicotine dependence: Secondary | ICD-10-CM | POA: Diagnosis not present

## 2023-09-03 DIAGNOSIS — I081 Rheumatic disorders of both mitral and tricuspid valves: Secondary | ICD-10-CM | POA: Diagnosis not present

## 2023-09-03 DIAGNOSIS — E785 Hyperlipidemia, unspecified: Secondary | ICD-10-CM | POA: Diagnosis not present

## 2023-09-03 DIAGNOSIS — I5020 Unspecified systolic (congestive) heart failure: Secondary | ICD-10-CM

## 2023-09-03 DIAGNOSIS — J440 Chronic obstructive pulmonary disease with acute lower respiratory infection: Secondary | ICD-10-CM | POA: Diagnosis not present

## 2023-09-03 DIAGNOSIS — J439 Emphysema, unspecified: Secondary | ICD-10-CM | POA: Diagnosis not present

## 2023-09-03 DIAGNOSIS — I110 Hypertensive heart disease with heart failure: Secondary | ICD-10-CM | POA: Diagnosis not present

## 2023-09-03 DIAGNOSIS — J441 Chronic obstructive pulmonary disease with (acute) exacerbation: Secondary | ICD-10-CM | POA: Diagnosis not present

## 2023-09-03 DIAGNOSIS — I2510 Atherosclerotic heart disease of native coronary artery without angina pectoris: Secondary | ICD-10-CM | POA: Diagnosis not present

## 2023-09-03 DIAGNOSIS — Z7901 Long term (current) use of anticoagulants: Secondary | ICD-10-CM | POA: Diagnosis not present

## 2023-09-03 DIAGNOSIS — B9689 Other specified bacterial agents as the cause of diseases classified elsewhere: Secondary | ICD-10-CM | POA: Diagnosis not present

## 2023-09-03 DIAGNOSIS — Z9981 Dependence on supplemental oxygen: Secondary | ICD-10-CM | POA: Diagnosis not present

## 2023-09-03 LAB — GLUCOSE, CAPILLARY: Glucose-Capillary: 165 mg/dL — ABNORMAL HIGH (ref 70–99)

## 2023-09-03 LAB — BASIC METABOLIC PANEL
Anion gap: 12 (ref 5–15)
BUN: 12 mg/dL (ref 8–23)
CO2: 33 mmol/L — ABNORMAL HIGH (ref 22–32)
Calcium: 8.5 mg/dL — ABNORMAL LOW (ref 8.9–10.3)
Chloride: 93 mmol/L — ABNORMAL LOW (ref 98–111)
Creatinine, Ser: 0.75 mg/dL (ref 0.44–1.00)
GFR, Estimated: >60 mL/min (ref >60)
Glucose, Bld: 200 mg/dL — ABNORMAL HIGH (ref 70–99)
Potassium: 4.1 mmol/L (ref 3.5–5.1)
Sodium: 138 mmol/L (ref 135–145)

## 2023-09-03 LAB — POCT GLYCOSYLATED HEMOGLOBIN (HGB A1C): Hemoglobin A1C: 8.1 % — AB (ref 4.0–5.6)

## 2023-09-03 MED ORDER — TRELEGY ELLIPTA 200-62.5-25 MCG/ACT IN AEPB
1.0000 | INHALATION_SPRAY | Freq: Every day | RESPIRATORY_TRACT | 5 refills | Status: DC
Start: 2023-09-03 — End: 2023-11-01

## 2023-09-03 NOTE — Assessment & Plan Note (Addendum)
Worsening of HF signs and symptoms since discharge.  Discharge weight was around 210, weight a today 214.  Increased O2 requirement, on 8 L today.  Worsening of her exertional dyspnea. This seems relatively stable from her recent telephone visit from 09/30, but worse compared to her discharge condition. She will need routine follow up to continue to work on getting her outpatient HF regiment calibrated and avoid hospitalization. She does try to be very compliant with her medications.  She has been taking her Demadex only twice daily instead of 3 times daily.  She says it is because it was making her pee a lot. I discussed with her the pathophysiology of heart failure exacerbation in the importance of maintaining a good fluid balance.  Discussed volume restriction (not to exceed 2 L at most).  Discussed the importance of taking her diuretic.  Discussed importance of low-sodium diet.  Stat BMP obtained at today's visit and discussed with the patient with a telephone call.  Negative for electrolyte derangement in the setting of diuresis. Good kidney function.  Plan: Fluid restriction, salt restriction Demadex 3 times daily as recommended upon discharge. Close follow-up with me in 3 days for repeat exam. Daily weights in AM, will bring in log

## 2023-09-03 NOTE — Progress Notes (Signed)
Subjective:  CC: HFU, COPD, CHF, Hypoxic resp failture  HPI:  [name] is a 67 y.o. person with a past medical history stated below and presents today for hospital follow up. Please see problem based assessment and plan for additional details.  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  Non-functioning   Aortic atherosclerosis (HCC) 09/24/2017   Asymptomatic, found on CT scan   Blood transfusion without reported diagnosis    Chronic Systolic Heart Failure 05/10/2014   Felt to be non-ischemic and secondary to hypertension.  Echo (05/28/2014): LVEF 25%.  Is not interested in AICD placement.   COPD exacerbation (HCC)    Coronary artery disease involving native coronary artery of native heart without angina pectoris 11/09/2014   Cardiac cath (02/18/2014): Non-obstructive, mRCA 30%, dRCA 60%   Cystocele with uterine prolapse - grade 3 02/12/2016   Not interested in pessary after trying   Diverticulosis of colon 09/24/2017   Diverticulosis of colon 09/24/2017   Essential hypertension 10/15/2013   Gastroesophageal reflux disease 09/24/2017   History of cerebrovascular accident 05/10/2013   Per patient report 6 previous strokes, most recent on 05/10/13.  No residual deficits.   History of cerebrovascular accident 05/10/2013   Per patient report 6 previous strokes, most recent on 05/10/13.  No residual deficits.   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  (HCC) 10/15/2013    Current Outpatient Medications on File Prior to Visit  Medication Sig Dispense Refill   albuterol (PROVENTIL) (2.5 MG/3ML) 0.083% nebulizer solution INHALE 3 ML BY NEBULIZATION EVERY 6 HOURS AS NEEDED FOR WHEEZING OR SHORTNESS OF BREATH 75 mL 12   apixaban (ELIQUIS) 5 MG TABS tablet Take 1 tablet (5 mg total) by mouth 2 (two) times daily. 60 tablet 11   atorvastatin (LIPITOR) 80 MG tablet TAKE 1 TABLET BY MOUTH EVERY DAY 90 tablet 3   Dulaglutide (TRULICITY) 1.5 MG/0.5ML SOPN Inject 1.5 mg into the skin once a week. (Patient not taking: Reported on 08/08/2023) 2 mL 5   ezetimibe (ZETIA) 10 MG tablet TAKE 1 TABLET BY MOUTH EVERY DAY 90 tablet 3   glucose blood (ACCU-CHEK AVIVA PLUS) test strip 1 each by Other route See admin instructions. USE TO TEST BLOOD SUGARS AS DIRECTED 100 strip 3   insulin glargine (LANTUS) 100 UNIT/ML Solostar Pen Inject 20 Units into the skin daily. 15 mL 3   insulin lispro (HUMALOG) 100 UNIT/ML KwikPen Inject 14 Units into the skin 3 (three) times daily before meals. Only take if eating a meal AND Blood Glucose (BG) is 80 or higher. 15 mL 3   Insulin Syringe-Needle U-100 31G X 15/64" 0.3 ML MISC Use to inject Humalog before meals three times a day 100 each 5   Lancets (ACCU-CHEK MULTICLIX) lancets Use to check your blood sugar four times daily: early morning, before a meal, two hours after a meal, and bedtime 100 each 12   sacubitril-valsartan (ENTRESTO) 49-51 MG  Take 1 tablet by mouth 2 (two) times daily. 60 tablet 2   spironolactone (ALDACTONE) 25 MG tablet Take 1 tablet (25 mg total) by mouth daily. 30 tablet 2   torsemide (DEMADEX) 20 MG tablet Take 3 tablets (60 mg total) by mouth daily. 90 tablet 2   No current facility-administered medications on file prior to visit.    Review of Systems: Please see assessment and plan for pertinent positives and negatives.  Objective:   Vitals:   09/03/23 0919 09/03/23 0938  BP: (!) 185/68 (!) 181/73   Pulse: 73 70  Temp: 98.2 F (36.8 C)   TempSrc: Oral   SpO2: 92%   Weight: 214 lb 11.2 oz (97.4 kg) 214 lb 11.2 oz (97.4 kg)  Height: 5\' 7"  (1.702 m)     Physical Exam: Constitutional: in no acute distress Cardiovascular: regular rate and rhythm, no m/r/g. Prominent S2 Pulmonary/Chest: on 8L [place], Poor respiratory effort, faint scattered wheezing. Crackles at the lung bases. Abdominal: soft, non-tender, non-distended Extremities: 2+ edema to lower shin Psych: Pleasant Affect     Assessment & Plan:  Essential hypertension BP 181/73 today. She stopped taking her Spironolactone d/t tremors. After discontinuing her spironolactone her tremors resolved. Unclear why she would have this side effect. Doubt electrolyte derangement - BMP relatively unremarkable today. She is volume up on exam, which is likely contributing to her HTN. Plan: Will address volume status  Close follow up (3 days). Daily BP, will bring in log   Heart failure with recovered ejection fraction (HFrecEF) (HCC) Worsening of HF signs and symptoms since discharge.  Discharge weight was around 210, weight a today 214.  Increased O2 requirement, on 8 L today.  Worsening of her exertional dyspnea. This seems relatively stable from her recent telephone visit from 09/30, but worse compared to her discharge condition. She will need routine follow up to continue to work on getting her outpatient HF regiment calibrated and avoid hospitalization. She does try to be very compliant with her medications.  She has been taking her Demadex only twice daily instead of 3 times daily.  She says it is because it was making her pee a lot. I discussed with her the pathophysiology of heart failure exacerbation in the importance of maintaining a good fluid balance.  Discussed volume restriction (not to exceed 2 L at most).  Discussed the importance of taking her diuretic.  Discussed importance of low-sodium diet.  Stat BMP obtained at today's  visit and discussed with the patient with a telephone call.  Negative for electrolyte derangement in the setting of diuresis. Good kidney function.  Plan: Fluid restriction, salt restriction Demadex 3 times daily as recommended upon discharge. Close follow-up with me in 3 days for repeat exam. Daily weights in AM, will bring in log    COPD (chronic obstructive pulmonary disease) (HCC) Patient has increasing oxygen requirement, on 8 L here and 6 L at home.  She was discharged on 4 L.  She does have some wheezing on exam, may be in the setting of COPD versus cardiac wheeze.  She did not ever begin the Trelegy inhaler, has continued taking her Dulera. Does claim she had recent "head cold" which made it worse. Plan: Refilled Trelegy, instructed patient to take this instead of Dulera.   Type 2 diabetes mellitus with moderate nonproliferative diabetic retinopathy (HCC) Has been compliant with her insulin regiment.  A1c today 8.1, stable from prior.  Would like to see better glycemic control, but addressing cardiovascular  and pulmonary pathology likely take precedence for now.   Patient seen with Dr. Elliot Cousin MD [facility]  PGY-1 Pager: [phone]  Phone: [phone] Date 09/03/2023  Time 9:59 PM

## 2023-09-03 NOTE — Assessment & Plan Note (Signed)
Has been compliant with her insulin regiment.  A1c today 8.1, stable from prior.  Would like to see better glycemic control, but addressing cardiovascular and pulmonary pathology likely take precedence for now.

## 2023-09-03 NOTE — Assessment & Plan Note (Addendum)
BP 181/73 today. She stopped taking her Spironolactone d/t tremors. After discontinuing her spironolactone her tremors resolved. Unclear why she would have this side effect. Doubt electrolyte derangement - BMP relatively unremarkable today. She is volume up on exam, which is likely contributing to her HTN. Plan: Will address volume status  Close follow up (3 days). Daily BP, will bring in log

## 2023-09-03 NOTE — Assessment & Plan Note (Addendum)
Patient has increasing oxygen requirement, on 8 L here and 6 L at home.  She was discharged on 4 L.  She does have some wheezing on exam, may be in the setting of COPD versus cardiac wheeze.  She did not ever begin the Trelegy inhaler, has continued taking her Dulera. Does claim she had recent "head cold" which made it worse. Plan: Refilled Trelegy, instructed patient to take this instead of Dulera.

## 2023-09-03 NOTE — Patient Instructions (Addendum)
Thank you, [name] for allowing [name] to provide your care today.  I have ordered the following tests for you:  Lab Orders         Glucose, capillary         BMP8+Anion Gap         POC Hbg A1C       Follow up:  With Dr. Rosaura Carpenter on Monday  for follow up on volume  Please remember:  Weigh yourself every morning, and bring that sheet with you Check your blood pressure every morning and bring that with you Take three of the Demadex (peeing) pills a day  If your weight goes up by more than three pounds in one day, or if your symptoms get worse and you need more oxygen or are having trouble breathing - please call us right away.  [phone] - Call and say you need to speak with the on call medicine resident.     We look forward to seeing you next time. Please call our clinic at [phone] if you have any questions or concerns. The best time to call is Monday-[place] from 9am-4pm, but there is someone available 24/7. If after hours or the weekend, call the main hospital number and ask for the Internal Medicine Resident On-Call. If you need medication refills, please notify your pharmacy one week in advance and they will send [name] a request.   Thank you for trusting me with your care. Wishing you the best!  Lovie Macadamia MD [facility]

## 2023-09-06 ENCOUNTER — Ambulatory Visit: Payer: 59 | Admitting: Student

## 2023-09-06 ENCOUNTER — Telehealth: Payer: Self-pay | Admitting: *Deleted

## 2023-09-06 ENCOUNTER — Encounter: Payer: Self-pay | Admitting: Student

## 2023-09-06 VITALS — BP 154/80 | HR 72 | Temp 98.2°F | Wt 209.3 lb

## 2023-09-06 DIAGNOSIS — I5032 Chronic diastolic (congestive) heart failure: Secondary | ICD-10-CM | POA: Diagnosis not present

## 2023-09-06 DIAGNOSIS — J439 Emphysema, unspecified: Secondary | ICD-10-CM | POA: Diagnosis not present

## 2023-09-06 DIAGNOSIS — Z0000 Encounter for general adult medical examination without abnormal findings: Secondary | ICD-10-CM

## 2023-09-06 MED ORDER — ALBUTEROL SULFATE HFA 108 (90 BASE) MCG/ACT IN AERS: 2.0000 | INHALATION_SPRAY | Freq: Four times a day (QID) | RESPIRATORY_TRACT | 2 refills | Status: DC | PRN

## 2023-09-06 NOTE — Telephone Encounter (Signed)
Called patient to follow up her to see if she had scheduled her mammogram. Patient  states she will call them was just getting home. Will follow up to see if appointment made.

## 2023-09-06 NOTE — Progress Notes (Signed)
Internal Medicine Clinic Attending  I was physically present during the key portions of the resident provided service and participated in the medical decision making of patient's management care. I reviewed pertinent patient test results.  The assessment, diagnosis, and plan were formulated together and I agree with the documentation in the resident's note.  Mercie Eon, MD     This is a chronically ill patient who I know well. She has chronic hypoxic respiratory failure and has had frequent admissions for COPD & CHF exacerbations.   I agree will plan to increase Torsemide from 40mg  to 60mg  daily, then reassess her in clinic in 3 days.   Patient is here with her daughter today - they live together. We counseled them to check daily standing weight every morning, and call us if weight increases by >3 pounds. They will bring a log of weights with them to clinic on Monday.

## 2023-09-06 NOTE — Patient Instructions (Addendum)
Thank you, [name] for allowing [name] to provide your care today.   I have ordered the following medication/changed the following medications:   Start the following medications: Meds ordered this encounter  Medications   albuterol (VENTOLIN HFA) 108 (90 Base) MCG/ACT inhaler    Sig: Inhale 2 puffs into the lungs every 6 (six) hours as needed for wheezing or shortness of breath.    Dispense:  8 g    Refill:  2     Follow up:  4 weeks  for follow up with Dr. Oswaldo Done   Please remember: Keep taking your blood pressures and weights at home. If you lose weight quickly or have symptoms like dizziness, give [name] a call.      We look forward to seeing you next time. Please call our clinic at [phone] if you have any questions or concerns. The best time to call is Monday-[place] from 9am-4pm, but there is someone available 24/7. If after hours or the weekend, call the main hospital number and ask for the Internal Medicine Resident On-Call. If you need medication refills, please notify your pharmacy one week in advance and they will send [name] a request.   Thank you for trusting me with your care. Wishing you the best!  Lovie Macadamia MD [facility]

## 2023-09-07 NOTE — Assessment & Plan Note (Signed)
Patient on 8 L oxygen here again today.  O2 sat at 96% while sitting.  It is difficult to get understanding of her baseline, per our note she was discharged on 4 L O2.  Per her daughter her oxygen requirement has not really changed much since her discharge.  She was previously taking only Dulera, had been discharged with Trelegy but had not picked it up.  She did pick up her Trelegy since last visit, and has begun using that which she feels has been helpful.  Her wheezing has resolved for me on exam. Plan: Continue Trelegy Continue to monitor oxygen requirement

## 2023-09-07 NOTE — Progress Notes (Signed)
Subjective:  CC: HF, COPD follow up  HPI:  [name] is a 67 y.o. person with a past medical history stated below and presents today for HF and COPD follow up. Please see problem based assessment and plan for additional details.  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  Non-functioning   Aortic atherosclerosis (HCC) 09/24/2017   Asymptomatic, found on CT scan   Blood transfusion without reported diagnosis    Chronic Systolic Heart Failure 05/10/2014   Felt to be non-ischemic and secondary to hypertension.  Echo (05/28/2014): LVEF 25%.  Is not interested in AICD placement.   COPD exacerbation (HCC)    Coronary artery disease involving native coronary artery of native heart without angina pectoris 11/09/2014   Cardiac cath (02/18/2014): Non-obstructive, mRCA 30%, dRCA 60%   Cystocele with uterine prolapse - grade 3 02/12/2016   Not interested in pessary after trying   Diverticulosis of colon 09/24/2017   Diverticulosis of colon 09/24/2017   Essential hypertension 10/15/2013   Gastroesophageal reflux disease 09/24/2017   History of cerebrovascular accident 05/10/2013   Per patient report 6 previous strokes, most recent on 05/10/13.  No residual deficits.   History of cerebrovascular accident 05/10/2013   Per patient report 6 previous strokes, most recent on 05/10/13.  No residual deficits.   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 (HCC) 10/15/2013     Current Outpatient Medications on File Prior to Visit  Medication Sig Dispense Refill   albuterol (PROVENTIL) (2.5 MG/3ML) 0.083% nebulizer solution INHALE 3 ML BY NEBULIZATION EVERY 6 HOURS AS NEEDED FOR WHEEZING OR SHORTNESS OF BREATH 75 mL 12   apixaban (ELIQUIS) 5 MG TABS tablet Take 1 tablet (5 mg total) by mouth 2 (two) times daily. 60 tablet 11   atorvastatin (LIPITOR) 80 MG tablet TAKE 1 TABLET BY MOUTH EVERY DAY 90 tablet 3   Dulaglutide (TRULICITY) 1.5 MG/0.5ML SOPN Inject 1.5 mg into the skin once a week. (Patient not taking: Reported on 08/08/2023) 2 mL 5   ezetimibe (ZETIA) 10 MG tablet TAKE 1 TABLET BY MOUTH EVERY DAY 90 tablet 3   Fluticasone-Umeclidin-Vilant (TRELEGY ELLIPTA) 200-62.5-25 MCG/ACT AEPB Inhale 1 puff into the lungs daily. 60 each 5   glucose blood (ACCU-CHEK AVIVA PLUS) test strip 1 each by Other route See admin instructions. USE TO TEST BLOOD SUGARS AS DIRECTED 100 strip 3   insulin glargine (LANTUS) 100 UNIT/ML Solostar Pen Inject 20 Units into the skin daily. 15 mL 3   insulin lispro (HUMALOG) 100 UNIT/ML KwikPen Inject 14 Units into the skin 3 (three) times daily before meals. Only take if eating a meal AND Blood Glucose (BG) is 80 or higher. 15 mL 3   Insulin Syringe-Needle U-100 31G X 15/64" 0.3 ML MISC Use to inject Humalog before meals three times a day 100 each 5   Lancets (ACCU-CHEK MULTICLIX) lancets Use to check your blood sugar four times daily: early morning, before  a meal, two hours after a meal, and bedtime 100 each 12   sacubitril-valsartan (ENTRESTO) 49-51 MG Take 1 tablet by mouth 2 (two) times daily. 60 tablet 2   spironolactone (ALDACTONE) 25 MG tablet Take 1 tablet (25 mg total) by mouth daily. 30 tablet 2   torsemide (DEMADEX) 20 MG tablet Take 3 tablets (60 mg total) by mouth daily. 90 tablet 2   No current facility-administered medications on file prior to visit.    Review of Systems: Please see assessment and plan for pertinent  positives and negatives.  Objective:   Vitals:   09/06/23 1320 09/06/23 1417  BP: (!) 166/79 (!) 154/80  Pulse: 72 72  Temp: 98.2 F (36.8 C)   TempSrc: Oral   SpO2: 96%   Weight: 209 lb 4.8 oz (94.9 kg)     Physical Exam: Constitutional: Well-appearing in no acute distress Cardiovascular: regular rate and rhythm, no m/r/g Pulmonary/Chest: Bilateral crackles on auscultation at the lung bases. On 8L . Abdominal: soft, non-tender, non-distended Extremities: 1+ edema left greater than right to the lower shin. Skin: warm and dry Psych: Pleasant mood and affect   Assessment & Plan:  Healthcare maintenance Patient due for mammogram, screening mammogram ordered.   COPD (chronic obstructive pulmonary disease) (HCC) Patient on 8 L oxygen here again today.  O2 sat at 96% while sitting.  It is difficult to get understanding of her baseline, per our note she was discharged on 4 L O2.  Per her daughter her oxygen requirement has not really changed much since her discharge.  She was previously taking only Dulera, had been discharged with Trelegy but had not picked it up.  She did pick up her Trelegy since last visit, and has begun using that which she feels has been helpful.  Her wheezing has resolved for me on exam. Plan: Continue Trelegy Continue to monitor oxygen requirement   Heart failure with recovered ejection fraction (HFrecEF) (HCC) Patient was instructed to restrict her fluid, restrict her sodium intake, and take her diuretic 3 times daily rather than twice daily which she has been doing at home.  She is on 60 mg Demadex daily.  She has been taking 2 in the morning and 1 in the afternoon, and says that this has been working well for her.  On our scale today she has lost 5 pounds.  Looking at her home weights which she brought in for as she has lost about 2 pounds.  She feels that her lower extremity edema is improved.  She does have left worse than right lower extremity edema, no  signs of DVT.  She does hang her left leg off of the bed which may contrary to this.  We discussed the pathophysiology of her heart failure and importance of taking her diuretic, managing her fluids, and managing her salt intake.  We discussed that this will result in edema and shortness of breath.  We also discussed the signs and symptoms of hypovolemia and instructed her and her daughter to look out for these and let us know if she does have any symptoms such as dizziness, lightheadedness, or presyncope.  She will take her weights at home, continue to let us if she drops a significant amount of weight.  On exam for me her lower extremity edema is slightly improved, still 1+ to lower shin on the left side.  Bilateral pedal edema, improved from last time.  She does have persistent crackles on pulmonary auscultation, but no significant JVD  that I can appreciate today.  Plan: Continue current regiment, Demadex 60 mg daily Patient educated regarding her disease process Will follow-up with [name] in 1 month, we will bring in her weights and blood pressure log at next visit.  She will also bring in her medications at next visit. Signs and symptoms discussed/return precautions given for hyper or hypovolemia. Discussed wearing compression stocking to aid with mobilization of her extra volume.    Patient seen with Dr. Sullivan Lone MD [facility]  PGY-1 Pager: [phone]  Phone: [phone] Date 09/07/2023  Time 1:46 PM

## 2023-09-07 NOTE — Assessment & Plan Note (Addendum)
Patient was instructed to restrict her fluid, restrict her sodium intake, and take her diuretic 3 times daily rather than twice daily which she has been doing at home.  She is on 60 mg Demadex daily.  She has been taking 2 in the morning and 1 in the afternoon, and says that this has been working well for her.  On our scale today she has lost 5 pounds.  Looking at her home weights which she brought in for as she has lost about 2 pounds.  She feels that her lower extremity edema is improved.  She does have left worse than right lower extremity edema, no signs of DVT.  She does hang her left leg off of the bed which may contrary to this.  We discussed the pathophysiology of her heart failure and importance of taking her diuretic, managing her fluids, and managing her salt intake.  We discussed that this will result in edema and shortness of breath.  We also discussed the signs and symptoms of hypovolemia and instructed her and her daughter to look out for these and let us know if she does have any symptoms such as dizziness, lightheadedness, or presyncope.  She will take her weights at home, continue to let us if she drops a significant amount of weight.  On exam for me her lower extremity edema is slightly improved, still 1+ to lower shin on the left side.  Bilateral pedal edema, improved from last time.  She does have persistent crackles on pulmonary auscultation, but no significant JVD that I can appreciate today.  Plan: Continue current regiment, Demadex 60 mg daily Patient educated regarding her disease process Will follow-up with [name] in 1 month, we will bring in her weights and blood pressure log at next visit.  She will also bring in her medications at next visit. Signs and symptoms discussed/return precautions given for hyper or hypovolemia. Discussed wearing compression stocking to aid with mobilization of her extra volume.

## 2023-09-07 NOTE — Assessment & Plan Note (Signed)
Patient due for mammogram, screening mammogram ordered.

## 2023-09-08 ENCOUNTER — Ambulatory Visit (HOSPITAL_COMMUNITY): Payer: 59

## 2023-09-08 NOTE — Progress Notes (Signed)
Internal Medicine Clinic Attending  I was physically present during the key portions of the resident provided service and participated in the medical decision making of patient's management care. I reviewed pertinent patient test results.  The assessment, diagnosis, and plan were formulated together and I agree with the documentation in the resident's note.  Chronic HFpEF looks well compensated today, she looks euvolemic, weight is still below discharge dry weight. We will continue current dose of diuretic, need to avoid hypovolemia given CKD. Supplemental oxygen requirement remains high risk at home. She has support from family, she does not want to move to a monitored environment, she is fiercely independent. She will have consultation with pulmonology soon to see if there is anything more we can do to improve her chronic hypoxia.   Erlinda Hong, MD FACP

## 2023-09-09 ENCOUNTER — Ambulatory Visit: Payer: Self-pay

## 2023-09-09 NOTE — Patient Instructions (Signed)
Visit Information  Thank you for taking time to visit with me today. Please don't hesitate to contact me if I can be of assistance to you.   Following are the goals we discussed today:   Goals Addressed             This Visit's Progress    I want to learn about my CHF, COPD and keep it under control       Patient Goals/Self Care Activities: -Patient/Caregiver will self-administer medications as prescribed as evidenced by self-report/primary caregiver report  -Patient/Caregiver will attend all scheduled provider appointments as evidenced by clinician review of documented attendance to scheduled appointments and patient/caregiver report -Patient/Caregiver will call pharmacy for medication refills as evidenced by patient report and review of pharmacy fill history as appropriate -Patient/Caregiver will call provider office for new concerns or questions as evidenced by review of documented incoming telephone call notes and patient report -Patient/Caregiver verbalizes understanding of plan -Patient/Caregiver will focus on medication adherence by taking medications as prescribed  -Weigh daily and record (notify MD with 3 lb weight gain over night or 5 lb in a week) -Follow CHF Action Plan -Adhere to low sodium diet  -Continue to be careful in the sun stay hydrated Provided instruction about proper use of medications used for management of COPD including inhalers Advised patient to self assesses COPD action plan zone and make appointment with provider if in the yellow zone for 48 hours without improvement Discussed the importance of adequate rest and management of fatigue with COPD Continue to evaluate your condition If it becomes worse before your appointment call 911 Take extra tank to appointments to cover you         Our next appointment is by telephone on 10/15/23 at 11 am  Please call the care guide team at [phone] if you need to cancel or reschedule your appointment.   If  you are experiencing a Mental Health or Behavioral Health Crisis or need someone to talk to, please call 1-800-273-TALK (toll free, 24 hour hotline)  Patient verbalizes understanding of instructions and care plan provided today and agrees to view in MyChart. Active MyChart status and patient understanding of how to access instructions and care plan via MyChart confirmed with patient.     [name] RN, BSN, [facility] [employer] Phone: [phone]

## 2023-09-09 NOTE — Patient Outreach (Signed)
Care Coordination   Follow Up Visit Note   09/09/2023 Name: Alicia [name] MRN: [id number] DOB: Jun 18, 1956  Alicia [name] is a 67 y.o. year old female who sees Oswaldo Done, Marquita Palms, MD for primary care. I spoke with  Henreitta Haydel Blumstein by phone today.  What matters to the patients health and wellness today?  In conversation with Alicia [name] today, I found that she had an appointment on October 11th with [name], and when she got out of the hospital, she weighed 200 lbs. She had increased to 214 lbs once she had stopped taking the correct dose of her Torsemide. She was supposed to take it three times a day and had only been taking it twice. Also, her blood sugars have been elevated. Her blood pressure was elevated at the office visit. It was 181 / 73, and her A1C is 8.1. So now she has started taking her torsomide as prescribed, and it is relieving her of the fluid, which is good. They now have her checking her blood pressure, weight, and blood sugar daily. Today, her blood pressure was 167 / 79, pulse was 66, weight was 209, and blood sugar was 115. [name] also stated that she was running out of air early when going to appointments and wanted to know if anything could be done. I called [employer] and spoke with [name], and she said that Alicia [name] has the largest tanks they have. She is on such a high liter that they run out early. She would need to take an extra tank, but a conservation device could be added. She will send the doctor an order to see if he would like her to be evaluated for the piece to be added to her tank. The patient was also notified.      Goals Addressed             This Visit's Progress    I want to learn about my CHF, COPD and keep it under control       Patient Goals/Self Care Activities: -Patient/Caregiver will self-administer medications as prescribed as evidenced by self-report/primary caregiver report  -Patient/Caregiver will attend all scheduled  provider appointments as evidenced by clinician review of documented attendance to scheduled appointments and patient/caregiver report -Patient/Caregiver will call pharmacy for medication refills as evidenced by patient report and review of pharmacy fill history as appropriate -Patient/Caregiver will call provider office for new concerns or questions as evidenced by review of documented incoming telephone call notes and patient report -Patient/Caregiver verbalizes understanding of plan -Patient/Caregiver will focus on medication adherence by taking medications as prescribed  -Weigh daily and record (notify MD with 3 lb weight gain over night or 5 lb in a week) -Follow CHF Action Plan -Adhere to low sodium diet  -Continue to be careful in the sun stay hydrated Provided instruction about proper use of medications used for management of COPD including inhalers Advised patient to self assesses COPD action plan zone and make appointment with provider if in the yellow zone for 48 hours without improvement Discussed the importance of adequate rest and management of fatigue with COPD Continue to evaluate your condition If it becomes worse before your appointment call 911 Take extra tank to appointments to cover you         SDOH assessments and interventions completed:  No     Care Coordination Interventions:  Yes, provided   Interventions Today    Flowsheet Row Most Recent Value  Chronic Disease  Chronic disease during today's visit Chronic Obstructive Pulmonary Disease (COPD), Congestive Heart Failure (CHF)  General Interventions   General Interventions Discussed/Reviewed General Interventions Discussed, General Interventions Reviewed, Communication with, Durable Medical Equipment (DME)  Durable Medical Equipment (DME) Other, Oxygen  [[facility] take extra tank not to run out of air at appointments]  Education Interventions   Education Provided Provided Education  Pharmacy Interventions    Pharmacy Dicussed/Reviewed Pharmacy Topics Discussed  Safety Interventions   Safety Discussed/Reviewed Safety Discussed        Follow up plan: Follow up call scheduled for 10/15/23  11 am    Encounter Outcome:  Patient Visit Completed   [name] RN, BSN, [facility]   Care Coordinator Phone: [phone]

## 2023-09-15 ENCOUNTER — Telehealth: Payer: Self-pay | Admitting: Student in an Organized Health Care Education/Training Program

## 2023-09-15 DIAGNOSIS — E113399 Type 2 diabetes mellitus with moderate nonproliferative diabetic retinopathy without macular edema, unspecified eye: Secondary | ICD-10-CM | POA: Diagnosis not present

## 2023-09-15 DIAGNOSIS — I2510 Atherosclerotic heart disease of native coronary artery without angina pectoris: Secondary | ICD-10-CM | POA: Diagnosis not present

## 2023-09-15 DIAGNOSIS — I495 Sick sinus syndrome: Secondary | ICD-10-CM | POA: Diagnosis not present

## 2023-09-15 DIAGNOSIS — E1122 Type 2 diabetes mellitus with diabetic chronic kidney disease: Secondary | ICD-10-CM | POA: Diagnosis not present

## 2023-09-15 DIAGNOSIS — I081 Rheumatic disorders of both mitral and tricuspid valves: Secondary | ICD-10-CM | POA: Diagnosis not present

## 2023-09-15 DIAGNOSIS — I5023 Acute on chronic systolic (congestive) heart failure: Secondary | ICD-10-CM | POA: Diagnosis not present

## 2023-09-15 DIAGNOSIS — I700 Atherosclerosis of aorta: Secondary | ICD-10-CM | POA: Diagnosis not present

## 2023-09-15 DIAGNOSIS — E559 Vitamin D deficiency, unspecified: Secondary | ICD-10-CM | POA: Diagnosis not present

## 2023-09-15 DIAGNOSIS — I252 Old myocardial infarction: Secondary | ICD-10-CM | POA: Diagnosis not present

## 2023-09-15 DIAGNOSIS — I272 Pulmonary hypertension, unspecified: Secondary | ICD-10-CM | POA: Diagnosis not present

## 2023-09-15 DIAGNOSIS — Z794 Long term (current) use of insulin: Secondary | ICD-10-CM | POA: Diagnosis not present

## 2023-09-15 DIAGNOSIS — D631 Anemia in chronic kidney disease: Secondary | ICD-10-CM | POA: Diagnosis not present

## 2023-09-15 DIAGNOSIS — Z7901 Long term (current) use of anticoagulants: Secondary | ICD-10-CM | POA: Diagnosis not present

## 2023-09-15 DIAGNOSIS — N1831 Chronic kidney disease, stage 3a: Secondary | ICD-10-CM | POA: Diagnosis not present

## 2023-09-15 DIAGNOSIS — I5032 Chronic diastolic (congestive) heart failure: Secondary | ICD-10-CM | POA: Diagnosis not present

## 2023-09-15 DIAGNOSIS — E7800 Pure hypercholesterolemia, unspecified: Secondary | ICD-10-CM | POA: Diagnosis not present

## 2023-09-15 DIAGNOSIS — E874 Mixed disorder of acid-base balance: Secondary | ICD-10-CM | POA: Diagnosis not present

## 2023-09-15 DIAGNOSIS — J449 Chronic obstructive pulmonary disease, unspecified: Secondary | ICD-10-CM | POA: Diagnosis not present

## 2023-09-15 DIAGNOSIS — J9611 Chronic respiratory failure with hypoxia: Secondary | ICD-10-CM | POA: Diagnosis not present

## 2023-09-15 DIAGNOSIS — I3139 Other pericardial effusion (noninflammatory): Secondary | ICD-10-CM | POA: Diagnosis not present

## 2023-09-15 DIAGNOSIS — I130 Hypertensive heart and chronic kidney disease with heart failure and stage 1 through stage 4 chronic kidney disease, or unspecified chronic kidney disease: Secondary | ICD-10-CM | POA: Diagnosis not present

## 2023-09-15 DIAGNOSIS — I480 Paroxysmal atrial fibrillation: Secondary | ICD-10-CM | POA: Diagnosis not present

## 2023-09-15 NOTE — Telephone Encounter (Signed)
Rec'd a call from [facility] [phone] requesting a Oxygen mask from [facility].

## 2023-09-15 NOTE — Telephone Encounter (Signed)
I don't want to order a face mask to be used at home. That would be difficult for the patient to tolerate. Her oxygen requirement is making improvements, she can briefly increase the oxygen flow rate to the nasal canula if needed after walking or with a lot of talking. I don't think a face mask would be a good in this person's case. I can talk about it more with the patient at our next visit.

## 2023-09-15 NOTE — Telephone Encounter (Signed)
RTC to LeLe RN with [facility] given message that Dr. Oswaldo Done did not feel that patient needed a face mask.  Her Oxygen requirements are better.  Will need to increase Oxygen when she gets up and when talking a lot.  LeLe to talk with patient about .

## 2023-09-15 NOTE — Telephone Encounter (Signed)
Spoke with LeLe, RN from [facility] states patient is on 2-3 liters of Oxygen per nasal Cannula.  Patient's oxygen levels drop to the 80's when she talks as she is a mouth breather.  Suggestion to order an oxygen Mask for patient thru [facility].  Will need to be a DME order per [place] .

## 2023-09-19 DIAGNOSIS — J449 Chronic obstructive pulmonary disease, unspecified: Secondary | ICD-10-CM | POA: Diagnosis not present

## 2023-09-19 DIAGNOSIS — J9611 Chronic respiratory failure with hypoxia: Secondary | ICD-10-CM | POA: Diagnosis not present

## 2023-09-20 DIAGNOSIS — I495 Sick sinus syndrome: Secondary | ICD-10-CM | POA: Diagnosis not present

## 2023-09-20 DIAGNOSIS — J9611 Chronic respiratory failure with hypoxia: Secondary | ICD-10-CM | POA: Diagnosis not present

## 2023-09-20 DIAGNOSIS — I252 Old myocardial infarction: Secondary | ICD-10-CM | POA: Diagnosis not present

## 2023-09-20 DIAGNOSIS — E874 Mixed disorder of acid-base balance: Secondary | ICD-10-CM | POA: Diagnosis not present

## 2023-09-20 DIAGNOSIS — Z7901 Long term (current) use of anticoagulants: Secondary | ICD-10-CM | POA: Diagnosis not present

## 2023-09-20 DIAGNOSIS — I480 Paroxysmal atrial fibrillation: Secondary | ICD-10-CM | POA: Diagnosis not present

## 2023-09-20 DIAGNOSIS — N1831 Chronic kidney disease, stage 3a: Secondary | ICD-10-CM | POA: Diagnosis not present

## 2023-09-20 DIAGNOSIS — D631 Anemia in chronic kidney disease: Secondary | ICD-10-CM | POA: Diagnosis not present

## 2023-09-20 DIAGNOSIS — I2510 Atherosclerotic heart disease of native coronary artery without angina pectoris: Secondary | ICD-10-CM | POA: Diagnosis not present

## 2023-09-20 DIAGNOSIS — E113399 Type 2 diabetes mellitus with moderate nonproliferative diabetic retinopathy without macular edema, unspecified eye: Secondary | ICD-10-CM | POA: Diagnosis not present

## 2023-09-20 DIAGNOSIS — I5023 Acute on chronic systolic (congestive) heart failure: Secondary | ICD-10-CM | POA: Diagnosis not present

## 2023-09-20 DIAGNOSIS — I5032 Chronic diastolic (congestive) heart failure: Secondary | ICD-10-CM | POA: Diagnosis not present

## 2023-09-20 DIAGNOSIS — J449 Chronic obstructive pulmonary disease, unspecified: Secondary | ICD-10-CM | POA: Diagnosis not present

## 2023-09-20 DIAGNOSIS — Z794 Long term (current) use of insulin: Secondary | ICD-10-CM | POA: Diagnosis not present

## 2023-09-20 DIAGNOSIS — E7800 Pure hypercholesterolemia, unspecified: Secondary | ICD-10-CM | POA: Diagnosis not present

## 2023-09-20 DIAGNOSIS — I3139 Other pericardial effusion (noninflammatory): Secondary | ICD-10-CM | POA: Diagnosis not present

## 2023-09-20 DIAGNOSIS — E559 Vitamin D deficiency, unspecified: Secondary | ICD-10-CM | POA: Diagnosis not present

## 2023-09-20 DIAGNOSIS — I700 Atherosclerosis of aorta: Secondary | ICD-10-CM | POA: Diagnosis not present

## 2023-09-20 DIAGNOSIS — I272 Pulmonary hypertension, unspecified: Secondary | ICD-10-CM | POA: Diagnosis not present

## 2023-09-20 DIAGNOSIS — E1122 Type 2 diabetes mellitus with diabetic chronic kidney disease: Secondary | ICD-10-CM | POA: Diagnosis not present

## 2023-09-20 DIAGNOSIS — I081 Rheumatic disorders of both mitral and tricuspid valves: Secondary | ICD-10-CM | POA: Diagnosis not present

## 2023-09-20 DIAGNOSIS — I130 Hypertensive heart and chronic kidney disease with heart failure and stage 1 through stage 4 chronic kidney disease, or unspecified chronic kidney disease: Secondary | ICD-10-CM | POA: Diagnosis not present

## 2023-09-21 DIAGNOSIS — N1831 Chronic kidney disease, stage 3a: Secondary | ICD-10-CM | POA: Diagnosis not present

## 2023-09-21 DIAGNOSIS — I5032 Chronic diastolic (congestive) heart failure: Secondary | ICD-10-CM | POA: Diagnosis not present

## 2023-09-21 DIAGNOSIS — I2510 Atherosclerotic heart disease of native coronary artery without angina pectoris: Secondary | ICD-10-CM | POA: Diagnosis not present

## 2023-09-21 DIAGNOSIS — I495 Sick sinus syndrome: Secondary | ICD-10-CM | POA: Diagnosis not present

## 2023-09-21 DIAGNOSIS — Z794 Long term (current) use of insulin: Secondary | ICD-10-CM | POA: Diagnosis not present

## 2023-09-21 DIAGNOSIS — E7800 Pure hypercholesterolemia, unspecified: Secondary | ICD-10-CM | POA: Diagnosis not present

## 2023-09-21 DIAGNOSIS — I252 Old myocardial infarction: Secondary | ICD-10-CM | POA: Diagnosis not present

## 2023-09-21 DIAGNOSIS — E559 Vitamin D deficiency, unspecified: Secondary | ICD-10-CM | POA: Diagnosis not present

## 2023-09-21 DIAGNOSIS — J449 Chronic obstructive pulmonary disease, unspecified: Secondary | ICD-10-CM | POA: Diagnosis not present

## 2023-09-21 DIAGNOSIS — E113399 Type 2 diabetes mellitus with moderate nonproliferative diabetic retinopathy without macular edema, unspecified eye: Secondary | ICD-10-CM | POA: Diagnosis not present

## 2023-09-21 DIAGNOSIS — I700 Atherosclerosis of aorta: Secondary | ICD-10-CM | POA: Diagnosis not present

## 2023-09-21 DIAGNOSIS — I480 Paroxysmal atrial fibrillation: Secondary | ICD-10-CM | POA: Diagnosis not present

## 2023-09-21 DIAGNOSIS — J9611 Chronic respiratory failure with hypoxia: Secondary | ICD-10-CM | POA: Diagnosis not present

## 2023-09-21 DIAGNOSIS — I272 Pulmonary hypertension, unspecified: Secondary | ICD-10-CM | POA: Diagnosis not present

## 2023-09-21 DIAGNOSIS — I081 Rheumatic disorders of both mitral and tricuspid valves: Secondary | ICD-10-CM | POA: Diagnosis not present

## 2023-09-21 DIAGNOSIS — I5023 Acute on chronic systolic (congestive) heart failure: Secondary | ICD-10-CM | POA: Diagnosis not present

## 2023-09-21 DIAGNOSIS — Z7901 Long term (current) use of anticoagulants: Secondary | ICD-10-CM | POA: Diagnosis not present

## 2023-09-21 DIAGNOSIS — I130 Hypertensive heart and chronic kidney disease with heart failure and stage 1 through stage 4 chronic kidney disease, or unspecified chronic kidney disease: Secondary | ICD-10-CM | POA: Diagnosis not present

## 2023-09-21 DIAGNOSIS — E874 Mixed disorder of acid-base balance: Secondary | ICD-10-CM | POA: Diagnosis not present

## 2023-09-21 DIAGNOSIS — I3139 Other pericardial effusion (noninflammatory): Secondary | ICD-10-CM | POA: Diagnosis not present

## 2023-09-21 DIAGNOSIS — E1122 Type 2 diabetes mellitus with diabetic chronic kidney disease: Secondary | ICD-10-CM | POA: Diagnosis not present

## 2023-09-21 DIAGNOSIS — D631 Anemia in chronic kidney disease: Secondary | ICD-10-CM | POA: Diagnosis not present

## 2023-09-28 DIAGNOSIS — I252 Old myocardial infarction: Secondary | ICD-10-CM | POA: Diagnosis not present

## 2023-09-28 DIAGNOSIS — I272 Pulmonary hypertension, unspecified: Secondary | ICD-10-CM | POA: Diagnosis not present

## 2023-09-28 DIAGNOSIS — I700 Atherosclerosis of aorta: Secondary | ICD-10-CM | POA: Diagnosis not present

## 2023-09-28 DIAGNOSIS — N1831 Chronic kidney disease, stage 3a: Secondary | ICD-10-CM | POA: Diagnosis not present

## 2023-09-28 DIAGNOSIS — E559 Vitamin D deficiency, unspecified: Secondary | ICD-10-CM | POA: Diagnosis not present

## 2023-09-28 DIAGNOSIS — D631 Anemia in chronic kidney disease: Secondary | ICD-10-CM | POA: Diagnosis not present

## 2023-09-28 DIAGNOSIS — I480 Paroxysmal atrial fibrillation: Secondary | ICD-10-CM | POA: Diagnosis not present

## 2023-09-28 DIAGNOSIS — J449 Chronic obstructive pulmonary disease, unspecified: Secondary | ICD-10-CM | POA: Diagnosis not present

## 2023-09-28 DIAGNOSIS — J9611 Chronic respiratory failure with hypoxia: Secondary | ICD-10-CM | POA: Diagnosis not present

## 2023-09-28 DIAGNOSIS — E874 Mixed disorder of acid-base balance: Secondary | ICD-10-CM | POA: Diagnosis not present

## 2023-09-28 DIAGNOSIS — I5023 Acute on chronic systolic (congestive) heart failure: Secondary | ICD-10-CM | POA: Diagnosis not present

## 2023-09-28 DIAGNOSIS — I3139 Other pericardial effusion (noninflammatory): Secondary | ICD-10-CM | POA: Diagnosis not present

## 2023-09-28 DIAGNOSIS — I5032 Chronic diastolic (congestive) heart failure: Secondary | ICD-10-CM | POA: Diagnosis not present

## 2023-09-28 DIAGNOSIS — I081 Rheumatic disorders of both mitral and tricuspid valves: Secondary | ICD-10-CM | POA: Diagnosis not present

## 2023-09-28 DIAGNOSIS — I495 Sick sinus syndrome: Secondary | ICD-10-CM | POA: Diagnosis not present

## 2023-09-28 DIAGNOSIS — Z794 Long term (current) use of insulin: Secondary | ICD-10-CM | POA: Diagnosis not present

## 2023-09-28 DIAGNOSIS — E113399 Type 2 diabetes mellitus with moderate nonproliferative diabetic retinopathy without macular edema, unspecified eye: Secondary | ICD-10-CM | POA: Diagnosis not present

## 2023-09-28 DIAGNOSIS — E1122 Type 2 diabetes mellitus with diabetic chronic kidney disease: Secondary | ICD-10-CM | POA: Diagnosis not present

## 2023-09-28 DIAGNOSIS — I2510 Atherosclerotic heart disease of native coronary artery without angina pectoris: Secondary | ICD-10-CM | POA: Diagnosis not present

## 2023-09-28 DIAGNOSIS — E7800 Pure hypercholesterolemia, unspecified: Secondary | ICD-10-CM | POA: Diagnosis not present

## 2023-09-28 DIAGNOSIS — Z7901 Long term (current) use of anticoagulants: Secondary | ICD-10-CM | POA: Diagnosis not present

## 2023-09-28 DIAGNOSIS — I130 Hypertensive heart and chronic kidney disease with heart failure and stage 1 through stage 4 chronic kidney disease, or unspecified chronic kidney disease: Secondary | ICD-10-CM | POA: Diagnosis not present

## 2023-09-30 ENCOUNTER — Ambulatory Visit: Payer: 59

## 2023-09-30 DIAGNOSIS — E559 Vitamin D deficiency, unspecified: Secondary | ICD-10-CM | POA: Diagnosis not present

## 2023-09-30 DIAGNOSIS — Z794 Long term (current) use of insulin: Secondary | ICD-10-CM | POA: Diagnosis not present

## 2023-09-30 DIAGNOSIS — I700 Atherosclerosis of aorta: Secondary | ICD-10-CM | POA: Diagnosis not present

## 2023-09-30 DIAGNOSIS — E1122 Type 2 diabetes mellitus with diabetic chronic kidney disease: Secondary | ICD-10-CM | POA: Diagnosis not present

## 2023-09-30 DIAGNOSIS — I3139 Other pericardial effusion (noninflammatory): Secondary | ICD-10-CM | POA: Diagnosis not present

## 2023-09-30 DIAGNOSIS — N1831 Chronic kidney disease, stage 3a: Secondary | ICD-10-CM | POA: Diagnosis not present

## 2023-09-30 DIAGNOSIS — Z7901 Long term (current) use of anticoagulants: Secondary | ICD-10-CM | POA: Diagnosis not present

## 2023-09-30 DIAGNOSIS — J9611 Chronic respiratory failure with hypoxia: Secondary | ICD-10-CM | POA: Diagnosis not present

## 2023-09-30 DIAGNOSIS — I495 Sick sinus syndrome: Secondary | ICD-10-CM | POA: Diagnosis not present

## 2023-09-30 DIAGNOSIS — I252 Old myocardial infarction: Secondary | ICD-10-CM | POA: Diagnosis not present

## 2023-09-30 DIAGNOSIS — E113399 Type 2 diabetes mellitus with moderate nonproliferative diabetic retinopathy without macular edema, unspecified eye: Secondary | ICD-10-CM | POA: Diagnosis not present

## 2023-09-30 DIAGNOSIS — I2510 Atherosclerotic heart disease of native coronary artery without angina pectoris: Secondary | ICD-10-CM | POA: Diagnosis not present

## 2023-09-30 DIAGNOSIS — I130 Hypertensive heart and chronic kidney disease with heart failure and stage 1 through stage 4 chronic kidney disease, or unspecified chronic kidney disease: Secondary | ICD-10-CM | POA: Diagnosis not present

## 2023-09-30 DIAGNOSIS — I5023 Acute on chronic systolic (congestive) heart failure: Secondary | ICD-10-CM | POA: Diagnosis not present

## 2023-09-30 DIAGNOSIS — E874 Mixed disorder of acid-base balance: Secondary | ICD-10-CM | POA: Diagnosis not present

## 2023-09-30 DIAGNOSIS — E7800 Pure hypercholesterolemia, unspecified: Secondary | ICD-10-CM | POA: Diagnosis not present

## 2023-09-30 DIAGNOSIS — I272 Pulmonary hypertension, unspecified: Secondary | ICD-10-CM | POA: Diagnosis not present

## 2023-09-30 DIAGNOSIS — D631 Anemia in chronic kidney disease: Secondary | ICD-10-CM | POA: Diagnosis not present

## 2023-09-30 DIAGNOSIS — I480 Paroxysmal atrial fibrillation: Secondary | ICD-10-CM | POA: Diagnosis not present

## 2023-09-30 DIAGNOSIS — I081 Rheumatic disorders of both mitral and tricuspid valves: Secondary | ICD-10-CM | POA: Diagnosis not present

## 2023-09-30 DIAGNOSIS — I5032 Chronic diastolic (congestive) heart failure: Secondary | ICD-10-CM | POA: Diagnosis not present

## 2023-09-30 DIAGNOSIS — J449 Chronic obstructive pulmonary disease, unspecified: Secondary | ICD-10-CM | POA: Diagnosis not present

## 2023-10-01 ENCOUNTER — Other Ambulatory Visit: Payer: Self-pay | Admitting: Nurse Practitioner

## 2023-10-04 ENCOUNTER — Encounter: Payer: Self-pay | Admitting: Student in an Organized Health Care Education/Training Program

## 2023-10-04 ENCOUNTER — Other Ambulatory Visit: Payer: Self-pay | Admitting: Student in an Organized Health Care Education/Training Program

## 2023-10-04 ENCOUNTER — Ambulatory Visit (INDEPENDENT_AMBULATORY_CARE_PROVIDER_SITE_OTHER): Payer: 59 | Admitting: Student in an Organized Health Care Education/Training Program

## 2023-10-04 VITALS — BP 168/78 | HR 72 | Temp 98.0°F | Ht 67.0 in | Wt 216.4 lb

## 2023-10-04 DIAGNOSIS — K219 Gastro-esophageal reflux disease without esophagitis: Secondary | ICD-10-CM | POA: Diagnosis not present

## 2023-10-04 DIAGNOSIS — J449 Chronic obstructive pulmonary disease, unspecified: Secondary | ICD-10-CM

## 2023-10-04 DIAGNOSIS — I5032 Chronic diastolic (congestive) heart failure: Secondary | ICD-10-CM

## 2023-10-04 DIAGNOSIS — Z87891 Personal history of nicotine dependence: Secondary | ICD-10-CM | POA: Diagnosis not present

## 2023-10-04 DIAGNOSIS — E119 Type 2 diabetes mellitus without complications: Secondary | ICD-10-CM | POA: Diagnosis not present

## 2023-10-04 DIAGNOSIS — Z794 Long term (current) use of insulin: Secondary | ICD-10-CM | POA: Diagnosis not present

## 2023-10-04 MED ORDER — TORSEMIDE 20 MG PO TABS
40.0000 mg | ORAL_TABLET | Freq: Two times a day (BID) | ORAL | 3 refills | Status: DC
Start: 2023-10-04 — End: 2023-10-07

## 2023-10-04 MED ORDER — SACUBITRIL-VALSARTAN 49-51 MG PO TABS
1.0000 | ORAL_TABLET | Freq: Two times a day (BID) | ORAL | 2 refills | Status: DC
Start: 2023-10-04 — End: 2023-10-16

## 2023-10-04 NOTE — Progress Notes (Signed)
Attestation for Student Documentation:  I personally was present and re-performed the history, physical exam and medical decision-making activities of this service and have verified that the service and findings are accurately documented in the student's note.  67 year old person who we have been managing carefully with severe chronic respiratory failure on high supplemental oxygen dose of 6-8 L/min at home, heart failure with recovered EF, COPD with emphysema.  She continues to have a very poor quality of life, difficult for her to ambulate anywhere outside of the house, mostly stuck at home and gets significantly dyspneic with even minimal exertion.  We have not made much progress since hospitalization.  She looks a little more hypervolemic today with some lower extremity edema, and weight is up.  She has distant breath sounds, very low lung volumes, some wheezing at the upper lobes, no crackles.  Good adherence with Trelegy inhaler.  Will increase diuresis, increase torsemide to 40 mg twice daily.  Follow-up with me in 1 month for weight recheck, volume assessment, and the labs.  Try to clarify confusion around Entresto dosing, she has a 200 mg bottle that she has been using which is a old prescription.  I resent a 100 mg dose to use twice daily as she has had some lightheadedness with standing and we are increasing diuresis, has had acute kidney injury in the past during diuresis.  She has consultation with pulmonology on 12/5, we will see if there is anything further we can do to try to improve her oxygenation and quality of life.  [name], MD 10/04/2023, 11:12 AM

## 2023-10-04 NOTE — Progress Notes (Signed)
Subjective:   Patient ID: Alicia [name] female   DOB: 1956-08-06 67 y.o.   MRN: [id number]  HPI: [name] is a 67 y.o. female presenting for HFpEF and COPD management.   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  Non-functioning   Aortic atherosclerosis (HCC) 09/24/2017   Asymptomatic, found on CT scan   Blood transfusion without reported diagnosis    Chronic Systolic Heart Failure 05/10/2014   Felt to be non-ischemic and secondary to hypertension.  Echo (05/28/2014): LVEF 25%.  Is not interested in AICD placement.   COPD exacerbation (HCC)    Coronary artery disease involving native coronary artery of native heart without angina pectoris 11/09/2014   Cardiac cath (02/18/2014): Non-obstructive, mRCA 30%, dRCA 60%   Cystocele with uterine prolapse - grade 3 02/12/2016   Not interested in pessary after trying   Diverticulosis of colon 09/24/2017   Diverticulosis of colon 09/24/2017   Essential hypertension 10/15/2013   Gastroesophageal reflux disease 09/24/2017   History of cerebrovascular accident 05/10/2013   Per patient report 6 previous strokes, most recent on 05/10/13.  No residual deficits.   History of cerebrovascular accident 05/10/2013   Per patient report 6 previous strokes, most recent on 05/10/13.  No residual deficits.   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 (HCC) 10/15/2013   Current Outpatient Medications  Medication Sig Dispense  Refill   amLODipine (NORVASC) 10 MG tablet Take 10 mg by mouth daily.     albuterol (PROVENTIL) (2.5 MG/3ML) 0.083% nebulizer solution INHALE 3 ML BY NEBULIZATION EVERY 6 HOURS AS NEEDED FOR WHEEZING OR SHORTNESS OF BREATH 75 mL 12   albuterol (VENTOLIN HFA) 108 (90 Base) MCG/ACT inhaler Inhale 2 puffs into the lungs every 6 (six) hours as needed for wheezing or shortness of breath. 8 g 2   apixaban (ELIQUIS) 5 MG TABS tablet Take 1 tablet (5 mg total) by mouth 2 (two) times daily. 60 tablet 11   atorvastatin (LIPITOR) 80 MG tablet TAKE 1 TABLET BY MOUTH EVERY DAY 90 tablet 3   Dulaglutide (TRULICITY) 1.5 MG/0.5ML SOPN Inject 1.5 mg into the skin once a week. (Patient not taking: Reported on 08/08/2023) 2 mL 5   ezetimibe (ZETIA) 10 MG tablet TAKE 1 TABLET BY MOUTH EVERY DAY 90 tablet 3   Fluticasone-Umeclidin-Vilant (TRELEGY ELLIPTA) 200-62.5-25 MCG/ACT AEPB Inhale 1 puff into the lungs daily. 60 each 5   glucose blood (ACCU-CHEK AVIVA PLUS) test strip 1 each by Other route See admin instructions. USE TO TEST BLOOD SUGARS AS DIRECTED 100 strip 3   insulin glargine (LANTUS) 100 UNIT/ML Solostar Pen Inject 20 Units into the skin daily. 15 mL 3   insulin lispro (HUMALOG) 100 UNIT/ML KwikPen Inject 14 Units into the skin 3 (three) times daily before meals. Only take if eating a meal AND Blood Glucose (BG) is 80 or higher. 15 mL 3   Insulin Syringe-Needle U-100 31G X 15/64" 0.3 ML  MISC Use to inject Humalog before meals three times a day 100 each 5   Lancets (ACCU-CHEK MULTICLIX) lancets Use to check your blood sugar four times daily: early morning, before a meal, two hours after a meal, and bedtime 100 each 12   sacubitril-valsartan (ENTRESTO) 49-51 MG Take 1 tablet by mouth 2 (two) times daily. 60 tablet 2   spironolactone (ALDACTONE) 25 MG tablet Take 1 tablet (25 mg total) by mouth daily. 30 tablet 2   torsemide (DEMADEX) 20 MG tablet Take 2 tablets (40 mg total) by mouth 2 (two) times daily. 40 in AM  and 20 in PM 180 tablet 3   No current facility-administered medications for this visit.   Family History  Problem Relation Age of Onset   Diabetes Mellitus II Mother    Hypertension Mother    Cerebrovascular Accident Mother 67       Massive, resulted in death   Pulmonary embolism Father 68       Resulted in sudden death   Heart failure Brother    Obesity Brother    Coronary artery disease Brother    Obesity Brother    COPD Brother    Diabetes Mellitus II Brother    Healthy Brother    Healthy Brother    Healthy Daughter    Healthy Son    Healthy Son    Social History   Socioeconomic History   Marital status: Widowed    Spouse name: Not on file   Number of children: 3   Years of education: Not on file   Highest education level: High school graduate  Occupational History   Occupation: Disabled    Comment: Formerly worked in Dentist  Tobacco Use   Smoking status: Former    Current packs/day: 0.00    Average packs/day: 1 pack/day for 40.0 years (40.0 ttl pk-yrs)    Types: Cigarettes    Start date: 09/17/1979    Quit date: 09/17/2019    Years since quitting: 4.0   Smokeless tobacco: Never  Vaping Use   Vaping status: Never Used  Substance and Sexual Activity   Alcohol use: No    Alcohol/week: 0.0 standard drinks of alcohol   Drug use: No   Sexual activity: Not Currently    Birth control/protection: Post-menopausal  Other Topics Concern   Not on file  Social History Narrative   Current Social History 02/25/2021        Patient lives with family in a home which is 2 stories. There are steps up to the entrance the patient uses with handrails.       Patient's method of transportation is personal car.      The highest level of education was some high school.      The patient currently disabled.      Identified important Relationships are "Family, my daughter and grandkids"       Pets : My dog       Interests / Fun: "sleeping, working in my flowerbed         Current Stressors: The war in [place]       Social Determinants of Health   Financial Resource Strain: Low Risk  (08/09/2023)   Overall Financial Resource Strain (CARDIA)    Difficulty of Paying Living Expenses: Not hard at all  Food Insecurity: No Food Insecurity (08/06/2023)   Hunger Vital Sign    Worried About Running Out of Food in the Last Year: Never true    Ran  Out of Food in the Last Year: Never true  Transportation Needs: No Transportation Needs (08/07/2023)   PRAPARE - Administrator, Civil Service (Medical): No    Lack of Transportation (Non-Medical): No  Physical Activity: Inactive (05/19/2023)   Exercise Vital Sign    Days of Exercise per Week: 0 days    Minutes of Exercise per Session: 0 min  Stress: No Stress Concern Present (05/19/2023)   [employer] of Occupational Health - Occupational Stress Questionnaire    Feeling of Stress : Not at all  Social Connections: Socially Isolated (05/19/2023)   Social Connection and Isolation Panel [NHANES]    Frequency of Communication with Friends and Family: More than three times a week    Frequency of Social Gatherings with Friends and Family: Never    Attends Religious Services: Never    Database administrator or Organizations: No    Attends Banker Meetings: Never    Marital Status: Widowed   Review of Systems: Pertinent items noted in HPI and remainder of comprehensive ROS otherwise negative.  Objective:  Physical Exam: Vitals:   10/04/23 0941  BP: (!) 146/63  Pulse: 76  Temp: 98 F (36.7 C)  TempSrc: Oral  SpO2: 93%  Weight: 216 lb 6.4 oz (98.2 kg)  Height: 5\' 7"  (1.702 m)   BP (!) 146/63 (BP Location: Right Arm, Patient Position: Sitting, Cuff Size: Small)   Pulse 76   Temp 98 F (36.7 C) (Oral)   Ht 5\' 7"  (1.702 m)   Wt 216 lb 6.4 oz (98.2 kg)   LMP  (LMP Unknown)   SpO2 93% Comment: 5 liters  BMI 33.89 kg/m   General appearance: alert and cooperative, nasal cannula in  place  Lungs:  Diminshed breath sounds in the lower lobes. Wheezing in the middle to upper lobes Extremities: 2+ edema on both legs, left greater than right  Assessment & Plan:  Heart failure with recovered ejection fraction (HFrecEF) (HCC)  Weight today was 216, which is up from her discharge weight of 210. Home weights have been in the 211-214 range. She has been taking torsemide 40 mg in the morning and 20 in the afternoon and making good urine on this dose. She has been having some difficulty breathing and has edema on exam, plus her weights have been increased compared to her discharge weight. Some confusion as to her Entresto dose, as she has fills of both the 49-51 and 97-103 dose, making effectiveness unclear. She reports one episode of dizziness when bending over.  - Increase torsemide to 40 mg BID - Restart Entresto 49-51 BID  COPD (chronic obstructive pulmonary disease) (HCC)  Was discharged on 8L oxygen and continues to require 7.5-8L oxygen at home. She reports that the Trelegy and albuterol are helpful for her. Although she says she is doing alright, she also says she is scared to leave the house alone because of concern she will run out of oxygen. She is also having difficulty performing her daily tasks in the house without feeling short of breath.  - Continue Trelegy - Continue albuterol as needed - Continue to monitor oxygen requirement - Pulm appt on 12/5 and 12/11  Type 2 diabetes mellitus  Previous A1c on 10/11 was 8.1. She reports that she sometimes has readings that are off the glucometer and that in general, her readings are higher than she would like. Continuing current regimen for now in favor of improving breathing so she can perform daily  activities.   Acid reflux Patient reports nausea and dry heaving every morning that was improved with a medication in the hospital. She says she has a few pills left. Suggested to let us know which medication that is so that she can  get relief.   Thea Alken, MS3

## 2023-10-04 NOTE — Patient Instructions (Signed)
Thank you, Ms. Kaytlinn Opsahl Tieu for allowing [name] to provide your care today.   We'd like to make the following medication changes:   Entresto -> 49-51mg  two times per day. This is the lower dose, so you will need to pick up the new prescription.   Torsemide -> 40 mg in the morning, 40 mg after lunchtime. This is the diuretic, so taking this in the afternoon will help you not need to get up in the night to use the bathroom. If you are having more dizziness, please let us know.   Please also make sure to go to your appointment on 12/5 with the pulmonologists, your lung doctors. They will be able to help determine if there are changes that can improve your breathing so you can feel more comfortable getting back to doing things you want to do.   Also, let us know what that medication was that helped your nausea and dry heaving in the morning. We might be able to prescribe you something similar.    I have ordered the following medication/changed the following medications:   Stop the following medications: Medications Discontinued During This Encounter  Medication Reason   torsemide (DEMADEX) 20 MG tablet    sacubitril-valsartan (ENTRESTO) 49-51 MG Reorder   torsemide (DEMADEX) 20 MG tablet Reorder     Start the following medications: Meds ordered this encounter  Medications   torsemide (DEMADEX) 20 MG tablet    Sig: Take 2 tablets (40 mg total) by mouth 2 (two) times daily. 40 in AM and 20 in PM    Dispense:  180 tablet    Refill:  3   sacubitril-valsartan (ENTRESTO) 49-51 MG    Sig: Take 1 tablet by mouth 2 (two) times daily.    Dispense:  60 tablet    Refill:  2     Follow up:  4 weeks    Thank you for trusting me with your care. Wishing you the best!   Thea Alken, MS3

## 2023-10-06 DIAGNOSIS — E874 Mixed disorder of acid-base balance: Secondary | ICD-10-CM | POA: Diagnosis not present

## 2023-10-06 DIAGNOSIS — E7800 Pure hypercholesterolemia, unspecified: Secondary | ICD-10-CM | POA: Diagnosis not present

## 2023-10-06 DIAGNOSIS — E113399 Type 2 diabetes mellitus with moderate nonproliferative diabetic retinopathy without macular edema, unspecified eye: Secondary | ICD-10-CM | POA: Diagnosis not present

## 2023-10-06 DIAGNOSIS — J449 Chronic obstructive pulmonary disease, unspecified: Secondary | ICD-10-CM | POA: Diagnosis not present

## 2023-10-06 DIAGNOSIS — I272 Pulmonary hypertension, unspecified: Secondary | ICD-10-CM | POA: Diagnosis not present

## 2023-10-06 DIAGNOSIS — Z7901 Long term (current) use of anticoagulants: Secondary | ICD-10-CM | POA: Diagnosis not present

## 2023-10-06 DIAGNOSIS — E559 Vitamin D deficiency, unspecified: Secondary | ICD-10-CM | POA: Diagnosis not present

## 2023-10-06 DIAGNOSIS — I130 Hypertensive heart and chronic kidney disease with heart failure and stage 1 through stage 4 chronic kidney disease, or unspecified chronic kidney disease: Secondary | ICD-10-CM | POA: Diagnosis not present

## 2023-10-06 DIAGNOSIS — N1831 Chronic kidney disease, stage 3a: Secondary | ICD-10-CM | POA: Diagnosis not present

## 2023-10-06 DIAGNOSIS — I480 Paroxysmal atrial fibrillation: Secondary | ICD-10-CM | POA: Diagnosis not present

## 2023-10-06 DIAGNOSIS — I252 Old myocardial infarction: Secondary | ICD-10-CM | POA: Diagnosis not present

## 2023-10-06 DIAGNOSIS — I495 Sick sinus syndrome: Secondary | ICD-10-CM | POA: Diagnosis not present

## 2023-10-06 DIAGNOSIS — E1122 Type 2 diabetes mellitus with diabetic chronic kidney disease: Secondary | ICD-10-CM | POA: Diagnosis not present

## 2023-10-06 DIAGNOSIS — I700 Atherosclerosis of aorta: Secondary | ICD-10-CM | POA: Diagnosis not present

## 2023-10-06 DIAGNOSIS — Z794 Long term (current) use of insulin: Secondary | ICD-10-CM | POA: Diagnosis not present

## 2023-10-06 DIAGNOSIS — I3139 Other pericardial effusion (noninflammatory): Secondary | ICD-10-CM | POA: Diagnosis not present

## 2023-10-06 DIAGNOSIS — I5032 Chronic diastolic (congestive) heart failure: Secondary | ICD-10-CM | POA: Diagnosis not present

## 2023-10-06 DIAGNOSIS — I5023 Acute on chronic systolic (congestive) heart failure: Secondary | ICD-10-CM | POA: Diagnosis not present

## 2023-10-06 DIAGNOSIS — I081 Rheumatic disorders of both mitral and tricuspid valves: Secondary | ICD-10-CM | POA: Diagnosis not present

## 2023-10-06 DIAGNOSIS — J9611 Chronic respiratory failure with hypoxia: Secondary | ICD-10-CM | POA: Diagnosis not present

## 2023-10-06 DIAGNOSIS — D631 Anemia in chronic kidney disease: Secondary | ICD-10-CM | POA: Diagnosis not present

## 2023-10-06 DIAGNOSIS — I2510 Atherosclerotic heart disease of native coronary artery without angina pectoris: Secondary | ICD-10-CM | POA: Diagnosis not present

## 2023-10-07 DIAGNOSIS — I700 Atherosclerosis of aorta: Secondary | ICD-10-CM | POA: Diagnosis not present

## 2023-10-07 DIAGNOSIS — D631 Anemia in chronic kidney disease: Secondary | ICD-10-CM | POA: Diagnosis not present

## 2023-10-07 DIAGNOSIS — J9611 Chronic respiratory failure with hypoxia: Secondary | ICD-10-CM | POA: Diagnosis not present

## 2023-10-07 DIAGNOSIS — I5032 Chronic diastolic (congestive) heart failure: Secondary | ICD-10-CM | POA: Diagnosis not present

## 2023-10-07 DIAGNOSIS — E559 Vitamin D deficiency, unspecified: Secondary | ICD-10-CM | POA: Diagnosis not present

## 2023-10-07 DIAGNOSIS — N1831 Chronic kidney disease, stage 3a: Secondary | ICD-10-CM | POA: Diagnosis not present

## 2023-10-07 DIAGNOSIS — J449 Chronic obstructive pulmonary disease, unspecified: Secondary | ICD-10-CM | POA: Diagnosis not present

## 2023-10-07 DIAGNOSIS — E113399 Type 2 diabetes mellitus with moderate nonproliferative diabetic retinopathy without macular edema, unspecified eye: Secondary | ICD-10-CM | POA: Diagnosis not present

## 2023-10-07 DIAGNOSIS — E1122 Type 2 diabetes mellitus with diabetic chronic kidney disease: Secondary | ICD-10-CM | POA: Diagnosis not present

## 2023-10-07 DIAGNOSIS — E874 Mixed disorder of acid-base balance: Secondary | ICD-10-CM | POA: Diagnosis not present

## 2023-10-07 DIAGNOSIS — I252 Old myocardial infarction: Secondary | ICD-10-CM | POA: Diagnosis not present

## 2023-10-07 DIAGNOSIS — I495 Sick sinus syndrome: Secondary | ICD-10-CM | POA: Diagnosis not present

## 2023-10-07 DIAGNOSIS — E7800 Pure hypercholesterolemia, unspecified: Secondary | ICD-10-CM | POA: Diagnosis not present

## 2023-10-07 DIAGNOSIS — Z794 Long term (current) use of insulin: Secondary | ICD-10-CM | POA: Diagnosis not present

## 2023-10-07 DIAGNOSIS — I5023 Acute on chronic systolic (congestive) heart failure: Secondary | ICD-10-CM | POA: Diagnosis not present

## 2023-10-07 DIAGNOSIS — I130 Hypertensive heart and chronic kidney disease with heart failure and stage 1 through stage 4 chronic kidney disease, or unspecified chronic kidney disease: Secondary | ICD-10-CM | POA: Diagnosis not present

## 2023-10-07 DIAGNOSIS — I272 Pulmonary hypertension, unspecified: Secondary | ICD-10-CM | POA: Diagnosis not present

## 2023-10-07 DIAGNOSIS — I3139 Other pericardial effusion (noninflammatory): Secondary | ICD-10-CM | POA: Diagnosis not present

## 2023-10-07 DIAGNOSIS — I2510 Atherosclerotic heart disease of native coronary artery without angina pectoris: Secondary | ICD-10-CM | POA: Diagnosis not present

## 2023-10-07 DIAGNOSIS — I480 Paroxysmal atrial fibrillation: Secondary | ICD-10-CM | POA: Diagnosis not present

## 2023-10-07 DIAGNOSIS — Z7901 Long term (current) use of anticoagulants: Secondary | ICD-10-CM | POA: Diagnosis not present

## 2023-10-07 DIAGNOSIS — I081 Rheumatic disorders of both mitral and tricuspid valves: Secondary | ICD-10-CM | POA: Diagnosis not present

## 2023-10-08 ENCOUNTER — Telehealth: Payer: Self-pay | Admitting: *Deleted

## 2023-10-08 NOTE — Telephone Encounter (Signed)
Call from patient questions about her Entresto.  49-51 mg.  Patient was told that she is to take 1 tablet 2 times a day.  Patient has gotten med from pharmacy and will take as directed.

## 2023-10-12 ENCOUNTER — Telehealth: Payer: Self-pay | Admitting: *Deleted

## 2023-10-12 NOTE — Telephone Encounter (Signed)
Call from patient states when she took the new dose of the Entresto she had Palpitations, ,sweats, got nervous and shakiness. Stopped the new dose and went back to the old dose.

## 2023-10-12 NOTE — Telephone Encounter (Signed)
Ok. Thanks for letting me know. That is unusual because the old dose she was using was stronger. She was using the 200mg  dose, and I had changed her to 100mg . I can follow up with her about this at our next visit, but seems fine for now if she is tolerating 200mg  ok.

## 2023-10-12 NOTE — Telephone Encounter (Signed)
RTC to patient will continue to take the old dose of the [facility].  Told patient will be discussed at her next appointment with Dr. Oswaldo Done.

## 2023-10-13 DIAGNOSIS — Z7901 Long term (current) use of anticoagulants: Secondary | ICD-10-CM | POA: Diagnosis not present

## 2023-10-13 DIAGNOSIS — J9611 Chronic respiratory failure with hypoxia: Secondary | ICD-10-CM | POA: Diagnosis not present

## 2023-10-13 DIAGNOSIS — I700 Atherosclerosis of aorta: Secondary | ICD-10-CM | POA: Diagnosis not present

## 2023-10-13 DIAGNOSIS — E7800 Pure hypercholesterolemia, unspecified: Secondary | ICD-10-CM | POA: Diagnosis not present

## 2023-10-13 DIAGNOSIS — E113399 Type 2 diabetes mellitus with moderate nonproliferative diabetic retinopathy without macular edema, unspecified eye: Secondary | ICD-10-CM | POA: Diagnosis not present

## 2023-10-13 DIAGNOSIS — I272 Pulmonary hypertension, unspecified: Secondary | ICD-10-CM | POA: Diagnosis not present

## 2023-10-13 DIAGNOSIS — I495 Sick sinus syndrome: Secondary | ICD-10-CM | POA: Diagnosis not present

## 2023-10-13 DIAGNOSIS — E559 Vitamin D deficiency, unspecified: Secondary | ICD-10-CM | POA: Diagnosis not present

## 2023-10-13 DIAGNOSIS — N1831 Chronic kidney disease, stage 3a: Secondary | ICD-10-CM | POA: Diagnosis not present

## 2023-10-13 DIAGNOSIS — E1122 Type 2 diabetes mellitus with diabetic chronic kidney disease: Secondary | ICD-10-CM | POA: Diagnosis not present

## 2023-10-13 DIAGNOSIS — J449 Chronic obstructive pulmonary disease, unspecified: Secondary | ICD-10-CM | POA: Diagnosis not present

## 2023-10-13 DIAGNOSIS — I480 Paroxysmal atrial fibrillation: Secondary | ICD-10-CM | POA: Diagnosis not present

## 2023-10-13 DIAGNOSIS — Z794 Long term (current) use of insulin: Secondary | ICD-10-CM | POA: Diagnosis not present

## 2023-10-13 DIAGNOSIS — I2510 Atherosclerotic heart disease of native coronary artery without angina pectoris: Secondary | ICD-10-CM | POA: Diagnosis not present

## 2023-10-13 DIAGNOSIS — E874 Mixed disorder of acid-base balance: Secondary | ICD-10-CM | POA: Diagnosis not present

## 2023-10-13 DIAGNOSIS — I130 Hypertensive heart and chronic kidney disease with heart failure and stage 1 through stage 4 chronic kidney disease, or unspecified chronic kidney disease: Secondary | ICD-10-CM | POA: Diagnosis not present

## 2023-10-13 DIAGNOSIS — I081 Rheumatic disorders of both mitral and tricuspid valves: Secondary | ICD-10-CM | POA: Diagnosis not present

## 2023-10-13 DIAGNOSIS — I252 Old myocardial infarction: Secondary | ICD-10-CM | POA: Diagnosis not present

## 2023-10-13 DIAGNOSIS — I5023 Acute on chronic systolic (congestive) heart failure: Secondary | ICD-10-CM | POA: Diagnosis not present

## 2023-10-13 DIAGNOSIS — D631 Anemia in chronic kidney disease: Secondary | ICD-10-CM | POA: Diagnosis not present

## 2023-10-13 DIAGNOSIS — I3139 Other pericardial effusion (noninflammatory): Secondary | ICD-10-CM | POA: Diagnosis not present

## 2023-10-13 DIAGNOSIS — I5032 Chronic diastolic (congestive) heart failure: Secondary | ICD-10-CM | POA: Diagnosis not present

## 2023-10-13 NOTE — Telephone Encounter (Signed)
Received a call from Selz PT with [facility] who is currently in the pt's home. Stated the pt told her Entresto 49-51 mg is making her sick. I told her another nurse had called the pt yesterday and told the pt to take the old dose( 97-103) since it does not make her sick and will be discussed at her next appt on Dec 9. This was also relayed again to the pt - stated she understands.

## 2023-10-15 ENCOUNTER — Ambulatory Visit: Payer: Self-pay

## 2023-10-16 ENCOUNTER — Other Ambulatory Visit: Payer: Self-pay

## 2023-10-16 ENCOUNTER — Emergency Department (HOSPITAL_COMMUNITY): Payer: 59

## 2023-10-16 ENCOUNTER — Encounter (HOSPITAL_COMMUNITY): Payer: Self-pay

## 2023-10-16 ENCOUNTER — Inpatient Hospital Stay (HOSPITAL_COMMUNITY)
Admission: EM | Admit: 2023-10-16 | Discharge: 2023-11-24 | DRG: 291 | Disposition: E | Payer: 59 | Attending: Internal Medicine | Admitting: Internal Medicine

## 2023-10-16 DIAGNOSIS — Z8616 Personal history of COVID-19: Secondary | ICD-10-CM | POA: Diagnosis not present

## 2023-10-16 DIAGNOSIS — E11319 Type 2 diabetes mellitus with unspecified diabetic retinopathy without macular edema: Secondary | ICD-10-CM | POA: Diagnosis present

## 2023-10-16 DIAGNOSIS — I2489 Other forms of acute ischemic heart disease: Secondary | ICD-10-CM | POA: Diagnosis not present

## 2023-10-16 DIAGNOSIS — J9690 Respiratory failure, unspecified, unspecified whether with hypoxia or hypercapnia: Secondary | ICD-10-CM | POA: Diagnosis not present

## 2023-10-16 DIAGNOSIS — I5032 Chronic diastolic (congestive) heart failure: Secondary | ICD-10-CM | POA: Diagnosis not present

## 2023-10-16 DIAGNOSIS — I2729 Other secondary pulmonary hypertension: Secondary | ICD-10-CM | POA: Diagnosis present

## 2023-10-16 DIAGNOSIS — I5033 Acute on chronic diastolic (congestive) heart failure: Secondary | ICD-10-CM | POA: Diagnosis present

## 2023-10-16 DIAGNOSIS — Z515 Encounter for palliative care: Secondary | ICD-10-CM

## 2023-10-16 DIAGNOSIS — R6889 Other general symptoms and signs: Secondary | ICD-10-CM | POA: Diagnosis not present

## 2023-10-16 DIAGNOSIS — E66812 Obesity, class 2: Secondary | ICD-10-CM | POA: Diagnosis present

## 2023-10-16 DIAGNOSIS — Z9981 Dependence on supplemental oxygen: Secondary | ICD-10-CM

## 2023-10-16 DIAGNOSIS — E873 Alkalosis: Secondary | ICD-10-CM | POA: Diagnosis present

## 2023-10-16 DIAGNOSIS — Z888 Allergy status to other drugs, medicaments and biological substances status: Secondary | ICD-10-CM

## 2023-10-16 DIAGNOSIS — I10 Essential (primary) hypertension: Secondary | ICD-10-CM | POA: Diagnosis not present

## 2023-10-16 DIAGNOSIS — Z7901 Long term (current) use of anticoagulants: Secondary | ICD-10-CM

## 2023-10-16 DIAGNOSIS — I272 Pulmonary hypertension, unspecified: Secondary | ICD-10-CM | POA: Diagnosis not present

## 2023-10-16 DIAGNOSIS — Z66 Do not resuscitate: Secondary | ICD-10-CM | POA: Diagnosis not present

## 2023-10-16 DIAGNOSIS — J441 Chronic obstructive pulmonary disease with (acute) exacerbation: Secondary | ICD-10-CM | POA: Diagnosis not present

## 2023-10-16 DIAGNOSIS — I110 Hypertensive heart disease with heart failure: Secondary | ICD-10-CM | POA: Diagnosis not present

## 2023-10-16 DIAGNOSIS — I469 Cardiac arrest, cause unspecified: Secondary | ICD-10-CM | POA: Diagnosis present

## 2023-10-16 DIAGNOSIS — J9622 Acute and chronic respiratory failure with hypercapnia: Secondary | ICD-10-CM | POA: Diagnosis not present

## 2023-10-16 DIAGNOSIS — E785 Hyperlipidemia, unspecified: Secondary | ICD-10-CM | POA: Diagnosis present

## 2023-10-16 DIAGNOSIS — Z87891 Personal history of nicotine dependence: Secondary | ICD-10-CM | POA: Diagnosis not present

## 2023-10-16 DIAGNOSIS — J984 Other disorders of lung: Secondary | ICD-10-CM | POA: Diagnosis not present

## 2023-10-16 DIAGNOSIS — J9601 Acute respiratory failure with hypoxia: Secondary | ICD-10-CM | POA: Diagnosis not present

## 2023-10-16 DIAGNOSIS — J449 Chronic obstructive pulmonary disease, unspecified: Secondary | ICD-10-CM | POA: Diagnosis not present

## 2023-10-16 DIAGNOSIS — G4733 Obstructive sleep apnea (adult) (pediatric): Secondary | ICD-10-CM | POA: Diagnosis present

## 2023-10-16 DIAGNOSIS — I509 Heart failure, unspecified: Secondary | ICD-10-CM | POA: Diagnosis not present

## 2023-10-16 DIAGNOSIS — R0603 Acute respiratory distress: Secondary | ICD-10-CM | POA: Diagnosis not present

## 2023-10-16 DIAGNOSIS — Z79899 Other long term (current) drug therapy: Secondary | ICD-10-CM

## 2023-10-16 DIAGNOSIS — J90 Pleural effusion, not elsewhere classified: Secondary | ICD-10-CM | POA: Diagnosis not present

## 2023-10-16 DIAGNOSIS — Z794 Long term (current) use of insulin: Secondary | ICD-10-CM | POA: Diagnosis not present

## 2023-10-16 DIAGNOSIS — R079 Chest pain, unspecified: Secondary | ICD-10-CM | POA: Diagnosis not present

## 2023-10-16 DIAGNOSIS — K219 Gastro-esophageal reflux disease without esophagitis: Secondary | ICD-10-CM | POA: Diagnosis present

## 2023-10-16 DIAGNOSIS — R0989 Other specified symptoms and signs involving the circulatory and respiratory systems: Secondary | ICD-10-CM | POA: Diagnosis not present

## 2023-10-16 DIAGNOSIS — R739 Hyperglycemia, unspecified: Secondary | ICD-10-CM | POA: Diagnosis not present

## 2023-10-16 DIAGNOSIS — Z833 Family history of diabetes mellitus: Secondary | ICD-10-CM

## 2023-10-16 DIAGNOSIS — J9811 Atelectasis: Secondary | ICD-10-CM | POA: Diagnosis not present

## 2023-10-16 DIAGNOSIS — N183 Chronic kidney disease, stage 3 unspecified: Secondary | ICD-10-CM | POA: Diagnosis present

## 2023-10-16 DIAGNOSIS — G4734 Idiopathic sleep related nonobstructive alveolar hypoventilation: Secondary | ICD-10-CM | POA: Diagnosis present

## 2023-10-16 DIAGNOSIS — J9621 Acute and chronic respiratory failure with hypoxia: Principal | ICD-10-CM | POA: Diagnosis present

## 2023-10-16 DIAGNOSIS — R300 Dysuria: Secondary | ICD-10-CM | POA: Diagnosis present

## 2023-10-16 DIAGNOSIS — R918 Other nonspecific abnormal finding of lung field: Secondary | ICD-10-CM | POA: Diagnosis not present

## 2023-10-16 DIAGNOSIS — I5043 Acute on chronic combined systolic (congestive) and diastolic (congestive) heart failure: Principal | ICD-10-CM | POA: Diagnosis present

## 2023-10-16 DIAGNOSIS — I130 Hypertensive heart and chronic kidney disease with heart failure and stage 1 through stage 4 chronic kidney disease, or unspecified chronic kidney disease: Secondary | ICD-10-CM | POA: Diagnosis not present

## 2023-10-16 DIAGNOSIS — I517 Cardiomegaly: Secondary | ICD-10-CM | POA: Diagnosis not present

## 2023-10-16 DIAGNOSIS — J439 Emphysema, unspecified: Secondary | ICD-10-CM | POA: Diagnosis not present

## 2023-10-16 DIAGNOSIS — R54 Age-related physical debility: Secondary | ICD-10-CM | POA: Diagnosis present

## 2023-10-16 DIAGNOSIS — E1122 Type 2 diabetes mellitus with diabetic chronic kidney disease: Secondary | ICD-10-CM | POA: Diagnosis present

## 2023-10-16 DIAGNOSIS — Z823 Family history of stroke: Secondary | ICD-10-CM

## 2023-10-16 DIAGNOSIS — E66811 Obesity, class 1: Secondary | ICD-10-CM | POA: Diagnosis present

## 2023-10-16 DIAGNOSIS — I499 Cardiac arrhythmia, unspecified: Secondary | ICD-10-CM | POA: Diagnosis not present

## 2023-10-16 DIAGNOSIS — D72829 Elevated white blood cell count, unspecified: Secondary | ICD-10-CM | POA: Diagnosis present

## 2023-10-16 DIAGNOSIS — E113393 Type 2 diabetes mellitus with moderate nonproliferative diabetic retinopathy without macular edema, bilateral: Secondary | ICD-10-CM | POA: Diagnosis not present

## 2023-10-16 DIAGNOSIS — E1165 Type 2 diabetes mellitus with hyperglycemia: Secondary | ICD-10-CM | POA: Diagnosis present

## 2023-10-16 DIAGNOSIS — K760 Fatty (change of) liver, not elsewhere classified: Secondary | ICD-10-CM | POA: Diagnosis not present

## 2023-10-16 DIAGNOSIS — Z7189 Other specified counseling: Secondary | ICD-10-CM | POA: Diagnosis not present

## 2023-10-16 DIAGNOSIS — J9611 Chronic respiratory failure with hypoxia: Secondary | ICD-10-CM | POA: Diagnosis not present

## 2023-10-16 DIAGNOSIS — R509 Fever, unspecified: Secondary | ICD-10-CM | POA: Diagnosis not present

## 2023-10-16 DIAGNOSIS — Z8249 Family history of ischemic heart disease and other diseases of the circulatory system: Secondary | ICD-10-CM

## 2023-10-16 DIAGNOSIS — J811 Chronic pulmonary edema: Secondary | ICD-10-CM | POA: Diagnosis not present

## 2023-10-16 DIAGNOSIS — I5021 Acute systolic (congestive) heart failure: Secondary | ICD-10-CM | POA: Diagnosis not present

## 2023-10-16 DIAGNOSIS — N179 Acute kidney failure, unspecified: Secondary | ICD-10-CM | POA: Diagnosis present

## 2023-10-16 DIAGNOSIS — Z7985 Long-term (current) use of injectable non-insulin antidiabetic drugs: Secondary | ICD-10-CM

## 2023-10-16 DIAGNOSIS — I5031 Acute diastolic (congestive) heart failure: Secondary | ICD-10-CM | POA: Diagnosis not present

## 2023-10-16 DIAGNOSIS — Z743 Need for continuous supervision: Secondary | ICD-10-CM | POA: Diagnosis not present

## 2023-10-16 DIAGNOSIS — I480 Paroxysmal atrial fibrillation: Secondary | ICD-10-CM | POA: Diagnosis present

## 2023-10-16 DIAGNOSIS — I2510 Atherosclerotic heart disease of native coronary artery without angina pectoris: Secondary | ICD-10-CM | POA: Diagnosis present

## 2023-10-16 DIAGNOSIS — R0602 Shortness of breath: Secondary | ICD-10-CM | POA: Diagnosis not present

## 2023-10-16 DIAGNOSIS — E113399 Type 2 diabetes mellitus with moderate nonproliferative diabetic retinopathy without macular edema, unspecified eye: Secondary | ICD-10-CM | POA: Diagnosis present

## 2023-10-16 DIAGNOSIS — F419 Anxiety disorder, unspecified: Secondary | ICD-10-CM | POA: Diagnosis present

## 2023-10-16 DIAGNOSIS — Z6830 Body mass index (BMI) 30.0-30.9, adult: Secondary | ICD-10-CM

## 2023-10-16 LAB — BASIC METABOLIC PANEL
Anion gap: 12 (ref 5–15)
BUN: 29 mg/dL — ABNORMAL HIGH (ref 8–23)
CO2: 31 mmol/L (ref 22–32)
Calcium: 8.6 mg/dL — ABNORMAL LOW (ref 8.9–10.3)
Chloride: 92 mmol/L — ABNORMAL LOW (ref 98–111)
Creatinine, Ser: 1.13 mg/dL — ABNORMAL HIGH (ref 0.44–1.00)
GFR, Estimated: 54 mL/min — ABNORMAL LOW (ref >60)
Glucose, Bld: 256 mg/dL — ABNORMAL HIGH (ref 70–99)
Potassium: 4.4 mmol/L (ref 3.5–5.1)
Sodium: 135 mmol/L (ref 135–145)

## 2023-10-16 LAB — URINALYSIS, ROUTINE W REFLEX MICROSCOPIC
Bilirubin Urine: NEGATIVE
Glucose, UA: 50 mg/dL — AB
Hgb urine dipstick: NEGATIVE
Ketones, ur: NEGATIVE mg/dL
Leukocytes,Ua: NEGATIVE
Nitrite: NEGATIVE
Protein, ur: >=300 mg/dL — AB
Specific Gravity, Urine: 1.012 (ref 1.005–1.030)
pH: 5 (ref 5.0–8.0)

## 2023-10-16 LAB — CBC
HCT: 34.7 % — ABNORMAL LOW (ref 36.0–46.0)
Hemoglobin: 10.4 g/dL — ABNORMAL LOW (ref 12.0–15.0)
MCH: 26.1 pg (ref 26.0–34.0)
MCHC: 30 g/dL (ref 30.0–36.0)
MCV: 87.2 fL (ref 80.0–100.0)
Platelets: 283 10*3/uL (ref 150–400)
RBC: 3.98 MIL/uL (ref 3.87–5.11)
RDW: 17.3 % — ABNORMAL HIGH (ref 11.5–15.5)
WBC: 7.5 10*3/uL (ref 4.0–10.5)
nRBC: 0 % (ref 0.0–0.2)

## 2023-10-16 LAB — TROPONIN I (HIGH SENSITIVITY)
Troponin I (High Sensitivity): 19 ng/L — ABNORMAL HIGH (ref <18)
Troponin I (High Sensitivity): 24 ng/L — ABNORMAL HIGH (ref <18)

## 2023-10-16 LAB — I-STAT VENOUS BLOOD GAS, ED
Acid-Base Excess: 11 mmol/L — ABNORMAL HIGH (ref 0.0–2.0)
Bicarbonate: 39.2 mmol/L — ABNORMAL HIGH (ref 20.0–28.0)
Calcium, Ion: 1.14 mmol/L — ABNORMAL LOW (ref 1.15–1.40)
HCT: 36 % (ref 36.0–46.0)
Hemoglobin: 12.2 g/dL (ref 12.0–15.0)
O2 Saturation: 44 %
Potassium: 4.4 mmol/L (ref 3.5–5.1)
Sodium: 135 mmol/L (ref 135–145)
TCO2: 41 mmol/L — ABNORMAL HIGH (ref 22–32)
pCO2, Ven: 73.2 mm[Hg] (ref 44–60)
pH, Ven: 7.337 (ref 7.25–7.43)
pO2, Ven: 27 mm[Hg] — CL (ref 32–45)

## 2023-10-16 LAB — MAGNESIUM: Magnesium: 1.5 mg/dL — ABNORMAL LOW (ref 1.7–2.4)

## 2023-10-16 LAB — GLUCOSE, CAPILLARY
Glucose-Capillary: 240 mg/dL — ABNORMAL HIGH (ref 70–99)
Glucose-Capillary: 292 mg/dL — ABNORMAL HIGH (ref 70–99)

## 2023-10-16 LAB — CBG MONITORING, ED: Glucose-Capillary: 213 mg/dL — ABNORMAL HIGH (ref 70–99)

## 2023-10-16 LAB — BRAIN NATRIURETIC PEPTIDE: B Natriuretic Peptide: 2554.5 pg/mL — ABNORMAL HIGH (ref 0.0–100.0)

## 2023-10-16 MED ORDER — INSULIN GLARGINE-YFGN 100 UNIT/ML ~~LOC~~ SOLN
20.0000 [IU] | Freq: Every day | SUBCUTANEOUS | Status: DC
  Administered 2023-10-16 – 2023-10-17 (×2): 20 [IU] via SUBCUTANEOUS
  Filled 2023-10-16 (×5): qty 0.2

## 2023-10-16 MED ORDER — EZETIMIBE 10 MG PO TABS
10.0000 mg | ORAL_TABLET | Freq: Every day | ORAL | Status: DC
  Administered 2023-10-17 – 2023-10-30 (×14): 10 mg via ORAL
  Filled 2023-10-16 (×15): qty 1

## 2023-10-16 MED ORDER — FUROSEMIDE 10 MG/ML IJ SOLN
80.0000 mg | Freq: Once | INTRAMUSCULAR | Status: AC
  Administered 2023-10-17: 80 mg via INTRAVENOUS
  Filled 2023-10-16: qty 8

## 2023-10-16 MED ORDER — ACETAMINOPHEN 325 MG PO TABS
650.0000 mg | ORAL_TABLET | Freq: Four times a day (QID) | ORAL | Status: DC | PRN
  Administered 2023-10-17 – 2023-10-30 (×3): 650 mg via ORAL
  Filled 2023-10-16 (×4): qty 2

## 2023-10-16 MED ORDER — ATORVASTATIN CALCIUM 80 MG PO TABS
80.0000 mg | ORAL_TABLET | Freq: Every day | ORAL | Status: DC
  Administered 2023-10-17 – 2023-10-30 (×14): 80 mg via ORAL
  Filled 2023-10-16 (×8): qty 1
  Filled 2023-10-16: qty 2
  Filled 2023-10-16 (×6): qty 1

## 2023-10-16 MED ORDER — REVEFENACIN 175 MCG/3ML IN SOLN
175.0000 ug | Freq: Every day | RESPIRATORY_TRACT | Status: DC
  Administered 2023-10-17: 175 ug via RESPIRATORY_TRACT
  Filled 2023-10-16: qty 3

## 2023-10-16 MED ORDER — INSULIN ASPART 100 UNIT/ML IJ SOLN
0.0000 [IU] | Freq: Three times a day (TID) | INTRAMUSCULAR | Status: DC
  Administered 2023-10-16: 3 [IU] via SUBCUTANEOUS

## 2023-10-16 MED ORDER — MAGNESIUM SULFATE 4 GM/100ML IV SOLN
4.0000 g | Freq: Once | INTRAVENOUS | Status: AC
  Administered 2023-10-16: 4 g via INTRAVENOUS
  Filled 2023-10-16: qty 100

## 2023-10-16 MED ORDER — ALBUTEROL SULFATE (2.5 MG/3ML) 0.083% IN NEBU
2.5000 mg | INHALATION_SOLUTION | RESPIRATORY_TRACT | Status: DC | PRN
  Administered 2023-10-16: 2.5 mg via RESPIRATORY_TRACT

## 2023-10-16 MED ORDER — ALBUTEROL SULFATE (2.5 MG/3ML) 0.083% IN NEBU
INHALATION_SOLUTION | RESPIRATORY_TRACT | Status: AC
  Filled 2023-10-16: qty 3

## 2023-10-16 MED ORDER — IPRATROPIUM-ALBUTEROL 0.5-2.5 (3) MG/3ML IN SOLN: 3.0000 mL | Freq: Four times a day (QID) | RESPIRATORY_TRACT | Status: DC

## 2023-10-16 MED ORDER — UMECLIDINIUM BROMIDE 62.5 MCG/ACT IN AEPB
1.0000 | INHALATION_SPRAY | Freq: Every day | RESPIRATORY_TRACT | Status: DC
  Filled 2023-10-16: qty 7

## 2023-10-16 MED ORDER — FUROSEMIDE 10 MG/ML IJ SOLN
40.0000 mg | Freq: Once | INTRAMUSCULAR | Status: AC
Start: 2023-10-16 — End: 2023-10-16
  Administered 2023-10-16: 40 mg via INTRAVENOUS
  Filled 2023-10-16: qty 4

## 2023-10-16 MED ORDER — APIXABAN 5 MG PO TABS
5.0000 mg | ORAL_TABLET | Freq: Two times a day (BID) | ORAL | Status: DC
  Administered 2023-10-16 – 2023-10-30 (×29): 5 mg via ORAL
  Filled 2023-10-16 (×30): qty 1

## 2023-10-16 MED ORDER — ARFORMOTEROL TARTRATE 15 MCG/2ML IN NEBU
15.0000 ug | INHALATION_SOLUTION | Freq: Two times a day (BID) | RESPIRATORY_TRACT | Status: DC
  Administered 2023-10-16 – 2023-10-31 (×30): 15 ug via RESPIRATORY_TRACT
  Filled 2023-10-16 (×30): qty 2

## 2023-10-16 MED ORDER — POLYETHYLENE GLYCOL 3350 17 G PO PACK: 17.0000 g | PACK | Freq: Every day | ORAL | Status: DC | PRN

## 2023-10-16 MED ORDER — RIVAROXABAN 10 MG PO TABS
10.0000 mg | ORAL_TABLET | Freq: Every day | ORAL | Status: DC
Start: 2023-10-16 — End: 2023-10-16

## 2023-10-16 MED ORDER — FLUTICASONE FUROATE-VILANTEROL 200-25 MCG/ACT IN AEPB
1.0000 | INHALATION_SPRAY | Freq: Every day | RESPIRATORY_TRACT | Status: DC
  Filled 2023-10-16: qty 28

## 2023-10-16 MED ORDER — IPRATROPIUM BROMIDE 0.02 % IN SOLN
0.5000 mg | RESPIRATORY_TRACT | Status: DC | PRN
  Administered 2023-10-16 – 2023-10-20 (×3): 0.5 mg via RESPIRATORY_TRACT
  Filled 2023-10-16 (×3): qty 2.5

## 2023-10-16 MED ORDER — ACETAMINOPHEN 650 MG RE SUPP: 650.0000 mg | Freq: Four times a day (QID) | RECTAL | Status: DC | PRN

## 2023-10-16 MED ORDER — FUROSEMIDE 10 MG/ML IJ SOLN
80.0000 mg | Freq: Once | INTRAMUSCULAR | Status: AC
  Administered 2023-10-16: 80 mg via INTRAVENOUS
  Filled 2023-10-16: qty 8

## 2023-10-16 MED ORDER — AMLODIPINE BESYLATE 10 MG PO TABS
10.0000 mg | ORAL_TABLET | Freq: Every day | ORAL | Status: DC
  Administered 2023-10-17 – 2023-10-30 (×14): 10 mg via ORAL
  Filled 2023-10-16 (×15): qty 1
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
## 2023-10-16 NOTE — Hospital Course (Addendum)
#Acute on Chronic Hypoxic Respiratory Failure #Acute on Chronic HF Exacerbation #Acute COPD exacerbation  Presented with worsening dyspnea and fatigue for 1 week with increasing oxygen requirement today. Initially placed on NRB, was saturating at 69%, then placed on BiPAP in ED. CXR showed mild pulmonary vascular congestion, with small bilateral layering pleural effusions. BNP >2000; VBG shows pH 7.3, bicarb 39.2, pCO2 73.2; lung exam notable for bilateral rales and bibasilar crackles. She also had 1+ pitting edema below mid-shin bilaterally. She was given one dose of IV lasix 40 mg in ED; she responded well with improved breathing. She has received total 3 doses of IV Lasix 80 mg since admission, with total of -910 urine output. Echo shows EF 50-55%, unchanged from previous 07/2023. Per lung exam today, she has diffuse crackles and diminished breath sounds notable anteriorly, concerned for COPD exacerbation. She is given one IV dose of methylprednisolone on 11/25 and started on IV Zithromax 3 days. Per evaluation 11/26, concerned about worsening HF; repeat CXR confirmed. She is given two more rounds of lasix 80 mg BID 11/26 with little output recorded. Per chart review, during her last hospitalization, she responded well to IV Lasix 120 mg.  We gave her IV Lasix 120 mg 3 times daily on 11/27, stopped her prednisone, she responded well with ~3 L output.  She finished her azithromycin 3-day course. 11/28 she is given two doses of IV lasix 120mg  + 250 mg Diamox to help with her contraction alkalosis.    Per evaluation today, no wheezing or crackles noted. No lower extremity edema bilaterally.  Plan to do IV Lasix 120 mg twice daily today with one-time dose of IV Diamox to 50 mg for contraction alkalosis.  Per RT, patient is not able to maintain oxygen saturation above 88% when she is on high flow nasal cannula, will plan to switch her to [facility] and see how she tolerates.    Plan:  - Transition to HHFNC, goal O2  > 88% - IV Lasix 120 mg twice daily today - One-time dose of IV Diamox 250 mg today for contraction alkalosis  - Continue Yupelri, Budesonide and Atrovent, breathing treatments  - BiPAP at bedtime, NPO when on BiPAP - Daily weight checks with Strict Is & Os - PT/OT recommended Home health PT/OT    #AKI #Contraction Alkalosis  #Cardiorenal syndrome  Presented with Cr 1.13, elevated from 0.75 on 09/03/2023 likely pre-renal, in the setting of HF exacerbation. Cr continues to trend higher in the setting of high dose lasix; She also had concerns of straining while urinating in the ED. UA negative infection. Bladder scan on 11/24 showed 0 PVR. There is likely a component of cardiorenal syndrome that is playing in role in her AKI in the setting of worsening HF exacerbation.  Will do a trial of one-time dose of IV Diamox to 50 mg today for contraction alkalosis. - Continue to trent BMP    #Goals of care conversation Initially we talked to the patient about palliative in the setting of her multiple hospitalization this year, end stage diseases that are affecting the quality of her life. She was against the idea and mentioned that she wants to continue fighting. Today, she brought up the topic of her end stage diseases, HF and COPD, and wanted to have a family meeting to discuss goals of care. I have called her daughter Artist [place], she did not pick up. Plan is to set up a family meeting with the patient and talk about palliative care  and figure out what the patient values: quality vs quantity of life.    #Chest pain, noncardiac  #Elevated Troponin #Demand Ischemia  Reported chest pain for the past 4 days, muscular like that comes and goes, worsened with movement. Pain is reproducible upon palpation. Troponin elevated to 19, 24, low suspicion for ACS at this time. Likely demand ischemia in the setting of volume overload. Tylenol as needed for pain     #Hypomagnesemia  Resolved.    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 -------------------------------------------------------------------------------------------------------

## 2023-10-16 NOTE — Progress Notes (Signed)
Patient very labored on transfer to floor. Bed weight obtained. Patient came to floor on high flow with 8L. Had to increase to 14L HFNC- SpO2 still hanging around 88%. Respiratory to be called.
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
## 2023-10-16 NOTE — Patient Outreach (Signed)
  Care Coordination   Follow Up Visit Note   10/16/2023 Name: Alicia [name] MRN: [id number] DOB: Jul 20, 1956  Alicia [name] is a 67 y.o. year old female who sees Oswaldo Done, Marquita Palms, MD for primary care. I spoke with  Riquel Zottola Hansson by phone today.  What matters to the patients health and wellness today?  Alicia [name] reported experiencing significant challenges following her discharge from the hospital due to respiratory issues. She has a cold and perceives that this is adversely affecting her well-being; however, she noted an improvement in her condition today. Fatigue has been a persistent problem, which has stopped her from leaving her residence. She is also encountering difficulties in preparing for Thanksgiving dinner. I advised her to prioritize rest and engage in activities at a moderate pace.  In terms of her oxygen usage, she is currently using 7.5 liters while at rest, and may increase this to 8.5 liters when she undertakes movement. Occasionally, she experiences thick mucus, for which she uses Mucinex and increases her fluid intake to alleviate the issue. She is adhering to her prescribed medication regimen. Alicia [name] acknowledged that should her condition get worse, she will call 911 for assistance.     Goals Addressed             This Visit's Progress    I want to learn about my CHF, COPD and keep it under control       Patient Goals/Self Care Activities: -Patient/Caregiver will self-administer medications as prescribed as evidenced by self-report/primary caregiver report  -Patient/Caregiver will attend all scheduled provider appointments as evidenced by clinician review of documented attendance to scheduled appointments and patient/caregiver report -Patient/Caregiver will call pharmacy for medication refills as evidenced by patient report and review of pharmacy fill history as appropriate -Patient/Caregiver will call provider office for new concerns or questions as  evidenced by review of documented incoming telephone call notes and patient report -Patient/Caregiver verbalizes understanding of plan -Patient/Caregiver will focus on medication adherence by taking medications as prescribed  -Weigh daily and record (notify MD with 3 lb weight gain over night or 5 lb in a week) -Follow CHF Action Plan -Adhere to low sodium diet  -Continue to be careful in the sun stay hydrated Provided instruction about proper use of medications used for management of COPD including inhalers Advised patient to self assesses COPD action plan zone and make appointment with provider if in the yellow zone for 48 hours without improvement Discussed the importance of adequate rest and management of fatigue with COPD Continue to evaluate your condition If it becomes worse before your appointment call 911 Take extra tank to appointments to cover you         SDOH assessments and interventions completed:  No     Care Coordination Interventions:  Yes, provided   Interventions Today    Flowsheet Row Most Recent Value  Chronic Disease   Chronic disease during today's visit Chronic Obstructive Pulmonary Disease (COPD)  General Interventions   General Interventions Discussed/Reviewed General Interventions Discussed  Pharmacy Interventions   Pharmacy Dicussed/Reviewed Pharmacy Topics Discussed  Safety Interventions   Safety Discussed/Reviewed Safety Discussed        Follow up plan: Follow up call scheduled for 11/04/23  3 pm    Encounter Outcome:  Patient Visit Completed   [name] RN, BSN, [facility]  [facility], [facility]  Care Coordinator Phone: [phone]

## 2023-10-16 NOTE — Patient Instructions (Signed)
Visit Information  Thank you for taking time to visit with me today. Please don't hesitate to contact me if I can be of assistance to you.   Following are the goals we discussed today:   Goals Addressed             This Visit's Progress    I want to learn about my CHF, COPD and keep it under control       Patient Goals/Self Care Activities: -Patient/Caregiver will self-administer medications as prescribed as evidenced by self-report/primary caregiver report  -Patient/Caregiver will attend all scheduled provider appointments as evidenced by clinician review of documented attendance to scheduled appointments and patient/caregiver report -Patient/Caregiver will call pharmacy for medication refills as evidenced by patient report and review of pharmacy fill history as appropriate -Patient/Caregiver will call provider office for new concerns or questions as evidenced by review of documented incoming telephone call notes and patient report -Patient/Caregiver verbalizes understanding of plan -Patient/Caregiver will focus on medication adherence by taking medications as prescribed  -Weigh daily and record (notify MD with 3 lb weight gain over night or 5 lb in a week) -Follow CHF Action Plan -Adhere to low sodium diet  -Continue to be careful in the sun stay hydrated Provided instruction about proper use of medications used for management of COPD including inhalers Advised patient to self assesses COPD action plan zone and make appointment with provider if in the yellow zone for 48 hours without improvement Discussed the importance of adequate rest and management of fatigue with COPD Continue to evaluate your condition If it becomes worse before your appointment call 911 Take extra tank to appointments to cover you         Our next appointment is by telephone on 11/04/23 at 3 pm  Please call the care guide team at [phone] if you need to cancel or reschedule your appointment.   If  you are experiencing a Mental Health or Behavioral Health Crisis or need someone to talk to, please call 1-800-273-TALK (toll free, 24 hour hotline)  Patient verbalizes understanding of instructions and care plan provided today and agrees to view in MyChart. Active MyChart status and patient understanding of how to access instructions and care plan via MyChart confirmed with patient.     [name] RN, BSN, [facility]   [facility], [facility]  Care Coordinator Phone: [phone]

## 2023-10-16 NOTE — ED Provider Notes (Signed)
[place] EMERGENCY DEPARTMENT AT [facility] Provider Note   CSN: [id number] Arrival date & time: 10/16/23  1325     History  Chief Complaint  Patient presents with   Respiratory Distress    Alicia [name] is a 67 y.o. female.  HPI    67 year old female with history of diabetes, congestive heart failure, hypertension and COPD comes in with chief complaint of shortness of breath.  Patient currently on BiPAP.  According to EMS, patient had called in for shortness of breath.  She was in respiratory distress.  They put her on nonrebreather.  Our staff noted that patient was still hypoxic, 70% on non-rebreather.  Patient states that she has history of CHF and COPD.  She has been having worsening shortness of breath over the last few days, however this morning her symptoms acutely worsened.  She at baseline uses oxygen at home.  She denies any chest pain.  She has no URI-like symptoms, fevers, chills, cough.  Patient states that she has been taking her medications as prescribed, however, she has been going in and out of the ER because of shortness of breath issues over the last few months.  Home Medications Prior to Admission medications   Medication Sig Start Date End Date Taking? Authorizing Provider  albuterol (PROVENTIL) (2.5 MG/3ML) 0.083% nebulizer solution INHALE 3 ML BY NEBULIZATION EVERY 6 HOURS AS NEEDED FOR WHEEZING OR SHORTNESS OF BREATH 07/30/23   Charlott Holler, MD  albuterol (VENTOLIN HFA) 108 (90 Base) MCG/ACT inhaler Inhale 2 puffs into the lungs every 6 (six) hours as needed for wheezing or shortness of breath. 09/06/23   Lovie Macadamia, MD  amLODipine (NORVASC) 10 MG tablet Take 10 mg by mouth daily.    [provider]  apixaban (ELIQUIS) 5 MG TABS tablet Take 1 tablet (5 mg total) by mouth 2 (two) times daily. 08/19/23 08/18/24  [name], MD  atorvastatin (LIPITOR) 80 MG tablet TAKE 1 TABLET BY MOUTH EVERY DAY 06/09/23   Jake Bathe, MD  Dulaglutide (TRULICITY) 1.5 MG/0.5ML SOPN Inject 1.5 mg into the skin once a week. Patient not taking: Reported on 08/08/2023 05/03/23   [name], MD  ezetimibe (ZETIA) 10 MG tablet TAKE 1 TABLET BY MOUTH EVERY DAY 07/28/23   Swinyer, Zachary George, NP  Fluticasone-Umeclidin-Vilant (TRELEGY ELLIPTA) 200-62.5-25 MCG/ACT AEPB Inhale 1 puff into the lungs daily. 09/03/23   Lovie Macadamia, MD  glucose blood (ACCU-CHEK AVIVA PLUS) test strip 1 each by Other route See admin instructions. USE TO TEST BLOOD SUGARS AS DIRECTED 08/19/23   [name], MD  insulin glargine (LANTUS) 100 UNIT/ML Solostar Pen Inject 20 Units into the skin daily. 08/17/23   Morrie Sheldon, MD  insulin lispro (HUMALOG) 100 UNIT/ML KwikPen Inject 14 Units into the skin 3 (three) times daily before meals. Only take if eating a meal AND Blood Glucose (BG) is 80 or higher. 08/17/23   Morrie Sheldon, MD  Insulin Syringe-Needle U-100 31G X 15/64" 0.3 ML MISC Use to inject Humalog before meals three times a day 05/31/19   [name], MD  Lancets (ACCU-CHEK MULTICLIX) lancets Use to check your blood sugar four times daily: early morning, before a meal, two hours after a meal, and bedtime 01/16/21   [name], MD  sacubitril-valsartan (ENTRESTO) 49-51 MG Take 1 tablet by mouth 2 (two) times daily. 10/04/23   [name], MD  spironolactone (ALDACTONE) 25 MG tablet Take 1 tablet (25  mg total) by mouth daily. 08/17/23   Morrie Sheldon, MD  torsemide (DEMADEX) 20 MG tablet Take 2 tablets (40 mg total) by mouth 2 (two) times daily. 10/07/23   [name], MD      Allergies    Canagliflozin    Review of Systems   Review of Systems  All other systems reviewed and are negative.   Physical Exam Updated Vital Signs BP (!) 143/67   Pulse (!) 55   Temp (!) 97.3 F (36.3 C) (Axillary)   Resp 19   Ht 5\' 7"  (1.702 m)   Wt 98.2 kg   LMP  (LMP Unknown)   SpO2 96%   BMI  33.91 kg/m  Physical Exam Vitals and nursing note reviewed.  Constitutional:      Appearance: She is well-developed.  HENT:     Head: Atraumatic.  Cardiovascular:     Rate and Rhythm: Normal rate.  Pulmonary:     Effort: Pulmonary effort is normal.     Breath sounds: Rales present.     Comments: On cpap Musculoskeletal:     Cervical back: Neck supple.  Skin:    General: Skin is warm.  Neurological:     Mental Status: She is alert and oriented to person, place, and time.     ED Results / Procedures / Treatments   Labs (all labs ordered are listed, but only abnormal results are displayed) Labs Reviewed  BASIC METABOLIC PANEL - Abnormal; Notable for the following components:      Result Value   Chloride 92 (*)    Glucose, Bld 256 (*)    BUN 29 (*)    Creatinine, Ser 1.13 (*)    Calcium 8.6 (*)    GFR, Estimated 54 (*)    All other components within normal limits  BRAIN NATRIURETIC PEPTIDE - Abnormal; Notable for the following components:   B Natriuretic Peptide 2,554.5 (*)    All other components within normal limits  CBC - Abnormal; Notable for the following components:   Hemoglobin 10.4 (*)    HCT 34.7 (*)    RDW 17.3 (*)    All other components within normal limits  I-STAT VENOUS BLOOD GAS, ED - Abnormal; Notable for the following components:   pCO2, Ven 73.2 (*)    pO2, Ven 27 (*)    Bicarbonate 39.2 (*)    TCO2 41 (*)    Acid-Base Excess 11.0 (*)    Calcium, Ion 1.14 (*)    All other components within normal limits  TROPONIN I (HIGH SENSITIVITY) - Abnormal; Notable for the following components:   Troponin I (High Sensitivity) 19 (*)    All other components within normal limits  TROPONIN I (HIGH SENSITIVITY)    EKG None  Radiology DG Chest Portable 1 View  Result Date: 10/16/2023 CLINICAL DATA:  Shortness of breath. EXAM: PORTABLE CHEST 1 VIEW COMPARISON:  08/06/2023. FINDINGS: Low lung volume. Mild pulmonary vascular congestion. There are small  bilateral layering pleural effusions with probable underlying atelectatic changes in the lungs. No pneumothorax. Stable cardio-mediastinal silhouette. No acute osseous abnormalities. The soft tissues are within normal limits. IMPRESSION: *Findings favor congestive heart failure/mild pulmonary edema. Electronically Signed   By: Jules Schick M.D.   On: 10/16/2023 14:17    Procedures .Critical Care  Performed by: Derwood Kaplan, MD Authorized by: Derwood Kaplan, MD   Critical care provider statement:    Critical care time (minutes):  41   Critical care time was exclusive  of:  Separately billable procedures and treating other patients   Critical care was necessary to treat or prevent imminent or life-threatening deterioration of the following conditions:  Circulatory failure and cardiac failure   Critical care was time spent personally by me on the following activities:  Development of treatment plan with patient or surrogate, discussions with consultants, evaluation of patient's response to treatment, examination of patient, ordering and review of laboratory studies, ordering and review of radiographic studies, ordering and performing treatments and interventions, pulse oximetry, re-evaluation of patient's condition and review of old charts   Care discussed with: admitting provider       Medications Ordered in ED Medications  furosemide (LASIX) injection 40 mg (40 mg Intravenous Given 10/16/23 1555)    ED Course/ Medical Decision Making/ A&P                                 Medical Decision Making Amount and/or Complexity of Data Reviewed Labs: ordered. Radiology: ordered.  Risk Prescription drug management. Decision regarding hospitalization.   This patient presents to the ED with chief complaint(s) of shortness of breath with pertinent past medical history of COPD, CHF, chronic hypoxic respiratory failure and recurrent admission to the hospital for shortness of breath/hypoxia.The  complaint involves an extensive differential diagnosis and also carries with it a high risk of complications and morbidity.    The differential diagnosis includes : Acute pulmonary edema, CHF exacerbation, COPD exacerbation, pleural effusions, viral illness, acute coronary syndrome, pulmonary edema, PE and severe anemia.  Anxiety is also a possibility.  The initial plan is to get basic labs, BNP.  On my exam patient did have subtle rales up to the mid lung level.  Patient is on BiPAP and feeling a lot more comfortable than when she had first come to the ER.   Additional history obtained: Additional history obtained from EMS  Records reviewed previous admission documents  Independent labs interpretation:  The following labs were independently interpreted: Patient's BNP is over 2500.  VBG shows mild hypercapnia with pCO2 of 73 and pH of 7.33.  Elevated troponin because of demand.  Independent visualization and interpretation of imaging: - I independently visualized the following imaging with scope of interpretation limited to determining acute life threatening conditions related to emergency care: X-ray of the chest, which revealed interstitial/pulmonary edema  Treatment and Reassessment: Patient reassessed again at 4 PM.  We will try to wean her off of BiPAP. She is stable for admission at this time.   Final Clinical Impression(s) / ED Diagnoses Final diagnoses:  Acute on chronic respiratory failure with hypoxia (HCC)  Acute congestive heart failure, unspecified heart failure type [facility])    Rx / DC Orders ED Discharge Orders     None         Derwood Kaplan, MD 10/16/23 1614

## 2023-10-16 NOTE — Progress Notes (Signed)
   10/16/23 2140  BiPAP/CPAP/SIPAP  $ Non-Invasive Ventilator  Non-Invasive Vent Initial  $ Face Mask Medium Yes  BiPAP/CPAP/SIPAP Pt Type Adult  Mask Type Full face mask  Mask Size Medium  Set Rate 14 breaths/min  Respiratory Rate 22 breaths/min  IPAP 17 cmH20  EPAP 5 cmH2O  Pressure Support 12 cmH20  PEEP 5 cmH20  FiO2 (%) 60 %  Minute Ventilation 9.1  Leak 27  Peak Inspiratory Pressure (PIP) 17  Tidal Volume (Vt) 419  Patient Home Equipment No  Auto Titrate No  Press High Alarm 45 cmH2O  CPAP/SIPAP surface wiped down Yes

## 2023-10-16 NOTE — Progress Notes (Signed)
   10/16/23 1339  Adult Ventilator Settings  Vent Type Servo i  Vent Mode PSV;BIPAP  FiO2 (%) 60 %  IPAP 16 cmH20  EPAP 8 cmH20   RT called at bedside due to increase WOB and oxygen desaturation w/NRB. Pt was placed on BiPAP and is tolerating well at this time. Bilateral course crackles breath sounds were auscultated.

## 2023-10-16 NOTE — ED Triage Notes (Addendum)
Pt arrived via [facility] from home for c/o int SOB and chest painx1wk. Per EMS, pt states the SOB got worse this morning. Pt arrived in respiratory distress. Pt wears 8L 02 per [place] at baseline. Pt arrived on NRB and EMS states pt Sa02 93-94%. Pt was 69% on NRB 15L 02. I called RT and they are at bedside. EMS gave ASA 324 mg

## 2023-10-16 NOTE — Progress Notes (Signed)
   10/16/23 1610  Oxygen Therapy/Pulse Ox  O2 Device (S)  HFNC  O2 Therapy Oxygen humidified  O2 Flow Rate (L/min) 10 L/min   Pt was transitioned from BiPAP to HFNC 10L and is currently VSS at this time. MD at bedside.

## 2023-10-16 NOTE — Progress Notes (Signed)
MD and respiratory at bedside. Duoneb treatment being administered at this time- scheduled duonebs ordered. SpO2 90%. Per MD, Bipap at night.  3 units administered. Patient reports she took the scheduled 1700 medications this morning.

## 2023-10-17 ENCOUNTER — Inpatient Hospital Stay (HOSPITAL_COMMUNITY): Payer: 59

## 2023-10-17 DIAGNOSIS — I5031 Acute diastolic (congestive) heart failure: Secondary | ICD-10-CM

## 2023-10-17 DIAGNOSIS — R300 Dysuria: Secondary | ICD-10-CM

## 2023-10-17 DIAGNOSIS — J9621 Acute and chronic respiratory failure with hypoxia: Secondary | ICD-10-CM

## 2023-10-17 DIAGNOSIS — I509 Heart failure, unspecified: Secondary | ICD-10-CM

## 2023-10-17 LAB — BLOOD GAS, VENOUS
Acid-Base Excess: 10.2 mmol/L — ABNORMAL HIGH (ref 0.0–2.0)
Acid-Base Excess: 10.7 mmol/L — ABNORMAL HIGH (ref 0.0–2.0)
Bicarbonate: 40.4 mmol/L — ABNORMAL HIGH (ref 20.0–28.0)
Bicarbonate: 39 mmol/L — ABNORMAL HIGH (ref 20.0–28.0)
O2 Saturation: 69.1 %
O2 Saturation: 69.7 %
Patient temperature: 36.4
Patient temperature: 37
pCO2, Ven: 82 mm[Hg] (ref 44–60)
pCO2, Ven: 74 mm[Hg] (ref 44–60)
pH, Ven: 7.3 (ref 7.25–7.43)
pH, Ven: 7.33 (ref 7.25–7.43)
pO2, Ven: 42 mm[Hg] (ref 32–45)
pO2, Ven: 42 mm[Hg] (ref 32–45)

## 2023-10-17 LAB — CBC
HCT: 33.6 % — ABNORMAL LOW (ref 36.0–46.0)
Hemoglobin: 10 g/dL — ABNORMAL LOW (ref 12.0–15.0)
MCH: 25.7 pg — ABNORMAL LOW (ref 26.0–34.0)
MCHC: 29.8 g/dL — ABNORMAL LOW (ref 30.0–36.0)
MCV: 86.4 fL (ref 80.0–100.0)
Platelets: 250 10*3/uL (ref 150–400)
RBC: 3.89 MIL/uL (ref 3.87–5.11)
RDW: 17.4 % — ABNORMAL HIGH (ref 11.5–15.5)
WBC: 6.9 10*3/uL (ref 4.0–10.5)
nRBC: 0.3 % — ABNORMAL HIGH (ref 0.0–0.2)

## 2023-10-17 LAB — COMPREHENSIVE METABOLIC PANEL
ALT: 17 U/L (ref 0–44)
AST: 12 U/L — ABNORMAL LOW (ref 15–41)
Albumin: 2.8 g/dL — ABNORMAL LOW (ref 3.5–5.0)
Alkaline Phosphatase: 74 U/L (ref 38–126)
Anion gap: 12 (ref 5–15)
BUN: 28 mg/dL — ABNORMAL HIGH (ref 8–23)
CO2: 31 mmol/L (ref 22–32)
Calcium: 8.6 mg/dL — ABNORMAL LOW (ref 8.9–10.3)
Chloride: 93 mmol/L — ABNORMAL LOW (ref 98–111)
Creatinine, Ser: 1.31 mg/dL — ABNORMAL HIGH (ref 0.44–1.00)
GFR, Estimated: 45 mL/min — ABNORMAL LOW (ref >60)
Glucose, Bld: 225 mg/dL — ABNORMAL HIGH (ref 70–99)
Potassium: 4.1 mmol/L (ref 3.5–5.1)
Sodium: 136 mmol/L (ref 135–145)
Total Bilirubin: 0.7 mg/dL (ref <1.2)
Total Protein: 5.7 g/dL — ABNORMAL LOW (ref 6.5–8.1)

## 2023-10-17 LAB — ECHOCARDIOGRAM COMPLETE
Area-P 1/2: 4.06 cm2
Height: 67 in
MV VTI: 1.6 cm2
S' Lateral: 4.3 cm
Weight: 3576.74 [oz_av]

## 2023-10-17 LAB — GLUCOSE, CAPILLARY
Glucose-Capillary: 200 mg/dL — ABNORMAL HIGH (ref 70–99)
Glucose-Capillary: 208 mg/dL — ABNORMAL HIGH (ref 70–99)
Glucose-Capillary: 235 mg/dL — ABNORMAL HIGH (ref 70–99)
Glucose-Capillary: 263 mg/dL — ABNORMAL HIGH (ref 70–99)

## 2023-10-17 LAB — MAGNESIUM: Magnesium: 2.4 mg/dL (ref 1.7–2.4)

## 2023-10-17 MED ORDER — FUROSEMIDE 10 MG/ML IJ SOLN
80.0000 mg | Freq: Once | INTRAMUSCULAR | Status: AC
  Administered 2023-10-17: 80 mg via INTRAVENOUS
  Filled 2023-10-17: qty 8

## 2023-10-17 MED ORDER — PHENOL 1.4 % MT LIQD
1.0000 | OROMUCOSAL | Status: DC | PRN
  Filled 2023-10-17: qty 177

## 2023-10-17 MED ORDER — INSULIN ASPART 100 UNIT/ML IJ SOLN
0.0000 [IU] | Freq: Every day | INTRAMUSCULAR | Status: DC
Start: 2023-10-17 — End: 2023-10-31
  Administered 2023-10-17: 3 [IU] via SUBCUTANEOUS
  Administered 2023-10-18 – 2023-10-19 (×2): 5 [IU] via SUBCUTANEOUS
  Administered 2023-10-21 – 2023-10-24 (×3): 2 [IU] via SUBCUTANEOUS

## 2023-10-17 MED ORDER — PERFLUTREN LIPID MICROSPHERE
1.0000 mL | INTRAVENOUS | Status: AC | PRN
  Administered 2023-10-17: 3 mL via INTRAVENOUS

## 2023-10-17 MED ORDER — GUAIFENESIN-DM 100-10 MG/5ML PO SYRP
5.0000 mL | ORAL_SOLUTION | ORAL | Status: DC | PRN
  Administered 2023-10-17 – 2023-10-19 (×5): 5 mL via ORAL
  Filled 2023-10-17 (×5): qty 5

## 2023-10-17 MED ORDER — REVEFENACIN 175 MCG/3ML IN SOLN
175.0000 ug | Freq: Four times a day (QID) | RESPIRATORY_TRACT | Status: DC | PRN
  Administered 2023-10-20 – 2023-10-23 (×2): 175 ug via RESPIRATORY_TRACT
  Filled 2023-10-17: qty 3

## 2023-10-17 MED ORDER — INSULIN ASPART 100 UNIT/ML IJ SOLN
0.0000 [IU] | Freq: Three times a day (TID) | INTRAMUSCULAR | Status: DC
Start: 2023-10-17 — End: 2023-10-19
  Administered 2023-10-17 (×2): 5 [IU] via SUBCUTANEOUS
  Administered 2023-10-18 (×2): 3 [IU] via SUBCUTANEOUS
  Administered 2023-10-18: 11 [IU] via SUBCUTANEOUS
  Administered 2023-10-19: 8 [IU] via SUBCUTANEOUS

## 2023-10-17 NOTE — Evaluation (Signed)
Occupational Therapy Evaluation Patient Details Name: Alicia [name] MRN: [id number] DOB: 1956/07/12 Today's Date: 10/17/2023   History of Present Illness Pt is 67 yo presenting to [facility] ED on 11/23 due to worsening shortness of breath and fatigue and admitted for acute on chronic hypoxic respiratory failure secondary to CHF exacerbation. PMH: COPD, CHF, DM-2, diabetic retinopathy, SA node dysfunction, Hx CVA, CAD, and recent admissions for PNA and COVID-19 in 06/2023 and 07/2023.   Clinical Impression   At baseline, pt is Independent to Mod I with ADLs and IADLs and uses a RW for functional mobility. Pt's family also assists with IADLs as needed. Pt recently completed HHOT following hospitalization in 07/2023. Pt now presents with significantly decreased activity tolerance, current cardiopulmonary status affecting functional level, decreased balance during functional tasks, decreased B UE strength, and decreased safety and independence with functional tasks. Pt currently demonstrates ability to complete UB ADLs with Set up to Min assist, LB ADLs with Min assist +2, and functional sit<->stand transfers with Min assist +2 hand held assist. Pt with poor pleth but with SpO2 largely stable >/90% on 15L continuous O2 through HFNC throughout session. Pt participated well in session and is motivated to return to baseline PLOF. Pt will benefit from acute skilled OT services to address deficits outlined below and to increase safety and independence with ADLs, functional transfers, and functional mobility. Post acute discharge, pt will benefit from continued skilled OT services in the home to maximize rehab potential.        If plan is discharge home, recommend the following: Two people to help with walking and/or transfers;Two people to help with bathing/dressing/bathroom;Assistance with cooking/housework;Assist for transportation;Help with stairs or ramp for entrance    Functional Status Assessment  Patient has  had a recent decline in their functional status and demonstrates the ability to make significant improvements in function in a reasonable and predictable amount of time.  Equipment Recommendations  BSC/3in1    Recommendations for Other Services       Precautions / Restrictions Precautions Precautions: Fall Precaution Comments: watch O2, on 15L HFNC for session Restrictions Weight Bearing Restrictions: No      Mobility Bed Mobility Overal bed mobility: Needs Assistance Bed Mobility: Supine to Sit     Supine to sit: Min assist, Used rails, HOB elevated     General bed mobility comments: minA to manage trunk elevation, pt using bed rail and HOB elevated    Transfers Overall transfer level: Needs assistance Equipment used: 2 person hand held assist Transfers: Sit to/from Stand Sit to Stand: Min assist, +2 physical assistance           General transfer comment: minA of 2 but pt unable to maintain for >10 sec due to fatigue in LE. no change in SOB      Balance Overall balance assessment: Needs assistance Sitting-balance support: No upper extremity supported, Feet supported Sitting balance-Leahy Scale: Good     Standing balance support: Bilateral upper extremity supported, During functional activity Standing balance-Leahy Scale: Fair                             ADL either performed or assessed with clinical judgement   ADL Overall ADL's : Needs assistance/impaired Eating/Feeding: Set up;Sitting   Grooming: Set up;Sitting   Upper Body Bathing: Minimal assistance;Sitting   Lower Body Bathing: +2 for physical assistance;+2 for safety/equipment;Sitting/lateral leans;Sit to/from stand;Minimal assistance   Upper Body Dressing : Contact  guard assist;Sitting   Lower Body Dressing: Minimal assistance;+2 for physical assistance;+2 for safety/equipment;Sitting/lateral leans;Sit to/from stand   Toilet Transfer: Minimal assistance;+2 for physical assistance;+2  for safety/equipment;Stand-pivot;BSC/3in1 (+2 hand held assist) Toilet Transfer Details (indicate cue type and reason): simulated Toileting- Clothing Manipulation and Hygiene: Minimal assistance;+2 for physical assistance;+2 for safety/equipment;Sitting/lateral lean;Sit to/from stand         General ADL Comments: Pt with significantly decreased activity tolerance and current cardiopulmonary status affecting functional level. Pt requiring frequent rest breaks and use of pursed lip breathing during functional tasks to manage SOB on 15L continuous O2 through HFNC.     Vision Baseline Vision/History: 5 Retinopathy Ability to See in Adequate Light: 1 Impaired Patient Visual Report: No change from baseline       Perception         Praxis         Pertinent Vitals/Pain Pain Assessment Pain Assessment: No/denies pain     Extremity/Trunk Assessment Upper Extremity Assessment Upper Extremity Assessment: Right hand dominant;Generalized weakness   Lower Extremity Assessment Lower Extremity Assessment: Defer to PT evaluation   Cervical / Trunk Assessment Cervical / Trunk Assessment: Other exceptions;Kyphotic Cervical / Trunk Exceptions: large body habitus   Communication Communication Communication: No apparent difficulties Cueing Techniques: Verbal cues   Cognition Arousal: Alert Behavior During Therapy: WFL for tasks assessed/performed Overall Cognitive Status: Within Functional Limits for tasks assessed                                 General Comments: AAOx4 and pleasant throughout session. Able to follow multi-step instructions consistently.     General Comments  poor pleth but SpO2 seems stable >90% on 15L with sitting EOB. Pt reported questions related to prior conversation with MD regarding palliative care and hospice care. OT educated pt of goals and role of palliative care vs. hospice care and informed pt that the choice for either/both service is her  decision to make with support of family if desired. Pt verbalized understanding of education but may benefit from reinforcement of education.    Exercises     Shoulder Instructions      Home Living Family/patient expects to be discharged to:: Private residence Living Arrangements: Children (daughter) Available Help at Discharge: Family;Available PRN/intermittently Type of Home: House Home Access: Stairs to enter [employer] of Steps: 1, walls to hold onto Entrance Stairs-Rails: Right;Left Home Layout: Two level;Bed/bath upstairs Alternate Level Stairs-Number of Steps: 14 Alternate Level Stairs-Rails: Right;Left;Can reach both Bathroom Shower/Tub: Chief Strategy Officer: Standard Bathroom Accessibility: Yes How Accessible: Accessible via walker Home Equipment: Rollator (4 wheels);Cane - single point;Grab bars - tub/shower;Rolling Walker (2 wheels);Shower seat;Tub bench          Prior Functioning/Environment Prior Level of Function : Independent/Modified Independent             Mobility Comments: Pt reports she sometimes crawls up the stairs at home, uses RW, was receiving HHPT since last d/c with [facility] ADLs Comments: Independent ito Mod I with ADLs and IADLs; was receiving HHOT since last d/c with [facility] until about a week ago (still has HHPT)        OT Problem List: Decreased strength;Decreased activity tolerance;Impaired balance (sitting and/or standing);Cardiopulmonary status limiting activity      OT Treatment/Interventions: Self-care/ADL training;Therapeutic exercise;Energy conservation;DME and/or AE instruction;Therapeutic activities;Patient/family education    OT Goals(Current goals can be found in the care plan section)  Acute Rehab OT Goals Patient Stated Goal: to return home, get better, and continue participating in HHOT/PT OT Goal Formulation: With patient Time For Goal Achievement: 11/13/2023 Potential to Achieve Goals: Fair ADL  Goals Pt Will Perform Lower Body Bathing: with supervision;sit to/from stand;sitting/lateral leans (with adaptive equipment as needed) Pt Will Perform Lower Body Dressing: with supervision;sit to/from stand;sitting/lateral leans (with adaptive equipment as needed) Pt Will Transfer to Toilet: with modified independence;bedside commode;ambulating (with least restrictive AD) Pt Will Perform Toileting - Clothing Manipulation and hygiene: with supervision;sit to/from stand;sitting/lateral leans Pt/caregiver will Perform Home Exercise Program: Increased strength;Both right and left upper extremity;Independently;With written HEP provided (AROM progressing to theraband; Increased activity tolerance) Additional ADL Goal #1: Patient will demonstrate understanding of education in strategies for managing CHF and COPD through teach back with handouts provided.  OT Frequency: Min 1X/week    Co-evaluation PT/OT/SLP Co-Evaluation/Treatment: Yes Reason for Co-Treatment: Other (comment) (poor activity tolerance) PT goals addressed during session: Mobility/safety with mobility;Balance;Strengthening/ROM OT goals addressed during session: ADL's and self-care;Other (comment) (Increased activity tolerance)      AM-PAC OT "6 Clicks" Daily Activity     Outcome Measure Help from another person eating meals?: A Little Help from another person taking care of personal grooming?: A Little Help from another person toileting, which includes using toliet, bedpan, or urinal?: A Lot Help from another person bathing (including washing, rinsing, drying)?: A Lot Help from another person to put on and taking off regular upper body clothing?: A Little Help from another person to put on and taking off regular lower body clothing?: A Lot 6 Click Score: 15   End of Session Equipment Utilized During Treatment: Oxygen Nurse Communication: Mobility status  Activity Tolerance: Treatment limited secondary to medical complications  (Comment);Patient limited by fatigue (current cardiopulmonary status) Patient left: in bed;with call bell/phone within reach;Other (comment) (sitting EOB, with imaging staff entering room)  OT Visit Diagnosis: Other abnormalities of gait and mobility (R26.89);Unsteadiness on feet (R26.81);Muscle weakness (generalized) (M62.81);Other (comment) (decreased activity tolerance)                Time: 1610-9604 OT Time Calculation (min): 38 min Charges:  OT General Charges $OT Visit: 1 Visit OT Evaluation $OT Eval Moderate Complexity: 1 Mod OT Treatments $Self Care/Home Management : 8-22 mins  Britain Anagnos "Orson Eva., OTR/L, MA Acute Rehab [phone]   Lendon Colonel 10/17/2023, 5:34 PM

## 2023-10-17 NOTE — Progress Notes (Signed)
1150: Patient reports increase in appetite. Ready to take Bipap off to eat lunch- Patient on 13L HFNC- SpO2 88%. BUE cool- Spo2 monitor switch to Left ear lobe- 15L HFNC SpO2 96%.  1250: Patient ate 100% of food. SpO2 100% on 15L HFNC.

## 2023-10-17 NOTE — Progress Notes (Signed)
HD#1 SUBJECTIVE:  Patient Summary: Alicia [name] is a 67 y.o. 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   Overnight Events: Reports she only worse her BiPAP for few hours, and took it off at 1 Am; switched to HFNC   Interim History: Pt evaluated at bedside, on HFNC 13L, with increase work of breathing, reports that her breathing has somewhat improved compared to yesterday in the ED.  She reports nonproductive cough, feels chest congestion.  Denies any abdominal pain, nausea, vomiting.  Reports that she only wore her BiPAP for couple of hours last night, took it off at 1 AM, she switched to high flow nasal cannula.  She reports that she is urinating with taking lasix denies any burning sensation.   OBJECTIVE:  Vital Signs: Vitals:   10/17/23 0125 10/17/23 0450 10/17/23 0742 10/17/23 0751  BP: 134/63 133/66 (!) 159/66   Pulse:  (!) 56 (!) 54   Resp:   (!) 22 (!) 24  Temp: 98.3 F (36.8 C) 98.5 F (36.9 C) (!) 97.5 F (36.4 C)   TempSrc: Oral Oral Axillary   SpO2:   93%   Weight:  101.4 kg    Height:       Supplemental O2: HFNC SpO2: 93 % O2 Flow Rate (L/min): (S) 13 L/min FiO2 (%): 60 %  Filed Weights   10/16/23 1339 10/16/23 1757 10/17/23 0450  Weight: 98.2 kg 107.1 kg 101.4 kg     Intake/Output Summary (Last 24 hours) at 10/17/2023 0934 Last data filed at 10/17/2023 2440 Gross per 24 hour  Intake 100 ml  Output 450 ml  Net -350 ml   Net IO Since Admission: -350 mL [10/17/23 0934]  Physical Exam: Physical Exam  Constitutional: appears ill,  sitting up in bed, on HFNC 13L  HENT: normocephalic atraumatic, mucous membranes moist Cardiovascular: regular rate, LE edema bilaterally has resolved  Pulmonary/Chest: Increase work of breathing, +rales bilateral lung fields and crackles  Abdominal: soft, non-tender, non-distended, bowel sounds present  MSK: normal bulk and tone Neurological: alert & oriented x 3.  Skin: warm and dry   Patient Lines/Drains/Airways Status     Active Line/Drains/Airways     Name Placement date Placement time Site Days   Peripheral IV 10/16/23 20 G Right Antecubital 10/16/23  1506  Antecubital  1   External Urinary Catheter 10/16/23  1858  --  1             ASSESSMENT/PLAN:  Assessment: Active Problems:   Type 2 diabetes mellitus with moderate nonproliferative diabetic retinopathy (HCC)   Essential hypertension   COPD (chronic obstructive pulmonary disease) (HCC)   Hyperlipidemia   Gastroesophageal reflux disease   Obesity (BMI 30.0-34.9)   Nocturnal hypoxemia   AKI (acute kidney injury) (HCC)   Acute on chronic heart failure with preserved ejection fraction (HFpEF) (HCC)  Alicia [name] is a 67 y.o. 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  Plan:  #Acute on Chronic Hypoxic Respiratory Failure #Acute on Chronic HF Exacerbation Presented with worsening dyspnea and fatigue for 1 week with increasing oxygen requirement today. Initially placed on NRB, was saturating at 69%, then placed on BiPAP in ED. CXR showed mild pulmonary vascular congestion, with small  bilateral layering pleural effusions. BNP >2000; VBG shows pH 7.3, bicarb 39.2, pCO2 73.2; lung exam notable for bilateral rales and bibasilar crackles. She also had 1+ pitting edema below mid-shin bilaterally. She was given one dose of IV lasix 40 mg in ED; she responded well with improved breathing. She is given 80 mg lasix last night, with total I&Os -450. Received another dose lasix 80 mg this morning. She is receiving breathing treatments; will get VBG to assess for CO2 retention that could be playing a role in her hyperventilation as she did not wear her BiPAP throughout the whole night.    Plan:  - Follow up on Echo  - Follow up on VBG - Lasix 80 mg at 1600  - Continue Yupelri nebulizer treatment and Atrovent nebulizer treatment every 4-6 h prn for shortness of breath and wheezing. - BiPAP at bedtime - Daily weight checks with Strict Is & Os - PT/OT eval   #AKI #Urinary hesitancy/urgency  Presented with Cr 1.13, elevated from 0.75 on 09/03/2023 likely pre-renal, in the setting of HF exacerbation. Cr continues to trend higher in the setting of high dose lasix; She also had concerns of straining while urinating in the ED. UA negative infection. She denies any dysuria today, reports she is urinating well with high dose of lasix. Will follow up on bladder scan to evaluate for any urinary retention.  - Will trend Cr  - Follow up bladder scan    #Chest pain, noncardiac  #Elevated Troponin #Demand Ischemia  Reported chest pain for the past 4 days, muscular like that comes and goes, worsened with movement. Pain is reproducible upon palpation. Troponin elevated to 19, 24, low suspicion for ACS at this time. Likely demand ischemia in the setting of volume overload.  - Tylenol as needed for pain     #Hypomagnesemia  Presented with Mg 1.5, repleted with goal above 2.0 - Recheck BMP   Chronic Conditions:  HX of COPD: Lung exam negative for wheezing; continue Brovana and Atrovent. Type 2  Diabetes with nonproliferative diabetic retinopathy:  Semglee 20 units qhs and SSI.  HLD: Resumed lipitor 80 mg daily and zetia 10 mg daily Paroxysmal A fib: Currently NSR, resumed Eliquis 5 mg BID HTN: Resumed home medication amlodipine 10 mg daily,   Best Practice: Diet: Cardiac diet IVF: Fluids: None , Rate: None VTE: Eliquis 5 mg BID  Code: Full AB: None  Therapy Recs:  pending , DME: none and other pending Family Contact: Daughter, called and notified. DISPO: Anticipated discharge  2-4 days  to  pending eval  pending  medical management  .  Signature: Jeral Pinch, D.O.  Internal Medicine Resident, PGY-1 [facility]  Pager: [phone] 9:34 AM, 10/17/2023   Please contact the on call pager after 5 pm and on weekends at [phone].

## 2023-10-17 NOTE — Plan of Care (Signed)
  Problem: Education: Goal: Knowledge of General Education information will improve Description: Including pain rating scale, medication(s)/side effects and non-pharmacologic comfort measures Outcome: Progressing   Problem: Health Behavior/Discharge Planning: Goal: Ability to manage health-related needs will improve Outcome: Progressing   Problem: Clinical Measurements: Goal: Will remain free from infection Outcome: Progressing Goal: Cardiovascular complication will be avoided Outcome: Progressing   Problem: Nutrition: Goal: Adequate nutrition will be maintained Outcome: Progressing   Problem: Coping: Goal: Level of anxiety will decrease Outcome: Progressing   Problem: Elimination: Goal: Will not experience complications related to bowel motility Outcome: Progressing Goal: Will not experience complications related to urinary retention Outcome: Progressing   Problem: Pain Management: Goal: General experience of comfort will improve Outcome: Progressing   Problem: Clinical Measurements: Goal: Ability to maintain clinical measurements within normal limits will improve Outcome: Not Progressing Goal: Diagnostic test results will improve Outcome: Not Progressing Goal: Respiratory complications will improve Outcome: Not Progressing   Problem: Activity: Goal: Risk for activity intolerance will decrease Outcome: Not Progressing

## 2023-10-17 NOTE — Progress Notes (Signed)
*  PRELIMINARY RESULTS* Echocardiogram 2D Echocardiogram has been performed with Definity.  Stacey Drain 10/17/2023, 3:53 PM

## 2023-10-17 NOTE — Plan of Care (Signed)
  Problem: Education: Goal: Ability to describe self-care measures that may prevent or decrease complications (Diabetes Survival Skills Education) will improve Outcome: Progressing Goal: Individualized Educational Video(s) Outcome: Progressing   Problem: Coping: Goal: Ability to adjust to condition or change in health will improve Outcome: Progressing   Problem: Nutritional: Goal: Maintenance of adequate nutrition will improve Outcome: Progressing Goal: Progress toward achieving an optimal weight will improve Outcome: Progressing   Problem: Nutrition: Goal: Adequate nutrition will be maintained Outcome: Progressing   Problem: Elimination: Goal: Will not experience complications related to bowel motility Outcome: Progressing Goal: Will not experience complications related to urinary retention Outcome: Progressing   Problem: Pain Management: Goal: General experience of comfort will improve Outcome: Progressing   Problem: Safety: Goal: Ability to remain free from injury will improve Outcome: Progressing   Problem: Skin Integrity: Goal: Risk for impaired skin integrity will decrease Outcome: Progressing

## 2023-10-17 NOTE — Progress Notes (Signed)
MD informed of VBG pCO2 of 74- collected while patient was on HFNC.

## 2023-10-17 NOTE — Progress Notes (Signed)
PT Cancellation Note  Patient Details Name: Alicia [name] MRN: [id number] DOB: 1956-10-22   Cancelled Treatment:    Reason Eval/Treat Not Completed: Other (comment) Discussed with RN this morning, will hold on PT evaluation at this time due to poor respiratory stability and need for increased O2 this morning and pt being placed back on BiPAP. Will plan to return later as time/schedule allows for evaluation.   Vickki Muff, PT, DPT   Acute Rehabilitation Department Office [phone] Secure Chat Communication Preferred   [name] 10/17/2023, 11:42 AM

## 2023-10-17 NOTE — Evaluation (Signed)
Physical Therapy Evaluation Patient Details Name: Alicia [name] MRN: [id number] DOB: May 31, 1956 Today's Date: 10/17/2023  History of Present Illness  Pt is 67 yo presenting to [facility] ED on 11/23 due to worsening shortness of breath and fatigue and admitted for acute on chronic hypoxic respiratory failure secondary to CHF exacerbation. PMH: COPD, CHF, DM-2, diabetic retinopathy, SA node dysfunction, Hx CVA, CAD, and recent admissions for PNA and COVID-19 in 06/2023 and 07/2023.   Clinical Impression  Pt in bed upon arrival of PT, agreeable to evaluation at this time. Prior to admission the pt was independent with mobility in her home, reports she was active with HHPT and OT, was able to walk in the home and manage the 15 stairs to her bedroom without physical assist. The pt presents today with poor activity tolerance, limited muscular endurance, and poor standing balance. The pt was SOB with conversation at times, but was able to stand at [facility] with HHA and reports limiting factor was fatigue in her legs. The pt will continue to benefit from skilled PT acutely to progress functional activity tolerance and resume HHPT when medically stable for d/c home.       If plan is discharge home, recommend the following: A lot of help with walking and/or transfers;A little help with bathing/dressing/bathroom;Help with stairs or ramp for entrance   Can travel by private vehicle        Equipment Recommendations None recommended by PT  Recommendations for Other Services       Functional Status Assessment Patient has had a recent decline in their functional status and demonstrates the ability to make significant improvements in function in a reasonable and predictable amount of time.     Precautions / Restrictions Precautions Precautions: Fall Precaution Comments: watch O2, on 15L HFNC for session Restrictions Weight Bearing Restrictions: No      Mobility  Bed Mobility Overal bed mobility: Needs  Assistance Bed Mobility: Supine to Sit     Supine to sit: Min assist, Used rails, HOB elevated     General bed mobility comments: minA to manage trunk elevation, pt using bed rail and HOB elevated    Transfers Overall transfer level: Needs assistance Equipment used: 2 person hand held assist Transfers: Sit to/from Stand Sit to Stand: Min assist, +2 physical assistance           General transfer comment: minA of 2 but pt unable to maintain for >10 sec due to fatigue in LE. no change in SOB    Ambulation/Gait               General Gait Details: pt declined due to fatigue     Balance Overall balance assessment: Needs assistance Sitting-balance support: No upper extremity supported, Feet supported Sitting balance-Leahy Scale: Good     Standing balance support: Bilateral upper extremity supported, During functional activity Standing balance-Leahy Scale: Fair                               Pertinent Vitals/Pain Pain Assessment Pain Assessment: No/denies pain    Home Living Family/patient expects to be discharged to:: Private residence Living Arrangements: Children (daughter) Available Help at Discharge: Family;Available PRN/intermittently Type of Home: House Home Access: Stairs to enter Entrance Stairs-Rails: Right;Left Entrance Stairs-Number of Steps: 1, walls to hold onto Alternate Level Stairs-Number of Steps: 14 Home Layout: Two level;Bed/bath upstairs Home Equipment: Rollator (4 wheels);Cane - single point;Grab bars - tub/shower;Rolling Environmental consultant (  2 wheels);Shower seat;Tub bench      Prior Function Prior Level of Function : Independent/Modified Independent             Mobility Comments: Pt reports she sometimes crawls up the stairs at home, uses RW, was receiving HHPT since last d/c with [facility] ADLs Comments: Independent ito Mod I with ADLs and IADLs     Extremity/Trunk Assessment   Upper Extremity Assessment Upper Extremity  Assessment: Defer to OT evaluation    Lower Extremity Assessment Lower Extremity Assessment: Generalized weakness (poor power and endurance, grossly 4-/5 to MMT. distal pitting edema bilaterally)    Cervical / Trunk Assessment Cervical / Trunk Assessment: Other exceptions;Kyphotic Cervical / Trunk Exceptions: large body habitus  Communication   Communication Communication: No apparent difficulties Cueing Techniques: Verbal cues  Cognition Arousal: Alert Behavior During Therapy: WFL for tasks assessed/performed Overall Cognitive Status: Within Functional Limits for tasks assessed                                          General Comments General comments (skin integrity, edema, etc.): poor pleth but SpO2 seems stable >90% on 15L with sitting EOB        Assessment/Plan    PT Assessment Patient needs continued PT services  PT Problem List Cardiopulmonary status limiting activity;Decreased activity tolerance;Decreased mobility;Decreased balance       PT Treatment Interventions DME instruction;Gait training;Functional mobility training;Therapeutic activities;Therapeutic exercise;Balance training;Patient/family education;Stair training    PT Goals (Current goals can be found in the Care Plan section)  Acute Rehab PT Goals Patient Stated Goal: to return home PT Goal Formulation: With patient Time For Goal Achievement: 11/19/2023 Potential to Achieve Goals: Fair    Frequency Min 1X/week     Co-evaluation PT/OT/SLP Co-Evaluation/Treatment: Yes Reason for Co-Treatment: Other (comment);To address functional/ADL transfers (poor activity tolerance) PT goals addressed during session: Mobility/safety with mobility;Balance;Strengthening/ROM         AM-PAC PT "6 Clicks" Mobility  Outcome Measure Help needed turning from your back to your side while in a flat bed without using bedrails?: A Little Help needed moving from lying on your back to sitting on the side of  a flat bed without using bedrails?: A Little Help needed moving to and from a bed to a chair (including a wheelchair)?: A Little Help needed standing up from a chair using your arms (e.g., wheelchair or bedside chair)?: A Lot Help needed to walk in hospital room?: Total Help needed climbing 3-5 steps with a railing? : Total 6 Click Score: 13    End of Session Equipment Utilized During Treatment: Gait belt;Oxygen Activity Tolerance: Patient limited by fatigue Patient left: in bed;with call bell/phone within reach;with bed alarm set (sitting EOB) Nurse Communication: Mobility status PT Visit Diagnosis: Other abnormalities of gait and mobility (R26.89);Muscle weakness (generalized) (M62.81)    Time: 0981-1914 PT Time Calculation (min) (ACUTE ONLY): 38 min   Charges:   PT Evaluation $PT Eval Moderate Complexity: 1 Mod PT Treatments $Therapeutic Activity: 8-22 mins PT General Charges $$ ACUTE PT VISIT: 1 Visit         Vickki Muff, PT, DPT   Acute Rehabilitation Department Office [phone] Secure Chat Communication Preferred  [name] 10/17/2023, 4:49 PM

## 2023-10-17 NOTE — Progress Notes (Signed)
Patient sitting on EOB of bed. Reports feeling better. Echo to be performed at bedside at this time.

## 2023-10-17 NOTE — Progress Notes (Signed)
SpO2 in the lower 80s (80-86) on 15HFNC. Patient placed back on Bipap at this time.

## 2023-10-18 DIAGNOSIS — J441 Chronic obstructive pulmonary disease with (acute) exacerbation: Secondary | ICD-10-CM | POA: Diagnosis not present

## 2023-10-18 DIAGNOSIS — I509 Heart failure, unspecified: Secondary | ICD-10-CM | POA: Diagnosis not present

## 2023-10-18 DIAGNOSIS — J9621 Acute and chronic respiratory failure with hypoxia: Secondary | ICD-10-CM | POA: Diagnosis not present

## 2023-10-18 LAB — URINE CULTURE: Culture: 40000 — AB

## 2023-10-18 LAB — GLUCOSE, CAPILLARY
Glucose-Capillary: 176 mg/dL — ABNORMAL HIGH (ref 70–99)
Glucose-Capillary: 170 mg/dL — ABNORMAL HIGH (ref 70–99)
Glucose-Capillary: 305 mg/dL — ABNORMAL HIGH (ref 70–99)
Glucose-Capillary: 390 mg/dL — ABNORMAL HIGH (ref 70–99)

## 2023-10-18 LAB — CBC
HCT: 34 % — ABNORMAL LOW (ref 36.0–46.0)
Hemoglobin: 9.8 g/dL — ABNORMAL LOW (ref 12.0–15.0)
MCH: 25.3 pg — ABNORMAL LOW (ref 26.0–34.0)
MCHC: 28.8 g/dL — ABNORMAL LOW (ref 30.0–36.0)
MCV: 87.9 fL (ref 80.0–100.0)
Platelets: 267 10*3/uL (ref 150–400)
RBC: 3.87 MIL/uL (ref 3.87–5.11)
RDW: 17.6 % — ABNORMAL HIGH (ref 11.5–15.5)
WBC: 7.2 10*3/uL (ref 4.0–10.5)
nRBC: 0.3 % — ABNORMAL HIGH (ref 0.0–0.2)

## 2023-10-18 LAB — BLOOD GAS, VENOUS
Acid-Base Excess: 11.3 mmol/L — ABNORMAL HIGH (ref 0.0–2.0)
Bicarbonate: 40.6 mmol/L — ABNORMAL HIGH (ref 20.0–28.0)
Drawn by: 600761
O2 Saturation: 60 %
Patient temperature: 36.6
pCO2, Ven: 76 mm[Hg] (ref 44–60)
pH, Ven: 7.34 (ref 7.25–7.43)
pO2, Ven: 36 mm[Hg] (ref 32–45)

## 2023-10-18 LAB — BASIC METABOLIC PANEL
Anion gap: 8 (ref 5–15)
BUN: 35 mg/dL — ABNORMAL HIGH (ref 8–23)
CO2: 33 mmol/L — ABNORMAL HIGH (ref 22–32)
Calcium: 8.7 mg/dL — ABNORMAL LOW (ref 8.9–10.3)
Chloride: 94 mmol/L — ABNORMAL LOW (ref 98–111)
Creatinine, Ser: 1.44 mg/dL — ABNORMAL HIGH (ref 0.44–1.00)
GFR, Estimated: 40 mL/min — ABNORMAL LOW (ref >60)
Glucose, Bld: 188 mg/dL — ABNORMAL HIGH (ref 70–99)
Potassium: 4.4 mmol/L (ref 3.5–5.1)
Sodium: 135 mmol/L (ref 135–145)

## 2023-10-18 LAB — MAGNESIUM: Magnesium: 2.3 mg/dL (ref 1.7–2.4)

## 2023-10-18 MED ORDER — INSULIN GLARGINE-YFGN 100 UNIT/ML ~~LOC~~ SOLN
30.0000 [IU] | Freq: Every day | SUBCUTANEOUS | Status: DC
  Administered 2023-10-18: 30 [IU] via SUBCUTANEOUS
  Filled 2023-10-18 (×2): qty 0.3

## 2023-10-18 MED ORDER — BUDESONIDE 0.25 MG/2ML IN SUSP
0.2500 mg | Freq: Two times a day (BID) | RESPIRATORY_TRACT | Status: DC
  Administered 2023-10-18 – 2023-10-24 (×13): 0.25 mg via RESPIRATORY_TRACT
  Filled 2023-10-18 (×13): qty 2

## 2023-10-18 MED ORDER — SODIUM CHLORIDE 0.9 % IV SOLN
500.0000 mg | Freq: Every day | INTRAVENOUS | Status: DC
  Administered 2023-10-18: 500 mg via INTRAVENOUS
  Filled 2023-10-18: qty 5

## 2023-10-18 MED ORDER — PREDNISONE 20 MG PO TABS
40.0000 mg | ORAL_TABLET | Freq: Every day | ORAL | Status: DC
  Administered 2023-10-19: 40 mg via ORAL
  Filled 2023-10-18: qty 2

## 2023-10-18 MED ORDER — METHYLPREDNISOLONE SODIUM SUCC 125 MG IJ SOLR
125.0000 mg | Freq: Once | INTRAMUSCULAR | Status: AC
  Administered 2023-10-18: 125 mg via INTRAVENOUS
  Filled 2023-10-18: qty 2

## 2023-10-18 NOTE — Plan of Care (Signed)
  Problem: Education: Goal: Ability to describe self-care measures that may prevent or decrease complications (Diabetes Survival Skills Education) will improve Outcome: Progressing   Problem: Health Behavior/Discharge Planning: Goal: Ability to identify and utilize available resources and services will improve Outcome: Progressing Goal: Ability to manage health-related needs will improve Outcome: Progressing   Problem: Metabolic: Goal: Ability to maintain appropriate glucose levels will improve Outcome: Progressing   Problem: Clinical Measurements: Goal: Will remain free from infection Outcome: Progressing Goal: Diagnostic test results will improve Outcome: Progressing Goal: Respiratory complications will improve Outcome: Progressing

## 2023-10-18 NOTE — TOC Initial Note (Signed)
Transition of Care [facility]) - Initial/Assessment Note    Patient Details  Name: Alicia [name] MRN: [id number] Date of Birth: 10/10/1956  Transition of Care [facility] CM/SW Contact:    Leone Haven, RN Phone Number: 10/18/2023, 6:00 PM  Clinical Narrative:                 From home with daughter, Alicia [name] has PCP and insurance on file, states she has HHPT with [facility] and would like to continue.  She has home oxygen 8 liters with [employer], walker, cane and rollator.  States daughter or son  will transport her home at [employer] and daughter and son  is support system, [facility] [address]  Pta ambulatory with walker.  Per pt eval rec HHPT/HHOT, will add HHOT to [facility] services. Will need orders.   Expected Discharge Plan: Home w Home Health Services Barriers to Discharge: Continued Medical Work up   Patient Goals and CMS Choice Patient states their goals for this hospitalization and ongoing recovery are:: return home          Expected Discharge Plan and Services In-house Referral: NA Discharge Planning Services: CM Consult Post Acute Care Choice: Resumption of Svcs/PTA Provider, Home Health Living arrangements for the past 2 months: Single Family Home                 DME Arranged: N/A DME Agency: NA       HH Arranged: PT, OT HH AgencyHotel manager Home Health Care Date [facility] Agency Contacted: 10/18/23 Time HH Agency Contacted: 1759 Representative spoke with at [facility] Agency: Kandee Keen  Prior Living Arrangements/Services Living arrangements for the past 2 months: Single Family Home Lives with:: Adult Children (daughter) Patient language and need for interpreter reviewed:: Yes Do you feel safe going back to the place where you live?: Yes      Need for Family Participation in Patient Care: Yes (Comment) Care giver support system in place?: Yes (comment) Current home services: DME (home oxygen 8 liters, [employer], walker, cane, rollator, HHPT with [facility]) Criminal  Activity/Legal Involvement Pertinent to Current Situation/Hospitalization: No - Comment as needed  Activities of Daily Living   ADL Screening (condition at time of admission) Is the patient deaf or have difficulty hearing?: No Does the patient have difficulty seeing, even when wearing glasses/contacts?: No Does the patient have difficulty concentrating, remembering, or making decisions?: No  Permission Sought/Granted Permission sought to share information with : Case Manager Permission granted to share information with : Yes, Verbal Permission Granted     Permission granted to share info w AGENCY: HH        Emotional Assessment Appearance:: Appears stated age Attitude/Demeanor/Rapport: Engaged Affect (typically observed): Appropriate Orientation: : Oriented to Self, Oriented to Place, Oriented to  Time, Oriented to Situation Alcohol / Substance Use: Not Applicable Psych Involvement: No (comment)  Admission diagnosis:  Acute on chronic respiratory failure with hypoxia (HCC) [J96.21] Acute congestive heart failure, unspecified heart failure type (HCC) [I50.9] Acute on chronic heart failure with preserved ejection fraction (HFpEF) (HCC) [I50.33] Patient Active Problem List   Diagnosis Date Noted   Acute on chronic heart failure with preserved ejection fraction (HFpEF) (HCC) 10/16/2023   Acute congestive heart failure (HCC) 08/06/2023   Acute on chronic respiratory failure with hypoxia (HCC) 07/21/2023   Paroxysmal atrial fibrillation (HCC) 07/08/2023   Heart failure with recovered ejection fraction (HFrecEF) (HCC) 06/15/2023   AKI (acute kidney injury) (HCC) 02/16/2023   Encounter for screening  for lung cancer 05/13/2020   Nocturnal hypoxemia 12/30/2018   Sinoatrial node dysfunction (HCC) 10/17/2018   Dysuria 10/14/2018   Chronic respiratory failure with hypoxia, on home oxygen therapy (HCC) 10/14/2018   Aortic atherosclerosis (HCC) 09/24/2017   Gastroesophageal reflux disease  09/24/2017   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 09/24/2017   Seasonal allergic rhinitis due to pollen 09/24/2017   Healthcare maintenance 09/24/2017   Obesity (BMI 30.0-34.9) 09/24/2017   Hyperlipidemia    Uterine prolapse 02/12/2016   Coronary artery disease involving native coronary artery of native heart without angina pectoris 11/09/2014   COPD (chronic obstructive pulmonary disease) (HCC) 02/18/2014   Type 2 diabetes mellitus with moderate nonproliferative diabetic retinopathy (HCC) 10/15/2013   Essential hypertension 10/15/2013   PCP:  [name], MD Pharmacy:   [id number] [place], [place] [facility] [address] [place] [address] Phone: [phone] Fax: [phone]  [facility] [address] [place] [address] Phone: [phone] Fax: [phone]     Social Determinants of Health (SDOH) Social History: SDOH Screenings   Food Insecurity: No Food Insecurity (10/16/2023)  Housing: Patient Unable To Answer (10/16/2023)  Transportation Needs: Unmet Transportation Needs (10/16/2023)  Utilities: Not At Risk (10/16/2023)  Alcohol Screen: Low Risk  (10/18/2023)  Depression (PHQ2-9): Low Risk  (09/06/2023)  Financial Resource Strain: Low Risk  (10/18/2023)  Physical Activity: Inactive (05/19/2023)  Social Connections: Socially Isolated (05/19/2023)  Stress: No Stress Concern Present (05/19/2023)  Tobacco Use: Medium Risk (10/16/2023)   SDOH Interventions: Alcohol Usage Interventions: Intervention Not Indicated (Score <7) Financial Strain Interventions: Intervention Not Indicated   Readmission Risk Interventions    08/10/2023   12:29 PM 07/28/2023   11:44 AM  Readmission Risk Prevention Plan  Transportation Screening Complete Complete  Medication Review Oceanographer) Complete Complete  PCP or Specialist appointment within 3-5 days of discharge  Complete   HRI or Home Care Consult Complete Complete  SW Recovery Care/Counseling Consult  Complete  Palliative Care Screening Not Applicable Not Applicable  Skilled Nursing Facility Not Applicable Not Applicable

## 2023-10-18 NOTE — Plan of Care (Signed)
  Problem: Clinical Measurements: Goal: Will remain free from infection Outcome: Progressing Goal: Diagnostic test results will improve Outcome: Progressing Goal: Cardiovascular complication will be avoided Outcome: Progressing   Problem: Elimination: Goal: Will not experience complications related to bowel motility Outcome: Progressing Goal: Will not experience complications related to urinary retention Outcome: Progressing   Problem: Pain Management: Goal: General experience of comfort will improve Outcome: Progressing   Problem: Safety: Goal: Ability to remain free from injury will improve Outcome: Progressing   Problem: Skin Integrity: Goal: Risk for impaired skin integrity will decrease Outcome: Progressing   Problem: Education: Goal: Knowledge of General Education information will improve Description: Including pain rating scale, medication(s)/side effects and non-pharmacologic comfort measures Outcome: Not Progressing   Problem: Health Behavior/Discharge Planning: Goal: Ability to manage health-related needs will improve Outcome: Not Progressing   Problem: Clinical Measurements: Goal: Ability to maintain clinical measurements within normal limits will improve Outcome: Not Progressing Goal: Respiratory complications will improve Outcome: Not Progressing   Problem: Activity: Goal: Risk for activity intolerance will decrease Outcome: Not Progressing   Problem: Nutrition: Goal: Adequate nutrition will be maintained Outcome: Not Progressing   Problem: Coping: Goal: Level of anxiety will decrease Outcome: Not Progressing

## 2023-10-18 NOTE — Progress Notes (Signed)
HD#2 SUBJECTIVE:  Patient Summary:  Alicia [name] is a 67 y.o. 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   Overnight Events: Had a scare last night when she woke up, had the cords coiled around her neck, became upset, but resolved shortly after.   Interim History: She is evaluated bedside, reports that her breathing is not better.  She is getting short of breath, has a productive cough.  No fever or chills. She is currently placed on BiPAP.   OBJECTIVE:  Vital Signs: Vitals:   10/17/23 1657 10/17/23 1926 10/18/23 0011 10/18/23 0614  BP: (!) 145/73 (!) 151/60 (!) 151/61 (!) 156/61  Pulse: (!) 59 (!) 55 (!) 53 (!) 56  Resp:  20 20 20   Temp: 98.2 F (36.8 C) 97.8 F (36.6 C) 98.2 F (36.8 C) 97.6 F (36.4 C)  TempSrc: Oral Oral Oral Oral  SpO2: 96% 95% 100% 93%  Weight:   103.5 kg   Height:       Supplemental O2: HFNC SpO2: 93 % O2 Flow Rate (L/min): 15 L/min FiO2 (%): 60 %  Filed Weights   10/16/23 1757 10/17/23 0450 10/18/23 0011  Weight: 107.1 kg 101.4 kg 103.5 kg     Intake/Output Summary (Last 24 hours) at 10/18/2023 0725 Last data filed at 10/18/2023 6962 Gross per 24 hour  Intake 340 ml  Output 800 ml  Net -460 ml   Net IO Since Admission: -910 mL [10/18/23 0725]  Physical Exam: Physical Exam General: appears in distress Head: Normocephalic, atraumatic, on BiPAP Cardio: Regular rate and rhythm, no murmurs, rubs or gallops. 1+ pitting edema bilateral distal LE  Pulmonary: +crackles and diminished breath sounds, increase work of  breathing  Abdomen: Soft, nontender with normoactive bowel sounds with no rebound or guarding   Neuro: Alert and oriented x3, no focal deficits   Patient Lines/Drains/Airways Status     Active Line/Drains/Airways     Name Placement date Placement time Site Days   Peripheral IV 10/16/23 20 G Right Antecubital 10/16/23  1506  Antecubital  2   External Urinary Catheter 10/16/23  1858  --  2             ASSESSMENT/PLAN:  Assessment: Active Problems:   Type 2 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   Plan:   #Acute on Chronic Hypoxic Respiratory Failure #Acute on Chronic HF Exacerbation #Acute COPD exacerbation  Presented with worsening dyspnea and fatigue for 1 week with increasing oxygen requirement today. Initially placed on NRB, was saturating at 69%, then placed on BiPAP in ED. CXR showed mild pulmonary vascular congestion, with small bilateral layering pleural effusions. BNP >2000; VBG shows pH 7.3, bicarb 39.2, pCO2 73.2; lung exam notable  for bilateral rales and bibasilar crackles. She also had 1+ pitting edema below mid-shin bilaterally. She was given one dose of IV lasix 40 mg in ED; she responded well with improved breathing. She has received total 3 doses of IV Lasix 80 mg since admission, with total of -910 urine output. Echo shows EF 50-55%, unchanged from previous 07/2023. Per lung exam today, she has diffuse crackles and diminished breath sounds notable anteriorly, concerned for COPD exacerbation. She is given one IV dose of methylprednisolone and started on IV Zithromax 3 days.   Plan:  - Continue Yupelri nebulizer treatment and Atrovent nebulizer treatment  - Continue Budesonide  - START prednisone 40 mg tomorrow for 5 days - Continue IV azithromycin 500 mg IV for 3 days - BiPAP at bedtime - Daily weight checks with Strict Is & Os - PT/OT recommended Home health PT/OT    #AKI #Urinary hesitancy/urgency  Presented with Cr 1.13, elevated from 0.75 on 09/03/2023 likely pre-renal, in the setting of HF exacerbation. Cr continues to trend higher in the setting of high dose lasix; She also had concerns of straining while urinating in the ED. UA negative infection. She denies any dysuria today, reports she is urinating well with high dose of lasix. Bladder scan on 11/24 showed 0 PVR .  - Will currently hold anymore lasix    #Chest pain, noncardiac  #Elevated Troponin #Demand Ischemia  Reported chest pain for the past 4 days, muscular like that comes and goes, worsened with movement. Pain is reproducible upon palpation. Troponin elevated to 19, 24, low suspicion for ACS at this time. Likely demand ischemia in the setting of volume overload.  - Tylenol as needed for pain     #Hypomagnesemia  Improved.    Chronic Conditions:  Type 2 Diabetes with nonproliferative diabetic retinopathy:  Semglee 20 units qhs and SSI.  HLD: Resumed lipitor 80 mg daily and zetia 10 mg daily Paroxysmal A fib: Currently NSR, resumed Eliquis 5 mg  BID HTN: Resumed home medication amlodipine 10 mg daily.   Best Practice: Diet: Cardiac diet IVF: Fluids: none, Rate: None VTE: Eliquis 5 mg twice daily Code: Full AB: IV Zithromax day 1 out of 3 Therapy Recs:  Home health PT/OT , DME: none Family Contact: , at bedside. DISPO: Anticipated discharge  2-3 days  to  Home  pending  medical management .  Signature: Jeral Pinch, D.O.  Internal Medicine Resident, PGY-1 [facility]  Pager: [phone] 7:25 AM, 10/18/2023   Please contact the on call pager after 5 pm and on weekends at [phone].

## 2023-10-18 NOTE — Progress Notes (Signed)
   10/18/23 2220  Vent Select  Invasive or Noninvasive Noninvasive  Adult Vent Y  Adult Ventilator Settings  Vent Type Servo i  Vent Mode PCV;BIPAP  Set Rate 14 bmp  FiO2 (%) 60 %  IPAP 20 cmH20  EPAP 5 cmH20  Adult Ventilator Measurements  Peak Airway Pressure 20 L/min  Mean Airway Pressure 10 cmH20  Resp Rate Spontaneous 12 br/min  Resp Rate Total 24 br/min  Exhaled Vt 451 mL  Measured Ve 10.2 L  Adult Ventilator Alarms  Alarms On Y  VAP Prevention  HOB> 30 Degrees Y  Equipment wiped down Yes   Pt comfrotably sleeping with bipap on.  RT will continue to monitor

## 2023-10-18 NOTE — Progress Notes (Signed)
Heart Failure Nurse Navigator Progress Note  PCP: [name], MD PCP-Cardiologist: Anne Fu Admission Diagnosis: Acute on chronic respiratory failure with hypoxia, Acute congestive heart failure.  Admitted from: Home via EMS  Presentation:   Alicia [name] presented with shortness of breath and chest pain x 1 week, BLE edema +1 , was in respiratory distress upon EMS arrival placed on 8 L  then switched to NRB then BiPAP. BP 143/67, HR 55, BNP 2554,  CXR with mild pulmonary edema, congestive heart failure. .   Patient whom was last seen in [facility] 07/14/23 was re-educated on the sign and symptoms of heart failure, daily weights, when to call her doctor, or come into the ED, diet/ fluid restrictions, taking all her medications and attending her doctors appointments, patient verbalized her understanding, a HF TOC follow up appointment was scheduled for 11/08/2023 @ 8:30 am.   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  Non-functioning   Aortic atherosclerosis (HCC) 09/24/2017   Asymptomatic, found on CT scan   Blood transfusion without reported diagnosis    Chronic Systolic Heart Failure 05/10/2014   Felt to be non-ischemic and secondary to hypertension.  Echo (05/28/2014): LVEF 25%.  Is not interested in AICD placement.   COPD exacerbation (HCC)    Coronary artery disease involving native coronary artery of native heart without angina pectoris 11/09/2014   Cardiac cath (02/18/2014): Non-obstructive, mRCA 30%, dRCA 60%   Cystocele with uterine prolapse - grade 3 02/12/2016   Not interested in pessary after trying   Diverticulosis of colon 09/24/2017   Diverticulosis of colon 09/24/2017   Essential hypertension 10/15/2013   Gastroesophageal reflux disease 09/24/2017   History of  cerebrovascular accident 05/10/2013   Per patient report 6 previous strokes, most recent on 05/10/13.  No residual deficits.   History of cerebrovascular accident 05/10/2013   Per patient report 6 previous strokes, most recent on 05/10/13.  No residual deficits.   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 (HCC) 10/15/2013     Social History   Socioeconomic History   Marital status: Widowed    Spouse name: Not on file   Number of children: 3   Years of education: Not on file   Highest education level: High school graduate  Occupational History   Occupation: Disabled    Comment: Formerly worked in Dentist  Tobacco Use   Smoking status: Former    Current packs/day: 0.00    Average packs/day: 1 pack/day for 40.0 years (40.0 ttl pk-yrs)    Types: Cigarettes    Start date: 09/17/1979    Quit date: 09/17/2019    Years since quitting: 4.0   Smokeless tobacco: Never  Vaping Use   Vaping status: Never Used  Substance and Sexual Activity   Alcohol use: No    Alcohol/week: 0.0 standard drinks of alcohol   Drug use: No   Sexual activity: Not Currently  Birth control/protection: Post-menopausal  Other Topics Concern   Not on file  Social History Narrative   Current Social History 02/25/2021        Patient lives with family in a home which is 2 stories. There are steps up to the entrance the patient uses with handrails.       Patient's method of transportation is personal car.      The highest level of education was some high school.      The patient currently disabled.      Identified important Relationships  are "Family, my daughter and grandkids"       Pets : My dog       Interests / Fun: "sleeping, working in my flowerbed        Current Stressors: The war in [place]       Social Determinants of Health   Financial Resource Strain: Low Risk  (08/09/2023)   Overall Financial Resource Strain (CARDIA)    Difficulty of Paying Living Expenses: Not hard at all  Food Insecurity: No Food Insecurity (10/16/2023)   Hunger Vital Sign    Worried About Running Out of Food in the Last Year: Never true    Ran Out of Food in the Last Year: Never true  Transportation Needs: Unmet Transportation Needs (10/16/2023)   PRAPARE - Transportation    Lack of Transportation (Medical): Yes    Lack of Transportation (Non-Medical): Yes  Physical Activity: Inactive (05/19/2023)   Exercise Vital Sign    Days of Exercise per Week: 0 days    Minutes of Exercise per Session: 0 min  Stress: No Stress Concern Present (05/19/2023)   [employer] of Occupational Health - Occupational Stress Questionnaire    Feeling of Stress : Not at all  Social Connections: Socially Isolated (05/19/2023)   Social Connection and Isolation Panel [NHANES]    Frequency of Communication with Friends and Family: More than three times a week    Frequency of Social Gatherings with Friends and Family: Never    Attends Religious Services: Never    Database administrator or Organizations: No    Attends Banker Meetings: Never    Marital Status: Widowed   Education Assessment and Provision:  Detailed education and instructions provided on heart failure disease management including the following:  Signs and symptoms of Heart Failure When to call the physician Importance of daily weights Low sodium diet Fluid restriction Medication management Anticipated future follow-up appointments  Patient education given on each of the above topics.  Patient acknowledges understanding via teach back method and acceptance of all  instructions.  Education Materials:  "Living Better With Heart Failure" Booklet, HF zone tool, & Daily Weight Tracker Tool.  Patient has scale at home: \yes Patient has pill box at home: yes    High Risk Criteria for Readmission and/or Poor Patient Outcomes: Heart failure hospital admissions (last 6 months): 1  No Show rate: 4 % Difficult social situation: No  Demonstrates medication adherence: yes, per patient  Primary Language: English  Literacy level: Reading, writing and comprehension  Barriers of Care:   Diet/ fluid restrictions Daily weights  Considerations/Referrals:   Referral made to Heart Failure Pharmacist Stewardship: Yes Referral made to Heart Failure CSW/NCM TOC: No Referral made to Heart & Vascular TOC clinic: Yes, Follow up 11/08/2023 @ 8:30 am. Last seen in [facility] 07/14/23  Items for Follow-up on DC/TOC: Continued HF education Diet/ fluid restrictions Daily weights   Rhae Hammock, BSN, Charity fundraiser  Heart Failure Nurse Navigator Secure Chat Only

## 2023-10-18 NOTE — Progress Notes (Signed)
RT called to bedside for pt struggling with the bipap.  She stated that she was unable to breathe properly with it on and would rathe rsleep with the nasal cannula in instead.  RT instructed her to let us know if she is feeling any sort of respiratory distress, and to let us know if she woiuld like to try bipap again at any point during the night.  RT will continue to monitor

## 2023-10-19 ENCOUNTER — Inpatient Hospital Stay (HOSPITAL_COMMUNITY): Payer: 59

## 2023-10-19 DIAGNOSIS — I509 Heart failure, unspecified: Secondary | ICD-10-CM | POA: Diagnosis not present

## 2023-10-19 DIAGNOSIS — J9621 Acute and chronic respiratory failure with hypoxia: Secondary | ICD-10-CM | POA: Diagnosis not present

## 2023-10-19 DIAGNOSIS — J441 Chronic obstructive pulmonary disease with (acute) exacerbation: Secondary | ICD-10-CM | POA: Diagnosis not present

## 2023-10-19 LAB — GLUCOSE, CAPILLARY
Glucose-Capillary: 257 mg/dL — ABNORMAL HIGH (ref 70–99)
Glucose-Capillary: 313 mg/dL — ABNORMAL HIGH (ref 70–99)
Glucose-Capillary: 349 mg/dL — ABNORMAL HIGH (ref 70–99)
Glucose-Capillary: 378 mg/dL — ABNORMAL HIGH (ref 70–99)

## 2023-10-19 LAB — CBC
HCT: 34.7 % — ABNORMAL LOW (ref 36.0–46.0)
Hemoglobin: 10.4 g/dL — ABNORMAL LOW (ref 12.0–15.0)
MCH: 26.1 pg (ref 26.0–34.0)
MCHC: 30 g/dL (ref 30.0–36.0)
MCV: 87.2 fL (ref 80.0–100.0)
Platelets: 263 10*3/uL (ref 150–400)
RBC: 3.98 MIL/uL (ref 3.87–5.11)
RDW: 17.2 % — ABNORMAL HIGH (ref 11.5–15.5)
WBC: 8.3 10*3/uL (ref 4.0–10.5)
nRBC: 0.4 % — ABNORMAL HIGH (ref 0.0–0.2)

## 2023-10-19 LAB — BASIC METABOLIC PANEL
Anion gap: 7 (ref 5–15)
BUN: 45 mg/dL — ABNORMAL HIGH (ref 8–23)
CO2: 33 mmol/L — ABNORMAL HIGH (ref 22–32)
Calcium: 9 mg/dL (ref 8.9–10.3)
Chloride: 93 mmol/L — ABNORMAL LOW (ref 98–111)
Creatinine, Ser: 1.47 mg/dL — ABNORMAL HIGH (ref 0.44–1.00)
GFR, Estimated: 39 mL/min — ABNORMAL LOW (ref >60)
Glucose, Bld: 334 mg/dL — ABNORMAL HIGH (ref 70–99)
Potassium: 5.2 mmol/L — ABNORMAL HIGH (ref 3.5–5.1)
Sodium: 133 mmol/L — ABNORMAL LOW (ref 135–145)

## 2023-10-19 LAB — MAGNESIUM: Magnesium: 2.4 mg/dL (ref 1.7–2.4)

## 2023-10-19 MED ORDER — INSULIN GLARGINE-YFGN 100 UNIT/ML ~~LOC~~ SOLN
40.0000 [IU] | Freq: Every day | SUBCUTANEOUS | Status: DC
  Administered 2023-10-19 – 2023-10-20 (×2): 40 [IU] via SUBCUTANEOUS
  Filled 2023-10-19 (×2): qty 0.4

## 2023-10-19 MED ORDER — FUROSEMIDE 10 MG/ML IJ SOLN: 80.0000 mg | Freq: Two times a day (BID) | INTRAMUSCULAR | Status: DC

## 2023-10-19 MED ORDER — FUROSEMIDE 10 MG/ML IJ SOLN
80.0000 mg | Freq: Once | INTRAMUSCULAR | Status: AC
  Administered 2023-10-19: 80 mg via INTRAVENOUS
  Filled 2023-10-19: qty 8

## 2023-10-19 MED ORDER — PREDNISONE 20 MG PO TABS: 40.0000 mg | ORAL_TABLET | Freq: Every day | ORAL | Status: DC

## 2023-10-19 MED ORDER — AZITHROMYCIN 250 MG PO TABS
500.0000 mg | ORAL_TABLET | Freq: Every day | ORAL | Status: AC
  Administered 2023-10-19 – 2023-10-20 (×2): 500 mg via ORAL
  Filled 2023-10-19 (×2): qty 2

## 2023-10-19 MED ORDER — SODIUM ZIRCONIUM CYCLOSILICATE 5 G PO PACK
5.0000 g | PACK | Freq: Once | ORAL | Status: AC
  Administered 2023-10-19: 5 g via ORAL
  Filled 2023-10-19: qty 1

## 2023-10-19 MED ORDER — INSULIN ASPART 100 UNIT/ML IJ SOLN: 0.0000 [IU] | Freq: Every day | INTRAMUSCULAR | Status: DC

## 2023-10-19 MED ORDER — INSULIN GLARGINE-YFGN 100 UNIT/ML ~~LOC~~ SOLN
10.0000 [IU] | SUBCUTANEOUS | Status: AC
  Administered 2023-10-19: 10 [IU] via SUBCUTANEOUS
  Filled 2023-10-19: qty 0.1

## 2023-10-19 MED ORDER — INSULIN ASPART 100 UNIT/ML IJ SOLN
0.0000 [IU] | Freq: Three times a day (TID) | INTRAMUSCULAR | Status: DC
Start: 2023-10-19 — End: 2023-10-21
  Administered 2023-10-19 (×2): 15 [IU] via SUBCUTANEOUS
  Administered 2023-10-20 (×2): 4 [IU] via SUBCUTANEOUS
  Administered 2023-10-20: 3 [IU] via SUBCUTANEOUS

## 2023-10-19 NOTE — Progress Notes (Addendum)
HD#3 SUBJECTIVE:  Patient Summary: Alicia [name] is a 67 y.o. 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    Overnight Events: None    Interim History: She is evaluated bedside in the afternoon, she appeared in mildly distressed looking for her call bell. She had the HFNC on with 15L oxygen, reported that she is not able to breath. She wanted to be placed back on BiPAP. Reported that her breathing is not well, she feels dyspneic.    OBJECTIVE:  Vital Signs: Vitals:   10/18/23 2000 10/19/23 0040 10/19/23 0525 10/19/23 0731  BP: (!) 161/67 (!) 150/62 (!) 162/61   Pulse: 64 (!) 53 (!) 53   Resp: 20 20 20 20   Temp: 97.8 F (36.6 C) 97.8 F (36.6 C) 98.2 F (36.8 C)   TempSrc: Oral Oral Oral   SpO2: 95% 95% 90% 92%  Weight:   103.6 kg   Height:       Supplemental O2:  HFNC SpO2: 92 % O2 Flow Rate (L/min): 15 L/min FiO2 (%): 60 %  Filed Weights   10/17/23 0450 10/18/23 0011 10/19/23 0525  Weight: 101.4 kg 103.5 kg 103.6 kg     Intake/Output Summary (Last 24 hours) at 10/19/2023 0753 Last data filed at 10/19/2023 5366 Gross per 24 hour  Intake 240 ml  Output 875 ml  Net -635 ml   Net IO Since Admission: -1,545 mL [10/19/23 0753]  Physical Exam: Physical Exam  General: appears in distress, HFNC 15L Head: Normocephalic, atraumatic Cardio: Regular rate and rhythm, no murmurs, rubs or gallops. 1+ pitting edema bilateral distal LE  Pulmonary: +rales and crackles in the lower lung fields bilaterally  Abdomen: Soft, nontender with normoactive bowel  sounds  Neuro: Alert and oriented x3, no focal deficits    Patient Lines/Drains/Airways Status     Active Line/Drains/Airways     Name Placement date Placement time Site Days   Peripheral IV 10/16/23 20 G Right Antecubital 10/16/23  1506  Antecubital  3   External Urinary Catheter 10/16/23  1858  --  3             ASSESSMENT/PLAN:  Assessment: Active Problems:   Type 2 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  Plan:   #Acute on Chronic Hypoxic Respiratory Failure #Acute on Chronic HF Exacerbation #Acute COPD exacerbation  Presented with worsening dyspnea and fatigue for 1 week with increasing oxygen requirement today. Initially placed on NRB, was saturating at 69%, then placed on BiPAP in ED. CXR showed mild pulmonary vascular congestion, with small bilateral layering pleural effusions. BNP >2000; VBG shows pH 7.3, bicarb 39.2, pCO2 73.2; lung exam notable for bilateral rales and bibasilar crackles. She also had 1+ pitting edema below mid-shin  bilaterally. She was given one dose of IV lasix 40 mg in ED; she responded well with improved breathing. She has received total 3 doses of IV Lasix 80 mg since admission, with total of -910 urine output. Echo shows EF 50-55%, unchanged from previous 07/2023. Per lung exam today, she has diffuse crackles and diminished breath sounds notable anteriorly, concerned for COPD exacerbation. She is given one IV dose of methylprednisolone on 11/25 and started on IV Zithromax 3 days.  Per evaluation today, she appeared in distress with HFNC 15 L, reported that she was having trouble breathing. She is placed back on BiPAP. Per physical exam, rales are notable bibasilar lung fields with crackles, and she has 1+ ankle edema bilaterally.    Plan:  - Repeat chest xray to rule out consolidation/pleural effusion - START Lasix 80 mg scheduled 1500, 2200 - Continue Yupelri, Budesonide and Atrovent, breathing treatments  - Continue prednisone 40 mg today for 2/4 days - Continue IV azithromycin 500 mg IV for 2/3 days - BiPAP at bedtime, and as needed  - Daily weight checks with Strict Is & Os - PT/OT recommended Home health PT/OT    #AKI #Cardiorenal syndrome  Presented with Cr 1.13, elevated from 0.75 on 09/03/2023 likely pre-renal, in the setting of HF exacerbation. Cr continues to trend higher in the setting of high dose lasix; She also had concerns of straining while urinating in the ED. UA negative infection. Bladder scan on 11/24 showed 0 PVR . There is likely a component of cardiorenal syndrome that is playing in role in her AKI in the setting of worsening HF exacerbation.  - Plan to give Lasix 80 mg BID for her worsening HF.    #Chest pain, noncardiac  #Elevated Troponin #Demand Ischemia  Reported chest pain for the past 4 days, muscular like that comes and goes, worsened with movement. Pain is reproducible upon palpation. Troponin elevated to 19, 24, low suspicion for ACS at this time. Likely demand  ischemia in the setting of volume overload. Tylenol as needed for pain     #Hypomagnesemia  Improved.    Chronic Conditions:  Type 2 Diabetes with nonproliferative diabetic retinopathy: Increased it to Semglee 40 units with resistant sliding scale   HLD: Continue lipitor 80 mg daily and zetia 10 mg daily Paroxysmal A fib: Currently NSR, resumed Eliquis 5 mg BID HTN: Resumed home medication amlodipine 10 mg daily.   Best Practice: Diet: Cardiac diet IVF: Fluids: NONE, Rate: None VTE: Eliquis Code: Full AB: IV Azithro 2/3  Therapy Recs: None, DME: none Family Contact: Felicia  DISPO: Anticipated discharge  3 days  to Home pending IV antibiotics, New oxygen, and medical management .  Signature: Jeral Pinch, D.O.  Internal Medicine Resident, PGY-1 [facility]  Pager: [phone] 7:53 AM, 10/19/2023   Please contact the on call pager after 5 pm and on weekends at [phone].

## 2023-10-19 NOTE — Progress Notes (Signed)
Occupational Therapy Treatment Patient Details Name: Alicia [name] MRN: [id number] DOB: 04/07/1956 Today's Date: 10/19/2023   History of present illness Pt is 67 yo presenting to [facility] ED on 11/23 due to worsening shortness of breath and fatigue and admitted for acute on chronic hypoxic respiratory failure secondary to CHF exacerbation. PMH: COPD, CHF, DM-2, diabetic retinopathy, SA node dysfunction, Hx CVA, CAD, and recent admissions for PNA and COVID-19 in 06/2023 and 07/2023.   OT comments  Pt progressing towards OT goals this session. Eager to work with therapy and pleasant/cooperative. Pt with significantly decreased activity tolerance and current cardiopulmonary status affecting functional level. Pt requiring frequent rest breaks and use of pursed lip breathing during functional tasks to manage SOB on 15L continuous O2 through HFNC. Pt was able to engage in toilet transfer with short in room mobility with RW as well as standing peri care, seated grooming tasks, and short ambulation back to recliner. OT will continue to follow acutely. POC remains appropriate at this time.       If plan is discharge home, recommend the following:  Assistance with cooking/housework;Assist for transportation;Help with stairs or ramp for entrance;A lot of help with walking and/or transfers;A lot of help with bathing/dressing/bathroom   Equipment Recommendations  BSC/3in1 (wide version)    Recommendations for Other Services      Precautions / Restrictions Precautions Precautions: Fall Precaution Comments: watch O2, on 15L HFNC for session Restrictions Weight Bearing Restrictions: No       Mobility Bed Mobility Overal bed mobility: Needs Assistance Bed Mobility: Supine to Sit     Supine to sit: Used rails, HOB elevated, Contact guard     General bed mobility comments: increased time and pt using bed rail and HOB elevated    Transfers Overall transfer level: Needs assistance Equipment used:  Rolling walker (2 wheels) Transfers: Sit to/from Stand Sit to Stand: Min assist, +2 safety/equipment           General transfer comment: Pt able to stand with +2 safety and cues for safe hand placement.     Balance Overall balance assessment: Needs assistance Sitting-balance support: No upper extremity supported, Feet supported Sitting balance-Leahy Scale: Good     Standing balance support: Bilateral upper extremity supported, During functional activity Standing balance-Leahy Scale: Fair                             ADL either performed or assessed with clinical judgement   ADL Overall ADL's : Needs assistance/impaired     Grooming: Set up;Sitting;Wash/dry hands;Wash/dry face Grooming Details (indicate cue type and reason): seated needed for SpO2 Upper Body Bathing: Minimal assistance;Sitting Upper Body Bathing Details (indicate cue type and reason): for back             Toilet Transfer: Minimal assistance;+2 for safety/equipment;BSC/3in1;Ambulation;Rolling walker (2 wheels) (short distance from bed to sink)   Toileting- Architect and Hygiene: Sit to/from stand;Moderate assistance Toileting - Clothing Manipulation Details (indicate cue type and reason): Pt able to stand and perform one wipe, then maintained standing for OT to ensure cleanliness.     Functional mobility during ADLs: Minimal assistance;Rolling walker (2 wheels);+2 for safety/equipment General ADL Comments: Pt with significantly decreased activity tolerance and current cardiopulmonary status affecting functional level. Pt requiring frequent rest breaks and use of pursed lip breathing during functional tasks to manage SOB on 15L continuous O2 through HFNC.    Extremity/Trunk Assessment Upper Extremity Assessment  Upper Extremity Assessment: Right hand dominant   Lower Extremity Assessment Lower Extremity Assessment: Defer to PT evaluation        Vision   Vision Assessment?: No  apparent visual deficits   Perception     Praxis      Cognition Arousal: Alert Behavior During Therapy: WFL for tasks assessed/performed Overall Cognitive Status: Within Functional Limits for tasks assessed                                 General Comments: AAOx4 and pleasant throughout session. Able to follow multi-step instructions consistently.        Exercises      Shoulder Instructions       General Comments SpO2 dropped dramatically with activity from 92% on 15L via HFNC to 82%. Pt with 3/4 DOE and cues for PLB. (it is this therapists opinion that she is a mouth breather if given the choice)    Pertinent Vitals/ Pain       Pain Assessment Pain Assessment: No/denies pain  Home Living                                          Prior Functioning/Environment              Frequency  Min 1X/week        Progress Toward Goals  OT Goals(current goals can now be found in the care plan section)  Progress towards OT goals: Progressing toward goals  Acute Rehab OT Goals Patient Stated Goal: get home to see her Grandkids OT Goal Formulation: With patient Time For Goal Achievement: 11/08/2023 Potential to Achieve Goals: Fair ADL Goals Pt Will Perform Lower Body Bathing: with supervision;sit to/from stand;sitting/lateral leans (with adaptive equipment as needed) Pt Will Perform Lower Body Dressing: with supervision;sit to/from stand;sitting/lateral leans (with adaptive equipment as needed) Pt Will Transfer to Toilet: with modified independence;bedside commode;ambulating (with least restrictive AD) Pt Will Perform Toileting - Clothing Manipulation and hygiene: with supervision;sit to/from stand;sitting/lateral leans Pt/caregiver will Perform Home Exercise Program: Increased strength;Both right and left upper extremity;Independently;With written HEP provided (AROM progressing to theraband; Increased activity tolerance) Additional ADL Goal  #1: Patient will demonstrate understanding of education in strategies for managing CHF and COPD through teach back with handouts provided.  Plan      Co-evaluation    PT/OT/SLP Co-Evaluation/Treatment: Yes Reason for Co-Treatment: Other (comment) (poor activity tolerance) PT goals addressed during session: Mobility/safety with mobility;Balance;Strengthening/ROM OT goals addressed during session: ADL's and self-care;Other (comment) (Increased activity tolerance)      AM-PAC OT "6 Clicks" Daily Activity     Outcome Measure   Help from another person eating meals?: A Little Help from another person taking care of personal grooming?: A Little Help from another person toileting, which includes using toliet, bedpan, or urinal?: A Lot Help from another person bathing (including washing, rinsing, drying)?: A Lot Help from another person to put on and taking off regular upper body clothing?: A Little Help from another person to put on and taking off regular lower body clothing?: A Lot 6 Click Score: 15    End of Session Equipment Utilized During Treatment: Oxygen;Gait belt;Rolling walker (2 wheels) (15L via HFNC)  OT Visit Diagnosis: Other abnormalities of gait and mobility (R26.89);Unsteadiness on feet (R26.81);Muscle weakness (generalized) (M62.81);Other (comment) (decreased activity tolerance)  Activity Tolerance Patient tolerated treatment well (current cardiopulmonary status)   Patient Left with call bell/phone within reach;in chair   Nurse Communication Mobility status        Time: 1610-9604 OT Time Calculation (min): 40 min  Charges: OT General Charges $OT Visit: 1 Visit OT Treatments $Self Care/Home Management : 23-37 mins  Nyoka Cowden OTR/L Acute Rehabilitation Services Office: [phone]   [facility] 10/19/2023, 10:58 AM

## 2023-10-19 NOTE — Progress Notes (Signed)
Physical Therapy Treatment Patient Details Name: Alicia [name] MRN: [id number] DOB: 09-10-1956 Today's Date: 10/19/2023   History of Present Illness Pt is 67 yo presenting to [facility] ED on 11/23 due to worsening shortness of breath and fatigue and admitted for acute on chronic hypoxic respiratory failure secondary to CHF exacerbation. PMH: COPD, CHF, DM-2, diabetic retinopathy, SA node dysfunction, Hx CVA, CAD, and recent admissions for PNA and COVID-19 in 06/2023 and 07/2023.    PT Comments  Pt mobility greatly limited by respiratory status. SpO2 dec to 82% with activity while on 15LO2 via HFNC inhibiting ability to ambulate at this time, limiting ability to tolerate seated LE exercises, and limiting overall activity tolerance. Pt motivated and eager to try however limited by 3/4 DOE and Significant drop in SpO2 while requiring high O2 supplementation. Acute PT to cont to follow.    If plan is discharge home, recommend the following: A lot of help with walking and/or transfers;A little help with bathing/dressing/bathroom;Help with stairs or ramp for entrance   Can travel by private vehicle        Equipment Recommendations  None recommended by PT    Recommendations for Other Services       Precautions / Restrictions Precautions Precautions: Fall Precaution Comments: watch O2, on 15L HFNC for session Restrictions Weight Bearing Restrictions: No     Mobility  Bed Mobility Overal bed mobility: Needs Assistance Bed Mobility: Sit to Supine     Supine to sit: Contact guard     General bed mobility comments: able to sit EOB and bring LEs back into bed into long sit position while PT elevated HOB, no physical assist needed, DOE 4/4, SpO2 at 88% on 15LO2 via HFNC    Transfers Overall transfer level: Needs assistance Equipment used: Rolling walker (2 wheels) Transfers: Sit to/from Stand, Bed to chair/wheelchair/BSC Sit to Stand: Min assist   Step pivot transfers: Min assist        General transfer comment: Pt able to stand with rocking momentum, able to take 5 steps with RW from recliner to EOB, noted DOE    Ambulation/Gait               General Gait Details: pt declined due to fatigue and difficulty breathing   Stairs             Wheelchair Mobility     Tilt Bed    Modified Rankin (Stroke Patients Only)       Balance Overall balance assessment: Needs assistance Sitting-balance support: No upper extremity supported, Feet supported Sitting balance-Leahy Scale: Good     Standing balance support: Bilateral upper extremity supported, During functional activity Standing balance-Leahy Scale: Fair Standing balance comment: limited by onset of fatigue and difficulty breathing                            Cognition Arousal: Alert Behavior During Therapy: WFL for tasks assessed/performed Overall Cognitive Status: Within Functional Limits for tasks assessed                                 General Comments: AAOx4 and pleasant throughout session. Able to follow multi-step instructions consistently.        Exercises General Exercises - Lower Extremity Long Arc Quad: AROM, Both, 5 reps, Seated (limited by onset of fatigue) Hip Flexion/Marching: AROM, Both, 5 reps (limited by onset of fatigue)  General Comments General comments (skin integrity, edema, etc.): SpO2 into 80s with activity on 15LO2 via HFNC, 3/4 DOE      Pertinent Vitals/Pain Pain Assessment Pain Assessment: No/denies pain    Home Living                          Prior Function            PT Goals (current goals can now be found in the care plan section) Acute Rehab PT Goals Patient Stated Goal: to return home PT Goal Formulation: With patient Time For Goal Achievement: 11/11/2023 Potential to Achieve Goals: Fair Progress towards PT goals: Not progressing toward goals - comment (due to respiratory status)    Frequency    Min  1X/week      PT Plan      Co-evaluation   Reason for Co-Treatment: Other (comment) (poor activity tolerance) PT goals addressed during session: Mobility/safety with mobility;Balance;Strengthening/ROM OT goals addressed during session: ADL's and self-care;Other (comment) (Increased activity tolerance)      AM-PAC PT "6 Clicks" Mobility   Outcome Measure  Help needed turning from your back to your side while in a flat bed without using bedrails?: A Little Help needed moving from lying on your back to sitting on the side of a flat bed without using bedrails?: A Little Help needed moving to and from a bed to a chair (including a wheelchair)?: A Little Help needed standing up from a chair using your arms (e.g., wheelchair or bedside chair)?: A Lot Help needed to walk in hospital room?: Total Help needed climbing 3-5 steps with a railing? : Total 6 Click Score: 13    End of Session Equipment Utilized During Treatment: Gait belt;Oxygen (15lO2 via HFNC) Activity Tolerance: Patient limited by fatigue Patient left: in bed;with call bell/phone within reach;with bed alarm set Nurse Communication: Mobility status PT Visit Diagnosis: Other abnormalities of gait and mobility (R26.89);Muscle weakness (generalized) (M62.81)     Time: 4098-1191 PT Time Calculation (min) (ACUTE ONLY): 16 min  Charges:    $Therapeutic Activity: 8-22 mins PT General Charges $$ ACUTE PT VISIT: 1 Visit                     Lewis Shock, PT, DPT Acute Rehabilitation Services Secure chat preferred Office #: [phone]    Iona Hansen 10/19/2023, 1:08 PM

## 2023-10-19 NOTE — Progress Notes (Signed)
   10/19/23 2209  Vent Select  Invasive or Noninvasive Noninvasive  Adult Vent Y  Adult Ventilator Settings  Vent Type Servo i  Vent Mode PCV;BIPAP  Set Rate 16 bmp  FiO2 (%) 60 %  IPAP 15 cmH20  EPAP 5 cmH20  Pressure Control 10 cmH20  Adult Ventilator Measurements  Peak Airway Pressure 19 L/min  Resp Rate Spontaneous 19 br/min  Resp Rate Total 35 br/min  Exhaled Vt 463 mL  Measured Ve 11.8 L  Auto PEEP 0 cmH20  Total PEEP 5 cmH20  Adult Ventilator Alarms  Alarms On Y   Pt resting comfortably after some setting changes.  Pt has been instructed to call out if she is having any difficulties with these new settings.  RT will continue to monitor

## 2023-10-19 NOTE — Inpatient Diabetes Management (Addendum)
Inpatient Diabetes Program Recommendations  AACE/ADA: New Consensus Statement on Inpatient Glycemic Control (2015)  Target Ranges:  Prepandial:   less than 140 mg/dL      Peak postprandial:   less than 180 mg/dL (1-2 hours)      Critically ill patients:  140 - 180 mg/dL   Lab Results  Component Value Date   GLUCAP 257 (H) 10/19/2023   HGBA1C 8.1 (A) 09/03/2023    Review of Glycemic Control  Latest Reference Range & Units 10/18/23 06:12 10/18/23 11:33 10/18/23 15:48 10/18/23 21:18 10/19/23 06:06  Glucose-Capillary 70 - 99 mg/dL 782 (H) 956 (H) 213 (H) 390 (H) 257 (H)  (H): Data is abnormally high  Diabetes history: DM2 Outpatient Diabetes medications: Lantus 30 units every day, Humalog 14 units TID Current orders for Inpatient glycemic control: Semglee 40 units every day, Novolog 0-20 units TID and 0-5 units at bedtime, Prednisone 40 mg QD  Inpatient Diabetes Program Recommendations:    Please consider:  Novolog 3 units TID with meals if she consumes at least 50%.  Will continue to follow while inpatient.  Thank you, Dulce Sellar, MSN, CDCES Diabetes Coordinator Inpatient Diabetes Program [phone] (team pager from 8a-5p)

## 2023-10-19 NOTE — Plan of Care (Signed)
  Problem: Coping: Goal: Ability to adjust to condition or change in health will improve Outcome: Progressing   Problem: Fluid Volume: Goal: Ability to maintain a balanced intake and output will improve Outcome: Progressing   Problem: Nutritional: Goal: Maintenance of adequate nutrition will improve Outcome: Progressing   Problem: Education: Goal: Knowledge of disease or condition will improve Outcome: Progressing   Problem: Respiratory: Goal: Ability to maintain adequate ventilation will improve Outcome: Progressing   Problem: Education: Goal: Ability to demonstrate management of disease process will improve Outcome: Progressing

## 2023-10-19 NOTE — Progress Notes (Signed)
RT informed that after multiple instances of taking the bipap on and off, the Pt would no longer like to wear the bipap for the rest of the night.  Pt placed back on 15L [place].  RT will continue to monitor

## 2023-10-19 NOTE — Progress Notes (Signed)
Heart Failure Stewardship Pharmacist Progress Note   PCP: [name], MD PCP-Cardiologist: Donato Schultz, MD    HPI:  67 yo F with PMH of COPD, oxygen dependent, CHF (EF 20-25% in 2015, recovered to 55-60%), CAD, HLD, GERD, T2DM, and CVA.    Admitted in Nov 2023 with acute on chronic CHF exacerbation. Diuresed w/ IV Lasix and treated w/ abx, steroids and nebs. Echo showed normal EF 55-60% and normal RV. After diureses, was transitioned to PO Lasix. Also placed on Entresto.   She was seen in HF TOC 10/2022. Jardiance increased to 10 mg daily and spironlactone added. She was seen back in [facility] 11/2022 and spironolactone was increased to 25 mg daily. She was later seen at [facility] on 1/30 and lasix was decreased to 80 mg AM and 40 mg PM.   Admitted 01/2023 with acute on chronic CHF exacerbation. ECHO 3/17 showed LVEF 60-65%. Diuresed with IV lasix 120 mg, metolazone, and diamox. Discharged on Entresto, spironolactone, amlodipine, and lasix. [name] held due to UTI.    Admitted 05/2023 with a/c CHF. CXR showed pulmonary congestion. BNP elevated. ECHO 7/24 showed LVEF 50-60%, G3DD, no RWMA, RV normal, moderately elevated PA pressure. Spironolactone with held at discharge.   She was seen again in HF TOC 06/2023. Volume status trending up. She was instructed to increase torsemide to 40 mg AM and 20 mg PM. Spironolactone was restarted.    Admitted 06/2023 with COPD exacerbation due to COVID-19 infection. She was discharged on 9/4.    Admitted 07/2023 when family found her unresponsive and hypoxic at home and was being treated for acute CHF. ECHO 9/15 showed LVEF 50-55%, global hypokinesis, G3DD, RV normal, moderate to severe TR and worsening pulmonary HTN. Discharged on increased Entresto, spironolactone, and torsemide 60 mg daily.  Presented to the ED on 11/23 with shortness of breath and chest pain for 1 week. CXR with CHF and mild pulmonary edema. BNP elevated. ECHO 11/24 with EF 50-55%. Reports  compliance with her medications at home. Reported taking both furosemide (last filled 9/4 x 90 days) and torsemide (last filled 11/8 x 90 days). Also reported urinary hesitancy. Currently denying any dysuria today. Alternating between HFNC and BiPAP.   Current HF Medications: Diuretic: furosemide 80 mg IV BID  Prior to admission HF Medications: Diuretic: furosemide 80 mg daily AND torsemide 40 mg BID ACE/ARB/ARNI: Entresto 97/103 mg BID MRA: spironolactone 25 mg daily - stated not taking and switched to torsemide?  Pertinent Lab Values: Serum creatinine 1.47, BUN 45, Potassium 5.2, Sodium 133, BNP 2554.5, Magnesium 2.4, A1c 8.1   Vital Signs: Weight: 228 lbs (admission weight: 236 lbs) Blood pressure: 120-150/60s  Heart rate: 50s  I/O: net -1.1L yesterday; net -1.1L since admission  Medication Assistance / Insurance Benefits Check: Does the patient have prescription insurance?  Yes Type of insurance plan: [place] Medicaid  Outpatient Pharmacy:  [facility] Is the patient willing to use [facility] TOC pharmacy at discharge? Yes Is the patient willing to transition their outpatient pharmacy to utilize a [facility] outpatient pharmacy?   No    Assessment: 1. 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 NYHA class III symptoms. - Remains volume overloaded on exam. Continue furosemide 80 mg IV BID. Stop furosemide tablets at discharge. Strict I/Os and daily weights. Keep K>4 and Mg>2. - No BB with bradycardia - Holding Entresto with AKI - Reported stopping spironolactone due to side effects - increased tremors - tremors resolved  with discontinuation - Held [name] last admission with UTI. Given dysuria this admission with AKI - continue holding.    Plan: 1) Medication changes recommended at this time: - Continue IV diuresis - Remove duplicate diuretics on discharge - Remove spironolactone from medication list at discharge - Stop  hydrochlorothiazide on discharge  2) Patient assistance: - None pending, has [place] Medicaid  3)  Education  - To be completed prior to discharge - unable to complete education with BiPAP  Sharen Hones, PharmD, BCPS Heart Failure Stewardship Pharmacist Phone [phone]

## 2023-10-20 DIAGNOSIS — Z87891 Personal history of nicotine dependence: Secondary | ICD-10-CM

## 2023-10-20 DIAGNOSIS — J441 Chronic obstructive pulmonary disease with (acute) exacerbation: Secondary | ICD-10-CM | POA: Diagnosis not present

## 2023-10-20 DIAGNOSIS — J9621 Acute and chronic respiratory failure with hypoxia: Secondary | ICD-10-CM | POA: Diagnosis not present

## 2023-10-20 DIAGNOSIS — I509 Heart failure, unspecified: Secondary | ICD-10-CM | POA: Diagnosis not present

## 2023-10-20 LAB — BASIC METABOLIC PANEL
Anion gap: 9 (ref 5–15)
BUN: 60 mg/dL — ABNORMAL HIGH (ref 8–23)
CO2: 32 mmol/L (ref 22–32)
Calcium: 9.2 mg/dL (ref 8.9–10.3)
Chloride: 93 mmol/L — ABNORMAL LOW (ref 98–111)
Creatinine, Ser: 1.45 mg/dL — ABNORMAL HIGH (ref 0.44–1.00)
GFR, Estimated: 40 mL/min — ABNORMAL LOW (ref >60)
Glucose, Bld: 214 mg/dL — ABNORMAL HIGH (ref 70–99)
Potassium: 5.1 mmol/L (ref 3.5–5.1)
Sodium: 134 mmol/L — ABNORMAL LOW (ref 135–145)

## 2023-10-20 LAB — GLUCOSE, CAPILLARY
Glucose-Capillary: 142 mg/dL — ABNORMAL HIGH (ref 70–99)
Glucose-Capillary: 197 mg/dL — ABNORMAL HIGH (ref 70–99)
Glucose-Capillary: 162 mg/dL — ABNORMAL HIGH (ref 70–99)
Glucose-Capillary: 115 mg/dL — ABNORMAL HIGH (ref 70–99)

## 2023-10-20 LAB — BLOOD GAS, ARTERIAL
Acid-Base Excess: 10.5 mmol/L — ABNORMAL HIGH (ref 0.0–2.0)
Bicarbonate: 39.4 mmol/L — ABNORMAL HIGH (ref 20.0–28.0)
O2 Saturation: 86.8 %
Patient temperature: 36.5
pCO2 arterial: 71 mm[Hg] (ref 32–48)
pH, Arterial: 7.35 (ref 7.35–7.45)
pO2, Arterial: 53 mm[Hg] — ABNORMAL LOW (ref 83–108)

## 2023-10-20 LAB — CBC
HCT: 33.9 % — ABNORMAL LOW (ref 36.0–46.0)
Hemoglobin: 10.1 g/dL — ABNORMAL LOW (ref 12.0–15.0)
MCH: 25.6 pg — ABNORMAL LOW (ref 26.0–34.0)
MCHC: 29.8 g/dL — ABNORMAL LOW (ref 30.0–36.0)
MCV: 86 fL (ref 80.0–100.0)
Platelets: 289 10*3/uL (ref 150–400)
RBC: 3.94 MIL/uL (ref 3.87–5.11)
RDW: 17.2 % — ABNORMAL HIGH (ref 11.5–15.5)
WBC: 12.2 10*3/uL — ABNORMAL HIGH (ref 4.0–10.5)
nRBC: 0.3 % — ABNORMAL HIGH (ref 0.0–0.2)

## 2023-10-20 LAB — MAGNESIUM: Magnesium: 2.3 mg/dL (ref 1.7–2.4)

## 2023-10-20 MED ORDER — FUROSEMIDE 10 MG/ML IJ SOLN
120.0000 mg | Freq: Three times a day (TID) | INTRAVENOUS | Status: DC
  Filled 2023-10-20 (×3): qty 12

## 2023-10-20 MED ORDER — HYDROXYZINE HCL 50 MG/ML IM SOLN
50.0000 mg | Freq: Once | INTRAMUSCULAR | Status: AC
  Administered 2023-10-20: 50 mg via INTRAMUSCULAR
  Filled 2023-10-20: qty 1

## 2023-10-20 MED ORDER — FUROSEMIDE 10 MG/ML IJ SOLN
120.0000 mg | Freq: Three times a day (TID) | INTRAVENOUS | Status: AC
  Administered 2023-10-20 (×3): 120 mg via INTRAVENOUS
  Filled 2023-10-20 (×3): qty 10

## 2023-10-20 MED ORDER — HYDROXYZINE HCL 25 MG PO TABS
25.0000 mg | ORAL_TABLET | Freq: Two times a day (BID) | ORAL | Status: DC | PRN
  Administered 2023-10-20 – 2023-10-25 (×6): 25 mg via ORAL
  Filled 2023-10-20 (×7): qty 1

## 2023-10-20 NOTE — Progress Notes (Signed)
Heart Failure Stewardship Pharmacist Progress Note   PCP: [name], MD PCP-Cardiologist: Donato Schultz, MD    HPI:  67 yo F with PMH of COPD, oxygen dependent, CHF (EF 20-25% in 2015, recovered to 55-60%), CAD, HLD, GERD, T2DM, and CVA.    Admitted in Nov 2023 with acute on chronic CHF exacerbation. Diuresed w/ IV Lasix and treated w/ abx, steroids and nebs. Echo showed normal EF 55-60% and normal RV. After diureses, was transitioned to PO Lasix. Also placed on Entresto.   She was seen in HF TOC 10/2022. Jardiance increased to 10 mg daily and spironlactone added. She was seen back in [facility] 11/2022 and spironolactone was increased to 25 mg daily. She was later seen at [facility] on 1/30 and lasix was decreased to 80 mg AM and 40 mg PM.   Admitted 01/2023 with acute on chronic CHF exacerbation. ECHO 3/17 showed LVEF 60-65%. Diuresed with IV lasix 120 mg, metolazone, and diamox. Discharged on Entresto, spironolactone, amlodipine, and lasix. [name] held due to UTI.    Admitted 05/2023 with a/c CHF. CXR showed pulmonary congestion. BNP elevated. ECHO 7/24 showed LVEF 50-60%, G3DD, no RWMA, RV normal, moderately elevated PA pressure. Spironolactone with held at discharge.   She was seen again in HF TOC 06/2023. Volume status trending up. She was instructed to increase torsemide to 40 mg AM and 20 mg PM. Spironolactone was restarted.    Admitted 06/2023 with COPD exacerbation due to COVID-19 infection. She was discharged on 9/4.    Admitted 07/2023 when family found her unresponsive and hypoxic at home and was being treated for acute CHF. ECHO 9/15 showed LVEF 50-55%, global hypokinesis, G3DD, RV normal, moderate to severe TR and worsening pulmonary HTN. Discharged on increased Entresto, spironolactone, and torsemide 60 mg daily.  Presented to the ED on 11/23 with shortness of breath and chest pain for 1 week. CXR with CHF and mild pulmonary edema. BNP elevated. ECHO 11/24 with EF 50-55%. Reports  compliance with her medications at home. Reported taking both furosemide (last filled 9/4 x 90 days) and torsemide (last filled 11/8 x 90 days). Also reported urinary hesitancy. Currently denying any dysuria today. Still alternating between HFNC and BiPAP.   Current HF Medications: Diuretic: furosemide 120 mg IV TID  Prior to admission HF Medications: Diuretic: furosemide 80 mg daily AND torsemide 40 mg BID ACE/ARB/ARNI: Entresto 97/103 mg BID MRA: spironolactone 25 mg daily - stated not taking and switched to torsemide?  Pertinent Lab Values: Serum creatinine 1.45, BUN 60, Potassium 5.1, Sodium 134, BNP 2554.5, Magnesium 2.4, A1c 8.1   Vital Signs: Weight: 231 lbs (admission weight: 236 lbs) Blood pressure: 150/60s  Heart rate: 50s  I/O: net +0.2L yesterday; net -1.8L since admission  Medication Assistance / Insurance Benefits Check: Does the patient have prescription insurance?  Yes Type of insurance plan: [place] Medicaid  Outpatient Pharmacy:  [facility] the patient willing to use [facility] TOC pharmacy at discharge? Yes Is the patient willing to transition their outpatient pharmacy to utilize a [facility] outpatient pharmacy?   No    Assessment: 1. 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 NYHA class III symptoms. - Remains volume overloaded on exam. Weight is up from yesterday. Breathing is about the same. Agree with increasing to furosemide 120 mg IV TID. Stop furosemide tablets at discharge. Strict I/Os and daily weights. Keep K>4 and Mg>2. - No BB with bradycardia - Holding Entresto with AKI -  Reported stopping spironolactone due to side effects - increased tremors - tremors resolved with discontinuation - Held [place] last admission with UTI. Given dysuria this admission with AKI - continue holding.    Plan: 1) Medication changes recommended at this time: - Agree with increasing IV lasix - Remove duplicate diuretics on  discharge - Remove spironolactone from medication list at discharge - Stop hydrochlorothiazide on discharge  2) Patient assistance: - None pending, has [place] Medicaid  3)  Education  - To be completed prior to discharge - unable to complete education with BiPAP  Sharen Hones, PharmD, BCPS Heart Failure Stewardship Pharmacist Phone [phone]

## 2023-10-20 NOTE — Inpatient Diabetes Management (Signed)
Inpatient Diabetes Program Recommendations  AACE/ADA: New Consensus Statement on Inpatient Glycemic Control (2015)  Target Ranges:  Prepandial:   less than 140 mg/dL      Peak postprandial:   less than 180 mg/dL (1-2 hours)      Critically ill patients:  140 - 180 mg/dL   Lab Results  Component Value Date   GLUCAP 142 (H) 10/20/2023   HGBA1C 8.1 (A) 09/03/2023    Review of Glycemic Control  Latest Reference Range & Units 10/19/23 06:06 10/19/23 10:52 10/19/23 15:32 10/19/23 21:24 10/20/23 06:09  Glucose-Capillary 70 - 99 mg/dL 299 (H) 371 (H) 696 (H) 378 (H) 142 (H)  (H): Data is abnormally high  Diabetes history: DM2 Outpatient Diabetes medications: Lantus 30 units every day, Humalog 14 units TID Current orders for Inpatient glycemic control: Semglee 40 units every day, Novolog 0-20 units TID and 0-5 units at bedtime   Inpatient Diabetes Program Recommendations:     Please consider:   Novolog 3 units TID with meals if she consumes at least 50%.   Will continue to follow while inpatient.   Thank you, Dulce Sellar, MSN, CDCES Diabetes Coordinator Inpatient Diabetes Program [phone] (team pager from 8a-5p)

## 2023-10-20 NOTE — Progress Notes (Signed)
HD#4 SUBJECTIVE:  Patient Summary: Alicia [name] is a 67 y.o. 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   Overnight Events: Added Hydroxyzine prn for anxiety   Interim History: Pt is evaluated at bedside this AM, initially she was on HFNC, reported that her breathing was better., though she continues to wheeze.   At 10:20 AM: Re-evaluated her, messaged by RN that patient kept taking off her BiPAP, her sats dipped to the 70s on 15L HFNC w/ labored breathing. She was evaluated at bedside with BiPAP on, she wanted to take the BiPAP off, we trial her on HFNC 15 L but she satted in 70s, placed her back on BiPAP. We told her to keep the BiPAP on, and will re-evaluate in the afternoon.   OBJECTIVE:  Vital Signs: Vitals:   10/19/23 1529 10/19/23 2002 10/20/23 0110 10/20/23 0500  BP: 116/69 (!) 174/64 (!) 159/126 (!) 155/67  Pulse: (!) 52 64 (!) 52 (!) 53  Resp: 20 20 20 20   Temp: (!) 96.7 F (35.9 C) 97.6 F (36.4 C) 98 F (36.7 C) 97.7 F (36.5 C)  TempSrc: Axillary Axillary Oral Oral  SpO2: 90% 96% 94% 93%  Weight:    77.9 kg  Height:       Supplemental O2:  BiPAP SpO2: 93 % O2 Flow Rate (L/min): 15 L/min FiO2 (%): 60 %  Filed Weights   10/18/23 0011 10/19/23 0525 10/20/23 0500  Weight: 103.5 kg 103.6 kg 77.9 kg     Intake/Output Summary (Last 24 hours) at 10/20/2023 6045 Last data filed at 10/19/2023 2200 Gross per 24 hour  Intake 711 ml  Output 210 ml  Net 501 ml   Net IO Since Admission: -1,044 mL [10/20/23 0821]  Physical Exam: Physical  Exam  General: Appears in mild distress with BiPAP on  Head: Normocephalic, atraumatic Cardio: Regular rate and rhythm, no murmurs, rubs or gallops. 2+ pitting edema bilateral distal LE, up to ankles  Pulmonary: +expiratory wheeze bilaterally; + course crackles bibasiliar   Abdomen: Soft, nontender with normoactive bowel sounds  Neuro: Alert and oriented x3, no focal deficits    Patient Lines/Drains/Airways Status     Active Line/Drains/Airways     Name Placement date Placement time Site Days   Peripheral IV 10/16/23 20 G Right Antecubital 10/16/23  1506  Antecubital  4   Peripheral IV 10/19/23 20 G 2.5" Right;Upper Arm 10/19/23  2226  Arm  1   External Urinary Catheter 10/16/23  1858  --  4             ASSESSMENT/PLAN:  Assessment: Active Problems:   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   Plan:   #Acute on Chronic Hypoxic Respiratory Failure #Acute on Chronic HF Exacerbation #Acute COPD exacerbation  Presented with worsening  dyspnea and fatigue for 1 week with increasing oxygen requirement today. Initially placed on NRB, was saturating at 69%, then placed on BiPAP in ED. CXR showed mild pulmonary vascular congestion, with small bilateral layering pleural effusions. BNP >2000; VBG shows pH 7.3, bicarb 39.2, pCO2 73.2; lung exam notable for bilateral rales and bibasilar crackles. She also had 1+ pitting edema below mid-shin bilaterally. She was given one dose of IV lasix 40 mg in ED; she responded well with improved breathing. She has received total 3 doses of IV Lasix 80 mg since admission, with total of -910 urine output. Echo shows EF 50-55%, unchanged from previous 07/2023. Per lung exam today, she has diffuse crackles and diminished breath sounds notable anteriorly, concerned for COPD exacerbation. She is given one IV dose of methylprednisolone on 11/25 and started on IV Zithromax 3 days. Per evaluation 11/26, concerned about worsening HF; repeat CXR confirmed. She is given two more rounds of lasix 80 mg BID 11/26 with little output recorded. Per chart review, during her last hospitalization, she responded well to IV Lasix 120 mg, plan to increase current dose and evaluate how she responds. We will stop prednisone 11/27 as it could making her HF worse.    Plan:  - START IV Lasix 120 mg TID today  - Continue Yupelri, Budesonide and Atrovent, breathing treatments  - STOP Prednisone  - Continue IV azithromycin 500 mg IV for 3/3 days - BiPAP at bedtime, and as needed  - Daily weight checks with Strict Is & Os - PT/OT recommended Home health PT/OT    #AKI #Cardiorenal syndrome  Presented with Cr 1.13, elevated from 0.75 on 09/03/2023 likely pre-renal, in the setting of HF exacerbation. Cr continues to trend higher in the setting of high dose lasix; She also had concerns of straining while urinating in the ED. UA negative infection. Bladder scan on 11/24 showed 0 PVR. There is likely a component of cardiorenal syndrome that is  playing in role in her AKI in the setting of worsening HF exacerbation. We will continue to monitor Cr.    #Chest pain, noncardiac  #Elevated Troponin #Demand Ischemia  Reported chest pain for the past 4 days, muscular like that comes and goes, worsened with movement. Pain is reproducible upon palpation. Troponin elevated to 19, 24, low suspicion for ACS at this time. Likely demand ischemia in the setting of volume overload. Tylenol as needed for pain     #Hypomagnesemia  Improved.    Chronic Conditions:  Type 2 Diabetes with nonproliferative diabetic retinopathy: Increased it to Semglee 40 units with resistant sliding scale   HLD: Continue lipitor 80 mg daily and zetia 10 mg daily Paroxysmal A fib: Currently NSR, resumed Eliquis 5 mg BID HTN: Resumed home medication amlodipine 10 mg daily.  Best Practice: Diet: NPO IVF: Fluids: NONE, Rate: None VTE: Eliquis 5 mg BID Code: Full AB: Azith 3/3  Therapy Recs: None, DME: other none Family  Contact: Felicia  DISPO: Anticipated discharge  2-3 days  to Home pending  medical management .  Signature: Jeral Pinch, D.O.  Internal Medicine Resident, PGY-1 [facility]  Pager: [phone] 8:21 AM, 10/20/2023   Please contact the on call pager after 5 pm and on weekends at [phone].

## 2023-10-20 NOTE — Consult Note (Signed)
[facility] Liaison Consult Note    10/20/2023  Alicia [name] 10/23/1956 [id number]  [facility] Patient:  Active  Primary Care Provider:  [name], MD, [facility] Internal Medicine this provider is listed to provide the community transition of care follow up and [facility] calls  Patient discussed in unit progression meeting. Met with patient at bedside.  Nursing working with patient, patient endorses Nurse, children's CC involvement.  Insurance: [employer]  Patient is currently active with [employer] for care coordination services.  Patient has been engaged by a [employer]. The community based plan of care has focused on disease management and community resource support.    Plan: Continue to follow for any additional community care coordination needs for post hospital/community needs. Will follow up with Community RN CC for post hospital plan of care and needs.   Of note, [facility] services does not replace or interfere with any services that are needed or arranged by inpatient [facility] care management team.   Charlesetta Shanks, RN, BSN, CCM [place]  [facility], Population Health, [facility] Liaison Direct Dial: [phone] or secure chat Email: [email or web address]

## 2023-10-20 NOTE — Progress Notes (Addendum)
Pt requesting to be off Bipap at this time. RN placed 15L HFNC on patient per RT recommendation if off Bipap. Sats initially down to mid-70s. Very slow recovery into the mid-80s after roughly 10-15 mins in which RN told pt she needs to go back on Bipap but patient refusing stating to give her time on the HFNC and her sats will go up. WOB labored but patient adamant to stay on HFNC.   1007: Pt agreed to go back on Bipap after sats dipped again back into the 70s.  Bipap on at 60% with sats in lower 90s.   1320: Pt remains restless with the BiPAP, taking it off and on after educating patient multiple times on the importance of use. Pt had bipap on around noon and was napping and family arrived shortly after and during this time bipap was removed and patient was eating. Educated patient and family that bipap must stay on at this time due to patient WOB and not keeping sats >90 and that eating is not allowed while on Bipap d/t risk of aspiration. They acknowledged they already knew this information. Pt placed back on bipap and sats are currently in the low 90s. Will continue to educate.

## 2023-10-20 NOTE — Progress Notes (Signed)
Pt placed on BiPAP due to increase WOB. Pt states she wants to go back on BiPAP at this time. Pt is currently on 12/5 60% and is tolerating well.

## 2023-10-20 NOTE — Progress Notes (Signed)
   10/20/23 2018  BiPAP/CPAP/SIPAP  BiPAP/CPAP/SIPAP Pt Type Adult  BiPAP/CPAP/SIPAP SERVO  Mask Type Full face mask  Mask Size Medium  Set Rate 16 breaths/min  Respiratory Rate 29 breaths/min  IPAP 16 cmH20  EPAP 8 cmH2O  FiO2 (%) 60 %  Minute Ventilation 12.2  Leak 64  Peak Inspiratory Pressure (PIP) 17  Tidal Volume (Vt) 395  Patient Home Equipment No  Auto Titrate No   PT RESTING COMFORTABLY ON BIPAP WHILE RECEIVING BREATHING TX.  RT WILL CONTINUE TO MONITOR

## 2023-10-20 NOTE — Progress Notes (Signed)
   10/20/23 0319  BiPAP/CPAP/SIPAP  $ Non-Invasive Ventilator  Non-Invasive Vent Subsequent  BiPAP/CPAP/SIPAP Pt Type Adult  BiPAP/CPAP/SIPAP SERVO  Mask Type Full face mask  Mask Size Medium  Set Rate 16 breaths/min  Respiratory Rate 28 breaths/min  IPAP 17 cmH20  EPAP 5 cmH2O  FiO2 (%) 60 %  Minute Ventilation 13.2  Leak 59  Peak Inspiratory Pressure (PIP) 17  Tidal Volume (Vt) 631  Patient Home Equipment No  Auto Titrate No  Press High Alarm 25 cmH2O  Press Low Alarm 5 cmH2O   RT called to bedisde as pt had desatted 50's while asleep on the nasal cannula.  RT entered room to find Pt back on bipap, tolerating well.  RT briefly increased fiO2 to 100% to increase saturations, and titrated back down to 60%.  Pt tolerated bipap well and states she feels better after having the bipap back on.  RT will continue to monitor

## 2023-10-20 NOTE — Progress Notes (Signed)
Went to evaluate patient at bedside.  Patient was still on BiPAP.  Was on BiPAP from 1 to 7 PM.  She states she is doing well.  Trialed off BiPAP, and patient was able to maintain oxygen greater than 90%.  Informed patient that she can stay off BiPAP for now, but will put back on at night.  Will continue to keep patient NPO.  Did talk to nursing staff and let them know that she can remain off BiPAP, but if she starts sustaining desaturations, will need to put patient back on BiPAP.  But for now, clinically patient looks stable.  Seems to be diuresing well as well.  Will sign out to night team to put patient back on BiPAP if needed.

## 2023-10-21 DIAGNOSIS — J9621 Acute and chronic respiratory failure with hypoxia: Secondary | ICD-10-CM | POA: Diagnosis not present

## 2023-10-21 DIAGNOSIS — J441 Chronic obstructive pulmonary disease with (acute) exacerbation: Secondary | ICD-10-CM | POA: Diagnosis not present

## 2023-10-21 DIAGNOSIS — Z87891 Personal history of nicotine dependence: Secondary | ICD-10-CM | POA: Diagnosis not present

## 2023-10-21 DIAGNOSIS — I509 Heart failure, unspecified: Secondary | ICD-10-CM | POA: Diagnosis not present

## 2023-10-21 LAB — GLUCOSE, CAPILLARY
Glucose-Capillary: 73 mg/dL (ref 70–99)
Glucose-Capillary: 98 mg/dL (ref 70–99)
Glucose-Capillary: 79 mg/dL (ref 70–99)
Glucose-Capillary: 59 mg/dL — ABNORMAL LOW (ref 70–99)
Glucose-Capillary: 148 mg/dL — ABNORMAL HIGH (ref 70–99)
Glucose-Capillary: 189 mg/dL — ABNORMAL HIGH (ref 70–99)
Glucose-Capillary: 220 mg/dL — ABNORMAL HIGH (ref 70–99)

## 2023-10-21 LAB — BASIC METABOLIC PANEL
Anion gap: 7 (ref 5–15)
BUN: 56 mg/dL — ABNORMAL HIGH (ref 8–23)
CO2: 38 mmol/L — ABNORMAL HIGH (ref 22–32)
Calcium: 9.1 mg/dL (ref 8.9–10.3)
Chloride: 93 mmol/L — ABNORMAL LOW (ref 98–111)
Creatinine, Ser: 1.08 mg/dL — ABNORMAL HIGH (ref 0.44–1.00)
GFR, Estimated: 57 mL/min — ABNORMAL LOW (ref >60)
Glucose, Bld: 59 mg/dL — ABNORMAL LOW (ref 70–99)
Potassium: 4 mmol/L (ref 3.5–5.1)
Sodium: 138 mmol/L (ref 135–145)

## 2023-10-21 LAB — MAGNESIUM: Magnesium: 2 mg/dL (ref 1.7–2.4)

## 2023-10-21 MED ORDER — DEXTROSE 50 % IV SOLN
50.0000 mL | Freq: Once | INTRAVENOUS | Status: AC
  Administered 2023-10-21: 50 mL via INTRAVENOUS
  Filled 2023-10-21: qty 50

## 2023-10-21 MED ORDER — INSULIN ASPART 100 UNIT/ML IJ SOLN
0.0000 [IU] | Freq: Three times a day (TID) | INTRAMUSCULAR | Status: DC
  Administered 2023-10-21: 3 [IU] via SUBCUTANEOUS
  Administered 2023-10-22: 11 [IU] via SUBCUTANEOUS
  Administered 2023-10-22: 8 [IU] via SUBCUTANEOUS
  Administered 2023-10-23: 3 [IU] via SUBCUTANEOUS
  Administered 2023-10-23: 11 [IU] via SUBCUTANEOUS
  Administered 2023-10-24: 3 [IU] via SUBCUTANEOUS
  Administered 2023-10-24: 8 [IU] via SUBCUTANEOUS
  Administered 2023-10-25: 5 [IU] via SUBCUTANEOUS
  Administered 2023-10-25 (×2): 3 [IU] via SUBCUTANEOUS
  Administered 2023-10-26: 2 [IU] via SUBCUTANEOUS
  Administered 2023-10-27 – 2023-10-28 (×2): 3 [IU] via SUBCUTANEOUS
  Administered 2023-10-28: 8 [IU] via SUBCUTANEOUS
  Administered 2023-10-29 (×2): 3 [IU] via SUBCUTANEOUS
  Administered 2023-10-31: 2 [IU] via SUBCUTANEOUS

## 2023-10-21 MED ORDER — INSULIN GLARGINE-YFGN 100 UNIT/ML ~~LOC~~ SOLN: 35.0000 [IU] | Freq: Every day | SUBCUTANEOUS | Status: DC

## 2023-10-21 MED ORDER — STERILE WATER FOR INJECTION IJ SOLN
INTRAMUSCULAR | Status: AC
  Administered 2023-10-21: 5 mL
  Filled 2023-10-21: qty 10

## 2023-10-21 MED ORDER — DEXTROSE 50 % IV SOLN
12.5000 g | INTRAVENOUS | Status: AC
  Administered 2023-10-21: 12.5 g via INTRAVENOUS
  Filled 2023-10-21: qty 50

## 2023-10-21 MED ORDER — ACETAZOLAMIDE SODIUM 500 MG IJ SOLR
250.0000 mg | Freq: Once | INTRAMUSCULAR | Status: AC
  Administered 2023-10-21: 250 mg via INTRAVENOUS
  Filled 2023-10-21: qty 250

## 2023-10-21 MED ORDER — INSULIN GLARGINE-YFGN 100 UNIT/ML ~~LOC~~ SOLN
30.0000 [IU] | Freq: Every day | SUBCUTANEOUS | Status: DC
  Administered 2023-10-21 – 2023-10-30 (×9): 30 [IU] via SUBCUTANEOUS
  Filled 2023-10-21 (×11): qty 0.3

## 2023-10-21 MED ORDER — FUROSEMIDE 10 MG/ML IJ SOLN
120.0000 mg | Freq: Two times a day (BID) | INTRAVENOUS | Status: AC
  Administered 2023-10-21 (×2): 120 mg via INTRAVENOUS
  Filled 2023-10-21 (×2): qty 2

## 2023-10-21 NOTE — Progress Notes (Signed)
RT at bedside this AM for morning breathing tx. RT removed pt from BiPAP (servo) and placed pt on 12L salter. Pt's SpO2 read 75%, RT increased salter to 15L w/improvement to SpO2 at 90%. Pt in no distress at this time. BiPAP on stby at bedside. MD notified of decrease in SpO2. Order was changed for oxygen greater than 88%. RN notified as well.

## 2023-10-21 NOTE — Progress Notes (Signed)
HD#5 SUBJECTIVE:  Patient Summary: Alicia [name] is a 67 y.o. 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   Overnight Events: Reports that she wore her BiPAP the whole night   Interim History: Patient is evaluated at bedside this morning, she was wearing her BiPAP, respiratory therapist in the room.  She reported that her breathing is much better, she is not wheezing anymore.  She reported that she is hungry she wants to eat food.  She denied any nausea, vomiting, abdominal pain.  No other concerns this morning.  OBJECTIVE:  Vital Signs: Vitals:   10/20/23 2345 10/21/23 0357 10/21/23 0740 10/21/23 0840  BP: (!) 144/63 (!) 146/78  132/61  Pulse: (!) 54 (!) 55 62 61  Resp: 20 19 (!) 26 20  Temp: 98 F (36.7 C) 97.8 F (36.6 C)  97.8 F (36.6 C)  TempSrc: Axillary Axillary  Oral  SpO2: 96% 95% 96% 94%  Weight:  103.6 kg    Height:       Supplemental O2: HFNC SpO2: 94 % O2 Flow Rate (L/min): 15 L/min FiO2 (%): (S) 40 %  Filed Weights   10/20/23 0500 10/20/23 0900 10/21/23 0357  Weight: 77.9 kg 105 kg 103.6 kg     Intake/Output Summary (Last 24 hours) at 10/21/2023 1049 Last data filed at 10/20/2023 2344 Gross per 24 hour  Intake 100 ml  Output 2150 ml  Net -2050 ml   Net IO Since Admission: -3,894 mL [10/21/23 1049]  Physical Exam: Physical Exam General: Sitting up in bed, no acute distress Head: Normocephalic, atraumatic, BiPAP on Cardio: Regular rate and rhythm, no murmurs, rubs or gallops. 1+ pitting edema bilateral distal LE Pulmonary:  +course crackles bibasilar, no wheezing. 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   Plan:   #Acute on Chronic Hypoxic Respiratory Failure #Acute on Chronic HF Exacerbation #Acute COPD exacerbation  Presented with worsening dyspnea and fatigue for 1 week with increasing oxygen requirement today. Initially placed on NRB, was saturating at 69%, then placed  on BiPAP in ED. CXR showed mild pulmonary vascular congestion, with small bilateral layering pleural effusions. BNP >2000; VBG shows pH 7.3, bicarb 39.2, pCO2 73.2; lung exam notable for bilateral rales and bibasilar crackles. She also had 1+ pitting edema below mid-shin bilaterally. She was given one dose of IV lasix 40 mg in ED; she responded well with improved breathing. She has received total 3 doses of IV Lasix 80 mg since admission, with total of -910 urine output. Echo shows EF 50-55%, unchanged from previous 07/2023. Per lung exam today, she has diffuse crackles and diminished breath sounds notable anteriorly, concerned for COPD exacerbation. She is given one IV dose of methylprednisolone on 11/25 and started on IV Zithromax 3 days. Per evaluation 11/26, concerned about worsening HF; repeat CXR confirmed. She is given two more rounds of lasix 80 mg BID 11/26 with little output recorded. Per chart review, during her last hospitalization, she responded well to IV Lasix 120 mg.  We gave her IV Lasix 120 mg 3 times daily on 11/27, stopped her prednisone, she responded well with ~3 L output.  She finished her azithromycin 3-day course. Upon evaluation today, plan to give her IV Lasix 120 mg twice today and Diamox to help with her contraction alkalosis.  Will continue to monitor her output.  Otherwise per her lung exam today, there is some coarse crackles that is apparent in her bilateral bibasilar lungs, but no wheezing.   Plan:  - IV Lasix 120 mg twice daily today - One-time dose of IV Diamox 250 mg today for contraction alkalosis  - Continue Yupelri, Budesonide and Atrovent, breathing treatments  - STOP Prednisone  - BiPAP at bedtime, and as needed  - Daily weight checks with Strict Is & Os - PT/OT recommended Home health PT/OT    #AKI #Cardiorenal syndrome  Presented with Cr 1.13, elevated from 0.75 on 09/03/2023 likely pre-renal, in the setting of HF exacerbation. Cr continues to trend higher in  the setting of high dose lasix; She also had concerns of straining while urinating in the ED. UA negative infection. Bladder scan on 11/24 showed 0 PVR. There is likely a component of cardiorenal syndrome that is playing in role in her AKI in the setting of worsening HF exacerbation. Cr improved today.    #Chest pain, noncardiac  #Elevated Troponin #Demand Ischemia  Reported chest pain for the past 4 days, muscular like that comes and goes, worsened with movement. Pain is reproducible upon palpation. Troponin elevated to 19, 24, low suspicion for ACS at this time. Likely demand ischemia in the setting of volume overload. Tylenol as needed for pain     #Hypomagnesemia  Improved.    Chronic Conditions:  Type 2 Diabetes with nonproliferative diabetic retinopathy: Semglee 30 units with moderate SSI HLD: Continue lipitor 80 mg daily and zetia 10 mg daily Paroxysmal A fib: Currently NSR, resumed Eliquis 5 mg BID HTN: Resumed home medication amlodipine 10 mg daily.  Best Practice: Diet: NPO with BiPAP IVF: Fluids: None, Rate: None VTE: Eliquis  5 mg BID Code: Full AB: None  Therapy Recs: Pending, DME: other pending DISPO: Anticipated discharge  2-3 days  to Home pending  medical management .  Signature: Jeral Pinch, D.O.  Internal Medicine Resident, PGY-1 [facility]  Pager: [phone] 10:49 AM, 10/21/2023   Please contact the on call pager after 5 pm and on weekends at [phone].

## 2023-10-21 NOTE — Progress Notes (Signed)
Evaluated the patient at bedside, She was back on BiPAP, comfortable, saturating above > 90%; Spoke with RN, reported that the patient was desatting in 64s with HFNC 15 L, she was switched back on BiPAP at ~ 9:45 AM. Patient is requesting food and water, will do a trial off of BiPAP so she can eat. Will place an order for diet.   Note - ON BIPAP, NPO - Off BiPAP, evaluate for nausea and risk for aspiration, prior to giving food/water.

## 2023-10-21 NOTE — Progress Notes (Signed)
Placed patient on bipap for the night

## 2023-10-21 NOTE — Plan of Care (Signed)
  Problem: Education: Goal: Ability to describe self-care measures that may prevent or decrease complications (Diabetes Survival Skills Education) will improve Outcome: Progressing Goal: Individualized Educational Video(s) Outcome: Progressing   Problem: Coping: Goal: Ability to adjust to condition or change in health will improve Outcome: Progressing   Problem: Fluid Volume: Goal: Ability to maintain a balanced intake and output will improve Outcome: Progressing   Problem: Health Behavior/Discharge Planning: Goal: Ability to identify and utilize available resources and services will improve Outcome: Progressing Goal: Ability to manage health-related needs will improve Outcome: Progressing   Problem: Metabolic: Goal: Ability to maintain appropriate glucose levels will improve Outcome: Progressing   Problem: Nutritional: Goal: Maintenance of adequate nutrition will improve Outcome: Progressing Goal: Progress toward achieving an optimal weight will improve Outcome: Progressing   Problem: Skin Integrity: Goal: Risk for impaired skin integrity will decrease Outcome: Progressing   Problem: Tissue Perfusion: Goal: Adequacy of tissue perfusion will improve Outcome: Progressing   Problem: Education: Goal: Knowledge of General Education information will improve Description: Including pain rating scale, medication(s)/side effects and non-pharmacologic comfort measures Outcome: Progressing   Problem: Health Behavior/Discharge Planning: Goal: Ability to manage health-related needs will improve Outcome: Progressing   Problem: Clinical Measurements: Goal: Ability to maintain clinical measurements within normal limits will improve Outcome: Progressing Goal: Will remain free from infection Outcome: Progressing Goal: Diagnostic test results will improve Outcome: Progressing Goal: Respiratory complications will improve Outcome: Progressing Goal: Cardiovascular complication will  be avoided Outcome: Progressing   Problem: Activity: Goal: Risk for activity intolerance will decrease Outcome: Progressing   Problem: Nutrition: Goal: Adequate nutrition will be maintained Outcome: Progressing   Problem: Coping: Goal: Level of anxiety will decrease Outcome: Progressing   Problem: Elimination: Goal: Will not experience complications related to bowel motility Outcome: Progressing Goal: Will not experience complications related to urinary retention Outcome: Progressing   Problem: Pain Management: Goal: General experience of comfort will improve Outcome: Progressing   Problem: Safety: Goal: Ability to remain free from injury will improve Outcome: Progressing   Problem: Skin Integrity: Goal: Risk for impaired skin integrity will decrease Outcome: Progressing   Problem: Education: Goal: Knowledge of disease or condition will improve Outcome: Progressing Goal: Knowledge of the prescribed therapeutic regimen will improve Outcome: Progressing Goal: Individualized Educational Video(s) Outcome: Progressing   Problem: Activity: Goal: Ability to tolerate increased activity will improve Outcome: Progressing Goal: Will verbalize the importance of balancing activity with adequate rest periods Outcome: Progressing   Problem: Respiratory: Goal: Ability to maintain a clear airway will improve Outcome: Progressing Goal: Levels of oxygenation will improve Outcome: Progressing Goal: Ability to maintain adequate ventilation will improve Outcome: Progressing   Problem: Education: Goal: Ability to demonstrate management of disease process will improve Outcome: Progressing Goal: Ability to verbalize understanding of medication therapies will improve Outcome: Progressing Goal: Individualized Educational Video(s) Outcome: Progressing   Problem: Activity: Goal: Capacity to carry out activities will improve Outcome: Progressing   Problem: Cardiac: Goal: Ability  to achieve and maintain adequate cardiopulmonary perfusion will improve Outcome: Progressing

## 2023-10-21 NOTE — Progress Notes (Signed)
Ate 80% supper.Maintaining Sats on 90's @15L  HFNC. Pt comfortable on bed.

## 2023-10-21 NOTE — Progress Notes (Signed)
ON 15LHF Sats on high 70's low 80s. Bipap placed, RT made aware gave instructions to increase FiO2 to 60%, Sats maintaining on 94%.-96%

## 2023-10-21 NOTE — Progress Notes (Signed)
RN called RT at 0945 stating she had placed pt back on BiPAP due to SpO2 reading 75%. RT came to bedside to assess pt. RT found pt on BiPAP and in no distress at this time. SpO2 reading 96% at this time on FiO2 of 60%.

## 2023-10-21 NOTE — Progress Notes (Signed)
RT at bedside to assess pt. RT found pt on 15L salter with BiPAP on stby at bedside. Pt in no distress, pt currently sleeping at this time. VSS.

## 2023-10-21 NOTE — Progress Notes (Signed)
While reviewing patient's labs, it showed a blood glucose of 59 which lab never called to report a critical on. Patient is asymptomatic. Performed a bedside glucose and it read at 73. I placed the hypoglycemic protocol in and contacted MD for further instructions.

## 2023-10-22 LAB — BASIC METABOLIC PANEL
Anion gap: 7 (ref 5–15)
BUN: 53 mg/dL — ABNORMAL HIGH (ref 8–23)
CO2: 37 mmol/L — ABNORMAL HIGH (ref 22–32)
Calcium: 8.7 mg/dL — ABNORMAL LOW (ref 8.9–10.3)
Chloride: 95 mmol/L — ABNORMAL LOW (ref 98–111)
Creatinine, Ser: 1.29 mg/dL — ABNORMAL HIGH (ref 0.44–1.00)
GFR, Estimated: 46 mL/min — ABNORMAL LOW (ref >60)
Glucose, Bld: 156 mg/dL — ABNORMAL HIGH (ref 70–99)
Potassium: 4.4 mmol/L (ref 3.5–5.1)
Sodium: 139 mmol/L (ref 135–145)

## 2023-10-22 LAB — GLUCOSE, CAPILLARY
Glucose-Capillary: 94 mg/dL (ref 70–99)
Glucose-Capillary: 279 mg/dL — ABNORMAL HIGH (ref 70–99)
Glucose-Capillary: 317 mg/dL — ABNORMAL HIGH (ref 70–99)
Glucose-Capillary: 239 mg/dL — ABNORMAL HIGH (ref 70–99)

## 2023-10-22 LAB — MAGNESIUM: Magnesium: 2 mg/dL (ref 1.7–2.4)

## 2023-10-22 MED ORDER — FUROSEMIDE 10 MG/ML IJ SOLN
120.0000 mg | Freq: Two times a day (BID) | INTRAVENOUS | Status: AC
  Administered 2023-10-22 (×2): 120 mg via INTRAVENOUS
  Filled 2023-10-22: qty 2
  Filled 2023-10-22: qty 10

## 2023-10-22 MED ORDER — STERILE WATER FOR INJECTION IJ SOLN
INTRAMUSCULAR | Status: AC
  Administered 2023-10-22: 5 mL
  Filled 2023-10-22: qty 10

## 2023-10-22 MED ORDER — PANTOPRAZOLE SODIUM 40 MG PO TBEC
40.0000 mg | DELAYED_RELEASE_TABLET | Freq: Every day | ORAL | Status: DC
  Administered 2023-10-22 – 2023-10-30 (×9): 40 mg via ORAL
  Filled 2023-10-22 (×9): qty 1

## 2023-10-22 MED ORDER — CALCIUM CARBONATE ANTACID 500 MG PO CHEW
1.0000 | CHEWABLE_TABLET | Freq: Every day | ORAL | Status: DC
  Administered 2023-10-22 – 2023-10-29 (×5): 200 mg via ORAL
  Filled 2023-10-22 (×8): qty 1

## 2023-10-22 MED ORDER — ACETAZOLAMIDE SODIUM 500 MG IJ SOLR
250.0000 mg | Freq: Once | INTRAMUSCULAR | Status: AC
  Administered 2023-10-22: 250 mg via INTRAVENOUS
  Filled 2023-10-22: qty 250

## 2023-10-22 NOTE — Progress Notes (Signed)
Pt placed on 40L 100% Heated High Flow Nasal Cannula and is tolerating well at this time.

## 2023-10-22 NOTE — Progress Notes (Signed)
HD#6 SUBJECTIVE:  Patient Summary: Alicia [name] is a 67 y.o. 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    Overnight Events: None   Interim History: Patient is evaluated bedside this morning, she is wearing her BiPAP, reports that she is doing well this morning.  She has no other concerns this morning she denies any abdominal pain, nausea, vomiting. She was able to eat yesterday when she was off of BiPAP.    OBJECTIVE:  Vital Signs: Vitals:   10/22/23 0311 10/22/23 0500 10/22/23 0529 10/22/23 0827  BP:      Pulse:      Resp:    (!) 24  Temp:   98.2 F (36.8 C)   TempSrc:   Oral   SpO2: 97%  90%   Weight:  100 kg    Height:       Supplemental O2:  HHFNC SpO2: 90 % O2 Flow Rate (L/min): 13 L/min FiO2 (%): 60 %  Filed Weights   10/20/23 0900 10/21/23 0357 10/22/23 0500  Weight: 105 kg 103.6 kg 100 kg     Intake/Output Summary (Last 24 hours) at 10/22/2023 0950 Last data filed at 10/21/2023 2100 Gross per 24 hour  Intake 760 ml  Output --  Net 760 ml   Net IO Since Admission: -3,134 mL [10/22/23 0950]  Physical Exam: Physical Exam  General: Sitting up in bed, no acute distress Head: Normocephalic, atraumatic, BiPAP on Cardio: Regular rate and rhythm, no murmurs, rubs or gallops. NO pitting edema bilateral LE  Pulmonary: Improved airflow in the lung fields, no wheezing or crackles  Abdomen: Soft, nontender with normoactive bowel sounds  Neuro: Alert and oriented x3, no focal deficits   Patient Lines/Drains/Airways Status     Active  Line/Drains/Airways     Name Placement date Placement time Site Days   Peripheral IV 10/19/23 20 G 2.5" Right;Upper Arm 10/19/23  2226  Arm  3   External Urinary Catheter 10/16/23  1858  --  6   Wound / Incision (Open or Dehisced) 10/20/23 (IAD) Incontinence Associated Dermatitis Buttocks Right;Left 10/20/23  1044  Buttocks  2             ASSESSMENT/PLAN:  Assessment: Active Problems:   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   Plan:   #Acute on Chronic Hypoxic Respiratory Failure #Acute on Chronic HF Exacerbation #Acute COPD exacerbation  Presented with worsening dyspnea and fatigue for 1 week with increasing oxygen requirement today. Initially placed on NRB, was saturating at 69%, then placed on BiPAP in ED. CXR showed mild pulmonary vascular congestion, with small bilateral layering pleural effusions. BNP >2000; VBG shows pH 7.3, bicarb 39.2, pCO2 73.2; lung exam notable for bilateral rales and bibasilar crackles. She also had 1+ pitting  edema below mid-shin bilaterally. She was given one dose of IV lasix 40 mg in ED; she responded well with improved breathing. She has received total 3 doses of IV Lasix 80 mg since admission, with total of -910 urine output. Echo shows EF 50-55%, unchanged from previous 07/2023. Per lung exam today, she has diffuse crackles and diminished breath sounds notable anteriorly, concerned for COPD exacerbation. She is given one IV dose of methylprednisolone on 11/25 and started on IV Zithromax 3 days. Per evaluation 11/26, concerned about worsening HF; repeat CXR confirmed. She is given two more rounds of lasix 80 mg BID 11/26 with little output recorded. Per chart review, during her last hospitalization, she responded well to IV Lasix 120 mg.  We gave her IV Lasix 120 mg 3 times daily on 11/27, stopped her prednisone, she responded well with ~3 L output.  She finished her azithromycin 3-day course. 11/28 she is given two doses of IV lasix 120mg  + 250 mg Diamox to help with her contraction alkalosis.   Per evaluation today, no wheezing or crackles noted. No lower extremity edema bilaterally.  Plan to do IV Lasix 120 mg twice daily today with one-time dose of IV Diamox to 50 mg for contraction alkalosis.  Per RT, patient is not able to maintain oxygen saturation above 88% when she is on high flow nasal cannula, will plan to switch her to [facility] and see how she tolerates.   Plan:  - Transition to HHFNC, goal O2 > 88% - IV Lasix 120 mg twice daily today - One-time dose of IV Diamox 250 mg today for contraction alkalosis  - Continue Yupelri, Budesonide and Atrovent, breathing treatments  - BiPAP at bedtime, NPO when on BiPAP - Daily weight checks with Strict Is & Os - PT/OT recommended Home health PT/OT    #AKI #Contraction Alkalosis  #Cardiorenal syndrome  Presented with Cr 1.13, elevated from 0.75 on 09/03/2023 likely pre-renal, in the setting of HF exacerbation. Cr continues to trend higher in the setting of  high dose lasix; She also had concerns of straining while urinating in the ED. UA negative infection. Bladder scan on 11/24 showed 0 PVR. There is likely a component of cardiorenal syndrome that is playing in role in her AKI in the setting of worsening HF exacerbation.  Will do a trial of one-time dose of IV Diamox to 50 mg today for contraction alkalosis. - Continue to trent BMP   #Goals of care conversation Initially we talked to the patient about palliative in the setting of her multiple hospitalization this year, end stage diseases that are affecting the quality of her life. She was against the idea and mentioned that she wants to continue fighting. Today, she brought up the topic of her end stage diseases, HF and COPD, and wanted to have a family meeting to discuss goals of care. I have called her daughter Artist [place], she did not pick up. Plan is to set up a family  meeting with the patient and talk about palliative care and figure out what the patient values: quality vs quantity of life.    #Chest pain, noncardiac  #Elevated Troponin #Demand Ischemia  Reported chest pain for the past 4 days, muscular like that comes and goes, worsened with movement. Pain is reproducible upon palpation. Troponin elevated to 19, 24, low suspicion for ACS at this time. Likely demand ischemia in the setting of volume overload. Tylenol as needed for pain     #Hypomagnesemia  Resolved.    Chronic Conditions:  Type 2 Diabetes with nonproliferative diabetic retinopathy: Semglee 30 units with moderate SSI HLD: Continue lipitor 80 mg daily and zetia 10 mg daily Paroxysmal A fib: Currently NSR, resumed Eliquis 5 mg BID HTN: Resumed home medication amlodipine 10 mg daily.   Best Practice: Diet: Cardiac diet IVF: Fluids: None, Rate: None VTE: Eliquis 5 mg twice daily Code: Full AB: None Therapy Recs: Pending, DME: none Family Contact: Felicia, called. Did not pick up.  DISPO: Anticipated discharge  2-3 days  to   pending  pending New oxygen.  Signature: Jeral Pinch, D.O.  Internal Medicine Resident, PGY-1 [facility]  Pager: [phone] 9:50 AM, 10/22/2023   Please contact the on call pager after 5 pm and on weekends at [phone].

## 2023-10-22 NOTE — Progress Notes (Signed)
PT Cancellation Note  Patient Details Name: [name] MRN: [id number] DOB: 04-09-1956   Cancelled Treatment:    Reason Eval/Treat Not Completed: Other (comment) Attempted to work with patient, she was working with other skilled clinical staff- will attempt to return if time/schedule allow   Nedra Hai, PT, DPT 10/22/23 11:41 AM

## 2023-10-22 NOTE — Progress Notes (Signed)
Mobility Specialist Progress Note:   10/22/23 1025  Therapy Vitals  Resp (!) 26  Patient Position (if appropriate) Sitting  Oxygen Therapy  O2 Device HFNC (salter)  O2 Flow Rate (L/min) 15 L/min  Mobility  Activity Transferred from bed to chair  Level of Assistance Contact guard assist, steadying assist  Assistive Device Other (Comment) (HHA)  Distance Ambulated (ft) 3 ft  Activity Response Tolerated well  Mobility Referral Yes  $Mobility charge 1 Mobility  Mobility Specialist Start Time (ACUTE ONLY) 1000  Mobility Specialist Stop Time (ACUTE ONLY) 1014  Mobility Specialist Time Calculation (min) (ACUTE ONLY) 14 min   Pt received in bed agreeable to mobility. Pt requested to get to the chair d/t her linen being wet. Upon getting to EOB SPO2 dropped to 78% needing a break, encouraged to do purse lip breathing. After seated break for 5 mins and changing pulse ox, SPO2 reached 89%. Able to transfer to the chair w/o fault. Call bell and personal belongings in reach. Left on 15L/min, RN in room.  Pre Mobility 15L/min SPO2 86% During Mobility 15L/min SPO2 78%-89% Post Mobility 15L/min SPO2 89%  Thompson Grayer Mobility Specialist  Please contact vis Secure Chat or  Rehab Office [phone]

## 2023-10-22 NOTE — Progress Notes (Signed)
PT Cancellation Note  Patient Details Name: [name] MRN: [id number] DOB: December 28, 1955   Cancelled Treatment:    Reason Eval/Treat Not Completed: Other (comment) eating lunch on 2nd PT attempt. Will attempt to return if time/schedule allow, otherwise will attempt on next DOS.   Nedra Hai, PT, DPT 10/22/23 11:57 AM

## 2023-10-22 NOTE — TOC Progression Note (Signed)
Transition of Care [facility]) - Progression Note    Patient Details  Name: Alicia [name] MRN: [id number] Date of Birth: 1956-08-20  Transition of Care [facility]) CM/SW Contact  Kermit Balo, RN Phone Number: 10/22/2023, 11:10 AM  Clinical Narrative:     Pt on 12 L HFNC.  TOC following for d/c needs.   Expected Discharge Plan: Home w Home Health Services Barriers to Discharge: Continued Medical Work up  Expected Discharge Plan and Services In-house Referral: NA Discharge Planning Services: CM Consult Post Acute Care Choice: Resumption of Svcs/PTA Provider, Home Health Living arrangements for the past 2 months: Single Family Home                 DME Arranged: N/A DME Agency: NA       HH Arranged: PT, OT HH Agency: [facility] Date [facility] Agency Contacted: 10/18/23 Time HH Agency Contacted: 1759 Representative spoke with at [facility] Agency: Kandee Keen   Social Determinants of Health (SDOH) Interventions SDOH Screenings   Food Insecurity: No Food Insecurity (10/16/2023)  Housing: Patient Unable To Answer (10/16/2023)  Transportation Needs: Unmet Transportation Needs (10/16/2023)  Utilities: Not At Risk (10/16/2023)  Alcohol Screen: Low Risk  (10/18/2023)  Depression (PHQ2-9): Low Risk  (09/06/2023)  Financial Resource Strain: Low Risk  (10/18/2023)  Physical Activity: Inactive (05/19/2023)  Social Connections: Socially Isolated (05/19/2023)  Stress: No Stress Concern Present (05/19/2023)  Tobacco Use: Medium Risk (10/16/2023)    Readmission Risk Interventions    08/10/2023   12:29 PM 07/28/2023   11:44 AM  Readmission Risk Prevention Plan  Transportation Screening Complete Complete  Medication Review Oceanographer) Complete Complete  PCP or Specialist appointment within 3-5 days of discharge Complete   HRI or Home Care Consult Complete Complete  SW Recovery Care/Counseling Consult  Complete  Palliative Care Screening Not Applicable Not Applicable  Skilled Nursing  Facility Not Applicable Not Applicable

## 2023-10-22 NOTE — Progress Notes (Signed)
Patient wanted to be removed from bipap. Placed patient on 12lpm nasal cannula

## 2023-10-23 ENCOUNTER — Inpatient Hospital Stay (HOSPITAL_COMMUNITY): Payer: 59

## 2023-10-23 DIAGNOSIS — J9621 Acute and chronic respiratory failure with hypoxia: Secondary | ICD-10-CM | POA: Diagnosis not present

## 2023-10-23 DIAGNOSIS — J441 Chronic obstructive pulmonary disease with (acute) exacerbation: Secondary | ICD-10-CM | POA: Diagnosis not present

## 2023-10-23 DIAGNOSIS — I509 Heart failure, unspecified: Secondary | ICD-10-CM | POA: Diagnosis not present

## 2023-10-23 DIAGNOSIS — Z87891 Personal history of nicotine dependence: Secondary | ICD-10-CM | POA: Diagnosis not present

## 2023-10-23 LAB — BLOOD GAS, ARTERIAL
Acid-Base Excess: 9.8 mmol/L — ABNORMAL HIGH (ref 0.0–2.0)
Acid-Base Excess: 13.6 mmol/L — ABNORMAL HIGH (ref 0.0–2.0)
Acid-Base Excess: 13.5 mmol/L — ABNORMAL HIGH (ref 0.0–2.0)
Bicarbonate: 39.3 mmol/L — ABNORMAL HIGH (ref 20.0–28.0)
Bicarbonate: 43.2 mmol/L — ABNORMAL HIGH (ref 20.0–28.0)
Bicarbonate: 41.8 mmol/L — ABNORMAL HIGH (ref 20.0–28.0)
Drawn by: 550621
O2 Saturation: 93.7 %
O2 Saturation: 93.5 %
O2 Saturation: 95.3 %
Patient temperature: 37.1
Patient temperature: 37
Patient temperature: 36.7
pCO2 arterial: 78 mm[Hg] (ref 32–48)
pCO2 arterial: 80 mm[Hg] (ref 32–48)
pCO2 arterial: 68 mm[Hg] (ref 32–48)
pH, Arterial: 7.31 — ABNORMAL LOW (ref 7.35–7.45)
pH, Arterial: 7.34 — ABNORMAL LOW (ref 7.35–7.45)
pH, Arterial: 7.39 (ref 7.35–7.45)
pO2, Arterial: 69 mm[Hg] — ABNORMAL LOW (ref 83–108)
pO2, Arterial: 67 mm[Hg] — ABNORMAL LOW (ref 83–108)
pO2, Arterial: 68 mm[Hg] — ABNORMAL LOW (ref 83–108)

## 2023-10-23 LAB — BASIC METABOLIC PANEL
Anion gap: 7 (ref 5–15)
BUN: 53 mg/dL — ABNORMAL HIGH (ref 8–23)
CO2: 35 mmol/L — ABNORMAL HIGH (ref 22–32)
Calcium: 8.6 mg/dL — ABNORMAL LOW (ref 8.9–10.3)
Chloride: 95 mmol/L — ABNORMAL LOW (ref 98–111)
Creatinine, Ser: 1.17 mg/dL — ABNORMAL HIGH (ref 0.44–1.00)
GFR, Estimated: 52 mL/min — ABNORMAL LOW (ref >60)
Glucose, Bld: 181 mg/dL — ABNORMAL HIGH (ref 70–99)
Potassium: 3.9 mmol/L (ref 3.5–5.1)
Sodium: 137 mmol/L (ref 135–145)

## 2023-10-23 LAB — GLUCOSE, CAPILLARY
Glucose-Capillary: 168 mg/dL — ABNORMAL HIGH (ref 70–99)
Glucose-Capillary: 164 mg/dL — ABNORMAL HIGH (ref 70–99)
Glucose-Capillary: 321 mg/dL — ABNORMAL HIGH (ref 70–99)
Glucose-Capillary: 164 mg/dL — ABNORMAL HIGH (ref 70–99)
Glucose-Capillary: 91 mg/dL (ref 70–99)

## 2023-10-23 LAB — TROPONIN I (HIGH SENSITIVITY)
Troponin I (High Sensitivity): 45 ng/L — ABNORMAL HIGH (ref <18)
Troponin I (High Sensitivity): 46 ng/L — ABNORMAL HIGH (ref <18)

## 2023-10-23 LAB — MAGNESIUM: Magnesium: 2 mg/dL (ref 1.7–2.4)

## 2023-10-23 MED ORDER — ACETAZOLAMIDE SODIUM 500 MG IJ SOLR
250.0000 mg | Freq: Once | INTRAMUSCULAR | Status: AC
  Administered 2023-10-23: 250 mg via INTRAVENOUS
  Filled 2023-10-23: qty 250

## 2023-10-23 MED ORDER — FUROSEMIDE 10 MG/ML IJ SOLN
120.0000 mg | Freq: Two times a day (BID) | INTRAVENOUS | Status: DC
  Filled 2023-10-23: qty 12

## 2023-10-23 MED ORDER — POTASSIUM CHLORIDE CRYS ER 20 MEQ PO TBCR
20.0000 meq | EXTENDED_RELEASE_TABLET | Freq: Once | ORAL | Status: AC
  Administered 2023-10-23: 20 meq via ORAL
  Filled 2023-10-23: qty 1

## 2023-10-23 MED ORDER — FUROSEMIDE 10 MG/ML IJ SOLN
120.0000 mg | Freq: Three times a day (TID) | INTRAVENOUS | Status: AC
  Administered 2023-10-23 (×3): 120 mg via INTRAVENOUS
  Filled 2023-10-23 (×2): qty 10
  Filled 2023-10-23: qty 2

## 2023-10-23 MED ORDER — ONDANSETRON HCL 4 MG/2ML IJ SOLN
4.0000 mg | Freq: Four times a day (QID) | INTRAMUSCULAR | Status: DC | PRN
  Administered 2023-10-31 (×2): 4 mg via INTRAVENOUS
  Filled 2023-10-23 (×2): qty 2

## 2023-10-23 NOTE — Progress Notes (Signed)
Called to bedside because of concern for nausea by nurse. Per report patient in respiratory distress with tachycardia. BiPAP removed for nausea, brief period without oxygen. Rapid response RN and respiratory therapist called. She reports no difficulty breathing at present, but has upset stomach. No chest pain or abdominal pain. Upon my arrival she is on heated high flow, saturating 93%. A-fib on monitor with rate around 90-100. Strong radial pulse. No abdominal tenderness. Cardiorespiratory status currently stable. Okay to leave off BiPAP while she's nauseous. Treat symptomatically for now with ondansetron 4 mg via IV route as needed every 6 hours, up to three doses.  Marrianne Mood MD 10/23/2023, 3:21 AM

## 2023-10-23 NOTE — Code Documentation (Signed)
  Patient Name: Alicia [name]   MRN: [id number]   Date of Birth/ Sex: [birth year] , female      Admission Date: 10/16/2023  Attending Provider: [name], MD  Primary Diagnosis: Acute on Chronic Hypoxic Respiratory Failure   Indication: Pt was in her usual state of health until this AM, when she was noted to be hypoxic and bradycardic. Code blue was subsequently called. At the time of arrival on scene, ACLS protocol was underway. She received 1x dose of atropine with good HR response. She was already on HHF at 30 L and 80% FiO2 which was increased to 100%. Pt was alert and fully oriented without obvious focal deficits.    Technical Description:  - CPR performance duration:  Not performed, did not lose a pulse  - Was defibrillation or cardioversion used? No   - Was external pacer placed? Yes  - Was patient intubated pre/post CPR? No   Medications Administered: Y = Yes; Blank = No Amiodarone    Atropine  Y  Calcium    Epinephrine    Lidocaine    Magnesium    Norepinephrine    Phenylephrine    Sodium bicarbonate    Vasopressin     Post CPR evaluation:  - Final Status - Was patient successfully resuscitated ? Yes - What is current rhythm? Sinus  - What is current hemodynamic status? stable  Miscellaneous Information:  - Labs sent, including: Troponin, ABG, EKG, CXR  - Primary team notified?  Yes  - Family Notified? Yes  - Additional notes/ transfer status: Remain in progressive status     Rocky Morel, DO  10/23/2023, 10:44 AM

## 2023-10-23 NOTE — Progress Notes (Signed)
Patient taken off of BIPAP and placed on heated high flow 30L/100%. Patient currently tolerating well, patient will be placed back on BIPAP overnight. RT will continue to monitor.

## 2023-10-23 NOTE — Progress Notes (Signed)
   10/23/23 1103  Spiritual Encounters  Type of Visit Initial  Care provided to: Patient  Conversation partners present during encounter Nurse;Physician  Referral source Code page  Reason for visit Code  OnCall Visit Yes  Spiritual Framework  Presenting Themes Impactful experiences and emotions  Community/Connection Family  Patient Stress Factors Health changes  Family Stress Factors Not reviewed  Interventions  Spiritual Care Interventions Made Established relationship of care and support;Compassionate presence;Reflective listening;Normalization of emotions;Encouragement  Intervention Outcomes  Outcomes Connection to spiritual care;Awareness of support;Reduced fear  Spiritual Care Plan  Spiritual Care Issues Still Outstanding Chaplain will continue to follow  Recommendations for Clinical Staff Patient requested chaplain presence when her daughter arrives. Page please.  Follow up plan  Chaplain will return to patient's room when her daughter arrives   Chaplain responded to code blue. Chaplain provided the patient emotional and spiritual support. Physician spoke to patient about further interventions. Patient requested the chaplain to be there when she discusses her wishes with her daughter. Chaplain will respond as requested.   Arlyce Dice, Chaplain Resident [phone]

## 2023-10-23 NOTE — Progress Notes (Signed)
HD#7 SUBJECTIVE:  Patient Summary: Alicia [name] is a 67 y.o. 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    Overnight Events: Night team paged regarding nausea, BiPAP removed and noted drop in O2 saturation and became tachycardic, placed on HHFNC and given IV Zofran with stabilization and improvement   Interim History: Patient is evaluated bedside this morning, not feeling well due to overnight event.  Had nausea and SOB overnight but has improved.  No other new concerns.  Ate some of her breakfast this morning. Expressed frustration over repeated hospitalizations and continued diuresis. Discussed having family meeting with daughter early next week.     OBJECTIVE:  Vital Signs: Vitals:   10/22/23 1607 10/22/23 1933 10/22/23 1946 10/23/23 0120  BP: (!) 134/55  (!) 124/53 133/67  Pulse: 63 62 63 (!) 55  Resp: 19 (!) 35 20   Temp:   (!) 97.1 F (36.2 C) 98.9 F (37.2 C)  TempSrc:   Axillary Oral  SpO2: 93% 100% 100%   Weight:      Height:       Supplemental O2:  HHFNC SpO2: 97 % O2 Flow Rate (L/min): 30 L/min FiO2 (%): 80 %  Filed Weights   10/20/23 0900 10/21/23 0357 10/22/23 0500  Weight: 105 kg 103.6 kg 100 kg     Intake/Output Summary (Last 24 hours) at 10/23/2023 0557 Last data filed at 10/23/2023 0122 Gross per 24 hour  Intake --  Output 1250 ml  Net -1250 ml   Net IO Since Admission: -4,384 mL [10/23/23 0557]  Physical Exam: General: Chronically ill-appearing, sitting up in bed, no acute distress Head: Normocephalic, atraumatic, HFNC in  place  Cardio: RRR, trace/1+ LE edema Pulmonary: improved airflow, no signs of respiratory distress, on HFNC Neuro: awake and alert, no focal deficits noted  Patient Lines/Drains/Airways Status     Active Line/Drains/Airways     Name Placement date Placement time Site Days   Peripheral IV 10/19/23 20 G 2.5" Right;Upper Arm 10/19/23  2226  Arm  3   External Urinary Catheter 10/16/23  1858  --  6   Wound / Incision (Open or Dehisced) 10/20/23 (IAD) Incontinence Associated Dermatitis Buttocks Right;Left 10/20/23  1044  Buttocks  2             ASSESSMENT/PLAN:  Assessment: Active Problems:   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   Plan:   #Acute on Chronic Hypoxic Respiratory Failure (home O2 8L [name]) #Acute on Chronic HF Exacerbation (EF 50-55%) #Acute COPD exacerbation  Continue to diurese with output of 1.25 L yesterday.  Net negative 4 L with daily weights trending down.  Was on BiPAP overnight but switched to HHFNC due to overnight  episode of nausea. Renal function improved from yesterday and bicarb slightly down from 37 to 35 today. Pulmonary exam stable today.  -IV Lasix 120 mg twice daily -IV acetazolamide to 50 mg today -Maintain K >4 and Mag >2, replete as needed -Continue Yupelri, Budesonide and Atrovent, breathing treatments  -Maintain SpO2 goal of 88-92% -BiPAP at bedtime -daily weights -strict I&O -Continue PT and OT  #AKI #Contraction Alkalosis  #Cardiorenal syndrome  Adequate urine output yesterday with diuresis. Creatinine up to 1.29 yesterday but improved to 1.17 today.  Bicarb trend down to 35.  Will continue diuresis today. -Monitor renal function  #Goals of care conversation Initiated goals of care conversation during this hospitalization and patient initially wants to continue full scope of care.  This morning she expressed frustration over multiple hospitalizations and continued diuresis for her chronic conditions.  States she is interested in having a involving her and her daughter.  Will plan to have potential family meeting early next week.   #Chest pain, noncardiac  #Elevated Troponin #Demand Ischemia  Patient did not report any chest pain today.  Previous workup low suspicion for ACS at this time.  Suspect demand ischemia in setting of volume overload.  Tylenol as needed.    Chronic Conditions:  Type 2 Diabetes with nonproliferative diabetic retinopathy: Continue Semglee 30 units daily with moderate SSI HLD: Continue lipitor 80 mg daily and zetia 10 mg daily Paroxysmal A fib: Currently NSR, continue Eliquis 5 mg BID HTN: Continue home medication amlodipine 10 mg daily.   Best Practice: Diet: Cardiac diet IVF: Fluids: None, Rate: None VTE: Eliquis 5 mg twice daily Code: Full AB: None Therapy Recs: Home Health, DME: none Family Contact: Felicia (daughter), will try to call again  DISPO: Anticipated discharge  2-3 days  to  pending  pending New oxygen.  Signature: Rana Snare,  DO Internal Medicine Resident PGY-2 Pager: [phone] Please contact the on-call pager after 5 pm and on weekends at [phone]. 5:57 AM, 10/23/2023

## 2023-10-23 NOTE — Plan of Care (Signed)
Patient progressing on care plans; patient requiring transition to Bipap today after symptomatic bradycardia requiring atropine. Patient tolerating bipap well. Co2 levels continue to remain high. Patient and family educated on plan of care, all questions answered.

## 2023-10-23 NOTE — Progress Notes (Signed)
PT Cancellation Note  Patient Details Name: Alicia [name] MRN: [id number] DOB: [birth year]   Cancelled Treatment:    Reason Eval/Treat Not Completed: (P) Medical issues which prohibited therapy (RN defer, code recently called due to pt bradycardia, not medically appropriate for PT at this time.) Will continue efforts on Monday per PT plan of care as schedule permits.   Torsten Weniger M Kehinde Totzke 10/23/2023, 11:01 AM

## 2023-10-23 NOTE — Progress Notes (Signed)
ABG collected and sent down to lab

## 2023-10-23 NOTE — Significant Event (Signed)
Rapid Response Event Note   Reason for Call :  Hypoxia, tachycardia  Per RN, pt said she felt like she was going to vomit. RN took pt off bipap. While off of bipap, SpO2 dropped to  44%  and HR increased to 140s.   Initial Focused Assessment:  Pt sitting up in bed with eyes closed. PTA RRT, pt was placed back on bipap by RT, SpO2-97%. Pt said she was going to vomit again. Bipap was again removed. Pt placed on HHFNC 40L 75%. Lungs diminished with scattered crackles t/o. Skin warm to touch  HR-140S, RR-28, SpO2-95% on HHFNC 40L 75%.   Interventions:  HHFNC(already ordered) Zofran IV Plan of Care:  SpO2 and HR now normalized. With N/V present, HHFNC is safer option versus bipap.  Give Zofran for nausea. Continue to monitor pt. Please call RRT if further assistance needed.   Event Summary:   MD Notified: Dr. Benito Mccreedy notified and came to bedside Call Time:0255 Arrival Time:0258 End Time:0320  Terrilyn Saver, RN

## 2023-10-23 NOTE — Care Plan (Signed)
Family meeting today with patient and patient's daughter, Alicia [name].    Introduced my role as part of medical team to patient's daughter. Discussed current hospitalization and events overnight and this morning.   Social History: Patient has 3 children, two sons and one daughter. Currently living with her daughter. Daughter assists with patient's care along with support from her grandchildren. Daughter describes patient as stubborn but a very Chief Executive Officer and puts her full effort into doing things. Notes patient previously worked in [employer]. At home, patient tries to be independent with certain ADLs and IADLs. Loves to cook and tries to do chores around the house. Patient states her goal is to continue seeing her grandchildren grow and graduate/continue with eduction. Patient states she does not want to be a burden to her daughter. Provided empathy and support for patient and family.   Discussed code status and answered all related questions. Discussed DNR and patient's daughter stated they have had prior discussions between the two of them. Patient wishes to continue with all care possible at this time. Patient did consider changing code status but requested more time to reflect on it. Allowed time for patient and patient's daughter to reflect and process all the information.   Patient would like to have chaplain visit her while in hospital. Discussed with patient and patient's daughter the current medical care plan to continue diuresis and BiPAP along with monitoring her other chronic conditions.   Plan -Continue full scope of care -Consult to spiritual care/chaplain  -Ongoing discussion regarding code status with patient and patient's family -Current Code Status: FULL Code    Rana Snare, DO Internal Medicine Resident PGY-2 Pager: [phone] Please contact the on-call pager after 5 pm and on weekends at [phone].

## 2023-10-23 NOTE — Progress Notes (Signed)
At approximately 1035 central telemetry called to report patient was bradycardic to the 30's. Upon entering patient's room patient was unresponsive with a pulse. A code blue was called and atropine was given per ACLS protocol. HR improved. Patient received ABG; critical lab result of 78 for CO2 was noted and relayed to Dr. Geraldo Pitter. Patient placed back on bipap per orders.

## 2023-10-24 ENCOUNTER — Inpatient Hospital Stay (HOSPITAL_COMMUNITY): Payer: 59

## 2023-10-24 DIAGNOSIS — J9621 Acute and chronic respiratory failure with hypoxia: Secondary | ICD-10-CM | POA: Diagnosis not present

## 2023-10-24 DIAGNOSIS — J9622 Acute and chronic respiratory failure with hypercapnia: Secondary | ICD-10-CM | POA: Diagnosis not present

## 2023-10-24 DIAGNOSIS — J449 Chronic obstructive pulmonary disease, unspecified: Secondary | ICD-10-CM | POA: Diagnosis not present

## 2023-10-24 DIAGNOSIS — N179 Acute kidney failure, unspecified: Secondary | ICD-10-CM

## 2023-10-24 LAB — CBC
HCT: 33.9 % — ABNORMAL LOW (ref 36.0–46.0)
Hemoglobin: 9.9 g/dL — ABNORMAL LOW (ref 12.0–15.0)
MCH: 25.5 pg — ABNORMAL LOW (ref 26.0–34.0)
MCHC: 29.2 g/dL — ABNORMAL LOW (ref 30.0–36.0)
MCV: 87.4 fL (ref 80.0–100.0)
Platelets: 218 10*3/uL (ref 150–400)
RBC: 3.88 MIL/uL (ref 3.87–5.11)
RDW: 17.2 % — ABNORMAL HIGH (ref 11.5–15.5)
WBC: 7.9 10*3/uL (ref 4.0–10.5)
nRBC: 0 % (ref 0.0–0.2)

## 2023-10-24 LAB — GLUCOSE, CAPILLARY
Glucose-Capillary: 72 mg/dL (ref 70–99)
Glucose-Capillary: 166 mg/dL — ABNORMAL HIGH (ref 70–99)
Glucose-Capillary: 275 mg/dL — ABNORMAL HIGH (ref 70–99)
Glucose-Capillary: 222 mg/dL — ABNORMAL HIGH (ref 70–99)

## 2023-10-24 LAB — HEPATIC FUNCTION PANEL
ALT: 13 U/L (ref 0–44)
AST: 14 U/L — ABNORMAL LOW (ref 15–41)
Albumin: 2.7 g/dL — ABNORMAL LOW (ref 3.5–5.0)
Alkaline Phosphatase: 63 U/L (ref 38–126)
Bilirubin, Direct: <0.1 mg/dL (ref 0.0–0.2)
Total Bilirubin: 0.5 mg/dL (ref <1.2)
Total Protein: 5.8 g/dL — ABNORMAL LOW (ref 6.5–8.1)

## 2023-10-24 LAB — BRAIN NATRIURETIC PEPTIDE: B Natriuretic Peptide: 817.1 pg/mL — ABNORMAL HIGH (ref 0.0–100.0)

## 2023-10-24 LAB — BASIC METABOLIC PANEL
Anion gap: 9 (ref 5–15)
BUN: 52 mg/dL — ABNORMAL HIGH (ref 8–23)
CO2: 35 mmol/L — ABNORMAL HIGH (ref 22–32)
Calcium: 9.1 mg/dL (ref 8.9–10.3)
Chloride: 95 mmol/L — ABNORMAL LOW (ref 98–111)
Creatinine, Ser: 1.25 mg/dL — ABNORMAL HIGH (ref 0.44–1.00)
GFR, Estimated: 48 mL/min — ABNORMAL LOW (ref >60)
Glucose, Bld: 85 mg/dL (ref 70–99)
Potassium: 4.1 mmol/L (ref 3.5–5.1)
Sodium: 139 mmol/L (ref 135–145)

## 2023-10-24 LAB — SEDIMENTATION RATE: Sed Rate: 10 mm/h (ref 0–22)

## 2023-10-24 LAB — MRSA NEXT GEN BY PCR, NASAL: MRSA by PCR Next Gen: NOT DETECTED

## 2023-10-24 LAB — PROCALCITONIN: Procalcitonin: <0.1 ng/mL

## 2023-10-24 LAB — C-REACTIVE PROTEIN: CRP: 1.8 mg/dL — ABNORMAL HIGH (ref <1.0)

## 2023-10-24 MED ORDER — METOLAZONE 2.5 MG PO TABS: 2.5000 mg | ORAL_TABLET | Freq: Once | ORAL | Status: DC

## 2023-10-24 MED ORDER — BUDESONIDE 0.5 MG/2ML IN SUSP
0.5000 mg | Freq: Two times a day (BID) | RESPIRATORY_TRACT | Status: DC
  Administered 2023-10-24 – 2023-10-31 (×14): 0.5 mg via RESPIRATORY_TRACT
  Filled 2023-10-24 (×14): qty 2

## 2023-10-24 MED ORDER — FUROSEMIDE 10 MG/ML IJ SOLN
120.0000 mg | Freq: Three times a day (TID) | INTRAVENOUS | Status: AC
  Administered 2023-10-24 (×3): 120 mg via INTRAVENOUS
  Filled 2023-10-24 (×3): qty 10

## 2023-10-24 MED ORDER — METOLAZONE 5 MG PO TABS
5.0000 mg | ORAL_TABLET | Freq: Once | ORAL | Status: AC
  Administered 2023-10-24: 5 mg via ORAL
  Filled 2023-10-24: qty 1

## 2023-10-24 MED ORDER — ACETAZOLAMIDE SODIUM 500 MG IJ SOLR
250.0000 mg | Freq: Once | INTRAMUSCULAR | Status: AC
  Administered 2023-10-24: 250 mg via INTRAVENOUS
  Filled 2023-10-24: qty 250

## 2023-10-24 MED ORDER — REVEFENACIN 175 MCG/3ML IN SOLN
175.0000 ug | Freq: Every day | RESPIRATORY_TRACT | Status: DC
  Administered 2023-10-25 – 2023-10-31 (×7): 175 ug via RESPIRATORY_TRACT
  Filled 2023-10-24 (×7): qty 3

## 2023-10-24 MED ORDER — SODIUM CHLORIDE 0.9 % IV SOLN: INTRAVENOUS | Status: AC | PRN
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
## 2023-10-24 NOTE — Plan of Care (Signed)
  Problem: Coping: Goal: Ability to adjust to condition or change in health will improve 10/24/2023 0233 by Duffy Bruce, RN Outcome: Progressing 10/24/2023 0233 by Duffy Bruce, RN Outcome: Progressing   Problem: Fluid Volume: Goal: Ability to maintain a balanced intake and output will improve 10/24/2023 0233 by Duffy Bruce, RN Outcome: Progressing 10/24/2023 0233 by Duffy Bruce, RN Outcome: Progressing   Problem: Nutritional: Goal: Maintenance of adequate nutrition will improve 10/24/2023 0233 by Duffy Bruce, RN Outcome: Progressing 10/24/2023 0233 by Duffy Bruce, RN Outcome: Progressing   Problem: Nutritional: Goal: Progress toward achieving an optimal weight will improve 10/24/2023 0233 by Duffy Bruce, RN Outcome: Progressing 10/24/2023 0233 by Duffy Bruce, RN Outcome: Progressing   Problem: Skin Integrity: Goal: Risk for impaired skin integrity will decrease 10/24/2023 0233 by Duffy Bruce, RN Outcome: Progressing 10/24/2023 0233 by Duffy Bruce, RN Outcome: Progressing   Problem: Education: Goal: Knowledge of General Education information will improve Description: Including pain rating scale, medication(s)/side effects and non-pharmacologic comfort measures 10/24/2023 0233 by Duffy Bruce, RN Outcome: Progressing 10/24/2023 0233 by Duffy Bruce, RN Outcome: Progressing   Problem: Nutrition: Goal: Adequate nutrition will be maintained 10/24/2023 0233 by Duffy Bruce, RN Outcome: Progressing 10/24/2023 0233 by Duffy Bruce, RN Outcome: Progressing   Problem: Coping: Goal: Level of anxiety will decrease 10/24/2023 0233 by Duffy Bruce, RN Outcome: Progressing 10/24/2023 0233 by Duffy Bruce, RN Outcome: Progressing   Problem: Elimination: Goal: Will not experience complications related to bowel motility 10/24/2023 0233 by Duffy Bruce, RN Outcome: Progressing 10/24/2023 0233 by Duffy Bruce, RN Outcome:  Progressing Goal: Will not experience complications related to urinary retention 10/24/2023 0233 by Duffy Bruce, RN Outcome: Progressing 10/24/2023 0233 by Duffy Bruce, RN Outcome: Progressing   Problem: Pain Management: Goal: General experience of comfort will improve 10/24/2023 0233 by Duffy Bruce, RN Outcome: Progressing 10/24/2023 0233 by Duffy Bruce, RN Outcome: Progressing   Problem: Safety: Goal: Ability to remain free from injury will improve 10/24/2023 0233 by Duffy Bruce, RN Outcome: Progressing 10/24/2023 0233 by Duffy Bruce, RN Outcome: Progressing

## 2023-10-24 NOTE — Progress Notes (Signed)
HD#8 SUBJECTIVE:  Patient Summary: Alicia [name] is a 67 y.o. 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    Overnight Events: Taken off Bipap ~3:30 AM, placed on HHFNC   Interim History: Patient evaluated bedside this morning, she was on HHFNC on 30 L saturating above 96%, patient reported that she feels short of breath even with the current settings. States that after she had couple of bite to eat, she felt extremely short of breath and wanted to be placed back on BiPAP. She denied any recent fevers, chills, nausea, vomiting, abdominal pain. She did report that her pure wick catheter has been leaking since yesterday and she notified NT. Reports that the RN fixed it this morning.   RN messaged at 09:46 AM reporting that patient desatted to mid 70s for about 5 minutes while on BiPAP 15L and 60%. RT notified, instructed to turn Bipap to 100% Oxygen, saturating at 91 - 94%.   OBJECTIVE:  Vital Signs: Vitals:   10/24/23 0328 10/24/23 0555 10/24/23 0831 10/24/23 1103  BP:  (!) 140/58 120/60 129/61  Pulse: (!) 50 (!) 51 (!) 58 (!) 54  Resp: (!) 24 20 17 15   Temp:  98.3 F (36.8 C) 97.9 F (36.6 C) 99.1 F (37.3 C)  TempSrc:  Oral Axillary Axillary  SpO2: 97% 95% 93% 99%  Weight:  99.7 kg    Height:       Supplemental O2:  BiPAP  SpO2: 99 % O2 Flow Rate (L/min): 40 L/min FiO2 (%): 80 %  Filed Weights   10/22/23 0500 10/23/23 0611 10/24/23 0555  Weight: 100 kg 101.3 kg 99.7 kg     Intake/Output Summary (Last 24 hours) at 10/24/2023 1128 Last data filed at  10/24/2023 8119 Gross per 24 hour  Intake 904 ml  Output 725 ml  Net 179 ml   Net IO Since Admission: -3,965 mL [10/24/23 1128]  Physical Exam: Physical Exam General: Ill-appearing, sitting in bed, no acute distress Head: Normocephalic, atraumatic, HHFNC in place Cardiac: Regular rate, regular rhythm, no pitting edema bilateral lower extremity Pulmonary: Increase WOB, Diminished breath sounds bilateral lower lung fields, +rales Neuro: Awake, alert, no focal deficits  Patient Lines/Drains/Airways Status     Active Line/Drains/Airways     Name Placement date Placement time Site Days   Peripheral IV 10/19/23 20 G 2.5" Right;Upper Arm 10/19/23  2226  Arm  5   External Urinary Catheter 10/16/23  1858  --  8   Wound / Incision (Open or Dehisced) 10/20/23 (IAD) Incontinence Associated Dermatitis Buttocks Right;Left 10/20/23  1044  Buttocks  4             ASSESSMENT/PLAN:  Assessment: Active Problems:   Type 2 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    Plan: # Acute on chronic hypoxic/hypercarbic respiratory failure (home O2 8L [place]) #Acute on Chronic HF Exacerbation (EF 50-55%) #Acute COPD exacerbation   Presented with worsening dyspnea and fatigue for 1 week with increasing oxygen requirement.  Initial chest x-ray showed mild pulmonary vascular congestion with small bilateral layering pleural effusion. BNP >2000; initial VBG showed pH 7.3, bicarb 39.2, pCO2 73.2. Lung exam notable for bilateral rales and bibasilar crackles. She also had 1+ pitting edema below mid-shin bilaterally.   Per evaluation today, she reported that her breathing has gotten worse, she felt like she is not getting enough air in her lungs even with HHFNC 40L saturating ~95%; placed on BiPAP. CXR 11/30 showed increased bilateral interstitial and patchy opacities, increased moderate bilateral pleural effusions. We will continue to diurese her today and add metolazone 2.5 mg to optimize diuresis with strict I & Os. Will consult PCCM as we have exhausted medications and she seems to be not progressing well with evidence of increased oxygen requirement and worsening of HF in the setting of aggressive diuresis.    Plan: - HHFNC with goal O2 > 88% - IV Lasix 120 mg 3 times daily today - One-time dose of IV Diamox 250 mg today for contraction alkalosis  - One-time dose of Metolazone 2.5 mg, give one hour before lasix  - Continue Yupelri, Budesonide and Atrovent, breathing treatments  - BiPAP at bedtime and as needed, NPO when on BiPAP - Daily weight checks with Strict Is & Os - PT/OT recommended Home health PT/OT   #AKI #Contraction Alkalosis  #Cardiorenal syndrome  Presented with Cr 1.13, elevated from 0.75 on 09/03/2023 likely pre-renal, in the setting of HF exacerbation. Cr continues to trend higher in the setting of high dose lasix; She also had concerns of straining while urinating in the ED. UA negative infection. Bladder scan on 11/24 showed 0 PVR. There  is likely a component of cardiorenal syndrome that is playing in role in her AKI in the setting of worsening HF exacerbation. Will continue IV diamox 250 mg for contraction alkalosis.  - Continue to trent BMP    #Goals of care conversation Initially we talked to the patient about palliative in the setting of her multiple hospitalization this year, end stage diseases that are affecting the quality of her life. She was against the idea and mentioned that she wants to continue fighting. Today, she brought up the topic of her end stage diseases, HF and COPD, and wanted to have a family meeting to discuss goals of care. I have called her daughter [place], she did not pick up. Plan is to set up a family meeting with the patient and talk about palliative care and figure out what the patient values: quality vs quantity of life.    #Chest pain, noncardiac  #Elevated Troponin #Demand Ischemia  Reported chest pain for the past 4 days, muscular like that comes and goes, worsened with movement. Pain is reproducible upon palpation. Troponin elevated  to 19, 24, low suspicion for ACS at this time. Likely demand ischemia in the setting of volume overload. Tylenol as needed for pain     #Hypomagnesemia  Resolved.    Chronic Conditions:  Type 2 Diabetes with nonproliferative diabetic retinopathy: Semglee 30 units with moderate SSI HLD: Continue lipitor 80 mg daily and zetia 10 mg daily Paroxysmal A fib: Currently NSR, resumed Eliquis 5 mg BID HTN: Resumed home medication amlodipine 10 mg daily.    Best Practice: Diet: Cardiac diet IVF: Fluids: None, Rate: None VTE: Eliquis BID Code: Full AB: None  Therapy Recs: Pending, DME: other None  DISPO: Anticipated discharge  2-6 days  to  pending  pending  medical management .  Signature: Jeral Pinch, D.O.  Internal Medicine Resident, PGY-1 [facility]  Pager: [phone] 11:28 AM, 10/24/2023   Please contact the on call  pager after 5 pm and on weekends at [phone].

## 2023-10-24 NOTE — Plan of Care (Signed)

## 2023-10-24 NOTE — Progress Notes (Signed)
Notified by CCMD that O2 sat is in mid 70's. Assessed by CN and primary RN. Patient initially sleeping. With stimulation, able to get O2 sat to 91-92%. Contacted Rob, RT, who instructed to turn Bipap to 100% Oxygen. This was done and O2 sat increased to 94%. RT to come up to assess patient again. Dr. Audie Clear notified.

## 2023-10-24 NOTE — Progress Notes (Signed)
Patient requested to be taken off bipap for a break. Patient placed on HHFNC 30L95% tolerating well.

## 2023-10-24 NOTE — Progress Notes (Signed)
   10/24/23 0825  Adult Ventilator Settings  Vent Type Servo i  Vent Mode PCV  Set Rate 15 bmp  FiO2 (%) 60 %  IPAP 15 cmH20  EPAP 7 cmH20   Pt placed on BiPAP on above settings due to increase WOB. MD aware.

## 2023-10-24 DEATH — deceased

## 2023-10-25 ENCOUNTER — Inpatient Hospital Stay (HOSPITAL_COMMUNITY): Payer: 59

## 2023-10-25 ENCOUNTER — Other Ambulatory Visit (HOSPITAL_BASED_OUTPATIENT_CLINIC_OR_DEPARTMENT_OTHER): Payer: Self-pay

## 2023-10-25 DIAGNOSIS — N179 Acute kidney failure, unspecified: Secondary | ICD-10-CM | POA: Diagnosis not present

## 2023-10-25 DIAGNOSIS — J9622 Acute and chronic respiratory failure with hypercapnia: Secondary | ICD-10-CM | POA: Diagnosis not present

## 2023-10-25 DIAGNOSIS — I509 Heart failure, unspecified: Secondary | ICD-10-CM | POA: Diagnosis not present

## 2023-10-25 DIAGNOSIS — J9601 Acute respiratory failure with hypoxia: Secondary | ICD-10-CM | POA: Diagnosis not present

## 2023-10-25 DIAGNOSIS — J9621 Acute and chronic respiratory failure with hypoxia: Secondary | ICD-10-CM | POA: Diagnosis not present

## 2023-10-25 DIAGNOSIS — J449 Chronic obstructive pulmonary disease, unspecified: Secondary | ICD-10-CM | POA: Diagnosis not present

## 2023-10-25 LAB — BASIC METABOLIC PANEL
Anion gap: 10 (ref 5–15)
BUN: 48 mg/dL — ABNORMAL HIGH (ref 8–23)
CO2: 36 mmol/L — ABNORMAL HIGH (ref 22–32)
Calcium: 9.1 mg/dL (ref 8.9–10.3)
Chloride: 90 mmol/L — ABNORMAL LOW (ref 98–111)
Creatinine, Ser: 1.3 mg/dL — ABNORMAL HIGH (ref 0.44–1.00)
GFR, Estimated: 46 mL/min — ABNORMAL LOW (ref >60)
Glucose, Bld: 210 mg/dL — ABNORMAL HIGH (ref 70–99)
Potassium: 4.1 mmol/L (ref 3.5–5.1)
Sodium: 136 mmol/L (ref 135–145)

## 2023-10-25 LAB — GLUCOSE, CAPILLARY
Glucose-Capillary: 167 mg/dL — ABNORMAL HIGH (ref 70–99)
Glucose-Capillary: 200 mg/dL — ABNORMAL HIGH (ref 70–99)
Glucose-Capillary: 219 mg/dL — ABNORMAL HIGH (ref 70–99)
Glucose-Capillary: 175 mg/dL — ABNORMAL HIGH (ref 70–99)

## 2023-10-25 LAB — MAGNESIUM: Magnesium: 2 mg/dL (ref 1.7–2.4)

## 2023-10-25 LAB — ECHOCARDIOGRAM LIMITED BUBBLE STUDY: S' Lateral: 3.9 cm

## 2023-10-25 MED ORDER — SODIUM CHLORIDE 0.9% FLUSH
10.0000 mL | Freq: Two times a day (BID) | INTRAVENOUS | Status: DC
  Administered 2023-10-25 – 2023-10-30 (×11): 10 mL via INTRAVENOUS

## 2023-10-25 MED ORDER — FUROSEMIDE 10 MG/ML IJ SOLN
120.0000 mg | Freq: Three times a day (TID) | INTRAVENOUS | Status: AC
  Administered 2023-10-25 (×2): 120 mg via INTRAVENOUS
  Filled 2023-10-25: qty 2
  Filled 2023-10-25: qty 12
  Filled 2023-10-25: qty 2

## 2023-10-25 MED ORDER — ACETAZOLAMIDE SODIUM 500 MG IJ SOLR
250.0000 mg | Freq: Once | INTRAMUSCULAR | Status: AC
  Administered 2023-10-25: 250 mg via INTRAVENOUS
  Filled 2023-10-25: qty 250

## 2023-10-25 MED ORDER — METOLAZONE 5 MG PO TABS
5.0000 mg | ORAL_TABLET | Freq: Once | ORAL | Status: AC
  Administered 2023-10-25: 5 mg via ORAL
  Filled 2023-10-25: qty 1

## 2023-10-25 MED ORDER — PERFLUTREN LIPID MICROSPHERE
1.0000 mL | INTRAVENOUS | Status: AC | PRN
  Administered 2023-10-25: 2 mL via INTRAVENOUS

## 2023-10-25 NOTE — Progress Notes (Signed)
NAME:  Alicia [name], MRN:  [id number], DOB:  [birth year], LOS: 9 ADMISSION DATE:  10/16/2023, CONSULTATION DATE:  10/24/23 REFERRING MD:  Merrilee Jansky - [facility], CHIEF COMPLAINT:  hypoxia    History of Present Illness:  67 yo f PMH COPD, Chronic hypoxia (reportedly on 8L at home, last time she saw pulm was 4L), HF recovered EF, CVA, HLD, DM2, HTN, Obesity who was admitted to [facility] 11/23 for SOB and AoC hypoxia, and has been progressively diuresed 11/23-12/1 for a HF exacerbation with initial cxr c/w volume overload and BNP >2000. She has required an escalation of O2 from her baseline during this admission and at times BiPAP vs HHFNC.  On 11/30 she had a near-arrest, becoming bradycardic + associated worsening hypoxia & required atropine.   Despite escalating diuresis, pt remains hypoxic. On 12/1 PCCM is consulted in this setting.  Pertinent  Medical History  COPD Chronic hypoxia HFpEF  Significant Hospital Events: Including procedures, antibiotic start and stop dates in addition to other pertinent events   11/23  admit to [facility] on BiPAP,40 mg lasix in ED then  80 lasix after admission.  11/24 80 lasix BID -- 900cc UOP 11/23-24 11/25 given 1x solumedrol and started on pred for possible AECOPD. Lasix was stopped for AKI (1.44 from 1.3 from 1.1) 11/26 lasix restarted 80 BID 11/27 lasix increased to 120 TID. Pred stopped.  Cr 1.45 11/28 diamox added, lasix de-escalated to 120 BID. Improved renal fxn cr 1.08   11/29 pt apparently brought up end stage dz process questions. Primary plans to organize family meeting  11/30 a code blue was calle d, she did not lose pulses but did require atropine for symptomatic bradycardia. Family meeting later that day, remains full code full scope of offered care.  12/1 PCCM consulted given lack of clinical improvement & ongoing desat events.   Interim History / Subjective:   Remains critically ill, on 80% / 40 L high flow oxygen Afebrile. Complains of shortness of  breath 1.2 L urine output with Lasix  Objective   Blood pressure (!) 135/56, pulse (!) 55, temperature 99 F (37.2 C), temperature source Oral, resp. rate 18, height 5\' 7"  (1.702 m), weight 97.5 kg, SpO2 97%.    FiO2 (%):  [80 %-100 %] 80 %   Intake/Output Summary (Last 24 hours) at 10/25/2023 1210 Last data filed at 10/25/2023 0827 Gross per 24 hour  Intake 958.83 ml  Output 1550 ml  Net -591.17 ml   Filed Weights   10/23/23 0611 10/24/23 0555 10/25/23 0500  Weight: 101.3 kg 99.7 kg 97.5 kg    Examination: General: elderly woman, obese, mild respiratory distress HENT: [place]/AT, moist mucous membranes, HFNC in place Lungs: Mild accessory muscle use, crackles right base Cardiovascular: rrr, no murmurs Abdomen: soft, non-tender, BS+ Extremities: warm, 1+ edema Neuro: alert, oriented, moving all extremities GU: n/a  Labs show normal electrolytes, BUN/creatinine maintaining 48/1.3, negative procalcitonin,  Resolved Hospital Problem list     Assessment & Plan:   Acute on chronic hypoxic and hypercarbic respiratory failure-baseline 8 L oxygen AoC Heart failure, recovered EF COPD Bilateral pleural effusions vs atelectasis AKI  Discussion She has severe hypoxia in spite of 5 L negative balance, no evidence of pneumonia.  BNP remains slight high PFTs 06/2018 has shown moderate to severe restriction with FEV1 45% and FVC 43%.  She quit smoking in 2020.  Have to rule out right-to-left shunt Previous echo showed severe pulm hypertension  Plan - Continue Bipap PRN and  mandatory during sleep.  She is requiring longer stretches of BiPAP and in fact requested me to place her back on BiPAP. -cont diuresis, lasix + metolazone -Await bubble study results - RUQ [name] to evaluate for cirrhosis -would rec Adv HF consult, may need RHC to evaluate for pulmonary hypertension - budesonide, brovana and yupelri nebs for COPD - incentive spirometry recommended for possible atelectasis - out of bed  in chair as much as possible  I had a frank conversation with her and explained to her that mechanical ventilation would not improve her outcome given severe hypoxia at baseline however she still desires full scope of care.  Would continue palliative conversations  Best Practice (right click and "Reselect all SmartList Selections" daily)   Per primary team  Labs   CBC: Recent Labs  Lab 10/19/23 0250 10/20/23 0259 10/24/23 0244  WBC 8.3 12.2* 7.9  HGB 10.4* 10.1* 9.9*  HCT 34.7* 33.9* 33.9*  MCV 87.2 86.0 87.4  PLT 263 289 218    Basic Metabolic Panel: Recent Labs  Lab 10/20/23 0259 10/21/23 0221 10/22/23 0229 10/23/23 0213 10/24/23 0244 10/25/23 1008  NA 134* 138 139 137 139 136  K 5.1 4.0 4.4 3.9 4.1 4.1  CL 93* 93* 95* 95* 95* 90*  CO2 32 38* 37* 35* 35* 36*  GLUCOSE 214* 59* 156* 181* 85 210*  BUN 60* 56* 53* 53* 52* 48*  CREATININE 1.45* 1.08* 1.29* 1.17* 1.25* 1.30*  CALCIUM 9.2 9.1 8.7* 8.6* 9.1 9.1  MG 2.3 2.0 2.0 2.0  --  2.0   GFR: Estimated Creatinine Clearance: 51.8 mL/min (A) (by C-G formula based on SCr of 1.3 mg/dL (H)). Recent Labs  Lab 10/19/23 0250 10/20/23 0259 10/24/23 0244 10/24/23 1556  PROCALCITON  --   --   --  <0.10  WBC 8.3 12.2* 7.9  --     Liver Function Tests: Recent Labs  Lab 10/24/23 1959  AST 14*  ALT 13  ALKPHOS 63  BILITOT 0.5  PROT 5.8*  ALBUMIN 2.7*   No results for input(s): "LIPASE", "AMYLASE" in the last 168 hours. No results for input(s): "AMMONIA" in the last 168 hours.  ABG    Component Value Date/Time   PHART 7.39 10/23/2023 1939   PCO2ART 68 (HH) 10/23/2023 1939   PO2ART 68 (L) 10/23/2023 1939   HCO3 41.8 (H) 10/23/2023 1939   TCO2 41 (H) 10/16/2023 1428   O2SAT 95.3 10/23/2023 1939     Coagulation Profile: No results for input(s): "INR", "PROTIME" in the last 168 hours.  Cardiac Enzymes: No results for input(s): "CKTOTAL", "CKMB", "CKMBINDEX", "TROPONINI" in the last 168  hours.  HbA1C: Hemoglobin A1C  Date/Time Value Ref Range Status  09/03/2023 09:19 AM 8.1 (A) 4.0 - 5.6 % Final  05/03/2023 08:50 AM 8.2 (A) 4.0 - 5.6 % Final   Hgb A1c MFr Bld  Date/Time Value Ref Range Status  02/09/2022 11:00 AM 8.1 (H) 4.8 - 5.6 % Final    Comment:             Prediabetes: 5.7 - 6.4          Diabetes: >6.4          Glycemic control for adults with diabetes: <7.0   08/03/2017 07:19 AM 11.0 (H) 4.8 - 5.6 % Final    Comment:    (NOTE) Pre diabetes:          5.7%-6.4% Diabetes:              >  6.4% Glycemic control for   <7.0% adults with diabetes     CBG: Recent Labs  Lab 10/24/23 1101 10/24/23 1618 10/24/23 2045 10/25/23 0622 10/25/23 1038  GLUCAP 166* 275* 222* 167* 200*    Cyril Mourning MD. FCCP. [facility] Pager : [phone]  If no response to pager , please call [phone] until 7 pm After 7:00 pm call Elink  [phone]   10/25/2023

## 2023-10-25 NOTE — Progress Notes (Signed)
  Interdisciplinary Goals of Care Family Meeting   Date carried out:: 10/25/2023  Location of the meeting: Bedside  Member's involved: Physician, Respiratory Therapist, and Family Member or next of kin  Durable Power of Attorney or acting medical decision maker: [name]    Discussion: We discussed goals of care for Alicia [name] .  Explained that given her severe baseline hypoxia, she would likely not be able to liberate from mechanical ventilation & would need prolonged ventilation if we went down that path.She would not want this & agreed to DNR/I status  Code status: DNR if arrests, full medical care otherwise  Disposition: Continue current acute care   Time spent for the meeting: 20 mins  Tremane Spurgeon V. Maymunah Stegemann 10/25/2023, 3:41 PM
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
## 2023-10-25 NOTE — Progress Notes (Signed)
Heart Failure Stewardship Pharmacist Progress Note   PCP: Alicia Alias, MD PCP-Cardiologist: Alicia Schultz, MD    HPI:  67 yo F with PMH of COPD, oxygen dependent, CHF (EF 20-25% in 2015, recovered to 55-60%), CAD, HLD, GERD, T2DM, and CVA.    Admitted in Nov 2023 with acute on chronic CHF exacerbation. Diuresed w/ IV Lasix and treated w/ abx, steroids and nebs. Echo showed normal EF 55-60% and normal RV. After diureses, was transitioned to PO Lasix. Also placed on Entresto.   She was seen in HF TOC 10/2022. Jardiance increased to 10 mg daily and spironlactone added. She was seen back in [facility] 11/2022 and spironolactone was increased to 25 mg daily. She was later seen at [facility] on 1/30 and lasix was decreased to 80 mg AM and 40 mg PM.   Admitted 01/2023 with acute on chronic CHF exacerbation. ECHO 3/17 showed LVEF 60-65%. Diuresed with IV lasix 120 mg, metolazone, and diamox. Discharged on Entresto, spironolactone, amlodipine, and lasix. Alicia [name] held due to UTI.    Admitted 05/2023 with a/c CHF. CXR showed pulmonary congestion. BNP elevated. ECHO 7/24 showed LVEF 50-60%, G3DD, no RWMA, RV normal, moderately elevated PA pressure. Spironolactone with held at discharge.   She was seen again in HF TOC 06/2023. Volume status trending up. She was instructed to increase torsemide to 40 mg AM and 20 mg PM. Spironolactone was restarted.    Admitted 06/2023 with COPD exacerbation due to COVID-19 infection. She was discharged on 9/4.    Admitted 07/2023 when family found her unresponsive and hypoxic at home and was being treated for acute CHF. ECHO 9/15 showed LVEF 50-55%, global hypokinesis, G3DD, RV normal, moderate to severe TR and worsening pulmonary HTN. Discharged on increased Entresto, spironolactone, and torsemide 60 mg daily.  Presented to the ED on 11/23 with shortness of breath and chest pain for 1 week. CXR with CHF and mild pulmonary edema. BNP elevated. ECHO 11/24 with EF 50-55%. Reports  compliance with her medications at home. Reported taking both furosemide (last filled 9/4 x 90 days) and torsemide (last filled 11/8 x 90 days). Also reported urinary hesitancy on admission. Currently denying any dysuria today.   She is accompanied by her daughter today. Reports difficulty with shortness of breath. States she struggles more around lunchtime and then around 6-7pm. I/Os have not been accurate because her catheter is leaking. Repeat CXR ordered.   Current HF Medications: Diuretic: furosemide 120 mg IV TID  Prior to admission HF Medications: Diuretic: furosemide 80 mg daily AND torsemide 40 mg BID ACE/ARB/ARNI: Entresto 97/103 mg BID MRA: spironolactone 25 mg daily - stated not taking and switched to torsemide?  Pertinent Lab Values: Serum creatinine 1.30, BUN 48, Potassium 4.1, Sodium 136, BNP 2554.5, Magnesium 2.0, A1c 8.1   Vital Signs: Weight: 214 lbs (admission weight: 236 lbs) Blood pressure: 130-150/60s  Heart rate: 50s  I/O: net +0.4L yesterday; net -4.6L since admission  Medication Assistance / Insurance Benefits Check: Does the patient have prescription insurance?  Yes Type of insurance plan: [place] Medicaid  Outpatient Pharmacy:  Prior to admission [facility] Is the patient willing to use [facility] TOC pharmacy at discharge? Yes Is the patient willing to transition their outpatient pharmacy to utilize a [facility] outpatient pharmacy?   No    Assessment: 1. 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 NYHA class III symptoms. - Remains volume overloaded on exam. Continue furosemide 120 mg IV TID. S/p metolazone 5  mg x 1 yesterday. Stop furosemide tablets at discharge. Strict I/Os and daily weights. Keep K>4 and Mg>2. - No BB with bradycardia - Holding Entresto with AKI - baseline creatinine ~1 - Reported stopping spironolactone due to side effects - increased tremors - tremors resolved with discontinuation - Held [place] last  admission with UTI. Given dysuria this admission with AKI - continue holding.    Plan: 1) Medication changes recommended at this time: - Consider adding metolazone 5 mg x 1 again today - Remove duplicate diuretics on discharge - Remove spironolactone from medication list at discharge - Stop hydrochlorothiazide on discharge  2) Patient assistance: - None pending, has [place] Medicaid  3)  Education  - Initial education complete - Full education to be completed prior to discharge  Alicia [name], PharmD, BCPS Heart Failure Stewardship Pharmacist Phone [phone]

## 2023-10-25 NOTE — Progress Notes (Signed)
HD#9 SUBJECTIVE:  Patient Summary: Alicia [name] is a 67 y.o. 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     Overnight Events: None    Interim History: Patient evaluated bedside this morning, getting her echo. Patient reports that her breathing has somewhat improved compared to yesterday, though she still feels short of breath.  She reports that she has been urinating a lot, however her pure wick catheter comes off and leaks.  She reports that she was able to eat yesterday and had eaten this morning.  Otherwise she denies any recent fever, chills, nausea, vomiting, abdominal pain.  OBJECTIVE:  Vital Signs: Vitals:   10/25/23 0738 10/25/23 0826 10/25/23 1034 10/25/23 1217  BP:  (!) 135/56    Pulse:  (!) 52 (!) 55   Resp: (!) 22 20 18    Temp:  98.5 F (36.9 C) 99 F (37.2 C)   TempSrc:  Oral Oral   SpO2:  97%  99%  Weight:      Height:       Supplemental O2:  HHFNC SpO2: 99 % O2 Flow Rate (L/min): 40 L/min FiO2 (%): 80 %  Filed Weights   10/23/23 0611 10/24/23 0555 10/25/23 0500  Weight: 101.3 kg 99.7 kg 97.5 kg     Intake/Output Summary (Last 24 hours) at 10/25/2023 1433 Last data filed at 10/25/2023 1316 Gross per 24 hour  Intake 901.83 ml  Output 1550 ml  Net -648.17 ml   Net IO Since Admission: -4,589.7 mL [10/25/23 1433]  Physical Exam: Physical Exam General: Ill-appearing, sitting in bed, no acute distress Head: Normocephalic, atraumatic, wearing HHFNC 40 L Cardiac: Regular rate, regular rhythm, no pitting edema bilateral lower  extremities Pulmonary: +upper airway sounds + crackles  Abdomen: Soft, not tender, nondistended, bowel sounds present Neuro: Awake, alert, no focal deficit  Patient Lines/Drains/Airways Status     Active Line/Drains/Airways     Name Placement date Placement time Site Days   Peripheral IV 10/25/23 20 G 1.88" Anterior;Left Forearm 10/25/23  1014  Forearm  less than 1   External Urinary Catheter 10/25/23  2130  --  less than 1   Wound / Incision (Open or Dehisced) 10/20/23 (IAD) Incontinence Associated Dermatitis Buttocks Right;Left 10/20/23  1044  Buttocks  5             ASSESSMENT/PLAN:  Assessment: Active Problems:   Type 2 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   Plan: # Acute on chronic hypoxic/hypercarbic respiratory failure (home O2 8L [place]) #Acute on Chronic HF Exacerbation (EF 50-55%) #Acute COPD exacerbation   Presented with worsening dyspnea and fatigue for 1 week with increasing oxygen requirement.  Initial chest x-ray showed mild  pulmonary vascular congestion with small bilateral layering pleural effusion. BNP >2000; initial VBG showed pH 7.3, bicarb 39.2, pCO2 73.2. Lung exam notable for bilateral rales and bibasilar crackles. She also had 1+ pitting edema below mid-shin bilaterally.   Per my evaluation today, afebrile, on HHFNC, she reports that her breathing has improved compared to yesterday, though she still feels somewhat short of breath.  She reports that she was able to eat yesterday, had a good breakfast this morning as well. Reports she has been urinating a lot, however the pure wick catheter leaks. PCCM following, appreciate their recommendations. Echo EF 45 to 50%, decreased from echo findings on 11/24. Advance heart failure team is consulted.   Plan: - HHFNC with goal O2 > 88% - IV Lasix 120 mg 3 times daily today - One-time dose of IV Diamox 250 mg today for contraction alkalosis  - One-time dose of Metolazone 5 mg, give one hour before lasix  - Advanced heart failure team consulted today - Continue Yupelri, Budesonide and Atrovent, breathing treatments  - BiPAP at bedtime and as needed, NPO when on BiPAP - Daily weight checks with Strict Is & Os - PT/OT recommended Home health PT/OT    #AKI #Contraction Alkalosis  #Cardiorenal syndrome  Presented with Cr 1.13, elevated from 0.75 on 09/03/2023 likely pre-renal, in the setting of HF exacerbation. Cr continues to trend higher in the setting of high dose lasix; She also had concerns of straining while urinating in the ED. UA negative infection. Bladder scan on 11/24 showed 0 PVR. There is likely a component of cardiorenal syndrome that is playing in role in her AKI in the setting of worsening HF exacerbation. Will continue IV diamox 250 mg for contraction alkalosis.  - Continue to trent BMP    Chronic Conditions:  Type 2 Diabetes with nonproliferative diabetic retinopathy: Semglee 30 units with moderate SSI HLD: Continue lipitor 80 mg daily and zetia 10 mg  daily Paroxysmal A fib: Currently NSR, resumed Eliquis 5 mg BID HTN: Resumed home medication amlodipine 10 mg daily.  Best Practice: Diet: Cardiac diet IVF: Fluids: None, Rate: None VTE: Eliquis twice daily Code: Full AB: None Therapy Recs: None, DME: none DISPO: Anticipated discharge  2-6 days  to Home pending  medical management .  Signature: Jeral Pinch, D.O.  Internal Medicine Resident, PGY-1 [facility]  Pager: [phone] 2:33 PM, 10/25/2023   Please contact the on call pager after 5 pm and on weekends at [phone].

## 2023-10-25 NOTE — Progress Notes (Signed)
Heart Failure Navigator Progress Note  Assessed for Heart & Vascular TOC clinic readiness.  Patient does not meet criteria due to Advanced Heart Failure Team consulted, original scheduled HF TOC appointment has been cancelled and AVS updated. .   Navigator will sign off at this time.   Rhae Hammock, BSN, Scientist, clinical (histocompatibility and immunogenetics) Only

## 2023-10-25 NOTE — Progress Notes (Addendum)
Physical Therapy Treatment Patient Details Name: Alicia [name] MRN: [id number] DOB: [birth year] Today's Date: 10/25/2023   History of Present Illness Pt is 67 yo presenting to [facility] ED on 11/23 due to worsening shortness of breath and fatigue and admitted for acute on chronic hypoxic respiratory failure secondary to CHF exacerbation. PMH: COPD, CHF, DM-2, diabetic retinopathy, SA node dysfunction, Hx CVA, CAD, and recent admissions for PNA and COVID-19 in 06/2023 and 07/2023.    PT Comments  Patient progressing slowly.  Able to transfer OOB to chair and perform sit<>stand and armchair pushups.  She was SOB though still able to speak and noted SpO2 drop into low 80's with OOB to chair on 80% FiO2 HHNC 30L.  Daughter present and re-iterating plans to take pt home.  She will set up first floor bedroom for pt and have family and friends in to help when she has to work.  Pt. Will continue to benefit from skilled PT in the acute setting.  Recommend HHPT at d/c.    If plan is discharge home, recommend the following: A lot of help with walking and/or transfers;A little help with bathing/dressing/bathroom;Help with stairs or ramp for entrance   Can travel by private vehicle        Equipment Recommendations  Wheelchair (measurements PT) (transport wheelchair)    Recommendations for Other Services       Precautions / Restrictions Precautions Precautions: Fall Precaution Comments: watch O2, on 30L HHFNC 80% FiO2 for session Restrictions Weight Bearing Restrictions: No     Mobility  Bed Mobility Overal bed mobility: Needs Assistance Bed Mobility: Supine to Sit     Supine to sit: Min assist, HOB elevated     General bed mobility comments: A for scooting hips, HOB up    Transfers Overall transfer level: Needs assistance Equipment used: Rolling walker (2 wheels) Transfers: Sit to/from Stand Sit to Stand: Min assist   Step pivot transfers: Min assist, +2 safety/equipment       General  transfer comment: assist from EOB to step to recliner then stood from recliner    Ambulation/Gait                   Stairs             Wheelchair Mobility     Tilt Bed    Modified Rankin (Stroke Patients Only)       Balance Overall balance assessment: Needs assistance Sitting-balance support: No upper extremity supported Sitting balance-Leahy Scale: Good     Standing balance support: Bilateral upper extremity supported Standing balance-Leahy Scale: Poor Standing balance comment: UE support for balance                            Cognition Arousal: Alert Behavior During Therapy: WFL for tasks assessed/performed Overall Cognitive Status: Within Functional Limits for tasks assessed                                 General Comments: AAOx4 and pleasant throughout session. Able to follow multi-step instructions consistently.        Exercises General Exercises - Upper Extremity Chair Push Up: AROM, Both, 5 reps, Seated General Exercises - Lower Extremity Long Arc Quad: 10 reps, Both, Seated, AROM Hip Flexion/Marching: AROM, Both, Seated, 10 reps (with resistance 2x 5reps)    General Comments General comments (skin integrity, edema, etc.): SpO2  fluctuating with questionable probe placement, dropped at one point after probe readjustment to 79%, recovered to 90% or greater during seated exercises.  Daughter and grandson present.  Discussed home as has two level home.  Daughter committed to take pt  home planning to set up first floor bed and to have family and friends assist so she has close to 24/7 assistance      Pertinent Vitals/Pain Pain Assessment Pain Assessment: Faces Faces Pain Scale: No hurt    Home Living                          Prior Function            PT Goals (current goals can now be found in the care plan section) Progress towards PT goals: Progressing toward goals    Frequency    Min  1X/week      PT Plan      Co-evaluation PT/OT/SLP Co-Evaluation/Treatment: Yes Reason for Co-Treatment: To address functional/ADL transfers;For patient/therapist safety PT goals addressed during session: Mobility/safety with mobility;Balance;Strengthening/ROM OT goals addressed during session: Strengthening/ROM;ADL's and self-care      AM-PAC PT "6 Clicks" Mobility   Outcome Measure  Help needed turning from your back to your side while in a flat bed without using bedrails?: A Little Help needed moving from lying on your back to sitting on the side of a flat bed without using bedrails?: A Little Help needed moving to and from a bed to a chair (including a wheelchair)?: A Little Help needed standing up from a chair using your arms (e.g., wheelchair or bedside chair)?: A Little Help needed to walk in hospital room?: Total Help needed climbing 3-5 steps with a railing? : Total 6 Click Score: 14    End of Session Equipment Utilized During Treatment: Gait belt;Oxygen Activity Tolerance: Patient limited by fatigue Patient left: with call bell/phone within reach;with bed alarm set;in chair;with family/visitor present Nurse Communication: Mobility status PT Visit Diagnosis: Other abnormalities of gait and mobility (R26.89);Muscle weakness (generalized) (M62.81)     Time: 6387-5643 PT Time Calculation (min) (ACUTE ONLY): 39 min  Charges:    $Therapeutic Activity: 8-22 mins $Self Care/Home Management: 8-22 PT General Charges $$ ACUTE PT VISIT: 1 Visit                     Sheran Lawless, PT Acute Rehabilitation Services [phone] 10/25/2023    Elray Mcgregor 10/25/2023, 6:08 PM

## 2023-10-25 NOTE — Care Management Important Message (Signed)
Important Message  Patient Details  Name: Alicia [name] MRN: [id number] Date of Birth: 1956-01-01   Important Message Given:  Yes - Medicare IM     Dorena Bodo 10/25/2023, 3:38 PM

## 2023-10-25 NOTE — Progress Notes (Signed)
Occupational Therapy Treatment Patient Details Name: Alicia [name] MRN: [id number] DOB: Nov 24, 1955 Today's Date: 10/25/2023   History of present illness Pt is 67 yo presenting to [facility] ED on 11/23 due to worsening shortness of breath and fatigue and admitted for acute on chronic hypoxic respiratory failure secondary to CHF exacerbation. PMH: COPD, CHF, DM-2, diabetic retinopathy, SA node dysfunction, Hx CVA, CAD, and recent admissions for PNA and COVID-19 in 06/2023 and 07/2023.   OT comments  Pt progressing well with OT sessions, she needed moderate cues for pursed lip breathing but reports she was performing it but it was just hard to breath through the nose while being on HHFNC. Pt assisting with LBB and LBD but still needs some assist, she tolerated BUE/BLE exercises well with rest breaks in between sets prn. OT to continue to progress pt as able, DC plans remain appropriate for [facility] with continued progression.       If plan is discharge home, recommend the following:  Assistance with cooking/housework;Assist for transportation;Help with stairs or ramp for entrance;A lot of help with walking and/or transfers;A lot of help with bathing/dressing/bathroom   Equipment Recommendations  BSC/3in1 (bariactric (wider) commode)    Recommendations for Other Services      Precautions / Restrictions Precautions Precautions: Fall Precaution Comments: watch O2, on 15L HHFNC for session Restrictions Weight Bearing Restrictions: No       Mobility Bed Mobility               General bed mobility comments: pt already EOB with PT upon return to room after procuring linens    Transfers Overall transfer level: Needs assistance Equipment used: Rolling walker (2 wheels) Transfers: Sit to/from Stand Sit to Stand: Contact guard assist     Step pivot transfers: Contact guard assist     General transfer comment: STS from EOB CGA, STS from recliner CGA.     Balance Overall balance assessment:  Needs assistance Sitting-balance support: No upper extremity supported, Feet supported Sitting balance-Leahy Scale: Good     Standing balance support: Bilateral upper extremity supported, During functional activity Standing balance-Leahy Scale: Fair                             ADL either performed or assessed with clinical judgement   ADL               Lower Body Bathing: Sitting/lateral leans;Minimal assistance       Lower Body Dressing: Sit to/from stand;Moderate assistance (don briefs)                 General ADL Comments: reinforced use of pursed lip breathing    Extremity/Trunk Assessment              Vision       Perception     Praxis      Cognition Arousal: Alert Behavior During Therapy: WFL for tasks assessed/performed Overall Cognitive Status: Within Functional Limits for tasks assessed                                          Exercises General Exercises - Upper Extremity Chair Push Up: AROM, Both, 5 reps, Seated General Exercises - Lower Extremity Long Arc Quad: 10 reps, Both, Seated, AROM Hip Flexion/Marching: AROM, Both, Seated, 10 reps (with light resistance, 2 sets of 5)  Shoulder Instructions       General Comments Sp02 desat to 79% with activity, increased time needed for rest breaks, Pt denied feeling SOB although Sp02 remained in the low 80s. Adjusted monitors, pleth looked much better and Sp02 >89% with seated exercises    Pertinent Vitals/ Pain       Pain Assessment Pain Assessment: Faces Faces Pain Scale: No hurt  Home Living                                          Prior Functioning/Environment              Frequency  Min 1X/week        Progress Toward Goals  OT Goals(current goals can now be found in the care plan section)  Progress towards OT goals: Progressing toward goals  Acute Rehab OT Goals Patient Stated Goal: get home to see her Grandkids OT  Goal Formulation: With patient Time For Goal Achievement: 10/25/2023 Potential to Achieve Goals: Fair  Plan      Co-evaluation    PT/OT/SLP Co-Evaluation/Treatment: Yes   PT goals addressed during session: Mobility/safety with mobility;Balance;Strengthening/ROM OT goals addressed during session: Strengthening/ROM;ADL's and self-care      AM-PAC OT "6 Clicks" Daily Activity     Outcome Measure   Help from another person eating meals?: A Little Help from another person taking care of personal grooming?: A Little Help from another person toileting, which includes using toliet, bedpan, or urinal?: A Lot Help from another person bathing (including washing, rinsing, drying)?: A Lot Help from another person to put on and taking off regular upper body clothing?: A Little Help from another person to put on and taking off regular lower body clothing?: A Lot 6 Click Score: 15    End of Session Equipment Utilized During Treatment: Oxygen;Gait belt;Rolling walker (2 wheels) (HHFNC)  OT Visit Diagnosis: Other abnormalities of gait and mobility (R26.89);Unsteadiness on feet (R26.81);Muscle weakness (generalized) (M62.81);Other (comment)   Activity Tolerance Patient tolerated treatment well   Patient Left with call bell/phone within reach;in chair;with family/visitor present   Nurse Communication Mobility status        Time: 1610-9604 OT Time Calculation (min): 39 min  Charges: OT General Charges $OT Visit: 1 Visit OT Treatments $Therapeutic Exercise: 8-22 mins  10/25/2023  AB, OTR/L  Acute Rehabilitation Services  Office: [phone]   Tristan Schroeder 10/25/2023, 3:32 PM

## 2023-10-25 NOTE — Plan of Care (Signed)
  Problem: Education: Goal: Knowledge of General Education information will improve Description: Including pain rating scale, medication(s)/side effects and non-pharmacologic comfort measures Outcome: Progressing   Problem: Clinical Measurements: Goal: Ability to maintain clinical measurements within normal limits will improve Outcome: Progressing   Problem: Coping: Goal: Level of anxiety will decrease Outcome: Progressing   Problem: Elimination: Goal: Will not experience complications related to urinary retention Outcome: Progressing

## 2023-10-26 ENCOUNTER — Encounter (HOSPITAL_COMMUNITY): Admission: EM | Disposition: E | Payer: Self-pay | Source: Home / Self Care | Attending: Internal Medicine

## 2023-10-26 DIAGNOSIS — I5021 Acute systolic (congestive) heart failure: Secondary | ICD-10-CM

## 2023-10-26 DIAGNOSIS — J449 Chronic obstructive pulmonary disease, unspecified: Secondary | ICD-10-CM | POA: Diagnosis not present

## 2023-10-26 DIAGNOSIS — J439 Emphysema, unspecified: Secondary | ICD-10-CM | POA: Diagnosis not present

## 2023-10-26 DIAGNOSIS — J9622 Acute and chronic respiratory failure with hypercapnia: Secondary | ICD-10-CM | POA: Diagnosis not present

## 2023-10-26 DIAGNOSIS — J9601 Acute respiratory failure with hypoxia: Secondary | ICD-10-CM | POA: Diagnosis not present

## 2023-10-26 DIAGNOSIS — J9621 Acute and chronic respiratory failure with hypoxia: Secondary | ICD-10-CM | POA: Diagnosis not present

## 2023-10-26 LAB — GLUCOSE, CAPILLARY
Glucose-Capillary: 120 mg/dL — ABNORMAL HIGH (ref 70–99)
Glucose-Capillary: 141 mg/dL — ABNORMAL HIGH (ref 70–99)
Glucose-Capillary: 98 mg/dL (ref 70–99)
Glucose-Capillary: 141 mg/dL — ABNORMAL HIGH (ref 70–99)
Glucose-Capillary: 158 mg/dL — ABNORMAL HIGH (ref 70–99)

## 2023-10-26 LAB — MAGNESIUM: Magnesium: 1.8 mg/dL (ref 1.7–2.4)

## 2023-10-26 LAB — CBC
HCT: 34.6 % — ABNORMAL LOW (ref 36.0–46.0)
Hemoglobin: 10.5 g/dL — ABNORMAL LOW (ref 12.0–15.0)
MCH: 25.5 pg — ABNORMAL LOW (ref 26.0–34.0)
MCHC: 30.3 g/dL (ref 30.0–36.0)
MCV: 84.2 fL (ref 80.0–100.0)
Platelets: 180 10*3/uL (ref 150–400)
RBC: 4.11 MIL/uL (ref 3.87–5.11)
RDW: 16.8 % — ABNORMAL HIGH (ref 11.5–15.5)
WBC: 8.5 10*3/uL (ref 4.0–10.5)
nRBC: 0 % (ref 0.0–0.2)

## 2023-10-26 LAB — BASIC METABOLIC PANEL
Anion gap: 10 (ref 5–15)
BUN: 49 mg/dL — ABNORMAL HIGH (ref 8–23)
CO2: 37 mmol/L — ABNORMAL HIGH (ref 22–32)
Calcium: 8.9 mg/dL (ref 8.9–10.3)
Chloride: 88 mmol/L — ABNORMAL LOW (ref 98–111)
Creatinine, Ser: 1.34 mg/dL — ABNORMAL HIGH (ref 0.44–1.00)
GFR, Estimated: 44 mL/min — ABNORMAL LOW (ref >60)
Glucose, Bld: 174 mg/dL — ABNORMAL HIGH (ref 70–99)
Potassium: 3.6 mmol/L (ref 3.5–5.1)
Sodium: 135 mmol/L (ref 135–145)

## 2023-10-26 SURGERY — RIGHT HEART CATH
Anesthesia: LOCAL

## 2023-10-26 MED ORDER — POTASSIUM CHLORIDE CRYS ER 20 MEQ PO TBCR
40.0000 meq | EXTENDED_RELEASE_TABLET | Freq: Once | ORAL | Status: AC
  Administered 2023-10-26: 40 meq via ORAL
  Filled 2023-10-26: qty 2

## 2023-10-26 MED ORDER — TORSEMIDE 20 MG PO TABS
60.0000 mg | ORAL_TABLET | Freq: Two times a day (BID) | ORAL | Status: DC
  Administered 2023-10-27 – 2023-10-30 (×8): 60 mg via ORAL
  Filled 2023-10-26 (×8): qty 3

## 2023-10-26 MED ORDER — FUROSEMIDE 10 MG/ML IJ SOLN
120.0000 mg | Freq: Three times a day (TID) | INTRAVENOUS | Status: AC
  Administered 2023-10-26: 120 mg via INTRAVENOUS
  Filled 2023-10-26: qty 12
  Filled 2023-10-26: qty 10

## 2023-10-26 MED ORDER — MAGNESIUM SULFATE 2 GM/50ML IV SOLN
2.0000 g | Freq: Once | INTRAVENOUS | Status: AC
  Administered 2023-10-26: 2 g via INTRAVENOUS
  Filled 2023-10-26: qty 50

## 2023-10-26 MED ORDER — ORAL CARE MOUTH RINSE: 15.0000 mL | OROMUCOSAL | Status: DC | PRN

## 2023-10-26 MED ORDER — METOLAZONE 5 MG PO TABS
5.0000 mg | ORAL_TABLET | Freq: Once | ORAL | Status: AC
  Administered 2023-10-26: 5 mg via ORAL
  Filled 2023-10-26: qty 1

## 2023-10-26 NOTE — Progress Notes (Signed)
NAME:  Alicia [name], MRN:  [id number], DOB:  [birth year], LOS: 10 ADMISSION DATE:  10/16/2023, CONSULTATION DATE:  10/24/23 REFERRING MD:  Merrilee Jansky - IMTS, CHIEF COMPLAINT:  hypoxia    History of Present Illness:  67 yo f PMH COPD, Chronic hypoxia (reportedly on 8L at home, last time she saw pulm was 4L), HF recovered EF, CVA, HLD, DM2, HTN, Obesity who was admitted to IMTS 11/23 for SOB and AoC hypoxia, and has been progressively diuresed 11/23-12/1 for a HF exacerbation with initial cxr c/w volume overload and BNP >2000. She has required an escalation of O2 from her baseline during this admission and at times BiPAP vs HHFNC.  On 11/30 she had a near-arrest, becoming bradycardic + associated worsening hypoxia & required atropine.   Despite escalating diuresis, pt remains hypoxic. On 12/1 PCCM is consulted in this setting.  Pertinent  Medical History  COPD Chronic hypoxia HFpEF  Significant Hospital Events: Including procedures, antibiotic start and stop dates in addition to other pertinent events   11/23  admit to IMTS on BiPAP,40 mg lasix in ED then  80 lasix after admission.  11/24 80 lasix BID -- 900cc UOP 11/23-24 11/25 given 1x solumedrol and started on pred for possible AECOPD. Lasix was stopped for AKI (1.44 from 1.3 from 1.1) 11/26 lasix restarted 80 BID 11/27 lasix increased to 120 TID. Pred stopped.  Cr 1.45 11/28 diamox added, lasix de-escalated to 120 BID. Improved renal fxn cr 1.08   11/29 pt apparently brought up end stage dz process questions. Primary plans to organize family meeting  11/30 a code blue was calle d, she did not lose pulses but did require atropine for symptomatic bradycardia. Family meeting later that day, remains full code full scope of offered care.  12/1 PCCM consulted given lack of clinical improvement & ongoing desat events.   Interim History / Subjective:   Diuresed 2.4 L with Lasix but remains severely hypoxic, in fact increased to 1% and 50 L  this morning, saturations still 85-88 range  Objective   Blood pressure 139/65, pulse (!) 52, temperature 97.7 F (36.5 C), temperature source Oral, resp. rate 20, height 5\' 7"  (1.702 m), weight 99.3 kg, SpO2 90%.    FiO2 (%):  [60 %-100 %] 100 %   Intake/Output Summary (Last 24 hours) at 10/26/2023 1009 Last data filed at 10/26/2023 0408 Gross per 24 hour  Intake 422.26 ml  Output 1950 ml  Net -1527.74 ml   Filed Weights   10/24/23 0555 10/25/23 0500 10/26/23 0522  Weight: 99.7 kg 97.5 kg 99.3 kg    Examination: General: elderly woman, obese, mild respiratory distress HENT: [place]/AT, moist mucous membranes, HFNC in place Lungs: No accessory muscle use, crackles right base Cardiovascular: rrr, no murmurs Abdomen: soft, non-tender, BS+ Extremities: warm,no  edema Neuro: alert, oriented, moving all extremities GU: n/a  Labs show mild hypokalemia 3.6, BUN/creatinine 19 and 49/1.3, no leukocytosis  Resolved Hospital Problem list     Assessment & Plan:   Acute on chronic hypoxic and hypercarbic respiratory failure-baseline 8 L oxygen AoC HFrEF COPD Bilateral pleural effusions vs atelectasis AKI  Discussion She has severe hypoxia in spite of 6 L negative balance, no evidence of pneumonia.  BNP remains slight high PFTs 06/2018 has shown moderate to severe restriction with FEV1 45% and FVC 43%.  She quit smoking in 2020.  Have to rule out right-to-left shunt Previous echo showed severe pulm hypertension, bubble study indeterminate  Plan - Continue Bipap PRN  and mandatory during sleep.   -DNR/DNI issued after IPAL discussion 12/2 -cont diuresis, lasix + metolazone -RHC planned by advanced heart failure service, discussed with Dr. 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 and appears adequately diuresed - budesonide, brovana and yupelri nebs for COPD - incentive spirometry recommended for  possible atelectasis    Would continue palliative conversations, we do not have other therapeutic options  Best Practice (right click and "Reselect all SmartList Selections" daily)   Per primary team  Labs   CBC: Recent Labs  Lab 10/20/23 0259 10/24/23 0244 10/26/23 0801  WBC 12.2* 7.9 8.5  HGB 10.1* 9.9* 10.5*  HCT 33.9* 33.9* 34.6*  MCV 86.0 87.4 84.2  PLT 289 218 180    Basic Metabolic Panel: Recent Labs  Lab 10/21/23 0221 10/22/23 0229 10/23/23 0213 10/24/23 0244 10/25/23 1008 10/26/23 0245  NA 138 139 137 139 136 135  K 4.0 4.4 3.9 4.1 4.1 3.6  CL 93* 95* 95* 95* 90* 88*  CO2 38* 37* 35* 35* 36* 37*  GLUCOSE 59* 156* 181* 85 210* 174*  BUN 56* 53* 53* 52* 48* 49*  CREATININE 1.08* 1.29* 1.17* 1.25* 1.30* 1.34*  CALCIUM 9.1 8.7* 8.6* 9.1 9.1 8.9  MG 2.0 2.0 2.0  --  2.0 1.8   GFR: Estimated Creatinine Clearance: 50.7 mL/min (A) (by C-G formula based on SCr of 1.34 mg/dL (H)). Recent Labs  Lab 10/20/23 0259 10/24/23 0244 10/24/23 1556 10/26/23 0801  PROCALCITON  --   --  <0.10  --   WBC 12.2* 7.9  --  8.5    Liver Function Tests: Recent Labs  Lab 10/24/23 1959  AST 14*  ALT 13  ALKPHOS 63  BILITOT 0.5  PROT 5.8*  ALBUMIN 2.7*   No results for input(s): "LIPASE", "AMYLASE" in the last 168 hours. No results for input(s): "AMMONIA" in the last 168 hours.  ABG    Component Value Date/Time   PHART 7.39 10/23/2023 1939   PCO2ART 68 (HH) 10/23/2023 1939   PO2ART 68 (L) 10/23/2023 1939   HCO3 41.8 (H) 10/23/2023 1939   TCO2 41 (H) 10/16/2023 1428   O2SAT 95.3 10/23/2023 1939     Coagulation Profile: No results for input(s): "INR", "PROTIME" in the last 168 hours.  Cardiac Enzymes: No results for input(s): "CKTOTAL", "CKMB", "CKMBINDEX", "TROPONINI" in the last 168 hours.  HbA1C: Hemoglobin A1C  Date/Time Value Ref Range Status  09/03/2023 09:19 AM 8.1 (A) 4.0 - 5.6 % Final  05/03/2023 08:50 AM 8.2 (A) 4.0 - 5.6 % Final   Hgb A1c  MFr Bld  Date/Time Value Ref Range Status  02/09/2022 11:00 AM 8.1 (H) 4.8 - 5.6 % Final    Comment:             Prediabetes: 5.7 - 6.4          Diabetes: >6.4          Glycemic control for adults with diabetes: <7.0   08/03/2017 07:19 AM 11.0 (H) 4.8 - 5.6 % Final    Comment:    (NOTE) Pre diabetes:          5.7%-6.4% Diabetes:              >6.4% Glycemic control for   <7.0% adults with diabetes     CBG: Recent Labs  Lab 10/25/23 1038 10/25/23 1602 10/25/23 2107 10/26/23 0426 10/26/23 0557  GLUCAP 200*  219* 175* 120* 141*    Cyril Mourning MD. FCCP. [facility] Pager : [phone]  If no response to pager , please call [phone] until 7 pm After 7:00 pm call Elink  [phone]   10/26/2023

## 2023-10-26 NOTE — Plan of Care (Signed)
  Problem: Metabolic: Goal: Ability to maintain appropriate glucose levels will improve Outcome: Progressing   Problem: Education: Goal: Knowledge of General Education information will improve Description: Including pain rating scale, medication(s)/side effects and non-pharmacologic comfort measures Outcome: Progressing   Problem: Coping: Goal: Level of anxiety will decrease Outcome: Progressing

## 2023-10-26 NOTE — Progress Notes (Addendum)
Advanced Heart Failure Rounding Note  PCP-Cardiologist: Donato Schultz, MD   Subjective:    Feels ok today. Does have conversation dyspnea and pursed lip breathing. NPO for cath today.   Remains on 40L HHFNC with stats in high 80s  Objective:   Weight Range: 99.3 kg Body mass index is 34.29 kg/m.   Vital Signs:   Temp:  [97.3 F (36.3 C)-99.2 F (37.3 C)] 97.7 F (36.5 C) (12/03 0730) Pulse Rate:  [50-63] 52 (12/03 0730) Resp:  [17-23] 20 (12/03 0730) BP: (134-148)/(60-66) 139/65 (12/03 0730) SpO2:  [90 %-99 %] 90 % (12/03 0730) FiO2 (%):  [60 %-100 %] 100 % (12/03 0753) Weight:  [99.3 kg] 99.3 kg (12/03 0522) Last BM Date : 10/24/23  Weight change: Filed Weights   10/24/23 0555 10/25/23 0500 10/26/23 0522  Weight: 99.7 kg 97.5 kg 99.3 kg    Intake/Output:   Intake/Output Summary (Last 24 hours) at 10/26/2023 0845 Last data filed at 10/26/2023 0408 Gross per 24 hour  Intake 422.26 ml  Output 1950 ml  Net -1527.74 ml    Physical Exam  General:  frail appearing.  +conversational dyspnea. + pursed lip breathing  HEENT: +HHFNC Neck: supple. JVD difficult to see. Carotids 2+ bilat; no bruits. No lymphadenopathy or thyromegaly appreciated. Cor: PMI nondisplaced. Regular rate & rhythm. No rubs, gallops or murmurs. Lungs: diminished Abdomen: obese, soft, nontender, nondistended. No hepatosplenomegaly. No bruits or masses. Good bowel sounds. Extremities: no cyanosis, clubbing, rash, non-pitting BLE edema  Neuro: alert & oriented x 3, cranial nerves grossly intact. moves all 4 extremities w/o difficulty. Affect pleasant.   Telemetry   SB 50s (Personally reviewed)    EKG    No new EKG to review  Labs    CBC Recent Labs    10/24/23 0244  WBC 7.9  HGB 9.9*  HCT 33.9*  MCV 87.4  PLT 218   Basic Metabolic Panel Recent Labs    82/95/62 1008 10/26/23 0245  NA 136 135  K 4.1 3.6  CL 90* 88*  CO2 36* 37*  GLUCOSE 210* 174*  BUN 48* 49*  CREATININE  1.30* 1.34*  CALCIUM 9.1 8.9  MG 2.0 1.8   Liver Function Tests Recent Labs    10/24/23 1959  AST 14*  ALT 13  ALKPHOS 63  BILITOT 0.5  PROT 5.8*  ALBUMIN 2.7*   No results for input(s): "LIPASE", "AMYLASE" in the last 72 hours. Cardiac Enzymes No results for input(s): "CKTOTAL", "CKMB", "CKMBINDEX", "TROPONINI" in the last 72 hours.  BNP: BNP (last 3 results) Recent Labs    08/06/23 0817 10/16/23 1420 10/24/23 1352  BNP 320.4* 2,554.5* 817.1*    ProBNP (last 3 results) No results for input(s): "PROBNP" in the last 8760 hours.   D-Dimer No results for input(s): "DDIMER" in the last 72 hours. Hemoglobin A1C No results for input(s): "HGBA1C" in the last 72 hours. Fasting Lipid Panel No results for input(s): "CHOL", "HDL", "LDLCALC", "TRIG", "CHOLHDL", "LDLDIRECT" in the last 72 hours. Thyroid Function Tests No results for input(s): "TSH", "T4TOTAL", "T3FREE", "THYROIDAB" in the last 72 hours.  Invalid input(s): "FREET3"  Other results:   Imaging    DG CHEST PORT 1 VIEW  Result Date: 10/25/2023 CLINICAL DATA:  Acute respiratory failure and hypoxia. EXAM: PORTABLE CHEST 1 VIEW COMPARISON:  Chest radiograph dated 10/23/2023. FINDINGS: Cardiomegaly with vascular congestion and edema. Superimposed pneumonia is not excluded. Small bilateral pleural effusions and bibasilar atelectasis. Overall no significant interval change since the  prior radiograph. No pneumothorax. No acute osseous pathology. IMPRESSION: No significant interval change. Electronically Signed   By: Elgie Collard M.D.   On: 10/25/2023 18:02   ECHOCARDIOGRAM LIMITED BUBBLE STUDY  Result Date: 10/25/2023    ECHOCARDIOGRAM LIMITED REPORT   Patient Name:   Alicia [name] Date of Exam: 10/25/2023 Medical Rec #:  [id number]       Height:       67.0 in Accession #:    [id number]      Weight:       214.9 lb Date of Birth:  01-Dec-1955        BSA:          2.085 m Patient Age:    67 years        BP:            151/62 mmHg Patient Gender: F               HR:           55 bpm. Exam Location:  Inpatient Procedure: Limited Echo, Saline Contrast Bubble Study and Intracardiac            Opacification Agent Indications:    Respiratory failure (HCC)  History:        Patient has prior history of Echocardiogram examinations, most                 recent 10/17/2023. CHF, COPD; Risk Factors:Diabetes.  Sonographer:    Darlys Gales Referring Phys: [id number] Bettina Gavia DEWALD IMPRESSIONS  1. Limited echo for respiratory failure  2. Technically difficult study with poor echo windows, despite Definity image enhancing agent.  3. Left ventricular ejection fraction, by estimation, is 45 to 50%. The left ventricle has mildly decreased function. The left ventricle demonstrates regional wall motion abnormalities (see scoring diagram/findings for description). There is moderate left ventricular hypertrophy. There is moderate hypokinesis of the left ventricular, basal-mid inferior wall.  4. The aortic valve is tricuspid. Aortic valve regurgitation is not visualized.  5. The inferior vena cava is normal in size with greater than 50% respiratory variability, suggesting right atrial pressure of 3 mmHg.  6. Bubble study was performed but not interpretable. Comparison(s): Changes from prior study are noted. 10/17/2023: LVEF 50-55%. FINDINGS  Left Ventricle: Left ventricular ejection fraction, by estimation, is 45 to 50%. The left ventricle has mildly decreased function. The left ventricle demonstrates regional wall motion abnormalities. Moderate hypokinesis of the left ventricular, basal-mid inferior wall. Definity contrast agent was given IV to delineate the left ventricular endocardial borders. The left ventricular internal cavity size was normal in size. There is moderate left ventricular hypertrophy.  LV Wall Scoring: The posterior wall is hypokinetic. Pericardium: Trivial pericardial effusion is present. The pericardial effusion is circumferential.  Aortic Valve: The aortic valve is tricuspid. Aortic valve regurgitation is not visualized. Venous: The inferior vena cava is normal in size with greater than 50% respiratory variability, suggesting right atrial pressure of 3 mmHg. IAS/Shunts: Agitated saline contrast was given intravenously to evaluate for intracardiac shunting. LEFT VENTRICLE PLAX 2D LVIDd:         5.20 cm LVIDs:         3.90 cm LV PW:         1.40 cm LV IVS:        1.40 cm  Zoila Shutter MD Electronically signed by Zoila Shutter MD Signature Date/Time: 10/25/2023/12:06:53 PM    Final      Medications:  Scheduled Medications:  amLODipine  10 mg Oral Daily   apixaban  5 mg Oral BID   arformoterol  15 mcg Nebulization BID   atorvastatin  80 mg Oral Daily   budesonide (PULMICORT) nebulizer solution  0.5 mg Nebulization BID   calcium carbonate  1 tablet Oral Daily   ezetimibe  10 mg Oral Daily   insulin aspart  0-15 Units Subcutaneous TID WC   insulin aspart  0-5 Units Subcutaneous QHS   insulin glargine-yfgn  30 Units Subcutaneous QHS   pantoprazole  40 mg Oral Daily   revefenacin  175 mcg Nebulization Daily   sodium chloride flush  10 mL Intravenous Q12H    Infusions:  furosemide 120 mg (10/25/23 2043)    PRN Medications: acetaminophen **OR** acetaminophen, guaiFENesin-dextromethorphan, hydrOXYzine, ondansetron (ZOFRAN) IV, phenol, polyethylene glycol  Patient Profile   67 y.o. female with history of HFrEF with recovered EF, chronic respiratory failure on supplemental O2, COPD, CAD, DM II, HTN. Admitted with acute on chronic respiratory failure 2/2 a/c HFpEF and COPDE.   Assessment/Plan  1. Acute on chronic HF with mid-range EF with prominent RV failure and pulmonary hypertension: Echoes are technically difficult, but from synthesis of several recent echoes it appears that EF is in the 45-50% range with mild-moderate LVH, RV not well-visualized but PASP 94 on the the 9/24 echo.  Suspect cor pulmonale.  She does not  look markedly volume overloaded on exam and IVC not dilated on most recent echo. She is still markedly hypoxemic on high flow oxygen.  Suspect that underlying lung disease is the main problem at this point => COPD with suspected OHS/OSA.   - She has been high dose IV Lasix + acetazolamide + metolazone. Stop today. RHC today.  Will guide diuretics by RHC findings. We discussed risks/benefits and she agrees to RHC.  Cath consent: The patient understands that risks included but are not limited to stroke (1 in 1000), death (1 in 1000), kidney failure [usually temporary] (1 in 500), bleeding (1 in 200), allergic reaction [possibly serious] (1 in 200).  The patient understands and agrees to proceed.   - She will need shunt run given persistent hypoxemia.  Bubble study on echo 12/2 was not interpretable.  2. Pulmonary hypertension: PASP 94 mmHg on 9/24 echo.  Suspect group 3 + possible group 2 PH in setting of COPD and suspected OHS/OSA. CTA chest this admission with no PE.  - Assess with RHC today as above.  - Do not think that she would be a pulmonary vasodilator candidate (doubt group 1 PH).  - Would ideally get eventual sleep study.  3. Acute on chronic hypoxemic respiratory failure: 8L home oxygen, on HFNC here.  Suspect COPD + OHS/OSA.  No longer smokes.  Worry that she has end stage lung disease.  She is now DNR/DNI.  - Continues to fail O2 weans. Sats high 80s on 40L HHFNC 4. CAD: Coronary CTA in 8/23 with moderate stenosis in D1 and OM1 (small caliber, medically managed), diffuse disease.  No chest pain.  - No ASA given apixaban.  - Continue atorvastatin and Zetia.  5. Atrial fibrillation: Paroxysmal.  Has been in NSR here.  - Continue apixaban, hold am dose for cath.  6. GOC - Poor prognosis, now DNR/DNI - Consider palliative care consult  Length of Stay: 10  Alen Bleacher, NP  10/26/2023, 8:45 AM  Advanced Heart Failure Team Pager [phone] (M-F; 7a - 5p)  Please contact [facility] for  night-coverage  after hours (5p -7a ) and weekends on [email or web address]   Patient seen with NP, agree with the above note with the following changes.    Patient is on Bipap currently, oxygen saturation still in upper 80s.  Some diuresis with IV Lasix yesterday, creatinine stable at 1.34.   General: NAD Neck: JVP 7-8 cm, no thyromegaly or thyroid nodule.  Lungs: Distant BS CV: Nondisplaced PMI.  Heart regular S1/S2, no S3/S4, no murmur.  No peripheral edema.    Abdomen: Soft, nontender, no hepatosplenomegaly, no distention.  Skin: Intact without lesions or rashes.  Neurologic: Alert and oriented x 3.  Psych: Normal affect. Extremities: No clubbing or cyanosis.  HEENT: Normal.   Ongoing acute on chronic hypoxemic respiratory failure, now on Bipap.  I do not think she is particularly volume overloaded at this point based on exam.  I spoke with Dr. Vassie Loll today, suspect primary issue is pulmonary.  We decided not to do RHC as it probably is not going to change management.  Can give IV Lasix today, switch to torsemide 60 mg bid tomorrow.    Cardiology will follow at a distance, call with questions.   Marca Ancona 10/26/2023 3:30 PM

## 2023-10-26 NOTE — Progress Notes (Signed)
IV team consult. Arrived to patient room. Patient care being performed. Notified nurse to re-consult when patient is available. Tomasita Morrow, RN VAST

## 2023-10-26 NOTE — Progress Notes (Signed)
HD#10 SUBJECTIVE:  Patient Summary: Alicia [name] is a 67 y.o. 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   Overnight Events: None  Interim History: Patient is evaluate bedside this morning, she reports she is doing okay.  She reports that she is still short of breath however she is managing.  No other abdominal pain, chest pain.  She did report that she felt nauseous yesterday and she was unable to eat however denies any vomiting.  Otherwise this morning she feels no nausea or vomiting.  OBJECTIVE:  Vital Signs: Vitals:   10/26/23 0430 10/26/23 0522 10/26/23 0644 10/26/23 0730  BP:    139/65  Pulse: (!) 55  (!) 54 (!) 52  Resp: (!) 21   20  Temp:    97.7 F (36.5 C)  TempSrc:    Oral  SpO2: 93%  94% 90%  Weight:  99.3 kg    Height:       Supplemental O2:  HHFNC SpO2: 90 % O2 Flow Rate (L/min): (S) 50 L/min FiO2 (%): (S) 100 %  Filed Weights   10/24/23 0555 10/25/23 0500 10/26/23 0522  Weight: 99.7 kg 97.5 kg 99.3 kg     Intake/Output Summary (Last 24 hours) at 10/26/2023 0816 Last data filed at 10/26/2023 0408 Gross per 24 hour  Intake 782.26 ml  Output 2450 ml  Net -1667.74 ml   Net IO Since Admission: -6,237.44 mL [10/26/23 0816]  Physical Exam: Physical Exam  General: Ill-appearing, sitting in bed, no acute distress Head: Normocephalic, atraumatic, wearing HHFNC 40 L Cardiac: Regular rate, regular rhythm, no pitting edema bilateral lower extremities Pulmonary: + bibasilar crackles  Abdomen: Soft, not tender, nondistended, bowel sounds  present Neuro: Awake, alert, no focal deficit  Patient Lines/Drains/Airways Status     Active Line/Drains/Airways     Name Placement date Placement time Site Days   Peripheral IV 10/25/23 20 G 1.88" Anterior;Left Forearm 10/25/23  1014  Forearm  1   Peripheral IV 10/25/23 22 G 2.5" Anterior;Right Forearm 10/25/23  2352  Forearm  1   External Urinary Catheter 10/25/23  1610  --  1   Wound / Incision (Open or Dehisced) 10/20/23 (IAD) Incontinence Associated Dermatitis Buttocks Right;Left 10/20/23  1044  Buttocks  6             ASSESSMENT/PLAN:  Assessment: Active Problems:   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 person with past medical history of chronic hypoxic respiratory failure on 8 L nasal cannula at baseline, COPD, heart failure recovered ejection fraction (EF 50 to 60%), type 2 diabetes with diabetic retinopathy, SA node dysfunction, obesity, CVA, with recent hospital admission for COPD  exacerbation presents today for 1 week of worsening shortness of breath at baseline and fatigue, admitted for acute on chronic hypoxic respiratory failure secondary to heart failure exacerbation.   Plan: #Acute on chronic hypoxic/hypercarbic respiratory failure (home O2 8L ) #Acute on Chronic HF Exacerbation (EF 50-55%) #Acute COPD exacerbation   Presented with worsening dyspnea and fatigue for 1 week with increasing oxygen requirement.  Initial chest x-ray showed mild pulmonary vascular congestion with small bilateral  layering pleural effusion. BNP >2000; initial VBG showed pH 7.3, bicarb 39.2, pCO2 73.2. Lung exam notable for bilateral rales and bibasilar crackles. She also had 1+ pitting edema below mid-shin bilaterally. Echo EF 45 to 50%, decreased from echo findings on 11/24.   Per evaluation today, patient was initially planned for right heart catheterization however the procedure was canceled because patient is too unstable and very risky given her severe hypoxemia, therefore RHC cancelled. We will continue diuresis, however at this point, consider palliative conversation.    Plan: - HHFNC with goal O2 > 88% - IV Lasix 120 mg 3 times daily today - One-time dose of Metolazone 5 mg, give one hour before lasix  - Continue Yupelri, Budesonide and Atrovent, breathing treatments  - BiPAP at bedtime and as needed, NPO when on BiPAP - Daily weight checks with Strict Is & Os - PT/OT recommended Home health PT/OT  - Advanced heart failure team and PCCM following, appreciate their recs.   - GOAL Mg > 2.0 and Potassium > 4.0  - Palliative care consulted    #AKI #Contraction Alkalosis  #Cardiorenal syndrome  Presented with Cr 1.13, elevated from 0.75 on 09/03/2023 likely pre-renal, in the setting of HF exacerbation. Cr continues to trend higher in the setting of high dose lasix; She also had concerns of straining while urinating in the ED. UA negative infection. Bladder scan on 11/24 showed 0 PVR. There is likely a component of cardiorenal syndrome that is playing in role in her AKI in the setting of worsening HF exacerbation.  - Continue to trent BMP    Chronic Conditions:  Type 2 Diabetes with nonproliferative diabetic retinopathy: Semglee 30 units with moderate SSI HLD: Continue lipitor 80 mg daily and zetia 10 mg daily Paroxysmal A fib: Currently NSR, resumed Eliquis 5 mg BID HTN: Resumed home medication amlodipine 10 mg daily.    Best Practice: Diet: Cardiac diet IVF: Fluids: None, Rate: None VTE:  Eliquis BID Code: DNR AB: None Family Contact: Daughter, called and notified. DISPO: Anticipated discharge  3-6 days  to Home pending New oxygen and medical management .  Signature: Jeral Pinch, D.O.  Internal Medicine Resident, PGY-1 [facility]  Pager: [phone] 8:16 AM, 10/26/2023   Please contact the on call pager after 5 pm and on weekends at [phone].

## 2023-10-26 NOTE — TOC Progression Note (Signed)
Transition of Care [facility]) - Progression Note    Patient Details  Name: Alicia [name] MRN: [id number] Date of Birth: 1956/02/27  Transition of Care [facility]) CM/SW Contact  Nicanor Bake Phone Number: [phone] 10/26/2023, 2:23 PM  Clinical Narrative:  2:04 pm- HF CSW attempted to meet with pt at bedside. Pt was being seen by nurse in the room. CSW will follow up with pt at a more appropriate time.   TOC will continue following.       Expected Discharge Plan: Home w Home Health Services Barriers to Discharge: Continued Medical Work up  Expected Discharge Plan and Services In-house Referral: NA Discharge Planning Services: CM Consult Post Acute Care Choice: Resumption of Svcs/PTA Provider, Home Health Living arrangements for the past 2 months: Single Family Home                 DME Arranged: N/A DME Agency: NA       HH Arranged: PT, OT HH Agency: [facility] Date [facility] Agency Contacted: 10/18/23 Time HH Agency Contacted: 1759 Representative spoke with at [facility] Agency: Kandee Keen   Social Determinants of Health (SDOH) Interventions SDOH Screenings   Food Insecurity: No Food Insecurity (10/16/2023)  Housing: Patient Unable To Answer (10/16/2023)  Transportation Needs: Unmet Transportation Needs (10/16/2023)  Utilities: Not At Risk (10/16/2023)  Alcohol Screen: Low Risk  (10/18/2023)  Depression (PHQ2-9): Low Risk  (09/06/2023)  Financial Resource Strain: Low Risk  (10/18/2023)  Physical Activity: Inactive (05/19/2023)  Social Connections: Socially Isolated (05/19/2023)  Stress: No Stress Concern Present (05/19/2023)  Tobacco Use: Medium Risk (10/16/2023)    Readmission Risk Interventions    08/10/2023   12:29 PM 07/28/2023   11:44 AM  Readmission Risk Prevention Plan  Transportation Screening Complete Complete  Medication Review Oceanographer) Complete Complete  PCP or Specialist appointment within 3-5 days of discharge Complete   HRI or Home Care  Consult Complete Complete  SW Recovery Care/Counseling Consult  Complete  Palliative Care Screening Not Applicable Not Applicable  Skilled Nursing Facility Not Applicable Not Applicable

## 2023-10-26 NOTE — Progress Notes (Signed)
RT attempted to wean HHFNC but Pt did not tolerated. RT placed Pt back on 50L 100% due to desaturation in mid 80s. Pt now VSS at this time

## 2023-10-27 DIAGNOSIS — J9622 Acute and chronic respiratory failure with hypercapnia: Secondary | ICD-10-CM | POA: Diagnosis not present

## 2023-10-27 DIAGNOSIS — J439 Emphysema, unspecified: Secondary | ICD-10-CM | POA: Diagnosis not present

## 2023-10-27 DIAGNOSIS — J441 Chronic obstructive pulmonary disease with (acute) exacerbation: Secondary | ICD-10-CM | POA: Diagnosis not present

## 2023-10-27 DIAGNOSIS — J449 Chronic obstructive pulmonary disease, unspecified: Secondary | ICD-10-CM | POA: Diagnosis not present

## 2023-10-27 DIAGNOSIS — I5032 Chronic diastolic (congestive) heart failure: Secondary | ICD-10-CM

## 2023-10-27 DIAGNOSIS — Z515 Encounter for palliative care: Secondary | ICD-10-CM | POA: Diagnosis not present

## 2023-10-27 DIAGNOSIS — J9621 Acute and chronic respiratory failure with hypoxia: Secondary | ICD-10-CM | POA: Diagnosis not present

## 2023-10-27 DIAGNOSIS — I5021 Acute systolic (congestive) heart failure: Secondary | ICD-10-CM | POA: Diagnosis not present

## 2023-10-27 LAB — BASIC METABOLIC PANEL
Anion gap: 9 (ref 5–15)
BUN: 49 mg/dL — ABNORMAL HIGH (ref 8–23)
CO2: 39 mmol/L — ABNORMAL HIGH (ref 22–32)
Calcium: 8.9 mg/dL (ref 8.9–10.3)
Chloride: 89 mmol/L — ABNORMAL LOW (ref 98–111)
Creatinine, Ser: 1.42 mg/dL — ABNORMAL HIGH (ref 0.44–1.00)
GFR, Estimated: 41 mL/min — ABNORMAL LOW (ref >60)
Glucose, Bld: 106 mg/dL — ABNORMAL HIGH (ref 70–99)
Potassium: 3.5 mmol/L (ref 3.5–5.1)
Sodium: 137 mmol/L (ref 135–145)

## 2023-10-27 LAB — GLUCOSE, CAPILLARY
Glucose-Capillary: 77 mg/dL (ref 70–99)
Glucose-Capillary: 125 mg/dL — ABNORMAL HIGH (ref 70–99)
Glucose-Capillary: 158 mg/dL — ABNORMAL HIGH (ref 70–99)
Glucose-Capillary: 163 mg/dL — ABNORMAL HIGH (ref 70–99)

## 2023-10-27 LAB — MAGNESIUM: Magnesium: 2.3 mg/dL (ref 1.7–2.4)

## 2023-10-27 MED ORDER — POTASSIUM CHLORIDE CRYS ER 20 MEQ PO TBCR
40.0000 meq | EXTENDED_RELEASE_TABLET | Freq: Once | ORAL | Status: AC
  Administered 2023-10-27: 40 meq via ORAL
  Filled 2023-10-27: qty 2

## 2023-10-27 NOTE — Progress Notes (Signed)
Physical Therapy Treatment Patient Details Name: Alicia [name] MRN: [id number] DOB: 10/25/1956 Today's Date: 10/27/2023   History of Present Illness Pt is 67 yo presenting to [facility] ED on 11/23 due to worsening shortness of breath and fatigue and admitted for acute on chronic hypoxic respiratory failure secondary to CHF exacerbation. PMH: COPD, CHF, DM-2, diabetic retinopathy, SA node dysfunction, Hx CVA, CAD, and recent admissions for PNA and COVID-19 in 06/2023 and 07/2023.    PT Comments  Pt continues to fatigue quickly and desat with exertion, only tolerating standing up to ~15-20 seconds before needing to sit due to these reasons. Focused session on lower extremity strengthening and endurance training to try to progress her standing tolerance. Will continue to follow acutely.    If plan is discharge home, recommend the following: A lot of help with walking and/or transfers;A little help with bathing/dressing/bathroom;Help with stairs or ramp for entrance;Assistance with cooking/housework;Assist for transportation   Can travel by private vehicle        Equipment Recommendations  Wheelchair (measurements PT);Wheelchair cushion (measurements PT);BSC/3in1;Hospital bed    Recommendations for Other Services       Precautions / Restrictions Precautions Precautions: Fall Precaution Comments: watch O2, on HHFNC Restrictions Weight Bearing Restrictions: No     Mobility  Bed Mobility               General bed mobility comments: Pt sitting up in chair upon arrival and at end of session    Transfers Overall transfer level: Needs assistance Equipment used: Rolling walker (2 wheels) Transfers: Sit to/from Stand Sit to Stand: Min assist, Contact guard assist           General transfer comment: Pt able to power up to stand from recliner 2x with CGA but needed minA to control her descend to sit.    Ambulation/Gait Ambulation/Gait assistance: Min assist, Contact guard assist Gait  Distance (Feet): 1 Feet Assistive device: Rolling walker (2 wheels) Gait Pattern/deviations: Step-to pattern, Decreased step length - right, Decreased step length - left, Decreased stride length, Trunk flexed Gait velocity: reduced Gait velocity interpretation: <1.31 ft/sec, indicative of household ambulator   General Gait Details: Pt able to take steps in place and a step or two anterior/posterior before fatiguing. Unable to ambulate further though due to desatting and fatigue. CGA-minA for balance.   Stairs             Wheelchair Mobility     Tilt Bed    Modified Rankin (Stroke Patients Only)       Balance Overall balance assessment: Needs assistance Sitting-balance support: No upper extremity supported Sitting balance-Leahy Scale: Good     Standing balance support: Bilateral upper extremity supported Standing balance-Leahy Scale: Poor Standing balance comment: UE support for balance                            Cognition Arousal: Alert Behavior During Therapy: WFL for tasks assessed/performed Overall Cognitive Status: Within Functional Limits for tasks assessed                                          Exercises General Exercises - Lower Extremity Ankle Circles/Pumps: AROM, Both, 10 reps, Seated Long Arc Quad: AROM, Strengthening, Both, 10 reps, Seated Hip ABduction/ADduction: AROM, Strengthening, 5 reps, 10 reps, Seated (against manual resistance from PT) Hip Flexion/Marching:  AROM, Strengthening, Both, 10 reps, Seated, Standing, 5 reps (x10 reps seated, ~5x standing with RW)    General Comments General comments (skin integrity, edema, etc.): SpO2 dropping to 80s% with exertion on HHFNC, recovered to 90s% with seated rest breaks      Pertinent Vitals/Pain Pain Assessment Pain Assessment: Faces Faces Pain Scale: No hurt Pain Intervention(s): Monitored during session    Home Living                          Prior  Function            PT Goals (current goals can now be found in the care plan section) Acute Rehab PT Goals Patient Stated Goal: to return home PT Goal Formulation: With patient/family Time For Goal Achievement: 10/29/2023 Potential to Achieve Goals: Fair Progress towards PT goals: Progressing toward goals    Frequency    Min 1X/week      PT Plan      Co-evaluation              AM-PAC PT "6 Clicks" Mobility   Outcome Measure  Help needed turning from your back to your side while in a flat bed without using bedrails?: A Little Help needed moving from lying on your back to sitting on the side of a flat bed without using bedrails?: A Little Help needed moving to and from a bed to a chair (including a wheelchair)?: A Little Help needed standing up from a chair using your arms (e.g., wheelchair or bedside chair)?: A Little Help needed to walk in hospital room?: Total Help needed climbing 3-5 steps with a railing? : Total 6 Click Score: 14    End of Session Equipment Utilized During Treatment: Gait belt;Oxygen Activity Tolerance: Patient limited by fatigue Patient left: in chair;with call bell/phone within reach;with family/visitor present;Other (comment) (with MD) Nurse Communication: Mobility status PT Visit Diagnosis: Other abnormalities of gait and mobility (R26.89);Muscle weakness (generalized) (M62.81);Unsteadiness on feet (R26.81);Difficulty in walking, not elsewhere classified (R26.2)     Time: 1610-9604 PT Time Calculation (min) (ACUTE ONLY): 22 min  Charges:    $Therapeutic Exercise: 8-22 mins PT General Charges $$ ACUTE PT VISIT: 1 Visit                     Virgil Benedict, PT, DPT Acute Rehabilitation Services  Office: [phone]    Bettina Gavia 10/27/2023, 4:34 PM

## 2023-10-27 NOTE — Progress Notes (Signed)
Mobility Specialist Progress Note:   10/27/23 1146  Mobility  Activity Dangled on edge of bed  Level of Assistance Minimal assist, patient does 75% or more  Assistive Device Other (Comment) (HHA)  Activity Response Tolerated well  Mobility Referral Yes  $Mobility charge 1 Mobility  Mobility Specialist Start Time (ACUTE ONLY) [id number]  Mobility Specialist Stop Time (ACUTE ONLY) 0908  Mobility Specialist Time Calculation (min) (ACUTE ONLY) 10 min   Pt received supine in bed requesting assistance getting to EOB. Pt needed MinA w/ HHA to get to EOB. No c/o throughout. Call bell and personal belongings in reach. All needs met. Bed alarm on.  Pre Mobility SPO2 94% 50L/min HHFNC During Mobility SPO2 88% 50L/min HHFNC Post Mobility SPO2 91% 50L/min HHFNC  Thompson Grayer Mobility Specialist  Please contact vis Secure Chat or  Rehab Office [phone]

## 2023-10-27 NOTE — Plan of Care (Signed)
  Problem: Fluid Volume: Goal: Ability to maintain a balanced intake and output will improve Outcome: Progressing   Problem: Metabolic: Goal: Ability to maintain appropriate glucose levels will improve Outcome: Progressing   Problem: Skin Integrity: Goal: Risk for impaired skin integrity will decrease Outcome: Progressing   Problem: Fluid Volume: Goal: Ability to maintain a balanced intake and output will improve Outcome: Progressing   Problem: Metabolic: Goal: Ability to maintain appropriate glucose levels will improve Outcome: Progressing   Problem: Skin Integrity: Goal: Risk for impaired skin integrity will decrease Outcome: Progressing

## 2023-10-27 NOTE — Progress Notes (Signed)
NAME:  Alicia [name], MRN:  [id number], DOB:  12/06/1955, LOS: 11 ADMISSION DATE:  10/16/2023, CONSULTATION DATE:  10/24/23 REFERRING MD:  Merrilee Jansky - IMTS, CHIEF COMPLAINT:  hypoxia    History of Present Illness:  67 yo f PMH COPD, Chronic hypoxia (reportedly on 8L at home, last time she saw pulm was 4L), HF recovered EF, CVA, HLD, DM2, HTN, Obesity who was admitted to IMTS 11/23 for SOB and AoC hypoxia, and has been progressively diuresed 11/23-12/1 for a HF exacerbation with initial cxr c/w volume overload and BNP >2000. She has required an escalation of O2 from her baseline during this admission and at times BiPAP vs HHFNC.  On 11/30 she had a near-arrest, becoming bradycardic + associated worsening hypoxia & required atropine.   Despite escalating diuresis, pt remains hypoxic. On 12/1 PCCM is consulted in this setting.  Pertinent  Medical History  COPD Chronic hypoxia HFpEF  Significant Hospital Events: Including procedures, antibiotic start and stop dates in addition to other pertinent events   11/23  admit to IMTS on BiPAP,40 mg lasix in ED then  80 lasix after admission.  11/24 80 lasix BID -- 900cc UOP 11/23-24 11/25 given 1x solumedrol and started on pred for possible AECOPD. Lasix was stopped for AKI (1.44 from 1.3 from 1.1) 11/26 lasix restarted 80 BID 11/27 lasix increased to 120 TID. Pred stopped.  Cr 1.45 11/28 diamox added, lasix de-escalated to 120 BID. Improved renal fxn cr 1.08   11/29 pt apparently brought up end stage dz process questions. Primary plans to organize family meeting  11/30 a code blue was calle d, she did not lose pulses but did require atropine for symptomatic bradycardia. Family meeting later that day, remains full code full scope of offered care.  12/1 PCCM consulted given lack of clinical improvement & ongoing desat events.   Interim History / Subjective:  -1300 cc   Objective   Blood pressure (!) 141/64, pulse (!) 56, temperature (!) 97.1 F  (36.2 C), temperature source Oral, resp. rate 20, height 5\' 7"  (1.702 m), weight 97.3 kg, SpO2 93%.    Vent Mode: PCV FiO2 (%):  [60 %-95 %] 90 % Set Rate:  [15 bmp] 15 bmp   Intake/Output Summary (Last 24 hours) at 10/27/2023 1022 Last data filed at 10/27/2023 0850 Gross per 24 hour  Intake 788.85 ml  Output 585 ml  Net 203.85 ml   Filed Weights   10/25/23 0500 10/26/23 0522 10/27/23 0456  Weight: 97.5 kg 99.3 kg 97.3 kg    Examination: Morbidly obese female is sitting on side of the bed mild respiratory distress No obvious JVD Remains on high flow oxygen greater 90% Decreased breath sounds throughout Heart sounds are distant Abdomen is obese soft nontender Remedy with 3-4+ edema  Labs show mild hypokalemia 3.6, BUN/creatinine 19 and 49/1.3, no leukocytosis  Resolved Hospital Problem list     Assessment & Plan:   Acute on chronic hypoxic and hypercarbic respiratory failure-baseline 8 L oxygen AoC HFrEF COPD Bilateral pleural effusions vs atelectasis AKI Lab Results  Component Value Date   CREATININE 1.42 (H) 10/27/2023   CREATININE 1.34 (H) 10/26/2023   CREATININE 1.30 (H) 10/25/2023   CREATININE 0.63 08/28/2015   CREATININE 0.44 (L) 02/27/2014   CREATININE 0.46 (L) 11/01/2013    Discussion She has severe hypoxia in spite of 6 L negative balance, no evidence of pneumonia.  BNP remains slight high PFTs 06/2018 has shown moderate to severe restriction with FEV1 45%  and FVC 43%.  She quit smoking in 2020.  Have to rule out right-to-left shunt Previous echo showed severe pulm hypertension, bubble study indeterminate  Plan Noninvasive mechanical ventilatory support as tolerated Continues to require over 90% high flow oxygen DNR/DNI Diuresis as tolerated Bronchodilators Pulmonary toilet as tolerated       Would continue palliative conversations, we do not have other therapeutic options  Best Practice (right click and "Reselect all SmartList Selections"  daily)   Per primary team  Labs   CBC: Recent Labs  Lab 10/24/23 0244 10/26/23 0801  WBC 7.9 8.5  HGB 9.9* 10.5*  HCT 33.9* 34.6*  MCV 87.4 84.2  PLT 218 180    Basic Metabolic Panel: Recent Labs  Lab 10/22/23 0229 10/23/23 0213 10/24/23 0244 10/25/23 1008 10/26/23 0245 10/27/23 0231  NA 139 137 139 136 135 137  K 4.4 3.9 4.1 4.1 3.6 3.5  CL 95* 95* 95* 90* 88* 89*  CO2 37* 35* 35* 36* 37* 39*  GLUCOSE 156* 181* 85 210* 174* 106*  BUN 53* 53* 52* 48* 49* 49*  CREATININE 1.29* 1.17* 1.25* 1.30* 1.34* 1.42*  CALCIUM 8.7* 8.6* 9.1 9.1 8.9 8.9  MG 2.0 2.0  --  2.0 1.8 2.3   GFR: Estimated Creatinine Clearance: 47.3 mL/min (A) (by C-G formula based on SCr of 1.42 mg/dL (H)). Recent Labs  Lab 10/24/23 0244 10/24/23 1556 10/26/23 0801  PROCALCITON  --  <0.10  --   WBC 7.9  --  8.5    Liver Function Tests: Recent Labs  Lab 10/24/23 1959  AST 14*  ALT 13  ALKPHOS 63  BILITOT 0.5  PROT 5.8*  ALBUMIN 2.7*   No results for input(s): "LIPASE", "AMYLASE" in the last 168 hours. No results for input(s): "AMMONIA" in the last 168 hours.  ABG    Component Value Date/Time   PHART 7.39 10/23/2023 1939   PCO2ART 68 (HH) 10/23/2023 1939   PO2ART 68 (L) 10/23/2023 1939   HCO3 41.8 (H) 10/23/2023 1939   TCO2 41 (H) 10/16/2023 1428   O2SAT 95.3 10/23/2023 1939     Coagulation Profile: No results for input(s): "INR", "PROTIME" in the last 168 hours.  Cardiac Enzymes: No results for input(s): "CKTOTAL", "CKMB", "CKMBINDEX", "TROPONINI" in the last 168 hours.  HbA1C: Hemoglobin A1C  Date/Time Value Ref Range Status  09/03/2023 09:19 AM 8.1 (A) 4.0 - 5.6 % Final  05/03/2023 08:50 AM 8.2 (A) 4.0 - 5.6 % Final   Hgb A1c MFr Bld  Date/Time Value Ref Range Status  02/09/2022 11:00 AM 8.1 (H) 4.8 - 5.6 % Final    Comment:             Prediabetes: 5.7 - 6.4          Diabetes: >6.4          Glycemic control for adults with diabetes: <7.0   08/03/2017 07:19 AM  11.0 (H) 4.8 - 5.6 % Final    Comment:    (NOTE) Pre diabetes:          5.7%-6.4% Diabetes:              >6.4% Glycemic control for   <7.0% adults with diabetes     CBG: Recent Labs  Lab 10/26/23 0557 10/26/23 1051 10/26/23 1557 10/26/23 2058 10/27/23 0551  GLUCAP 141* 98 141* 158* 77    Steve Stormy Connon ACNP Acute Care Nurse Practitioner [facility] Please consult Amion 10/27/2023, 10:22 AM

## 2023-10-27 NOTE — Plan of Care (Signed)
  Problem: Metabolic: Goal: Ability to maintain appropriate glucose levels will improve Outcome: Progressing   Problem: Clinical Measurements: Goal: Respiratory complications will improve Outcome: Progressing   Problem: Activity: Goal: Ability to tolerate increased activity will improve Outcome: Progressing   Problem: Respiratory: Goal: Levels of oxygenation will improve Outcome: Progressing Goal: Ability to maintain adequate ventilation will improve Outcome: Progressing   Problem: Activity: Goal: Capacity to carry out activities will improve Outcome: Progressing

## 2023-10-27 NOTE — Consult Note (Signed)
Consultation Note Date: 10/27/2023   Patient Name: Alicia [name]  DOB: February 01, 1956  MRN: [id number]  Age / Sex: 67 y.o., female  PCP: [name], MD Referring Physician: Gust Rung, DO  Reason for Consultation: Establishing goals of care  HPI/Patient Profile: 67 y.o. female   admitted on 10/16/2023 with  PMH COPD, Chronic hypoxia (reportedly on 8L at home, last time she saw pulm was 4L), CHF, CVA, HLD, DM2, HTN, Obesity.     Admitted with  SOB and hypoxia, she  has been  diuresed, she has required an escalation of O2 from her baseline during this admission and at times BiPAP vs HHFNC.     On 11/30 she had a near-arrest, becoming bradycardic + associated worsening hypoxia & required atropine.    She has had multiple re-admission and reported continued physical and functional decline.  Patient and her family face treatment option decisions, advanced directive decisions and anticipatory care needs.    Clinical Assessment and Goals of Care:  This NP Lorinda Creed reviewed medical records, received report from team, assessed the patient and then meet at the patient's bedside  to discuss diagnosis, prognosis, GOC, EOL wishes disposition and options.   Concept of Palliative Care was introduced as specialized medical care for people and their families living with serious illness.  If focuses on providing relief from the symptoms and stress of a serious illness.  The goal is to improve quality of life for both the patient and the family.  Values and goals of care important to patient and family were attempted to be elicited.   A  discussion was had today regarding advanced directives.  Concepts specific to code status, artifical feeding and hydration, continued IV antibiotics and rehospitalization was had.    The difference between a aggressive medical intervention path  and a palliative comfort  care path for this patient at this time was had.    Education offered on hospice benefit; Philosophy and eligibility.   Patient cannot make decisions without her daughters input.  She does tells me that" if everything has been done I'm ready to go"  I spoke to daughter by telephone,  she understands the seriousness but feels that she is getting conflicting information.  She also reports that her mother waxes and wanes on her healthcare decisions.  Recommendation for  meeting with attending team, PMT,  patient and her daughter.    Daughter is in agreement    I made this recommendation known to attending team via secure chat and spoke to Dr Jolayne Panther by telephone.  Dr Susette Racer spoke to daughter at bedside, unfortunately I was not present. .  She tells me she detailed the medical situation and viable  treatment options to daughter and her mother.  Education offered to attending on hospice services; philosophy and eligibility.  A clear GOC plan is still not secured.  I returned to the room but daughter was not at the bedside and patient was back on BiPap, unable to participate in a conversation .  Again,  a GOC meeting needs to be had with both patient and her daughter and preferably attending team to eliminate any confusion and streamline communication.     I will be out of the hospital tomorrow but one of my colleague  will f/u in the morning   Questions and concerns addressed.     PMT will continue to support holistically.          No documented HPOA or ACP docuemtns     SUMMARY OF RECOMMENDATIONS    Code Status/Advance Care Planning: Limited code- previously documented   Palliative Prophylaxis:  Aspiration, Bowel Regimen, and Delirium Protocol  Additional Recommendations (Limitations, Scope, Preferences): Full Scope Treatment  Psycho-social/Spiritual:  Desire for further Chaplaincy support:yes Additional Recommendations: Education on Hospice  Prognosis:   Likely long term poor prognosis   Discharge Planning: To Be Determined      Primary Diagnoses: Present on Admission:  Acute on chronic heart failure with preserved ejection fraction (HFpEF) (HCC)  Type 2 diabetes mellitus with moderate nonproliferative diabetic retinopathy (HCC)  Essential hypertension  COPD (chronic obstructive pulmonary disease) (HCC)  Hyperlipidemia  Gastroesophageal reflux disease  Obesity (BMI 30.0-34.9)  Nocturnal hypoxemia  AKI (acute kidney injury) (HCC)  Dysuria  Acute on chronic respiratory failure with hypoxia (HCC)   I have reviewed the medical record, interviewed the patient and family, and examined the patient. The following aspects are pertinent.  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  Non-functioning   Aortic atherosclerosis (HCC) 09/24/2017   Asymptomatic, found on CT scan   Blood transfusion without reported diagnosis    Chronic Systolic Heart Failure 05/10/2014   Felt to be non-ischemic and secondary to hypertension.  Echo (05/28/2014): LVEF 25%.  Is not interested in AICD placement.   COPD exacerbation (HCC)    Coronary artery disease involving native coronary artery of native heart without angina pectoris 11/09/2014   Cardiac cath (02/18/2014): Non-obstructive, mRCA 30%, dRCA 60%   Cystocele with uterine prolapse - grade 3 02/12/2016   Not interested in pessary after trying   Diverticulosis of colon 09/24/2017   Diverticulosis of colon 09/24/2017   Essential hypertension 10/15/2013   Gastroesophageal reflux disease 09/24/2017   History of cerebrovascular accident 05/10/2013   Per patient report 6 previous strokes, most recent on 05/10/13.  No residual deficits.   History of cerebrovascular accident 05/10/2013   Per patient report 6 previous strokes, most recent on 05/10/13.   No residual deficits.   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 (HCC) 10/15/2013   Social History   Socioeconomic History   Marital status: Widowed    Spouse name: Not on file   Number of children: 3   Years of education: Not on file   Highest education level: High school graduate  Occupational History   Occupation: Disabled    Comment: Formerly worked in Dentist  Tobacco Use   Smoking status: Former    Current packs/day: 0.00  Average packs/day: 1 pack/day for 40.0 years (40.0 ttl pk-yrs)    Types: Cigarettes    Start date: 09/17/1979    Quit date: 09/17/2019    Years since quitting: 4.1   Smokeless tobacco: Never  Vaping Use   Vaping status: Never Used  Substance and Sexual Activity   Alcohol use: No    Alcohol/week: 0.0 standard drinks of alcohol   Drug use: No   Sexual activity: Not Currently    Birth control/protection: Post-menopausal  Other Topics Concern   Not on file  Social History Narrative   Current Social History 02/25/2021        Patient lives with family in a home which is 2 stories. There are steps up to the entrance the patient uses with handrails.       Patient's method of transportation is personal car.      The highest level of education was some high school.      The patient currently disabled.      Identified important Relationships are "Family, my daughter and grandkids"       Pets : My dog       Interests / Fun: "sleeping, working in my flowerbed        Current Stressors: The war in [place]       Social Determinants of Health   Financial Resource Strain: Low  Risk  (10/18/2023)   Overall Financial Resource Strain (CARDIA)    Difficulty of Paying Living Expenses: Not hard at all  Food Insecurity: No Food Insecurity (10/16/2023)   Hunger Vital Sign    Worried About Running Out of Food in the Last Year: Never true    Ran Out of Food in the Last Year: Never true  Transportation Needs: Unmet Transportation Needs (10/16/2023)   PRAPARE - Transportation    Lack of Transportation (Medical): Yes    Lack of Transportation (Non-Medical): Yes  Physical Activity: Inactive (05/19/2023)   Exercise Vital Sign    Days of Exercise per Week: 0 days    Minutes of Exercise per Session: 0 min  Stress: No Stress Concern Present (05/19/2023)   [employer] of Occupational Health - Occupational Stress Questionnaire    Feeling of Stress : Not at all  Social Connections: Socially Isolated (05/19/2023)   Social Connection and Isolation Panel [NHANES]    Frequency of Communication with Friends and Family: More than three times a week    Frequency of Social Gatherings with Friends and Family: Never    Attends Religious Services: Never    Database administrator or Organizations: No    Attends Banker Meetings: Never    Marital Status: Widowed   Family History  Problem Relation Age of Onset   Diabetes Mellitus II Mother    Hypertension Mother    Cerebrovascular Accident Mother 60       Massive, resulted in death   Pulmonary embolism Father 27       Resulted in sudden death   Heart failure Brother    Obesity Brother    Coronary artery disease Brother    Obesity Brother    COPD Brother    Diabetes Mellitus II Brother    Healthy Brother    Healthy Brother    Healthy Daughter    Healthy Son    Healthy Son    Scheduled Meds:  amLODipine  10 mg Oral Daily   apixaban  5 mg Oral BID   arformoterol  15 mcg Nebulization BID  atorvastatin  80 mg Oral Daily   budesonide (PULMICORT) nebulizer solution  0.5 mg Nebulization BID   calcium carbonate   1 tablet Oral Daily   ezetimibe  10 mg Oral Daily   insulin aspart  0-15 Units Subcutaneous TID WC   insulin aspart  0-5 Units Subcutaneous QHS   insulin glargine-yfgn  30 Units Subcutaneous QHS   pantoprazole  40 mg Oral Daily   revefenacin  175 mcg Nebulization Daily   sodium chloride flush  10 mL Intravenous Q12H   torsemide  60 mg Oral BID   Continuous Infusions: PRN Meds:.acetaminophen **OR** acetaminophen, guaiFENesin-dextromethorphan, hydrOXYzine, ondansetron (ZOFRAN) IV, mouth rinse, phenol, polyethylene glycol Medications Prior to Admission:  Prior to Admission medications   Medication Sig Start Date End Date Taking? Authorizing Provider  acetaminophen (TYLENOL) 500 MG tablet Take 500 mg by mouth daily as needed for fever, headache or moderate pain (pain score 4-6).   Yes [provider]  albuterol (PROVENTIL) (2.5 MG/3ML) 0.083% nebulizer solution INHALE 3 ML BY NEBULIZATION EVERY 6 HOURS AS NEEDED FOR WHEEZING OR SHORTNESS OF BREATH 07/30/23  Yes Charlott Holler, MD  albuterol (VENTOLIN HFA) 108 (90 Base) MCG/ACT inhaler Inhale 2 puffs into the lungs every 6 (six) hours as needed for wheezing or shortness of breath. 09/06/23  Yes Lovie Macadamia, MD  amLODipine (NORVASC) 10 MG tablet Take 10 mg by mouth daily.   Yes [provider]  apixaban (ELIQUIS) 5 MG TABS tablet Take 1 tablet (5 mg total) by mouth 2 (two) times daily. 08/19/23 08/18/24 Yes [name], MD  atorvastatin (LIPITOR) 80 MG tablet TAKE 1 TABLET BY MOUTH EVERY DAY 06/09/23  Yes Jake Bathe, MD  Cholecalciferol (VITAMIN D-3 PO) Take 1 capsule by mouth daily.   Yes [provider]  ezetimibe (ZETIA) 10 MG tablet TAKE 1 TABLET BY MOUTH EVERY DAY 07/28/23  Yes Swinyer, Zachary George, NP  Fluticasone-Umeclidin-Vilant (TRELEGY ELLIPTA) 200-62.5-25 MCG/ACT AEPB Inhale 1 puff into the lungs daily. 09/03/23  Yes Lovie Macadamia, MD  furosemide (LASIX) 40 MG tablet Take 80 mg by mouth  daily.   Yes [provider]  hydrochlorothiazide (HYDRODIURIL) 25 MG tablet Take 25 mg by mouth daily.   Yes [provider]  insulin glargine (LANTUS) 100 UNIT/ML Solostar Pen Inject 20 Units into the skin daily. Patient taking differently: Inject 30 Units into the skin at bedtime. 08/17/23  Yes Morrie Sheldon, MD  OXYGEN Inhale 8 L/min into the lungs continuous.   Yes [provider]  sacubitril-valsartan (ENTRESTO) 97-103 MG Take 1 tablet by mouth 2 (two) times daily.   Yes [provider]  torsemide (DEMADEX) 20 MG tablet Take 2 tablets (40 mg total) by mouth 2 (two) times daily. 10/07/23  Yes [name], MD  glucose blood (ACCU-CHEK AVIVA PLUS) test strip 1 each by Other route See admin instructions. USE TO TEST BLOOD SUGARS AS DIRECTED 08/19/23   [name], MD  insulin lispro (HUMALOG) 100 UNIT/ML KwikPen Inject 14 Units into the skin 3 (three) times daily before meals. Only take if eating a meal AND Blood Glucose (BG) is 80 or higher. Patient taking differently: Inject 14 Units into the skin See admin instructions. Inject 14 units 3 times daily before meals. Only take if eating a meal AND Blood Glucose (BG) is 80 or higher. 08/17/23   Morrie Sheldon, MD  Insulin Syringe-Needle U-100 31G X 15/64" 0.3 ML MISC Use to inject Humalog before meals three times a  day 05/31/19   [name], MD  Lancets (ACCU-CHEK MULTICLIX) lancets Use to check your blood sugar four times daily: early morning, before a meal, two hours after a meal, and bedtime 01/16/21   [name], MD  spironolactone (ALDACTONE) 25 MG tablet Take 1 tablet (25 mg total) by mouth daily. Patient not taking: Reported on 10/16/2023 08/17/23   Morrie Sheldon, MD   Allergies  Allergen Reactions   Invokana [Canagliflozin] Itching, Anxiety and Palpitations   Aldactone [Spironolactone] Other (See Comments)    Tremors    Review of Systems  Respiratory:  Positive  for shortness of breath.   Neurological:  Positive for weakness.    Physical Exam Cardiovascular:     Rate and Rhythm: Bradycardia present.  Skin:    General: Skin is warm and dry.  Neurological:     Mental Status: She is alert and oriented to person, place, and time.     Vital Signs: BP (!) 141/64 (BP Location: Right Arm)   Pulse (!) 56   Temp (!) 97.1 F (36.2 C) (Oral)   Resp 20   Ht 5\' 7"  (1.702 m)   Wt 97.3 kg   LMP  (LMP Unknown)   SpO2 93%   BMI 33.60 kg/m  Pain Scale: 0-10   Pain Score: 0-No pain   SpO2: SpO2: 93 % O2 Device:SpO2: 93 % O2 Flow Rate: .O2 Flow Rate (L/min): 50 L/min  IO: Intake/output summary:  Intake/Output Summary (Last 24 hours) at 10/27/2023 0957 Last data filed at 10/27/2023 0850 Gross per 24 hour  Intake 788.85 ml  Output 1185 ml  Net -396.15 ml    LBM: Last BM Date : 10/26/23 Baseline Weight: Weight: 98.2 kg Most recent weight: Weight: 97.3 kg     Palliative Assessment/Data:  40%     Time: 90 minutes  Signed by: Lorinda Creed, NP   Please contact Palliative Medicine Team phone at [phone] for questions and concerns.  For individual provider: See Loretha Stapler

## 2023-10-27 NOTE — Progress Notes (Signed)
HD#11 SUBJECTIVE:  Patient Summary: Alicia [name] is a 67 y.o. 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   Overnight Events: None   Interim History: Patient is evaluated today, she is sitting on chair, reports doing ok. Still endorses SOB.  Otherwise denies any abdominal pain, nausea, vomiting, chest pain.   OBJECTIVE:  Vital Signs: Vitals:   10/27/23 0418 10/27/23 0456 10/27/23 0800 10/27/23 1132  BP: 125/61  (!) 141/64 (!) 147/65  Pulse: (!) 52  (!) 56 (!) 57  Resp: 18  20 20   Temp: 98.3 F (36.8 C)  (!) 97.1 F (36.2 C) 98.6 F (37 C)  TempSrc: Axillary  Oral Oral  SpO2: 97%  93% (!) 87%  Weight:  97.3 kg    Height:       Supplemental O2: HFNC SpO2: (!) 87 % O2 Flow Rate (L/min): 50 L/min FiO2 (%): (S) 100 % (per sats)  Filed Weights   10/25/23 0500 10/26/23 0522 10/27/23 0456  Weight: 97.5 kg 99.3 kg 97.3 kg     Intake/Output Summary (Last 24 hours) at 10/27/2023 1537 Last data filed at 10/27/2023 1400 Gross per 24 hour  Intake 1189 ml  Output 585 ml  Net 604 ml   Net IO Since Admission: -6,193.59 mL [10/27/23 1537]  Physical Exam: Physical Exam  General: Ill-appearing, sitting on chair, no acute distress Head: Normocephalic, atraumatic, wearing HHFNC 50 L Cardiac: Regular rate, regular rhythm, no pitting edema bilateral lower extremities Pulmonary: + bibasilar course crackles  Abdomen: Soft, not tender, nondistended, bowel sounds present Neuro: Awake, alert, no focal deficit  Patient Lines/Drains/Airways Status     Active  Line/Drains/Airways     Name Placement date Placement time Site Days   Peripheral IV 10/26/23 22 G 1" Right;Anterior;Proximal Forearm 10/26/23  2043  Forearm  1   External Urinary Catheter 10/27/23  1400  --  less than 1   Wound / Incision (Open or Dehisced) 10/20/23 (IAD) Incontinence Associated Dermatitis Buttocks Right;Left 10/20/23  1044  Buttocks  7             ASSESSMENT/PLAN:  Assessment: Active Problems:   Type 2 diabetes mellitus with moderate nonproliferative diabetic retinopathy (HCC)   Essential hypertension   COPD (chronic obstructive pulmonary disease) (HCC)   Hyperlipidemia   Gastroesophageal reflux disease   Obesity (BMI 30.0-34.9)   Dysuria   Nocturnal hypoxemia   AKI (acute kidney injury) (HCC)   Acute on chronic respiratory failure with hypoxia (HCC)   Acute congestive heart failure (HCC)   Acute on chronic heart failure with preserved ejection fraction (HFpEF) (HCC)   Plan: Alicia [name] is a 67 y.o. 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   Plan: #Acute on chronic hypoxic/hypercarbic respiratory failure (  home O2 8L ) #Acute on Chronic HF Exacerbation (EF 50-55%) #Acute COPD exacerbation   Presented with worsening dyspnea and fatigue for 1 week with increasing oxygen requirement.  Initial chest x-ray showed mild pulmonary vascular congestion with small bilateral layering pleural effusion. BNP >2000; initial VBG showed pH 7.3, bicarb 39.2, pCO2 73.2. Lung exam notable for bilateral rales and bibasilar crackles. She also  had 1+ pitting edema below mid-shin bilaterally. Echo EF 45 to 50%, decreased from echo findings on 11/24.    Per evaluation today, she reports doing alright. Still endorses SOB. Spoke with family about GOC, reported that the patient is now switched to oral torsemide. Family understands that the patient can not go home with Alicia [name]; family wants to try other options.    Plan: - PLAN TO WEAN OXYGEN TO CLOSER TO 15 L in the next couple of days  - Transitioned to PO torsemide 60 mg BID  - Continue Yupelri, Budesonide and Atrovent, breathing treatments  - BiPAP at bedtime, NPO when on BiPAP - Daily weight checks with Strict Is & Os - PT/OT recommended Home health PT/OT  - Advanced heart failure team and PCCM following, appreciate their recs.   - GOAL Mg > 2.0 and Potassium > 4.0  - Palliative care consulted, appreciate their recs.    #AKI #Contraction Alkalosis  #Cardiorenal syndrome  Presented with Cr 1.13, elevated from 0.75 on 09/03/2023 likely pre-renal, in the setting of HF exacerbation. Cr continues to trend higher in the setting of high dose lasix; She also had concerns of straining while urinating in the ED. UA negative infection. Bladder scan on 11/24 showed 0 PVR. There is likely a component of cardiorenal syndrome that is playing in role in her AKI in the setting of worsening HF exacerbation.  - Continue to trent BMP    Chronic Conditions:  Type 2 Diabetes with nonproliferative diabetic retinopathy: Semglee 30 units with moderate SSI HLD: Continue lipitor 80 mg daily and zetia 10 mg daily Paroxysmal A fib: Currently NSR, resumed Eliquis 5 mg BID HTN: Resumed home medication amlodipine 10 mg daily.  Best Practice: Diet: Cardiac diet IVF: Fluids: None, Rate: None VTE: Eliquis BID Code: DNR AB: None  Family Contact: Daughter, at bedside. DISPO: Anticipated discharge  3-6 days  to Home pending New oxygen.  Signature: Jeral Pinch, D.O.  Internal Medicine Resident,  PGY-1 [facility]  Pager: [phone] 3:37 PM, 10/27/2023   Please contact the on call pager after 5 pm and on weekends at [phone].

## 2023-10-28 ENCOUNTER — Ambulatory Visit: Payer: 59 | Admitting: Internal Medicine

## 2023-10-28 DIAGNOSIS — Z515 Encounter for palliative care: Secondary | ICD-10-CM

## 2023-10-28 DIAGNOSIS — I272 Pulmonary hypertension, unspecified: Secondary | ICD-10-CM

## 2023-10-28 DIAGNOSIS — E113393 Type 2 diabetes mellitus with moderate nonproliferative diabetic retinopathy without macular edema, bilateral: Secondary | ICD-10-CM | POA: Diagnosis not present

## 2023-10-28 DIAGNOSIS — I10 Essential (primary) hypertension: Secondary | ICD-10-CM | POA: Diagnosis not present

## 2023-10-28 DIAGNOSIS — J9622 Acute and chronic respiratory failure with hypercapnia: Secondary | ICD-10-CM | POA: Diagnosis not present

## 2023-10-28 DIAGNOSIS — Z7189 Other specified counseling: Secondary | ICD-10-CM

## 2023-10-28 DIAGNOSIS — K219 Gastro-esophageal reflux disease without esophagitis: Secondary | ICD-10-CM | POA: Diagnosis not present

## 2023-10-28 DIAGNOSIS — J9621 Acute and chronic respiratory failure with hypoxia: Secondary | ICD-10-CM | POA: Diagnosis not present

## 2023-10-28 DIAGNOSIS — I5033 Acute on chronic diastolic (congestive) heart failure: Secondary | ICD-10-CM | POA: Diagnosis not present

## 2023-10-28 LAB — CBC
HCT: 31.3 % — ABNORMAL LOW (ref 36.0–46.0)
Hemoglobin: 9.9 g/dL — ABNORMAL LOW (ref 12.0–15.0)
MCH: 25.8 pg — ABNORMAL LOW (ref 26.0–34.0)
MCHC: 31.6 g/dL (ref 30.0–36.0)
MCV: 81.7 fL (ref 80.0–100.0)
Platelets: 182 10*3/uL (ref 150–400)
RBC: 3.83 MIL/uL — ABNORMAL LOW (ref 3.87–5.11)
RDW: 16.6 % — ABNORMAL HIGH (ref 11.5–15.5)
WBC: 9.9 10*3/uL (ref 4.0–10.5)
nRBC: 0 % (ref 0.0–0.2)

## 2023-10-28 LAB — BASIC METABOLIC PANEL
Anion gap: 16 — ABNORMAL HIGH (ref 5–15)
BUN: 54 mg/dL — ABNORMAL HIGH (ref 8–23)
CO2: 33 mmol/L — ABNORMAL HIGH (ref 22–32)
Calcium: 9.1 mg/dL (ref 8.9–10.3)
Chloride: 87 mmol/L — ABNORMAL LOW (ref 98–111)
Creatinine, Ser: 1.54 mg/dL — ABNORMAL HIGH (ref 0.44–1.00)
GFR, Estimated: 37 mL/min — ABNORMAL LOW (ref >60)
Glucose, Bld: 128 mg/dL — ABNORMAL HIGH (ref 70–99)
Potassium: 4.1 mmol/L (ref 3.5–5.1)
Sodium: 136 mmol/L (ref 135–145)

## 2023-10-28 LAB — GLUCOSE, CAPILLARY
Glucose-Capillary: 106 mg/dL — ABNORMAL HIGH (ref 70–99)
Glucose-Capillary: 190 mg/dL — ABNORMAL HIGH (ref 70–99)
Glucose-Capillary: 309 mg/dL — ABNORMAL HIGH (ref 70–99)
Glucose-Capillary: 298 mg/dL — ABNORMAL HIGH (ref 70–99)
Glucose-Capillary: 182 mg/dL — ABNORMAL HIGH (ref 70–99)

## 2023-10-28 LAB — MAGNESIUM: Magnesium: 2 mg/dL (ref 1.7–2.4)

## 2023-10-28 MED ORDER — METOLAZONE 5 MG PO TABS
5.0000 mg | ORAL_TABLET | Freq: Once | ORAL | Status: AC
  Administered 2023-10-28: 5 mg via ORAL
  Filled 2023-10-28 (×2): qty 1

## 2023-10-28 MED ORDER — LORAZEPAM 2 MG/ML IJ SOLN
0.5000 mg | Freq: Once | INTRAMUSCULAR | Status: AC
  Administered 2023-10-28: 0.5 mg via INTRAVENOUS
  Filled 2023-10-28: qty 1

## 2023-10-28 NOTE — Progress Notes (Signed)
OT Cancellation Note  Patient Details Name: Alicia [name] [name] MRN: [id number] DOB: 01/31/1956   Cancelled Treatment:    Reason Eval/Treat Not Completed: Medical issues which prohibited therapy (Per RN, pt not medically appropriate for OT treatment session at this time as pt remains hypoxic and continues to have desat events. OT to reattempt to see pt at a later time as appropriate/available.)  Arliss Frisina "[name]" M., OTR/L, MA Acute Rehab [phone]  Lendon Colonel 10/28/2023, 4:19 PM

## 2023-10-28 NOTE — TOC Progression Note (Signed)
Transition of Care [facility]) - Progression Note    Patient Details  Name: Alicia [name] MRN: [id number] Date of Birth: [birth year]  Transition [facility] CM/SW Contact  Michaela Corner, [place] Phone Number: 10/28/2023, 1:12 PM  Clinical Narrative:   CSW met pt at bedside to discuss SDOH needs. Pt declined needing resources at this time.    Expected Discharge Plan: Home w Home Health Services Barriers to Discharge: Continued Medical Work up  Expected Discharge Plan and Services In-house Referral: NA Discharge Planning Services: CM Consult Post Acute Care Choice: Resumption of Svcs/PTA Provider, Home Health Living arrangements for the past 2 months: Single Family Home                 DME Arranged: N/A DME Agency: NA       HH Arranged: PT, OT HH Agency: [facility] Date [facility] Agency Contacted: 10/18/23 Time HH Agency Contacted: 1759 Representative spoke with at [facility] Agency: Kandee Keen   Social Determinants of Health (SDOH) Interventions SDOH Screenings   Food Insecurity: No Food Insecurity (10/16/2023)  Housing: Patient Unable To Answer (10/16/2023)  Transportation Needs: Unmet Transportation Needs (10/16/2023)  Utilities: Not At Risk (10/16/2023)  Alcohol Screen: Low Risk  (10/18/2023)  Depression (PHQ2-9): Low Risk  (09/06/2023)  Financial Resource Strain: Low Risk  (10/18/2023)  Physical Activity: Inactive (05/19/2023)  Social Connections: Socially Isolated (05/19/2023)  Stress: No Stress Concern Present (05/19/2023)  Tobacco Use: Medium Risk (10/16/2023)    Readmission Risk Interventions    08/10/2023   12:29 PM 07/28/2023   11:44 AM  Readmission Risk Prevention Plan  Transportation Screening Complete Complete  Medication Review Oceanographer) Complete Complete  PCP or Specialist appointment within 3-5 days of discharge Complete   HRI or Home Care Consult Complete Complete  SW Recovery Care/Counseling Consult  Complete  Palliative Care Screening Not  Applicable Not Applicable  Skilled Nursing Facility Not Applicable Not Applicable

## 2023-10-28 NOTE — Progress Notes (Addendum)
HD#12 SUBJECTIVE:  Patient Summary: Alicia [name] is a 67 y.o. 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   Overnight Events: None   Interim History: Pt evaluated at bedside, she reported no concerns today. Denied any nausea, vomiting, abd pain. Patient was told that we will plan to wean her to 15 L oxygen ultimately prior to her going home. She understands.   14:30: Patient desatted to 40s while she was being transferred from the chair to the bed. Per RN and RT, she was initially HFNC 15L transitioned to BiPAP prior to getting to bed. During this time, roughly 2 mins, she desatted to 40s because the BiPAP was not hooked directly to the oxygen. Nurses at bedside, patient was hooked back on oxygen, on BiPAP saturating above 94%, alert and oriented, VSS.   OBJECTIVE:  Vital Signs: Vitals:   10/28/23 0203 10/28/23 0257 10/28/23 0409 10/28/23 0629  BP:   (!) 152/67   Pulse: (!) 54  60   Resp:  (!) 25 20   Temp:   98.3 F (36.8 C)   TempSrc:   Axillary   SpO2: 93%  91% 95%  Weight:   95 kg   Height:       Supplemental O2:  BiPAP SpO2: 95 % O2 Flow Rate (L/min): 60 L/min FiO2 (%): 80 %  Filed Weights   10/26/23 0522 10/27/23 0456 10/28/23 0409  Weight: 99.3 kg 97.3 kg 95 kg     Intake/Output Summary (Last 24 hours) at 10/28/2023 0701 Last data filed at 10/28/2023 0420 Gross per 24 hour  Intake 920 ml  Output 1150 ml  Net -230 ml   Net IO Since Admission: -7,163.59 mL [10/28/23 0701]  Physical Exam: Physical Exam  General: Ill-appearing, laying in  bed.  Head: Normocephalic, atraumatic, wearing HHFNC  Neck: No JVD appreciated.  Cardiac: Regular rate, regular rhythm, trace pitting edema b/l LE Pulmonary: + bibasilar course crackles  Abdomen: Soft, not tender, nondistended, bowel sounds present Neuro: Awake, alert, no focal deficit  Patient Lines/Drains/Airways Status     Active Line/Drains/Airways     Name Placement date Placement time Site Days   Peripheral IV 10/26/23 22 G 1" Right;Anterior;Proximal Forearm 10/26/23  2043  Forearm  2   External Urinary Catheter 10/27/23  1400  --  1   Wound / Incision (Open or Dehisced) 10/20/23 (IAD) Incontinence Associated Dermatitis Buttocks Right;Left 10/20/23  1044  Buttocks  8             ASSESSMENT/PLAN:  Assessment: Active Problems:   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   Plan: #Acute on chronic hypoxic/hypercarbic respiratory failure (home O2 8L [place]) #Acute on Chronic HF Exacerbation (EF 50-55%) #Acute COPD exacerbation   Presented  with worsening dyspnea and fatigue for 1 week with increasing oxygen requirement.  Initial chest x-ray showed mild pulmonary vascular congestion with small bilateral layering pleural effusion. BNP >2000; initial VBG showed pH 7.3, bicarb 39.2, pCO2 73.2. Lung exam notable for bilateral rales and bibasilar crackles. She also had 1+ pitting edema below mid-shin bilaterally. Echo EF 45 to 50%, decreased from echo findings on 11/24.     Spoke with daughter over the phone, she agrees with weaning the patient to 15L oxygen in the next couple of days.    Plan: - PLAN TO WEAN OXYGEN TO CLOSER TO 15 L in the next couple of days  - Continue PO torsemide 60 mg BID  - One dose of metolazone 5 mg today  - Continue Yupelri, Budesonide and Atrovent, breathing treatments  - BiPAP at bedtime, NPO when on BiPAP - Daily weight checks with Strict Is & Os - PT/OT recommended Home health PT/OT  - Advanced heart failure team and PCCM following, appreciate their recs.   - GOAL Mg > 2.0 and Potassium > 4.0  - Palliative care consulted, appreciate their recs.    #AKI #Contraction Alkalosis  #Cardiorenal syndrome  Presented with Cr 1.13, elevated from 0.75 on 09/03/2023 likely pre-renal, in the setting of HF exacerbation. Cr continues to trend higher in the setting of high dose lasix; She also had concerns of straining while urinating in the ED. UA negative infection. Bladder scan on 11/24 showed 0 PVR. There is likely a component of cardiorenal syndrome that is playing in role in her AKI in the setting of worsening HF exacerbation.  - Continue to trent BMP    Chronic Conditions:  Type 2 Diabetes with nonproliferative diabetic retinopathy: Semglee 30 units with moderate SSI HLD: Continue lipitor 80 mg daily and zetia 10 mg daily Paroxysmal A fib: Currently NSR, continue Eliquis 5 mg BID HTN: Resumed home medication amlodipine 10 mg daily. Obesity/OHS/OSA: Wears CPAP at night, currently on BiPAP at night   Best  Practice: Diet: Cardiac diet IVF: Fluids: None, Rate: None VTE: Eliquis BID Code: DNR AB: None Family Contact: daughter, at bedside. DISPO: Anticipated discharge  2-3  to Home pending New oxygen.  Signature: Jeral Pinch, D.O.  Internal Medicine Resident, PGY-1 [facility]  Pager: [phone] 7:01 AM, 10/28/2023   Please contact the on call pager after 5 pm and on weekends at [phone].

## 2023-10-28 NOTE — Plan of Care (Signed)
  Problem: Education: Goal: Ability to describe self-care measures that may prevent or decrease complications (Diabetes Survival Skills Education) will improve Outcome: Progressing Goal: Individualized Educational Video(s) Outcome: Progressing   Problem: Coping: Goal: Ability to adjust to condition or change in health will improve Outcome: Progressing   Problem: Fluid Volume: Goal: Ability to maintain a balanced intake and output will improve Outcome: Progressing   Problem: Health Behavior/Discharge Planning: Goal: Ability to identify and utilize available resources and services will improve Outcome: Progressing Goal: Ability to manage health-related needs will improve Outcome: Progressing   Problem: Metabolic: Goal: Ability to maintain appropriate glucose levels will improve Outcome: Progressing   Problem: Nutritional: Goal: Maintenance of adequate nutrition will improve Outcome: Progressing Goal: Progress toward achieving an optimal weight will improve Outcome: Progressing   Problem: Skin Integrity: Goal: Risk for impaired skin integrity will decrease Outcome: Progressing   Problem: Tissue Perfusion: Goal: Adequacy of tissue perfusion will improve Outcome: Progressing   Problem: Education: Goal: Knowledge of General Education information will improve Description: Including pain rating scale, medication(s)/side effects and non-pharmacologic comfort measures Outcome: Progressing   Problem: Health Behavior/Discharge Planning: Goal: Ability to manage health-related needs will improve Outcome: Progressing   Problem: Clinical Measurements: Goal: Ability to maintain clinical measurements within normal limits will improve Outcome: Progressing Goal: Will remain free from infection Outcome: Progressing Goal: Diagnostic test results will improve Outcome: Progressing Goal: Respiratory complications will improve Outcome: Progressing Goal: Cardiovascular complication will  be avoided Outcome: Progressing   Problem: Activity: Goal: Risk for activity intolerance will decrease Outcome: Progressing   Problem: Nutrition: Goal: Adequate nutrition will be maintained Outcome: Progressing   Problem: Coping: Goal: Level of anxiety will decrease Outcome: Progressing   Problem: Elimination: Goal: Will not experience complications related to bowel motility Outcome: Progressing Goal: Will not experience complications related to urinary retention Outcome: Progressing   Problem: Pain Management: Goal: General experience of comfort will improve Outcome: Progressing   Problem: Safety: Goal: Ability to remain free from injury will improve Outcome: Progressing   Problem: Skin Integrity: Goal: Risk for impaired skin integrity will decrease Outcome: Progressing   Problem: Education: Goal: Knowledge of disease or condition will improve Outcome: Progressing Goal: Knowledge of the prescribed therapeutic regimen will improve Outcome: Progressing Goal: Individualized Educational Video(s) Outcome: Progressing   Problem: Activity: Goal: Ability to tolerate increased activity will improve Outcome: Progressing Goal: Will verbalize the importance of balancing activity with adequate rest periods Outcome: Progressing   Problem: Respiratory: Goal: Ability to maintain a clear airway will improve Outcome: Progressing Goal: Levels of oxygenation will improve Outcome: Progressing Goal: Ability to maintain adequate ventilation will improve Outcome: Progressing   Problem: Education: Goal: Ability to demonstrate management of disease process will improve Outcome: Progressing Goal: Ability to verbalize understanding of medication therapies will improve Outcome: Progressing Goal: Individualized Educational Video(s) Outcome: Progressing   Problem: Activity: Goal: Capacity to carry out activities will improve Outcome: Progressing   Problem: Cardiac: Goal: Ability  to achieve and maintain adequate cardiopulmonary perfusion will improve Outcome: Progressing

## 2023-10-28 NOTE — Progress Notes (Signed)
Pt taken off BIPAP per pt's request. Pt placed back on HHFNC 60L/100%.  Pt tolerating well at this time.

## 2023-10-28 NOTE — TOC Progression Note (Signed)
Transition of Care [facility] - Progression Note    Patient Details  Name: Alicia [name] MRN: [id number] Date of Birth: 1956/04/12  Transition of Care [facility]) CM/SW Contact  Nicanor Bake Phone Number: [phone] 10/28/2023, 3:06 PM  Clinical Narrative:   HF CSW attempted to meet with pt at bedside. Pt was being seen by nurses in the room at the time. CSW will follow up with pt at a more appropriate time.   TOC will continue following.     Expected Discharge Plan: Home w Home Health Services Barriers to Discharge: Continued Medical Work up  Expected Discharge Plan and Services In-house Referral: NA Discharge Planning Services: CM Consult Post Acute Care Choice: Resumption of Svcs/PTA Provider, Home Health Living arrangements for the past 2 months: Single Family Home                 DME Arranged: N/A DME Agency: NA       HH Arranged: PT, OT HH Agency: [facility] Date [facility] Agency Contacted: 10/18/23 Time HH Agency Contacted: [id number] Representative spoke with at [facility] Agency: Kandee Keen   Social Determinants of Health (SDOH) Interventions SDOH Screenings   Food Insecurity: No Food Insecurity (10/16/2023)  Housing: Patient Unable To Answer (10/16/2023)  Transportation Needs: Unmet Transportation Needs (10/16/2023)  Utilities: Not At Risk (10/16/2023)  Alcohol Screen: Low Risk  (10/18/2023)  Depression (PHQ2-9): Low Risk  (09/06/2023)  Financial Resource Strain: Low Risk  (10/18/2023)  Physical Activity: Inactive (05/19/2023)  Social Connections: Socially Isolated (05/19/2023)  Stress: No Stress Concern Present (05/19/2023)  Tobacco Use: Medium Risk (10/16/2023)    Readmission Risk Interventions    08/10/2023   12:29 PM 07/28/2023   11:44 AM  Readmission Risk Prevention Plan  Transportation Screening Complete Complete  Medication Review Oceanographer) Complete Complete  PCP or Specialist appointment within 3-5 days of discharge Complete   HRI or Home Care  Consult Complete Complete  SW Recovery Care/Counseling Consult  Complete  Palliative Care Screening Not Applicable Not Applicable  Skilled Nursing Facility Not Applicable Not Applicable

## 2023-10-28 NOTE — Progress Notes (Signed)
NAME:  Alicia [name], MRN:  [id number], DOB:  [birth year], LOS: 12 ADMISSION DATE:  10/16/2023, CONSULTATION DATE:  10/24/23 REFERRING MD:  Merrilee Jansky - IMTS, CHIEF COMPLAINT:  hypoxia    History of Present Illness:  67 yo f PMH COPD, Chronic hypoxia (reportedly on 8L at home, last time she saw pulm was 4L), HF recovered EF, CVA, HLD, DM2, HTN, Obesity who was admitted to IMTS 11/23 for SOB and AoC hypoxia, and has been progressively diuresed 11/23-12/1 for a HF exacerbation with initial cxr c/w volume overload and BNP >2000. She has required an escalation of O2 from her baseline during this admission and at times BiPAP vs HHFNC.  On 11/30 she had a near-arrest, becoming bradycardic + associated worsening hypoxia & required atropine.   Despite escalating diuresis, pt remains hypoxic. On 12/1 PCCM is consulted in this setting.  Pertinent  Medical History  COPD Chronic hypoxia HFpEF  Significant Hospital Events: Including procedures, antibiotic start and stop dates in addition to other pertinent events   11/23  admit to IMTS on BiPAP,40 mg lasix in ED then  80 lasix after admission.  11/24 80 lasix BID -- 900cc UOP 11/23-24 11/25 given 1x solumedrol and started on pred for possible AECOPD. Lasix was stopped for AKI (1.44 from 1.3 from 1.1) 11/26 lasix restarted 80 BID 11/27 lasix increased to 120 TID. Pred stopped.  Cr 1.45 11/28 diamox added, lasix de-escalated to 120 BID. Improved renal fxn cr 1.08   11/29 pt apparently brought up end stage dz process questions. Primary plans to organize family meeting  11/30 a code blue was calle d, she did not lose pulses but did require atropine for symptomatic bradycardia. Family meeting later that day, remains full code full scope of offered care.  12/1 PCCM consulted given lack of clinical improvement & ongoing desat events.   Interim History / Subjective:  +185 cc   Objective   Blood pressure 124/81, pulse (!) 58, temperature 99 F (37.2 C),  temperature source Oral, resp. rate (!) 26, height 5\' 7"  (1.702 m), weight 95 kg, SpO2 (!) 89%.    Vent Mode: PCV;BIPAP FiO2 (%):  [80 %-100 %] 90 % Set Rate:  [15 bmp] 15 bmp PEEP:  [10 cmH20-12 cmH20] 10 cmH20   Intake/Output Summary (Last 24 hours) at 10/28/2023 0929 Last data filed at 10/28/2023 0420 Gross per 24 hour  Intake 620 ml  Output 1150 ml  Net -530 ml   Filed Weights   10/26/23 0522 10/27/23 0456 10/28/23 0409  Weight: 99.3 kg 97.3 kg 95 kg    Examination: Morbid obese female sitting on side of the bed awake and alert no acute distress No obvious JVD Breath sounds throughout currently on 90% FiO2 with 40% liter per minute flow of oxygen heart sounds are distant But is obese soft nontender Extremities are with 2-3+ edema   Recent Labs  Lab 10/26/23 0245 10/27/23 0231 10/28/23 0225  NA 135 137 136  K 3.6 3.5 4.1  CL 88* 89* 87*  CO2 37* 39* 33*  BUN 49* 49* 54*  CREATININE 1.34* 1.42* 1.54*  GLUCOSE 174* 106* 128*   Recent Labs  Lab 10/24/23 0244 10/26/23 0801 10/28/23 0225  HGB 9.9* 10.5* 9.9*  HCT 33.9* 34.6* 31.3*  WBC 7.9 8.5 9.9  PLT 218 180 182     Resolved Hospital Problem list     Assessment & Plan:   Acute on chronic hypoxic and hypercarbic respiratory failure-baseline 8 L oxygen AoC  HFrEF COPD Bilateral pleural effusions vs atelectasis AKI Lab Results  Component Value Date   CREATININE 1.54 (H) 10/28/2023   CREATININE 1.42 (H) 10/27/2023   CREATININE 1.34 (H) 10/26/2023   CREATININE 0.63 08/28/2015   CREATININE 0.44 (L) 02/27/2014   CREATININE 0.46 (L) 11/01/2013    Discussion She has severe hypoxia in spite of 6 L negative balance, no evidence of pneumonia.  BNP remains slight high PFTs 06/2018 has shown moderate to severe restriction with FEV1 45% and FVC 43%.  She quit smoking in 2020.  Have to rule out right-to-left shunt Previous echo showed severe pulm hypertension, bubble study indeterminate  Plan BiPAP  nocturnally and as needed Continue to attempt to wean FiO2 currently down to 45 L at 90% FiO2 DNR/DNI Diuresis as tolerated Weight loss         Would continue palliative conversations, we do not have other therapeutic options  Best Practice (right click and "Reselect all SmartList Selections" daily)   Per primary team  Labs   CBC: Recent Labs  Lab 10/24/23 0244 10/26/23 0801 10/28/23 0225  WBC 7.9 8.5 9.9  HGB 9.9* 10.5* 9.9*  HCT 33.9* 34.6* 31.3*  MCV 87.4 84.2 81.7  PLT 218 180 182    Basic Metabolic Panel: Recent Labs  Lab 10/23/23 0213 10/24/23 0244 10/25/23 1008 10/26/23 0245 10/27/23 0231 10/28/23 0225  NA 137 139 136 135 137 136  K 3.9 4.1 4.1 3.6 3.5 4.1  CL 95* 95* 90* 88* 89* 87*  CO2 35* 35* 36* 37* 39* 33*  GLUCOSE 181* 85 210* 174* 106* 128*  BUN 53* 52* 48* 49* 49* 54*  CREATININE 1.17* 1.25* 1.30* 1.34* 1.42* 1.54*  CALCIUM 8.6* 9.1 9.1 8.9 8.9 9.1  MG 2.0  --  2.0 1.8 2.3 2.0   GFR: Estimated Creatinine Clearance: 43.1 mL/min (A) (by C-G formula based on SCr of 1.54 mg/dL (H)). Recent Labs  Lab 10/24/23 0244 10/24/23 1556 10/26/23 0801 10/28/23 0225  PROCALCITON  --  <0.10  --   --   WBC 7.9  --  8.5 9.9    Liver Function Tests: Recent Labs  Lab 10/24/23 1959  AST 14*  ALT 13  ALKPHOS 63  BILITOT 0.5  PROT 5.8*  ALBUMIN 2.7*   No results for input(s): "LIPASE", "AMYLASE" in the last 168 hours. No results for input(s): "AMMONIA" in the last 168 hours.  ABG    Component Value Date/Time   PHART 7.39 10/23/2023 1939   PCO2ART 68 (HH) 10/23/2023 1939   PO2ART 68 (L) 10/23/2023 1939   HCO3 41.8 (H) 10/23/2023 1939   TCO2 41 (H) 10/16/2023 1428   O2SAT 95.3 10/23/2023 1939     Coagulation Profile: No results for input(s): "INR", "PROTIME" in the last 168 hours.  Cardiac Enzymes: No results for input(s): "CKTOTAL", "CKMB", "CKMBINDEX", "TROPONINI" in the last 168 hours.  HbA1C: Hemoglobin A1C  Date/Time Value Ref  Range Status  09/03/2023 09:19 AM 8.1 (A) 4.0 - 5.6 % Final  05/03/2023 08:50 AM 8.2 (A) 4.0 - 5.6 % Final   Hgb A1c MFr Bld  Date/Time Value Ref Range Status  02/09/2022 11:00 AM 8.1 (H) 4.8 - 5.6 % Final    Comment:             Prediabetes: 5.7 - 6.4          Diabetes: >6.4          Glycemic control for adults with diabetes: <7.0   08/03/2017 07:19  AM 11.0 (H) 4.8 - 5.6 % Final    Comment:    (NOTE) Pre diabetes:          5.7%-6.4% Diabetes:              >6.4% Glycemic control for   <7.0% adults with diabetes     CBG: Recent Labs  Lab 10/27/23 0551 10/27/23 1130 10/27/23 1702 10/27/23 2055 10/28/23 0606  GLUCAP 77 125* 158* 163* 106*    Steve Ayce Pietrzyk ACNP Acute Care Nurse Practitioner [facility] Please consult Amion 10/28/2023, 9:29 AM

## 2023-10-28 NOTE — Progress Notes (Signed)
Daily Progress Note   Patient Name: Alicia [name]       Date: 10/28/2023 DOB: 03-09-1956  Age: 67 y.o. MRN#: [id number] Attending Physician: Gust Rung, DO Primary Care Physician: [name], MD Admit Date: 10/16/2023  Reason for Consultation/Follow-up: Establishing goals of care  Subjective: AM: Medical records reviewed including progress notes, labs and imaging. Patient assessed at the bedside. She is on BiPAP, still dyspneic. Discussed with RN and primary team during their morning rounds. Offered to call her daughter for ongoing support and return later for continued GOC discussions. I then attempted to reach Feliscia but was unable to get in touch and unable to leave a voicemail as her inbox was full.   PM: Returned to the bedside. Patient was weaned to [facility] and having an easier time holding conversation. She reports feeling much better as well. Patient confirms her interest in hospice upon discharge and her understanding of the current care plan with ongoing efforts to wean her oxygen requirements. Answered her question regarding the nearest hospice facility if she is unable to meet her oxygen requirements with home hospice. She is very appreciative and feels that "everything is going to be ok." I then received a return call from patient's daughter, who was concerned that patient told her she is discharging today. Provided clarification on the current care plan and ongoing titration of oxygen. Feliscia is in support of efforts to help patient get home with hospice. She asked me to make sure that we were very clear with patient about realistic timeline, whether she is progressing enough to go home vs needing residential, etc. She would be in support of residential hospice if necessary.  We discuss that it may be a few more days before determining a clear picture of discharge expectations.   Questions and concerns addressed. PMT will continue to support holistically.   Length of Stay: 12   Physical Exam Nursing note reviewed.  Constitutional:      General: She is not in acute distress.    Appearance: She is ill-appearing.  Cardiovascular:     Rate and Rhythm: Normal rate.  Pulmonary:     Effort: Tachypnea present.     Comments: BiPAP in place Neurological:     Mental Status: She is alert.            Vital Signs: BP 124/81 (BP Location: Left Arm)   Pulse (!) 58   Temp 99 F (37.2 C) (Oral)   Resp (!) 26   Ht 5\' 7"  (1.702 m)   Wt 95 kg   LMP  (LMP Unknown)   SpO2 (!) 89%   BMI 32.80 kg/m  SpO2: SpO2: (!) 89 % O2 Device: O2 Device: Heated High Flow Nasal Cannula O2 Flow Rate: O2 Flow Rate (L/min): (S) 45 L/min      Palliative Care Assessment & Plan   Patient Profile: 67 y.o. female admitted on 10/16/2023 with PMH COPD, Chronic hypoxia (reportedly on 8L at home, last time she saw pulm was 4L), CHF, CVA, HLD, DM2, HTN, Obesity.        On 11/30 she had a near-arrest, becoming bradycardic + associated worsening hypoxia & required atropine. Admitted with SOB  and hypoxia, she has been diuresed, she has required an escalation of O2 from her baseline during this admission and at times BiPAP vs HHFNC.     She has had multiple re-admission and reported continued physical and functional decline. Patient and her family face treatment option decisions, advanced directive decisions and anticipatory care needs.  Assessment: Goals of care conversation Acute on chronic hypoxic and hypercarbic respiratory failure Acute on chronic HFrEF AKI Cardiorenal syndrome  Acute COPD exacerbation  Recommendations/Plan: Continue DNR/DNI Continue current care Goal is to wean oxygen down enough to return home with hospice if possible. If not, patient/family would consider  residential hospice pending eligibility Psychosocial and emotional support provided PMT will continue to follow and support   Prognosis:  < 6 months  Discharge Planning: Home with Hospice vs residential hospice   Care plan was discussed with patient, patient's daughter, RN, IMTS    MDM high         Nirvan Laban Jeni Salles, PA-C  Palliative Medicine Team Team phone # [phone]  Thank you for allowing the Palliative Medicine Team to assist in the care of this patient. Please utilize secure chat with additional questions, if there is no response within 30 minutes please call the above phone number.  Palliative Medicine Team providers are available by phone from 7am to 7pm daily and can be reached through the team cell phone.  Should this patient require assistance outside of these hours, please call the patient's attending physician.

## 2023-10-29 DIAGNOSIS — J9621 Acute and chronic respiratory failure with hypoxia: Secondary | ICD-10-CM | POA: Diagnosis not present

## 2023-10-29 DIAGNOSIS — I5033 Acute on chronic diastolic (congestive) heart failure: Secondary | ICD-10-CM | POA: Diagnosis not present

## 2023-10-29 DIAGNOSIS — I2729 Other secondary pulmonary hypertension: Secondary | ICD-10-CM

## 2023-10-29 DIAGNOSIS — I509 Heart failure, unspecified: Secondary | ICD-10-CM | POA: Diagnosis not present

## 2023-10-29 DIAGNOSIS — Z7189 Other specified counseling: Secondary | ICD-10-CM | POA: Diagnosis not present

## 2023-10-29 DIAGNOSIS — Z515 Encounter for palliative care: Secondary | ICD-10-CM | POA: Diagnosis not present

## 2023-10-29 LAB — CBC
HCT: 30.2 % — ABNORMAL LOW (ref 36.0–46.0)
Hemoglobin: 9.3 g/dL — ABNORMAL LOW (ref 12.0–15.0)
MCH: 25.3 pg — ABNORMAL LOW (ref 26.0–34.0)
MCHC: 30.8 g/dL (ref 30.0–36.0)
MCV: 82.3 fL (ref 80.0–100.0)
Platelets: 185 10*3/uL (ref 150–400)
RBC: 3.67 MIL/uL — ABNORMAL LOW (ref 3.87–5.11)
RDW: 16.6 % — ABNORMAL HIGH (ref 11.5–15.5)
WBC: 11 10*3/uL — ABNORMAL HIGH (ref 4.0–10.5)
nRBC: 0 % (ref 0.0–0.2)

## 2023-10-29 LAB — GLUCOSE, CAPILLARY
Glucose-Capillary: 106 mg/dL — ABNORMAL HIGH (ref 70–99)
Glucose-Capillary: 190 mg/dL — ABNORMAL HIGH (ref 70–99)
Glucose-Capillary: 160 mg/dL — ABNORMAL HIGH (ref 70–99)
Glucose-Capillary: 135 mg/dL — ABNORMAL HIGH (ref 70–99)

## 2023-10-29 LAB — BASIC METABOLIC PANEL
Anion gap: 13 (ref 5–15)
BUN: 53 mg/dL — ABNORMAL HIGH (ref 8–23)
CO2: 36 mmol/L — ABNORMAL HIGH (ref 22–32)
Calcium: 9 mg/dL (ref 8.9–10.3)
Chloride: 86 mmol/L — ABNORMAL LOW (ref 98–111)
Creatinine, Ser: 1.52 mg/dL — ABNORMAL HIGH (ref 0.44–1.00)
GFR, Estimated: 38 mL/min — ABNORMAL LOW (ref >60)
Glucose, Bld: 122 mg/dL — ABNORMAL HIGH (ref 70–99)
Potassium: 3.8 mmol/L (ref 3.5–5.1)
Sodium: 135 mmol/L (ref 135–145)

## 2023-10-29 LAB — MAGNESIUM: Magnesium: 1.9 mg/dL (ref 1.7–2.4)

## 2023-10-29 MED ORDER — MAGNESIUM SULFATE 2 GM/50ML IV SOLN
2.0000 g | Freq: Once | INTRAVENOUS | Status: AC
  Administered 2023-10-29: 2 g via INTRAVENOUS
  Filled 2023-10-29: qty 50

## 2023-10-29 MED ORDER — METOLAZONE 5 MG PO TABS
5.0000 mg | ORAL_TABLET | Freq: Once | ORAL | Status: AC
  Administered 2023-10-29: 5 mg via ORAL
  Filled 2023-10-29: qty 1

## 2023-10-29 MED ORDER — POTASSIUM CHLORIDE CRYS ER 20 MEQ PO TBCR
20.0000 meq | EXTENDED_RELEASE_TABLET | Freq: Once | ORAL | Status: AC
  Administered 2023-10-29: 20 meq via ORAL
  Filled 2023-10-29: qty 1

## 2023-10-29 NOTE — Progress Notes (Signed)
Daily Progress Note   Patient Name: Alicia [name]       Date: 10/29/2023 DOB: [birth year]  Age: 67 y.o. MRN#: [id number] Attending Physician: Gust Rung, DO Primary Care Physician: [name], MD Admit Date: 10/16/2023  Reason for Consultation/Follow-up: Establishing goals of care  Subjective: Medical records reviewed including progress notes, labs and imaging. Patient assessed at the bedside.  She is tachypneic, able to briefly participate in conversation but quickly tires.  Her daughter is present visiting.  Created space and opportunity for patient and family's thoughts and feelings on her current illness.  Provided daughter with update on ongoing trials of weaning oxygen, sharing my concern that unfortunately this is not going as well as we would hope.  I fear she may not be able to go home and possibly not even to residential hospice if she cannot tolerate.  Patient ultimately understands and states "I will be okay."  Daughter is expressing her gratitude, sharing that she was very concerned that she would not be able to provide enough support in the home setting and patient may suffer while she is gone.  She remains open to [facility] if this is possible.  We discussed what an inpatient comfort focused care plan would look like if unable to tolerate transfer.  Patient and family are appreciative.  They plan for additional visitors to come spend time with her while continuing with the current care plan.  Questions and concerns addressed. PMT will continue to support holistically.   Length of Stay: 13   Physical Exam Nursing note reviewed.  Constitutional:      General: She is not in acute distress.    Appearance: She is ill-appearing.  Cardiovascular:     Rate and Rhythm:  Normal rate.  Pulmonary:     Effort: Tachypnea present.     Comments: HHFNC Neurological:     Mental Status: She is alert.            Vital Signs: BP (!) 141/63 (BP Location: Left Arm)   Pulse 66   Temp 99.5 F (37.5 C) (Oral)   Resp 20   Ht 5\' 7"  (1.702 m)   Wt 95 kg   LMP  (LMP Unknown)   SpO2 93%   BMI 32.80 kg/m  SpO2: SpO2: 93 % O2 Device: O2 Device: Heated High Flow Nasal Cannula O2 Flow Rate: O2 Flow Rate (L/min): 60 L/min      Palliative Care Assessment & Plan   Patient Profile: 67 y.o. female admitted on 10/16/2023 with PMH COPD, Chronic hypoxia (reportedly on 8L at home, last time she saw pulm was 4L), CHF, CVA, HLD, DM2, HTN, Obesity.        On 11/30 she had a near-arrest, becoming bradycardic + associated worsening hypoxia & required atropine. Admitted with SOB and hypoxia, she has been diuresed, she has required an escalation of O2 from her baseline during this admission and at times BiPAP vs HHFNC.     She has had multiple re-admission and reported continued physical and functional decline. Patient and her family face treatment option decisions, advanced directive decisions and anticipatory care needs.  Assessment: Goals of care conversation Acute on chronic hypoxic  and hypercarbic respiratory failure Acute on chronic HFrEF AKI Cardiorenal syndrome  Acute COPD exacerbation  Recommendations/Plan: Continue DNR/DNI Continue current care and efforts to wean down oxygen Now leaning toward residential hospice placement if possible, versus inpatient transition to comfort focused care if she remains unable to get off high flow nasal cannula Psychosocial and emotional support provided PMT will continue to follow and support incrementally   Prognosis: Very poor  Discharge Planning: To Be Determined  Care plan was discussed with patient, patient's daughter, IMTS    MDM high         Ihsan Nomura Jeni Salles, PA-C  Palliative Medicine Team Team phone #  [phone]  Thank you for allowing the Palliative Medicine Team to assist in the care of this patient. Please utilize secure chat with additional questions, if there is no response within 30 minutes please call the above phone number.  Palliative Medicine Team providers are available by phone from 7am to 7pm daily and can be reached through the team cell phone.  Should this patient require assistance outside of these hours, please call the patient's attending physician.

## 2023-10-29 NOTE — Plan of Care (Signed)
  Problem: Education: Goal: Ability to describe self-care measures that may prevent or decrease complications (Diabetes Survival Skills Education) will improve Outcome: Progressing Goal: Individualized Educational Video(s) Outcome: Progressing   Problem: Coping: Goal: Ability to adjust to condition or change in health will improve Outcome: Progressing   Problem: Fluid Volume: Goal: Ability to maintain a balanced intake and output will improve Outcome: Progressing   Problem: Health Behavior/Discharge Planning: Goal: Ability to identify and utilize available resources and services will improve Outcome: Progressing Goal: Ability to manage health-related needs will improve Outcome: Progressing   Problem: Metabolic: Goal: Ability to maintain appropriate glucose levels will improve Outcome: Progressing   Problem: Nutritional: Goal: Maintenance of adequate nutrition will improve Outcome: Progressing Goal: Progress toward achieving an optimal weight will improve Outcome: Progressing   Problem: Skin Integrity: Goal: Risk for impaired skin integrity will decrease Outcome: Progressing   Problem: Tissue Perfusion: Goal: Adequacy of tissue perfusion will improve Outcome: Progressing   Problem: Education: Goal: Knowledge of General Education information will improve Description: Including pain rating scale, medication(s)/side effects and non-pharmacologic comfort measures Outcome: Progressing   Problem: Health Behavior/Discharge Planning: Goal: Ability to manage health-related needs will improve Outcome: Progressing   Problem: Clinical Measurements: Goal: Ability to maintain clinical measurements within normal limits will improve Outcome: Progressing Goal: Will remain free from infection Outcome: Progressing Goal: Diagnostic test results will improve Outcome: Progressing Goal: Respiratory complications will improve Outcome: Progressing Goal: Cardiovascular complication will  be avoided Outcome: Progressing   Problem: Activity: Goal: Risk for activity intolerance will decrease Outcome: Progressing   Problem: Nutrition: Goal: Adequate nutrition will be maintained Outcome: Progressing   Problem: Coping: Goal: Level of anxiety will decrease Outcome: Progressing   Problem: Elimination: Goal: Will not experience complications related to bowel motility Outcome: Progressing Goal: Will not experience complications related to urinary retention Outcome: Progressing   Problem: Pain Management: Goal: General experience of comfort will improve Outcome: Progressing   Problem: Safety: Goal: Ability to remain free from injury will improve Outcome: Progressing   Problem: Skin Integrity: Goal: Risk for impaired skin integrity will decrease Outcome: Progressing   Problem: Education: Goal: Knowledge of disease or condition will improve Outcome: Progressing Goal: Knowledge of the prescribed therapeutic regimen will improve Outcome: Progressing Goal: Individualized Educational Video(s) Outcome: Progressing   Problem: Activity: Goal: Ability to tolerate increased activity will improve Outcome: Progressing Goal: Will verbalize the importance of balancing activity with adequate rest periods Outcome: Progressing   Problem: Respiratory: Goal: Ability to maintain a clear airway will improve Outcome: Progressing Goal: Levels of oxygenation will improve Outcome: Progressing Goal: Ability to maintain adequate ventilation will improve Outcome: Progressing   Problem: Education: Goal: Ability to demonstrate management of disease process will improve Outcome: Progressing Goal: Ability to verbalize understanding of medication therapies will improve Outcome: Progressing Goal: Individualized Educational Video(s) Outcome: Progressing   Problem: Activity: Goal: Capacity to carry out activities will improve Outcome: Progressing   Problem: Cardiac: Goal: Ability  to achieve and maintain adequate cardiopulmonary perfusion will improve Outcome: Progressing

## 2023-10-29 NOTE — Progress Notes (Signed)
HD#13 SUBJECTIVE:  Patient Summary: Patient Summary: Alicia [name] is a 67 y.o. 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    Overnight Events: None   Interim History: Patient is evaluate at bedside, sleeping comfortably, with no obvious acute distress. She has the HHFNC 70 L saturating > 90%. She denies any concerns at this time. No nausea, vomiting, or abdominal pain. Daily total output recorded as -1100.   OBJECTIVE:  Vital Signs: Vitals:   10/29/23 0700 10/29/23 0715 10/29/23 1052 10/29/23 1242  BP: (!) 136/58  (!) 141/63   Pulse: 61 65 66   Resp: 18 (!) 24 20 20   Temp: 97.9 F (36.6 C)  99.5 F (37.5 C)   TempSrc: Axillary  Oral   SpO2: 93% 90% 93%   Weight:      Height:       Supplemental O2:  HHFNC SpO2: 93 % O2 Flow Rate (L/min): 60 L/min FiO2 (%): 100 %  Filed Weights   10/27/23 0456 10/28/23 0409 10/29/23 0646  Weight: 97.3 kg 95 kg 95 kg     Intake/Output Summary (Last 24 hours) at 10/29/2023 1318 Last data filed at 10/29/2023 1914 Gross per 24 hour  Intake 810 ml  Output 500 ml  Net 310 ml   Net IO Since Admission: -7,453.59 mL [10/29/23 1318]  Physical Exam: Physical Exam  General: Ill-appearing, laying in bed, sleeping.  Head: Normocephalic, atraumatic, wearing HHFNC  Cardiac: Regular rate, regular rhythm, trace pitting edema b/l LE Pulmonary: + bibasilar course crackles, improved yesterday   Abdomen: Soft, not tender, nondistended, bowel sounds present  Patient Lines/Drains/Airways Status     Active Line/Drains/Airways      Name Placement date Placement time Site Days   Peripheral IV 10/26/23 22 G 1" Right;Anterior;Proximal Forearm 10/26/23  2043  Forearm  3   External Urinary Catheter 10/29/23  0932  --  less than 1   Wound / Incision (Open or Dehisced) 10/20/23 (IAD) Incontinence Associated Dermatitis Buttocks Right;Left 10/20/23  1044  Buttocks  9             ASSESSMENT/PLAN:  Assessment: Active Problems:   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   Plan: #Acute on chronic hypoxic/hypercarbic respiratory failure (home  O2 8L [place]) #Acute on Chronic HF Exacerbation (EF 50-55%) #Acute COPD exacerbation   Presented with worsening dyspnea and fatigue for 1 week with increasing oxygen requirement.  Initial chest x-ray showed mild pulmonary vascular congestion with small bilateral layering pleural effusion. BNP >2000; initial VBG showed pH 7.3, bicarb 39.2, pCO2 73.2. Lung exam notable for bilateral rales and bibasilar crackles. She also had 1+ pitting edema below  mid-shin bilaterally. Echo EF 45 to 50%, decreased from echo findings on 11/24.     No concerns today, will continue to wean down oxygen as patient tolerates.    Plan: - PLAN TO WEAN OXYGEN TO CLOSER TO 15 L in the next couple of days  - Continue PO torsemide 60 mg BID  - One dose of metolazone 5 mg today  - Continue Yupelri, Budesonide and Atrovent, breathing treatments  - BiPAP at bedtime, NPO when on BiPAP - Daily weight checks with Strict Is & Os - PT/OT recommended Home health PT/OT  - Advanced heart failure team and PCCM following, appreciate their recs.   - GOAL Mg > 2.0 and Potassium > 4.0  - Palliative care consulted, appreciate their recs, considering residential hospice.    #AKI #Contraction Alkalosis  #Cardiorenal syndrome  Presented with Cr 1.13, elevated from 0.75 on 09/03/2023 likely pre-renal, in the setting of HF exacerbation. Cr continues to trend higher in the setting of high dose lasix; She also had concerns of straining while urinating in the ED. UA negative infection. Bladder scan on 11/24 showed 0 PVR. There is likely a component of cardiorenal syndrome that is playing in role in her AKI in the setting of worsening HF exacerbation.  - Continue to trent BMP    Chronic Conditions:  Type 2 Diabetes with nonproliferative diabetic retinopathy: Semglee 30 units with moderate SSI HLD: Continue lipitor 80 mg daily and zetia 10 mg daily Paroxysmal A fib: Currently NSR, continue Eliquis 5 mg BID HTN: Resumed home medication amlodipine 10 mg daily. Obesity/OHS/OSA: Wears CPAP at night, currently on BiPAP at night   Best Practice: Diet: Cardiac diet IVF: Fluids: None, Rate: None VTE: Eliquis BID Code: DNR AB: None Therapy Recs: Home Health, DME: none DISPO: Anticipated discharge  3 days  to  unknown  pending New oxygen.  Signature: Jeral Pinch, D.O.  Internal Medicine Resident, PGY-1 [facility]  Pager: [phone] 1:18 PM,  10/29/2023   Please contact the on call pager after 5 pm and on weekends at [phone].

## 2023-10-29 NOTE — Plan of Care (Signed)
  Problem: Fluid Volume: Goal: Ability to maintain a balanced intake and output will improve Outcome: Progressing   

## 2023-10-29 NOTE — Progress Notes (Signed)
NAME:  Alicia [name], MRN:  [id number], DOB:  15-Jul-1956, LOS: 13 ADMISSION DATE:  10/16/2023, CONSULTATION DATE:  10/24/23 REFERRING MD:  Merrilee Jansky - [facility], CHIEF COMPLAINT:  hypoxia    History of Present Illness:  67 yo f PMH COPD, Chronic hypoxia (reportedly on 8L at home, last time she saw pulm was 4L), HF recovered EF, CVA, HLD, DM2, HTN, Obesity who was admitted to IMT[facility] 11/23 for SOB and AoC hypoxia, and has been progressively diuresed 11/23-12/1 for a HF exacerbation with initial cxr c/w volume overload and BNP >2000. She has required an escalation of O2 from her baseline during this admission and at times BiPAP vs HHFNC.  On 11/30 she had a near-arrest, becoming bradycardic + associated worsening hypoxia & required atropine.   Despite escalating diuresis, pt remains hypoxic. On 12/1 PCCM is consulted in this setting.  Pertinent  Medical History  COPD Chronic hypoxia HFpEF  Significant Hospital Events: Including procedures, antibiotic start and stop dates in addition to other pertinent events   11/23  admit to [facility]MT[facility] on BiPAP,40 mg lasix in ED then  80 lasix after admission.  11/24 80 lasix BID -- 900cc UOP 11/23-24 11/25 given 1x solumedrol and started on pred for possible AECOPD. Lasix was stopped for AKI (1.44 from 1.3 from 1.1) 11/26 lasix restarted 80 BID 11/27 lasix increased to 120 TID. Pred stopped.  Cr 1.45 11/28 diamox added, lasix de-escalated to 120 BID. Improved renal fxn cr 1.08   11/29 pt apparently brought up end stage dz process questions. Primary plans to organize family meeting  11/30 a code blue was calle d, she did not lose pulses but did require atropine for symptomatic bradycardia. Family meeting later that day, remains full code full scope of offered care.  12/1 PCCM consulted given lack of clinical improvement & ongoing desat events.   Interim History / Subjective:  Failed 15 L during the night and required BiPAP   Objective   Blood pressure (!) 136/58,  pulse 65, temperature 97.9 F (36.6 C), temperature source Axillary, resp. rate (!) 24, height 5\' 7"  (1.702 m), weight 95 kg, SpO2 90%.    Vent Mode: PCV;BIPAP FiO2 (%):  [90 %-100 %] 100 % Set Rate:  [15 bmp] 15 bmp PEEP:  [10 cmH20] 10 cmH20   Intake/Output Summary (Last 24 hours) at 10/29/2023 1013 Last data filed at 10/29/2023 3244 Gross per 24 hour  Intake 810 ml  Output 1100 ml  Net -290 ml   Filed Weights   10/27/23 0456 10/28/23 0409 10/29/23 0646  Weight: 97.3 kg 95 kg 95 kg    Examination: Morbid obese female sitting on side of the bed on 100% oxygen She is hunched over requested to sit up straight to improve her breathing Abdomen is obese soft Extremities with edema   Recent Labs  Lab 10/27/23 0231 10/28/23 0225 10/29/23 0235  NA 137 136 135  K 3.5 4.1 3.8  CL 89* 87* 86*  CO2 39* 33* 36*  BUN 49* 54* 53*  CREATININE 1.42* 1.54* 1.52*  GLUCOSE 106* 128* 122*   Recent Labs  Lab 10/26/23 0801 10/28/23 0225 10/29/23 0235  HGB 10.5* 9.9* 9.3*  HCT 34.6* 31.3* 30.2*  WBC 8.5 9.9 11.0*  PLT 180 182 185     Resolved Hospital Problem list     Assessment & Plan:   Acute on chronic hypoxic and hypercarbic respiratory failure-baseline 8 L oxygen AoC HFrEF COPD Bilateral pleural effusions vs atelectasis AKI Lab Results  Component Value Date   CREATININE 1.52 (H) 10/29/2023   CREATININE 1.54 (H) 10/28/2023   CREATININE 1.42 (H) 10/27/2023   CREATININE 0.63 08/28/2015   CREATININE 0.44 (L) 02/27/2014   CREATININE 0.46 (L) 11/01/2013    Discussion She has severe hypoxia in spite of 6 L negative balance, no evidence of pneumonia.  BNP remains slight high PFTs 06/2018 has shown moderate to severe restriction with FEV1 45% and FVC 43%.  She quit smoking in 2020.  Have to rule out right-to-left shunt Previous echo showed severe pulm hypertension, bubble study indeterminate.  She has a failed attempt to get a 15 L oxygen flow.  Desaturated and required  noninvasive mechanical ventilatory support overnight back on 60 L and 100% FiO2  Plan Continue BiPAP and O2 as needed Not sure if she will be able to be discharged from the hospital Continue current interventions        Would continue palliative conversations, we do not have other therapeutic options  Best Practice (right click and "Reselect all SmartList Selections" daily)   Per primary team  Labs   CBC: Recent Labs  Lab 10/24/23 0244 10/26/23 0801 10/28/23 0225 10/29/23 0235  WBC 7.9 8.5 9.9 11.0*  HGB 9.9* 10.5* 9.9* 9.3*  HCT 33.9* 34.6* 31.3* 30.2*  MCV 87.4 84.2 81.7 82.3  PLT 218 180 182 185    Basic Metabolic Panel: Recent Labs  Lab 10/25/23 1008 10/26/23 0245 10/27/23 0231 10/28/23 0225 10/29/23 0235  NA 136 135 137 136 135  K 4.1 3.6 3.5 4.1 3.8  CL 90* 88* 89* 87* 86*  CO2 36* 37* 39* 33* 36*  GLUCOSE 210* 174* 106* 128* 122*  BUN 48* 49* 49* 54* 53*  CREATININE 1.30* 1.34* 1.42* 1.54* 1.52*  CALCIUM 9.1 8.9 8.9 9.1 9.0  MG 2.0 1.8 2.3 2.0 1.9   GFR: Estimated Creatinine Clearance: 43.7 mL/min (A) (by C-G formula based on SCr of 1.52 mg/dL (H)). Recent Labs  Lab 10/24/23 0244 10/24/23 1556 10/26/23 0801 10/28/23 0225 10/29/23 0235  PROCALCITON  --  <0.10  --   --   --   WBC 7.9  --  8.5 9.9 11.0*    Liver Function Tests: Recent Labs  Lab 10/24/23 1959  AST 14*  ALT 13  ALKPHOS 63  BILITOT 0.5  PROT 5.8*  ALBUMIN 2.7*   No results for input(s): "LIPASE", "AMYLASE" in the last 168 hours. No results for input(s): "AMMONIA" in the last 168 hours.  ABG    Component Value Date/Time   PHART 7.39 10/23/2023 1939   PCO2ART 68 (HH) 10/23/2023 1939   PO2ART 68 (L) 10/23/2023 1939   HCO3 41.8 (H) 10/23/2023 1939   TCO2 41 (H) 10/16/2023 1428   O2SAT 95.3 10/23/2023 1939     Coagulation Profile: No results for input(s): "INR", "PROTIME" in the last 168 hours.  Cardiac Enzymes: No results for input(s): "CKTOTAL", "CKMB",  "CKMBINDEX", "TROPONINI" in the last 168 hours.  HbA1C: Hemoglobin A1C  Date/Time Value Ref Range Status  09/03/2023 09:19 AM 8.1 (A) 4.0 - 5.6 % Final  05/03/2023 08:50 AM 8.2 (A) 4.0 - 5.6 % Final   Hgb A1c MFr Bld  Date/Time Value Ref Range Status  02/09/2022 11:00 AM 8.1 (H) 4.8 - 5.6 % Final    Comment:             Prediabetes: 5.7 - 6.4          Diabetes: >6.4  Glycemic control for adults with diabetes: <7.0   08/03/2017 07:19 AM 11.0 (H) 4.8 - 5.6 % Final    Comment:    (NOTE) Pre diabetes:          5.7%-6.4% Diabetes:              >6.4% Glycemic control for   <7.0% adults with diabetes     CBG: Recent Labs  Lab 10/28/23 1044 10/28/23 1426 10/28/23 1551 10/28/23 2110 10/29/23 0554  GLUCAP 190* 309* 298* 182* 106*    Steve Idora Brosious ACNP Acute Care Nurse Practitioner [facility] Please consult Amion 10/29/2023, 10:13 AM

## 2023-10-29 NOTE — Plan of Care (Signed)
Problem: Education: Goal: Ability to describe self-care measures that may prevent or decrease complications (Diabetes Survival Skills Education) will improve 10/29/2023 1331 by Louanne Belton, RN Outcome: Progressing 10/29/2023 1330 by Louanne Belton, RN Outcome: Progressing Goal: Individualized Educational Video(s) 10/29/2023 1331 by Louanne Belton, RN Outcome: Progressing 10/29/2023 1330 by Louanne Belton, RN Outcome: Progressing   Problem: Coping: Goal: Ability to adjust to condition or change in health will improve 10/29/2023 1331 by Louanne Belton, RN Outcome: Progressing 10/29/2023 1330 by Louanne Belton, RN Outcome: Progressing   Problem: Fluid Volume: Goal: Ability to maintain a balanced intake and output will improve 10/29/2023 1331 by Louanne Belton, RN Outcome: Progressing 10/29/2023 1330 by Louanne Belton, RN Outcome: Progressing   Problem: Health Behavior/Discharge Planning: Goal: Ability to identify and utilize available resources and services will improve 10/29/2023 1331 by Louanne Belton, RN Outcome: Progressing 10/29/2023 1330 by Louanne Belton, RN Outcome: Progressing Goal: Ability to manage health-related needs will improve 10/29/2023 1331 by Louanne Belton, RN Outcome: Progressing 10/29/2023 1330 by Louanne Belton, RN Outcome: Progressing   Problem: Metabolic: Goal: Ability to maintain appropriate glucose levels will improve 10/29/2023 1331 by Louanne Belton, RN Outcome: Progressing 10/29/2023 1330 by Louanne Belton, RN Outcome: Progressing   Problem: Nutritional: Goal: Maintenance of adequate nutrition will improve 10/29/2023 1331 by Louanne Belton, RN Outcome: Progressing 10/29/2023 1330 by Louanne Belton, RN Outcome: Progressing Goal: Progress toward achieving an optimal weight will improve 10/29/2023 1331 by Louanne Belton, RN Outcome: Progressing 10/29/2023 1330 by Louanne Belton, RN Outcome: Progressing   Problem: Skin  Integrity: Goal: Risk for impaired skin integrity will decrease 10/29/2023 1331 by Louanne Belton, RN Outcome: Progressing 10/29/2023 1330 by Louanne Belton, RN Outcome: Progressing   Problem: Tissue Perfusion: Goal: Adequacy of tissue perfusion will improve 10/29/2023 1331 by Louanne Belton, RN Outcome: Progressing 10/29/2023 1330 by Louanne Belton, RN Outcome: Progressing   Problem: Education: Goal: Knowledge of General Education information will improve Description: Including pain rating scale, medication(s)/side effects and non-pharmacologic comfort measures 10/29/2023 1331 by Louanne Belton, RN Outcome: Progressing 10/29/2023 1330 by Louanne Belton, RN Outcome: Progressing   Problem: Health Behavior/Discharge Planning: Goal: Ability to manage health-related needs will improve 10/29/2023 1331 by Louanne Belton, RN Outcome: Progressing 10/29/2023 1330 by Louanne Belton, RN Outcome: Progressing   Problem: Clinical Measurements: Goal: Ability to maintain clinical measurements within normal limits will improve 10/29/2023 1331 by Louanne Belton, RN Outcome: Progressing 10/29/2023 1330 by Louanne Belton, RN Outcome: Progressing Goal: Will remain free from infection 10/29/2023 1331 by Louanne Belton, RN Outcome: Progressing 10/29/2023 1330 by Louanne Belton, RN Outcome: Progressing Goal: Diagnostic test results will improve 10/29/2023 1331 by Louanne Belton, RN Outcome: Progressing 10/29/2023 1330 by Louanne Belton, RN Outcome: Progressing Goal: Respiratory complications will improve 10/29/2023 1331 by Louanne Belton, RN Outcome: Progressing 10/29/2023 1330 by Louanne Belton, RN Outcome: Progressing Goal: Cardiovascular complication will be avoided 10/29/2023 1331 by Louanne Belton, RN Outcome: Progressing 10/29/2023 1330 by Louanne Belton, RN Outcome: Progressing   Problem: Activity: Goal: Risk for activity intolerance will decrease 10/29/2023 1331 by Louanne Belton, RN Outcome: Progressing 10/29/2023 1330 by Louanne Belton, RN Outcome: Progressing   Problem: Nutrition: Goal: Adequate nutrition will be maintained 10/29/2023 1331 by Louanne Belton, RN Outcome: Progressing 10/29/2023 1330 by Louanne Belton, RN Outcome: Progressing  Problem: Coping: Goal: Level of anxiety will decrease 10/29/2023 1331 by Louanne Belton, RN Outcome: Progressing 10/29/2023 1330 by Louanne Belton, RN Outcome: Progressing   Problem: Elimination: Goal: Will not experience complications related to bowel motility 10/29/2023 1331 by Louanne Belton, RN Outcome: Progressing 10/29/2023 1330 by Louanne Belton, RN Outcome: Progressing Goal: Will not experience complications related to urinary retention 10/29/2023 1331 by Louanne Belton, RN Outcome: Progressing 10/29/2023 1330 by Louanne Belton, RN Outcome: Progressing   Problem: Pain Management: Goal: General experience of comfort will improve 10/29/2023 1331 by Louanne Belton, RN Outcome: Progressing 10/29/2023 1330 by Louanne Belton, RN Outcome: Progressing   Problem: Safety: Goal: Ability to remain free from injury will improve 10/29/2023 1331 by Louanne Belton, RN Outcome: Progressing 10/29/2023 1330 by Louanne Belton, RN Outcome: Progressing   Problem: Skin Integrity: Goal: Risk for impaired skin integrity will decrease 10/29/2023 1331 by Louanne Belton, RN Outcome: Progressing 10/29/2023 1330 by Louanne Belton, RN Outcome: Progressing   Problem: Education: Goal: Knowledge of disease or condition will improve 10/29/2023 1331 by Louanne Belton, RN Outcome: Progressing 10/29/2023 1330 by Louanne Belton, RN Outcome: Progressing Goal: Knowledge of the prescribed therapeutic regimen will improve 10/29/2023 1331 by Louanne Belton, RN Outcome: Progressing 10/29/2023 1330 by Louanne Belton, RN Outcome: Progressing Goal: Individualized Educational Video(s) 10/29/2023 1331 by Louanne Belton,  RN Outcome: Progressing 10/29/2023 1330 by Louanne Belton, RN Outcome: Progressing   Problem: Activity: Goal: Ability to tolerate increased activity will improve 10/29/2023 1331 by Louanne Belton, RN Outcome: Progressing 10/29/2023 1330 by Louanne Belton, RN Outcome: Progressing Goal: Will verbalize the importance of balancing activity with adequate rest periods 10/29/2023 1331 by Louanne Belton, RN Outcome: Progressing 10/29/2023 1330 by Louanne Belton, RN Outcome: Progressing   Problem: Respiratory: Goal: Ability to maintain a clear airway will improve 10/29/2023 1331 by Louanne Belton, RN Outcome: Progressing 10/29/2023 1330 by Louanne Belton, RN Outcome: Progressing Goal: Levels of oxygenation will improve 10/29/2023 1331 by Louanne Belton, RN Outcome: Progressing 10/29/2023 1330 by Louanne Belton, RN Outcome: Progressing Goal: Ability to maintain adequate ventilation will improve 10/29/2023 1331 by Louanne Belton, RN Outcome: Progressing 10/29/2023 1330 by Louanne Belton, RN Outcome: Progressing   Problem: Education: Goal: Ability to demonstrate management of disease process will improve 10/29/2023 1331 by Louanne Belton, RN Outcome: Progressing 10/29/2023 1330 by Louanne Belton, RN Outcome: Progressing Goal: Ability to verbalize understanding of medication therapies will improve 10/29/2023 1331 by Louanne Belton, RN Outcome: Progressing 10/29/2023 1330 by Louanne Belton, RN Outcome: Progressing Goal: Individualized Educational Video(s) 10/29/2023 1331 by Louanne Belton, RN Outcome: Progressing 10/29/2023 1330 by Louanne Belton, RN Outcome: Progressing   Problem: Activity: Goal: Capacity to carry out activities will improve 10/29/2023 1331 by Louanne Belton, RN Outcome: Progressing 10/29/2023 1330 by Louanne Belton, RN Outcome: Progressing   Problem: Cardiac: Goal: Ability to achieve and maintain adequate cardiopulmonary perfusion will  improve 10/29/2023 1331 by Louanne Belton, RN Outcome: Progressing 10/29/2023 1330 by Louanne Belton, RN Outcome: Progressing

## 2023-10-29 NOTE — Progress Notes (Signed)
Physical Therapy Treatment Patient Details Name: Alicia [name] MRN: [id number] DOB: 05-31-1956 Today's Date: 10/29/2023   History of Present Illness Pt is 67 yo presenting to [facility] ED on 11/23 due to worsening shortness of breath and fatigue and admitted for acute on chronic hypoxic respiratory failure secondary to CHF exacerbation. PMH: COPD, CHF, DM-2, diabetic retinopathy, SA node dysfunction, Hx CVA, CAD, and recent admissions for PNA and COVID-19 in 06/2023 and 07/2023.    PT Comments  Focused session on lower extremity strengthening and endurance training. Pt demonstrates deficits in quads endurance, having difficulty sustaining full knee extension against gravity for up to ~3 seconds. This impacts her standing tolerance. Her continued pulmonary deficits also impact her activity tolerance. She fatigues quickly and needed to return to supine to rest after just a couple exercises and x1 transfer from the commode to the bed. Pt did just finish washing up with NT prior to PT session though. Will continue to follow acutely.     If plan is discharge home, recommend the following: A lot of help with walking and/or transfers;A little help with bathing/dressing/bathroom;Help with stairs or ramp for entrance;Assistance with cooking/housework;Assist for transportation   Can travel by private vehicle        Equipment Recommendations  Wheelchair (measurements PT);Wheelchair cushion (measurements PT);BSC/3in1;Hospital bed    Recommendations for Other Services       Precautions / Restrictions Precautions Precautions: Fall Precaution Comments: watch O2, on HHFNC Restrictions Weight Bearing Restrictions: No     Mobility  Bed Mobility Overal bed mobility: Needs Assistance Bed Mobility: Sit to Supine       Sit to supine: Contact guard assist   General bed mobility comments: CGA for safety    Transfers Overall transfer level: Needs assistance Equipment used: None Transfers: Sit to/from  Stand, Bed to chair/wheelchair/BSC Sit to Stand: Min assist, Contact guard assist   Step pivot transfers: Contact guard assist       General transfer comment: Pt able to power up to stand from commode with CGA but needed minA to control her descend to sit once she step pivoted to L from commode to bed. CGA for step pivot with pt reaching hands from commode to bed for support    Ambulation/Gait Ambulation/Gait assistance: Contact guard assist Gait Distance (Feet): 2 Feet Assistive device: None Gait Pattern/deviations: Step-to pattern, Decreased step length - right, Decreased step length - left, Decreased stride length, Trunk flexed Gait velocity: reduced Gait velocity interpretation: <1.31 ft/sec, indicative of household ambulator   General Gait Details: Pt took small, pivotal steps from commode to bed with hands alternating from commode to bed for support, flexed posture noted, CGA for safety   Stairs             Wheelchair Mobility     Tilt Bed    Modified Rankin (Stroke Patients Only)       Balance Overall balance assessment: Needs assistance Sitting-balance support: No upper extremity supported Sitting balance-Leahy Scale: Good     Standing balance support: Bilateral upper extremity supported Standing balance-Leahy Scale: Poor Standing balance comment: UE support for balance                            Cognition Arousal: Alert Behavior During Therapy: WFL for tasks assessed/performed Overall Cognitive Status: Within Functional Limits for tasks assessed  Exercises General Exercises - Lower Extremity Long Arc Quad: AROM, Strengthening, Both, 10 reps, Seated (x3 second isometric hold when extended) Hip Flexion/Marching: AROM, Strengthening, Both, 10 reps, Seated    General Comments General comments (skin integrity, edema, etc.): SpO2 >/= 89% on HHFNC      Pertinent Vitals/Pain Pain  Assessment Pain Assessment: Faces Faces Pain Scale: No hurt Pain Intervention(s): Monitored during session    Home Living                          Prior Function            PT Goals (current goals can now be found in the care plan section) Acute Rehab PT Goals Patient Stated Goal: to return home PT Goal Formulation: With patient Time For Goal Achievement: 11/20/2023 Potential to Achieve Goals: Fair Progress towards PT goals: Progressing toward goals    Frequency    Min 1X/week      PT Plan      Co-evaluation              AM-PAC PT "6 Clicks" Mobility   Outcome Measure  Help needed turning from your back to your side while in a flat bed without using bedrails?: A Little Help needed moving from lying on your back to sitting on the side of a flat bed without using bedrails?: A Little Help needed moving to and from a bed to a chair (including a wheelchair)?: A Little Help needed standing up from a chair using your arms (e.g., wheelchair or bedside chair)?: A Little Help needed to walk in hospital room?: Total Help needed climbing 3-5 steps with a railing? : Total 6 Click Score: 14    End of Session Equipment Utilized During Treatment: Gait belt;Oxygen Activity Tolerance: Patient limited by fatigue Patient left: with call bell/phone within reach;in bed;with bed alarm set Nurse Communication: Mobility status;Other (comment) (SpO2) PT Visit Diagnosis: Other abnormalities of gait and mobility (R26.89);Muscle weakness (generalized) (M62.81);Unsteadiness on feet (R26.81);Difficulty in walking, not elsewhere classified (R26.2)     Time: 1610-9604 PT Time Calculation (min) (ACUTE ONLY): 21 min  Charges:    $Therapeutic Exercise: 8-22 mins PT General Charges $$ ACUTE PT VISIT: 1 Visit                     Virgil Benedict, PT, DPT Acute Rehabilitation Services  Office: [phone]    Bettina Gavia 10/29/2023, 1:11 PM

## 2023-10-29 NOTE — Progress Notes (Signed)
OT Cancellation Note  Patient Details Name: Alicia [name] MRN: [id number] DOB: 19-Oct-1956   Cancelled Treatment:    Reason Eval/Treat Not Completed: Patient declined, no reason specified;Medical issues which prohibited therapy (Pt continuing to desat quickly. RN gave ok for pt to participate in OT session from bed level. However, pt declines OT this day due to fatigue. OT to continue to follow pt acutely as appropriate/available.)  Rosanne Sack "Orson Eva., OTR/L, MA Acute Rehab [phone]   Lendon Colonel 10/29/2023, 3:48 PM

## 2023-10-30 ENCOUNTER — Inpatient Hospital Stay (HOSPITAL_COMMUNITY): Payer: 59

## 2023-10-30 LAB — MAGNESIUM: Magnesium: 2.1 mg/dL (ref 1.7–2.4)

## 2023-10-30 LAB — BASIC METABOLIC PANEL
Anion gap: 13 (ref 5–15)
BUN: 56 mg/dL — ABNORMAL HIGH (ref 8–23)
CO2: 36 mmol/L — ABNORMAL HIGH (ref 22–32)
Calcium: 8.8 mg/dL — ABNORMAL LOW (ref 8.9–10.3)
Chloride: 85 mmol/L — ABNORMAL LOW (ref 98–111)
Creatinine, Ser: 1.39 mg/dL — ABNORMAL HIGH (ref 0.44–1.00)
GFR, Estimated: 42 mL/min — ABNORMAL LOW (ref >60)
Glucose, Bld: 91 mg/dL (ref 70–99)
Potassium: 3.9 mmol/L (ref 3.5–5.1)
Sodium: 134 mmol/L — ABNORMAL LOW (ref 135–145)

## 2023-10-30 LAB — CBC
HCT: 28.7 % — ABNORMAL LOW (ref 36.0–46.0)
Hemoglobin: 9.4 g/dL — ABNORMAL LOW (ref 12.0–15.0)
MCH: 26 pg (ref 26.0–34.0)
MCHC: 32.8 g/dL (ref 30.0–36.0)
MCV: 79.5 fL — ABNORMAL LOW (ref 80.0–100.0)
Platelets: 140 10*3/uL — ABNORMAL LOW (ref 150–400)
RBC: 3.61 MIL/uL — ABNORMAL LOW (ref 3.87–5.11)
RDW: 16.8 % — ABNORMAL HIGH (ref 11.5–15.5)
WBC: 12.8 10*3/uL — ABNORMAL HIGH (ref 4.0–10.5)
nRBC: 0 % (ref 0.0–0.2)

## 2023-10-30 LAB — GLUCOSE, CAPILLARY
Glucose-Capillary: 70 mg/dL (ref 70–99)
Glucose-Capillary: 101 mg/dL — ABNORMAL HIGH (ref 70–99)
Glucose-Capillary: 92 mg/dL (ref 70–99)
Glucose-Capillary: 138 mg/dL — ABNORMAL HIGH (ref 70–99)
Glucose-Capillary: 146 mg/dL — ABNORMAL HIGH (ref 70–99)

## 2023-10-30 MED ORDER — METOLAZONE 5 MG PO TABS
5.0000 mg | ORAL_TABLET | Freq: Once | ORAL | Status: AC
  Administered 2023-10-30: 5 mg via ORAL
  Filled 2023-10-30: qty 1

## 2023-10-30 NOTE — Progress Notes (Signed)
Family at bedside.  RN spoke with daughter informing her that patient will be allowed to have he visitors.  Educated daughter that pt is unable to be on HFNC for extended periods.

## 2023-10-30 NOTE — Plan of Care (Signed)
Pt able to talk about pending prognosis and appears to be accepting of diagnosis

## 2023-10-30 NOTE — Progress Notes (Signed)
Patient placed o HFNC and her SATs now 84% while eating.

## 2023-10-30 NOTE — Progress Notes (Signed)
HD#14 SUBJECTIVE:  Patient Summary: Patient Summary: Alicia [name] is a 67 y.o. 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    Overnight Events: Febrile up to 101 yesterday evening, received Tylenol  Interim History: Patient is evaluated this morning.  Denies any new concerns.  Recalls temperature elevated yesterday evening.  Denies any new cough, dysuria or diarrhea.  Currently on BiPAP.  OBJECTIVE:  Vital Signs: Vitals:   10/29/23 1929 10/30/23 0007 10/30/23 0308 10/30/23 0533  BP:  (!) 149/69  124/60  Pulse:  69 (!) 55 (!) 55  Resp:  19 20 19   Temp:  (!) 101.9 F (38.8 C)  99.6 F (37.6 C)  TempSrc:  Axillary  Axillary  SpO2: 95% 96% 94%   Weight:    92.9 kg  Height:       Supplemental O2:  HHFNC SpO2: 96 % O2 Flow Rate (L/min): 60 L/min FiO2 (%): 100 %  Filed Weights   10/28/23 0409 10/29/23 0646 10/30/23 0533  Weight: 95 kg 95 kg 92.9 kg     Intake/Output Summary (Last 24 hours) at 10/30/2023 0545 Last data filed at 10/30/2023 0011 Gross per 24 hour  Intake 410.24 ml  Output 1200 ml  Net -789.76 ml   Net IO Since Admission: -8,603.35 mL [10/30/23 0545]  Physical Exam:  General: Alert, chronically ill-appearing female laying in bed comfortably, in no acute distress Head: Normocephalic, atraumatic, wearing BiPAP Cardiac: Regular rate but at times bradycardic to mid 50s, regular rhythm, trace pitting edema bilateral LE Pulmonary: On BiPAP, bibasilar coarse crackles with some improvement compared to yesterday Neuro: Awake and  alert  Patient Lines/Drains/Airways Status     Active Line/Drains/Airways     Name Placement date Placement time Site Days   Peripheral IV 10/26/23 22 G 1" Right;Anterior;Proximal Forearm 10/26/23  2043  Forearm  3   External Urinary Catheter 10/29/23  0932  --  less than 1   Wound / Incision (Open or Dehisced) 10/20/23 (IAD) Incontinence Associated Dermatitis Buttocks Right;Left 10/20/23  1044  Buttocks  9             ASSESSMENT/PLAN:  Assessment: Active Problems:   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   Plan: #Acute on chronic  hypoxic/hypercarbic respiratory failure (home O2 8L [place]) #Acute on Chronic HFmrEF (EF 45-50%) #Acute COPD exacerbation   Presented with worsening dyspnea and fatigue for 1 week with increasing oxygen requirement.  Initial chest x-ray showed mild pulmonary vascular congestion with small bilateral layering pleural effusion. BNP >2000; Echo EF 45 to 50%, decreased from echo findings on 11/24.   Urinary output of  1.2 L and net negative yesterday.  Weight is trending down to 93 kg from 95 kg.  Respiratory status remains alternating with HHFNC and BiPAP.  Our team along with palliative team have been engaging with GOC with patient and patient's daughter.  Goal is to wean down to 15 L but has been difficult, consideration of whether patient can go to residential hospice.  Plan -Appreciate advanced heart failure team, PCCM assistance -Appreciate palliative care team assistance -Continue working on weaning O2 to 15 L if tolerated -Continue torsemide 60 mg twice daily -Continue metolazone 5 mg 1 dose today -Replete electrolytes as needed, goal K>4 and Mg >2 -Strict I's and O's and daily weight -BiPAP at bedtime, n.p.o. when off BiPAP   #AKI #Contraction Alkalosis  #Cardiorenal syndrome  Renal function about the same compared to yesterday with slight improvement of creatinine today.  Has received Diamox for contraction alkalosis.  Continue to work on O2 status and diuresis. -Trend BMP  #Leukocytosis Noted leukocytosis of 12.8 today and fever noted 101 yesterday evening.  Patient denies any new cough, dysuria or urinary symptoms, diarrhea. She is afebrile this morning. Of note, patient has been hospitalized since 11/23.  -Repeat CXR -Trend fever curve   Chronic Conditions:  Type 2 Diabetes with nonproliferative diabetic retinopathy: Continue Semglee 30 units daily with moderate SSI HLD: Continue lipitor 80 mg daily and zetia 10 mg daily Paroxysmal A fib: Currently NSR, continue Eliquis 5 mg BID HTN: Continue home amlodipine 10 mg daily. Obesity/OHS/OSA: Wears CPAP at night, currently on BiPAP at night   Best Practice: Diet: Renal with 1200 ml fluid restriction IVF: Fluids: None, Rate: None VTE: Eliquis BID Code: DNR/DNI AB: None Therapy Recs: Home Health, DME: none DISPO: Anticipated discharge  3-4 days  to  potentially residential hospice  pending New oxygen need.  Signature: Rana Snare,  DO Internal Medicine Resident PGY-2 Pager: [phone] Please contact the on-call pager after 5 pm and on weekends at [phone].  5:45 AM, 10/30/2023

## 2023-10-30 NOTE — Plan of Care (Signed)
  Problem: Education: Goal: Ability to describe self-care measures that may prevent or decrease complications (Diabetes Survival Skills Education) will improve Outcome: Progressing Goal: Individualized Educational Video(s) Outcome: Progressing   Problem: Coping: Goal: Ability to adjust to condition or change in health will improve Outcome: Progressing   Problem: Fluid Volume: Goal: Ability to maintain a balanced intake and output will improve Outcome: Progressing   Problem: Health Behavior/Discharge Planning: Goal: Ability to identify and utilize available resources and services will improve Outcome: Progressing Goal: Ability to manage health-related needs will improve Outcome: Progressing   Problem: Metabolic: Goal: Ability to maintain appropriate glucose levels will improve Outcome: Progressing   Problem: Nutritional: Goal: Maintenance of adequate nutrition will improve Outcome: Progressing Goal: Progress toward achieving an optimal weight will improve Outcome: Progressing   Problem: Skin Integrity: Goal: Risk for impaired skin integrity will decrease Outcome: Progressing   Problem: Tissue Perfusion: Goal: Adequacy of tissue perfusion will improve Outcome: Progressing   Problem: Education: Goal: Knowledge of General Education information will improve Description: Including pain rating scale, medication(s)/side effects and non-pharmacologic comfort measures Outcome: Progressing   Problem: Health Behavior/Discharge Planning: Goal: Ability to manage health-related needs will improve Outcome: Progressing   Problem: Clinical Measurements: Goal: Ability to maintain clinical measurements within normal limits will improve Outcome: Progressing Goal: Will remain free from infection Outcome: Progressing Goal: Diagnostic test results will improve Outcome: Progressing Goal: Respiratory complications will improve Outcome: Progressing Goal: Cardiovascular complication will  be avoided Outcome: Progressing   Problem: Activity: Goal: Risk for activity intolerance will decrease Outcome: Progressing   Problem: Nutrition: Goal: Adequate nutrition will be maintained Outcome: Progressing   Problem: Coping: Goal: Level of anxiety will decrease Outcome: Progressing   Problem: Elimination: Goal: Will not experience complications related to bowel motility Outcome: Progressing Goal: Will not experience complications related to urinary retention Outcome: Progressing   Problem: Pain Management: Goal: General experience of comfort will improve Outcome: Progressing   Problem: Safety: Goal: Ability to remain free from injury will improve Outcome: Progressing   Problem: Skin Integrity: Goal: Risk for impaired skin integrity will decrease Outcome: Progressing   Problem: Education: Goal: Knowledge of disease or condition will improve Outcome: Progressing Goal: Knowledge of the prescribed therapeutic regimen will improve Outcome: Progressing Goal: Individualized Educational Video(s) Outcome: Progressing   Problem: Respiratory: Goal: Ability to maintain a clear airway will improve Outcome: Progressing Goal: Levels of oxygenation will improve Outcome: Progressing Goal: Ability to maintain adequate ventilation will improve Outcome: Progressing   Problem: Education: Goal: Ability to demonstrate management of disease process will improve Outcome: Progressing Goal: Ability to verbalize understanding of medication therapies will improve Outcome: Progressing Goal: Individualized Educational Video(s) Outcome: Progressing   Problem: Activity: Goal: Capacity to carry out activities will improve Outcome: Progressing   Problem: Cardiac: Goal: Ability to achieve and maintain adequate cardiopulmonary perfusion will improve Outcome: Progressing

## 2023-10-31 ENCOUNTER — Inpatient Hospital Stay (HOSPITAL_COMMUNITY): Payer: 59

## 2023-10-31 DIAGNOSIS — Z7189 Other specified counseling: Secondary | ICD-10-CM | POA: Diagnosis not present

## 2023-10-31 DIAGNOSIS — J9621 Acute and chronic respiratory failure with hypoxia: Secondary | ICD-10-CM | POA: Diagnosis not present

## 2023-10-31 LAB — CBC
HCT: 28.8 % — ABNORMAL LOW (ref 36.0–46.0)
Hemoglobin: 9.1 g/dL — ABNORMAL LOW (ref 12.0–15.0)
MCH: 25.9 pg — ABNORMAL LOW (ref 26.0–34.0)
MCHC: 31.6 g/dL (ref 30.0–36.0)
MCV: 81.8 fL (ref 80.0–100.0)
Platelets: 207 10*3/uL (ref 150–400)
RBC: 3.52 MIL/uL — ABNORMAL LOW (ref 3.87–5.11)
RDW: 17.3 % — ABNORMAL HIGH (ref 11.5–15.5)
WBC: 17.6 10*3/uL — ABNORMAL HIGH (ref 4.0–10.5)
nRBC: 0.2 % (ref 0.0–0.2)

## 2023-10-31 LAB — BASIC METABOLIC PANEL
Anion gap: 16 — ABNORMAL HIGH (ref 5–15)
BUN: 61 mg/dL — ABNORMAL HIGH (ref 8–23)
CO2: 34 mmol/L — ABNORMAL HIGH (ref 22–32)
Calcium: 8.5 mg/dL — ABNORMAL LOW (ref 8.9–10.3)
Chloride: 81 mmol/L — ABNORMAL LOW (ref 98–111)
Creatinine, Ser: 1.78 mg/dL — ABNORMAL HIGH (ref 0.44–1.00)
GFR, Estimated: 31 mL/min — ABNORMAL LOW (ref >60)
Glucose, Bld: 175 mg/dL — ABNORMAL HIGH (ref 70–99)
Potassium: 3.4 mmol/L — ABNORMAL LOW (ref 3.5–5.1)
Sodium: 131 mmol/L — ABNORMAL LOW (ref 135–145)

## 2023-10-31 LAB — GLUCOSE, CAPILLARY: Glucose-Capillary: 143 mg/dL — ABNORMAL HIGH (ref 70–99)

## 2023-10-31 LAB — MAGNESIUM: Magnesium: 2 mg/dL (ref 1.7–2.4)

## 2023-10-31 MED ORDER — GLYCOPYRROLATE 0.2 MG/ML IJ SOLN: 0.2000 mg | INTRAMUSCULAR | Status: DC | PRN

## 2023-10-31 MED ORDER — MORPHINE SULFATE (PF) 2 MG/ML IV SOLN
1.0000 mg | INTRAVENOUS | Status: DC | PRN
  Administered 2023-10-31 (×2): 1 mg via INTRAVENOUS
  Filled 2023-10-31 (×2): qty 1

## 2023-10-31 MED ORDER — LORAZEPAM 1 MG PO TABS: 1.0000 mg | ORAL_TABLET | ORAL | Status: DC | PRN

## 2023-10-31 MED ORDER — GLYCOPYRROLATE 1 MG PO TABS: 1.0000 mg | ORAL_TABLET | ORAL | Status: DC | PRN

## 2023-10-31 MED ORDER — MORPHINE SULFATE (PF) 2 MG/ML IV SOLN: 0.5000 mg | INTRAVENOUS | Status: DC | PRN

## 2023-10-31 MED ORDER — MORPHINE SULFATE (CONCENTRATE) 10 MG /0.5 ML PO SOLN: 5.0000 mg | ORAL | Status: DC | PRN

## 2023-10-31 MED ORDER — BIOTENE DRY MOUTH MT LIQD: 15.0000 mL | OROMUCOSAL | Status: DC | PRN

## 2023-10-31 MED ORDER — POLYVINYL ALCOHOL 1.4 % OP SOLN: 1.0000 [drp] | Freq: Four times a day (QID) | OPHTHALMIC | Status: DC | PRN

## 2023-10-31 MED ORDER — LORAZEPAM 2 MG/ML IJ SOLN
1.0000 mg | INTRAMUSCULAR | Status: DC | PRN
  Administered 2023-10-31: 1 mg via INTRAVENOUS
  Filled 2023-10-31: qty 1

## 2023-10-31 MED ORDER — ONDANSETRON HCL 4 MG/2ML IJ SOLN: 4.0000 mg | Freq: Four times a day (QID) | INTRAMUSCULAR | Status: DC | PRN

## 2023-10-31 MED ORDER — LORAZEPAM 2 MG/ML PO CONC: 1.0000 mg | ORAL | Status: DC | PRN

## 2023-10-31 MED ORDER — FUROSEMIDE 10 MG/ML IJ SOLN: 60.0000 mg | Freq: Once | INTRAMUSCULAR | Status: DC

## 2023-10-31 MED ORDER — ONDANSETRON 4 MG PO TBDP: 4.0000 mg | ORAL_TABLET | Freq: Four times a day (QID) | ORAL | Status: DC | PRN

## 2023-10-31 MED ORDER — MORPHINE SULFATE (PF) 2 MG/ML IV SOLN
1.0000 mg | INTRAVENOUS | Status: DC | PRN
  Administered 2023-10-31: 1 mg via INTRAVENOUS
  Filled 2023-10-31: qty 1

## 2023-10-31 MED ORDER — GLYCOPYRROLATE 0.2 MG/ML IJ SOLN
0.2000 mg | INTRAMUSCULAR | Status: DC | PRN
  Administered 2023-10-31: 0.2 mg via INTRAVENOUS
  Filled 2023-10-31: qty 1

## 2023-11-01 ENCOUNTER — Encounter: Payer: 59 | Admitting: Student in an Organized Health Care Education/Training Program

## 2023-11-02 NOTE — Patient Outreach (Signed)
  Care Coordination   Follow Up Visit Note   11/02/2023 Name: Alicia [name] MRN: [id number] DOB: [birth year]  Alicia [name] is a 67 y.o. year old female who sees No primary care provider on file. for primary care.  Patient was not interviewed or contacted during this encounter.   .      What matters to the patients health and wellness today?  The RN liaison has informed the RN Care Management team that the patient has passed away. I have subsequently closed my case and withdrawn from the care team.    SDOH assessments and interventions completed:  No     Care Coordination Interventions:  No, not indicated   Follow up plan: No further intervention required.   Encounter Outcome:  Patient Visit Completed   [name] RN, BSN, [facility]  [facility], [facility]  Care Coordinator Phone: [phone]

## 2023-11-08 ENCOUNTER — Ambulatory Visit (HOSPITAL_COMMUNITY): Payer: 59

## 2023-11-15 LAB — METHEMOGLOBIN, BLOOD: Methemoglobin, Blood: 1 % (ref 0.4–1.5)

## 2023-11-19 DIAGNOSIS — J9611 Chronic respiratory failure with hypoxia: Secondary | ICD-10-CM | POA: Diagnosis not present

## 2023-11-19 DIAGNOSIS — J449 Chronic obstructive pulmonary disease, unspecified: Secondary | ICD-10-CM | POA: Diagnosis not present

## 2023-11-24 NOTE — Progress Notes (Signed)
Resident paged by RN Tessa Lerner  Alicia [name] had episode of emesis this AM around 5:30. Alicia [name] was able to remove Bipap mask prior to vomiting. Following episode patient was desaturating into 70s with good waveform. Intermittently saturations in 90s with good waveforms.   Unfortunately her lung disease is end stage and not many additional therapies available. She has been febrile.  On exam she has tachypnea with increased work of breath and appears in distress. There is not good air movement in posterior lung fields.   Considered trial of lasix and starting broad spectrum antibiotics. Initially ordered interventions, then I talked to Alicia [name] about this. She is very uncomfortable with her breathing currently. Through shared decision making, decision made to transition to comfort care. Alicia [name] called patient's daughter, Alicia [name] and Alicia [name], they are both coming to hospital now.   Alicia [name] would like to go ahead and have small dose of morphine now. She asked how long process would take. If oxygen removed I anticipate she would pass away within a few hours.   Greatly appreciate nursing and respiratory help in this case.  IV morphine 1 mg q 3 hrs PRN, would increase dose. Could consider drip if symptoms remain uncontrolled.

## 2023-11-24 NOTE — Progress Notes (Signed)
HD#15 SUBJECTIVE:  Patient Summary: Patient Summary: Alicia [name] is a 68 y.o. 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    Overnight Events: Febrile, episode of nausea/vomiting, night team evaluated noted tachypnea and increased work of breathing, with shared decision making, decision made to transition to full comfort care and family was notified  Interim History: Patient was evaluated this morning at bedside with family around.  Patient denies any acute pain or worsening breathing.  Patient on HHFNC.  Discussed with patient and family regarding comfort care measures and all are in agreement. Offered support to patient and family.   OBJECTIVE:  Vital Signs: Vitals:   11/19/2023 0100 11/09/2023 0312 11/11/2023 0500 11/17/2023 0509  BP:   129/83   Pulse:  67 76 71  Resp: 20 12    Temp:   (!) 100.6 F (38.1 C)   TempSrc:   Oral   SpO2:  93% 93% (!) 76%  Weight:   93.7 kg   Height:       Supplemental O2:  HHFNC SpO2: (!) 82 % O2 Flow Rate (L/min): 60 L/min FiO2 (%): (!) 10 %  Filed Weights   10/29/23 0646 10/30/23 0533 11/07/2023 0500  Weight: 95 kg 92.9 kg 93.7 kg     Intake/Output Summary (Last 24 hours) at 11/21/2023 5366 Last data filed at 11/15/2023 4403 Gross per 24 hour  Intake 0 ml  Output 301 ml  Net -301 ml   Net IO Since Admission: -8,904.35 mL [11/15/2023 0652]  Physical Exam: General: Chronically ill-appearing female sitting up in bed, in no acute distress Cardiac: Regular rate and rhythm Pulmonary: HHFNC in place Neuro: Awake and  alert  Patient Lines/Drains/Airways Status     Active Line/Drains/Airways     Name Placement date Placement time Site Days   Peripheral IV 10/26/23 22 G 1" Right;Anterior;Proximal Forearm 10/26/23  2043  Forearm  3   External Urinary Catheter 10/29/23  0932  --  less than 1   Wound / Incision (Open or Dehisced) 10/20/23 (IAD) Incontinence Associated Dermatitis Buttocks Right;Left 10/20/23  1044  Buttocks  9             ASSESSMENT/PLAN:  Assessment: Active Problems:   Type 2 diabetes mellitus with moderate nonproliferative diabetic retinopathy (HCC)   Essential hypertension   COPD (chronic obstructive pulmonary disease) (HCC)   Hyperlipidemia   Gastroesophageal reflux disease   Obesity (BMI 30.0-34.9)   Dysuria   Nocturnal hypoxemia   AKI (acute kidney injury) (HCC)   Acute on chronic respiratory failure with hypoxia (HCC)   Acute congestive heart failure (HCC)   Acute on chronic heart failure with preserved ejection fraction (HFpEF) (HCC)  Alicia [name] is a 68 y.o. 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   Plan: #Acute on chronic hypoxic/hypercarbic respiratory failure (home O2 8L [place]) #Acute on Chronic HFmrEF (EF 45-50%) #Acute COPD exacerbation   Significant decline overnight and patient has decided to focus more on comfort.  Shared decision making to transition to comfort care.  Family notified of decision and are all at bedside.  Stopped medications that are not focused on comfort.  Continue to follow and offer support to patient and  family. -COMFORT CARE  -Appreciate palliative care team assistance -Anticipate hospital death  -Glycopyrrolate every 4 hour as needed for excessive secretions -Ativan every 4 hours as needed for anxiety -Morphine IV every 15 minutes as needed for pain/symptoms of distress -Zofran every 6 hour as needed for nausea -Liquifilm Tears as needed, oral rinse as needed   #AKI #Contraction Alkalosis  #Cardiorenal syndrome  Worsening renal function today.  Transition to comfort care.  #Leukocytosis Worsening leukocytosis up to 17.6 and noted fever overnight.  Episode of vomiting and increased work of breathing.  Transition to comfort care.  Chronic Conditions:  Type 2 Diabetes with nonproliferative diabetic retinopathy: Stopped Semglee 30 units daily with moderate SSI HLD: Stopped lipitor 80 mg daily and zetia 10 mg daily Paroxysmal A fib: Stopped Eliquis 5 mg BID HTN: Stopped home amlodipine 10 mg daily. Obesity/OHS/OSA: Wears CPAP at night, currently on BiPAP at night   Best Practice: Diet: Renal with 1200 ml fluid restriction VTE: Eliquis BID (stopped, transition to comfort care) Code: DNR/DNI DISPO: Comfort Care, anticipate hospital death  Signature: Rana Snare, DO Internal Medicine Resident PGY-2 Pager: [phone] Please contact the on-call pager after 5 pm and on weekends at [phone].  6:52 AM, 11/17/2023

## 2023-11-24 NOTE — Death Summary Note (Signed)
Name: Vyolet Zehring Sluka MRN: [id number] DOB: [birth year] 68 y.o.  Date of Admission: 10/16/2023  1:25 PM Date of Discharge: 10/25/2023 Attending Physician: Dr. 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 (HCC)   Essential hypertension   COPD (chronic obstructive pulmonary disease) (HCC)   Hyperlipidemia   Gastroesophageal reflux disease   Obesity (BMI 30.0-34.9)   Dysuria   Nocturnal hypoxemia   AKI (acute kidney injury) (HCC)   Acute on chronic respiratory failure with hypoxia (HCC)   Acute congestive heart failure (HCC)   Acute on chronic heart failure with preserved ejection fraction (HFpEF) (HCC)   Goals of care, counseling/discussion  Cause of death: Cardiopulmonary arrest Time of death: 1:05 PM  Disposition and follow-up:   Ms.Danaye M Mehring was discharged from [facility] in expired condition.    Hospital Course: Salaya Lenington Helsley is a 68 y.o. person with PMH of chronic hypoxic respiratory failure on 8 L nasal cannula at baseline, COPD, heart failure recovered ejection fraction (EF 50 to 60%), type 2 diabetes with diabetic retinopathy, SA node dysfunction, obesity, CVA, with recent hospital admission for COPD exacerbation presents with 1 week of worsening shortness of breath at baseline and fatigue, admitted for acute on chronic hypoxic respiratory failure secondary to heart failure exacerbation.  #Acute on chronic hypoxic/hypercarbic respiratory failure (home O2 8L [place]) #Acute on Chronic HFmrEF (EF 45-50%) #Acute COPD exacerbation  Presented with worsening dyspnea and fatigue for 1 week with increasing oxygen requirement. Concern for volume overload with BNP >2000 and CXR with pulmonary congestion. Started IV diuresis and also treated for acute COPD exacerbation. Over hospital course, patient needing more O2 requirement with increasing rates on HHFNC and BiPAP more frequently  to maintain O2 saturation. GOC conversations with primary team, advanced HF, PCCM and palliative medicine teams during this hospital admission. Overnight episode on 12/7-8 of vomiting and increased work of breathing. Patient appeared labored and fatigued. Shared decision making with patient and decision was made to transition to comfort care. Family notified and arrived at bedside. Comfort care measures in place. Patient died with family at bedside. Spiritual support provided and allowed time for family to grieve.   #AKI #Contraction Alkalosis  #Cardiorenal syndrome  Presented with Cr 1.13, elevated from 0.75 on 09/03/2023 likely pre-renal, in the setting of HF exacerbation. UA negative infection. Bladder scan on 11/24 showed 0 PVR. There is likely a component of cardiorenal syndrome that is playing in role in her AKI in the setting of worsening HF exacerbation. Renal function fluctuated during course with diuresis.   #Leukocytosis Worsening leukocytosis up to 17.6 and noted fever intermittently.  Overnight episode of vomiting and increased work of breathing. Patient appeared labored and discussed with team to transition to comfort care.   #Chest pain, noncardiac  #Elevated Troponin #Demand Ischemia  Reported chest pain for the past 4 days, muscular like that comes and goes, worsened with movement. Pain is reproducible upon palpation. Troponin elevated to 19, 24, low suspicion for ACS. Likely demand ischemia in the setting of volume overload. Supportive care provided.   #Goals of care conversation Extensive discussions regarding GOC with patient and patient's daughter and the progression of patient's chronic conditions. Poor prognosis given her comorbidities. After shared decision making, patient elected for DNR/DNI. After days of continued diuresis with no significant improvement in her O2 requirement and extensive workup, patient and family decided on hospice and on 12/8 after acute overnight event  patient requested to be comfort care focused. Comfort care measures were in place.    Chronic Conditions:  T2DM with nonproliferative diabetic retinopathy: Semglee 30 units daily with moderate SSI HLD: Lipitor 80 mg daily and zetia 10 mg daily Paroxysmal A fib: Eliquis 5 mg BID HTN: Amlodipine 10 mg daily Obesity/OHS/OSA: Wears CPAP at night at home, on BiPAP in hospital   Signed: Rana Snare, DO 10/28/2023, 4:07 PM

## 2023-11-24 NOTE — Progress Notes (Signed)
Pt. Was taken of bipap by RN and placed back on heated high flow due to vomiting. MD made aware by RN

## 2023-11-24 NOTE — Plan of Care (Signed)
  Problem: Coping: Goal: Ability to adjust to condition or change in health will improve Outcome: Progressing   Problem: Fluid Volume: Goal: Ability to maintain a balanced intake and output will improve Outcome: Progressing

## 2023-11-24 DEATH — deceased

## 2023-12-07 MED FILL — Medication: Qty: 1 | Status: AC

## 2023-12-31 ENCOUNTER — Other Ambulatory Visit: Payer: Self-pay | Admitting: Physician Assistant

## 2024-01-02 ENCOUNTER — Other Ambulatory Visit: Payer: Self-pay | Admitting: Student in an Organized Health Care Education/Training Program

## 2024-03-03 ENCOUNTER — Other Ambulatory Visit (HOSPITAL_BASED_OUTPATIENT_CLINIC_OR_DEPARTMENT_OTHER): Payer: Self-pay

## 2024-11-30 ENCOUNTER — Telehealth: Payer: Self-pay | Admitting: *Deleted

## 2024-11-30 NOTE — Telephone Encounter (Signed)
 Patient is deceased / unable to rescheudle patient for mammgram . Patient has stated in the past that she would make her won appointment.
